# Patient Record
Sex: Female | Born: 1950 | Race: Black or African American | Hispanic: No | Marital: Single | Smoking: Never smoker
Health system: Southern US, Community
[De-identification: ages and names within clinical notes are randomized; demographics above are authoritative.]

## PROBLEM LIST (undated history)

## (undated) ENCOUNTER — Emergency Department: Discharge status: Absent without leave | Payer: Medicare Other

## (undated) DIAGNOSIS — Z96652 Presence of left artificial knee joint: Secondary | ICD-10-CM

## (undated) DIAGNOSIS — R011 Cardiac murmur, unspecified: Secondary | ICD-10-CM

## (undated) DIAGNOSIS — IMO0002 Reserved for concepts with insufficient information to code with codable children: Secondary | ICD-10-CM

## (undated) DIAGNOSIS — K219 Gastro-esophageal reflux disease without esophagitis: Secondary | ICD-10-CM

## (undated) DIAGNOSIS — R002 Palpitations: Secondary | ICD-10-CM

## (undated) DIAGNOSIS — D491 Neoplasm of unspecified behavior of respiratory system: Secondary | ICD-10-CM

## (undated) DIAGNOSIS — G4730 Sleep apnea, unspecified: Secondary | ICD-10-CM

## (undated) DIAGNOSIS — R0681 Apnea, not elsewhere classified: Secondary | ICD-10-CM

## (undated) DIAGNOSIS — L299 Pruritus, unspecified: Secondary | ICD-10-CM

## (undated) DIAGNOSIS — Z124 Encounter for screening for malignant neoplasm of cervix: Secondary | ICD-10-CM

## (undated) DIAGNOSIS — Z021 Encounter for pre-employment examination: Secondary | ICD-10-CM

## (undated) DIAGNOSIS — F259 Schizoaffective disorder, unspecified: Secondary | ICD-10-CM

## (undated) DIAGNOSIS — R32 Unspecified urinary incontinence: Secondary | ICD-10-CM

## (undated) DIAGNOSIS — M25661 Stiffness of right knee, not elsewhere classified: Secondary | ICD-10-CM

## (undated) DIAGNOSIS — Z7401 Bed confinement status: Secondary | ICD-10-CM

## (undated) DIAGNOSIS — F419 Anxiety disorder, unspecified: Secondary | ICD-10-CM

## (undated) DIAGNOSIS — Z709 Sex counseling, unspecified: Secondary | ICD-10-CM

## (undated) DIAGNOSIS — E785 Hyperlipidemia, unspecified: Secondary | ICD-10-CM

## (undated) DIAGNOSIS — M069 Rheumatoid arthritis, unspecified: Secondary | ICD-10-CM

## (undated) DIAGNOSIS — I639 Cerebral infarction, unspecified: Secondary | ICD-10-CM

## (undated) DIAGNOSIS — R61 Generalized hyperhidrosis: Secondary | ICD-10-CM

## (undated) DIAGNOSIS — R569 Unspecified convulsions: Secondary | ICD-10-CM

## (undated) DIAGNOSIS — M797 Fibromyalgia: Secondary | ICD-10-CM

## (undated) DIAGNOSIS — I10 Essential (primary) hypertension: Secondary | ICD-10-CM

## (undated) DIAGNOSIS — M25662 Stiffness of left knee, not elsewhere classified: Secondary | ICD-10-CM

## (undated) HISTORY — PX: CYST REMOVAL HAND: SHX6279

## (undated) HISTORY — DX: Hyperlipidemia, unspecified: E78.5

## (undated) HISTORY — DX: Schizoaffective disorder, unspecified: F25.9

## (undated) HISTORY — DX: Neoplasm of unspecified behavior of respiratory system: D49.1

## (undated) HISTORY — DX: Rheumatoid arthritis, unspecified: M06.9

## (undated) HISTORY — DX: Cardiac murmur, unspecified: R01.1

## (undated) HISTORY — DX: Fibromyalgia: M79.7

---

## 1975-09-10 HISTORY — PX: LUNG LOBECTOMY: SHX167

## 2006-08-21 ENCOUNTER — Ambulatory Visit: Payer: Self-pay | Admitting: Internal Medicine

## 2006-08-26 ENCOUNTER — Ambulatory Visit: Payer: Self-pay | Admitting: Internal Medicine

## 2006-09-09 HISTORY — PX: CARDIAC CATHETERIZATION: SHX172

## 2007-11-04 ENCOUNTER — Ambulatory Visit: Payer: Self-pay | Admitting: Family Medicine

## 2008-09-05 ENCOUNTER — Emergency Department: Payer: Self-pay | Admitting: Emergency Medicine

## 2008-11-04 ENCOUNTER — Emergency Department: Payer: Self-pay | Admitting: Emergency Medicine

## 2009-01-23 ENCOUNTER — Ambulatory Visit: Payer: Self-pay | Admitting: Family Medicine

## 2009-07-01 ENCOUNTER — Emergency Department: Payer: Self-pay | Admitting: Emergency Medicine

## 2010-02-18 ENCOUNTER — Emergency Department: Payer: Self-pay | Admitting: Internal Medicine

## 2010-02-20 ENCOUNTER — Ambulatory Visit: Payer: Self-pay | Admitting: Family Medicine

## 2010-06-01 ENCOUNTER — Emergency Department: Payer: Self-pay | Admitting: Emergency Medicine

## 2010-09-09 HISTORY — PX: BRAIN TUMOR EXCISION: SHX577

## 2010-12-17 ENCOUNTER — Emergency Department: Payer: Self-pay | Admitting: Emergency Medicine

## 2010-12-20 ENCOUNTER — Inpatient Hospital Stay: Payer: Self-pay | Admitting: Psychiatry

## 2011-03-18 ENCOUNTER — Emergency Department: Payer: Self-pay | Admitting: *Deleted

## 2011-03-21 ENCOUNTER — Emergency Department: Payer: Self-pay | Admitting: Unknown Physician Specialty

## 2011-04-22 ENCOUNTER — Emergency Department: Payer: Self-pay | Admitting: Emergency Medicine

## 2011-05-17 ENCOUNTER — Emergency Department: Payer: Self-pay | Admitting: Unknown Physician Specialty

## 2011-08-06 ENCOUNTER — Inpatient Hospital Stay: Payer: Self-pay | Admitting: Psychiatry

## 2011-11-08 ENCOUNTER — Emergency Department: Payer: Self-pay | Admitting: Emergency Medicine

## 2011-11-08 LAB — COMPREHENSIVE METABOLIC PANEL
Albumin: 4.2 g/dL (ref 3.4–5.0)
Alkaline Phosphatase: 79 U/L (ref 50–136)
Anion Gap: 8 (ref 7–16)
BUN: 17 mg/dL (ref 7–18)
Bilirubin,Total: 0.3 mg/dL (ref 0.2–1.0)
Calcium, Total: 9.3 mg/dL (ref 8.5–10.1)
Chloride: 105 mmol/L (ref 98–107)
Co2: 28 mmol/L (ref 21–32)
Creatinine: 0.78 mg/dL (ref 0.60–1.30)
EGFR (African American): >60
EGFR (Non-African Amer.): >60
Glucose: 106 mg/dL — ABNORMAL HIGH (ref 65–99)
Osmolality: 283 (ref 275–301)
Potassium: 3.4 mmol/L — ABNORMAL LOW (ref 3.5–5.1)
SGOT(AST): 26 U/L (ref 15–37)
SGPT (ALT): 21 U/L
Sodium: 141 mmol/L (ref 136–145)
Total Protein: 7.9 g/dL (ref 6.4–8.2)

## 2011-11-08 LAB — CBC
HCT: 40.5 % (ref 35.0–47.0)
HGB: 13.6 g/dL (ref 12.0–16.0)
MCH: 31.2 pg (ref 26.0–34.0)
MCHC: 33.6 g/dL (ref 32.0–36.0)
MCV: 93 fL (ref 80–100)
Platelet: 268 10*3/uL (ref 150–440)
RBC: 4.36 10*6/uL (ref 3.80–5.20)
RDW: 16.2 % — ABNORMAL HIGH (ref 11.5–14.5)
WBC: 6.9 10*3/uL (ref 3.6–11.0)

## 2011-11-08 LAB — DRUG SCREEN, URINE

## 2011-11-08 LAB — URINALYSIS, COMPLETE
Bilirubin,UR: NEGATIVE
Blood: NEGATIVE
Glucose,UR: NEGATIVE mg/dL (ref 0–75)
Ketone: NEGATIVE
Leukocyte Esterase: NEGATIVE
Nitrite: NEGATIVE
Ph: 5 (ref 4.5–8.0)
Protein: NEGATIVE
RBC,UR: 2 /HPF (ref 0–5)
Specific Gravity: 1.026 (ref 1.003–1.030)
Squamous Epithelial: 1
WBC UR: <1 /HPF (ref 0–5)

## 2011-11-08 LAB — ETHANOL
Ethanol %: <0.003 % (ref 0.000–0.080)
Ethanol: <3 mg/dL

## 2011-11-08 LAB — TSH: Thyroid Stimulating Horm: 1.15 u[IU]/mL

## 2012-02-26 ENCOUNTER — Emergency Department (HOSPITAL_COMMUNITY)
Admission: EM | Admit: 2012-02-26 | Discharge: 2012-02-26 | Disposition: A | Discharge status: Home / Self Care | Payer: Self-pay | Attending: Emergency Medicine | Admitting: Emergency Medicine

## 2012-02-26 ENCOUNTER — Encounter (HOSPITAL_COMMUNITY): Payer: Self-pay

## 2012-02-26 ENCOUNTER — Emergency Department (HOSPITAL_COMMUNITY): Payer: Self-pay

## 2012-02-26 DIAGNOSIS — I10 Essential (primary) hypertension: Secondary | ICD-10-CM | POA: Insufficient documentation

## 2012-02-26 DIAGNOSIS — E119 Type 2 diabetes mellitus without complications: Secondary | ICD-10-CM | POA: Insufficient documentation

## 2012-02-26 DIAGNOSIS — R300 Dysuria: Secondary | ICD-10-CM | POA: Insufficient documentation

## 2012-02-26 HISTORY — DX: Anxiety disorder, unspecified: F41.9

## 2012-02-26 HISTORY — DX: Essential (primary) hypertension: I10

## 2012-02-26 HISTORY — DX: Reserved for concepts with insufficient information to code with codable children: IMO0002

## 2012-02-26 LAB — BASIC METABOLIC PANEL
BUN: 20 mg/dL (ref 6–23)
CO2: 26 mEq/L (ref 19–32)
Calcium: 9.8 mg/dL (ref 8.4–10.5)
Chloride: 101 mEq/L (ref 96–112)
Creatinine, Ser: 0.76 mg/dL (ref 0.50–1.10)
GFR calc Af Amer: >90 mL/min (ref >90)
GFR calc non Af Amer: >90 mL/min (ref >90)
Glucose, Bld: 102 mg/dL — ABNORMAL HIGH (ref 70–99)
Potassium: 4.1 mEq/L (ref 3.5–5.1)
Sodium: 136 mEq/L (ref 135–145)

## 2012-02-26 LAB — URINALYSIS, ROUTINE W REFLEX MICROSCOPIC
Bilirubin Urine: NEGATIVE
Glucose, UA: NEGATIVE mg/dL
Hgb urine dipstick: NEGATIVE
Ketones, ur: NEGATIVE mg/dL
Leukocytes, UA: NEGATIVE
Nitrite: NEGATIVE
Protein, ur: NEGATIVE mg/dL
Specific Gravity, Urine: 1.01 (ref 1.005–1.030)
Urobilinogen, UA: 0.2 mg/dL (ref 0.0–1.0)
pH: 6 (ref 5.0–8.0)

## 2012-02-26 NOTE — ED Notes (Signed)
Pt has not been in room since before 11 am.  Pt's medications were left in room.  Eugene Gavia attempted to call number listed on chart but was wrong number.  Took pt's medications to pharmacy, counted pills with pharmacist and left medications with pharmacist to lock up.

## 2012-02-26 NOTE — ED Notes (Signed)
Patient has been missing for about 45 minutes. RN Verlon Au aware. Medications were given to Diplomatic Services operational officer.

## 2012-02-26 NOTE — ED Notes (Signed)
Pt reports dysuria and some urinary retention for the past few days.  Pt states "i don't feel like I'm drinking enough water".

## 2012-02-26 NOTE — ED Provider Notes (Signed)
History     CSN: [id number]  Arrival date & time 02/26/12  0910   First MD Initiated Contact with Patient 02/26/12 [phone]      Chief Complaint  Patient presents with  . Dysuria  . Urinary Retention    (Consider location/radiation/quality/duration/timing/severity/associated sxs/prior treatment) HPI Comments: Lauren [name] presents for evaluation of left back pressure sensation and burning pain with urination in addition to increased urinary frequency, yet she states she is only passing small amounts of urine.  She denies fevers,  Chills,  Nausea,  Vomiting.  She also denies abdominal pain, and states her low back pain is constant and not worse with movement or palpation.  She is concerned for a possible uti, and states she may not be drinking enough fluids.  She is diabetic and does not check her cbg's on a regular basis.  Patient is a 61 y.o. female presenting with dysuria. The history is provided by the patient.  Dysuria  Pertinent negatives include no nausea.    Past Medical History  Diagnosis Date  . Hypertension   . Diabetes mellitus   . Herniated disc   . Anxiety     Past Surgical History  Procedure Date  . Brain tumor excision   . Lung lobectomy   . Cesarean section     No family history on file.  History  Substance Use Topics  . Smoking status: Never Smoker   . Smokeless tobacco: Not on file  . Alcohol Use: No    OB History    Grav Para Term Preterm Abortions TAB SAB Ect Mult Living                  Review of Systems  Constitutional: Negative for fever.  HENT: Negative for congestion, sore throat and neck pain.   Eyes: Negative.   Respiratory: Negative for chest tightness and shortness of breath.   Cardiovascular: Negative for chest pain.  Gastrointestinal: Negative for nausea and abdominal pain.  Genitourinary: Positive for dysuria. Negative for vaginal discharge.  Musculoskeletal: Positive for back pain. Negative for joint swelling and  arthralgias.  Skin: Negative.  Negative for rash and wound.  Neurological: Negative for dizziness, weakness, light-headedness, numbness and headaches.  Hematological: Negative.   Psychiatric/Behavioral: Negative.     Allergies  Percocet and Vicodin  Home Medications   Current Outpatient Rx  Name Route Sig Dispense Refill  . BC HEADACHE POWDER PO Oral Take 1 packet by mouth daily as needed. For headache and pain    . ATENOLOL 100 MG PO TABS Oral Take 100 mg by mouth daily.    [name] DICLOFENAC SODIUM 75 MG PO TBEC Oral Take 75 mg by mouth 2 (two) times daily.    [name] DIPHENHYDRAMINE HCL 25 MG PO TABS Oral Take 25 mg by mouth at bedtime as needed. For allergies    . LISINOPRIL 20 MG PO TABS Oral Take 20 mg by mouth daily.    . MELOXICAM 15 MG PO TABS Oral Take 15 mg by mouth daily.    [name] METFORMIN HCL 500 MG PO TABS Oral Take 500 mg by mouth 2 (two) times daily with a meal.    . RISPERIDONE 1 MG PO TABS Oral Take 1 mg by mouth 2 (two) times daily.    [name] METHOCARBAMOL 500 MG PO TABS Oral Take 500 mg by mouth 2 (two) times daily as needed.      BP 179/115  Pulse 71  Temp 98.2 F (36.8 C) (Oral)  Resp 18  Ht 5\' 6"  (1.676 m)  Wt 232 lb (105.235 kg)  BMI 37.45 kg/m2  SpO2 100%  Physical Exam  Nursing note and vitals reviewed. Constitutional: She appears well-developed and well-nourished.       Hypertensive.  HENT:  Head: Normocephalic and atraumatic.  Eyes: Conjunctivae are normal.  Neck: Normal range of motion.  Cardiovascular: Normal rate, regular rhythm, normal heart sounds and intact distal pulses.   Pulmonary/Chest: Effort normal and breath sounds normal. She has no wheezes.  Abdominal: Soft. Bowel sounds are normal. There is no tenderness. There is no rebound and no guarding.  Musculoskeletal: Normal range of motion. She exhibits tenderness. She exhibits no edema.       Back:  Neurological: She is alert.  Skin: Skin is warm and dry.  Psychiatric: She has a normal mood and  affect.    ED Course  Procedures (including critical care time)  Labs Reviewed  BASIC METABOLIC PANEL - Abnormal; Notable for the following:    Glucose, Bld 102 (*)     All other components within normal limits  URINALYSIS, ROUTINE W REFLEX MICROSCOPIC  URINE CULTURE   No results found.   1. Dysuria       MDM  While awaiting pt to have lumbar spine xray,  She left dept without informing anyone.  She was given results of her urinalysis,  But had not received normal bmet results.  Pt had not yet had LS spine film.        Burgess Amor, [place] 02/26/12 1708

## 2012-02-27 LAB — URINE CULTURE
Colony Count: 8000
Culture  Setup Time: 201306200128

## 2012-02-27 NOTE — ED Provider Notes (Signed)
Medical screening examination/treatment/procedure(s) were performed by non-physician practitioner and as supervising physician I was immediately available for consultation/collaboration.   Joya Gaskins, MD 02/27/12 [phone]

## 2012-06-04 ENCOUNTER — Emergency Department: Payer: Self-pay | Admitting: Emergency Medicine

## 2012-06-05 LAB — COMPREHENSIVE METABOLIC PANEL
Albumin: 3.2 g/dL — ABNORMAL LOW (ref 3.4–5.0)
Alkaline Phosphatase: 107 U/L (ref 50–136)
Anion Gap: 11 (ref 7–16)
BUN: 9 mg/dL (ref 7–18)
Bilirubin,Total: 0.2 mg/dL (ref 0.2–1.0)
Calcium, Total: 8.6 mg/dL (ref 8.5–10.1)
Chloride: 104 mmol/L (ref 98–107)
Co2: 26 mmol/L (ref 21–32)
Creatinine: 0.91 mg/dL (ref 0.60–1.30)
EGFR (African American): >60
EGFR (Non-African Amer.): >60
Glucose: 156 mg/dL — ABNORMAL HIGH (ref 65–99)
Osmolality: 283 (ref 275–301)
Potassium: 3.6 mmol/L (ref 3.5–5.1)
SGOT(AST): 31 U/L (ref 15–37)
SGPT (ALT): 35 U/L (ref 12–78)
Sodium: 141 mmol/L (ref 136–145)
Total Protein: 7.2 g/dL (ref 6.4–8.2)

## 2012-06-05 LAB — DRUG SCREEN, URINE

## 2012-06-05 LAB — CBC WITH DIFFERENTIAL/PLATELET
Basophil #: 0.1 10*3/uL (ref 0.0–0.1)
Basophil %: 1 %
Eosinophil #: 0.1 10*3/uL (ref 0.0–0.7)
Eosinophil %: 1.9 %
HCT: 35.1 % (ref 35.0–47.0)
HGB: 12.2 g/dL (ref 12.0–16.0)
Lymphocyte #: 2.7 10*3/uL (ref 1.0–3.6)
Lymphocyte %: 36.4 %
MCH: 32.9 pg (ref 26.0–34.0)
MCHC: 34.9 g/dL (ref 32.0–36.0)
MCV: 94 fL (ref 80–100)
Monocyte #: 0.6 x10 3/mm (ref 0.2–0.9)
Monocyte %: 8.5 %
Neutrophil #: 3.9 10*3/uL (ref 1.4–6.5)
Neutrophil %: 52.2 %
Platelet: 309 10*3/uL (ref 150–440)
RBC: 3.72 10*6/uL — ABNORMAL LOW (ref 3.80–5.20)
RDW: 14.1 % (ref 11.5–14.5)
WBC: 7.4 10*3/uL (ref 3.6–11.0)

## 2013-08-25 ENCOUNTER — Ambulatory Visit: Payer: Self-pay | Admitting: Internal Medicine

## 2013-12-09 ENCOUNTER — Ambulatory Visit: Payer: Self-pay | Admitting: Unknown Physician Specialty

## 2013-12-10 LAB — PATHOLOGY REPORT

## 2014-01-24 ENCOUNTER — Ambulatory Visit: Payer: Self-pay | Admitting: Obstetrics & Gynecology

## 2014-01-24 LAB — CBC
HCT: 39.9 % (ref 35.0–47.0)
HGB: 13.2 g/dL (ref 12.0–16.0)
MCH: 31.7 pg (ref 26.0–34.0)
MCHC: 33 g/dL (ref 32.0–36.0)
MCV: 96 fL (ref 80–100)
Platelet: 254 10*3/uL (ref 150–440)
RBC: 4.16 10*6/uL (ref 3.80–5.20)
RDW: 14.7 % — ABNORMAL HIGH (ref 11.5–14.5)
WBC: 6.2 10*3/uL (ref 3.6–11.0)

## 2014-01-24 LAB — BASIC METABOLIC PANEL
Anion Gap: 5 — ABNORMAL LOW (ref 7–16)
BUN: 13 mg/dL (ref 7–18)
Calcium, Total: 9.2 mg/dL (ref 8.5–10.1)
Chloride: 100 mmol/L (ref 98–107)
Co2: 32 mmol/L (ref 21–32)
Creatinine: 0.68 mg/dL (ref 0.60–1.30)
EGFR (African American): >60
EGFR (Non-African Amer.): >60
Glucose: 88 mg/dL (ref 65–99)
Osmolality: 273 (ref 275–301)
Potassium: 3.6 mmol/L (ref 3.5–5.1)
Sodium: 137 mmol/L (ref 136–145)

## 2014-01-24 LAB — PROTIME-INR
INR: 1
Prothrombin Time: 12.7 secs (ref 11.5–14.7)

## 2014-01-24 LAB — APTT: Activated PTT: 31.8 secs (ref 23.6–35.9)

## 2014-02-02 DIAGNOSIS — R002 Palpitations: Secondary | ICD-10-CM | POA: Insufficient documentation

## 2014-02-02 DIAGNOSIS — G4730 Sleep apnea, unspecified: Secondary | ICD-10-CM

## 2014-02-02 DIAGNOSIS — R0681 Apnea, not elsewhere classified: Secondary | ICD-10-CM

## 2014-02-02 DIAGNOSIS — G4733 Obstructive sleep apnea (adult) (pediatric): Secondary | ICD-10-CM | POA: Insufficient documentation

## 2014-02-02 HISTORY — DX: Palpitations: R00.2

## 2014-02-02 HISTORY — DX: Apnea, not elsewhere classified: R06.81

## 2014-02-02 HISTORY — DX: Sleep apnea, unspecified: G47.30

## 2014-02-03 ENCOUNTER — Ambulatory Visit: Payer: Self-pay | Admitting: Obstetrics & Gynecology

## 2014-02-04 LAB — PATHOLOGY REPORT

## 2014-12-06 DIAGNOSIS — G4700 Insomnia, unspecified: Secondary | ICD-10-CM | POA: Diagnosis not present

## 2014-12-06 DIAGNOSIS — G4730 Sleep apnea, unspecified: Secondary | ICD-10-CM | POA: Diagnosis not present

## 2014-12-06 DIAGNOSIS — E785 Hyperlipidemia, unspecified: Secondary | ICD-10-CM | POA: Diagnosis not present

## 2014-12-06 DIAGNOSIS — R7309 Other abnormal glucose: Secondary | ICD-10-CM | POA: Diagnosis not present

## 2014-12-06 DIAGNOSIS — I10 Essential (primary) hypertension: Secondary | ICD-10-CM | POA: Diagnosis not present

## 2014-12-07 DIAGNOSIS — G4733 Obstructive sleep apnea (adult) (pediatric): Secondary | ICD-10-CM | POA: Diagnosis not present

## 2014-12-09 DIAGNOSIS — G4733 Obstructive sleep apnea (adult) (pediatric): Secondary | ICD-10-CM | POA: Diagnosis not present

## 2014-12-31 NOTE — Op Note (Signed)
PATIENT NAME:  Lauren [name], Lauren [name] MR#:  [id number] DATE OF BIRTH:  01/16/1951  DATE OF PROCEDURE:  02/03/2014  PREOPERATIVE DIAGNOSIS: Postmenopausal bleeding and abnormal endometrium.  POSTOPERATIVE DIAGNOSIS: Postmenopausal bleeding and abnormal endometrium with polyp.   PROCEDURE: Hysteroscopy, dilation and curettage with polypectomy.   SURGEON: Glean Salen, MD  ANESTHESIA: General.   ESTIMATED BLOOD LOSS: Minimal.   COMPLICATIONS: None.   FINDINGS: There is a polyp in the lower uterine segment to cervix. The endometrium was otherwise thin and atrophic. Bilateral ostia are visualized.   SPECIMEN: Endocervical curettage, endometrial curettage and polyp specimens.   DISPOSITION: To recovery room in stable condition.   TECHNIQUE: The patient is prepped and draped in the usual sterile fashion after adequate anesthesia is obtained in the dorsal lithotomy position. Bladder is drained with a Robinson catheter. Speculum is placed, and the anterior lip of the cervix is grasped with a tenaculum. Endocervical curettage is performed with a Kevorkian curette. The uterus is sounded to 5 cm with a uterine sound. The cervix is easily dilated with Kennon Rounds dilators. A 30-degree hysteroscope with saline distention in the intrauterine cavity is performed, with the above-mentioned findings noted. The hysteroscope is removed, with minimal discrepancy of fluid. A polypectomy forceps is inserted, with removal of the polyp. A curettage of the endometrium was performed with a banjo curette. Excellent hemostasis noted, including after the removal of the tenaculum. The patient goes to recovery room in stable condition. All sponge, instrument and needle counts are correct.   ____________________________ R. Barnett Applebaum, MD rph:lb D: 02/03/2014 08:07:27 ET T: 02/03/2014 08:33:35 ET JOB#: [id number]  cc: Glean Salen, MD, <Dictator> Gae Dry MD ELECTRONICALLY SIGNED 02/03/2014 15:23

## 2015-01-01 NOTE — Consult Note (Signed)
Brief Consult Note: Diagnosis: schizophrenia.   Patient was seen by consultant.   Consult note dictated.   Discussed with Attending MD.   Comments: Psychiatry: Patient seen. Chart reviewed. Patient has schizophrenia and has some odd delusions about bodily sensations but she is in no way threatening and denies suicidal ideation. Currently calm. Agrees to continue her medication. Has a place to live and good outpt support. Currently does not appear dangerous and not commitable. Will release IVC and confirm outpt followup.  Electronic Signatures: Gonzella Lex (MD)  (Signed 01-Mar-13 13:07)  Authored: Brief Consult Note   Last Updated: 01-Mar-13 13:07 by Gonzella Lex (MD)
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
## 2015-01-18 DIAGNOSIS — G4733 Obstructive sleep apnea (adult) (pediatric): Secondary | ICD-10-CM | POA: Diagnosis not present

## 2015-03-08 ENCOUNTER — Ambulatory Visit: Payer: Medicare Other | Admitting: Family Medicine

## 2015-03-21 ENCOUNTER — Ambulatory Visit (INDEPENDENT_AMBULATORY_CARE_PROVIDER_SITE_OTHER): Payer: Medicare Other | Admitting: Family Medicine

## 2015-03-21 ENCOUNTER — Encounter: Payer: Self-pay | Admitting: Family Medicine

## 2015-03-21 VITALS — BP 135/77 | HR 77 | Temp 98.7°F | Resp 17 | Ht 62.0 in | Wt 273.1 lb

## 2015-03-21 DIAGNOSIS — G4700 Insomnia, unspecified: Secondary | ICD-10-CM | POA: Diagnosis not present

## 2015-03-21 DIAGNOSIS — G4730 Sleep apnea, unspecified: Secondary | ICD-10-CM | POA: Diagnosis not present

## 2015-03-21 DIAGNOSIS — I10 Essential (primary) hypertension: Secondary | ICD-10-CM | POA: Insufficient documentation

## 2015-03-21 DIAGNOSIS — E785 Hyperlipidemia, unspecified: Secondary | ICD-10-CM | POA: Insufficient documentation

## 2015-03-21 DIAGNOSIS — N3941 Urge incontinence: Secondary | ICD-10-CM | POA: Insufficient documentation

## 2015-03-21 DIAGNOSIS — F419 Anxiety disorder, unspecified: Secondary | ICD-10-CM | POA: Insufficient documentation

## 2015-03-21 NOTE — Progress Notes (Signed)
Name: Lauren [name]   MRN: [id number]    DOB: 1951-08-11   Date:03/21/2015       Progress Note  Subjective  Chief Complaint  Chief Complaint  Patient presents with  . Follow-up    Paperwork C-PAP  . Hypertension  . Hyperlipidemia    HPI Pt. here to reauthorize the continuation of CPAP supplies. She is using CPAP for sleep apnea. She reports using the CPAP every night and reports improvement in her energy level. When she does not use the CPAP, she wakes up gasping for air and has noticed fluttering of her heart, which does not happen when she is using the CPAP. SHe was diagnosed with sleep apnea in February/March 2016.   Past Medical History  Diagnosis Date  . Hypertension   . Diabetes mellitus   . Herniated disc   . Anxiety     Past Surgical History  Procedure Laterality Date  . Brain tumor excision    . Lung lobectomy    . Cesarean section      History reviewed. No pertinent family history.  History   Social History  . Marital Status: Single    Spouse Name: N/A  . Number of Children: N/A  . Years of Education: N/A   Occupational History  . Not on file.   Social History Main Topics  . Smoking status: Never Smoker   . Smokeless tobacco: Not on file  . Alcohol Use: No  . Drug Use: No  . Sexual Activity: Not on file   Other Topics Concern  . Not on file   Social History Narrative     Current outpatient prescriptions:  .  Aspirin-Salicylamide-Caffeine (BC HEADACHE POWDER PO), Take 1 packet by mouth daily as needed. For headache and pain, Disp: , Rfl:  .  atenolol (TENORMIN) 100 MG tablet, Take 100 mg by mouth daily., Disp: , Rfl:  .  atorvastatin (LIPITOR) 20 MG tablet, Take 1 tablet by mouth at bedtime., Disp: , Rfl:  .  haloperidol (HALDOL) 2 MG tablet, Take 1 tablet by mouth at bedtime., Disp: , Rfl:  .  lisinopril-hydrochlorothiazide (PRINZIDE,ZESTORETIC) 20-25 MG per tablet, Take 1 tablet by mouth daily., Disp: , Rfl:  .  Melatonin 10 MG TABS,  Take 1 tablet by mouth as needed., Disp: , Rfl:  .  omeprazole (PRILOSEC) 20 MG capsule, Take 1 capsule by mouth daily., Disp: , Rfl:   Allergies  Allergen Reactions  . Percocet [Oxycodone-Acetaminophen] Nausea And Vomiting, Diarrhea and Nausea Only  . Tramadol Hcl Diarrhea, Nausea Only and Nausea And Vomiting  . Vicodin [Hydrocodone-Acetaminophen] Nausea And Vomiting, Diarrhea and Nausea Only     Review of Systems  Constitutional: Negative for malaise/fatigue.  Respiratory: Positive for shortness of breath. Negative for cough.   Cardiovascular: Negative for chest pain.  Gastrointestinal: Negative for abdominal pain.  Neurological: Negative for headaches.     Objective  Filed Vitals:   03/21/15 1356  BP: 135/77  Pulse: 77  Temp: 98.7 F (37.1 C)  TempSrc: Oral  Resp: 17  Height: 5\' 2"  (1.575 m)  Weight: 273 lb 1.6 oz (123.877 kg)  SpO2: 94%    Physical Exam  Constitutional: She is oriented to person, place, and time and well-developed, well-nourished, and in no distress. Vital signs are normal.  HENT:  Head: Normocephalic and atraumatic.  Cardiovascular: Normal rate and regular rhythm.   Pulmonary/Chest: Effort normal and breath sounds normal.  Neurological: She is alert and oriented to person, place, and time.  Skin: Skin is warm.  Psychiatric: Affect normal.  Nursing note and vitals reviewed.    Assessment & Plan 1. Insomnia w/ sleep apnea Pt. Is reportedly using the CPAP every night and has noticed significant improvement in her sleep quality (is sleeping 6hrs a night on average vs 4 hrs prior to CPAP) and in her energy level. Recertification paperwork completed and signed.   Nikita Humble Asad A. [facility] 03/21/2015 2:22 PM

## 2015-04-04 ENCOUNTER — Telehealth: Payer: Self-pay | Admitting: Family Medicine

## 2015-04-04 ENCOUNTER — Other Ambulatory Visit: Payer: Self-pay | Admitting: Family Medicine

## 2015-04-04 MED ORDER — ATENOLOL 100 MG PO TABS: 100.0000 mg | ORAL_TABLET | Freq: Every day | ORAL | Status: DC

## 2015-04-04 MED ORDER — LISINOPRIL-HYDROCHLOROTHIAZIDE 20-25 MG PO TABS: 1.0000 | ORAL_TABLET | Freq: Every day | ORAL | Status: DC

## 2015-04-04 MED ORDER — ATORVASTATIN CALCIUM 20 MG PO TABS: 20.0000 mg | ORAL_TABLET | Freq: Every day | ORAL | Status: DC

## 2015-04-04 MED ORDER — OMEPRAZOLE 20 MG PO CPDR: 20.0000 mg | DELAYED_RELEASE_CAPSULE | Freq: Every day | ORAL | Status: DC

## 2015-04-04 NOTE — Telephone Encounter (Signed)
PT SAID THAT SHE NEEDS REFILL ON BLOOD PRESSURE, STOMACH (ACID REFLUX), CHOLESTEROL MEDS . PHARM IS [facility]. DR TOLD HER TO JUST CALL us TO REFILL.

## 2015-04-04 NOTE — Telephone Encounter (Signed)
Medication has been refilled and sent to [facility]

## 2015-04-04 NOTE — Telephone Encounter (Signed)
Medication has been refilled and sent to [facility].

## 2015-06-14 DIAGNOSIS — G4733 Obstructive sleep apnea (adult) (pediatric): Secondary | ICD-10-CM | POA: Diagnosis not present

## 2015-06-16 ENCOUNTER — Ambulatory Visit: Payer: Medicare Other | Admitting: Family Medicine

## 2015-06-21 ENCOUNTER — Ambulatory Visit: Payer: Medicare Other | Admitting: Family Medicine

## 2015-06-22 ENCOUNTER — Ambulatory Visit
Admission: RE | Admit: 2015-06-22 | Discharge: 2015-06-22 | Disposition: A | Discharge status: Home / Self Care | Payer: Medicare Other | Source: Ambulatory Visit | Attending: Family Medicine | Admitting: Family Medicine

## 2015-06-22 ENCOUNTER — Encounter: Payer: Self-pay | Admitting: Family Medicine

## 2015-06-22 ENCOUNTER — Ambulatory Visit (INDEPENDENT_AMBULATORY_CARE_PROVIDER_SITE_OTHER): Payer: Medicare Other | Admitting: Family Medicine

## 2015-06-22 VITALS — BP 136/78 | HR 78 | Temp 98.3°F | Resp 19 | Ht 62.0 in | Wt 275.0 lb

## 2015-06-22 DIAGNOSIS — M25661 Stiffness of right knee, not elsewhere classified: Secondary | ICD-10-CM

## 2015-06-22 DIAGNOSIS — M25662 Stiffness of left knee, not elsewhere classified: Secondary | ICD-10-CM

## 2015-06-22 DIAGNOSIS — M25562 Pain in left knee: Secondary | ICD-10-CM | POA: Insufficient documentation

## 2015-06-22 DIAGNOSIS — Z23 Encounter for immunization: Secondary | ICD-10-CM | POA: Diagnosis not present

## 2015-06-22 DIAGNOSIS — M25462 Effusion, left knee: Secondary | ICD-10-CM | POA: Diagnosis not present

## 2015-06-22 DIAGNOSIS — M25561 Pain in right knee: Secondary | ICD-10-CM | POA: Insufficient documentation

## 2015-06-22 DIAGNOSIS — M179 Osteoarthritis of knee, unspecified: Secondary | ICD-10-CM | POA: Diagnosis not present

## 2015-06-22 DIAGNOSIS — M1711 Unilateral primary osteoarthritis, right knee: Secondary | ICD-10-CM

## 2015-06-22 HISTORY — DX: Stiffness of right knee, not elsewhere classified: M25.661

## 2015-06-22 HISTORY — DX: Stiffness of left knee, not elsewhere classified: M25.662

## 2015-06-22 NOTE — Progress Notes (Signed)
Name: Lauren [name]   MRN: [id number]    DOB: [birth year]   Date:06/22/2015       Progress Note  Subjective  Chief Complaint  Chief Complaint  Patient presents with  . Follow-up    Medication / Paperwork / Knees  . Hyperlipidemia  . Anxiety  . Hypertension   Knee Pain  There was no injury mechanism. The pain is present in the right knee and left knee. The pain is at a severity of 5/10. The pain is moderate. Pertinent negatives include no inability to bear weight or loss of motion. Associated symptoms comments: Pt. States that at certain times, 'my legs give out on me' and 'I find it hard to climb steps'. She reports no foreign bodies present. She has tried NSAIDs for the symptoms. The treatment provided moderate relief.   Past Medical History  Diagnosis Date  . Hypertension   . Diabetes mellitus   . Herniated disc   . Anxiety     Past Surgical History  Procedure Laterality Date  . Brain tumor excision    . Lung lobectomy    . Cesarean section      History reviewed. No pertinent family history.  Social History   Social History  . Marital Status: Single    Spouse Name: N/A  . Number of Children: N/A  . Years of Education: N/A   Occupational History  . Not on file.   Social History Main Topics  . Smoking status: Never Smoker   . Smokeless tobacco: Not on file  . Alcohol Use: No  . Drug Use: No  . Sexual Activity: Not on file   Other Topics Concern  . Not on file   Social History Narrative    Current outpatient prescriptions:  .  aspirin EC 81 MG tablet, Take by mouth., Disp: , Rfl:  .  Aspirin-Salicylamide-Caffeine (BC HEADACHE POWDER PO), Take 1 packet by mouth daily as needed. For headache and pain, Disp: , Rfl:  .  atenolol (TENORMIN) 100 MG tablet, Take 1 tablet (100 mg total) by mouth daily., Disp: 90 tablet, Rfl: 0 .  atorvastatin (LIPITOR) 20 MG tablet, Take 1 tablet (20 mg total) by mouth at bedtime., Disp: 90 tablet, Rfl: 0 .  haloperidol  (HALDOL) 2 MG tablet, Take 1 tablet by mouth at bedtime., Disp: , Rfl:  .  lisinopril-hydrochlorothiazide (PRINZIDE,ZESTORETIC) 20-25 MG per tablet, Take 1 tablet by mouth daily., Disp: 90 tablet, Rfl: 0 .  Melatonin 10 MG TABS, Take 1 tablet by mouth as needed., Disp: , Rfl:  .  omeprazole (PRILOSEC) 20 MG capsule, Take 1 capsule (20 mg total) by mouth daily., Disp: 90 capsule, Rfl: 0  Allergies  Allergen Reactions  . Percocet [Oxycodone-Acetaminophen] Nausea And Vomiting, Diarrhea and Nausea Only  . Tramadol Hcl Diarrhea, Nausea Only and Nausea And Vomiting  . Vicodin [Hydrocodone-Acetaminophen] Nausea And Vomiting, Diarrhea and Nausea Only   Review of Systems  Constitutional: Positive for chills.  Musculoskeletal: Positive for joint pain.    Objective  Filed Vitals:   06/22/15 1101  BP: 136/78  Pulse: 78  Temp: 98.3 F (36.8 C)  TempSrc: Oral  Resp: 19  Height: 5\' 2"  (1.575 m)  Weight: 275 lb (124.739 kg)  SpO2: 93%    Physical Exam  Constitutional: She is well-developed, well-nourished, and in no distress.  Musculoskeletal:       Right knee: She exhibits normal range of motion and no swelling. No tenderness found.  Left knee: She exhibits normal range of motion and no swelling. No tenderness found.  Nursing note and vitals reviewed.  Assessment & Plan  1. Need for immunization against influenza  - Flu Vaccine QUAD 36+ mos PF IM (Fluarix & Fluzone Quad PF)  2. Stiffness of both knees Suspect osteoarthritis. Will obtain x-rays and consider referring to orthopedics. - DG Knee Complete 4 Views Right; Future - DG Knee Complete 4 Views Left; Future   Cheyene Hamric Asad A. [facility] 06/22/2015 11:58 AM

## 2015-06-28 ENCOUNTER — Other Ambulatory Visit: Payer: Self-pay | Admitting: Family Medicine

## 2015-06-28 DIAGNOSIS — M170 Bilateral primary osteoarthritis of knee: Secondary | ICD-10-CM

## 2015-06-28 DIAGNOSIS — M179 Osteoarthritis of knee, unspecified: Secondary | ICD-10-CM | POA: Insufficient documentation

## 2015-06-28 DIAGNOSIS — M1710 Unilateral primary osteoarthritis, unspecified knee: Secondary | ICD-10-CM | POA: Insufficient documentation

## 2015-06-29 ENCOUNTER — Ambulatory Visit (INDEPENDENT_AMBULATORY_CARE_PROVIDER_SITE_OTHER): Payer: Medicare Other | Admitting: Family Medicine

## 2015-06-29 ENCOUNTER — Encounter: Payer: Self-pay | Admitting: Family Medicine

## 2015-06-29 VITALS — BP 134/78 | HR 78 | Temp 98.5°F | Resp 18 | Wt 268.5 lb

## 2015-06-29 DIAGNOSIS — F200 Paranoid schizophrenia: Secondary | ICD-10-CM | POA: Diagnosis not present

## 2015-06-29 DIAGNOSIS — Z021 Encounter for pre-employment examination: Secondary | ICD-10-CM

## 2015-06-29 DIAGNOSIS — K219 Gastro-esophageal reflux disease without esophagitis: Secondary | ICD-10-CM | POA: Diagnosis not present

## 2015-06-29 DIAGNOSIS — R011 Cardiac murmur, unspecified: Secondary | ICD-10-CM

## 2015-06-29 DIAGNOSIS — I10 Essential (primary) hypertension: Secondary | ICD-10-CM

## 2015-06-29 DIAGNOSIS — E785 Hyperlipidemia, unspecified: Secondary | ICD-10-CM | POA: Diagnosis not present

## 2015-06-29 DIAGNOSIS — E1169 Type 2 diabetes mellitus with other specified complication: Secondary | ICD-10-CM | POA: Insufficient documentation

## 2015-06-29 DIAGNOSIS — E119 Type 2 diabetes mellitus without complications: Secondary | ICD-10-CM | POA: Insufficient documentation

## 2015-06-29 DIAGNOSIS — R7303 Prediabetes: Secondary | ICD-10-CM | POA: Diagnosis not present

## 2015-06-29 HISTORY — DX: Encounter for pre-employment examination: Z02.1

## 2015-06-29 LAB — GLUCOSE, POCT (MANUAL RESULT ENTRY): POC Glucose: 120 mg/dl — AB (ref 70–99)

## 2015-06-29 LAB — POCT GLYCOSYLATED HEMOGLOBIN (HGB A1C): Hemoglobin A1C: 6.3

## 2015-06-29 MED ORDER — HALOPERIDOL 2 MG PO TABS: 4.0000 mg | ORAL_TABLET | Freq: Every day | ORAL | Status: DC

## 2015-06-29 MED ORDER — OMEPRAZOLE 20 MG PO CPDR: 20.0000 mg | DELAYED_RELEASE_CAPSULE | Freq: Every day | ORAL | Status: DC

## 2015-06-29 MED ORDER — ATORVASTATIN CALCIUM 20 MG PO TABS
20.0000 mg | ORAL_TABLET | Freq: Every day | ORAL | Status: DC
Start: 2015-06-29 — End: 2015-08-17

## 2015-06-29 MED ORDER — ATENOLOL 100 MG PO TABS: 100.0000 mg | ORAL_TABLET | Freq: Every day | ORAL | Status: DC

## 2015-06-29 MED ORDER — LISINOPRIL-HYDROCHLOROTHIAZIDE 20-25 MG PO TABS: 1.0000 | ORAL_TABLET | Freq: Every day | ORAL | Status: DC

## 2015-06-29 NOTE — Progress Notes (Signed)
Name: Lauren [name]   MRN: [id number]    DOB: [birth year]   Date:06/29/2015       Progress Note  Subjective  Chief Complaint  Chief Complaint  Patient presents with  . Knee Pain    patient is here for bilateral knee pain follow-up.  . Labs Only    patient has not eaten any breakfast but she has had a mint.  . Excessive Sweating    patient stated that she has been doing a lot of sweating recently.  . Medication Refill    patient is requesting a refill of her Haldol, 90 day refill since her psychiatrist is now out of network.    Hypertension This is a chronic problem. The problem is controlled. Associated symptoms include shortness of breath (with exertion). Pertinent negatives include no blurred vision, chest pain, headaches or palpitations. Past treatments include beta blockers, ACE inhibitors and diuretics. There is no history of kidney disease, CAD/MI or CVA. Identifiable causes of hypertension include sleep apnea (hx of sleep apnea, uses a CPAP).  Hyperlipidemia This is a chronic problem. The problem is controlled. Recent lipid tests were reviewed and are normal. Associated symptoms include shortness of breath (with exertion). Pertinent negatives include no chest pain, leg pain or myalgias (sometimes have a'twitch in the leg'). Current antihyperlipidemic treatment includes statins.  Heartburn She complains of heartburn. She reports no abdominal pain, no chest pain, no coughing, no dysphagia or no nausea. This is a chronic problem. The problem has been unchanged. The symptoms are aggravated by certain foods (mainly greasy foods.). Pertinent negatives include no weight loss. Risk factors include obesity. She has tried a PPI for the symptoms.   Pt. Is here for follow up and medication refill for Haldol. She has been diagnosed with Paranoid Schizophrenia and was under the care of Dr. Randel Books (Psychiatrist). She can no longer see him because he is apparently not on the [facility]  list of participating providers. She has been referred to [facility] but has not obtained an appointment yet. She is requesting refill for Haloperidol until she establishes with her Psychiatrist.  Pt. Is also requesting completion of paperwork related to her job. 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 The company apparently lost the contract with [employer] and she is now seeking employment with [employer] and needs her form completed. She works as a Quarry manager (responsibilities include assistance with ADLs including grooming, nail filing, bathing, clothing, dressing, laundry and cleaning services, and medication reminders). At her last appointment, she was asked to have her complete job description and employers' contact information provided to [place] by her employer but so far, we have not received anything. She is requesting the completion of form certifying her medical fitness to do her job.   Past Medical History  Diagnosis Date  . Hypertension   . Diabetes mellitus   . Herniated disc   . Anxiety     Past Surgical History  Procedure Laterality Date  . Brain tumor excision    . Lung lobectomy    . Cesarean section      History reviewed. No pertinent family history.  Social History   Social History  . Marital Status: Single    Spouse Name: N/A  . Number of Children: N/A  . Years of Education: N/A   Occupational History  . Not on file.   Social History Main Topics  . Smoking status: Never Smoker   .  Smokeless tobacco: Not on file  . Alcohol Use: No  . Drug Use: No  . Sexual Activity: Not on file   Other Topics Concern  . Not on file   Social History Narrative     Current outpatient prescriptions:  .  aspirin EC 81 MG tablet, Take by mouth., Disp: , Rfl:  .  atenolol (TENORMIN) 100 MG tablet, Take 1 tablet (100 mg total) by mouth daily., Disp: 90 tablet, Rfl: 0 .  atorvastatin (LIPITOR) 20 MG tablet, Take 1  tablet (20 mg total) by mouth at bedtime., Disp: 90 tablet, Rfl: 0 .  ferrous fumarate (HEMOCYTE - 106 MG FE) 325 (106 FE) MG TABS tablet, Take 1 tablet by mouth., Disp: , Rfl:  .  haloperidol (HALDOL) 2 MG tablet, Take 1 tablet by mouth at bedtime., Disp: , Rfl:  .  lisinopril-hydrochlorothiazide (PRINZIDE,ZESTORETIC) 20-25 MG per tablet, Take 1 tablet by mouth daily., Disp: 90 tablet, Rfl: 0 .  Melatonin 10 MG TABS, Take 1 tablet by mouth as needed., Disp: , Rfl:  .  Multiple Vitamins-Minerals (MULTIVITAMIN WOMEN 50+) TABS, Take by mouth., Disp: , Rfl:  .  omeprazole (PRILOSEC) 20 MG capsule, Take 1 capsule (20 mg total) by mouth daily., Disp: 90 capsule, Rfl: 0 .  Aspirin-Salicylamide-Caffeine (BC HEADACHE POWDER PO), Take 1 packet by mouth daily as needed. For headache and pain, Disp: , Rfl:   Allergies  Allergen Reactions  . Percocet [Oxycodone-Acetaminophen] Nausea And Vomiting, Diarrhea and Nausea Only  . Tramadol Hcl Diarrhea, Nausea Only and Nausea And Vomiting  . Vicodin [Hydrocodone-Acetaminophen] Nausea And Vomiting, Diarrhea and Nausea Only   Review of Systems  Constitutional: Negative for fever, chills and weight loss.  Eyes: Negative for blurred vision.  Respiratory: Positive for shortness of breath (with exertion). Negative for cough.   Cardiovascular: Negative for chest pain and palpitations.  Gastrointestinal: Positive for heartburn. Negative for dysphagia, nausea and abdominal pain.  Musculoskeletal: Positive for joint pain. Negative for myalgias (sometimes have a'twitch in the leg').  Neurological: Negative for headaches.  Psychiatric/Behavioral: Positive for depression. The patient is nervous/anxious and has insomnia.     Objective  Filed Vitals:   06/29/15 1050  BP: 134/78  Pulse: 78  Temp: 98.5 F (36.9 C)  TempSrc: Oral  Resp: 18  Weight: 268 lb 8 oz (121.791 kg)  SpO2: 95%    Physical Exam  Constitutional: She is well-developed, well-nourished, and in  no distress.  HENT:  Head: Normocephalic and atraumatic.  Cardiovascular: Normal rate and regular rhythm.   Murmur heard.  Systolic murmur is present with a grade of 2/6  Pulmonary/Chest: Effort normal and breath sounds normal. No respiratory distress. She has no rales.  Abdominal: Soft. Bowel sounds are normal. There is no tenderness.  Neurological: She is alert.  Psychiatric: Memory and affect normal. Her mood appears anxious.  Nursing note and vitals reviewed.   Assessment & Plan  1. Essential hypertension  - atenolol (TENORMIN) 100 MG tablet; Take 1 tablet (100 mg total) by mouth daily.  Dispense: 90 tablet; Refill: 0 - lisinopril-hydrochlorothiazide (PRINZIDE,ZESTORETIC) 20-25 MG tablet; Take 1 tablet by mouth daily.  Dispense: 90 tablet; Refill: 0  2. Paranoid schizophrenia ([place]) We will refill haloperidol and patient is going to establish with psychiatry - haloperidol (HALDOL) 2 MG tablet; Take 2 tablets (4 mg total) by mouth at bedtime.  Dispense: 180 tablet; Refill: 0  3. HLD (hyperlipidemia)  - atorvastatin (LIPITOR) 20 MG tablet; Take 1 tablet (20 mg total)  by mouth at bedtime.  Dispense: 90 tablet; Refill: 0 - Lipid Profile - Comprehensive Metabolic Panel (CMET)  4. Encounter for pre-employment examination Request paperwork regarding patient's job description from her employer and would also like to discuss her physical, mental, emotional health before completing the paperwork. Verbal consent obtained from patient to discuss her diagnoses with the employer.  5. Gastroesophageal reflux disease, esophagitis presence not specified  - omeprazole (PRILOSEC) 20 MG capsule; Take 1 capsule (20 mg total) by mouth daily.  Dispense: 90 capsule; Refill: 0  6. Borderline diabetes  - POCT HgB A1C - POCT Glucose (CBG)  7. Cardiac murmur  - Ambulatory referral to Cardiology   [facility] 06/29/2015 11:04 AM

## 2015-06-30 LAB — COMPREHENSIVE METABOLIC PANEL
ALT: 20 IU/L (ref 0–32)
AST: 23 IU/L (ref 0–40)
Albumin/Globulin Ratio: 1.6 (ref 1.1–2.5)
Albumin: 4.4 g/dL (ref 3.6–4.8)
Alkaline Phosphatase: 82 IU/L (ref 39–117)
BUN/Creatinine Ratio: 19 (ref 11–26)
BUN: 13 mg/dL (ref 8–27)
Bilirubin Total: 0.5 mg/dL (ref 0.0–1.2)
CO2: 21 mmol/L (ref 18–29)
Calcium: 9.9 mg/dL (ref 8.7–10.3)
Chloride: 100 mmol/L (ref 97–106)
Creatinine, Ser: 0.69 mg/dL (ref 0.57–1.00)
GFR calc Af Amer: 108 mL/min/{1.73_m2} (ref >59)
GFR calc non Af Amer: 94 mL/min/{1.73_m2} (ref >59)
Globulin, Total: 2.7 g/dL (ref 1.5–4.5)
Glucose: 113 mg/dL — ABNORMAL HIGH (ref 65–99)
Potassium: 4 mmol/L (ref 3.5–5.2)
Sodium: 139 mmol/L (ref 136–144)
Total Protein: 7.1 g/dL (ref 6.0–8.5)

## 2015-06-30 LAB — LIPID PANEL
Chol/HDL Ratio: 3.9 ratio units (ref 0.0–4.4)
Cholesterol, Total: 182 mg/dL (ref 100–199)
HDL: 47 mg/dL (ref >39)
LDL Calculated: 120 mg/dL — ABNORMAL HIGH (ref 0–99)
Triglycerides: 76 mg/dL (ref 0–149)
VLDL Cholesterol Cal: 15 mg/dL (ref 5–40)

## 2015-07-04 ENCOUNTER — Telehealth: Payer: Self-pay | Admitting: Family Medicine

## 2015-07-04 NOTE — Telephone Encounter (Signed)
Patient wants to see if you would prescribe anything for her excessive sweating, patient spoke to Dr. Manuella Ghazi about it at her last appointment. But patient states she was not prescribed anything for it and all her labs were normal except her LDL.

## 2015-07-04 NOTE — Telephone Encounter (Signed)
Patient will need evaluation for her symptoms of excessive sweating including lab work. Please schedule an appointment`

## 2015-07-04 NOTE — Telephone Encounter (Signed)
Pt would like a call back please. °

## 2015-07-04 NOTE — Telephone Encounter (Signed)
Tried calling pt to inform that she needs an appt and her VM is not setup yet.

## 2015-07-04 NOTE — Telephone Encounter (Signed)
Spoke with patient and appointment has been made for 07-05-15

## 2015-07-04 NOTE — Telephone Encounter (Signed)
Could you please schedule her appointment. Thanks

## 2015-07-05 ENCOUNTER — Ambulatory Visit
Admission: RE | Admit: 2015-07-05 | Discharge: 2015-07-05 | Disposition: A | Discharge status: Home / Self Care | Payer: Medicare Other | Source: Ambulatory Visit | Attending: Family Medicine | Admitting: Family Medicine

## 2015-07-05 ENCOUNTER — Ambulatory Visit (INDEPENDENT_AMBULATORY_CARE_PROVIDER_SITE_OTHER): Payer: Medicare Other | Admitting: Family Medicine

## 2015-07-05 ENCOUNTER — Encounter: Payer: Self-pay | Admitting: Family Medicine

## 2015-07-05 VITALS — BP 133/80 | HR 77 | Temp 97.5°F | Resp 19 | Ht 62.0 in | Wt 270.5 lb

## 2015-07-05 DIAGNOSIS — R61 Generalized hyperhidrosis: Secondary | ICD-10-CM

## 2015-07-05 DIAGNOSIS — R0602 Shortness of breath: Secondary | ICD-10-CM | POA: Insufficient documentation

## 2015-07-05 HISTORY — DX: Generalized hyperhidrosis: R61

## 2015-07-05 NOTE — Progress Notes (Signed)
Name: Lauren [name]   MRN: [id number]    DOB: 03-24-1951   Date:07/05/2015       Progress Note  Subjective  Chief Complaint  Chief Complaint  Patient presents with  . Excessive Sweating  . Labs Only    Discuss results    HPI  Excessive Sweating Pt. Complains of excessive sweating, first noticed 3 months ago. She notices that she often wakes up soaking wet, and thinks that her appetite has increased as a result. She notices more sweating on her shoulders, arms and head.this has gotten to the point where her employer has also asked her to 'have it checked out'. No history of thyroid disease.  Past Medical History  Diagnosis Date  . Hypertension   . Diabetes mellitus   . Herniated disc   . Anxiety     Past Surgical History  Procedure Laterality Date  . Brain tumor excision    . Lung lobectomy    . Cesarean section      History reviewed. No pertinent family history.  Social History   Social History  . Marital Status: Single    Spouse Name: N/A  . Number of Children: N/A  . Years of Education: N/A   Occupational History  . Not on file.   Social History Main Topics  . Smoking status: Never Smoker   . Smokeless tobacco: Not on file  . Alcohol Use: No  . Drug Use: No  . Sexual Activity: Not on file   Other Topics Concern  . Not on file   Social History Narrative    Current outpatient prescriptions:  .  aspirin EC 81 MG tablet, Take by mouth., Disp: , Rfl:  .  Aspirin-Salicylamide-Caffeine (BC HEADACHE POWDER PO), Take 1 packet by mouth daily as needed. For headache and pain, Disp: , Rfl:  .  atenolol (TENORMIN) 100 MG tablet, Take 1 tablet (100 mg total) by mouth daily., Disp: 90 tablet, Rfl: 0 .  atorvastatin (LIPITOR) 20 MG tablet, Take 1 tablet (20 mg total) by mouth at bedtime., Disp: 90 tablet, Rfl: 0 .  ferrous fumarate (HEMOCYTE - 106 MG FE) 325 (106 FE) MG TABS tablet, Take 1 tablet by mouth., Disp: , Rfl:  .  haloperidol (HALDOL) 2 MG tablet,  Take 2 tablets (4 mg total) by mouth at bedtime., Disp: 180 tablet, Rfl: 0 .  lisinopril-hydrochlorothiazide (PRINZIDE,ZESTORETIC) 20-25 MG tablet, Take 1 tablet by mouth daily., Disp: 90 tablet, Rfl: 0 .  Melatonin 10 MG TABS, Take 1 tablet by mouth as needed., Disp: , Rfl:  .  Multiple Vitamins-Minerals (MULTIVITAMIN WOMEN 50+) TABS, Take by mouth., Disp: , Rfl:  .  omeprazole (PRILOSEC) 20 MG capsule, Take 1 capsule (20 mg total) by mouth daily., Disp: 90 capsule, Rfl: 0 .  traZODone (DESYREL) 50 MG tablet, , Disp: , Rfl:   Allergies  Allergen Reactions  . Percocet [Oxycodone-Acetaminophen] Nausea And Vomiting, Diarrhea and Nausea Only  . Tramadol Hcl Diarrhea, Nausea Only and Nausea And Vomiting  . Vicodin [Hydrocodone-Acetaminophen] Nausea And Vomiting, Diarrhea and Nausea Only   Review of Systems  Constitutional: Positive for chills. Negative for fever and weight loss.  Eyes: Negative for blurred vision and double vision.  Respiratory: Positive for shortness of breath. Negative for cough, sputum production and wheezing.   Cardiovascular: Positive for chest pain and palpitations.  Gastrointestinal: Negative for abdominal pain and diarrhea.  Psychiatric/Behavioral: Positive for depression. The patient is nervous/anxious.      Objective  Filed Vitals:  07/05/15 1153  BP: 133/80  Pulse: 77  Temp: 97.5 F (36.4 C)  TempSrc: Oral  Resp: 19  Height: 5\' 2"  (1.575 m)  Weight: 270 lb 8 oz (122.698 kg)  SpO2: 94%    Physical Exam  Constitutional: She is well-developed, well-nourished, and in no distress.  HENT:  Head: Normocephalic and atraumatic.  Neck: Neck supple. No thyroid mass and no thyromegaly present.  Cardiovascular: Normal rate and regular rhythm.   Murmur heard. Pulmonary/Chest: Effort normal and breath sounds normal. She has no wheezes.  Abdominal: Soft. Bowel sounds are normal. There is no tenderness.  Neurological: She is alert.  Skin: Skin is warm and dry.  No rash noted. No erythema. No pallor.  Nursing note and vitals reviewed.   Assessment & Plan  1. Excessive sweating Obtain thyroid panel to rule out hyperthyroidism and a chest x-ray to rule out tuberculosis (unlikely). Follow-up after lab work. Patient may possibly benefit from a dermatology referral. - TSH - T4, free - DG Chest 2 View; Future   Dequavious Harshberger [facility] 07/05/2015 12:04 PM

## 2015-07-06 LAB — TSH: TSH: 0.674 u[IU]/mL (ref 0.450–4.500)

## 2015-07-06 LAB — T4, FREE: Free T4: 1.13 ng/dL (ref 0.82–1.77)

## 2015-07-12 NOTE — Addendum Note (Signed)
Addended byManuella Ghazi, Kattia Selley A A on: 07/12/2015 06:38 PM   Modules accepted: Orders

## 2015-07-19 ENCOUNTER — Ambulatory Visit: Payer: Medicare Other | Admitting: Family Medicine

## 2015-07-21 DIAGNOSIS — G4733 Obstructive sleep apnea (adult) (pediatric): Secondary | ICD-10-CM | POA: Diagnosis not present

## 2015-07-25 ENCOUNTER — Telehealth: Payer: Self-pay | Admitting: Family Medicine

## 2015-07-25 NOTE — Telephone Encounter (Signed)
Called patient and obtained the contact numbers for her employer. They are as follows [employer]  Phone # [phone] Fax # [phone] We will need to contact her employer to obtain a job description and discuss patient's paperwork

## 2015-07-25 NOTE — Telephone Encounter (Signed)
Patient sent in paperwork pertaining to her job for dr Manuella Ghazi to fill out and return about a month ago and she is checking status. Please return call [phone]

## 2015-07-25 NOTE — Telephone Encounter (Signed)
Routed to Dr. Manuella Ghazi, patient has been informed that her employer needs to fax paperwork

## 2015-08-01 DIAGNOSIS — G4733 Obstructive sleep apnea (adult) (pediatric): Secondary | ICD-10-CM | POA: Diagnosis not present

## 2015-08-17 ENCOUNTER — Other Ambulatory Visit: Payer: Self-pay | Admitting: Family Medicine

## 2015-08-20 DIAGNOSIS — G4733 Obstructive sleep apnea (adult) (pediatric): Secondary | ICD-10-CM | POA: Diagnosis not present

## 2015-08-30 ENCOUNTER — Ambulatory Visit (INDEPENDENT_AMBULATORY_CARE_PROVIDER_SITE_OTHER): Payer: Medicare Other | Admitting: Cardiovascular Disease

## 2015-08-30 ENCOUNTER — Encounter: Payer: Self-pay | Admitting: Cardiovascular Disease

## 2015-08-30 DIAGNOSIS — R002 Palpitations: Secondary | ICD-10-CM

## 2015-08-30 DIAGNOSIS — R079 Chest pain, unspecified: Secondary | ICD-10-CM

## 2015-08-31 NOTE — Progress Notes (Signed)
Had to reschedule due to an emergency.

## 2015-09-07 ENCOUNTER — Ambulatory Visit (INDEPENDENT_AMBULATORY_CARE_PROVIDER_SITE_OTHER): Payer: Medicare Other | Admitting: Licensed Clinical Social Worker

## 2015-09-07 DIAGNOSIS — F200 Paranoid schizophrenia: Secondary | ICD-10-CM | POA: Diagnosis not present
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
## 2015-09-20 DIAGNOSIS — G4733 Obstructive sleep apnea (adult) (pediatric): Secondary | ICD-10-CM | POA: Diagnosis not present

## 2015-09-21 ENCOUNTER — Telehealth: Payer: Self-pay | Admitting: Family Medicine

## 2015-09-21 NOTE — Telephone Encounter (Signed)
I need to know her exact job description and what is her expected role in this position before I can decide if she is able to do the task. Thanks.

## 2015-09-21 NOTE — Telephone Encounter (Signed)
Routed to Dr. Shah for advice  

## 2015-09-21 NOTE — Telephone Encounter (Signed)
[employer] NEEDING A NOTE SAYING THAT SHE IS ABLE TO DO THE TASK FOR HOME CARE. THIS IS FOR HER EMPLOYER. FAX # IS [phone]. SHE HAS ASKED FOR THIS SEVERAL TIMES AND NOW THE EMPLOYER IS CALLING NEEDING THIS.

## 2015-09-28 ENCOUNTER — Other Ambulatory Visit: Payer: Self-pay | Admitting: Family Medicine

## 2015-09-28 ENCOUNTER — Other Ambulatory Visit: Payer: Self-pay

## 2015-09-28 DIAGNOSIS — F200 Paranoid schizophrenia: Secondary | ICD-10-CM

## 2015-09-28 NOTE — Telephone Encounter (Signed)
Requesting a refill on haloperidol, lisinopril and atenolol. [facility]. States that she have less than 2 weeks worth left.

## 2015-10-02 ENCOUNTER — Ambulatory Visit: Payer: Medicare Other | Admitting: Family Medicine

## 2015-10-02 ENCOUNTER — Ambulatory Visit (INDEPENDENT_AMBULATORY_CARE_PROVIDER_SITE_OTHER): Payer: 59 | Admitting: Licensed Clinical Social Worker

## 2015-10-02 DIAGNOSIS — F200 Paranoid schizophrenia: Secondary | ICD-10-CM | POA: Diagnosis not present

## 2015-10-02 NOTE — Progress Notes (Signed)
   THERAPIST PROGRESS NOTE  Session Time: 40min  Participation Level: Active  Behavioral Response: DisheveledAlertHopeless  Type of Therapy: Individual Therapy  Treatment Goals addressed: Diagnosis: Schizophrenia  Interventions: CBT, Motivational Interviewing, Supportive, Family Systems and Reframing  Summary: Lauren [name] is a 65 y.o. female who presents with continued symptoms of her diagnosis.  She reports that she has had a few crying spells the past few days.  She reports reading "Chicken [employer] for the Soul" and feels bad for not being a good parent to her son.  She reports isolation from friends and family.  She reports that she is able to work 3 hours per day at [employer]  Reports that "the people" do not want her working anymore. She reports that religion helps her maintain symptoms.  She reports watching [employer], Etheleen Nicks and other Psychiatric nurse.  Reports that she takes her Haldol medication as directed.  She has an Psychiatrist appointment October 26 2015.  Encouraged the importance of taking medication daily and attending therapy regularly.  Discussion of history of medication and therapy.    Suicidal/Homicidal: Nowithout intent/plan  Therapist Response: LCSW provided Patient with ongoing emotional support and encouragement.  Normalized her feelings.  Commended Patient on her progress and reinforced the importance of client staying focused on her own strengths and resources and resiliency. Processed various strategies for dealing with stressors.    Plan: Return again in 2 weeks.  Diagnosis: Axis I: Paranoid Schizophrenia    Axis II: No diagnosis    Lubertha South 10/02/2015

## 2015-10-04 MED ORDER — LISINOPRIL-HYDROCHLOROTHIAZIDE 20-25 MG PO TABS: ORAL_TABLET | ORAL | Status: DC

## 2015-10-04 MED ORDER — ATENOLOL 100 MG PO TABS: ORAL_TABLET | ORAL | Status: DC

## 2015-10-04 MED ORDER — HALOPERIDOL 2 MG PO TABS: 4.0000 mg | ORAL_TABLET | Freq: Every day | ORAL | Status: DC

## 2015-10-04 NOTE — Telephone Encounter (Signed)
Medication has been refilled and [facility] to [facility] 

## 2015-10-10 ENCOUNTER — Ambulatory Visit: Payer: Medicare Other | Admitting: Psychiatry

## 2015-10-16 ENCOUNTER — Telehealth: Payer: Self-pay

## 2015-10-16 NOTE — Telephone Encounter (Signed)
[facility] still waiting on letter of what patient can do?  They will send over a form to fill out , if you dont want to write letter.  They are faxing

## 2015-10-17 ENCOUNTER — Ambulatory Visit: Payer: Medicare Other | Admitting: Licensed Clinical Social Worker

## 2015-10-20 ENCOUNTER — Telehealth: Payer: Self-pay | Admitting: Family Medicine

## 2015-10-20 NOTE — Telephone Encounter (Signed)
Hassan Rowan Apple from [employer] has been trying to get a letter stating [place] restrictions. sHe has faxed over the form on 10-16-15 for you to fill out if you did not wish to write the letter. Please return her call. States that she has been trying to get this resolved for the past month. She is willing to come and sit while you fill out the form and she is willing to come by to pick it up.  (F) [phone] (P) [phone]

## 2015-10-21 DIAGNOSIS — G4733 Obstructive sleep apnea (adult) (pediatric): Secondary | ICD-10-CM | POA: Diagnosis not present

## 2015-10-23 ENCOUNTER — Encounter: Payer: Self-pay | Admitting: Cardiovascular Disease

## 2015-10-23 ENCOUNTER — Ambulatory Visit (INDEPENDENT_AMBULATORY_CARE_PROVIDER_SITE_OTHER): Payer: Medicare Other | Admitting: Cardiovascular Disease

## 2015-10-23 VITALS — BP 110/72 | HR 72 | Ht 64.0 in | Wt 278.0 lb

## 2015-10-23 DIAGNOSIS — R079 Chest pain, unspecified: Secondary | ICD-10-CM | POA: Diagnosis not present

## 2015-10-23 DIAGNOSIS — R011 Cardiac murmur, unspecified: Secondary | ICD-10-CM

## 2015-10-23 DIAGNOSIS — R0602 Shortness of breath: Secondary | ICD-10-CM

## 2015-10-23 DIAGNOSIS — I10 Essential (primary) hypertension: Secondary | ICD-10-CM

## 2015-10-23 DIAGNOSIS — E785 Hyperlipidemia, unspecified: Secondary | ICD-10-CM

## 2015-10-23 DIAGNOSIS — R002 Palpitations: Secondary | ICD-10-CM | POA: Diagnosis not present

## 2015-10-23 NOTE — Assessment & Plan Note (Addendum)
The murmur seems to be functional. Nonetheless, she describes dyspnea with minimal activities and thus I requested an echocardiogram. I do suspect that her dyspnea is mostly related to physical deconditioning and obesity. I advised her to consider starting an exercise program. I do not think she requires further ischemic cardiac evaluation. Continue aggressive treatment of risk factors. She can follow-up with [place] as needed.

## 2015-10-23 NOTE — Patient Instructions (Signed)
Medication Instructions:  Your physician recommends that you continue on your current medications as directed. Please refer to the Current Medication list given to you today.   Labwork: none  Testing/Procedures: Your physician has requested that you have an echocardiogram. Echocardiography is a painless test that uses sound waves to create images of your heart. It provides your doctor with information about the size and shape of your heart and how well your heart's chambers and valves are working. This procedure takes approximately one hour. There are no restrictions for this procedure.    Follow-Up: Your physician recommends that you schedule a follow-up appointment as needed.    Any Other Special Instructions Will Be Listed Below (If Applicable).     If you need a refill on your cardiac medications before your next appointment, please call your pharmacy.  Echocardiogram An echocardiogram, or echocardiography, uses sound waves (ultrasound) to produce an image of your heart. The echocardiogram is simple, painless, obtained within a short period of time, and offers valuable information to your health care provider. The images from an echocardiogram can provide information such as:  Evidence of coronary artery disease (CAD).  Heart size.  Heart muscle function.  Heart valve function.  Aneurysm detection.  Evidence of a past heart attack.  Fluid buildup around the heart.  Heart muscle thickening.  Assess heart valve function. LET YOUR HEALTH CARE PROVIDER KNOW ABOUT:  Any allergies you have.  All medicines you are taking, including vitamins, herbs, eye drops, creams, and over-the-counter medicines.  Previous problems you or members of your family have had with the use of anesthetics.  Any blood disorders you have.  Previous surgeries you have had.  Medical conditions you have.  Possibility of pregnancy, if this applies. BEFORE THE PROCEDURE  No special  preparation is needed. Eat and drink normally.  PROCEDURE   In order to produce an image of your heart, gel will be applied to your chest and a wand-like tool (transducer) will be moved over your chest. The gel will help transmit the sound waves from the transducer. The sound waves will harmlessly bounce off your heart to allow the heart images to be captured in real-time motion. These images will then be recorded.  You may need an IV to receive a medicine that improves the quality of the pictures. AFTER THE PROCEDURE You may return to your normal schedule including diet, activities, and medicines, unless your health care provider tells you otherwise.   This information is not intended to replace advice given to you by your health care provider. Make sure you discuss any questions you have with your health care provider.   Document Released: 08/23/2000 Document Revised: 09/16/2014 Document Reviewed: 05/03/2013 Elsevier Interactive Patient Education 2016 Elsevier Inc.  

## 2015-10-23 NOTE — Progress Notes (Signed)
Primary care physician: Dr. Keith Rake  HPI  This is a 65 year old female who was referred by Dr. Manuella Ghazi for evaluation of a cardiac murmur. She has no previous cardiac history. She reports having cardiac catheterization in 2008 by [facility] and was told that everything was fine. She has known history of borderline diabetes, hypertension, hyperlipidemia and morbid obesity. She is a lifelong nonsmoker. She has no family history of premature coronary artery disease. Her brother did have CABG but he was in his 81s. She was told about a heart murmur recently. She denies any chest pain. She does complain of dyspnea with minimal activities. Her lifestyle is very sedentary. No orthopnea, PND or significant leg edema.   Allergies  Allergen Reactions  . Percocet [Oxycodone-Acetaminophen] Nausea And Vomiting, Diarrhea and Nausea Only  . Tramadol Hcl Diarrhea, Nausea Only and Nausea And Vomiting  . Vicodin [Hydrocodone-Acetaminophen] Nausea And Vomiting, Diarrhea and Nausea Only     Current Outpatient Prescriptions on File Prior to Visit  Medication Sig Dispense Refill  . aspirin EC 81 MG tablet Take by mouth.    [name] atenolol (TENORMIN) 100 MG tablet Take 1 tablet by mouth  daily 90 tablet 0  . atorvastatin (LIPITOR) 20 MG tablet Take 1 tablet by mouth at  bedtime 90 tablet 0  . ferrous fumarate (HEMOCYTE - 106 MG FE) 325 (106 FE) MG TABS tablet Take 1 tablet by mouth.    . haloperidol (HALDOL) 2 MG tablet Take 2 tablets (4 mg total) by mouth at bedtime. 180 tablet 0  . lisinopril-hydrochlorothiazide (PRINZIDE,ZESTORETIC) 20-25 MG tablet Take 1 tablet by mouth  daily 90 tablet 0  . Multiple Vitamins-Minerals (MULTIVITAMIN WOMEN 50+) TABS Take by mouth.    [name] omeprazole (PRILOSEC) 20 MG capsule Take 1 capsule by mouth  daily 90 capsule 0   No current facility-administered medications on file prior to visit.     Past Medical History  Diagnosis Date  . Hypertension   . Diabetes mellitus   .  Herniated disc   . Anxiety   . Heart murmur   . Hyperlipidemia   . Lung tumor   . Rheumatoid arthritis ([place])   . Fibromyalgia      Past Surgical History  Procedure Laterality Date  . Brain tumor excision    . Lung lobectomy    . Cesarean section    . Cardiac catheterization       Family History  Problem Relation Age of Onset  . Heart disease Brother      Social History   Social History  . Marital Status: Single    Spouse Name: N/A  . Number of Children: N/A  . Years of Education: N/A   Occupational History  . Not on file.   Social History Main Topics  . Smoking status: Never Smoker   . Smokeless tobacco: Not on file  . Alcohol Use: No  . Drug Use: No  . Sexual Activity: Not on file   Other Topics Concern  . Not on file   Social History Narrative     ROS A 10 point review of system was performed. It is negative other than that mentioned in the history of present illness.   PHYSICAL EXAM   BP 110/72 mmHg  Pulse 72  Ht 5\' 4"  (1.626 m)  Wt 278 lb (126.1 kg)  BMI 47.70 kg/m2 Constitutional: She is oriented to person, place, and time. She appears well-developed and well-nourished. No distress.  HENT: No nasal discharge.  Head:  Normocephalic and atraumatic.  Eyes: Pupils are equal and round. No discharge.  Neck: Normal range of motion. Neck supple. No JVD present. No thyromegaly present.  Cardiovascular: Normal rate, regular rhythm, normal heart sounds. Exam reveals no gallop and no friction rub.  There is one out of 6 systolic ejection murmur in the aortic area.  Pulmonary/Chest: Effort normal and breath sounds normal. No stridor. No respiratory distress. She has no wheezes. She has no rales. She exhibits no tenderness.  Abdominal: Soft. Bowel sounds are normal. She exhibits no distension. There is no tenderness. There is no rebound and no guarding.  Musculoskeletal: Normal range of motion. She exhibits no edema and no tenderness.  Neurological: She is  alert and oriented to person, place, and time. Coordination normal.  Skin: Skin is warm and dry. No rash noted. She is not diaphoretic. No erythema. No pallor.  Psychiatric: She has a normal mood and affect. Her behavior is normal. Judgment and thought content normal.     EKG: Normal sinus rhythm with borderline LVH.   ASSESSMENT AND PLAN

## 2015-10-23 NOTE — Assessment & Plan Note (Signed)
Blood pressure is well controlled on current medications. 

## 2015-10-23 NOTE — Assessment & Plan Note (Signed)
Lab Results  Component Value Date   CHOL 182 06/29/2015   HDL 47 06/29/2015   LDLCALC 120* 06/29/2015   TRIG 76 06/29/2015   CHOLHDL 3.9 06/29/2015   Continue treatment with atorvastatin with a target LDL of less than 100.

## 2015-10-25 ENCOUNTER — Encounter: Payer: Self-pay | Admitting: Family Medicine

## 2015-10-25 ENCOUNTER — Ambulatory Visit (INDEPENDENT_AMBULATORY_CARE_PROVIDER_SITE_OTHER): Payer: Medicare Other | Admitting: Family Medicine

## 2015-10-25 VITALS — BP 135/76 | HR 80 | Temp 98.5°F | Resp 18 | Ht 62.0 in | Wt 273.0 lb

## 2015-10-25 DIAGNOSIS — Z021 Encounter for pre-employment examination: Secondary | ICD-10-CM | POA: Diagnosis not present

## 2015-10-25 NOTE — Progress Notes (Signed)
Name: Lauren [name]   MRN: [id number]    DOB: 1951-05-09   Date:10/25/2015       Progress Note  Subjective  Chief Complaint  Chief Complaint  Patient presents with  . 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 I have spoken with patient's supervisor and have reviewed the job description sent from her supervisor's office. Patient returns to discuss if I am willing to sign her paperwork for her to be able to continue her job.  Past Medical History  Diagnosis Date  . Hypertension   . Diabetes mellitus   . Herniated disc   . Anxiety   . Heart murmur   . Hyperlipidemia   . Lung tumor   . Rheumatoid arthritis ([place])   . Fibromyalgia     Past Surgical History  Procedure Laterality Date  . Brain tumor excision    . Lung lobectomy    . Cesarean section    . Cardiac catheterization      Family History  Problem Relation Age of Onset  . Heart disease Brother     Social History   Social History  . Marital Status: Single    Spouse Name: N/A  . Number of Children: N/A  . Years of Education: N/A   Occupational History  . Not on file.   Social History Main Topics  . Smoking status: Never Smoker   . Smokeless tobacco: Not on file  . Alcohol Use: No  . Drug Use: No  . Sexual Activity: Not on file   Other Topics Concern  . Not on file   Social History Narrative     Current outpatient prescriptions:  .  aspirin EC 81 MG tablet, Take by mouth., Disp: , Rfl:  .  atenolol (TENORMIN) 100 MG tablet, Take 1 tablet by mouth  daily, Disp: 90 tablet, Rfl: 0 .  atorvastatin (LIPITOR) 20 MG tablet, Take 1 tablet by mouth at  bedtime, Disp: 90 tablet, Rfl: 0 .  ferrous fumarate (HEMOCYTE - 106 MG FE) 325 (106 FE) MG TABS tablet, Take 1 tablet by mouth., Disp: , Rfl:  .  haloperidol (HALDOL) 2 MG tablet, Take 2 tablets (4 mg total) by mouth at bedtime., Disp: 180 tablet, Rfl: 0 .  ibuprofen  (ADVIL,MOTRIN) 600 MG tablet, Take 600-1,200 mg by mouth every 6 (six) hours as needed., Disp: , Rfl:  .  lisinopril-hydrochlorothiazide (PRINZIDE,ZESTORETIC) 20-25 MG tablet, Take 1 tablet by mouth  daily, Disp: 90 tablet, Rfl: 0 .  Multiple Vitamins-Minerals (MULTIVITAMIN WOMEN 50+) TABS, Take by mouth., Disp: , Rfl:  .  omeprazole (PRILOSEC) 20 MG capsule, Take 1 capsule by mouth  daily, Disp: 90 capsule, Rfl: 0  Allergies  Allergen Reactions  . Percocet [Oxycodone-Acetaminophen] Nausea And Vomiting, Diarrhea and Nausea Only  . Tramadol Hcl Diarrhea, Nausea Only and Nausea And Vomiting  . Vicodin [Hydrocodone-Acetaminophen] Nausea And Vomiting, Diarrhea and Nausea Only     ROS    Objective  Filed Vitals:   10/25/15 1150  BP: 135/76  Pulse: 80  Temp: 98.5 F (36.9 C)  TempSrc: Oral  Resp: 18  Height: 5\' 2"  (1.575 m)  Weight: 273 lb (123.832 kg)  SpO2: 96%    Physical Exam  Constitutional: She is oriented to person, place, and time and well-developed, well-nourished, and in no distress.  Neurological: She is alert and oriented to person, place, and time.  Nursing note and vitals reviewed.    Assessment & Plan  1. Encounter for pre-employment health screening examination I have reiterated to patient that after reviewing her job description, will not be able to clear her to continue her employment. Patient is upset and wants to have the forms reviewed by her psychiatrist to see if they are willing to sign off on it. Copies of job description and employment form are kept for our record, original is provided to the patient.  Lauren [name] [facility] 10/25/2015 9:18 PM

## 2015-10-26 ENCOUNTER — Encounter: Payer: Self-pay | Admitting: Psychiatry

## 2015-10-26 ENCOUNTER — Ambulatory Visit: Payer: Medicare Other | Admitting: Psychiatry

## 2015-10-26 ENCOUNTER — Ambulatory Visit (INDEPENDENT_AMBULATORY_CARE_PROVIDER_SITE_OTHER): Payer: 59 | Admitting: Psychiatry

## 2015-10-26 DIAGNOSIS — Z709 Sex counseling, unspecified: Secondary | ICD-10-CM

## 2015-10-26 DIAGNOSIS — K0889 Other specified disorders of teeth and supporting structures: Secondary | ICD-10-CM | POA: Insufficient documentation

## 2015-10-26 DIAGNOSIS — F200 Paranoid schizophrenia: Secondary | ICD-10-CM

## 2015-10-26 DIAGNOSIS — L299 Pruritus, unspecified: Secondary | ICD-10-CM

## 2015-10-26 DIAGNOSIS — IMO0002 Reserved for concepts with insufficient information to code with codable children: Secondary | ICD-10-CM

## 2015-10-26 HISTORY — DX: Reserved for concepts with insufficient information to code with codable children: IMO0002

## 2015-10-26 HISTORY — DX: Pruritus, unspecified: L29.9

## 2015-10-26 HISTORY — DX: Sex counseling, unspecified: Z70.9

## 2015-10-26 MED ORDER — SERTRALINE HCL 25 MG PO TABS: 25.0000 mg | ORAL_TABLET | Freq: Every day | ORAL | Status: DC
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
## 2015-10-30 DIAGNOSIS — G4733 Obstructive sleep apnea (adult) (pediatric): Secondary | ICD-10-CM | POA: Diagnosis not present

## 2015-11-03 ENCOUNTER — Ambulatory Visit (INDEPENDENT_AMBULATORY_CARE_PROVIDER_SITE_OTHER): Payer: Medicare Other

## 2015-11-03 ENCOUNTER — Other Ambulatory Visit: Payer: Self-pay

## 2015-11-03 DIAGNOSIS — R0602 Shortness of breath: Secondary | ICD-10-CM

## 2015-11-18 DIAGNOSIS — G4733 Obstructive sleep apnea (adult) (pediatric): Secondary | ICD-10-CM | POA: Diagnosis not present

## 2015-11-22 ENCOUNTER — Other Ambulatory Visit: Payer: Self-pay | Admitting: Family Medicine

## 2015-11-23 ENCOUNTER — Other Ambulatory Visit: Payer: Self-pay | Admitting: Family Medicine

## 2015-11-23 ENCOUNTER — Telehealth: Payer: Self-pay

## 2015-11-23 ENCOUNTER — Ambulatory Visit (INDEPENDENT_AMBULATORY_CARE_PROVIDER_SITE_OTHER): Payer: 59 | Admitting: Psychiatry

## 2015-11-23 ENCOUNTER — Encounter: Payer: Self-pay | Admitting: Psychiatry

## 2015-11-23 VITALS — BP 148/82 | HR 89 | Temp 97.4°F | Ht 62.0 in | Wt 272.0 lb

## 2015-11-23 DIAGNOSIS — F200 Paranoid schizophrenia: Secondary | ICD-10-CM | POA: Diagnosis not present

## 2015-11-23 MED ORDER — HALOPERIDOL 2 MG PO TABS: 6.0000 mg | ORAL_TABLET | Freq: Every day | ORAL | Status: DC

## 2015-11-23 NOTE — Progress Notes (Signed)
Patient ID: Lauren [name], female   DOB: June 28, 1951, 65 y.o.   MRN: [id number]  Psychiatric Progress note  Patient Identification: Lauren [name] MRN:  [id number] Date of Evaluation:  11/23/2015 Chief Complaint:   Chief Complaint    Follow-up; Medication Refill; Stress     Visit Diagnosis: Anxiety Disorder, MDD, Schizophrenia Diagnosis:   Patient Active Problem List   Diagnosis Date Noted  . Gravida 1 [Z34.00] 10/26/2015  . Dentagra [K08.89] 10/26/2015  . Itch of skin [L29.9] 10/26/2015  . Sex counseling [Z70.9] 10/26/2015  . Excessive sweating [R61] 07/05/2015  . Acid reflux [K21.9] 06/29/2015  . Encounter for pre-employment examination [Z02.1] 06/29/2015  . Borderline diabetes [R73.03] 06/29/2015  . Cardiac murmur [R01.1] 06/29/2015  . 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 Stiffness of both knees [M25.661, M25.662] 06/22/2015  . Insomnia w/ sleep apnea [G47.00, G47.30] 03/21/2015  . Anxiety disorder [F41.9] 03/21/2015  . BP (high blood pressure) [I10] 03/21/2015  . HLD (hyperlipidemia) [E78.5] 03/21/2015  . Paranoid schizophrenia ([place]) [F20.0] 03/21/2015  . Urge incontinence [N39.41] 03/21/2015  . Breathlessness on exertion [R06.09] 02/02/2014  . Awareness of heartbeats [R00.2] 02/02/2014  . Apnea, sleep [G47.30] 02/02/2014   History of Present Illness:  Patient is a 65 yo AAF with Paranoid Schizophrenia here for follow up. She reports doing well on Zoloft. States she is not as anxious or depressed. She does not worry as much about her bills. She was having trouble sleeping and increased Haldol to 6mg , states it has been helpful. She is sleeping better and her mind is not racing as much. Fair sleep and appetite. Denies any psychotic symptoms.   Past Medical History:  Past Medical History  Diagnosis Date  . Hypertension   . Diabetes mellitus   . Herniated disc   . Anxiety   . Heart murmur   . Hyperlipidemia   . Lung tumor   . Rheumatoid  arthritis ([place])   . Fibromyalgia   . Schizoaffective disorder [facility])     Past Surgical History  Procedure Laterality Date  . Brain tumor excision    . Lung lobectomy    . Cesarean section    . Cardiac catheterization    . Cyst removal hand     Family History:  Family History  Problem Relation Age of Onset  . Heart disease Brother   . Depression Mother   . Heart attack Mother   . Stroke Mother   . Alcohol abuse Father   . Stroke Father   . Diabetes Sister   . Diabetes Sister   . Stomach cancer Sister   . Kidney disease Sister   . COPD Brother   . Lung cancer Brother   . Diabetes Brother    Social History:   Social History   Social History  . Marital Status: Single    Spouse Name: N/A  . Number of Children: N/A  . Years of Education: N/A   Social History Main Topics  . Smoking status: Never Smoker   . Smokeless tobacco: Never Used  . Alcohol Use: No  . Drug Use: No  . Sexual Activity: No   Other Topics Concern  . None   Social History Narrative   Additional Social History:   Musculoskeletal: Strength & Muscle Tone: within normal limits Gait & Station: broad based Patient leans: N/A  Psychiatric Specialty Exam: HPI  ROS  Blood pressure 148/82, pulse 89, temperature 97.4 F (36.3 C), temperature source Tympanic, height 5\' 2"  (1.575 m),  weight 272 lb (123.378 kg), SpO2 90 %.Body mass index is 49.74 kg/(m^2).  General Appearance: Casual  Eye Contact:  Fair  Speech:  Stilted but articulate  Volume:  Normal  Mood:  Euthymic  Affect:  Constricted  Thought Process:  Circumstantial  Orientation:  Full (Time, Place, and Person)  Thought Content:  Paranoid Ideation and Rumination  Suicidal Thoughts:  No  Homicidal Thoughts:  No  Memory:  Immediate;   Fair Recent;   Fair Remote;   Fair  Judgement:  Fair  Insight:  Fair  Psychomotor Activity:  NA  Concentration:  Fair  Recall:  [employer] of Knowledge:Fair  Language: Fair  Akathisia:  No  Handed:   Right  AIMS (if indicated):  Some speech impediment, no cog wheeling  Assets:  Communication Skills Desire for Improvement Financial Resources/Insurance Housing  ADL's:  Intact  Cognition: WNL  Sleep:  okay   Is the patient at risk to self?  No. Has the patient been a risk to self in the past 6 months?  No. Has the patient been a risk to self within the distant past?  Yes.   Is the patient a risk to others?  No. Has the patient been a risk to others in the past 6 months?  No. Has the patient been a risk to others within the distant past?  No.  Allergies:   Allergies  Allergen Reactions  . Percocet [Oxycodone-Acetaminophen] Nausea And Vomiting, Diarrhea and Nausea Only  . Tramadol Hcl Diarrhea, Nausea Only and Nausea And Vomiting  . Vicodin [Hydrocodone-Acetaminophen] Nausea And Vomiting, Diarrhea and Nausea Only   Current Medications: Current Outpatient Prescriptions  Medication Sig Dispense Refill  . aspirin EC 81 MG tablet Take by mouth.    [name] atenolol (TENORMIN) 100 MG tablet Take 1 tablet by mouth  daily 90 tablet 0  . atorvastatin (LIPITOR) 20 MG tablet Take 1 tablet by mouth at  bedtime 90 tablet 0  . ferrous fumarate (HEMOCYTE - 106 MG FE) 325 (106 FE) MG TABS tablet Take 1 tablet by mouth.    . haloperidol (HALDOL) 2 MG tablet Take 2 tablets (4 mg total) by mouth at bedtime. 180 tablet 0  . ibuprofen (ADVIL,MOTRIN) 600 MG tablet Take 600-1,200 mg by mouth every 6 (six) hours as needed.    [name] lisinopril-hydrochlorothiazide (PRINZIDE,ZESTORETIC) 20-25 MG tablet Take 1 tablet by mouth  daily 90 tablet 0  . Multiple Vitamins-Minerals (MULTIVITAMIN WOMEN 50+) TABS Take by mouth.    [name] omeprazole (PRILOSEC) 20 MG capsule Take 1 capsule by mouth  daily 90 capsule 0  . sertraline (ZOLOFT) 25 MG tablet Take 1 tablet (25 mg total) by mouth daily. 30 tablet 2   No current facility-administered medications for this visit.    Previous Psychotropic Medications: Yes   Substance Abuse  History in the last 12 months:  No.  Consequences of Substance Abuse: Negative  Medical Decision Making:  Established Problem, Stable/Improving (1), Review of Psycho-Social Stressors (1), Review or order clinical lab tests (1), Review and summation of old records (2) and Review of Medication Regimen & Side Effects (2)  Treatment Plan Summary: Medication management  Paranoid schizophrenia Increase Haldol to 6 mg at bedtime. Patient denies any problems with side effects, aims was negative.  Anxiety Continue Zoloft at 25 mg by mouth daily Patient aware of benefits and side effects  Return to clinic in 2 month's time or call before if necessary.  Duglas Heier 3/16/201711:00 AM

## 2015-11-23 NOTE — Telephone Encounter (Signed)
rx for haldol 2mg  was faxed and confirmed by pharmacy. id # [id number] order # [id number]

## 2015-12-19 DIAGNOSIS — G4733 Obstructive sleep apnea (adult) (pediatric): Secondary | ICD-10-CM | POA: Diagnosis not present

## 2016-01-18 DIAGNOSIS — G4733 Obstructive sleep apnea (adult) (pediatric): Secondary | ICD-10-CM | POA: Diagnosis not present

## 2016-01-24 ENCOUNTER — Ambulatory Visit: Payer: 59 | Admitting: Psychiatry

## 2016-01-25 ENCOUNTER — Encounter: Payer: Self-pay | Admitting: Psychiatry

## 2016-01-25 ENCOUNTER — Ambulatory Visit (INDEPENDENT_AMBULATORY_CARE_PROVIDER_SITE_OTHER): Payer: 59 | Admitting: Psychiatry

## 2016-01-25 VITALS — BP 128/68 | HR 68 | Temp 97.4°F

## 2016-01-25 DIAGNOSIS — F200 Paranoid schizophrenia: Secondary | ICD-10-CM | POA: Diagnosis not present

## 2016-01-25 MED ORDER — SERTRALINE HCL 50 MG PO TABS: 50.0000 mg | ORAL_TABLET | Freq: Every day | ORAL | Status: DC

## 2016-01-25 MED ORDER — HALOPERIDOL 2 MG PO TABS
6.0000 mg | ORAL_TABLET | Freq: Every day | ORAL | Status: DC
Start: 2016-01-25 — End: 2016-06-20

## 2016-01-25 NOTE — Progress Notes (Signed)
Patient ID: KRYSTALYN SWOR, female   DOB: 11/26/1950, 65 y.o.   MRN: [id number]   Psychiatric Progress note  Patient Identification: Lauren [name] MRN:  [id number] Date of Evaluation:  01/25/2016 Chief Complaint:    Visit Diagnosis: Anxiety Disorder, MDD, Schizophrenia Diagnosis:   Patient Active Problem List   Diagnosis Date Noted  . Gravida 1 [Z34.00] 10/26/2015  . Dentagra [K08.89] 10/26/2015  . Itch of skin [L29.9] 10/26/2015  . Sex counseling [Z70.9] 10/26/2015  . Excessive sweating [R61] 07/05/2015  . Acid reflux [K21.9] 06/29/2015  . Encounter for pre-employment examination [Z02.1] 06/29/2015  . Borderline diabetes [R73.03] 06/29/2015  . Cardiac murmur [R01.1] 06/29/2015  . 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 Stiffness of both knees [M25.661, M25.662] 06/22/2015  . Insomnia w/ sleep apnea [G47.00, G47.30] 03/21/2015  . Anxiety disorder [F41.9] 03/21/2015  . BP (high blood pressure) [I10] 03/21/2015  . HLD (hyperlipidemia) [E78.5] 03/21/2015  . Paranoid schizophrenia ([place]) [F20.0] 03/21/2015  . Urge incontinence [N39.41] 03/21/2015  . Breathlessness on exertion [R06.09] 02/02/2014  . Awareness of heartbeats [R00.2] 02/02/2014  . Apnea, sleep [G47.30] 02/02/2014   History of Present Illness:  Patient is a 65 yo AAF with Paranoid Schizophrenia here for follow up. She reports feeling more anxious. States she has thoughts that she is inferior to others. Denies hearing any voices or seeing any visions. Denies any suicidal thoughts. Fair sleep and appetite.   Past Medical History:  Past Medical History  Diagnosis Date  . Hypertension   . Diabetes mellitus   . Herniated disc   . Anxiety   . Heart murmur   . Hyperlipidemia   . Lung tumor   . Rheumatoid arthritis ([place])   . Fibromyalgia   . Schizoaffective disorder [facility])     Past Surgical History  Procedure Laterality Date  . Brain tumor excision    . Lung lobectomy    . Cesarean section    .  Cardiac catheterization    . Cyst removal hand     Family History:  Family History  Problem Relation Age of Onset  . Heart disease Brother   . Depression Mother   . Heart attack Mother   . Stroke Mother   . Alcohol abuse Father   . Stroke Father   . Diabetes Sister   . Diabetes Sister   . Stomach cancer Sister   . Kidney disease Sister   . COPD Brother   . Lung cancer Brother   . Diabetes Brother    Social History:   Social History   Social History  . Marital Status: Single    Spouse Name: N/A  . Number of Children: N/A  . Years of Education: N/A   Social History Main Topics  . Smoking status: Never Smoker   . Smokeless tobacco: Never Used  . Alcohol Use: No  . Drug Use: No  . Sexual Activity: No   Other Topics Concern  . Not on file   Social History Narrative   Additional Social History:   Musculoskeletal: Strength & Muscle Tone: within normal limits Gait & Station: broad based Patient leans: N/A  Psychiatric Specialty Exam: HPI  ROS  There were no vitals taken for this visit.There is no weight on file to calculate BMI.  General Appearance: Casual  Eye Contact:  Fair  Speech:  Stilted but articulate  Volume:  Normal  Mood:  Euthymic  Affect:  Constricted  Thought Process:  Circumstantial  Orientation:  Full (Time, Place,  and Person)  Thought Content:  Paranoid Ideation and Rumination  Suicidal Thoughts:  No  Homicidal Thoughts:  No  Memory:  Immediate;   Fair Recent;   Fair Remote;   Fair  Judgement:  Fair  Insight:  Fair  Psychomotor Activity:  NA  Concentration:  Fair  Recall:  [employer] of Knowledge:Fair  Language: Fair  Akathisia:  No  Handed:  Right  AIMS (if indicated):  Some speech impediment, no cog wheeling  Assets:  Communication Skills Desire for Improvement Financial Resources/Insurance Housing  ADL's:  Intact  Cognition: WNL  Sleep:  okay   Is the patient at risk to self?  No. Has the patient been a risk to self in  the past 6 months?  No. Has the patient been a risk to self within the distant past?  Yes.   Is the patient a risk to others?  No. Has the patient been a risk to others in the past 6 months?  No. Has the patient been a risk to others within the distant past?  No.  Allergies:   Allergies  Allergen Reactions  . Percocet [Oxycodone-Acetaminophen] Nausea And Vomiting, Diarrhea and Nausea Only  . Tramadol Hcl Diarrhea, Nausea Only and Nausea And Vomiting  . Vicodin [Hydrocodone-Acetaminophen] Nausea And Vomiting, Diarrhea and Nausea Only   Current Medications: Current Outpatient Prescriptions  Medication Sig Dispense Refill  . aspirin EC 81 MG tablet Take by mouth.    [name] atenolol (TENORMIN) 100 MG tablet Take 1 tablet by mouth  daily 90 tablet 0  . atorvastatin (LIPITOR) 20 MG tablet Take 1 tablet by mouth at  bedtime 90 tablet 0  . ferrous fumarate (HEMOCYTE - 106 MG FE) 325 (106 FE) MG TABS tablet Take 1 tablet by mouth.    . haloperidol (HALDOL) 2 MG tablet Take 3 tablets (6 mg total) by mouth at bedtime. 90 tablet 1  . ibuprofen (ADVIL,MOTRIN) 600 MG tablet Take 600-1,200 mg by mouth every 6 (six) hours as needed.    [name] lisinopril-hydrochlorothiazide (PRINZIDE,ZESTORETIC) 20-25 MG tablet Take 1 tablet by mouth  daily 90 tablet 0  . Multiple Vitamins-Minerals (MULTIVITAMIN WOMEN 50+) TABS Take by mouth.    [name] omeprazole (PRILOSEC) 20 MG capsule Take 1 capsule by mouth  daily 90 capsule 0  . sertraline (ZOLOFT) 25 MG tablet Take 1 tablet (25 mg total) by mouth daily. 30 tablet 2   No current facility-administered medications for this visit.    Previous Psychotropic Medications: Yes   Substance Abuse History in the last 12 months:  No.  Consequences of Substance Abuse: Negative  Medical Decision Making:  Established Problem, Stable/Improving (1), Review of Psycho-Social Stressors (1), Review or order clinical lab tests (1), Review and summation of old records (2) and Review of  Medication Regimen & Side Effects (2)  Treatment Plan Summary: Medication management  Paranoid schizophrenia Continue Haldol at 6 mg at bedtime. Patient denies any problems with side effects, aims was negative.  Anxiety Increase Zoloft to  50 mg by mouth daily Patient aware of benefits and side effects  Return to clinic in 3  month's time or call before if necessary.  Tarae Wooden 5/18/201712:08 PM

## 2016-01-29 DIAGNOSIS — G4733 Obstructive sleep apnea (adult) (pediatric): Secondary | ICD-10-CM | POA: Diagnosis not present

## 2016-02-14 ENCOUNTER — Telehealth: Payer: Self-pay | Admitting: Family Medicine

## 2016-02-14 DIAGNOSIS — K219 Gastro-esophageal reflux disease without esophagitis: Secondary | ICD-10-CM

## 2016-02-14 DIAGNOSIS — E785 Hyperlipidemia, unspecified: Secondary | ICD-10-CM

## 2016-02-14 DIAGNOSIS — I10 Essential (primary) hypertension: Secondary | ICD-10-CM

## 2016-02-14 MED ORDER — OMEPRAZOLE 20 MG PO CPDR: DELAYED_RELEASE_CAPSULE | ORAL | Status: DC

## 2016-02-14 MED ORDER — ATENOLOL 100 MG PO TABS: ORAL_TABLET | ORAL | Status: DC

## 2016-02-14 MED ORDER — LISINOPRIL-HYDROCHLOROTHIAZIDE 20-25 MG PO TABS: ORAL_TABLET | ORAL | Status: DC

## 2016-02-14 MED ORDER — ATORVASTATIN CALCIUM 20 MG PO TABS: ORAL_TABLET | ORAL | Status: DC

## 2016-02-14 NOTE — Telephone Encounter (Signed)
Patient scheduled appointment for 02-21-16 but will be completely out of her medications before then. Asking that you send lisinopril, atorvastatin, atenolol, and omeprazole to [facility].

## 2016-02-14 NOTE — Telephone Encounter (Signed)
Prescription for atenolol, atorvastatin, lisinopril, and omeprazole have been sent to [facility].

## 2016-02-14 NOTE — Telephone Encounter (Signed)
Left voice message informing prescription has been sent to pharmacy and to keep her appointment for next week.

## 2016-02-18 DIAGNOSIS — G4733 Obstructive sleep apnea (adult) (pediatric): Secondary | ICD-10-CM | POA: Diagnosis not present

## 2016-02-21 ENCOUNTER — Ambulatory Visit (INDEPENDENT_AMBULATORY_CARE_PROVIDER_SITE_OTHER): Payer: Medicare Other | Admitting: Family Medicine

## 2016-02-21 ENCOUNTER — Encounter: Payer: Self-pay | Admitting: Family Medicine

## 2016-02-21 VITALS — BP 128/74 | HR 77 | Temp 98.7°F | Resp 15 | Ht 62.0 in | Wt 267.6 lb

## 2016-02-21 DIAGNOSIS — I10 Essential (primary) hypertension: Secondary | ICD-10-CM

## 2016-02-21 DIAGNOSIS — E785 Hyperlipidemia, unspecified: Secondary | ICD-10-CM | POA: Diagnosis not present

## 2016-02-21 DIAGNOSIS — R7303 Prediabetes: Secondary | ICD-10-CM

## 2016-02-21 LAB — POCT GLYCOSYLATED HEMOGLOBIN (HGB A1C): Hemoglobin A1C: 6.9

## 2016-02-21 LAB — GLUCOSE, POCT (MANUAL RESULT ENTRY): POC Glucose: 127 mg/dl — AB (ref 70–99)

## 2016-02-21 NOTE — Progress Notes (Signed)
Name: Lauren [name]   MRN: [id number]    DOB: [birth year]   Date:02/21/2016       Progress Note  Subjective  Chief Complaint  Chief Complaint  Patient presents with  . Follow-up    Hyperlipidemia This is a chronic problem. The problem is uncontrolled. Recent lipid tests were reviewed and are high. Exacerbating diseases include obesity. Pertinent negatives include no chest pain, leg pain, myalgias or shortness of breath. Current antihyperlipidemic treatment includes statins.  Hypertension This is a chronic problem. The problem is unchanged. The problem is controlled. Pertinent negatives include no blurred vision, chest pain, headaches, palpitations or shortness of breath. Past treatments include ACE inhibitors, beta blockers and diuretics. There is no history of kidney disease, CAD/MI or CVA.  Gastroesophageal Reflux She reports no abdominal pain, no belching, no chest pain, no choking or no heartburn. This is a chronic problem. The current episode started more than 1 year ago (Has been on Omeprazole for over 1 year.). She has tried a PPI for the symptoms. Past procedures include an EGD.     Past Medical History  Diagnosis Date  . Hypertension   . Diabetes mellitus   . Herniated disc   . Anxiety   . Heart murmur   . Hyperlipidemia   . Lung tumor   . Rheumatoid arthritis ([place])   . Fibromyalgia   . Schizoaffective disorder [facility])     Past Surgical History  Procedure Laterality Date  . Brain tumor excision    . Lung lobectomy    . Cesarean section    . Cardiac catheterization    . Cyst removal hand      Family History  Problem Relation Age of Onset  . Heart disease Brother   . Depression Mother   . Heart attack Mother   . Stroke Mother   . Alcohol abuse Father   . Stroke Father   . Diabetes Sister   . Diabetes Sister   . Stomach cancer Sister   . Kidney disease Sister   . COPD Brother   . Lung cancer Brother   . Diabetes Brother     Social History   Social  History  . Marital Status: Single    Spouse Name: N/A  . Number of Children: N/A  . Years of Education: N/A   Occupational History  . Not on file.   Social History Main Topics  . Smoking status: Never Smoker   . Smokeless tobacco: Never Used  . Alcohol Use: No  . Drug Use: No  . Sexual Activity: No   Other Topics Concern  . Not on file   Social History Narrative     Current outpatient prescriptions:  .  aspirin EC 81 MG tablet, Take by mouth., Disp: , Rfl:  .  atenolol (TENORMIN) 100 MG tablet, Take 1 tablet by mouth  daily, Disp: 90 tablet, Rfl: 0 .  atorvastatin (LIPITOR) 20 MG tablet, Take 1 tablet by mouth at  bedtime, Disp: 90 tablet, Rfl: 0 .  haloperidol (HALDOL) 2 MG tablet, Take 3 tablets (6 mg total) by mouth at bedtime., Disp: 90 tablet, Rfl: 2 .  ibuprofen (ADVIL,MOTRIN) 600 MG tablet, Take 600-1,200 mg by mouth every 6 (six) hours as needed., Disp: , Rfl:  .  lisinopril-hydrochlorothiazide (PRINZIDE,ZESTORETIC) 20-25 MG tablet, Take 1 tablet by mouth  daily, Disp: 90 tablet, Rfl: 0 .  Multiple Vitamins-Minerals (MULTIVITAMIN WOMEN 50+) TABS, Take by mouth., Disp: , Rfl:  .  omeprazole (PRILOSEC) 20  MG capsule, Take 1 capsule by mouth  daily, Disp: 90 capsule, Rfl: 0 .  sertraline (ZOLOFT) 50 MG tablet, Take 1 tablet (50 mg total) by mouth daily., Disp: 90 tablet, Rfl: 1  Allergies  Allergen Reactions  . Oxycodone-Acetaminophen Nausea And Vomiting  . Percocet [Oxycodone-Acetaminophen] Nausea And Vomiting, Diarrhea and Nausea Only  . Tramadol Hcl Diarrhea, Nausea Only and Nausea And Vomiting  . Vicodin [Hydrocodone-Acetaminophen] Nausea And Vomiting, Diarrhea and Nausea Only    Review of Systems  Eyes: Negative for blurred vision.  Respiratory: Negative for choking and shortness of breath.   Cardiovascular: Negative for chest pain and palpitations.  Gastrointestinal: Negative for heartburn and abdominal pain.  Musculoskeletal: Negative for myalgias.   Neurological: Negative for headaches.    Objective  Filed Vitals:   02/21/16 1137  BP: 128/74  Pulse: 77  Temp: 98.7 F (37.1 C)  TempSrc: Oral  Resp: 15  Height: 5\' 2"  (1.575 m)  Weight: 267 lb 9.6 oz (121.383 kg)  SpO2: 95%    Physical Exam  Constitutional: She is oriented to person, place, and time and well-developed, well-nourished, and in no distress.  HENT:  Head: Normocephalic and atraumatic.  Cardiovascular: Normal rate, regular rhythm and normal heart sounds.   No murmur heard. Pulmonary/Chest: Effort normal and breath sounds normal. She has no wheezes.  Abdominal: Soft. Bowel sounds are normal. There is no tenderness.  Neurological: She is alert and oriented to person, place, and time.  Nursing note and vitals reviewed.      Assessment & Plan  1. Borderline diabetes Obtain A1c and will follow-up - POCT HgB A1C  2. Hyperlipidemia Elevated LDL cholesterol, patient continues on statin therapy. Repeat today - Lipid Profile - Comprehensive Metabolic Panel (CMET)  3. Essential hypertension Blood pressure stable and controlled on present antihypertensive therapy.   Chavy Avera Asad A. [facility] 02/21/2016 11:45 AM

## 2016-03-19 DIAGNOSIS — G4733 Obstructive sleep apnea (adult) (pediatric): Secondary | ICD-10-CM | POA: Diagnosis not present

## 2016-04-03 ENCOUNTER — Other Ambulatory Visit: Payer: Self-pay | Admitting: Family Medicine

## 2016-04-03 DIAGNOSIS — K219 Gastro-esophageal reflux disease without esophagitis: Secondary | ICD-10-CM

## 2016-04-03 DIAGNOSIS — E785 Hyperlipidemia, unspecified: Secondary | ICD-10-CM

## 2016-04-03 DIAGNOSIS — I10 Essential (primary) hypertension: Secondary | ICD-10-CM

## 2016-04-22 ENCOUNTER — Ambulatory Visit: Payer: 59 | Admitting: Psychiatry

## 2016-04-29 ENCOUNTER — Other Ambulatory Visit: Payer: Self-pay | Admitting: Psychiatry

## 2016-04-29 DIAGNOSIS — F200 Paranoid schizophrenia: Secondary | ICD-10-CM

## 2016-05-01 DIAGNOSIS — G4733 Obstructive sleep apnea (adult) (pediatric): Secondary | ICD-10-CM | POA: Diagnosis not present

## 2016-05-02 ENCOUNTER — Other Ambulatory Visit: Payer: Self-pay

## 2016-05-02 DIAGNOSIS — F200 Paranoid schizophrenia: Secondary | ICD-10-CM

## 2016-05-02 NOTE — Telephone Encounter (Signed)
pt has appt on 05-07-16. pt needs refill on haloperidol 2 mg

## 2016-05-07 ENCOUNTER — Encounter: Payer: Self-pay | Admitting: Psychiatry

## 2016-05-07 ENCOUNTER — Ambulatory Visit (INDEPENDENT_AMBULATORY_CARE_PROVIDER_SITE_OTHER): Payer: 59 | Admitting: Psychiatry

## 2016-05-07 ENCOUNTER — Other Ambulatory Visit: Payer: Self-pay | Admitting: Family Medicine

## 2016-05-07 VITALS — BP 152/87 | HR 73 | Temp 97.9°F | Ht 62.0 in | Wt 276.0 lb

## 2016-05-07 DIAGNOSIS — Z1231 Encounter for screening mammogram for malignant neoplasm of breast: Secondary | ICD-10-CM

## 2016-05-07 DIAGNOSIS — F200 Paranoid schizophrenia: Secondary | ICD-10-CM

## 2016-05-07 NOTE — Progress Notes (Signed)
Patient ID: Lauren [name], female   DOB: 31-May-1951, 65 y.o.   MRN: [id number]   Psychiatric Progress note  Patient Identification: Lauren [name] MRN:  [id number] Date of Evaluation:  05/07/2016 Chief Complaint:   Chief Complaint    Follow-up; Medication Refill     Visit Diagnosis: Anxiety Disorder, MDD, Schizophrenia Diagnosis:   Patient Active Problem List   Diagnosis Date Noted  . Hyperlipidemia [E78.5] 02/14/2016  . Gravida 1 [Z34.00] 10/26/2015  . Dentagra [K08.89] 10/26/2015  . Itch of skin [L29.9] 10/26/2015  . Sex counseling [Z70.9] 10/26/2015  . Excessive sweating [R61] 07/05/2015  . Acid reflux [K21.9] 06/29/2015  . Encounter for pre-employment examination [Z02.1] 06/29/2015  . Borderline diabetes [R73.03] 06/29/2015  . Cardiac murmur [R01.1] 06/29/2015  . 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 Stiffness of both knees [M25.661, M25.662] 06/22/2015  . Insomnia w/ sleep apnea [G47.00, G47.30] 03/21/2015  . Anxiety disorder [F41.9] 03/21/2015  . BP (high blood pressure) [I10] 03/21/2015  . HLD (hyperlipidemia) [E78.5] 03/21/2015  . Paranoid schizophrenia ([name]) [F20.0] 03/21/2015  . Urge incontinence [N39.41] 03/21/2015  . Breathlessness on exertion [R06.09] 02/02/2014  . Awareness of heartbeats [R00.2] 02/02/2014  . Apnea, sleep [G47.30] 02/02/2014   History of Present Illness:  Patient is a 65 yo AAF with Paranoid Schizophrenia here for follow up. She reports doing better. Tolerating the zoloft well at 50mg . States her anxiety is better but her son moved back with her and he causes some stress per patient.  She denies hearing voices or seeing things. Denies any suicidal thoughts.  Past Medical History:  Past Medical History:  Diagnosis Date  . Anxiety   . Diabetes mellitus   . Fibromyalgia   . Heart murmur   . Herniated disc   . Hyperlipidemia   . Hypertension   . Lung tumor   . Rheumatoid arthritis ([place])   . Schizoaffective disorder  [facility])     Past Surgical History:  Procedure Laterality Date  . BRAIN TUMOR EXCISION    . CARDIAC CATHETERIZATION    . CESAREAN SECTION    . CYST REMOVAL HAND    . LUNG LOBECTOMY     Family History:  Family History  Problem Relation Age of Onset  . Heart disease Brother   . Depression Mother   . Heart attack Mother   . Stroke Mother   . Alcohol abuse Father   . Stroke Father   . Diabetes Sister   . Diabetes Sister   . Stomach cancer Sister   . Kidney disease Sister   . COPD Brother   . Lung cancer Brother   . Diabetes Brother    Social History:   Social History   Social History  . Marital status: Single    Spouse name: N/A  . Number of children: N/A  . Years of education: N/A   Social History Main Topics  . Smoking status: Never Smoker  . Smokeless tobacco: Never Used  . Alcohol use No  . Drug use: No  . Sexual activity: No   Other Topics Concern  . None   Social History Narrative  . None   Additional Social History:   Musculoskeletal: Strength & Muscle Tone: within normal limits Gait & Station: broad based Patient leans: N/A  Psychiatric Specialty Exam: HPI  ROS  Blood pressure (!) 152/87, pulse 73, temperature 97.9 F (36.6 C), temperature source Oral, height 5\' 2"  (1.575 m), weight 276 lb (125.2 kg).Body mass index is 50.48 kg/m.  General Appearance: Casual  Eye Contact:  Fair  Speech:  Stilted but articulate  Volume:  Normal  Mood:  Euthymic  Affect:  Constricted  Thought Process:  Circumstantial  Orientation:  Full (Time, Place, and Person)  Thought Content:  Paranoid Ideation and Rumination  Suicidal Thoughts:  No  Homicidal Thoughts:  No  Memory:  Immediate;   Fair Recent;   Fair Remote;   Fair  Judgement:  Fair  Insight:  Fair  Psychomotor Activity:  NA  Concentration:  Fair  Recall:  [employer] of Knowledge:Fair  Language: Fair  Akathisia:  No  Handed:  Right  AIMS (if indicated):  Some speech impediment, no cog wheeling   Assets:  Communication Skills Desire for Improvement Financial Resources/Insurance Housing  ADL's:  Intact  Cognition: WNL  Sleep:  okay   Is the patient at risk to self?  No. Has the patient been a risk to self in the past 6 months?  No. Has the patient been a risk to self within the distant past?  Yes.   Is the patient a risk to others?  No. Has the patient been a risk to others in the past 6 months?  No. Has the patient been a risk to others within the distant past?  No.  Allergies:   Allergies  Allergen Reactions  . Oxycodone-Acetaminophen Nausea And Vomiting  . Percocet [Oxycodone-Acetaminophen] Nausea And Vomiting, Diarrhea and Nausea Only  . Tramadol Hcl Diarrhea, Nausea Only and Nausea And Vomiting  . Vicodin [Hydrocodone-Acetaminophen] Nausea And Vomiting, Diarrhea and Nausea Only   Current Medications: Current Outpatient Prescriptions  Medication Sig Dispense Refill  . aspirin EC 81 MG tablet Take by mouth.    [name] atenolol (TENORMIN) 100 MG tablet Take 1 tablet by mouth  daily 90 tablet 0  . atorvastatin (LIPITOR) 20 MG tablet Take 1 tablet by mouth at  bedtime 90 tablet 0  . haloperidol (HALDOL) 2 MG tablet Take 3 tablets (6 mg total) by mouth at bedtime. 90 tablet 2  . ibuprofen (ADVIL,MOTRIN) 600 MG tablet Take 600-1,200 mg by mouth every 6 (six) hours as needed.    [name] lisinopril-hydrochlorothiazide (PRINZIDE,ZESTORETIC) 20-25 MG tablet Take 1 tablet by mouth  daily 90 tablet 0  . Multiple Vitamins-Minerals (MULTIVITAMIN WOMEN 50+) TABS Take by mouth.    [name] omeprazole (PRILOSEC) 20 MG capsule Take 1 capsule by mouth  daily 90 capsule 0  . sertraline (ZOLOFT) 50 MG tablet Take 1 tablet (50 mg total) by mouth daily. 90 tablet 1   No current facility-administered medications for this visit.     Previous Psychotropic Medications: Yes   Substance Abuse History in the last 12 months:  No.  Consequences of Substance Abuse: Negative  Medical Decision Making:   Established Problem, Stable/Improving (1), Review of Psycho-Social Stressors (1), Review or order clinical lab tests (1), Review and summation of old records (2) and Review of Medication Regimen & Side Effects (2)  Treatment Plan Summary: Medication management  Paranoid schizophrenia Continue Haldol at 6 mg at bedtime. Patient denies any problems with side effects, aims was negative.  Anxiety Continue Zoloft at  50 mg by mouth daily. Patient aware of benefits and side effects Discussed deep breathing exercises for patient to deal with her stress.  Return to clinic in 2 month's time or call before if necessary.  Percival Glasheen 8/29/201711:41 AM

## 2016-05-23 ENCOUNTER — Ambulatory Visit: Payer: Self-pay | Admitting: Family Medicine

## 2016-05-28 ENCOUNTER — Ambulatory Visit: Payer: Medicare Other | Attending: Family Medicine

## 2016-05-29 ENCOUNTER — Ambulatory Visit: Payer: Self-pay | Admitting: Family Medicine

## 2016-05-30 ENCOUNTER — Ambulatory Visit (INDEPENDENT_AMBULATORY_CARE_PROVIDER_SITE_OTHER): Payer: Medicare Other | Admitting: Family Medicine

## 2016-05-30 ENCOUNTER — Encounter: Payer: Self-pay | Admitting: Family Medicine

## 2016-05-30 VITALS — BP 118/66 | HR 74 | Temp 98.3°F | Resp 16 | Ht 62.0 in | Wt 268.1 lb

## 2016-05-30 DIAGNOSIS — I10 Essential (primary) hypertension: Secondary | ICD-10-CM | POA: Diagnosis not present

## 2016-05-30 DIAGNOSIS — E119 Type 2 diabetes mellitus without complications: Secondary | ICD-10-CM

## 2016-05-30 DIAGNOSIS — Z23 Encounter for immunization: Secondary | ICD-10-CM | POA: Diagnosis not present

## 2016-05-30 DIAGNOSIS — E785 Hyperlipidemia, unspecified: Secondary | ICD-10-CM | POA: Diagnosis not present

## 2016-05-30 LAB — GLUCOSE, POCT (MANUAL RESULT ENTRY): POC Glucose: 120 mg/dl — AB (ref 70–99)

## 2016-05-30 LAB — POCT UA - MICROALBUMIN
Albumin/Creatinine Ratio, Urine, POC: NEGATIVE
Creatinine, POC: NEGATIVE mg/dL
Microalbumin Ur, POC: NEGATIVE mg/L

## 2016-05-30 LAB — POCT GLYCOSYLATED HEMOGLOBIN (HGB A1C): Hemoglobin A1C: 6.5

## 2016-05-30 MED ORDER — ATENOLOL 100 MG PO TABS: 100.0000 mg | ORAL_TABLET | Freq: Every day | ORAL | 0 refills | Status: DC

## 2016-05-30 MED ORDER — ATORVASTATIN CALCIUM 20 MG PO TABS: 20.0000 mg | ORAL_TABLET | Freq: Every day | ORAL | 0 refills | Status: DC

## 2016-05-30 MED ORDER — LISINOPRIL-HYDROCHLOROTHIAZIDE 20-25 MG PO TABS: 1.0000 | ORAL_TABLET | Freq: Every day | ORAL | 0 refills | Status: DC

## 2016-05-30 NOTE — Progress Notes (Signed)
Name: Lauren [name]   MRN: [id number]    DOB: 09-Jan-1951   Date:05/30/2016       Progress Note  Subjective  Chief Complaint  Chief Complaint  Patient presents with  . Flu Vaccine  . borderline diabetes    3 month follow up    Diabetes  She presents for her follow-up diabetic visit. She has type 2 diabetes mellitus. Her disease course has been stable. Pertinent negatives for hypoglycemia include no headaches. Pertinent negatives for diabetes include no blurred vision, no chest pain, no fatigue, no polydipsia and no polyuria. Pertinent negatives for diabetic complications include no CVA. Current diabetic treatment includes diet. She is following a diabetic diet. She rarely participates in exercise. Frequency home blood tests: pt. does not have blood glucose monitoring supplies. An ACE inhibitor/angiotensin II receptor blocker is being taken.  Hypertension  This is a chronic problem. The problem is controlled. Associated symptoms include malaise/fatigue and shortness of breath (with exertion such as after taking a long walk). Pertinent negatives include no blurred vision, chest pain, headaches or palpitations. Past treatments include ACE inhibitors, diuretics and beta blockers. There is no history of kidney disease, CAD/MI or CVA.  Hyperlipidemia  This is a chronic problem. The problem is uncontrolled. Recent lipid tests were reviewed and are high. Exacerbating diseases include diabetes and obesity. Associated symptoms include shortness of breath (with exertion such as after taking a long walk). Pertinent negatives include no chest pain or myalgias. Current antihyperlipidemic treatment includes statins.     Past Medical History:  Diagnosis Date  . Anxiety   . Diabetes mellitus   . Fibromyalgia   . Heart murmur   . Herniated disc   . Hyperlipidemia   . Hypertension   . Lung tumor   . Rheumatoid arthritis ([place])   . Schizoaffective disorder [facility])     Past Surgical History:   Procedure Laterality Date  . BRAIN TUMOR EXCISION    . CARDIAC CATHETERIZATION    . CESAREAN SECTION    . CYST REMOVAL HAND    . LUNG LOBECTOMY      Family History  Problem Relation Age of Onset  . Heart disease Brother   . Depression Mother   . Heart attack Mother   . Stroke Mother   . Alcohol abuse Father   . Stroke Father   . Diabetes Sister   . Diabetes Sister   . Stomach cancer Sister   . Kidney disease Sister   . COPD Brother   . Lung cancer Brother   . Diabetes Brother     Social History   Social History  . Marital status: Single    Spouse name: N/A  . Number of children: N/A  . Years of education: N/A   Occupational History  . Not on file.   Social History Main Topics  . Smoking status: Never Smoker  . Smokeless tobacco: Never Used  . Alcohol use No  . Drug use: No  . Sexual activity: No   Other Topics Concern  . Not on file   Social History Narrative  . No narrative on file     Current Outpatient Prescriptions:  .  aspirin EC 81 MG tablet, Take by mouth., Disp: , Rfl:  .  atenolol (TENORMIN) 100 MG tablet, Take 1 tablet by mouth  daily, Disp: 90 tablet, Rfl: 0 .  atorvastatin (LIPITOR) 20 MG tablet, Take 1 tablet by mouth at  bedtime, Disp: 90 tablet, Rfl: 0 .  haloperidol (  HALDOL) 2 MG tablet, Take 3 tablets (6 mg total) by mouth at bedtime., Disp: 90 tablet, Rfl: 2 .  ibuprofen (ADVIL,MOTRIN) 600 MG tablet, Take 600-1,200 mg by mouth every 6 (six) hours as needed., Disp: , Rfl:  .  lisinopril-hydrochlorothiazide (PRINZIDE,ZESTORETIC) 20-25 MG tablet, Take 1 tablet by mouth  daily, Disp: 90 tablet, Rfl: 0 .  Multiple Vitamins-Minerals (MULTIVITAMIN WOMEN 50+) TABS, Take by mouth., Disp: , Rfl:  .  omeprazole (PRILOSEC) 20 MG capsule, Take 1 capsule by mouth  daily, Disp: 90 capsule, Rfl: 0 .  sertraline (ZOLOFT) 50 MG tablet, Take 1 tablet (50 mg total) by mouth daily., Disp: 90 tablet, Rfl: 1  Allergies  Allergen Reactions  .  Oxycodone-Acetaminophen Nausea And Vomiting  . Percocet [Oxycodone-Acetaminophen] Nausea And Vomiting, Diarrhea and Nausea Only  . Tramadol Hcl Diarrhea, Nausea Only and Nausea And Vomiting  . Vicodin [Hydrocodone-Acetaminophen] Nausea And Vomiting, Diarrhea and Nausea Only     Review of Systems  Constitutional: Positive for malaise/fatigue. Negative for fatigue.  Eyes: Negative for blurred vision and double vision.  Respiratory: Positive for shortness of breath (with exertion such as after taking a long walk).   Cardiovascular: Negative for chest pain and palpitations.  Musculoskeletal: Negative for myalgias.  Neurological: Negative for headaches.  Endo/Heme/Allergies: Negative for polydipsia.    Objective  Vitals:   05/30/16 1202  BP: 118/66  Pulse: 74  Resp: 16  Temp: 98.3 F (36.8 C)  SpO2: 93%  Weight: 268 lb 2 oz (121.6 kg)  Height: 5\' 2"  (1.575 m)    Physical Exam  Constitutional: She is well-developed, well-nourished, and in no distress.  HENT:  Head: Normocephalic and atraumatic.  Cardiovascular: Normal rate, regular rhythm, S1 normal, S2 normal and normal heart sounds.   No murmur heard. Pulmonary/Chest: Breath sounds normal. She has no wheezes.  Musculoskeletal:       Right ankle: She exhibits no swelling.       Left ankle: She exhibits no swelling.  Psychiatric: Mood, memory and judgment normal. She has a flat affect.  Nursing note and vitals reviewed.      Recent Results (from the past 2160 hour(s))  POCT Glucose (CBG)     Status: Abnormal   Collection Time: 05/30/16 12:12 PM  Result Value Ref Range   POC Glucose 120 (A) 70 - 99 mg/dl  POCT HgB A1C     Status: None   Collection Time: 05/30/16 12:16 PM  Result Value Ref Range   Hemoglobin A1C 6.5      Assessment & Plan  1. Controlled type 2 diabetes mellitus without complication, without long-term current use of insulin (HCC) Point-of-care A1c 6.5%, consistent with well-controlled diabetes.  No pharmacotherapy, patient able to control with diet and lifestyle changes alone - POCT HgB A1C - POCT Glucose (CBG) - POCT UA - Microalbumin  2. Hyperlipidemia  - atorvastatin (LIPITOR) 20 MG tablet; Take 1 tablet (20 mg total) by mouth at bedtime.  Dispense: 90 tablet; Refill: 0 - Lipid Profile - COMPLETE METABOLIC PANEL WITH GFR  3. Essential hypertension BP stable and controlled on present anti-hypertensive therapy. - atenolol (TENORMIN) 100 MG tablet; Take 1 tablet (100 mg total) by mouth daily.  Dispense: 90 tablet; Refill: 0 - lisinopril-hydrochlorothiazide (PRINZIDE,ZESTORETIC) 20-25 MG tablet; Take 1 tablet by mouth daily.  Dispense: 90 tablet; Refill: 0   Gerell Fortson [facility] 05/30/2016 12:23 PM

## 2016-06-17 ENCOUNTER — Other Ambulatory Visit: Payer: Self-pay | Admitting: Psychiatry

## 2016-06-20 ENCOUNTER — Other Ambulatory Visit: Payer: Self-pay

## 2016-06-20 DIAGNOSIS — F200 Paranoid schizophrenia: Secondary | ICD-10-CM

## 2016-06-20 MED ORDER — HALOPERIDOL 2 MG PO TABS: 6.0000 mg | ORAL_TABLET | Freq: Every day | ORAL | 0 refills | Status: DC

## 2016-06-20 NOTE — Telephone Encounter (Signed)
request was sent over to refill haloperidol pt was not given a full months supply.  your instruction was to take 3 tablets a at bedtime but you didn't give enough refills to last.

## 2016-06-27 DIAGNOSIS — E785 Hyperlipidemia, unspecified: Secondary | ICD-10-CM | POA: Diagnosis not present

## 2016-06-28 LAB — COMPREHENSIVE METABOLIC PANEL
ALT: 17 IU/L (ref 0–32)
AST: 21 IU/L (ref 0–40)
Albumin/Globulin Ratio: 1.6 (ref 1.2–2.2)
Albumin: 4.1 g/dL (ref 3.6–4.8)
Alkaline Phosphatase: 70 IU/L (ref 39–117)
BUN/Creatinine Ratio: 24 (ref 12–28)
BUN: 17 mg/dL (ref 8–27)
Bilirubin Total: 0.5 mg/dL (ref 0.0–1.2)
CO2: 26 mmol/L (ref 18–29)
Calcium: 9.7 mg/dL (ref 8.7–10.3)
Chloride: 103 mmol/L (ref 96–106)
Creatinine, Ser: 0.72 mg/dL (ref 0.57–1.00)
GFR calc Af Amer: 103 mL/min/{1.73_m2} (ref >59)
GFR calc non Af Amer: 89 mL/min/{1.73_m2} (ref >59)
Globulin, Total: 2.6 g/dL (ref 1.5–4.5)
Glucose: 101 mg/dL — ABNORMAL HIGH (ref 65–99)
Potassium: 4.4 mmol/L (ref 3.5–5.2)
Sodium: 144 mmol/L (ref 134–144)
Total Protein: 6.7 g/dL (ref 6.0–8.5)

## 2016-06-28 LAB — LIPID PANEL
Chol/HDL Ratio: 4.1 ratio units (ref 0.0–4.4)
Cholesterol, Total: 203 mg/dL — ABNORMAL HIGH (ref 100–199)
HDL: 49 mg/dL (ref >39)
LDL Calculated: 138 mg/dL — ABNORMAL HIGH (ref 0–99)
Triglycerides: 78 mg/dL (ref 0–149)
VLDL Cholesterol Cal: 16 mg/dL (ref 5–40)

## 2016-07-02 ENCOUNTER — Telehealth: Payer: Self-pay

## 2016-07-02 MED ORDER — ATORVASTATIN CALCIUM 40 MG PO TABS: 40.0000 mg | ORAL_TABLET | Freq: Every day | ORAL | 0 refills | Status: DC

## 2016-07-02 NOTE — Telephone Encounter (Signed)
Patient has been notified of lab results and a prescription for atorvastatin 40 mg at bedtime has been sent to [place] per Dr. Manuella Ghazi, patient has been notified and verbalized understanding

## 2016-07-04 ENCOUNTER — Encounter: Payer: Self-pay | Admitting: Psychiatry

## 2016-07-04 ENCOUNTER — Ambulatory Visit (INDEPENDENT_AMBULATORY_CARE_PROVIDER_SITE_OTHER): Payer: 59 | Admitting: Psychiatry

## 2016-07-04 VITALS — BP 160/94 | HR 65 | Wt 268.8 lb

## 2016-07-04 DIAGNOSIS — F200 Paranoid schizophrenia: Secondary | ICD-10-CM

## 2016-07-04 MED ORDER — HALOPERIDOL 2 MG PO TABS: 6.0000 mg | ORAL_TABLET | Freq: Every day | ORAL | 0 refills | Status: DC

## 2016-07-04 MED ORDER — SERTRALINE HCL 50 MG PO TABS: 50.0000 mg | ORAL_TABLET | Freq: Every day | ORAL | 1 refills | Status: DC

## 2016-07-04 NOTE — Progress Notes (Signed)
Patient ID: [name], female   DOB: 09/01/1951, 65 y.o.   MRN: [id number]   Psychiatric Progress note  Patient Identification: Lauren [name] MRN:  [id number] Date of Evaluation:  [name] Chief Complaint:   Chief Complaint    Follow-up; Medication Refill     Visit Diagnosis: Anxiety Disorder, MDD, Schizophrenia  History of Present Illness:  Patient is a 65 yo AAF with Paranoid Schizophrenia here for follow up. She reports doing better. Tolerating the zoloft well at 50mg . Patient reports doing well overall. States that her son is living with her and he also has schizophrenia. States that she sleeps during the day given that she is to work third shift previously. States that he comes to wake her up and talk to her about things and that's the only concern she has. She is a little stressed about the holidays but doing well overall. Denies any suicidal thoughts.  Past Medical History:  Past Medical History:  Diagnosis Date  . Anxiety   . Diabetes mellitus   . Fibromyalgia   . Heart murmur   . Herniated disc   . Hyperlipidemia   . Hypertension   . Lung tumor   . Rheumatoid arthritis ([place])   . Schizoaffective disorder [facility])     Past Surgical History:  Procedure Laterality Date  . BRAIN TUMOR EXCISION    . CARDIAC CATHETERIZATION    . CESAREAN SECTION    . CYST REMOVAL HAND    . LUNG LOBECTOMY     Family History:  Family History  Problem Relation Age of Onset  . Heart disease Brother   . Depression Mother   . Heart attack Mother   . Stroke Mother   . Alcohol abuse Father   . Stroke Father   . Diabetes Sister   . Diabetes Sister   . Stomach cancer Sister   . Kidney disease Sister   . COPD Brother   . Lung cancer Brother   . Diabetes Brother    Social History:   Social History   Social History  . Marital status: Single    Spouse name: N/A  . Number of children: N/A  . Years of education: N/A   Social History Main Topics  . Smoking status: Never  Smoker  . Smokeless tobacco: Never Used  . Alcohol use No  . Drug use: No  . Sexual activity: No   Other Topics Concern  . None   Social History Narrative  . None   Additional Social History:   Musculoskeletal: Strength & Muscle Tone: within normal limits Gait & Station: broad based Patient leans: N/A  Psychiatric Specialty Exam: Medication Refill     ROS  Blood pressure (!) 160/94, pulse 65, weight 268 lb 12.8 oz (121.9 kg).Body mass index is 49.16 kg/m.  General Appearance: Casual  Eye Contact:  Fair  Speech:  Stilted but articulate  Volume:  Normal  Mood:  Euthymic  Affect:  Constricted  Thought Process:  Circumstantial  Orientation:  Full (Time, Place, and Person)  Thought Content:  Paranoid Ideation and Rumination  Suicidal Thoughts:  No  Homicidal Thoughts:  No  Memory:  Immediate;   Fair Recent;   Fair Remote;   Fair  Judgement:  Fair  Insight:  Fair  Psychomotor Activity:  NA  Concentration:  Fair  Recall:  [employer] of Knowledge:Fair  Language: Fair  Akathisia:  No  Handed:  Right  AIMS (if indicated):  Some speech impediment, no cog wheeling  Assets:  Communication Skills Desire for Improvement Financial Resources/Insurance Housing  ADL's:  Intact  Cognition: WNL  Sleep:  okay   Is the patient at risk to self?  No. Has the patient been a risk to self in the past 6 months?  No. Has the patient been a risk to self within the distant past?  Yes.   Is the patient a risk to others?  No. Has the patient been a risk to others in the past 6 months?  No. Has the patient been a risk to others within the distant past?  No.  Allergies:   Allergies  Allergen Reactions  . Oxycodone-Acetaminophen Nausea And Vomiting  . Percocet [Oxycodone-Acetaminophen] Nausea And Vomiting, Diarrhea and Nausea Only  . Tramadol Hcl Diarrhea, Nausea Only and Nausea And Vomiting  . Vicodin [Hydrocodone-Acetaminophen] Nausea And Vomiting, Diarrhea and Nausea Only    Current Medications: Current Outpatient Prescriptions  Medication Sig Dispense Refill  . aspirin EC 81 MG tablet Take by mouth.    [name] atenolol (TENORMIN) 100 MG tablet Take 1 tablet (100 mg total) by mouth daily. 90 tablet 0  . atorvastatin (LIPITOR) 40 MG tablet Take 1 tablet (40 mg total) by mouth at bedtime. 90 tablet 0  . haloperidol (HALDOL) 2 MG tablet Take 3 tablets (6 mg total) by mouth at bedtime. 270 tablet 0  . ibuprofen (ADVIL,MOTRIN) 600 MG tablet Take 600-1,200 mg by mouth every 6 (six) hours as needed.    [name] lisinopril-hydrochlorothiazide (PRINZIDE,ZESTORETIC) 20-25 MG tablet Take 1 tablet by mouth daily. 90 tablet 0  . Multiple Vitamins-Minerals (MULTIVITAMIN WOMEN 50+) TABS Take by mouth.    [name] omeprazole (PRILOSEC) 20 MG capsule Take 1 capsule by mouth  daily 90 capsule 0  . sertraline (ZOLOFT) 50 MG tablet Take 1 tablet (50 mg total) by mouth daily. 90 tablet 1   No current facility-administered medications for this visit.     Previous Psychotropic Medications: Yes   Substance Abuse History in the last 12 months:  No.  Consequences of Substance Abuse: Negative  Medical Decision Making:  Established Problem, Stable/Improving (1), Review of Psycho-Social Stressors (1), Review or order clinical lab tests (1), Review and summation of old records (2) and Review of Medication Regimen & Side Effects (2)  Treatment Plan Summary: Medication management  Paranoid schizophrenia Continue Haldol at 6 mg at bedtime. Patient denies any problems with side effects, aims was negative.  Anxiety Continue Zoloft at  50 mg by mouth daily. Patient aware of benefits and side effects Discussed deep breathing exercises for patient to deal with her stress.  Return to clinic in 3 month's time or call before if necessary.  Lauren [name] 10/26/201711:07 AM

## 2016-08-09 ENCOUNTER — Telehealth: Payer: Self-pay

## 2016-08-09 DIAGNOSIS — K219 Gastro-esophageal reflux disease without esophagitis: Secondary | ICD-10-CM

## 2016-08-09 DIAGNOSIS — I10 Essential (primary) hypertension: Secondary | ICD-10-CM

## 2016-08-09 MED ORDER — OMEPRAZOLE 20 MG PO CPDR: 20.0000 mg | DELAYED_RELEASE_CAPSULE | Freq: Every day | ORAL | 0 refills | Status: DC

## 2016-08-09 MED ORDER — LISINOPRIL-HYDROCHLOROTHIAZIDE 20-25 MG PO TABS: 1.0000 | ORAL_TABLET | Freq: Every day | ORAL | 0 refills | Status: DC

## 2016-08-09 NOTE — Telephone Encounter (Signed)
Medication has been refilled and sent to [facility] 

## 2016-08-13 DIAGNOSIS — G4733 Obstructive sleep apnea (adult) (pediatric): Secondary | ICD-10-CM | POA: Diagnosis not present

## 2016-08-20 ENCOUNTER — Other Ambulatory Visit: Payer: Self-pay | Admitting: Family Medicine

## 2016-08-30 ENCOUNTER — Ambulatory Visit: Payer: Self-pay | Admitting: Family Medicine

## 2016-09-05 ENCOUNTER — Ambulatory Visit: Payer: Self-pay | Admitting: Family Medicine

## 2016-09-16 ENCOUNTER — Telehealth: Payer: Self-pay | Admitting: Family Medicine

## 2016-09-16 NOTE — Telephone Encounter (Signed)
Requesting refill on atenolol 100 mg and states that [facility] is out of stock asking that you send [facility]. Pt is completely out

## 2016-09-17 ENCOUNTER — Other Ambulatory Visit: Payer: Self-pay | Admitting: Emergency Medicine

## 2016-09-17 DIAGNOSIS — I10 Essential (primary) hypertension: Secondary | ICD-10-CM

## 2016-09-17 MED ORDER — ATENOLOL 100 MG PO TABS: 100.0000 mg | ORAL_TABLET | Freq: Every day | ORAL | 0 refills | Status: DC

## 2016-09-17 MED ORDER — ATENOLOL 100 MG PO TABS
100.0000 mg | ORAL_TABLET | Freq: Every day | ORAL | 0 refills | Status: DC
Start: 2016-09-17 — End: 2016-10-18

## 2016-09-17 NOTE — Telephone Encounter (Signed)
30 day supply sent to [facility]

## 2016-09-18 DIAGNOSIS — G4733 Obstructive sleep apnea (adult) (pediatric): Secondary | ICD-10-CM | POA: Diagnosis not present

## 2016-09-24 ENCOUNTER — Ambulatory Visit: Payer: 59 | Admitting: Psychiatry

## 2016-10-18 ENCOUNTER — Encounter: Payer: Self-pay | Admitting: Family Medicine

## 2016-10-18 ENCOUNTER — Ambulatory Visit (INDEPENDENT_AMBULATORY_CARE_PROVIDER_SITE_OTHER): Payer: Medicare Other | Admitting: Family Medicine

## 2016-10-18 VITALS — BP 126/80 | HR 92 | Temp 97.9°F | Resp 16 | Ht 62.0 in | Wt 272.8 lb

## 2016-10-18 DIAGNOSIS — I10 Essential (primary) hypertension: Secondary | ICD-10-CM

## 2016-10-18 DIAGNOSIS — K219 Gastro-esophageal reflux disease without esophagitis: Secondary | ICD-10-CM | POA: Diagnosis not present

## 2016-10-18 DIAGNOSIS — F200 Paranoid schizophrenia: Secondary | ICD-10-CM

## 2016-10-18 DIAGNOSIS — E7800 Pure hypercholesterolemia, unspecified: Secondary | ICD-10-CM | POA: Diagnosis not present

## 2016-10-18 MED ORDER — HALOPERIDOL 2 MG PO TABS: 6.0000 mg | ORAL_TABLET | Freq: Every day | ORAL | 0 refills | Status: DC

## 2016-10-18 MED ORDER — LISINOPRIL-HYDROCHLOROTHIAZIDE 20-25 MG PO TABS: 1.0000 | ORAL_TABLET | Freq: Every day | ORAL | 0 refills | Status: DC

## 2016-10-18 MED ORDER — ATORVASTATIN CALCIUM 40 MG PO TABS: 40.0000 mg | ORAL_TABLET | Freq: Every day | ORAL | 1 refills | Status: DC

## 2016-10-18 MED ORDER — ATENOLOL 100 MG PO TABS: 100.0000 mg | ORAL_TABLET | Freq: Every day | ORAL | 0 refills | Status: DC

## 2016-10-18 MED ORDER — OMEPRAZOLE 20 MG PO CPDR: 20.0000 mg | DELAYED_RELEASE_CAPSULE | Freq: Every day | ORAL | 0 refills | Status: DC

## 2016-10-18 NOTE — Progress Notes (Signed)
Name: Lauren [name]   MRN: [id number]    DOB: 22-Dec-1950   Date:10/18/2016       Progress Note  Subjective  Chief Complaint  Chief Complaint  Patient presents with  . Medication Refill  . Follow-up    Hypertension  This is a chronic problem. The problem is unchanged. Pertinent negatives include no blurred vision, chest pain, headaches, palpitations or shortness of breath. Past treatments include beta blockers, ACE inhibitors and diuretics. There is no history of kidney disease, CAD/MI or CVA.  Hyperlipidemia  This is a chronic problem. The problem is uncontrolled. Recent lipid tests were reviewed and are high. Pertinent negatives include no chest pain, leg pain, myalgias or shortness of breath. Current antihyperlipidemic treatment includes statins.  Gastroesophageal Reflux  She reports no abdominal pain, no chest pain, no dysphagia, no heartburn or no nausea. This is a chronic problem. The problem has been unchanged. Risk factors include obesity. She has tried a PPI for the symptoms.     Past Medical History:  Diagnosis Date  . Anxiety   . Diabetes mellitus   . Fibromyalgia   . Heart murmur   . Herniated disc   . Hyperlipidemia   . Hypertension   . Lung tumor   . Rheumatoid arthritis ([place])   . Schizoaffective disorder [facility])     Past Surgical History:  Procedure Laterality Date  . BRAIN TUMOR EXCISION    . CARDIAC CATHETERIZATION    . CESAREAN SECTION    . CYST REMOVAL HAND    . LUNG LOBECTOMY      Family History  Problem Relation Age of Onset  . Heart disease Brother   . Depression Mother   . Heart attack Mother   . Stroke Mother   . Alcohol abuse Father   . Stroke Father   . Diabetes Sister   . Diabetes Sister   . Stomach cancer Sister   . Kidney disease Sister   . COPD Brother   . Lung cancer Brother   . Diabetes Brother     Social History   Social History  . Marital status: Single    Spouse name: N/A  . Number of children: N/A  . Years of  education: N/A   Occupational History  . Not on file.   Social History Main Topics  . Smoking status: Never Smoker  . Smokeless tobacco: Never Used  . Alcohol use No  . Drug use: No  . Sexual activity: No   Other Topics Concern  . Not on file   Social History Narrative  . No narrative on file     Current Outpatient Prescriptions:  .  aspirin EC 81 MG tablet, Take by mouth., Disp: , Rfl:  .  atenolol (TENORMIN) 100 MG tablet, Take 1 tablet (100 mg total) by mouth daily., Disp: 30 tablet, Rfl: 0 .  atenolol (TENORMIN) 100 MG tablet, Take 1 tablet (100 mg total) by mouth daily., Disp: 30 tablet, Rfl: 0 .  atorvastatin (LIPITOR) 40 MG tablet, TAKE 1 TABLET BY MOUTH AT  BEDTIME, Disp: 90 tablet, Rfl: 1 .  haloperidol (HALDOL) 2 MG tablet, Take 3 tablets (6 mg total) by mouth at bedtime., Disp: 270 tablet, Rfl: 0 .  ibuprofen (ADVIL,MOTRIN) 600 MG tablet, Take 600-1,200 mg by mouth every 6 (six) hours as needed., Disp: , Rfl:  .  lisinopril-hydrochlorothiazide (PRINZIDE,ZESTORETIC) 20-25 MG tablet, Take 1 tablet by mouth daily., Disp: 90 tablet, Rfl: 0 .  Multiple Vitamins-Minerals (MULTIVITAMIN WOMEN 50+)  TABS, Take by mouth., Disp: , Rfl:  .  omeprazole (PRILOSEC) 20 MG capsule, Take 1 capsule (20 mg total) by mouth daily., Disp: 90 capsule, Rfl: 0 .  sertraline (ZOLOFT) 50 MG tablet, Take 1 tablet (50 mg total) by mouth daily., Disp: 90 tablet, Rfl: 1  Allergies  Allergen Reactions  . Oxycodone-Acetaminophen Nausea And Vomiting  . Percocet [Oxycodone-Acetaminophen] Nausea And Vomiting, Diarrhea and Nausea Only  . Tramadol Hcl Diarrhea, Nausea Only and Nausea And Vomiting  . Vicodin [Hydrocodone-Acetaminophen] Nausea And Vomiting, Diarrhea and Nausea Only     Review of Systems  Eyes: Negative for blurred vision.  Respiratory: Negative for shortness of breath.   Cardiovascular: Negative for chest pain and palpitations.  Gastrointestinal: Negative for abdominal pain, dysphagia,  heartburn and nausea.  Musculoskeletal: Negative for myalgias.  Neurological: Negative for headaches.    Objective  Vitals:   10/18/16 1129  BP: 126/80  Pulse: 92  Resp: 16  Temp: 97.9 F (36.6 C)  TempSrc: Oral  SpO2: 94%  Weight: 272 lb 12.8 oz (123.7 kg)  Height: 5\' 2"  (1.575 m)    Physical Exam  Constitutional: She is well-developed, well-nourished, and in no distress.  HENT:  Head: Normocephalic and atraumatic.  Cardiovascular: Normal rate, regular rhythm, S1 normal, S2 normal and normal heart sounds.   No murmur heard. Pulmonary/Chest: Breath sounds normal. She has no wheezes.  Musculoskeletal:       Right ankle: She exhibits no swelling.       Left ankle: She exhibits no swelling.  Psychiatric: Mood, memory and judgment normal. She has a flat affect.  Nursing note and vitals reviewed.    Assessment & Plan  1. Paranoid schizophrenia ([place]) Patient is seeking care with a new psychiatrist, requesting temporary refill for haloperidol, will provide a 30 day supply. - haloperidol (HALDOL) 2 MG tablet; Take 3 tablets (6 mg total) by mouth at bedtime.  Dispense: 90 tablet; Refill: 0  2. Essential hypertension  - atenolol (TENORMIN) 100 MG tablet; Take 1 tablet (100 mg total) by mouth daily.  Dispense: 90 tablet; Refill: 0 - lisinopril-hydrochlorothiazide (PRINZIDE,ZESTORETIC) 20-25 MG tablet; Take 1 tablet by mouth daily.  Dispense: 90 tablet; Refill: 0  3. Gastroesophageal reflux disease, esophagitis presence not specified  - omeprazole (PRILOSEC) 20 MG capsule; Take 1 capsule (20 mg total) by mouth daily.  Dispense: 90 capsule; Refill: 0  4. Pure hypercholesterolemia  - atorvastatin (LIPITOR) 40 MG tablet; Take 1 tablet (40 mg total) by mouth at bedtime.  Dispense: 90 tablet; Refill: 1   Charo Philipp [facility] 10/18/2016 11:32 AM

## 2016-10-25 ENCOUNTER — Ambulatory Visit: Payer: Self-pay | Admitting: Family Medicine

## 2016-11-12 DIAGNOSIS — G4733 Obstructive sleep apnea (adult) (pediatric): Secondary | ICD-10-CM | POA: Diagnosis not present

## 2016-11-29 ENCOUNTER — Ambulatory Visit (INDEPENDENT_AMBULATORY_CARE_PROVIDER_SITE_OTHER): Payer: 59 | Admitting: Psychiatry

## 2016-11-29 ENCOUNTER — Encounter: Payer: Self-pay | Admitting: Psychiatry

## 2016-11-29 VITALS — BP 175/83 | HR 83 | Temp 98.7°F | Wt 276.0 lb

## 2016-11-29 DIAGNOSIS — F200 Paranoid schizophrenia: Secondary | ICD-10-CM

## 2016-11-29 MED ORDER — HALOPERIDOL 2 MG PO TABS: 6.0000 mg | ORAL_TABLET | Freq: Every day | ORAL | 3 refills | Status: DC

## 2016-11-29 MED ORDER — SERTRALINE HCL 50 MG PO TABS: 50.0000 mg | ORAL_TABLET | Freq: Every day | ORAL | 1 refills | Status: DC

## 2016-11-29 NOTE — Progress Notes (Signed)
Patient ID: [name], female   DOB: 06/09/1951, 66 y.o.   MRN: [id number]   Psychiatric Progress note  Patient Identification: Lauren [name] MRN:  [id number] Date of Evaluation:  11/29/2016 Chief Complaint:  Some anxiety Chief Complaint    Follow-up; Medication Refill     Visit Diagnosis: Anxiety Disorder, MDD, Schizophrenia  History of Present Illness:  Patient is a 66 yo AAF with Paranoid Schizophrenia here for follow up. She reports doing okay, says she gets anxious. No particular trigger. Her BP is elevated today, states she is not eating well. States she realizes she needs to eat better.  Tolerating the zoloft well at 50mg . Son still living with her, states she would like for him to move out and says she cannot deal with him. Fair sleep and appetite.  Past Medical History:  Past Medical History:  Diagnosis Date  . Anxiety   . Diabetes mellitus   . Fibromyalgia   . Heart murmur   . Herniated disc   . Hyperlipidemia   . Hypertension   . Lung tumor   . Rheumatoid arthritis ([place])   . Schizoaffective disorder [facility])     Past Surgical History:  Procedure Laterality Date  . BRAIN TUMOR EXCISION    . CARDIAC CATHETERIZATION    . CESAREAN SECTION    . CYST REMOVAL HAND    . LUNG LOBECTOMY     Family History:  Family History  Problem Relation Age of Onset  . Heart disease Brother   . Depression Mother   . Heart attack Mother   . Stroke Mother   . Alcohol abuse Father   . Stroke Father   . Diabetes Sister   . Diabetes Sister   . Stomach cancer Sister   . Kidney disease Sister   . COPD Brother   . Lung cancer Brother   . Diabetes Brother    Social History:   Social History   Social History  . Marital status: Single    Spouse name: N/A  . Number of children: N/A  . Years of education: N/A   Social History Main Topics  . Smoking status: Never Smoker  . Smokeless tobacco: Never Used  . Alcohol use No  . Drug use: No  . Sexual activity: No    Other Topics Concern  . None   Social History Narrative  . None   Additional Social History:   Musculoskeletal: Strength & Muscle Tone: within normal limits Gait & Station: broad based Patient leans: N/A  Psychiatric Specialty Exam: Medication Refill     ROS  Blood pressure (!) 175/83, pulse 83, temperature 98.7 F (37.1 C), temperature source Oral, weight 276 lb (125.2 kg).Body mass index is 50.48 kg/m.  General Appearance: Casual  Eye Contact:  Fair  Speech:  Stilted but articulate  Volume:  Normal  Mood:  Euthymic  Affect:  Constricted  Thought Process:  Circumstantial  Orientation:  Full (Time, Place, and Person)  Thought Content:  Paranoid Ideation and Rumination  Suicidal Thoughts:  No  Homicidal Thoughts:  No  Memory:  Immediate;   Fair Recent;   Fair Remote;   Fair  Judgement:  Fair  Insight:  Fair  Psychomotor Activity:  NA  Concentration:  Fair  Recall:  [employer] of Knowledge:Fair  Language: Fair  Akathisia:  No  Handed:  Right  AIMS (if indicated):  Some speech impediment, no cog wheeling, done on 11/29/2016  Assets:  Communication Skills Desire for Improvement Financial  Resources/Insurance Housing  ADL's:  Intact  Cognition: WNL  Sleep:  okay   Is the patient at risk to self?  No. Has the patient been a risk to self in the past 6 months?  No. Has the patient been a risk to self within the distant past?  Yes.   Is the patient a risk to others?  No. Has the patient been a risk to others in the past 6 months?  No. Has the patient been a risk to others within the distant past?  No.  Allergies:   Allergies  Allergen Reactions  . Oxycodone-Acetaminophen Nausea And Vomiting  . Percocet [Oxycodone-Acetaminophen] Nausea And Vomiting, Diarrhea and Nausea Only  . Tramadol Hcl Diarrhea, Nausea Only and Nausea And Vomiting  . Vicodin [Hydrocodone-Acetaminophen] Nausea And Vomiting, Diarrhea and Nausea Only   Current Medications: Current  Outpatient Prescriptions  Medication Sig Dispense Refill  . aspirin EC 81 MG tablet Take by mouth.    [name] atenolol (TENORMIN) 100 MG tablet Take 1 tablet (100 mg total) by mouth daily. 90 tablet 0  . atorvastatin (LIPITOR) 40 MG tablet Take 1 tablet (40 mg total) by mouth at bedtime. 90 tablet 1  . haloperidol (HALDOL) 2 MG tablet Take 3 tablets (6 mg total) by mouth at bedtime. 90 tablet 0  . ibuprofen (ADVIL,MOTRIN) 600 MG tablet Take 600-1,200 mg by mouth every 6 (six) hours as needed.    [name] lisinopril-hydrochlorothiazide (PRINZIDE,ZESTORETIC) 20-25 MG tablet Take 1 tablet by mouth daily. 90 tablet 0  . Multiple Vitamins-Minerals (MULTIVITAMIN WOMEN 50+) TABS Take by mouth.    [name] omeprazole (PRILOSEC) 20 MG capsule Take 1 capsule (20 mg total) by mouth daily. 90 capsule 0  . sertraline (ZOLOFT) 50 MG tablet Take 1 tablet (50 mg total) by mouth daily. 90 tablet 1   No current facility-administered medications for this visit.     Previous Psychotropic Medications: Yes   Substance Abuse History in the last 12 months:  No.  Consequences of Substance Abuse: Negative  Medical Decision Making:  Established Problem, Stable/Improving (1), Review of Psycho-Social Stressors (1), Review or order clinical lab tests (1), Review and summation of old records (2) and Review of Medication Regimen & Side Effects (2)  Treatment Plan Summary: Medication management  Paranoid schizophrenia Continue Haldol at 6 mg at bedtime. Patient denies any problems with side effects, aims was negative.  Anxiety Continue Zoloft at  50 mg by mouth daily. Patient aware of benefits and side effects Discussed deep breathing exercises for patient to deal with her stress.  Return to clinic in 3 month's time or call before if necessary. Patient may transfer to Dr.Faheem since this clinician may not be credentialed for [place] [facility] which patient has.  Nahzir Pohle 3/23/201811:49 AM

## 2016-12-06 ENCOUNTER — Other Ambulatory Visit: Payer: Self-pay | Admitting: Family Medicine

## 2016-12-06 DIAGNOSIS — K219 Gastro-esophageal reflux disease without esophagitis: Secondary | ICD-10-CM

## 2016-12-06 DIAGNOSIS — I10 Essential (primary) hypertension: Secondary | ICD-10-CM

## 2016-12-13 DIAGNOSIS — M170 Bilateral primary osteoarthritis of knee: Secondary | ICD-10-CM | POA: Diagnosis not present

## 2016-12-20 ENCOUNTER — Ambulatory Visit (INDEPENDENT_AMBULATORY_CARE_PROVIDER_SITE_OTHER): Payer: Medicare Other | Admitting: Family Medicine

## 2016-12-20 ENCOUNTER — Encounter: Payer: Self-pay | Admitting: Family Medicine

## 2016-12-20 VITALS — BP 147/71 | HR 86 | Temp 98.0°F | Resp 17 | Ht 62.0 in | Wt 276.5 lb

## 2016-12-20 DIAGNOSIS — E7800 Pure hypercholesterolemia, unspecified: Secondary | ICD-10-CM | POA: Diagnosis not present

## 2016-12-20 DIAGNOSIS — E119 Type 2 diabetes mellitus without complications: Secondary | ICD-10-CM | POA: Diagnosis not present

## 2016-12-20 DIAGNOSIS — I10 Essential (primary) hypertension: Secondary | ICD-10-CM

## 2016-12-20 LAB — LIPID PANEL
Cholesterol: 170 mg/dL (ref <200)
HDL: 47 mg/dL — ABNORMAL LOW (ref >50)
LDL Cholesterol: 102 mg/dL — ABNORMAL HIGH (ref <100)
Total CHOL/HDL Ratio: 3.6 Ratio (ref <5.0)
Triglycerides: 104 mg/dL (ref <150)
VLDL: 21 mg/dL (ref <30)

## 2016-12-20 LAB — GLUCOSE, POCT (MANUAL RESULT ENTRY): POC Glucose: 122 mg/dl — AB (ref 70–99)

## 2016-12-20 LAB — POCT GLYCOSYLATED HEMOGLOBIN (HGB A1C): Hemoglobin A1C: 7.3

## 2016-12-20 MED ORDER — ATORVASTATIN CALCIUM 40 MG PO TABS: 40.0000 mg | ORAL_TABLET | Freq: Every day | ORAL | 1 refills | Status: DC

## 2016-12-20 MED ORDER — METFORMIN HCL 500 MG PO TABS: 500.0000 mg | ORAL_TABLET | Freq: Two times a day (BID) | ORAL | 3 refills | Status: DC

## 2016-12-20 MED ORDER — ATENOLOL 100 MG PO TABS: 100.0000 mg | ORAL_TABLET | Freq: Every day | ORAL | 0 refills | Status: DC

## 2016-12-20 MED ORDER — LISINOPRIL-HYDROCHLOROTHIAZIDE 20-25 MG PO TABS: 1.0000 | ORAL_TABLET | Freq: Every day | ORAL | 0 refills | Status: DC

## 2016-12-20 MED ORDER — METFORMIN HCL 500 MG PO TABS: 500.0000 mg | ORAL_TABLET | Freq: Two times a day (BID) | ORAL | 0 refills | Status: DC

## 2016-12-20 NOTE — Progress Notes (Signed)
Name: Lauren [name]   MRN: [id number]    DOB: 1951-05-10   Date:12/20/2016       Progress Note  Subjective  Chief Complaint  Chief Complaint  Patient presents with  . Follow-up    DM / BP    Diabetes  She presents for her follow-up diabetic visit. She has type 2 diabetes mellitus. Her disease course has been worsening. Pertinent negatives for hypoglycemia include no headaches, nervousness/anxiousness or sweats. Pertinent negatives for diabetes include no blurred vision, no chest pain (occasional chest pain), no fatigue, no polydipsia and no polyuria. Symptoms are stable. Pertinent negatives for diabetic complications include no CVA. Current diabetic treatment includes diet. She is following a generally unhealthy diet. She rarely participates in exercise. Frequency home blood tests: pt. does not have blood glucose monitoring supplies. An ACE inhibitor/angiotensin II receptor blocker is being taken.  Hypertension  This is a chronic problem. The problem is controlled. Associated symptoms include malaise/fatigue. Pertinent negatives include no blurred vision, chest pain (occasional chest pain), headaches, palpitations (occasional palpitations.), shortness of breath or sweats. Past treatments include ACE inhibitors, diuretics and beta blockers. There is no history of kidney disease, CAD/MI or CVA.  Hyperlipidemia  This is a chronic problem. The problem is uncontrolled. Recent lipid tests were reviewed and are high. Exacerbating diseases include diabetes and obesity. Pertinent negatives include no chest pain (occasional chest pain), myalgias or shortness of breath. Current antihyperlipidemic treatment includes statins.      Past Medical History:  Diagnosis Date  . Anxiety   . Diabetes mellitus   . Fibromyalgia   . Heart murmur   . Herniated disc   . Hyperlipidemia   . Hypertension   . Lung tumor   . Rheumatoid arthritis ([place])   . Schizoaffective disorder [facility])     Past Surgical  History:  Procedure Laterality Date  . BRAIN TUMOR EXCISION    . CARDIAC CATHETERIZATION    . CESAREAN SECTION    . CYST REMOVAL HAND    . LUNG LOBECTOMY      Family History  Problem Relation Age of Onset  . Heart disease Brother   . Depression Mother   . Heart attack Mother   . Stroke Mother   . Alcohol abuse Father   . Stroke Father   . Diabetes Sister   . Diabetes Sister   . Stomach cancer Sister   . Kidney disease Sister   . COPD Brother   . Lung cancer Brother   . Diabetes Brother     Social History   Social History  . Marital status: Single    Spouse name: N/A  . Number of children: N/A  . Years of education: N/A   Occupational History  . Not on file.   Social History Main Topics  . Smoking status: Never Smoker  . Smokeless tobacco: Never Used  . Alcohol use No  . Drug use: No  . Sexual activity: No   Other Topics Concern  . Not on file   Social History Narrative  . No narrative on file     Current Outpatient Prescriptions:  .  aspirin EC 81 MG tablet, Take by mouth., Disp: , Rfl:  .  atenolol (TENORMIN) 100 MG tablet, Take 1 tablet (100 mg total) by mouth daily., Disp: 90 tablet, Rfl: 0 .  atenolol (TENORMIN) 100 MG tablet, TAKE 1 TABLET BY MOUTH  DAILY, Disp: 90 tablet, Rfl: 0 .  atorvastatin (LIPITOR) 40 MG tablet, Take 1 tablet (  40 mg total) by mouth at bedtime., Disp: 90 tablet, Rfl: 1 .  haloperidol (HALDOL) 2 MG tablet, Take 3 tablets (6 mg total) by mouth at bedtime., Disp: 90 tablet, Rfl: 3 .  ibuprofen (ADVIL,MOTRIN) 600 MG tablet, Take 600-1,200 mg by mouth every 6 (six) hours as needed., Disp: , Rfl:  .  lisinopril-hydrochlorothiazide (PRINZIDE,ZESTORETIC) 20-25 MG tablet, TAKE 1 TABLET BY MOUTH  DAILY, Disp: 90 tablet, Rfl: 0 .  Multiple Vitamins-Minerals (MULTIVITAMIN WOMEN 50+) TABS, Take by mouth., Disp: , Rfl:  .  omeprazole (PRILOSEC) 20 MG capsule, TAKE 1 CAPSULE BY MOUTH  DAILY, Disp: 90 capsule, Rfl: 0 .  sertraline (ZOLOFT) 50  MG tablet, Take 1 tablet (50 mg total) by mouth daily., Disp: 90 tablet, Rfl: 1  Allergies  Allergen Reactions  . Oxycodone-Acetaminophen Nausea And Vomiting, Diarrhea and Nausea Only  . Percocet [Oxycodone-Acetaminophen] Nausea And Vomiting, Diarrhea and Nausea Only  . Tramadol Hcl Diarrhea, Nausea Only and Nausea And Vomiting  . Tramadol Hcl Diarrhea, Nausea Only and Nausea And Vomiting  . Vicodin [Hydrocodone-Acetaminophen] Nausea And Vomiting, Diarrhea and Nausea Only     Review of Systems  Constitutional: Positive for malaise/fatigue. Negative for fatigue.  Eyes: Negative for blurred vision.  Respiratory: Negative for shortness of breath.   Cardiovascular: Negative for chest pain (occasional chest pain) and palpitations (occasional palpitations.).  Musculoskeletal: Negative for myalgias.  Neurological: Negative for headaches.  Endo/Heme/Allergies: Negative for polydipsia.  Psychiatric/Behavioral: The patient is not nervous/anxious.      Objective  Vitals:   12/20/16 1100  BP: (!) 147/71  Pulse: 86  Resp: 17  Temp: 98 F (36.7 C)  TempSrc: Oral  SpO2: 93%  Weight: 276 lb 8 oz (125.4 kg)  Height: 5\' 2"  (1.575 m)    Physical Exam  Constitutional: She is oriented to person, place, and time and well-developed, well-nourished, and in no distress.  HENT:  Head: Normocephalic and atraumatic.  Cardiovascular: Normal rate, regular rhythm and normal heart sounds.   No murmur heard. Pulmonary/Chest: Effort normal and breath sounds normal. No respiratory distress.  Abdominal: Soft. Bowel sounds are normal. There is no tenderness.  Neurological: She is alert and oriented to person, place, and time.  Psychiatric: Mood, memory and judgment normal. She has a flat affect.  Nursing note and vitals reviewed.      Recent Results (from the past 2160 hour(s))  POCT Glucose (CBG)     Status: Abnormal   Collection Time: 12/20/16 11:14 AM  Result Value Ref Range   POC Glucose  122 (A) 70 - 99 mg/dl  POCT HgB A1C     Status: Abnormal   Collection Time: 12/20/16 11:16 AM  Result Value Ref Range   Hemoglobin A1C 7.3      Assessment & Plan  1. Controlled type 2 diabetes mellitus without complication, without long-term current use of insulin (HCC) Point-of-care A1c 7.3%, start on metformin 500 mg twice a day, prescription sent to pharmacy - POCT HgB A1C - POCT Glucose (CBG) - metFORMIN (GLUCOPHAGE) 500 MG tablet; Take 1 tablet (500 mg total) by mouth 2 (two) times daily with a meal.  Dispense: 180 tablet; Refill: 3  2. Pure hypercholesterolemia  - Lipid panel - atorvastatin (LIPITOR) 40 MG tablet; Take 1 tablet (40 mg total) by mouth at bedtime.  Dispense: 90 tablet; Refill: 1  3. Essential hypertension Repeat is elevated, likely secondary to anxiety, reassess in 2 weeks - lisinopril-hydrochlorothiazide (PRINZIDE,ZESTORETIC) 20-25 MG tablet; Take 1 tablet by mouth daily.  Dispense: 90 tablet; Refill: 0 - atenolol (TENORMIN) 100 MG tablet; Take 1 tablet (100 mg total) by mouth daily.  Dispense: 90 tablet; Refill: 0   Xiao Graul [facility] 12/20/2016 11:16 AM

## 2017-01-06 ENCOUNTER — Telehealth: Payer: Self-pay | Admitting: Family Medicine

## 2017-01-06 DIAGNOSIS — M1712 Unilateral primary osteoarthritis, left knee: Secondary | ICD-10-CM | POA: Diagnosis not present

## 2017-01-07 ENCOUNTER — Encounter: Payer: Self-pay | Admitting: Family Medicine

## 2017-01-07 ENCOUNTER — Ambulatory Visit (INDEPENDENT_AMBULATORY_CARE_PROVIDER_SITE_OTHER): Payer: Medicare Other | Admitting: Family Medicine

## 2017-01-07 VITALS — BP 122/75 | HR 74 | Temp 97.6°F | Resp 16 | Ht 62.0 in | Wt 272.0 lb

## 2017-01-07 DIAGNOSIS — K219 Gastro-esophageal reflux disease without esophagitis: Secondary | ICD-10-CM | POA: Diagnosis not present

## 2017-01-07 DIAGNOSIS — E7800 Pure hypercholesterolemia, unspecified: Secondary | ICD-10-CM

## 2017-01-07 MED ORDER — ROSUVASTATIN CALCIUM 40 MG PO TABS: 40.0000 mg | ORAL_TABLET | Freq: Every day | ORAL | 0 refills | Status: DC

## 2017-01-07 MED ORDER — OMEPRAZOLE 20 MG PO CPDR: 20.0000 mg | DELAYED_RELEASE_CAPSULE | Freq: Every day | ORAL | 0 refills | Status: DC

## 2017-01-07 NOTE — Progress Notes (Signed)
Name: Lauren [name]   MRN: [id number]    DOB: 03/20/1951   Date:01/07/2017       Progress Note  Subjective  Chief Complaint  Chief Complaint  Patient presents with  . Follow-up    changing to a higher statin therapy (Per previous lab)    Hyperlipidemia  This is a chronic problem. The problem is uncontrolled. Recent lipid tests were reviewed and are high. Exacerbating diseases include diabetes and obesity. Pertinent negatives include no leg pain, myalgias or shortness of breath. Current antihyperlipidemic treatment includes statins. The current treatment provides moderate improvement of lipids. Risk factors for coronary artery disease include diabetes mellitus, dyslipidemia, obesity and stress.     Past Medical History:  Diagnosis Date  . Anxiety   . Diabetes mellitus   . Fibromyalgia   . Heart murmur   . Herniated disc   . Hyperlipidemia   . Hypertension   . Lung tumor   . Rheumatoid arthritis ([place])   . Schizoaffective disorder [facility])     Past Surgical History:  Procedure Laterality Date  . BRAIN TUMOR EXCISION    . CARDIAC CATHETERIZATION    . CESAREAN SECTION    . CYST REMOVAL HAND    . LUNG LOBECTOMY      Family History  Problem Relation Age of Onset  . Heart disease Brother   . Depression Mother   . Heart attack Mother   . Stroke Mother   . Alcohol abuse Father   . Stroke Father   . Diabetes Sister   . Diabetes Sister   . Stomach cancer Sister   . Kidney disease Sister   . COPD Brother   . Lung cancer Brother   . Diabetes Brother     Social History   Social History  . Marital status: Single    Spouse name: N/A  . Number of children: N/A  . Years of education: N/A   Occupational History  . Not on file.   Social History Main Topics  . Smoking status: Never Smoker  . Smokeless tobacco: Never Used  . Alcohol use No  . Drug use: No  . Sexual activity: No   Other Topics Concern  . Not on file   Social History Narrative  . No narrative on  file     Current Outpatient Prescriptions:  .  aspirin EC 81 MG tablet, Take by mouth., Disp: , Rfl:  .  atenolol (TENORMIN) 100 MG tablet, Take 1 tablet (100 mg total) by mouth daily., Disp: 90 tablet, Rfl: 0 .  atorvastatin (LIPITOR) 40 MG tablet, Take 1 tablet (40 mg total) by mouth at bedtime., Disp: 90 tablet, Rfl: 1 .  haloperidol (HALDOL) 2 MG tablet, Take 3 tablets (6 mg total) by mouth at bedtime., Disp: 90 tablet, Rfl: 3 .  ibuprofen (ADVIL,MOTRIN) 600 MG tablet, Take 600-1,200 mg by mouth every 6 (six) hours as needed., Disp: , Rfl:  .  lisinopril-hydrochlorothiazide (PRINZIDE,ZESTORETIC) 20-25 MG tablet, Take 1 tablet by mouth daily., Disp: 90 tablet, Rfl: 0 .  metFORMIN (GLUCOPHAGE) 500 MG tablet, Take 1 tablet (500 mg total) by mouth 2 (two) times daily with a meal., Disp: 60 tablet, Rfl: 0 .  Multiple Vitamins-Minerals (MULTIVITAMIN WOMEN 50+) TABS, Take by mouth., Disp: , Rfl:  .  omeprazole (PRILOSEC) 20 MG capsule, TAKE 1 CAPSULE BY MOUTH  DAILY, Disp: 90 capsule, Rfl: 0 .  sertraline (ZOLOFT) 50 MG tablet, Take 1 tablet (50 mg total) by mouth daily., Disp: 90  tablet, Rfl: 1  Allergies  Allergen Reactions  . Oxycodone-Acetaminophen Nausea And Vomiting, Diarrhea and Nausea Only  . Percocet [Oxycodone-Acetaminophen] Nausea And Vomiting, Diarrhea and Nausea Only  . Tramadol Hcl Diarrhea, Nausea Only and Nausea And Vomiting  . Tramadol Hcl Diarrhea, Nausea Only and Nausea And Vomiting  . Vicodin [Hydrocodone-Acetaminophen] Nausea And Vomiting, Diarrhea and Nausea Only     Review of Systems  Respiratory: Negative for shortness of breath.   Musculoskeletal: Negative for myalgias.     Objective  Vitals:   01/07/17 1003  BP: 122/75  Pulse: 74  Resp: 16  Temp: 97.6 F (36.4 C)  TempSrc: Oral  SpO2: 96%  Weight: 272 lb (123.4 kg)  Height: 5\' 2"  (1.575 m)    Physical Exam  Constitutional: She is oriented to person, place, and time and well-developed,  well-nourished, and in no distress.  HENT:  Head: Normocephalic and atraumatic.  Cardiovascular: Normal rate, regular rhythm and normal heart sounds.   No murmur heard. Pulmonary/Chest: Effort normal and breath sounds normal. She has no wheezes.  Abdominal: Soft. Bowel sounds are normal. There is no tenderness.  Neurological: She is alert and oriented to person, place, and time.  Psychiatric: Mood, memory, affect and judgment normal.  Nursing note and vitals reviewed.    Assessment & Plan  1. Pure hypercholesterolemia Changed to a higher intensity statin Crestor, continue with dietary and lifestyle changes - rosuvastatin (CRESTOR) 40 MG tablet; Take 1 tablet (40 mg total) by mouth daily.  Dispense: 30 tablet; Refill: 0  2. Gastroesophageal reflux disease, esophagitis presence not specified  - omeprazole (PRILOSEC) 20 MG capsule; Take 1 capsule (20 mg total) by mouth daily.  Dispense: 90 capsule; Refill: 0   [name] [facility] 01/07/2017 10:19 AM

## 2017-01-22 ENCOUNTER — Ambulatory Visit
Admission: RE | Admit: 2017-01-22 | Discharge: 2017-01-22 | Disposition: A | Discharge status: Home / Self Care | Payer: Medicare Other | Source: Ambulatory Visit | Attending: Surgery | Admitting: Surgery

## 2017-01-22 ENCOUNTER — Encounter
Admission: RE | Admit: 2017-01-22 | Discharge: 2017-01-22 | Disposition: A | Discharge status: Home / Self Care | Payer: Medicare Other | Source: Ambulatory Visit | Attending: Surgery | Admitting: Surgery

## 2017-01-22 DIAGNOSIS — Z01818 Encounter for other preprocedural examination: Secondary | ICD-10-CM | POA: Diagnosis not present

## 2017-01-22 DIAGNOSIS — Z86018 Personal history of other benign neoplasm: Secondary | ICD-10-CM | POA: Diagnosis not present

## 2017-01-22 DIAGNOSIS — Z01810 Encounter for preprocedural cardiovascular examination: Secondary | ICD-10-CM | POA: Insufficient documentation

## 2017-01-22 DIAGNOSIS — Z01812 Encounter for preprocedural laboratory examination: Secondary | ICD-10-CM | POA: Diagnosis not present

## 2017-01-22 DIAGNOSIS — M1712 Unilateral primary osteoarthritis, left knee: Secondary | ICD-10-CM | POA: Insufficient documentation

## 2017-01-22 HISTORY — DX: Unspecified urinary incontinence: R32

## 2017-01-22 HISTORY — DX: Gastro-esophageal reflux disease without esophagitis: K21.9

## 2017-01-22 HISTORY — DX: Sleep apnea, unspecified: G47.30

## 2017-01-22 HISTORY — DX: Unspecified convulsions: R56.9

## 2017-01-22 LAB — CBC
HCT: 38.4 % (ref 35.0–47.0)
Hemoglobin: 13 g/dL (ref 12.0–16.0)
MCH: 31.6 pg (ref 26.0–34.0)
MCHC: 33.8 g/dL (ref 32.0–36.0)
MCV: 93.4 fL (ref 80.0–100.0)
Platelets: 264 10*3/uL (ref 150–440)
RBC: 4.12 MIL/uL (ref 3.80–5.20)
RDW: 14.1 % (ref 11.5–14.5)
WBC: 6.8 10*3/uL (ref 3.6–11.0)

## 2017-01-22 LAB — URINALYSIS, COMPLETE (UACMP) WITH MICROSCOPIC
Bacteria, UA: NONE SEEN
Bilirubin Urine: NEGATIVE
Glucose, UA: NEGATIVE mg/dL
Hgb urine dipstick: NEGATIVE
Ketones, ur: NEGATIVE mg/dL
Leukocytes, UA: NEGATIVE
Nitrite: NEGATIVE
Protein, ur: NEGATIVE mg/dL
Specific Gravity, Urine: 1.02 (ref 1.005–1.030)
pH: 5 (ref 5.0–8.0)

## 2017-01-22 LAB — TYPE AND SCREEN
ABO/RH(D): A POS
Antibody Screen: NEGATIVE

## 2017-01-22 LAB — BASIC METABOLIC PANEL
Anion gap: 10 (ref 5–15)
BUN: 22 mg/dL — ABNORMAL HIGH (ref 6–20)
CO2: 27 mmol/L (ref 22–32)
Calcium: 9.7 mg/dL (ref 8.9–10.3)
Chloride: 102 mmol/L (ref 101–111)
Creatinine, Ser: 0.88 mg/dL (ref 0.44–1.00)
GFR calc Af Amer: >60 mL/min (ref >60)
GFR calc non Af Amer: >60 mL/min (ref >60)
Glucose, Bld: 113 mg/dL — ABNORMAL HIGH (ref 65–99)
Potassium: 3.5 mmol/L (ref 3.5–5.1)
Sodium: 139 mmol/L (ref 135–145)

## 2017-01-22 LAB — APTT: aPTT: 28 seconds (ref 24–36)

## 2017-01-22 LAB — SURGICAL PCR SCREEN
MRSA, PCR: NEGATIVE
Staphylococcus aureus: NEGATIVE

## 2017-01-22 LAB — PROTIME-INR
INR: 0.94
Prothrombin Time: 12.6 seconds (ref 11.4–15.2)

## 2017-01-22 NOTE — Patient Instructions (Addendum)
Your procedure is scheduled on: 01/28/17 Tues Report to Same Day Surgery 2nd floor medical mall [facility] Entrance-take elevator on left to 2nd floor.  Check in with surgery information desk.) To find out your arrival time please call [phone] between 1PM - 3PM on 01/27/17 Mon  Remember: Instructions that are not followed completely may result in serious medical risk, up to and including death, or upon the discretion of your surgeon and anesthesiologist your surgery may need to be rescheduled.    _x___ 1. Do not eat food or drink liquids after midnight. No gum chewing or                              hard candies.     __x__ 2. No Alcohol for 24 hours before or after surgery.   __x__3. No Smoking for 24 prior to surgery.   ____  4. Bring all medications with you on the day of surgery if instructed.    __x__ 5. Notify your doctor if there is any change in your medical condition     (cold, fever, infections).     Do not wear jewelry, make-up, hairpins, clips or nail polish.  Do not wear lotions, powders, or perfumes. You may wear deodorant.  Do not shave 48 hours prior to surgery. Men may shave face and neck.  Do not bring valuables to the hospital.    [facility] is not responsible for any belongings or valuables.               Contacts, dentures or bridgework may not be worn into surgery.  Leave your suitcase in the car. After surgery it may be brought to your room.  For patients admitted to the hospital, discharge time is determined by your                       treatment team.   Patients discharged the day of surgery will not be allowed to drive home.  You will need someone to drive you home and stay with you the night of your procedure.    Please read over the following fact sheets that you were given:   [facility] Preparing for Surgery and or MRSA Information   _x___ Take anti-hypertensive (unless it includes a diuretic), cardiac, seizure, asthma,     anti-reflux and  psychiatric medicines. These include:  1. atenolol (TENORMIN  2.omeprazole (PRILOSEC  3.sertraline (ZOLOFT  4.  5.  6.  ____Fleets enema or Magnesium Citrate as directed.   _x___ Use CHG Soap or sage wipes as directed on instruction sheet   ____ Use inhalers on the day of surgery and bring to hospital day of surgery  _x___ Stop Metformin and Janumet 2 days prior to surgery.    ____ Take 1/2 of usual insulin dose the night before surgery and none on the morning     surgery.   _x___ Follow recommendations from Cardiologist, Pulmonologist or PCP regarding          stopping Aspirin, Coumadin, Pllavix ,Eliquis, Effient, or Pradaxa, and Pletal. Stop Aspirin today if OK with physician.  X____Stop Anti-inflammatories such as Advil, Aleve, Ibuprofen, Motrin, Naproxen, Naprosyn, Goodies powders or aspirin products. OK to take Tylenol and                          Celebrex.   _x___ Stop supplements until  after surgery.  But may continue Vitamin D, Vitamin B,       and multivitamin.   __x__ Bring C-Pap to the hospital.

## 2017-01-26 ENCOUNTER — Other Ambulatory Visit: Payer: Self-pay | Admitting: Family Medicine

## 2017-01-26 DIAGNOSIS — E119 Type 2 diabetes mellitus without complications: Secondary | ICD-10-CM

## 2017-01-27 MED ORDER — DEXTROSE 5 % IV SOLN
3.0000 g | Freq: Once | INTRAVENOUS | Status: AC
  Administered 2017-01-28: 3 g via INTRAVENOUS
  Filled 2017-01-27: qty 3000

## 2017-01-27 NOTE — Telephone Encounter (Signed)
Medication has been refilled and sent to [facility]

## 2017-01-28 ENCOUNTER — Inpatient Hospital Stay: Payer: Medicare Other | Admitting: Anesthesiology

## 2017-01-28 ENCOUNTER — Encounter
Admission: RE | Disposition: A | Discharge status: Skilled Nursing Facility | Payer: Self-pay | Source: Ambulatory Visit | Attending: Surgery

## 2017-01-28 ENCOUNTER — Inpatient Hospital Stay
Admission: RE | Admit: 2017-01-28 | Discharge: 2017-01-30 | DRG: 470 | Disposition: A | Discharge status: Skilled Nursing Facility | Payer: Medicare Other | Source: Ambulatory Visit | Attending: Surgery | Admitting: Surgery

## 2017-01-28 ENCOUNTER — Encounter: Payer: Self-pay | Admitting: *Deleted

## 2017-01-28 ENCOUNTER — Inpatient Hospital Stay: Payer: Medicare Other

## 2017-01-28 DIAGNOSIS — G4730 Sleep apnea, unspecified: Secondary | ICD-10-CM | POA: Diagnosis present

## 2017-01-28 DIAGNOSIS — M02061 Arthropathy following intestinal bypass, right knee: Secondary | ICD-10-CM | POA: Diagnosis not present

## 2017-01-28 DIAGNOSIS — F419 Anxiety disorder, unspecified: Secondary | ICD-10-CM | POA: Diagnosis present

## 2017-01-28 DIAGNOSIS — Z885 Allergy status to narcotic agent status: Secondary | ICD-10-CM | POA: Diagnosis not present

## 2017-01-28 DIAGNOSIS — K219 Gastro-esophageal reflux disease without esophagitis: Secondary | ICD-10-CM | POA: Diagnosis present

## 2017-01-28 DIAGNOSIS — E785 Hyperlipidemia, unspecified: Secondary | ICD-10-CM | POA: Diagnosis present

## 2017-01-28 DIAGNOSIS — E119 Type 2 diabetes mellitus without complications: Secondary | ICD-10-CM | POA: Diagnosis present

## 2017-01-28 DIAGNOSIS — E876 Hypokalemia: Secondary | ICD-10-CM | POA: Diagnosis present

## 2017-01-28 DIAGNOSIS — Z6841 Body Mass Index (BMI) 40.0 and over, adult: Secondary | ICD-10-CM | POA: Diagnosis not present

## 2017-01-28 DIAGNOSIS — Z471 Aftercare following joint replacement surgery: Secondary | ICD-10-CM | POA: Diagnosis not present

## 2017-01-28 DIAGNOSIS — E569 Vitamin deficiency, unspecified: Secondary | ICD-10-CM | POA: Diagnosis not present

## 2017-01-28 DIAGNOSIS — Z7984 Long term (current) use of oral hypoglycemic drugs: Secondary | ICD-10-CM

## 2017-01-28 DIAGNOSIS — E669 Obesity, unspecified: Secondary | ICD-10-CM | POA: Diagnosis present

## 2017-01-28 DIAGNOSIS — M25562 Pain in left knee: Secondary | ICD-10-CM | POA: Diagnosis not present

## 2017-01-28 DIAGNOSIS — Z7401 Bed confinement status: Secondary | ICD-10-CM | POA: Diagnosis not present

## 2017-01-28 DIAGNOSIS — M6281 Muscle weakness (generalized): Secondary | ICD-10-CM | POA: Diagnosis not present

## 2017-01-28 DIAGNOSIS — M797 Fibromyalgia: Secondary | ICD-10-CM | POA: Diagnosis present

## 2017-01-28 DIAGNOSIS — I10 Essential (primary) hypertension: Secondary | ICD-10-CM | POA: Diagnosis present

## 2017-01-28 DIAGNOSIS — M1712 Unilateral primary osteoarthritis, left knee: Secondary | ICD-10-CM | POA: Diagnosis not present

## 2017-01-28 DIAGNOSIS — Z96652 Presence of left artificial knee joint: Secondary | ICD-10-CM | POA: Diagnosis not present

## 2017-01-28 DIAGNOSIS — M069 Rheumatoid arthritis, unspecified: Secondary | ICD-10-CM | POA: Diagnosis present

## 2017-01-28 DIAGNOSIS — Z79899 Other long term (current) drug therapy: Secondary | ICD-10-CM

## 2017-01-28 DIAGNOSIS — Z7982 Long term (current) use of aspirin: Secondary | ICD-10-CM

## 2017-01-28 DIAGNOSIS — F259 Schizoaffective disorder, unspecified: Secondary | ICD-10-CM | POA: Diagnosis present

## 2017-01-28 DIAGNOSIS — R2689 Other abnormalities of gait and mobility: Secondary | ICD-10-CM | POA: Diagnosis not present

## 2017-01-28 DIAGNOSIS — G8918 Other acute postprocedural pain: Secondary | ICD-10-CM | POA: Diagnosis not present

## 2017-01-28 DIAGNOSIS — E1165 Type 2 diabetes mellitus with hyperglycemia: Secondary | ICD-10-CM | POA: Diagnosis not present

## 2017-01-28 DIAGNOSIS — F5109 Other insomnia not due to a substance or known physiological condition: Secondary | ICD-10-CM | POA: Diagnosis not present

## 2017-01-28 DIAGNOSIS — E784 Other hyperlipidemia: Secondary | ICD-10-CM | POA: Diagnosis not present

## 2017-01-28 HISTORY — DX: Presence of left artificial knee joint: Z96.652

## 2017-01-28 HISTORY — PX: TOTAL KNEE ARTHROPLASTY: SHX125

## 2017-01-28 LAB — GLUCOSE, CAPILLARY
Glucose-Capillary: 124 mg/dL — ABNORMAL HIGH (ref 65–99)
Glucose-Capillary: 114 mg/dL — ABNORMAL HIGH (ref 65–99)

## 2017-01-28 LAB — ABO/RH: ABO/RH(D): A POS

## 2017-01-28 SURGERY — ARTHROPLASTY, KNEE, TOTAL
Anesthesia: Spinal | Site: Knee | Laterality: Left | Wound class: Clean

## 2017-01-28 MED ORDER — HALOPERIDOL 2 MG PO TABS
6.0000 mg | ORAL_TABLET | Freq: Every day | ORAL | Status: DC
  Administered 2017-01-28 – 2017-01-29 (×2): 6 mg via ORAL
  Filled 2017-01-28 (×3): qty 3

## 2017-01-28 MED ORDER — NEOMYCIN-POLYMYXIN B GU 40-200000 IR SOLN
Status: AC
  Filled 2017-01-28: qty 20

## 2017-01-28 MED ORDER — PROPOFOL 500 MG/50ML IV EMUL
INTRAVENOUS | Status: DC | PRN
  Administered 2017-01-28: 25 ug/kg/min via INTRAVENOUS

## 2017-01-28 MED ORDER — PROPOFOL 10 MG/ML IV BOLUS
INTRAVENOUS | Status: AC
  Filled 2017-01-28: qty 20

## 2017-01-28 MED ORDER — DIPHENHYDRAMINE HCL 12.5 MG/5ML PO ELIX: 12.5000 mg | ORAL_SOLUTION | ORAL | Status: DC | PRN

## 2017-01-28 MED ORDER — ENOXAPARIN SODIUM 40 MG/0.4ML ~~LOC~~ SOLN
40.0000 mg | SUBCUTANEOUS | Status: DC
  Administered 2017-01-29 – 2017-01-30 (×2): 40 mg via SUBCUTANEOUS
  Filled 2017-01-28 (×2): qty 0.4

## 2017-01-28 MED ORDER — SODIUM CHLORIDE 0.9 % IJ SOLN
INTRAMUSCULAR | Status: AC
  Filled 2017-01-28: qty 50

## 2017-01-28 MED ORDER — NEOMYCIN-POLYMYXIN B GU 40-200000 IR SOLN
Status: DC | PRN
  Administered 2017-01-28: 14 mL

## 2017-01-28 MED ORDER — BISACODYL 10 MG RE SUPP
10.0000 mg | Freq: Every day | RECTAL | Status: DC | PRN
  Administered 2017-01-30: 10 mg via RECTAL
  Filled 2017-01-28: qty 1

## 2017-01-28 MED ORDER — MIDAZOLAM HCL 2 MG/2ML IJ SOLN
INTRAMUSCULAR | Status: AC
  Filled 2017-01-28: qty 2

## 2017-01-28 MED ORDER — MEPERIDINE HCL 50 MG/ML IJ SOLN: 6.2500 mg | INTRAMUSCULAR | Status: DC | PRN

## 2017-01-28 MED ORDER — ONDANSETRON HCL 4 MG PO TABS: 4.0000 mg | ORAL_TABLET | Freq: Four times a day (QID) | ORAL | Status: DC | PRN

## 2017-01-28 MED ORDER — HAIR/SKIN/NAILS PO TABS: ORAL_TABLET | Freq: Every day | ORAL | Status: DC

## 2017-01-28 MED ORDER — BUPIVACAINE LIPOSOME 1.3 % IJ SUSP
INTRAMUSCULAR | Status: AC
  Filled 2017-01-28: qty 20

## 2017-01-28 MED ORDER — MAGNESIUM HYDROXIDE 400 MG/5ML PO SUSP
30.0000 mL | Freq: Every day | ORAL | Status: DC | PRN
  Administered 2017-01-29 – 2017-01-30 (×2): 30 mL via ORAL
  Filled 2017-01-28 (×2): qty 30

## 2017-01-28 MED ORDER — HYDROMORPHONE HCL 2 MG PO TABS
2.0000 mg | ORAL_TABLET | ORAL | Status: DC | PRN
  Administered 2017-01-29 – 2017-01-30 (×5): 2 mg via ORAL
  Administered 2017-01-30: 4 mg via ORAL
  Filled 2017-01-28 (×2): qty 1
  Filled 2017-01-28: qty 2
  Filled 2017-01-28: qty 1
  Filled 2017-01-28: qty 2
  Filled 2017-01-28 (×3): qty 1

## 2017-01-28 MED ORDER — KETOROLAC TROMETHAMINE 15 MG/ML IJ SOLN
30.0000 mg | Freq: Once | INTRAMUSCULAR | Status: AC
  Administered 2017-01-28: 30 mg via INTRAVENOUS
  Filled 2017-01-28: qty 2

## 2017-01-28 MED ORDER — ACETAMINOPHEN 325 MG PO TABS
650.0000 mg | ORAL_TABLET | Freq: Four times a day (QID) | ORAL | Status: DC | PRN
  Administered 2017-01-29: 650 mg via ORAL
  Filled 2017-01-28: qty 2

## 2017-01-28 MED ORDER — ACETAMINOPHEN 500 MG PO TABS
1000.0000 mg | ORAL_TABLET | Freq: Four times a day (QID) | ORAL | Status: AC
  Administered 2017-01-28 – 2017-01-29 (×4): 1000 mg via ORAL
  Filled 2017-01-28 (×4): qty 2

## 2017-01-28 MED ORDER — FENTANYL CITRATE (PF) 100 MCG/2ML IJ SOLN
INTRAMUSCULAR | Status: AC
  Filled 2017-01-28: qty 2

## 2017-01-28 MED ORDER — SODIUM CHLORIDE 0.9 % IV SOLN
INTRAVENOUS | Status: DC | PRN
  Administered 2017-01-28: 60 mL

## 2017-01-28 MED ORDER — BUPIVACAINE HCL (PF) 0.5 % IJ SOLN
INTRAMUSCULAR | Status: DC | PRN
  Administered 2017-01-28: 3 mL

## 2017-01-28 MED ORDER — SUCCINYLCHOLINE CHLORIDE 20 MG/ML IJ SOLN
INTRAMUSCULAR | Status: AC
  Filled 2017-01-28: qty 1

## 2017-01-28 MED ORDER — ADULT MULTIVITAMIN W/MINERALS CH
1.0000 | ORAL_TABLET | Freq: Every day | ORAL | Status: DC
  Administered 2017-01-29 – 2017-01-30 (×2): 1 via ORAL
  Filled 2017-01-28 (×2): qty 1

## 2017-01-28 MED ORDER — METOCLOPRAMIDE HCL 5 MG/ML IJ SOLN: 5.0000 mg | Freq: Three times a day (TID) | INTRAMUSCULAR | Status: DC | PRN

## 2017-01-28 MED ORDER — LISINOPRIL 20 MG PO TABS
20.0000 mg | ORAL_TABLET | Freq: Every day | ORAL | Status: DC
  Administered 2017-01-29 – 2017-01-30 (×2): 20 mg via ORAL
  Filled 2017-01-28: qty 1

## 2017-01-28 MED ORDER — HYDROCHLOROTHIAZIDE 25 MG PO TABS
25.0000 mg | ORAL_TABLET | Freq: Every day | ORAL | Status: DC
  Administered 2017-01-29 – 2017-01-30 (×2): 25 mg via ORAL
  Filled 2017-01-28 (×2): qty 1

## 2017-01-28 MED ORDER — TRANEXAMIC ACID 1000 MG/10ML IV SOLN
INTRAVENOUS | Status: AC
Start: 2017-01-28 — End: 2017-01-28
  Filled 2017-01-28: qty 10

## 2017-01-28 MED ORDER — DEXTROSE 5 % IV SOLN
3.0000 g | Freq: Four times a day (QID) | INTRAVENOUS | Status: AC
  Administered 2017-01-28 – 2017-01-29 (×3): 3 g via INTRAVENOUS
  Filled 2017-01-28 (×3): qty 3

## 2017-01-28 MED ORDER — METOCLOPRAMIDE HCL 10 MG PO TABS: 5.0000 mg | ORAL_TABLET | Freq: Three times a day (TID) | ORAL | Status: DC | PRN

## 2017-01-28 MED ORDER — DOCUSATE SODIUM 100 MG PO CAPS
100.0000 mg | ORAL_CAPSULE | Freq: Two times a day (BID) | ORAL | Status: DC
  Administered 2017-01-28 – 2017-01-30 (×4): 100 mg via ORAL
  Filled 2017-01-28 (×4): qty 1

## 2017-01-28 MED ORDER — MIDAZOLAM HCL 5 MG/5ML IJ SOLN
INTRAMUSCULAR | Status: DC | PRN
  Administered 2017-01-28: 2 mg via INTRAVENOUS

## 2017-01-28 MED ORDER — HYDROMORPHONE HCL 1 MG/ML IJ SOLN
1.0000 mg | INTRAMUSCULAR | Status: DC | PRN
  Administered 2017-01-28 – 2017-01-30 (×3): 1 mg via INTRAVENOUS
  Filled 2017-01-28: qty 2
  Filled 2017-01-28 (×2): qty 1

## 2017-01-28 MED ORDER — SODIUM CHLORIDE 0.9 % IV SOLN
INTRAVENOUS | Status: DC
  Administered 2017-01-28: 10:00:00 via INTRAVENOUS
  Administered 2017-01-28: 20 mL/h via INTRAVENOUS
  Administered 2017-01-28: 07:00:00 via INTRAVENOUS

## 2017-01-28 MED ORDER — ATENOLOL 50 MG PO TABS
100.0000 mg | ORAL_TABLET | Freq: Every day | ORAL | Status: DC
Start: 2017-01-29 — End: 2017-01-30
  Administered 2017-01-29 – 2017-01-30 (×2): 100 mg via ORAL
  Filled 2017-01-28 (×3): qty 2

## 2017-01-28 MED ORDER — ONDANSETRON HCL 4 MG/2ML IJ SOLN
4.0000 mg | Freq: Four times a day (QID) | INTRAMUSCULAR | Status: DC | PRN
  Administered 2017-01-28 – 2017-01-30 (×2): 4 mg via INTRAVENOUS
  Filled 2017-01-28 (×2): qty 2

## 2017-01-28 MED ORDER — PANTOPRAZOLE SODIUM 40 MG PO TBEC
40.0000 mg | DELAYED_RELEASE_TABLET | Freq: Two times a day (BID) | ORAL | Status: DC
  Administered 2017-01-28 – 2017-01-30 (×4): 40 mg via ORAL
  Filled 2017-01-28 (×4): qty 1

## 2017-01-28 MED ORDER — PROMETHAZINE HCL 25 MG/ML IJ SOLN: 6.2500 mg | INTRAMUSCULAR | Status: DC | PRN

## 2017-01-28 MED ORDER — SERTRALINE HCL 50 MG PO TABS
50.0000 mg | ORAL_TABLET | Freq: Every day | ORAL | Status: DC
  Administered 2017-01-29 – 2017-01-30 (×2): 50 mg via ORAL
  Filled 2017-01-28 (×2): qty 1

## 2017-01-28 MED ORDER — LISINOPRIL-HYDROCHLOROTHIAZIDE 20-25 MG PO TABS: 1.0000 | ORAL_TABLET | Freq: Every day | ORAL | Status: DC

## 2017-01-28 MED ORDER — BUPIVACAINE HCL (PF) 0.5 % IJ SOLN
INTRAMUSCULAR | Status: AC
  Filled 2017-01-28: qty 30

## 2017-01-28 MED ORDER — METFORMIN HCL 500 MG PO TABS
500.0000 mg | ORAL_TABLET | Freq: Two times a day (BID) | ORAL | Status: DC
  Administered 2017-01-28 – 2017-01-30 (×4): 500 mg via ORAL
  Filled 2017-01-28 (×4): qty 1

## 2017-01-28 MED ORDER — TRANEXAMIC ACID 1000 MG/10ML IV SOLN
INTRAVENOUS | Status: DC | PRN
  Administered 2017-01-28: 1000 mg via INTRAVENOUS

## 2017-01-28 MED ORDER — ROSUVASTATIN CALCIUM 20 MG PO TABS
40.0000 mg | ORAL_TABLET | Freq: Every day | ORAL | Status: DC
  Administered 2017-01-28 – 2017-01-29 (×2): 40 mg via ORAL
  Filled 2017-01-28 (×2): qty 2

## 2017-01-28 MED ORDER — FENTANYL CITRATE (PF) 100 MCG/2ML IJ SOLN: 25.0000 ug | INTRAMUSCULAR | Status: DC | PRN

## 2017-01-28 MED ORDER — FLEET ENEMA 7-19 GM/118ML RE ENEM: 1.0000 | ENEMA | Freq: Once | RECTAL | Status: DC | PRN

## 2017-01-28 MED ORDER — VITAMIN C 500 MG PO TABS
250.0000 mg | ORAL_TABLET | Freq: Every day | ORAL | Status: DC | PRN
  Filled 2017-01-28: qty 1

## 2017-01-28 MED ORDER — BUPIVACAINE-EPINEPHRINE (PF) 0.5% -1:200000 IJ SOLN
INTRAMUSCULAR | Status: DC | PRN
  Administered 2017-01-28: 30 mL via PERINEURAL

## 2017-01-28 MED ORDER — PHENYLEPHRINE HCL 10 MG/ML IJ SOLN
INTRAMUSCULAR | Status: DC | PRN
  Administered 2017-01-28: 100 ug via INTRAVENOUS

## 2017-01-28 MED ORDER — ACETAMINOPHEN 650 MG RE SUPP: 650.0000 mg | Freq: Four times a day (QID) | RECTAL | Status: DC | PRN

## 2017-01-28 MED ORDER — KETOROLAC TROMETHAMINE 15 MG/ML IJ SOLN
15.0000 mg | Freq: Four times a day (QID) | INTRAMUSCULAR | Status: AC
  Administered 2017-01-28 – 2017-01-29 (×4): 15 mg via INTRAVENOUS
  Filled 2017-01-28 (×4): qty 1

## 2017-01-28 MED ORDER — POTASSIUM CHLORIDE IN NACL 20-0.9 MEQ/L-% IV SOLN
INTRAVENOUS | Status: DC
  Administered 2017-01-28 – 2017-01-30 (×3): via INTRAVENOUS
  Filled 2017-01-28 (×7): qty 1000

## 2017-01-28 MED ORDER — BUPIVACAINE-EPINEPHRINE (PF) 0.5% -1:200000 IJ SOLN
INTRAMUSCULAR | Status: AC
  Filled 2017-01-28: qty 30

## 2017-01-28 MED ORDER — ASPIRIN EC 81 MG PO TBEC
81.0000 mg | DELAYED_RELEASE_TABLET | Freq: Every day | ORAL | Status: DC
  Administered 2017-01-29 – 2017-01-30 (×2): 81 mg via ORAL
  Filled 2017-01-28 (×2): qty 1

## 2017-01-28 SURGICAL SUPPLY — 58 items
BANDAGE ELASTIC 6 CLIP ST LF (GAUZE/BANDAGES/DRESSINGS) ×2 IMPLANT
BLADE SAW SAG 25X90X1.19 (BLADE) ×2 IMPLANT
BLADE SURG SZ20 CARB STEEL (BLADE) ×2 IMPLANT
BONE CEMENT PALACOSE (Cement) ×4 IMPLANT
CANISTER SUCT 1200ML W/VALVE (MISCELLANEOUS) ×2 IMPLANT
CANISTER SUCT 3000ML PPV (MISCELLANEOUS) ×2 IMPLANT
CAPT KNEE TOTAL 3 ×2 IMPLANT
CATH TRAY METER 16FR LF (MISCELLANEOUS) ×2 IMPLANT
CEMENT BONE PALACOSE (Cement) ×2 IMPLANT
CHLORAPREP W/TINT 26ML (MISCELLANEOUS) ×2 IMPLANT
COOLER POLAR GLACIER W/PUMP (MISCELLANEOUS) ×2 IMPLANT
COVER MAYO STAND STRL (DRAPES) ×2 IMPLANT
CUFF TOURN 24 STER (MISCELLANEOUS) IMPLANT
CUFF TOURN 30 STER DUAL PORT (MISCELLANEOUS) ×2 IMPLANT
DRAPE IMP U-DRAPE 54X76 (DRAPES) ×2 IMPLANT
DRAPE INCISE IOBAN 66X45 STRL (DRAPES) ×2 IMPLANT
DRAPE SHEET LG 3/4 BI-LAMINATE (DRAPES) ×2 IMPLANT
DRSG OPSITE POSTOP 4X10 (GAUZE/BANDAGES/DRESSINGS) ×2 IMPLANT
DRSG OPSITE POSTOP 4X12 (GAUZE/BANDAGES/DRESSINGS) IMPLANT
DRSG OPSITE POSTOP 4X14 (GAUZE/BANDAGES/DRESSINGS) IMPLANT
ELECT CAUTERY BLADE 6.4 (BLADE) ×2 IMPLANT
ELECT REM PT RETURN 9FT ADLT (ELECTROSURGICAL) ×2
ELECTRODE REM PT RTRN 9FT ADLT (ELECTROSURGICAL) ×1 IMPLANT
GLOVE BIO SURGEON STRL SZ7.5 (GLOVE) ×8 IMPLANT
GLOVE BIO SURGEON STRL SZ8 (GLOVE) ×8 IMPLANT
GLOVE BIOGEL PI IND STRL 8 (GLOVE) ×1 IMPLANT
GLOVE BIOGEL PI INDICATOR 8 (GLOVE) ×1
GLOVE INDICATOR 8.0 STRL GRN (GLOVE) ×2 IMPLANT
GOWN STRL REUS W/ TWL LRG LVL3 (GOWN DISPOSABLE) ×1 IMPLANT
GOWN STRL REUS W/ TWL XL LVL3 (GOWN DISPOSABLE) ×1 IMPLANT
GOWN STRL REUS W/TWL LRG LVL3 (GOWN DISPOSABLE) ×1
GOWN STRL REUS W/TWL XL LVL3 (GOWN DISPOSABLE) ×1
HOLDER FOLEY CATH W/STRAP (MISCELLANEOUS) ×2 IMPLANT
HOOD PEEL AWAY FLYTE STAYCOOL (MISCELLANEOUS) ×6 IMPLANT
IMMBOLIZER KNEE 19 BLUE UNIV (SOFTGOODS) ×2 IMPLANT
KIT RM TURNOVER STRD PROC AR (KITS) ×2 IMPLANT
NDL SAFETY 18GX1.5 (NEEDLE) ×2 IMPLANT
NEEDLE 18GX1X1/2 (RX/OR ONLY) (NEEDLE) ×2 IMPLANT
NEEDLE SPNL 20GX3.5 QUINCKE YW (NEEDLE) ×2 IMPLANT
NS IRRIG 1000ML POUR BTL (IV SOLUTION) ×2 IMPLANT
PACK TOTAL KNEE (MISCELLANEOUS) ×2 IMPLANT
PAD WRAPON POLAR KNEE (MISCELLANEOUS) ×1 IMPLANT
PIN STEINMANN THREADED TIP (PIN) ×2 IMPLANT
PULSAVAC PLUS IRRIG FAN TIP (DISPOSABLE) ×2
SOL .9 NS 3000ML IRR  AL (IV SOLUTION) ×1
SOL .9 NS 3000ML IRR UROMATIC (IV SOLUTION) ×1 IMPLANT
STAPLER SKIN PROX 35W (STAPLE) ×2 IMPLANT
SUCTION FRAZIER HANDLE 10FR (MISCELLANEOUS) ×1
SUCTION TUBE FRAZIER 10FR DISP (MISCELLANEOUS) ×1 IMPLANT
SUT VIC AB 0 CT1 36 (SUTURE) ×6 IMPLANT
SUT VIC AB 2-0 CT1 27 (SUTURE) ×3
SUT VIC AB 2-0 CT1 TAPERPNT 27 (SUTURE) ×3 IMPLANT
SYR 20CC LL (SYRINGE) ×2 IMPLANT
SYR 30ML LL (SYRINGE) ×6 IMPLANT
SYRINGE 10CC LL (SYRINGE) ×2 IMPLANT
SYSTEM VACUUM CEMENT MIXING (MISCELLANEOUS) ×2 IMPLANT
TIP FAN IRRIG PULSAVAC PLUS (DISPOSABLE) ×1 IMPLANT
WRAPON POLAR PAD KNEE (MISCELLANEOUS) ×2

## 2017-01-28 NOTE — Op Note (Signed)
01/28/2017  9:54 AM  Patient:   Lauren [name]  Pre-Op Diagnosis:   Degenerative joint disease, left knee.  Post-Op Diagnosis:   Same  Procedure:   left TKA using all-cemented [employer] Vanguard system with a 62.5 mm PCR femur and a 67 mm tibial tray with a 10 mm AS E-poly insert.  Surgeon:   Pascal Lux, MD  Assistant:   Cameron Proud, PA-C   Anesthesia:   Spinal  Findings:   As above  Complications:   None  EBL:   10 cc  Fluids:   1000 cc crystalloid  UOP:   100 cc  TT:   97 minutes at 300 mmHg  Drains:   None  Closure:   Staples  Implants:   As above  Brief Clinical Note:   The patient is a 66 year old female with a long history of progressively worsening left knee pain. The patient's symptoms have progressed despite medications, activity modification, injections, etc. The patient's history and examination were consistent with advanced degenerative joint disease of the right knee confirmed by plain radiographs. The patient presents at this time for a left total knee arthroplasty.  Procedure:   The patient was brought into the operating room. After adequate spinal anesthesia was obtained, the patient was lain in the supine position. A Foley catheter was placed by the nurse before the right lower extremity was prepped with ChloraPrep solution and draped sterilely. Preoperative antibiotics were administered. After verifying the proper laterality with a surgical timeout, the limb was exsanguinated with an Esmarch and the tourniquet inflated to 300 mmHg. A standard anterior approach to the knee was made through an approximately 7 inch incision. The incision was carried down through the subcutaneous tissues to expose superficial retinaculum. This was split the length of the incision and the medial flap elevated sufficiently to expose the medial retinaculum. The medial retinaculum was incised, leaving a 3-4 mm cuff of tissue on the patella. This was extended distally along the  medial border of the patellar tendon and proximally through the medial third of the quadriceps tendon. A subtotal fat pad excision was performed before the soft tissues were elevated off the anteromedial and anterolateral aspects of the proximal tibia to the level of the collateral ligaments. The anterior portions of the medial and lateral menisci were removed, as was the anterior cruciate ligament. With the knee flexed to 90, the external tibial guide was positioned and the appropriate proximal tibial cut made. This piece was taken to the back table where it was measured and found to be optimally replicated by a 67 mm component.  Attention was directed to the distal femur. The intramedullary canal was accessed through a 3/8" drill hole. The intramedullary guide was inserted and position in order to obtain a neutral flexion gap. The intercondylar block was positioned with care taken to avoid notching the anterior cortex of the femur. The appropriate cut was made. Next, the distal cutting block was placed at 6 of valgus alignment. Using the 9 mm slot, the distal cut was made. The distal femur was measured and found to be optimally replicated by the 20.3 mm component. The 62.5 mm 4-in-1 cutting block was positioned and first the posterior, then the posterior chamfer, the anterior chamfer, femoral and intercondylar cuts were made. At this point, the posterior portions medial and lateral menisci were removed. A trial reduction was performed using the appropriate femoral and tibial components with the 10 mm insert. This demonstrated excellent stability to varus and  valgus stressing both in flexion and extension while permitting full extension. Patella tracking was assessed and found to be excellent. Therefore, the tibial guide position was marked on the proximal tibia. The patella thickness was measured and found to be only 17 mm. Therefore, it was elected not to perform a patellar resurfacing, especially as there  was reasonable articular cartilage still present on the patella. Patella tracking was assessed and found to be excellent, passing the "no thumb test". The lug holes were drilled into the distal femur before the trial component was removed, leaving only the tibial tray. The keel was then created using the appropriate tower, reamer, and punch.  The bony surfaces were prepared for cementing by irrigating thoroughly with bacitracin saline solution. A bone plug was fashioned from some of the bone that had been removed previously and used to plug the distal femoral canal. In addition, 20 cc of Exparel diluted out to 60 cc with normal saline and 30 cc of 0.5% Sensorcaine were injected into the postero-medial and postero-lateral aspects of the knee, the medial and lateral gutter regions, and the peri-incisional tissues to help with postoperative analgesia. Meanwhile, the cement was being mixed on the back table. When it was ready, the tibial tray was cemented in first. The excess cement was removed using [employer] fast streamer. Next, the femoral component was impacted into place. Again, the excess cement was removed using [employer] fast streamer. The 10 mm trial insert was positioned and the knee brought into extension while the cement hardened. Finally, the patella was cemented into place and secured using the patellar clamp. Again, the excess cement was removed using [employer] fast streamer. Once the cement had hardened, the knee was placed through a range of motion with the findings as described above. There was noted be is a small amount of anterior-posterior displacement, especially at 45 position. Therefore, it was elected to proceed with an anterior stabilized 10 mm insert. The trial insert was removed and, after verifying that no cement had been retained posteriorly, the permanent 10 mm anterior stabilized E1 polyethylene insert was positioned and secured using the appropriate key locking mechanism. Again the knee was placed through a  range of motion with the findings as described above.  The wound was copiously irrigated with bacitracin saline solution using the jet lavage system before the quadriceps tendon and retinacular layer were reapproximated using #0 Vicryl interrupted sutures. The superficial retinacular layer was closed using 2-0 Vicryl interrupted sutures in several layers for the skin was closed using staples. A sterile honeycomb dressing was applied to the skin before the leg was wrapped with an Ace wrap to accommodate the polar pack. The patient was then awakened and returned to the recovery room in satisfactory condition after tolerating the procedure well.

## 2017-01-28 NOTE — Progress Notes (Signed)
PHARMACIST - PHYSICIAN ORDER COMMUNICATION  CONCERNING: P&T Medication Policy on Herbal Medications  DESCRIPTION:  This patient's order for:  Hair/Skin/Nails capsule  has been noted.  This product(s) is classified as an "herbal" or natural product. Due to a lack of definitive safety studies or FDA approval, nonstandard manufacturing practices, plus the potential risk of unknown drug-drug interactions while on inpatient medications, the Pharmacy and Therapeutics Committee does not permit the use of "herbal" or natural products of this type within [facility].   ACTION TAKEN: The pharmacy department is unable to verify this order at this time. Please reevaluate patient's clinical condition at discharge and address if the herbal or natural product(s) should be resumed at that time.  Paulina Fusi, PharmD, BCPS 01/28/2017 11:55 AM

## 2017-01-28 NOTE — Anesthesia Procedure Notes (Signed)
Spinal  Patient location during procedure: OR Start time: 01/28/2017 7:25 AM End time: 01/28/2017 7:32 AM Staffing Anesthesiologist: Emmie Niemann Performed: anesthesiologist  Preanesthetic Checklist Completed: patient identified, site marked, surgical consent, pre-op evaluation, timeout performed, IV checked, risks and benefits discussed and monitors and equipment checked Spinal Block Patient position: sitting Prep: ChloraPrep Patient monitoring: heart rate, continuous pulse ox, blood pressure and cardiac monitor Approach: midline Location: L4-5 Injection technique: single-shot Needle Needle type: Introducer and Pencil-Tip  Needle gauge: 24 G Needle length: 9 cm Additional Notes Negative paresthesia. Negative blood return. Positive free-flowing CSF. Expiration date of kit checked and confirmed. Patient tolerated procedure well, without complications.

## 2017-01-28 NOTE — Anesthesia Preprocedure Evaluation (Signed)
Anesthesia Evaluation  Patient identified by MRN, date of birth, ID band Patient awake    Reviewed: Allergy & Precautions, NPO status , Patient's Chart, lab work & pertinent test results  History of Anesthesia Complications Negative for: history of anesthetic complications  Airway Mallampati: II  TM Distance: >3 FB Neck ROM: Full    Dental  (+) Poor Dentition, Chipped   Pulmonary sleep apnea and Continuous Positive Airway Pressure Ventilation , neg COPD,    breath sounds clear to auscultation- rhonchi (-) wheezing      Cardiovascular hypertension, Pt. on medications (-) CAD, (-) Past MI and (-) Cardiac Stents  Rhythm:Regular Rate:Normal - Systolic murmurs and - Diastolic murmurs    Neuro/Psych PSYCHIATRIC DISORDERS Anxiety Schizophrenia negative neurological ROS     GI/Hepatic Neg liver ROS, GERD  ,  Endo/Other  diabetes, Oral Hypoglycemic Agents  Renal/GU negative Renal ROS     Musculoskeletal  (+) Arthritis , Rheumatoid disorders,  Fibromyalgia -  Abdominal (+) + obese,   Peds  Hematology negative hematology ROS (+)   Anesthesia Other Findings Past Medical History: No date: Anxiety No date: Diabetes mellitus No date: Fibromyalgia No date: GERD (gastroesophageal reflux disease) No date: Heart murmur No date: Herniated disc No date: Hyperlipidemia No date: Hypertension No date: Lack of bladder control No date: Lung tumor No date: Rheumatoid arthritis ([place]) No date: Schizoaffective disorder ([employer]) No date: Seizure [facility])     Comment: after brain surgery 2012. last seizure 2013! No date: Sleep apnea   Reproductive/Obstetrics                             Anesthesia Physical Anesthesia Plan  ASA: III  Anesthesia Plan: Spinal   Post-op Pain Management:    Induction:   Airway Management Planned: Natural Airway  Additional Equipment:   Intra-op Plan:   Post-operative Plan:    Informed Consent: I have reviewed the patients History and Physical, chart, labs and discussed the procedure including the risks, benefits and alternatives for the proposed anesthesia with the patient or authorized representative who has indicated his/her understanding and acceptance.   Dental advisory given  Plan Discussed with: Anesthesiologist and CRNA  Anesthesia Plan Comments:         Lab Results  Component Value Date   WBC 6.8 01/22/2017   HGB 13.0 01/22/2017   HCT 38.4 01/22/2017   MCV 93.4 01/22/2017   PLT 264 01/22/2017    Anesthesia Quick Evaluation

## 2017-01-28 NOTE — Anesthesia Post-op Follow-up Note (Cosign Needed)
Anesthesia QCDR form completed.        

## 2017-01-28 NOTE — Progress Notes (Signed)
Admission: Patient alert and oriented. Able to move both bilateral extremities with full sensation. NO skin issues besides the surgical site dressed with honeycomb dressing and ace bandage. Minimal drainage on dressing. Patient has CPAP from home in room, respiratory team has already checked it. Complaining of minimal pain at this time. Patient oriented to room and call bell system.   Deri Fuelling, RN

## 2017-01-28 NOTE — Transfer of Care (Signed)
Immediate Anesthesia Transfer of Care Note  Patient: Lauren [name]  Procedure(s) Performed: Procedure(s): TOTAL KNEE ARTHROPLASTY (Left)  Patient Location: PACU  Anesthesia Type:Spinal  Level of Consciousness: awake, alert  and oriented  Airway & Oxygen Therapy: Patient Spontanous Breathing and Patient connected to face mask oxygen  Post-op Assessment: Report given to RN and Post -op Vital signs reviewed and stable  Post vital signs: Reviewed and stable  Last Vitals:  Vitals:   01/28/17 0619  BP: (!) 188/86  Pulse: 75  Resp: 15  Temp: 36.4 C    Last Pain:  Vitals:   01/28/17 0619  TempSrc: Tympanic  PainSc: 8       Patients Stated Pain Goal: 1 (32/95/18 8416)  Complications: No apparent anesthesia complications

## 2017-01-28 NOTE — Progress Notes (Signed)
Patient BP 179/82. PA notified. RN will continue to monitor. No new orders at this time.   Deri Fuelling, RN

## 2017-01-28 NOTE — H&P (Signed)
Paper H&P to be scanned into permanent record. H&P reviewed and patient re-examined. No changes. 

## 2017-01-28 NOTE — Evaluation (Signed)
Physical Therapy Evaluation Patient Details Name: Lauren [name] MRN: [id number] DOB: 1951-06-14 Today's Date: 01/28/2017   History of Present Illness  Pt underwent L TKR without reported post-op complications. She is POD#0 at time of evaluation. PMH includes OSA, anxiety, schizoaffective, diabetes, fibromyalgia, and RA.  Clinical Impression  Pt admitted with above diagnosis. Pt currently with functional limitations due to the deficits listed below (see PT Problem List).  Pt requires minA+1 for bed mobility but CGA only for transfers and limited ambulation from bed to recliner. She initially struggles with weight acceptance to LLE but improves with each additional step. Pt should be appropriate to discharge home with [facility] PT if she can complete her PT goals for care. Pt will benefit from skilled PT services to address deficits in strength, balance, and mobility in order to return to full function at home.     Follow Up Recommendations Home health PT    Equipment Recommendations  Rolling walker with 5" wheels;Other (comment);3in1 (PT) (May do better with bariatric equipment)    Recommendations for Other Services OT consult     Precautions / Restrictions Precautions Precautions: Knee Precaution Booklet Issued: Yes (comment) Restrictions Weight Bearing Restrictions: Yes LLE Weight Bearing: Weight bearing as tolerated      Mobility  Bed Mobility Overal bed mobility: Needs Assistance Bed Mobility: Supine to Sit     Supine to sit: Min assist     General bed mobility comments: Pt provided verbal cues for rolling and to push from R sidelying to sitting. Once upright able to scoot to EOB without assist  Transfers Overall transfer level: Needs assistance Equipment used: Rolling walker (2 wheeled) Transfers: Sit to/from Stand Sit to Stand: Min guard         General transfer comment: Pt demonstrates fair weight shift to LLE. Stable in standing with bilateral UE support on  walker  Ambulation/Gait Ambulation/Gait assistance: Min guard Ambulation Distance (Feet): 5 Feet Assistive device: Rolling walker (2 wheeled) Gait Pattern/deviations: Step-to pattern;Decreased step length - right;Decreased stance time - left;Antalgic Gait velocity: Decreaed Gait velocity interpretation: <1.8 ft/sec, indicative of risk for recurrent falls General Gait Details: Pt able to take short, shuffling steps from bed to recliner. Cues for proper sequencing with rolling walker. Improving weight acceptance to LLE with each step. Heavy UE reliance on rolling walker  Stairs            Wheelchair Mobility    Modified Rankin (Stroke Patients Only)       Balance Overall balance assessment: Needs assistance Sitting-balance support: No upper extremity supported Sitting balance-Leahy Scale: Good     Standing balance support: Bilateral upper extremity supported Standing balance-Leahy Scale: Fair Standing balance comment: Pt requires UE support on walker due to difficulty with full weight shift to LLE                             Pertinent Vitals/Pain Pain Assessment: 0-10 Pain Score: 4  Pain Location: L knee Pain Descriptors / Indicators: Operative site guarding Pain Intervention(s): Limited activity within patient's tolerance;Monitored during session;Premedicated before session    Home Living Family/patient expects to be discharged to:: Private residence Living Arrangements: Children Available Help at Discharge: Family Type of Home: House Home Access: Ramped entrance     Home Layout: One level Home Equipment: None      Prior Function Level of Independence: Independent         Comments: Pt reports community ambulation  with assistive device. Independent with ADLs. Family assists with IADLs as needed     Hand Dominance   Dominant Hand: Right    Extremity/Trunk Assessment   Upper Extremity Assessment Upper Extremity Assessment: Overall WFL for  tasks assessed    Lower Extremity Assessment Lower Extremity Assessment: LLE deficits/detail LLE Deficits / Details: RLE grossly WFL. Pt able to perform full SLR with 2 finger assist initially and then independently. Full SAQ without assist. Reports intact sensation in LLE       Communication   Communication: No difficulties  Cognition Arousal/Alertness: Awake/alert Behavior During Therapy: WFL for tasks assessed/performed Overall Cognitive Status: Within Functional Limits for tasks assessed                                 General Comments: AOx4 at time of PT evaluation      General Comments      Exercises Total Joint Exercises Ankle Circles/Pumps: AROM;Both;10 reps;Supine Quad Sets: Strengthening;Both;10 reps;Supine Gluteal Sets: Strengthening;Both;Supine;10 reps Towel Squeeze: Strengthening;Both;10 reps;Supine Short Arc Quad: Strengthening;10 reps;Supine;Left Heel Slides: Strengthening;10 reps;Supine;Left Hip ABduction/ADduction: Strengthening;Left;10 reps;Supine Straight Leg Raises: Strengthening;Left;10 reps;Supine Goniometric ROM: -5 to 90 degrees, AAROM   Assessment/Plan    PT Assessment Patient needs continued PT services  PT Problem List Decreased strength;Decreased range of motion;Decreased balance;Decreased mobility;Decreased knowledge of use of DME;Obesity;Decreased skin integrity;Pain       PT Treatment Interventions DME instruction;Gait training;Stair training;Functional mobility training;Therapeutic activities;Therapeutic exercise;Balance training;Neuromuscular re-education;Patient/family education;Manual techniques    PT Goals (Current goals can be found in the Care Plan section)  Acute Rehab PT Goals Patient Stated Goal: Return to prior function at home PT Goal Formulation: With patient Time For Goal Achievement: 02/11/17 Potential to Achieve Goals: Good    Frequency BID   Barriers to discharge        Co-evaluation                AM-PAC PT "6 Clicks" Daily Activity  Outcome Measure Difficulty turning over in bed (including adjusting bedclothes, sheets and blankets)?: Total Difficulty moving from lying on back to sitting on the side of the bed? : Total Difficulty sitting down on and standing up from a chair with arms (e.g., wheelchair, bedside commode, etc,.)?: Total Help needed moving to and from a bed to chair (including a wheelchair)?: A Little Help needed walking in hospital room?: A Little Help needed climbing 3-5 steps with a railing? : A Lot 6 Click Score: 11    End of Session Equipment Utilized During Treatment: Gait belt Activity Tolerance: Patient tolerated treatment well Patient left: in chair;with call bell/phone within reach;with chair alarm set;with nursing/sitter in room;with SCD's reapplied;Other (comment) (towel roll under heel, polar care in place) Nurse Communication: Mobility status PT Visit Diagnosis: Unsteadiness on feet (R26.81);Muscle weakness (generalized) (M62.81)    Time: 5449-2010 PT Time Calculation (min) (ACUTE ONLY): 32 min   Charges:   PT Evaluation $PT Eval Low Complexity: 1 Procedure PT Treatments $Therapeutic Exercise: 8-22 mins   PT G Codes:        Lyndel Safe Damya Comley PT, DPT    Skippy Marhefka 01/28/2017, 3:59 PM

## 2017-01-28 NOTE — NC FL2 (Signed)
[place] LEVEL OF CARE SCREENING TOOL     IDENTIFICATION  Patient Name: Lauren [name] Birthdate: [birth year] Sex: female Admission Date (Current Location): 01/28/2017  [place] and [place] Number:  Engineering geologist and Address:  [facility], [address] [place], [place] [address]      Provider Number: [id number]  Attending Physician Name and Address:  Corky Mull, MD  Relative Name and Phone Number:       Current Level of Care: Hospital Recommended Level of Care: [place] Prior Approval Number:    Date Approved/Denied:   PASRR Number:  [id number])  Discharge Plan: SNF    Current Diagnoses: Patient Active Problem List   Diagnosis Date Noted  . 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 Diabetes mellitus 05/30/2016  . Hyperlipidemia 02/14/2016  . Gravida 1 10/26/2015  . Dentagra 10/26/2015  . Itch of skin 10/26/2015  . Sex counseling 10/26/2015  . Excessive sweating 07/05/2015  . Acid reflux 06/29/2015  . Encounter for pre-employment examination 06/29/2015  . Diabetes mellitus type 2, controlled, without complications ([place]) 83/15/1761  . Cardiac murmur 06/29/2015  . Arthritis of knee, degenerative 06/28/2015  . Stiffness of both knees 06/22/2015  . Insomnia w/ sleep apnea 03/21/2015  . Anxiety disorder 03/21/2015  . Hypertension 03/21/2015  . HLD (hyperlipidemia) 03/21/2015  . Paranoid schizophrenia ([place]) 03/21/2015  . Urge incontinence 03/21/2015  . Breathlessness on exertion 02/02/2014  . Awareness of heartbeats 02/02/2014  . Apnea, sleep 02/02/2014    Orientation RESPIRATION BLADDER Height & Weight     Self, Time, Situation, Place  Normal Continent Weight: 276 lb (125.2 kg) Height:  5\' 4"  (162.6 cm)  BEHAVIORAL SYMPTOMS/MOOD NEUROLOGICAL BOWEL NUTRITION STATUS   (none)  (none) Continent Diet (Diet: Clear Liquid to be advanced. )  AMBULATORY STATUS  COMMUNICATION OF NEEDS Skin   Extensive Assist Verbally Surgical wounds (Incision: Left Knee)                       Personal Care Assistance Level of Assistance  Bathing, Feeding, Dressing Bathing Assistance: Limited assistance Feeding assistance: Independent Dressing Assistance: Limited assistance     Functional Limitations Info  Sight, Hearing, Speech Sight Info: Adequate Hearing Info: Adequate Speech Info: Adequate    SPECIAL CARE FACTORS FREQUENCY  PT (By licensed PT), OT (By licensed OT)     PT Frequency:  (5) OT Frequency:  (5)            Contractures      Additional Factors Info  Code Status, Allergies Code Status Info:  (Full Code. ) Allergies Info:  (Percocet Oxycodone-acetaminophen, Tramadol Hcl, Vicodin Hydrocodone-acetaminophen)           Current Medications (01/28/2017):  This is the current hospital active medication list Current Facility-Administered Medications  Medication Dose Route Frequency Provider Last Rate Last Dose  . 0.9 % NaCl with KCl 20 mEq/ L  infusion   Intravenous Continuous Poggi, Marshall Cork, MD      . acetaminophen (TYLENOL) tablet 650 mg  650 mg Oral Q6H PRN Poggi, Marshall Cork, MD       Or  . acetaminophen (TYLENOL) suppository 650 mg  650 mg Rectal Q6H PRN Poggi, Marshall Cork, MD      . acetaminophen (TYLENOL) tablet 1,000 mg  1,000 mg Oral Q6H Poggi, Marshall Cork, MD      . Derrill Memo ON 01/29/2017] aspirin EC tablet 81 mg  81 mg Oral QAC breakfast Poggi, Marshall Cork, MD      . Derrill Memo ON 01/29/2017] atenolol (TENORMIN) tablet 100 mg  100 mg Oral QAC breakfast Poggi, Marshall Cork, MD      . bisacodyl (DULCOLAX) suppository 10 mg  10 mg Rectal Daily PRN Poggi, Marshall Cork, MD      . ceFAZolin (ANCEF) 3 g in dextrose 5 % 50 mL IVPB  3 g Intravenous Q6H Poggi, Marshall Cork, MD      . diphenhydrAMINE (BENADRYL) 12.5 MG/5ML elixir 12.5-25 mg  12.5-25 mg Oral Q4H PRN Poggi, Marshall Cork, MD      . docusate sodium (COLACE) capsule 100 mg  100 mg Oral BID Poggi, Marshall Cork, MD      . Derrill Memo  ON 01/29/2017] enoxaparin (LOVENOX) injection 40 mg  40 mg Subcutaneous Q24H Poggi, Marshall Cork, MD      . Derrill Memo ON 01/29/2017] HAIR/SKIN/NAILS TABS   Oral QAC breakfast Poggi, Marshall Cork, MD      . haloperidol (HALDOL) tablet 6 mg  6 mg Oral QHS Poggi, Marshall Cork, MD      . HYDROmorphone (DILAUDID) injection 1-2 mg  1-2 mg Intravenous Q2H PRN Poggi, Marshall Cork, MD      . HYDROmorphone (DILAUDID) tablet 2-4 mg  2-4 mg Oral Q4H PRN Poggi, Marshall Cork, MD      . ketorolac (TORADOL) 15 MG/ML injection 15 mg  15 mg Intravenous Q6H Poggi, Marshall Cork, MD      . ketorolac (TORADOL) 15 MG/ML injection 30 mg  30 mg Intravenous Once Poggi, Marshall Cork, MD      . Derrill Memo ON 01/29/2017] lisinopril-hydrochlorothiazide (PRINZIDE,ZESTORETIC) 20-25 MG per tablet 1 tablet  1 tablet Oral QAC breakfast Poggi, Marshall Cork, MD      . magnesium hydroxide (MILK OF MAGNESIA) suspension 30 mL  30 mL Oral Daily PRN Poggi, Marshall Cork, MD      . metFORMIN (GLUCOPHAGE) tablet 500 mg  500 mg Oral BID WC Poggi, Marshall Cork, MD      . metoCLOPramide (REGLAN) tablet 5-10 mg  5-10 mg Oral Q8H PRN Poggi, Marshall Cork, MD       Or  . metoCLOPramide (REGLAN) injection 5-10 mg  5-10 mg Intravenous Q8H PRN Poggi, Marshall Cork, MD      . Derrill Memo ON 01/29/2017] multivitamin with minerals tablet 1 tablet  1 tablet Oral QAC breakfast Poggi, Marshall Cork, MD      . ondansetron (ZOFRAN) tablet 4 mg  4 mg Oral Q6H PRN Poggi, Marshall Cork, MD       Or  . ondansetron (ZOFRAN) injection 4 mg  4 mg Intravenous Q6H PRN Poggi, Marshall Cork, MD      . pantoprazole (PROTONIX) EC tablet 40 mg  40 mg Oral BID Poggi, Marshall Cork, MD      . rosuvastatin (CRESTOR) tablet 40 mg  40 mg Oral QHS Poggi, Marshall Cork, MD      . Derrill Memo ON 01/29/2017] sertraline (ZOLOFT) tablet 50 mg  50 mg Oral QAC breakfast Poggi, Marshall Cork, MD      . sodium phosphate (FLEET) 7-19 GM/118ML enema 1 enema  1 enema Rectal Once PRN Poggi, Marshall Cork, MD      . Vitamin C CHEW 1 tablet  1 tablet Oral Daily PRN Poggi, Marshall Cork, MD         Discharge Medications: Please see  discharge summary for a list of discharge medications.  Relevant Imaging Results:  Relevant Lab Results:  Additional Information  (SSN: [id number])  Parag Dorton, [name], LCSW

## 2017-01-29 ENCOUNTER — Encounter: Payer: Self-pay | Admitting: Surgery

## 2017-01-29 LAB — CBC WITH DIFFERENTIAL/PLATELET
Basophils Absolute: 0.1 10*3/uL (ref 0–0.1)
Basophils Relative: 1 %
Eosinophils Absolute: 0.2 10*3/uL (ref 0–0.7)
Eosinophils Relative: 3 %
HCT: 34.3 % — ABNORMAL LOW (ref 35.0–47.0)
Hemoglobin: 11.9 g/dL — ABNORMAL LOW (ref 12.0–16.0)
Lymphocytes Relative: 19 %
Lymphs Abs: 1.7 10*3/uL (ref 1.0–3.6)
MCH: 32.7 pg (ref 26.0–34.0)
MCHC: 34.6 g/dL (ref 32.0–36.0)
MCV: 94.6 fL (ref 80.0–100.0)
Monocytes Absolute: 0.9 10*3/uL (ref 0.2–0.9)
Monocytes Relative: 10 %
Neutro Abs: 6.1 10*3/uL (ref 1.4–6.5)
Neutrophils Relative %: 67 %
Platelets: 216 10*3/uL (ref 150–440)
RBC: 3.63 MIL/uL — ABNORMAL LOW (ref 3.80–5.20)
RDW: 14.5 % (ref 11.5–14.5)
WBC: 9 10*3/uL (ref 3.6–11.0)

## 2017-01-29 LAB — BASIC METABOLIC PANEL
Anion gap: 8 (ref 5–15)
BUN: 16 mg/dL (ref 6–20)
CO2: 26 mmol/L (ref 22–32)
Calcium: 8.2 mg/dL — ABNORMAL LOW (ref 8.9–10.3)
Chloride: 105 mmol/L (ref 101–111)
Creatinine, Ser: 0.74 mg/dL (ref 0.44–1.00)
GFR calc Af Amer: >60 mL/min (ref >60)
GFR calc non Af Amer: >60 mL/min (ref >60)
Glucose, Bld: 120 mg/dL — ABNORMAL HIGH (ref 65–99)
Potassium: 3.2 mmol/L — ABNORMAL LOW (ref 3.5–5.1)
Sodium: 139 mmol/L (ref 135–145)

## 2017-01-29 MED ORDER — POTASSIUM CHLORIDE 20 MEQ PO PACK
40.0000 meq | PACK | Freq: Once | ORAL | Status: AC
  Administered 2017-01-29: 40 meq via ORAL
  Filled 2017-01-29: qty 2

## 2017-01-29 NOTE — Evaluation (Signed)
Occupational Therapy Evaluation Patient Details Name: Lauren [name] MRN: [id number] DOB: March 06, 1951 Today's Date: 01/29/2017    History of Present Illness Pt underwent L TKR without reported post-op complications. She is POD#1 this date. PMH includes OSA, anxiety, schizoaffective, diabetes, fibromyalgia, and RA.   Clinical Impression   Pt seen for OT evaluation this date. Pt reports independent at baseline, however some of the information provided conflicts with previous documentation in chart (use of AD for community mobility, assist needed for IADL). Pt reports her 40yo son lives with her and is able to care for her at home, as he does not work. Pt presents with significant and limiting pain in L knee, decreased activity tolerance due to pain, decreased knowledge of AE for ADL, endorses no falls in past 12 months, and need for increased assistance with functional mobility and functional self care tasks. Pt will benefit from skilled OT services to address noted impairments and functional deficits in functional mobility for ADL and self care skills as well as home/routines modifications to support falls prevention and safety in the home. While pt is generally at min assist level for LB ADL during session, due to impaired functional mobility for ADL and significant pain levels limiting pt's ability to participate in functional transfers during session, OT feels strongly that pt would be best served by completing some STR prior to return home to maximize return to PLOF and minimize risk of falls/injury/rehospitalization. Will continue to monitor progress for appropriate discharge disposition.    Follow Up Recommendations  SNF    Equipment Recommendations  3 in 1 bedside commode;Other (comment) (grab bars installed in shower)    Recommendations for Other Services       Precautions / Restrictions Precautions Precautions: Knee Precaution Booklet Issued: No Restrictions Weight Bearing  Restrictions: Yes LLE Weight Bearing: Weight bearing as tolerated      Mobility Bed Mobility Overal bed mobility: Needs Assistance      General bed mobility comments: deferred, d/t pt up in recliner for session  Transfers Overall transfer level: Needs assistance          General transfer comment: deferred due to pt's 9/10 L knee pain, initially attempted sit to stand from recliner but L knee very painful and pt ultimately declined    Balance Overall balance assessment: Needs assistance Sitting-balance support: No upper extremity supported;Feet supported Sitting balance-Leahy Scale: Good                                 ADL either performed or assessed with clinical judgement   ADL Overall ADL's : Needs assistance/impaired Eating/Feeding: Sitting;Set up   Grooming: Sitting;Set up   Upper Body Bathing: Sitting;Set up   Lower Body Bathing: Set up;Sitting/lateral leans;Minimal assistance   Upper Body Dressing : Set up;Sitting   Lower Body Dressing: Minimal assistance;Sitting/lateral leans;With adaptive equipment Lower Body Dressing Details (indicate cue type and reason): pt educated in use of AE for LB dressing, able to return demonstrate technique requiring min assist to adjust sock and adjust compression stockings to minimize wrinkles   Toilet Transfer Details (indicate cue type and reason): deferred due to pt's 9/10 L knee pain            General ADL Comments: pt generally min assist for LB ADL     Vision Baseline Vision/History: Wears glasses Wears Glasses: At all times Patient Visual Report: No change from baseline Vision Assessment?: No apparent  visual deficits     Perception     Praxis      Pertinent Vitals/Pain Pain Assessment: 0-10 Pain Score: 8  Pain Location: L knee, increasing to 9/10 when footrest lowered to attempt sit<>stand transfer Pain Descriptors / Indicators: Aching;Grimacing;Guarding Pain Intervention(s): Limited  activity within patient's tolerance;Monitored during session;Repositioned;Ice applied     Hand Dominance Right   Extremity/Trunk Assessment Upper Extremity Assessment Upper Extremity Assessment: Overall WFL for tasks assessed   Lower Extremity Assessment Lower Extremity Assessment: Defer to PT evaluation;LLE deficits/detail   Cervical / Trunk Assessment Cervical / Trunk Assessment: Normal   Communication Communication Communication: No difficulties   Cognition Arousal/Alertness: Awake/alert Behavior During Therapy: WFL for tasks assessed/performed Overall Cognitive Status: Within Functional Limits for tasks assessed                                     General Comments       Exercises Other Exercises Other Exercises: pt educated in falls prevention strategies to support safety in the home, home/routines modifications to maximize independence and safety Other Exercises: pt educated in cognitive behavioral strategies for pain management including PLB and pleasant imagery, pt able to return demonstrate good technique and verbalized plan to trial strategies during the day   Shoulder Instructions      Home Living Family/patient expects to be discharged to:: Private residence Living Arrangements: Children (73yo son lives with her) Available Help at Discharge: Family;Available 24 hours/day (son doesn't work) Type of Home: House Home Access: Ramped entrance     [place]: One level     Bathroom Shower/Tub: Advertising copywriter:  (unclear)   Home Equipment: Grab bars - toilet          Prior Functioning/Environment Level of Independence: Independent        Comments: Pt denies use of AD for household or community mobility. Indep with ADL, IADL including driving, getting groceries, med mtg. No falls in past 12 months. **Some information conflicts with previous PT evaluation note. May need to clarify with  pt/family        OT Problem List: Pain;Decreased activity tolerance;Decreased knowledge of use of DME or AE      OT Treatment/Interventions: Self-care/ADL training;Therapeutic exercise;Therapeutic activities;Energy conservation;DME and/or AE instruction;Patient/family education    OT Goals(Current goals can be found in the care plan section) Acute Rehab OT Goals Patient Stated Goal: to have less pain OT Goal Formulation: With patient Time For Goal Achievement: 02/12/17 Potential to Achieve Goals: Good ADL Goals Pt Will Perform Lower Body Dressing: with adaptive equipment;sit to/from stand;with min guard assist;with set-up Pt Will Transfer to Toilet: with min assist;ambulating;bedside commode  OT Frequency: Min 1X/week   Barriers to D/C:            Co-evaluation              AM-PAC PT "6 Clicks" Daily Activity     Outcome Measure Help from another person eating meals?: None Help from another person taking care of personal grooming?: None Help from another person toileting, which includes using toliet, bedpan, or urinal?: A Little Help from another person bathing (including washing, rinsing, drying)?: A Little Help from another person to put on and taking off regular upper body clothing?: None Help from another person to put on and taking off regular lower body clothing?: A Little 6 Click Score: 21  End of Session    Activity Tolerance: Patient limited by pain Patient left: in chair;with call bell/phone within reach;with chair alarm set;Other (comment) (polar care in place, rolled towel under LLE)  OT Visit Diagnosis: Other abnormalities of gait and mobility (R26.89);Pain Pain - Right/Left: Left Pain - part of body: Knee                Time: 1125-1204 OT Time Calculation (min): 39 min Charges:  OT General Charges $OT Visit: 1 Procedure OT Evaluation $OT Eval Low Complexity: 1 Procedure OT Treatments $Self Care/Home Management : 23-37 mins G-Codes:     Jeni Salles, MPH, MS, OTR/L ascom [phone] 01/29/17, 2:02 PM

## 2017-01-29 NOTE — Progress Notes (Signed)
Subjective: 1 Day Post-Op Procedure(s) (LRB): TOTAL KNEE ARTHROPLASTY (Left) Patient reports pain as 6 on 0-10 scale.   Patient is well, but has had some minor complaints of nausea/vomiting Plan is to go home with HHPT after hospital stay. Negative for chest pain and shortness of breath Fever: no Gastrointestinal:Positive for nausea and vomiting yesterday, has resolved so far this AM.  Objective: Vital signs in last 24 hours: Temp:  [97.5 F (36.4 C)-98.2 F (36.8 C)] 98.2 F (36.8 C) (05/22 2315) Pulse Rate:  [53-66] 60 (05/22 2315) Resp:  [18-22] 18 (05/22 2315) BP: (119-189)/(56-92) 134/58 (05/22 2315) SpO2:  [97 %-100 %] 98 % (05/22 2315)  Intake/Output from previous day:  Intake/Output Summary (Last 24 hours) at 01/29/17 0746 Last data filed at 01/29/17 0500  Gross per 24 hour  Intake          2716.66 ml  Output             1610 ml  Net          1106.66 ml    Intake/Output this shift: No intake/output data recorded.  Labs:  Recent Labs  01/29/17 0359  HGB 11.9*    Recent Labs  01/29/17 0359  WBC 9.0  RBC 3.63*  HCT 34.3*  PLT 216    Recent Labs  01/29/17 0359  NA 139  K 3.2*  CL 105  CO2 26  BUN 16  CREATININE 0.74  GLUCOSE 120*  CALCIUM 8.2*   No results for input(s): LABPT, INR in the last 72 hours.   EXAM General - Patient is Alert, Appropriate and Oriented Extremity - ABD soft Sensation intact distally Intact pulses distally Dorsiflexion/Plantar flexion intact Incision: scant drainage No cellulitis present Dressing/Incision - blood tinged drainage Motor Function - intact, moving foot and toes well on exam.  Abdomen soft this AM without tympany.  Normal BS.  Past Medical History:  Diagnosis Date  . Anxiety   . Diabetes mellitus   . Fibromyalgia   . GERD (gastroesophageal reflux disease)   . Heart murmur   . Herniated disc   . Hyperlipidemia   . Hypertension   . Lack of bladder control   . Lung tumor   . Rheumatoid  arthritis ([place])   . Schizoaffective disorder ([place])   . 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 last seizure 2013!  [name] Sleep apnea     Assessment/Plan: 1 Day Post-Op Procedure(s) (LRB): TOTAL KNEE ARTHROPLASTY (Left) Active Problems:   Status post total knee replacement using cement, left  Estimated body mass index is 47.38 kg/m as calculated from the following:   Height as of this encounter: 5\' 4"  (1.626 m).   Weight as of this encounter: 125.2 kg (276 lb). Advance diet Up with therapy D/C IV fluids when tolerating PO intake.  Labs reviewed, K+ 3.2, likely from N/V.  Will supplement with additional Klor-con.  CBC and BMP ordered for tomorrow morning. Up with therapy today, currently recommending HHPT. Will begin working on having a BM. Plan will be for discharge home tomorrow pending progress with PT today.  DVT Prophylaxis - Lovenox, Foot Pumps and TED hose Weight-Bearing as tolerated to left leg  J. Cameron Proud, PA-C [facility] Orthopaedic Surgery 01/29/2017, 7:46 AM

## 2017-01-29 NOTE — Clinical Social Work Note (Signed)
Clinical Social Work Assessment  Patient Details  Name: Lauren [name] MRN: [id number] Date of Birth: [birth year]  Date of referral:  01/29/17               Reason for consult:  Facility Placement                Permission sought to share information with:  Chartered certified accountant granted to share information::  Yes, Verbal Permission Granted  Name::      [place]::   [place]   Relationship::     Contact Information:     Housing/Transportation Living arrangements for the past 2 months:  [place] of Information:  Patient Patient Interpreter Needed:  None Criminal Activity/Legal Involvement Pertinent to Current Situation/Hospitalization:  No - Comment as needed Significant Relationships:  Adult Children Lives with:  Adult Children Do you feel safe going back to the place where you live?  Yes Need for family participation in patient care:  Yes (Comment)  Care giving concerns:  Patient lives in [place] with her son Lauren [name].    Social Worker assessment / plan:  [place] representative (CSW) received SNF consult. PT recommended home health this morning and changed recommendation to SNF this afternoon. CSW met with patient alone at bedside to discuss D/C plan. Patient was alert and oriented X4 and was laying in the bed. CSW introduced self and explained role of CSW department. CSW is familiar with patient because she attended joint class. Patient reported that she lives with her son Lauren [name] in [place]. CSW explained SNF process and that PT is now recommending SNF. Patient is agreeable to SNF search in [facility]. FL2 complete and faxed out.   CSW presented bed offers to patient and she chose [facility]. Plan is for patient to D/C to [facility] tomorrow of medically stable. [facility] liaison is aware of accepted bed offer. CSW will continue to follow and assist as needed.   Employment status:  Retired Designer, industrial/product PT Recommendations:  [place] / Referral to community resources:  [place]  Patient/Family's Response to care:  Patient is agreeable to D/C to [facility].   Patient/Family's Understanding of and Emotional Response to Diagnosis, Current Treatment, and Prognosis:  Patient was very pleasant and thanked CSW for assistance.   Emotional Assessment Appearance:  Appears stated age Attitude/Demeanor/Rapport:    Affect (typically observed):  Accepting, Adaptable, Pleasant Orientation:  Oriented to Self, Oriented to Place, Oriented to  Time, Oriented to Situation Alcohol / Substance use:  Not Applicable Psych involvement (Current and /or in the community):  No (Comment)  Discharge Needs  Concerns to be addressed:  Discharge Planning Concerns Readmission within the last 30 days:  No Current discharge risk:  Dependent with Mobility Barriers to Discharge:  Continued Medical Work up   [employer], [name], LCSW 01/29/2017, 3:41 PM

## 2017-01-29 NOTE — Care Management Note (Signed)
Case Management Note  Patient Details  Name: Lauren [name] MRN: [id number] Date of Birth: May 08, 1951  Subjective/Objective:  POD # 1 left TKA. Met with patient at bedside to discuss discharge planning. She lives with her children. PCP is Dr. Manuella Ghazi. Offered choice of home health agencies. Referral to [facility] for PT. She will need a bsc and rolling walker.  Ordered from [facility]. Pharmacy: [facility] [phone]. Called Lovenox 40 mg # 14 no refills.                  Action/Plan: [facility] for PT, [facility] for DME, Lovenox called in  Expected Discharge Date:                  Expected Discharge Plan:  [place]  In-House Referral:     Discharge planning Services  CM Consult  Post Acute Care Choice:  Durable Medical Equipment Choice offered to:  Patient  DME Arranged:  Bedside commode, Walker rolling DME Agency:  [place]:  PT [place]:  [facility] (now [facility])  Status of Service:  In process, will continue to follow  If discussed at Long Length of Stay Meetings, dates discussed:    Additional Comments:  Jolly Mango, RN 01/29/2017, 9:52 AM

## 2017-01-29 NOTE — Care Management (Signed)
Cancelled home health PT with [facility] and DME with [facility]. Cancelled Lovenox order. Patient to be discharged to [facility] tomorrow.

## 2017-01-29 NOTE — Clinical Social Work Placement (Signed)
   CLINICAL SOCIAL WORK PLACEMENT  NOTE  Date:  01/29/2017  Patient Details  Name: MAIREAD SCHWARZKOPF MRN: [id number] Date of Birth: [birth year]  Clinical Social Work is seeking post-discharge placement for this patient at the [place] level of care (*CSW will initial, date and re-position this form in  chart as items are completed):  Yes   Patient/family provided with [place] Work Department's list of facilities offering this level of care within the geographic area requested by the patient (or if unable, by the patient's family).  Yes   Patient/family informed of their freedom to choose among providers that offer the needed level of care, that participate in Medicare, Medicaid or managed care program needed by the patient, have an available bed and are willing to accept the patient.  Yes   Patient/family informed of [facility]'s ownership interest in [facility] and [facility], as well as of the fact that they are under no obligation to receive care at these facilities.  PASRR submitted to EDS on 01/28/17     PASRR number received on 01/28/17     Existing PASRR number confirmed on       FL2 transmitted to all facilities in geographic area requested by pt/family on 01/28/17     FL2 transmitted to all facilities within larger geographic area on       Patient informed that his/her managed care company has contracts with or will negotiate with certain facilities, including the following:        Yes   Patient/family informed of bed offers received.  Patient chooses bed at  ([facility] )     Physician recommends and patient chooses bed at      Patient to be transferred to   on  .  Patient to be transferred to facility by       Patient family notified on   of transfer.  Name of family member notified:        PHYSICIAN       Additional Comment:    _______________________________________________ Esmeralda Malay, [name], LCSW 01/29/2017, 3:40  PM

## 2017-01-29 NOTE — Progress Notes (Signed)
Physical Therapy Treatment Patient Details Name: Lauren [name] MRN: [id number] DOB: February 28, 1951 Today's Date: 01/29/2017    History of Present Illness Pt underwent L TKR without reported post-op complications. She is POD#1 this date. PMH includes OSA, anxiety, schizoaffective, diabetes, fibromyalgia, and RA.    PT Comments    Patient with slight increase in gait distance (40') and overall activity tolerance; however, limited by pain in L knee.  Very limited weight acceptance to L LE with all functional activities, but no overt buckling or LOB noted.  Patient very motivated, but able to demonstrate only minimal progression this date; not progressing at rate expected at this point in post-op course.  Will continue mobility progression/assessment this PM; if remains limited, may need to consider transition to STR upon discharge.    Follow Up Recommendations  Home health PT     Equipment Recommendations  Rolling walker with 5" wheels;Other (comment);3in1 (PT)    Recommendations for Other Services       Precautions / Restrictions Precautions Precautions: Knee Restrictions Weight Bearing Restrictions: Yes LLE Weight Bearing: Weight bearing as tolerated    Mobility  Bed Mobility Overal bed mobility: Needs Assistance Bed Mobility: Supine to Sit     Supine to sit: Min assist     General bed mobility comments: difficulty with movement sequence and truncal elevation  Transfers Overall transfer level: Needs assistance Equipment used: Rolling walker (2 wheeled) Transfers: Sit to/from Stand Sit to Stand: Min guard         General transfer comment: heavy use of UEs on RW to assist with lift off; min cuing for L LE position  Ambulation/Gait Ambulation/Gait assistance: Min assist Ambulation Distance (Feet): 40 Feet Assistive device: Rolling walker (2 wheeled)     Gait velocity interpretation: Below normal speed for age/gender General Gait Details: step to gait pattern  with decreased stance time L LE, brisk R LE advancement with heavy force of contact upon initial contact. Multiple standing rest breaks requried throughout short distance; limited by pain/fatigue.   Stairs            Wheelchair Mobility    Modified Rankin (Stroke Patients Only)       Balance Overall balance assessment: Needs assistance Sitting-balance support: No upper extremity supported;Feet supported Sitting balance-Leahy Scale: Good     Standing balance support: Bilateral upper extremity supported Standing balance-Leahy Scale: Fair                              Cognition Arousal/Alertness: Awake/alert Behavior During Therapy: WFL for tasks assessed/performed Overall Cognitive Status: Within Functional Limits for tasks assessed                                        Exercises Total Joint Exercises Goniometric ROM: 7-80 degrees, AAROM (limited by pain this date) Other Exercises Other Exercises: Supine LE therex, 1x12, act ROM: ankle pumps, SAQs, heel slides, hip abduct/adduct and SLR. Min cuing/assist for motor planning and isolated movement throughout targeted range    General Comments        Pertinent Vitals/Pain Pain Assessment: 0-10 Pain Score: 6  Pain Location: L knee Pain Descriptors / Indicators: Aching;Grimacing;Guarding Pain Intervention(s): Limited activity within patient's tolerance;Monitored during session;Repositioned;Patient requesting pain meds-RN notified;RN gave pain meds during session    Home Living  Prior Function            PT Goals (current goals can now be found in the care plan section) Acute Rehab PT Goals Patient Stated Goal: Return to prior function at home PT Goal Formulation: With patient Time For Goal Achievement: 02/11/17 Potential to Achieve Goals: Good Progress towards PT goals: Progressing toward goals    Frequency    BID      PT Plan Current plan  remains appropriate    Co-evaluation              AM-PAC PT "6 Clicks" Daily Activity  Outcome Measure  Difficulty turning over in bed (including adjusting bedclothes, sheets and blankets)?: Total Difficulty moving from lying on back to sitting on the side of the bed? : Total Difficulty sitting down on and standing up from a chair with arms (e.g., wheelchair, bedside commode, etc,.)?: Total Help needed moving to and from a bed to chair (including a wheelchair)?: A Little Help needed walking in hospital room?: A Little Help needed climbing 3-5 steps with a railing? : A Lot 6 Click Score: 11    End of Session Equipment Utilized During Treatment: Gait belt Activity Tolerance: Patient tolerated treatment well Patient left: in chair;with call bell/phone within reach;with chair alarm set;with nursing/sitter in room   PT Visit Diagnosis: Unsteadiness on feet (R26.81);Muscle weakness (generalized) (M62.81)     Time: 0352-4818 PT Time Calculation (min) (ACUTE ONLY): 31 min  Charges:  $Gait Training: 8-22 mins $Therapeutic Exercise: 8-22 mins                    G Codes:       Caci Orren H. Owens Shark, PT, DPT, NCS 01/29/17, 10:04 AM [phone]

## 2017-01-29 NOTE — Anesthesia Postprocedure Evaluation (Signed)
Anesthesia Post Note  Patient: Lauren [name]  Procedure(s) Performed: Procedure(s) (LRB): TOTAL KNEE ARTHROPLASTY (Left)  Patient location during evaluation: Nursing Unit Anesthesia Type: Spinal Level of consciousness: awake and alert Pain management: pain level controlled Vital Signs Assessment: post-procedure vital signs reviewed and stable Respiratory status: spontaneous breathing and respiratory function stable Cardiovascular status: blood pressure returned to baseline and stable Postop Assessment: spinal receding and no headache Anesthetic complications: no     Last Vitals:  Vitals:   01/28/17 1924 01/28/17 2315  BP: (!) 127/56 (!) 134/58  Pulse: 66 60  Resp: 19 18  Temp: 36.4 C 36.8 C    Last Pain:  Vitals:   01/29/17 0607  TempSrc:   PainSc: Asleep                 Brantley Fling

## 2017-01-29 NOTE — Progress Notes (Signed)
Physical Therapy Treatment Patient Details Name: Lauren [name] MRN: [id number] DOB: 1951-03-19 Today's Date: 01/29/2017    History of Present Illness Pt underwent L TKR without reported post-op complications. She is POD#1 this date. PMH includes OSA, anxiety, schizoaffective, diabetes, fibromyalgia, and RA.    PT Comments    Continues to require significant assist for bed mobility, especially with head of bed flat and bedrails removed (to simulate home environment).  Unable to tolerate gait distance beyond 67' with RW (min assist) due to pain despite great motivation and desire to progress. Concern over ability to safely negotiate home environment at this point.  Given concern, and slow rate of mobility progression, discharge recommendations updated to STR.  Patient aware and in agreement; CSW informed/aware.    Follow Up Recommendations  SNF     Equipment Recommendations  Rolling walker with 5" wheels;Other (comment);3in1 (PT)    Recommendations for Other Services       Precautions / Restrictions Precautions Precautions: Knee Precaution Booklet Issued: No Restrictions Weight Bearing Restrictions: Yes LLE Weight Bearing: Weight bearing as tolerated    Mobility  Bed Mobility Overal bed mobility: Needs Assistance Bed Mobility: Supine to Sit     Supine to sit: Mod assist     General bed mobility comments: assist for LE management, truncal elevation and repositioning  Transfers Overall transfer level: Needs assistance   Transfers: Sit to/from Stand Sit to Stand: Min guard;Min assist         General transfer comment: cuing for hand/foot placement; excessive weight shift to R LE throughout  Ambulation/Gait Ambulation/Gait assistance: Min assist Ambulation Distance (Feet): 75 Feet Assistive device: Rolling walker (2 wheeled)       General Gait Details: step to gait pattern with notable decrease in L LE stance time/weight acceptance; min cuing for walker  position to maximize ability to offset WBing bilat UEs.  Very slow and effortful; additional distance limited by pain/fatigue.   Stairs            Wheelchair Mobility    Modified Rankin (Stroke Patients Only)       Balance Overall balance assessment: Needs assistance Sitting-balance support: No upper extremity supported;Feet supported Sitting balance-Leahy Scale: Good     Standing balance support: Bilateral upper extremity supported Standing balance-Leahy Scale: Fair                              Cognition Arousal/Alertness: Awake/alert Behavior During Therapy: WFL for tasks assessed/performed Overall Cognitive Status: Within Functional Limits for tasks assessed                                        Exercises Other Exercises Other Exercises: Sit/stand from recliner, edge of bed with RW, min assist-increased difficulty from lower seating surfaces Other Exercises: Bed mobility from flat surface without bedrails (to simulate home environment), mod assist.  Significant difficulty coordinating movement transition and managing L LE due to pain.  Unable to complete without extensive assist from therapist.    General Comments        Pertinent Vitals/Pain Pain Assessment: 0-10 Pain Score: 6  Pain Location: L knee Pain Descriptors / Indicators: Aching;Grimacing;Guarding Pain Intervention(s): Limited activity within patient's tolerance;Monitored during session;Repositioned;Premedicated before session    Home Living Family/patient expects to be discharged to:: Private residence Living Arrangements: Children (64yo son lives with her)  Available Help at Discharge: Family;Available 24 hours/day (son doesn't work) Type of Home: House Home Access: Ramped entrance   [place]: One [place]: Grab bars - toilet      Prior Function Level of Independence: Independent      Comments: Pt denies use of AD for household or community  mobility. Indep with ADL, IADL including driving, getting groceries, med mtg. No falls in past 12 months. **Some information conflicts with previous PT evaluation note. May need to clarify with pt/family   PT Goals (current goals can now be found in the care plan section) Acute Rehab PT Goals Patient Stated Goal: to have less pain PT Goal Formulation: With patient Time For Goal Achievement: 02/11/17 Potential to Achieve Goals: Good Progress towards PT goals: Progressing toward goals    Frequency    BID      PT Plan Discharge plan needs to be updated    Co-evaluation              AM-PAC PT "6 Clicks" Daily Activity  Outcome Measure  Difficulty turning over in bed (including adjusting bedclothes, sheets and blankets)?: Total Difficulty moving from lying on back to sitting on the side of the bed? : Total Difficulty sitting down on and standing up from a chair with arms (e.g., wheelchair, bedside commode, etc,.)?: Total Help needed moving to and from a bed to chair (including a wheelchair)?: A Little Help needed walking in hospital room?: A Little Help needed climbing 3-5 steps with a railing? : A Lot 6 Click Score: 11    End of Session Equipment Utilized During Treatment: Gait belt Activity Tolerance: Patient limited by pain Patient left: in bed;with call bell/phone within reach;with bed alarm set   PT Visit Diagnosis: Unsteadiness on feet (R26.81);Muscle weakness (generalized) (M62.81)     Time: 6629-4765 PT Time Calculation (min) (ACUTE ONLY): 25 min  Charges:  $Gait Training: 8-22 mins $Therapeutic Activity: 8-22 mins                    G Codes:       Corynn Solberg H. Owens Shark, PT, DPT, NCS 01/29/17, 3:43 PM [phone]

## 2017-01-30 DIAGNOSIS — E784 Other hyperlipidemia: Secondary | ICD-10-CM | POA: Diagnosis not present

## 2017-01-30 DIAGNOSIS — E109 Type 1 diabetes mellitus without complications: Secondary | ICD-10-CM | POA: Diagnosis not present

## 2017-01-30 DIAGNOSIS — M02061 Arthropathy following intestinal bypass, right knee: Secondary | ICD-10-CM | POA: Diagnosis not present

## 2017-01-30 DIAGNOSIS — F5109 Other insomnia not due to a substance or known physiological condition: Secondary | ICD-10-CM | POA: Diagnosis not present

## 2017-01-30 DIAGNOSIS — E1165 Type 2 diabetes mellitus with hyperglycemia: Secondary | ICD-10-CM | POA: Diagnosis not present

## 2017-01-30 DIAGNOSIS — E569 Vitamin deficiency, unspecified: Secondary | ICD-10-CM | POA: Diagnosis not present

## 2017-01-30 DIAGNOSIS — G8918 Other acute postprocedural pain: Secondary | ICD-10-CM | POA: Diagnosis not present

## 2017-01-30 DIAGNOSIS — Z96652 Presence of left artificial knee joint: Secondary | ICD-10-CM | POA: Diagnosis not present

## 2017-01-30 DIAGNOSIS — I10 Essential (primary) hypertension: Secondary | ICD-10-CM | POA: Diagnosis not present

## 2017-01-30 DIAGNOSIS — E119 Type 2 diabetes mellitus without complications: Secondary | ICD-10-CM | POA: Diagnosis not present

## 2017-01-30 DIAGNOSIS — K219 Gastro-esophageal reflux disease without esophagitis: Secondary | ICD-10-CM | POA: Diagnosis not present

## 2017-01-30 DIAGNOSIS — M6281 Muscle weakness (generalized): Secondary | ICD-10-CM | POA: Diagnosis not present

## 2017-01-30 DIAGNOSIS — R2689 Other abnormalities of gait and mobility: Secondary | ICD-10-CM | POA: Diagnosis not present

## 2017-01-30 DIAGNOSIS — M25562 Pain in left knee: Secondary | ICD-10-CM | POA: Diagnosis not present

## 2017-01-30 DIAGNOSIS — Z7401 Bed confinement status: Secondary | ICD-10-CM | POA: Diagnosis not present

## 2017-01-30 LAB — CBC
HCT: 32.5 % — ABNORMAL LOW (ref 35.0–47.0)
Hemoglobin: 11.3 g/dL — ABNORMAL LOW (ref 12.0–16.0)
MCH: 32.5 pg (ref 26.0–34.0)
MCHC: 34.7 g/dL (ref 32.0–36.0)
MCV: 93.7 fL (ref 80.0–100.0)
Platelets: 208 10*3/uL (ref 150–440)
RBC: 3.47 MIL/uL — ABNORMAL LOW (ref 3.80–5.20)
RDW: 14.4 % (ref 11.5–14.5)
WBC: 10.9 10*3/uL (ref 3.6–11.0)

## 2017-01-30 LAB — BASIC METABOLIC PANEL
Anion gap: 6 (ref 5–15)
BUN: 11 mg/dL (ref 6–20)
CO2: 28 mmol/L (ref 22–32)
Calcium: 8.6 mg/dL — ABNORMAL LOW (ref 8.9–10.3)
Chloride: 101 mmol/L (ref 101–111)
Creatinine, Ser: 0.68 mg/dL (ref 0.44–1.00)
GFR calc Af Amer: >60 mL/min (ref >60)
GFR calc non Af Amer: >60 mL/min (ref >60)
Glucose, Bld: 140 mg/dL — ABNORMAL HIGH (ref 65–99)
Potassium: 3.7 mmol/L (ref 3.5–5.1)
Sodium: 135 mmol/L (ref 135–145)

## 2017-01-30 MED ORDER — TRAMADOL HCL 50 MG PO TABS: 50.0000 mg | ORAL_TABLET | Freq: Four times a day (QID) | ORAL | 0 refills | Status: DC | PRN

## 2017-01-30 MED ORDER — ENOXAPARIN SODIUM 40 MG/0.4ML ~~LOC~~ SOLN: 40.0000 mg | SUBCUTANEOUS | 0 refills | Status: DC

## 2017-01-30 MED ORDER — HYDROMORPHONE HCL 4 MG PO TABS: 2.0000 mg | ORAL_TABLET | ORAL | 0 refills | Status: DC | PRN

## 2017-01-30 NOTE — Discharge Summary (Signed)
Physician Discharge Summary  Patient ID: Lauren [name] MRN: [id number] DOB/AGE: 1951-01-10 66 y.o.  Admit date: 01/28/2017 Discharge date: 01/30/2017  Admission Diagnoses:  PRIMARY OSTEOARTHRITIS OF LEFT KNEE  Discharge Diagnoses: Patient Active Problem List   Diagnosis Date Noted  . 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 Diabetes mellitus 05/30/2016  . Hyperlipidemia 02/14/2016  . Gravida 1 10/26/2015  . Dentagra 10/26/2015  . Itch of skin 10/26/2015  . Sex counseling 10/26/2015  . Excessive sweating 07/05/2015  . Acid reflux 06/29/2015  . Encounter for pre-employment examination 06/29/2015  . Diabetes mellitus type 2, controlled, without complications ([place]) 25/42/7062  . Cardiac murmur 06/29/2015  . Arthritis of knee, degenerative 06/28/2015  . Stiffness of both knees 06/22/2015  . Insomnia w/ sleep apnea 03/21/2015  . Anxiety disorder 03/21/2015  . Hypertension 03/21/2015  . HLD (hyperlipidemia) 03/21/2015  . Paranoid schizophrenia ([place]) 03/21/2015  . Urge incontinence 03/21/2015  . Breathlessness on exertion 02/02/2014  . Awareness of heartbeats 02/02/2014  . Apnea, sleep 02/02/2014  Left knee degenerative joint disease.  Past Medical History:  Diagnosis Date  . Anxiety   . Diabetes mellitus   . Fibromyalgia   . GERD (gastroesophageal reflux disease)   . Heart murmur   . Herniated disc   . Hyperlipidemia   . Hypertension   . Lack of bladder control   . Lung tumor   . Rheumatoid arthritis ([place])   . Schizoaffective disorder ([place])   . 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 last seizure 2013!  [name] Sleep apnea      Transfusion: None.   Consultants (if any):   Discharged Condition: Improved  Hospital Course: Lauren [name] is an 66 y.o. female who was admitted 01/28/2017 with a diagnosis of left knee degenerative joint disease and went to the operating room on 01/28/2017 and underwent the above named procedures.     Surgeries: Procedure(s): TOTAL KNEE ARTHROPLASTY on 01/28/2017 Patient tolerated the surgery well. Taken to PACU where she was stabilized and then transferred to the orthopedic floor.  Started on Lovenox 40mg  q 24 hrs. Foot pumps applied bilaterally at 80 mm. Heels elevated on bed with rolled towels. No evidence of DVT. Negative [place]. Physical therapy started on day #1 for gait training and transfer. OT started day #1 for ADL and assisted devices.  Patient's IV and foley were removed on POD1.  Implants: left TKA using all-cemented [employer] Vanguard system with a 62.5 mm PCR femur and a 67 mm tibial tray with a 10 mm AS E-poly insert.  She was given perioperative antibiotics:  Anti-infectives    Start     Dose/Rate Route Frequency Ordered Stop   01/28/17 1400  ceFAZolin (ANCEF) 3 g in dextrose 5 % 50 mL IVPB     3 g 130 mL/hr over 30 Minutes Intravenous Every 6 hours 01/28/17 1106 01/29/17 0307   01/27/17 2230  ceFAZolin (ANCEF) 3 g in dextrose 5 % 50 mL IVPB     3 g 130 mL/hr over 30 Minutes Intravenous  Once 01/27/17 2219 01/28/17 0817    .  She was given sequential compression devices, early ambulation, and Lovenox for DVT prophylaxis.  She benefited maximally from the hospital stay and there were no complications.    Recent vital signs:  Vitals:   01/29/17 2323 01/30/17 0726  BP: (!) 159/71 (!) 158/80  Pulse: 88 84  Resp: 20 16  Temp: 98.4 F (36.9 C) 98.2 F (36.8  C)    Recent laboratory studies:  Lab Results  Component Value Date   HGB 11.3 (L) 01/30/2017   HGB 11.9 (L) 01/29/2017   HGB 13.0 01/22/2017   Lab Results  Component Value Date   WBC 10.9 01/30/2017   PLT 208 01/30/2017   Lab Results  Component Value Date   INR 0.94 01/22/2017   Lab Results  Component Value Date   NA 135 01/30/2017   K 3.7 01/30/2017   CL 101 01/30/2017   CO2 28 01/30/2017   BUN 11 01/30/2017   CREATININE 0.68 01/30/2017   GLUCOSE 140 (H) 01/30/2017    Discharge  Medications:   Allergies as of 01/30/2017      Reactions   Percocet [oxycodone-acetaminophen] Diarrhea, Nausea And Vomiting, Nausea Only   Tramadol Hcl Diarrhea, Nausea And Vomiting, Nausea Only   Vicodin [hydrocodone-acetaminophen] Diarrhea, Nausea And Vomiting, Nausea Only      Medication List    TAKE these medications   aspirin EC 81 MG tablet Take 81 mg by mouth daily before breakfast.   atenolol 100 MG tablet Commonly known as:  TENORMIN Take 1 tablet (100 mg total) by mouth daily. What changed:  when to take this   diphenhydramine-acetaminophen 25-500 MG Tabs tablet Commonly known as:  TYLENOL PM Take 1 tablet by mouth at bedtime as needed (for sleep.).   enoxaparin 40 MG/0.4ML injection Commonly known as:  LOVENOX Inject 0.4 mLs (40 mg total) into the skin daily.   HAIR/SKIN/NAILS PO Take 1 tablet by mouth daily before breakfast.   haloperidol 2 MG tablet Commonly known as:  HALDOL Take 3 tablets (6 mg total) by mouth at bedtime.   HYDROmorphone 4 MG tablet Commonly known as:  DILAUDID Take 0.5-1 tablets (2-4 mg total) by mouth every 4 (four) hours as needed for severe pain.   ibuprofen 200 MG tablet Commonly known as:  ADVIL,MOTRIN Take 600 mg by mouth every 8 (eight) hours as needed (for knee pain.).   lisinopril-hydrochlorothiazide 20-25 MG tablet Commonly known as:  PRINZIDE,ZESTORETIC Take 1 tablet by mouth daily. What changed:  when to take this   [employer] Apply 1 application topically 4 (four) times daily as needed (for knee pain.). Two [employer] Essential Lotion (Aloe Vera, Almond Oil, and 6 natural anti-inflammatory essential oils; [place], [place], [place], [place], Peppermint and [place])   metFORMIN 500 MG tablet Commonly known as:  GLUCOPHAGE TAKE 1 TABLET BY MOUTH TWICE DAILY WITH A MEAL   multivitamin with minerals Tabs tablet Take 1 tablet by mouth daily before breakfast. Women's 50+   omeprazole 20 MG  capsule Commonly known as:  PRILOSEC Take 1 capsule (20 mg total) by mouth daily. What changed:  when to take this   rosuvastatin 40 MG tablet Commonly known as:  CRESTOR Take 1 tablet (40 mg total) by mouth daily. What changed:  when to take this   sertraline 50 MG tablet Commonly known as:  ZOLOFT Take 1 tablet (50 mg total) by mouth daily. What changed:  when to take this   traMADol 50 MG tablet Commonly known as:  ULTRAM Take 1-2 tablets (50-100 mg total) by mouth every 6 (six) hours as needed.   Vitamin C Chew Chew 1 tablet by mouth daily as needed (for immune system support).   ZOSTRIX EX Apply 1 application topically 4 (four) times daily as needed (for knee pain.).            Durable Medical Equipment  Start     Ordered   01/28/17 1107  DME Walker rolling  Once    Question:  Patient needs a walker to treat with the following condition  Answer:  Status post total knee replacement using cement, left   01/28/17 1106   01/28/17 1107  DME 3 n 1  Once     01/28/17 1106   01/28/17 1107  DME Bedside commode  Once    Question:  Patient needs a bedside commode to treat with the following condition  Answer:  Status post total knee replacement using cement, left   01/28/17 1106      Diagnostic Studies: Dg Chest 2 View  Result Date: 01/22/2017 CLINICAL DATA:  Preoperative exam prior to left total knee arthroplasty. 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 EXAM: CHEST  2 VIEW COMPARISON:  PA and lateral chest x-ray of July 05, 2015 FINDINGS: The lungs are adequately inflated. There is no focal infiltrate. There is no pleural effusion. The heart is top-normal in size. The pulmonary vascularity is normal. The mediastinum is normal in width. There surgical clips in the AP window. There is multilevel degenerative disc disease of the thoracic spine. IMPRESSION: There is no acute cardiopulmonary abnormality. Electronically Signed   By:  David  Martinique M.D.   On: 01/22/2017 09:56   Dg Knee Left Port  Result Date: 01/28/2017 CLINICAL DATA:  Status post total knee replacement EXAM: PORTABLE LEFT KNEE - 1-2 VIEW COMPARISON:  06/22/2015 FINDINGS: Left knee arthroplasty, in satisfactory position. Associated subcutaneous gas. Overlying skin staples. Mild soft tissue swelling. No fracture or dislocation is seen. IMPRESSION: Left knee arthroplasty, without evidence of complication. Electronically Signed   By: Julian Hy M.D.   On: 01/28/2017 10:55    Disposition: Plan will be for discharge to SNF pending bowel movement today.     Contact information for follow-up providers    Lattie Corns, PA-C Follow up in 14 day(s).   Specialty:  Physician Assistant Why:  Electa Sniff information: [place] [address] [phone]            Contact information for after-discharge care    Destination    [facility] Follow up.   Specialty:  [place] information: [address] [place] [phone]                 Signed: Judson Roch PA-C 01/30/2017, 7:38 AM

## 2017-01-30 NOTE — Progress Notes (Signed)
Physical Therapy Treatment Patient Details Name: Lauren [name] MRN: [id number] DOB: 03-09-1951 Today's Date: 01/30/2017    History of Present Illness Pt underwent L TKR without reported post-op complications. She is POD#1 this date. PMH includes OSA, anxiety, schizoaffective, diabetes, fibromyalgia, and RA.    PT Comments    Pt agreeable to PT; complains of 6/10 left knee pain as well as nausea and vomiting this morn. Pt participates in quad set in supine and in stand pre gait with education on importance of straightening knee. Pt requires heavy cueing and significant increased time for bed mobility and transfers with assist. Pt educated on stretching for left knee. Ambulation slow and guarded/antalgic with heavy reliance on rolling walker. Pt received up in chair and encouraged to quad sets and intermediate stretching. Plan to see pt this afternoon for continued progress on all range, strength and mobility.     Follow Up Recommendations  SNF     Equipment Recommendations  Rolling walker with 5" wheels;Other (comment);3in1 (PT)    Recommendations for Other Services       Precautions / Restrictions Precautions Precautions: Knee Restrictions Weight Bearing Restrictions: Yes LLE Weight Bearing: Weight bearing as tolerated    Mobility  Bed Mobility Overal bed mobility: Needs Assistance Bed Mobility: Supine to Sit     Supine to sit: Min assist;HOB elevated     General bed mobility comments: Significant increased time even with assist  Transfers Overall transfer level: Needs assistance Equipment used: Rolling walker (2 wheeled) Transfers: Sit to/from Stand Sit to Stand: Min assist         General transfer comment: cues for safe hand placement and use of LLE as much as possible  Ambulation/Gait Ambulation/Gait assistance: Min guard Ambulation Distance (Feet): 25 Feet Assistive device: Rolling walker (2 wheeled) Gait Pattern/deviations: Step-to pattern;Decreased  step length - right;Decreased stance time - left;Antalgic;Decreased weight shift to left;Trunk flexed Gait velocity: Decreaed Gait velocity interpretation: <1.8 ft/sec, indicative of risk for recurrent falls General Gait Details: slow, guarded gait with difficulty weight bearing on LLE. Cues for QS with left stance phase   Stairs            Wheelchair Mobility    Modified Rankin (Stroke Patients Only)       Balance Overall balance assessment: Needs assistance Sitting-balance support: No upper extremity supported;Feet supported Sitting balance-Leahy Scale: Good     Standing balance support: Bilateral upper extremity supported Standing balance-Leahy Scale: Fair                              Cognition Arousal/Alertness: Awake/alert Behavior During Therapy: WFL for tasks assessed/performed Overall Cognitive Status: Within Functional Limits for tasks assessed                                        Exercises Total Joint Exercises Quad Sets: Strengthening;Both;20 reps;Supine (also 10 in stand with wt shift left) Long Arc Quad: AAROM;Left;10 reps;Seated Knee Flexion: AROM;Left;10 reps;Seated (3 positions each rep with 10 second hold each) Goniometric ROM: 5-90    General Comments        Pertinent Vitals/Pain Pain Assessment: 0-10 Pain Score: 6  Pain Location: L knee Pain Descriptors / Indicators: Aching;Constant;Grimacing;Guarding;Operative site guarding;Tiring Pain Intervention(s): Monitored during session;Limited activity within patient's tolerance;Repositioned;Premedicated before session;Ice applied    Home Living  Prior Function            PT Goals (current goals can now be found in the care plan section) Progress towards PT goals: Progressing toward goals (slowly)    Frequency    BID      PT Plan Current plan remains appropriate    Co-evaluation              AM-PAC PT "6 Clicks"  Daily Activity  Outcome Measure  Difficulty turning over in bed (including adjusting bedclothes, sheets and blankets)?: A Lot Difficulty moving from lying on back to sitting on the side of the bed? : Total Difficulty sitting down on and standing up from a chair with arms (e.g., wheelchair, bedside commode, etc,.)?: Total Help needed moving to and from a bed to chair (including a wheelchair)?: A Little Help needed walking in hospital room?: A Little Help needed climbing 3-5 steps with a railing? : A Lot 6 Click Score: 12    End of Session Equipment Utilized During Treatment: Gait belt Activity Tolerance: Patient limited by pain Patient left: in chair;with call bell/phone within reach;with chair alarm set;with SCD's reapplied;Other (comment) (polar care in place)   PT Visit Diagnosis: Unsteadiness on feet (R26.81);Muscle weakness (generalized) (M62.81)     Time: 1572-6203 PT Time Calculation (min) (ACUTE ONLY): 39 min  Charges:  $Gait Training: 8-22 mins $Therapeutic Exercise: 8-22 mins $Therapeutic Activity: 8-22 mins                    G Codes:        Larae Grooms, PTA 01/30/2017, 11:19 AM

## 2017-01-30 NOTE — Discharge Instructions (Signed)

## 2017-01-30 NOTE — Progress Notes (Signed)
Rept called to Yaakov Guthrie RN at [facility]. No change in pt from AM assessment. Pain controlled. Pt had a BM after suppos, enema and fleets. Non-emergency transport called. Will continue to monitor.

## 2017-01-30 NOTE — Clinical Social Work Placement (Signed)
   CLINICAL SOCIAL WORK PLACEMENT  NOTE  Date:  01/30/2017  Patient Details  Name: Lauren [name] MRN: [id number] Date of Birth: 1951/01/07  Clinical Social Work is seeking post-discharge placement for this patient at the [place] level of care (*CSW will initial, date and re-position this form in  chart as items are completed):  Yes   Patient/family provided with [place] Work Department's list of facilities offering this level of care within the geographic area requested by the patient (or if unable, by the patient's family).  Yes   Patient/family informed of their freedom to choose among providers that offer the needed level of care, that participate in Medicare, Medicaid or managed care program needed by the patient, have an available bed and are willing to accept the patient.  Yes   Patient/family informed of [facility]'s ownership interest in [facility] and [facility], as well as of the fact that they are under no obligation to receive care at these facilities.  PASRR submitted to EDS on 01/28/17     PASRR number received on 01/28/17     Existing PASRR number confirmed on       FL2 transmitted to all facilities in geographic area requested by pt/family on 01/28/17     FL2 transmitted to all facilities within larger geographic area on       Patient informed that his/her managed care company has contracts with or will negotiate with certain facilities, including the following:        Yes   Patient/family informed of bed offers received.  Patient chooses bed at  ([facility] )     Physician recommends and patient chooses bed at      Patient to be transferred to  ([facility] ) on 01/30/17.  Patient to be transferred to facility by  [facility] )     Patient family notified on 01/30/17 of transfer.  Name of family member notified:   (CSW attempted to contact patient's sister [name] however the phone number was disconnected.  Patient reported that she made her son [name] aware of D/C today. )     PHYSICIAN       Additional Comment:    _______________________________________________ [name], [name], LCSW 01/30/2017, 10:38 AM

## 2017-01-30 NOTE — Progress Notes (Signed)
Pt discharged to [facility] via stretcher via non-emergency transport without incident per MD order. No change in pt from AM assessment. Pt pain controlled on d/c. Vital signs stable.

## 2017-01-30 NOTE — Progress Notes (Signed)
Subjective: 2 Days Post-Op Procedure(s) (LRB): TOTAL KNEE ARTHROPLASTY (Left) Patient reports pain as mild.   Patient is well, and has had no acute complaints or problems Plan is to go Skilled nursing facility after hospital stay. Negative for chest pain and shortness of breath Fever: no Gastrointestinal:Positive for nausea and vomiting on POD1, has resolved while taking dilaudid for pain.  Objective: Vital signs in last 24 hours: Temp:  [98.2 F (36.8 C)-99.9 F (37.7 C)] 98.2 F (36.8 C) (05/24 0726) Pulse Rate:  [60-88] 84 (05/24 0726) Resp:  [16-20] 16 (05/24 0726) BP: (144-163)/(71-96) 158/80 (05/24 0726) SpO2:  [91 %-96 %] 95 % (05/24 0726)  Intake/Output from previous day:  Intake/Output Summary (Last 24 hours) at 01/30/17 0730 Last data filed at 01/30/17 0522  Gross per 24 hour  Intake             2460 ml  Output             1050 ml  Net             1410 ml    Intake/Output this shift: No intake/output data recorded.  Labs:  Recent Labs  01/29/17 0359 01/30/17 0435  HGB 11.9* 11.3*    Recent Labs  01/29/17 0359 01/30/17 0435  WBC 9.0 10.9  RBC 3.63* 3.47*  HCT 34.3* 32.5*  PLT 216 208    Recent Labs  01/29/17 0359 01/30/17 0435  NA 139 135  K 3.2* 3.7  CL 105 101  CO2 26 28  BUN 16 11  CREATININE 0.74 0.68  GLUCOSE 120* 140*  CALCIUM 8.2* 8.6*   No results for input(s): LABPT, INR in the last 72 hours.   EXAM General - Patient is Alert, Appropriate and Oriented Extremity - ABD soft Sensation intact distally Intact pulses distally Dorsiflexion/Plantar flexion intact Incision: scant drainage No cellulitis present Dressing/Incision - blood tinged drainage Motor Function - intact, moving foot and toes well on exam.  Abdomen soft this AM without tympany.  Normal BS.  Past Medical History:  Diagnosis Date  . Anxiety   . Diabetes mellitus   . Fibromyalgia   . GERD (gastroesophageal reflux disease)   . Heart murmur   . Herniated  disc   . Hyperlipidemia   . Hypertension   . Lack of bladder control   . Lung tumor   . Rheumatoid arthritis ([place])   . Schizoaffective disorder ([place])   . 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 last seizure 2013!  [name] Sleep apnea     Assessment/Plan: 2 Days Post-Op Procedure(s) (LRB): TOTAL KNEE ARTHROPLASTY (Left) Active Problems:   Status post total knee replacement using cement, left  Estimated body mass index is 47.38 kg/m as calculated from the following:   Height as of this encounter: 5\' 4"  (1.626 m).   Weight as of this encounter: 125.2 kg (276 lb). Advance diet Up with therapy  Labs reviewed, Hypokalemia resolved. Up with therapy today, currently recommending SNF. Will begin working on having a BM.  Move on to FLEET enema today. Plan will be for discharge home today following having a BM.  DVT Prophylaxis - Lovenox, Foot Pumps and TED hose Weight-Bearing as tolerated to left leg  J. Cameron Proud, PA-C [facility] 01/30/2017, 7:30 AM

## 2017-01-30 NOTE — Progress Notes (Signed)
Patient is medically stable for D/C to [facility] today. Per [facility] liaison patient can come today to room 809. RN will call report to RN Yaakov Guthrie at [phone] and arrange EMS for transport. Clinical Education officer, museum (CSW) sent D/C orders to [facility] today via HUB. Patient is aware of above. CSW attempted to contact patient's sister [name] however the phone number was disconnected. Patient reported that she made her son [name] aware of her D/C today. Please reconsult if future social work needs arise. CSW signing off.   [employer], LCSW [phone]

## 2017-01-31 NOTE — Telephone Encounter (Signed)
ERRENOUS °

## 2017-02-02 DIAGNOSIS — G8918 Other acute postprocedural pain: Secondary | ICD-10-CM | POA: Diagnosis not present

## 2017-02-02 DIAGNOSIS — E119 Type 2 diabetes mellitus without complications: Secondary | ICD-10-CM | POA: Diagnosis not present

## 2017-02-02 DIAGNOSIS — Z96652 Presence of left artificial knee joint: Secondary | ICD-10-CM | POA: Diagnosis not present

## 2017-02-06 ENCOUNTER — Other Ambulatory Visit: Payer: Self-pay | Admitting: *Deleted

## 2017-02-06 NOTE — Patient Outreach (Signed)
[facility]) Care Management  02/06/2017  TENEKA MALMBERG 1950-11-22 [id number]   Met with patient at bedside of facility.  Patient reports she lives with her son. She had recent TKR. She also has HTN, DM.  She states her AIC is around 7 and she was just started on oral medication.   Patient states she has good support, no transportation or medication needs.   RNCM reviewed [facility] care management services and left brochure and magnet for patient for future needs.   Plan to sign off as no [name] care management needs assessed at this time. Royetta Crochet. Laymond Purser, RN, BSN, [place] [phone]) Business Cell  [phone]) Toll Free Office

## 2017-02-07 DIAGNOSIS — Z96642 Presence of left artificial hip joint: Secondary | ICD-10-CM | POA: Diagnosis not present

## 2017-02-10 DIAGNOSIS — K219 Gastro-esophageal reflux disease without esophagitis: Secondary | ICD-10-CM | POA: Diagnosis not present

## 2017-02-10 DIAGNOSIS — M1990 Unspecified osteoarthritis, unspecified site: Secondary | ICD-10-CM | POA: Diagnosis not present

## 2017-02-10 DIAGNOSIS — G4733 Obstructive sleep apnea (adult) (pediatric): Secondary | ICD-10-CM | POA: Diagnosis not present

## 2017-02-10 DIAGNOSIS — E119 Type 2 diabetes mellitus without complications: Secondary | ICD-10-CM | POA: Diagnosis not present

## 2017-02-10 DIAGNOSIS — M797 Fibromyalgia: Secondary | ICD-10-CM | POA: Diagnosis not present

## 2017-02-10 DIAGNOSIS — Z7984 Long term (current) use of oral hypoglycemic drugs: Secondary | ICD-10-CM | POA: Diagnosis not present

## 2017-02-10 DIAGNOSIS — Z471 Aftercare following joint replacement surgery: Secondary | ICD-10-CM | POA: Diagnosis not present

## 2017-02-10 DIAGNOSIS — Z9181 History of falling: Secondary | ICD-10-CM | POA: Diagnosis not present

## 2017-02-10 DIAGNOSIS — Z7982 Long term (current) use of aspirin: Secondary | ICD-10-CM | POA: Diagnosis not present

## 2017-02-10 DIAGNOSIS — Z96652 Presence of left artificial knee joint: Secondary | ICD-10-CM | POA: Diagnosis not present

## 2017-02-10 DIAGNOSIS — E784 Other hyperlipidemia: Secondary | ICD-10-CM | POA: Diagnosis not present

## 2017-02-10 DIAGNOSIS — M069 Rheumatoid arthritis, unspecified: Secondary | ICD-10-CM | POA: Diagnosis not present

## 2017-02-10 DIAGNOSIS — I10 Essential (primary) hypertension: Secondary | ICD-10-CM | POA: Diagnosis not present

## 2017-02-11 DIAGNOSIS — M1990 Unspecified osteoarthritis, unspecified site: Secondary | ICD-10-CM | POA: Diagnosis not present

## 2017-02-11 DIAGNOSIS — Z7982 Long term (current) use of aspirin: Secondary | ICD-10-CM | POA: Diagnosis not present

## 2017-02-11 DIAGNOSIS — Z9181 History of falling: Secondary | ICD-10-CM | POA: Diagnosis not present

## 2017-02-11 DIAGNOSIS — I10 Essential (primary) hypertension: Secondary | ICD-10-CM | POA: Diagnosis not present

## 2017-02-11 DIAGNOSIS — G4733 Obstructive sleep apnea (adult) (pediatric): Secondary | ICD-10-CM | POA: Diagnosis not present

## 2017-02-11 DIAGNOSIS — K219 Gastro-esophageal reflux disease without esophagitis: Secondary | ICD-10-CM | POA: Diagnosis not present

## 2017-02-11 DIAGNOSIS — Z471 Aftercare following joint replacement surgery: Secondary | ICD-10-CM | POA: Diagnosis not present

## 2017-02-11 DIAGNOSIS — Z96652 Presence of left artificial knee joint: Secondary | ICD-10-CM | POA: Diagnosis not present

## 2017-02-11 DIAGNOSIS — E784 Other hyperlipidemia: Secondary | ICD-10-CM | POA: Diagnosis not present

## 2017-02-11 DIAGNOSIS — M797 Fibromyalgia: Secondary | ICD-10-CM | POA: Diagnosis not present

## 2017-02-11 DIAGNOSIS — Z7984 Long term (current) use of oral hypoglycemic drugs: Secondary | ICD-10-CM | POA: Diagnosis not present

## 2017-02-11 DIAGNOSIS — E119 Type 2 diabetes mellitus without complications: Secondary | ICD-10-CM | POA: Diagnosis not present

## 2017-02-11 DIAGNOSIS — M069 Rheumatoid arthritis, unspecified: Secondary | ICD-10-CM | POA: Diagnosis not present

## 2017-02-13 DIAGNOSIS — Z471 Aftercare following joint replacement surgery: Secondary | ICD-10-CM | POA: Diagnosis not present

## 2017-02-13 DIAGNOSIS — G4733 Obstructive sleep apnea (adult) (pediatric): Secondary | ICD-10-CM | POA: Diagnosis not present

## 2017-02-13 DIAGNOSIS — M797 Fibromyalgia: Secondary | ICD-10-CM | POA: Diagnosis not present

## 2017-02-13 DIAGNOSIS — K219 Gastro-esophageal reflux disease without esophagitis: Secondary | ICD-10-CM | POA: Diagnosis not present

## 2017-02-13 DIAGNOSIS — Z96652 Presence of left artificial knee joint: Secondary | ICD-10-CM | POA: Diagnosis not present

## 2017-02-13 DIAGNOSIS — M069 Rheumatoid arthritis, unspecified: Secondary | ICD-10-CM | POA: Diagnosis not present

## 2017-02-13 DIAGNOSIS — Z7984 Long term (current) use of oral hypoglycemic drugs: Secondary | ICD-10-CM | POA: Diagnosis not present

## 2017-02-13 DIAGNOSIS — I10 Essential (primary) hypertension: Secondary | ICD-10-CM | POA: Diagnosis not present

## 2017-02-13 DIAGNOSIS — Z9181 History of falling: Secondary | ICD-10-CM | POA: Diagnosis not present

## 2017-02-13 DIAGNOSIS — M1990 Unspecified osteoarthritis, unspecified site: Secondary | ICD-10-CM | POA: Diagnosis not present

## 2017-02-13 DIAGNOSIS — E784 Other hyperlipidemia: Secondary | ICD-10-CM | POA: Diagnosis not present

## 2017-02-13 DIAGNOSIS — E119 Type 2 diabetes mellitus without complications: Secondary | ICD-10-CM | POA: Diagnosis not present

## 2017-02-13 DIAGNOSIS — Z7982 Long term (current) use of aspirin: Secondary | ICD-10-CM | POA: Diagnosis not present

## 2017-02-14 ENCOUNTER — Encounter: Payer: Self-pay | Admitting: Family Medicine

## 2017-02-17 DIAGNOSIS — G4733 Obstructive sleep apnea (adult) (pediatric): Secondary | ICD-10-CM | POA: Diagnosis not present

## 2017-02-18 ENCOUNTER — Encounter: Payer: Self-pay | Admitting: Family Medicine

## 2017-02-18 ENCOUNTER — Ambulatory Visit (INDEPENDENT_AMBULATORY_CARE_PROVIDER_SITE_OTHER): Payer: Medicare Other | Admitting: Family Medicine

## 2017-02-18 VITALS — BP 114/64 | HR 132 | Temp 98.1°F | Resp 18 | Ht 66.0 in | Wt 259.3 lb

## 2017-02-18 DIAGNOSIS — Z7984 Long term (current) use of oral hypoglycemic drugs: Secondary | ICD-10-CM | POA: Diagnosis not present

## 2017-02-18 DIAGNOSIS — M797 Fibromyalgia: Secondary | ICD-10-CM | POA: Diagnosis not present

## 2017-02-18 DIAGNOSIS — E119 Type 2 diabetes mellitus without complications: Secondary | ICD-10-CM | POA: Diagnosis not present

## 2017-02-18 DIAGNOSIS — Z471 Aftercare following joint replacement surgery: Secondary | ICD-10-CM | POA: Diagnosis not present

## 2017-02-18 DIAGNOSIS — I10 Essential (primary) hypertension: Secondary | ICD-10-CM | POA: Diagnosis not present

## 2017-02-18 DIAGNOSIS — Z96652 Presence of left artificial knee joint: Secondary | ICD-10-CM | POA: Diagnosis not present

## 2017-02-18 DIAGNOSIS — Z7982 Long term (current) use of aspirin: Secondary | ICD-10-CM | POA: Diagnosis not present

## 2017-02-18 DIAGNOSIS — M1990 Unspecified osteoarthritis, unspecified site: Secondary | ICD-10-CM | POA: Diagnosis not present

## 2017-02-18 DIAGNOSIS — G4733 Obstructive sleep apnea (adult) (pediatric): Secondary | ICD-10-CM | POA: Diagnosis not present

## 2017-02-18 DIAGNOSIS — K219 Gastro-esophageal reflux disease without esophagitis: Secondary | ICD-10-CM | POA: Diagnosis not present

## 2017-02-18 DIAGNOSIS — Z9181 History of falling: Secondary | ICD-10-CM | POA: Diagnosis not present

## 2017-02-18 DIAGNOSIS — E784 Other hyperlipidemia: Secondary | ICD-10-CM | POA: Diagnosis not present

## 2017-02-18 DIAGNOSIS — M069 Rheumatoid arthritis, unspecified: Secondary | ICD-10-CM | POA: Diagnosis not present

## 2017-02-18 NOTE — Progress Notes (Signed)
Name: Lauren [name]   MRN: [id number]    DOB: 10/17/1950   Date:02/18/2017       Progress Note  Subjective  Chief Complaint  Chief Complaint  Patient presents with  . Follow-up    patient is here for a 1 month f/u after her left total knee replacement    HPI  Pt. Presents for follow up after having left total knee replacement on May 22, then went to Rehab at [facility]. She feels well, pain is improved but stiffness is still there. She is getting regular home exercises and taking Dilaudid as needed for pain relief. She also has a therapist helping her with activity. She has finished Lovenox for DVT prophylaxis.  SHe is eating well, normal urine and BM (has a bedside commode).  Past Medical History:  Diagnosis Date  . Anxiety   . Diabetes mellitus   . Fibromyalgia   . GERD (gastroesophageal reflux disease)   . Heart murmur   . Herniated disc   . Hyperlipidemia   . Hypertension   . Lack of bladder control   . Lung tumor   . Rheumatoid arthritis ([place])   . Schizoaffective disorder ([place])   . 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 last seizure 2013!  [name] Sleep apnea     Past Surgical History:  Procedure Laterality Date  . BRAIN TUMOR EXCISION  2012   benign  . CARDIAC CATHETERIZATION  2008  . CESAREAN SECTION    . CYST REMOVAL HAND    . LUNG LOBECTOMY  1977   benign tumor  . TOTAL KNEE ARTHROPLASTY Left 01/28/2017   Procedure: TOTAL KNEE ARTHROPLASTY;  Surgeon: Corky Mull, MD;  Location: [facility] ORS;  Service: Orthopedics;  Laterality: Left;    Family History  Problem Relation Age of Onset  . Heart disease Brother   . Depression Mother   . Heart attack Mother   . Stroke Mother   . Alcohol abuse Father   . Stroke Father   . Diabetes Sister   . Diabetes Sister   . Stomach cancer Sister   . Kidney disease Sister   . COPD Brother   . Lung cancer Brother   . Diabetes Brother     Social History   Social History  . Marital status: Single    Spouse  name: N/A  . Number of children: N/A  . Years of education: N/A   Occupational History  . Not on file.   Social History Main Topics  . Smoking status: Never Smoker  . Smokeless tobacco: Never Used  . Alcohol use No  . Drug use: No  . Sexual activity: No   Other Topics Concern  . Not on file   Social History Narrative  . No narrative on file     Current Outpatient Prescriptions:  .  aspirin EC 81 MG tablet, Take 81 mg by mouth daily before breakfast. , Disp: , Rfl:  .  atenolol (TENORMIN) 100 MG tablet, Take 1 tablet (100 mg total) by mouth daily. (Patient taking differently: Take 100 mg by mouth daily before breakfast. ), Disp: 90 tablet, Rfl: 0 .  Bioflavonoid Products (VITAMIN C) CHEW, Chew 1 tablet by mouth daily as needed (for immune system support)., Disp: , Rfl:  .  Biotin w/ Vitamins C & E (HAIR/SKIN/NAILS PO), Take 1 tablet by mouth daily before breakfast., Disp: , Rfl:  .  Capsaicin (ZOSTRIX EX), Apply 1 application topically 4 (four) times daily as  needed (for knee pain.)., Disp: , Rfl:  .  diphenhydramine-acetaminophen (TYLENOL PM) 25-500 MG TABS tablet, Take 1 tablet by mouth at bedtime as needed (for sleep.)., Disp: , Rfl:  .  enoxaparin (LOVENOX) 40 MG/0.4ML injection, Inject 0.4 mLs (40 mg total) into the skin daily., Disp: 14 Syringe, Rfl: 0 .  haloperidol (HALDOL) 2 MG tablet, Take 3 tablets (6 mg total) by mouth at bedtime., Disp: 90 tablet, Rfl: 3 .  HYDROmorphone (DILAUDID) 4 MG tablet, Take 0.5-1 tablets (2-4 mg total) by mouth every 4 (four) hours as needed for severe pain., Disp: 42 tablet, Rfl: 0 .  ibuprofen (ADVIL,MOTRIN) 200 MG tablet, Take 600 mg by mouth every 8 (eight) hours as needed (for knee pain.)., Disp: , Rfl:  .  lisinopril-hydrochlorothiazide (PRINZIDE,ZESTORETIC) 20-25 MG tablet, Take 1 tablet by mouth daily. (Patient taking differently: Take 1 tablet by mouth daily before breakfast. ), Disp: 90 tablet, Rfl: 0 .  Lotion Base LOTN, Apply 1  application topically 4 (four) times daily as needed (for knee pain.). Two [employer] Essential Lotion (Aloe Vera, Almond Oil, and 6 natural anti-inflammatory essential oils; [place], [place], [place], [place], Peppermint and [place]), Disp: , Rfl:  .  metFORMIN (GLUCOPHAGE) 500 MG tablet, TAKE 1 TABLET BY MOUTH TWICE DAILY WITH A MEAL, Disp: 60 tablet, Rfl: 0 .  Multiple Vitamin (MULTIVITAMIN WITH MINERALS) TABS tablet, Take 1 tablet by mouth daily before breakfast. Women's 50+, Disp: , Rfl:  .  omeprazole (PRILOSEC) 20 MG capsule, Take 1 capsule (20 mg total) by mouth daily. (Patient taking differently: Take 20 mg by mouth daily before breakfast. ), Disp: 90 capsule, Rfl: 0 .  rosuvastatin (CRESTOR) 40 MG tablet, Take 1 tablet (40 mg total) by mouth daily. (Patient taking differently: Take 40 mg by mouth at bedtime. ), Disp: 30 tablet, Rfl: 0 .  sertraline (ZOLOFT) 50 MG tablet, Take 1 tablet (50 mg total) by mouth daily. (Patient taking differently: Take 50 mg by mouth daily before breakfast. ), Disp: 90 tablet, Rfl: 1 .  traMADol (ULTRAM) 50 MG tablet, Take 1-2 tablets (50-100 mg total) by mouth every 6 (six) hours as needed., Disp: 40 tablet, Rfl: 0  Allergies  Allergen Reactions  . Percocet [Oxycodone-Acetaminophen] Diarrhea, Nausea And Vomiting and Nausea Only  . Tramadol Hcl Diarrhea, Nausea And Vomiting and Nausea Only  . Vicodin [Hydrocodone-Acetaminophen] Diarrhea, Nausea And Vomiting and Nausea Only     ROS  Please see history of present illness for complete discussion of ROS  Objective  Vitals:   02/18/17 1001  BP: 114/64  Pulse: (!) 132  Resp: 18  Temp: 98.1 F (36.7 C)  TempSrc: Oral  SpO2: 98%  Weight: 259 lb 4.8 oz (117.6 kg)  Height: 5\' 6"  (1.676 m)    Physical Exam  Constitutional: She is oriented to person, place, and time and well-developed, well-nourished, and in no distress.  HENT:  Head: Normocephalic and atraumatic.    Cardiovascular: Normal rate and regular rhythm.   Pulmonary/Chest: Effort normal and breath sounds normal. No respiratory distress. She has no wheezes.  Abdominal: Soft. There is no tenderness.  Musculoskeletal:       Left knee: She exhibits swelling and erythema. Tenderness found.       Legs: Left knee surgical scar, mild tenderness over the lateral side of patella, normal ROM, no visible drainage  Neurological: She is alert and oriented to person, place, and time.  Psychiatric: Memory and judgment normal. Her mood appears anxious.  She has a flat affect.  Nursing note and vitals reviewed.     Assessment & Plan  1. S/P TKR (total knee replacement) using cement, left Notes reviewed, patient being followed by orthopedics, finished anticoagulation. Advised to continue with home physical therapy.   Toryn Mcclinton [facility] 02/18/2017 10:21 AM

## 2017-02-19 DIAGNOSIS — Z7982 Long term (current) use of aspirin: Secondary | ICD-10-CM | POA: Diagnosis not present

## 2017-02-19 DIAGNOSIS — Z96652 Presence of left artificial knee joint: Secondary | ICD-10-CM | POA: Diagnosis not present

## 2017-02-19 DIAGNOSIS — Z7984 Long term (current) use of oral hypoglycemic drugs: Secondary | ICD-10-CM | POA: Diagnosis not present

## 2017-02-19 DIAGNOSIS — K219 Gastro-esophageal reflux disease without esophagitis: Secondary | ICD-10-CM | POA: Diagnosis not present

## 2017-02-19 DIAGNOSIS — E784 Other hyperlipidemia: Secondary | ICD-10-CM | POA: Diagnosis not present

## 2017-02-19 DIAGNOSIS — M797 Fibromyalgia: Secondary | ICD-10-CM | POA: Diagnosis not present

## 2017-02-19 DIAGNOSIS — E119 Type 2 diabetes mellitus without complications: Secondary | ICD-10-CM | POA: Diagnosis not present

## 2017-02-19 DIAGNOSIS — I10 Essential (primary) hypertension: Secondary | ICD-10-CM | POA: Diagnosis not present

## 2017-02-19 DIAGNOSIS — G4733 Obstructive sleep apnea (adult) (pediatric): Secondary | ICD-10-CM | POA: Diagnosis not present

## 2017-02-19 DIAGNOSIS — Z471 Aftercare following joint replacement surgery: Secondary | ICD-10-CM | POA: Diagnosis not present

## 2017-02-19 DIAGNOSIS — M069 Rheumatoid arthritis, unspecified: Secondary | ICD-10-CM | POA: Diagnosis not present

## 2017-02-19 DIAGNOSIS — Z9181 History of falling: Secondary | ICD-10-CM | POA: Diagnosis not present

## 2017-02-19 DIAGNOSIS — M1990 Unspecified osteoarthritis, unspecified site: Secondary | ICD-10-CM | POA: Diagnosis not present

## 2017-02-20 DIAGNOSIS — K219 Gastro-esophageal reflux disease without esophagitis: Secondary | ICD-10-CM | POA: Diagnosis not present

## 2017-02-20 DIAGNOSIS — G4733 Obstructive sleep apnea (adult) (pediatric): Secondary | ICD-10-CM | POA: Diagnosis not present

## 2017-02-20 DIAGNOSIS — M1990 Unspecified osteoarthritis, unspecified site: Secondary | ICD-10-CM | POA: Diagnosis not present

## 2017-02-20 DIAGNOSIS — E119 Type 2 diabetes mellitus without complications: Secondary | ICD-10-CM | POA: Diagnosis not present

## 2017-02-20 DIAGNOSIS — Z471 Aftercare following joint replacement surgery: Secondary | ICD-10-CM | POA: Diagnosis not present

## 2017-02-20 DIAGNOSIS — I10 Essential (primary) hypertension: Secondary | ICD-10-CM | POA: Diagnosis not present

## 2017-02-20 DIAGNOSIS — Z9181 History of falling: Secondary | ICD-10-CM | POA: Diagnosis not present

## 2017-02-20 DIAGNOSIS — E784 Other hyperlipidemia: Secondary | ICD-10-CM | POA: Diagnosis not present

## 2017-02-20 DIAGNOSIS — Z7984 Long term (current) use of oral hypoglycemic drugs: Secondary | ICD-10-CM | POA: Diagnosis not present

## 2017-02-20 DIAGNOSIS — Z7982 Long term (current) use of aspirin: Secondary | ICD-10-CM | POA: Diagnosis not present

## 2017-02-20 DIAGNOSIS — Z96652 Presence of left artificial knee joint: Secondary | ICD-10-CM | POA: Diagnosis not present

## 2017-02-20 DIAGNOSIS — M069 Rheumatoid arthritis, unspecified: Secondary | ICD-10-CM | POA: Diagnosis not present

## 2017-02-20 DIAGNOSIS — M797 Fibromyalgia: Secondary | ICD-10-CM | POA: Diagnosis not present

## 2017-02-25 ENCOUNTER — Other Ambulatory Visit: Payer: Self-pay | Admitting: Family Medicine

## 2017-02-25 DIAGNOSIS — M069 Rheumatoid arthritis, unspecified: Secondary | ICD-10-CM | POA: Diagnosis not present

## 2017-02-25 DIAGNOSIS — Z96652 Presence of left artificial knee joint: Secondary | ICD-10-CM | POA: Diagnosis not present

## 2017-02-25 DIAGNOSIS — Z471 Aftercare following joint replacement surgery: Secondary | ICD-10-CM | POA: Diagnosis not present

## 2017-02-25 DIAGNOSIS — K219 Gastro-esophageal reflux disease without esophagitis: Secondary | ICD-10-CM | POA: Diagnosis not present

## 2017-02-25 DIAGNOSIS — Z7982 Long term (current) use of aspirin: Secondary | ICD-10-CM | POA: Diagnosis not present

## 2017-02-25 DIAGNOSIS — E7800 Pure hypercholesterolemia, unspecified: Secondary | ICD-10-CM

## 2017-02-25 DIAGNOSIS — I10 Essential (primary) hypertension: Secondary | ICD-10-CM | POA: Diagnosis not present

## 2017-02-25 DIAGNOSIS — G4733 Obstructive sleep apnea (adult) (pediatric): Secondary | ICD-10-CM | POA: Diagnosis not present

## 2017-02-25 DIAGNOSIS — M797 Fibromyalgia: Secondary | ICD-10-CM | POA: Diagnosis not present

## 2017-02-25 DIAGNOSIS — M1990 Unspecified osteoarthritis, unspecified site: Secondary | ICD-10-CM | POA: Diagnosis not present

## 2017-02-25 DIAGNOSIS — Z7984 Long term (current) use of oral hypoglycemic drugs: Secondary | ICD-10-CM | POA: Diagnosis not present

## 2017-02-25 DIAGNOSIS — E784 Other hyperlipidemia: Secondary | ICD-10-CM | POA: Diagnosis not present

## 2017-02-25 DIAGNOSIS — Z9181 History of falling: Secondary | ICD-10-CM | POA: Diagnosis not present

## 2017-02-25 DIAGNOSIS — E119 Type 2 diabetes mellitus without complications: Secondary | ICD-10-CM | POA: Diagnosis not present

## 2017-02-26 ENCOUNTER — Ambulatory Visit: Payer: 59 | Admitting: Psychiatry

## 2017-02-26 DIAGNOSIS — Z471 Aftercare following joint replacement surgery: Secondary | ICD-10-CM | POA: Diagnosis not present

## 2017-02-26 DIAGNOSIS — Z7982 Long term (current) use of aspirin: Secondary | ICD-10-CM | POA: Diagnosis not present

## 2017-02-26 DIAGNOSIS — Z96652 Presence of left artificial knee joint: Secondary | ICD-10-CM | POA: Diagnosis not present

## 2017-02-26 DIAGNOSIS — Z9181 History of falling: Secondary | ICD-10-CM | POA: Diagnosis not present

## 2017-02-26 DIAGNOSIS — E119 Type 2 diabetes mellitus without complications: Secondary | ICD-10-CM | POA: Diagnosis not present

## 2017-02-26 DIAGNOSIS — I10 Essential (primary) hypertension: Secondary | ICD-10-CM | POA: Diagnosis not present

## 2017-02-26 DIAGNOSIS — E784 Other hyperlipidemia: Secondary | ICD-10-CM | POA: Diagnosis not present

## 2017-02-26 DIAGNOSIS — M1990 Unspecified osteoarthritis, unspecified site: Secondary | ICD-10-CM | POA: Diagnosis not present

## 2017-02-26 DIAGNOSIS — M069 Rheumatoid arthritis, unspecified: Secondary | ICD-10-CM | POA: Diagnosis not present

## 2017-02-26 DIAGNOSIS — K219 Gastro-esophageal reflux disease without esophagitis: Secondary | ICD-10-CM | POA: Diagnosis not present

## 2017-02-26 DIAGNOSIS — M797 Fibromyalgia: Secondary | ICD-10-CM | POA: Diagnosis not present

## 2017-02-26 DIAGNOSIS — Z7984 Long term (current) use of oral hypoglycemic drugs: Secondary | ICD-10-CM | POA: Diagnosis not present

## 2017-02-26 DIAGNOSIS — G4733 Obstructive sleep apnea (adult) (pediatric): Secondary | ICD-10-CM | POA: Diagnosis not present

## 2017-02-27 DIAGNOSIS — K219 Gastro-esophageal reflux disease without esophagitis: Secondary | ICD-10-CM | POA: Diagnosis not present

## 2017-02-27 DIAGNOSIS — Z9181 History of falling: Secondary | ICD-10-CM | POA: Diagnosis not present

## 2017-02-27 DIAGNOSIS — E784 Other hyperlipidemia: Secondary | ICD-10-CM | POA: Diagnosis not present

## 2017-02-27 DIAGNOSIS — M797 Fibromyalgia: Secondary | ICD-10-CM | POA: Diagnosis not present

## 2017-02-27 DIAGNOSIS — M1990 Unspecified osteoarthritis, unspecified site: Secondary | ICD-10-CM | POA: Diagnosis not present

## 2017-02-27 DIAGNOSIS — Z96652 Presence of left artificial knee joint: Secondary | ICD-10-CM | POA: Diagnosis not present

## 2017-02-27 DIAGNOSIS — Z7982 Long term (current) use of aspirin: Secondary | ICD-10-CM | POA: Diagnosis not present

## 2017-02-27 DIAGNOSIS — E119 Type 2 diabetes mellitus without complications: Secondary | ICD-10-CM | POA: Diagnosis not present

## 2017-02-27 DIAGNOSIS — I10 Essential (primary) hypertension: Secondary | ICD-10-CM | POA: Diagnosis not present

## 2017-02-27 DIAGNOSIS — G4733 Obstructive sleep apnea (adult) (pediatric): Secondary | ICD-10-CM | POA: Diagnosis not present

## 2017-02-27 DIAGNOSIS — M069 Rheumatoid arthritis, unspecified: Secondary | ICD-10-CM | POA: Diagnosis not present

## 2017-02-27 DIAGNOSIS — Z7984 Long term (current) use of oral hypoglycemic drugs: Secondary | ICD-10-CM | POA: Diagnosis not present

## 2017-02-27 DIAGNOSIS — Z471 Aftercare following joint replacement surgery: Secondary | ICD-10-CM | POA: Diagnosis not present

## 2017-03-03 DIAGNOSIS — Z96652 Presence of left artificial knee joint: Secondary | ICD-10-CM | POA: Diagnosis not present

## 2017-03-05 DIAGNOSIS — G4733 Obstructive sleep apnea (adult) (pediatric): Secondary | ICD-10-CM | POA: Diagnosis not present

## 2017-03-05 DIAGNOSIS — I10 Essential (primary) hypertension: Secondary | ICD-10-CM | POA: Diagnosis not present

## 2017-03-05 DIAGNOSIS — K219 Gastro-esophageal reflux disease without esophagitis: Secondary | ICD-10-CM | POA: Diagnosis not present

## 2017-03-05 DIAGNOSIS — E784 Other hyperlipidemia: Secondary | ICD-10-CM | POA: Diagnosis not present

## 2017-03-05 DIAGNOSIS — M797 Fibromyalgia: Secondary | ICD-10-CM | POA: Diagnosis not present

## 2017-03-05 DIAGNOSIS — Z96652 Presence of left artificial knee joint: Secondary | ICD-10-CM | POA: Diagnosis not present

## 2017-03-05 DIAGNOSIS — M069 Rheumatoid arthritis, unspecified: Secondary | ICD-10-CM | POA: Diagnosis not present

## 2017-03-05 DIAGNOSIS — M1990 Unspecified osteoarthritis, unspecified site: Secondary | ICD-10-CM | POA: Diagnosis not present

## 2017-03-05 DIAGNOSIS — Z7984 Long term (current) use of oral hypoglycemic drugs: Secondary | ICD-10-CM | POA: Diagnosis not present

## 2017-03-05 DIAGNOSIS — Z9181 History of falling: Secondary | ICD-10-CM | POA: Diagnosis not present

## 2017-03-05 DIAGNOSIS — Z7982 Long term (current) use of aspirin: Secondary | ICD-10-CM | POA: Diagnosis not present

## 2017-03-05 DIAGNOSIS — Z471 Aftercare following joint replacement surgery: Secondary | ICD-10-CM | POA: Diagnosis not present

## 2017-03-05 DIAGNOSIS — E119 Type 2 diabetes mellitus without complications: Secondary | ICD-10-CM | POA: Diagnosis not present

## 2017-03-06 ENCOUNTER — Encounter: Payer: Self-pay | Admitting: Family Medicine

## 2017-03-07 DIAGNOSIS — Z9181 History of falling: Secondary | ICD-10-CM | POA: Diagnosis not present

## 2017-03-07 DIAGNOSIS — M1990 Unspecified osteoarthritis, unspecified site: Secondary | ICD-10-CM | POA: Diagnosis not present

## 2017-03-07 DIAGNOSIS — Z96652 Presence of left artificial knee joint: Secondary | ICD-10-CM | POA: Diagnosis not present

## 2017-03-07 DIAGNOSIS — I10 Essential (primary) hypertension: Secondary | ICD-10-CM | POA: Diagnosis not present

## 2017-03-07 DIAGNOSIS — M069 Rheumatoid arthritis, unspecified: Secondary | ICD-10-CM | POA: Diagnosis not present

## 2017-03-07 DIAGNOSIS — E119 Type 2 diabetes mellitus without complications: Secondary | ICD-10-CM | POA: Diagnosis not present

## 2017-03-07 DIAGNOSIS — Z471 Aftercare following joint replacement surgery: Secondary | ICD-10-CM | POA: Diagnosis not present

## 2017-03-07 DIAGNOSIS — Z7984 Long term (current) use of oral hypoglycemic drugs: Secondary | ICD-10-CM | POA: Diagnosis not present

## 2017-03-07 DIAGNOSIS — M797 Fibromyalgia: Secondary | ICD-10-CM | POA: Diagnosis not present

## 2017-03-07 DIAGNOSIS — K219 Gastro-esophageal reflux disease without esophagitis: Secondary | ICD-10-CM | POA: Diagnosis not present

## 2017-03-07 DIAGNOSIS — Z7982 Long term (current) use of aspirin: Secondary | ICD-10-CM | POA: Diagnosis not present

## 2017-03-07 DIAGNOSIS — G4733 Obstructive sleep apnea (adult) (pediatric): Secondary | ICD-10-CM | POA: Diagnosis not present

## 2017-03-07 DIAGNOSIS — E784 Other hyperlipidemia: Secondary | ICD-10-CM | POA: Diagnosis not present

## 2017-03-11 DIAGNOSIS — K219 Gastro-esophageal reflux disease without esophagitis: Secondary | ICD-10-CM | POA: Diagnosis not present

## 2017-03-11 DIAGNOSIS — Z7982 Long term (current) use of aspirin: Secondary | ICD-10-CM | POA: Diagnosis not present

## 2017-03-11 DIAGNOSIS — Z96652 Presence of left artificial knee joint: Secondary | ICD-10-CM | POA: Diagnosis not present

## 2017-03-11 DIAGNOSIS — G4733 Obstructive sleep apnea (adult) (pediatric): Secondary | ICD-10-CM | POA: Diagnosis not present

## 2017-03-11 DIAGNOSIS — M069 Rheumatoid arthritis, unspecified: Secondary | ICD-10-CM | POA: Diagnosis not present

## 2017-03-11 DIAGNOSIS — I10 Essential (primary) hypertension: Secondary | ICD-10-CM | POA: Diagnosis not present

## 2017-03-11 DIAGNOSIS — M797 Fibromyalgia: Secondary | ICD-10-CM | POA: Diagnosis not present

## 2017-03-11 DIAGNOSIS — M1990 Unspecified osteoarthritis, unspecified site: Secondary | ICD-10-CM | POA: Diagnosis not present

## 2017-03-11 DIAGNOSIS — Z9181 History of falling: Secondary | ICD-10-CM | POA: Diagnosis not present

## 2017-03-11 DIAGNOSIS — E119 Type 2 diabetes mellitus without complications: Secondary | ICD-10-CM | POA: Diagnosis not present

## 2017-03-11 DIAGNOSIS — E784 Other hyperlipidemia: Secondary | ICD-10-CM | POA: Diagnosis not present

## 2017-03-11 DIAGNOSIS — Z471 Aftercare following joint replacement surgery: Secondary | ICD-10-CM | POA: Diagnosis not present

## 2017-03-11 DIAGNOSIS — Z7984 Long term (current) use of oral hypoglycemic drugs: Secondary | ICD-10-CM | POA: Diagnosis not present

## 2017-03-14 DIAGNOSIS — K219 Gastro-esophageal reflux disease without esophagitis: Secondary | ICD-10-CM | POA: Diagnosis not present

## 2017-03-14 DIAGNOSIS — Z7982 Long term (current) use of aspirin: Secondary | ICD-10-CM | POA: Diagnosis not present

## 2017-03-14 DIAGNOSIS — G4733 Obstructive sleep apnea (adult) (pediatric): Secondary | ICD-10-CM | POA: Diagnosis not present

## 2017-03-14 DIAGNOSIS — M1990 Unspecified osteoarthritis, unspecified site: Secondary | ICD-10-CM | POA: Diagnosis not present

## 2017-03-14 DIAGNOSIS — E784 Other hyperlipidemia: Secondary | ICD-10-CM | POA: Diagnosis not present

## 2017-03-14 DIAGNOSIS — M069 Rheumatoid arthritis, unspecified: Secondary | ICD-10-CM | POA: Diagnosis not present

## 2017-03-14 DIAGNOSIS — M797 Fibromyalgia: Secondary | ICD-10-CM | POA: Diagnosis not present

## 2017-03-14 DIAGNOSIS — I10 Essential (primary) hypertension: Secondary | ICD-10-CM | POA: Diagnosis not present

## 2017-03-14 DIAGNOSIS — Z7984 Long term (current) use of oral hypoglycemic drugs: Secondary | ICD-10-CM | POA: Diagnosis not present

## 2017-03-14 DIAGNOSIS — Z9181 History of falling: Secondary | ICD-10-CM | POA: Diagnosis not present

## 2017-03-14 DIAGNOSIS — Z471 Aftercare following joint replacement surgery: Secondary | ICD-10-CM | POA: Diagnosis not present

## 2017-03-14 DIAGNOSIS — E119 Type 2 diabetes mellitus without complications: Secondary | ICD-10-CM | POA: Diagnosis not present

## 2017-03-14 DIAGNOSIS — Z96652 Presence of left artificial knee joint: Secondary | ICD-10-CM | POA: Diagnosis not present

## 2017-03-17 ENCOUNTER — Other Ambulatory Visit: Payer: Self-pay | Admitting: Family Medicine

## 2017-03-17 DIAGNOSIS — I10 Essential (primary) hypertension: Secondary | ICD-10-CM

## 2017-03-17 NOTE — Telephone Encounter (Signed)
Last BMP reviewed; Rxs approved for mail order in colleague's absence

## 2017-03-20 ENCOUNTER — Encounter: Payer: Self-pay | Admitting: Family Medicine

## 2017-03-21 ENCOUNTER — Ambulatory Visit: Payer: Self-pay | Admitting: Family Medicine

## 2017-03-25 ENCOUNTER — Ambulatory Visit (INDEPENDENT_AMBULATORY_CARE_PROVIDER_SITE_OTHER): Payer: Medicare Other | Admitting: Family Medicine

## 2017-03-25 ENCOUNTER — Encounter: Payer: Self-pay | Admitting: Family Medicine

## 2017-03-25 VITALS — BP 126/82 | HR 83 | Temp 98.1°F | Resp 16 | Ht 66.0 in | Wt 260.1 lb

## 2017-03-25 DIAGNOSIS — E119 Type 2 diabetes mellitus without complications: Secondary | ICD-10-CM

## 2017-03-25 DIAGNOSIS — E7800 Pure hypercholesterolemia, unspecified: Secondary | ICD-10-CM

## 2017-03-25 DIAGNOSIS — K219 Gastro-esophageal reflux disease without esophagitis: Secondary | ICD-10-CM

## 2017-03-25 DIAGNOSIS — I10 Essential (primary) hypertension: Secondary | ICD-10-CM

## 2017-03-25 LAB — GLUCOSE, POCT (MANUAL RESULT ENTRY): POC Glucose: 112 mg/dl — AB (ref 70–99)

## 2017-03-25 LAB — POCT GLYCOSYLATED HEMOGLOBIN (HGB A1C): Hemoglobin A1C: 6.5

## 2017-03-25 MED ORDER — OMEPRAZOLE 20 MG PO CPDR: 20.0000 mg | DELAYED_RELEASE_CAPSULE | Freq: Every day | ORAL | 0 refills | Status: DC

## 2017-03-25 NOTE — Progress Notes (Signed)
Name: Lauren [name]   MRN: [id number]    DOB: 10/02/1950   Date:03/25/2017       Progress Note  Subjective  Chief Complaint  Chief Complaint  Patient presents with  . Follow-up    1 month recheck    Diabetes  She presents for her follow-up diabetic visit. She has type 2 diabetes mellitus. Her disease course has been stable. There are no hypoglycemic associated symptoms. Pertinent negatives for hypoglycemia include no headaches, nervousness/anxiousness or sweats. Pertinent negatives for diabetes include no blurred vision, no chest pain (occasional chest pain), no fatigue, no polydipsia and no polyuria. Symptoms are stable. Pertinent negatives for diabetic complications include no CVA or heart disease. Current diabetic treatment includes diet and oral agent (monotherapy). She is following a generally unhealthy and diabetic diet. She rarely participates in exercise. She monitors blood glucose at home 1-2 x per week. Her breakfast blood glucose range is generally 110-130 mg/dl. An ACE inhibitor/angiotensin II receptor blocker is being taken. Eye exam is current.  Hypertension  This is a chronic problem. The problem is unchanged. The problem is controlled. Associated symptoms include malaise/fatigue. Pertinent negatives include no blurred vision, chest pain (occasional chest pain), headaches, palpitations (occasional palpitations.), shortness of breath or sweats. Past treatments include ACE inhibitors, diuretics and beta blockers. There is no history of kidney disease, CAD/MI or CVA.  Hyperlipidemia  This is a chronic problem. The problem is controlled. Recent lipid tests were reviewed and are normal. Exacerbating diseases include diabetes and obesity. Pertinent negatives include no chest pain (occasional chest pain), myalgias or shortness of breath. Current antihyperlipidemic treatment includes statins.  Gastroesophageal Reflux  She reports no belching, no chest pain (occasional chest pain), no  coughing, no dysphagia or no heartburn. This is a chronic problem. The problem has been unchanged. Pertinent negatives include no fatigue. She has tried a PPI for the symptoms.     Past Medical History:  Diagnosis Date  . Anxiety   . Diabetes mellitus   . Fibromyalgia   . GERD (gastroesophageal reflux disease)   . Heart murmur   . Herniated disc   . Hyperlipidemia   . Hypertension   . Lack of bladder control   . Lung tumor   . Rheumatoid arthritis ([place])   . Schizoaffective disorder ([place])   . Seizure [facility])    after brain surgery 2012. last seizure 2013!  [name] Sleep apnea     Past Surgical History:  Procedure Laterality Date  . BRAIN TUMOR EXCISION  2012   benign  . CARDIAC CATHETERIZATION  2008  . CESAREAN SECTION    . CYST REMOVAL HAND    . LUNG LOBECTOMY  1977   benign tumor  . TOTAL KNEE ARTHROPLASTY Left 01/28/2017   Procedure: TOTAL KNEE ARTHROPLASTY;  Surgeon: Corky Mull, MD;  Location: [facility] ORS;  Service: Orthopedics;  Laterality: Left;    Family History  Problem Relation Age of Onset  . Heart disease Brother   . Depression Mother   . Heart attack Mother   . Stroke Mother   . Alcohol abuse Father   . Stroke Father   . Diabetes Sister   . Diabetes Sister   . Stomach cancer Sister   . Kidney disease Sister   . COPD Brother   . Lung cancer Brother   . Diabetes Brother     Social History   Social History  . Marital status: Single    Spouse name: N/A  . Number of children:  N/A  . Years of education: N/A   Occupational History  . Not on file.   Social History Main Topics  . Smoking status: Never Smoker  . Smokeless tobacco: Never Used  . Alcohol use No  . Drug use: No  . Sexual activity: No   Other Topics Concern  . Not on file   Social History Narrative  . No narrative on file     Current Outpatient Prescriptions:  .  aspirin EC 81 MG tablet, Take 81 mg by mouth daily before breakfast. , Disp: , Rfl:  .  atenolol (TENORMIN) 100 MG  tablet, TAKE 1 TABLET BY MOUTH  DAILY, Disp: 90 tablet, Rfl: 0 .  Bioflavonoid Products (VITAMIN C) CHEW, Chew 1 tablet by mouth daily as needed (for immune system support)., Disp: , Rfl:  .  Biotin w/ Vitamins C & E (HAIR/SKIN/NAILS PO), Take 1 tablet by mouth daily before breakfast., Disp: , Rfl:  .  Capsaicin (ZOSTRIX EX), Apply 1 application topically 4 (four) times daily as needed (for knee pain.)., Disp: , Rfl:  .  diphenhydramine-acetaminophen (TYLENOL PM) 25-500 MG TABS tablet, Take 1 tablet by mouth at bedtime as needed (for sleep.)., Disp: , Rfl:  .  enoxaparin (LOVENOX) 40 MG/0.4ML injection, Inject 0.4 mLs (40 mg total) into the skin daily., Disp: 14 Syringe, Rfl: 0 .  haloperidol (HALDOL) 2 MG tablet, Take 3 tablets (6 mg total) by mouth at bedtime., Disp: 90 tablet, Rfl: 3 .  HYDROmorphone (DILAUDID) 4 MG tablet, Take 0.5-1 tablets (2-4 mg total) by mouth every 4 (four) hours as needed for severe pain., Disp: 42 tablet, Rfl: 0 .  ibuprofen (ADVIL,MOTRIN) 200 MG tablet, Take 600 mg by mouth every 8 (eight) hours as needed (for knee pain.)., Disp: , Rfl:  .  lisinopril-hydrochlorothiazide (PRINZIDE,ZESTORETIC) 20-25 MG tablet, TAKE 1 TABLET BY MOUTH  DAILY, Disp: 90 tablet, Rfl: 0 .  Lotion Base LOTN, Apply 1 application topically 4 (four) times daily as needed (for knee pain.). Two Old Goats Essential Lotion (Aloe Vera, Almond Oil, and 6 natural anti-inflammatory essential oils; [place], [place], [place], [place], Peppermint and [place]), Disp: , Rfl:  .  metFORMIN (GLUCOPHAGE) 500 MG tablet, TAKE 1 TABLET BY MOUTH TWICE DAILY WITH A MEAL, Disp: 60 tablet, Rfl: 0 .  Multiple Vitamin (MULTIVITAMIN WITH MINERALS) TABS tablet, Take 1 tablet by mouth daily before breakfast. Women's 50+, Disp: , Rfl:  .  omeprazole (PRILOSEC) 20 MG capsule, TAKE 1 CAPSULE BY MOUTH  DAILY, Disp: 90 capsule, Rfl: 0 .  rosuvastatin (CRESTOR) 40 MG tablet, Take 1 tablet (40 mg total) by  mouth daily. (Patient taking differently: Take 40 mg by mouth at bedtime. ), Disp: 30 tablet, Rfl: 0 .  rosuvastatin (CRESTOR) 40 MG tablet, TAKE 1 TABLET BY MOUTH  DAILY, Disp: 90 tablet, Rfl: 0 .  sertraline (ZOLOFT) 50 MG tablet, Take 1 tablet (50 mg total) by mouth daily. (Patient taking differently: Take 50 mg by mouth daily before breakfast. ), Disp: 90 tablet, Rfl: 1 .  traMADol (ULTRAM) 50 MG tablet, Take 1-2 tablets (50-100 mg total) by mouth every 6 (six) hours as needed., Disp: 40 tablet, Rfl: 0  Allergies  Allergen Reactions  . Percocet [Oxycodone-Acetaminophen] Diarrhea, Nausea And Vomiting and Nausea Only  . Tramadol Hcl Diarrhea, Nausea And Vomiting and Nausea Only  . Vicodin [Hydrocodone-Acetaminophen] Diarrhea, Nausea And Vomiting and Nausea Only     Review of Systems  Constitutional: Positive for malaise/fatigue. Negative for  fatigue.  Eyes: Negative for blurred vision.  Respiratory: Negative for cough and shortness of breath.   Cardiovascular: Negative for chest pain (occasional chest pain) and palpitations (occasional palpitations.).  Gastrointestinal: Negative for dysphagia and heartburn.  Musculoskeletal: Negative for myalgias.  Neurological: Negative for headaches.  Endo/Heme/Allergies: Negative for polydipsia.  Psychiatric/Behavioral: The patient is not nervous/anxious.       Objective  Vitals:   03/25/17 1042  BP: 126/82  Pulse: 83  Resp: 16  Temp: 98.1 F (36.7 C)  TempSrc: Oral  SpO2: 94%  Weight: 260 lb 1.6 oz (118 kg)  Height: 5\' 6"  (1.676 m)    Physical Exam  Constitutional: She is oriented to person, place, and time and well-developed, well-nourished, and in no distress.  HENT:  Head: Normocephalic and atraumatic.  Cardiovascular: Normal rate, regular rhythm and normal heart sounds.   No murmur heard. Pulmonary/Chest: Effort normal and breath sounds normal. She has no wheezes.  Abdominal: Soft. Bowel sounds are normal. There is no  tenderness.  Neurological: She is alert and oriented to person, place, and time.  Psychiatric: Mood, memory, affect and judgment normal.  Nursing note and vitals reviewed.   Assessment & Plan  1. Controlled type 2 diabetes mellitus without complication, without long-term current use of insulin (HCC) Point-of-care A1c 6.5%, well-controlled diabetes - POCT HgB A1C - POCT Glucose (CBG)  2. Gastroesophageal reflux disease, esophagitis presence not specified  - omeprazole (PRILOSEC) 20 MG capsule; Take 1 capsule (20 mg total) by mouth daily.  Dispense: 90 capsule; Refill: 0  3. Essential hypertension BP stable on present and hypertensive therapy  4. Pure hypercholesterolemia LDL elevated above goal at 102 MG per DL, continue on high-dose statin and recheck in 3 months   Boluwatife Flight [facility] 03/25/2017 11:03 AM

## 2017-03-31 ENCOUNTER — Encounter: Payer: Self-pay | Admitting: Family Medicine

## 2017-04-07 ENCOUNTER — Other Ambulatory Visit: Payer: Self-pay | Admitting: Psychiatry

## 2017-04-11 ENCOUNTER — Other Ambulatory Visit: Payer: Self-pay | Admitting: Family Medicine

## 2017-04-11 DIAGNOSIS — Z1231 Encounter for screening mammogram for malignant neoplasm of breast: Secondary | ICD-10-CM

## 2017-04-30 ENCOUNTER — Ambulatory Visit
Admission: RE | Admit: 2017-04-30 | Discharge: 2017-04-30 | Disposition: A | Discharge status: Home / Self Care | Payer: Medicare Other | Source: Ambulatory Visit | Attending: Family Medicine | Admitting: Family Medicine

## 2017-04-30 DIAGNOSIS — Z1231 Encounter for screening mammogram for malignant neoplasm of breast: Secondary | ICD-10-CM | POA: Insufficient documentation

## 2017-05-05 ENCOUNTER — Ambulatory Visit: Payer: Self-pay

## 2017-05-08 ENCOUNTER — Telehealth: Payer: Self-pay | Admitting: Psychiatry

## 2017-05-13 ENCOUNTER — Other Ambulatory Visit: Payer: Self-pay | Admitting: Family Medicine

## 2017-05-13 DIAGNOSIS — K219 Gastro-esophageal reflux disease without esophagitis: Secondary | ICD-10-CM

## 2017-05-14 ENCOUNTER — Encounter: Payer: Self-pay | Admitting: Psychiatry

## 2017-05-14 ENCOUNTER — Ambulatory Visit (INDEPENDENT_AMBULATORY_CARE_PROVIDER_SITE_OTHER): Payer: 59 | Admitting: Psychiatry

## 2017-05-14 VITALS — BP 121/72 | HR 76 | Wt 257.2 lb

## 2017-05-14 DIAGNOSIS — F200 Paranoid schizophrenia: Secondary | ICD-10-CM | POA: Diagnosis not present

## 2017-05-14 MED ORDER — HALOPERIDOL 2 MG PO TABS: 4.0000 mg | ORAL_TABLET | Freq: Every day | ORAL | 2 refills | Status: DC

## 2017-05-14 MED ORDER — SERTRALINE HCL 50 MG PO TABS: 50.0000 mg | ORAL_TABLET | Freq: Every day | ORAL | 1 refills | Status: DC

## 2017-05-14 NOTE — Telephone Encounter (Signed)
Ok, maybe she can see Dr.Eappen

## 2017-05-14 NOTE — Progress Notes (Signed)
Patient ID: LEANNDRA [name], female   DOB: 07-Jul-1951, 66 y.o.   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 Patient has not been seen in 6 months. Reports that she had had knee surgery and was to see the other physician but she had to reschedule. Today she reports taking Haldol at 4 mg. States that the 6 mg is making her handshake. She is taking the Zoloft regularly. Reports she continues to have some on and off anxiety but overall doing well. Her son continues to live with her. States that he has schizophrenia and she worries about him what he would do if she were not here. Overall doing okay.  Past Medical History:  Past Medical History:  Diagnosis Date  . Anxiety   . Diabetes mellitus   . Fibromyalgia   . GERD (gastroesophageal reflux disease)   . Heart murmur   . Herniated disc   . Hyperlipidemia   . Hypertension   . Lack of bladder control   . Lung tumor   . Rheumatoid arthritis ([place])   . Schizoaffective disorder ([place])   . Seizure [facility])    after brain surgery 2012. last seizure 2013!  [name] Sleep apnea     Past Surgical History:  Procedure Laterality Date  . BRAIN TUMOR EXCISION  2012   benign  . CARDIAC CATHETERIZATION  2008  . CESAREAN SECTION    . CYST REMOVAL HAND    . LUNG LOBECTOMY  1977   benign tumor  . 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 Heart disease Brother   . Depression Mother   . Heart attack Mother   . Stroke Mother   . Alcohol  abuse Father   . Stroke Father   . Diabetes Sister   . Diabetes Sister   . Stomach cancer Sister   . Kidney disease Sister   . COPD Brother   . Lung cancer Brother   . Diabetes Brother    Social History:   Social History   Social History  . Marital status: Single    Spouse name: N/A  . Number of children: N/A  . Years of education: N/A   Social History Main Topics  . Smoking status: Never Smoker  . Smokeless tobacco: Never Used  . Alcohol use No  . Drug use: No  . Sexual activity: No   Other Topics Concern  . None   Social History Narrative  . None   Additional Social History:   Musculoskeletal: Strength & Muscle Tone: within normal limits Gait & Station: broad based Patient leans: N/A  Psychiatric Specialty Exam: Medication Refill     ROS  Blood pressure 121/72, pulse 76, weight 257 lb 3.2 oz (116.7 kg).Body mass index is 41.51 kg/m.  General Appearance: Casual  Eye Contact:  Fair  Speech:  Stilted but articulate  Volume:  Normal  Mood:  Euthymic  Affect:  Constricted  Thought Process:  Circumstantial  Orientation:  Full (Time, Place, and  Person)  Thought Content:  Paranoid Ideation and Rumination  Suicidal Thoughts:  No  Homicidal Thoughts:  No  Memory:  Immediate;   Fair Recent;   Fair Remote;   Fair  Judgement:  Fair  Insight:  Fair  Psychomotor Activity:  NA  Concentration:  Fair  Recall:  [employer] of Knowledge:Fair  Language: Fair  Akathisia:  No  Handed:  Right  AIMS (if indicated):  Some speech impediment, no cog wheeling, done on 11/29/2016  Assets:  Communication Skills Desire for Improvement Financial Resources/Insurance Housing  ADL's:  Intact  Cognition: WNL  Sleep:  okay   Is the patient at risk to self?  No. Has the patient been a risk to self in the past 6 months?  No. Has the patient been a risk to self within the distant past?  Yes.   Is the patient a risk to others?  No. Has the patient been a risk to others in the  past 6 months?  No. Has the patient been a risk to others within the distant past?  No.  Allergies:   Allergies  Allergen Reactions  . Percocet [Oxycodone-Acetaminophen] Diarrhea, Nausea And Vomiting and Nausea Only  . Tramadol Hcl Diarrhea, Nausea And Vomiting and Nausea Only  . Vicodin [Hydrocodone-Acetaminophen] Diarrhea, Nausea And Vomiting and Nausea Only   Current Medications: Current Outpatient Prescriptions  Medication Sig Dispense Refill  . aspirin EC 81 MG tablet Take 81 mg by mouth daily before breakfast.     . atenolol (TENORMIN) 100 MG tablet TAKE 1 TABLET BY MOUTH  DAILY 90 tablet 0  . Bioflavonoid Products (VITAMIN C) CHEW Chew 1 tablet by mouth daily as needed (for immune system support).    . Biotin w/ Vitamins C & E (HAIR/SKIN/NAILS PO) Take 1 tablet by mouth daily before breakfast.    . Capsaicin (ZOSTRIX EX) Apply 1 application topically 4 (four) times daily as needed (for knee pain.).    [name] diphenhydramine-acetaminophen (TYLENOL PM) 25-500 MG TABS tablet Take 1 tablet by mouth at bedtime as needed (for sleep.).    [name] enoxaparin (LOVENOX) 40 MG/0.4ML injection Inject 0.4 mLs (40 mg total) into the skin daily. 14 Syringe 0  . haloperidol (HALDOL) 2 MG tablet Take 3 tablets (6 mg total) by mouth at bedtime. 90 tablet 3  . HYDROmorphone (DILAUDID) 4 MG tablet Take 0.5-1 tablets (2-4 mg total) by mouth every 4 (four) hours as needed for severe pain. 42 tablet 0  . ibuprofen (ADVIL,MOTRIN) 200 MG tablet Take 600 mg by mouth every 8 (eight) hours as needed (for knee pain.).    [name] lisinopril-hydrochlorothiazide (PRINZIDE,ZESTORETIC) 20-25 MG tablet TAKE 1 TABLET BY MOUTH  DAILY 90 tablet 0  . Lotion Base LOTN Apply 1 application topically 4 (four) times daily as needed (for knee pain.). Two Old Goats Essential Lotion (Aloe Vera, [employer], and 6 natural anti-inflammatory essential oils; [place], [place], [place], [place], Peppermint and [place])    .  metFORMIN (GLUCOPHAGE) 500 MG tablet TAKE 1 TABLET BY MOUTH TWICE DAILY WITH A MEAL 60 tablet 0  . Multiple Vitamin (MULTIVITAMIN WITH MINERALS) TABS tablet Take 1 tablet by mouth daily before breakfast. Women's 50+    . omeprazole (PRILOSEC) 20 MG capsule Take 1 capsule (20 mg total) by mouth daily. 90 capsule 0  . rosuvastatin (CRESTOR) 40 MG tablet TAKE 1 TABLET BY MOUTH  DAILY 90 tablet 0  . sertraline (ZOLOFT) 50 MG tablet Take 1 tablet (50 mg  total) by mouth daily. (Patient taking differently: Take 50 mg by mouth daily before breakfast. ) 90 tablet 1  . traMADol (ULTRAM) 50 MG tablet Take 1-2 tablets (50-100 mg total) by mouth every 6 (six) hours as needed. 40 tablet 0   No current facility-administered medications for this visit.     Previous Psychotropic Medications: Yes   Substance Abuse History in the last 12 months:  No.  Consequences of Substance Abuse: Negative  Medical Decision Making:  Established Problem, Stable/Improving (1), Review of Psycho-Social Stressors (1), Review or order clinical lab tests (1), Review and summation of old records (2) and Review of Medication Regimen & Side Effects (2)  Treatment Plan Summary: Medication management  Paranoid schizophrenia Continue Haldol at 4 mg at bedtime. Patient denies any problems with side effects, aims was negative.  Anxiety Continue Zoloft at  50 mg by mouth daily. Patient aware of benefits and side effects Discussed deep breathing exercises for patient to deal with her stress.  Return to clinic in 3 month's time or call before if necessary.   Arjun Hard 9/5/20189:24 AM

## 2017-05-19 DIAGNOSIS — G4733 Obstructive sleep apnea (adult) (pediatric): Secondary | ICD-10-CM | POA: Diagnosis not present

## 2017-05-26 ENCOUNTER — Ambulatory Visit (INDEPENDENT_AMBULATORY_CARE_PROVIDER_SITE_OTHER): Payer: Medicare Other

## 2017-05-26 VITALS — Ht 66.0 in | Wt 260.0 lb

## 2017-05-26 DIAGNOSIS — Z1159 Encounter for screening for other viral diseases: Secondary | ICD-10-CM

## 2017-05-26 DIAGNOSIS — Z23 Encounter for immunization: Secondary | ICD-10-CM | POA: Diagnosis not present

## 2017-05-26 DIAGNOSIS — Z114 Encounter for screening for human immunodeficiency virus [HIV]: Secondary | ICD-10-CM

## 2017-05-26 DIAGNOSIS — Z0000 Encounter for general adult medical examination without abnormal findings: Secondary | ICD-10-CM

## 2017-05-26 NOTE — Progress Notes (Signed)
Subjective:   Lauren [name] is a 66 y.o. female who presents for an Initial Medicare Annual Wellness Visit.  Review of Systems    N/A  Cardiac Risk Factors include: diabetes mellitus;dyslipidemia;hypertension;obesity (BMI >30kg/m2)     Objective:    Today's Vitals   05/26/17 0852 05/26/17 0904  Weight: 260 lb (117.9 kg)   Height: 5\' 6"  (1.676 m)   PainSc:  4    Body mass index is 41.97 kg/m.   Current Medications (verified) Outpatient Encounter Prescriptions as of 05/26/2017  Medication Sig  . aspirin EC 81 MG tablet Take 81 mg by mouth daily before breakfast.   . atenolol (TENORMIN) 100 MG tablet TAKE 1 TABLET BY MOUTH  DAILY  . Bioflavonoid Products (VITAMIN C) CHEW Chew 1 tablet by mouth daily as needed (for immune system support).  . Capsaicin (ZOSTRIX EX) Apply 1 application topically 4 (four) times daily as needed (for knee pain.).  [name] haloperidol (HALDOL) 2 MG tablet Take 2 tablets (4 mg total) by mouth at bedtime.  [name] ibuprofen (ADVIL,MOTRIN) 200 MG tablet Take 600 mg by mouth every 8 (eight) hours as needed (for knee pain.).  [name] lisinopril-hydrochlorothiazide (PRINZIDE,ZESTORETIC) 20-25 MG tablet TAKE 1 TABLET BY MOUTH  DAILY  . Lotion Base LOTN Apply 1 application topically 4 (four) times daily as needed (for knee pain.). [employer] [place] [employer] Essential Lotion (Aloe Vera, [employer], and 6 natural anti-inflammatory essential oils; [place], [place], [place], [place], Peppermint and [place])  . metFORMIN (GLUCOPHAGE) 500 MG tablet TAKE 1 TABLET BY MOUTH TWICE DAILY WITH A MEAL  . Multiple Vitamin (MULTIVITAMIN WITH MINERALS) TABS tablet Take 1 tablet by mouth daily before breakfast. Women's 50+  . omeprazole (PRILOSEC) 20 MG capsule Take 1 capsule (20 mg total) by mouth daily.  . rosuvastatin (CRESTOR) 40 MG tablet TAKE 1 TABLET BY MOUTH  DAILY  . sertraline (ZOLOFT) 50 MG tablet Take 1 tablet (50 mg total) by mouth daily before breakfast.  .  [DISCONTINUED] Biotin w/ Vitamins C & E (HAIR/SKIN/NAILS PO) Take 1 tablet by mouth daily before breakfast.  . [DISCONTINUED] diphenhydramine-acetaminophen (TYLENOL PM) 25-500 MG TABS tablet Take 1 tablet by mouth at bedtime as needed (for sleep.).  . [DISCONTINUED] enoxaparin (LOVENOX) 40 MG/0.4ML injection Inject 0.4 mLs (40 mg total) into the skin daily.  . [DISCONTINUED] HYDROmorphone (DILAUDID) 4 MG tablet Take 0.5-1 tablets (2-4 mg total) by mouth every 4 (four) hours as needed for severe pain.  . [DISCONTINUED] traMADol (ULTRAM) 50 MG tablet Take 1-2 tablets (50-100 mg total) by mouth every 6 (six) hours as needed.   No facility-administered encounter medications on file as of 05/26/2017.     Allergies (verified) Percocet [oxycodone-acetaminophen]; Tramadol hcl; and Vicodin [hydrocodone-acetaminophen]   History: Past Medical History:  Diagnosis Date  . Anxiety   . Diabetes mellitus   . Fibromyalgia   . GERD (gastroesophageal reflux disease)   . Heart murmur   . Herniated disc   . Hyperlipidemia   . Hypertension   . Lack of bladder control   . Lung tumor   . Rheumatoid arthritis ([place])   . Schizoaffective disorder ([place])   . Seizure [facility])    after brain surgery 2012. last seizure 2013!  [name] Sleep apnea    Past Surgical History:  Procedure Laterality Date  . BRAIN TUMOR EXCISION  2012   benign  . CARDIAC CATHETERIZATION  2008  . CESAREAN SECTION    . CYST REMOVAL HAND    .  LUNG LOBECTOMY  1977   benign tumor  . 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 Heart disease Brother   . Depression Mother   . Heart attack Mother   . Stroke Mother   . Alcohol abuse Father   . Stroke Father   . Diabetes Sister   . Diabetes Sister   . Stomach cancer Sister   . Kidney disease Sister   . COPD Brother     . Lung cancer Brother   . Diabetes Brother    Social History   Occupational History  . Not on file.   Social History Main Topics  . Smoking status: Never Smoker  . Smokeless tobacco: Never Used  . Alcohol use No  . Drug use: No  . Sexual activity: No    Tobacco Counseling Counseling given: Not Answered   Activities of Daily Living In your present state of health, do you have any difficulty performing the following activities: 05/26/2017 02/18/2017  Hearing? Y N  Comment left ear hearing loss -  Vision? N N  Comment - -  Difficulty concentrating or making decisions? N N  Walking or climbing stairs? Y Y  Comment knee pain due to arthritis pain due to (left) knee surgery and right knee pain  Dressing or bathing? N N  Doing errands, shopping? N N  Preparing Food and eating ? N -  Using the Toilet? N -  In the past six months, have you accidently leaked urine? Y -  Comment wears protection -  Do you have problems with loss of bowel control? N -  Managing your Medications? N -  Managing your Finances? N -  Housekeeping or managing your Housekeeping? N -  Some recent data might be hidden    Immunizations and Health Maintenance Immunization History  Administered Date(s) Administered  . Influenza,inj,Quad PF,6+ Mos 06/22/2015, 05/30/2016, 05/26/2017   Health Maintenance Due  Topic Date Due  . OPHTHALMOLOGY EXAM  01/09/1963  . PAP SMEAR  01/08/1974    Patient Care Team: Roselee Nova, MD as PCP - General (Family Medicine) Elvin So, MD as Consulting Physician (Psychiatry)  Indicate any recent Medical Services you may have received from other than [facility] providers in the past year (date may be approximate).     Assessment:   This is a routine wellness examination for Lauren [name].   Hearing/Vision screen Vision Screening Comments: [facility] 2 years.   Dietary issues and exercise activities discussed: Current  Exercise Habits: Home exercise routine, Type of exercise: stretching (knee exercises post rehab), Time (Minutes): 30, Frequency (Times/Week): 2, Weekly Exercise (Minutes/Week): 60, Intensity: Mild, Exercise limited by: orthopedic condition(s)  Goals    . Weight (lb) < 200 lb (90.7 kg)          Recommend weight loss of 60+ pounds. Discussed what a healthy diet consists of.       Depression Screen PHQ 2/9 Scores 05/26/2017 02/18/2017 01/07/2017 12/20/2016 05/30/2016 02/21/2016 10/25/2015  PHQ - 2 Score 0 0 0 0 0 0 0    Fall Risk Fall Risk  05/26/2017 02/18/2017 01/07/2017 12/20/2016 05/30/2016  Falls in the past year? No No No No No    Cognitive Function:     6CIT Screen 05/26/2017  What Year? 0  points  What month? 0 points  What time? 0 points  Count back from 20 0 points  Months in reverse 0 points  Repeat phrase 2 points  Total Score 2    Screening Tests Health Maintenance  Topic Date Due  . OPHTHALMOLOGY EXAM  01/09/1963  . PAP SMEAR  01/08/1974  . FOOT EXAM  05/30/2017  . HEMOGLOBIN A1C  09/25/2017  . MAMMOGRAM  05/01/2019  . TETANUS/TDAP  08/20/2020  . COLONOSCOPY  12/10/2023  . INFLUENZA VACCINE  Completed  . Hepatitis C Screening  Completed  . HIV Screening  Completed      Plan:  I have personally reviewed and addressed the Medicare Annual Wellness questionnaire and have noted the following in the patient's chart:  A. Medical and social history B. Use of alcohol, tobacco or illicit drugs  C. Current medications and supplements D. Functional ability and status E.  Nutritional status F.  Physical activity G. Advance directives H. List of other physicians I.  Hospitalizations, surgeries, and ER visits in previous 12 months J.  [place] such as hearing and vision if needed, cognitive and depression L. Referrals and appointments - none  In addition, I have reviewed and discussed with patient certain preventive protocols, quality metrics, and best practice  recommendations. A written personalized care plan for preventive services as well as general preventive health recommendations were provided to patient.  See attached scanned questionnaire for additional information.   Signed,  Fabio Neighbors, LPN Nurse Health Advisor   MD Recommendations: Pt to schedule an eye exam this year. Pt would like to discuss having a pap smear at next OV.

## 2017-05-26 NOTE — Patient Instructions (Signed)
Lauren [name] , Thank you for taking time to come for your Medicare Wellness Visit. I appreciate your ongoing commitment to your health goals. Please review the following plan we discussed and let me know if I can assist you in the future.   Screening recommendations/referrals: Colonoscopy: up to date Mammogram: up to date Bone Density: declined Recommended yearly ophthalmology/optometry visit for glaucoma screening and checkup Recommended yearly dental visit for hygiene and checkup  Vaccinations: Influenza vaccine: completed today Pneumococcal vaccine: N/A Tdap vaccine: up to date Shingles vaccine: completed per pt (requested copy)  Advanced directives: Advance directive discussed with you today. Even though you declined this today please call our office should you change your mind and we can give you the proper paperwork for you to fill out.  Conditions/risks identified: Obesity; Recommend weight loss of 60+ pounds. Discussed what a healthy diet consists of.   Next appointment: Pt to schedule follow up apt with PCP upon leaving today.   Preventive Care 35 Years and Older, Female Preventive care refers to lifestyle choices and visits with your health care provider that can promote health and wellness. What does preventive care include?  A yearly physical exam. This is also called an annual well check.  Dental exams once or twice a year.  Routine eye exams. Ask your health care provider how often you should have your eyes checked.  Personal lifestyle choices, including:  Daily care of your teeth and gums.  Regular physical activity.  Eating a healthy diet.  Avoiding tobacco and drug use.  Limiting alcohol use.  Practicing safe sex.  Taking low-dose aspirin every day.  Taking vitamin and mineral supplements as recommended by your health care provider. What happens during an annual well check? The services and screenings done by your health care provider during your  annual well check will depend on your age, overall health, lifestyle risk factors, and family history of disease. Counseling  Your health care provider may ask you questions about your:  Alcohol use.  Tobacco use.  Drug use.  Emotional well-being.  Home and relationship well-being.  Sexual activity.  Eating habits.  History of falls.  Memory and ability to understand (cognition).  Work and work Statistician.  Reproductive health. Screening  You may have the following tests or measurements:  Height, weight, and BMI.  Blood pressure.  Lipid and cholesterol levels. These may be checked every 5 years, or more frequently if you are over 18 years old.  Skin check.  Lung cancer screening. You may have this screening every year starting at age 98 if you have a 30-pack-year history of smoking and currently smoke or have quit within the past 15 years.  Fecal occult blood test (FOBT) of the stool. You may have this test every year starting at age 95.  Flexible sigmoidoscopy or colonoscopy. You may have a sigmoidoscopy every 5 years or a colonoscopy every 10 years starting at age 101.  Hepatitis C blood test.  Hepatitis B blood test.  Sexually transmitted disease (STD) testing.  Diabetes screening. This is done by checking your blood sugar (glucose) after you have not eaten for a while (fasting). You may have this done every 1-3 years.  Bone density scan. This is done to screen for osteoporosis. You may have this done starting at age 40.  Mammogram. This may be done every 1-2 years. Talk to your health care provider about how often you should have regular mammograms. Talk with your health care provider about your test  results, treatment options, and if necessary, the need for more tests. Vaccines  Your health care provider may recommend certain vaccines, such as:  Influenza vaccine. This is recommended every year.  Tetanus, diphtheria, and acellular pertussis (Tdap, Td)  vaccine. You may need a Td booster every 10 years.  Zoster vaccine. You may need this after age 23.  Pneumococcal 13-valent conjugate (PCV13) vaccine. One dose is recommended after age 39.  Pneumococcal polysaccharide (PPSV23) vaccine. One dose is recommended after age 64. Talk to your health care provider about which screenings and vaccines you need and how often you need them. This information is not intended to replace advice given to you by your health care provider. Make sure you discuss any questions you have with your health care provider. Document Released: 09/22/2015 Document Revised: 05/15/2016 Document Reviewed: 06/27/2015 Elsevier Interactive Patient Education  2017 [place] Prevention in the Home Falls can cause injuries. They can happen to people of all ages. There are many things you can do to make your home safe and to help prevent falls. What can I do on the outside of my home?  Regularly fix the edges of walkways and driveways and fix any cracks.  Remove anything that might make you trip as you walk through a door, such as a raised step or threshold.  Trim any bushes or trees on the path to your home.  Use bright outdoor lighting.  Clear any walking paths of anything that might make someone trip, such as rocks or tools.  Regularly check to see if handrails are loose or broken. Make sure that both sides of any steps have handrails.  Any raised decks and porches should have guardrails on the edges.  Have any leaves, snow, or ice cleared regularly.  Use sand or salt on walking paths during winter.  Clean up any spills in your garage right away. This includes oil or grease spills. What can I do in the bathroom?  Use night lights.  Install grab bars by the toilet and in the tub and shower. Do not use towel bars as grab bars.  Use non-skid mats or decals in the tub or shower.  If you need to sit down in the shower, use a plastic, non-slip  stool.  Keep the floor dry. Clean up any water that spills on the floor as soon as it happens.  Remove soap buildup in the tub or shower regularly.  Attach bath mats securely with double-sided non-slip rug tape.  Do not have throw rugs and other things on the floor that can make you trip. What can I do in the bedroom?  Use night lights.  Make sure that you have a light by your bed that is easy to reach.  Do not use any sheets or blankets that are too big for your bed. They should not hang down onto the floor.  Have a firm chair that has side arms. You can use this for support while you get dressed.  Do not have throw rugs and other things on the floor that can make you trip. What can I do in the kitchen?  Clean up any spills right away.  Avoid walking on wet floors.  Keep items that you use a lot in easy-to-reach places.  If you need to reach something above you, use a strong step stool that has a grab bar.  Keep electrical cords out of the way.  Do not use floor polish or wax that makes  floors slippery. If you must use wax, use non-skid floor wax.  Do not have throw rugs and other things on the floor that can make you trip. What can I do with my stairs?  Do not leave any items on the stairs.  Make sure that there are handrails on both sides of the stairs and use them. Fix handrails that are broken or loose. Make sure that handrails are as long as the stairways.  Check any carpeting to make sure that it is firmly attached to the stairs. Fix any carpet that is loose or worn.  Avoid having throw rugs at the top or bottom of the stairs. If you do have throw rugs, attach them to the floor with carpet tape.  Make sure that you have a light switch at the top of the stairs and the bottom of the stairs. If you do not have them, ask someone to add them for you. What else can I do to help prevent falls?  Wear shoes that:  Do not have high heels.  Have rubber bottoms.  Are  comfortable and fit you well.  Are closed at the toe. Do not wear sandals.  If you use a stepladder:  Make sure that it is fully opened. Do not climb a closed stepladder.  Make sure that both sides of the stepladder are locked into place.  Ask someone to hold it for you, if possible.  Clearly mark and make sure that you can see:  Any grab bars or handrails.  First and last steps.  Where the edge of each step is.  Use tools that help you move around (mobility aids) if they are needed. These include:  Canes.  Walkers.  Scooters.  Crutches.  Turn on the lights when you go into a dark area. Replace any light bulbs as soon as they burn out.  Set up your furniture so you have a clear path. Avoid moving your furniture around.  If any of your floors are uneven, fix them.  If there are any pets around you, be aware of where they are.  Review your medicines with your doctor. Some medicines can make you feel dizzy. This can increase your chance of falling. Ask your doctor what other things that you can do to help prevent falls. This information is not intended to replace advice given to you by your health care provider. Make sure you discuss any questions you have with your health care provider. Document Released: 06/22/2009 Document Revised: 02/01/2016 Document Reviewed: 09/30/2014 Elsevier Interactive Patient Education  2017 [employer].

## 2017-05-27 LAB — HEPATITIS C ANTIBODY
Hepatitis C Ab: NONREACTIVE
SIGNAL TO CUT-OFF: 0.01 (ref <1.00)

## 2017-05-27 LAB — HIV ANTIBODY (ROUTINE TESTING W REFLEX): HIV 1&2 Ab, 4th Generation: NONREACTIVE

## 2017-06-09 ENCOUNTER — Other Ambulatory Visit: Payer: Self-pay

## 2017-06-09 DIAGNOSIS — I10 Essential (primary) hypertension: Secondary | ICD-10-CM

## 2017-06-09 NOTE — Telephone Encounter (Signed)
Received RX request via [place] Refill request [id number]

## 2017-06-23 ENCOUNTER — Telehealth: Payer: Self-pay

## 2017-06-23 NOTE — Telephone Encounter (Signed)
spoke with the pharmacy there is a shortage of haloperidol and they have no idea when they will get any in.

## 2017-06-23 NOTE — Telephone Encounter (Signed)
pharmacy called they wanted to check the status of what to put pt on since they can not get the haldol.

## 2017-06-23 NOTE — Telephone Encounter (Signed)
Is she out of haldol? We can try another pharmacy. She has been on Haldol for a long time. I cannot start her on a different medication without seeing her.

## 2017-06-23 NOTE — Telephone Encounter (Signed)
pt called left a message with answering services that the pharamcy sttes that they dont have haloperidol in stock there is a shortage.

## 2017-06-24 ENCOUNTER — Encounter: Payer: Self-pay | Admitting: Psychiatry

## 2017-06-24 ENCOUNTER — Ambulatory Visit (INDEPENDENT_AMBULATORY_CARE_PROVIDER_SITE_OTHER): Payer: Medicare Other | Admitting: Psychiatry

## 2017-06-24 VITALS — BP 155/84 | HR 91 | Temp 97.8°F | Wt 265.4 lb

## 2017-06-24 DIAGNOSIS — F200 Paranoid schizophrenia: Secondary | ICD-10-CM | POA: Diagnosis not present

## 2017-06-24 MED ORDER — ARIPIPRAZOLE 5 MG PO TABS: 5.0000 mg | ORAL_TABLET | Freq: Every day | ORAL | 2 refills | Status: DC

## 2017-06-24 NOTE — Telephone Encounter (Signed)
Yes and it affect all pharmacy. They have stopped productions for some reason.

## 2017-06-24 NOTE — Progress Notes (Signed)
Patient ID: Lauren [name], female   DOB: 1951/02/08, 66 y.o.   MRN: [id number]   Psychiatric Progress note  Patient Identification: Lauren [name] MRN:  [id number] Date of Evaluation:  06/24/2017 Chief Complaint:  Ran out of Haldol Chief Complaint    Follow-up; Medication Problem     Visit Diagnosis: Anxiety Disorder, MDD, Schizophrenia  History of Present Illness:  Patient is a 66 yo AAF with Paranoid Schizophrenia here for follow up. Patient today presents for a follow-up since her pharmacy has allowed of Haldol. She reports that she's been having trouble sleeping since she is not on the Haldol. We discussed different substitutes and she states that she would like to try the Abilify. She reports being stressed since her son who also has schizophrenia has been hospitalized. She reports she has been taking Tylenol PM to sleep at night. Denies hearing voices or having any visual hallucinations. She denies any suicidal thoughts. Reports feeling very stressed.   Past Medical History:  Past Medical History:  Diagnosis Date  . Anxiety   . Diabetes mellitus   . Fibromyalgia   . GERD (gastroesophageal reflux disease)   . Heart murmur   . Herniated disc   . Hyperlipidemia   . Hypertension   . Lack of bladder control   . Lung tumor   . Rheumatoid arthritis ([place])   . Schizoaffective disorder ([place])   . 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 last seizure 2013!  [name] Sleep apnea     Past Surgical History:  Procedure Laterality Date  . BRAIN TUMOR EXCISION  2012   benign  . CARDIAC CATHETERIZATION  2008  . CESAREAN SECTION    . CYST REMOVAL HAND    . LUNG LOBECTOMY  1977   benign tumor  . 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 Heart disease Brother   . Depression Mother   .  Heart attack Mother   . Stroke Mother   . Alcohol abuse Father   . Stroke Father   . Diabetes Sister   . Diabetes Sister   . Stomach cancer Sister   . Kidney disease Sister   . COPD Brother   . Lung cancer Brother   . Diabetes Brother    Social History:   Social History   Social History  . Marital status: Single    Spouse name: N/A  . Number of children: N/A  . Years of education: N/A   Social History Main Topics  . Smoking status: Never Smoker  . Smokeless tobacco: Never Used  . Alcohol use No  . Drug use: No  . Sexual activity: No   Other Topics Concern  . None   Social History Narrative  . None   Additional Social History:   Musculoskeletal: Strength & Muscle Tone: within normal limits Gait & Station: broad based Patient leans: N/A  Psychiatric Specialty Exam: Medication Refill     ROS  Blood pressure (!) 155/84, pulse 91, temperature 97.8 F (36.6 C), temperature source Oral, weight 265 lb 6.4 oz (120.4 kg).Body mass index is 42.84 kg/m.  General Appearance: Casual  Eye Contact:  Fair  Speech:  Stilted but articulate  Volume:  Normal  Mood:  anxious  Affect:  Constricted  Thought Process:  Circumstantial  Orientation:  Full (Time, Place, and Person)  Thought Content:  Paranoid Ideation and Rumination  Suicidal Thoughts:  No  Homicidal Thoughts:  No  Memory:  Immediate;   Fair Recent;   Fair Remote;   Fair  Judgement:  Fair  Insight:  Fair  Psychomotor Activity:  NA  Concentration:  Fair  Recall:  [employer] of Knowledge:Fair  Language: Fair  Akathisia:  No  Handed:  Right  AIMS (if indicated):  Some speech impediment, no cog wheeling, done on 11/29/2016  Assets:  Communication Skills Desire for Improvement Financial Resources/Insurance Housing  ADL's:  Intact  Cognition: WNL  Sleep:  okay   Is the patient at risk to self?  No. Has the patient been a risk to self in the past 6 months?  No. Has the patient been a risk to self within  the distant past?  Yes.   Is the patient a risk to others?  No. Has the patient been a risk to others in the past 6 months?  No. Has the patient been a risk to others within the distant past?  No.  Allergies:   Allergies  Allergen Reactions  . Percocet [Oxycodone-Acetaminophen] Diarrhea, Nausea And Vomiting and Nausea Only  . Tramadol Hcl Diarrhea, Nausea And Vomiting and Nausea Only  . Vicodin [Hydrocodone-Acetaminophen] Diarrhea, Nausea And Vomiting and Nausea Only   Current Medications: Current Outpatient Prescriptions  Medication Sig Dispense Refill  . aspirin EC 81 MG tablet Take 81 mg by mouth daily before breakfast.     . atenolol (TENORMIN) 100 MG tablet TAKE 1 TABLET BY MOUTH  DAILY 90 tablet 0  . Bioflavonoid Products (VITAMIN C) CHEW Chew 1 tablet by mouth daily as needed (for immune system support).    . Capsaicin (ZOSTRIX EX) Apply 1 application topically 4 (four) times daily as needed (for knee pain.).    [name] ibuprofen (ADVIL,MOTRIN) 200 MG tablet Take 600 mg by mouth every 8 (eight) hours as needed (for knee pain.).    [name] lisinopril-hydrochlorothiazide (PRINZIDE,ZESTORETIC) 20-25 MG tablet TAKE 1 TABLET BY MOUTH  DAILY 90 tablet 0  . Lotion Base LOTN Apply 1 application topically 4 (four) times daily as needed (for knee pain.). Two Old Goats Essential Lotion (Aloe Vera, [employer], and 6 natural anti-inflammatory essential oils; [place], [place], [place], [place], Peppermint and [place])    . metFORMIN (GLUCOPHAGE) 500 MG tablet TAKE 1 TABLET BY MOUTH TWICE DAILY WITH A MEAL 60 tablet 0  . Multiple Vitamin (MULTIVITAMIN WITH MINERALS) TABS tablet Take 1 tablet by mouth daily before breakfast. Women's 50+    . omeprazole (PRILOSEC) 20 MG capsule Take 1 capsule (20 mg total) by mouth daily. 90 capsule 0  . rosuvastatin (CRESTOR) 40 MG tablet TAKE 1 TABLET BY MOUTH  DAILY 90 tablet 0  . sertraline (ZOLOFT) 50 MG tablet Take 1 tablet (50 mg total) by mouth  daily before breakfast. 90 tablet 1  . ARIPiprazole (ABILIFY) 5 MG tablet Take 1 tablet (5 mg total) by mouth daily. 30 tablet 2   No current facility-administered medications for this visit.     Previous Psychotropic Medications: Yes   Substance Abuse History in the last 12 months:  No.  Consequences of Substance Abuse: Negative  Medical Decision Making:  Established Problem, Stable/Improving (1), Review of Psycho-Social Stressors (1), Review or order clinical lab tests (1), Review and summation of old records (2) and Review of Medication Regimen & Side Effects (2)  Treatment Plan Summary: Medication  management  Paranoid schizophrenia Patient`s pharmacy is out of haldol. We will substitute with Abilify at 5mg  at bedtime.  Anxiety Continue Zoloft at  50 mg by mouth daily. Patient aware of benefits and side effects Discussed deep breathing exercises for patient to deal with her stress.  Return to clinic in 1 month's time or call before if necessary.   Jareli Highland 10/16/20183:44 PM

## 2017-06-24 NOTE — Telephone Encounter (Signed)
Was seen today .

## 2017-06-24 NOTE — Telephone Encounter (Signed)
Pt was called and given an appt for today at 330. Pt states she needs something for today.

## 2017-06-25 ENCOUNTER — Telehealth: Payer: Self-pay

## 2017-06-25 NOTE — Telephone Encounter (Signed)
pt called states she can not afford the medication you changed her too yesterday. pt states it is $600 with her insurance.

## 2017-06-26 ENCOUNTER — Other Ambulatory Visit (HOSPITAL_COMMUNITY): Payer: Self-pay | Admitting: Psychiatry

## 2017-06-26 MED ORDER — QUETIAPINE FUMARATE 50 MG PO TABS: 50.0000 mg | ORAL_TABLET | Freq: Two times a day (BID) | ORAL | 2 refills | Status: DC

## 2017-06-26 NOTE — Telephone Encounter (Signed)
Check if she wants to try seroquel

## 2017-06-26 NOTE — Telephone Encounter (Signed)
Pt called stated that she would be willing to try seroquel .  To please send to [facility] on [address].

## 2017-06-26 NOTE — Telephone Encounter (Signed)
sent 

## 2017-07-02 ENCOUNTER — Encounter: Payer: Self-pay | Admitting: Family Medicine

## 2017-07-07 ENCOUNTER — Other Ambulatory Visit: Payer: Self-pay | Admitting: Family Medicine

## 2017-07-07 ENCOUNTER — Other Ambulatory Visit: Payer: Self-pay | Admitting: Psychiatry

## 2017-07-07 ENCOUNTER — Other Ambulatory Visit (HOSPITAL_COMMUNITY): Payer: Self-pay | Admitting: Psychiatry

## 2017-07-07 DIAGNOSIS — K219 Gastro-esophageal reflux disease without esophagitis: Secondary | ICD-10-CM

## 2017-07-07 DIAGNOSIS — E7800 Pure hypercholesterolemia, unspecified: Secondary | ICD-10-CM

## 2017-07-07 DIAGNOSIS — I10 Essential (primary) hypertension: Secondary | ICD-10-CM

## 2017-07-07 MED ORDER — SERTRALINE HCL 50 MG PO TABS: 50.0000 mg | ORAL_TABLET | Freq: Every day | ORAL | 1 refills | Status: DC

## 2017-07-10 ENCOUNTER — Ambulatory Visit
Admission: RE | Admit: 2017-07-10 | Discharge: 2017-07-10 | Disposition: A | Discharge status: Home / Self Care | Payer: Medicare Other | Source: Ambulatory Visit | Attending: Family Medicine | Admitting: Family Medicine

## 2017-07-10 ENCOUNTER — Ambulatory Visit (INDEPENDENT_AMBULATORY_CARE_PROVIDER_SITE_OTHER): Payer: Medicare Other | Admitting: Family Medicine

## 2017-07-10 ENCOUNTER — Encounter: Payer: Self-pay | Admitting: Family Medicine

## 2017-07-10 VITALS — BP 158/76 | HR 65 | Temp 97.8°F | Resp 14 | Ht 66.0 in | Wt 263.0 lb

## 2017-07-10 DIAGNOSIS — I272 Pulmonary hypertension, unspecified: Secondary | ICD-10-CM | POA: Insufficient documentation

## 2017-07-10 DIAGNOSIS — R0609 Other forms of dyspnea: Secondary | ICD-10-CM | POA: Insufficient documentation

## 2017-07-10 DIAGNOSIS — I517 Cardiomegaly: Secondary | ICD-10-CM | POA: Insufficient documentation

## 2017-07-10 DIAGNOSIS — R0600 Dyspnea, unspecified: Secondary | ICD-10-CM

## 2017-07-10 DIAGNOSIS — R0602 Shortness of breath: Secondary | ICD-10-CM | POA: Diagnosis not present

## 2017-07-10 DIAGNOSIS — R9431 Abnormal electrocardiogram [ECG] [EKG]: Secondary | ICD-10-CM | POA: Diagnosis not present

## 2017-07-10 NOTE — Progress Notes (Signed)
Name: Lauren [name]   MRN: [id number]    DOB: [birth year]   Date:07/10/2017       Progress Note  Subjective  Chief Complaint  Chief Complaint  Patient presents with  . Shortness of Breath    Started 2 weeks. SOB when moving a lot  . Edema    both ankles and feer. Right ankle is swollen more     Shortness of Breath  This is a new problem. The current episode started 1 to 4 weeks ago (2 weeks ago). The problem has been unchanged. Associated symptoms include leg swelling. Pertinent negatives include no chest pain, orthopnea, PND or wheezing. The symptoms are aggravated by exercise. Associated symptoms comments: Notices that her heart flutters when she wakes up from sleep. She has tried nothing for the symptoms. There is no history of CAD, COPD, DVT or pneumonia.      Past Medical History:  Diagnosis Date  . Anxiety   . Diabetes mellitus   . Fibromyalgia   . GERD (gastroesophageal reflux disease)   . Heart murmur   . Herniated disc   . Hyperlipidemia   . Hypertension   . Lack of bladder control   . Lung tumor   . Rheumatoid arthritis ([place])   . Schizoaffective disorder ([place])   . Seizure [facility])    after brain surgery 2012. last seizure 2013!  [name] Sleep apnea     Past Surgical History:  Procedure Laterality Date  . BRAIN TUMOR EXCISION  2012   benign  . CARDIAC CATHETERIZATION  2008  . CESAREAN SECTION    . CYST REMOVAL HAND    . LUNG LOBECTOMY  1977   benign tumor  . TOTAL KNEE ARTHROPLASTY Left 01/28/2017   Procedure: TOTAL KNEE ARTHROPLASTY;  Surgeon: Corky Mull, MD;  Location: [facility] ORS;  Service: Orthopedics;  Laterality: Left;    Family History  Problem Relation Age of Onset  . Heart disease Brother   . Depression Mother   . Heart attack Mother   . Stroke Mother   . Alcohol abuse Father   . Stroke Father   . Diabetes Sister   . Diabetes Sister   . Stomach cancer Sister   . Kidney disease Sister   . COPD Brother   . Lung cancer Brother   . Diabetes  Brother     Social History   Social History  . Marital status: Single    Spouse name: N/A  . Number of children: N/A  . Years of education: N/A   Occupational History  . Not on file.   Social History Main Topics  . Smoking status: Never Smoker  . Smokeless tobacco: Never Used  . Alcohol use No  . Drug use: No  . Sexual activity: No   Other Topics Concern  . Not on file   Social History Narrative  . No narrative on file     Current Outpatient Prescriptions:  .  aspirin EC 81 MG tablet, Take 81 mg by mouth daily before breakfast. , Disp: , Rfl:  .  atenolol (TENORMIN) 100 MG tablet, TAKE 1 TABLET BY MOUTH  DAILY, Disp: 90 tablet, Rfl: 0 .  Bioflavonoid Products (VITAMIN C) CHEW, Chew 1 tablet by mouth daily as needed (for immune system support)., Disp: , Rfl:  .  Capsaicin (ZOSTRIX EX), Apply 1 application topically 4 (four) times daily as needed (for knee pain.)., Disp: , Rfl:  .  ibuprofen (ADVIL,MOTRIN) 200 MG tablet, Take 600 mg by  mouth every 8 (eight) hours as needed (for knee pain.)., Disp: , Rfl:  .  lisinopril-hydrochlorothiazide (PRINZIDE,ZESTORETIC) 20-25 MG tablet, TAKE 1 TABLET BY MOUTH  DAILY, Disp: 90 tablet, Rfl: 0 .  Lotion Base LOTN, Apply 1 application topically 4 (four) times daily as needed (for knee pain.). Two Old Goats Essential Lotion (Aloe Vera, Almond Oil, and 6 natural anti-inflammatory essential oils; [place], [place], [place], [place], Peppermint and [place]), Disp: , Rfl:  .  metFORMIN (GLUCOPHAGE) 500 MG tablet, TAKE 1 TABLET BY MOUTH TWICE DAILY WITH A MEAL, Disp: 60 tablet, Rfl: 0 .  Multiple Vitamin (MULTIVITAMIN WITH MINERALS) TABS tablet, Take 1 tablet by mouth daily before breakfast. Women's 50+, Disp: , Rfl:  .  omeprazole (PRILOSEC) 20 MG capsule, TAKE 1 CAPSULE BY MOUTH  DAILY, Disp: 90 capsule, Rfl: 0 .  QUEtiapine (SEROQUEL) 50 MG tablet, Take 1 tablet (50 mg total) by mouth 2 (two) times daily., Disp: 60 tablet,  Rfl: 2 .  rosuvastatin (CRESTOR) 40 MG tablet, TAKE 1 TABLET BY MOUTH  DAILY, Disp: 90 tablet, Rfl: 0 .  sertraline (ZOLOFT) 50 MG tablet, TAKE 1 TABLET BY MOUTH  DAILY, Disp: 90 tablet, Rfl: 0  Allergies  Allergen Reactions  . Percocet [Oxycodone-Acetaminophen] Diarrhea, Nausea And Vomiting and Nausea Only  . Tramadol Hcl Diarrhea, Nausea And Vomiting and Nausea Only  . Vicodin [Hydrocodone-Acetaminophen] Diarrhea, Nausea And Vomiting and Nausea Only     Review of Systems  Respiratory: Positive for shortness of breath. Negative for wheezing.   Cardiovascular: Positive for leg swelling. Negative for chest pain, orthopnea and PND.      Objective  Vitals:   07/10/17 1542  BP: (!) 158/76  Pulse: 65  Resp: 14  Temp: 97.8 F (36.6 C)  TempSrc: Oral  SpO2: 97%  Weight: 263 lb (119.3 kg)  Height: 5\' 6"  (1.676 m)    Physical Exam  Constitutional: She is well-developed, well-nourished, and in no distress.  Cardiovascular: Normal rate, regular rhythm and normal heart sounds.   Pulmonary/Chest: Effort normal. No respiratory distress. She has decreased breath sounds in the right middle field, the right lower field, the left middle field and the left lower field.  Abdominal: Soft. Bowel sounds are normal. There is no tenderness.  Musculoskeletal:       Right ankle: She exhibits swelling.       Left ankle: She exhibits swelling.  Psychiatric: Mood, memory, affect and judgment normal.  Nursing note and vitals reviewed.     Assessment & Plan  1. DOE (dyspnea on exertion) Rule out CHF versus PE versus ACS (unlikely), EKG reviewed, shows prolonged QT interval. Obtain d-dimer, Pro BNP and chest x-ray - CBC with Differential/Platelet - COMPLETE METABOLIC PANEL WITH GFR - DG Chest 2 View; Future - EKG 12-Lead - Pro b natriuretic peptide (BNP) - D-Dimer, Quantitative  2. EKG abnormality Referral to cardiology for management of EKG abnormality - Ambulatory referral to  Cardiology  [facility] 07/10/2017 3:54 PM

## 2017-07-11 ENCOUNTER — Emergency Department
Admission: EM | Admit: 2017-07-11 | Discharge: 2017-07-12 | Disposition: A | Discharge status: Home / Self Care | Payer: Medicare Other | Attending: Emergency Medicine | Admitting: Emergency Medicine

## 2017-07-11 ENCOUNTER — Encounter: Payer: Self-pay | Admitting: Emergency Medicine

## 2017-07-11 ENCOUNTER — Emergency Department: Payer: Medicare Other

## 2017-07-11 ENCOUNTER — Other Ambulatory Visit: Payer: Self-pay

## 2017-07-11 DIAGNOSIS — I10 Essential (primary) hypertension: Secondary | ICD-10-CM | POA: Insufficient documentation

## 2017-07-11 DIAGNOSIS — R0600 Dyspnea, unspecified: Secondary | ICD-10-CM | POA: Diagnosis not present

## 2017-07-11 DIAGNOSIS — Z7984 Long term (current) use of oral hypoglycemic drugs: Secondary | ICD-10-CM | POA: Diagnosis not present

## 2017-07-11 DIAGNOSIS — Z7982 Long term (current) use of aspirin: Secondary | ICD-10-CM | POA: Insufficient documentation

## 2017-07-11 DIAGNOSIS — R799 Abnormal finding of blood chemistry, unspecified: Secondary | ICD-10-CM | POA: Diagnosis present

## 2017-07-11 DIAGNOSIS — R0602 Shortness of breath: Secondary | ICD-10-CM

## 2017-07-11 DIAGNOSIS — Z79899 Other long term (current) drug therapy: Secondary | ICD-10-CM | POA: Insufficient documentation

## 2017-07-11 DIAGNOSIS — E119 Type 2 diabetes mellitus without complications: Secondary | ICD-10-CM | POA: Diagnosis not present

## 2017-07-11 DIAGNOSIS — J9811 Atelectasis: Secondary | ICD-10-CM

## 2017-07-11 LAB — COMPREHENSIVE METABOLIC PANEL
ALT: 17 U/L (ref 14–54)
AST: 25 U/L (ref 15–41)
Albumin: 3.9 g/dL (ref 3.5–5.0)
Alkaline Phosphatase: 65 U/L (ref 38–126)
Anion gap: 8 (ref 5–15)
BUN: 15 mg/dL (ref 6–20)
CO2: 27 mmol/L (ref 22–32)
Calcium: 9.2 mg/dL (ref 8.9–10.3)
Chloride: 103 mmol/L (ref 101–111)
Creatinine, Ser: 0.65 mg/dL (ref 0.44–1.00)
GFR calc Af Amer: >60 mL/min (ref >60)
GFR calc non Af Amer: >60 mL/min (ref >60)
Glucose, Bld: 105 mg/dL — ABNORMAL HIGH (ref 65–99)
Potassium: 3.1 mmol/L — ABNORMAL LOW (ref 3.5–5.1)
Sodium: 138 mmol/L (ref 135–145)
Total Bilirubin: 0.6 mg/dL (ref 0.3–1.2)
Total Protein: 7.2 g/dL (ref 6.5–8.1)

## 2017-07-11 LAB — CBC WITH DIFFERENTIAL/PLATELET
Basophils Absolute: 61 cells/uL (ref 0–200)
Basophils Absolute: 0.1 10*3/uL (ref 0–0.1)
Basophils Relative: 1 %
Basophils Relative: 1 %
Eosinophils Absolute: 140 cells/uL (ref 15–500)
Eosinophils Absolute: 0.2 10*3/uL (ref 0–0.7)
Eosinophils Relative: 2.3 %
Eosinophils Relative: 3 %
HCT: 37.6 % (ref 35.0–45.0)
HCT: 35.8 % (ref 35.0–47.0)
Hemoglobin: 12.6 g/dL (ref 11.7–15.5)
Hemoglobin: 11.9 g/dL — ABNORMAL LOW (ref 12.0–16.0)
Lymphocytes Relative: 36 %
Lymphs Abs: 2391 cells/uL (ref 850–3900)
Lymphs Abs: 2.3 10*3/uL (ref 1.0–3.6)
MCH: 29.9 pg (ref 27.0–33.0)
MCH: 30.7 pg (ref 26.0–34.0)
MCHC: 33.5 g/dL (ref 32.0–36.0)
MCHC: 33.3 g/dL (ref 32.0–36.0)
MCV: 89.3 fL (ref 80.0–100.0)
MCV: 92.2 fL (ref 80.0–100.0)
MPV: 10.7 fL (ref 7.5–12.5)
Monocytes Absolute: 0.7 10*3/uL (ref 0.2–0.9)
Monocytes Relative: 8.4 %
Monocytes Relative: 11 %
Neutro Abs: 2995 cells/uL (ref 1500–7800)
Neutro Abs: 3.1 10*3/uL (ref 1.4–6.5)
Neutrophils Relative %: 49.1 %
Neutrophils Relative %: 49 %
Platelets: 262 10*3/uL (ref 140–400)
Platelets: 234 10*3/uL (ref 150–440)
RBC: 4.21 10*6/uL (ref 3.80–5.10)
RBC: 3.89 MIL/uL (ref 3.80–5.20)
RDW: 13.8 % (ref 11.0–15.0)
RDW: 15.2 % — ABNORMAL HIGH (ref 11.5–14.5)
Total Lymphocyte: 39.2 %
WBC mixed population: 512 cells/uL (ref 200–950)
WBC: 6.1 10*3/uL (ref 3.8–10.8)
WBC: 6.3 10*3/uL (ref 3.6–11.0)

## 2017-07-11 LAB — COMPLETE METABOLIC PANEL WITH GFR
AG Ratio: 1.4 (calc) (ref 1.0–2.5)
ALT: 17 U/L (ref 6–29)
AST: 18 U/L (ref 10–35)
Albumin: 4.2 g/dL (ref 3.6–5.1)
Alkaline phosphatase (APISO): 69 U/L (ref 33–130)
BUN: 11 mg/dL (ref 7–25)
CO2: 28 mmol/L (ref 20–32)
Calcium: 9.7 mg/dL (ref 8.6–10.4)
Chloride: 101 mmol/L (ref 98–110)
Creat: 0.8 mg/dL (ref 0.50–0.99)
GFR, Est African American: 90 mL/min/{1.73_m2} (ref >=60)
GFR, Est Non African American: 78 mL/min/{1.73_m2} (ref >=60)
Globulin: 3 g/dL (calc) (ref 1.9–3.7)
Glucose, Bld: 91 mg/dL (ref 65–139)
Potassium: 3.7 mmol/L (ref 3.5–5.3)
Sodium: 139 mmol/L (ref 135–146)
Total Bilirubin: 0.6 mg/dL (ref 0.2–1.2)
Total Protein: 7.2 g/dL (ref 6.1–8.1)

## 2017-07-11 LAB — BRAIN NATRIURETIC PEPTIDE: B Natriuretic Peptide: 11 pg/mL (ref 0.0–100.0)

## 2017-07-11 LAB — TROPONIN I: Troponin I: <0.03 ng/mL (ref <0.03)

## 2017-07-11 LAB — D-DIMER, QUANTITATIVE (NOT AT ARMC): D-Dimer, Quant: 2.48 mcg/mL FEU — ABNORMAL HIGH (ref <0.50)

## 2017-07-11 MED ORDER — IOPAMIDOL (ISOVUE-370) INJECTION 76%
75.0000 mL | Freq: Once | INTRAVENOUS | Status: AC | PRN
  Administered 2017-07-11: 75 mL via INTRAVENOUS

## 2017-07-11 NOTE — ED Notes (Signed)
Pt reports seeing her PCP today due to [facility] last 2 weeks, pt reports Dr told her that her EKG done at office today was abnormal compared to last EKG, pt unsure what was abnormal with it. Pt states the Dr told her that her Ddimer was elevated as well and to come to ED. Pt states SHOB with exertion, states hx of sleep apnea and wears cpap at night. Pt denies CP at this time or difficulty breathing. Heart rate shows sinus brady on monitor at this time at 50bpm.  Pt denies recent long travel rides or being stationary more than normal, pt states she "did ride to [facility] a little over a week ago but that was it."

## 2017-07-11 NOTE — ED Provider Notes (Signed)
[facility] Emergency Department Provider Note  ____________________________________________   First MD Initiated Contact with Patient 07/11/17 2303     (approximate)  I have reviewed the triage vital signs and the nursing notes.   HISTORY  Chief Complaint Abnormal Lab   HPI Lauren [name] is a 66 y.o. female who was sent to the emergency department by her primary care physician for 2 weeks of shortness of breath and an elevated d-dimer which was drawn yesterday as an outpatient.  She has no history of DVT or pulmonary embolism.  She says that she does have increasing shortness of breath with exertion.  She denies chest pain.  She denies cough.  She denies fevers or chills.  Her shortness of breath is worse with exertion and improved with rest.  It has been insidious onset and has been slowly progressive.  Past Medical History:  Diagnosis Date  . Anxiety   . Diabetes mellitus   . Fibromyalgia   . GERD (gastroesophageal reflux disease)   . Heart murmur   . Herniated disc   . Hyperlipidemia   . Hypertension   . Lack of bladder control   . Lung tumor   . Rheumatoid arthritis ([place])   . Schizoaffective disorder ([place])   . 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 last seizure 2013!  [name] Sleep apnea     Patient Active Problem List   Diagnosis Date Noted  . 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 Diabetes mellitus 05/30/2016  . Hyperlipidemia 02/14/2016  . Gravida 1 10/26/2015  . Dentagra 10/26/2015  . Itch of skin 10/26/2015  . Sex counseling 10/26/2015  . Excessive sweating 07/05/2015  . Acid reflux 06/29/2015  . Encounter for pre-employment examination 06/29/2015  . Diabetes mellitus type 2, controlled, without complications ([place]) 25/01/3975  . Cardiac murmur 06/29/2015  . Arthritis of knee, degenerative 06/28/2015  . Stiffness of both knees 06/22/2015  . Insomnia w/ sleep apnea 03/21/2015  . Anxiety disorder  03/21/2015  . Hypertension 03/21/2015  . HLD (hyperlipidemia) 03/21/2015  . Paranoid schizophrenia ([place]) 03/21/2015  . Urge incontinence 03/21/2015  . Breathlessness on exertion 02/02/2014  . Awareness of heartbeats 02/02/2014  . Apnea, sleep 02/02/2014    Past Surgical History:  Procedure Laterality Date  . BRAIN TUMOR EXCISION  2012   benign  . CARDIAC CATHETERIZATION  2008  . CESAREAN SECTION    . CYST REMOVAL HAND    . LUNG LOBECTOMY  1977   benign tumor  . TOTAL KNEE ARTHROPLASTY Left 01/28/2017   Procedure: TOTAL KNEE ARTHROPLASTY;  Surgeon: Corky Mull, MD;  Location: [facility] ORS;  Service: Orthopedics;  Laterality: Left;    Prior to Admission medications   Medication Sig Start Date End Date Taking? Authorizing Provider  aspirin EC 81 MG tablet Take 81 mg by mouth daily before breakfast.    Yes [provider]  atenolol (TENORMIN) 100 MG tablet TAKE 1 TABLET BY MOUTH  DAILY 03/17/17  Yes Lada, Satira Anis, MD  Bioflavonoid Products (VITAMIN C) CHEW Chew 1 tablet by mouth daily as needed (for immune system support).   Yes [provider]  Capsaicin (ZOSTRIX EX) Apply 1 application topically 4 (four) times daily as needed (for knee pain.).   Yes [provider]  ibuprofen (ADVIL,MOTRIN) 200 MG tablet Take 600 mg by mouth every 8 (eight) hours as needed (for knee pain.).   Yes [provider]  lisinopril-hydrochlorothiazide (PRINZIDE,ZESTORETIC) 20-25  MG tablet TAKE 1 TABLET BY MOUTH  DAILY 07/08/17  Yes Roselee Nova, MD  [facility] LOTN Apply 1 application topically 4 (four) times daily as needed (for knee pain.). [employer] Essential Lotion (Aloe Vera, [employer], and 6 natural anti-inflammatory essential oils; [place]am[place], [place], [place], [place], Peppermint and [employer])   Yes [provider]  metFORMIN (GLUCOPHAGE) 500 MG tablet TAKE 1 TABLET BY MOUTH TWICE DAILY WITH A MEAL 01/27/17  Yes Roselee Nova,  MD  Multiple Vitamin (MULTIVITAMIN WITH MINERALS) TABS tablet Take 1 tablet by mouth daily before breakfast. Women's 50+   Yes [provider]  omeprazole (PRILOSEC) 20 MG capsule TAKE 1 CAPSULE BY MOUTH  DAILY 07/08/17  Yes Keith Rake Asad A, MD  QUEtiapine (SEROQUEL) 50 MG tablet Take 1 tablet (50 mg total) by mouth 2 (two) times daily. 06/26/17 06/26/18 Yes Ravi, Himabindu, MD  rosuvastatin (CRESTOR) 40 MG tablet TAKE 1 TABLET BY MOUTH  DAILY 07/08/17  Yes Keith Rake Asad A, MD  sertraline (ZOLOFT) 50 MG tablet TAKE 1 TABLET BY MOUTH  DAILY 07/07/17  Yes Elvin So, MD    Allergies Percocet [oxycodone-acetaminophen]; Tramadol hcl; and Vicodin [hydrocodone-acetaminophen]  Family History  Problem Relation Age of Onset  . Heart disease Brother   . Depression Mother   . Heart attack Mother   . Stroke Mother   . Alcohol abuse Father   . Stroke Father   . Diabetes Sister   . Diabetes Sister   . Stomach cancer Sister   . Kidney disease Sister   . COPD Brother   . Lung cancer Brother   . Diabetes Brother     Social History Social History  Substance Use Topics  . Smoking status: Never Smoker  . Smokeless tobacco: Never Used  . Alcohol use No    Review of Systems Constitutional: No fever/chills Eyes: No visual changes. ENT: No sore throat. Cardiovascular: Denies chest pain. Respiratory: Positive for shortness of breath. Gastrointestinal: No abdominal pain.  No nausea, no vomiting.  No diarrhea.  No constipation. Genitourinary: Negative for dysuria. Musculoskeletal: Negative for back pain. Skin: Negative for rash. Neurological: Negative for headaches, focal weakness or numbness.   ____________________________________________   PHYSICAL EXAM:  VITAL SIGNS: ED Triage Vitals  Enc Vitals Group     BP 07/11/17 2005 (!) 166/95     Pulse Rate 07/11/17 2005 78     Resp 07/11/17 2005 16     Temp 07/11/17 2005 98.4 F (36.9 C)     Temp Source 07/11/17 2005  Oral     SpO2 07/11/17 2005 95 %     Weight 07/11/17 2006 263 lb (119.3 kg)     Height 07/11/17 2006 5\' 3"  (1.6 m)     Head Circumference --      Peak Flow --      Pain Score 07/11/17 2005 0     Pain Loc --      Pain Edu? --      Excl. in [place]? --     Constitutional: Alert and oriented x4 speaks in full clear sentences no diaphoresis Eyes: PERRL EOMI. Head: Atraumatic. Nose: No congestion/rhinnorhea. Mouth/Throat: No trismus Neck: No stridor.   Cardiovascular: Normal rate, regular rhythm. Grossly normal heart sounds.  Good peripheral circulation.  Able to lie completely flat with no jugular venous distention Respiratory: Normal respiratory effort.  No retractions. Lungs CTAB and moving good air Gastrointestinal: Morbidly obese soft nontender Musculoskeletal:  1+ edema bilateral lower extremities legs are equal in size Neurologic:  Normal speech and language. No gross focal neurologic deficits are appreciated. Skin:  Skin is warm, dry and intact. No rash noted. Psychiatric: Slow methodical speech    ____________________________________________   DIFFERENTIAL includes but not limited to  Pulmonary embolism, pneumothorax, congestive heart failure, acute coronary syndrome, stable angina ____________________________________________   LABS (all labs ordered are listed, but only abnormal results are displayed)  Labs Reviewed  COMPREHENSIVE METABOLIC PANEL - Abnormal; Notable for the following:       Result Value   Potassium 3.1 (*)    Glucose, Bld 105 (*)    All other components within normal limits  CBC WITH DIFFERENTIAL/PLATELET - Abnormal; Notable for the following:    Hemoglobin 11.9 (*)    RDW 15.2 (*)    All other components within normal limits  TROPONIN I  BRAIN NATRIURETIC PEPTIDE    Blood work reviewed and interpreted by me with no acute disease noted ______________________________________  EKG  ED ECG REPORT I, Darel Hong, the attending physician,  personally viewed and interpreted this ECG.  Date: 07/10/2017 EKG Time: 1618 Rate: 48 Rhythm: Sinus bradycardia QRS Axis: Left right Intervals: Slightly prolonged QTC ST/T Wave abnormalities: normal Narrative Interpretation: no evidence of acute ischemia  ED ECG REPORT I, Darel Hong, the attending physician, personally viewed and interpreted this ECG.  Date: 07/11/2017 EKG Time: 2319 Rate: 55 Rhythm: Sinus bradycardia QRS Axis: normal Intervals: Long QTC ST/T Wave abnormalities: normal Narrative Interpretation: no evidence of acute ischemia  ____________________________________________  RADIOLOGY  CT angio of the chest reviewed by me with no pulmonary embolism but does show right lower lobe atelectasis ____________________________________________   PROCEDURES  Procedure(s) performed: no  Procedures  Critical Care performed: no  Observation: no ____________________________________________   INITIAL IMPRESSION / ASSESSMENT AND PLAN / ED COURSE  Pertinent labs & imaging results that were available during my care of the patient were reviewed by me and considered in my medical decision making (see chart for details).       ----------------------------------------- 11:11 PM on 07/11/2017 -----------------------------------------  Before I saw the patient she had had a CT angiogram of her chest which is negative for blood clot but does show right lower lobe atelectasis.  Her symptoms are not consistent with pneumonia and she is cough.  Unclear etiology of her progressive shortness of breath, however she warrants a troponin and an EKG today. ____________________________________________   ----------------------------------------- 12:45 AM on 07/12/2017 -----------------------------------------  The patient's EKG is nonischemic and her blood work is unremarkable.  Unclear etiology of her shortness of breath.  Discussed with her her atelectasis, however she  has no infectious symptoms I do not believe she has pneumonia.  She understands to follow-up with her primary care physicians coming Monday for recheck.  She is discharged home in improved condition verbalizes understanding and agreement the plan.  FINAL CLINICAL IMPRESSION(S) / ED DIAGNOSES  Final diagnoses:  Shortness of breath  Atelectasis      NEW MEDICATIONS STARTED DURING THIS VISIT:  New Prescriptions   No medications on file     Note:  This document was prepared using Dragon voice recognition software and may include unintentional dictation errors.     Darel Hong, MD 07/12/17 [phone]

## 2017-07-11 NOTE — ED Notes (Signed)
Pt discussed with Dr. Archie Balboa, new orders placed.

## 2017-07-11 NOTE — ED Triage Notes (Signed)
Pt in via POV; received call from PCP today to come in to be evaluated due to elevated D-dimer quant.  Pt reports on going shortness of breath x approximately two weeks, denies any chest pain.  Pt ambulatory to triage without difficulty, vitals WDL, NAD noted at this time.

## 2017-07-12 NOTE — Discharge Instructions (Signed)
Fortunately today your CT scan was reassuring and he did not have a blood clot.  Please do follow-up with your primary care physician this coming Monday for recheck.  Return to the emergency department for any concerns.  It was a pleasure to take care of you today, and thank you for coming to our emergency department.  If you have any questions or concerns before leaving please ask the nurse to grab me and I'm more than happy to go through your aftercare instructions again.  If you were prescribed any opioid pain medication today such as Norco, Vicodin, Percocet, morphine, hydrocodone, or oxycodone please make sure you do not drive when you are taking this medication as it can alter your ability to drive safely.  If you have any concerns once you are home that you are not improving or are in fact getting worse before you can make it to your follow-up appointment, please do not hesitate to call 911 and come back for further evaluation.  Darel Hong, MD  Results for orders placed or performed during the hospital encounter of 07/11/17  Comprehensive metabolic panel  Result Value Ref Range   Sodium 138 135 - 145 mmol/L   Potassium 3.1 (L) 3.5 - 5.1 mmol/L   Chloride 103 101 - 111 mmol/L   CO2 27 22 - 32 mmol/L   Glucose, Bld 105 (H) 65 - 99 mg/dL   BUN 15 6 - 20 mg/dL   Creatinine, Ser 0.65 0.44 - 1.00 mg/dL   Calcium 9.2 8.9 - 10.3 mg/dL   Total Protein 7.2 6.5 - 8.1 g/dL   Albumin 3.9 3.5 - 5.0 g/dL   AST 25 15 - 41 U/L   ALT 17 14 - 54 U/L   Alkaline Phosphatase 65 38 - 126 U/L   Total Bilirubin 0.6 0.3 - 1.2 mg/dL   GFR calc non Af Amer >60 >60 mL/min   GFR calc Af Amer >60 >60 mL/min   Anion gap 8 5 - 15  Troponin I  Result Value Ref Range   Troponin I <0.03 <0.03 ng/mL  CBC with Differential  Result Value Ref Range   WBC 6.3 3.6 - 11.0 K/uL   RBC 3.89 3.80 - 5.20 MIL/uL   Hemoglobin 11.9 (L) 12.0 - 16.0 g/dL   HCT 35.8 35.0 - 47.0 %   MCV 92.2 80.0 - 100.0 fL   MCH 30.7  26.0 - 34.0 pg   MCHC 33.3 32.0 - 36.0 g/dL   RDW 15.2 (H) 11.5 - 14.5 %   Platelets 234 150 - 440 K/uL   Neutrophils Relative % 49 %   Neutro Abs 3.1 1.4 - 6.5 K/uL   Lymphocytes Relative 36 %   Lymphs Abs 2.3 1.0 - 3.6 K/uL   Monocytes Relative 11 %   Monocytes Absolute 0.7 0.2 - 0.9 K/uL   Eosinophils Relative 3 %   Eosinophils Absolute 0.2 0 - 0.7 K/uL   Basophils Relative 1 %   Basophils Absolute 0.1 0 - 0.1 K/uL  Brain natriuretic peptide  Result Value Ref Range   B Natriuretic Peptide 11.0 0.0 - 100.0 pg/mL   Dg Chest 2 View  Result Date: 07/10/2017 CLINICAL DATA:  Shortness of breath for 2 weeks. EXAM: CHEST  2 VIEW COMPARISON:  01/22/2017 and prior radiographs FINDINGS: Cardiomegaly and surgical changes overlying the left hilar/ mediastinum noted. Mild pulmonary vascular congestion identified. There is no evidence of focal airspace disease, pulmonary edema, suspicious pulmonary nodule/mass, pleural effusion, or pneumothorax.  No acute bony abnormalities are identified. IMPRESSION: Cardiomegaly with mild pulmonary vascular congestion. Electronically Signed   By: Margarette Canada M.D.   On: 07/10/2017 19:06   Ct Angio Chest Pe W And/or Wo Contrast  Result Date: 07/11/2017 CLINICAL DATA:  Dyspnea x2 weeks without chest pain. EXAM: CT ANGIOGRAPHY CHEST WITH CONTRAST TECHNIQUE: Multidetector CT imaging of the chest was performed using the standard protocol during bolus administration of intravenous contrast. Multiplanar CT image reconstructions and MIPs were obtained to evaluate the vascular anatomy. CONTRAST:  75 cc Isovue 370 COMPARISON:  None. FINDINGS: Cardiovascular: Cardiomegaly without pericardial effusion. Minimal aortic atherosclerosis without aneurysm or dissection. No large central pulmonary embolus. There is coronary arteriosclerosis along the LAD, circumflex and diagonals. Mediastinum/Nodes: No enlarged mediastinal, hilar, or axillary lymph nodes. Thyroid gland, trachea, and  esophagus demonstrate no significant findings. Lungs/Pleura: Post left upper lobectomy change accounting for surgical clips in the AP window. No pneumonic consolidation, dominant mass, effusion or pneumothorax. Right lower lobe atelectasis. No effusion or pneumothorax. Upper Abdomen: No acute abnormality. Musculoskeletal: Anterior osteophytes are seen noted along the mid to lower thoracic spine. No acute nor suspicious osseous abnormality. Review of the MIP images confirms the above findings. IMPRESSION: 1. Right lower lobe atelectasis. 2. No acute pulmonary embolus or aortic aneurysm.  No dissection. 3. Minimal aortic atherosclerosis and coronary arteriosclerosis. Cardiomegaly. 4. Status post left upper lobectomy. Aortic Atherosclerosis (ICD10-I70.0). Electronically Signed   By: Ashley Royalty M.D.   On: 07/11/2017 20:54

## 2017-07-12 NOTE — ED Notes (Signed)

## 2017-07-14 ENCOUNTER — Ambulatory Visit: Payer: Self-pay

## 2017-07-14 ENCOUNTER — Encounter: Payer: Self-pay | Admitting: Family Medicine

## 2017-07-14 ENCOUNTER — Ambulatory Visit (INDEPENDENT_AMBULATORY_CARE_PROVIDER_SITE_OTHER): Payer: Medicare Other | Admitting: Family Medicine

## 2017-07-14 VITALS — BP 128/76 | HR 81 | Temp 98.1°F | Resp 18 | Ht 66.0 in | Wt 262.2 lb

## 2017-07-14 DIAGNOSIS — R011 Cardiac murmur, unspecified: Secondary | ICD-10-CM | POA: Diagnosis not present

## 2017-07-14 DIAGNOSIS — G4730 Sleep apnea, unspecified: Secondary | ICD-10-CM | POA: Diagnosis not present

## 2017-07-14 DIAGNOSIS — I700 Atherosclerosis of aorta: Secondary | ICD-10-CM | POA: Diagnosis not present

## 2017-07-14 DIAGNOSIS — R0789 Other chest pain: Secondary | ICD-10-CM

## 2017-07-14 DIAGNOSIS — K219 Gastro-esophageal reflux disease without esophagitis: Secondary | ICD-10-CM | POA: Diagnosis not present

## 2017-07-14 DIAGNOSIS — R0681 Apnea, not elsewhere classified: Secondary | ICD-10-CM

## 2017-07-14 DIAGNOSIS — B351 Tinea unguium: Secondary | ICD-10-CM

## 2017-07-14 DIAGNOSIS — E119 Type 2 diabetes mellitus without complications: Secondary | ICD-10-CM | POA: Diagnosis not present

## 2017-07-14 DIAGNOSIS — E7800 Pure hypercholesterolemia, unspecified: Secondary | ICD-10-CM | POA: Diagnosis not present

## 2017-07-14 DIAGNOSIS — G4700 Insomnia, unspecified: Secondary | ICD-10-CM | POA: Diagnosis not present

## 2017-07-14 DIAGNOSIS — I10 Essential (primary) hypertension: Secondary | ICD-10-CM

## 2017-07-14 NOTE — Progress Notes (Signed)
Name: Lauren [name]   MRN: [id number]    DOB: [birth year]   Date:07/14/2017       Progress Note  Subjective  Chief Complaint  Chief Complaint  Patient presents with  . Follow-up    ER follow up SOB, leg swelling    HPI  Patient presents for ER follow up after being sent for elevated D-dimer, shortness of breath, BLE edema, and chest discomfort by Dr. Manuella Ghazi (PCP) on 07/11/2017: - She had elevated D-dimer, CT Angio Chest was negative for PE; Xray showed mild pulmonary vascular congestion and cardiomegaly; Troponin negative, CMP and CBC unremarkable except for mild hypokalemia (3.1), BNP was 11.0. - Endorses DOE - no different than baseline.  BLE swelling has decreased significantly since her ER visit.   - Having intermittent "mild discomfort" under the right breast - this usually occurs when she is active - this is a new symptom that started just prior to ER visit.  She is currently treated with omeprazole for acid reflux and has a hiatal hernia. - Has seen cardiology in the past for palpitations and was told it was related to anxiety.  Pt has new dx of aortic atherosclerosis, has T2DM, HTN, hx cardiac murmur, DOE, OSA, and is morbidly obese - we will refer her back to Cardiology for evaluation of chest pain. - Aortic Atherosclerosis - noted on CT Chest - pt already taking daily 56m ASA and Crestor 463m Explained diagnosis to patient and she verbalizes understanding.   Patient Active Problem List   Diagnosis Date Noted  . 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 Diabetes mellitus 05/30/2016  . Gravida 1 10/26/2015  . Dentagra 10/26/2015  . Itch of skin 10/26/2015  . Sex counseling 10/26/2015  . Excessive sweating 07/05/2015  . Acid reflux 06/29/2015  . Encounter for pre-employment examination 06/29/2015  . Diabetes mellitus type 2, controlled, without complications ([id number]. Cardiac murmur 06/29/2015  . Arthritis of knee, degenerative  06/28/2015  . Stiffness of both knees 06/22/2015  . Insomnia w/ sleep apnea 03/21/2015  . Anxiety disorder 03/21/2015  . Hypertension 03/21/2015  . HLD (hyperlipidemia) 03/21/2015  . Paranoid schizophrenia ([facility]07/08/2015  . Urge incontinence 03/21/2015  . Breathlessness on exertion 02/02/2014  . Awareness of heartbeats 02/02/2014  . Apnea, sleep 02/02/2014    Past Surgical History:  Procedure Laterality Date  . BRAIN TUMOR EXCISION  2012   benign  . CARDIAC CATHETERIZATION  2008  . CESAREAN SECTION    . CYST REMOVAL HAND    . LUNG LOBECTOMY  1977   benign tumor    Family History  Problem Relation Age of Onset  . Heart disease Brother   . Depression Mother   . Heart attack Mother   . Stroke Mother   . Alcohol abuse Father   . Stroke Father   . Diabetes Sister   . Diabetes Sister   . Stomach cancer Sister   . Kidney disease Sister   . COPD Brother   . Lung cancer Brother   . Diabetes Brother     Social History   Socioeconomic History  . Marital status: Single    Spouse name: Not on file  . Number of children: Not on file  . Years of education: Not on file  . Highest education level: Not on file  Social Needs  . Financial resource strain: Not on file  . Food insecurity - worry: Not on file  . Food insecurity -  inability: Not on file  . Transportation needs - medical: Not on file  . Transportation needs - non-medical: Not on file  Occupational History  . Not on file  Tobacco Use  . Smoking status: Never Smoker  . Smokeless tobacco: Never Used  Substance and Sexual Activity  . Alcohol use: No    Alcohol/week: 0.0 oz  . Drug use: No  . Sexual activity: No  Other Topics Concern  . Not on file  Social History Narrative  . Not on file     Current Outpatient Medications:  .  aspirin EC 81 MG tablet, Take 81 mg by mouth daily before breakfast. , Disp: , Rfl:  .  atenolol (TENORMIN) 100 MG tablet, TAKE 1 TABLET BY MOUTH  DAILY, Disp: 90 tablet, Rfl:  0 .  Bioflavonoid Products (VITAMIN C) CHEW, Chew 1 tablet by mouth daily as needed (for immune system support)., Disp: , Rfl:  .  Capsaicin (ZOSTRIX EX), Apply 1 application topically 4 (four) times daily as needed (for knee pain.)., Disp: , Rfl:  .  ibuprofen (ADVIL,MOTRIN) 200 MG tablet, Take 600 mg by mouth every 8 (eight) hours as needed (for knee pain.)., Disp: , Rfl:  .  lisinopril-hydrochlorothiazide (PRINZIDE,ZESTORETIC) 20-25 MG tablet, TAKE 1 TABLET BY MOUTH  DAILY, Disp: 90 tablet, Rfl: 0 .  Lotion Base LOTN, Apply 1 application topically 4 (four) times daily as needed (for knee pain.). Two [employer] Essential Lotion (Aloe Vera, Almond Oil, and 6 natural anti-inflammatory essential oils; [place], [place], [place], [place], Peppermint and Magnolia), Disp: , Rfl:  .  metFORMIN (GLUCOPHAGE) 500 MG tablet, TAKE 1 TABLET BY MOUTH TWICE DAILY WITH A MEAL, Disp: 60 tablet, Rfl: 0 .  Multiple Vitamin (MULTIVITAMIN WITH MINERALS) TABS tablet, Take 1 tablet by mouth daily before breakfast. Women's 50+, Disp: , Rfl:  .  omeprazole (PRILOSEC) 20 MG capsule, TAKE 1 CAPSULE BY MOUTH  DAILY, Disp: 90 capsule, Rfl: 0 .  QUEtiapine (SEROQUEL) 50 MG tablet, Take 1 tablet (50 mg total) by mouth 2 (two) times daily., Disp: 60 tablet, Rfl: 2 .  rosuvastatin (CRESTOR) 40 MG tablet, TAKE 1 TABLET BY MOUTH  DAILY, Disp: 90 tablet, Rfl: 0 .  sertraline (ZOLOFT) 50 MG tablet, TAKE 1 TABLET BY MOUTH  DAILY, Disp: 90 tablet, Rfl: 0  Allergies  Allergen Reactions  . Percocet [Oxycodone-Acetaminophen] Diarrhea, Nausea And Vomiting and Nausea Only  . Tramadol Hcl Diarrhea, Nausea And Vomiting and Nausea Only  . Vicodin [Hydrocodone-Acetaminophen] Diarrhea, Nausea And Vomiting and Nausea Only     ROS Constitutional: Negative for fever or weight change.  Respiratory: Negative for cough; DOE only, no SOB with rest Cardiovascular: See HPI  Gastrointestinal: Negative for abdominal pain, no  bowel changes.  Musculoskeletal: Negative for gait problem or joint swelling.  Skin: Negative for rash.  Neurological: Negative for dizziness or headache.  No other specific complaints in a complete review of systems (except as listed in HPI above).  Objective  Vitals:   07/14/17 1345  BP: 128/76  Pulse: 81  Resp: 18  Temp: 98.1 F (36.7 C)  TempSrc: Oral  SpO2: 94%  Weight: 262 lb 3.2 oz (118.9 kg)  Height: 5' 6"  (1.676 m)   Body mass index is 42.32 kg/m.  Physical Exam Constitutional: Patient appears well-developed and well-nourished. Obese, No distress.  HEENT: head atraumatic, normocephalic, pupils equal and reactive to light Cardiovascular: Normal rate, regular rhythm and normal heart sounds.  No murmur heard. Trace non-pitting  BLE edema. Pulmonary/Chest: Effort normal and breath sounds normal at rest. No respiratory distress. Psychiatric: Patient has a normal mood and affect. behavior is normal. Judgment and thought content normal.  Recent Results (from the past 2160 hour(s))  HIV antibody     Status: None   Collection Time: 05/26/17  2:05 PM  Result Value Ref Range   HIV 1&2 Ab, 4th Generation NON-REACTIVE NON-REACTI    Comment: HIV-1 antigen and HIV-1/HIV-2 antibodies were not detected. There is no laboratory evidence of HIV infection. [name] PLEASE NOTE: This information has been disclosed to you from records whose confidentiality may be protected by state law.  If your state requires such protection, then the state law prohibits you from making any further disclosure of the information without the specific written consent of the person to whom it pertains, or as otherwise permitted by law. A general authorization for the release of medical or other information is NOT sufficient for this purpose. . For additional information please refer to http://education.questdiagnostics.com/faq/FAQ106 (This link is being provided for informational/ educational purposes  only.) . [name] The performance of this assay has not been clinically validated in patients less than 28 years old. .   Hepatitis C antibody screen     Status: None   Collection Time: 05/26/17  2:05 PM  Result Value Ref Range   Hepatitis C Ab NON-REACTIVE NON-REACTI   SIGNAL TO CUT-OFF 0.01 <1.00  CBC with Differential/Platelet     Status: None   Collection Time: 07/10/17  4:34 PM  Result Value Ref Range   WBC 6.1 3.8 - 10.8 Thousand/uL   RBC 4.21 3.80 - 5.10 Million/uL   Hemoglobin 12.6 11.7 - 15.5 g/dL   HCT 37.6 35.0 - 45.0 %   MCV 89.3 80.0 - 100.0 fL   MCH 29.9 27.0 - 33.0 pg   MCHC 33.5 32.0 - 36.0 g/dL   RDW 13.8 11.0 - 15.0 %   Platelets 262 140 - 400 Thousand/uL   MPV 10.7 7.5 - 12.5 fL   Neutro Abs 2,995 1,500 - 7,800 cells/uL   Lymphs Abs 2,391 850 - 3,900 cells/uL   WBC mixed population 512 200 - 950 cells/uL   Eosinophils Absolute 140 15 - 500 cells/uL   Basophils Absolute 61 0 - 200 cells/uL   Neutrophils Relative % 49.1 %   Total Lymphocyte 39.2 %   Monocytes Relative 8.4 %   Eosinophils Relative 2.3 %   Basophils Relative 1.0 %  COMPLETE METABOLIC PANEL WITH GFR     Status: None   Collection Time: 07/10/17  4:34 PM  Result Value Ref Range   Glucose, Bld 91 65 - 139 mg/dL    Comment: .        Non-fasting reference interval .    BUN 11 7 - 25 mg/dL   Creat 0.80 0.50 - 0.99 mg/dL    Comment: For patients >40 years of age, the reference limit for Creatinine is approximately 13% higher for people identified as African-American. .    GFR, Est Non African American 78 > OR = 60 mL/min/1.27m   GFR, Est African American 90 > OR = 60 mL/min/1.798m  BUN/Creatinine Ratio NOT APPLICABLE 6 - 22 (calc)   Sodium 139 135 - 146 mmol/L   Potassium 3.7 3.5 - 5.3 mmol/L   Chloride 101 98 - 110 mmol/L   CO2 28 20 - 32 mmol/L   Calcium 9.7 8.6 - 10.4 mg/dL   Total Protein 7.2 6.1 - 8.1 g/dL  Albumin 4.2 3.6 - 5.1 g/dL   Globulin 3.0 1.9 - 3.7 g/dL (calc)   AG Ratio  1.4 1.0 - 2.5 (calc)   Total Bilirubin 0.6 0.2 - 1.2 mg/dL   Alkaline phosphatase (APISO) 69 33 - 130 U/L   AST 18 10 - 35 U/L   ALT 17 6 - 29 U/L  D-Dimer, Quantitative     Status: Abnormal   Collection Time: 07/10/17  4:34 PM  Result Value Ref Range   D-Dimer, Quant 2.48 (H) <0.50 mcg/mL FEU    Comment: . The D-Dimer test is used frequently to exclude an acute PE or DVT. In patients with a low to moderate clinical risk assessment and a D-Dimer result <0.50 mcg/mL FEU, the likelihood of a PE or DVT is very low. However, a thromboembolic event should not be excluded solely on the basis of the D-Dimer level. Increased levels of D-Dimer are associated with a PE, DVT, DIC, malignancies, inflammation, sepsis, surgery, trauma, pregnancy, and advancing patient age. [[place] 2006 11:295(2):199-207] . For additional information, please refer to: http://education.questdiagnostics.com/faq/FAQ149 (This link is being provided for informational/ educational purposes only) .   Comprehensive metabolic panel     Status: Abnormal   Collection Time: 07/11/17 11:07 PM  Result Value Ref Range   Sodium 138 135 - 145 mmol/L   Potassium 3.1 (L) 3.5 - 5.1 mmol/L   Chloride 103 101 - 111 mmol/L   CO2 27 22 - 32 mmol/L   Glucose, Bld 105 (H) 65 - 99 mg/dL   BUN 15 6 - 20 mg/dL   Creatinine, Ser 0.65 0.44 - 1.00 mg/dL   Calcium 9.2 8.9 - 10.3 mg/dL   Total Protein 7.2 6.5 - 8.1 g/dL   Albumin 3.9 3.5 - 5.0 g/dL   AST 25 15 - 41 U/L   ALT 17 14 - 54 U/L   Alkaline Phosphatase 65 38 - 126 U/L   Total Bilirubin 0.6 0.3 - 1.2 mg/dL   GFR calc non Af Amer >60 >60 mL/min   GFR calc Af Amer >60 >60 mL/min    Comment: (NOTE) The eGFR has been calculated using the CKD EPI equation. This calculation has not been validated in all clinical situations. eGFR's persistently <60 mL/min signify possible Chronic Kidney Disease.    Anion gap 8 5 - 15  Troponin I     Status: None   Collection Time: 07/11/17  11:07 PM  Result Value Ref Range   Troponin I <0.03 <0.03 ng/mL  CBC with Differential     Status: Abnormal   Collection Time: 07/11/17 11:07 PM  Result Value Ref Range   WBC 6.3 3.6 - 11.0 K/uL   RBC 3.89 3.80 - 5.20 MIL/uL   Hemoglobin 11.9 (L) 12.0 - 16.0 g/dL   HCT 35.8 35.0 - 47.0 %   MCV 92.2 80.0 - 100.0 fL   MCH 30.7 26.0 - 34.0 pg   MCHC 33.3 32.0 - 36.0 g/dL   RDW 15.2 (H) 11.5 - 14.5 %   Platelets 234 150 - 440 K/uL   Neutrophils Relative % 49 %   Neutro Abs 3.1 1.4 - 6.5 K/uL   Lymphocytes Relative 36 %   Lymphs Abs 2.3 1.0 - 3.6 K/uL   Monocytes Relative 11 %   Monocytes Absolute 0.7 0.2 - 0.9 K/uL   Eosinophils Relative 3 %   Eosinophils Absolute 0.2 0 - 0.7 K/uL   Basophils Relative 1 %   Basophils Absolute 0.1 0 - 0.1 K/uL  Brain natriuretic peptide     Status: None   Collection Time: 07/11/17 11:07 PM  Result Value Ref Range   B Natriuretic Peptide 11.0 0.0 - 100.0 pg/mL   Diabetic Foot Exam - Simple   Simple Foot Form Visual Inspection No deformities, no ulcerations, no other skin breakdown bilaterally:  Yes Sensation Testing Intact to touch and monofilament testing bilaterally:  Yes Pulse Check Posterior Tibialis and Dorsalis pulse intact bilaterally:  Yes Comments Onychomycosis on several toenails bilaterally.      PHQ2/9: Depression screen [facility] 2/9 05/26/2017 02/18/2017 01/07/2017 12/20/2016 05/30/2016  Decreased Interest 0 0 0 0 0  Down, Depressed, Hopeless 0 0 0 0 0  PHQ - 2 Score 0 0 0 0 0   Fall Risk: Fall Risk  05/26/2017 02/18/2017 01/07/2017 12/20/2016 05/30/2016  Falls in the past year? No No No No No    Assessment & Plan  1. Atypical chest pain - Ambulatory referral to Cardiology  2. Aortic atherosclerosis ([place]) - Ambulatory referral to Cardiology - Pt already taking Statin and ASA therapy. She is aware of diagnosis and verbalizes understanding  3. Essential hypertension - Ambulatory referral to Cardiology - BP at goal today, continue  medications as prescribed  4. Controlled type 2 diabetes mellitus without complication, without long-term current use of insulin ([place]) - Ambulatory referral to Cardiology - Foot exam performed today - we will refer to podiatry for onychomycosis and toenail management per patient request - she is reminded that her eye exam is overdue  5. Insomnia w/ sleep apnea - Ambulatory referral to Cardiology  6. Pure hypercholesterolemia - Ambulatory referral to Cardiology - Continue Rosuvastatin, lifestyle modifications reinforced  7. Cardiac murmur - Ambulatory referral to Cardiology  8. Breathlessness on exertion - Ambulatory referral to Cardiology  9. Gastroesophageal reflux disease, esophagitis presence not specified - Continue Omeprazole and avoid triggers/  10. Onychomycosis - Ambulatory referral to Podiatry  -Red flags and when to present for emergency care or RTC including fever >101.2F, chest pain, shortness of breath, new/worsening/un-resolving symptoms, reviewed with patient at time of visit. Follow up and care instructions discussed and provided in AVS.

## 2017-07-14 NOTE — Patient Instructions (Addendum)

## 2017-07-14 NOTE — Telephone Encounter (Signed)
Pt. called to schedule an appt. with Dr. Manuella Ghazi, after being seen in the ER on 11/2.  Reported she continues to have shortness of breath with activity.  Described walking about 100 feet yesterday, and became very short of breath and broke out in a sweat, and needed to stop and rest.  Stated "it took about 5-10 min. to recover."  Today, able to speak in sentences with minimal to no shortness of breath.  Stated she only notices the shortness of breath with activity.  C/o "mild discomfort in the chest, between breasts, but more on the right side." Reported she has intermittent cough with phlegm that is "mild red".   Denies fever/ chills.  Denied swelling of feet or ankles.  Offered to call Dr. Trena Platt nurse, and will work on getting her an appt.  Pt. Agrees with plan.           Reason for Disposition . [1] MILD difficulty breathing (e.g., minimal/no SOB at rest, SOB with walking, pulse <100) AND [2] NEW-onset or WORSE than normal  Answer Assessment - Initial Assessment Questions 1. RESPIRATORY STATUS: "Describe your breathing?" (e.g., wheezing, shortness of breath, unable to speak, severe coughing)      Gets short of breath with activity; denies wheezing 2. ONSET: "When did this breathing problem begin?"      About 2 weeks ago  3. PATTERN "Does the difficult breathing come and go, or has it been constant since it started?"      Comes and goes with activity 4. SEVERITY: "How bad is your breathing?" (e.g., mild, moderate, severe)    - MILD: No SOB at rest, mild SOB with walking, speaks normally in sentences, can lay down, no retractions, pulse < 100.    - MODERATE: SOB at rest, SOB with minimal exertion and prefers to sit, cannot lie down flat, speaks in phrases, mild retractions, audible wheezing, pulse 100-120.    - SEVERE: Very SOB at rest, speaks in single words, struggling to breathe, sitting hunched forward, retractions, pulse > 120      Reported with walking approx. 100 feet, became very short of  breath and broke out in a sweat; recovered in about 5-10 minutes  5. RECURRENT SYMPTOM: "Have you had difficulty breathing before?" If so, ask: "When was the last time?" and "What happened that time?"      Stated she has been short of breath before; relates being overweight and out of condition 6. CARDIAC HISTORY: "Do you have any history of heart disease?" (e.g., heart attack, angina, bypass surgery, angioplasty)      Admits to a shooting pain in chest at times 7. 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 CAUSE: "What do you think is causing the breathing problem?"      Seen in the ER on 11/2; informed of fluid in right lower lung 9. OTHER SYMPTOMS: "Do you have any other symptoms? (e.g., dizziness, runny nose, cough, chest pain, fever)     Intermittent cough ; states phlegm has a "mild red" color 10. PREGNANCY: "Is there any chance you are pregnant?" "When was your last menstrual period?"       no 11. TRAVEL: "Have you traveled out of the country in the last month?" (e.g., travel history, exposures)       no  Protocols used: BREATHING DIFFICULTY-A-AH

## 2017-07-15 ENCOUNTER — Ambulatory Visit: Payer: 59 | Admitting: Psychiatry

## 2017-07-15 ENCOUNTER — Telehealth: Payer: Self-pay

## 2017-07-15 NOTE — Telephone Encounter (Signed)
I called to inform this patient that she has been scheduled to see Dr. Serafina Royals with [facility] on Wednesday, July 16, 2017 at 9:15am. She was given their number [phone] if that date and time is not good for her.

## 2017-07-16 DIAGNOSIS — I10 Essential (primary) hypertension: Secondary | ICD-10-CM | POA: Diagnosis not present

## 2017-07-16 DIAGNOSIS — I6523 Occlusion and stenosis of bilateral carotid arteries: Secondary | ICD-10-CM | POA: Diagnosis not present

## 2017-07-16 DIAGNOSIS — E781 Pure hyperglyceridemia: Secondary | ICD-10-CM | POA: Diagnosis not present

## 2017-07-16 DIAGNOSIS — I25118 Atherosclerotic heart disease of native coronary artery with other forms of angina pectoris: Secondary | ICD-10-CM | POA: Diagnosis not present

## 2017-07-16 DIAGNOSIS — G4733 Obstructive sleep apnea (adult) (pediatric): Secondary | ICD-10-CM | POA: Diagnosis not present

## 2017-07-17 ENCOUNTER — Telehealth: Payer: Self-pay

## 2017-07-17 NOTE — Telephone Encounter (Signed)
-----   Message from Roselee Nova, MD sent at 07/11/2017  6:27 PM EDT ----- CXR shows cardiomegaly with mild pulmonary vascular congestion. She should be referred to Cardiology to rule out CHF please confirm and will place the referral. She has been recommended to go to the ER for a positive D-dimer test to rule out DVT and/or PE.

## 2017-07-17 NOTE — Telephone Encounter (Signed)
Left a message going over pt xray findings.

## 2017-07-21 ENCOUNTER — Telehealth: Payer: Self-pay

## 2017-07-21 ENCOUNTER — Ambulatory Visit (INDEPENDENT_AMBULATORY_CARE_PROVIDER_SITE_OTHER): Payer: Medicare Other | Admitting: Family Medicine

## 2017-07-21 ENCOUNTER — Encounter: Payer: Self-pay | Admitting: Family Medicine

## 2017-07-21 ENCOUNTER — Inpatient Hospital Stay: Admit: 2017-07-21 | Payer: Self-pay

## 2017-07-21 VITALS — BP 124/76 | HR 53 | Temp 98.0°F | Resp 14 | Ht 66.0 in | Wt 259.3 lb

## 2017-07-21 DIAGNOSIS — N3001 Acute cystitis with hematuria: Secondary | ICD-10-CM | POA: Diagnosis not present

## 2017-07-21 DIAGNOSIS — I25118 Atherosclerotic heart disease of native coronary artery with other forms of angina pectoris: Secondary | ICD-10-CM | POA: Diagnosis not present

## 2017-07-21 DIAGNOSIS — R8290 Unspecified abnormal findings in urine: Secondary | ICD-10-CM | POA: Diagnosis not present

## 2017-07-21 LAB — POCT URINALYSIS DIPSTICK
Bilirubin, UA: NEGATIVE
Glucose, UA: NEGATIVE
Ketones, UA: NEGATIVE
Nitrite, UA: NEGATIVE
Spec Grav, UA: 1.01 (ref 1.010–1.025)
Urobilinogen, UA: 0.2 E.U./dL
pH, UA: 5 (ref 5.0–8.0)

## 2017-07-21 MED ORDER — CIPROFLOXACIN HCL 500 MG PO TABS: 500.0000 mg | ORAL_TABLET | Freq: Two times a day (BID) | ORAL | 0 refills | Status: AC

## 2017-07-21 NOTE — Telephone Encounter (Signed)
Left a message to let the pt know that she has been schedule for her [place] on November 19,2018 at 1pm. She is asked to arrive @12 :45pm with a full bladder at [facility] outpatient imaging, This building is right across the street from our [facility].

## 2017-07-21 NOTE — Progress Notes (Signed)
Name: TZIPORAH KNOKE   MRN: [id number]    DOB: [birth year]   Date:07/21/2017                                               progress Note  Subjective  Chief Complaint  Chief Complaint  Patient presents with  . Urinary Tract Infection    discomfort in the vaginal area, foul smelling ordor for about a week; numbeness in her lower left side of her back    Urinary Tract Infection   This is a new problem. The current episode started in the past 7 days. The quality of the pain is described as burning. There has been no fever. She is not sexually active. There is no history of pyelonephritis. Associated symptoms include flank pain (feeling of 'numbness' on her left flank area.) and urgency. Pertinent negatives include no chills, hematuria, nausea or vomiting. She has tried home medications (cranberry juice.) for the symptoms. The treatment provided no relief.     Past Medical History:  Diagnosis Date  . Anxiety   . Diabetes mellitus   . Fibromyalgia   . GERD (gastroesophageal reflux disease)   . Heart murmur   . Herniated disc   . Hyperlipidemia   . Hypertension   . Lack of bladder control   . Lung tumor   . Rheumatoid arthritis ([place])   . Schizoaffective disorder ([place])   . 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 last seizure 2013!  [name] Sleep apnea     Past Surgical History:  Procedure Laterality Date  . BRAIN TUMOR EXCISION  2012   benign  . CARDIAC CATHETERIZATION  2008  . CESAREAN SECTION    . CYST REMOVAL HAND    . LUNG LOBECTOMY  1977   benign tumor    Family History  Problem Relation Age of Onset  . Heart disease Brother   . Depression Mother   . Heart attack Mother   . Stroke Mother   . Alcohol abuse Father   . Stroke Father   . Diabetes Sister   . Diabetes Sister   . Stomach cancer Sister   . Kidney disease Sister   . COPD Brother   . Lung cancer Brother   . Diabetes Brother     Social History   Socioeconomic History  . Marital status: Single     Spouse name: Not on file  . Number of children: Not on file  . Years of education: Not on file  . Highest education level: Not on file  Social Needs  . Financial resource strain: Not on file  . Food insecurity - worry: Not on file  . Food insecurity - inability: Not on file  . Transportation needs - medical: Not on file  . Transportation needs - non-medical: Not on file  Occupational History  . Not on file  Tobacco Use  . Smoking status: Never Smoker  . Smokeless tobacco: Never Used  Substance and Sexual Activity  . Alcohol use: No    Alcohol/week: 0.0 oz  . Drug use: No  . Sexual activity: No  Other Topics Concern  . Not on file  Social History Narrative  . Not on file     Current Outpatient Medications:  .  aspirin EC 81 MG tablet, Take 81 mg by mouth daily before breakfast. , Disp: ,  Rfl:  .  atenolol (TENORMIN) 100 MG tablet, TAKE 1 TABLET BY MOUTH  DAILY, Disp: 90 tablet, Rfl: 0 .  Bioflavonoid Products (VITAMIN C) CHEW, Chew 1 tablet by mouth daily as needed (for immune system support)., Disp: , Rfl:  .  Capsaicin (ZOSTRIX EX), Apply 1 application topically 4 (four) times daily as needed (for knee pain.)., Disp: , Rfl:  .  ibuprofen (ADVIL,MOTRIN) 200 MG tablet, Take 600 mg by mouth every 8 (eight) hours as needed (for knee pain.)., Disp: , Rfl:  .  lisinopril-hydrochlorothiazide (PRINZIDE,ZESTORETIC) 20-25 MG tablet, TAKE 1 TABLET BY MOUTH  DAILY, Disp: 90 tablet, Rfl: 0 .  Lotion Base LOTN, Apply 1 application topically 4 (four) times daily as needed (for knee pain.). Two Old Goats Essential Lotion (Aloe Vera, Almond Oil, and 6 natural anti-inflammatory essential oils; [place] Ch[place]ile, [place], [place], [place], Peppermint and [place]), Disp: , Rfl:  .  metFORMIN (GLUCOPHAGE) 500 MG tablet, TAKE 1 TABLET BY MOUTH TWICE DAILY WITH A MEAL, Disp: 60 tablet, Rfl: 0 .  Multiple Vitamin (MULTIVITAMIN WITH MINERALS) TABS tablet, Take 1 tablet by mouth daily before  breakfast. Women's 50+, Disp: , Rfl:  .  omeprazole (PRILOSEC) 20 MG capsule, TAKE 1 CAPSULE BY MOUTH  DAILY, Disp: 90 capsule, Rfl: 0 .  QUEtiapine (SEROQUEL) 50 MG tablet, Take 1 tablet (50 mg total) by mouth 2 (two) times daily., Disp: 60 tablet, Rfl: 2 .  rosuvastatin (CRESTOR) 40 MG tablet, TAKE 1 TABLET BY MOUTH  DAILY, Disp: 90 tablet, Rfl: 0 .  sertraline (ZOLOFT) 50 MG tablet, TAKE 1 TABLET BY MOUTH  DAILY, Disp: 90 tablet, Rfl: 0  Allergies  Allergen Reactions  . Percocet [Oxycodone-Acetaminophen] Diarrhea, Nausea And Vomiting and Nausea Only  . Tramadol Hcl Diarrhea, Nausea And Vomiting and Nausea Only  . Vicodin [Hydrocodone-Acetaminophen] Diarrhea, Nausea And Vomiting and Nausea Only     Review of Systems  Constitutional: Negative for chills.  Gastrointestinal: Negative for nausea and vomiting.  Genitourinary: Positive for flank pain (feeling of 'numbness' on her left flank area.) and urgency. Negative for hematuria.      Objective  Vitals:   07/21/17 1021  BP: 124/76  Pulse: (!) 53  Resp: 14  Temp: 98 F (36.7 C)  TempSrc: Oral  SpO2: 95%  Weight: 259 lb 4.8 oz (117.6 kg)  Height: _0  (1.676 m)    Physical Exam  Constitutional: She is well-developed, well-nourished, and in no distress.  Cardiovascular: Normal rate, regular rhythm and normal heart sounds.  No murmur heard. Pulmonary/Chest: Effort normal and breath sounds normal. She has no wheezes.  Abdominal: Soft. Bowel sounds are normal. There is no tenderness. There is no CVA tenderness.  Psychiatric: Mood, memory and judgment normal. She has a flat affect.  Nursing note and vitals reviewed.    Recent Results (from the past 2160 hour(s))  HIV antibody     Status: None   Collection Time: 05/26/17  2:05 PM  Result Value Ref Range   HIV 1&2 Ab, 4th Generation NON-REACTIVE NON-REACTI    Comment: HIV-1 antigen and HIV-1/HIV-2 antibodies were not detected. There is no laboratory evidence of  HIV infection. [name] PLEASE NOTE: This information has been disclosed to you from records whose confidentiality may be protected by state law.  If your state requires such protection, then the state law prohibits you from making any further disclosure of the information without the specific written consent of the person to whom it pertains, or as otherwise  permitted by law. A general authorization for the release of medical or other information is NOT sufficient for this purpose. . For additional information please refer to http://education.questdiagnostics.com/faq/FAQ106 (This link is being provided for informational/ educational purposes only.) . [name] The performance of this assay has not been clinically validated in patients less than 43 years old. .   Hepatitis C antibody screen     Status: None   Collection Time: 05/26/17  2:05 PM  Result Value Ref Range   Hepatitis C Ab NON-REACTIVE NON-REACTI   SIGNAL TO CUT-OFF 0.01 <1.00  CBC with Differential/Platelet     Status: None   Collection Time: 07/10/17  4:34 PM  Result Value Ref Range   WBC 6.1 3.8 - 10.8 Thousand/uL   RBC 4.21 3.80 - 5.10 Million/uL   Hemoglobin 12.6 11.7 - 15.5 g/dL   HCT 37.6 35.0 - 45.0 %   MCV 89.3 80.0 - 100.0 fL   MCH 29.9 27.0 - 33.0 pg   MCHC 33.5 32.0 - 36.0 g/dL   RDW 13.8 11.0 - 15.0 %   Platelets 262 140 - 400 Thousand/uL   MPV 10.7 7.5 - 12.5 fL   Neutro Abs 2,995 1,500 - 7,800 cells/uL   Lymphs Abs 2,391 850 - 3,900 cells/uL   WBC mixed population 512 200 - 950 cells/uL   Eosinophils Absolute 140 15 - 500 cells/uL   Basophils Absolute 61 0 - 200 cells/uL   Neutrophils Relative % 49.1 %   Total Lymphocyte 39.2 %   Monocytes Relative 8.4 %   Eosinophils Relative 2.3 %   Basophils Relative 1.0 %  COMPLETE METABOLIC PANEL WITH GFR     Status: None   Collection Time: 07/10/17  4:34 PM  Result Value Ref Range   Glucose, Bld 91 65 - 139 mg/dL    Comment: .        Non-fasting reference  interval .    BUN 11 7 - 25 mg/dL   Creat 0.80 0.50 - 0.99 mg/dL    Comment: For patients >38 years of age, the reference limit for Creatinine is approximately 13% higher for people identified as African-American. .    GFR, Est Non African American 78 > OR = 60 mL/min/1.30m   GFR, Est African American 90 > OR = 60 mL/min/1.736m  BUN/Creatinine Ratio NOT APPLICABLE 6 - 22 (calc)   Sodium 139 135 - 146 mmol/L   Potassium 3.7 3.5 - 5.3 mmol/L   Chloride 101 98 - 110 mmol/L   CO2 28 20 - 32 mmol/L   Calcium 9.7 8.6 - 10.4 mg/dL   Total Protein 7.2 6.1 - 8.1 g/dL   Albumin 4.2 3.6 - 5.1 g/dL   Globulin 3.0 1.9 - 3.7 g/dL (calc)   AG Ratio 1.4 1.0 - 2.5 (calc)   Total Bilirubin 0.6 0.2 - 1.2 mg/dL   Alkaline phosphatase (APISO) 69 33 - 130 U/L   AST 18 10 - 35 U/L   ALT 17 6 - 29 U/L  D-Dimer, Quantitative     Status: Abnormal   Collection Time: 07/10/17  4:34 PM  Result Value Ref Range   D-Dimer, Quant 2.48 (H) <0.50 mcg/mL FEU    Comment: . The D-Dimer test is used frequently to exclude an acute PE or DVT. In patients with a low to moderate clinical risk assessment and a D-Dimer result <0.50 mcg/mL FEU, the likelihood of a PE or DVT is very low. However, a thromboembolic event should not be excluded  solely on the basis of the D-Dimer level. Increased levels of D-Dimer are associated with a PE, DVT, DIC, malignancies, inflammation, sepsis, surgery, trauma, pregnancy, and advancing patient age. [Jama 2006 11:295(2):199-207] . For additional information, please refer to: http://education.questdiagnostics.com/faq/FAQ149 (This link is being provided for informational/ educational purposes only) .   Comprehensive metabolic panel     Status: Abnormal   Collection Time: 07/11/17 11:07 PM  Result Value Ref Range   Sodium 138 135 - 145 mmol/L   Potassium 3.1 (L) 3.5 - 5.1 mmol/L   Chloride 103 101 - 111 mmol/L   CO2 27 22 - 32 mmol/L   Glucose, Bld 105 (H) 65 - 99 mg/dL    BUN 15 6 - 20 mg/dL   Creatinine, Ser 0.65 0.44 - 1.00 mg/dL   Calcium 9.2 8.9 - 10.3 mg/dL   Total Protein 7.2 6.5 - 8.1 g/dL   Albumin 3.9 3.5 - 5.0 g/dL   AST 25 15 - 41 U/L   ALT 17 14 - 54 U/L   Alkaline Phosphatase 65 38 - 126 U/L   Total Bilirubin 0.6 0.3 - 1.2 mg/dL   GFR calc non Af Amer >60 >60 mL/min   GFR calc Af Amer >60 >60 mL/min    Comment: (NOTE) The eGFR has been calculated using the CKD EPI equation. This calculation has not been validated in all clinical situations. eGFR's persistently <60 mL/min signify possible Chronic Kidney Disease.    Anion gap 8 5 - 15  Troponin I     Status: None   Collection Time: 07/11/17 11:07 PM  Result Value Ref Range   Troponin I <0.03 <0.03 ng/mL  CBC with Differential     Status: Abnormal   Collection Time: 07/11/17 11:07 PM  Result Value Ref Range   WBC 6.3 3.6 - 11.0 K/uL   RBC 3.89 3.80 - 5.20 MIL/uL   Hemoglobin 11.9 (L) 12.0 - 16.0 g/dL   HCT 35.8 35.0 - 47.0 %   MCV 92.2 80.0 - 100.0 fL   MCH 30.7 26.0 - 34.0 pg   MCHC 33.3 32.0 - 36.0 g/dL   RDW 15.2 (H) 11.5 - 14.5 %   Platelets 234 150 - 440 K/uL   Neutrophils Relative % 49 %   Neutro Abs 3.1 1.4 - 6.5 K/uL   Lymphocytes Relative 36 %   Lymphs Abs 2.3 1.0 - 3.6 K/uL   Monocytes Relative 11 %   Monocytes Absolute 0.7 0.2 - 0.9 K/uL   Eosinophils Relative 3 %   Eosinophils Absolute 0.2 0 - 0.7 K/uL   Basophils Relative 1 %   Basophils Absolute 0.1 0 - 0.1 K/uL  Brain natriuretic peptide     Status: None   Collection Time: 07/11/17 11:07 PM  Result Value Ref Range   B Natriuretic Peptide 11.0 0.0 - 100.0 pg/mL  POCT urinalysis dipstick     Status: Abnormal   Collection Time: 07/21/17 10:28 AM  Result Value Ref Range   Color, UA yellow    Clarity, UA clear    Glucose, UA neg    Bilirubin, UA neg    Ketones, UA neg    Spec Grav, UA 1.010 1.010 - 1.025   Blood, UA small    pH, UA 5.0 5.0 - 8.0   Protein, UA trace    Urobilinogen, UA 0.2 0.2 or 1.0  E.U./dL   Nitrite, UA neg    Leukocytes, UA Small (1+) (A) Negative     Assessment & Plan  1. Foul smelling urine Differential diagnosis includes UTI versus pyelonephritis versus kidney stone, send for urinalysis and culture - POCT urinalysis dipstick  2. Acute cystitis with hematuria Based on history and exam, clinical picture is consistent with acute cystitis, started  Cipro 500 mg twice daily x 7 days, obtain renal ultrasound to rule out kidney stones - CBC with Differential - COMPLETE METABOLIC PANEL WITH GFR - Urinalysis, Routine w reflex microscopic - Urine Culture - ciprofloxacin (CIPRO) 500 MG tablet; Take 1 tablet (500 mg total) 2 (two) times daily for 7 days by mouth.  Dispense: 14 tablet; Refill: 0 - US Renal; Future   Adrianne Shackleton [facility] 07/21/2017 10:37 AM

## 2017-07-22 LAB — URINE CULTURE
MICRO NUMBER:: 81272485
Result:: NO GROWTH
SPECIMEN QUALITY:: ADEQUATE

## 2017-07-22 LAB — COMPLETE METABOLIC PANEL WITH GFR
AG Ratio: 1.4 (calc) (ref 1.0–2.5)
ALT: 13 U/L (ref 6–29)
AST: 15 U/L (ref 10–35)
Albumin: 4.3 g/dL (ref 3.6–5.1)
Alkaline phosphatase (APISO): 70 U/L (ref 33–130)
BUN: 8 mg/dL (ref 7–25)
CO2: 32 mmol/L (ref 20–32)
Calcium: 10.1 mg/dL (ref 8.6–10.4)
Chloride: 101 mmol/L (ref 98–110)
Creat: 0.72 mg/dL (ref 0.50–0.99)
GFR, Est African American: 103 mL/min/{1.73_m2} (ref >=60)
GFR, Est Non African American: 88 mL/min/{1.73_m2} (ref >=60)
Globulin: 3 g/dL (calc) (ref 1.9–3.7)
Glucose, Bld: 92 mg/dL (ref 65–99)
Potassium: 3.8 mmol/L (ref 3.5–5.3)
Sodium: 140 mmol/L (ref 135–146)
Total Bilirubin: 0.4 mg/dL (ref 0.2–1.2)
Total Protein: 7.3 g/dL (ref 6.1–8.1)

## 2017-07-22 LAB — CBC WITH DIFFERENTIAL/PLATELET
Basophils Absolute: 80 cells/uL (ref 0–200)
Basophils Relative: 1.4 %
Eosinophils Absolute: 103 cells/uL (ref 15–500)
Eosinophils Relative: 1.8 %
HCT: 39.6 % (ref 35.0–45.0)
Hemoglobin: 13.3 g/dL (ref 11.7–15.5)
Lymphs Abs: 2098 cells/uL (ref 850–3900)
MCH: 29.6 pg (ref 27.0–33.0)
MCHC: 33.6 g/dL (ref 32.0–36.0)
MCV: 88 fL (ref 80.0–100.0)
MPV: 10.9 fL (ref 7.5–12.5)
Monocytes Relative: 12.7 %
Neutro Abs: 2696 cells/uL (ref 1500–7800)
Neutrophils Relative %: 47.3 %
Platelets: 301 10*3/uL (ref 140–400)
RBC: 4.5 10*6/uL (ref 3.80–5.10)
RDW: 13.7 % (ref 11.0–15.0)
Total Lymphocyte: 36.8 %
WBC mixed population: 724 cells/uL (ref 200–950)
WBC: 5.7 10*3/uL (ref 3.8–10.8)

## 2017-07-22 LAB — URINALYSIS, ROUTINE W REFLEX MICROSCOPIC
Bilirubin Urine: NEGATIVE
Glucose, UA: NEGATIVE
Hgb urine dipstick: NEGATIVE
Ketones, ur: NEGATIVE
Leukocytes, UA: NEGATIVE
Nitrite: NEGATIVE
Protein, ur: NEGATIVE
Specific Gravity, Urine: 1.009 (ref 1.001–1.03)
pH: 7 (ref 5.0–8.0)

## 2017-07-23 ENCOUNTER — Ambulatory Visit (INDEPENDENT_AMBULATORY_CARE_PROVIDER_SITE_OTHER): Payer: Medicare Other | Admitting: Psychiatry

## 2017-07-23 ENCOUNTER — Other Ambulatory Visit: Payer: Self-pay

## 2017-07-23 ENCOUNTER — Encounter: Payer: Self-pay | Admitting: Psychiatry

## 2017-07-23 ENCOUNTER — Ambulatory Visit: Payer: Medicare Other | Admitting: Psychiatry

## 2017-07-23 VITALS — BP 91/60 | HR 72 | Temp 98.2°F | Wt 258.8 lb

## 2017-07-23 DIAGNOSIS — F200 Paranoid schizophrenia: Secondary | ICD-10-CM | POA: Diagnosis not present

## 2017-07-23 DIAGNOSIS — F419 Anxiety disorder, unspecified: Secondary | ICD-10-CM

## 2017-07-23 DIAGNOSIS — F330 Major depressive disorder, recurrent, mild: Secondary | ICD-10-CM

## 2017-07-23 MED ORDER — TRAZODONE HCL 50 MG PO TABS: 50.0000 mg | ORAL_TABLET | Freq: Every evening | ORAL | 1 refills | Status: DC | PRN

## 2017-07-23 MED ORDER — QUETIAPINE FUMARATE 100 MG PO TABS: 50.0000 mg | ORAL_TABLET | Freq: Every day | ORAL | 1 refills | Status: DC

## 2017-07-23 NOTE — Progress Notes (Signed)
BH MD/PA/NP OP Progress Note  07/23/2017 12:16 PM [name]  MRN:  [id number]  Chief Complaint: ' I am not doing well."  Chief Complaint    Other; Hallucinations; Insomnia     HPI: Lauren [name] is a 66 year old African-American female with history of paranoid schizophrenia presented to the clinic today for a follow-up visit.  Lauren [name] CMA requested that patient be seen since she seems to be in a crisis.Lauren [name] used to follow up with Dr. Einar Grad , last visit 06/24/2017.  Patient reports that she has been dealing with a lot of negative thoughts.  She reports that these thoughts are giving her a lot of anxiety.  She reports that her thoughts are about other people and it is all bad.  She reports that she is unable to describe it more than that.  She reports that it is not coming out of her head but from within her head.  She reports that she is also anxious on and off due to these thoughts.  She reports she is not sleeping at night in spite of being on the Seroquel.  She reports that she started taking the Seroquel twice a day, it may be helping to some extent.  She is hoping to increase her medication or make some changes today to address her current symptoms.  She denies any side effects to the current medications like dizziness or grogginess or tremors.  She continues to take the Zoloft and she feels it is helping her depressive symptoms.  She denies any suicidal thoughts at this time.  She also denies any homicidal thoughts.  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  She denies any substance abuse problems.  She reports she has not made any plans for Thanksgiving and is currently taking 1 day at a time.  Discussed her low blood pressure at this visit.  She reports that it does not stay low all the time.  Discussed with her to contact her PMD as soon as possible to discuss medications.  She agrees with plan.   Visit Diagnosis:    ICD-10-CM    1. Paranoid schizophrenia ([place]) F20.0 QUEtiapine (SEROQUEL) 100 MG tablet    traZODone (DESYREL) 50 MG tablet  2. MDD (major depressive disorder), recurrent episode, mild ([place]) F33.0   3. Anxiety disorder, unspecified type F41.9     Past Psychiatric History: History of schizophrenia, depression and anxiety since the past several years.  She reports a history of suicide attempt in the past by overdose.  This was a long time ago likely in 1984.  She reports 3-4 inpatient mental health admissions in the past.  She follows up with Dr. Einar Grad here in clinic.  Past Medical History:  Past Medical History:  Diagnosis Date  . Anxiety   . Diabetes mellitus   . Fibromyalgia   . GERD (gastroesophageal reflux disease)   . Heart murmur   . Herniated disc   . Hyperlipidemia   . Hypertension   . Lack of bladder control   . Lung tumor   . Rheumatoid arthritis ([place])   . Schizoaffective disorder ([place])   . Seizure [facility])    after brain surgery 2012. last seizure 2013!  [name] Sleep apnea     Past Surgical History:  Procedure Laterality Date  . BRAIN TUMOR EXCISION  2012   benign  . CARDIAC CATHETERIZATION  2008  . CESAREAN SECTION    . CYST REMOVAL HAND    .  LUNG LOBECTOMY  1977   benign tumor    Family Psychiatric History: Mother-depression, father-alcohol abuse, son-mental health problems.  Denies suicide in her family.  Family History:  Family History  Problem Relation Age of Onset  . Heart disease Brother   . Depression Mother   . Heart attack Mother   . Stroke Mother   . Alcohol abuse Father   . Stroke Father   . Diabetes Sister   . Diabetes Sister   . Stomach cancer Sister   . Kidney disease Sister   . COPD Brother   . Lung cancer Brother   . Diabetes Brother     Social History: She is Single.  She lives in [place].  She reports she has a lot of family in the area.  She is on SSD.  She also works part-time with [employer]. Social History   Socioeconomic  History  . Marital status: Single    Spouse name: None  . Number of children: None  . Years of education: None  . Highest education level: None  Social Needs  . Financial resource strain: Not hard at all  . Food insecurity - worry: Never true  . Food insecurity - inability: Never true  . Transportation needs - medical: No  . Transportation needs - non-medical: No  Occupational History  . None  Tobacco Use  . Smoking status: Never Smoker  . Smokeless tobacco: Never Used  Substance and Sexual Activity  . Alcohol use: No    Alcohol/week: 0.0 oz  . Drug use: No  . Sexual activity: No  Other Topics Concern  . None  Social History Narrative  . None    Allergies:  Allergies  Allergen Reactions  . Percocet [Oxycodone-Acetaminophen] Diarrhea, Nausea And Vomiting and Nausea Only  . Tramadol Hcl Diarrhea, Nausea And Vomiting and Nausea Only  . Vicodin [Hydrocodone-Acetaminophen] Diarrhea, Nausea And Vomiting and Nausea Only    Metabolic Disorder Labs: Lab Results  Component Value Date   HGBA1C 6.5 03/25/2017   No results found for: PROLACTIN Lab Results  Component Value Date   CHOL 170 12/20/2016   TRIG 104 12/20/2016   HDL 47 (L) 12/20/2016   CHOLHDL 3.6 12/20/2016   VLDL 21 12/20/2016   LDLCALC 102 (H) 12/20/2016   LDLCALC 138 (H) 06/27/2016   Lab Results  Component Value Date   TSH 0.674 07/05/2015   TSH 1.15 11/08/2011    Therapeutic Level Labs: No results found for: LITHIUM No results found for: VALPROATE No components found for:  CBMZ  Current Medications: Current Outpatient Medications  Medication Sig Dispense Refill  . aspirin EC 81 MG tablet Take 81 mg by mouth daily before breakfast.     . atenolol (TENORMIN) 100 MG tablet TAKE 1 TABLET BY MOUTH  DAILY 90 tablet 0  . Bioflavonoid Products (VITAMIN C) CHEW Chew 1 tablet by mouth daily as needed (for immune system support).    . Capsaicin (ZOSTRIX EX) Apply 1 application topically 4 (four) times daily  as needed (for knee pain.).    [name] ciprofloxacin (CIPRO) 500 MG tablet Take 1 tablet (500 mg total) 2 (two) times daily for 7 days by mouth. 14 tablet 0  . ibuprofen (ADVIL,MOTRIN) 200 MG tablet Take 600 mg by mouth every 8 (eight) hours as needed (for knee pain.).    [name] lisinopril-hydrochlorothiazide (PRINZIDE,ZESTORETIC) 20-25 MG tablet TAKE 1 TABLET BY MOUTH  DAILY 90 tablet 0  . Lotion Base LOTN Apply 1 application topically  4 (four) times daily as needed (for knee pain.). Two Old Goats Essential Lotion (Aloe Vera, [employer], and 6 natural anti-inflammatory essential oils; [place] Ch[place]ile, [place], [place], [place], Peppermint and [place])    . metFORMIN (GLUCOPHAGE) 500 MG tablet TAKE 1 TABLET BY MOUTH TWICE DAILY WITH A MEAL 60 tablet 0  . Multiple Vitamin (MULTIVITAMIN WITH MINERALS) TABS tablet Take 1 tablet by mouth daily before breakfast. Women's 50+    . omeprazole (PRILOSEC) 20 MG capsule TAKE 1 CAPSULE BY MOUTH  DAILY 90 capsule 0  . rosuvastatin (CRESTOR) 40 MG tablet TAKE 1 TABLET BY MOUTH  DAILY 90 tablet 0  . sertraline (ZOLOFT) 50 MG tablet TAKE 1 TABLET BY MOUTH  DAILY 90 tablet 0  . QUEtiapine (SEROQUEL) 100 MG tablet Take 0.5 tablets (50 mg total) daily by mouth. Take 50 mg in the AM and start taking 2 tablets or 200 mg at bedtime 75 tablet 1  . traZODone (DESYREL) 50 MG tablet Take 1 tablet (50 mg total) at bedtime as needed by mouth for sleep. 30 tablet 1   No current facility-administered medications for this visit.      Musculoskeletal: Strength & Muscle Tone: within normal limits Gait & Station: normal Patient leans: N/A  Psychiatric Specialty Exam: Review of Systems  Psychiatric/Behavioral: The patient is nervous/anxious and has insomnia.   All other systems reviewed and are negative.   Blood pressure 91/60, pulse 72, temperature 98.2 F (36.8 C), temperature source Oral, weight 258 lb 12.8 oz (117.4 kg).Body mass index is 41.77 kg/m.  General  Appearance: Casual  Eye Contact:  Fair  Speech:  Clear and Coherent  Volume:  Normal  Mood:  Anxious  Affect:  Appropriate  Thought Process:  Goal Directed and Descriptions of Associations: Circumstantial  Orientation:  Full (Time, Place, and Person)  Thought Content: Paranoid Ideation and Rumination   Suicidal Thoughts:  No  Homicidal Thoughts:  No  Memory:  Immediate;   Fair Recent;   Fair Remote;   Fair  Judgement:  Fair  Insight:  Fair  Psychomotor Activity:  Normal  Concentration:  Concentration: Fair and Attention Span: Fair  Recall:  [employer] of Knowledge: Fair  Language: Fair  Akathisia:  No  Handed:  Right  AIMS (if indicated): denies tremors , rigidity  Assets:  Communication Skills Desire for [place] Talents/Skills Transportation Vocational/Educational  ADL's:  Intact  Cognition: WNL  Sleep:  Poor   Screenings: PHQ2-9     Office Visit from 07/21/2017 in [place] from 05/26/2017 in [facility] Office Visit from 02/18/2017 in [facility] Office Visit from 01/07/2017 in [facility] Office Visit from 12/20/2016 in [facility]  PHQ-2 Total Score  2  0  0  0  0  PHQ-9 Total Score  4  No data  No data  No data  No data       Assessment and Plan: Lauren [name] presented today due to some worsening of her symptoms, she usually follows up with Dr. Einar Grad in clinic.  Per request from [place], Probation officer evaluated patient today.  Lauren [name] continues to struggle with a lot of negative thought pattern, paranoia.  She reports sleep issues.  She is on a very low dose of Seroquel which she takes in divided doses.  Discussed with her about increasing her dosage, she agrees with the same.  Reviewed her blood pressure which is  low this visit, however she reports that it varies throughout the day.  She is also on blood pressure medications. She denies  any suicidal thoughts.  She continues to be a good candidate for outpatient treatment.  Plan Paranoid schizophrenia Increase Seroquel to 50 mg p.o. daily and 100-200 mg p.o. Nightly. Provided written instruction to take 2 tablets at bedtime initially, if she is groggy or dizzy or has other side effects to reduce dose to 1.5 or 1 tablet.  For depression Zoloft as scheduled per Dr. Einar Grad.  For insomnia Increase Seroquel to 100-200 mg p.o. Nightly Will add trazodone 50 mg p.o. nightly as needed.  She will take it only on nights that she needs it. Discussed the increased side effect profile from combining two sleep aids together.  Also discussed the possibility of increased orthostatic hypotension from combining these 2 medications together.  Provided medication education   For low blood pressure She reports she is on blood pressure medications.  She reports her blood pressure is high/low throughout the day, it varies Provided education about the effect of Seroquel on her blood pressure including orthostatic hypotension. Given written instructions about taking medication. She will follow the 2-minute rule when changing positions. She will also call her PMD to discuss her blood pressure medications.  I have reviewed notes in chart per Dr. Einar Grad.  Discussed with her to call writer/doctor Einar Grad  or return to clinic sooner if she has any concerns.  Follow-up in clinic in 2 weeks.  More than 50 % of the time was spent for psychoeducation and supportive psychotherapy and care coordination.  This note was generated in part or whole with voice recognition software. Voice recognition is usually quite accurate but there are transcription errors that can and very often do occur. I apologize for any typographical errors that were not detected and corrected.         Ursula Alert, MD 07/23/2017, 12:16 PM

## 2017-07-23 NOTE — Patient Instructions (Addendum)
Quetiapine tablets What is this medicine? QUETIAPINE (kwe TYE a peen) is an antipsychotic. It is used to treat schizophrenia and bipolar disorder, also known as manic-depression. This medicine may be used for other purposes; ask your health care provider or pharmacist if you have questions. COMMON BRAND NAME(S): Seroquel What should I tell my health care provider before I take this medicine? They need to know if you have any of these conditions: -brain tumor or head injury -breast cancer -cataracts -diabetes -difficulty swallowing -heart disease -kidney disease -liver disease -low blood counts, like low white cell, platelet, or red cell counts -low blood pressure or dizziness when standing up -Parkinson's disease -previous heart attack -seizures -suicidal thoughts, plans, or attempt by you or a family member -thyroid disease -an unusual or allergic reaction to quetiapine, other medicines, foods, dyes, or preservatives -pregnant or trying to get pregnant -breast-feeding How should I use this medicine? Take this medicine by mouth. Swallow it with a drink of water. Follow the directions on the prescription label. If it upsets your stomach you can take it with food. Take your medicine at regular intervals. Do not take it more often than directed. Do not stop taking except on the advice of your doctor or health care professional. A special MedGuide will be given to you by the pharmacist with each prescription and refill. Be sure to read this information carefully each time. Talk to your pediatrician regarding the use of this medicine in children. While this drug may be prescribed for children as young as 10 years for selected conditions, precautions do apply. Patients over age 50 years may have a stronger reaction to this medicine and need smaller doses. Overdosage: If you think you have taken too much of this medicine contact a poison control center or emergency room at once. NOTE: This  medicine is only for you. Do not share this medicine with others. What if I miss a dose? If you miss a dose, take it as soon as you can. If it is almost time for your next dose, take only that dose. Do not take double or extra doses. What may interact with this medicine? Do not take this medicine with any of the following medications: -certain medicines for fungal infections like fluconazole, itraconazole, ketoconazole, posaconazole, voriconazole -cisapride -dofetilide -dronedarone -droperidol -grepafloxacin -halofantrine -phenothiazines like chlorpromazine, mesoridazine, thioridazine -pimozide -sparfloxacin -ziprasidone This medicine may also interact with the following medications: -alcohol -antiviral medicines for HIV or AIDS -certain medicines for blood pressure -certain medicines for depression, anxiety, or psychotic disturbances like haloperidol, lorazepam -certain medicines for diabetes -certain medicines for Parkinson's disease -certain medicines for seizures like carbamazepine, phenobarbital, phenytoin -cimetidine -erythromycin -other medicines that prolong the QT interval (cause an abnormal heart rhythm) -rifampin -steroid medicines like prednisone or cortisone This list may not describe all possible interactions. Give your health care provider a list of all the medicines, herbs, non-prescription drugs, or dietary supplements you use. Also tell them if you smoke, drink alcohol, or use illegal drugs. Some items may interact with your medicine. What should I watch for while using this medicine? Visit your doctor or health care professional for regular checks on your progress. It may be several weeks before you see the full effects of this medicine. Your health care provider may suggest that you have your eyes examined prior to starting this medicine, and every 6 months thereafter. If you have been taking this medicine regularly for some time, do not suddenly stop taking it.  You must gradually  reduce the dose or your symptoms may get worse. Ask your doctor or health care professional for advice. Patients and their families should watch out for worsening depression or thoughts of suicide. Also watch out for sudden or severe changes in feelings such as feeling anxious, agitated, panicky, irritable, hostile, aggressive, impulsive, severely restless, overly excited and hyperactive, or not being able to sleep. If this happens, especially at the beginning of antidepressant treatment or after a change in dose, call your health care professional. Dennis Bast may get dizzy or drowsy. Do not drive, use machinery, or do anything that needs mental alertness until you know how this medicine affects you. Do not stand or sit up quickly, especially if you are an older patient. This reduces the risk of dizzy or fainting spells. Alcohol can increase dizziness and drowsiness. Avoid alcoholic drinks. Do not treat yourself for colds, diarrhea or allergies. Ask your doctor or health care professional for advice, some ingredients may increase possible side effects. This medicine can reduce the response of your body to heat or cold. Dress warm in cold weather and stay hydrated in hot weather. If possible, avoid extreme temperatures like saunas, hot tubs, very hot or cold showers, or activities that can cause dehydration such as vigorous exercise. What side effects may I notice from receiving this medicine? Side effects that you should report to your doctor or health care professional as soon as possible: -allergic reactions like skin rash, itching or hives, swelling of the face, lips, or tongue -difficulty swallowing -fast or irregular heartbeat -fever or chills, sore throat -fever with rash, swollen lymph nodes, or swelling of the face -increased hunger or thirst -increased urination -problems with balance, talking, walking -seizures -stiff muscles -suicidal thoughts or other mood  changes -uncontrollable head, mouth, neck, arm, or leg movements -unusually weak or tired Side effects that usually do not require medical attention (report to your doctor or health care professional if they continue or are bothersome): -change in sex drive or performance -constipation -drowsy or dizzy -dry mouth -stomach upset -weight gain This list may not describe all possible side effects. Call your doctor for medical advice about side effects. You may report side effects to FDA at 1-800-FDA-1088. Where should I keep my medicine? Keep out of the reach of children. Store at room temperature between 15 and 30 degrees C (59 and 86 degrees F). Throw away any unused medicine after the expiration date. NOTE: This sheet is a summary. It may not cover all possible information. If you have questions about this medicine, talk to your doctor, pharmacist, or health care provider.  2018 Elsevier/Gold Standard (2015-02-28 13:07:35) Trazodone tablets What is this medicine? TRAZODONE (TRAZ oh done) is used to treat depression. This medicine may be used for other purposes; ask your health care provider or pharmacist if you have questions. COMMON BRAND NAME(S): Desyrel What should I tell my health care provider before I take this medicine? They need to know if you have any of these conditions: -attempted suicide or thinking about it -bipolar disorder -bleeding problems -glaucoma -heart disease, or previous heart attack -irregular heart beat -kidney or liver disease -low levels of sodium in the blood -an unusual or allergic reaction to trazodone, other medicines, foods, dyes or preservatives -pregnant or trying to get pregnant -breast-feeding How should I use this medicine? Take this medicine by mouth with a glass of water. Follow the directions on the prescription label. Take this medicine shortly after a meal or a light snack. Take your  medicine at regular intervals. Do not take your medicine  more often than directed. Do not stop taking this medicine suddenly except upon the advice of your doctor. Stopping this medicine too quickly may cause serious side effects or your condition may worsen. A special MedGuide will be given to you by the pharmacist with each prescription and refill. Be sure to read this information carefully each time. Talk to your pediatrician regarding the use of this medicine in children. Special care may be needed. Overdosage: If you think you have taken too much of this medicine contact a poison control center or emergency room at once. NOTE: This medicine is only for you. Do not share this medicine with others. What if I miss a dose? If you miss a dose, take it as soon as you can. If it is almost time for your next dose, take only that dose. Do not take double or extra doses. What may interact with this medicine? Do not take this medicine with any of the following medications: -certain medicines for fungal infections like fluconazole, itraconazole, ketoconazole, posaconazole, voriconazole -cisapride -dofetilide -dronedarone -linezolid -MAOIs like Carbex, Eldepryl, Marplan, Nardil, and Parnate -mesoridazine -methylene blue (injected into a vein) -pimozide -saquinavir -thioridazine -ziprasidone This medicine may also interact with the following medications: -alcohol -antiviral medicines for HIV or AIDS -aspirin and aspirin-like medicines -barbiturates like phenobarbital -certain medicines for blood pressure, heart disease, irregular heart beat -certain medicines for depression, anxiety, or psychotic disturbances -certain medicines for migraine headache like almotriptan, eletriptan, frovatriptan, naratriptan, rizatriptan, sumatriptan, zolmitriptan -certain medicines for seizures like carbamazepine and phenytoin -certain medicines for sleep -certain medicines that treat or prevent blood clots like dalteparin, enoxaparin,  warfarin -digoxin -fentanyl -lithium -NSAIDS, medicines for pain and inflammation, like ibuprofen or naproxen -other medicines that prolong the QT interval (cause an abnormal heart rhythm) -rasagiline -supplements like St. John's wort, kava kava, valerian -tramadol -tryptophan This list may not describe all possible interactions. Give your health care provider a list of all the medicines, herbs, non-prescription drugs, or dietary supplements you use. Also tell them if you smoke, drink alcohol, or use illegal drugs. Some items may interact with your medicine. What should I watch for while using this medicine? Tell your doctor if your symptoms do not get better or if they get worse. Visit your doctor or health care professional for regular checks on your progress. Because it may take several weeks to see the full effects of this medicine, it is important to continue your treatment as prescribed by your doctor. Patients and their families should watch out for new or worsening thoughts of suicide or depression. Also watch out for sudden changes in feelings such as feeling anxious, agitated, panicky, irritable, hostile, aggressive, impulsive, severely restless, overly excited and hyperactive, or not being able to sleep. If this happens, especially at the beginning of treatment or after a change in dose, call your health care professional. Dennis Bast may get drowsy or dizzy. Do not drive, use machinery, or do anything that needs mental alertness until you know how this medicine affects you. Do not stand or sit up quickly, especially if you are an older patient. This reduces the risk of dizzy or fainting spells. Alcohol may interfere with the effect of this medicine. Avoid alcoholic drinks. This medicine may cause dry eyes and blurred vision. If you wear contact lenses you may feel some discomfort. Lubricating drops may help. See your eye doctor if the problem does not go away or is severe. Your mouth  may get dry.  Chewing sugarless gum, sucking hard candy and drinking plenty of water may help. Contact your doctor if the problem does not go away or is severe. What side effects may I notice from receiving this medicine? Side effects that you should report to your doctor or health care professional as soon as possible: -allergic reactions like skin rash, itching or hives, swelling of the face, lips, or tongue -elevated mood, decreased need for sleep, racing thoughts, impulsive behavior -confusion -fast, irregular heartbeat -feeling faint or lightheaded, falls -feeling agitated, angry, or irritable -loss of balance or coordination -painful or prolonged erections -restlessness, pacing, inability to keep still -suicidal thoughts or other mood changes -tremors -trouble sleeping -seizures -unusual bleeding or bruising Side effects that usually do not require medical attention (report to your doctor or health care professional if they continue or are bothersome): -change in sex drive or performance -change in appetite or weight -constipation -headache -muscle aches or pains -nausea This list may not describe all possible side effects. Call your doctor for medical advice about side effects. You may report side effects to FDA at 1-800-FDA-1088. Where should I keep my medicine? Keep out of the reach of children. Store at room temperature between 15 and 30 degrees C (59 to 86 degrees F). Protect from light. Keep container tightly closed. Throw away any unused medicine after the expiration date. NOTE: This sheet is a summary. It may not cover all possible information. If you have questions about this medicine, talk to your doctor, pharmacist, or health care provider.  2018 Elsevier/Gold Standard (2016-01-25 16:57:05)   Reviewed BP - it is low. She will call her PMD to discuss medication.  Discussed to try seroquel 200 mg at bedtime and if it makes her dizzy or make her groggy to go down to 1.5 tablet or  150 mg or even 100 mg or 1 tablet . Also discussed the risk of orthostatic hypotension - risk of falls when on medications like seroquel. Discussed the 2 minute rule when changing positions to avoid falls due to orthostatic hypotension.

## 2017-07-24 ENCOUNTER — Telehealth: Payer: Self-pay | Admitting: Family Medicine

## 2017-07-24 ENCOUNTER — Ambulatory Visit: Payer: Self-pay | Admitting: *Deleted

## 2017-07-24 NOTE — Telephone Encounter (Addendum)
Low b/p at mental health visit, recently. I will schedule a nurse visit to check her b/p.  Reason for Disposition . Wants doctor to measure BP  Answer Assessment - Initial Assessment Questions 1. BLOOD PRESSURE: "What is the blood pressure?" "Did you take at least two measurements 5 minutes apart?"     No, no home b/p 2. ONSET: "When did you take your blood pressure?"     At Mental health office 3. HOW: "How did you obtain the blood pressure?" (e.g., visiting nurse, automatic home BP monitor)     Nurse at mental health took it. 4. HISTORY: "Do you have a history of low blood pressure?" "What is your blood pressure normally?"     no 5. MEDICATIONS: "Are you taking any medications for blood pressure?" If yes: "Have they been changed recently?"     Yes on meds and no changes 6. PULSE RATE: "Do you know what your pulse rate is?"      no 7. OTHER SYMPTOMS: "Have you been sick recently?" "Have you had a recent injury?"    Diarrhea for 2 days only 8. PREGNANCY: "Is there any chance you are pregnant?" "When was your last menstrual period?"    na  Protocols used: LOW BLOOD PRESSURE-A-AH

## 2017-07-24 NOTE — Telephone Encounter (Signed)
Opened in error

## 2017-07-25 ENCOUNTER — Ambulatory Visit: Payer: Self-pay

## 2017-07-28 ENCOUNTER — Ambulatory Visit: Payer: Medicare Other

## 2017-07-29 ENCOUNTER — Encounter: Payer: Self-pay | Admitting: Psychiatry

## 2017-07-29 ENCOUNTER — Encounter: Payer: Self-pay | Admitting: Family Medicine

## 2017-07-29 ENCOUNTER — Ambulatory Visit (INDEPENDENT_AMBULATORY_CARE_PROVIDER_SITE_OTHER): Payer: Medicare Other | Admitting: Psychiatry

## 2017-07-29 ENCOUNTER — Ambulatory Visit (INDEPENDENT_AMBULATORY_CARE_PROVIDER_SITE_OTHER): Payer: Medicare Other | Admitting: Family Medicine

## 2017-07-29 ENCOUNTER — Other Ambulatory Visit: Payer: Self-pay

## 2017-07-29 VITALS — BP 143/85 | HR 73 | Temp 98.1°F | Wt 265.0 lb

## 2017-07-29 DIAGNOSIS — F200 Paranoid schizophrenia: Secondary | ICD-10-CM

## 2017-07-29 DIAGNOSIS — F419 Anxiety disorder, unspecified: Secondary | ICD-10-CM | POA: Diagnosis not present

## 2017-07-29 DIAGNOSIS — E119 Type 2 diabetes mellitus without complications: Secondary | ICD-10-CM

## 2017-07-29 DIAGNOSIS — E782 Mixed hyperlipidemia: Secondary | ICD-10-CM | POA: Diagnosis not present

## 2017-07-29 DIAGNOSIS — Z124 Encounter for screening for malignant neoplasm of cervix: Secondary | ICD-10-CM | POA: Diagnosis not present

## 2017-07-29 HISTORY — DX: Encounter for screening for malignant neoplasm of cervix: Z12.4

## 2017-07-29 NOTE — Assessment & Plan Note (Signed)
Check lipids in January; limit fatty meats

## 2017-07-29 NOTE — Progress Notes (Signed)
BP 122/80 (BP Location: Left Arm, Patient Position: Sitting, Cuff Size: Large)   Pulse 75   Temp 99.1 F (37.3 C) (Oral)   Ht 5\' 4"  (1.626 m)   Wt 263 lb 11.2 oz (119.6 kg)   SpO2 95%   BMI 45.26 kg/m    Subjective:    Patient ID: Lauren [name], female    DOB: 07-31-1951, 66 y.o.   MRN: [id number]  HPI: Lauren [name] is a 66 y.o. female  Chief Complaint  Patient presents with  . Anxiety    Pt states that she has taken Haldol in the past, would like that again, ran out in October   . Depression  . Gynecologic Exam    Pt needs to return for pap     HPI Patient is here to establish care with me Her usual provider is leaving the practice  Type 2 diabetes; diagnosed about 2 years; able to control with diet and oral; not able to eat okay lately, declined nutrition referral; she drinks sodas, just once in a while Lab Results  Component Value Date   HGBA1C 6.5 03/25/2017  glucose 91 last week  Was here last week; was treated with cipro for possible UTI; those symptoms are all better; no fevers; normal CBC  She has depression, schizophrenia; she says she "ran out" of haldol in October; no SI/HI; she asked me for a refill Patient said that Dr. Manuella Ghazi used to give her haloperidol for her mood; she does not want to end up the psychiatrist However, reviewing the chart shows that she had an appt today with Dr. Einar Grad, her psychiatrist; patient says that she did talk to her about this and there was something about the insurance  Last pap smear was years ago; she does not want another pap smear  Blood pressure is well-controlled; used to be too low; she thinks it a good dose  High cholesterol; on statin; tries to limit fatty meats; likes cheese; likes eggs too Lab Results  Component Value Date   CHOL 170 12/20/2016   HDL 47 (L) 12/20/2016   LDLCALC 102 (H) 12/20/2016   TRIG 104 12/20/2016   CHOLHDL 3.6 12/20/2016     Depression screen PHQ 2/9 07/29/2017 07/21/2017  05/26/2017 02/18/2017 01/07/2017  Decreased Interest 0 1 0 0 0  Down, Depressed, Hopeless 1 1 0 0 0  PHQ - 2 Score 1 2 0 0 0  Altered sleeping 1 0 - - -  Tired, decreased energy 1 1 - - -  Change in appetite 1 0 - - -  Feeling bad or failure about yourself  1 0 - - -  Trouble concentrating 0 1 - - -  Moving slowly or fidgety/restless 0 0 - - -  PHQ-9 Score 5 4 - - -  Difficult doing work/chores Somewhat difficult Not difficult at all - - -    Relevant past medical, surgical, family and social history reviewed Past Medical History:  Diagnosis Date  . Anxiety   . Diabetes mellitus   . Fibromyalgia   . GERD (gastroesophageal reflux disease)   . Heart murmur   . Herniated disc   . Hyperlipidemia   . Hypertension   . Lack of bladder control   . Lung tumor   . Rheumatoid arthritis ([place])   . Schizoaffective disorder ([place])   . 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 last seizure 2013!  [name] Sleep apnea    Past Surgical History:  Procedure Laterality Date  . BRAIN TUMOR EXCISION  2012   benign  . CARDIAC CATHETERIZATION  2008  . CESAREAN SECTION    . CYST REMOVAL HAND    . LUNG LOBECTOMY  1977   benign tumor  . 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 Heart disease Brother   . Depression Mother   . Heart attack Mother   . Stroke Mother   . Alcohol abuse Father   . Stroke Father   . Diabetes Sister   . Diabetes Sister   . Stomach cancer Sister   . Kidney disease Sister   . COPD Brother   . Lung cancer Brother   . Diabetes Brother    Social History   Socioeconomic History  . Marital status: Single    Spouse name: Not on file  . Number of children: Not on file  . Years of education: Not on file  . Highest education level: Not on file  Social Needs  . Financial resource strain: Not hard  at all  . Food insecurity - worry: Never true  . Food insecurity - inability: Never true  . Transportation needs - medical: No  . Transportation needs - non-medical: No  Occupational History  . Not on file  Tobacco Use  . Smoking status: Never Smoker  . Smokeless tobacco: Never Used  Substance and Sexual Activity  . Alcohol use: No    Alcohol/week: 0.0 oz  . Drug use: No  . Sexual activity: No  Other Topics Concern  . Not on file  Social History Narrative  . Not on file    Interim medical history since last visit reviewed. Allergies and medications reviewed  Review of Systems Per HPI unless specifically indicated above     Objective:    BP 122/80 (BP Location: Left Arm, Patient Position: Sitting, Cuff Size: Large)   Pulse 75   Temp 99.1 F (37.3 C) (Oral)   Ht 5\' 4"  (1.626 m)   Wt 263 lb 11.2 oz (119.6 kg)   SpO2 95%   BMI 45.26 kg/m   Wt Readings from Last 3 Encounters:  07/29/17 263 lb 11.2 oz (119.6 kg)  07/21/17 259 lb 4.8 oz (117.6 kg)  07/14/17 262 lb 3.2 oz (118.9 kg)    Physical Exam  Constitutional: She appears well-developed and well-nourished. No distress.  HENT:  Head: Normocephalic and atraumatic.  Eyes: EOM are normal. No scleral icterus.  Neck: No thyromegaly present.  Cardiovascular: Normal rate, regular rhythm and normal heart sounds.  No murmur heard. Pulmonary/Chest: Effort normal and breath sounds normal. No respiratory distress. She has no wheezes.  Abdominal: Soft. Bowel sounds are normal. She exhibits no distension.  Musculoskeletal: Normal range of motion. She exhibits no edema.  Neurological: She is alert. She exhibits normal muscle tone.  Skin: Skin is warm and dry. She is not diaphoretic. No pallor.  Psychiatric: She has a normal mood and affect. Her behavior is normal. Judgment and thought content normal.   Diabetic Foot Form - Detailed   Diabetic Foot Exam - detailed Diabetic Foot exam was performed with the following findings:   Yes 07/29/2017  3:26 PM  Visual Foot Exam completed.:  Yes  Pulse Foot Exam completed.:  Yes  Right Dorsalis Pedis:  Present Left Dorsalis Pedis:  Present  Sensory Foot Exam Completed.:  Yes Semmes-Weinstein Monofilament Test R Site 1-Great Toe:  Pos L Site 1-Great Toe:  Pos           Assessment & Plan:   Problem List Items Addressed This Visit      Endocrine   Diabetes mellitus type 2, controlled, without complications ([place])    Foot exam by MD; check A1c in January; limit sugary drinks        Other   Screening for cervical cancer    Patient declined offer / recommendation for pap smear, either here or referred to GYN; explained this is a screening test for cervical cancer, and she still declined      Paranoid schizophrenia ([place])    Patient to continue to f/u with psychiatrist; medicines to be managed by one office (psych)      HLD (hyperlipidemia)    Check lipids in January; limit fatty meats      Anxiety disorder    Will have Dr. Einar Grad prescribe medicine and help with this         Follow up plan: Return in about 2 months (around 09/29/2017) for twenty minute follow-up with fasting labs.  An after-visit summary was printed and given to the patient at [facility].  Please see the patient instructions which may contain other information and recommendations beyond what is mentioned above in the assessment and plan.  No orders of the defined types were placed in this encounter.   No orders of the defined types were placed in this encounter.

## 2017-07-29 NOTE — Progress Notes (Signed)
[facility] MD/PA/NP OP Progress Note  07/29/2017 12:06 PM Lauren Lauren [name]  MRN:  [id number]  Chief Complaint: ' Patient reports being very anxious."  Chief Complaint    Follow-up; Medication Refill     HPI: Lauren Lauren [name] is a 66 year old African-American female with history of paranoid schizophrenia presented to the clinic today for a follow-up visit. She reports having bad thought patterns. States someone is telling her what to do , says these are her thoughts telling her what to do. She is very focused on the Haldol, says "why did they stop making it?" This clinician tried to discuss with the patient that production off Haldol was totally stopped. Try to engage her in other treatment plans. She kept stating that she did not want to be drugged up and wanted to go back on the Haldol. Patient was very irate and repeated the same statements to the nurse as well. She was not listening to any of the other options this clinician propose to her. She denied having any suicidal thoughts. She just reported that she was just having these bad thoughts and she needed to go back on the Haldol. We discussed that she will transition to Dr. Shea [name] starting next visit and she was okay with that. She denies any substance abuse problems.   Visit Diagnosis:    ICD-10-CM   1. Paranoid schizophrenia ([place]) F20.0   2. Anxiety disorder, unspecified type F41.9     Past Psychiatric History: History of schizophrenia, depression and anxiety since the past several years.  She reports a history of suicide attempt in the past by overdose.  This was a long time ago likely in 1984.  She reports 3-4 inpatient mental health admissions in the past.  She follows up with Dr. Einar [name] here in clinic.  Past Medical History:  Past Medical History:  Diagnosis Date  . Anxiety   . Diabetes mellitus   . Fibromyalgia   . GERD (gastroesophageal reflux disease)   . Heart murmur   . Herniated disc   . Hyperlipidemia   . Hypertension   . Lack of  bladder control   . Lung tumor   . Rheumatoid arthritis ([place])   . Schizoaffective disorder ([place])   . Seizure [facility])    after brain surgery 2012. last seizure 2013!  [name] Sleep apnea     Past Surgical History:  Procedure Laterality Date  . BRAIN TUMOR EXCISION  2012   benign  . CARDIAC CATHETERIZATION  2008  . CESAREAN SECTION    . CYST REMOVAL HAND    . LUNG LOBECTOMY  1977   benign tumor  . 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  Denies suicide in her family.  Family History:  Family History  Problem Relation Age of Onset  . Heart disease Brother   . Depression Mother   . Heart attack Mother   . Stroke Mother   . Alcohol abuse Father   . Stroke Father   . Diabetes Sister   . Diabetes Sister   . Stomach cancer Sister   . Kidney disease Sister   . COPD Brother   . Lung cancer Brother   . Diabetes Brother     Social History: She is Single.  She lives in [place].  She reports she has a lot of family in  the area.  She is on SSD.  She also works part-time with [employer]. Social History   Socioeconomic History  . Marital status: Single    Spouse Lauren [name]: None  . Number of children: None  . Years of education: None  . Highest education level: None  Social Needs  . Financial resource strain: Not hard at all  . Food insecurity - worry: Never true  . Food insecurity - inability: Never true  . Transportation needs - medical: No  . Transportation needs - non-medical: No  Occupational History  . None  Tobacco Use  . Smoking status: Never Smoker  . Smokeless tobacco: Never Used  Substance and Sexual Activity  . Alcohol use: No    Alcohol/week: 0.0 oz  . Drug use: No  . Sexual activity: No  Other Topics Concern  . None  Social  History Narrative  . None    Allergies:  Allergies  Allergen Reactions  . Percocet [Oxycodone-Acetaminophen] Diarrhea, Nausea And Vomiting and Nausea Only  . Tramadol Hcl Diarrhea, Nausea And Vomiting and Nausea Only  . Vicodin [Hydrocodone-Acetaminophen] Diarrhea, Nausea And Vomiting and Nausea Only    Metabolic Disorder Labs: Lab Results  Component Value Date   HGBA1C 6.5 03/25/2017   No results found for: PROLACTIN Lab Results  Component Value Date   CHOL 170 12/20/2016   TRIG 104 12/20/2016   HDL 47 (L) 12/20/2016   CHOLHDL 3.6 12/20/2016   VLDL 21 12/20/2016   LDLCALC 102 (H) 12/20/2016   LDLCALC 138 (H) 06/27/2016   Lab Results  Component Value Date   TSH 0.674 07/05/2015   TSH 1.15 11/08/2011    Therapeutic Level Labs: No results found for: LITHIUM No results found for: VALPROATE No components found for:  CBMZ  Current Medications: Current Outpatient Medications  Medication Sig Dispense Refill  . aspirin EC 81 MG tablet Take 81 mg by mouth daily before breakfast.     . atenolol (TENORMIN) 100 MG tablet TAKE 1 TABLET BY MOUTH  DAILY 90 tablet 0  . Bioflavonoid Products (VITAMIN C) CHEW Chew 1 tablet by mouth daily as needed (for immune system support).    . Capsaicin (ZOSTRIX EX) Apply 1 application topically 4 (four) times daily as needed (for knee pain.).    [name] ibuprofen (ADVIL,MOTRIN) 200 MG tablet Take 600 mg by mouth every 8 (eight) hours as needed (for knee pain.).    [name] lisinopril-hydrochlorothiazide (PRINZIDE,ZESTORETIC) 20-25 MG tablet TAKE 1 TABLET BY MOUTH  DAILY 90 tablet 0  . Lotion Base LOTN Apply 1 application topically 4 (four) times daily as needed (for knee pain.). [employer] Essential Lotion (Aloe Vera, [employer], and 6 natural anti-inflammatory essential oils; [place], [place], [place], [place], Peppermint and Jansen)    . metFORMIN (GLUCOPHAGE) 500 MG tablet TAKE 1 TABLET BY MOUTH TWICE DAILY WITH A MEAL 60 tablet  0  . Multiple Vitamin (MULTIVITAMIN WITH MINERALS) TABS tablet Take 1 tablet by mouth daily before breakfast. Women's 50+    . omeprazole (PRILOSEC) 20 MG capsule TAKE 1 CAPSULE BY MOUTH  DAILY 90 capsule 0  . QUEtiapine (SEROQUEL) 100 MG tablet Take 0.5 tablets (50 mg total) daily by mouth. Take 50 mg in the AM and start taking 2 tablets or 200 mg at bedtime 75 tablet 1  . rosuvastatin (CRESTOR) 40 MG tablet TAKE 1 TABLET BY MOUTH  DAILY 90 tablet 0  . sertraline (ZOLOFT) 50 MG tablet TAKE 1 TABLET BY  MOUTH  DAILY 90 tablet 0  . traZODone (DESYREL) 50 MG tablet Take 1 tablet (50 mg total) at bedtime as needed by mouth for sleep. 30 tablet 1   No current facility-administered medications for this visit.      Musculoskeletal: Strength & Muscle Tone: within normal limits Gait & Station: normal Patient leans: N/A  Psychiatric Specialty Exam: Review of Systems  Psychiatric/Behavioral: The patient is nervous/anxious and has insomnia.   All other systems reviewed and are negative.   Blood pressure (!) 143/85, pulse 73, temperature 98.1 F (36.7 C), temperature source Oral, weight 265 lb (120.2 kg).Body mass index is 42.77 kg/m.  General Appearance: Casual  Eye Contact:  Fair  Speech:  Clear and Coherent  Volume:  Normal  Mood:  Anxious  Affect:  Appropriate  Thought Process:  Goal Directed and Descriptions of Associations: Circumstantial  Orientation:  Full (Time, Place, and Person)  Thought Content: Paranoid Ideation and Rumination   Suicidal Thoughts:  No  Homicidal Thoughts:  No  Memory:  Immediate;   Fair Recent;   Fair Remote;   Fair  Judgement:  Fair  Insight:  Fair  Psychomotor Activity:  Normal  Concentration:  Concentration: Fair and Attention Span: Fair  Recall:  [employer] of Knowledge: Fair  Language: Fair  Akathisia:  No  Handed:  Right  AIMS (if indicated): denies tremors , rigidity  Assets:  Communication Skills Desire for [place] Talents/Skills Transportation Vocational/Educational  ADL's:  Intact  Cognition: WNL  Sleep:  Poor   Screenings: PHQ2-9     Office Visit from 07/21/2017 in [place] from 05/26/2017 in [facility] Office Visit from 02/18/2017 in [facility] Office Visit from 01/07/2017 in [facility] Office Visit from 12/20/2016 in [facility]  PHQ-2 Total Score  2  0  0  0  0  PHQ-9 Total Score  4  No data  No data  No data  No data       Assessment and Plan: Nekayla presented today stating she was having bad thoughts. However she denied any suicidal thoughts. Her focus was on obtaining Haldol and she was told that the company had stopped producing Haldol and it is not available. Patient somewhat irate and stated that the Seroquel at the increased dosage made her drug and she does not want to take that. She is not willing to listen to other treatment options.   Paranoid schizophrenia Continue Seroquel at 50 mg p.o. daily and 100-200 mg p.o. Nightly per Dr. Shea [name]. Patient will transition to Dr. Shea [name] starting her next visit. Provided written instruction to take 2 tablets at bedtime initially, if she is groggy or dizzy or has other side effects to reduce dose to 1.5 or 1 tablet.  For depression Continue zoloft at 50 mg daily and consider increasing the dose at next visit..  For insomnia Continue Seroquel to 100-200 mg p.o. Nightly Patient did not inform if she was taking the trazodone. Follow-up during next visit.   Follow-up in clinic in 4 weeks. Patient agreeable to transfer to Dr. Shea [name].   More than 50 % of the time was spent for psychoeducation and supportive psychotherapy and care coordination.  This note was generated in part or whole with voice recognition software. Voice recognition is usually quite accurate but there are transcription errors that can and very often do  occur. I apologize for any typographical errors  that were not detected and corrected.         Elvin So, MD 07/29/2017, 12:06 PM

## 2017-07-29 NOTE — Assessment & Plan Note (Signed)
Foot exam by MD; check A1c in January; limit sugary drinks

## 2017-07-29 NOTE — Assessment & Plan Note (Signed)
Will have Dr. Einar Grad prescribe medicine and help with this

## 2017-07-29 NOTE — Assessment & Plan Note (Signed)
Patient declined offer / recommendation for pap smear, either here or referred to GYN; explained this is a screening test for cervical cancer, and she still declined

## 2017-07-29 NOTE — Assessment & Plan Note (Signed)
Patient to continue to f/u with psychiatrist; medicines to be managed by one office (psych)

## 2017-07-29 NOTE — Patient Instructions (Signed)
Return in late January for your next visit and fasting labs Please do talk with Dr. Einar Grad about your mood and anxiety medicines

## 2017-08-04 ENCOUNTER — Ambulatory Visit: Payer: Self-pay | Admitting: Podiatry

## 2017-08-04 ENCOUNTER — Telehealth: Payer: Self-pay

## 2017-08-04 NOTE — Telephone Encounter (Signed)
pt called on friday and left a message on voice mail that she would like to increase her trazodone to 100mg 

## 2017-08-04 NOTE — Telephone Encounter (Signed)
This patient will now be followed by Dr. Shea Evans

## 2017-08-05 ENCOUNTER — Ambulatory Visit: Payer: 59 | Admitting: Psychiatry

## 2017-08-07 NOTE — Telephone Encounter (Signed)
DR. Einar Grad REF PT TO YOU. PLEASE ADVISE.

## 2017-08-08 NOTE — Telephone Encounter (Signed)
I WILL SEE HER

## 2017-08-11 ENCOUNTER — Ambulatory Visit: Payer: Self-pay | Admitting: Family Medicine

## 2017-08-12 ENCOUNTER — Telehealth: Payer: Self-pay

## 2017-08-12 NOTE — Telephone Encounter (Signed)
pt called states she needs to get an appt with dr. Burney Gauze.

## 2017-08-12 NOTE — Telephone Encounter (Signed)
called left message for pt to call back to set up appt with dr. Burney Gauze.

## 2017-08-13 ENCOUNTER — Ambulatory Visit: Payer: Medicare Other | Admitting: Psychiatry

## 2017-08-16 ENCOUNTER — Other Ambulatory Visit: Payer: Self-pay

## 2017-08-16 ENCOUNTER — Emergency Department: Payer: Medicare Other

## 2017-08-16 ENCOUNTER — Emergency Department
Admission: EM | Admit: 2017-08-16 | Discharge: 2017-08-16 | Discharge status: Against Medical Advice | Payer: Medicare Other | Attending: Emergency Medicine | Admitting: Emergency Medicine

## 2017-08-16 DIAGNOSIS — R0789 Other chest pain: Secondary | ICD-10-CM | POA: Diagnosis not present

## 2017-08-16 DIAGNOSIS — I10 Essential (primary) hypertension: Secondary | ICD-10-CM | POA: Insufficient documentation

## 2017-08-16 DIAGNOSIS — Z7984 Long term (current) use of oral hypoglycemic drugs: Secondary | ICD-10-CM | POA: Insufficient documentation

## 2017-08-16 DIAGNOSIS — E785 Hyperlipidemia, unspecified: Secondary | ICD-10-CM | POA: Diagnosis not present

## 2017-08-16 DIAGNOSIS — R079 Chest pain, unspecified: Secondary | ICD-10-CM | POA: Diagnosis not present

## 2017-08-16 DIAGNOSIS — E119 Type 2 diabetes mellitus without complications: Secondary | ICD-10-CM | POA: Insufficient documentation

## 2017-08-16 DIAGNOSIS — Z791 Long term (current) use of non-steroidal anti-inflammatories (NSAID): Secondary | ICD-10-CM | POA: Diagnosis not present

## 2017-08-16 DIAGNOSIS — J9811 Atelectasis: Secondary | ICD-10-CM | POA: Diagnosis not present

## 2017-08-16 DIAGNOSIS — Z79899 Other long term (current) drug therapy: Secondary | ICD-10-CM | POA: Diagnosis not present

## 2017-08-16 DIAGNOSIS — Z7982 Long term (current) use of aspirin: Secondary | ICD-10-CM | POA: Insufficient documentation

## 2017-08-16 LAB — COMPREHENSIVE METABOLIC PANEL
ALT: 18 U/L (ref 14–54)
AST: 25 U/L (ref 15–41)
Albumin: 4.1 g/dL (ref 3.5–5.0)
Alkaline Phosphatase: 67 U/L (ref 38–126)
Anion gap: 7 (ref 5–15)
BUN: 17 mg/dL (ref 6–20)
CO2: 23 mmol/L (ref 22–32)
Calcium: 9.2 mg/dL (ref 8.9–10.3)
Chloride: 108 mmol/L (ref 101–111)
Creatinine, Ser: 0.86 mg/dL (ref 0.44–1.00)
GFR calc Af Amer: >60 mL/min (ref >60)
GFR calc non Af Amer: >60 mL/min (ref >60)
Glucose, Bld: 134 mg/dL — ABNORMAL HIGH (ref 65–99)
Potassium: 3.3 mmol/L — ABNORMAL LOW (ref 3.5–5.1)
Sodium: 138 mmol/L (ref 135–145)
Total Bilirubin: 0.6 mg/dL (ref 0.3–1.2)
Total Protein: 7.5 g/dL (ref 6.5–8.1)

## 2017-08-16 LAB — CBC WITH DIFFERENTIAL/PLATELET
Basophils Absolute: 0.1 10*3/uL (ref 0–0.1)
Basophils Relative: 1 %
Eosinophils Absolute: 0.1 10*3/uL (ref 0–0.7)
Eosinophils Relative: 2 %
HCT: 38.4 % (ref 35.0–47.0)
Hemoglobin: 13.1 g/dL (ref 12.0–16.0)
Lymphocytes Relative: 31 %
Lymphs Abs: 2 10*3/uL (ref 1.0–3.6)
MCH: 31.8 pg (ref 26.0–34.0)
MCHC: 34.2 g/dL (ref 32.0–36.0)
MCV: 93.1 fL (ref 80.0–100.0)
Monocytes Absolute: 0.6 10*3/uL (ref 0.2–0.9)
Monocytes Relative: 9 %
Neutro Abs: 3.8 10*3/uL (ref 1.4–6.5)
Neutrophils Relative %: 57 %
Platelets: 238 10*3/uL (ref 150–440)
RBC: 4.13 MIL/uL (ref 3.80–5.20)
RDW: 14.5 % (ref 11.5–14.5)
WBC: 6.6 10*3/uL (ref 3.6–11.0)

## 2017-08-16 LAB — TROPONIN I: Troponin I: <0.03 ng/mL (ref <0.03)

## 2017-08-16 LAB — LIPASE, BLOOD: Lipase: 34 U/L (ref 11–51)

## 2017-08-16 MED ORDER — GI COCKTAIL ~~LOC~~
30.0000 mL | Freq: Once | ORAL | Status: DC
  Filled 2017-08-16: qty 30

## 2017-08-16 NOTE — Discharge Instructions (Signed)
Fortunately today your blood work was reassuring, however to make sure you did not have a heart attack which have required a second blood test.  My recommendation was for you to stay and make sure your heart was safe, and please know that you are more than welcome to come back to our hospital at any point and we are more than happy to continue your care.  Otherwise please follow-up with your cardiologist this coming Tuesday for a recheck.  It was a pleasure to take care of you today, and thank you for coming to our emergency department.  If you have any questions or concerns before leaving please ask the nurse to grab me and I'm more than happy to go through your aftercare instructions again.  If you were prescribed any opioid pain medication today such as Norco, Vicodin, Percocet, morphine, hydrocodone, or oxycodone please make sure you do not drive when you are taking this medication as it can alter your ability to drive safely.  If you have any concerns once you are home that you are not improving or are in fact getting worse before you can make it to your follow-up appointment, please do not hesitate to call 911 and come back for further evaluation.  Darel Hong, MD  Results for orders placed or performed during the hospital encounter of 08/16/17  Comprehensive metabolic panel  Result Value Ref Range   Sodium 138 135 - 145 mmol/L   Potassium 3.3 (L) 3.5 - 5.1 mmol/L   Chloride 108 101 - 111 mmol/L   CO2 23 22 - 32 mmol/L   Glucose, Bld 134 (H) 65 - 99 mg/dL   BUN 17 6 - 20 mg/dL   Creatinine, Ser 0.86 0.44 - 1.00 mg/dL   Calcium 9.2 8.9 - 10.3 mg/dL   Total Protein 7.5 6.5 - 8.1 g/dL   Albumin 4.1 3.5 - 5.0 g/dL   AST 25 15 - 41 U/L   ALT 18 14 - 54 U/L   Alkaline Phosphatase 67 38 - 126 U/L   Total Bilirubin 0.6 0.3 - 1.2 mg/dL   GFR calc non Af Amer >60 >60 mL/min   GFR calc Af Amer >60 >60 mL/min   Anion gap 7 5 - 15  Lipase, blood  Result Value Ref Range   Lipase 34 11 - 51  U/L  Troponin I  Result Value Ref Range   Troponin I <0.03 <0.03 ng/mL  CBC with Differential  Result Value Ref Range   WBC 6.6 3.6 - 11.0 K/uL   RBC 4.13 3.80 - 5.20 MIL/uL   Hemoglobin 13.1 12.0 - 16.0 g/dL   HCT 38.4 35.0 - 47.0 %   MCV 93.1 80.0 - 100.0 fL   MCH 31.8 26.0 - 34.0 pg   MCHC 34.2 32.0 - 36.0 g/dL   RDW 14.5 11.5 - 14.5 %   Platelets 238 150 - 440 K/uL   Neutrophils Relative % 57 %   Neutro Abs 3.8 1.4 - 6.5 K/uL   Lymphocytes Relative 31 %   Lymphs Abs 2.0 1.0 - 3.6 K/uL   Monocytes Relative 9 %   Monocytes Absolute 0.6 0.2 - 0.9 K/uL   Eosinophils Relative 2 %   Eosinophils Absolute 0.1 0 - 0.7 K/uL   Basophils Relative 1 %   Basophils Absolute 0.1 0 - 0.1 K/uL   Dg Chest 2 View  Result Date: 08/16/2017 CLINICAL DATA:  Burning sensation in the chest over the last several days. EXAM: CHEST  2 VIEW COMPARISON:  07/10/2017 FINDINGS: Chronic left ventricular prominence. Unfolded aorta. Surgical clips in the mediastinum. Slight worsening of patchy atelectasis in both lower lobes. Mild pneumonia not excluded. No visible effusion. Chronic degenerative changes affect the spine. IMPRESSION: Worsened patchy density in both lower lungs that could be atelectasis or mild pneumonia. Electronically Signed   By: [name] Chimes M.D.   On: 08/16/2017 07:51

## 2017-08-16 NOTE — ED Provider Notes (Signed)
[facility] Emergency Department Provider Note  ____________________________________________   First MD Initiated Contact with Patient 08/16/17 [phone]     (approximate)  I have reviewed the triage vital signs and the nursing notes.   HISTORY  Chief Complaint Chest Pain (Warm discomfort and burning in mid chest)    HPI MERCY LEPPLA is a 66 y.o. female who presents to the emergency department via EMS with 3-4 days of atypical chest pain.  Her pain feels like "burning" in the center of her chest.  It lasts 2-3 seconds at a time.  The pain is mild to moderate severity. it is not associated with nausea.  It is nonradiating.  She has no diaphoresis when it occurs.  It is nonexertional.  She has no history of deep vein thrombosis or pulmonary embolism.  She sleeps on one pillow and is able to lie completely flat.  She has had no recent surgery or immobilization.   11/03/15 Echo: Study Conclusions  - Left ventricle: The cavity size was normal. Systolic function was   normal. The estimated ejection fraction was in the range of 60%   to 65%. Wall motion was normal; there were no regional wall   motion abnormalities. Doppler parameters are consistent with   abnormal left ventricular relaxation (grade 1 diastolic   dysfunction). - Mitral valve: There was mild regurgitation. - Left atrium: The atrium was normal in size. - Right ventricle: Systolic function was normal. - Pulmonary arteries: Systolic pressure was within the normal   range.   Past Medical History:  Diagnosis Date  . Anxiety   . Diabetes mellitus   . Fibromyalgia   . GERD (gastroesophageal reflux disease)   . Heart murmur   . Herniated disc   . Hyperlipidemia   . Hypertension   . Lack of bladder control   . Lung tumor   . Rheumatoid arthritis ([place])   . Schizoaffective disorder ([place])   . 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 last seizure 2013!  [name] Sleep apnea     Patient Active  Problem List   Diagnosis Date Noted  . Screening for cervical cancer 07/29/2017  . 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 Gravida 1 10/26/2015  . Dentagra 10/26/2015  . Itch of skin 10/26/2015  . Sex counseling 10/26/2015  . Excessive sweating 07/05/2015  . Acid reflux 06/29/2015  . Encounter for pre-employment examination 06/29/2015  . Diabetes mellitus type 2, controlled, without complications ([place]) 41/66/0630  . Cardiac murmur 06/29/2015  . Arthritis of knee, degenerative 06/28/2015  . Stiffness of both knees 06/22/2015  . Insomnia w/ sleep apnea 03/21/2015  . Anxiety disorder 03/21/2015  . Hypertension 03/21/2015  . HLD (hyperlipidemia) 03/21/2015  . Paranoid schizophrenia ([place]) 03/21/2015  . Urge incontinence 03/21/2015  . Breathlessness on exertion 02/02/2014  . Awareness of heartbeats 02/02/2014  . Apnea, sleep 02/02/2014    Past Surgical History:  Procedure Laterality Date  . BRAIN TUMOR EXCISION  2012   benign  . CARDIAC CATHETERIZATION  2008  . CESAREAN SECTION    . CYST REMOVAL HAND    . LUNG LOBECTOMY  1977   benign tumor  . TOTAL KNEE ARTHROPLASTY Left 01/28/2017   Procedure: TOTAL KNEE ARTHROPLASTY;  Surgeon: Corky Mull, MD;  Location: [facility] ORS;  Service: Orthopedics;  Laterality: Left;    Prior to Admission medications   Medication Sig Start Date End Date Taking? Authorizing Provider  aspirin EC 81  MG tablet Take 81 mg by mouth daily before breakfast.     [provider]  atenolol (TENORMIN) 100 MG tablet TAKE 1 TABLET BY MOUTH  DAILY 03/17/17   Lada, Satira Anis, MD  Bioflavonoid Products (VITAMIN C) CHEW Chew 1 tablet by mouth daily as needed (for immune system support).    [provider]  Capsaicin (ZOSTRIX EX) Apply 1 application topically 4 (four) times daily as needed (for knee pain.).    [provider]  ibuprofen (ADVIL,MOTRIN) 200 MG tablet Take 600 mg by mouth every 8 (eight) hours as needed  (for knee pain.).    [provider]  lisinopril-hydrochlorothiazide (PRINZIDE,ZESTORETIC) 20-25 MG tablet TAKE 1 TABLET BY MOUTH  DAILY 07/08/17   Roselee Nova, MD  [facility] LOTN Apply 1 application topically 4 (four) times daily as needed (for knee pain.). Two Old Goats Essential Lotion (Aloe Vera, [employer], and 6 natural anti-inflammatory essential oils; [place] Chamomile, [place], [place], [place], Peppermint and [employer])    [provider]  metFORMIN (GLUCOPHAGE) 500 MG tablet TAKE 1 TABLET BY MOUTH TWICE DAILY WITH A MEAL 01/27/17   Roselee Nova, MD  Multiple Vitamin (MULTIVITAMIN WITH MINERALS) TABS tablet Take 1 tablet by mouth daily before breakfast. Women's 50+    [provider]  omeprazole (PRILOSEC) 20 MG capsule TAKE 1 CAPSULE BY MOUTH  DAILY 07/08/17   Roselee Nova, MD  QUEtiapine (SEROQUEL) 100 MG tablet Take 0.5 tablets (50 mg total) daily by mouth. Take 50 mg in the AM and start taking 2 tablets or 200 mg at bedtime 07/23/17   Ursula Alert, MD  rosuvastatin (CRESTOR) 40 MG tablet TAKE 1 TABLET BY MOUTH  DAILY 07/08/17   Roselee Nova, MD  sertraline (ZOLOFT) 50 MG tablet TAKE 1 TABLET BY MOUTH  DAILY 07/07/17   Elvin So, MD  traZODone (DESYREL) 50 MG tablet Take 1 tablet (50 mg total) at bedtime as needed by mouth for sleep. 07/23/17   Ursula Alert, MD    Allergies Percocet [oxycodone-acetaminophen]; Tramadol hcl; and Vicodin [hydrocodone-acetaminophen]  Family History  Problem Relation Age of Onset  . Heart disease Brother   . Depression Mother   . Heart attack Mother   . Stroke Mother   . Alcohol abuse Father   . Stroke Father   . Diabetes Sister   . Diabetes Sister   . Stomach cancer Sister   . Kidney disease Sister   . COPD Brother   . Lung cancer Brother   . Diabetes Brother     Social History Social History   Tobacco Use  . Smoking status: Never Smoker  . Smokeless tobacco: Never  Used  Substance Use Topics  . Alcohol use: No    Alcohol/week: 0.0 oz  . Drug use: No    Review of Systems Constitutional: No fever/chills Eyes: No visual changes. ENT: No sore throat. Cardiovascular: Positive for chest pain. Respiratory: Denies shortness of breath. Gastrointestinal: No abdominal pain.  No nausea, no vomiting.  No diarrhea.  No constipation. Genitourinary: Negative for dysuria. Musculoskeletal: Negative for back pain. Skin: Negative for rash. Neurological: Negative for headaches, focal weakness or numbness.   ____________________________________________   PHYSICAL EXAM:  VITAL SIGNS: ED Triage Vitals  Enc Vitals Group     BP      Pulse      Resp      Temp      Temp src  SpO2      Weight      Height      Head Circumference      Peak Flow      Pain Score      Pain Loc      Pain Edu?      Excl. in [place]?     Constitutional: Alert and oriented x4 intermittently cooperative and somewhat standoffish Eyes: PERRL EOMI. Head: Atraumatic. Nose: No congestion/rhinnorhea. Mouth/Throat: No trismus Neck: No stridor.   Cardiovascular: Normal rate, regular rhythm. Grossly normal heart sounds.  Good peripheral circulation. Respiratory: Normal respiratory effort.  No retractions. Lungs CTAB and moving good air Gastrointestinal: Obese soft nontender Musculoskeletal: No lower extremity edema   Neurologic:  Normal speech and language. No gross focal neurologic deficits are appreciated. Skin:  Skin is warm, dry and intact. No rash noted. Psychiatric: Somewhat strange affect    ____________________________________________   DIFFERENTIAL includes but not limited to  Acute coronary syndrome, pulmonary embolism, aortic dissection, pneumothorax, pneumonia, gastric reflux, costochondritis ____________________________________________   LABS (all labs ordered are listed, but only abnormal results are displayed)  Labs Reviewed  COMPREHENSIVE METABOLIC PANEL -  Abnormal; Notable for the following components:      Result Value   Potassium 3.3 (*)    Glucose, Bld 134 (*)    All other components within normal limits  LIPASE, BLOOD  TROPONIN I  CBC WITH DIFFERENTIAL/PLATELET    Blood work reviewed by me with no acute disease __________________________________________  EKG  ED ECG REPORT I, Darel Hong, the attending physician, personally viewed and interpreted this ECG.  Date: 08/16/2017 EKG Time:  Rate: 82 Rhythm: normal sinus rhythm QRS Axis: normal Intervals: normal ST/T Wave abnormalities: normal Narrative Interpretation: no evidence of acute ischemia  ____________________________________________  RADIOLOGY  Chest x-ray reviewed by me shows worsening bibasilar atelectasis. ____________________________________________   PROCEDURES  Procedure(s) performed: no  Procedures  Critical Care performed: no  Observation: no ____________________________________________   INITIAL IMPRESSION / ASSESSMENT AND PLAN / ED COURSE  Pertinent labs & imaging results that were available during my care of the patient were reviewed by me and considered in my medical decision making (see chart for details).  On arrival the patient is hemodynamically stable and well-appearing with somewhat atypical chest pain.  I explained to her that given her history however she would require at least 2 troponins and she verbalized understanding and agreement.      __----------------------------------------- 8:54 AM on 08/16/2017 -----------------------------------------  Fortunately the patient's first set of blood work was reassuring, however given her previous history I recommended a delta troponin.  Patient declined stating she would prefer to go home and that she would follow-up with her cardiologist this coming Tuesday for recheck.  I explained to the patient that she would be leaving [facility] because I could not adequately assess  whether or not she had a heart attack at this point.  While she does have a history of schizophrenia, she is clearly alert and oriented x4 right now and has capacity to make medical decisions.  The patient feels welcome to return to the emergency department for any issues prior to her appointment.  __________________________________________   FINAL CLINICAL IMPRESSION(S) / ED DIAGNOSES  Final diagnoses:  Chest pain, unspecified type      NEW MEDICATIONS STARTED DURING THIS VISIT:  This SmartLink is deprecated. Use AVSMEDLIST instead to display the medication list for a patient.   Note:  This document was prepared using Dragon  voice recognition software and may include unintentional dictation errors.     Darel Hong, MD 08/17/17 [phone]

## 2017-08-16 NOTE — ED Triage Notes (Signed)
EMS reports symptoms started 08/15/17 around noon, Hx Acid reflux.   Allergies Percocet, Vicodan, and Tramadol  Pt reports warm discomfort in mid chest

## 2017-08-16 NOTE — ED Notes (Addendum)
Patient fixated on her right hand where vein blew, insisting that it is a blood clot
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
## 2017-08-16 NOTE — ED Notes (Addendum)
Patient argumentative and uncooperative at times, and demanding

## 2017-08-16 NOTE — ED Notes (Signed)
Patient transported to X-ray 

## 2017-08-16 NOTE — ED Notes (Addendum)
Patient called her sister Orma Render, who stated she was on Haldol for ad Dx of paranoid schizophrenia. Patient's doctor took her of haldol and placed her on Seroquel, which the patient refused to take stating it makes her thinking cloudy. Patient asked her sister to come get her and take her to another hospital.

## 2017-08-18 DIAGNOSIS — G4733 Obstructive sleep apnea (adult) (pediatric): Secondary | ICD-10-CM | POA: Diagnosis not present

## 2017-08-25 ENCOUNTER — Other Ambulatory Visit: Payer: Self-pay

## 2017-08-25 ENCOUNTER — Encounter (HOSPITAL_COMMUNITY): Payer: Self-pay | Admitting: Emergency Medicine

## 2017-08-25 ENCOUNTER — Other Ambulatory Visit: Payer: Self-pay | Admitting: Family Medicine

## 2017-08-25 ENCOUNTER — Emergency Department (HOSPITAL_COMMUNITY)
Admission: EM | Admit: 2017-08-25 | Discharge: 2017-08-25 | Discharge status: Against Medical Advice | Payer: Medicare Other | Attending: Emergency Medicine | Admitting: Emergency Medicine

## 2017-08-25 DIAGNOSIS — Z5321 Procedure and treatment not carried out due to patient leaving prior to being seen by health care provider: Secondary | ICD-10-CM | POA: Diagnosis not present

## 2017-08-25 DIAGNOSIS — R102 Pelvic and perineal pain: Secondary | ICD-10-CM | POA: Insufficient documentation

## 2017-08-25 DIAGNOSIS — I10 Essential (primary) hypertension: Secondary | ICD-10-CM

## 2017-08-25 LAB — COMPREHENSIVE METABOLIC PANEL
ALT: 130 U/L — ABNORMAL HIGH (ref 14–54)
AST: 84 U/L — ABNORMAL HIGH (ref 15–41)
Albumin: 4.2 g/dL (ref 3.5–5.0)
Alkaline Phosphatase: 74 U/L (ref 38–126)
Anion gap: 11 (ref 5–15)
BUN: 8 mg/dL (ref 6–20)
CO2: 24 mmol/L (ref 22–32)
Calcium: 9.5 mg/dL (ref 8.9–10.3)
Chloride: 105 mmol/L (ref 101–111)
Creatinine, Ser: 0.87 mg/dL (ref 0.44–1.00)
GFR calc Af Amer: >60 mL/min (ref >60)
GFR calc non Af Amer: >60 mL/min (ref >60)
Glucose, Bld: 153 mg/dL — ABNORMAL HIGH (ref 65–99)
Potassium: 3.3 mmol/L — ABNORMAL LOW (ref 3.5–5.1)
Sodium: 140 mmol/L (ref 135–145)
Total Bilirubin: 1.1 mg/dL (ref 0.3–1.2)
Total Protein: 7.7 g/dL (ref 6.5–8.1)

## 2017-08-25 LAB — SALICYLATE LEVEL: Salicylate Lvl: <7 mg/dL (ref 2.8–30.0)

## 2017-08-25 LAB — CBC
HCT: 41.2 % (ref 36.0–46.0)
Hemoglobin: 14 g/dL (ref 12.0–15.0)
MCH: 31.2 pg (ref 26.0–34.0)
MCHC: 34 g/dL (ref 30.0–36.0)
MCV: 91.8 fL (ref 78.0–100.0)
Platelets: 275 10*3/uL (ref 150–400)
RBC: 4.49 MIL/uL (ref 3.87–5.11)
RDW: 14.2 % (ref 11.5–15.5)
WBC: 6.4 10*3/uL (ref 4.0–10.5)

## 2017-08-25 LAB — ACETAMINOPHEN LEVEL: Acetaminophen (Tylenol), Serum: <10 ug/mL — ABNORMAL LOW (ref 10–30)

## 2017-08-25 LAB — ETHANOL: Alcohol, Ethyl (B): <10 mg/dL (ref <10)

## 2017-08-25 NOTE — ED Notes (Signed)
Pt called for room, no answer.
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
## 2017-08-26 ENCOUNTER — Emergency Department (HOSPITAL_COMMUNITY)
Admission: EM | Admit: 2017-08-26 | Discharge: 2017-08-26 | Disposition: A | Discharge status: Home / Self Care | Payer: Medicare Other | Attending: Emergency Medicine | Admitting: Emergency Medicine

## 2017-08-26 ENCOUNTER — Encounter (HOSPITAL_COMMUNITY): Payer: Self-pay | Admitting: *Deleted

## 2017-08-26 DIAGNOSIS — F200 Paranoid schizophrenia: Secondary | ICD-10-CM | POA: Diagnosis not present

## 2017-08-26 DIAGNOSIS — Z79899 Other long term (current) drug therapy: Secondary | ICD-10-CM | POA: Insufficient documentation

## 2017-08-26 DIAGNOSIS — Z96652 Presence of left artificial knee joint: Secondary | ICD-10-CM | POA: Diagnosis not present

## 2017-08-26 DIAGNOSIS — R102 Pelvic and perineal pain unspecified side: Secondary | ICD-10-CM

## 2017-08-26 DIAGNOSIS — E119 Type 2 diabetes mellitus without complications: Secondary | ICD-10-CM | POA: Diagnosis not present

## 2017-08-26 DIAGNOSIS — I10 Essential (primary) hypertension: Secondary | ICD-10-CM | POA: Insufficient documentation

## 2017-08-26 DIAGNOSIS — Z7984 Long term (current) use of oral hypoglycemic drugs: Secondary | ICD-10-CM | POA: Diagnosis not present

## 2017-08-26 DIAGNOSIS — Z7982 Long term (current) use of aspirin: Secondary | ICD-10-CM | POA: Diagnosis not present
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
## 2017-08-26 NOTE — ED Notes (Signed)
Pt. Ambulated to bathroom with steady gait.  

## 2017-08-26 NOTE — ED Notes (Signed)
Pt. Refuses to be put on the monitor or to wear a gown. Will continue to monitor.
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
## 2017-08-26 NOTE — ED Notes (Signed)
No label on urine specimen, needs to be recollected

## 2017-09-03 ENCOUNTER — Other Ambulatory Visit: Payer: Self-pay | Admitting: Psychiatry

## 2017-09-05 ENCOUNTER — Other Ambulatory Visit: Payer: Self-pay | Admitting: Psychiatry

## 2017-09-05 ENCOUNTER — Telehealth: Payer: Self-pay | Admitting: Psychiatry

## 2017-09-05 DIAGNOSIS — F209 Schizophrenia, unspecified: Secondary | ICD-10-CM

## 2017-09-05 MED ORDER — HALOPERIDOL 5 MG PO TABS: 5.0000 mg | ORAL_TABLET | Freq: Every day | ORAL | 0 refills | Status: DC

## 2017-09-05 NOTE — Telephone Encounter (Signed)
Janett Billow CMA to call patient - Pt advised to come in for an appointment.

## 2017-09-05 NOTE — Telephone Encounter (Signed)
pls let pt know to come in for an appointment and could discuss possible change to haldol at that time. If she is decompensating or in a crisis , son can IVC her to the hospital/ED.

## 2017-09-05 NOTE — Telephone Encounter (Signed)
Pt has appt for the 09-11-17

## 2017-09-05 NOTE — Telephone Encounter (Signed)
pt son called states his mother is not doine well but she will not go to the doctor or er. pt keeps on that medication is not working and that she wants haldol.

## 2017-09-05 NOTE — Telephone Encounter (Signed)
Pt son was called and notified that haldol was called in and that pt is to stop the seroquel and to make sure to keep the appt on the 3rd.

## 2017-09-05 NOTE — Telephone Encounter (Signed)
Called [facility] , [place] , they say they do have haldol 5 mg available. Will restart Haldol 5 mg. Will advise her to stop seroquel. She will see Probation officer on January 3 rd and her dosage can be readjusted . Jessica CMA to call pt and let her know 5 mg script for haldol has been sent to [place] - [address].

## 2017-09-06 ENCOUNTER — Other Ambulatory Visit: Payer: Self-pay

## 2017-09-06 ENCOUNTER — Encounter: Payer: Self-pay | Admitting: Emergency Medicine

## 2017-09-06 ENCOUNTER — Emergency Department
Admission: EM | Admit: 2017-09-06 | Discharge: 2017-09-08 | Disposition: A | Discharge status: Other Acute Inpatient Hospital | Payer: Medicare Other | Attending: Student in an Organized Health Care Education/Training Program | Admitting: Student in an Organized Health Care Education/Training Program

## 2017-09-06 DIAGNOSIS — Z79899 Other long term (current) drug therapy: Secondary | ICD-10-CM | POA: Insufficient documentation

## 2017-09-06 DIAGNOSIS — F22 Delusional disorders: Secondary | ICD-10-CM

## 2017-09-06 DIAGNOSIS — E119 Type 2 diabetes mellitus without complications: Secondary | ICD-10-CM

## 2017-09-06 DIAGNOSIS — Z7982 Long term (current) use of aspirin: Secondary | ICD-10-CM | POA: Diagnosis not present

## 2017-09-06 DIAGNOSIS — E1169 Type 2 diabetes mellitus with other specified complication: Secondary | ICD-10-CM

## 2017-09-06 DIAGNOSIS — F259 Schizoaffective disorder, unspecified: Secondary | ICD-10-CM | POA: Insufficient documentation

## 2017-09-06 DIAGNOSIS — F419 Anxiety disorder, unspecified: Secondary | ICD-10-CM | POA: Insufficient documentation

## 2017-09-06 DIAGNOSIS — I10 Essential (primary) hypertension: Secondary | ICD-10-CM | POA: Diagnosis not present

## 2017-09-06 DIAGNOSIS — Z760 Encounter for issue of repeat prescription: Secondary | ICD-10-CM | POA: Diagnosis not present

## 2017-09-06 DIAGNOSIS — M179 Osteoarthritis of knee, unspecified: Secondary | ICD-10-CM | POA: Diagnosis present

## 2017-09-06 DIAGNOSIS — Z7984 Long term (current) use of oral hypoglycemic drugs: Secondary | ICD-10-CM | POA: Diagnosis not present

## 2017-09-06 DIAGNOSIS — E785 Hyperlipidemia, unspecified: Secondary | ICD-10-CM | POA: Diagnosis present

## 2017-09-06 DIAGNOSIS — Z885 Allergy status to narcotic agent status: Secondary | ICD-10-CM | POA: Diagnosis not present

## 2017-09-06 DIAGNOSIS — M1710 Unilateral primary osteoarthritis, unspecified knee: Secondary | ICD-10-CM | POA: Diagnosis present

## 2017-09-06 LAB — ETHANOL: Alcohol, Ethyl (B): <10 mg/dL (ref <10)

## 2017-09-06 LAB — COMPREHENSIVE METABOLIC PANEL
ALT: 24 U/L (ref 14–54)
AST: 30 U/L (ref 15–41)
Albumin: 4.6 g/dL (ref 3.5–5.0)
Alkaline Phosphatase: 70 U/L (ref 38–126)
Anion gap: 10 (ref 5–15)
BUN: 13 mg/dL (ref 6–20)
CO2: 24 mmol/L (ref 22–32)
Calcium: 10 mg/dL (ref 8.9–10.3)
Chloride: 104 mmol/L (ref 101–111)
Creatinine, Ser: 0.79 mg/dL (ref 0.44–1.00)
GFR calc Af Amer: >60 mL/min (ref >60)
GFR calc non Af Amer: >60 mL/min (ref >60)
Glucose, Bld: 110 mg/dL — ABNORMAL HIGH (ref 65–99)
Potassium: 3.6 mmol/L (ref 3.5–5.1)
Sodium: 138 mmol/L (ref 135–145)
Total Bilirubin: 1.2 mg/dL (ref 0.3–1.2)
Total Protein: 8.3 g/dL — ABNORMAL HIGH (ref 6.5–8.1)

## 2017-09-06 LAB — ACETAMINOPHEN LEVEL: Acetaminophen (Tylenol), Serum: <10 ug/mL — ABNORMAL LOW (ref 10–30)

## 2017-09-06 LAB — CBC
HCT: 43.6 % (ref 35.0–47.0)
Hemoglobin: 14.3 g/dL (ref 12.0–16.0)
MCH: 30.8 pg (ref 26.0–34.0)
MCHC: 32.7 g/dL (ref 32.0–36.0)
MCV: 94.3 fL (ref 80.0–100.0)
Platelets: 286 10*3/uL (ref 150–440)
RBC: 4.63 MIL/uL (ref 3.80–5.20)
RDW: 15.2 % — ABNORMAL HIGH (ref 11.5–14.5)
WBC: 5.1 10*3/uL (ref 3.6–11.0)

## 2017-09-06 LAB — SALICYLATE LEVEL: Salicylate Lvl: <7 mg/dL (ref 2.8–30.0)

## 2017-09-06 LAB — GLUCOSE, CAPILLARY: Glucose-Capillary: 92 mg/dL (ref 65–99)

## 2017-09-06 MED ORDER — METFORMIN HCL 500 MG PO TABS: 500.0000 mg | ORAL_TABLET | Freq: Every day | ORAL | Status: DC

## 2017-09-06 MED ORDER — LORAZEPAM 0.5 MG PO TABS: 0.5000 mg | ORAL_TABLET | Freq: Once | ORAL | Status: DC

## 2017-09-06 MED ORDER — ROSUVASTATIN CALCIUM 20 MG PO TABS
40.0000 mg | ORAL_TABLET | Freq: Every day | ORAL | Status: DC
  Administered 2017-09-06 – 2017-09-08 (×3): 40 mg via ORAL
  Filled 2017-09-06 (×3): qty 2

## 2017-09-06 MED ORDER — SERTRALINE HCL 50 MG PO TABS
50.0000 mg | ORAL_TABLET | Freq: Every day | ORAL | Status: DC
  Administered 2017-09-06 – 2017-09-08 (×3): 50 mg via ORAL
  Filled 2017-09-06 (×3): qty 1

## 2017-09-06 MED ORDER — LORAZEPAM 0.5 MG PO TABS
0.5000 mg | ORAL_TABLET | ORAL | Status: DC | PRN
  Administered 2017-09-06 – 2017-09-07 (×4): 0.5 mg via ORAL
  Filled 2017-09-06 (×4): qty 1

## 2017-09-06 MED ORDER — ASPIRIN EC 81 MG PO TBEC
81.0000 mg | DELAYED_RELEASE_TABLET | Freq: Every day | ORAL | Status: DC
  Administered 2017-09-07 – 2017-09-08 (×2): 81 mg via ORAL
  Filled 2017-09-06 (×2): qty 1

## 2017-09-06 MED ORDER — METFORMIN HCL 500 MG PO TABS
500.0000 mg | ORAL_TABLET | Freq: Two times a day (BID) | ORAL | Status: DC
  Administered 2017-09-06 – 2017-09-08 (×4): 500 mg via ORAL
  Filled 2017-09-06 (×4): qty 1

## 2017-09-06 MED ORDER — TRAZODONE HCL 50 MG PO TABS
50.0000 mg | ORAL_TABLET | Freq: Every evening | ORAL | Status: DC | PRN
  Administered 2017-09-06 – 2017-09-07 (×2): 50 mg via ORAL
  Filled 2017-09-06 (×2): qty 1

## 2017-09-06 MED ORDER — ATENOLOL 25 MG PO TABS
100.0000 mg | ORAL_TABLET | Freq: Every day | ORAL | Status: DC
  Administered 2017-09-06 – 2017-09-08 (×3): 100 mg via ORAL
  Filled 2017-09-06 (×3): qty 4

## 2017-09-06 MED ORDER — LISINOPRIL 20 MG PO TABS
20.0000 mg | ORAL_TABLET | Freq: Every day | ORAL | Status: DC
  Administered 2017-09-06 – 2017-09-08 (×3): 20 mg via ORAL
  Filled 2017-09-06 (×3): qty 1

## 2017-09-06 MED ORDER — HALOPERIDOL 5 MG PO TABS
5.0000 mg | ORAL_TABLET | Freq: Every day | ORAL | Status: DC
  Administered 2017-09-06 – 2017-09-07 (×2): 5 mg via ORAL
  Filled 2017-09-06 (×2): qty 1

## 2017-09-06 MED ORDER — HYDROCHLOROTHIAZIDE 25 MG PO TABS
25.0000 mg | ORAL_TABLET | Freq: Every day | ORAL | Status: DC
  Administered 2017-09-06 – 2017-09-08 (×3): 25 mg via ORAL
  Filled 2017-09-06 (×3): qty 1

## 2017-09-06 MED ORDER — LISINOPRIL-HYDROCHLOROTHIAZIDE 20-25 MG PO TABS: 1.0000 | ORAL_TABLET | Freq: Every day | ORAL | Status: DC

## 2017-09-06 MED ORDER — IBUPROFEN 600 MG PO TABS: 600.0000 mg | ORAL_TABLET | Freq: Three times a day (TID) | ORAL | Status: DC | PRN

## 2017-09-06 NOTE — ED Notes (Signed)
Pt. Will wonder around area and sometimes walk into other opened rooms.  Pt. Directed to her room.  To aid patient, patients first name put on patient's room door.

## 2017-09-06 NOTE — ED Notes (Signed)
Pt. Up to nursing station.  When this writer asks patient "How can I help you".  Pt. Takes a little while to answer.  Pt. Sometimes can't remember what she wants to ask.  Pt. Will sometimes ask "What is the weather outside?", pt. Satisfied with answer.  Pt. Reminded that it is late and she should get some sleep.  Pt. Is easily redirected, but continues to be restless.  Pt. Currently sitting in bed.

## 2017-09-06 NOTE — ED Notes (Signed)
Pt. Appears to try to communicate with camera in room.
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
## 2017-09-06 NOTE — ED Notes (Addendum)
Pt at [facility] door trying to pull open door. RN had patient sit down in dayroom. "My nerves are rattled."  Patient told to slow her breathing.  EDP notified. PRN medication ordered. Maintained on 15 minute checks and observation by security camera for safety.

## 2017-09-06 NOTE — ED Notes (Signed)
Pt complaint with medications. Pt denies SI/HI and AVH. Pt answered questions appropriately, but slow to respond.  Pt cooperative with staff. Maintained on 15 minute checks and observation by security camera for safety.

## 2017-09-06 NOTE — ED Triage Notes (Signed)
FIRST NURSE NOTE-c/o delusions. NAD. Pt alert and talking to RN
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
## 2017-09-06 NOTE — ED Notes (Signed)
Patient resting quietly in room. No noted distress or abnormal behaviors noted. Will continue 15 minute checks and observation by security camera for safety. 

## 2017-09-06 NOTE — ED Notes (Signed)
IVC/SOC ordered and called

## 2017-09-07 NOTE — ED Notes (Signed)
Pt. Cleaning toilet and sink.  Pt. Cleaned up in bathroom and returned toothbrush and toothpaste to this Probation officer.

## 2017-09-07 NOTE — ED Notes (Signed)
Patient received PM snack. 

## 2017-09-07 NOTE — ED Notes (Signed)
Pt. Up and at nursing requesting some coffee.  Pt. Told it was after 2 am and we could offer other drinks.  Pt. Stated she would like apple juice and was given apple juice.  Pt. Appeared satisfied and walked with a steady gait back to room.

## 2017-09-07 NOTE — ED Notes (Signed)
Pt. Came to nursing station and asked for toothbrush and wash cloth.  Pt. Was asked if she wanted toothpaste to brush teeth and patient stated (after long pause) "yes".  Pt. Given toothpaste.   Pt. Brushing teeth and cleaning face with wash cloth.

## 2017-09-07 NOTE — ED Notes (Signed)
Pt. Standing at sink in bathroom.  Pt. Asked if she needed anything, Pt. Replied that she was "ok".

## 2017-09-07 NOTE — ED Notes (Signed)
Report was received from Lavella Hammock., RN; Pt. Verbalizes  complaints of confusion; with a H/O Schizophrenia; denies S.I./Hi. Continue to monitor with 15 min. Monitoring.

## 2017-09-07 NOTE — ED Notes (Signed)
ED BHU [place] Is the patient under IVC or is there intent for IVC: Yes.   Is the patient medically cleared: Yes.   Is there vacancy in the ED BHU: Yes.   Is the population mix appropriate for patient: Yes.   Is the patient awaiting placement in inpatient or outpatient setting: Yes.   Has the patient had a psychiatric consult: Yes.   Survey of unit performed for contraband, proper placement and condition of furniture, tampering with fixtures in bathroom, shower, and each patient room: Yes.   APPEARANCE/BEHAVIOR calm and adequate rapport can be established NEURO ASSESSMENT Orientation: person Hallucinations: Yes.  Auditory Hallucinations and Visual Hallucinations Speech: Normal Gait: normal RESPIRATORY ASSESSMENT Normal expansion.  Clear to auscultation.  No rales, rhonchi, or wheezing. CARDIOVASCULAR ASSESSMENT regular rate and rhythm, S1, S2 normal, no murmur, click, rub or gallop GASTROINTESTINAL ASSESSMENT soft, nontender, BS WNL, no r/g EXTREMITIES normal strength, tone, and muscle mass PLAN OF CARE Provide calm/safe environment. Vital signs assessed twice daily. ED BHU Assessment once each 12-hour shift. Collaborate with intake RN daily or as condition indicates. Assure the ED provider has rounded once each shift. Provide and encourage hygiene. Provide redirection as needed. Assess for escalating behavior; address immediately and inform ED provider.  Assess family dynamic and appropriateness for visitation as needed: Yes.   Educate the patient/family about BHU procedures/visitation: Yes.

## 2017-09-07 NOTE — ED Notes (Signed)
Pt. Up and sitting on edge of bed.

## 2017-09-07 NOTE — ED Notes (Signed)
Pt. Requesting Arlys John version of the bible.  Pt. Told we will try to locate.

## 2017-09-07 NOTE — ED Notes (Signed)
Pt. Is up and pacing in room.

## 2017-09-07 NOTE — ED Notes (Signed)
Patient denies pain and is resting comfortably.  

## 2017-09-07 NOTE — ED Provider Notes (Signed)
-----------------------------------------   8:35 AM on 09/07/2017 -----------------------------------------   Blood pressure (!) 175/84, pulse 84, temperature 97.9 F (36.6 C), temperature source Axillary, resp. rate 16, height 5\' 4"  (1.626 m), weight 115.7 kg (255 lb), SpO2 98 %.  The patient had no acute events since last update.  Medical workup has been largely nonrevealing.  Disposition is pending Psychiatry/Behavioral Medicine team recommendations.     Harvest Dark, MD 09/07/17 [phone]

## 2017-09-08 ENCOUNTER — Inpatient Hospital Stay
Admission: AD | Admit: 2017-09-08 | Discharge: 2017-09-19 | DRG: 885 | Disposition: A | Discharge status: Home / Self Care | Payer: Medicare Other | Attending: Psychiatry | Admitting: Psychiatry

## 2017-09-08 ENCOUNTER — Other Ambulatory Visit: Payer: Self-pay

## 2017-09-08 DIAGNOSIS — E785 Hyperlipidemia, unspecified: Secondary | ICD-10-CM | POA: Diagnosis present

## 2017-09-08 DIAGNOSIS — M797 Fibromyalgia: Secondary | ICD-10-CM | POA: Diagnosis present

## 2017-09-08 DIAGNOSIS — E1169 Type 2 diabetes mellitus with other specified complication: Secondary | ICD-10-CM

## 2017-09-08 DIAGNOSIS — Z9119 Patient's noncompliance with other medical treatment and regimen: Secondary | ICD-10-CM

## 2017-09-08 DIAGNOSIS — Z96652 Presence of left artificial knee joint: Secondary | ICD-10-CM | POA: Diagnosis present

## 2017-09-08 DIAGNOSIS — E669 Obesity, unspecified: Secondary | ICD-10-CM | POA: Diagnosis present

## 2017-09-08 DIAGNOSIS — Z7984 Long term (current) use of oral hypoglycemic drugs: Secondary | ICD-10-CM | POA: Diagnosis not present

## 2017-09-08 DIAGNOSIS — E876 Hypokalemia: Secondary | ICD-10-CM | POA: Diagnosis not present

## 2017-09-08 DIAGNOSIS — G4730 Sleep apnea, unspecified: Secondary | ICD-10-CM | POA: Diagnosis present

## 2017-09-08 DIAGNOSIS — Z79899 Other long term (current) drug therapy: Secondary | ICD-10-CM | POA: Diagnosis not present

## 2017-09-08 DIAGNOSIS — Z6841 Body Mass Index (BMI) 40.0 and over, adult: Secondary | ICD-10-CM

## 2017-09-08 DIAGNOSIS — Z885 Allergy status to narcotic agent status: Secondary | ICD-10-CM | POA: Diagnosis not present

## 2017-09-08 DIAGNOSIS — Z7982 Long term (current) use of aspirin: Secondary | ICD-10-CM | POA: Diagnosis not present

## 2017-09-08 DIAGNOSIS — I10 Essential (primary) hypertension: Secondary | ICD-10-CM | POA: Diagnosis not present

## 2017-09-08 DIAGNOSIS — E119 Type 2 diabetes mellitus without complications: Secondary | ICD-10-CM | POA: Diagnosis not present

## 2017-09-08 DIAGNOSIS — F200 Paranoid schizophrenia: Secondary | ICD-10-CM

## 2017-09-08 DIAGNOSIS — M069 Rheumatoid arthritis, unspecified: Secondary | ICD-10-CM | POA: Diagnosis present

## 2017-09-08 DIAGNOSIS — K219 Gastro-esophageal reflux disease without esophagitis: Secondary | ICD-10-CM | POA: Diagnosis present

## 2017-09-08 DIAGNOSIS — E860 Dehydration: Secondary | ICD-10-CM | POA: Diagnosis present

## 2017-09-08 DIAGNOSIS — R112 Nausea with vomiting, unspecified: Secondary | ICD-10-CM | POA: Diagnosis present

## 2017-09-08 DIAGNOSIS — F22 Delusional disorders: Secondary | ICD-10-CM | POA: Diagnosis present

## 2017-09-08 DIAGNOSIS — G4700 Insomnia, unspecified: Secondary | ICD-10-CM | POA: Diagnosis present

## 2017-09-08 DIAGNOSIS — R1110 Vomiting, unspecified: Secondary | ICD-10-CM

## 2017-09-08 DIAGNOSIS — F203 Undifferentiated schizophrenia: Secondary | ICD-10-CM | POA: Diagnosis not present

## 2017-09-08 DIAGNOSIS — F419 Anxiety disorder, unspecified: Secondary | ICD-10-CM | POA: Diagnosis present

## 2017-09-08 DIAGNOSIS — Z818 Family history of other mental and behavioral disorders: Secondary | ICD-10-CM | POA: Diagnosis not present

## 2017-09-08 DIAGNOSIS — Z760 Encounter for issue of repeat prescription: Secondary | ICD-10-CM | POA: Diagnosis not present

## 2017-09-08 DIAGNOSIS — G4733 Obstructive sleep apnea (adult) (pediatric): Secondary | ICD-10-CM | POA: Diagnosis present

## 2017-09-08 LAB — URINE DRUG SCREEN, QUALITATIVE (ARMC ONLY)
Amphetamines, Ur Screen: NOT DETECTED
Barbiturates, Ur Screen: NOT DETECTED
Benzodiazepine, Ur Scrn: POSITIVE — AB
Cannabinoid 50 Ng, Ur ~~LOC~~: NOT DETECTED
Cocaine Metabolite,Ur ~~LOC~~: NOT DETECTED
MDMA (Ecstasy)Ur Screen: NOT DETECTED
Methadone Scn, Ur: NOT DETECTED
Opiate, Ur Screen: NOT DETECTED
Phencyclidine (PCP) Ur S: NOT DETECTED
Tricyclic, Ur Screen: NOT DETECTED

## 2017-09-08 MED ORDER — MAGNESIUM HYDROXIDE 400 MG/5ML PO SUSP: 30.0000 mL | Freq: Every day | ORAL | Status: DC | PRN

## 2017-09-08 MED ORDER — ASPIRIN EC 81 MG PO TBEC
81.0000 mg | DELAYED_RELEASE_TABLET | Freq: Every day | ORAL | Status: DC
  Administered 2017-09-09 – 2017-09-19 (×8): 81 mg via ORAL
  Filled 2017-09-08 (×11): qty 1

## 2017-09-08 MED ORDER — TRAZODONE HCL 50 MG PO TABS: 50.0000 mg | ORAL_TABLET | Freq: Every evening | ORAL | Status: DC | PRN

## 2017-09-08 MED ORDER — SERTRALINE HCL 25 MG PO TABS
50.0000 mg | ORAL_TABLET | Freq: Every day | ORAL | Status: DC
  Administered 2017-09-09 – 2017-09-13 (×5): 50 mg via ORAL
  Filled 2017-09-08 (×9): qty 2

## 2017-09-08 MED ORDER — ATENOLOL 100 MG PO TABS
100.0000 mg | ORAL_TABLET | Freq: Every day | ORAL | Status: DC
  Administered 2017-09-09 – 2017-09-12 (×4): 100 mg via ORAL
  Filled 2017-09-08 (×8): qty 1

## 2017-09-08 MED ORDER — TRAZODONE HCL 100 MG PO TABS: 100.0000 mg | ORAL_TABLET | Freq: Every evening | ORAL | Status: DC | PRN

## 2017-09-08 MED ORDER — HYDROXYZINE HCL 25 MG PO TABS: 25.0000 mg | ORAL_TABLET | Freq: Three times a day (TID) | ORAL | Status: DC | PRN

## 2017-09-08 MED ORDER — HALOPERIDOL 5 MG PO TABS
5.0000 mg | ORAL_TABLET | Freq: Every day | ORAL | Status: DC
  Administered 2017-09-08 – 2017-09-11 (×4): 5 mg via ORAL
  Filled 2017-09-08 (×4): qty 1

## 2017-09-08 MED ORDER — ALUM & MAG HYDROXIDE-SIMETH 200-200-20 MG/5ML PO SUSP
30.0000 mL | ORAL | Status: DC | PRN
  Administered 2017-09-11 – 2017-09-18 (×3): 30 mL via ORAL
  Filled 2017-09-08 (×2): qty 30

## 2017-09-08 MED ORDER — ACETAMINOPHEN 325 MG PO TABS: 650.0000 mg | ORAL_TABLET | Freq: Four times a day (QID) | ORAL | Status: DC | PRN

## 2017-09-08 MED ORDER — ROSUVASTATIN CALCIUM 20 MG PO TABS
40.0000 mg | ORAL_TABLET | Freq: Every day | ORAL | Status: DC
  Administered 2017-09-09 – 2017-09-19 (×10): 40 mg via ORAL
  Filled 2017-09-08 (×14): qty 2

## 2017-09-08 MED ORDER — METFORMIN HCL 500 MG PO TABS
500.0000 mg | ORAL_TABLET | Freq: Two times a day (BID) | ORAL | Status: DC
  Administered 2017-09-08 – 2017-09-13 (×8): 500 mg via ORAL
  Filled 2017-09-08 (×12): qty 1

## 2017-09-08 MED ORDER — IBUPROFEN 600 MG PO TABS: 600.0000 mg | ORAL_TABLET | Freq: Three times a day (TID) | ORAL | Status: DC | PRN

## 2017-09-08 MED ORDER — HYDROCHLOROTHIAZIDE 25 MG PO TABS
25.0000 mg | ORAL_TABLET | Freq: Every day | ORAL | Status: DC
  Administered 2017-09-09 – 2017-09-12 (×4): 25 mg via ORAL
  Filled 2017-09-08 (×7): qty 1

## 2017-09-08 MED ORDER — LORAZEPAM 0.5 MG PO TABS: 0.5000 mg | ORAL_TABLET | ORAL | Status: DC | PRN

## 2017-09-08 MED ORDER — LISINOPRIL 20 MG PO TABS
20.0000 mg | ORAL_TABLET | Freq: Every day | ORAL | Status: DC
  Administered 2017-09-09 – 2017-09-12 (×4): 20 mg via ORAL
  Filled 2017-09-08 (×7): qty 1

## 2017-09-08 NOTE — ED Notes (Signed)
Pt c/o generalized body aches due to mattress. Another mattress applied to bed. PRN for pain offer pt refused. Will cont to monitor pt.

## 2017-09-08 NOTE — Consult Note (Signed)
[place] [facility] Consult   Reason for Consult: Consult for 66 year old woman with a history of schizophrenia came into the hospital because of worsening paranoia Referring Physician: Quentin Cornwall Patient Identification: DARLEN GLEDHILL MRN:  [id number] Principal Diagnosis: Paranoid schizophrenia [facility]) Diagnosis:   Patient Active Problem List   Diagnosis Date Noted  . Screening for cervical cancer [Z12.4] 07/29/2017  . 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 Gravida 1 [IMO0002] 10/26/2015  . Dentagra [K08.89] 10/26/2015  . Itch of skin [L29.9] 10/26/2015  . Sex counseling [Z70.9] 10/26/2015  . Excessive sweating [R61] 07/05/2015  . Acid reflux [K21.9] 06/29/2015  . Encounter for pre-employment examination [Z02.1] 06/29/2015  . Diabetes mellitus type 2, controlled, without complications ([place]) [W09.8] 06/29/2015  . Cardiac murmur [R01.1] 06/29/2015  . Arthritis of knee, degenerative [M17.10] 06/28/2015  . Stiffness of both knees [M25.661, M25.662] 06/22/2015  . Insomnia w/ sleep apnea [G47.00, G47.30] 03/21/2015  . Anxiety disorder [F41.9] 03/21/2015  . Hypertension [I10] 03/21/2015  . HLD (hyperlipidemia) [E78.5] 03/21/2015  . Paranoid schizophrenia ([place]) [F20.0] 03/21/2015  . Urge incontinence [N39.41] 03/21/2015  . Breathlessness on exertion [R06.81] 02/02/2014  . Awareness of heartbeats [R00.2] 02/02/2014  . Apnea, sleep [G47.30] 02/02/2014    Total Time spent with patient: 1 hour  Subjective:   DISHA COTTAM is a 66 y.o. female patient admitted with "I am not doing all right" patient interviewed chart reviewed.  66 year old woman with a long history of schizophrenia.  Patient is not a very clear historian right now but we have good old records  HPI: On her.  65 year old woman with a long history of schizophrenia.  Patient is not a very clear historian but records are pretty clear.  Patient came into the emergency room  because of worsening paranoia with poor functioning at home.  She was able to tell me that she had jammed a chair underneath the door knob of her bedroom in order to keep evil spirits out.  Sounds like she has been getting very anxious and confused and agitated.  She has pretty severe thought blocking during the interview.  She does not report any suicidal or homicidal thoughts.  Has not been drinking or using any drugs.  Looking back over her recent outpatient notes it sounds like the patient was transiently off of her haloperidol because the pharmacy was supposedly out of it.  During that time it sounds like she was switched over to may be only taking Seroquel.  She found the Seroquel far too sedating and was probably not compliant with that and so she decompensated.  Social history: Patient has a family home that she stays in and she has a son who stays with her.  It has been her contention in the past that the son is not particularly helpful for her although it does not sound like he is abusive.  She has talked in the past about wanting to him to move out.  She does have contact with other family members.  Medical history: Multiple medical problems including high blood pressure cholesterol diabetes arthritis.  Had a knee replacement within the last year.  Diabetes is usually pretty well controlled on oral medicine.  Substance abuse history: None  Past Psychiatric History: Patient has a long history of schizophrenia going back many years.  Her last hospitalization was probably almost 10 years ago.  Only suicide attempt was probably around that time as well.  Patient has been on several antipsychotics.  A  few years ago when I saw her she was on Geodon.  More recently it sounds like she was being maintained on Haldol but she does note that it causes her to have a tremor.  Not clear whether she really failed the Seroquel or just did not take it long enough or at a proper dose.  No history of  violence.  Risk to Self: Suicidal Ideation: No Suicidal Intent: No Is patient at risk for suicide?: No Suicidal Plan?: No Access to Means: No What has been your use of drugs/alcohol within the last 12 months?: None reported  How many times?: 0 Other Self Harm Risks: None reported  Triggers for Past Attempts: Other (Comment)(None reported ) Intentional Self Injurious Behavior: None Risk to Others: Homicidal Ideation: No Thoughts of Harm to Others: No Current Homicidal Intent: No Current Homicidal Plan: No Access to Homicidal Means: No Identified Victim: None reported  History of harm to others?: No Assessment of Violence: None Noted Violent Behavior Description: None reported  Does patient have access to weapons?: No Criminal Charges Pending?: No Does patient have a court date: No Prior Inpatient Therapy: Prior Inpatient Therapy: No Prior Therapy Dates: N/A Prior Therapy Facilty/Provider(s): N/A Reason for Treatment: N/A Prior Outpatient Therapy: Prior Outpatient Therapy: No Prior Therapy Dates: N/A Prior Therapy Facilty/Provider(s): N/A Reason for Treatment: N/A Does patient have an ACCT team?: No Does patient have Intensive In-House Services?  : No Does patient have [facility] services? : No Does patient have P4CC services?: No  Past Medical History:  Past Medical History:  Diagnosis Date  . Anxiety   . Diabetes mellitus   . Fibromyalgia   . GERD (gastroesophageal reflux disease)   . Heart murmur   . Herniated disc   . Hyperlipidemia   . Hypertension   . Lack of bladder control   . Lung tumor   . Rheumatoid arthritis ([place])   . Schizoaffective disorder ([place])   . Seizure [facility])    after brain surgery 2012. last seizure 2013!  [name] Sleep apnea     Past Surgical History:  Procedure Laterality Date  . BRAIN TUMOR EXCISION  2012   benign  . CARDIAC CATHETERIZATION  2008  . CESAREAN SECTION    . CYST REMOVAL HAND    . LUNG LOBECTOMY  1977   benign tumor  . TOTAL  KNEE ARTHROPLASTY Left 01/28/2017   Procedure: TOTAL KNEE ARTHROPLASTY;  Surgeon: Corky Mull, MD;  Location: [facility] ORS;  Service: Orthopedics;  Laterality: Left;   Family History:  Family History  Problem Relation Age of Onset  . Heart disease Brother   . Depression Mother   . Heart attack Mother   . Stroke Mother   . Alcohol abuse Father   . Stroke Father   . Diabetes Sister   . Diabetes Sister   . Stomach cancer Sister   . Kidney disease Sister   . COPD Brother   . Lung cancer Brother   . Diabetes Brother    Family Psychiatric  History: None known Social History:  Social History   Substance and Sexual Activity  Alcohol Use No  . Alcohol/week: 0.0 oz     Social History   Substance and Sexual Activity  Drug Use No    Social History   Socioeconomic History  . Marital status: Single    Spouse name: None  . Number of children: None  . Years of education: None  . Highest education level: None  Social Needs  .  Financial resource strain: Not hard at all  . Food insecurity - worry: Never true  . Food insecurity - inability: Never true  . Transportation needs - medical: No  . Transportation needs - non-medical: No  Occupational History  . None  Tobacco Use  . Smoking status: Never Smoker  . Smokeless tobacco: Never Used  Substance and Sexual Activity  . Alcohol use: No    Alcohol/week: 0.0 oz  . Drug use: No  . Sexual activity: No  Other Topics Concern  . None  Social History Narrative  . None   Additional Social History:    Allergies:   Allergies  Allergen Reactions  . Percocet [Oxycodone-Acetaminophen] Diarrhea, Nausea And Vomiting and Nausea Only  . Tramadol Hcl Diarrhea, Nausea And Vomiting and Nausea Only  . Vicodin [Hydrocodone-Acetaminophen] Diarrhea, Nausea And Vomiting and Nausea Only    Labs:  Results for orders placed or performed during the hospital encounter of 09/06/17 (from the past 48 hour(s))  Glucose, capillary     Status: None    Collection Time: 09/06/17  1:36 PM  Result Value Ref Range   Glucose-Capillary 92 65 - 99 mg/dL    Current Facility-Administered Medications  Medication Dose Route Frequency Provider Last Rate Last Dose  . aspirin EC tablet 81 mg  81 mg Oral QAC breakfast Merlyn Lot, MD   81 mg at 09/08/17 0900  . atenolol (TENORMIN) tablet 100 mg  100 mg Oral Daily Merlyn Lot, MD   100 mg at 09/08/17 6378  . haloperidol (HALDOL) tablet 5 mg  5 mg Oral QHS Merlyn Lot, MD   5 mg at 09/07/17 2139  . lisinopril (PRINIVIL,ZESTRIL) tablet 20 mg  20 mg Oral Daily Merlyn Lot, MD   20 mg at 09/08/17 5885   And  . hydrochlorothiazide (HYDRODIURIL) tablet 25 mg  25 mg Oral Daily Merlyn Lot, MD   25 mg at 09/08/17 0277  . ibuprofen (ADVIL,MOTRIN) tablet 600 mg  600 mg Oral Q8H PRN Merlyn Lot, MD      . LORazepam (ATIVAN) tablet 0.5 mg  0.5 mg Oral Q4H PRN Merlyn Lot, MD   0.5 mg at 09/07/17 2139  . metFORMIN (GLUCOPHAGE) tablet 500 mg  500 mg Oral BID WC Merlyn Lot, MD   500 mg at 09/08/17 0900  . rosuvastatin (CRESTOR) tablet 40 mg  40 mg Oral Daily Merlyn Lot, MD   40 mg at 09/08/17 4128  . sertraline (ZOLOFT) tablet 50 mg  50 mg Oral Daily Merlyn Lot, MD   50 mg at 09/08/17 7867  . traZODone (DESYREL) tablet 50 mg  50 mg Oral QHS PRN Merlyn Lot, MD   50 mg at 09/07/17 2140   Current Outpatient Medications  Medication Sig Dispense Refill  . aspirin EC 81 MG tablet Take 81 mg by mouth daily before breakfast.     . atenolol (TENORMIN) 100 MG tablet TAKE 1 TABLET BY MOUTH  DAILY 90 tablet 0  . Bioflavonoid Products (VITAMIN C) CHEW Chew 1 tablet by mouth daily as needed (for immune system support).    . Capsaicin (ZOSTRIX EX) Apply 1 application topically 4 (four) times daily as needed (for knee pain.).    [name] haloperidol (HALDOL) 5 MG tablet Take 1 tablet (5 mg total) by mouth at bedtime. 30 tablet 0  . ibuprofen (ADVIL,MOTRIN) 200 MG tablet  Take 600 mg by mouth every 8 (eight) hours as needed (for knee pain.).    [name] lisinopril-hydrochlorothiazide (PRINZIDE,ZESTORETIC) 20-25 MG tablet TAKE  1 TABLET BY MOUTH  DAILY 90 tablet 0  . Lotion Base LOTN Apply 1 application topically 4 (four) times daily as needed (for knee pain.). [employer] Essential Lotion (Aloe Vera, [employer], and 6 natural anti-inflammatory essential oils; [place], [place], [place], [place], Peppermint and Bier)    . metFORMIN (GLUCOPHAGE) 500 MG tablet TAKE 1 TABLET BY MOUTH TWICE DAILY WITH A MEAL 60 tablet 0  . omeprazole (PRILOSEC) 20 MG capsule TAKE 1 CAPSULE BY MOUTH  DAILY 90 capsule 0  . rosuvastatin (CRESTOR) 40 MG tablet TAKE 1 TABLET BY MOUTH  DAILY 90 tablet 0  . sertraline (ZOLOFT) 50 MG tablet TAKE 1 TABLET BY MOUTH  DAILY 90 tablet 0  . traZODone (DESYREL) 50 MG tablet Take 1 tablet (50 mg total) at bedtime as needed by mouth for sleep. 30 tablet 1    Musculoskeletal: Strength & Muscle Tone: within normal limits Gait & Station: normal Patient leans: N/A  Psychiatric Specialty Exam: Physical Exam  Nursing note and vitals reviewed. Constitutional: She appears well-developed and well-nourished.  HENT:  Head: Normocephalic and atraumatic.  Eyes: Conjunctivae are normal. Pupils are equal, round, and reactive to light.  Neck: Normal range of motion.  Cardiovascular: Regular rhythm and normal heart sounds.  Respiratory: Effort normal and breath sounds normal. No respiratory distress.  GI: Soft. She exhibits no distension.  Musculoskeletal: Normal range of motion.  Neurological: She is alert.  Skin: Skin is warm and dry.  Psychiatric: Judgment normal. Her mood appears anxious. Her affect is blunt. Her speech is delayed and tangential. She is slowed and withdrawn. Thought content is paranoid. Cognition and memory are impaired. She expresses no homicidal and no suicidal ideation.    Review of Systems  Constitutional:  Negative.   HENT: Negative.   Eyes: Negative.   Respiratory: Negative.   Cardiovascular: Negative.   Gastrointestinal: Negative.   Musculoskeletal: Negative.   Skin: Negative.   Neurological: Negative.   Psychiatric/Behavioral: Positive for hallucinations. Negative for depression, memory loss, substance abuse and suicidal ideas. The patient is nervous/anxious and has insomnia.     Blood pressure (!) 152/90, pulse 76, temperature 98.4 F (36.9 C), temperature source Oral, resp. rate 18, height 5\' 4"  (1.626 m), weight 255 lb (115.7 kg), SpO2 100 %.Body mass index is 43.77 kg/m.  General Appearance: Casual  Eye Contact:  Minimal  Speech:  Blocked and Slow  Volume:  Decreased  Mood:  Dysphoric  Affect:  Constricted  Thought Process:  Disorganized  Orientation:  Full (Time, Place, and Person)  Thought Content:  Illogical, Delusions and Paranoid Ideation  Suicidal Thoughts:  No  Homicidal Thoughts:  No  Memory:  Immediate;   Fair Recent;   Fair Remote;   Fair  Judgement:  Fair  Insight:  Fair  Psychomotor Activity:  Decreased  Concentration:  Concentration: Poor  Recall:  [employer] of Knowledge:  Fair  Language:  Good  Akathisia:  No  Handed:  Right  AIMS (if indicated):     Assets:  Desire for Improvement Financial Resources/Insurance Resilience Social Support  ADL's:  Impaired  Cognition:  Impaired,  Mild  Sleep:        Treatment Plan Summary: Daily contact with patient to assess and evaluate symptoms and progress in treatment, Medication management and Plan 66 year old woman with schizophrenia.  Although she does have a place to live and is not violent she is very thought blocked and appears to be unable to make any  decisions or to take care of herself right now.  Wisest and safest thing seems to be to admit her to the hospital while she is still psychotic to get her back on her medicine and stabilized before sending her home to where she is essentially taking care of  herself.  Patient is agreeable.  She was started back on 5 mg of haloperidol in the emergency room as well as her usual outpatient medicines.  Blood sugars are fine.  Blood pressure is still a little bit elevated.  No change to medication for now.  Full set of labs and EKG if not already done.  Admit to psychiatric ward.  Case reviewed with TTS  Disposition: Recommend psychiatric Inpatient admission when medically cleared. Supportive therapy provided about ongoing stressors.  Alethia Berthold, MD 09/08/2017 1:03 PM

## 2017-09-08 NOTE — ED Notes (Signed)
Pt has been calm and cooperative. Pt denies AH/VH/SI/HI at this time. Pt verbalized pain has been relieved by the second mattress. Pt was informed of transfer to unit downstairs. Pt verbalized understanding of d/c. All belongings will be transferred with pt. Report called given to Junita Push, Therapist, sports. Will cont to monitor pt.

## 2017-09-08 NOTE — BH Assessment (Signed)
Patient is to be admitted to [facility] by Dr. Weber Cooks.  Attending Physician will be Dr. Bary Leriche.   Patient has been assigned to room 304, by [place].   Intake Paper Work has been signed and placed on patient chart.  ER staff is aware of the admission [name], ER Sect.; Dr. Quentin Cornwall, ER MD; Amy, [place], Patient Access).

## 2017-09-08 NOTE — Progress Notes (Signed)
Patient is calm and cooperative during admission assessment. Patient denies SI/HI at this time. Patient denies AVH.Patient was shaking her arms states "when I feel frightened I shake my arms".Support and encouragement given. Patient informed of fall risk status, fall risk assessed "high" at this time. Patient oriented to unit/staff/room. Patient denies any questions/concerns at this time. Patient safe on unit with Q15 minute checks for safety. Skin assessment & body search done.No contraband found.

## 2017-09-08 NOTE — Tx Team (Signed)
Initial Treatment Plan 09/08/2017 4:47 PM EMERIE VANDERKOLK BPZ:[id number]    PATIENT STRESSORS: Health problems Medication change or noncompliance   PATIENT STRENGTHS: Average or above average intelligence Communication skills Motivation for treatment/growth   PATIENT IDENTIFIED PROBLEMS: Psychosis 09/08/2017  Anxiety 09/08/2017                   DISCHARGE CRITERIA:  Ability to meet basic life and health needs Adequate post-discharge living arrangements Verbal commitment to aftercare and medication compliance  PRELIMINARY DISCHARGE PLAN: Attend aftercare/continuing care group Return to previous living arrangement  PATIENT/FAMILY INVOLVEMENT: This treatment plan has been presented to and reviewed with the patient, LENDA BARATTA, and/or family member, .  The patient and family have been given the opportunity to ask questions and make suggestions.  Merlene Morse, RN 09/08/2017, 4:47 PM

## 2017-09-08 NOTE — ED Provider Notes (Signed)
-----------------------------------------   7:18 AM on 09/08/2017 -----------------------------------------   Blood pressure (!) 172/84, pulse 75, temperature (!) 97.5 F (36.4 C), temperature source Oral, resp. rate 18, height 5\' 4"  (1.626 m), weight 115.7 kg (255 lb), SpO2 98 %.  The patient had no acute events since last update.  Calm and cooperative at this time.  Disposition is pending Psychiatry/Behavioral Medicine team recommendations.     Merlyn Lot, MD 09/08/17 [phone]

## 2017-09-09 ENCOUNTER — Encounter: Payer: Self-pay | Admitting: Psychiatry

## 2017-09-09 DIAGNOSIS — F203 Undifferentiated schizophrenia: Secondary | ICD-10-CM

## 2017-09-09 LAB — LIPID PANEL
Cholesterol: 211 mg/dL — ABNORMAL HIGH (ref 0–200)
HDL: 38 mg/dL — ABNORMAL LOW (ref >40)
LDL Cholesterol: 153 mg/dL — ABNORMAL HIGH (ref 0–99)
Total CHOL/HDL Ratio: 5.6 RATIO
Triglycerides: 102 mg/dL (ref <150)
VLDL: 20 mg/dL (ref 0–40)

## 2017-09-09 LAB — HEMOGLOBIN A1C
Hgb A1c MFr Bld: 6.2 % — ABNORMAL HIGH (ref 4.8–5.6)
Mean Plasma Glucose: 131.24 mg/dL

## 2017-09-09 LAB — TSH: TSH: 1.426 u[IU]/mL (ref 0.350–4.500)

## 2017-09-09 NOTE — [facility] Group Notes (Signed)
09/09/2017 1PM  Type of Therapy/Topic:  Group Therapy:  Feelings about Diagnosis  Participation Level:  Did Not Attend   Description of Group:   This group will allow patients to explore their thoughts and feelings about diagnoses they have received. Patients will be guided to explore their level of understanding and acceptance of these diagnoses. Facilitator will encourage patients to process their thoughts and feelings about the reactions of others to their diagnosis and will guide patients in identifying ways to discuss their diagnosis with significant others in their lives. This group will be process-oriented, with patients participating in exploration of their own experiences, giving and receiving support, and processing challenge from other group members.   Therapeutic Goals: 1. Patient will demonstrate understanding of diagnosis as evidenced by identifying two or more symptoms of the disorder 2. Patient will be able to express two feelings regarding the diagnosis 3. Patient will demonstrate their ability to communicate their needs through discussion and/or role play  Summary of Patient Progress: Pt was invited to attend group but chose not to attend. CSW will continue to encourage pt to attend group throughout their admission.     Therapeutic Modalities:   Cognitive Behavioral Therapy Brief Therapy  Alden Hipp, MSW, LCSW 09/09/2017 2:01 PM

## 2017-09-09 NOTE — Progress Notes (Signed)
Patient ID: Lauren [name], female   DOB: [birth year], 67 y.o.   MRN: [id number] [place] on approach, A&Ox3, denied pain, isolative to room, mood and affect flat, labile, denied SI/HI/AVH, medication compliant.

## 2017-09-09 NOTE — Progress Notes (Signed)
Patient ID: Lauren [name], female   DOB: Jun 16, 1951, 67 y.o.   MRN: [id number] Disheveled, grumpy, reportedly having "gas" problem, in the bathroom for a long time, refused MOM as offered; denied SI/SIB/HI.

## 2017-09-09 NOTE — Progress Notes (Signed)
Patient refused dinner and evening medicine.When staff encouraged patient states "I can survive".Fluids offered.Patient refused.

## 2017-09-09 NOTE — Progress Notes (Signed)
Recreation Therapy Notes  Date: 01.01.2019   Time: 9:30 am   Location: Craft Room   Behavioral response: N/A   Intervention Topic: Goals   Discussion/Intervention: Patient did not attend group. Clinical Observations/Feedback:  Patient did not attend group. Rhyder Bratz LRT/CTRS         Yuritzy Zehring 09/09/2017 10:59 AM

## 2017-09-09 NOTE — [facility] Group Notes (Signed)
[place] Group Notes:  (Nursing/MHT/Case Management/Adjunct)  Date:  09/09/2017  Time:  9:05 PM  Type of Therapy:  Psychoeducational Skills  Participation Level:  Did Not Attend  Levonne Spiller 09/09/2017, 9:05 PM

## 2017-09-09 NOTE — Plan of Care (Signed)
Patient slept for Estimated Hours of 7.15; Precautionary checks every 15 minutes for safety maintained, room free of safety hazards, patient sustains no injury or falls during this shift.

## 2017-09-09 NOTE — Progress Notes (Signed)
Patient isolated in room.Affect is sad and tearful.Patient states "I am not going to hurt myself or anybody.The voices telling my mistakes I have done in the past."Patient refused to eat lunch.States "God is dealing with me.I can maintain."Patient talks slow and delay in response noted.Compliant with medications.Support and encouragement given.Safety maintained.

## 2017-09-09 NOTE — H&P (Signed)
Psychiatric Admission Assessment Adult  Patient Identification: Lauren [name] MRN:  [id number] Date of Evaluation:  09/09/2017 Chief Complaint:  Paranoid Principal Diagnosis: Schizophrenia, undifferentiated ([place]) Diagnosis:   Patient Active Problem List   Diagnosis Date Noted  . Schizophrenia, undifferentiated ([place]) [F20.3] 09/08/2017    Priority: High  . Screening for cervical cancer [Z12.4] 07/29/2017  . 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 Gravida 1 [IMO0002] 10/26/2015  . Dentagra [K08.89] 10/26/2015  . Itch of skin [L29.9] 10/26/2015  . Sex counseling [Z70.9] 10/26/2015  . Excessive sweating [R61] 07/05/2015  . Acid reflux [K21.9] 06/29/2015  . Encounter for pre-employment examination [Z02.1] 06/29/2015  . Diabetes mellitus type 2, controlled, without complications ([place]) [X32.4] 06/29/2015  . Cardiac murmur [R01.1] 06/29/2015  . Arthritis of knee, degenerative [M17.10] 06/28/2015  . Stiffness of both knees [M25.661, M25.662] 06/22/2015  . Insomnia w/ sleep apnea [G47.00, G47.30] 03/21/2015  . Anxiety disorder [F41.9] 03/21/2015  . Hypertension [I10] 03/21/2015  . HLD (hyperlipidemia) [E78.5] 03/21/2015  . 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 Urge incontinence [N39.41] 03/21/2015  . Breathlessness on exertion [R06.81] 02/02/2014  . Awareness of heartbeats [R00.2] 02/02/2014  . Apnea, sleep [G47.30] 02/02/2014   History of Present Illness:   Identifying data. Lauren, [name] is a 67 year old female with a history of schizophrenia.  Chief complaint. "Paranoia."  HIstory of present illness. Information was obtained from the patient and the chart. The patient was brought to the ER by her son for increased paranoia, disorganized thinking, deterioration of home functioning and agitation. She started experiencing "spirits" and had to barr her bedroom door for protection. It is not easy to piece up her story but aparently  the patient has been doing well for years on Haldol 6 mg. It was lowered to 4 mg several months ago and recently substituted with Seroquel due to tremor. The patient admits that she "did not trust her new doctor" and did not take Seroquel. During the interview, she is really struggling to answer my questions, her speech is slow and with great latency. She denies depressive symptoms or excessive anxiety. She denies drug or alcohol use.  Past psychiatric history. Long history of mental illness with multiple medication trials but the patient unable to name any except Seroquel. She was on Geodon few years back. She has never been on injectable. Her last hospitalization was probably 10 years ago. She reports one suicide attempt "a long time ago".   Family psychiatric history. Aunt with schizophrenia "a long time ago".   Social history. She is disabled from mental illness. She lives with her son and reports that this is a good arrangement.  Total Time spent with patient: 1 hour  Is the patient at risk to self? No.  Has the patient been a risk to self in the past 6 months? No.  Has the patient been a risk to self within the distant past? Yes.    Is the patient a risk to others? No.  Has the patient been a risk to others in the past 6 months? No.  Has the patient been a risk to others within the distant past? No.   Prior Inpatient Therapy:   Prior Outpatient Therapy:    Alcohol Screening: 1. How often do you have a drink containing alcohol?: Never 2. How many drinks containing alcohol do you have on a typical day when you are drinking?: 1 or 2 3. How often do you have six or more drinks  on one occasion?: Never AUDIT-C Score: 0 4. How often during the last year have you found that you were not able to stop drinking once you had started?: Never 5. How often during the last year have you failed to do what was normally expected from you becasue of drinking?: Never 6. How often during the last year have  you needed a first drink in the morning to get yourself going after a heavy drinking session?: Never 7. How often during the last year have you had a feeling of guilt of remorse after drinking?: Never 8. How often during the last year have you been unable to remember what happened the night before because you had been drinking?: Never 9. Have you or someone else been injured as a result of your drinking?: No 10. Has a relative or friend or a doctor or another health worker been concerned about your drinking or suggested you cut down?: No Alcohol Use Disorder Identification Test Final Score (AUDIT): 0 Intervention/Follow-up: AUDIT Score <7 follow-up not indicated Substance Abuse History in the last 12 months:  No. Consequences of Substance Abuse: NA Previous Psychotropic Medications: Yes  Psychological Evaluations: No  Past Medical History:  Past Medical History:  Diagnosis Date  . Anxiety   . Diabetes mellitus   . Fibromyalgia   . GERD (gastroesophageal reflux disease)   . Heart murmur   . Herniated disc   . Hyperlipidemia   . Hypertension   . Lack of bladder control   . Lung tumor   . Rheumatoid arthritis ([place])   . Schizoaffective disorder ([place])   . Seizure [facility])    after brain surgery 2012. last seizure 2013!  [name] Sleep apnea     Past Surgical History:  Procedure Laterality Date  . BRAIN TUMOR EXCISION  2012   benign  . CARDIAC CATHETERIZATION  2008  . CESAREAN SECTION    . CYST REMOVAL HAND    . LUNG LOBECTOMY  1977   benign tumor  . 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 Heart disease Brother   . Depression Mother   . Heart attack Mother   . Stroke Mother   . Alcohol abuse Father   . Stroke Father   . Diabetes Sister   . Diabetes Sister   . Stomach cancer Sister   .  Kidney disease Sister   . COPD Brother   . Lung cancer Brother   . Diabetes Brother    Tobacco Screening: Have you used any form of tobacco in the last 30 days? (Cigarettes, Smokeless Tobacco, Cigars, and/or Pipes): No Social History:  Social History   Substance and Sexual Activity  Alcohol Use No  . Alcohol/week: 0.0 oz     Social History   Substance and Sexual Activity  Drug Use No    Additional Social History:                           Allergies:   Allergies  Allergen Reactions  . Percocet [Oxycodone-Acetaminophen] Diarrhea, Nausea And Vomiting and Nausea Only  . Tramadol Hcl Diarrhea, Nausea And Vomiting and Nausea Only  . Vicodin [Hydrocodone-Acetaminophen] Diarrhea, Nausea And Vomiting and Nausea Only   Lab Results:  Results for orders placed or performed during the hospital  encounter of 09/08/17 (from the past 48 hour(s))  Urine Drug Screen, Qualitative     Status: Abnormal   Collection Time: 09/08/17  5:33 PM  Result Value Ref Range   Tricyclic, Ur Screen NONE DETECTED NONE DETECTED   Amphetamines, Ur Screen NONE DETECTED NONE DETECTED   MDMA (Ecstasy)Ur Screen NONE DETECTED NONE DETECTED   Cocaine Metabolite,Ur [place] NONE DETECTED NONE DETECTED   Opiate, Ur Screen NONE DETECTED NONE DETECTED   Phencyclidine (PCP) Ur S NONE DETECTED NONE DETECTED   Cannabinoid 50 Ng, Ur [place] NONE DETECTED NONE DETECTED   Barbiturates, Ur Screen NONE DETECTED NONE DETECTED   Benzodiazepine, Ur Scrn POSITIVE (A) NONE DETECTED   Methadone Scn, Ur NONE DETECTED NONE DETECTED    Comment: (NOTE) Tricyclics + metabolites, urine    Cutoff 1000 ng/mL Amphetamines + metabolites, urine  Cutoff 1000 ng/mL MDMA (Ecstasy), urine              Cutoff 500 ng/mL Cocaine Metabolite, urine          Cutoff 300 ng/mL Opiate + metabolites, urine        Cutoff 300 ng/mL Phencyclidine (PCP), urine         Cutoff 25 ng/mL Cannabinoid, urine                 Cutoff 50 ng/mL Barbiturates +  metabolites, urine  Cutoff 200 ng/mL Benzodiazepine, urine              Cutoff 200 ng/mL Methadone, urine                   Cutoff 300 ng/mL The urine drug screen provides only a preliminary, unconfirmed analytical test result and should not be used for non-medical purposes. Clinical consideration and professional judgment should be applied to any positive drug screen result due to possible interfering substances. A more specific alternate chemical method must be used in order to obtain a confirmed analytical result. Gas chromatography / mass spectrometry (GC/Lauren) is the preferred confirmat ory method. Performed at [facility], [address] [place], [place] [address]   Lipid panel     Status: Abnormal   Collection Time: 09/09/17  6:40 AM  Result Value Ref Range   Cholesterol 211 (H) 0 - 200 mg/dL   Triglycerides 102 <150 mg/dL   HDL 38 (L) >40 mg/dL   Total CHOL/HDL Ratio 5.6 RATIO   VLDL 20 0 - 40 mg/dL   LDL Cholesterol 153 (H) 0 - 99 mg/dL    Comment:        Total Cholesterol/HDL:CHD Risk Coronary Heart Disease Risk Table                     Men   Women  1/2 Average Risk   3.4   3.3  Average Risk       5.0   4.4  2 X Average Risk   9.6   7.1  3 X Average Risk  23.4   11.0        Use the calculated Patient Ratio above and the CHD Risk Table to determine the patient's CHD Risk.        ATP III CLASSIFICATION (LDL):  <100     mg/dL   Optimal  100-129  mg/dL   Near or Above                    Optimal  130-159  mg/dL   Borderline  160-189  mg/dL   High  >190     mg/dL   Very High Performed at [facility], [address] [place], [place] [address]   TSH     Status: None   Collection Time: 09/09/17  6:40 AM  Result Value Ref Range   TSH 1.426 0.350 - 4.500 uIU/mL    Comment: Performed by a 3rd Generation assay with a functional sensitivity of <=0.01 uIU/mL. Performed at [facility], [address] [place], [place] [address]      Blood Alcohol level:  Lab Results  Component Value Date   [facility] <10 09/06/2017   ETH <10 37/16/9678    Metabolic Disorder Labs:  Lab Results  Component Value Date   HGBA1C 6.5 03/25/2017   No results found for: PROLACTIN Lab Results  Component Value Date   CHOL 211 (H) 09/09/2017   TRIG 102 09/09/2017   HDL 38 (L) 09/09/2017   CHOLHDL 5.6 09/09/2017   VLDL 20 09/09/2017   LDLCALC 153 (H) 09/09/2017   LDLCALC 102 (H) 12/20/2016    Current Medications: Current Facility-Administered Medications  Medication Dose Route Frequency Provider Last Rate Last Dose  . acetaminophen (TYLENOL) tablet 650 mg  650 mg Oral Q6H PRN [name], [name], MD      . alum & mag hydroxide-simeth (MAALOX/MYLANTA) 200-200-20 MG/5ML suspension 30 mL  30 mL Oral Q4H PRN [name], [name], MD      . aspirin EC tablet 81 mg  81 mg Oral QAC breakfast [name], [name], MD   81 mg at 09/09/17 0910  . atenolol (TENORMIN) tablet 100 mg  100 mg Oral Daily [name], [name], MD   100 mg at 09/09/17 0911  . haloperidol (HALDOL) tablet 5 mg  5 mg Oral QHS [name], [name], MD   5 mg at 09/08/17 2216  . lisinopril (PRINIVIL,ZESTRIL) tablet 20 mg  20 mg Oral Daily [name], [name], MD   20 mg at 09/09/17 9381   And  . hydrochlorothiazide (HYDRODIURIL) tablet 25 mg  25 mg Oral Daily [name], [name], MD   25 mg at 09/09/17 0175  . hydrOXYzine (ATARAX/VISTARIL) tablet 25 mg  25 mg Oral TID PRN [name], [name], MD      . ibuprofen (ADVIL,MOTRIN) tablet 600 mg  600 mg Oral Q8H PRN [name], [name], MD      . LORazepam (ATIVAN) tablet 0.5 mg  0.5 mg Oral Q4H PRN [name], [name], MD      . magnesium hydroxide (MILK OF MAGNESIA) suspension 30 mL  30 mL Oral Daily PRN [name], [name], MD      . metFORMIN (GLUCOPHAGE) tablet 500 mg  500 mg Oral BID WC [name], [name], MD   500 mg at 09/09/17 0910  . rosuvastatin (CRESTOR) tablet 40 mg  40 mg Oral Daily [name], [name], MD   40 mg at 09/09/17 0910  . sertraline (ZOLOFT) tablet 50 mg   50 mg Oral Daily [name], [name], MD   50 mg at 09/09/17 0910  . traZODone (DESYREL) tablet 100 mg  100 mg Oral QHS PRN [name], [name], MD       PTA Medications: Medications Prior to Admission  Medication Sig Dispense Refill Last Dose  . aspirin EC 81 MG tablet Take 81 mg by mouth daily before breakfast.    unk at unk  . atenolol (TENORMIN) 100 MG tablet TAKE 1 TABLET BY MOUTH  DAILY 90 tablet 0   .  Bioflavonoid Products (VITAMIN C) CHEW Chew 1 tablet by mouth daily as needed (for immune system support).   prn  . Capsaicin (ZOSTRIX EX) Apply 1 application topically 4 (four) times daily as needed (for knee pain.).   prn  . haloperidol (HALDOL) 5 MG tablet Take 1 tablet (5 mg total) by mouth at bedtime. 30 tablet 0   . ibuprofen (ADVIL,MOTRIN) 200 MG tablet Take 600 mg by mouth every 8 (eight) hours as needed (for knee pain.).   prn  . lisinopril-hydrochlorothiazide (PRINZIDE,ZESTORETIC) 20-25 MG tablet TAKE 1 TABLET BY MOUTH  DAILY 90 tablet 0 unk at unk  . Lotion Base LOTN Apply 1 application topically 4 (four) times daily as needed (for knee pain.). [employer] Essential Lotion (Aloe Vera, Almond Oil, and 6 natural anti-inflammatory essential oils; [place], [place], [place], [place], [place] and [place])   prn  . metFORMIN (GLUCOPHAGE) 500 MG tablet TAKE 1 TABLET BY MOUTH TWICE DAILY WITH A MEAL 60 tablet 0 unk at unk  . omeprazole (PRILOSEC) 20 MG capsule TAKE 1 CAPSULE BY MOUTH  DAILY 90 capsule 0 unk at unk  . rosuvastatin (CRESTOR) 40 MG tablet TAKE 1 TABLET BY MOUTH  DAILY 90 tablet 0 unk at unk  . sertraline (ZOLOFT) 50 MG tablet TAKE 1 TABLET BY MOUTH  DAILY 90 tablet 0   . traZODone (DESYREL) 50 MG tablet Take 1 tablet (50 mg total) at bedtime as needed by mouth for sleep. 30 tablet 1 prn    Musculoskeletal: Strength & Muscle Tone: within normal limits Gait & Station: normal Patient leans: N/A  Psychiatric Specialty Exam: Physical Exam  Nursing note  and vitals reviewed. Constitutional: She is oriented to person, place, and time. She appears well-developed and well-nourished.  HENT:  Head: Normocephalic and atraumatic.  Eyes: Conjunctivae and EOM are normal. Pupils are equal, round, and reactive to light.  Neck: Normal range of motion. Neck supple.  Cardiovascular: Normal rate, regular rhythm and normal heart sounds.  Respiratory: Effort normal and breath sounds normal.  GI: Soft. Bowel sounds are normal.  Musculoskeletal: Normal range of motion.  Neurological: She is alert and oriented to person, place, and time.  Skin: Skin is warm and dry.  Psychiatric: Judgment normal. Her affect is blunt. Her speech is delayed. She is slowed and withdrawn. Thought content is paranoid. Cognition and memory are normal. She exhibits a depressed mood.    Review of Systems  Neurological: Negative.   Psychiatric/Behavioral: Positive for depression and hallucinations.  All other systems reviewed and are negative.   Blood pressure (!) 144/76, pulse 64, temperature 98.7 F (37.1 C), temperature source Oral, resp. rate 18, height 5\' 4"  (1.626 m), weight 108 kg (238 lb), SpO2 99 %.Body mass index is 40.85 kg/m.  See SRA                                                  Sleep:  Number of Hours: 7.15    Treatment Plan Summary: Daily contact with patient to assess and evaluate symptoms and progress in treatment and Medication management   Lauren. Bless is a 67 year old female with a history of schizophrenia admitted for paranoia in the context of medication changes and treatment noncompliance.  #Psychosis -restart Haldol 5 mg nightly -continue Zoloft 50 mg daily -consider Haldol decanoate injections  #Insomnia -continue  Trazodone 100 mg nightly  #Anxiety Vistaril is available  #HTN -continue Atenolol 100 mg, Lisinopril 20 mg and HCTZ 25 mg daily -continue ASA 81 mg daily  #DM -continue Metformin 500 mg BID -continue  Crestor 40 mg daily  #Metabolic syndrome monitoring -Lipid panel, TSH and HgbA1C are pending -EKG pending  #Disposition -discharge to home with her son -follow up with [facility]  Observation Level/Precautions:  15 minute checks  Laboratory:  CBC Chemistry Profile UDS UA  Psychotherapy:    Medications:    Consultations:    Discharge Concerns:    Estimated LOS:  Other:     Physician Treatment Plan for Primary Diagnosis: Schizophrenia, undifferentiated ([place]) Long Term Goal(s): Improvement in symptoms so as ready for discharge  Short Term Goals: Ability to identify changes in lifestyle to reduce recurrence of condition will improve, Ability to verbalize feelings will improve, Ability to disclose and discuss suicidal ideas, Ability to demonstrate self-control will improve, Ability to identify and develop effective coping behaviors will improve, Ability to maintain clinical measurements within normal limits will improve, Compliance with prescribed medications will improve and Ability to identify triggers associated with substance abuse/mental health issues will improve  Physician Treatment Plan for Secondary Diagnosis: Principal Problem:   Schizophrenia, undifferentiated ([place]) Active Problems:   Hypertension   HLD (hyperlipidemia)   Acid reflux   Diabetes mellitus type 2, controlled, without complications ([facility])   Apnea, sleep  Long Term Goal(s): NA  Short Term Goals: NA  I certify that inpatient services furnished can reasonably be expected to improve the patient's condition.    Orson Slick, MD 1/1/201910:15 AM

## 2017-09-09 NOTE — BHH Group Notes (Signed)
LCSW Group Therapy Note 09/09/2017 9:00am  Type of Therapy and Topic:  Group Therapy:  Setting Goals  Participation Level:  Did Not Attend  Description of Group: In this process group, patients discussed using strengths to work toward goals and address challenges.  Patients identified two positive things about themselves and one goal they were working on.  Patients were given the opportunity to share openly and support each other's plan for self-empowerment.  The group discussed the value of gratitude and were encouraged to have a daily reflection of positive characteristics or circumstances.  Patients were encouraged to identify a plan to utilize their strengths to work on current challenges and goals.  Therapeutic Goals 1. Patient will verbalize personal strengths/positive qualities and relate how these can assist with achieving desired personal goals 2. Patients will verbalize affirmation of peers plans for personal change and goal setting 3. Patients will explore the value of gratitude and positive focus as related to successful achievement of goals 4. Patients will verbalize a plan for regular reinforcement of personal positive qualities and circumstances.  Summary of Patient Progress:       Therapeutic Modalities Cognitive Behavioral Therapy Motivational Interviewing    [name], LCSW 09/09/2017 10:15 AM

## 2017-09-09 NOTE — Plan of Care (Signed)
Patient isolated in room and did not attend any group activities.

## 2017-09-09 NOTE — BHH Suicide Risk Assessment (Signed)
[facility] Admission Suicide Risk Assessment   Nursing information obtained from:    Demographic factors:    Current Mental Status:    Loss Factors:    Historical Factors:    Risk Reduction Factors:     Total Time spent with patient: 1 hour Principal Problem: Schizophrenia, undifferentiated ([place]) Diagnosis:   Patient Active Problem List   Diagnosis Date Noted  . Schizophrenia, undifferentiated ([place]) [F20.3] 09/08/2017    Priority: High  . Screening for cervical cancer [Z12.4] 07/29/2017  . 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 Gravida 1 [IMO0002] 10/26/2015  . Dentagra [K08.89] 10/26/2015  . Itch of skin [L29.9] 10/26/2015  . Sex counseling [Z70.9] 10/26/2015  . Excessive sweating [R61] 07/05/2015  . Acid reflux [K21.9] 06/29/2015  . Encounter for pre-employment examination [Z02.1] 06/29/2015  . Diabetes mellitus type 2, controlled, without complications ([place]) [E31.5] 06/29/2015  . Cardiac murmur [R01.1] 06/29/2015  . Arthritis of knee, degenerative [M17.10] 06/28/2015  . Stiffness of both knees [M25.661, M25.662] 06/22/2015  . Insomnia w/ sleep apnea [G47.00, G47.30] 03/21/2015  . Anxiety disorder [F41.9] 03/21/2015  . Hypertension [I10] 03/21/2015  . HLD (hyperlipidemia) [E78.5] 03/21/2015  . Paranoid schizophrenia ([place]) [F20.0] 03/21/2015  . Urge incontinence [N39.41] 03/21/2015  . Breathlessness on exertion [R06.81] 02/02/2014  . Awareness of heartbeats [R00.2] 02/02/2014  . Apnea, sleep [G47.30] 02/02/2014   Subjective Data: psychotic break  Continued Clinical Symptoms:  Alcohol Use Disorder Identification Test Final Score (AUDIT): 0 The "Alcohol Use Disorders Identification Test", Guidelines for Use in Primary Care, Second Edition.  World Pharmacologist [facility]). Score between 0-7:  no or low risk or alcohol related problems. Score between 8-15:  moderate risk of alcohol related problems. Score between 16-19:  high risk of  alcohol related problems. Score 20 or above:  warrants further diagnostic evaluation for alcohol dependence and treatment.   CLINICAL FACTORS:   Schizophrenia:   Depressive state Paranoid or undifferentiated type   Musculoskeletal: Strength & Muscle Tone: within normal limits Gait & Station: normal Patient leans: N/A  Psychiatric Specialty Exam: Physical Exam  Nursing note and vitals reviewed. Psychiatric: Her affect is blunt. Her speech is delayed. She is slowed and withdrawn. Thought content is paranoid. Cognition and memory are normal. She expresses inappropriate judgment. She exhibits a depressed mood.    Review of Systems  Neurological: Negative.   Psychiatric/Behavioral: Positive for depression and hallucinations.  All other systems reviewed and are negative.   Blood pressure (!) 144/76, pulse 64, temperature 98.7 F (37.1 C), temperature source Oral, resp. rate 18, height 5\' 4"  (1.626 m), weight 108 kg (238 lb), SpO2 99 %.Body mass index is 40.85 kg/m.  General Appearance: Disheveled  Eye Contact:  Minimal  Speech:  Blocked  Volume:  Decreased  Mood:  Depressed  Affect:  Blunt  Thought Process:  Goal Directed and Descriptions of Associations: Intact  Orientation:  Full (Time, Place, and Person)  Thought Content:  Paranoid Ideation  Suicidal Thoughts:  No  Homicidal Thoughts:  No  Memory:  Immediate;   Fair Recent;   Fair Remote;   Fair  Judgement:  Poor  Insight:  Lacking  Psychomotor Activity:  Psychomotor Retardation  Concentration:  Concentration: Fair and Attention Span: Fair  Recall:  [employer] of Knowledge:  Fair  Language:  Fair  Akathisia:  No  Handed:  Right  AIMS (if indicated):     Assets:  Communication Skills Desire for Improvement Financial Resources/Insurance [place]  Resilience Social Support  ADL's:  Intact  Cognition:  WNL  Sleep:  Number of Hours: 7.15      COGNITIVE FEATURES THAT CONTRIBUTE TO RISK:  None     SUICIDE RISK:   Mild:  Suicidal ideation of limited frequency, intensity, duration, and specificity.  There are no identifiable plans, no associated intent, mild dysphoria and related symptoms, good self-control (both objective and subjective assessment), few other risk factors, and identifiable protective factors, including available and accessible social support.  PLAN OF CARE: hospital admission, medication mamagement, discharge planning.  Ms. Duskin is a 67 year old female with a history of schizophrenia admitted for paranoia in the context of medication changes and treatment noncompliance.  #Psychosis -restart Haldol 5 mg nightly -continue Zoloft 50 mg daily -consider Haldol decanoate injections  #Insomnia -continue Trazodone 100 mg nightly  #Anxiety Vistaril is available  #HTN -continue Atenolol 100 mg, Lisinopril 20 mg and HCTZ 25 mg daily -continue ASA 81 mg daily  #DM -continue Metformin 500 mg BID -continue Crestor 40 mg daily  #Metabolic syndrome monitoring -Lipid panel, TSH and HgbA1C are pending -EKG pending  #Disposition -discharge to home with her son -follow up with [facility]  I certify that inpatient services furnished can reasonably be expected to improve the patient's condition.   Orson Slick, MD 09/09/2017, 10:03 AM

## 2017-09-09 NOTE — Progress Notes (Signed)
Recreation Therapy Notes  INPATIENT RECREATION THERAPY ASSESSMENT  Patient stated she was to tired to complete assessment.      Patient Details Name: MAYLI COVINGTON MRN: [id number] DOB: 11-Nov-1950 Today's Date: 09/09/2017  Patient Stressors:    Coping Skills:      Personal Challenges:    Leisure Interests (2+):     Awareness of Community Resources:     [employer]:     Current Use:    If no, Barriers?:    Patient Strengths:     Patient Identified Areas of Improvement:     Current Recreation Participation:     Patient Goal for Hospitalization:     [place] of Residence:     [place] of Residence:      Current SI (including self-harm):     Current HI:     Consent to Intern Participation:     Gery Sabedra 09/09/2017, 2:18 PM

## 2017-09-10 MED ORDER — AMANTADINE HCL 100 MG PO CAPS
100.0000 mg | ORAL_CAPSULE | Freq: Two times a day (BID) | ORAL | Status: DC
  Administered 2017-09-10 – 2017-09-11 (×3): 100 mg via ORAL
  Filled 2017-09-10 (×9): qty 1

## 2017-09-10 NOTE — Progress Notes (Addendum)
Patient found sleeping in bed upon my arrival. Patient is minimally verbal throughout assessment, no eye contact. Affect is flat. Patient denies all complaints including anxiety, depression, SI, HI, and AVH. Denies pain. Reports appetite is adequate but refused snack. Patient is minimally cooperative and must be medicated in her bed. Reports BM today. Refused to provide urine sample at this time. Q 15 minute checks maintained. Will monitor throughout the shift.   Patient slept through the night and continues to deny all complaints. Will endorse care to oncoming shift.

## 2017-09-10 NOTE — BHH Counselor (Signed)
Adult Comprehensive Assessment  Patient ID: Lauren [name], female   DOB: 30-Sep-1950, 67 y.o.   MRN: [id number]  Information Source: Information source: Patient  Current Stressors:  Family Relationships: became paranoid  Living/Environment/Situation:  Living Arrangements: Children What is atmosphere in current home: Comfortable, Quarry manager, Supportive  Family History:  Marital status: Single Are you sexually active?: No Does patient have children?: Yes How many children?: 1 How is patient's relationship with their children?: son  Childhood History:  Did patient suffer any verbal/emotional/physical/sexual abuse as a child?: No Did patient suffer from severe childhood neglect?: No Has patient ever been sexually abused/assaulted/raped as an adolescent or adult?: No Was the patient ever a victim of a crime or a disaster?: No Witnessed domestic violence?: No Has patient been effected by domestic violence as an adult?: No  Education:  Highest grade of school patient has completed: None reported  Currently a student?: No Name of school: N/A Learning disability?: No  Employment/Work Situation:   Employment situation: On disability Why is patient on disability: Currently receiving disability for her nerves and being paranoid Schizophrenic, 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 [employer] How long has patient been on disability: over 5 years Patient's job has been impacted by current illness: Yes Describe how patient's job has been impacted: uncontrollable symptoms What is the longest time patient has a held a job?: unknown 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 Has patient ever been in the [employer]?: No Has patient ever served in combat?: No Did You Receive Any Psychiatric Treatment/Services While in Passenger transport manager?: No Are There Guns or Other Weapons in [place]?: No Are These Weapons Safely Secured?: No  Financial Resources:   Financial resources: Civil engineer, contracting Does patient have a Programmer, applications or guardian?: No  Alcohol/Substance Abuse:   If attempted suicide, did drugs/alcohol play a role in this?: No Alcohol/Substance Abuse Treatment Hx: Denies past history Has alcohol/substance abuse ever caused legal problems?: No  Social Support System:   Patient's Community Support System: Fair Astronomer System: friends and family Type of faith/religion: christian How does patient's faith help to cope with current illness?: encourages me, describes she sometimes feels guilty, like she must apologizes for something she has done because she feels people must be after her for a reason.  Leisure/Recreation:   Leisure and Hobbies: Reading, watching tv [name], [name], [name] singing)  Strengths/Needs:      Discharge Plan:   Does patient have access to transportation?: Yes Will patient be returning to same living situation after discharge?: Yes Currently receiving community mental health services: Yes (From Whom)([facility]-Dr. Einar Grad) If no, would patient like referral for services when discharged?: No Does patient have financial barriers related to discharge medications?: Yes Patient description of barriers related to discharge medications: .  Summary/Recommendations:   Summary and Recommendations (to be completed by the evaluator): Pt is 67yo female who was brought to the ER by her son after worsening paranoia.  She verbalizes that she noticed feeling different a few days before Thanksgiving and this could be contributed to a medication change made around the same time.  While on the unit she will have access to groups and therapeutic milieu.  She will have medications managed and assistance with appropriate discharge planning.  Reccomendations include crisis stabilization and follow up with chosen outpatient medication management providers  [name].09/10/2017

## 2017-09-10 NOTE — Progress Notes (Signed)
[facility] MD Progress Note  09/10/2017 11:04 AM MONEE DEMBECK  MRN:  [id number]  Subjective:   Ms. Smelcer met with treatment team today. She is still paranoid and refuses to sign paper at the end of the meeting. She insists that she is here voluntarily and believes that she shouls be discharged now as 72 hours have already passed. She does take haldol and, so far has no tremors. This was the reason to switch to Seroquel that did not control her paranoia. Sleep is interrupted. She woke up early again.  Treatment plan. She was started on Haldol 5 mg in the ER and has been making progress. We will add Amantadine for EPS.   Social/disposition. She will be discharged to home with her son. We need to get in touch with him even without her permission "I don't sign anything". Follow up with Dr. Einar [facility] at [facility].  Principal Problem: Schizophrenia, undifferentiated ([place]) Diagnosis:   Patient Active Problem List   Diagnosis Date Noted  . Schizophrenia, undifferentiated ([place]) [F20.3] 09/08/2017    Priority: High  . Screening for cervical cancer [Z12.4] 07/29/2017  . 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 Gravida 1 [IMO0002] 10/26/2015  . Dentagra [K08.89] 10/26/2015  . Itch of skin [L29.9] 10/26/2015  . Sex counseling [Z70.9] 10/26/2015  . Excessive sweating [R61] 07/05/2015  . Acid reflux [K21.9] 06/29/2015  . Encounter for pre-employment examination [Z02.1] 06/29/2015  . Diabetes mellitus type 2, controlled, without complications ([place]) [O29.4] 06/29/2015  . Cardiac murmur [R01.1] 06/29/2015  . Arthritis of knee, degenerative [M17.10] 06/28/2015  . Stiffness of both knees [M25.661, M25.662] 06/22/2015  . Insomnia w/ sleep apnea [G47.00, G47.30] 03/21/2015  . Anxiety disorder [F41.9] 03/21/2015  . Hypertension [I10] 03/21/2015  . HLD (hyperlipidemia) [E78.5] 03/21/2015  . Paranoid schizophrenia ([place]) [F20.0] 03/21/2015  . Urge incontinence [N39.41]  03/21/2015  . Breathlessness on exertion [R06.81] 02/02/2014  . Awareness of heartbeats [R00.2] 02/02/2014  . Apnea, sleep [G47.30] 02/02/2014   Total Time spent with patient: 20 minutes  Past Psychiatric History: schizophrenia  Past Medical History:  Past Medical History:  Diagnosis Date  . Anxiety   . Diabetes mellitus   . Fibromyalgia   . GERD (gastroesophageal reflux disease)   . Heart murmur   . Herniated disc   . Hyperlipidemia   . Hypertension   . Lack of bladder control   . Lung tumor   . Rheumatoid arthritis ([place])   . Schizoaffective disorder ([place])   . Seizure [facility])    after brain surgery 2012. last seizure 2013!  [name] Sleep apnea     Past Surgical History:  Procedure Laterality Date  . BRAIN TUMOR EXCISION  2012   benign  . CARDIAC CATHETERIZATION  2008  . CESAREAN SECTION    . CYST REMOVAL HAND    . LUNG LOBECTOMY  1977   benign tumor  . 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 Heart disease Brother   . Depression Mother   . Heart attack Mother   . Stroke Mother   . Alcohol abuse Father   . Stroke Father   . Diabetes Sister   . Diabetes Sister   . Stomach cancer Sister   . Kidney disease Sister   . COPD Brother   .  Lung cancer Brother   . Diabetes Brother    Family Psychiatric  History: none reported Social History:  Social History   Substance and Sexual Activity  Alcohol Use No  . Alcohol/week: 0.0 oz     Social History   Substance and Sexual Activity  Drug Use No    Social History   Socioeconomic History  . Marital status: Single    Spouse name: None  . Number of children: None  . Years of education: None  . Highest education level: None  Social Needs  . Financial resource strain: Not hard at all  . Food insecurity - worry: Never true   . Food insecurity - inability: Never true  . Transportation needs - medical: No  . Transportation needs - non-medical: No  Occupational History  . None  Tobacco Use  . Smoking status: Never Smoker  . Smokeless tobacco: Never Used  Substance and Sexual Activity  . Alcohol use: No    Alcohol/week: 0.0 oz  . Drug use: No  . Sexual activity: No  Other Topics Concern  . None  Social History Narrative  . None   Additional Social History:                         Sleep: Poor  Appetite:  Fair  Current Medications: Current Facility-Administered Medications  Medication Dose Route Frequency Provider Last Rate Last Dose  . acetaminophen (TYLENOL) tablet 650 mg  650 mg Oral Q6H PRN [name], [name], MD      . alum & mag hydroxide-simeth (MAALOX/MYLANTA) 200-200-20 MG/5ML suspension 30 mL  30 mL Oral Q4H PRN [name], [name], MD      . amantadine (SYMMETREL) capsule 100 mg  100 mg Oral BID Akemi Overholser B, MD      . aspirin EC tablet 81 mg  81 mg Oral QAC breakfast [name], [name], MD   81 mg at 09/10/17 0844  . atenolol (TENORMIN) tablet 100 mg  100 mg Oral Daily [name], [name], MD   100 mg at 09/10/17 0844  . haloperidol (HALDOL) tablet 5 mg  5 mg Oral QHS [name], [name], MD   5 mg at 09/09/17 2207  . lisinopril (PRINIVIL,ZESTRIL) tablet 20 mg  20 mg Oral Daily [name], [name], MD   20 mg at 09/10/17 0844   And  . hydrochlorothiazide (HYDRODIURIL) tablet 25 mg  25 mg Oral Daily [name], [name], MD   25 mg at 09/10/17 0844  . hydrOXYzine (ATARAX/VISTARIL) tablet 25 mg  25 mg Oral TID PRN [name], [name], MD      . ibuprofen (ADVIL,MOTRIN) tablet 600 mg  600 mg Oral Q8H PRN [name], [name], MD      . LORazepam (ATIVAN) tablet 0.5 mg  0.5 mg Oral Q4H PRN [name], [name], MD      . magnesium hydroxide (MILK OF MAGNESIA) suspension 30 mL  30 mL Oral Daily PRN [name], [name], MD      . metFORMIN (GLUCOPHAGE) tablet 500 mg  500 mg Oral BID WC [name], [name], MD   500 mg at  09/10/17 0844  . rosuvastatin (CRESTOR) tablet 40 mg  40 mg Oral Daily [name], [name], MD   40 mg at 09/10/17 0843  . sertraline (ZOLOFT) tablet 50 mg  50 mg Oral Daily [name], [name], MD   50 mg at 09/10/17 0844  . traZODone (DESYREL) tablet 100 mg  100 mg  Oral QHS PRN [name], [name], MD        Lab Results:  Results for orders placed or performed during the hospital encounter of 09/08/17 (from the past 48 hour(s))  Urine Drug Screen, Qualitative     Status: Abnormal   Collection Time: 09/08/17  5:33 PM  Result Value Ref Range   Tricyclic, Ur Screen NONE DETECTED NONE DETECTED   Amphetamines, Ur Screen NONE DETECTED NONE DETECTED   MDMA (Ecstasy)Ur Screen NONE DETECTED NONE DETECTED   Cocaine Metabolite,Ur [place] NONE DETECTED NONE DETECTED   Opiate, Ur Screen NONE DETECTED NONE DETECTED   Phencyclidine (PCP) Ur S NONE DETECTED NONE DETECTED   Cannabinoid 50 Ng, Ur [place] NONE DETECTED NONE DETECTED   Barbiturates, Ur Screen NONE DETECTED NONE DETECTED   Benzodiazepine, Ur Scrn POSITIVE (A) NONE DETECTED   Methadone Scn, Ur NONE DETECTED NONE DETECTED    Comment: (NOTE) Tricyclics + metabolites, urine    Cutoff 1000 ng/mL Amphetamines + metabolites, urine  Cutoff 1000 ng/mL MDMA (Ecstasy), urine              Cutoff 500 ng/mL Cocaine Metabolite, urine          Cutoff 300 ng/mL Opiate + metabolites, urine        Cutoff 300 ng/mL Phencyclidine (PCP), urine         Cutoff 25 ng/mL Cannabinoid, urine                 Cutoff 50 ng/mL Barbiturates + metabolites, urine  Cutoff 200 ng/mL Benzodiazepine, urine              Cutoff 200 ng/mL Methadone, urine                   Cutoff 300 ng/mL The urine drug screen provides only a preliminary, unconfirmed analytical test result and should not be used for non-medical purposes. Clinical consideration and professional judgment should be applied to any positive drug screen result due to possible interfering substances. A more specific alternate chemical  method must be used in order to obtain a confirmed analytical result. Gas chromatography / mass spectrometry (GC/MS) is the preferred confirmat ory method. Performed at [facility], [address] [place], [place] [address]   Hemoglobin A1c     Status: Abnormal   Collection Time: 09/09/17  6:40 AM  Result Value Ref Range   Hgb A1c MFr Bld 6.2 (H) 4.8 - 5.6 %    Comment: (NOTE) Pre diabetes:          5.7%-6.4% Diabetes:              >6.4% Glycemic control for   <7.0% adults with diabetes    Mean Plasma Glucose 131.24 mg/dL    Comment: Performed at [facility] [address], [place], [place] [address]  Lipid panel     Status: Abnormal   Collection Time: 09/09/17  6:40 AM  Result Value Ref Range   Cholesterol 211 (H) 0 - 200 mg/dL   Triglycerides 102 <150 mg/dL   HDL 38 (L) >40 mg/dL   Total CHOL/HDL Ratio 5.6 RATIO   VLDL 20 0 - 40 mg/dL   LDL Cholesterol 153 (H) 0 - 99 mg/dL    Comment:        Total Cholesterol/HDL:CHD Risk Coronary Heart Disease Risk Table                     Men   Women  1/2 Average Risk   3.4   3.3  Average Risk       5.0   4.4  2 X Average Risk   9.6   7.1  3 X Average Risk  23.4   11.0        Use the calculated Patient Ratio above and the CHD Risk Table to determine the patient's CHD Risk.        ATP III CLASSIFICATION (LDL):  <100     mg/dL   Optimal  100-129  mg/dL   Near or Above                    Optimal  130-159  mg/dL   Borderline  160-189  mg/dL   High  >190     mg/dL   Very High Performed at [facility], [address] [place], [place] [address]   TSH     Status: None   Collection Time: 09/09/17  6:40 AM  Result Value Ref Range   TSH 1.426 0.350 - 4.500 uIU/mL    Comment: Performed by a 3rd Generation assay with a functional sensitivity of <=0.01 uIU/mL. Performed at [facility], [address] [place], [place] [address]     Blood Alcohol level:  Lab Results  Component Value Date    [facility] <10 09/06/2017   ETH <10 34/28/7681    Metabolic Disorder Labs: Lab Results  Component Value Date   HGBA1C 6.2 (H) 09/09/2017   MPG 131.24 09/09/2017   No results found for: PROLACTIN Lab Results  Component Value Date   CHOL 211 (H) 09/09/2017   TRIG 102 09/09/2017   HDL 38 (L) 09/09/2017   CHOLHDL 5.6 09/09/2017   VLDL 20 09/09/2017   LDLCALC 153 (H) 09/09/2017   LDLCALC 102 (H) 12/20/2016    Physical Findings: AIMS:  , ,  ,  ,    CIWA:    COWS:     Musculoskeletal: Strength & Muscle Tone: within normal limits Gait & Station: normal Patient leans: N/A  Psychiatric Specialty Exam: Physical Exam  Nursing note and vitals reviewed. Psychiatric: Judgment normal. Her affect is blunt. Her speech is delayed. She is slowed. Thought content is paranoid and delusional. Cognition and memory are normal.    Review of Systems  Neurological: Negative.   Psychiatric/Behavioral: Positive for hallucinations. The patient has insomnia.   All other systems reviewed and are negative.   Blood pressure 134/77, pulse 79, temperature 98.5 F (36.9 C), temperature source Oral, resp. rate 18, height _0  (1.626 m), weight 108 kg (238 lb), SpO2 99 %.Body mass index is 40.85 kg/m.  General Appearance: Disheveled  Eye Contact:  Fair  Speech:  Blocked and Slurred  Volume:  Normal  Mood:  Irritable  Affect:  Blunt  Thought Process:  Goal Directed and Descriptions of Associations: Intact  Orientation:  Full (Time, Place, and Person)  Thought Content:  Delusions and Paranoid Ideation  Suicidal Thoughts:  No  Homicidal Thoughts:  No  Memory:  Immediate;   Fair Recent;   Fair Remote;   Fair  Judgement:  Poor  Insight:  Lacking  Psychomotor Activity:  Decreased  Concentration:  Concentration: Fair and Attention Span: Fair  Recall:  [employer] of Knowledge:  Fair  Language:  Fair  Akathisia:  No  Handed:  Right  AIMS (if indicated):     Assets:  Communication Skills Desire for  Improvement Financial Resources/Insurance [place]  Support  ADL's:  Intact  Cognition:  WNL  Sleep:  Number of Hours: 6     Treatment Plan Summary: Daily contact with patient to assess and evaluate symptoms and progress in treatment and Medication management   Ms. Gonsoulin is a 67 year old female with a history of schizophrenia admitted for paranoia in the context of medication changes and treatment noncompliance.  #Psychosis -restart Haldol 5 mg nightly -continue Zoloft 50 mg daily -start Amantadine 100 mg BID  #Insomnia -increase Trazodone to 150 mg nightly  #Anxiety Vistaril is available  #HTN -continue Atenolol 100 mg, Lisinopril 20 mg and HCTZ 25 mg daily -continue ASA 81 mg daily  #DM -continue Metformin 500 mg BID -continue Crestor 40 mg daily  #Metabolic syndrome monitoring -Lipid panel, TSH and HgbA1C are pending -EKG pending  #Disposition -discharge to home with her son -follow up with [facility]    Orson Slick, MD 09/10/2017, 11:04 AM

## 2017-09-10 NOTE — Progress Notes (Signed)
Recreation Therapy Notes  Date: 01.02.2019   Time: 9:30 am   Location: Craft Room   Behavioral response: N/A   Intervention Topic: Anger   Discussion/Intervention: Patient did not attend group. Clinical Observations/Feedback:  Patient did not attend group.  Amarri Satterly LRT/CTRS             Niamh Rada 09/10/2017 12:28 PM

## 2017-09-10 NOTE — Plan of Care (Signed)
Patient slept for Estimated Hours of 6.45; Precautionary checks every 15 minutes for safety maintained, room free of safety hazards, patient sustains no injury or falls during this shift.

## 2017-09-10 NOTE — Plan of Care (Signed)
  Progressing Activity: Sleeping patterns will improve 09/10/2017 2133 - Progressing by Derek Mound, RN

## 2017-09-10 NOTE — BHH Group Notes (Signed)
  09/10/2017  Time: 1PM  Type of Therapy/Topic:  Group Therapy:  Emotion Regulation  Participation Level:  Did Not Attend   Description of Group:    The purpose of this group is to assist patients in learning to regulate negative emotions and experience positive emotions. Patients will be guided to discuss ways in which they have been vulnerable to their negative emotions. These vulnerabilities will be juxtaposed with experiences of positive emotions or situations, and patients will be challenged to use positive emotions to combat negative ones. Special emphasis will be placed on coping with negative emotions in conflict situations, and patients will process healthy conflict resolution skills.  Therapeutic Goals: 1. Patient will identify two positive emotions or experiences to reflect on in order to balance out negative emotions 2. Patient will label two or more emotions that they find the most difficult to experience 3. Patient will demonstrate positive conflict resolution skills through discussion and/or role plays  Summary of Patient Progress: Pt was invited to attend group but chose not to attend. CSW will continue to encourage pt to attend group throughout their admission.    Therapeutic Modalities:   Cognitive Behavioral Therapy Feelings Identification Dialectical Behavioral Therapy  Alden Hipp, MSW, LCSW 09/10/2017 1:16 PM

## 2017-09-10 NOTE — Progress Notes (Signed)
Received [place] this am after her breakfast,she was compliant with her medications. She was evasive with this Probation officer and denied all of the psychiatric symptoms.  No change in her status this PM.

## 2017-09-10 NOTE — Progress Notes (Signed)
Recreation Therapy Notes  Date: 01.02.2018  Time: 3:00pm  Location: Craft room  Behavioral response: N/A  Group Type: Craft  Participation level: N/A  Communication: Patient did not attend group.  Comments: N/A  Jasan Doughtie LRT/CTRS         Katyra Tomassetti 09/10/2017 4:08 PM

## 2017-09-10 NOTE — Tx Team (Signed)
Interdisciplinary Treatment and Diagnostic Plan Update  09/10/2017 Time of Session: 10:30am [name] MRN: [id number]  Principal Diagnosis: Schizophrenia, undifferentiated ([place])  Secondary Diagnoses: Principal Problem:   Schizophrenia, undifferentiated ([place]) Active Problems:   Hypertension   HLD (hyperlipidemia)   Acid reflux   Diabetes mellitus type 2, controlled, without complications (HCC)   Apnea, sleep   Current Medications:  Current Facility-Administered Medications  Medication Dose Route Frequency Provider Last Rate Last Dose  . acetaminophen (TYLENOL) tablet 650 mg  650 mg Oral Q6H PRN [name], [name], MD      . alum & mag hydroxide-simeth (MAALOX/MYLANTA) 200-200-20 MG/5ML suspension 30 mL  30 mL Oral Q4H PRN [name], [name], MD      . amantadine (SYMMETREL) capsule 100 mg  100 mg Oral BID [name], [name], MD      . aspirin EC tablet 81 mg  81 mg Oral QAC breakfast [name], Lauren Reno, MD   81 mg at 09/10/17 0844  . atenolol (TENORMIN) tablet 100 mg  100 mg Oral Daily [name], [name], MD   100 mg at 09/10/17 0844  . haloperidol (HALDOL) tablet 5 mg  5 mg Oral QHS [name], Lauren Reno, MD   5 mg at 09/09/17 2207  . lisinopril (PRINIVIL,ZESTRIL) tablet 20 mg  20 mg Oral Daily [name], [name], MD   20 mg at 09/10/17 0844   And  . hydrochlorothiazide (HYDRODIURIL) tablet 25 mg  25 mg Oral Daily [name], Lauren Reno, MD   25 mg at 09/10/17 0844  . hydrOXYzine (ATARAX/VISTARIL) tablet 25 mg  25 mg Oral TID PRN [name], Lauren Reno, MD      . ibuprofen (ADVIL,MOTRIN) tablet 600 mg  600 mg Oral Q8H PRN [name], [name], MD      . LORazepam (ATIVAN) tablet 0.5 mg  0.5 mg Oral Q4H PRN [name], [name], MD      . magnesium hydroxide (MILK OF MAGNESIA) suspension 30 mL  30 mL Oral Daily PRN [name], [name], MD      . metFORMIN (GLUCOPHAGE) tablet 500 mg  500 mg Oral BID WC [name], Lauren Reno, MD   500 mg at 09/10/17 0844  . rosuvastatin (CRESTOR) tablet 40 mg  40 mg Oral Daily [name], Lauren Reno,  MD   40 mg at 09/10/17 0843  . sertraline (ZOLOFT) tablet 50 mg  50 mg Oral Daily [name], Lauren Reno, MD   50 mg at 09/10/17 0844  . traZODone (DESYREL) tablet 100 mg  100 mg Oral QHS PRN [name], Lauren Reno, MD       PTA Medications: Medications Prior to Admission  Medication Sig Dispense Refill Last Dose  . aspirin EC 81 MG tablet Take 81 mg by mouth daily before breakfast.    unk at unk  . atenolol (TENORMIN) 100 MG tablet TAKE 1 TABLET BY MOUTH  DAILY 90 tablet 0   . Bioflavonoid Products (VITAMIN C) CHEW Chew 1 tablet by mouth daily as needed (for immune system support).   prn  . Capsaicin (ZOSTRIX EX) Apply 1 application topically 4 (four) times daily as needed (for knee pain.).   prn  . haloperidol (HALDOL) 5 MG tablet Take 1 tablet (5 mg total) by mouth at bedtime. 30 tablet 0   . ibuprofen (ADVIL,MOTRIN) 200 MG tablet Take 600 mg by mouth every 8 (eight) hours as needed (for knee pain.).   prn  . lisinopril-hydrochlorothiazide (PRINZIDE,ZESTORETIC) 20-25 MG tablet TAKE 1 TABLET BY MOUTH  DAILY 90  tablet 0 unk at unk  . Lotion Base LOTN Apply 1 application topically 4 (four) times daily as needed (for knee pain.). [employer] Essential Lotion (Aloe Vera, Almond Oil, and 6 natural anti-inflammatory essential oils; [place], [place], [place], [place], Peppermint and [place])   prn  . metFORMIN (GLUCOPHAGE) 500 MG tablet TAKE 1 TABLET BY MOUTH TWICE DAILY WITH A MEAL 60 tablet 0 unk at unk  . omeprazole (PRILOSEC) 20 MG capsule TAKE 1 CAPSULE BY MOUTH  DAILY 90 capsule 0 unk at unk  . rosuvastatin (CRESTOR) 40 MG tablet TAKE 1 TABLET BY MOUTH  DAILY 90 tablet 0 unk at unk  . sertraline (ZOLOFT) 50 MG tablet TAKE 1 TABLET BY MOUTH  DAILY 90 tablet 0   . traZODone (DESYREL) 50 MG tablet Take 1 tablet (50 mg total) at bedtime as needed by mouth for sleep. 30 tablet 1 prn    Patient Stressors: Health problems Medication change or noncompliance  Patient Strengths: Average  or above average intelligence Communication skills Motivation for treatment/growth  Treatment Modalities: Medication Management, Group therapy, Case management,  1 to 1 session with clinician, Psychoeducation, Recreational therapy.   Physician Treatment Plan for Primary Diagnosis: Schizophrenia, undifferentiated (Lauren [name]) Long Term Goal(s): Improvement in symptoms so as ready for discharge NA   Short Term Goals: Ability to identify changes in lifestyle to reduce recurrence of condition will improve Ability to verbalize feelings will improve Ability to disclose and discuss suicidal ideas Ability to demonstrate self-control will improve Ability to identify and develop effective coping behaviors will improve Ability to maintain clinical measurements within normal limits will improve Compliance with prescribed medications will improve Ability to identify triggers associated with substance abuse/mental health issues will improve NA  Medication Management: Evaluate patient's response, side effects, and tolerance of medication regimen.  Therapeutic Interventions: 1 to 1 sessions, Unit Group sessions and Medication administration.  Evaluation of Outcomes: Progressing  Physician Treatment Plan for Secondary Diagnosis: Principal Problem:   Schizophrenia, undifferentiated ([place]) Active Problems:   Hypertension   HLD (hyperlipidemia)   Acid reflux   Diabetes mellitus type 2, controlled, without complications (HCC)   Apnea, sleep  Long Term Goal(s): Improvement in symptoms so as ready for discharge NA   Short Term Goals: Ability to identify changes in lifestyle to reduce recurrence of condition will improve Ability to verbalize feelings will improve Ability to disclose and discuss suicidal ideas Ability to demonstrate self-control will improve Ability to identify and develop effective coping behaviors will improve Ability to maintain clinical measurements within normal limits will  improve Compliance with prescribed medications will improve Ability to identify triggers associated with substance abuse/mental health issues will improve NA     Medication Management: Evaluate patient's response, side effects, and tolerance of medication regimen.  Therapeutic Interventions: 1 to 1 sessions, Unit Group sessions and Medication administration.  Evaluation of Outcomes: Progressing   RN Treatment Plan for Primary Diagnosis: Schizophrenia, undifferentiated ([place]) Long Term Goal(s): Knowledge of disease and therapeutic regimen to maintain health will improve  Short Term Goals: Ability to identify and develop effective coping behaviors will improve and Compliance with prescribed medications will improve  Medication Management: RN will administer medications as ordered by provider, will assess and evaluate patient's response and provide education to patient for prescribed medication. RN will report any adverse and/or side effects to prescribing provider.  Therapeutic Interventions: 1 on 1 counseling sessions, Psychoeducation, Medication administration, Evaluate responses to treatment, Monitor vital signs and CBGs as ordered,  Perform/monitor CIWA, COWS, AIMS and Fall Risk screenings as ordered, Perform wound care treatments as ordered.  Evaluation of Outcomes: Progressing   LCSW Treatment Plan for Primary Diagnosis: Schizophrenia, undifferentiated ([place]) Long Term Goal(s): Safe transition to appropriate next level of care at discharge, Engage patient in therapeutic group addressing interpersonal concerns.  Short Term Goals: Engage patient in aftercare planning with referrals and resources, Identify triggers associated with mental health/substance abuse issues and Increase skills for wellness and recovery  Therapeutic Interventions: Assess for all discharge needs, 1 to 1 time with Social worker, Explore available resources and support systems, Assess for adequacy in community  support network, Educate family and significant other(s) on suicide prevention, Complete Psychosocial Assessment, Interpersonal group therapy.  Evaluation of Outcomes: Progressing   Progress in Treatment: Attending groups: No Participating in groups: No. Taking medication as prescribed: Yes. Toleration medication: Yes. Family/Significant other contact made: No, will contact:  when given permission Patient understands diagnosis: Yes. Discussing patient identified problems/goals with staff: Yes. Medical problems stabilized or resolved: Yes. Denies suicidal/homicidal ideation: Yes. Issues/concerns per patient self-inventory: No. Other:    New problem(s) identified: No, Describe:     New Short Term/Long Term Goal(s): Get medications stabilized, wants to be back on what she was on before meds changed.  Discharge Plan or Barriers: Discharge home with her son and follow up with Dr. Einar Grad at [place].  Reason for Continuation of Hospitalization: Delusions  Hallucinations Medication stabilization  Estimated Length of Stay: 3-5 days  Recreational Therapy: Patient Stressors: N/A Patient Goal: Patient will attend and participate in Recreation Therapy Group Sessions x 5 days.   Attendees: Patient:Lauren [name] 09/10/2017 2:36 PM  Physician: Orson Slick, MD 09/10/2017 2:36 PM  Nursing:  09/10/2017 2:36 PM  RN Care Manager: 09/10/2017 2:36 PM  Social Worker: Dossie Arbour, LCSW 09/10/2017 2:36 PM  Recreational Therapist: Roanna Epley, LRT 09/10/2017 2:36 PM  Other:  09/10/2017 2:36 PM  Other:  09/10/2017 2:36 PM  Other: 09/10/2017 2:36 PM    Scribe for Treatment Team: [name], LCSW 09/10/2017 2:36 PM

## 2017-09-11 ENCOUNTER — Ambulatory Visit: Payer: 59 | Admitting: Psychiatry

## 2017-09-11 NOTE — Progress Notes (Signed)
Recreation Therapy Notes  Date: 01.03.2019  Time: 9:30 am  Location: Craft Room  Behavioral response: Appropriate  Intervention Topic: Values  Discussion/Intervention: Group content today was focused on values. The group identified what values are and where they come from. Individuals expressed some values and how many they have. Patients described how they go about add or removing values. The group described the importance of having values and how they go about using them in daily life. Patient participated in the intervention "My Values" where they were able to pick out values that were important to them and make a visual aide.  Clinical Observations/Feedback:  Patient came to group late due to unknown reasons. She participated in the intervention and is continuing to work on her goals. Cylan Borum LRT/CTRS         Michalla Ringer 09/11/2017 10:26 AM

## 2017-09-11 NOTE — Progress Notes (Signed)
Received Lauren [name] this am after breakfast, she was compliant with her medications. She verbalized the voices patterns are saying "arrows of my ways." She stated her depression and anxiety is better. She denied feeling suicidal. She is OOB for her meals only this AM. She is sitting in the day room with her peers quietly.

## 2017-09-11 NOTE — BHH Group Notes (Signed)
LCSW Group Therapy Note 09/11/2017 9:00 AM  Type of Therapy and Topic:  Group Therapy:  Setting Goals  Participation Level:  Minimal  Description of Group: In this process group, patients discussed using strengths to work toward goals and address challenges.  Patients identified two positive things about themselves and one goal they were working on.  Patients were given the opportunity to share openly and support each other's plan for self-empowerment.  The group discussed the value of gratitude and were encouraged to have a daily reflection of positive characteristics or circumstances.  Patients were encouraged to identify a plan to utilize their strengths to work on current challenges and goals.  Therapeutic Goals 1. Patient will verbalize personal strengths/positive qualities and relate how these can assist with achieving desired personal goals 2. Patients will verbalize affirmation of peers plans for personal change and goal setting 3. Patients will explore the value of gratitude and positive focus as related to successful achievement of goals 4. Patients will verbalize a plan for regular reinforcement of personal positive qualities and circumstances.  Summary of Patient Progress: Lauren [name] did not participate much in process group today only talking when asked a question by CSWs.  She shared that she understand how to establish SMART goals.  She identified her goal for today as "learn to better distinguish right and wrong".     Therapeutic Modalities Cognitive Behavioral Therapy Motivational Interviewing    Lauren [name], Lauren [name] 09/11/2017 4:47 PM

## 2017-09-11 NOTE — Progress Notes (Signed)
[facility] MD Progress Note  09/11/2017 1:32 PM Lauren [name]  MRN:  [id number]  Subjective:  Lauren [name] is very paranoid and angry with me. She insists that she is here voluntarily and demands discharge. She has been taking medications, Mo tremor from haldol. Still unwilling to talk to her son.  Treatment plan. We continue Haldol 5 mg woth Amanatadine 100 mg BID along with Zoloft. She would benefit from mood stabilizer.  Social/disposition. She lives with her son and follows up with [facility].  Principal Problem: Schizophrenia, undifferentiated ([place]) Diagnosis:   Patient Active Problem List   Diagnosis Date Noted  . Schizophrenia, undifferentiated ([place]) [F20.3] 09/08/2017    Priority: High  . Screening for cervical cancer [Z12.4] 07/29/2017  . 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 Gravida 1 [IMO0002] 10/26/2015  . Dentagra [K08.89] 10/26/2015  . Itch of skin [L29.9] 10/26/2015  . Sex counseling [Z70.9] 10/26/2015  . Excessive sweating [R61] 07/05/2015  . Acid reflux [K21.9] 06/29/2015  . Encounter for pre-employment examination [Z02.1] 06/29/2015  . Diabetes mellitus type 2, controlled, without complications ([place]) [H60.7] 06/29/2015  . Cardiac murmur [R01.1] 06/29/2015  . Arthritis of knee, degenerative [M17.10] 06/28/2015  . Stiffness of both knees [M25.661, M25.662] 06/22/2015  . Insomnia w/ sleep apnea [G47.00, G47.30] 03/21/2015  . Anxiety disorder [F41.9] 03/21/2015  . Hypertension [I10] 03/21/2015  . HLD (hyperlipidemia) [E78.5] 03/21/2015  . Paranoid schizophrenia ([place]) [F20.0] 03/21/2015  . Urge incontinence [N39.41] 03/21/2015  . Breathlessness on exertion [R06.81] 02/02/2014  . Awareness of heartbeats [R00.2] 02/02/2014  . Apnea, sleep [G47.30] 02/02/2014   Total Time spent with patient: 20 minutes  Past Psychiatric History: schizophrenia  Past Medical History:  Past Medical History:  Diagnosis Date  . Anxiety   .  Diabetes mellitus   . Fibromyalgia   . GERD (gastroesophageal reflux disease)   . Heart murmur   . Herniated disc   . Hyperlipidemia   . Hypertension   . Lack of bladder control   . Lung tumor   . Rheumatoid arthritis ([place])   . Schizoaffective disorder ([place])   . 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 last seizure 2013!  [name] Sleep apnea     Past Surgical History:  Procedure Laterality Date  . BRAIN TUMOR EXCISION  2012   benign  . CARDIAC CATHETERIZATION  2008  . CESAREAN SECTION    . CYST REMOVAL HAND    . LUNG LOBECTOMY  1977   benign tumor  . 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 Heart disease Brother   . Depression Mother   . Heart attack Mother   . Stroke Mother   . Alcohol abuse Father   . Stroke Father   . Diabetes Sister   . Diabetes Sister   . Stomach cancer Sister   . Kidney disease Sister   . COPD Brother   . Lung cancer Brother   . Diabetes Brother    Family Psychiatric  History: none reported Social History:  Social History   Substance and Sexual Activity  Alcohol Use No  . Alcohol/week: 0.0 oz     Social History   Substance and Sexual Activity  Drug Use No    Social History   Socioeconomic History  . Marital  status: Single    Spouse name: None  . Number of children: None  . Years of education: None  . Highest education level: None  Social Needs  . Financial resource strain: Not hard at all  . Food insecurity - worry: Never true  . Food insecurity - inability: Never true  . Transportation needs - medical: No  . Transportation needs - non-medical: No  Occupational History  . None  Tobacco Use  . Smoking status: Never Smoker  . Smokeless tobacco: Never Used  Substance and Sexual Activity  . Alcohol use: No    Alcohol/week: 0.0 oz   . Drug use: No  . Sexual activity: No  Other Topics Concern  . None  Social History Narrative  . None   Additional Social History:                         Sleep: Fair  Appetite:  Fair  Current Medications: Current Facility-Administered Medications  Medication Dose Route Frequency Provider Last Rate Last Dose  . acetaminophen (TYLENOL) tablet 650 mg  650 mg Oral Q6H PRN [name], [name], MD      . alum & mag hydroxide-simeth (MAALOX/MYLANTA) 200-200-20 MG/5ML suspension 30 mL  30 mL Oral Q4H PRN [name], [name], MD      . amantadine (SYMMETREL) capsule 100 mg  100 mg Oral BID Prima Rayner B, MD   100 mg at 09/11/17 0852  . aspirin EC tablet 81 mg  81 mg Oral QAC breakfast [name], [name], MD   81 mg at 09/11/17 0852  . atenolol (TENORMIN) tablet 100 mg  100 mg Oral Daily [name], [name], MD   100 mg at 09/11/17 1751  . haloperidol (HALDOL) tablet 5 mg  5 mg Oral QHS [name], [name], MD   5 mg at 09/10/17 2119  . lisinopril (PRINIVIL,ZESTRIL) tablet 20 mg  20 mg Oral Daily [name], [name], MD   20 mg at 09/11/17 0258   And  . hydrochlorothiazide (HYDRODIURIL) tablet 25 mg  25 mg Oral Daily [name], [name], MD   25 mg at 09/11/17 5277  . hydrOXYzine (ATARAX/VISTARIL) tablet 25 mg  25 mg Oral TID PRN [name], [name], MD      . ibuprofen (ADVIL,MOTRIN) tablet 600 mg  600 mg Oral Q8H PRN [name], [name], MD      . LORazepam (ATIVAN) tablet 0.5 mg  0.5 mg Oral Q4H PRN [name], [name], MD      . magnesium hydroxide (MILK OF MAGNESIA) suspension 30 mL  30 mL Oral Daily PRN [name], [name], MD      . metFORMIN (GLUCOPHAGE) tablet 500 mg  500 mg Oral BID WC [name], [name], MD   500 mg at 09/11/17 8242  . rosuvastatin (CRESTOR) tablet 40 mg  40 mg Oral Daily [name], [name], MD   40 mg at 09/11/17 0852  . sertraline (ZOLOFT) tablet 50 mg  50 mg Oral Daily [name], [name], MD   50 mg at 09/11/17 0852  . traZODone (DESYREL) tablet 100 mg  100 mg Oral QHS PRN [name], [name], MD         Lab Results: No results found for this or any previous visit (from the past 48 hour(s)).  Blood Alcohol level:  Lab Results  Component Value Date   [facility] <10 09/06/2017   ETH <10 35/36/1443    Metabolic Disorder Labs: Lab Results  Component Value  Date   HGBA1C 6.2 (H) 09/09/2017   MPG 131.24 09/09/2017   No results found for: PROLACTIN Lab Results  Component Value Date   CHOL 211 (H) 09/09/2017   TRIG 102 09/09/2017   HDL 38 (L) 09/09/2017   CHOLHDL 5.6 09/09/2017   VLDL 20 09/09/2017   LDLCALC 153 (H) 09/09/2017   LDLCALC 102 (H) 12/20/2016    Physical Findings: AIMS:  , ,  ,  ,    CIWA:    COWS:     Musculoskeletal: Strength & Muscle Tone: within normal limits Gait & Station: normal Patient leans: N/A  Psychiatric Specialty Exam: Physical Exam  Nursing note and vitals reviewed. Psychiatric: Her affect is angry. Her speech is rapid and/or pressured. She is agitated. Thought content is paranoid and delusional. Cognition and memory are impaired. She expresses impulsivity.    Review of Systems  Neurological: Negative.   Psychiatric/Behavioral: Positive for hallucinations.  All other systems reviewed and are negative.   Blood pressure 119/67, pulse 70, temperature 98.2 F (36.8 C), temperature source Oral, resp. rate 18, height 5\' 4"  (1.626 m), weight 108 kg (238 lb), SpO2 99 %.Body mass index is 40.85 kg/m.  General Appearance: Disheveled  Eye Contact:  Minimal  Speech:  Blocked  Volume:  Normal  Mood:  Angry, Dysphoric and Irritable  Affect:  Congruent  Thought Process:  Goal Directed and Descriptions of Associations: Tangential  Orientation:  Full (Time, Place, and Person)  Thought Content:  Delusions and Paranoid Ideation  Suicidal Thoughts:  No  Homicidal Thoughts:  No  Memory:  Immediate;   Fair Recent;   Fair Remote;   Fair  Judgement:  Poor  Insight:  Lacking  Psychomotor Activity:  Psychomotor Retardation  Concentration:  Concentration:  Fair and Attention Span: Fair  Recall:  [employer] of Knowledge:  Fair  Language:  Fair  Akathisia:  No  Handed:  Right  AIMS (if indicated):     Assets:  Communication Skills Desire for Improvement Financial Resources/Insurance Housing Physical Health Resilience Social Support  ADL's:  Intact  Cognition:  WNL  Sleep:  Number of Hours: 8.75     Treatment Plan Summary: Daily contact with patient to assess and evaluate symptoms and progress in treatment and Medication management   Ms. Hardie is a 67 year old female with a history of schizophrenia admitted for paranoia in the context of medication changes and treatment noncompliance.  #Psychosis -restart Haldol 5 mg nightly -continue Zoloft 50 mg daily -start Amantadine 100 mg BID  #Insomnia -increase Trazodone to 150 mg nightly  #Anxiety Vistaril is available  #HTN -continue Atenolol 100 mg, Lisinopril 20 mg and HCTZ 25 mg daily -continue ASA 81 mg daily  #DM -continue Metformin 500 mg BID -continue Crestor 40 mg daily  #Metabolic syndrome monitoring -Lipid panel and TSH are normal HgbA1C 6.2 -EKG, QTc 466  #Disposition -discharge to home with her son -follow up with [facility]     Orson Slick, MD 09/11/2017, 1:32 PM

## 2017-09-11 NOTE — BHH Group Notes (Signed)
[place] Group Notes:  (Nursing/MHT/Case Management/Adjunct)  Date:  09/11/2017  Time:  9:34 PM  Type of Therapy:  Group Therapy  Participation Level:  Did Not Attend  Participation Quality:  Pt sick  Affect: Summary of Progress/Problems:  Nehemiah Settle 09/11/2017, 9:34 PM

## 2017-09-11 NOTE — B[facility] Group Notes (Signed)
09/11/2017 1PM  Type of Therapy/Topic:  Group Therapy:  Balance in Life  Participation Level:  Did Not Attend  Description of Group:   This group will address the concept of balance and how it feels and looks when one is unbalanced. Patients will be encouraged to process areas in their lives that are out of balance and identify reasons for remaining unbalanced. Facilitators will guide patients in utilizing problem-solving interventions to address and correct the stressor making their life unbalanced. Understanding and applying boundaries will be explored and addressed for obtaining and maintaining a balanced life. Patients will be encouraged to explore ways to assertively make their unbalanced needs known to significant others in their lives, using other group members and facilitator for support and feedback.  Therapeutic Goals: 1. Patient will identify two or more emotions or situations they have that consume much of in their lives. 2. Patient will identify signs/triggers that life has become out of balance:  3. Patient will identify two ways to set boundaries in order to achieve balance in their lives:  4. Patient will demonstrate ability to communicate their needs through discussion and/or role plays  Summary of Patient Progress:  Patient was encouraged and invited to attend group. Patient did not attend group. Social worker will continue to encourage group participation in the future.      Therapeutic Modalities:   Cognitive Behavioral Therapy Solution-Focused Therapy Assertiveness Training  Darin Engels, 

## 2017-09-11 NOTE — BHH Group Notes (Signed)
[place] Group Notes:  (Nursing/MHT/Case Management/Adjunct)  Date:  09/11/2017  Time:  2:13 AM  Type of Therapy:  Psychoeducational Skills  Participation Level:  Did Not Attend   Joretta Bachelor 09/11/2017, 2:13 AM

## 2017-09-12 MED ORDER — HALOPERIDOL 5 MG PO TABS
10.0000 mg | ORAL_TABLET | Freq: Every day | ORAL | Status: DC
  Administered 2017-09-12 – 2017-09-14 (×4): 10 mg via ORAL
  Filled 2017-09-12 (×5): qty 2

## 2017-09-12 MED ORDER — DIVALPROEX SODIUM 500 MG PO DR TAB
500.0000 mg | DELAYED_RELEASE_TABLET | Freq: Three times a day (TID) | ORAL | Status: DC
  Administered 2017-09-12 (×2): 500 mg via ORAL
  Filled 2017-09-12 (×4): qty 1

## 2017-09-12 NOTE — B[facility] Group Notes (Signed)

## 2017-09-12 NOTE — Progress Notes (Signed)
A Lauren Place MD Progress Note  09/12/2017 2:06 PM [name]  MRN:  [id number]  Subjective:    Lauren [name] is irritable, contrary and paranoid today. She also is likely to hallucinate under her covers all day long. She again demanded discharge today and questioned her commitment. I gave her a copy of commitment form. She did not get in touch with her son. We were unable to talk to him either. She debnies any symptoms or side effects. Sleep and appetite are fair.  Treatment plan. She is on Haldol 5 mg. I will increase to 10 mg nightly and start Depakote 500 mg tid.  Social/disposition. She will return home. Follow up with [facility].  Principal Problem: Schizophrenia, undifferentiated ([place]) Diagnosis:   Patient Active Problem List   Diagnosis Date Noted  . Schizophrenia, undifferentiated ([place]) [F20.3] 09/08/2017    Priority: High  . Screening for cervical cancer [Z12.4] 07/29/2017  . 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 Gravida 1 [IMO0002] 10/26/2015  . Dentagra [K08.89] 10/26/2015  . Itch of skin [L29.9] 10/26/2015  . Sex counseling [Z70.9] 10/26/2015  . Excessive sweating [R61] 07/05/2015  . Acid reflux [K21.9] 06/29/2015  . Encounter for pre-employment examination [Z02.1] 06/29/2015  . Diabetes mellitus type 2, controlled, without complications ([place]) [J62.8] 06/29/2015  . Cardiac murmur [R01.1] 06/29/2015  . Arthritis of knee, degenerative [M17.10] 06/28/2015  . Stiffness of both knees [M25.661, M25.662] 06/22/2015  . Insomnia w/ sleep apnea [G47.00, G47.30] 03/21/2015  . Anxiety disorder [F41.9] 03/21/2015  . Hypertension [I10] 03/21/2015  . HLD (hyperlipidemia) [E78.5] 03/21/2015  . Paranoid schizophrenia ([place]) [F20.0] 03/21/2015  . Urge incontinence [N39.41] 03/21/2015  . Breathlessness on exertion [R06.81] 02/02/2014  . Awareness of heartbeats [R00.2] 02/02/2014  . Apnea, sleep [G47.30] 02/02/2014   Total Time spent with patient: 20  minutes  Past Psychiatric History: schizophrenia  Past Medical History:  Past Medical History:  Diagnosis Date  . Anxiety   . Diabetes mellitus   . Fibromyalgia   . GERD (gastroesophageal reflux disease)   . Heart murmur   . Herniated disc   . Hyperlipidemia   . Hypertension   . Lack of bladder control   . Lung tumor   . Rheumatoid arthritis ([place])   . Schizoaffective disorder ([place])   . Seizure [facility])    after brain surgery 2012. last seizure 2013!  [name] Sleep apnea     Past Surgical History:  Procedure Laterality Date  . BRAIN TUMOR EXCISION  2012   benign  . CARDIAC CATHETERIZATION  2008  . CESAREAN SECTION    . CYST REMOVAL HAND    . LUNG LOBECTOMY  1977   benign tumor  . 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 Heart disease Brother   . Depression Mother   . Heart attack Mother   . Stroke Mother   . Alcohol abuse Father   . Stroke Father   . Diabetes Sister   . Diabetes Sister   . Stomach cancer Sister   . Kidney disease Sister   . COPD Brother   . Lung cancer Brother   . Diabetes Brother    Family Psychiatric  History: none reported Social History:  Social History   Substance and Sexual Activity  Alcohol Use No  .  Alcohol/week: 0.0 oz     Social History   Substance and Sexual Activity  Drug Use No    Social History   Socioeconomic History  . Marital status: Single    Spouse name: None  . Number of children: None  . Years of education: None  . Highest education level: None  Social Needs  . Financial resource strain: Not hard at all  . Food insecurity - worry: Never true  . Food insecurity - inability: Never true  . Transportation needs - medical: No  . Transportation needs - non-medical: No  Occupational History  . None  Tobacco Use  . Smoking  status: Never Smoker  . Smokeless tobacco: Never Used  Substance and Sexual Activity  . Alcohol use: No    Alcohol/week: 0.0 oz  . Drug use: No  . Sexual activity: No  Other Topics Concern  . None  Social History Narrative  . None   Additional Social History:                         Sleep: Fair  Appetite:  Fair  Current Medications: Current Facility-Administered Medications  Medication Dose Route Frequency Provider Last Rate Last Dose  . acetaminophen (TYLENOL) tablet 650 mg  650 mg Oral Q6H PRN [name], [name], MD      . alum & mag hydroxide-simeth (MAALOX/MYLANTA) 200-200-20 MG/5ML suspension 30 mL  30 mL Oral Q4H PRN [name], [name], MD   30 mL at 09/11/17 1952  . amantadine (SYMMETREL) capsule 100 mg  100 mg Oral BID [name], [name], MD   100 mg at 09/11/17 1722  . aspirin EC tablet 81 mg  81 mg Oral QAC breakfast [name], [name], MD   81 mg at 09/12/17 0836  . atenolol (TENORMIN) tablet 100 mg  100 mg Oral Daily [name], [name], MD   100 mg at 09/12/17 0835  . haloperidol (HALDOL) tablet 5 mg  5 mg Oral QHS [name], [name], MD   5 mg at 09/11/17 2112  . lisinopril (PRINIVIL,ZESTRIL) tablet 20 mg  20 mg Oral Daily [name], [name], MD   20 mg at 09/12/17 7169   And  . hydrochlorothiazide (HYDRODIURIL) tablet 25 mg  25 mg Oral Daily [name], [name], MD   25 mg at 09/12/17 6789  . hydrOXYzine (ATARAX/VISTARIL) tablet 25 mg  25 mg Oral TID PRN [name], [name], MD      . ibuprofen (ADVIL,MOTRIN) tablet 600 mg  600 mg Oral Q8H PRN [name], [name], MD      . LORazepam (ATIVAN) tablet 0.5 mg  0.5 mg Oral Q4H PRN [name], [name], MD      . magnesium hydroxide (MILK OF MAGNESIA) suspension 30 mL  30 mL Oral Daily PRN [name], [name], MD      . metFORMIN (GLUCOPHAGE) tablet 500 mg  500 mg Oral BID WC [name], [name], MD   500 mg at 09/12/17 0835  . rosuvastatin (CRESTOR) tablet 40 mg  40 mg Oral Daily [name], [name], MD   40 mg at 09/12/17 0835  . sertraline (ZOLOFT)  tablet 50 mg  50 mg Oral Daily [name], [name], MD   50 mg at 09/12/17 0835  . traZODone (DESYREL) tablet 100 mg  100 mg Oral QHS PRN [name], [name], MD        Lab Results: No results found for this or any previous visit (from the past  48 hour(s)).  Blood Alcohol level:  Lab Results  Component Value Date   ETH <10 09/06/2017   ETH <10 33/54/5625    Metabolic Disorder Labs: Lab Results  Component Value Date   HGBA1C 6.2 (H) 09/09/2017   MPG 131.24 09/09/2017   No results found for: PROLACTIN Lab Results  Component Value Date   CHOL 211 (H) 09/09/2017   TRIG 102 09/09/2017   HDL 38 (L) 09/09/2017   CHOLHDL 5.6 09/09/2017   VLDL 20 09/09/2017   LDLCALC 153 (H) 09/09/2017   LDLCALC 102 (H) 12/20/2016    Physical Findings: AIMS:  , ,  ,  ,    CIWA:    COWS:     Musculoskeletal: Strength & Muscle Tone: within normal limits Gait & Station: normal Patient leans: N/A  Psychiatric Specialty Exam: Physical Exam  Nursing note and vitals reviewed. Psychiatric: Her affect is labile. Her speech is delayed. She is slowed and withdrawn. Thought content is paranoid and delusional. Cognition and memory are normal. She expresses impulsivity.    Review of Systems  Neurological: Negative.   Psychiatric/Behavioral: Positive for hallucinations.  All other systems reviewed and are negative.   Blood pressure 117/60, pulse 63, temperature 98.2 F (36.8 C), temperature source Oral, resp. rate 18, height 5\' 4"  (1.626 m), weight 108 kg (238 lb), SpO2 99 %.Body mass index is 40.85 kg/m.  General Appearance: Disheveled  Eye Contact:  Poor  Speech:  Blocked  Volume:  Normal  Mood:  Dysphoric and Irritable  Affect:  Congruent  Thought Process:  Goal Directed and Descriptions of Associations: Intact  Orientation:  Full (Time, [name], and Person)  Thought Content:  Delusions, Hallucinations: Auditory and Paranoid Ideation  Suicidal Thoughts:  No  Homicidal Thoughts:  No  Memory:   Immediate;   Fair Recent;   Fair Remote;   Fair  Judgement:  Poor  Insight:  Lacking  Psychomotor Activity:  Psychomotor Retardation  Concentration:  Concentration: Fair and Attention Span: Fair  Recall:  [employer] of Knowledge:  Fair  Language:  Fair  Akathisia:  No  Handed:  Right  AIMS (if indicated):     Assets:  Communication Skills Desire for Improvement Financial Resources/Insurance Housing Physical Health Resilience Social Support  ADL's:  Intact  Cognition:  WNL  Sleep:  Number of Hours: 7.5     Treatment Plan Summary: Daily contact with patient to assess and evaluate symptoms and progress in treatment and Medication management    Lauren [name] is a 67 year old female with a history of schizophrenia admitted for paranoia in the context of medication changes and treatment noncompliance.  #Psychosis, not improving -increase Haldol to 10 mg nightly -continue Zoloft 50 mg daily -continue Amantadine 100 mg BID -start Depakote 500 mg TID  #Insomnia -increase Trazodone to 150 mg nightly  #Anxiety Vistaril is available  #HTN -continue Atenolol 100 mg, Lisinopril 20 mg and HCTZ 25 mg daily -continue ASA 81 mg daily  #DM -continue Metformin 500 mg BID -continue Crestor 40 mg daily  #Metabolic syndrome monitoring -Lipid panel and TSH are normal HgbA1C 6.2 -EKG, QTc 466  #Disposition -discharge to home with her son -follow up with [facility]      Orson Slick, MD 09/12/2017, 2:06 PM

## 2017-09-12 NOTE — Progress Notes (Signed)
Received Lauren [name] this am in her room, she refused breakfast stating her stomach is upset. She was given a ginger ale, crackers and a banana. She consumed the ginger ale with her medication this am. She also refused lunch and has been in the bed all day. She refused to take her Symmetrel because she could not remember taking the capsule and the color. She has denied all of the psychiatric symptoms and very evasive today.

## 2017-09-12 NOTE — Progress Notes (Addendum)
Recreation Therapy Notes  Date: 01.04.2019  Time:  9:30 am  Location: Craft Room  Behavioral response: N/A  Intervention Topic: Communication  Discussion/Intervention: Patient did not attend group.  Clinical Observations/Feedback:  Patient did not attend group.  Joren Rehm LRT/CTRS           Christiaan Strebeck 09/12/2017 12:37 PM

## 2017-09-12 NOTE — Progress Notes (Addendum)
Patient found in bed upon my arrival with emesis all over the floor. Patient apparently ate large meal and became nauseous and threw up x1 all over her room floor. Given Maalox with reported relief. Patient remains isolative to her room throughout the evening. Patient hygiene is extremely poor/malodorous, encouraged a shower but patient refused. Patient reports her appetite has been good all day until vomiting. Reports large BM. Denies pain. Denies SI, HI, VH, and anxiety. Reports continued AH. Mood is depressed, affect flat, and eye contact poor. Patient is not progressing in her goals and is avoidant when this is discussed. Patient is minimally verbal throughout assessment. Compliant with HS Haldol but would not get out of bed to receive it. Refused snack due to nausea. Educated patient regarding proper portion size. Requires reinforcement. Q 15 minute checks maintained. Will continue to monitor throughout the shift.  Patient slept through the night, no further gastric upset reported. Compliant with am vitals. Will endorse care to oncoming shift.

## 2017-09-12 NOTE — [facility] Group Notes (Signed)
[place] Group Notes:  (Nursing/MHT/Case Management/Adjunct)  Date:  09/12/2017  Time:  9:17 PM  Type of Therapy:  Group Therapy  Participation Level:  Did Not Attend  Participation Qual Modes of Intervention:  Nehemiah Settle 09/12/2017, 9:17 PM

## 2017-09-12 NOTE — Plan of Care (Signed)
  Not Progressing Education: Knowledge of [place] Education information/materials will improve 09/12/2017 0045 - Not Progressing by Derek Mound, RN Emotional status will improve 09/12/2017 0045 - Not Progressing by Derek Mound, RN Mental status will improve 09/12/2017 0045 - Not Progressing by Derek Mound, RN Verbalization of understanding the information provided will improve 09/12/2017 0045 - Not Progressing by Derek Mound, RN Coping: Ability to cope will improve 09/12/2017 0045 - Not Progressing by Derek Mound, RN Ability to verbalize feelings will improve 09/12/2017 0045 - Not Progressing by Derek Mound, RN Health Behavior/Discharge Planning: Compliance with prescribed medication regimen will improve 09/12/2017 0045 - Not Progressing by Derek Mound, RN Role Relationship: Ability to communicate needs accurately will improve 09/12/2017 0045 - Not Progressing by Derek Mound, RN Ability to interact with others will improve 09/12/2017 0045 - Not Progressing by Derek Mound, RN Safety: Ability to redirect hostility and anger into socially appropriate behaviors will improve 09/12/2017 0045 - Not Progressing by Derek Mound, RN Ability to remain free from injury will improve 09/12/2017 0045 - Not Progressing by Derek Mound, RN Self-Care: Ability to participate in self-care as condition permits will improve 09/12/2017 0045 - Not Progressing by Derek Mound, RN Self-Concept: Ability to verbalize positive feelings about self will improve 09/12/2017 0045 - Not Progressing by Derek Mound, RN Activity: Sleeping patterns will improve 09/12/2017 0045 - Not Progressing by Derek Mound, RN Nutrition: Adequate nutrition will be maintained 09/12/2017 0045 - Not Progressing by Derek Mound, RN Coping: Level of anxiety will decrease 09/12/2017 0045 - Not Progressing by Derek Mound, RN Safety: Ability to remain free from injury will improve 09/12/2017  0045 - Not Progressing by Derek Mound, RN

## 2017-09-13 NOTE — Plan of Care (Signed)
Pt needs reinforcement on provided education. Pt able to remain safe while on the unit. Pt verbally contracts for safety. Pt denies SI/HI. Pt reports sleeping, "good". Pt not attending groups and is isolative. Pt not eating and has poor nutritional intake.

## 2017-09-13 NOTE — Progress Notes (Signed)
On upon awakening during early morning end of shift medication pass @0600  pt. was attempted to be given routine scheduled depakote, but pt. Refused. Pt stated, "I don't want that medication it hurts my stomach when I take it". The pt. After being offered food and drink to take with the medication to reduce discomfort stated, "I don't want any food, the food is all poison... I can feel it in my finger tips" and, "that medicine is poison.[name] Kitchenit's just no good". Will update care team in morning report.

## 2017-09-13 NOTE — Plan of Care (Signed)
Pt. Verbally contracts for safety. Pt denies SI/HI. Pt able to remain safe while on the unit. Pt. Sleeping good per sleeping reports and safety roundings. Pt. Not progressing with patient education. Pt not eating. Nutritional intake very poor. Pt. Is very paranoid and not willing to eat. Pt not compliant with medications due to paranoia.

## 2017-09-13 NOTE — Progress Notes (Signed)
D:Pt denies SI/HI/AVH. Pt is flat, minimally interactive, and isolates to her room. Pt has no complaints this evening. Pt not eating and has poor nutritional intake. Pt sleeping well. Pt not attending groups. Pt verbally contracts for safety. Pt needs reinforcement on provided education. Pt able to remain safe while on the unit.    A: Q x 15 minute observation checks were completed for safety. Patient was provided with education. Patient was given scheduled medications. Patient  was encourage to attend groups, participate in unit activities and continue with plan of care.   R:Patient is complaint with medication and unit procedures. Pt given education on fall prevention and educated to call for help if she needs aid.             Patient slept for Estimated Hours of 7.45; Precautionary checks every 15 minutes for safety maintained, room free of safety hazards, patient sustains no injury or falls during this shift.

## 2017-09-13 NOTE — B[facility] Group Notes (Signed)

## 2017-09-13 NOTE — Progress Notes (Addendum)
[facility] MD Progress Note  09/13/2017 6:29 PM Lauren [name]  MRN:  [id number]  Subjective:    Lauren [name] remains paranoid and delusional today. He did take Haldol last night but then refused medications this morning. Lauren [name] says Lauren [name] is afraid Lauren [name] will vomit if Lauren [name] takes the medications. Lauren [name] is endorsing some paranoid thoughts about Lauren [name] doctor as an outpatient. The patient does not feel like Lauren [name] doctors are treating Lauren [name] the way that Lauren [name] deserves to be treated. Lauren [name] denies any current active or passive suicidal thoughts. Lauren [name] denies any auditory or visual hallucinations. The patient has been fairly isolative and does not interact with staff or peers. Lauren [name] wants to take medication and Haldol because Lauren [name] fears it will make Lauren [name] vomit. Lauren [name] denies any somatic complaints.VSS.   Past psychiatric history. Long history of mental illness with multiple medication trials but the patient unable to name any except Seroquel. Lauren [name] was on Geodon few years back. Lauren [name] has never been on injectable. Lauren [name] last hospitalization was probably 10 years ago. Lauren [name] reports one suicide attempt "a long time ago".   Family psychiatric history. Aunt with schizophrenia "a long time ago".   Social history. Lauren [name] is disabled from mental illness. Lauren [name] lives with Lauren [name] son and reports that this is a good arrangement.   Principal Problem: Schizophrenia, undifferentiated ([place]) Diagnosis:   Patient Active Problem List   Diagnosis Date Noted  . Schizophrenia, undifferentiated ([place]) [F20.3] 09/08/2017  . Screening for cervical cancer [Z12.4] 07/29/2017  . 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 Gravida 1 [IMO0002] 10/26/2015  . Dentagra [K08.89] 10/26/2015  . Itch of skin [L29.9] 10/26/2015  . Sex counseling [Z70.9] 10/26/2015  . Excessive sweating [R61] 07/05/2015  . Acid reflux [K21.9] 06/29/2015  . Encounter for pre-employment examination [Z02.1] 06/29/2015  . Diabetes mellitus type 2,  controlled, without complications ([place]) [R44.3] 06/29/2015  . Cardiac murmur [R01.1] 06/29/2015  . Arthritis of knee, degenerative [M17.10] 06/28/2015  . Stiffness of both knees [M25.661, M25.662] 06/22/2015  . Insomnia w/ sleep apnea [G47.00, G47.30] 03/21/2015  . Anxiety disorder [F41.9] 03/21/2015  . Hypertension [I10] 03/21/2015  . HLD (hyperlipidemia) [E78.5] 03/21/2015  . Paranoid schizophrenia ([place]) [F20.0] 03/21/2015  . Urge incontinence [N39.41] 03/21/2015  . Breathlessness on exertion [R06.81] 02/02/2014  . Awareness of heartbeats [R00.2] 02/02/2014  . Apnea, sleep [G47.30] 02/02/2014   Total Time spent with patient: 20 minutes  Past Psychiatric History: schizophrenia  Past Medical History:  Past Medical History:  Diagnosis Date  . Anxiety   . Diabetes mellitus   . Fibromyalgia   . GERD (gastroesophageal reflux disease)   . Heart murmur   . Herniated disc   . Hyperlipidemia   . Hypertension   . Lack of bladder control   . Lung tumor   . Rheumatoid arthritis ([place])   . Schizoaffective disorder ([place])   . Seizure [facility])    after brain surgery 2012. last seizure 2013!  [name] Sleep apnea     Past Surgical History:  Procedure Laterality Date  . BRAIN TUMOR EXCISION  2012   benign  . CARDIAC CATHETERIZATION  2008  . CESAREAN SECTION    . CYST REMOVAL HAND    . LUNG LOBECTOMY  1977   benign tumor  . TOTAL KNEE ARTHROPLASTY Left 01/28/2017   Procedure: TOTAL KNEE ARTHROPLASTY;  Surgeon: Corky Mull, MD;  Location: [facility] ORS;  Service: Orthopedics;  Laterality: Left;   Family History:  Family History  Problem Relation  Age of Onset  . Heart disease Brother   . Depression Mother   . Heart attack Mother   . Stroke Mother   . Alcohol abuse Father   . Stroke Father   . Diabetes Sister   . Diabetes Sister   . Stomach cancer Sister   . Kidney disease Sister   . COPD Brother   . Lung cancer Brother   . Diabetes Brother    Family Psychiatric  History: none  reported Social History:  Social History   Substance and Sexual Activity  Alcohol Use No  . Alcohol/week: 0.0 oz     Social History   Substance and Sexual Activity  Drug Use No    Social History   Socioeconomic History  . Marital status: Single    Spouse name: None  . Number of children: None  . Years of education: None  . Highest education level: None  Social Needs  . Financial resource strain: Not hard at all  . Food insecurity - worry: Never true  . Food insecurity - inability: Never true  . Transportation needs - medical: No  . Transportation needs - non-medical: No  Occupational History  . None  Tobacco Use  . Smoking status: Never Smoker  . Smokeless tobacco: Never Used  Substance and Sexual Activity  . Alcohol use: No    Alcohol/week: 0.0 oz  . Drug use: No  . Sexual activity: No  Other Topics Concern  . None  Social History Narrative  . None   - Sleep: Fair  Appetite:  Fair  Current Medications: Current Facility-Administered Medications  Medication Dose Route Frequency Provider Last Rate Last Dose  . acetaminophen (TYLENOL) tablet 650 mg  650 mg Oral Q6H PRN [name], [name], MD      . alum & mag hydroxide-simeth (MAALOX/MYLANTA) 200-200-20 MG/5ML suspension 30 mL  30 mL Oral Q4H PRN [name], [name], MD   30 mL at 09/11/17 1952  . amantadine (SYMMETREL) capsule 100 mg  100 mg Oral BID [name], [name], MD   100 mg at 09/11/17 1722  . aspirin EC tablet 81 mg  81 mg Oral QAC breakfast [name], [name], MD   81 mg at 09/12/17 0836  . atenolol (TENORMIN) tablet 100 mg  100 mg Oral Daily [name], [name], MD   100 mg at 09/12/17 0835  . divalproex (DEPAKOTE) DR tablet 500 mg  500 mg Oral Q8H [name], [name], MD   500 mg at 09/12/17 2122  . haloperidol (HALDOL) tablet 10 mg  10 mg Oral QHS [name], [name], MD   10 mg at 09/12/17 2122  . lisinopril (PRINIVIL,ZESTRIL) tablet 20 mg  20 mg Oral Daily [name], [name], MD   20 mg at 09/12/17 1914    And  . hydrochlorothiazide (HYDRODIURIL) tablet 25 mg  25 mg Oral Daily [name], [name], MD   25 mg at 09/12/17 7829  . hydrOXYzine (ATARAX/VISTARIL) tablet 25 mg  25 mg Oral TID PRN [name], [name], MD      . ibuprofen (ADVIL,MOTRIN) tablet 600 mg  600 mg Oral Q8H PRN [name], [name], MD      . LORazepam (ATIVAN) tablet 0.5 mg  0.5 mg Oral Q4H PRN [name], [name], MD      . magnesium hydroxide (MILK OF MAGNESIA) suspension 30 mL  30 mL Oral Daily PRN [name], [name], MD      . metFORMIN (GLUCOPHAGE) tablet 500 mg  500 mg Oral  BID WC [name], [name], MD   500 mg at 09/12/17 0835  . rosuvastatin (CRESTOR) tablet 40 mg  40 mg Oral Daily [name], [name], MD   40 mg at 09/13/17 1817  . sertraline (ZOLOFT) tablet 50 mg  50 mg Oral Daily [name], [name], MD   50 mg at 09/12/17 0835  . traZODone (DESYREL) tablet 100 mg  100 mg Oral QHS PRN [name], [name], MD        Lab Results: No results found for this or any previous visit (from the past 48 hour(s)).  Blood Alcohol level:  Lab Results  Component Value Date   ETH <10 09/06/2017   ETH <10 26/71/2458    Metabolic Disorder Labs: Lab Results  Component Value Date   HGBA1C 6.2 (H) 09/09/2017   MPG 131.24 09/09/2017   No results found for: PROLACTIN Lab Results  Component Value Date   CHOL 211 (H) 09/09/2017   TRIG 102 09/09/2017   HDL 38 (L) 09/09/2017   CHOLHDL 5.6 09/09/2017   VLDL 20 09/09/2017   LDLCALC 153 (H) 09/09/2017   LDLCALC 102 (H) 12/20/2016    Physical Findings: AIMS: Facial and Oral Movements Muscles of Facial Expression: None, normal Lips and Perioral Area: None, normal Jaw: None, normal Tongue: None, normal,Extremity Movements Upper (arms, wrists, hands, fingers): None, normal Lower (legs, knees, ankles, toes): None, normal, Trunk Movements Neck, shoulders, hips: None, normal, Overall Severity Severity of abnormal movements (highest score from questions above): None, normal Incapacitation due to abnormal  movements: None, normal Patient's awareness of abnormal movements (rate only patient's report): No Awareness, Dental Status Current problems with teeth and/or dentures?: No Does patient usually wear dentures?: No   Musculoskeletal: Strength & Muscle Tone: within normal limits Gait & Station: normal Patient leans: N/A  Psychiatric Specialty Exam: Physical Exam  Nursing note and vitals reviewed. Psychiatric: Lauren [name] affect is labile. Lauren [name] speech is delayed. Lauren [name] is slowed and withdrawn. Thought content is paranoid and delusional. Cognition and memory are normal. Lauren [name] expresses impulsivity.    Review of Systems  Constitutional: Negative.   HENT: Negative.   Eyes: Negative.   Respiratory: Negative.   Cardiovascular: Negative.   Gastrointestinal: Negative.   Genitourinary: Negative.   Musculoskeletal: Negative.   Skin: Negative.   Neurological: Negative.   Endo/Heme/Allergies: Negative.   Psychiatric/Behavioral:       The patient is having paranoid thoughts and is delusional.  All other systems reviewed and are negative.   Blood pressure 114/65, pulse 74, temperature 98.2 F (36.8 C), temperature source Oral, resp. rate 17, height 5\' 4"  (1.626 m), weight 108 kg (238 lb), SpO2 99 %.Body mass index is 40.85 kg/m.  General Appearance: Disheveled  Eye Contact:  Poor  Speech:  Blocked  Volume:  Normal  Mood:  Dysphoric  Affect:  Blunted  Thought Process:  Goal Directed and Descriptions of Associations: Intact  Orientation:  Full (Time, Place, and Person)  Thought Content:  Paranoid and delusional thoughts  Suicidal Thoughts:  No  Homicidal Thoughts:  No  Memory:  Immediate;   Fair Recent;   Fair Remote;   Fair  Judgement:  Poor  Insight:  Lacking  Psychomotor Activity:  Psychomotor Retardation  Concentration:  Concentration: Fair and Attention Span: Fair  Recall:  [employer] of Knowledge:  Fair  Language:  Fair  Akathisia:  No  Handed:  Right  AIMS (if indicated):     Assets:   Communication Skills Desire for Improvement Financial Resources/Insurance  Housing Physical Health Resilience Social Support  ADL's:  Intact  Cognition:  WNL  Sleep:  Number of Hours: 7.5     Treatment Plan Summary:    Daily contact with patient to assess and evaluate symptoms and progress in treatment and Medication management    Lauren [name] is a 67 year old female with a history of schizophrenia admitted for paranoia in the context of medication changes and treatment noncompliance.  #Psychosis, not improving -Cotninue Haldol to 10 mg nightly -continue Zoloft 50 mg daily -continue Amantadine 100 mg BID -Lauren [name] started on Depakote 500 mg TID. Will need to check VPA level  #Insomnia -continue Trazodone to 150 mg nightly  #Anxiety Vistaril is available  #HTN -continue Atenolol 100 mg, Lisinopril 20 mg and HCTZ 25 mg daily -continue ASA 81 mg daily  #DM -continue Metformin 500 mg BID - patient is on a carb modified diet - will monitor BS and place on SSI  #Hyperlipidemia -continue Crestor 40 mg daily  #Metabolic syndrome monitoring -Lipid panel and TSH are normal HgbA1C 6.2 -EKG, QTc 466  #Disposition -discharge to home with Lauren [name] son -follow up with [facility]        Chauncey Mann, MD 09/13/2017, 6:29 PM

## 2017-09-13 NOTE — Plan of Care (Signed)
Patient is alert to self and place. Patient is very paranoid, suspicious, as well as guarded. Patient is noncompliant with medications. Patient believes medications are poison.  Patient appetite is poor. Nurse will continue to encourage eating meals, attendance with groups, and medication compliance. Patient denies pain. Safety checks will continue Q 15 minutes. Education: Knowledge of [place] Education information/materials will improve 09/13/2017 1154 - Progressing by Geraldo Docker, RN Emotional status will improve 09/13/2017 1154 - Not Progressing by Geraldo Docker, RN Mental status will improve 09/13/2017 1154 - Not Progressing by Geraldo Docker, RN Verbalization of understanding the information provided will improve 09/13/2017 1154 - Not Progressing by Geraldo Docker, RN   Activity: Sleeping patterns will improve 09/13/2017 1154 - Progressing by Geraldo Docker, RN   Coping: Ability to cope will improve 09/13/2017 1154 - Not Progressing by Geraldo Docker, RN Ability to verbalize feelings will improve 09/13/2017 1154 - Not Progressing by Geraldo Docker, RN

## 2017-09-14 NOTE — [facility] Group Notes (Signed)
LCSW Group Therapy Note 09/14/2017 1:15pm  Type of Therapy and Topic: Group Therapy: Feelings Around Returning Home & Establishing a Supportive Framework and Supporting Oneself When Supports Not Available  Participation Level: Did Not Attend  Description of Group:  Patients first processed thoughts and feelings about upcoming discharge. These included fears of upcoming changes, lack of change, new living environments, judgements and expectations from others and overall stigma of mental health issues. The group then discussed the definition of a supportive framework, what that looks and feels like, and how do to discern it from an unhealthy non-supportive network. The group identified different types of supports as well as what to do when your family/friends are less than helpful or unavailable  Therapeutic Goals  1. Patient will identify one healthy supportive network that they can use at discharge. 2. Patient will identify one factor of a supportive framework and how to tell it from an unhealthy network. 3. Patient able to identify one coping skill to use when they do not have positive supports from others. 4. Patient will demonstrate ability to communicate their needs through discussion and/or role plays.  Summary of Patient Progress:    Therapeutic Modalities Cognitive Behavioral Therapy Motivational Interviewing   [place], LCSW 09/14/2017 9:52 AM

## 2017-09-14 NOTE — Progress Notes (Signed)
[facility] MD Progress Note  09/14/2017 6:31 PM Anaia Frith  MRN:  [id number]  Subjective:    The patient was not willing to come out of her room to talk to this writer today. She remains paranoid and suspicious of this writer and medications offered in the hospital. She is not fully compliant with Depakote yesterday but did take Haldol. She took Zoloft later in the day. She reports the medications don't make her feel good but cannot give any specifics. She was very vague. She is experiencing some auditory hallucinations but says that they were telling her "the errors of her ways". She denies that they're telling her to hurt herself or anyone else. She denies any visual. She denies any current active or passive suicidal thoughts and does not answer questions directly with regards to depression. VSS.  Past psychiatric history. Long history of mental illness with multiple medication trials but the patient unable to name any except Seroquel. She was on Geodon few years back. She has never been on injectable. Her last hospitalization was probably 10 years ago. She reports one suicide attempt "a long time ago".   Family psychiatric history. Aunt with schizophrenia "a long time ago".   Social history. She is disabled from mental illness. She lives with her son and reports that this is a good arrangement.   Principal Problem: Schizophrenia, undifferentiated ([place]) Diagnosis:   Patient Active Problem List   Diagnosis Date Noted  . Schizophrenia, undifferentiated ([place]) [F20.3] 09/08/2017  . Screening for cervical cancer [Z12.4] 07/29/2017  . 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 Gravida 1 [IMO0002] 10/26/2015  . Dentagra [K08.89] 10/26/2015  . Itch of skin [L29.9] 10/26/2015  . Sex counseling [Z70.9] 10/26/2015  . Excessive sweating [R61] 07/05/2015  . Acid reflux [K21.9] 06/29/2015  . Encounter for pre-employment examination [Z02.1] 06/29/2015  .  Diabetes mellitus type 2, controlled, without complications ([place]) [D22.0] 06/29/2015  . Cardiac murmur [R01.1] 06/29/2015  . Arthritis of knee, degenerative [M17.10] 06/28/2015  . Stiffness of both knees [M25.661, M25.662] 06/22/2015  . Insomnia w/ sleep apnea [G47.00, G47.30] 03/21/2015  . Anxiety disorder [F41.9] 03/21/2015  . Hypertension [I10] 03/21/2015  . HLD (hyperlipidemia) [E78.5] 03/21/2015  . Paranoid schizophrenia ([place]) [F20.0] 03/21/2015  . Urge incontinence [N39.41] 03/21/2015  . Breathlessness on exertion [R06.81] 02/02/2014  . Awareness of heartbeats [R00.2] 02/02/2014  . Apnea, sleep [G47.30] 02/02/2014   Total Time spent with patient: 20 minutes  Past Psychiatric History: schizophrenia  Past Medical History:  Past Medical History:  Diagnosis Date  . Anxiety   . Diabetes mellitus   . Fibromyalgia   . GERD (gastroesophageal reflux disease)   . Heart murmur   . Herniated disc   . Hyperlipidemia   . Hypertension   . Lack of bladder control   . Lung tumor   . Rheumatoid arthritis ([place])   . Schizoaffective disorder ([place])   . Seizure [facility])    after brain surgery 2012. last seizure 2013!  [name] Sleep apnea     Past Surgical History:  Procedure Laterality Date  . BRAIN TUMOR EXCISION  2012   benign  . CARDIAC CATHETERIZATION  2008  . CESAREAN SECTION    . CYST REMOVAL HAND    . LUNG LOBECTOMY  1977   benign tumor  . TOTAL KNEE ARTHROPLASTY Left 01/28/2017   Procedure: TOTAL KNEE ARTHROPLASTY;  Surgeon: Corky Mull, MD;  Location: [facility] ORS;  Service: Orthopedics;  Laterality: Left;  Family History:  Family History  Problem Relation Age of Onset  . Heart disease Brother   . Depression Mother   . Heart attack Mother   . Stroke Mother   . Alcohol abuse Father   . Stroke Father   . Diabetes Sister   . Diabetes Sister   . Stomach cancer Sister   . Kidney disease Sister   . COPD Brother   . Lung cancer Brother   . Diabetes Brother     Social History:   Social History   Substance and Sexual Activity  Alcohol Use No  . Alcohol/week: 0.0 oz     Social History   Substance and Sexual Activity  Drug Use No    Social History   Socioeconomic History  . Marital status: Single    Spouse name: None  . Number of children: None  . Years of education: None  . Highest education level: None  Social Needs  . Financial resource strain: Not hard at all  . Food insecurity - worry: Never true  . Food insecurity - inability: Never true  . Transportation needs - medical: No  . Transportation needs - non-medical: No  Occupational History  . None  Tobacco Use  . Smoking status: Never Smoker  . Smokeless tobacco: Never Used  Substance and Sexual Activity  . Alcohol use: No    Alcohol/week: 0.0 oz  . Drug use: No  . Sexual activity: No  Other Topics Concern  . None  Social History Narrative  . None   - Sleep: Fair  Appetite:  Fair  Current Medications: Current Facility-Administered Medications  Medication Dose Route Frequency Provider Last Rate Last Dose  . acetaminophen (TYLENOL) tablet 650 mg  650 mg Oral Q6H PRN [name], [name], MD      . alum & mag hydroxide-simeth (MAALOX/MYLANTA) 200-200-20 MG/5ML suspension 30 mL  30 mL Oral Q4H PRN [name], [name], MD   30 mL at 09/11/17 1952  . amantadine (SYMMETREL) capsule 100 mg  100 mg Oral BID [name], [name], MD   100 mg at 09/11/17 1722  . aspirin EC tablet 81 mg  81 mg Oral QAC breakfast [name], [name], MD   81 mg at 09/12/17 0836  . atenolol (TENORMIN) tablet 100 mg  100 mg Oral Daily [name], [name], MD   100 mg at 09/12/17 0835  . haloperidol (HALDOL) tablet 10 mg  10 mg Oral QHS [name], [name], MD   10 mg at 09/13/17 2145  . lisinopril (PRINIVIL,ZESTRIL) tablet 20 mg  20 mg Oral Daily [name], [name], MD   20 mg at 09/12/17 2992   And  . hydrochlorothiazide (HYDRODIURIL) tablet 25 mg  25 mg Oral Daily [name], [name], MD   25 mg at 09/12/17 4268  . hydrOXYzine  (ATARAX/VISTARIL) tablet 25 mg  25 mg Oral TID PRN [name], [name], MD      . ibuprofen (ADVIL,MOTRIN) tablet 600 mg  600 mg Oral Q8H PRN [name], [name], MD      . LORazepam (ATIVAN) tablet 0.5 mg  0.5 mg Oral Q4H PRN [name], [name], MD      . magnesium hydroxide (MILK OF MAGNESIA) suspension 30 mL  30 mL Oral Daily PRN [name], [name], MD      . metFORMIN (GLUCOPHAGE) tablet 500 mg  500 mg Oral BID WC [name], [name], MD   500 mg at 09/13/17 1822  . rosuvastatin (CRESTOR) tablet 40 mg  40 mg Oral Daily [name], [name], MD   40 mg at 09/13/17 1823  . sertraline (ZOLOFT) tablet 50 mg  50 mg Oral Daily [name], [name], MD   50 mg at 09/13/17 1822  . traZODone (DESYREL) tablet 100 mg  100 mg Oral QHS PRN [name], [name], MD        Lab Results: No results found for this or any previous visit (from the past 48 hour(s)).  Blood Alcohol level:  Lab Results  Component Value Date   ETH <10 09/06/2017   ETH <10 19/14/7829    Metabolic Disorder Labs: Lab Results  Component Value Date   HGBA1C 6.2 (H) 09/09/2017   MPG 131.24 09/09/2017   No results found for: PROLACTIN Lab Results  Component Value Date   CHOL 211 (H) 09/09/2017   TRIG 102 09/09/2017   HDL 38 (L) 09/09/2017   CHOLHDL 5.6 09/09/2017   VLDL 20 09/09/2017   LDLCALC 153 (H) 09/09/2017   LDLCALC 102 (H) 12/20/2016    Physical Findings: AIMS: Facial and Oral Movements Muscles of Facial Expression: None, normal Lips and Perioral Area: None, normal Jaw: None, normal Tongue: None, normal,Extremity Movements Upper (arms, wrists, hands, fingers): None, normal Lower (legs, knees, ankles, toes): None, normal, Trunk Movements Neck, shoulders, hips: None, normal, Overall Severity Severity of abnormal movements (highest score from questions above): None, normal Incapacitation due to abnormal movements: None, normal Patient's awareness of abnormal movements (rate only patient's report): No Awareness, Dental Status Current  problems with teeth and/or dentures?: No Does patient usually wear dentures?: No   Musculoskeletal: Strength & Muscle Tone: within normal limits Gait & Station: normal Patient leans: N/A  Psychiatric Specialty Exam: Physical Exam  Nursing note and vitals reviewed. Neurological: She is alert.  Unstable gait and uses a walker at times  Psychiatric: Her affect is labile. Her speech is delayed. She is slowed and withdrawn. Thought content is paranoid and delusional. Cognition and memory are normal. She expresses impulsivity.    Review of Systems  Constitutional: Negative.   HENT: Negative.   Eyes: Negative.   Respiratory: Negative.   Cardiovascular: Negative.   Gastrointestinal: Negative.   Genitourinary: Negative.   Musculoskeletal: Negative.   Skin: Negative.   Neurological:       Unstable gait and uses a walker at times  Endo/Heme/Allergies: Negative.   Psychiatric/Behavioral:       The patient is having paranoid thoughts and is delusional.  All other systems reviewed and are negative.   Blood pressure 125/72, pulse 99, temperature 98.2 F (36.8 C), temperature source Oral, resp. rate 17, height 5\' 4"  (1.626 m), weight 108 kg (238 lb), SpO2 99 %.Body mass index is 40.85 kg/m.  General Appearance: Disheveled  Eye Contact:  Poor  Speech:  Blocked  Volume:  Normal  Mood:  Dysphoric  Affect:  Blunted  Thought Process:  Goal Directed and Descriptions of Associations: Intact  Orientation:  Full (Time, Place, and Person)  Thought Content:  Paranoid and delusional thoughts  Suicidal Thoughts:  No  Homicidal Thoughts:  No  Memory:  Immediate;   Fair Recent;   Fair Remote;   Fair  Judgement:  Poor  Insight:  Lacking  Psychomotor Activity:  Psychomotor Retardation  Concentration:  Concentration: Fair and Attention Span: Fair  Recall:  [employer] of Knowledge:  Fair  Language:  Fair  Akathisia:  No  Handed:  Right  AIMS (if indicated):     Assets:  Communication  Skills  Desire for Improvement Financial Resources/Insurance Housing Physical Health Resilience Social Support  ADL's:  Intact  Cognition:  WNL  Sleep:  Number of Hours: 7.5     Treatment Plan Summary:    Daily contact with patient to assess and evaluate symptoms and progress in treatment and Medication management    Ms. Bibb is a 67 year old female with a history of schizophrenia admitted for paranoia in the context of medication changes and treatment noncompliance.  #Psychosis, not improving -Cotninue Haldol now at10 mg nightly -continue Zoloft 50 mg daily -continue Amantadine 100 mg BID -she started on Depakote 500 mg TID. Will need to check VPA level- she has not been fully compliant with the Depakote  #Insomnia -continue Trazodone to 150 mg nightly  #Anxiety Vistaril is available  #HTN -continue Atenolol 100 mg, Lisinopril 20 mg and HCTZ 25 mg daily -continue ASA 81 mg daily  #DM -continue Metformin 500 mg BID - patient is on a carb modified diet - will monitor BS and place on SSI  #Hyperlipidemia -continue Crestor 40 mg daily  #Metabolic syndrome monitoring -Lipid panel and TSH are normal HgbA1C 6.2 -EKG, QTc 466  #Disposition -discharge to home with her son -follow up with [facility]        Chauncey Mann, MD 09/14/2017, 6:31 PM

## 2017-09-14 NOTE — Plan of Care (Signed)
Patient is alert to self and location. Patient denies SI, HI and AVH. Patient remains paranoid, refuses medications, very isolative in her room. Patient complains this morning about her stomach feeling upset. RN asked patient about her nutrition. Patient states she has not been eating. RN encourage patient to eat during meal times and snack times. Patient does not participate in groups. Patient can become irritable during education on medications and medication compliance. Vitals this morning were W.N.L. Nurse will continue to encourage nutrition, medication compliance and group attendance. Education: Knowledge of [place] Education information/materials will improve 09/14/2017 5956 - Not Progressing by Geraldo Docker, RN Emotional status will improve 09/14/2017 0955 - Not Progressing by Geraldo Docker, RN Mental status will improve 09/14/2017 0955 - Not Progressing by Geraldo Docker, RN Verbalization of understanding the information provided will improve 09/14/2017 0955 - Not Progressing by Geraldo Docker, RN   Activity: Sleeping patterns will improve 09/14/2017 0955 - Progressing by Geraldo Docker, RN

## 2017-09-14 NOTE — Progress Notes (Signed)
  D:Pt denies SI/HI/AVH. Pt is flat, minimally interactive, and isolates to her room. Pt has no complaints this evening regarding the psychiatric assessment, but presents with: agitations, paranioa, and manipulative behavior, about taking medications, specifically Depakote. Pt not eating and has very poor nutritional intake. Pt reports sleeping well. Pt verbally contracts for safety. Pt able to remain safe while on the unit.Pt. Sleeping good per sleeping reports and safety roundings.    A: Q x 15 minute observation checks were completed for safety. Patient was provided with education. Pt needs reinforcement on provided education. Patient was given scheduled medications, but refused her depakote. Pt. Continues to say that the depakote is, "no good for her" and "is poison". Pt when engaging with this Probation officer during med pass threw her Depakote on the floor and refused to take it. When attempting to ease patients concerns about not wanting to take her Depakote, the pt engages in manipulative behavior stating conditions that need to be met in order for her medications to be taken. When conditions are met the patient continues to present with no interest in taking prescribed Depakote. Will continue to encourage compliance. Will update treatment team of refusal and evident increase in paranioa. Patient was encourage to attend groups, participate in unit activities and continue with plan of care.   R:Patient is  not complaint with depakote and unit procedures. Pt given education on fall prevention and educated to call for help if she needs aid. Pt does not attend groups.  Pt refused morning depakote. Will update morning treatment team in report.             Patient slept for Estimated Hours of 6.45; Precautionary checks every 15 minutes for safety maintained, room free of safety hazards, patient sustains no injury or falls during this shift.

## 2017-09-15 ENCOUNTER — Inpatient Hospital Stay: Payer: Medicare Other

## 2017-09-15 LAB — COMPREHENSIVE METABOLIC PANEL
ALT: 24 U/L (ref 14–54)
AST: 33 U/L (ref 15–41)
Albumin: 4.3 g/dL (ref 3.5–5.0)
Alkaline Phosphatase: 67 U/L (ref 38–126)
Anion gap: 14 (ref 5–15)
BUN: 36 mg/dL — ABNORMAL HIGH (ref 6–20)
CO2: 24 mmol/L (ref 22–32)
Calcium: 9.7 mg/dL (ref 8.9–10.3)
Chloride: 101 mmol/L (ref 101–111)
Creatinine, Ser: 1.25 mg/dL — ABNORMAL HIGH (ref 0.44–1.00)
GFR calc Af Amer: 52 mL/min — ABNORMAL LOW (ref >60)
GFR calc non Af Amer: 44 mL/min — ABNORMAL LOW (ref >60)
Glucose, Bld: 141 mg/dL — ABNORMAL HIGH (ref 65–99)
Potassium: 2.9 mmol/L — ABNORMAL LOW (ref 3.5–5.1)
Sodium: 139 mmol/L (ref 135–145)
Total Bilirubin: 1.2 mg/dL (ref 0.3–1.2)
Total Protein: 7.7 g/dL (ref 6.5–8.1)

## 2017-09-15 LAB — CBC WITH DIFFERENTIAL/PLATELET
Basophils Absolute: 0.1 10*3/uL (ref 0–0.1)
Basophils Relative: 1 %
Eosinophils Absolute: 0.1 10*3/uL (ref 0–0.7)
Eosinophils Relative: 1 %
HCT: 42 % (ref 35.0–47.0)
Hemoglobin: 14 g/dL (ref 12.0–16.0)
Lymphocytes Relative: 36 %
Lymphs Abs: 1.8 10*3/uL (ref 1.0–3.6)
MCH: 31.2 pg (ref 26.0–34.0)
MCHC: 33.2 g/dL (ref 32.0–36.0)
MCV: 94 fL (ref 80.0–100.0)
Monocytes Absolute: 0.7 10*3/uL (ref 0.2–0.9)
Monocytes Relative: 14 %
Neutro Abs: 2.4 10*3/uL (ref 1.4–6.5)
Neutrophils Relative %: 48 %
Platelets: 233 10*3/uL (ref 150–440)
RBC: 4.47 MIL/uL (ref 3.80–5.20)
RDW: 14.8 % — ABNORMAL HIGH (ref 11.5–14.5)
WBC: 5 10*3/uL (ref 3.6–11.0)

## 2017-09-15 MED ORDER — HALOPERIDOL LACTATE 5 MG/ML IJ SOLN: 10.0000 mg | Freq: Every day | INTRAMUSCULAR | Status: DC

## 2017-09-15 MED ORDER — OLANZAPINE 10 MG IM SOLR
10.0000 mg | Freq: Every day | INTRAMUSCULAR | Status: DC
  Administered 2017-09-15 – 2017-09-16 (×2): 10 mg via INTRAMUSCULAR
  Filled 2017-09-15 (×2): qty 10

## 2017-09-15 MED ORDER — HALOPERIDOL 5 MG PO TABS: 10.0000 mg | ORAL_TABLET | Freq: Every day | ORAL | Status: DC

## 2017-09-15 MED ORDER — PROMETHAZINE HCL 25 MG PO TABS
25.0000 mg | ORAL_TABLET | Freq: Four times a day (QID) | ORAL | Status: DC | PRN
  Administered 2017-09-15: 25 mg via ORAL
  Filled 2017-09-15: qty 1

## 2017-09-15 MED ORDER — POTASSIUM CHLORIDE 20 MEQ PO PACK
40.0000 meq | PACK | Freq: Two times a day (BID) | ORAL | Status: AC
  Administered 2017-09-15 – 2017-09-16 (×2): 40 meq via ORAL
  Filled 2017-09-15 (×3): qty 2

## 2017-09-15 MED ORDER — OLANZAPINE 5 MG PO TBDP
20.0000 mg | ORAL_TABLET | Freq: Every day | ORAL | Status: DC
  Administered 2017-09-16 – 2017-09-17 (×2): 20 mg via ORAL
  Filled 2017-09-15 (×2): qty 4

## 2017-09-15 MED ORDER — INSULIN ASPART 100 UNIT/ML ~~LOC~~ SOLN
0.0000 [IU] | Freq: Three times a day (TID) | SUBCUTANEOUS | Status: DC
  Administered 2017-09-17 – 2017-09-19 (×7): 1 [IU] via SUBCUTANEOUS
  Filled 2017-09-15 (×2): qty 1

## 2017-09-15 MED ORDER — ONDANSETRON 4 MG PO TBDP: 4.0000 mg | ORAL_TABLET | Freq: Three times a day (TID) | ORAL | Status: DC | PRN

## 2017-09-15 MED ORDER — PROMETHAZINE HCL 25 MG/ML IJ SOLN
25.0000 mg | Freq: Four times a day (QID) | INTRAMUSCULAR | Status: DC | PRN
  Filled 2017-09-15: qty 1

## 2017-09-15 NOTE — Progress Notes (Signed)
D:Pt denies SI/HI/AVH. Pt is flat, minimally interactive, and isolates to her room. Pt has no complaints this evening regarding the psychiatric assessment, but continues to present with: agitations, paranioa, and manipulative behavior, about taking medications. Pt states, "is this water poisonous?". Pt not eating and has very poor nutritional intake do to paranioa. Pt reports sleeping well.Pt verbally contracts for safety. Pt able to remain safe while on the unit.Pt. Sleeping good per sleeping reports and safety roundings.    A: Q x 15 minute observation checks were completed for safety. Patient was provided with education. Pt needs reinforcement on provided education. Patient was given scheduled medications, but continues to be hesitant and paranoid about being poisoned as evidence by comments made about her drinking water and medications. Patient was encourage to attend groups, participate in unit activities and continue with plan of care.     R:Patient is complaint this evening with medication. Pt given education on fall prevention and educated to call for help if she needs aid.Pt given front wheel walker to aid in mobility as needed. Pt does not attend groups.   Patient slept for Estimated Hours of 7.30; Precautionary checks every 15 minutes for safety maintained, room free of safety hazards, patient sustains no injury or falls during this shift.

## 2017-09-15 NOTE — Progress Notes (Signed)
Patient ID: Lauren [name], female   DOB: December 22, 1950, 67 y.o.   MRN: [id number] Labs reviewed. Appears mildly dehydrated with labs and poor po intake. Her paranoia is playing a major role. KUB looks ok Try encourage oral fluids/ice chips. Replace K with kor con packet. If no improvement then will consider IVF

## 2017-09-15 NOTE — Progress Notes (Signed)
[facility] MD Progress Note  09/15/2017 1:05 PM Lauren [name]  MRN:  [id number]  Subjective:   [name] is very paranoid today. She did not accept her medication this morning even though they were open in her presence. Over the weekend, she has been refusing medications, food or drink. Today, she vomited twice when given liquids. She is still very paranoid.  Treatment plan. We will continue Haldol but give it I.M.. We ordered labs and KUB. We give Phenergan IM. Medicine consult was called. Medicine input is greatly appreciated.   Social/disposition. She will return home with her son. Follow up with Dr. Einar Grad at [facility].  Principal Problem: Schizophrenia, undifferentiated ([place]) Diagnosis:   Patient Active Problem List   Diagnosis Date Noted  . Schizophrenia, undifferentiated ([place]) [F20.3] 09/08/2017    Priority: High  . Screening for cervical cancer [Z12.4] 07/29/2017  . 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 Gravida 1 [IMO0002] 10/26/2015  . Dentagra [K08.89] 10/26/2015  . Itch of skin [L29.9] 10/26/2015  . Sex counseling [Z70.9] 10/26/2015  . Excessive sweating [R61] 07/05/2015  . Acid reflux [K21.9] 06/29/2015  . Encounter for pre-employment examination [Z02.1] 06/29/2015  . Diabetes mellitus type 2, controlled, without complications ([place]) [X91.4] 06/29/2015  . Cardiac murmur [R01.1] 06/29/2015  . Arthritis of knee, degenerative [M17.10] 06/28/2015  . Stiffness of both knees [M25.661, M25.662] 06/22/2015  . Insomnia w/ sleep apnea [G47.00, G47.30] 03/21/2015  . Anxiety disorder [F41.9] 03/21/2015  . Hypertension [I10] 03/21/2015  . HLD (hyperlipidemia) [E78.5] 03/21/2015  . Paranoid schizophrenia ([place]) [F20.0] 03/21/2015  . Urge incontinence [N39.41] 03/21/2015  . Breathlessness on exertion [R06.81] 02/02/2014  . Awareness of heartbeats [R00.2] 02/02/2014  . Apnea, sleep [G47.30] 02/02/2014   Total Time spent with patient: 20  minutes  Past Psychiatric History: schizophrenia  Past Medical History:  Past Medical History:  Diagnosis Date  . Anxiety   . Diabetes mellitus   . Fibromyalgia   . GERD (gastroesophageal reflux disease)   . Heart murmur   . Herniated disc   . Hyperlipidemia   . Hypertension   . Lack of bladder control   . Lung tumor   . Rheumatoid arthritis ([place])   . Schizoaffective disorder ([place])   . Seizure [facility])    after brain surgery 2012. last seizure 2013!  [name] Sleep apnea     Past Surgical History:  Procedure Laterality Date  . BRAIN TUMOR EXCISION  2012   benign  . CARDIAC CATHETERIZATION  2008  . CESAREAN SECTION    . CYST REMOVAL HAND    . LUNG LOBECTOMY  1977   benign tumor  . 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 Heart disease Brother   . Depression Mother   . Heart attack Mother   . Stroke Mother   . Alcohol abuse Father   . Stroke Father   . Diabetes Sister   . Diabetes Sister   . Stomach cancer Sister   . Kidney disease Sister   . COPD Brother   . Lung cancer Brother   . Diabetes Brother    Family Psychiatric  History: none reported Social History:  Social History   Substance and Sexual Activity  Alcohol Use No  . Alcohol/week: 0.0 oz     Social  History   Substance and Sexual Activity  Drug Use No    Social History   Socioeconomic History  . Marital status: Single    Spouse name: None  . Number of children: None  . Years of education: None  . Highest education level: None  Social Needs  . Financial resource strain: Not hard at all  . Food insecurity - worry: Never true  . Food insecurity - inability: Never true  . Transportation needs - medical: No  . Transportation needs - non-medical: No  Occupational History  . None  Tobacco Use  . Smoking  status: Never Smoker  . Smokeless tobacco: Never Used  Substance and Sexual Activity  . Alcohol use: No    Alcohol/week: 0.0 oz  . Drug use: No  . Sexual activity: No  Other Topics Concern  . None  Social History Narrative  . None   Additional Social History:                         Sleep: Fair  Appetite:  Poor  Current Medications: Current Facility-Administered Medications  Medication Dose Route Frequency Provider Last Rate Last Dose  . acetaminophen (TYLENOL) tablet 650 mg  650 mg Oral Q6H PRN [name], [name], MD      . alum & mag hydroxide-simeth (MAALOX/MYLANTA) 200-200-20 MG/5ML suspension 30 mL  30 mL Oral Q4H PRN [name], [name], MD   30 mL at 09/11/17 1952  . aspirin EC tablet 81 mg  81 mg Oral QAC breakfast [name], [name], MD   81 mg at 09/15/17 0937  . atenolol (TENORMIN) tablet 100 mg  100 mg Oral Daily [name], [name], MD   100 mg at 09/15/17 9024  . haloperidol (HALDOL) tablet 10 mg  10 mg Oral Q1200 Jadelyn Elks B, MD       Or  . haloperidol lactate (HALDOL) injection 10 mg  10 mg Intramuscular Q1200 Marlee Armenteros B, MD      . lisinopril (PRINIVIL,ZESTRIL) tablet 20 mg  20 mg Oral Daily [name], [name], MD   20 mg at 09/15/17 0973   And  . hydrochlorothiazide (HYDRODIURIL) tablet 25 mg  25 mg Oral Daily [name], [name], MD   25 mg at 09/15/17 5329  . hydrOXYzine (ATARAX/VISTARIL) tablet 25 mg  25 mg Oral TID PRN [name], [name], MD      . ibuprofen (ADVIL,MOTRIN) tablet 600 mg  600 mg Oral Q8H PRN [name], [name], MD      . LORazepam (ATIVAN) tablet 0.5 mg  0.5 mg Oral Q4H PRN [name], [name], MD      . magnesium hydroxide (MILK OF MAGNESIA) suspension 30 mL  30 mL Oral Daily PRN [name], [name], MD      . metFORMIN (GLUCOPHAGE) tablet 500 mg  500 mg Oral BID WC [name], [name], MD   500 mg at 09/15/17 0936  . promethazine (PHENERGAN) injection 25 mg  25 mg Intravenous Q6H PRN Bernarda Erck B, MD      . promethazine (PHENERGAN) tablet  25 mg  25 mg Oral Q6H PRN Tamari Redwine B, MD   25 mg at 09/15/17 1225  . rosuvastatin (CRESTOR) tablet 40 mg  40 mg Oral Daily [name], [name], MD   40 mg at 09/15/17 0936  . traZODone (DESYREL) tablet 100 mg  100 mg Oral QHS PRN [name], [name], MD        Lab Results:  No results found for this or any previous visit (from the past 48 hour(s)).  Blood Alcohol level:  Lab Results  Component Value Date   ETH <10 09/06/2017   ETH <10 76/16/0737    Metabolic Disorder Labs: Lab Results  Component Value Date   HGBA1C 6.2 (H) 09/09/2017   MPG 131.24 09/09/2017   No results found for: PROLACTIN Lab Results  Component Value Date   CHOL 211 (H) 09/09/2017   TRIG 102 09/09/2017   HDL 38 (L) 09/09/2017   CHOLHDL 5.6 09/09/2017   VLDL 20 09/09/2017   LDLCALC 153 (H) 09/09/2017   LDLCALC 102 (H) 12/20/2016    Physical Findings: AIMS: Facial and Oral Movements Muscles of Facial Expression: None, normal Lips and Perioral Area: None, normal Jaw: None, normal Tongue: None, normal,Extremity Movements Upper (arms, wrists, hands, fingers): None, normal Lower (legs, knees, ankles, toes): None, normal, Trunk Movements Neck, shoulders, hips: None, normal, Overall Severity Severity of abnormal movements (highest score from questions above): None, normal Incapacitation due to abnormal movements: None, normal Patient's awareness of abnormal movements (rate only patient's report): No Awareness, Dental Status Current problems with teeth and/or dentures?: No Does patient usually wear dentures?: No  CIWA:    COWS:     Musculoskeletal: Strength & Muscle Tone: within normal limits Gait & Station: normal Patient leans: N/A  Psychiatric Specialty Exam: Physical Exam  Nursing note and vitals reviewed. Psychiatric: Judgment normal. Her affect is blunt. Her speech is delayed. She is slowed and withdrawn. Thought content is paranoid. Cognition and memory are normal.    Review of Systems   Gastrointestinal: Positive for nausea and vomiting.  Neurological: Negative.   Psychiatric/Behavioral: Positive for hallucinations.  All other systems reviewed and are negative.   Blood pressure 123/70, pulse (!) 101, temperature 98.5 F (36.9 C), temperature source Oral, resp. rate 20, height 5\' 4"  (1.626 m), weight 108 kg (238 lb), SpO2 99 %.Body mass index is 40.85 kg/m.  General Appearance: Casual  Eye Contact:  Fair  Speech:  Clear and Coherent  Volume:  Normal  Mood:  Irritable  Affect:  Congruent  Thought Process:  Goal Directed and Descriptions of Associations: Intact  Orientation:  Full (Time, Place, and Person)  Thought Content:  Delusions and Paranoid Ideation  Suicidal Thoughts:  No  Homicidal Thoughts:  No  Memory:  Immediate;   Fair Recent;   Fair Remote;   Fair  Judgement:  Poor  Insight:  Lacking  Psychomotor Activity:  Psychomotor Retardation  Concentration:  Concentration: Fair and Attention Span: Fair  Recall:  [employer] of Knowledge:  Fair  Language:  Fair  Akathisia:  No  Handed:  Right  AIMS (if indicated):     Assets:  Communication Skills Desire for Improvement Financial Resources/Insurance Housing Resilience Social Support  ADL's:  Intact  Cognition:  WNL  Sleep:  Number of Hours: 7.5     Treatment Plan Summary: Daily contact with patient to assess and evaluate symptoms and progress in treatment and Medication management   [name] is a 67 year old female with a history of schizophrenia admitted for paranoia in the context of medication changes and treatment noncompliance.  #Psychosis, not improving -Cotninue Haldol 10 mg im daily -discontinue Zoloft and Amantadine -discontinue depakote as the patient refuses  #Poor oral intake and vomiting -medicine consult is appreciated -phenergan im as needed  #Insomnia -continue Trazodone to 150 mg nightly  #HTN -continue Atenolol 100 mg, Lisinopril 20 mg and HCTZ 25 mg  daily -continue ASA 81 mg daily  #DM -continue Metformin 500 mg BID - ADA diet, SSI, BS monitoring  #Hyperlipidemia -continue Crestor 40 mg daily  #Metabolic syndrome monitoring -Lipid panelandTSH are normalHgbA1C6.2 -EKG, QTc 466  #Disposition -discharge to home with her son -follow up with [facility]    Orson Slick, MD 09/15/2017, 1:05 PM

## 2017-09-15 NOTE — Progress Notes (Signed)
Patient ID: Lauren [name], female   DOB: [birth year], 67 y.o.   MRN: [id number]  Spoke with Dr. Posey Pronto. The patient is mildly dehydrated but will likelyneed medicine transfer tomorrow if she is not drinking. Patient refuses food, drink or medications most likely due to severe paranoia. I asked Dr. Weber Cooks for second opinion and would like to start forced medications.   She did not respond to Haldol like in the past. I will try Zyprexa. I am aware of her diabetes and obesity.

## 2017-09-15 NOTE — Plan of Care (Signed)
Pt. Verbally contracts for safety. Pt denies SI/HI. Pt able to remain safe while on the unit. Pt. Sleeping good per sleeping reports and safety roundings. Pt. Not progressing with patient education and needs reinforcement. Pt not eating. Nutritional intake very poor. Pt. Is very paranoid and not willing to eat. Pt compliant with medications this evening, but hesitant and presents with paranoia.

## 2017-09-15 NOTE — Progress Notes (Signed)
Patient ID: Lauren [name], female   DOB: 09/10/1950, 67 y.o.   MRN: [id number] Review of chart indicated the last BM was 09/13/17 but patient said it has been more than 6 days; concerns of SBO due to N&V, declined laxatives as offered, "I can't hold down any. Pleasant on approach; worried, sad, flat affect and mood; remains on falls precautions. Isolated to room except for when she needed hygiene products.

## 2017-09-15 NOTE — B[facility] Group Notes (Signed)
09/15/2017  Time: 1:00PM   Type of Therapy and Topic:  Group Therapy:  Overcoming Obstacles   Participation Level:  Did Not Attend   Description of Group:   In this group patients will be encouraged to explore what they see as obstacles to their own wellness and recovery. They will be guided to discuss their thoughts, feelings, and behaviors related to these obstacles. The group will process together ways to cope with barriers, with attention given to specific choices patients can make. Each patient will be challenged to identify changes they are motivated to make in order to overcome their obstacles. This group will be process-oriented, with patients participating in exploration of their own experiences, giving and receiving support, and processing challenge from other group members.   Therapeutic Goals: 1. Patient will identify personal and current obstacles as they relate to admission. 2. Patient will identify barriers that currently interfere with their wellness or overcoming obstacles.  3. Patient will identify feelings, thought process and behaviors related to these barriers. 4. Patient will identify two changes they are willing to make to overcome these obstacles:      Summary of Patient Progress  Pt was invited to attend group but chose not to attend. CSW will continue to encourage pt to attend group throughout their admission.     Therapeutic Modalities:   Cognitive Behavioral Therapy Solution Focused Therapy Motivational Interviewing Relapse Prevention Therapy  Alden Hipp, MSW, LCSW 09/15/2017 1:39 PM

## 2017-09-15 NOTE — Progress Notes (Signed)
Received Lauren [name] this am in her room, awake lying in bed. She attempted to talk to this writer with the bed sheet over her head. She refused her breakfast and lunch. She walked to the dining room with her walker, cleaned the top of the soda can and watched me pour the soda into the cup. She drink the soda and afterwards vomited the soda in the sink. She refused to take the medicine via IM. She returned to her room. Medicine arrived to assess her, labs were drawn and she went to xray. She is paranoid and stated she does not trust the staff. She denied all of the psychiatric symptoms, but unsure about hearing voices. She said her thoughts are not telling her to hurt herself and or not eat.  Dr. Mamie Nick was called, informed she is non compliant with the IM  Haldol and will follow -up with this Probation officer.

## 2017-09-15 NOTE — Tx Team (Signed)
Interdisciplinary Treatment and Diagnostic Plan Update  09/15/2017 Time of Session: 10:30am Lauren [name] MRN: [id number]  Principal Diagnosis: Schizophrenia, undifferentiated ([place])  Secondary Diagnoses: Principal Problem:   Schizophrenia, undifferentiated ([place]) Active Problems:   Hypertension   HLD (hyperlipidemia)   Acid reflux   Diabetes mellitus type 2, controlled, without complications (HCC)   Apnea, sleep   Current Medications:  Current Facility-Administered Medications  Medication Dose Route Frequency Provider Last Rate Last Dose  . acetaminophen (TYLENOL) tablet 650 mg  650 mg Oral Q6H PRN [name], [name], MD      . alum & mag hydroxide-simeth (MAALOX/MYLANTA) 200-200-20 MG/5ML suspension 30 mL  30 mL Oral Q4H PRN [name], [name], MD   30 mL at 09/11/17 1952  . aspirin EC tablet 81 mg  81 mg Oral QAC breakfast [name], [name], MD   81 mg at 09/12/17 0836  . haloperidol (HALDOL) tablet 10 mg  10 mg Oral Q1200 [name], [name], MD       Or  . haloperidol lactate (HALDOL) injection 10 mg  10 mg Intramuscular Q1200 [name], [name], MD      . hydrOXYzine (ATARAX/VISTARIL) tablet 25 mg  25 mg Oral TID PRN [name], [name], MD      . insulin aspart (novoLOG) injection 0-9 Units  0-9 Units Subcutaneous TID WC [name], [name], MD      . LORazepam (ATIVAN) tablet 0.5 mg  0.5 mg Oral Q4H PRN [name], [name], MD      . magnesium hydroxide (MILK OF MAGNESIA) suspension 30 mL  30 mL Oral Daily PRN [name], [name], MD      . promethazine (PHENERGAN) injection 25 mg  25 mg Intravenous Q6H PRN [name], [name], MD      . promethazine (PHENERGAN) tablet 25 mg  25 mg Oral Q6H PRN [name], [name], MD   25 mg at 09/15/17 1225  . rosuvastatin (CRESTOR) tablet 40 mg  40 mg Oral Daily [name], [name], MD   40 mg at 09/13/17 1823  . traZODone (DESYREL) tablet 100 mg  100 mg Oral QHS PRN [name], [name], MD       PTA Medications: Medications Prior to Admission   Medication Sig Dispense Refill Last Dose  . aspirin EC 81 MG tablet Take 81 mg by mouth daily before breakfast.    unk at unk  . atenolol (TENORMIN) 100 MG tablet TAKE 1 TABLET BY MOUTH  DAILY 90 tablet 0   . Bioflavonoid Products (VITAMIN C) CHEW Chew 1 tablet by mouth daily as needed (for immune system support).   prn  . Capsaicin (ZOSTRIX EX) Apply 1 application topically 4 (four) times daily as needed (for knee pain.).   prn  . haloperidol (HALDOL) 5 MG tablet Take 1 tablet (5 mg total) by mouth at bedtime. 30 tablet 0   . ibuprofen (ADVIL,MOTRIN) 200 MG tablet Take 600 mg by mouth every 8 (eight) hours as needed (for knee pain.).   prn  . lisinopril-hydrochlorothiazide (PRINZIDE,ZESTORETIC) 20-25 MG tablet TAKE 1 TABLET BY MOUTH  DAILY 90 tablet 0 unk at unk  . Lotion Base LOTN Apply 1 application topically 4 (four) times daily as needed (for knee pain.). [employer] Essential Lotion (Aloe Vera, Almond Oil, and 6 natural anti-inflammatory essential oils; [place], [place], [place], [place], Peppermint and [employer])   prn  . metFORMIN (GLUCOPHAGE) 500 MG tablet TAKE 1 TABLET BY MOUTH TWICE DAILY WITH A MEAL 60  tablet 0 unk at unk  . omeprazole (PRILOSEC) 20 MG capsule TAKE 1 CAPSULE BY MOUTH  DAILY 90 capsule 0 unk at unk  . rosuvastatin (CRESTOR) 40 MG tablet TAKE 1 TABLET BY MOUTH  DAILY 90 tablet 0 unk at unk  . sertraline (ZOLOFT) 50 MG tablet TAKE 1 TABLET BY MOUTH  DAILY 90 tablet 0   . traZODone (DESYREL) 50 MG tablet Take 1 tablet (50 mg total) at bedtime as needed by mouth for sleep. 30 tablet 1 prn    Patient Stressors: Health problems Medication change or noncompliance  Patient Strengths: Average or above average intelligence Communication skills Motivation for treatment/growth  Treatment Modalities: Medication Management, Group therapy, Case management,  1 to 1 session with clinician, Psychoeducation, Recreational therapy.   Physician Treatment  Plan for Primary Diagnosis: Schizophrenia, undifferentiated ([place]) Long Term Goal(s): Improvement in symptoms so as ready for discharge NA   Short Term Goals: Ability to identify changes in lifestyle to reduce recurrence of condition will improve Ability to verbalize feelings will improve Ability to disclose and discuss suicidal ideas Ability to demonstrate self-control will improve Ability to identify and develop effective coping behaviors will improve Ability to maintain clinical measurements within normal limits will improve Compliance with prescribed medications will improve Ability to identify triggers associated with substance abuse/mental health issues will improve NA  Medication Management: Evaluate patient's response, side effects, and tolerance of medication regimen.  Therapeutic Interventions: 1 to 1 sessions, Unit Group sessions and Medication administration.  Evaluation of Outcomes: Progressing  Physician Treatment Plan for Secondary Diagnosis: Principal Problem:   Schizophrenia, undifferentiated ([place]) Active Problems:   Hypertension   HLD (hyperlipidemia)   Acid reflux   Diabetes mellitus type 2, controlled, without complications ([facility])   Apnea, sleep  Long Term Goal(s): Improvement in symptoms so as ready for discharge NA   Short Term Goals: Ability to identify changes in lifestyle to reduce recurrence of condition will improve Ability to verbalize feelings will improve Ability to disclose and discuss suicidal ideas Ability to demonstrate self-control will improve Ability to identify and develop effective coping behaviors will improve Ability to maintain clinical measurements within normal limits will improve Compliance with prescribed medications will improve Ability to identify triggers associated with substance abuse/mental health issues will improve NA     Medication Management: Evaluate patient's response, side effects, and tolerance of medication  regimen.  Therapeutic Interventions: 1 to 1 sessions, Unit Group sessions and Medication administration.  Evaluation of Outcomes: Progressing   RN Treatment Plan for Primary Diagnosis: Schizophrenia, undifferentiated ([place]) Long Term Goal(s): Knowledge of disease and therapeutic regimen to maintain health will improve  Short Term Goals: Ability to identify and develop effective coping behaviors will improve and Compliance with prescribed medications will improve  Medication Management: RN will administer medications as ordered by provider, will assess and evaluate patient's response and provide education to patient for prescribed medication. RN will report any adverse and/or side effects to prescribing provider.  Therapeutic Interventions: 1 on 1 counseling sessions, Psychoeducation, Medication administration, Evaluate responses to treatment, Monitor vital signs and CBGs as ordered, Perform/monitor CIWA, COWS, AIMS and Fall Risk screenings as ordered, Perform wound care treatments as ordered.  Evaluation of Outcomes: Progressing   LCSW Treatment Plan for Primary Diagnosis: Schizophrenia, undifferentiated ([place]) Long Term Goal(s): Safe transition to appropriate next level of care at discharge, Engage patient in therapeutic group addressing interpersonal concerns.  Short Term Goals: Engage patient in aftercare planning with referrals and resources, Identify triggers associated  with mental health/substance abuse issues and Increase skills for wellness and recovery  Therapeutic Interventions: Assess for all discharge needs, 1 to 1 time with Social worker, Explore available resources and support systems, Assess for adequacy in community support network, Educate family and significant other(s) on suicide prevention, Complete Psychosocial Assessment, Interpersonal group therapy.  Evaluation of Outcomes: Progressing   Progress in Treatment: Attending groups: No Participating in groups:  No. Taking medication as prescribed: Yes. Toleration medication: Yes. Family/Significant other contact made: No, will contact:  when given permission Patient understands diagnosis: Yes. Discussing patient identified problems/goals with staff: Yes. Medical problems stabilized or resolved: No-Pt now vomiting and will be assessed by Medical consult Denies suicidal/homicidal ideation: Yes. Issues/concerns per patient self-inventory: No. Other:    New problem(s) identified: No, Describe:     New Short Term/Long Term Goal(s): Get medications stabilized, wants to be back on what she was on before meds changed.  Discharge Plan or Barriers: Discharge home with her son and follow up with Dr. Einar Grad at [place].  Reason for Continuation of Hospitalization: Delusions  Hallucinations Medication stabilization  Paranoia  Estimated Length of Stay: 3-5 days  Recreational Therapy: Patient Stressors: N/A Patient Goal: Patient will attend and participate in Recreation Therapy Group Sessions x 5 days.   Attendees: Patient:Lauren [name] 09/15/2017 3:18 PM  Physician: Orson Slick, MD 09/15/2017 3:18 PM  Nursing:  09/15/2017 3:18 PM  RN Care Manager: 09/15/2017 3:18 PM  Social Worker: Dossie Arbour, LCSW 09/15/2017 3:18 PM  Recreational Therapist: Roanna Epley, LRT 09/15/2017 3:18 PM  Other:  09/15/2017 3:18 PM  Other:  09/15/2017 3:18 PM  Other: 09/15/2017 3:18 PM    Scribe for Treatment Team: [name], LCSW 09/15/2017 3:18 PM

## 2017-09-15 NOTE — Progress Notes (Signed)
Lauren [name]  Received Zyprexa 10 mg IM in her left hip, she initially refused, but it was not necessary to restrain her. She is planning to talk with Dr. Mamie Nick in the am.  She was compliant with the potassium 40 meg per order in gingerale. She con sumed the entire can of ginger ale with the medication. She refused ice-chips.

## 2017-09-15 NOTE — Progress Notes (Signed)
Recreation Therapy Notes   Date: 01.07.2019  Time: 9:30 am  Location: Craft Room  Behavioral response: N/A  Intervention Topic: Problem Solving  Discussion/Intervention: Patient did not attend group.  Clinical Observations/Feedback:  Patient did not attend group.  Ralene Gasparyan LRT/CTRS          Adylee Leonardo 09/15/2017 11:28 AM

## 2017-09-15 NOTE — Consult Note (Signed)
[facility] [place] NAME: Lauren [name]    MR#:  [id number]  DATE OF BIRTH:  20-Jun-1951  DATE OF ADMISSION:  09/08/2017  PRIMARY CARE PHYSICIAN: Roselee Nova, MD   REQUESTING/REFERRING PHYSICIAN: dr Levell July  CHIEF COMPLAINT:  Nausea vomiting Patient has paranoid schizophrenia has not been eating well for past 1 week per nurse.  Patient gives very limited history and review of system.  She is not too keen on talking with me.  HISTORY OF PRESENT ILLNESS:  Lauren [name]  is a 67 y.o. female with a known history of paranoia, schizoaffective disorder who was admitted for increasing paranoia, disorganized thinking and deterioration of home functioning and agitation per family. Internal medicine was consulted since patient had vomiting yesterday and today.  She is paranoid thinking water is poisoned and food is not suitable for her and hence is not eating well.  She does not really know when she had a bowel movement. She had carbonated drink this morning and vomited thereafter.  She has stopped taking her oral meds since yesterday.  PAST MEDICAL HISTORY:   Past Medical History:  Diagnosis Date  . Anxiety   . Diabetes mellitus   . Fibromyalgia   . GERD (gastroesophageal reflux disease)   . Heart murmur   . Herniated disc   . Hyperlipidemia   . Hypertension   . Lack of bladder control   . Lung tumor   . Rheumatoid arthritis ([place])   . Schizoaffective disorder ([place])   . Seizure [facility])    after brain surgery 2012. last seizure 2013!  [name] Sleep apnea     PAST SURGICAL HISTOIRY:   Past Surgical History:  Procedure Laterality Date  . BRAIN TUMOR EXCISION  2012   benign  . CARDIAC CATHETERIZATION  2008  . CESAREAN SECTION    . CYST REMOVAL HAND    . LUNG LOBECTOMY  1977   benign tumor  . TOTAL KNEE ARTHROPLASTY Left 01/28/2017   Procedure: TOTAL KNEE ARTHROPLASTY;  Surgeon: Corky Mull, MD;  Location: [facility] ORS;   Service: Orthopedics;  Laterality: Left;    SOCIAL HISTORY:   Social History   Tobacco Use  . Smoking status: Never Smoker  . Smokeless tobacco: Never Used  Substance Use Topics  . Alcohol use: No    Alcohol/week: 0.0 oz    FAMILY HISTORY:   Family History  Problem Relation Age of Onset  . Heart disease Brother   . Depression Mother   . Heart attack Mother   . Stroke Mother   . Alcohol abuse Father   . Stroke Father   . Diabetes Sister   . Diabetes Sister   . Stomach cancer Sister   . Kidney disease Sister   . COPD Brother   . Lung cancer Brother   . Diabetes Brother     DRUG ALLERGIES:   Allergies  Allergen Reactions  . Percocet [Oxycodone-Acetaminophen] Diarrhea, Nausea And Vomiting and Nausea Only  . Tramadol Hcl Diarrhea, Nausea And Vomiting and Nausea Only  . Vicodin [Hydrocodone-Acetaminophen] Diarrhea, Nausea And Vomiting and Nausea Only    REVIEW OF SYSTEMS:   Unable to obtain review of systems secondary patient being paranoid.  She answers very limited questions MEDICATIONS AT HOME:   Prior to Admission medications   Medication Sig Start Date End Date Taking? Authorizing Provider  aspirin EC 81 MG tablet Take 81 mg by mouth daily before breakfast.  [provider]  atenolol (TENORMIN) 100 MG tablet TAKE 1 TABLET BY MOUTH  DAILY 08/28/17   Roselee Nova, MD  Bioflavonoid Products (VITAMIN C) CHEW Chew 1 tablet by mouth daily as needed (for immune system support).    [provider]  Capsaicin (ZOSTRIX EX) Apply 1 application topically 4 (four) times daily as needed (for knee pain.).    [provider]  haloperidol (HALDOL) 5 MG tablet Take 1 tablet (5 mg total) by mouth at bedtime. 09/05/17   Ursula Alert, MD  ibuprofen (ADVIL,MOTRIN) 200 MG tablet Take 600 mg by mouth every 8 (eight) hours as needed (for knee pain.).    [provider]  lisinopril-hydrochlorothiazide (PRINZIDE,ZESTORETIC) 20-25 MG tablet  TAKE 1 TABLET BY MOUTH  DAILY 07/08/17   Roselee Nova, MD  [facility] LOTN Apply 1 application topically 4 (four) times daily as needed (for knee pain.). [employer] Essential Lotion (Aloe Vera, [employer], and 6 natural anti-inflammatory essential oils; [place], [place], [place], [place], Peppermint [employer])    [provider]  metFORMIN (GLUCOPHAGE) 500 MG tablet TAKE 1 TABLET BY MOUTH TWICE DAILY WITH A MEAL 01/27/17   Roselee Nova, MD  omeprazole (PRILOSEC) 20 MG capsule TAKE 1 CAPSULE BY MOUTH  DAILY 07/08/17   Rochel Brome A, MD  rosuvastatin (CRESTOR) 40 MG tablet TAKE 1 TABLET BY MOUTH  DAILY 07/08/17   Roselee Nova, MD  sertraline (ZOLOFT) 50 MG tablet TAKE 1 TABLET BY MOUTH  DAILY 09/05/17   Ursula Alert, MD  traZODone (DESYREL) 50 MG tablet Take 1 tablet (50 mg total) at bedtime as needed by mouth for sleep. 07/23/17   Ursula Alert, MD      VITAL SIGNS:  Blood pressure 123/70, pulse (!) 101, temperature 98.5 F (36.9 C), temperature source Oral, resp. rate 20, height 5\' 4"  (1.626 m), weight 108 kg (238 lb), SpO2 99 %.  PHYSICAL EXAMINATION:  GENERAL:  67 y.o.-year-old patient lying in the bed with no acute distress.  Orbit obesity eYES: Pupils equal, round, reactive to light and accommodation. No scleral icterus. Extraocular muscles intact.  HEENT: Head atraumatic, normocephalic. Oropharynx and nasopharynx clear.  NECK:  Supple, no jugular venous distention. No thyroid enlargement, no tenderness.  LUNGS: Normal breath sounds bilaterally, no wheezing, rales,rhonchi or crepitation. No use of accessory muscles of respiration.  CARDIOVASCULAR: S1, S2 normal. No murmurs, rubs, or gallops.  ABDOMEN: Soft, nontender, nondistended. Few Bowel sounds present. No organomegaly or mass.  Midline surgical abdominal scar from C-section present EXTREMITIES: No pedal edema, cyanosis, or clubbing.  NEUROLOGIC: Cranial nerves II through XII  are intact. Muscle strength 5/5 in all extremities. Sensation intact. Gait not checked.  PSYCHIATRIC: The patient is alert and oriented x 3.  SKIN: No obvious rash, lesion, or ulcer.   LABORATORY PANEL:   CBC No results for input(s): WBC, HGB, HCT, PLT in the last 168 hours. ------------------------------------------------------------------------------------------------------------------  Chemistries  No results for input(s): NA, K, CL, CO2, GLUCOSE, BUN, CREATININE, CALCIUM, MG, AST, ALT, ALKPHOS, BILITOT in the last 168 hours.  Invalid input(s): GFRCGP ------------------------------------------------------------------------------------------------------------------  Cardiac Enzymes No results for input(s): TROPONINI in the last 168 hours. ------------------------------------------------------------------------------------------------------------------  RADIOLOGY:  No results found.  EKG:   Orders placed or performed during the hospital encounter of 08/16/17  . ED EKG  . ED EKG  . EKG 12-Lead  . EKG 12-Lead  . EKG    IMPRESSION AND PLAN:  Vonzella Althaus  is a 67 y.o. female with a known history of paranoia, schizoaffective disorder who was admitted for increasing paranoia, disorganized thinking and deterioration of home functioning and agitation per family. Internal medicine was consulted since patient had vomiting yesterday and today.  1.  Nausea vomiting and poor p.o. Intake -?  Question gastroparesis history of diabetes versus ileus -Await for x-ray KUB, CBC, conference of metabolic panel -recommend ice chips and clear liquid diet.  However patient is very poor annoyed about medications and food she thinks there is poison in it. -PRN Phenergan IV--- (patient declined to take any form of medication)  2.  Paranoid schizophrenia -per Psychiatry -Patient has an moderate commitment  3.  Type 2 diabetes -Sugars are stable.  I will hold off metformin since patient is  not eating  4.  Hypertension -Blood pressure is stable and bit on the softer side -Hold off on metoprolol, lisinopril and hydrochlorothiazide    All the records are reviewed and case discussed with Consulting provider. Management plans discussed with the patient, family and they are in agreement.  CODE STATUS: full  TOTAL TIME TAKING CARE OF THIS PATIENT: *40* minutes.    Fritzi Mandes M.D on 09/15/2017 at 1:50 PM  Between 7am to 6pm - Pager - [phone]  After 6pm go to [email or web address] - password [email or web address] [facility] [facility]  Office  [phone]  CC: Primary care Physician: Roselee Nova, MD

## 2017-09-16 LAB — BASIC METABOLIC PANEL
Anion gap: 11 (ref 5–15)
BUN: 33 mg/dL — ABNORMAL HIGH (ref 6–20)
CO2: 28 mmol/L (ref 22–32)
Calcium: 9.8 mg/dL (ref 8.9–10.3)
Chloride: 103 mmol/L (ref 101–111)
Creatinine, Ser: 1.24 mg/dL — ABNORMAL HIGH (ref 0.44–1.00)
GFR calc Af Amer: 52 mL/min — ABNORMAL LOW (ref >60)
GFR calc non Af Amer: 45 mL/min — ABNORMAL LOW (ref >60)
Glucose, Bld: 113 mg/dL — ABNORMAL HIGH (ref 65–99)
Potassium: 3.4 mmol/L — ABNORMAL LOW (ref 3.5–5.1)
Sodium: 142 mmol/L (ref 135–145)

## 2017-09-16 LAB — MAGNESIUM: Magnesium: 2.3 mg/dL (ref 1.7–2.4)

## 2017-09-16 LAB — GLUCOSE, CAPILLARY
Glucose-Capillary: 117 mg/dL — ABNORMAL HIGH (ref 65–99)
Glucose-Capillary: 136 mg/dL — ABNORMAL HIGH (ref 65–99)

## 2017-09-16 MED ORDER — GLUCERNA SHAKE PO LIQD
237.0000 mL | Freq: Three times a day (TID) | ORAL | Status: DC
  Administered 2017-09-16 – 2017-09-19 (×7): 237 mL via ORAL

## 2017-09-16 MED ORDER — HALOPERIDOL DECANOATE 100 MG/ML IM SOLN
50.0000 mg | INTRAMUSCULAR | Status: DC
  Administered 2017-09-16: 50 mg via INTRAMUSCULAR
  Filled 2017-09-16: qty 0.5

## 2017-09-16 NOTE — [facility] Group Notes (Signed)
09/16/2017 1PM  Type of Therapy/Topic:  Group Therapy:  Feelings about Diagnosis  Participation Level:  Active   Description of Group:   This group will allow patients to explore their thoughts and feelings about diagnoses they have received. Patients will be guided to explore their level of understanding and acceptance of these diagnoses. Facilitator will encourage patients to process their thoughts and feelings about the reactions of others to their diagnosis and will guide patients in identifying ways to discuss their diagnosis with significant others in their lives. This group will be process-oriented, with patients participating in exploration of their own experiences, giving and receiving support, and processing challenge from other group members.   Therapeutic Goals: 1. Patient will demonstrate understanding of diagnosis as evidenced by identifying two or more symptoms of the disorder 2. Patient will be able to express two feelings regarding the diagnosis 3. Patient will demonstrate their ability to communicate their needs through discussion and/or role play  Summary of Patient Progress:  Actively and appropriately engaged in the group. Patient was able to provide support and validation to other group members.Patient practiced active listening when interacting with the facilitator and other group members Patient in still in the process of obtaining treatment goals. Lauren [name] spoke about her issues with taking in food and drinking. She states "today is the first day that I didn't get sick after eating and came to group."     Therapeutic Modalities:   Cognitive Behavioral Therapy Brief Therapy Feelings Identification    Darin Engels, LCSW 09/16/2017 1:53 PM

## 2017-09-16 NOTE — Plan of Care (Signed)
Patient is alert and oriented today. Patient denies SI, HI and AVH. Blood Pressure this morning 171/92. Patient able to drink Gatorade, and ate a bananas and some fruit without vomiting for breakfast. Patient compliant with morning medications and able to state uses for each medication. Patient attended group today able to express feelings and concerns. Nurse will continue to monitor patient and encourage eating and group participation. Safety checks will continue Q 15 minutes. Education: Knowledge of [place] Education information/materials will improve 09/16/2017 1116 - Progressing by Geraldo Docker, RN Emotional status will improve 09/16/2017 1116 - Progressing by Geraldo Docker, RN Mental status will improve 09/16/2017 1116 - Progressing by Geraldo Docker, RN Verbalization of understanding the information provided will improve 09/16/2017 1116 - Progressing by Geraldo Docker, RN   Activity: Sleeping patterns will improve 09/16/2017 1116 - Progressing by Geraldo Docker, RN

## 2017-09-16 NOTE — Progress Notes (Signed)
[facility] at [place] NAME: Lauren [name]    MR#:  [id number]  DATE OF BIRTH:  1950/09/11  SUBJECTIVE:  CHIEF COMPLAINT:  No chief complaint on file.  The patient thinks there is poison in the food and does not want to eat. Per RN, the patient has better oral intake. REVIEW OF SYSTEMS:  Review of Systems  Constitutional: Positive for malaise/fatigue. Negative for chills and fever.  HENT: Negative for sore throat.   Eyes: Negative for blurred vision and double vision.  Respiratory: Negative for cough, hemoptysis, shortness of breath, wheezing and stridor.   Cardiovascular: Negative for chest pain, palpitations, orthopnea and leg swelling.  Gastrointestinal: Positive for nausea. Negative for abdominal pain, blood in stool, diarrhea, melena and vomiting.  Genitourinary: Negative for dysuria, flank pain and hematuria.  Musculoskeletal: Negative for back pain and joint pain.  Neurological: Negative for dizziness, sensory change, focal weakness, seizures, loss of consciousness, weakness and headaches.  Endo/Heme/Allergies: Negative for polydipsia.  Psychiatric/Behavioral: Negative for depression. The patient is not nervous/anxious.     DRUG ALLERGIES:   Allergies  Allergen Reactions  . Percocet [Oxycodone-Acetaminophen] Diarrhea, Nausea And Vomiting and Nausea Only  . Tramadol Hcl Diarrhea, Nausea And Vomiting and Nausea Only  . Vicodin [Hydrocodone-Acetaminophen] Diarrhea, Nausea And Vomiting and Nausea Only   VITALS:  Blood pressure (!) 172/91, pulse 92, temperature 98.1 F (36.7 C), temperature source Oral, resp. rate 20, height 5\' 4"  (1.626 m), weight 238 lb (108 kg), SpO2 99 %. PHYSICAL EXAMINATION:  Physical Exam  Constitutional: She is oriented to person, place, and time and well-developed, well-nourished, and in no distress.  Obesity.  HENT:  Head: Normocephalic.  Mouth/Throat: Oropharynx is clear and moist.  Eyes:  Conjunctivae and EOM are normal. Pupils are equal, round, and reactive to light. No scleral icterus.  Neck: Normal range of motion. Neck supple. No JVD present. No tracheal deviation present.  Cardiovascular: Normal rate, regular rhythm and normal heart sounds. Exam reveals no gallop.  No murmur heard. Pulmonary/Chest: Effort normal and breath sounds normal. No respiratory distress. She has no wheezes. She has no rales.  Abdominal: Soft. Bowel sounds are normal. She exhibits no distension. There is no tenderness. There is no rebound.  Musculoskeletal: Normal range of motion. She exhibits no edema or tenderness.  Neurological: She is alert and oriented to person, place, and time. No cranial nerve deficit.  Skin: No rash noted. No erythema.   LABORATORY PANEL:  Female CBC Recent Labs  Lab 09/15/17 1341  WBC 5.0  HGB 14.0  HCT 42.0  PLT 233   ------------------------------------------------------------------------------------------------------------------ Chemistries  Recent Labs  Lab 09/15/17 1341 09/16/17 0643  NA 139 142  K 2.9* 3.4*  CL 101 103  CO2 24 28  GLUCOSE 141* 113*  BUN 36* 33*  CREATININE 1.25* 1.24*  CALCIUM 9.7 9.8  MG  --  2.3  AST 33  --   ALT 24  --   ALKPHOS 67  --   BILITOT 1.2  --    RADIOLOGY:  No results found. ASSESSMENT AND PLAN:   Lauren [name]  is a 67 y.o. female with a known history of paranoia, schizoaffective disorder who was admitted for increasing paranoia, disorganized thinking and deterioration of home functioning and agitation per family. Internal medicine was consulted since patient had vomiting yesterday and today.  1.  Nausea vomiting and poor p.o. Intake Unremarkable x-ray KUB. -recommend ice chips and clear liquid diet.  However  patient is very poor annoyed about medications and food she thinks there is poison in it. -PRN Phenergan IV--- (patient declined to take any form of medication) Better oral intake.  Hypokalemia.   Improving with potassium supplement. Dehydration.  Better.  Encourage oral intake.  2.  Paranoid schizophrenia -per Psychiatry -Patient has an moderate commitment  3.  Type 2 diabetes Hold metformin since patient is not eating  4.  Hypertension  Resume metoprolol due to elevated blood pressure, but hold lisinopril and hydrochlorothiazide due to dehydration.  Encourage oral intake. Medically stable.  Sign off. All the records are reviewed and case discussed with Care Management/Social Worker. Management plans discussed with the patient, family and they are in agreement.  CODE STATUS: Full Code  TOTAL TIME TAKING CARE OF THIS PATIENT: 27 minutes.   More than 50% of the time was spent in counseling/coordination of care: YES  POSSIBLE D/C IN ? DAYS, DEPENDING ON CLINICAL CONDITION.   Demetrios Loll M.D on 09/16/2017 at 3:17 PM  Between 7am to 6pm - Pager - [phone]  After 6pm go to [email or web address] - [place] [facility]

## 2017-09-16 NOTE — Progress Notes (Signed)
D: Patient denies SI/HI/AVH. Patient isolates to room and is paranoid. Patient states "I ain't doing that, it is poison" when offering to complete CBG. Patient is calm and pleasant.   A: Patient was assessed by this nurse. Q x 15 minute observation checks were completed for safety. Patient was provided with verbal education on provided medications. Patient care plan was reviewed. Patient was offered support and encouragement. Patient was encourage to attend groups, participate in unit activities and continue with plan of care.   R: Patient refused CBG. Patient has no complaints of pain at this time. Patient is receptive to treatment and safety maintained on unit.

## 2017-09-16 NOTE — Progress Notes (Signed)
[facility] MD Progress Note  09/16/2017 2:50 PM Lauren [name]  MRN:  [id number]  Subjective:   Lauren [name] refused food, drink or medications yesterday and was started on forced medications. She received injection of 10 mg of Zyprexa yesterday. She accepted ginger ale, ate some fruit for breakfast and some lunch. She is out of bed and talking to our staff. She is still suspicious and worries that her sister is in danger but agreed to take Haldol decanoate injection today. She complains of bad knee, leg weakness and difficulties walking. Denies falling at home. Agrees to work with PT.  We have not been able to get in touch with her son as of yet. We will try to call her sister even though the patient is reluctant to give [place] permission. She is too psychotic to participate in discharge planning.  Treatment plan. We give Haldol decanoate 50 mg injection today. We continue Zyprexa 20 mg for psychosis.  Social/disposition. She will return home with family. Follow up with [facility].  Principal Problem: Schizophrenia, undifferentiated (Lauren [name]) Diagnosis:   Patient Active Problem List   Diagnosis Date Noted  . Schizophrenia, undifferentiated (Lauren [name]) [F20.3] 09/08/2017    Priority: High  . Screening for cervical cancer [Z12.4] 07/29/2017  . 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 Gravida 1 [IMO0002] 10/26/2015  . Dentagra [K08.89] 10/26/2015  . Itch of skin [L29.9] 10/26/2015  . Sex counseling [Z70.9] 10/26/2015  . Excessive sweating [R61] 07/05/2015  . Acid reflux [K21.9] 06/29/2015  . Encounter for pre-employment examination [Z02.1] 06/29/2015  . Diabetes mellitus type 2, controlled, without complications (Lauren [name]) [L79.8] 06/29/2015  . Cardiac murmur [R01.1] 06/29/2015  . Arthritis of knee, degenerative [M17.10] 06/28/2015  . Stiffness of both knees [M25.661, M25.662] 06/22/2015  . Insomnia w/ sleep apnea [G47.00, G47.30] 03/21/2015  . Anxiety disorder [F41.9]  03/21/2015  . Hypertension [I10] 03/21/2015  . HLD (hyperlipidemia) [E78.5] 03/21/2015  . Paranoid schizophrenia (Lauren [name]) [F20.0] 03/21/2015  . Urge incontinence [N39.41] 03/21/2015  . Breathlessness on exertion [R06.81] 02/02/2014  . Awareness of heartbeats [R00.2] 02/02/2014  . Apnea, sleep [G47.30] 02/02/2014   Total Time spent with patient: 20 minutes  Past Psychiatric History: schizophrenia  Past Medical History:  Past Medical History:  Diagnosis Date  . Anxiety   . Diabetes mellitus   . Fibromyalgia   . GERD (gastroesophageal reflux disease)   . Heart murmur   . Herniated disc   . Hyperlipidemia   . Hypertension   . Lack of bladder control   . Lung tumor   . Rheumatoid arthritis (Lauren [name])   . Schizoaffective disorder (Lauren [name])   . Seizure [facility])    after brain surgery 2012. last seizure 2013!  [name] Sleep apnea     Past Surgical History:  Procedure Laterality Date  . BRAIN TUMOR EXCISION  2012   benign  . CARDIAC CATHETERIZATION  2008  . CESAREAN SECTION    . CYST REMOVAL HAND    . LUNG LOBECTOMY  1977   benign tumor  . TOTAL KNEE ARTHROPLASTY Left 01/28/2017   Procedure: TOTAL KNEE ARTHROPLASTY;  Surgeon: Corky Mull, MD;  Location: [name];  Service: Orthopedics;  Laterality: Left;   Family History:  Family History  Problem Relation Age of Onset  . Heart disease Brother   . Depression Mother   . Heart attack Mother   . Stroke Mother   . Alcohol abuse Father   . Stroke Father   . Diabetes Sister   .  Diabetes Sister   . Stomach cancer Sister   . Kidney disease Sister   . COPD Brother   . Lung cancer Brother   . Diabetes Brother    Family Psychiatric  History: son with mental illness Social History:  Social History   Substance and Sexual Activity  Alcohol Use No  . Alcohol/week: 0.0 oz     Social History   Substance and Sexual Activity  Drug Use No    Social History   Socioeconomic History  . Marital status: Single    Spouse name: None  . Number  of children: None  . Years of education: None  . Highest education level: None  Social Needs  . Financial resource strain: Not hard at all  . Food insecurity - worry: Never true  . Food insecurity - inability: Never true  . Transportation needs - medical: No  . Transportation needs - non-medical: No  Occupational History  . None  Tobacco Use  . Smoking status: Never Smoker  . Smokeless tobacco: Never Used  Substance and Sexual Activity  . Alcohol use: No    Alcohol/week: 0.0 oz  . Drug use: No  . Sexual activity: No  Other Topics Concern  . None  Social History Narrative  . None   Additional Social History:                         Sleep: Fair  Appetite:  Fair  Current Medications: Current Facility-Administered Medications  Medication Dose Route Frequency Provider Last Rate Last Dose  . acetaminophen (TYLENOL) tablet 650 mg  650 mg Oral Q6H PRN [name], [name], MD      . alum & mag hydroxide-simeth (MAALOX/MYLANTA) 200-200-20 MG/5ML suspension 30 mL  30 mL Oral Q4H PRN [name], [name], MD   30 mL at 09/11/17 1952  . aspirin EC tablet 81 mg  81 mg Oral QAC breakfast [name], [name], MD   81 mg at 09/16/17 0854  . feeding supplement (GLUCERNA SHAKE) (GLUCERNA SHAKE) liquid 237 mL  237 mL Oral TID BM ,  B, MD   237 mL at 09/16/17 1227  . haloperidol decanoate (HALDOL DECANOATE) 100 MG/ML injection 50 mg  50 mg Intramuscular Q28 days ,  B, MD      . hydrOXYzine (ATARAX/VISTARIL) tablet 25 mg  25 mg Oral TID PRN [name], [name], MD      . insulin aspart (novoLOG) injection 0-9 Units  0-9 Units Subcutaneous TID WC ,  B, MD      . LORazepam (ATIVAN) tablet 0.5 mg  0.5 mg Oral Q4H PRN [name], [name], MD      . magnesium hydroxide (MILK OF MAGNESIA) suspension 30 mL  30 mL Oral Daily PRN [name], [name], MD      . OLANZapine (ZYPREXA) injection 10 mg  10 mg Intramuscular Q1200 ,  B, MD   10 mg at 09/16/17  1231  . OLANZapine zydis (ZYPREXA) disintegrating tablet 20 mg  20 mg Oral Daily ,  B, MD   20 mg at 09/16/17 0854  . ondansetron (ZOFRAN-ODT) disintegrating tablet 4 mg  4 mg Oral Q8H PRN Fritzi Mandes, MD      . potassium chloride (KLOR-CON) packet 40 mEq  40 mEq Oral BID Fritzi Mandes, MD   40 mEq at 09/15/17 1730  . rosuvastatin (CRESTOR) tablet 40 mg  40 mg Oral Daily [name], [name], MD   40 mg at 09/16/17  [place].Companion  . traZODone (DESYREL) tablet 100 mg  100 mg Oral QHS PRN [name], [name], MD        Lab Results:  Results for orders placed or performed during the [name] encounter of 09/08/17 (from the past 48 hour(s))  CBC with Differential/Platelet     Status: Abnormal   Collection Time: 09/15/17  1:41 PM  Result Value Ref Range   WBC 5.0 3.6 - 11.0 K/uL   RBC 4.47 3.80 - 5.20 MIL/uL   Hemoglobin 14.0 12.0 - 16.0 g/dL   HCT 42.0 35.0 - 47.0 %   MCV 94.0 80.0 - 100.0 fL   MCH 31.2 26.0 - 34.0 pg   MCHC 33.2 32.0 - 36.0 g/dL   RDW 14.8 (H) 11.5 - 14.5 %   Platelets 233 150 - 440 K/uL   Neutrophils Relative % 48 %   Neutro Abs 2.4 1.4 - 6.5 K/uL   Lymphocytes Relative 36 %   Lymphs Abs 1.8 1.0 - 3.6 K/uL   Monocytes Relative 14 %   Monocytes Absolute 0.7 0.2 - 0.9 K/uL   Eosinophils Relative 1 %   Eosinophils Absolute 0.1 0 - 0.7 K/uL   Basophils Relative 1 %   Basophils Absolute 0.1 0 - 0.1 K/uL    Comment: Performed at [name], [address] [place], [place] [address]  Comprehensive metabolic panel     Status: Abnormal   Collection Time: 09/15/17  1:41 PM  Result Value Ref Range   Sodium 139 135 - 145 mmol/L   Potassium 2.9 (L) 3.5 - 5.1 mmol/L   Chloride 101 101 - 111 mmol/L   CO2 24 22 - 32 mmol/L   Glucose, Bld 141 (H) 65 - 99 mg/dL   BUN 36 (H) 6 - 20 mg/dL   Creatinine, Ser 1.25 (H) 0.44 - 1.00 mg/dL   Calcium 9.7 8.9 - 10.3 mg/dL   Total Protein 7.7 6.5 - 8.1 g/dL   Albumin 4.3 3.5 - 5.0 g/dL   AST 33 15 - 41 U/L   ALT 24 14 - 54  U/L   Alkaline Phosphatase 67 38 - 126 U/L   Total Bilirubin 1.2 0.3 - 1.2 mg/dL   GFR calc non Af Amer 44 (L) >60 mL/min   GFR calc Af Amer 52 (L) >60 mL/min    Comment: (NOTE) The eGFR has been calculated using the CKD EPI equation. This calculation has not been validated in all clinical situations. eGFR's persistently <60 mL/min signify possible Chronic Kidney Disease.    Anion gap 14 5 - 15    Comment: Performed at [facility], [name] [place], [place] [address]  Basic metabolic panel     Status: Abnormal   Collection Time: 09/16/17  6:43 AM  Result Value Ref Range   Sodium 142 135 - 145 mmol/L   Potassium 3.4 (L) 3.5 - 5.1 mmol/L   Chloride 103 101 - 111 mmol/L   CO2 28 22 - 32 mmol/L   Glucose, Bld 113 (H) 65 - 99 mg/dL   BUN 33 (H) 6 - 20 mg/dL   Creatinine, Ser 1.24 (H) 0.44 - 1.00 mg/dL   Calcium 9.8 8.9 - 10.3 mg/dL   GFR calc non Af Amer 45 (L) >60 mL/min   GFR calc Af Amer 52 (L) >60 mL/min    Comment: (NOTE) The eGFR has been calculated using the CKD EPI equation. This calculation has not been validated in all clinical situations. eGFR's persistently <60  mL/min signify possible Chronic Kidney Disease.    Anion gap 11 5 - 15    Comment: Performed at [name], [address] [place], [place] [address]  Magnesium     Status: None   Collection Time: 09/16/17  6:43 AM  Result Value Ref Range   Magnesium 2.3 1.7 - 2.4 mg/dL    Comment: Performed at [name], [address] [place], [place] [address]  Glucose, capillary     Status: Abnormal   Collection Time: 09/16/17 11:41 AM  Result Value Ref Range   Glucose-Capillary 117 (H) 65 - 99 mg/dL   Comment 1 Notify RN     Blood Alcohol level:  Lab Results  Component Value Date   ETH <10 09/06/2017   ETH <10 94/76/5465    Metabolic Disorder Labs: Lab Results  Component Value Date   HGBA1C 6.2 (H) 09/09/2017   MPG 131.24 09/09/2017   No results found for:  PROLACTIN Lab Results  Component Value Date   CHOL 211 (H) 09/09/2017   TRIG 102 09/09/2017   HDL 38 (L) 09/09/2017   CHOLHDL 5.6 09/09/2017   VLDL 20 09/09/2017   LDLCALC 153 (H) 09/09/2017   LDLCALC 102 (H) 12/20/2016    Physical Findings: AIMS: Facial and Oral Movements Muscles of Facial Expression: None, normal Lips and Perioral Area: None, normal Jaw: None, normal Tongue: None, normal,Extremity Movements Upper (arms, wrists, hands, fingers): None, normal Lower (legs, knees, ankles, toes): None, normal, Trunk Movements Neck, shoulders, hips: None, normal, Overall Severity Severity of abnormal movements (highest score from questions above): None, normal Incapacitation due to abnormal movements: None, normal Patient's awareness of abnormal movements (rate only patient's report): No Awareness, Dental Status Current problems with teeth and/or dentures?: No Does patient usually wear dentures?: No  CIWA:    COWS:     Musculoskeletal: Strength & Muscle Tone: within normal limits Gait & Station: normal Patient leans: N/A  Psychiatric Specialty Exam: Physical Exam  Nursing note and vitals reviewed. Psychiatric: Her speech is normal. Her mood appears anxious. She is slowed. Thought content is paranoid and delusional. Cognition and memory are normal. She expresses inappropriate judgment.    Review of Systems  Neurological: Positive for weakness.  Psychiatric/Behavioral: Positive for hallucinations.  All other systems reviewed and are negative.   Blood pressure (!) 172/91, pulse 92, temperature 98.1 F (36.7 C), temperature source Oral, resp. rate 20, height 5' 4" (1.626 m), weight 108 kg (238 lb), SpO2 99 %.Body mass index is 40.85 kg/m.  General Appearance: Disheveled  Eye Contact:  Fair  Speech:  Clear and Coherent  Volume:  Normal  Mood:  Dysphoric and Irritable  Affect:  Congruent  Thought Process:  Disorganized and Descriptions of Associations: Tangential   Orientation:  Full (Time, Place, and Person)  Thought Content:  Delusions and Paranoid Ideation  Suicidal Thoughts:  No  Homicidal Thoughts:  No  Memory:  Immediate;   Fair Recent;   Fair Remote;   Fair  Judgement:  Poor  Insight:  Shallow  Psychomotor Activity:  Psychomotor Retardation  Concentration:  Concentration: Fair and Attention Span: Fair  Recall:  [employer] of Knowledge:  Fair  Language:  Fair  Akathisia:  No  Handed:  Right  AIMS (if indicated):     Assets:  Communication Skills Desire for Improvement Financial Resources/Insurance Housing Resilience Social Support  ADL's:  Intact  Cognition:  WNL  Sleep:  Number of Hours: 6.5     Treatment  Plan Summary: Daily contact with patient to assess and evaluate symptoms and progress in treatment and Medication management   Ms. Melder is a 67 year old female with a history of schizophrenia admitted for paranoia in the context of medication changes and treatment noncompliance.  #Psychosis, not improving -discontinue oral Haldol -start Zyprexa 20 mg daily PO or 10 mg daily IM per NEFM order -give Haldol decanoate 50 mg IM  #Poor oral intake and vomiting -medicine consult is appreciated -zofran as needed -KUB unremarkable  #Insomnia -continue Trazodone to 150 mg nightly  #HTN -continue Atenolol 100 mg, Lisinopril 20 mg and HCTZ 25 mg daily -continue ASA 81 mg daily  #DM -continue Metformin 500 mg BID - ADA diet, SSI, BS monitoring  #Hyperlipidemia -continue Crestor 40 mg daily  #Metabolic syndrome monitoring -Lipid panelandTSH are normalHgbA1C6.2 -EKG, QTc 466  #Disposition -discharge to home with her son -follow up with [facility]     Orson Slick, MD 09/16/2017, 2:50 PM

## 2017-09-16 NOTE — Progress Notes (Signed)
Recreation Therapy Notes  Date: 01.08.2019  Time: 9:30 am  Location: Craft Room  Behavioral response: N/A  Intervention Topic: Team work  Discussion/Intervention: Patient did not attend group. Clinical Observations/Feedback:  Patient did not attend group.  Advit Trethewey LRT/CTRS          Mitra Duling 09/16/2017 10:40 AM

## 2017-09-16 NOTE — Plan of Care (Signed)
Patient slept for Estimated Hours of 6.5; Precautionary checks every 15 minutes for safety maintained, room free of safety hazards, patient sustains no injury or falls during this shift.

## 2017-09-16 NOTE — Plan of Care (Signed)
Patient is oriented to unit. Patient's safety is maintained while on unit. Patient is refusing CBGs to be completed.   Progressing Education: Knowledge of [place] Education information/materials will improve 09/16/2017 2108 - Progressing by Alyson Locket I, RN Safety: Ability to remain free from injury will improve 09/16/2017 2108 - Progressing by Anson [place], RN   Not Progressing Health Behavior/Discharge Planning: Compliance with prescribed medication regimen will improve 09/16/2017 2108 - Not Progressing by Anson [place], RN

## 2017-09-17 ENCOUNTER — Ambulatory Visit: Payer: 59 | Admitting: Psychiatry

## 2017-09-17 LAB — URINALYSIS, COMPLETE (UACMP) WITH MICROSCOPIC
Bilirubin Urine: NEGATIVE
Glucose, UA: NEGATIVE mg/dL
Ketones, ur: NEGATIVE mg/dL
Leukocytes, UA: NEGATIVE
Nitrite: NEGATIVE
Protein, ur: 30 mg/dL — AB
Specific Gravity, Urine: 1.021 (ref 1.005–1.030)
pH: 6 (ref 5.0–8.0)

## 2017-09-17 LAB — BASIC METABOLIC PANEL
Anion gap: 13 (ref 5–15)
BUN: 33 mg/dL — ABNORMAL HIGH (ref 6–20)
CO2: 26 mmol/L (ref 22–32)
Calcium: 9.6 mg/dL (ref 8.9–10.3)
Chloride: 98 mmol/L — ABNORMAL LOW (ref 101–111)
Creatinine, Ser: 1.31 mg/dL — ABNORMAL HIGH (ref 0.44–1.00)
GFR calc Af Amer: 49 mL/min — ABNORMAL LOW (ref >60)
GFR calc non Af Amer: 42 mL/min — ABNORMAL LOW (ref >60)
Glucose, Bld: 145 mg/dL — ABNORMAL HIGH (ref 65–99)
Potassium: 2.8 mmol/L — ABNORMAL LOW (ref 3.5–5.1)
Sodium: 137 mmol/L (ref 135–145)

## 2017-09-17 LAB — GLUCOSE, CAPILLARY
Glucose-Capillary: 140 mg/dL — ABNORMAL HIGH (ref 65–99)
Glucose-Capillary: 143 mg/dL — ABNORMAL HIGH (ref 65–99)
Glucose-Capillary: 124 mg/dL — ABNORMAL HIGH (ref 65–99)
Glucose-Capillary: 124 mg/dL — ABNORMAL HIGH (ref 65–99)

## 2017-09-17 MED ORDER — OLANZAPINE 5 MG PO TBDP
20.0000 mg | ORAL_TABLET | Freq: Every day | ORAL | Status: DC
  Administered 2017-09-18: 20 mg via ORAL
  Filled 2017-09-17: qty 4

## 2017-09-17 NOTE — BHH Group Notes (Signed)
09/17/2017 1PM  Type of Therapy/Topic:  Group Therapy:  Emotion Regulation  Participation Level:  Did Not Attend   Description of Group:   The purpose of this group is to assist patients in learning to regulate negative emotions and experience positive emotions. Patients will be guided to discuss ways in which they have been vulnerable to their negative emotions. These vulnerabilities will be juxtaposed with experiences of positive emotions or situations, and patients will be challenged to use positive emotions to combat negative ones. Special emphasis will be placed on coping with negative emotions in conflict situations, and patients will process healthy conflict resolution skills.  Therapeutic Goals: 1. Patient will identify two positive emotions or experiences to reflect on in order to balance out negative emotions 2. Patient will label two or more emotions that they find the most difficult to experience 3. Patient will demonstrate positive conflict resolution skills through discussion and/or role plays  Summary of Patient Progress: Patient was encouraged and invited to attend group. Patient did not attend group. Social worker will continue to encourage group participation in the future.       Therapeutic Modalities:   Cognitive Behavioral Therapy Feelings Identification Dialectical Behavioral Therapy   Darin Engels, [place] 09/17/2017 1:46 PM

## 2017-09-17 NOTE — BHH Group Notes (Signed)
[facility] Group Notes:  (Nursing/MHT/Case Management/Adjunct)  Date:  09/17/2017  Time:  11:33 PM  Type of Therapy:  Group Therapy  Participation Level:  Active  Participation Quality:  She talk about her missing clothes.  Affect:  Appropriate  Cognitive:  Alert  Insight:  Good  Engagement in Group:  Engaged  Modes of Intervention:  Support  Summary of Progress/Problems:  Nehemiah Settle 09/17/2017, 11:33 PM

## 2017-09-17 NOTE — Progress Notes (Signed)
D: Patient stated slept good last night .Stated appetite is good and energy level  Is normal. Stated concentration is good .  No auditory hallucinations  No pain concerns . Appropriate ADL'S. Interacting with peers and staff.  Patient eating fast noted to get chocked  during  Lunch  Noted  More responsive  After morning meds Continue to move about unit  With walker . A: Encourage patient participation with unit programming . Instruction  Given on  Medication , verbalize understanding. R: Voice no other concerns. Staff continue to monitor

## 2017-09-17 NOTE — Plan of Care (Signed)
Patient needing  some redirections  with [facility] Information.  Compliant  with medication  needing redirection . Working  on Building surveyor some assistance  with ADL's Gait  normal for patient psychical therapy signed off on patient , didn't need constant therapy  , no injury this admission. No concerns around sleep. Progressing Education: Knowledge of [place] Education information/materials will improve 09/17/2017 1453 - Progressing by Leodis Liverpool, RN Emotional status will improve 09/17/2017 1453 - Progressing by Leodis Liverpool, RN Mental status will improve 09/17/2017 1453 - Progressing by Leodis Liverpool, RN Verbalization of understanding the information provided will improve 09/17/2017 1453 - Progressing by Leodis Liverpool, RN Coping: Ability to cope will improve 09/17/2017 1453 - Progressing by Leodis Liverpool, RN Ability to verbalize feelings will improve 09/17/2017 1453 - Progressing by Leodis Liverpool, RN Health Behavior/Discharge Planning: Compliance with prescribed medication regimen will improve 09/17/2017 1453 - Progressing by Leodis Liverpool, RN Role Relationship: Ability to communicate needs accurately will improve 09/17/2017 1453 - Progressing by Leodis Liverpool, RN Ability to interact with others will improve 09/17/2017 1453 - Progressing by Leodis Liverpool, RN Safety: Ability to redirect hostility and anger into socially appropriate behaviors will improve 09/17/2017 1453 - Progressing by Leodis Liverpool, RN Ability to remain free from injury will improve 09/17/2017 1453 - Progressing by Leodis Liverpool, RN Self-Care: Ability to participate in self-care as condition permits will improve 09/17/2017 1453 - Progressing by Leodis Liverpool, RN Self-Concept: Ability to verbalize positive feelings about self will improve 09/17/2017 1453 - Progressing by Leodis Liverpool, RN Activity: Sleeping patterns will improve 09/17/2017 1453 - Progressing by Leodis Liverpool, RN

## 2017-09-17 NOTE — Evaluation (Signed)
Physical Therapy Evaluation Patient Details Name: Lauren [name] MRN: [id number] DOB: 11-Dec-1950 Today's Date: 09/17/2017   History of Present Illness  Pt is a 67 year old female with a history of schizophrenia.  The patient was brought to the ER by her son for increased paranoia, disorganized thinking, deterioration of home functioning and agitation. She started experiencing "spirits" and had to barr her bedroom door for protection. It is not easy to piece up her story but aparently the patient has been doing well for years on Haldol 6 mg. It was lowered to 4 mg several months ago and recently substituted with Seroquel due to tremor. The patientadmited that she "did not trust her new doctor"and did not take Seroquel. Pt struggled to answer questions, her speech was slow and with great latency. She denied depressive symptoms or excessive anxiety. She denied drug or alcohol use.Past psychiatric history. Long history of mental illness with multiple medication trials but the patient unable to name any except Seroquel. She was on Geodon few years back. She has never been on injectable. Her last hospitalization was probably 10 years ago. She reports one suicide attempt "a long time ago". Assessment includes: Psychosis, poor oral intake with vomiting, insomnia, HTN, DM, and HLD.    Clinical Impression  Pt reports soreness in L knee especially after periods of inactivity but improves with movement.  Pt Ind with all functional mobility tasks and was able to amb >300' with Mod Ind with use of RW.  Pt steady with all WB activities and did well during balance assessment including with feet together with eyes closed and head turns.  Pt reports that she does not wish to have further PT services and that will exercise on her own at home.  Pt education provided regarding physiological benefits of activity and on principles of activity progression.  Will complete PT orders at this time and will reassess pt  pending a change in status upon receipt of new PT orders.      Follow Up Recommendations No PT follow up    Equipment Recommendations  None recommended by PT    Recommendations for Other Services       Precautions / Restrictions Precautions Precautions: None Restrictions Weight Bearing Restrictions: No      Mobility  Bed Mobility Overal bed mobility: Independent                Transfers Overall transfer level: Independent Equipment used: Rolling walker (2 wheeled)             General transfer comment: Pt steady up and down from bed in low position  Ambulation/Gait Ambulation/Gait assistance: Modified independent (Device/Increase time) Ambulation Distance (Feet): 300 Feet Assistive device: Rolling walker (2 wheeled) Gait Pattern/deviations: WFL(Within Functional Limits)   Gait velocity interpretation: at or above normal speed for age/gender General Gait Details: Min verbal cues for amb closer to RW for general safety with good carry over  Stairs            Wheelchair Mobility    Modified Rankin (Stroke Patients Only)       Balance Overall balance assessment: Independent                                           Pertinent Vitals/Pain Pain Assessment: 0-10 Pain Score: 4  Pain Location: L knee Pain Descriptors / Indicators: Sore Pain Intervention(s): Monitored during session  Home Living Family/patient expects to be discharged to:: Private residence Living Arrangements: Children(Son) Available Help at Discharge: Family;Available PRN/intermittently(Son and sister) Type of Home: House Home Access: Ramped entrance     Home Layout: One level Home Equipment: Walker - 2 wheels      Prior Function Level of Independence: Independent         Comments: Ind amb without AD community distances.  Pt Ind with ADLs and reports 2 falls in the last year slipping on wet floor.       Hand Dominance   Dominant Hand:  Right    Extremity/Trunk Assessment   Upper Extremity Assessment Upper Extremity Assessment: Overall WFL for tasks assessed    Lower Extremity Assessment Lower Extremity Assessment: Overall WFL for tasks assessed       Communication   Communication: No difficulties  Cognition Arousal/Alertness: Awake/alert Behavior During Therapy: WFL for tasks assessed/performed Overall Cognitive Status: Within Functional Limits for tasks assessed                                        General Comments General comments (skin integrity, edema, etc.): Pt steady with feet apart and together with eyes closed and head turns without UE support    Exercises Other Exercises Other Exercises: Pt educated on physiolgical benefits of activity and principles of activity progression    Assessment/Plan    PT Assessment Patent does not need any further PT services  PT Problem List         PT Treatment Interventions      PT Goals (Current goals can be found in the Care Plan section)  Acute Rehab PT Goals PT Goal Formulation: All assessment and education complete, DC therapy    Frequency     Barriers to discharge        Co-evaluation               AM-PAC PT "6 Clicks" Daily Activity  Outcome Measure Difficulty turning over in bed (including adjusting bedclothes, sheets and blankets)?: None Difficulty moving from lying on back to sitting on the side of the bed? : None Difficulty sitting down on and standing up from a chair with arms (e.g., wheelchair, bedside commode, etc,.)?: None Help needed moving to and from a bed to chair (including a wheelchair)?: None Help needed walking in hospital room?: None Help needed climbing 3-5 steps with a railing? : None 6 Click Score: 24    End of Session   Activity Tolerance: Patient tolerated treatment well Patient left: in bed Nurse Communication: Mobility status PT Visit Diagnosis: Difficulty in walking, not elsewhere  classified (R26.2)    Time: 5573-2202 PT Time Calculation (min) (ACUTE ONLY): 25 min   Charges:   PT Evaluation $PT Eval Low Complexity: 1 Low PT Treatments $Therapeutic Activity: 8-22 mins   PT G Codes:        DRoyetta Asal PT, DPT 09/17/17, 12:41 PM

## 2017-09-17 NOTE — [facility] Group Notes (Signed)
[facility] Group Notes:  (Nursing/MHT/Case Management/Adjunct)  Date:  09/17/2017  Time:  3:15 PM  Type of Therapy:  Psychoeducational Skills  Participation Level:  Minimal  Participation Quality:  Appropriate and Attentive  Affect:  Appropriate  Cognitive:  Appropriate  Insight:  Appropriate and Good  Engagement in Group:  Engaged  Modes of Intervention:  Discussion and Education  Summary of Progress/Problems:  Lauren [name] 09/17/2017, 3:15 PM

## 2017-09-17 NOTE — [facility] Group Notes (Signed)
[facility] Group Notes:  (Nursing/MHT/Case Management/Adjunct)  Date:  09/17/2017  Time:  3:15 PM  Type of Therapy:  Psychoeducational Skills  Participation Level:  Did Not Attend   Alois Cliche 09/17/2017, 3:15 PM

## 2017-09-17 NOTE — Progress Notes (Signed)
Recreation Therapy Notes  Date: 01.09.2019  Time: 9:30 am  Location: Craft Room  Behavioral response: N/A  Intervention Topic: Creative Expressions  Discussion/Intervention: Patient did not attend group. Clinical Observations/Feedback:  Patient did not attend  group.  Aylani Spurlock LRT/CTRS          Marnie Fazzino 09/17/2017 11:37 AM 

## 2017-09-17 NOTE — Progress Notes (Signed)
[facility] MD Progress Note  09/17/2017 7:23 PM Lauren [name]  MRN:  [id number]  Subjective:   Lauren [name] is still very paranoid. She is trying to calle her sister frantically believing that her sister's life is in danger. She does not wish [place] to contact her family. I was unable to reach her son who is 65 years old. She felt better this morning and started working with PT. She refused any follow up and toled me that she knows what exercises to use at home. Walker was not recommended. She ate breakfast this morning bu vomitted again at lunch. Potassium is low again. Medicine consult is greatly appreciated.  Treatment plan. She received 50 mg of Haldol decanoate on 09/16/2017 and continues Zyprexa for psychosis. She refused Depakote as she believed it made her nauseated. There are no tremors that she reported in the past.  Social/disposition. She will return home with her son and follow up with [facility]. We must contact family before discharge. Family meeting would be very helpful.  Principal Problem: Schizophrenia, undifferentiated ([place]) Diagnosis:   Patient Active Problem List   Diagnosis Date Noted  . Schizophrenia, undifferentiated ([place]) [F20.3] 09/08/2017    Priority: High  . Screening for cervical cancer [Z12.4] 07/29/2017  . 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 Gravida 1 [IMO0002] 10/26/2015  . Dentagra [K08.89] 10/26/2015  . Itch of skin [L29.9] 10/26/2015  . Sex counseling [Z70.9] 10/26/2015  . Excessive sweating [R61] 07/05/2015  . Acid reflux [K21.9] 06/29/2015  . Encounter for pre-employment examination [Z02.1] 06/29/2015  . Diabetes mellitus type 2, controlled, without complications ([place]) [O27.7] 06/29/2015  . Cardiac murmur [R01.1] 06/29/2015  . Arthritis of knee, degenerative [M17.10] 06/28/2015  . Stiffness of both knees [M25.661, M25.662] 06/22/2015  . Insomnia w/ sleep apnea [G47.00, G47.30] 03/21/2015  . Anxiety disorder [F41.9]  03/21/2015  . Hypertension [I10] 03/21/2015  . HLD (hyperlipidemia) [E78.5] 03/21/2015  . Paranoid schizophrenia ([place]) [F20.0] 03/21/2015  . Urge incontinence [N39.41] 03/21/2015  . Breathlessness on exertion [R06.81] 02/02/2014  . Awareness of heartbeats [R00.2] 02/02/2014  . Apnea, sleep [G47.30] 02/02/2014   Total Time spent with patient: 20 minutes  Past Psychiatric History: schizophrenia  Past Medical History:  Past Medical History:  Diagnosis Date  . Anxiety   . Diabetes mellitus   . Fibromyalgia   . GERD (gastroesophageal reflux disease)   . Heart murmur   . Herniated disc   . Hyperlipidemia   . Hypertension   . Lack of bladder control   . Lung tumor   . Rheumatoid arthritis ([place])   . Schizoaffective disorder ([place])   . Seizure [facility])    after brain surgery 2012. last seizure 2013!  [name] Sleep apnea     Past Surgical History:  Procedure Laterality Date  . BRAIN TUMOR EXCISION  2012   benign  . CARDIAC CATHETERIZATION  2008  . CESAREAN SECTION    . CYST REMOVAL HAND    . LUNG LOBECTOMY  1977   benign tumor  . 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 Heart disease Brother   . Depression Mother   . Heart attack Mother   . Stroke Mother   . Alcohol abuse Father   . Stroke Father   . Diabetes Sister   . Diabetes  Sister   . Stomach cancer Sister   . Kidney disease Sister   . COPD Brother   . Lung cancer Brother   . Diabetes Brother    Family Psychiatric  History: none reported, possibly son with mental illness Social History:  Social History   Substance and Sexual Activity  Alcohol Use No  . Alcohol/week: 0.0 oz     Social History   Substance and Sexual Activity  Drug Use No    Social History   Socioeconomic History  . Marital status: Single    Spouse  name: None  . Number of children: None  . Years of education: None  . Highest education level: None  Social Needs  . Financial resource strain: Not hard at all  . Food insecurity - worry: Never true  . Food insecurity - inability: Never true  . Transportation needs - medical: No  . Transportation needs - non-medical: No  Occupational History  . None  Tobacco Use  . Smoking status: Never Smoker  . Smokeless tobacco: Never Used  Substance and Sexual Activity  . Alcohol use: No    Alcohol/week: 0.0 oz  . Drug use: No  . Sexual activity: No  Other Topics Concern  . None  Social History Narrative  . None   Additional Social History:                         Sleep: Fair  Appetite:  Poor  Current Medications: Current Facility-Administered Medications  Medication Dose Route Frequency Provider Last Rate Last Dose  . acetaminophen (TYLENOL) tablet 650 mg  650 mg Oral Q6H PRN [name], [name], MD      . alum & mag hydroxide-simeth (MAALOX/MYLANTA) 200-200-20 MG/5ML suspension 30 mL  30 mL Oral Q4H PRN [name], [name], MD   30 mL at 09/11/17 1952  . aspirin EC tablet 81 mg  81 mg Oral QAC breakfast [name], [name], MD   81 mg at 09/17/17 0825  . feeding supplement (GLUCERNA SHAKE) (GLUCERNA SHAKE) liquid 237 mL  237 mL Oral TID BM [name], [name], MD   237 mL at 09/17/17 1447  . haloperidol decanoate (HALDOL DECANOATE) 100 MG/ML injection 50 mg  50 mg Intramuscular Q28 days [name], [name], MD   50 mg at 09/16/17 1726  . hydrOXYzine (ATARAX/VISTARIL) tablet 25 mg  25 mg Oral TID PRN [name], [name], MD      . insulin aspart (novoLOG) injection 0-9 Units  0-9 Units Subcutaneous TID WC [name], [name], MD   1 Units at 09/17/17 1635  . LORazepam (ATIVAN) tablet 0.5 mg  0.5 mg Oral Q4H PRN [name], [name], MD      . magnesium hydroxide (MILK OF MAGNESIA) suspension 30 mL  30 mL Oral Daily PRN [name], [name], MD      . OLANZapine (ZYPREXA) injection 10 mg  10 mg  Intramuscular Q1200 [name], [name], MD   10 mg at 09/16/17 1231  . OLANZapine zydis (ZYPREXA) disintegrating tablet 20 mg  20 mg Oral Daily [name], [name], MD   20 mg at 09/17/17 0824  . ondansetron (ZOFRAN-ODT) disintegrating tablet 4 mg  4 mg Oral Q8H PRN Fritzi Mandes, MD      . rosuvastatin (CRESTOR) tablet 40 mg  40 mg Oral Daily [name], [name], MD   40 mg at 09/17/17 0824  . traZODone (DESYREL) tablet 100 mg  100 mg Oral QHS PRN [name],  [name], MD        Lab Results:  Results for orders placed or performed during the hospital encounter of 09/08/17 (from the past 48 hour(s))  Basic metabolic panel     Status: Abnormal   Collection Time: 09/16/17  6:43 AM  Result Value Ref Range   Sodium 142 135 - 145 mmol/L   Potassium 3.4 (L) 3.5 - 5.1 mmol/L   Chloride 103 101 - 111 mmol/L   CO2 28 22 - 32 mmol/L   Glucose, Bld 113 (H) 65 - 99 mg/dL   BUN 33 (H) 6 - 20 mg/dL   Creatinine, Ser 1.24 (H) 0.44 - 1.00 mg/dL   Calcium 9.8 8.9 - 10.3 mg/dL   GFR calc non Af Amer 45 (L) >60 mL/min   GFR calc Af Amer 52 (L) >60 mL/min    Comment: (NOTE) The eGFR has been calculated using the CKD EPI equation. This calculation has not been validated in all clinical situations. eGFR's persistently <60 mL/min signify possible Chronic Kidney Disease.    Anion gap 11 5 - 15    Comment: Performed at [facility], [address] [place], [place] [address]  Magnesium     Status: None   Collection Time: 09/16/17  6:43 AM  Result Value Ref Range   Magnesium 2.3 1.7 - 2.4 mg/dL    Comment: Performed at [facility], [address] [place], [place] [address]  Glucose, capillary     Status: Abnormal   Collection Time: 09/16/17 11:41 AM  Result Value Ref Range   Glucose-Capillary 117 (H) 65 - 99 mg/dL   Comment 1 Notify RN   Glucose, capillary     Status: Abnormal   Collection Time: 09/16/17  4:47 PM  Result Value Ref Range   Glucose-Capillary 136 (H) 65 - 99 mg/dL    Comment 1 Notify RN   Glucose, capillary     Status: Abnormal   Collection Time: 09/17/17  7:09 AM  Result Value Ref Range   Glucose-Capillary 140 (H) 65 - 99 mg/dL  Basic metabolic panel     Status: Abnormal   Collection Time: 09/17/17  8:16 AM  Result Value Ref Range   Sodium 137 135 - 145 mmol/L   Potassium 2.8 (L) 3.5 - 5.1 mmol/L   Chloride 98 (L) 101 - 111 mmol/L   CO2 26 22 - 32 mmol/L   Glucose, Bld 145 (H) 65 - 99 mg/dL   BUN 33 (H) 6 - 20 mg/dL   Creatinine, Ser 1.31 (H) 0.44 - 1.00 mg/dL   Calcium 9.6 8.9 - 10.3 mg/dL   GFR calc non Af Amer 42 (L) >60 mL/min   GFR calc Af Amer 49 (L) >60 mL/min    Comment: (NOTE) The eGFR has been calculated using the CKD EPI equation. This calculation has not been validated in all clinical situations. eGFR's persistently <60 mL/min signify possible Chronic Kidney Disease.    Anion gap 13 5 - 15    Comment: Performed at [facility], [address] [place], [place] [address]  Glucose, capillary     Status: Abnormal   Collection Time: 09/17/17 11:43 AM  Result Value Ref Range   Glucose-Capillary 143 (H) 65 - 99 mg/dL  Glucose, capillary     Status: Abnormal   Collection Time: 09/17/17  4:31 PM  Result Value Ref Range   Glucose-Capillary 124 (H) 65 - 99 mg/dL    Blood Alcohol level:  Lab Results  Component Value Date   ETH <10 09/06/2017   ETH <10 28/41/3244    Metabolic Disorder Labs: Lab Results  Component Value Date   HGBA1C 6.2 (H) 09/09/2017   MPG 131.24 09/09/2017   No results found for: PROLACTIN Lab Results  Component Value Date   CHOL 211 (H) 09/09/2017   TRIG 102 09/09/2017   HDL 38 (L) 09/09/2017   CHOLHDL 5.6 09/09/2017   VLDL 20 09/09/2017   LDLCALC 153 (H) 09/09/2017   LDLCALC 102 (H) 12/20/2016    Physical Findings: AIMS: Facial and Oral Movements Muscles of Facial Expression: None, normal Lips and Perioral Area: None, normal Jaw: None, normal Tongue: None, normal,Extremity  Movements Upper (arms, wrists, hands, fingers): None, normal Lower (legs, knees, ankles, toes): None, normal, Trunk Movements Neck, shoulders, hips: None, normal, Overall Severity Severity of abnormal movements (highest score from questions above): None, normal Incapacitation due to abnormal movements: None, normal Patient's awareness of abnormal movements (rate only patient's report): No Awareness, Dental Status Current problems with teeth and/or dentures?: No Does patient usually wear dentures?: No  CIWA:    COWS:     Musculoskeletal: Strength & Muscle Tone: within normal limits Gait & Station: normal Patient leans: N/A  Psychiatric Specialty Exam: Physical Exam  Nursing note and vitals reviewed. Psychiatric: Her affect is blunt. Her speech is delayed. She is slowed and withdrawn. Thought content is paranoid and delusional. Cognition and memory are normal. She expresses impulsivity.    Review of Systems  Gastrointestinal: Positive for nausea and vomiting.  Neurological: Negative.   Psychiatric/Behavioral: Positive for hallucinations.  All other systems reviewed and are negative.   Blood pressure 117/81, pulse (!) 102, temperature 98.2 F (36.8 C), temperature source Oral, resp. rate 20, height _0  (1.626 m), weight 108 kg (238 lb), SpO2 98 %.Body mass index is 40.85 kg/m.  General Appearance: Disheveled  Eye Contact:  Good  Speech:  Blocked and great latency of speech  Volume:  Decreased  Mood:  Dysphoric and Irritable  Affect:  Blunt and Congruent  Thought Process:  Goal Directed and Descriptions of Associations: Intact  Orientation:  Full (Time, Place, and Person)  Thought Content:  Delusions and Paranoid Ideation  Suicidal Thoughts:  No  Homicidal Thoughts:  No  Memory:  Immediate;   Fair Recent;   Fair Remote;   Fair  Judgement:  Poor  Insight:  Lacking  Psychomotor Activity:  Decreased  Concentration:  Concentration: Good and Attention Span: Fair  Recall:   [employer] of Knowledge:  Fair  Language:  Fair  Akathisia:  No  Handed:  Right  AIMS (if indicated):     Assets:  Communication Skills Desire for Improvement Financial Resources/Insurance Housing Physical Health Resilience Social Support  ADL's:  Intact  Cognition:  WNL  Sleep:  Number of Hours: 6.5     Treatment Plan Summary: Daily contact with patient to assess and evaluate symptoms and progress in treatment and Medication management   Lauren [name] is a 67 year old female with a history of schizophrenia admitted for paranoia in the context of medication changes and treatment noncompliance.  #Psychosis, not improving -discontinue oral Haldol -start Zyprexa 20 mg daily PO or 10 mg daily IM per [facility] order -give Haldol decanoate 50 mg IM on 09/16/2017  #Poor oral intake and vomiting -medicine consult is appreciated -zofran as needed -KUB unremarkable -hypokaliemia  #Insomnia -continue Trazodone to 150 mg nightly  #HTN -continue Atenolol 100 mg, Lisinopril 20 mg and HCTZ 25 mg  daily -continue ASA 81 mg daily  #DM -continue Metformin 500 mg BID -ADA diet, SSI, BS monitoring  #Hyperlipidemia -continue Crestor 40 mg daily  #Metabolic syndrome monitoring -Lipid panelandTSH are normalHgbA1C6.2 -EKG, QTc 466  #Disposition -discharge to home with her son -follow up with [facility]     Orson Slick, MD 09/17/2017, 7:23 PM

## 2017-09-18 LAB — GLUCOSE, CAPILLARY
Glucose-Capillary: 136 mg/dL — ABNORMAL HIGH (ref 65–99)
Glucose-Capillary: 123 mg/dL — ABNORMAL HIGH (ref 65–99)
Glucose-Capillary: 116 mg/dL — ABNORMAL HIGH (ref 65–99)
Glucose-Capillary: 128 mg/dL — ABNORMAL HIGH (ref 65–99)
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
## 2017-09-18 NOTE — [facility] Group Notes (Signed)
[facility] Group Notes:  (Nursing/MHT/Case Management/Adjunct)  Date:  09/18/2017  Time:  5:36 PM  Type of Therapy:  Psychoeducational Skills  Participation Level:  Active  Participation Quality:  Appropriate and Attentive  Affect:  Appropriate  Cognitive:  Appropriate  Insight:  Appropriate and Good  Engagement in Group:  Engaged  Modes of Intervention:  Discussion and Education  Summary of Progress/Problems:  Lauren [name] 09/18/2017, 5:36 PM

## 2017-09-18 NOTE — Progress Notes (Signed)
Recreation Therapy Notes   Date: 01.10.2019  Time: 9:30 am  Location: Craft Room  Behavioral response: N/A  Intervention Topic: Stress  Discussion/Intervention: Patient did not attend group. Clinical Observations/Feedback:  Patient did not attend group. Malvina Schadler LRT/CTRS            Hanan Mcwilliams 09/18/2017 10:40 AM

## 2017-09-18 NOTE — Progress Notes (Addendum)
Lauren Lauren [name] Progress Note  09/18/2017 2:43 PM Lauren Lauren [name]  MRN:  [id number]  Subjective:   Lauren Lauren [name] is still paranoid, delusional and hallucinating. She is however slightly more interactive. She no longer hides under the covers and is abkle to hold a small conversation. She reports that her appetite has return and she is no longer vomiting. Last emesis at lunch yesterday. She still does not allow any family contact and has been cryptic about her son. Refused to work further with PT. Last night, she displayed a lot of paranoia. See nursing note.   Per nursing: Patient found in bed upon my arrival. Patient is visible but not social throughout the evening. Patient attended group and followed all staff direction. Patient reports she does not like the taste of nutritional supplement. Denies pain. Upon initial assessment, patient reports only AH and describes them as "small." At bedtime, patient comes to Probation officer and states, "the firey balls of hell are in my room." Accompanied patient to her room and performed a search. Patient reports that "the firey balls of hell are in my bed launching pellets into my back." Patient indicates her lower back. Reports that she thinks they are there because she observed a prostitution ring operating out of [facility] ER. Believes the firey balls of hell found out and are punishing her. Patient reports she observed the prostitution ring operating on November 19th, 2018. Writer offered to change her linen, patient refused. Patient requested a room change but was told there were currently no empty rooms. Offered for the light to remain on and door open but patient declined. "No, I want my door closed." Offered to check on patient through the night to ensure safety. Patient agreed to this and is observed sleeping @2300 . Reports eating and voiding adequately. Denies pain. Gait is steady but patient continues to utilize walker. Patient refused snack. CBG 124, no  coverage ordered. Q 15 minute checks maintained. Will continue to monitor throughout the shift. Patient slept 7 hours. Reports sleeping adequately. Compliant with am vitals. MHT to perform accucheck @ 0700. Will endorse care to oncoming shift.  Spoke with two of her sisters who are very concerned but unable to provide any support. It is unclear if her son, Lauren Lauren [name] who has mental illness or developmental disability, is still living with the patient. We were unable to get him on the phone. The patient has been in the hospital since the last day of December and it is unclear if her bills are paid. The sisters [phone] report that the patient does not have close ties to the family and has always been "strange". She however was able to manage until last fall when she started experiencing increased paranoia. She did very well in the past on Haldol.  Tried to call her apartment repeatedly with no answer.   Treatment plan. We continue Zyprexa 20 mg nightly and haldol decanoate injections we started on 1/8. She refuses many medications.  Social/disposition. Discharge to her apartment. Follow up is a problem. She no longer can see Lauren Lauren [name] at [facility] but has been very suspicious about the other physician, Lauren Lauren [name], there. With [facility], we try to refer her to [facility]. She would benefit from ACT team or peer support if paranoia improves.  Principal Problem: Schizophrenia, undifferentiated ([place]) Diagnosis:   Patient Active Problem List   Diagnosis Date Noted  . Schizophrenia, undifferentiated ([place]) [F20.3] 09/08/2017    Priority: High  . Screening for cervical cancer [Z12.4] 07/29/2017  .  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 Gravida 1 [IMO0002] 10/26/2015  . Dentagra [K08.89] 10/26/2015  . Itch of skin [L29.9] 10/26/2015  . Sex counseling [Z70.9] 10/26/2015  . Excessive sweating [R61] 07/05/2015  . Acid reflux [K21.9] 06/29/2015  . Encounter for  pre-employment examination [Z02.1] 06/29/2015  . Diabetes mellitus type 2, controlled, without complications ([place]) [K93.8] 06/29/2015  . Cardiac murmur [R01.1] 06/29/2015  . Arthritis of knee, degenerative [M17.10] 06/28/2015  . Stiffness of both knees [M25.661, M25.662] 06/22/2015  . Insomnia w/ sleep apnea [G47.00, G47.30] 03/21/2015  . Anxiety disorder [F41.9] 03/21/2015  . Hypertension [I10] 03/21/2015  . HLD (hyperlipidemia) [E78.5] 03/21/2015  . Paranoid schizophrenia ([place]) [F20.0] 03/21/2015  . Urge incontinence [N39.41] 03/21/2015  . Breathlessness on exertion [R06.81] 02/02/2014  . Awareness of heartbeats [R00.2] 02/02/2014  . Apnea, sleep [G47.30] 02/02/2014   Total Time spent with patient: 45 minutes  Past Psychiatric History: schizophrenia  Past Medical History:  Past Medical History:  Diagnosis Date  . Anxiety   . Diabetes mellitus   . Fibromyalgia   . GERD (gastroesophageal reflux disease)   . Heart murmur   . Herniated disc   . Hyperlipidemia   . Hypertension   . Lack of bladder control   . Lung tumor   . Rheumatoid arthritis ([place])   . Schizoaffective disorder ([place])   . Seizure [facility])    after brain surgery 2012. last seizure 2013!  [name] Sleep apnea     Past Surgical History:  Procedure Laterality Date  . BRAIN TUMOR EXCISION  2012   benign  . CARDIAC CATHETERIZATION  2008  . CESAREAN SECTION    . CYST REMOVAL HAND    . LUNG LOBECTOMY  1977   benign tumor  . TOTAL KNEE ARTHROPLASTY Left 01/28/2017   Procedure: TOTAL KNEE ARTHROPLASTY;  Surgeon: Lauren Lauren [name], Lauren [name];  Location: [facility] ORS;  Service: Orthopedics;  Laterality: Left;   Family History:  Family History  Problem Relation Age of Onset  . Heart disease Brother   . Depression Mother   . Heart attack Mother   . Stroke Mother   . Alcohol abuse Father   . Stroke Father   . Diabetes Sister   . Diabetes Sister   . Stomach cancer Sister   . Kidney disease Sister   . COPD Brother   . Lung cancer  Brother   . Diabetes Brother    Family Psychiatric  History: son with mental illness Social History:  Social History   Substance and Sexual Activity  Alcohol Use No  . Alcohol/week: 0.0 oz     Social History   Substance and Sexual Activity  Drug Use No    Social History   Socioeconomic History  . Marital status: Single    Spouse name: None  . Number of children: None  . Years of education: None  . Highest education level: None  Social Needs  . Financial resource strain: Not hard at all  . Food insecurity - worry: Never true  . Food insecurity - inability: Never true  . Transportation needs - medical: No  . Transportation needs - non-medical: No  Occupational History  . None  Tobacco Use  . Smoking status: Never Smoker  . Smokeless tobacco: Never Used  Substance and Sexual Activity  . Alcohol use: No    Alcohol/week: 0.0 oz  . Drug use: No  . Sexual activity: No  Other Topics Concern  . None  Social  History Narrative  . None   Additional Social History:                         Sleep: Fair  Appetite:  Fair  Current Medications: Current Facility-Administered Medications  Medication Dose Route Frequency Provider Last Rate Last Dose  . acetaminophen (TYLENOL) tablet 650 mg  650 mg Oral Q6H PRN [name], [name], Lauren [name]      . alum & mag hydroxide-simeth (MAALOX/MYLANTA) 200-200-20 MG/5ML suspension 30 mL  30 mL Oral Q4H PRN [name], [name], Lauren [name]   30 mL at 09/11/17 1952  . aspirin EC tablet 81 mg  81 mg Oral QAC breakfast [name], [name], Lauren [name]   81 mg at 09/18/17 0759  . feeding supplement (GLUCERNA SHAKE) (GLUCERNA SHAKE) liquid 237 mL  237 mL Oral TID BM Daymion Nazaire B, Lauren [name]   237 mL at 09/18/17 1417  . haloperidol decanoate (HALDOL DECANOATE) 100 MG/ML injection 50 mg  50 mg Intramuscular Q28 days Shaymus Eveleth B, Lauren [name]   50 mg at 09/16/17 1726  . hydrOXYzine (ATARAX/VISTARIL) tablet 25 mg  25 mg Oral TID PRN [name], [name], Lauren [name]      . insulin aspart  (novoLOG) injection 0-9 Units  0-9 Units Subcutaneous TID WC Gunnison Chahal B, Lauren [name]   1 Units at 09/18/17 1156  . LORazepam (ATIVAN) tablet 0.5 mg  0.5 mg Oral Q4H PRN [name], [name], Lauren [name]      . magnesium hydroxide (MILK OF MAGNESIA) suspension 30 mL  30 mL Oral Daily PRN [name], [name], Lauren [name]      . OLANZapine zydis (ZYPREXA) disintegrating tablet 20 mg  20 mg Oral QHS Oakleigh Hesketh B, Lauren [name]      . ondansetron (ZOFRAN-ODT) disintegrating tablet 4 mg  4 mg Oral Q8H PRN Fritzi Mandes, Lauren [name]      . rosuvastatin (CRESTOR) tablet 40 mg  40 mg Oral Daily [name], [name], Lauren [name]   40 mg at 09/18/17 0759  . traZODone (DESYREL) tablet 100 mg  100 mg Oral QHS PRN [name], [name], Lauren [name]        Lab Results:  Results for orders placed or performed during the hospital encounter of 09/08/17 (from the past 48 hour(s))  Glucose, capillary     Status: Abnormal   Collection Time: 09/16/17  4:47 PM  Result Value Ref Range   Glucose-Capillary 136 (H) 65 - 99 mg/dL   Comment 1 Notify RN   Glucose, capillary     Status: Abnormal   Collection Time: 09/17/17  7:09 AM  Result Value Ref Range   Glucose-Capillary 140 (H) 65 - 99 mg/dL  Basic metabolic panel     Status: Abnormal   Collection Time: 09/17/17  8:16 AM  Result Value Ref Range   Sodium 137 135 - 145 mmol/L   Potassium 2.8 (L) 3.5 - 5.1 mmol/L   Chloride 98 (L) 101 - 111 mmol/L   CO2 26 22 - 32 mmol/L   Glucose, Bld 145 (H) 65 - 99 mg/dL   BUN 33 (H) 6 - 20 mg/dL   Creatinine, Ser 1.31 (H) 0.44 - 1.00 mg/dL   Calcium 9.6 8.9 - 10.3 mg/dL   GFR calc non Af Amer 42 (L) >60 mL/min   GFR calc Af Amer 49 (L) >60 mL/min    Comment: (NOTE) The eGFR has been calculated using the CKD EPI equation. This calculation has not been validated in all clinical situations. eGFR's persistently <60  mL/min signify possible Chronic Kidney Disease.    Anion gap 13 5 - 15    Comment: Performed at [facility], [address] [place], [place] [address]  Glucose,  capillary     Status: Abnormal   Collection Time: 09/17/17 11:43 AM  Result Value Ref Range   Glucose-Capillary 143 (H) 65 - 99 mg/dL  Glucose, capillary     Status: Abnormal   Collection Time: 09/17/17  4:31 PM  Result Value Ref Range   Glucose-Capillary 124 (H) 65 - 99 mg/dL  Glucose, capillary     Status: Abnormal   Collection Time: 09/17/17  8:27 PM  Result Value Ref Range   Glucose-Capillary 124 (H) 65 - 99 mg/dL  Urinalysis, Complete w Microscopic     Status: Abnormal   Collection Time: 09/17/17  9:20 PM  Result Value Ref Range   Color, Urine YELLOW (A) YELLOW   APPearance HAZY (A) CLEAR   Specific Gravity, Urine 1.021 1.005 - 1.030   pH 6.0 5.0 - 8.0   Glucose, UA NEGATIVE NEGATIVE mg/dL   Hgb urine dipstick MODERATE (A) NEGATIVE   Bilirubin Urine NEGATIVE NEGATIVE   Ketones, ur NEGATIVE NEGATIVE mg/dL   Protein, ur 30 (A) NEGATIVE mg/dL   Nitrite NEGATIVE NEGATIVE   Leukocytes, UA NEGATIVE NEGATIVE   RBC / HPF 0-5 0 - 5 RBC/hpf   WBC, UA 0-5 0 - 5 WBC/hpf   Bacteria, UA RARE (A) NONE SEEN   Squamous Epithelial / LPF 0-5 (A) NONE SEEN    Comment: Performed at [facility], [address] [place], [place] [address]  Glucose, capillary     Status: Abnormal   Collection Time: 09/18/17  7:02 AM  Result Value Ref Range   Glucose-Capillary 136 (H) 65 - 99 mg/dL   Comment 1 Notify RN   Glucose, capillary     Status: Abnormal   Collection Time: 09/18/17 11:22 AM  Result Value Ref Range   Glucose-Capillary 123 (H) 65 - 99 mg/dL   Comment 1 Notify RN     Blood Alcohol level:  Lab Results  Component Value Date   ETH <10 09/06/2017   ETH <10 76/16/0737    Metabolic Disorder Labs: Lab Results  Component Value Date   HGBA1C 6.2 (H) 09/09/2017   MPG 131.24 09/09/2017   No results found for: PROLACTIN Lab Results  Component Value Date   CHOL 211 (H) 09/09/2017   TRIG 102 09/09/2017   HDL 38 (L) 09/09/2017   CHOLHDL 5.6 09/09/2017   VLDL 20 09/09/2017    LDLCALC 153 (H) 09/09/2017   LDLCALC 102 (H) 12/20/2016    Physical Findings: AIMS: Facial and Oral Movements Muscles of Facial Expression: None, normal Lips and Perioral Area: None, normal Jaw: None, normal Tongue: None, normal,Extremity Movements Upper (arms, wrists, hands, fingers): None, normal Lower (legs, knees, ankles, toes): None, normal, Trunk Movements Neck, shoulders, hips: None, normal, Overall Severity Severity of abnormal movements (highest score from questions above): None, normal Incapacitation due to abnormal movements: None, normal Patient's awareness of abnormal movements (rate only patient's report): No Awareness, Dental Status Current problems with teeth and/or dentures?: No Does patient usually wear dentures?: No  CIWA:    COWS:     Musculoskeletal: Strength & Muscle Tone: within normal limits Gait & Station: broad based Patient leans: N/A  Psychiatric Specialty Exam: Physical Exam  Nursing note and vitals reviewed. Psychiatric: Her mood appears anxious. Her affect is labile. Her speech is delayed. She is  slowed, withdrawn and actively hallucinating. Thought content is paranoid and delusional. Cognition and memory are normal. She expresses impulsivity.    Review of Systems  Musculoskeletal: Positive for joint pain.  Neurological: Negative.   Psychiatric/Behavioral: Positive for hallucinations.  All other systems reviewed and are negative.   Blood pressure 117/81, pulse (!) 102, temperature 98.2 F (36.8 C), temperature source Oral, resp. rate 20, height 5' 4"  (1.626 m), weight 108 kg (238 lb), SpO2 98 %.Body mass index is 40.85 kg/m.  General Appearance: Fairly Groomed  Eye Contact:  Good  Speech:  Blocked and Slow  Volume:  Decreased  Mood:  Dysphoric  Affect:  Congruent  Thought Process:  Goal Directed and Descriptions of Associations: Intact  Orientation:  Full (Time, Place, and Person)  Thought Content:  Delusions, Hallucinations:  Auditory Visual and Paranoid Ideation  Suicidal Thoughts:  No  Homicidal Thoughts:  No  Memory:  Immediate;   Fair Recent;   Fair Remote;   Fair  Judgement:  Poor  Insight:  Lacking  Psychomotor Activity:  Psychomotor Retardation  Concentration:  Concentration: Fair and Attention Span: Fair  Recall:  [employer] of Knowledge:  Fair  Language:  Fair  Akathisia:  No  Handed:  Right  AIMS (if indicated):     Assets:  Communication Skills Desire for Improvement Financial Resources/Insurance Housing Physical Health Resilience Social Support  ADL's:  Intact  Cognition:  WNL  Sleep:  Number of Hours: 6.25     Treatment Plan Summary: Daily contact with patient to assess and evaluate symptoms and progress in treatment and Medication management   Ms. Mchan is a 67 year old female with a history of schizophrenia admitted for paranoia in the context of medication changes and treatment noncompliance.  #Psychosis, not improving -discontinue oral Haldol -continue Zyprexa 20 mg nightly -continue Haldol decanoate monthly injections, last on on 09/16/2017 -there are no tremors  #Poor oral intake and vomiting -medicine consult is appreciated -zofran as needed -KUB unremarkable -hypokaliemia  #Insomnia -continue Trazodone to 150 mg nightly  #HTN -continue Atenolol 100 mg, Lisinopril 20 mg and HCTZ 25 mg daily -continue ASA 81 mg daily  #DM -continue Metformin 500 mg BID -ADA diet, SSI, BS monitoring  #Hyperlipidemia -continue Crestor 40 mg daily  #Metabolic syndrome monitoring -Lipid panelandTSH are normalHgbA1C6.2 -EKG, QTc 466  #Disposition -discharge to home with her son -follow up with [facility]     Orson Slick, Lauren [name] 09/18/2017, 2:43 PM

## 2017-09-18 NOTE — Plan of Care (Signed)
  Coping: Ability to cope will improve 09/18/2017 2112 - Progressing by Providence Crosby, RN Note Pt denies AVH at this time   Health Behavior/Discharge Planning: Compliance with prescribed medication regimen will improve 09/18/2017 2112 - Progressing by Providence Crosby, RN Note Pt taking medications as prescribed   Safety: Ability to redirect hostility and anger into socially appropriate behaviors will improve 09/18/2017 2112 - Progressing by Providence Crosby, RN Note Pt safe on the unit at this time

## 2017-09-18 NOTE — Progress Notes (Signed)
D: Pt denies SI/HI/AVH. Pt is pleasant and cooperative. Pt said she was feeling better this evening, even though pt was very reluctant to share information with Probation officer. Pt stated she plans to D/C back to live with her son.   A: Pt was offered support and encouragement. Pt was given scheduled medications. Pt was encourage to attend groups. Q 15 minute checks were done for safety.   R:Pt attends groups and interacts well with peers and staff. Pt is taking medication. Pt has no complaints.Pt receptive to treatment and safety maintained on unit.

## 2017-09-18 NOTE — Plan of Care (Signed)
  Progressing Education: Emotional status will improve 09/18/2017 0104 - Progressing by Derek Mound, RN Health Behavior/Discharge Planning: Compliance with prescribed medication regimen will improve 09/18/2017 0104 - Progressing by Derek Mound, RN

## 2017-09-18 NOTE — BHH Group Notes (Signed)
09/18/2017  Time: 1:00PM  Type of Therapy/Topic:  Group Therapy:  Balance in Life  Participation Level:  Active  Description of Group:   This group will address the concept of balance and how it feels and looks when one is unbalanced. Patients will be encouraged to process areas in their lives that are out of balance and identify reasons for remaining unbalanced. Facilitators will guide patients in utilizing problem-solving interventions to address and correct the stressor making their life unbalanced. Understanding and applying boundaries will be explored and addressed for obtaining and maintaining a balanced life. Patients will be encouraged to explore ways to assertively make their unbalanced needs known to significant others in their lives, using other group members and facilitator for support and feedback.  Therapeutic Goals: 1. Patient will identify two or more emotions or situations they have that consume much of in their lives. 2. Patient will identify signs/triggers that life has become out of balance:  3. Patient will identify two ways to set boundaries in order to achieve balance in their lives:  4. Patient will demonstrate ability to communicate their needs through discussion and/or role plays  Summary of Patient Progress: Pt continues to work towards their tx goals but has not yet reached them. Pt was able to appropriately participate in group discussion, and was able to offer support/validation to other group members. Pt reported two areas of her life she spends too much time on are, "working and not taking good care of my own needs." Pt reported one way she can achieve a better balance in life is to, "say no to people more often." Pt also reported that she can tell her life is out of balance when, "I'm rushing around and I'm tired all the time."   Therapeutic Modalities:   Cognitive Behavioral Therapy Solution-Focused Therapy Assertiveness Training  Alden Hipp, MSW,  LCSW 09/18/2017 1:36 PM

## 2017-09-18 NOTE — BHH Group Notes (Addendum)
  09/18/2017  Time: 0900  Type of Therapy and Topic:  Group Therapy:  Setting Goals Participation Level:  Active  Description of Group: In this process group, patients discussed using strengths to work toward goals and address challenges.  Patients identified two positive things about themselves and one goal they were working on.  Patients were given the opportunity to share openly and support each other's plan for self-empowerment.  The group discussed the value of gratitude and were encouraged to have a daily reflection of positive characteristics or circumstances.  Patients were encouraged to identify a plan to utilize their strengths to work on current challenges and goals.  Therapeutic Goals 1. Patient will verbalize personal strengths/positive qualities and relate how these can assist with achieving desired personal goals 2. Patients will verbalize affirmation of peers plans for personal change and goal setting 3. Patients will explore the value of gratitude and positive focus as related to successful achievement of goals 4. Patients will verbalize a plan for regular reinforcement of personal positive qualities and circumstances.  Summary of Patient Progress: Pt continues to work towards their tx goals but has not yet reached them. Pt was able to appropriately participate in group discussion, and was able to offer support/validation to other group members. Pt reported her goal for today is to, "feel more joy by smiling at least twice by the end of the day."     Therapeutic Modalities Cognitive Behavioral Therapy Motivational Interviewing  Alden Hipp, MSW, LCSW 09/18/2017 9:28 AM

## 2017-09-19 LAB — GLUCOSE, CAPILLARY
Glucose-Capillary: 135 mg/dL — ABNORMAL HIGH (ref 65–99)
Glucose-Capillary: 130 mg/dL — ABNORMAL HIGH (ref 65–99)

## 2017-09-19 MED ORDER — PANTOPRAZOLE SODIUM 40 MG PO TBEC
40.0000 mg | DELAYED_RELEASE_TABLET | Freq: Every day | ORAL | Status: DC
  Administered 2017-09-19: 40 mg via ORAL

## 2017-09-19 MED ORDER — ATENOLOL 100 MG PO TABS
100.0000 mg | ORAL_TABLET | Freq: Every day | ORAL | Status: DC
  Administered 2017-09-19: 100 mg via ORAL
  Filled 2017-09-19: qty 1

## 2017-09-19 MED ORDER — TRAZODONE HCL 100 MG PO TABS: 100.0000 mg | ORAL_TABLET | Freq: Every evening | ORAL | 1 refills | Status: DC | PRN

## 2017-09-19 MED ORDER — OLANZAPINE 20 MG PO TBDP: 20.0000 mg | ORAL_TABLET | Freq: Every day | ORAL | 1 refills | Status: DC

## 2017-09-19 MED ORDER — HALOPERIDOL DECANOATE 100 MG/ML IM SOLN: 50.0000 mg | INTRAMUSCULAR | 1 refills | Status: DC

## 2017-09-19 NOTE — Progress Notes (Signed)
  [facility] Adult Case Management Discharge Plan :  Will you be returning to the same living situation after discharge:  Yes,  Pt will be returning to her apartment At discharge, do you have transportation home?: Yes,  Pt's sister will be picking her up at discharge Do you have the ability to pay for your medications: Yes,  Pt has financial resources to pay for medications  Release of information consent forms completed and in the chart;  Patient's signature needed at discharge.  Patient to Follow up at: Follow-up Information     [facility] Follow up on 09/25/2017.   Specialty:  Behavioral Health Why:  Your follow-up appointment will be with Dr. Posey Boyer on 09/25/17 at 2:30PM. Contact information: [place] [phone]          Next level of care provider has access to [place] and Suicide Prevention discussed: Yes,  No safety issues identified  Have you used any form of tobacco in the last 30 days? (Cigarettes, Smokeless Tobacco, Cigars, and/or Pipes): No  Has patient been referred to the Quitline?: N/A patient is not a smoker  Patient has been referred for addiction treatment: N/A  Devona Konig, LCSW 09/19/2017, 1:40 PM

## 2017-09-19 NOTE — Progress Notes (Signed)
Patient ID: Lauren [name], female   DOB: 04-Jul-1951, 67 y.o.   MRN: [id number]  Discharge Note:  Patient denies SI/HI/AVH at this time. Discharge instructions, AVS, transition record, and prescriptions gone over with patient. Patient belongings returned to patient. Patient agrees to comply with medication management, follow-up visit, and outpatient therapy. Patient questions and concerns addressed and answered. Patient ambulatory off unit. Patient discharged to home with sister.

## 2017-09-19 NOTE — Progress Notes (Signed)
Recreation Therapy Notes  Date: 01.11.2019  Time: 9:30 am  Location: Craft Room  Behavioral response: Appropriate  Intervention Topic: Leisure  Discussion/Intervention: Group content today was focused on leisure. The group defined what leisure is and some positive leisure activities they participate in. Individuals identified the difference between good and bad leisure. Participants expressed how they feel after participating in the leisure of their choice. The group discussed how they go about picking a leisure activity and if others are involved in their leisure activities. The patient stated how many leisure activities they too choose from and reasons why it is important to have leisure time. Individuals participated in the intervention "Leisure Jeopardy" where they had a chance to identify new leisure activities as well as benefits of leisure. Clinical Observations/Feedback:  Patient came to group and stated that she watches television on her free time. She expressed that she likes to watch the browns and the news. Individual was social with peers and staff and participated in group.  Camera Krienke LRT/CTRS         Shadrach Bartunek 09/19/2017 10:25 AM

## 2017-09-19 NOTE — B[facility] Group Notes (Signed)
  09/19/2017  Time: 1:00PM  Type of Therapy and Topic:  Group Therapy:  Feelings around Relapse and Recovery  Participation Level:  Did Not Attend   Description of Group:    Patients in this group will discuss emotions they experience before and after a relapse. They will process how experiencing these feelings, or avoidance of experiencing them, relates to having a relapse. Facilitator will guide patients to explore emotions they have related to recovery. Patients will be encouraged to process which emotions are more powerful. They will be guided to discuss the emotional reaction significant others in their lives may have to their relapse or recovery. Patients will be assisted in exploring ways to respond to the emotions of others without this contributing to a relapse.  Therapeutic Goals: 1. Patient will identify two or more emotions that lead to a relapse for them 2. Patient will identify two emotions that result when they relapse 3. Patient will identify two emotions related to recovery 4. Patient will demonstrate ability to communicate their needs through discussion and/or role plays   Summary of Patient Progress: Pt was invited to attend group but chose not to attend. CSW will continue to encourage pt to attend group throughout their admission.   Therapeutic Modalities:   Cognitive Behavioral Therapy Solution-Focused Therapy Assertiveness Training Relapse Prevention Therapy  Alden Hipp, MSW, LCSW 09/19/2017 1:38 PM

## 2017-09-19 NOTE — Discharge Summary (Signed)
Physician Discharge Summary Note  Patient:  Lauren [name] is an 67 y.o., female MRN:  [id number] DOB:  [birth year] Patient phone:  [phone] (home)  Patient address:   [place] [address],  Total Time spent with patient: 30 minutes  Date of Admission:  09/08/2017 Date of Discharge: 09/19/2017  Reason for Admission:  Psychotic break  Identifying data. Lauren [name], Lauren [name] is a 67 year old female with a history of schizophrenia.  Chief complaint. "Paranoia."  HIstory of present illness. Information was obtained from the patient and the chart. The patient was brought to the ER by her son for increased paranoia, disorganized thinking, deterioration of home functioning and agitation. She started experiencing "spirits" and had to barr her bedroom door for protection. It is not easy to piece up her story but aparently the patient has been doing well for years on Haldol 6 mg. It was lowered to 4 mg several months ago and recently substituted with Seroquel due to tremor. The patient admits that she "did not trust her new doctor" and did not take Seroquel. During the interview, she is really struggling to answer my questions, her speech is slow and with great latency. She denies depressive symptoms or excessive anxiety. She denies drug or alcohol use.  Past psychiatric history. Long history of mental illness. She has responded very well to low dose Haldol for years. Reportedly, she developed tramors recently and was switched to Seroquel which she did not take. Her last hospitalization was probably 10 years ago. She reports one suicide attempt "a long time ago".   Family psychiatric history. Aunt with schizophrenia "a long time ago".   Social history. She is disabled from mental illness. She lives with her son and reports that this is a good arrangement.  Principal Problem: Schizophrenia, undifferentiated ([place]) Discharge Diagnoses: Patient Active Problem List   Diagnosis Date Noted  . Schizophrenia, undifferentiated ([place]) [F20.3] 09/08/2017    Priority: High  . Screening for cervical cancer [Z12.4] 07/29/2017  . 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 Gravida 1 [IMO0002] 10/26/2015  . Dentagra [K08.89] 10/26/2015  . Itch of skin [L29.9] 10/26/2015  . Sex counseling [Z70.9] 10/26/2015  . Excessive sweating [R61] 07/05/2015  . Acid reflux [K21.9] 06/29/2015  . Encounter for pre-employment examination [Z02.1] 06/29/2015  . Diabetes mellitus type 2, controlled, without complications ([place]) [E31.5] 06/29/2015  . Cardiac murmur [R01.1] 06/29/2015  . Arthritis of knee, degenerative [M17.10] 06/28/2015  . Stiffness of both knees [M25.661, M25.662] 06/22/2015  . Insomnia w/ sleep apnea [G47.00, G47.30] 03/21/2015  . Anxiety disorder [F41.9] 03/21/2015  . Hypertension [I10] 03/21/2015  . HLD (hyperlipidemia) [E78.5] 03/21/2015  . Paranoid schizophrenia ([place]) [F20.0] 03/21/2015  . Urge incontinence [N39.41] 03/21/2015  . Breathlessness on exertion [R06.81] 02/02/2014  . Awareness of heartbeats [R00.2] 02/02/2014  . Apnea, sleep [G47.30] 02/02/2014   Past Medical History:  Past Medical History:  Diagnosis Date  . Anxiety   . Diabetes mellitus   . Fibromyalgia   . GERD (gastroesophageal reflux disease)   . Heart murmur   . Herniated disc   . Hyperlipidemia   . Hypertension   . Lack of bladder control   . Lung tumor   . Rheumatoid arthritis ([place])   . Schizoaffective disorder ([place])   . Seizure [facility])    after brain surgery 2012. last seizure 2013!  [name] Sleep apnea     Past Surgical History:  Procedure Laterality Date  . BRAIN TUMOR EXCISION  2012  benign  . CARDIAC CATHETERIZATION  2008  . CESAREAN SECTION    . CYST REMOVAL HAND    . LUNG LOBECTOMY  1977   benign tumor  . TOTAL KNEE ARTHROPLASTY Left 01/28/2017   Procedure: TOTAL KNEE ARTHROPLASTY;  Surgeon: Corky Mull, MD;  Location: [facility] ORS;   Service: Orthopedics;  Laterality: Left;   Family History:  Family History  Problem Relation Age of Onset  . Heart disease Brother   . Depression Mother   . Heart attack Mother   . Stroke Mother   . Alcohol abuse Father   . Stroke Father   . Diabetes Sister   . Diabetes Sister   . Stomach cancer Sister   . Kidney disease Sister   . COPD Brother   . Lung cancer Brother   . Diabetes Brother    Social History:  Social History   Substance and Sexual Activity  Alcohol Use No  . Alcohol/week: 0.0 oz     Social History   Substance and Sexual Activity  Drug Use No    Social History   Socioeconomic History  . Marital status: Single    Spouse name: None  . Number of children: None  . Years of education: None  . Highest education level: None  Social Needs  . Financial resource strain: Not hard at all  . Food insecurity - worry: Never true  . Food insecurity - inability: Never true  . Transportation needs - medical: No  . Transportation needs - non-medical: No  Occupational History  . None  Tobacco Use  . Smoking status: Never Smoker  . Smokeless tobacco: Never Used  Substance and Sexual Activity  . Alcohol use: No    Alcohol/week: 0.0 oz  . Drug use: No  . Sexual activity: No  Other Topics Concern  . None  Social History Narrative  . None    Hospital Course:    Lauren [name] is a 67 year old female with a history of schizophrenia admitted for paranoia in the context of medication changes and treatment noncompliance. Psychotic symptoms have resolved. The patient accepted Haldol decanoate injection to improve compliance. She responded to Haldol very well in the past. There are no EPS. Zyprexa was added for mood component and to speed up her recovery.  #Psychosis, resolved -continue Zyprexa 20 mg nightly -continue 50 mg Haldol decanoate monthly injections, next dose on 10/14/2017  #Poor oral intake and vomiting, resolved   #Insomnia -continue Trazodone to  100 mg nightly  #HTN -continue Atenolol 100 mg, Lisinopril 20 mg and HCTZ 25 mg daily -continue ASA 81 mg daily  #DM -continue Metformin 500 mg BID -ADA diet, SSI, BS monitoring  #Hyperlipidemia -continue Crestor 40 mg daily  #Metabolic syndrome monitoring -Lipid panelandTSH are normalHgbA1C6.2 -EKG, QTc 466  #Disposition -discharge to home with her son -follow up with [facility]  Physical Findings: AIMS: Facial and Oral Movements Muscles of Facial Expression: None, normal Lips and Perioral Area: None, normal Jaw: None, normal Tongue: None, normal,Extremity Movements Upper (arms, wrists, hands, fingers): None, normal Lower (legs, knees, ankles, toes): None, normal, Trunk Movements Neck, shoulders, hips: None, normal, Overall Severity Severity of abnormal movements (highest score from questions above): None, normal Incapacitation due to abnormal movements: None, normal Patient's awareness of abnormal movements (rate only patient's report): No Awareness, Dental Status Current problems with teeth and/or dentures?: No Does patient usually wear dentures?: No  CIWA:    COWS:     Musculoskeletal: Strength & Muscle Tone: within normal  limits Gait & Station: broad based Patient leans: N/A  Psychiatric Specialty Exam: Physical Exam  Nursing note and vitals reviewed. Psychiatric: She has a normal mood and affect. Her speech is normal and behavior is normal. Thought content normal. Cognition and memory are normal. She expresses impulsivity.    Review of Systems  Gastrointestinal: Positive for heartburn.  Musculoskeletal: Positive for joint pain.  Neurological: Negative.   Psychiatric/Behavioral: Negative.   All other systems reviewed and are negative.   Blood pressure (!) 144/81, pulse 98, temperature 98.2 F (36.8 C), temperature source Oral, resp. rate 20, height 5\' 4"  (1.626 m), weight 108 kg (238 lb), SpO2 98 %.Body mass index is 40.85 kg/m.  General  Appearance: Casual  Eye Contact:  Good  Speech:  Clear and Coherent  Volume:  Normal  Mood:  Euthymic  Affect:  Appropriate  Thought Process:  Goal Directed and Descriptions of Associations: Intact  Orientation:  Full (Time, Place, and Person)  Thought Content:  WDL  Suicidal Thoughts:  No  Homicidal Thoughts:  No  Memory:  Immediate;   Fair Recent;   Fair Remote;   Fair  Judgement:  Impaired  Insight:  Present  Psychomotor Activity:  Normal  Concentration:  Concentration: Fair and Attention Span: Fair  Recall:  [employer] of Knowledge:  Fair  Language:  Fair  Akathisia:  No  Handed:  Right  AIMS (if indicated):     Assets:  Communication Skills Desire for Improvement Financial Resources/Insurance Housing Resilience Social Support Transportation  ADL's:  Intact  Cognition:  WNL  Sleep:  Number of Hours: 7.45     Have you used any form of tobacco in the last 30 days? (Cigarettes, Smokeless Tobacco, Cigars, and/or Pipes): No  Has this patient used any form of tobacco in the last 30 days? (Cigarettes, Smokeless Tobacco, Cigars, and/or Pipes) Yes, No  Blood Alcohol level:  Lab Results  Component Value Date   ETH <10 09/06/2017   ETH <10 24/23/5361    Metabolic Disorder Labs:  Lab Results  Component Value Date   HGBA1C 6.2 (H) 09/09/2017   MPG 131.24 09/09/2017   No results found for: PROLACTIN Lab Results  Component Value Date   CHOL 211 (H) 09/09/2017   TRIG 102 09/09/2017   HDL 38 (L) 09/09/2017   CHOLHDL 5.6 09/09/2017   VLDL 20 09/09/2017   LDLCALC 153 (H) 09/09/2017   LDLCALC 102 (H) 12/20/2016    See Psychiatric Specialty Exam and Suicide Risk Assessment completed by Attending Physician prior to discharge.  Discharge destination:  Home  Is patient on multiple antipsychotic therapies at discharge:  Yes,   Do you recommend tapering to monotherapy for antipsychotics?  Yes   Has Patient had three or more failed trials of antipsychotic monotherapy  by history:  No  Recommended Plan for Multiple Antipsychotic Therapies: Taper to monotherapy as described:  discontinue Zyprexa if patient stable  Discharge Instructions    Diet - low sodium heart healthy   Complete by:  As directed    Increase activity slowly   Complete by:  As directed      Allergies as of 09/19/2017      Reactions   Percocet [oxycodone-acetaminophen] Diarrhea, Nausea And Vomiting, Nausea Only   Tramadol Hcl Diarrhea, Nausea And Vomiting, Nausea Only   Vicodin [hydrocodone-acetaminophen] Diarrhea, Nausea And Vomiting, Nausea Only      Medication List    STOP taking these medications   haloperidol 5 MG tablet Commonly known as:  HALDOL   sertraline 50 MG tablet Commonly known as:  ZOLOFT     TAKE these medications     Indication  aspirin EC 81 MG tablet Take 81 mg by mouth daily before breakfast.  Indication:  Inflammation   atenolol 100 MG tablet Commonly known as:  TENORMIN TAKE 1 TABLET BY MOUTH  DAILY  Indication:  High Blood Pressure Disorder   haloperidol decanoate 100 MG/ML injection Commonly known as:  HALDOL DECANOATE Inject 0.5 mLs (50 mg total) into the muscle every 28 (twenty-eight) days. Start taking on:  10/14/2017  Indication:  Psychosis   ibuprofen 200 MG tablet Commonly known as:  ADVIL,MOTRIN Take 600 mg by mouth every 8 (eight) hours as needed (for knee pain.).  Indication:  Inflammation   lisinopril-hydrochlorothiazide 20-25 MG tablet Commonly known as:  PRINZIDE,ZESTORETIC TAKE 1 TABLET BY MOUTH  DAILY  Indication:  High Blood Pressure Disorder   Lotion Base Lotn Apply 1 application topically 4 (four) times daily as needed (for knee pain.). Two [employer] Essential Lotion (Aloe Vera, Almond Oil, and 6 natural anti-inflammatory essential oils; [place], [place], [place], [place], Peppermint and [place])  Indication:  pain   metFORMIN 500 MG tablet Commonly known as:  GLUCOPHAGE TAKE 1 TABLET BY  MOUTH TWICE DAILY WITH A MEAL  Indication:  Type 2 Diabetes   OLANZapine zydis 20 MG disintegrating tablet Commonly known as:  ZYPREXA Take 1 tablet (20 mg total) by mouth at bedtime.  Indication:  Schizophrenia   omeprazole 20 MG capsule Commonly known as:  PRILOSEC TAKE 1 CAPSULE BY MOUTH  DAILY  Indication:  Gastroesophageal Reflux Disease   rosuvastatin 40 MG tablet Commonly known as:  CRESTOR TAKE 1 TABLET BY MOUTH  DAILY  Indication:  High Amount of Fats in the Blood   traZODone 100 MG tablet Commonly known as:  DESYREL Take 1 tablet (100 mg total) by mouth at bedtime as needed for sleep. What changed:    medication strength  how much to take  Indication:  Trouble Sleeping   Vitamin C Chew Chew 1 tablet by mouth daily as needed (for immune system support).  Indication:  general health   ZOSTRIX EX Apply 1 application topically 4 (four) times daily as needed (for knee pain.).  Indication:  pain      Follow-up Information    [facility] Follow up on 09/25/2017.   Specialty:  Behavioral Health Why:  Your follow-up appointment will be with Dr. Posey Boyer on 09/25/17 at 2:30PM. Contact information: [place] [phone]          Follow-up recommendations:  Activity:  as tolerated Diet:  low sodium heart healthy ADA diet Other:  keep follow up appointments  Comments:    Signed: Orson Slick, MD 09/19/2017, 10:01 AM

## 2017-09-19 NOTE — Progress Notes (Signed)
Recreation Therapy Notes  INPATIENT RECREATION TR PLAN  Patient Details Name: Charlot Gouin MRN: [id number] DOB: [birth year] Today's Date: 09/19/2017  Rec Therapy Plan Is patient appropriate for Therapeutic Recreation?: Yes Treatment times per week: at least 3 Estimated Length of Stay: 5-7 days TR Treatment/Interventions: Group participation (Comment)  Discharge Criteria Pt will be discharged from therapy if:: Discharged Treatment plan/goals/alternatives discussed and agreed upon by:: Patient/family  Discharge Summary Short term goals set: Patient will attend and participate in Recreation Therapy Group Sessions x 5 days.  Short term goals met: Not met Progress toward goals comments: Groups attended Which groups?: Leisure education(Values) Reason goals not met: Patient spent most of her time in her room. Therapeutic equipment acquired: N/A Reason patient discharged from therapy: Discharge from hospital Pt/family agrees with progress & goals achieved: Yes Date patient discharged from therapy: 09/19/17   Madhavi Hamblen 09/19/2017, 3:05 PM

## 2017-09-19 NOTE — BHH Suicide Risk Assessment (Signed)
[facility] Discharge Suicide Risk Assessment   Principal Problem: Schizophrenia, undifferentiated ([place]) Discharge Diagnoses:  Patient Active Problem List   Diagnosis Date Noted  . Schizophrenia, undifferentiated ([place]) [F20.3] 09/08/2017    Priority: High  . Screening for cervical cancer [Z12.4] 07/29/2017  . 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 Gravida 1 [IMO0002] 10/26/2015  . Dentagra [K08.89] 10/26/2015  . Itch of skin [L29.9] 10/26/2015  . Sex counseling [Z70.9] 10/26/2015  . Excessive sweating [R61] 07/05/2015  . Acid reflux [K21.9] 06/29/2015  . Encounter for pre-employment examination [Z02.1] 06/29/2015  . Diabetes mellitus type 2, controlled, without complications ([place]) [K74.2] 06/29/2015  . Cardiac murmur [R01.1] 06/29/2015  . Arthritis of knee, degenerative [M17.10] 06/28/2015  . Stiffness of both knees [M25.661, M25.662] 06/22/2015  . Insomnia w/ sleep apnea [G47.00, G47.30] 03/21/2015  . Anxiety disorder [F41.9] 03/21/2015  . Hypertension [I10] 03/21/2015  . HLD (hyperlipidemia) [E78.5] 03/21/2015  . Paranoid schizophrenia ([place]) [F20.0] 03/21/2015  . Urge incontinence [N39.41] 03/21/2015  . Breathlessness on exertion [R06.81] 02/02/2014  . Awareness of heartbeats [R00.2] 02/02/2014  . Apnea, sleep [G47.30] 02/02/2014    Total Time spent with patient: 30 minutes  Musculoskeletal: Strength & Muscle Tone: within normal limits Gait & Station: normal Patient leans: N/A  Psychiatric Specialty Exam: Review of Systems  Gastrointestinal: Positive for heartburn.  Musculoskeletal: Positive for joint pain.  Neurological: Negative.   Psychiatric/Behavioral: Negative.   All other systems reviewed and are negative.   Blood pressure (!) 144/81, pulse 98, temperature 98.2 F (36.8 C), temperature source Oral, resp. rate 20, height 5\' 4"  (1.626 m), weight 108 kg (238 lb), SpO2 98 %.Body mass index is 40.85 kg/m.  General Appearance:  Casual  Eye Contact::  Good  Speech:  Clear and Coherent409  Volume:  Normal  Mood:  Euthymic  Affect:  Appropriate  Thought Process:  Goal Directed and Descriptions of Associations: Intact  Orientation:  Full (Time, Place, and Person)  Thought Content:  WDL  Suicidal Thoughts:  No  Homicidal Thoughts:  No  Memory:  Immediate;   Fair Recent;   Fair Remote;   Fair  Judgement:  Impaired  Insight:  Present  Psychomotor Activity:  Normal  Concentration:  Fair  Recall:  [employer] of [place]  Language: Fair  Akathisia:  No  Handed:  Right  AIMS (if indicated):     Assets:  Communication Skills Desire for Improvement Financial Resources/Insurance Housing Physical Health Resilience Social Support  Sleep:  Number of Hours: 7.45  Cognition: WNL  ADL's:  Intact   Mental Status Per Nursing Assessment::   On Admission:     Demographic Factors:  NA  Loss Factors: Decline in physical health  Historical Factors: Impulsivity  Risk Reduction Factors:   Sense of responsibility to family, Living with another person, especially a relative, Positive social support and Positive therapeutic relationship  Continued Clinical Symptoms:  Schizophrenia:   Paranoid or undifferentiated type  Cognitive Features That Contribute To Risk:  None    Suicide Risk:  Minimal: No identifiable suicidal ideation.  Patients presenting with no risk factors but with morbid ruminations; may be classified as minimal risk based on the severity of the depressive symptoms  Follow-up [place] Follow up on 09/25/2017.   Specialty:  Behavioral Health Why:  Your follow-up appointment will be with Dr. Posey Boyer on 09/25/17 at 2:30PM. Contact information: [place]  [place] [phone]          Plan Of Care/Follow-up recommendations:  Activity:  as tolerated Diet:  low sodium heart  healthy ADA diet Other:  keep follow up appointments  Orson Slick, MD 09/19/2017, 9:55 AM

## 2017-09-25 ENCOUNTER — Encounter: Payer: Self-pay | Admitting: Psychiatry

## 2017-09-25 ENCOUNTER — Other Ambulatory Visit: Payer: Self-pay

## 2017-09-25 ENCOUNTER — Ambulatory Visit: Payer: Medicare Other | Admitting: Psychiatry

## 2017-09-25 VITALS — BP 151/99 | HR 111 | Wt 255.4 lb

## 2017-09-25 DIAGNOSIS — F200 Paranoid schizophrenia: Secondary | ICD-10-CM | POA: Diagnosis not present

## 2017-09-25 MED ORDER — QUETIAPINE FUMARATE 25 MG PO TABS
25.0000 mg | ORAL_TABLET | Freq: Every day | ORAL | 1 refills | Status: DC
Start: 2017-09-25 — End: 2017-10-13

## 2017-09-25 MED ORDER — TRAZODONE HCL 100 MG PO TABS: 100.0000 mg | ORAL_TABLET | Freq: Every evening | ORAL | 1 refills | Status: DC | PRN

## 2017-09-25 MED ORDER — HALOPERIDOL DECANOATE 100 MG/ML IM SOLN: 50.0000 mg | INTRAMUSCULAR | 1 refills | Status: DC

## 2017-09-25 NOTE — Progress Notes (Signed)
[facility] OP Progress Note  09/25/2017 12:34 PM Lauren [name]  MRN:  [id number]  Chief Complaint: ' I am still hearing voices.'  Chief Complaint    Follow-up; Medication Refill; Medication Problem     HPI: Lauren [name] is a 67 year old African-American female with history of paranoid schizophrenia, presented to the clinic today for a follow-up visit.    Lauren [name] used to follow up with Dr. Einar Grad in the past.  Lauren [name] was seen by Probation officer for a crisis on 07/23/2017.  Thereafter Lauren [name] was admitted to the inpatient mental health unit at [facility]. She was discharged few  days ago from the inpatient unit( 09/08/2017 - 09/19/2017).  Patient today reports that she stopped taking the olanzapine that she was discharged on from the mental inpatient unit   .  She reports that when she went to the pharmacy she realized that it is going to cost her almost $90 and she decided not to fill it.  She reports that she cannot afford a medication that cost her that much every month.  She continues to take the trazodone at night.  She however reports her sleep as disrupted.  Patient continues to hear voices.  She reports they are not outside her head but these are "subconscious voices that she hears inside."She reports the voices are constant and they can be negative or positive.  She also reports she feels overwhelmed at times.  She does not think it is anxiety but more like she is stressed out.  She reports she is okay going back to the Seroquel which she used to take in the past.  She reports she is okay with a low dose of Seroquel.  Also discussed with her that the Haldol decanoate can be increased if she tolerates it well.  She agrees with plan.  Discussed with patient to call writer if she develops side effects to any of her medication.  Discussed with her the importance of staying compliant on her medications.    Visit Diagnosis:    ICD-10-CM   1. Schizophrenia, paranoid ([place]) F20.0  traZODone (DESYREL) 100 MG tablet    haloperidol decanoate (HALDOL DECANOATE) 100 MG/ML injection    QUEtiapine (SEROQUEL) 25 MG tablet    Past Psychiatric History: History of schizophrenia, depression and anxiety since the past several years.  She reports a history of suicide attempt in the past by overdose.  This was a long time ago likely 1984.  She reports 3-4 inpatient mental health admissions in the past. Most recent admission at [facility] IP unit ( 21/31/2018 - 09/19/2017)  She used to  follow-up with Dr. Einar Grad in clinic in the past.  Past Medical History:  Past Medical History:  Diagnosis Date  . Anxiety   . Diabetes mellitus   . Fibromyalgia   . GERD (gastroesophageal reflux disease)   . Heart murmur   . Herniated disc   . Hyperlipidemia   . Hypertension   . Lack of bladder control   . Lung tumor   . Rheumatoid arthritis ([place])   . Schizoaffective disorder ([place])   . Seizure [facility])    after brain surgery 2012. last seizure 2013!  [name] Sleep apnea     Past Surgical History:  Procedure Laterality Date  . BRAIN TUMOR EXCISION  2012   benign  . CARDIAC CATHETERIZATION  2008  . CESAREAN SECTION    . CYST REMOVAL HAND    . LUNG LOBECTOMY  1977   benign tumor  .  TOTAL KNEE ARTHROPLASTY Left 01/28/2017   Procedure: TOTAL KNEE ARTHROPLASTY;  Surgeon: Corky Mull, MD;  Location: [facility] ORS;  Service: Orthopedics;  Laterality: Left;    Family Psychiatric History: Mother- depression, Father-alcohol abuse, son-mental health problems.  Denies suicide in her family.  Family History:  Family History  Problem Relation Age of Onset  . Heart disease Brother   . Depression Mother   . Heart attack Mother   . Stroke Mother   . Alcohol abuse Father   . Stroke Father   . Diabetes Sister   . Diabetes Sister   . Stomach cancer Sister   . Kidney disease Sister   . COPD Brother   . Lung cancer Brother   . Diabetes Brother     Social History: She is single.  She lives in [place].  She  reports she has a lot of family in the area.  She is on SSD.  She also works part-time with [employer]. Social History   Socioeconomic History  . Marital status: Single    Spouse name: None  . Number of children: None  . Years of education: None  . Highest education level: None  Social Needs  . Financial resource strain: Not hard at all  . Food insecurity - worry: Never true  . Food insecurity - inability: Never true  . Transportation needs - medical: No  . Transportation needs - non-medical: No  Occupational History  . None  Tobacco Use  . Smoking status: Never Smoker  . Smokeless tobacco: Never Used  Substance and Sexual Activity  . Alcohol use: No    Alcohol/week: 0.0 oz  . Drug use: No  . Sexual activity: No  Other Topics Concern  . None  Social History Narrative  . None    Allergies:  Allergies  Allergen Reactions  . Percocet [Oxycodone-Acetaminophen] Diarrhea, Nausea And Vomiting and Nausea Only  . Tramadol Hcl Diarrhea, Nausea And Vomiting and Nausea Only  . Vicodin [Hydrocodone-Acetaminophen] Diarrhea, Nausea And Vomiting and Nausea Only    Metabolic Disorder Labs: Lab Results  Component Value Date   HGBA1C 6.2 (H) 09/09/2017   MPG 131.24 09/09/2017   No results found for: PROLACTIN Lab Results  Component Value Date   CHOL 211 (H) 09/09/2017   TRIG 102 09/09/2017   HDL 38 (L) 09/09/2017   CHOLHDL 5.6 09/09/2017   VLDL 20 09/09/2017   LDLCALC 153 (H) 09/09/2017   LDLCALC 102 (H) 12/20/2016   Lab Results  Component Value Date   TSH 1.426 09/09/2017   TSH 0.674 07/05/2015    Therapeutic Level Labs: No results found for: LITHIUM No results found for: VALPROATE No components found for:  CBMZ  Current Medications: Current Outpatient Medications  Medication Sig Dispense Refill  . aspirin EC 81 MG tablet Take 81 mg by mouth daily before breakfast.     . atenolol (TENORMIN) 100 MG tablet TAKE 1 TABLET BY MOUTH  DAILY 90 tablet 0  .  Bioflavonoid Products (VITAMIN C) CHEW Chew 1 tablet by mouth daily as needed (for immune system support).    . Capsaicin (ZOSTRIX EX) Apply 1 application topically 4 (four) times daily as needed (for knee pain.).    Derrill Memo ON 10/14/2017] haloperidol decanoate (HALDOL DECANOATE) 100 MG/ML injection Inject 0.5 mLs (50 mg total) into the muscle every 28 (twenty-eight) days. 1 mL 1  . ibuprofen (ADVIL,MOTRIN) 200 MG tablet Take 600 mg by mouth every 8 (eight) hours as needed (for  knee pain.).    [name] lisinopril-hydrochlorothiazide (PRINZIDE,ZESTORETIC) 20-25 MG tablet TAKE 1 TABLET BY MOUTH  DAILY 90 tablet 0  . Lotion Base LOTN Apply 1 application topically 4 (four) times daily as needed (for knee pain.). [employer] Essential Lotion (Aloe Vera, [employer], and 6 natural anti-inflammatory essential oils; [place], [place], [place], [place], Peppermint and [place])    . metFORMIN (GLUCOPHAGE) 500 MG tablet TAKE 1 TABLET BY MOUTH TWICE DAILY WITH A MEAL 60 tablet 0  . omeprazole (PRILOSEC) 20 MG capsule TAKE 1 CAPSULE BY MOUTH  DAILY 90 capsule 0  . rosuvastatin (CRESTOR) 40 MG tablet TAKE 1 TABLET BY MOUTH  DAILY 90 tablet 0  . traZODone (DESYREL) 100 MG tablet Take 1 tablet (100 mg total) by mouth at bedtime as needed for sleep. 30 tablet 1  . QUEtiapine (SEROQUEL) 25 MG tablet Take 1 tablet (25 mg total) by mouth at bedtime. 30 tablet 1   No current facility-administered medications for this visit.      Musculoskeletal: Strength & Muscle Tone: within normal limits Gait & Station: normal Patient leans: N/A  Psychiatric Specialty Exam: Review of Systems  Psychiatric/Behavioral: Positive for hallucinations. The patient is nervous/anxious and has insomnia.   All other systems reviewed and are negative.   Blood pressure (!) 151/99, pulse (!) 111, weight 255 lb 6.4 oz (115.8 kg).Body mass index is 43.84 kg/m.  General Appearance: Casual  Eye Contact:  Fair  Speech:   Normal Rate  Volume:  Normal  Mood:  Anxious  Affect:  Congruent  Thought Process:  Goal Directed and Descriptions of Associations: Intact  Orientation:  Full (Time, Place, and Person)  Thought Content: reports some "subconscious voices"as per her , they talk to her all the time , positive and negative   Suicidal Thoughts:  No  Homicidal Thoughts:  No  Memory:  Immediate;   Fair Recent;   Fair Remote;   Fair  Judgement:  Fair  Insight:  Fair  Psychomotor Activity:  Normal  Concentration:  Concentration: Fair and Attention Span: Fair  Recall:  [employer] of Knowledge: Fair  Language: Fair  Akathisia:  No  Handed:  Right  AIMS (if indicated): 0  Assets:  Communication Skills Desire for Improvement Housing Social Support  ADL's:  Intact  Cognition: WNL  Sleep:  restless   Screenings: AIMS     Office Visit from 09/25/2017 in [place] Admission (Discharged) from 09/08/2017 in [place] Total Score  0  0    AUDIT     Admission (Discharged) from 09/08/2017 in [place]  Alcohol Use Disorder Identification Test Final Score (AUDIT)  0    PHQ2-9     Office Visit from 07/29/2017 in [facility] Office Visit from 07/21/2017 in [place] from 05/26/2017 in [facility] Office Visit from 02/18/2017 in [facility] Office Visit from 01/07/2017 in [facility]  PHQ-2 Total Score  1  2  0  0  0  PHQ-9 Total Score  5  4  No data  No data  No data       Assessment and Plan: Seylah presented today after recently being discharged from the inpatient mental health unit [facility]) .  Soon after she got discharged she  she stopped taking her Zyprexa due to the cost of the medication.  She  continues to have psychosis as well as some sleep issues.  She agrees to getting the  Haldol decanoate IM , will be due for it next month.  Patient does have a history of noncompliance with medications.  Will continue to educate patient. Discussed plan as noted below.  Plan Paranoid schizophrenia Continue Haldol Decanoate 50 mg IM every 30 days-  next dose due 10/14/2017.   Provided with prescription to bring her Haldol decanoate medication on 10/14/2017 AIMS - 0 She stopped taking the Zyprexa due to the cost. Discussed restarting Seroquel at a low dose, she agrees with plan.  Start Seroquel 25 mg p.o. nightly  For insomnia Seroquel 25 mg p.o. nightly Continue trazodone 100 mg p.o. nightly as needed.  Follow-up on 10/14/2017 for her monthly injection as well as medication management.  More than 50 % of the time was spent for psychoeducation and supportive psychotherapy and care coordination. This note was generated in part or whole with voice recognition software. Voice recognition is usually quite accurate but there are transcription errors that can and very often do occur. I apologize for any typographical errors that were not detected and corrected.       Lauren Alert, MD 09/26/2017, 12:34 PM

## 2017-09-26 ENCOUNTER — Encounter: Payer: Self-pay | Admitting: Psychiatry

## 2017-10-01 ENCOUNTER — Ambulatory Visit: Payer: Self-pay | Admitting: Family Medicine

## 2017-10-13 ENCOUNTER — Other Ambulatory Visit: Payer: Self-pay

## 2017-10-13 ENCOUNTER — Encounter: Payer: Self-pay | Admitting: Psychiatry

## 2017-10-13 ENCOUNTER — Ambulatory Visit (INDEPENDENT_AMBULATORY_CARE_PROVIDER_SITE_OTHER): Payer: Medicare Other | Admitting: Psychiatry

## 2017-10-13 ENCOUNTER — Telehealth: Payer: Self-pay

## 2017-10-13 VITALS — BP 150/85 | HR 70 | Temp 97.5°F

## 2017-10-13 DIAGNOSIS — F200 Paranoid schizophrenia: Secondary | ICD-10-CM | POA: Diagnosis not present

## 2017-10-13 MED ORDER — BENZTROPINE MESYLATE 0.5 MG PO TABS: 0.5000 mg | ORAL_TABLET | Freq: Every day | ORAL | 1 refills | Status: DC

## 2017-10-13 MED ORDER — QUETIAPINE FUMARATE 25 MG PO TABS: 25.0000 mg | ORAL_TABLET | Freq: Every evening | ORAL | 0 refills | Status: DC | PRN

## 2017-10-13 MED ORDER — HALOPERIDOL 10 MG PO TABS: 10.0000 mg | ORAL_TABLET | Freq: Every day | ORAL | 1 refills | Status: DC

## 2017-10-13 MED ORDER — QUETIAPINE FUMARATE 25 MG PO TABS: 25.0000 mg | ORAL_TABLET | Freq: Every evening | ORAL | 1 refills | Status: DC | PRN

## 2017-10-13 MED ORDER — BENZTROPINE MESYLATE 0.5 MG PO TABS: 0.5000 mg | ORAL_TABLET | Freq: Every day | ORAL | 0 refills | Status: DC

## 2017-10-13 MED ORDER — HALOPERIDOL 10 MG PO TABS: 10.0000 mg | ORAL_TABLET | Freq: Every day | ORAL | 0 refills | Status: DC

## 2017-10-13 NOTE — Telephone Encounter (Signed)
Went with pt up front we got her phone number updated and we also got her to sign release to speak with her sister.  I will call her tomorrow and see how she is doing.

## 2017-10-13 NOTE — Telephone Encounter (Signed)
pt came in states she needs a increase on her quetiapine 25mg  pt staqtes she needs to take two.  pt states she not sleeping. pt did have an appt tomorrow but she wants to cancel appt.

## 2017-10-13 NOTE — Telephone Encounter (Signed)
I met with patient today.

## 2017-10-13 NOTE — Progress Notes (Signed)
Charles Mix MD  OP Progress Note  10/13/2017 12:28 PM Shamina Etheridge  MRN:  [id number]  Chief Complaint: ' I am not taking the injection."  Chief Complaint    Medication Refill; Follow-up     HPI: Lauren [name] is a 67 year old African-American female with a history of paranoid schizophrenia, presented to the clinic today as a walk-in.  Patient initially presented asking for an increase of her Seroquel dosage to 50 mg as well as for a refill.  Patient was seen initially by CMA Jessica.  She reportedly told Janett Billow CMA that she did not want to come to the clinic again tomorrow for her scheduled appointment with writer/IM LAI monthly and that she did not want to take the injections.  Patient later on agreed to be seen by writer for a follow-up today.  Patient appeared to be very paranoid and mildly agitated when Probation officer met with patient.  Patient reported that she did not want the Haldol Decanoate injection to be continued.  Writer attempted to provide medication education as well as tried to discuss the difference between the Haldol pill and the Haldol injection.  Patient became obsessed with the fact that she was only taking Haldol 5 mg PO in the past whereas her Haldol Decanoate injection was 50 mg.  Patient also became very suspicious and agitated claiming that she was told by the inpatient providers that she was given Haldol Decanoate 10 mg and not 50 mg.  During the session patient became kind of restless and wanted writer to show her medical records as well as the medication order in EHR and hence got up from her seat and came over to Probation officer.  Patient had to be redirected.  Patient did follow redirection and was able to sit down for the rest of the session.  Janett Billow CMA was called in to assist and also helped with the evaluation.  Patient denied any perceptual disturbances but clearly appeared to be paranoid and delusional.  Patient however agreed to be restarted on Haldol pill.  Discussed with patient  that she can be restarted on Haldol p.o.  Discussed with her she will be started on 10 mg Haldol at bedtime.  Discussed with her about the side effects of Haldol including EPS.  Patient reports she has a history of having tremors in the past.  But she agrees to take Cogentin along with it.  Patient reports a history of sleeping very good when she takes Haldol pills.  She also reports Seroquel at a high dose of 50 mg also helps her with her sleep.  Discussed with patient that since Haldol pills may help her with her sleep, her Seroquel can be changed to 25 mg as needed.  Discussed with her about the side effects of medications like Haldol and Seroquel when combined as well as the risk of orthostatic hypotension.  Patient does have a history of feeling dizzy in the past due to medications.  So discussed with her not to take the trazodone anymore since she is currently on Haldol pill as well as Seroquel at bedtime.  Patient agreed with plan.  Also discussed with patient to sign a release to communicate with her family member.  Patient reported that she does not want anyone to contact her son and reported that it is okay for [place] to communicate her sister Marcial Pacas at [phone].'  Visit Diagnosis:    ICD-10-CM   1. Schizophrenia, paranoid ([place]) F20.0 benztropine (COGENTIN) 0.5 MG tablet  haloperidol (HALDOL) 10 MG tablet    QUEtiapine (SEROQUEL) 25 MG tablet    DISCONTINUED: QUEtiapine (SEROQUEL) 25 MG tablet    DISCONTINUED: haloperidol (HALDOL) 10 MG tablet    DISCONTINUED: benztropine (COGENTIN) 0.5 MG tablet    Past Psychiatric History: History of schizophrenia, depression and anxiety since the past several years.  She reports a history of suicide attempt in the past by overdose.  This was a long time ago likely 1984.  She reports 3-4 inpatient mental health admissions in the past.  Most recent admission at [facility] inpatient unit-09/08/2017 to 09/19/2017.  She used to follow up with Dr. Einar Grad in  clinic in the past.  Past Medical History:  Past Medical History:  Diagnosis Date  . Anxiety   . Diabetes mellitus   . Fibromyalgia   . GERD (gastroesophageal reflux disease)   . Heart murmur   . Herniated disc   . Hyperlipidemia   . Hypertension   . Lack of bladder control   . Lung tumor   . Rheumatoid arthritis ([place])   . Schizoaffective disorder ([place])   . Seizure [facility])    after brain surgery 2012. last seizure 2013!  [name] Sleep apnea     Past Surgical History:  Procedure Laterality Date  . BRAIN TUMOR EXCISION  2012   benign  . CARDIAC CATHETERIZATION  2008  . CESAREAN SECTION    . CYST REMOVAL HAND    . LUNG LOBECTOMY  1977   benign tumor  . TOTAL KNEE ARTHROPLASTY Left 01/28/2017   Procedure: TOTAL KNEE ARTHROPLASTY;  Surgeon: Corky Mull, MD;  Location: [facility] ORS;  Service: Orthopedics;  Laterality: Left;    Family Psychiatric History: Mother-depression, Father-alcohol abuse, son-mental health problems.  Denies suicide in her family.  Family History:  Family History  Problem Relation Age of Onset  . Heart disease Brother   . Depression Mother   . Heart attack Mother   . Stroke Mother   . Alcohol abuse Father   . Stroke Father   . Diabetes Sister   . Diabetes Sister   . Stomach cancer Sister   . Kidney disease Sister   . COPD Brother   . Lung cancer Brother   . Diabetes Brother    Substance abuse history: Denies   Social History: She is single.  She lives in [place].  She reports she has a lot of family in the area.  She is on SSD.  She used to work part-time with [employer].  Reports her sister is supportive.  She also has a son who lives in the area. Social History   Socioeconomic History  . Marital status: Single    Spouse name: None  . Number of children: None  . Years of education: None  . Highest education level: None  Social Needs  . Financial resource strain: Not hard at all  . Food insecurity - worry: Never true  . Food  insecurity - inability: Never true  . Transportation needs - medical: No  . Transportation needs - non-medical: No  Occupational History  . None  Tobacco Use  . Smoking status: Never Smoker  . Smokeless tobacco: Never Used  Substance and Sexual Activity  . Alcohol use: No    Alcohol/week: 0.0 oz  . Drug use: No  . Sexual activity: No  Other Topics Concern  . None  Social History Narrative  . None    Allergies:  Allergies  Allergen Reactions  . Percocet [  Oxycodone-Acetaminophen] Diarrhea, Nausea And Vomiting and Nausea Only  . Tramadol Hcl Diarrhea, Nausea And Vomiting and Nausea Only  . Vicodin [Hydrocodone-Acetaminophen] Diarrhea, Nausea And Vomiting and Nausea Only    Metabolic Disorder Labs: Lab Results  Component Value Date   HGBA1C 6.2 (H) 09/09/2017   MPG 131.24 09/09/2017   No results found for: PROLACTIN Lab Results  Component Value Date   CHOL 211 (H) 09/09/2017   TRIG 102 09/09/2017   HDL 38 (L) 09/09/2017   CHOLHDL 5.6 09/09/2017   VLDL 20 09/09/2017   LDLCALC 153 (H) 09/09/2017   LDLCALC 102 (H) 12/20/2016   Lab Results  Component Value Date   TSH 1.426 09/09/2017   TSH 0.674 07/05/2015    Therapeutic Level Labs: No results found for: LITHIUM No results found for: VALPROATE No components found for:  CBMZ  Current Medications: Current Outpatient Medications  Medication Sig Dispense Refill  . aspirin EC 81 MG tablet Take 81 mg by mouth daily before breakfast.     . atenolol (TENORMIN) 100 MG tablet TAKE 1 TABLET BY MOUTH  DAILY 90 tablet 0  . Bioflavonoid Products (VITAMIN C) CHEW Chew 1 tablet by mouth daily as needed (for immune system support).    . Capsaicin (ZOSTRIX EX) Apply 1 application topically 4 (four) times daily as needed (for knee pain.).    [name] ibuprofen (ADVIL,MOTRIN) 200 MG tablet Take 600 mg by mouth every 8 (eight) hours as needed (for knee pain.).    [name] lisinopril-hydrochlorothiazide (PRINZIDE,ZESTORETIC) 20-25 MG tablet TAKE 1  TABLET BY MOUTH  DAILY 90 tablet 0  . Lotion Base LOTN Apply 1 application topically 4 (four) times daily as needed (for knee pain.). Two Old Goats Essential Lotion (Aloe Vera, [employer], and 6 natural anti-inflammatory essential oils; [place] Chamomile, [place], [place], [place], Peppermint and [place])    . metFORMIN (GLUCOPHAGE) 500 MG tablet TAKE 1 TABLET BY MOUTH TWICE DAILY WITH A MEAL 60 tablet 0  . omeprazole (PRILOSEC) 20 MG capsule TAKE 1 CAPSULE BY MOUTH  DAILY 90 capsule 0  . QUEtiapine (SEROQUEL) 25 MG tablet Take 1 tablet (25 mg total) by mouth at bedtime as needed. 90 tablet 1  . rosuvastatin (CRESTOR) 40 MG tablet TAKE 1 TABLET BY MOUTH  DAILY 90 tablet 0  . benztropine (COGENTIN) 0.5 MG tablet Take 1 tablet (0.5 mg total) by mouth at bedtime. 90 tablet 1  . haloperidol (HALDOL) 10 MG tablet Take 1 tablet (10 mg total) by mouth at bedtime. 90 tablet 1   No current facility-administered medications for this visit.      Musculoskeletal: Strength & Muscle Tone: within normal limits Gait & Station: normal Patient leans: N/A  Psychiatric Specialty Exam: Review of Systems  Psychiatric/Behavioral: The patient is nervous/anxious.   All other systems reviewed and are negative.   Blood pressure (!) 150/85, pulse 70, temperature (!) 97.5 F (36.4 C), temperature source Oral.There is no height or weight on file to calculate BMI.  General Appearance: Casual  Eye Contact:  Fair  Speech:  Clear and Coherent  Volume:  Increased  Mood:  Anxious and Irritable  Affect:  Labile  Thought Process:  Goal Directed and Descriptions of Associations: Circumstantial  Orientation:  Other:  oriented to person, place and situation  Thought Content: Delusions and Paranoid Ideation , but her delusions and paranoia are chronic.  Suicidal Thoughts:  No  Homicidal Thoughts:  No  Memory:  Immediate;   Fair Recent;   Fair Remote;  Fair  Judgement:  Fair  Insight:  Shallow   Psychomotor Activity:  Restlessness  Concentration:  Concentration: Fair and Attention Span: Fair  Recall:  [employer] of Knowledge: Fair  Language: Fair  Akathisia:  No  Handed:  Right  AIMS (if indicated): 0  Assets:  Communication Skills Desire for Improvement Intimacy Social Support  ADL's:  Intact  Cognition: WNL  Sleep:  Fair   Screenings: AIMS     Office Visit from 09/25/2017 in [facility] [place] Admission (Discharged) from 09/08/2017 in [place] Total Score  0  0    AUDIT     Admission (Discharged) from 09/08/2017 in [place]  Alcohol Use Disorder Identification Test Final Score (AUDIT)  0    PHQ2-9     Office Visit from 07/29/2017 in [facility] Office Visit from 07/21/2017 in [place] from 05/26/2017 in [facility] Office Visit from 02/18/2017 in [facility] Office Visit from 01/07/2017 in [facility]  PHQ-2 Total Score  1  2  0  0  0  PHQ-9 Total Score  5  4  No data  No data  No data       Assessment and Plan: Tierra presented today as a walk-in requesting a medication refill.  Patient was initially seen by [facility] CMA.  Patient also wanted to cancel her appointment for tomorrow for a follow-up with writer as well as to receive her monthly Haldol Decanoate injection IM.  Patient appeared to be paranoid and mildly agitated.  Patient reported that the Haldol decanoate  was not helping her and she did not want to continue the same.  The patient however wanted to be back on Haldol pill if possible.  Attempted to provide medication education however patient remained delusional.  Patient however does have a chronic history of paranoia.  She however at this time does not appear to be in acute need for inpatient mental health admission.  She agrees to stay compliant on her Haldol pill.   She also provided information about her sister who is a social support for her.  Discussed medication changes with patient.  Plan as noted below.  Plan Paranoid schizophrenia Discontinue Haldol Decanoate 50 mg, next dose due  10/14/2017.  Patient reports she does not want to be on it anymore. Restart Haldol p.o.  We will restart her on Haldol 10 mg p.o. nightly. Start Cogentin 0.5 mg p.o. nightly for EPS. Change Seroquel to 25 mg p.o. nightly as needed for insomnia/agitation, anxiety.  Insomnia Seroquel 25 mg p.o. nightly as needed. Patient reports that the Haldol pill has always helped her to sleep in the past. Discontinue trazodone.  Patient was provided Haldol, Seroquel as well as Cogentin for 10 days-sent to her [employer].  Also provided Haldol ,Seroquel and Cogentin 90-day supply sent to [facility].  Follow-up in 1 month or sooner if needed.  More than 50 % of the time was spent for psychoeducation and supportive psychotherapy and care coordination.  This note was generated in part or whole with voice recognition software. Voice recognition is usually quite accurate but there are transcription errors that can and very often do occur. I apologize for any typographical errors that were not detected and corrected.       Ursula Alert, MD 10/13/2017, 12:28 PM

## 2017-10-14 ENCOUNTER — Ambulatory Visit: Payer: Medicare Other

## 2017-10-14 ENCOUNTER — Ambulatory Visit: Payer: Medicare Other | Admitting: Psychiatry

## 2017-10-20 NOTE — Telephone Encounter (Signed)
I have tried several times to call pt. But it just states that voicemail has not been set up and says goodbye and hangs up.

## 2017-11-03 ENCOUNTER — Other Ambulatory Visit: Payer: Self-pay

## 2017-11-03 ENCOUNTER — Emergency Department
Admission: EM | Admit: 2017-11-03 | Discharge: 2017-11-04 | Disposition: A | Discharge status: Other Acute Inpatient Hospital | Payer: Medicare Other | Attending: Emergency Medicine | Admitting: Emergency Medicine

## 2017-11-03 DIAGNOSIS — K219 Gastro-esophageal reflux disease without esophagitis: Secondary | ICD-10-CM | POA: Diagnosis present

## 2017-11-03 DIAGNOSIS — F259 Schizoaffective disorder, unspecified: Secondary | ICD-10-CM

## 2017-11-03 DIAGNOSIS — I10 Essential (primary) hypertension: Secondary | ICD-10-CM | POA: Diagnosis present

## 2017-11-03 DIAGNOSIS — E1169 Type 2 diabetes mellitus with other specified complication: Secondary | ICD-10-CM

## 2017-11-03 DIAGNOSIS — Z79899 Other long term (current) drug therapy: Secondary | ICD-10-CM | POA: Insufficient documentation

## 2017-11-03 DIAGNOSIS — M179 Osteoarthritis of knee, unspecified: Secondary | ICD-10-CM | POA: Diagnosis present

## 2017-11-03 DIAGNOSIS — Z7984 Long term (current) use of oral hypoglycemic drugs: Secondary | ICD-10-CM | POA: Insufficient documentation

## 2017-11-03 DIAGNOSIS — M1710 Unilateral primary osteoarthritis, unspecified knee: Secondary | ICD-10-CM | POA: Diagnosis present

## 2017-11-03 DIAGNOSIS — E119 Type 2 diabetes mellitus without complications: Secondary | ICD-10-CM | POA: Insufficient documentation

## 2017-11-03 DIAGNOSIS — Z96652 Presence of left artificial knee joint: Secondary | ICD-10-CM | POA: Insufficient documentation

## 2017-11-03 DIAGNOSIS — E871 Hypo-osmolality and hyponatremia: Secondary | ICD-10-CM | POA: Insufficient documentation

## 2017-11-03 LAB — COMPREHENSIVE METABOLIC PANEL
ALT: 56 U/L — ABNORMAL HIGH (ref 14–54)
AST: 53 U/L — ABNORMAL HIGH (ref 15–41)
Albumin: 4.5 g/dL (ref 3.5–5.0)
Alkaline Phosphatase: 60 U/L (ref 38–126)
Anion gap: 12 (ref 5–15)
BUN: 13 mg/dL (ref 6–20)
CO2: 23 mmol/L (ref 22–32)
Calcium: 9.5 mg/dL (ref 8.9–10.3)
Chloride: 101 mmol/L (ref 101–111)
Creatinine, Ser: 0.73 mg/dL (ref 0.44–1.00)
GFR calc Af Amer: >60 mL/min (ref >60)
GFR calc non Af Amer: >60 mL/min (ref >60)
Glucose, Bld: 181 mg/dL — ABNORMAL HIGH (ref 65–99)
Potassium: 2.7 mmol/L — CL (ref 3.5–5.1)
Sodium: 136 mmol/L (ref 135–145)
Total Bilirubin: 1.7 mg/dL — ABNORMAL HIGH (ref 0.3–1.2)
Total Protein: 8 g/dL (ref 6.5–8.1)

## 2017-11-03 LAB — CBC
HCT: 41.6 % (ref 35.0–47.0)
Hemoglobin: 14 g/dL (ref 12.0–16.0)
MCH: 31.7 pg (ref 26.0–34.0)
MCHC: 33.8 g/dL (ref 32.0–36.0)
MCV: 93.8 fL (ref 80.0–100.0)
Platelets: 184 10*3/uL (ref 150–440)
RBC: 4.43 MIL/uL (ref 3.80–5.20)
RDW: 15.4 % — ABNORMAL HIGH (ref 11.5–14.5)
WBC: 3.8 10*3/uL (ref 3.6–11.0)

## 2017-11-03 LAB — MAGNESIUM: Magnesium: 1.5 mg/dL — ABNORMAL LOW (ref 1.7–2.4)

## 2017-11-03 LAB — ACETAMINOPHEN LEVEL: Acetaminophen (Tylenol), Serum: <10 ug/mL — ABNORMAL LOW (ref 10–30)

## 2017-11-03 LAB — SALICYLATE LEVEL: Salicylate Lvl: <7 mg/dL (ref 2.8–30.0)

## 2017-11-03 LAB — ETHANOL: Alcohol, Ethyl (B): <10 mg/dL (ref <10)

## 2017-11-03 MED ORDER — POTASSIUM CHLORIDE 20 MEQ/15ML (10%) PO SOLN
40.0000 meq | Freq: Once | ORAL | Status: AC
  Administered 2017-11-03: 40 meq via ORAL
  Filled 2017-11-03: qty 30

## 2017-11-03 MED ORDER — ATENOLOL 25 MG PO TABS
100.0000 mg | ORAL_TABLET | Freq: Every day | ORAL | Status: DC
  Administered 2017-11-04: 100 mg via ORAL
  Filled 2017-11-03: qty 4

## 2017-11-03 MED ORDER — ASPIRIN EC 81 MG PO TBEC
81.0000 mg | DELAYED_RELEASE_TABLET | Freq: Every day | ORAL | Status: DC
  Administered 2017-11-04: 81 mg via ORAL
  Filled 2017-11-03: qty 1

## 2017-11-03 MED ORDER — BENZTROPINE MESYLATE 1 MG PO TABS
0.5000 mg | ORAL_TABLET | Freq: Every day | ORAL | Status: DC
  Administered 2017-11-03: 0.5 mg via ORAL
  Filled 2017-11-03: qty 1

## 2017-11-03 MED ORDER — MAGNESIUM SULFATE IN D5W 1-5 GM/100ML-% IV SOLN
1.0000 g | Freq: Once | INTRAVENOUS | Status: AC
  Administered 2017-11-03: 1 g via INTRAVENOUS
  Filled 2017-11-03: qty 100

## 2017-11-03 MED ORDER — METFORMIN HCL 500 MG PO TABS
500.0000 mg | ORAL_TABLET | Freq: Two times a day (BID) | ORAL | Status: DC
  Administered 2017-11-04: 500 mg via ORAL
  Filled 2017-11-03: qty 1
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
## 2017-11-03 NOTE — ED Notes (Signed)
[place] computer in the room.
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
## 2017-11-03 NOTE — ED Notes (Signed)
Pt moved to room 26.  Pt dressed out in burgundy scrubs.  Pts belongings bagged, labelled and placed at nurses station.  Belongings include a gray purse, green sweater, bra, pants, panties, shoes and a black hair net.  Pt had 669.29 cash and a wallet with DL, 2 cards, insurance cards in it, given to security to be locked up.  Sonja and myself counted money along with pt.  Pts keys are in her purse

## 2017-11-03 NOTE — ED Notes (Signed)
Officer took [phone] and a wallet to the safe. Patient counted the money with [name] ED tech and this Probation officer. Wallet had driver's license and two credit cards and health cards. Patient's purse with her keys and her clothing were locked up per unit's policy. Patient has glasses on at this time.

## 2017-11-04 ENCOUNTER — Inpatient Hospital Stay
Admission: AD | Admit: 2017-11-04 | Discharge: 2017-11-18 | DRG: 885 | Disposition: A | Discharge status: Home / Self Care | Payer: Medicare Other | Attending: Psychiatry | Admitting: Psychiatry

## 2017-11-04 ENCOUNTER — Other Ambulatory Visit: Payer: Self-pay

## 2017-11-04 DIAGNOSIS — E876 Hypokalemia: Secondary | ICD-10-CM | POA: Diagnosis present

## 2017-11-04 DIAGNOSIS — T434X6A Underdosing of butyrophenone and thiothixene neuroleptics, initial encounter: Secondary | ICD-10-CM | POA: Diagnosis present

## 2017-11-04 DIAGNOSIS — R001 Bradycardia, unspecified: Secondary | ICD-10-CM | POA: Diagnosis not present

## 2017-11-04 DIAGNOSIS — E7800 Pure hypercholesterolemia, unspecified: Secondary | ICD-10-CM

## 2017-11-04 DIAGNOSIS — Z7982 Long term (current) use of aspirin: Secondary | ICD-10-CM

## 2017-11-04 DIAGNOSIS — F200 Paranoid schizophrenia: Secondary | ICD-10-CM | POA: Diagnosis not present

## 2017-11-04 DIAGNOSIS — Z811 Family history of alcohol abuse and dependence: Secondary | ICD-10-CM

## 2017-11-04 DIAGNOSIS — I10 Essential (primary) hypertension: Secondary | ICD-10-CM | POA: Diagnosis present

## 2017-11-04 DIAGNOSIS — T447X5A Adverse effect of beta-adrenoreceptor antagonists, initial encounter: Secondary | ICD-10-CM | POA: Diagnosis not present

## 2017-11-04 DIAGNOSIS — G4730 Sleep apnea, unspecified: Secondary | ICD-10-CM | POA: Diagnosis present

## 2017-11-04 DIAGNOSIS — Z818 Family history of other mental and behavioral disorders: Secondary | ICD-10-CM

## 2017-11-04 DIAGNOSIS — Z91128 Patient's intentional underdosing of medication regimen for other reason: Secondary | ICD-10-CM | POA: Diagnosis not present

## 2017-11-04 DIAGNOSIS — K219 Gastro-esophageal reflux disease without esophagitis: Secondary | ICD-10-CM | POA: Diagnosis present

## 2017-11-04 DIAGNOSIS — R197 Diarrhea, unspecified: Secondary | ICD-10-CM | POA: Diagnosis not present

## 2017-11-04 DIAGNOSIS — E785 Hyperlipidemia, unspecified: Secondary | ICD-10-CM | POA: Diagnosis present

## 2017-11-04 DIAGNOSIS — F203 Undifferentiated schizophrenia: Secondary | ICD-10-CM

## 2017-11-04 DIAGNOSIS — F419 Anxiety disorder, unspecified: Secondary | ICD-10-CM | POA: Diagnosis present

## 2017-11-04 DIAGNOSIS — M069 Rheumatoid arthritis, unspecified: Secondary | ICD-10-CM | POA: Diagnosis present

## 2017-11-04 DIAGNOSIS — E119 Type 2 diabetes mellitus without complications: Secondary | ICD-10-CM | POA: Diagnosis present

## 2017-11-04 DIAGNOSIS — Z79899 Other long term (current) drug therapy: Secondary | ICD-10-CM

## 2017-11-04 DIAGNOSIS — Z885 Allergy status to narcotic agent status: Secondary | ICD-10-CM

## 2017-11-04 DIAGNOSIS — Z7984 Long term (current) use of oral hypoglycemic drugs: Secondary | ICD-10-CM | POA: Diagnosis not present

## 2017-11-04 DIAGNOSIS — M797 Fibromyalgia: Secondary | ICD-10-CM | POA: Diagnosis present

## 2017-11-04 DIAGNOSIS — D320 Benign neoplasm of cerebral meninges: Secondary | ICD-10-CM | POA: Diagnosis present

## 2017-11-04 DIAGNOSIS — F209 Schizophrenia, unspecified: Principal | ICD-10-CM | POA: Diagnosis present

## 2017-11-04 DIAGNOSIS — T43596A Underdosing of other antipsychotics and neuroleptics, initial encounter: Secondary | ICD-10-CM | POA: Diagnosis present

## 2017-11-04 DIAGNOSIS — Z902 Acquired absence of lung [part of]: Secondary | ICD-10-CM | POA: Diagnosis not present

## 2017-11-04 DIAGNOSIS — Z96652 Presence of left artificial knee joint: Secondary | ICD-10-CM | POA: Diagnosis not present

## 2017-11-04 HISTORY — DX: Stiffness of left knee, not elsewhere classified: M25.662

## 2017-11-04 HISTORY — DX: Sleep apnea, unspecified: G47.30

## 2017-11-04 HISTORY — DX: Palpitations: R00.2

## 2017-11-04 HISTORY — DX: Stiffness of right knee, not elsewhere classified: M25.661

## 2017-11-04 HISTORY — DX: Sex counseling, unspecified: Z70.9

## 2017-11-04 HISTORY — DX: Encounter for pre-employment examination: Z02.1

## 2017-11-04 HISTORY — DX: Presence of left artificial knee joint: Z96.652

## 2017-11-04 HISTORY — DX: Pruritus, unspecified: L29.9

## 2017-11-04 HISTORY — DX: Encounter for screening for malignant neoplasm of cervix: Z12.4

## 2017-11-04 HISTORY — DX: Generalized hyperhidrosis: R61

## 2017-11-04 HISTORY — DX: Apnea, not elsewhere classified: R06.81

## 2017-11-04 HISTORY — DX: Reserved for concepts with insufficient information to code with codable children: IMO0002

## 2017-11-04 LAB — POTASSIUM: Potassium: 3.4 mmol/L — ABNORMAL LOW (ref 3.5–5.1)

## 2017-11-04 MED ORDER — METFORMIN HCL 500 MG PO TABS
500.0000 mg | ORAL_TABLET | Freq: Two times a day (BID) | ORAL | Status: DC
  Administered 2017-11-04 – 2017-11-18 (×19): 500 mg via ORAL
  Filled 2017-11-04 (×21): qty 1

## 2017-11-04 MED ORDER — BENZTROPINE MESYLATE 1 MG PO TABS: 0.5000 mg | ORAL_TABLET | Freq: Every day | ORAL | Status: DC

## 2017-11-04 MED ORDER — ATENOLOL 100 MG PO TABS
100.0000 mg | ORAL_TABLET | Freq: Every day | ORAL | Status: DC
  Administered 2017-11-05: 100 mg via ORAL
  Filled 2017-11-04 (×3): qty 1

## 2017-11-04 MED ORDER — PANTOPRAZOLE SODIUM 40 MG PO TBEC
40.0000 mg | DELAYED_RELEASE_TABLET | Freq: Every day | ORAL | Status: DC
  Administered 2017-11-05 – 2017-11-18 (×8): 40 mg via ORAL
  Filled 2017-11-04 (×11): qty 1

## 2017-11-04 MED ORDER — DIPHENHYDRAMINE HCL 50 MG/ML IJ SOLN
50.0000 mg | Freq: Once | INTRAMUSCULAR | Status: AC
  Administered 2017-11-04: 50 mg via INTRAVENOUS
  Filled 2017-11-04: qty 1

## 2017-11-04 MED ORDER — LISINOPRIL 10 MG PO TABS: 20.0000 mg | ORAL_TABLET | Freq: Every day | ORAL | Status: DC

## 2017-11-04 MED ORDER — HYDROCHLOROTHIAZIDE 25 MG PO TABS
25.0000 mg | ORAL_TABLET | Freq: Every day | ORAL | Status: DC
  Administered 2017-11-05: 25 mg via ORAL
  Filled 2017-11-04 (×2): qty 1

## 2017-11-04 MED ORDER — PANTOPRAZOLE SODIUM 40 MG PO TBEC: 40.0000 mg | DELAYED_RELEASE_TABLET | Freq: Every day | ORAL | Status: DC

## 2017-11-04 MED ORDER — HALOPERIDOL LACTATE 5 MG/ML IJ SOLN
5.0000 mg | Freq: Once | INTRAMUSCULAR | Status: AC
  Administered 2017-11-04: 5 mg via INTRAMUSCULAR
  Filled 2017-11-04: qty 1

## 2017-11-04 MED ORDER — LISINOPRIL 20 MG PO TABS
20.0000 mg | ORAL_TABLET | Freq: Every day | ORAL | Status: DC
  Administered 2017-11-05: 20 mg via ORAL
  Filled 2017-11-04 (×2): qty 1

## 2017-11-04 MED ORDER — OLANZAPINE 10 MG PO TABS: 20.0000 mg | ORAL_TABLET | Freq: Every day | ORAL | Status: DC

## 2017-11-04 MED ORDER — OLANZAPINE 10 MG PO TABS
20.0000 mg | ORAL_TABLET | Freq: Every day | ORAL | Status: DC
  Administered 2017-11-04: 20 mg via ORAL
  Filled 2017-11-04: qty 2

## 2017-11-04 MED ORDER — BENZTROPINE MESYLATE 1 MG PO TABS
0.5000 mg | ORAL_TABLET | Freq: Every day | ORAL | Status: DC
  Administered 2017-11-04 – 2017-11-17 (×13): 0.5 mg via ORAL
  Filled 2017-11-04 (×14): qty 1

## 2017-11-04 MED ORDER — ROSUVASTATIN CALCIUM 40 MG PO TABS: 40.0000 mg | ORAL_TABLET | Freq: Every day | ORAL | Status: DC

## 2017-11-04 MED ORDER — ASPIRIN EC 81 MG PO TBEC
81.0000 mg | DELAYED_RELEASE_TABLET | Freq: Every day | ORAL | Status: DC
  Administered 2017-11-05 – 2017-11-18 (×10): 81 mg via ORAL
  Filled 2017-11-04 (×12): qty 1

## 2017-11-04 MED ORDER — ASPIRIN EC 81 MG PO TBEC
81.0000 mg | DELAYED_RELEASE_TABLET | Freq: Every day | ORAL | Status: DC
Start: 2017-11-05 — End: 2017-11-04

## 2017-11-04 MED ORDER — HALOPERIDOL DECANOATE 100 MG/ML IM SOLN
50.0000 mg | Freq: Once | INTRAMUSCULAR | Status: AC
  Administered 2017-11-04: 50 mg via INTRAMUSCULAR
  Filled 2017-11-04: qty 0.5

## 2017-11-04 MED ORDER — TRAZODONE HCL 50 MG PO TABS: 50.0000 mg | ORAL_TABLET | Freq: Every evening | ORAL | Status: DC | PRN

## 2017-11-04 MED ORDER — HYDROCHLOROTHIAZIDE 25 MG PO TABS: 25.0000 mg | ORAL_TABLET | Freq: Every day | ORAL | Status: DC

## 2017-11-04 MED ORDER — HYDROXYZINE HCL 25 MG PO TABS
25.0000 mg | ORAL_TABLET | Freq: Three times a day (TID) | ORAL | Status: DC | PRN
Start: 2017-11-04 — End: 2017-11-18

## 2017-11-04 MED ORDER — MAGNESIUM HYDROXIDE 400 MG/5ML PO SUSP: 30.0000 mL | Freq: Every day | ORAL | Status: DC | PRN

## 2017-11-04 MED ORDER — ALUM & MAG HYDROXIDE-SIMETH 200-200-20 MG/5ML PO SUSP: 30.0000 mL | ORAL | Status: DC | PRN

## 2017-11-04 MED ORDER — HALOPERIDOL DECANOATE 100 MG/ML IM SOLN: 50.0000 mg | Freq: Once | INTRAMUSCULAR | Status: DC

## 2017-11-04 MED ORDER — ROSUVASTATIN CALCIUM 20 MG PO TABS
40.0000 mg | ORAL_TABLET | Freq: Every day | ORAL | Status: DC
  Administered 2017-11-04 – 2017-11-17 (×8): 40 mg via ORAL
  Filled 2017-11-04 (×9): qty 2

## 2017-11-04 NOTE — ED Notes (Signed)
Report given to psychiatrist via telephone.

## 2017-11-04 NOTE — Progress Notes (Signed)
Received Lauren [name] from the ED, alert and long term memory is intact. She is having short term memory recalls and blocking thoughts She verbalized her initial compliant is needing rest, lack of sleep and decrease appetite. She stated feelilng paranoid and  Sometimes sitting chairs against the door. She listed her stressors being confused and unable to pay her bills. She was oriented to her new environment and given toiletries to take a shower.  She listed her sisters as her next of contact. Francene Boyers and Humberto Leep.

## 2017-11-04 NOTE — ED Provider Notes (Addendum)
-----------------------------------------   7:30 AM on 11/04/2017 -----------------------------------------   Blood pressure (!) 147/98, pulse 79, temperature 98.2 F (36.8 C), temperature source Oral, resp. rate 18, height 5\' 4"  (1.626 m), weight 117 kg (258 lb), SpO2 98 %.  Pt is mildly hypertensive but has not yet received her regular dose of Tenormin which is scheduled for 10am.  Will continue to monitor.  Pt asymptomatic.  The patient had no acute events since last update.  Calm and cooperative at this time.  Disposition is pending Psychiatry/Behavioral Medicine team recommendations.  ----------------------------------------- 8:47 AM on 11/04/2017 -----------------------------------------  The patient's repeat troponin is 3.4.    ----------------------------------------- 11:53 AM on 11/04/2017 -----------------------------------------  ED ECG REPORT I, Eula Listen, the attending physician, personally viewed and interpreted this ECG.   Date: 11/04/2017  EKG Time: 1147  Rate: 62  Rhythm: normal sinus rhythm  Axis: leftward  Intervals:none  ST&T Change: No STEMI   This EKG is ordered by Dr. Weber Cooks and interpreted by me:     Eula Listen, MD 11/04/17 0730    Eula Listen, MD 11/04/17 3643    Eula Listen, MD 11/04/17 8377    Eula Listen, MD 11/04/17 1154

## 2017-11-04 NOTE — Tx Team (Signed)
Initial Treatment Plan 11/04/2017 4:34 PM Lauren [name] YNX:[id number]    PATIENT STRESSORS: Financial difficulties Health problems Medication change or noncompliance   PATIENT STRENGTHS: Ability for insight Motivation for treatment/growth Supportive family/friends   PATIENT IDENTIFIED PROBLEMS: Confused   Unable to pay her Bills                   DISCHARGE CRITERIA:  Ability to meet basic life and health needs Adequate post-discharge living arrangements Improved stabilization in mood, thinking, and/or behavior  PRELIMINARY DISCHARGE PLAN: Attend aftercare/continuing care group Return to previous living arrangement  PATIENT/FAMILY INVOLVEMENT: This treatment plan has been presented to and reviewed with the patient, .  The patient has been given the opportunity to ask questions and make suggestions.  Roxy Manns, RN 11/04/2017, 4:34 PM

## 2017-11-04 NOTE — Consult Note (Signed)
[facility] Psychiatry Consult   Reason for Consult: Consult for 67 year old woman with a history of schizophrenia brought in under IVC at the concern of her family Referring Physician: Mariea Clonts Patient Identification: Lauren [name] MRN:  [id number] Principal Diagnosis: Schizophrenia, undifferentiated ([place]) Diagnosis:   Patient Active Problem List   Diagnosis Date Noted  . Schizophrenia, undifferentiated ([place]) [F20.3] 09/08/2017  . Screening for cervical cancer [Z12.4] 07/29/2017  . 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 Gravida 1 [IMO0002] 10/26/2015  . Dentagra [K08.89] 10/26/2015  . Itch of skin [L29.9] 10/26/2015  . Sex counseling [Z70.9] 10/26/2015  . Excessive sweating [R61] 07/05/2015  . Acid reflux [K21.9] 06/29/2015  . Encounter for pre-employment examination [Z02.1] 06/29/2015  . Diabetes mellitus type 2, controlled, without complications ([place]) [M54.6] 06/29/2015  . Cardiac murmur [R01.1] 06/29/2015  . Arthritis of knee, degenerative [M17.10] 06/28/2015  . Stiffness of both knees [M25.661, M25.662] 06/22/2015  . Insomnia w/ sleep apnea [G47.00, G47.30] 03/21/2015  . Anxiety disorder [F41.9] 03/21/2015  . Hypertension [I10] 03/21/2015  . HLD (hyperlipidemia) [E78.5] 03/21/2015  . Paranoid schizophrenia ([place]) [F20.0] 03/21/2015  . Urge incontinence [N39.41] 03/21/2015  . Breathlessness on exertion [R06.81] 02/02/2014  . Awareness of heartbeats [R00.2] 02/02/2014  . Apnea, sleep [G47.30] 02/02/2014    Total Time spent with patient: 1 hour  Subjective:   Lauren [name] is a 67 y.o. female patient admitted with "I have just been a little paranoid".  HPI: Patient interviewed chart reviewed.  This is a 67 year old woman with a history of schizophrenia who was just discharged from the hospital about a month ago.  Patient tells me that she has been fasting and praying for the last week or so.  She tells me she has  been fasting for 2 weeks although I doubt it has been quite that much as she does not look emaciated.  She admits however that she is not eating well.  Her sleep is irregular.  She tells me she takes her medicine "when I can find it" which suggests to me that she is probably not very compliant.  I do note from her chart that she has refused to get her Haldol Decanoate shot from her outpatient provider this month.  Patient tells me she is feeling "a little paranoid" and that she is pleased that she still has insight into it.  At the same time it is affecting her behavior since she tells me she thinks the water coming through her Is poisoned and that bottled water is probably poisoned also.  Also tells me she is seeing "apparitions".  Evidently family came by and visited her yesterday and got concerned about her condition.  Social history: Patient lives independently in a rented house.  Has siblings and other family who check up on her regularly.  Medical history: Diabetes usually controlled with oral medicine and diet.  Hypertension on medication.  Gastric reflux.  History of arthritis but had knee surgery last year and is feeling a little better from that.  Substance abuse history: No alcohol or drug history  Past Psychiatric History: Patient has a diagnosis of schizophrenia either paranoid or undifferentiated type and has had several hospitalizations over the years.  She has partial insight and understands that on one level she has a mental illness but then gets paranoid and confused and will not take her medicine regularly.  Has historically done quite well on Haldol but tends to refused to take the shots in its  dubious how well she is doing living independently.  No history of suicide attempts or violence.  Risk to Self: Is patient at risk for suicide?: No Risk to Others:   Prior Inpatient Therapy:   Prior Outpatient Therapy:    Past Medical History:  Past Medical History:  Diagnosis Date  .  Anxiety   . Diabetes mellitus   . Fibromyalgia   . GERD (gastroesophageal reflux disease)   . Heart murmur   . Herniated disc   . Hyperlipidemia   . Hypertension   . Lack of bladder control   . Lung tumor   . Rheumatoid arthritis ([place])   . Schizoaffective disorder ([place])   . Seizure [facility])    after brain surgery 2012. last seizure 2013!  [name] Sleep apnea     Past Surgical History:  Procedure Laterality Date  . BRAIN TUMOR EXCISION  2012   benign  . CARDIAC CATHETERIZATION  2008  . CESAREAN SECTION    . CYST REMOVAL HAND    . LUNG LOBECTOMY  1977   benign tumor  . 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 Heart disease Brother   . Depression Mother   . Heart attack Mother   . Stroke Mother   . Alcohol abuse Father   . Stroke Father   . Diabetes Sister   . Diabetes Sister   . Stomach cancer Sister   . Kidney disease Sister   . COPD Brother   . Lung cancer Brother   . Diabetes Brother    Family Psychiatric  History: She says her son also has mental illness and that 1 of her aunts was mentally ill as well Social History:  Social History   Substance and Sexual Activity  Alcohol Use No  . Alcohol/week: 0.0 oz     Social History   Substance and Sexual Activity  Drug Use No    Social History   Socioeconomic History  . Marital status: Single    Spouse name: None  . Number of children: None  . Years of education: None  . Highest education level: None  Social Needs  . Financial resource strain: Not hard at all  . Food insecurity - worry: Never true  . Food insecurity - inability: Never true  . Transportation needs - medical: No  . Transportation needs - non-medical: No  Occupational History  . None  Tobacco Use  . Smoking status: Never Smoker  . Smokeless tobacco:  Never Used  Substance and Sexual Activity  . Alcohol use: No    Alcohol/week: 0.0 oz  . Drug use: No  . Sexual activity: No  Other Topics Concern  . None  Social History Narrative  . None   Additional Social History:    Allergies:   Allergies  Allergen Reactions  . Percocet [Oxycodone-Acetaminophen] Diarrhea, Nausea And Vomiting and Nausea Only  . Tramadol Hcl Diarrhea, Nausea And Vomiting and Nausea Only  . Vicodin [Hydrocodone-Acetaminophen] Diarrhea, Nausea And Vomiting and Nausea Only    Labs:  Results for orders placed or performed during the hospital encounter of 11/03/17 (from the past 48 hour(s))  Comprehensive metabolic panel     Status: Abnormal   Collection Time: 11/03/17  8:09 PM  Result Value Ref Range   Sodium 136 135 - 145  mmol/L   Potassium 2.7 (LL) 3.5 - 5.1 mmol/L    Comment: CRITICAL RESULT CALLED TO, READ BACK BY AND VERIFIED WITH SONYA WEAVER ON 11/03/17 AT 2053 [facility]    Chloride 101 101 - 111 mmol/L   CO2 23 22 - 32 mmol/L   Glucose, Bld 181 (H) 65 - 99 mg/dL   BUN 13 6 - 20 mg/dL   Creatinine, Ser 0.73 0.44 - 1.00 mg/dL   Calcium 9.5 8.9 - 10.3 mg/dL   Total Protein 8.0 6.5 - 8.1 g/dL   Albumin 4.5 3.5 - 5.0 g/dL   AST 53 (H) 15 - 41 U/L   ALT 56 (H) 14 - 54 U/L   Alkaline Phosphatase 60 38 - 126 U/L   Total Bilirubin 1.7 (H) 0.3 - 1.2 mg/dL   GFR calc non Af Amer >60 >60 mL/min   GFR calc Af Amer >60 >60 mL/min    Comment: (NOTE) The eGFR has been calculated using the CKD EPI equation. This calculation has not been validated in all clinical situations. eGFR's persistently <60 mL/min signify possible Chronic Kidney Disease.    Anion gap 12 5 - 15    Comment: Performed at [facility], [address] [place], [place] [address]  Ethanol     Status: None   Collection Time: 11/03/17  8:09 PM  Result Value Ref Range   Alcohol, Ethyl (B) <10 <10 mg/dL    Comment:        LOWEST DETECTABLE LIMIT FOR SERUM ALCOHOL IS 10 mg/dL FOR  MEDICAL PURPOSES ONLY Performed at [facility], [address] [place], [place] [address]   Salicylate level     Status: None   Collection Time: 11/03/17  8:09 PM  Result Value Ref Range   Salicylate Lvl <3.7 2.8 - 30.0 mg/dL    Comment: Performed at [facility], [address] [place], [place] [address]  Acetaminophen level     Status: Abnormal   Collection Time: 11/03/17  8:09 PM  Result Value Ref Range   Acetaminophen (Tylenol), Serum <10 (L) 10 - 30 ug/mL    Comment:        THERAPEUTIC CONCENTRATIONS VARY SIGNIFICANTLY. A RANGE OF 10-30 ug/mL MAY BE AN EFFECTIVE CONCENTRATION FOR MANY PATIENTS. HOWEVER, SOME ARE BEST TREATED AT CONCENTRATIONS OUTSIDE THIS RANGE. ACETAMINOPHEN CONCENTRATIONS >150 ug/mL AT 4 HOURS AFTER INGESTION AND >50 ug/mL AT 12 HOURS AFTER INGESTION ARE OFTEN ASSOCIATED WITH TOXIC REACTIONS. Performed at [facility], [address] [place], [place] [address]   cbc     Status: Abnormal   Collection Time: 11/03/17  8:09 PM  Result Value Ref Range   WBC 3.8 3.6 - 11.0 K/uL   RBC 4.43 3.80 - 5.20 MIL/uL   Hemoglobin 14.0 12.0 - 16.0 g/dL   HCT 41.6 35.0 - 47.0 %   MCV 93.8 80.0 - 100.0 fL   MCH 31.7 26.0 - 34.0 pg   MCHC 33.8 32.0 - 36.0 g/dL   RDW 15.4 (H) 11.5 - 14.5 %   Platelets 184 150 - 440 K/uL    Comment: Performed at [facility], [address], [place], [place] [address]  Magnesium     Status: Abnormal   Collection Time: 11/03/17  8:09 PM  Result Value Ref Range   Magnesium 1.5 (L) 1.7 - 2.4 mg/dL    Comment: Performed at [facility], [address], [place], [place] [address]  Potassium     Status: Abnormal  Collection Time: 11/04/17  7:15 AM  Result Value Ref Range   Potassium 3.4 (L) 3.5 - 5.1 mmol/L    Comment: HEMOLYSIS AT THIS LEVEL MAY AFFECT RESULT Performed at [facility], [address] [place], [place] [address]     Current Facility-Administered  Medications  Medication Dose Route Frequency Provider Last Rate Last Dose  . aspirin EC tablet 81 mg  81 mg Oral QAC breakfast Delman Kitten, MD   81 mg at 11/04/17 1660  . atenolol (TENORMIN) tablet 100 mg  100 mg Oral Daily Delman Kitten, MD   100 mg at 11/04/17 0908  . benztropine (COGENTIN) tablet 0.5 mg  0.5 mg Oral QHS Delman Kitten, MD   0.5 mg at 11/03/17 2324  . haloperidol decanoate (HALDOL DECANOATE) 100 MG/ML injection 50 mg  50 mg Intramuscular Once Jeniel Slauson T, MD      . hydrochlorothiazide (HYDRODIURIL) tablet 25 mg  25 mg Oral Daily Lorane Cousar T, MD      . lisinopril (PRINIVIL,ZESTRIL) tablet 20 mg  20 mg Oral Daily Alena Blankenbeckler T, MD      . metFORMIN (GLUCOPHAGE) tablet 500 mg  500 mg Oral BID WC Delman Kitten, MD   500 mg at 11/04/17 6301  . OLANZapine (ZYPREXA) tablet 20 mg  20 mg Oral QHS Kurtiss Wence T, MD      . pantoprazole (PROTONIX) EC tablet 40 mg  40 mg Oral Daily Jaryn Rosko T, MD      . rosuvastatin (CRESTOR) tablet 40 mg  40 mg Oral q1800 Lizett Chowning, [name], MD       Current Outpatient Medications  Medication Sig Dispense Refill  . aspirin EC 81 MG tablet Take 81 mg by mouth daily before breakfast.     . atenolol (TENORMIN) 100 MG tablet TAKE 1 TABLET BY MOUTH  DAILY 90 tablet 0  . benztropine (COGENTIN) 0.5 MG tablet Take 1 tablet (0.5 mg total) by mouth at bedtime. 90 tablet 1  . Bioflavonoid Products (VITAMIN C) CHEW Chew 1 tablet by mouth daily as needed (for immune system support).    . Capsaicin (ZOSTRIX EX) Apply 1 application topically 4 (four) times daily as needed (for knee pain.).    [name] haloperidol (HALDOL) 10 MG tablet Take 1 tablet (10 mg total) by mouth at bedtime. 90 tablet 1  . ibuprofen (ADVIL,MOTRIN) 200 MG tablet Take 600 mg by mouth every 8 (eight) hours as needed (for knee pain.).    [name] lisinopril-hydrochlorothiazide (PRINZIDE,ZESTORETIC) 20-25 MG tablet TAKE 1 TABLET BY MOUTH  DAILY 90 tablet 0  . Lotion Base LOTN Apply 1 application topically  4 (four) times daily as needed (for knee pain.). Two [employer] Essential Lotion (Aloe Vera, [employer], and 6 natural anti-inflammatory essential oils; [place], [place], [place], [place], Peppermint and [place])    . metFORMIN (GLUCOPHAGE) 500 MG tablet TAKE 1 TABLET BY MOUTH TWICE DAILY WITH A MEAL 60 tablet 0  . omeprazole (PRILOSEC) 20 MG capsule TAKE 1 CAPSULE BY MOUTH  DAILY 90 capsule 0  . QUEtiapine (SEROQUEL) 25 MG tablet Take 1 tablet (25 mg total) by mouth at bedtime as needed. 90 tablet 1  . rosuvastatin (CRESTOR) 40 MG tablet TAKE 1 TABLET BY MOUTH  DAILY 90 tablet 0    Musculoskeletal: Strength & Muscle Tone: within normal limits Gait & Station: normal Patient leans: N/A  Psychiatric Specialty Exam: Physical Exam  Nursing note and vitals reviewed. Constitutional: She appears well-developed and well-nourished.  HENT:  Head: Normocephalic and atraumatic.  Eyes: Conjunctivae are normal. Pupils are equal, round, and reactive to light.  Neck: Normal range of motion.  Cardiovascular: Regular rhythm and normal heart sounds.  Respiratory: Effort normal. No respiratory distress.  GI: Soft.  Musculoskeletal: Normal range of motion.  Neurological: She is alert.  Skin: Skin is warm and dry.  Psychiatric: Her mood appears anxious. Her speech is delayed. She is slowed. Thought content is paranoid. Cognition and memory are impaired. She expresses impulsivity.    Review of Systems  Constitutional: Negative.   HENT: Negative.   Eyes: Negative.   Respiratory: Negative.   Cardiovascular: Negative.   Gastrointestinal: Negative.   Musculoskeletal: Negative.   Skin: Negative.   Neurological: Negative.   Psychiatric/Behavioral: Positive for hallucinations. Negative for depression, memory loss, substance abuse and suicidal ideas. The patient is nervous/anxious and has insomnia.     Blood pressure (!) 147/98, pulse 79, temperature 98.2 F (36.8 C), temperature  source Oral, resp. rate 18, height 5' 4"  (1.626 m), weight 117 kg (258 lb), SpO2 98 %.Body mass index is 44.29 kg/m.  General Appearance: Casual  Eye Contact:  Good  Speech:  Blocked  Volume:  Decreased  Mood:  Euthymic  Affect:  Constricted  Thought Process:  Goal Directed  Orientation:  Full (Time, Place, and Person)  Thought Content:  Delusions, Hallucinations: Visual and Paranoid Ideation  Suicidal Thoughts:  No  Homicidal Thoughts:  No  Memory:  Immediate;   Fair Recent;   Fair Remote;   Fair  Judgement:  Impaired  Insight:  Shallow  Psychomotor Activity:  Normal  Concentration:  Concentration: Fair  Recall:  [employer] of Knowledge:  Fair  Language:  Fair  Akathisia:  No  Handed:  Right  AIMS (if indicated):     Assets:  Desire for Improvement Housing Social Support  ADL's:  Impaired  Cognition:  WNL  Sleep:        Treatment Plan Summary: Daily contact with patient to assess and evaluate symptoms and progress in treatment, Medication management and Plan 67 year old woman with schizophrenia.  While she is not suicidal she does engage in dangerous behaviors such as fasting and is noncompliant with medication and is displaying clear psychotic symptoms.  She is appropriate for admission to the unit because of psychosis.  Patient informed of the plan.  She is not that please but understands that this is what we will be doing.  I have ordered her medicines based on what she was taking last time she was here reordered EKG and lab set and also gone ahead and ordered her Haldol decanoate shot.  Case reviewed with TTS and ER physician  Disposition: Recommend psychiatric Inpatient admission when medically cleared. Supportive therapy provided about ongoing stressors.  Alethia Berthold, MD 11/04/2017 11:12 AM

## 2017-11-04 NOTE — Progress Notes (Signed)
Patient ID: Lauren [name], female   DOB: 1951/04/23, 67 y.o.   MRN: [id number] Patient in room awake in bed, initially pleasant on approach, HOH, "DSS called an Ambulance that brought me here, why? I am still trying to find that out; [place]. If you don't mind, I am tired and I need to rest..." Medications brought to the bedside at bedtime.

## 2017-11-04 NOTE — ED Notes (Signed)
Report called to Behavioral Unit downstairs. All questions answered. VSS. NAD.

## 2017-11-04 NOTE — ED Notes (Signed)
Pt is in room resting in bed with eyes closed

## 2017-11-04 NOTE — ED Notes (Signed)
Sister is here to visit with the patient.

## 2017-11-04 NOTE — ED Notes (Signed)
This tech checked on pt and was given breakfast and a copy of the bible per pt request; pt in good spirits and smiled back at this tech

## 2017-11-04 NOTE — BH Assessment (Signed)
Patient is to be admitted to [facility] by Dr. Weber Cooks.  Attending Physician will be Dr. Bary Leriche.   Patient has been assigned to room 310, by [facility] Nurse Mechele Claude.   Intake Paper Work has been signed and placed on patient chart.  ER staff is aware of the admission:  [name], ER Dian Situ   Dr. Mariea Clonts, ER MD   Claiborne Billings, Patient's Nurse   Merrily Pew, Patient Access.

## 2017-11-04 NOTE — ED Notes (Signed)
[facility] psychiatrist interviewing patient.

## 2017-11-05 ENCOUNTER — Encounter: Payer: Self-pay | Admitting: Psychiatry

## 2017-11-05 DIAGNOSIS — F209 Schizophrenia, unspecified: Principal | ICD-10-CM

## 2017-11-05 LAB — LIPID PANEL
Cholesterol: 217 mg/dL — ABNORMAL HIGH (ref 0–200)
HDL: 34 mg/dL — ABNORMAL LOW (ref >40)
LDL Cholesterol: 163 mg/dL — ABNORMAL HIGH (ref 0–99)
Total CHOL/HDL Ratio: 6.4 RATIO
Triglycerides: 102 mg/dL (ref <150)
VLDL: 20 mg/dL (ref 0–40)

## 2017-11-05 LAB — TSH: TSH: 0.942 u[IU]/mL (ref 0.350–4.500)

## 2017-11-05 LAB — GLUCOSE, CAPILLARY: Glucose-Capillary: 120 mg/dL — ABNORMAL HIGH (ref 65–99)

## 2017-11-05 LAB — HEMOGLOBIN A1C
Hgb A1c MFr Bld: 6.2 % — ABNORMAL HIGH (ref 4.8–5.6)
Mean Plasma Glucose: 131.24 mg/dL

## 2017-11-05 MED ORDER — QUETIAPINE FUMARATE 25 MG PO TABS
25.0000 mg | ORAL_TABLET | Freq: Every evening | ORAL | Status: DC | PRN
  Filled 2017-11-05: qty 1

## 2017-11-05 MED ORDER — POTASSIUM CHLORIDE CRYS ER 20 MEQ PO TBCR
20.0000 meq | EXTENDED_RELEASE_TABLET | Freq: Once | ORAL | Status: DC
  Filled 2017-11-05: qty 1

## 2017-11-05 MED ORDER — OLANZAPINE 10 MG PO TABS: 10.0000 mg | ORAL_TABLET | Freq: Every day | ORAL | Status: DC

## 2017-11-05 MED ORDER — HALOPERIDOL DECANOATE 100 MG/ML IM SOLN: 50.0000 mg | Freq: Once | INTRAMUSCULAR | Status: DC

## 2017-11-05 MED ORDER — HALOPERIDOL 5 MG PO TABS
5.0000 mg | ORAL_TABLET | Freq: Every day | ORAL | Status: DC
  Administered 2017-11-05 – 2017-11-09 (×5): 5 mg via ORAL
  Filled 2017-11-05 (×5): qty 1

## 2017-11-05 NOTE — BHH Suicide Risk Assessment (Signed)
[facility] INPATIENT:  Family/Significant Other Suicide Prevention Education  Suicide Prevention Education:  Education Completed; Marcial Pacas, Sister, [phone] has been identified by the patient as the family member/significant other with whom the patient will be residing, and identified as the person(s) who will aid the patient in the event of a mental health crisis (suicidal ideations/suicide attempt).  With written consent from the patient, the family member/significant other has been provided the following suicide prevention education, prior to the and/or following the discharge of the patient.  The suicide prevention education provided includes the following:  Suicide risk factors  Suicide prevention and interventions  National Suicide Hotline telephone number  [facility] assessment telephone number  [facility] Emergency Assistance [place] and/or Residential Mobile Crisis Unit telephone number  Request made of family/significant other to:  Remove weapons (e.g., guns, rifles, knives), all items previously/currently identified as safety concern.    Remove drugs/medications (over-the-counter, prescriptions, illicit drugs), all items previously/currently identified as a safety concern.  The family member/significant other verbalizes understanding of the suicide prevention education information provided.  The family member/significant other agrees to remove the items of safety concern listed above.  Sister says that the patient is real secretive and she doesn't fully know what's going on. She says that when the doctor took her off the Haldol. She says that the Haldol works for her. She reports that the patient doesn't have any guns/weapons in the home.   Darin Engels 11/05/2017, 12:39 PM

## 2017-11-05 NOTE — Progress Notes (Signed)
Patient ID: Lauren [name], female   DOB: 02-17-1951, 67 y.o.   MRN: [id number] Pleasant on approach, defensive, assertive but uncoordinated, fragmented and disorganized in thoughts, speech sttutered, disrupted, slurred and broken, "I am loosing my con--cen--ttt-rations, I can not think, this is worse than before I came in; I told the doctor that the only medications that I am taking is Haldol and Cogentin... I know the colors .." Patient was allowed to inspect the package and read medications (Haldol and Cogentin) before administration at bedside.

## 2017-11-05 NOTE — Progress Notes (Signed)
Patient refused to take all evening medications stating, "Y'all giving me all of these medicines making me so confused that I can't even think straight enough to form a sentence." MD informed of the refusal.

## 2017-11-05 NOTE — Plan of Care (Signed)
Patient slept for Estimated Hours of 5.15; Precautionary checks every 15 minutes for safety maintained, room free of safety hazards, patient sustains no injury or falls during this shift.

## 2017-11-05 NOTE — BHH Suicide Risk Assessment (Signed)
[facility] Admission Suicide Risk Assessment   Nursing information obtained from:    Demographic factors:   67 yo AA female, living alone, single Current Mental Status:   paranoid, confused Loss Factors:   n/a Historical Factors:   long history of mental illness, medication noncompliance Risk Reduction Factors:   supportive family  Total Time spent with patient: 45 minutes Principal Problem: Schizophrenia ([place]) Diagnosis:   Patient Active Problem List   Diagnosis Date Noted  . Schizophrenia ([place]) [F20.9] 11/04/2017    Priority: High  . Acid reflux [K21.9] 06/29/2015  . Diabetes mellitus type 2, controlled, without complications ([place]) [W73.7] 06/29/2015  . Cardiac murmur [R01.1] 06/29/2015  . Arthritis of knee, degenerative [M17.10] 06/28/2015  . Insomnia w/ sleep apnea [G47.00, G47.30] 03/21/2015  . Anxiety disorder [F41.9] 03/21/2015  . Hypertension [I10] 03/21/2015  . HLD (hyperlipidemia) [E78.5] 03/21/2015  . Urge incontinence [N39.41] 03/21/2015   Subjective Data: See H&P  Continued Clinical Symptoms:  Alcohol Use Disorder Identification Test Final Score (AUDIT): 0 The "Alcohol Use Disorders Identification Test", Guidelines for Use in Primary Care, Second Edition.  World Pharmacologist [facility]). Score between 0-7:  no or low risk or alcohol related problems. Score between 8-15:  moderate risk of alcohol related problems. Score between 16-19:  high risk of alcohol related problems. Score 20 or above:  warrants further diagnostic evaluation for alcohol dependence and treatment.   CLINICAL FACTORS:   Schizophrenia:   Paranoid or undifferentiated type     COGNITIVE FEATURES THAT CONTRIBUTE TO RISK:  Closed-mindedness and Thought constriction (tunnel vision)    SUICIDE RISK:   Minimal: No identifiable suicidal ideation. ; may be classified as minimal risk based on the severity of the depressive symptoms  PLAN OF CARE: See H&P  I certify that inpatient services furnished  can reasonably be expected to improve the patient's condition.   Marylin Crosby, MD 11/05/2017, 2:20 PM

## 2017-11-05 NOTE — BHH Counselor (Signed)
Adult Comprehensive Assessment  Patient ID: Lauren [name], female   DOB: 1950-12-05, 67 y.o.   MRN: [id number]  Information Source: Information source: Patient   Current Stressors:  Family Relationships: became paranoid   Living/Environment/Situation:  Living Arrangements: Self What is atmosphere in current home: Comfortable   Family History:  Marital status: Single Are you sexually active?: No Does patient have children?: Yes How many children?: 1 How is patient's relationship with their children?: son   Childhood History:  Did patient suffer any verbal/emotional/physical/sexual abuse as a child?: No Did patient suffer from severe childhood neglect?: No Has patient ever been sexually abused/assaulted/raped as an adolescent or adult?: No Was the patient ever a victim of a crime or a disaster?: No Witnessed domestic violence?: No Has patient been effected by domestic violence as an adult?: No   Education:  Highest grade of school patient has completed: None reported  Currently a student?: No Name of school: N/A Learning disability?: No   Employment/Work Situation:   Employment situation: On disability Why is patient on disability: Currently receiving disability for her nerves and being paranoid Schizophrenic, 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 [employer] How long has patient been on disability: over 5 years Patient's job has been impacted by current illness: Yes Describe how patient's job has been impacted: uncontrollable symptoms What is the longest time patient has a held a job?: unknown 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: No   Social Support System:   Patient's Community Support System: Fair Astronomer System: friends and family Type of faith/religion: christian How does patient's faith help to cope with current illness?: encourages me, describes she sometimes feels guilty, like she must apologizes for something she has done because she feels people must be after her for a reason.   Leisure/Recreation:   Leisure and Hobbies: Reading, watching tv [name], [name], [place] singing)   Strengths/Needs:     Discharge Plan:   Does patient have access to transportation?: Yes Will patient be returning to same living situation after discharge?: Yes Currently receiving community mental health services: Yes (From Whom)([facility]-Dr. Einar Grad) If no, would patient like referral for services when discharged?: N/A Does patient have financial barriers related to discharge medications?: No   Summary/Recommendations:   Summary and Recommendations (to be completed by the evaluator): Summary/Recommendations:   Summary and Recommendations (to be completed by the evaluator):  Patient is 67 year old African American female who was brought to the ER due to worsening paranoia.  She reports living alone in [place]. Patient denies any substance use in the past 12 months. During the assessment she presented paranoid and frustrated. She reports that she currently receives outpatient treatment with [facility]. At discharge, patient will return back to apartment and follow up with her providers in outpatient treatment. While here, patient will benefit from crisis  stabilization, medication evaluation,  group therapy and psychoeducation, in addition to case management for discharge planning. At discharge, it is recommended that patient remain compliant with the established discharge plan and continue treatment.  Darin Engels. 11/05/2017

## 2017-11-05 NOTE — Progress Notes (Signed)
Patient up ad lib with some confusion noted. Patient has exhibited some mood swings today. Earlier this morning patient was pleasant and cooperative, this afternoon patient is not very pleasant and a little argumentative. Compliant with medications overall. PO intake is adequate and milieu remains safe with q 15 minute safety checks.
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
## 2017-11-05 NOTE — [facility] Group Notes (Signed)
11/05/2017 1PM  Type of Therapy/Topic:  Group Therapy:  Emotion Regulation  Participation Level:  Did Not Attend   Description of Group:   The purpose of this group is to assist patients in learning to regulate negative emotions and experience positive emotions. Patients will be guided to discuss ways in which they have been vulnerable to their negative emotions. These vulnerabilities will be juxtaposed with experiences of positive emotions or situations, and patients will be challenged to use positive emotions to combat negative ones. Special emphasis will be placed on coping with negative emotions in conflict situations, and patients will process healthy conflict resolution skills.  Therapeutic Goals: 1. Patient will identify two positive emotions or experiences to reflect on in order to balance out negative emotions 2. Patient will label two or more emotions that they find the most difficult to experience 3. Patient will demonstrate positive conflict resolution skills through discussion and/or role plays  Summary of Patient Progress: Patient was encouraged and invited to attend group. Patient did not attend group. Social worker will continue to encourage group participation in the future.        Therapeutic Modalities:   Cognitive Behavioral Therapy Feelings Identification Dialectical Behavioral Therapy   Darin Engels, LCSW 11/05/2017 1:50 PM

## 2017-11-05 NOTE — Tx Team (Signed)
Interdisciplinary Treatment and Diagnostic Plan Update  11/05/2017 Time of Session: 11am Shaida Route MRN: [id number]  Principal Diagnosis: <principal problem not specified>  Secondary Diagnoses: Active Problems:   Schizophrenia ([place])   Current Medications:  Current Facility-Administered Medications  Medication Dose Route Frequency Provider Last Rate Last Dose  . alum & mag hydroxide-simeth (MAALOX/MYLANTA) 200-200-20 MG/5ML suspension 30 mL  30 mL Oral Q4H PRN [name], [name], MD      . aspirin EC tablet 81 mg  81 mg Oral QAC breakfast [name], [name], MD   81 mg at 11/05/17 0831  . atenolol (TENORMIN) tablet 100 mg  100 mg Oral Daily [name], [name], MD   100 mg at 11/05/17 0831  . benztropine (COGENTIN) tablet 0.5 mg  0.5 mg Oral QHS [name], [name], MD   0.5 mg at 11/04/17 2119  . [START ON 12/02/2017] haloperidol decanoate (HALDOL DECANOATE) 100 MG/ML injection 50 mg  50 mg Intramuscular Once McNew, Holly R, MD      . hydrochlorothiazide (HYDRODIURIL) tablet 25 mg  25 mg Oral Daily [name], [name], MD   25 mg at 11/05/17 0830  . hydrOXYzine (ATARAX/VISTARIL) tablet 25 mg  25 mg Oral TID PRN [name], [name], MD      . lisinopril (PRINIVIL,ZESTRIL) tablet 20 mg  20 mg Oral Daily [name], [name], MD   20 mg at 11/05/17 0831  . magnesium hydroxide (MILK OF MAGNESIA) suspension 30 mL  30 mL Oral Daily PRN [name], [name], MD      . metFORMIN (GLUCOPHAGE) tablet 500 mg  500 mg Oral BID WC [name], [name], MD   500 mg at 11/05/17 0831  . OLANZapine (ZYPREXA) tablet 20 mg  20 mg Oral QHS [name], [name], MD   20 mg at 11/04/17 2119  . pantoprazole (PROTONIX) EC tablet 40 mg  40 mg Oral Daily [name], [name], MD   40 mg at 11/05/17 0831  . rosuvastatin (CRESTOR) tablet 40 mg  40 mg Oral q1800 [name], [name], MD   40 mg at 11/04/17 1756  . traZODone (DESYREL) tablet 50 mg  50 mg Oral QHS PRN [name], [name], MD       PTA Medications: Medications Prior to Admission  Medication Sig  Dispense Refill Last Dose  . aspirin EC 81 MG tablet Take 81 mg by mouth daily before breakfast.    unknown  . atenolol (TENORMIN) 100 MG tablet TAKE 1 TABLET BY MOUTH  DAILY 90 tablet 0 unknown  . benztropine (COGENTIN) 0.5 MG tablet Take 1 tablet (0.5 mg total) by mouth at bedtime. 90 tablet 1 unknown  . Bioflavonoid Products (VITAMIN C) CHEW Chew 1 tablet by mouth daily as needed (for immune system support).   unknown  . Capsaicin (ZOSTRIX EX) Apply 1 application topically 4 (four) times daily as needed (for knee pain.).   unknown  . haloperidol (HALDOL) 10 MG tablet Take 1 tablet (10 mg total) by mouth at bedtime. 90 tablet 1 unknown  . ibuprofen (ADVIL,MOTRIN) 200 MG tablet Take 600 mg by mouth every 8 (eight) hours as needed (for knee pain.).   unknown  . lisinopril-hydrochlorothiazide (PRINZIDE,ZESTORETIC) 20-25 MG tablet TAKE 1 TABLET BY MOUTH  DAILY 90 tablet 0 unknown  . Lotion Base LOTN Apply 1 application topically 4 (four) times daily as needed (for knee pain.). [employer] Essential Lotion (Aloe Vera, Almond Oil, and 6 natural anti-inflammatory essential oils; [place], [place], [place], [place], Peppermint  and Fidela [place])   unknown  . metFORMIN (GLUCOPHAGE) 500 MG tablet TAKE 1 TABLET BY MOUTH TWICE DAILY WITH A MEAL 60 tablet 0 unknown  . omeprazole (PRILOSEC) 20 MG capsule TAKE 1 CAPSULE BY MOUTH  DAILY 90 capsule 0 unknown  . QUEtiapine (SEROQUEL) 25 MG tablet Take 1 tablet (25 mg total) by mouth at bedtime as needed. 90 tablet 1 unknown  . rosuvastatin (CRESTOR) 40 MG tablet TAKE 1 TABLET BY MOUTH  DAILY 90 tablet 0 unknown    Patient Stressors: Financial difficulties Health problems Medication change or noncompliance  Patient Strengths: Ability for insight Motivation for treatment/growth Supportive family/friends  Treatment Modalities: Medication Management, Group therapy, Case management,  1 to 1 session with clinician, Psychoeducation, Recreational  therapy.   Physician Treatment Plan for Primary Diagnosis: <principal problem not specified> Long Term Goal(s):     Short Term Goals:    Medication Management: Evaluate patient's response, side effects, and tolerance of medication regimen.  Therapeutic Interventions: 1 to 1 sessions, Unit Group sessions and Medication administration.  Evaluation of Outcomes: Progressing  Physician Treatment Plan for Secondary Diagnosis: Active Problems:   Schizophrenia ([place])  Long Term Goal(s):     Short Term Goals:       Medication Management: Evaluate patient's response, side effects, and tolerance of medication regimen.  Therapeutic Interventions: 1 to 1 sessions, Unit Group sessions and Medication administration.  Evaluation of Outcomes: Progressing   RN Treatment Plan for Primary Diagnosis: <principal problem not specified> Long Term Goal(s): Knowledge of disease and therapeutic regimen to maintain health will improve  Short Term Goals: Ability to verbalize frustration and anger appropriately will improve, Ability to demonstrate self-control, Ability to identify and develop effective coping behaviors will improve and Compliance with prescribed medications will improve  Medication Management: RN will administer medications as ordered by provider, will assess and evaluate patient's response and provide education to patient for prescribed medication. RN will report any adverse and/or side effects to prescribing provider.  Therapeutic Interventions: 1 on 1 counseling sessions, Psychoeducation, Medication administration, Evaluate responses to treatment, Monitor vital signs and CBGs as ordered, Perform/monitor CIWA, COWS, AIMS and Fall Risk screenings as ordered, Perform wound care treatments as ordered.  Evaluation of Outcomes: Progressing   LCSW Treatment Plan for Primary Diagnosis: <principal problem not specified> Long Term Goal(s): Safe transition to appropriate next level of care at  discharge, Engage patient in therapeutic group addressing interpersonal concerns.  Short Term Goals: Engage patient in aftercare planning with referrals and resources, Increase social support, Increase emotional regulation, Facilitate acceptance of mental health diagnosis and concerns, Identify triggers associated with mental health/substance abuse issues and Increase skills for wellness and recovery  Therapeutic Interventions: Assess for all discharge needs, 1 to 1 time with Social worker, Explore available resources and support systems, Assess for adequacy in community support network, Educate family and significant other(s) on suicide prevention, Complete Psychosocial Assessment, Interpersonal group therapy.  Evaluation of Outcomes: Progressing   Progress in Treatment: Attending groups: Yes. Participating in groups: Yes. Taking medication as prescribed: Yes. Toleration medication: Yes. Family/Significant other contact made: No, will contact:  Marcial Pacas, Patients sister, [phone] Patient understands diagnosis: No. and As evidenced by:  Confusion as to why she has been admitted Discussing patient identified problems/goals with staff: Yes. Medical problems stabilized or resolved: Yes. Denies suicidal/homicidal ideation: Yes. Issues/concerns per patient self-inventory: Yes. Other:  New problem(s) identified: No, Describe:  None  New Short Term/Long Term Goal(s): "To get better."  Discharge Plan  or Barriers: At discharge, patient will return back to apartment and follow up with her providers in outpatient treatment.   Reason for Continuation of Hospitalization: Delusions  Medication stabilization  Estimated Length of Stay: 7+ days  Attendees: Patient: Lou Irigoyen 11/05/2017 11:49 AM  Physician: Dr. Wonda Olds, MD 11/05/2017 11:49 AM  Nursing: Eugenio Hoes, RN 11/05/2017 11:49 AM  RN Care Manager: 11/05/2017 11:49 AM  Social Worker: Darin Engels, [place] 11/05/2017 11:49 AM   Recreational Therapist:  11/05/2017 11:49 AM  Other: Alden Hipp, LCSW 11/05/2017 11:49 AM  Other:  11/05/2017 11:49 AM  Other: 11/05/2017 11:49 AM    Scribe for Treatment Team: Darin Engels, LCSW 11/05/2017 11:49 AM

## 2017-11-06 ENCOUNTER — Inpatient Hospital Stay: Payer: Medicare Other

## 2017-11-06 MED ORDER — IOPAMIDOL (ISOVUE-300) INJECTION 61%
75.0000 mL | Freq: Once | INTRAVENOUS | Status: AC | PRN
  Administered 2017-11-06: 75 mL via INTRAVENOUS

## 2017-11-06 NOTE — Progress Notes (Signed)
[facility] MD Progress Note  11/06/2017 11:23 AM Lauren [name]  MRN:  [id number] Subjective:  Pt less irritable with this provider today. She states that she refused her blood pressure medications because she was feeling way too confused when she took them and she feels they are causing it. She is only willing to take Haldol and Cogentin. Pt is more organized and able to have a rational conversation today. However, still paranoid. She is unwilling to allow me to do physical exam to feel for any extremity stiffness. She states, "No I don['t want you to touch me. The devil can be transferred from other people to me." She was paranoid about her medications last night and this morning. Closely inspecting and smelling them.  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 She talks about this very appropriately. She is fully oriented to person, place, date and president today. She reports sleeping well last night. She has been calm on the unit.   Principal Problem: Schizophrenia ([place]) Diagnosis:   Patient Active Problem List   Diagnosis Date Noted  . Schizophrenia ([place]) [F20.9] 11/04/2017    Priority: High  . Acid reflux [K21.9] 06/29/2015  . Diabetes mellitus type 2, controlled, without complications ([place]) [W09.8] 06/29/2015  . Cardiac murmur [R01.1] 06/29/2015  . Arthritis of knee, degenerative [M17.10] 06/28/2015  . Insomnia w/ sleep apnea [G47.00, G47.30] 03/21/2015  . Anxiety disorder [F41.9] 03/21/2015  . Hypertension [I10] 03/21/2015  . HLD (hyperlipidemia) [E78.5] 03/21/2015  . Urge incontinence [N39.41] 03/21/2015   Total Time spent with patient: 20 minutes  Past Psychiatric History: See H&P  Past Medical History:  Past Medical History:  Diagnosis Date  . Anxiety   . Apnea, sleep 02/02/2014  . Awareness of heartbeats 02/02/2014  . Breathlessness on exertion 02/02/2014  . Diabetes mellitus   . Encounter for pre-employment  examination 06/29/2015  . Excessive sweating 07/05/2015  . Fibromyalgia   . GERD (gastroesophageal reflux disease)   . Gravida 1 10/26/2015   1.    . Heart murmur   . Herniated disc   . Hyperlipidemia   . Hypertension   . Itch of skin 10/26/2015  . Lack of bladder control   . Lung tumor   . Rheumatoid arthritis ([place])   . Schizoaffective disorder ([place])   . Screening for cervical cancer 07/29/2017  . Seizure [facility])    after brain surgery 2012. last seizure 2013!  [name] Sex counseling 10/26/2015  . Sleep apnea   . 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 Stiffness of both knees 06/22/2015    Past Surgical History:  Procedure Laterality Date  . BRAIN TUMOR EXCISION  2012   benign  . CARDIAC CATHETERIZATION  2008  . CESAREAN SECTION    . CYST REMOVAL HAND    . LUNG LOBECTOMY  1977   benign tumor  . 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 Heart disease Brother   . Depression Mother   . Heart attack Mother   . Stroke Mother   . Alcohol abuse Father   . Stroke Father   . Diabetes Sister   . Diabetes Sister   . Stomach cancer Sister   . Kidney  disease Sister   . COPD Brother   . Lung cancer Brother   . Diabetes Brother    Family Psychiatric  History: See H&P Social History:  Social History   Substance and Sexual Activity  Alcohol Use No  . Alcohol/week: 0.0 oz     Social History   Substance and Sexual Activity  Drug Use No    Social History   Socioeconomic History  . Marital status: Single    Spouse name: None  . Number of children: None  . Years of education: None  . Highest education level: None  Social Needs  . Financial resource strain: Not hard at all  . Food insecurity - worry: Never true  . Food insecurity - inability: Never true   . Transportation needs - medical: No  . Transportation needs - non-medical: No  Occupational History  . None  Tobacco Use  . Smoking status: Never Smoker  . Smokeless tobacco: Never Used  Substance and Sexual Activity  . Alcohol use: No    Alcohol/week: 0.0 oz  . Drug use: No  . Sexual activity: No  Other Topics Concern  . None  Social History Narrative  . None   Additional Social History:                         Sleep: Good  Appetite:  Fair  Current Medications: Current Facility-Administered Medications  Medication Dose Route Frequency Provider Last Rate Last Dose  . alum & mag hydroxide-simeth (MAALOX/MYLANTA) 200-200-20 MG/5ML suspension 30 mL  30 mL Oral Q4H PRN [name], [name], MD      . aspirin EC tablet 81 mg  81 mg Oral QAC breakfast [name], [name], MD   81 mg at 11/05/17 0831  . atenolol (TENORMIN) tablet 100 mg  100 mg Oral Daily [name], [name], MD   100 mg at 11/05/17 0831  . benztropine (COGENTIN) tablet 0.5 mg  0.5 mg Oral QHS [name], [name], MD   0.5 mg at 11/05/17 2124  . haloperidol (HALDOL) tablet 5 mg  5 mg Oral QHS Shamaya Kauer, Tyson Babinski, MD   5 mg at 11/05/17 2125  . [START ON 12/02/2017] haloperidol decanoate (HALDOL DECANOATE) 100 MG/ML injection 50 mg  50 mg Intramuscular Once Osa Fogarty, Tyson Babinski, MD      . hydrochlorothiazide (HYDRODIURIL) tablet 25 mg  25 mg Oral Daily [name], [name], MD   25 mg at 11/05/17 0830  . hydrOXYzine (ATARAX/VISTARIL) tablet 25 mg  25 mg Oral TID PRN [name], [name], MD      . iopamidol (ISOVUE-300) 61 % injection 75 mL  75 mL Intravenous Once PRN Keelyn Fjelstad R, MD      . lisinopril (PRINIVIL,ZESTRIL) tablet 20 mg  20 mg Oral Daily [name], [name], MD   20 mg at 11/05/17 0831  . magnesium hydroxide (MILK OF MAGNESIA) suspension 30 mL  30 mL Oral Daily PRN [name], [name], MD      . metFORMIN (GLUCOPHAGE) tablet 500 mg  500 mg Oral BID WC [name], [name], MD   500 mg at 11/05/17 0831  . pantoprazole (PROTONIX) EC tablet 40  mg  40 mg Oral Daily [name], [name], MD   40 mg at 11/05/17 0831  . potassium chloride SA (K-DUR,KLOR-CON) CR tablet 20 mEq  20 mEq Oral Once Muntaha Vermette, Tyson Babinski, MD      . QUEtiapine (SEROQUEL) tablet 25 mg  25 mg Oral QHS  PRN Danil Wedge, Tyson Babinski, MD      . rosuvastatin (CRESTOR) tablet 40 mg  40 mg Oral q1800 [name], [name], MD   40 mg at 11/04/17 1756    Lab Results:  Results for orders placed or performed during the hospital encounter of 11/04/17 (from the past 48 hour(s))  Glucose, capillary     Status: Abnormal   Collection Time: 11/04/17  4:45 PM  Result Value Ref Range   Glucose-Capillary 120 (H) 65 - 99 mg/dL   Comment 1 Notify RN   Hemoglobin A1c     Status: Abnormal   Collection Time: 11/05/17  6:52 AM  Result Value Ref Range   Hgb A1c MFr Bld 6.2 (H) 4.8 - 5.6 %    Comment: (NOTE) Pre diabetes:          5.7%-6.4% Diabetes:              >6.4% Glycemic control for   <7.0% adults with diabetes    Mean Plasma Glucose 131.24 mg/dL    Comment: Performed at [facility], [place] [address], [place], [place] [address]  Lipid panel     Status: Abnormal   Collection Time: 11/05/17  6:52 AM  Result Value Ref Range   Cholesterol 217 (H) 0 - 200 mg/dL   Triglycerides 102 <150 mg/dL   HDL 34 (L) >40 mg/dL   Total CHOL/HDL Ratio 6.4 RATIO   VLDL 20 0 - 40 mg/dL   LDL Cholesterol 163 (H) 0 - 99 mg/dL    Comment:        Total Cholesterol/HDL:CHD Risk Coronary Heart Disease Risk Table                     Men   Women  1/2 Average Risk   3.4   3.3  Average Risk       5.0   4.4  2 X Average Risk   9.6   7.1  3 X Average Risk  23.4   11.0        Use the calculated Patient Ratio above and the CHD Risk Table to determine the patient's CHD Risk.        ATP III CLASSIFICATION (LDL):  <100     mg/dL   Optimal  100-129  mg/dL   Near or Above                    Optimal  130-159  mg/dL   Borderline  160-189  mg/dL   High  >190     mg/dL   Very High Performed at [facility],  [address] [place], [place] [address]   TSH     Status: None   Collection Time: 11/05/17  6:52 AM  Result Value Ref Range   TSH 0.942 0.350 - 4.500 uIU/mL    Comment: Performed by a 3rd Generation assay with a functional sensitivity of <=0.01 uIU/mL. Performed at [facility], [address] [place], [place] [address]     Blood Alcohol level:  Lab Results  Component Value Date   [facility] <10 11/03/2017   ETH <10 22/48/2500    Metabolic Disorder Labs: Lab Results  Component Value Date   HGBA1C 6.2 (H) 11/05/2017   MPG 131.24 11/05/2017   MPG 131.24 09/09/2017   No results found for: PROLACTIN Lab Results  Component Value Date   CHOL 217 (H) 11/05/2017   TRIG 102 11/05/2017   HDL 34 (  L) 11/05/2017   CHOLHDL 6.4 11/05/2017   VLDL 20 11/05/2017   LDLCALC 163 (H) 11/05/2017   LDLCALC 153 (H) 09/09/2017    Physical Findings: AIMS:  , ,  ,  ,    CIWA:    COWS:     Musculoskeletal: Strength & Muscle Tone: within normal limits Gait & Station: normal Patient leans: N/A  Psychiatric Specialty Exam: Physical Exam  Nursing note and vitals reviewed.   ROS  Blood pressure 116/68, pulse 82, temperature 98.4 F (36.9 C), temperature source Oral, resp. rate 18, height 5' 1.81" (1.57 m), weight 102.5 kg (226 lb), SpO2 100 %.Body mass index is 41.59 kg/m.  General Appearance: Casual  Eye Contact:  Fair  Speech:  stuttering  Volume:  Normal  Mood:  Irritable  Affect:  Appropriate  Thought Process:  Coherent and Goal Directed  Orientation:  Full (Time, Place, and Person)  Thought Content:  Illogical at times, but overall logical and appropriate  Suicidal Thoughts:  No  Homicidal Thoughts:  No  Memory:  Immediate;   Fair  Judgement:  Impaired  Insight:  Lacking  Psychomotor Activity:  Normal  Concentration:  Concentration: Poor  Recall:  Poor  Fund of Knowledge:  Fair  Language:  Fair  Akathisia:  No      Assets:  Resilience  ADL's:  Intact   Cognition:  WNL  Sleep:  Number of Hours: 5.45     Treatment Plan Summary: 67 yo female admitted for psychosis and decompensation in the setting of medication noncompliance. She is less irritable today. She is still paranoid and refusing certain medications. She is also paranoid about this provider examining her thinking that the devil will be transferred to her. She is compliant with Haldol.  Plan:  Schizophrenia -Continue oral Haldol 5 mg. Will give additional 50 mg haldol Decanoate injection tomorrow -She received Haldol dec 50 mg on 2/26  HTN -Pt refusing her medications. Vitals look good today  DM -Pt refusing Metformin  History of meningioma -CT head done today. Possible recurrence of meningioma. No mass effect. Appears stable. Discussed case with on call neurosurgeon, Dr. Novella Olive. He took a look at the scan and feels there is possible recurrence and can be followed up with as an outpatient. She can be seen in the neurosurgery clinic at [facility]. Will have CSW make appointment prior to discharge  Dispo -Pt lives alone in house. She has supportive family.    Marylin Crosby, MD 11/06/2017, 11:23 AM

## 2017-11-06 NOTE — Plan of Care (Signed)
Patient slept for Estimated Hours of 5.45; Precautionary checks every 15 minutes for safety maintained, room free of safety hazards, patient sustains no injury or falls during this shift.

## 2017-11-06 NOTE — B[facility] Group Notes (Signed)
  11/06/2017 9am  Type of Therapy and Topic: Group Therapy: Goals Group: SMART Goals   Participation Level: Active  Description of Group:    The purpose of a daily goals group is to assist and guide patients in setting recovery/wellness-related goals. The objective is to set goals as they relate to the crisis in which they were admitted. Patients will be using SMART goal modalities to set measurable goals. Characteristics of realistic goals will be discussed and patients will be assisted in setting and processing how one will reach their goal. Facilitator will also assist patients in applying interventions and coping skills learned in psycho-education groups to the SMART goal and process how one will achieve defined goal.   Therapeutic Goals:   -Patients will develop and document one goal related to or their crisis in which brought them into treatment.  -Patients will be guided by LCSW using SMART goal setting modality in how to set a measurable, attainable, realistic and time sensitive goal.  -Patients will process barriers in reaching goal.  -Patients will process interventions in how to overcome and successful in reaching goal.   Patient's Goal: "To decrease my anger in the next few weeks."  Therapeutic Modalities:  Motivational Interviewing  Cognitive Behavioral Therapy  Crisis Intervention Model  SMART goals setting  Darin Engels, Marlinda Mike 11/06/2017 9:38 AM

## 2017-11-06 NOTE — Plan of Care (Signed)
Patient could not verbalize understanding of  information received   Some  evidence  of use of coping skills  observed . No safety concerns voiced.

## 2017-11-06 NOTE — Progress Notes (Signed)
Patient up ad lib ambulating the unit. Refused all morning medications after nurse explained what each one was for. Patient began to smell the Metformin and sated, "This doesn't smell like my Metformin, I am not taking that. " Nurse explained to patient that the medication from  is from a different company but it was the exact same medication. Patient still refused to take her medication. Patient spoke with MD this morning and requested to have a scan of her head done. An order for a CT scan W/WO contrast was ordered. When patient got to the have the procedure done the patient refused to allow them to insert an IV for the contrast. Scan without contrast done only. Patient is compliant with meals and minimally interacts with peers in the milieu. Will continue to monitor.

## 2017-11-06 NOTE — [facility] Group Notes (Signed)
11/06/2017  Time: 1:00PM  Type of Therapy/Topic:  Group Therapy:  Balance in Life  Participation Level:  Did Not Attend  Description of Group:   This group will address the concept of balance and how it feels and looks when one is unbalanced. Patients will be encouraged to process areas in their lives that are out of balance and identify reasons for remaining unbalanced. Facilitators will guide patients in utilizing problem-solving interventions to address and correct the stressor making their life unbalanced. Understanding and applying boundaries will be explored and addressed for obtaining and maintaining a balanced life. Patients will be encouraged to explore ways to assertively make their unbalanced needs known to significant others in their lives, using other group members and facilitator for support and feedback.  Therapeutic Goals: 1. Patient will identify two or more emotions or situations they have that consume much of in their lives. 2. Patient will identify signs/triggers that life has become out of balance:  3. Patient will identify two ways to set boundaries in order to achieve balance in their lives:  4. Patient will demonstrate ability to communicate their needs through discussion and/or role plays  Summary of Patient Progress: Pt was invited to attend group but chose not to attend. CSW will continue to encourage pt to attend group throughout their admission.   Therapeutic Modalities:   Cognitive Behavioral Therapy Solution-Focused Therapy Assertiveness Training  Alden Hipp, MSW, LCSW 11/06/2017 1:53 PM

## 2017-11-07 ENCOUNTER — Telehealth: Payer: Self-pay | Admitting: Family Medicine

## 2017-11-07 NOTE — Plan of Care (Signed)
Patient refused all morning medications, ate breakfast in the dayroom. Affect irritated, not very pleasant when interacting with staff.

## 2017-11-07 NOTE — Progress Notes (Signed)
[facility] MD Progress Note  11/07/2017 3:27 PM Lauren [name]  MRN:  [id number] Subjective:  Pt is doing better today. She is less irritable and stuttering less. She states that she has been sleeping much better and getting good rest. She states that she is glad that she is only on  Haldol and Cogentin and is adamant that these are the only ones she will take. We discussed that I recommend her to get an additional Haldol dec injection so she doesn't have to take oral pills. She is adamant that she does not want a higher dose of Haldol injection and assures me taht she will continue to take the oral pill. She states, "I'm going to get a pill box when I leave to put all my medications in." She has a very rational and appropriate conversation with me about her history and that haldol has worked for her for many years and does not want to change it. We discussed taht she will need a neurosurgery appointment and she will follow up with it if we make an appointment. She is oriented to person, place and time. She is overall much more organized in conversation. Still with some residual paranoia but overall much improved.   Principal Problem: Schizophrenia ([place]) Diagnosis:   Patient Active Problem List   Diagnosis Date Noted  . Schizophrenia ([place]) [F20.9] 11/04/2017    Priority: High  . Acid reflux [K21.9] 06/29/2015  . Diabetes mellitus type 2, controlled, without complications ([place]) [P37.9] 06/29/2015  . Cardiac murmur [R01.1] 06/29/2015  . Arthritis of knee, degenerative [M17.10] 06/28/2015  . Insomnia w/ sleep apnea [G47.00, G47.30] 03/21/2015  . Anxiety disorder [F41.9] 03/21/2015  . Hypertension [I10] 03/21/2015  . HLD (hyperlipidemia) [E78.5] 03/21/2015  . Urge incontinence [N39.41] 03/21/2015   Total Time spent with patient: 20 minutes  Past Psychiatric History: See H&P  Past Medical History:  Past Medical History:  Diagnosis Date  . Anxiety   . Apnea, sleep 02/02/2014  . Awareness of  heartbeats 02/02/2014  . Breathlessness on exertion 02/02/2014  . Diabetes mellitus   . Encounter for pre-employment examination 06/29/2015  . Excessive sweating 07/05/2015  . Fibromyalgia   . GERD (gastroesophageal reflux disease)   . Gravida 1 10/26/2015   1.    . Heart murmur   . Herniated disc   . Hyperlipidemia   . Hypertension   . Itch of skin 10/26/2015  . Lack of bladder control   . Lung tumor   . Rheumatoid arthritis ([place])   . Schizoaffective disorder ([place])   . Screening for cervical cancer 07/29/2017  . Seizure [facility])    after brain surgery 2012. last seizure 2013!  [name] Sex counseling 10/26/2015  . Sleep apnea   . 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 Stiffness of both knees 06/22/2015    Past Surgical History:  Procedure Laterality Date  . BRAIN TUMOR EXCISION  2012   benign  . CARDIAC CATHETERIZATION  2008  . CESAREAN SECTION    . CYST REMOVAL HAND    . LUNG LOBECTOMY  1977   benign tumor  . 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 Heart disease Brother   . Depression Mother   . Heart attack Mother   . Stroke Mother   .  Alcohol abuse Father   . Stroke Father   . Diabetes Sister   . Diabetes Sister   . Stomach cancer Sister   . Kidney disease Sister   . COPD Brother   . Lung cancer Brother   . Diabetes Brother    Family Psychiatric  History: See H&P Social History:  Social History   Substance and Sexual Activity  Alcohol Use No  . Alcohol/week: 0.0 oz     Social History   Substance and Sexual Activity  Drug Use No    Social History   Socioeconomic History  . Marital status: Single    Spouse name: None  . Number of children: None  . Years of education: None  . Highest education level: None  Social Needs  . Financial  resource strain: Not hard at all  . Food insecurity - worry: Never true  . Food insecurity - inability: Never true  . Transportation needs - medical: No  . Transportation needs - non-medical: No  Occupational History  . None  Tobacco Use  . Smoking status: Never Smoker  . Smokeless tobacco: Never Used  Substance and Sexual Activity  . Alcohol use: No    Alcohol/week: 0.0 oz  . Drug use: No  . Sexual activity: No  Other Topics Concern  . None  Social History Narrative  . None   Additional Social History:                         Sleep: Good  Appetite:  Good  Current Medications: Current Facility-Administered Medications  Medication Dose Route Frequency Provider Last Rate Last Dose  . alum & mag hydroxide-simeth (MAALOX/MYLANTA) 200-200-20 MG/5ML suspension 30 mL  30 mL Oral Q4H PRN [name], [name], MD      . aspirin EC tablet 81 mg  81 mg Oral QAC breakfast [name], [name], MD   81 mg at 11/05/17 0831  . atenolol (TENORMIN) tablet 100 mg  100 mg Oral Daily [name], [name], MD   100 mg at 11/05/17 0831  . benztropine (COGENTIN) tablet 0.5 mg  0.5 mg Oral QHS [name], [name], MD   0.5 mg at 11/06/17 2307  . haloperidol (HALDOL) tablet 5 mg  5 mg Oral QHS Luisenrique Conran, Tyson Babinski, MD   5 mg at 11/06/17 2307  . [START ON 12/02/2017] haloperidol decanoate (HALDOL DECANOATE) 100 MG/ML injection 50 mg  50 mg Intramuscular Once Tamyrah Burbage R, MD      . hydrochlorothiazide (HYDRODIURIL) tablet 25 mg  25 mg Oral Daily [name], [name], MD   25 mg at 11/05/17 0830  . hydrOXYzine (ATARAX/VISTARIL) tablet 25 mg  25 mg Oral TID PRN [name], [name], MD      . lisinopril (PRINIVIL,ZESTRIL) tablet 20 mg  20 mg Oral Daily [name], [name], MD   20 mg at 11/05/17 0831  . magnesium hydroxide (MILK OF MAGNESIA) suspension 30 mL  30 mL Oral Daily PRN [name], [name], MD      . metFORMIN (GLUCOPHAGE) tablet 500 mg  500 mg Oral BID WC [name], [name], MD   500 mg at 11/05/17 0831  . pantoprazole  (PROTONIX) EC tablet 40 mg  40 mg Oral Daily [name], [name], MD   40 mg at 11/05/17 0831  . potassium chloride SA (K-DUR,KLOR-CON) CR tablet 20 mEq  20 mEq Oral Once Obi Scrima, Tyson Babinski, MD      . QUEtiapine (SEROQUEL) tablet 25 mg  25 mg Oral QHS PRN Veyda Kaufman, Tyson Babinski, MD      . rosuvastatin (CRESTOR) tablet 40 mg  40 mg Oral q1800 [name], [name], MD   40 mg at 11/04/17 1756    Lab Results: No results found for this or any previous visit (from the past 48 hour(s)).  Blood Alcohol level:  Lab Results  Component Value Date   ETH <10 11/03/2017   ETH <10 78/24/2353    Metabolic Disorder Labs: Lab Results  Component Value Date   HGBA1C 6.2 (H) 11/05/2017   MPG 131.24 11/05/2017   MPG 131.24 09/09/2017   No results found for: PROLACTIN Lab Results  Component Value Date   CHOL 217 (H) 11/05/2017   TRIG 102 11/05/2017   HDL 34 (L) 11/05/2017   CHOLHDL 6.4 11/05/2017   VLDL 20 11/05/2017   LDLCALC 163 (H) 11/05/2017   LDLCALC 153 (H) 09/09/2017    Physical Findings: AIMS:  , ,  ,  ,    CIWA:    COWS:     Musculoskeletal: Strength & Muscle Tone: within normal limits Gait & Station: normal Patient leans: N/A  Psychiatric Specialty Exam: Physical Exam  Nursing note and vitals reviewed. Denies any muscle stiffness. She refuses physical exam to exam stiffness or cogwheeling in extremities. NO tremor noted  Review of Systems  All other systems reviewed and are negative.   Blood pressure 93/65, pulse 88, temperature 98.6 F (37 C), temperature source Oral, resp. rate 18, height 5' 1.81" (1.57 m), weight 102.5 kg (226 lb), SpO2 97 %.Body mass index is 41.59 kg/m.  General Appearance: Casual  Eye Contact:  Fair  Speech:  Clear and Coherent, some stuttering but much improved  Volume:  Normal  Mood:  Euthymic  Affect:  Appropriate  Thought Process:  Coherent  Orientation:  Full (Time, Place, and Person)  Thought Content:  Logical  Suicidal Thoughts:  No  Homicidal Thoughts:   No  Memory:  Immediate;   Fair  Judgement:  Impaired  Insight:  Lacking  Psychomotor Activity:  Normal  Concentration:  Concentration: Fair  Recall:  [employer] of Knowledge:  Fair  Language:  Fair  Akathisia:  No      Assets:  Resilience  ADL's:  Intact  Cognition:  WNL  Sleep:  Number of Hours: 7.15     Treatment Plan Summary:  67 yo female admitted for psychosis and decompensation in the setting of medication noncompliance. She is less irritable today. Still with some paranoid but overall improving. She was given Haldol dec 50 mg in ED. Plan was to give additional 50 mg as she likely needs higher dose.  She adamantly refuses another Haldol injection and wants to continue oral pill. She is doing much better and no indication to force medications at this time.   Plan:  Schizophrenia  -Continue oral Haldol 5 mg daily -She received Haldol Dec 50 mg on 2/26  HTN -Pt refusing BP medications. BP is on the lower side today. Will d/c anti-hypertensives  DM -Pt refusing Metformin  History of meningioma -CT head done yesterday. Possible recurrence of meningioma. No mass effect. Appears stable. Discussed case with on call neurosurgeon, Dr. Novella Olive. He took a look at the scan and feels there is possible recurrence and can be followed up with as an outpatient. She can be seen in the neurosurgery clinic at [facility]. Will have CSW make appointment prior to discharge  Dispo -Pt lives alone in house. She has  supportive family.   Marylin Crosby, MD 11/07/2017, 3:28 PM

## 2017-11-07 NOTE — [facility] Group Notes (Signed)
LCSW Group Therapy Note  11/07/2017 1:00 pm  Type of Therapy and Topic:  Group Therapy:  Feelings around Relapse and Recovery  Participation Level:  Active   Description of Group:    Patients in this group will discuss emotions they experience before and after a relapse. They will process how experiencing these feelings, or avoidance of experiencing them, relates to having a relapse. Facilitator will guide patients to explore emotions they have related to recovery. Patients will be encouraged to process which emotions are more powerful. They will be guided to discuss the emotional reaction significant others in their lives may have to their relapse or recovery. Patients will be assisted in exploring ways to respond to the emotions of others without this contributing to a relapse.  Therapeutic Goals: 1. Patient will identify two or more emotions that lead to a relapse for them 2. Patient will identify two emotions that result when they relapse 3. Patient will identify two emotions related to recovery 4. Patient will demonstrate ability to communicate their needs through discussion and/or role plays   Summary of Patient Progress: Lauren [name] was able to actively participate in today's group.  She shared the emotions that she has experienced before and after a relapse to include happiness and anger.  Lauren [name] shared that she has recently felt rage since this last relapse and she has not been able to understand why she is feeling this way.  Lauren [name] shared that her sister has been very supportive of her and her emotion has been one of love.  Lauren [name] was able to explore ways in which she could respond to her emotion of her sister to help her to not have as many relapses or as severe relapses.    Therapeutic Modalities:   Cognitive Behavioral Therapy Solution-Focused Therapy Assertiveness Training Relapse Prevention Therapy   Devona Konig, [place] 11/07/2017 3:40 PM

## 2017-11-07 NOTE — Plan of Care (Signed)
Stayed in room but presented to the medication room. Was able to talk to staff during medication time

## 2017-11-07 NOTE — Progress Notes (Signed)
Patient stayed in her room, calm and cooperative. Did not participate in evening activities. Presented to the medication room and received her bedtime medication earlier than scheduled, reporting that she wanted to go to sleep. Knowledgeable of her medication regime. Patient denied thoughts of self harm. Denied hallucinations but paranoid about medications. Patient returned to bed and currently resting. Staff continue to monitor.

## 2017-11-07 NOTE — [facility] Group Notes (Signed)
[facility] Group Notes:  (Nursing/MHT/Case Management/Adjunct)  Date:  11/07/2017  Time:  3:30 PM  Type of Therapy:  Psychoeducational Skills  Participation Level:  Did Not Attend  Adela Lank [facility] 11/07/2017, 3:30 PM

## 2017-11-07 NOTE — Telephone Encounter (Signed)
Called  [phone] @ 4:52pm. Was not able to leave a message voice mail did not pick up. Pt is needing to schedule an appt with Dr Ancil Boozer

## 2017-11-07 NOTE — Telephone Encounter (Signed)
Please call patient to schedule follow up with new PCP Dr. Ancil Boozer

## 2017-11-07 NOTE — Progress Notes (Signed)
Patient in her room lying in bed awake this morning upon my arrival to the unit. Informed patient that her breakfast was in the dayroom. Patient also informed of the medication that she would be taking this morning and patient replied. "I won't be taking any of those medicines ma'am." Patient encouraged to participate in groups today, patient agreed to do so. Denies SI/HI/AVH and pain. Milieu remains safe with q 15 minute checks.

## 2017-11-07 NOTE — Plan of Care (Signed)
Patient took her medicines , appear calm and cooperative with care of ADLs, patient is encouraged to be out of her room more often to socialize with peers, educate patient about 15 minute rounds for safety. Patient denies any SI/HI and no signs of AVH, no respiratory distress. Progressing Activity: Will identify at least one activity in which they can participate 11/07/2017 0052 - Progressing by Clemens Catholic, RN Coping: Participation in decision-making will improve 11/07/2017 0052 - Progressing by Clemens Catholic, RN Ability to use eye contact when communicating with others will improve 11/07/2017 0052 - Progressing by Clemens Catholic, RN Self-Concept: Ability to verbalize positive feelings about self will improve 11/07/2017 0052 - Progressing by Clemens Catholic, RN

## 2017-11-08 NOTE — Progress Notes (Signed)
[facility] MD Progress Note  11/08/2017 6:48 PM Lauren [name]  MRN:  [id number]    Subjective:    The patient reports that she slept better last night. She is still refusing all medications other than Haldol and Cogentin. She is not taking blood pressure medications. Vital signs however have been stable. Per nursing, she slept 8 hours last night. The patient continues to have some paranoid and delusional thoughts about staff and her medications. She does not trust "anyone". However, from [facility] admission, she appears to improve according to the chart. She has gone to some groups and is interacting with staff appropriately. She is fully alert and oriented and thought processes are fairly well organized. She denies any auditory or visual hallucinations. She denies any current active or passive suicidal thoughts. Appetite is good.   Past Psychiatric History: History of schizophrenia. Last admission was in January and discharged 09/19/17 on Haldol dec and Zyprexa. She has done well on Haldol for many years.    Principal Problem: Schizophrenia ([place]) Diagnosis:   Patient Active Problem List   Diagnosis Date Noted  . Schizophrenia ([place]) [F20.9] 11/04/2017  . Acid reflux [K21.9] 06/29/2015  . Diabetes mellitus type 2, controlled, without complications ([place]) [H88.5] 06/29/2015  . Cardiac murmur [R01.1] 06/29/2015  . Arthritis of knee, degenerative [M17.10] 06/28/2015  . Insomnia w/ sleep apnea [G47.00, G47.30] 03/21/2015  . Anxiety disorder [F41.9] 03/21/2015  . Hypertension [I10] 03/21/2015  . HLD (hyperlipidemia) [E78.5] 03/21/2015  . Urge incontinence [N39.41] 03/21/2015   Total Time spent with patient: 20 minutes    Past Medical History:  Past Medical History:  Diagnosis Date  . Anxiety   . Apnea, sleep 02/02/2014  . Awareness of heartbeats 02/02/2014  . Breathlessness on exertion 02/02/2014  . Diabetes mellitus   . Encounter for pre-employment examination 06/29/2015  . Excessive  sweating 07/05/2015  . Fibromyalgia   . GERD (gastroesophageal reflux disease)   . Gravida 1 10/26/2015   1.    . Heart murmur   . Herniated disc   . Hyperlipidemia   . Hypertension   . Itch of skin 10/26/2015  . Lack of bladder control   . Lung tumor   . Rheumatoid arthritis ([place])   . Schizoaffective disorder ([place])   . Screening for cervical cancer 07/29/2017  . Seizure [facility])    after brain surgery 2012. last seizure 2013!  [name] Sex counseling 10/26/2015  . Sleep apnea   . 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 Stiffness of both knees 06/22/2015    Past Surgical History:  Procedure Laterality Date  . BRAIN TUMOR EXCISION  2012   benign  . CARDIAC CATHETERIZATION  2008  . CESAREAN SECTION    . CYST REMOVAL HAND    . LUNG LOBECTOMY  1977   benign tumor  . 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 Heart disease Brother   . Depression Mother   . Heart attack Mother   . Stroke Mother   . Alcohol abuse Father   . Stroke Father   . Diabetes Sister   . Diabetes Sister   . Stomach cancer Sister   . Kidney disease Sister   . COPD Brother   . Lung cancer Brother   . Diabetes Brother  Family Psychiatric  History: See H&P Social History:  Social History   Substance and Sexual Activity  Alcohol Use No  . Alcohol/week: 0.0 oz     Social History   Substance and Sexual Activity  Drug Use No    Social History   Socioeconomic History  . Marital status: Single    Spouse name: None  . Number of children: None  . Years of education: None  . Highest education level: None  Social Needs  . Financial resource strain: Not hard at all  . Food insecurity - worry: Never true  . Food insecurity - inability: Never true  . Transportation needs - medical: No   . Transportation needs - non-medical: No  Occupational History  . None  Tobacco Use  . Smoking status: Never Smoker  . Smokeless tobacco: Never Used  Substance and Sexual Activity  . Alcohol use: No    Alcohol/week: 0.0 oz  . Drug use: No  . Sexual activity: No  Other Topics Concern  . None  Social History Narrative  . None        Sleep: Good  Appetite:  Good  Current Medications: Current Facility-Administered Medications  Medication Dose Route Frequency Provider Last Rate Last Dose  . alum & mag hydroxide-simeth (MAALOX/MYLANTA) 200-200-20 MG/5ML suspension 30 mL  30 mL Oral Q4H PRN [name], [name], MD      . aspirin EC tablet 81 mg  81 mg Oral QAC breakfast [name], [name], MD   81 mg at 11/05/17 0831  . benztropine (COGENTIN) tablet 0.5 mg  0.5 mg Oral QHS [name], [name], MD   0.5 mg at 11/07/17 2018  . haloperidol (HALDOL) tablet 5 mg  5 mg Oral QHS McNew, Tyson Babinski, MD   5 mg at 11/07/17 2019  . [START ON 12/02/2017] haloperidol decanoate (HALDOL DECANOATE) 100 MG/ML injection 50 mg  50 mg Intramuscular Once McNew, Tyson Babinski, MD      . hydrOXYzine (ATARAX/VISTARIL) tablet 25 mg  25 mg Oral TID PRN [name], [name], MD      . magnesium hydroxide (MILK OF MAGNESIA) suspension 30 mL  30 mL Oral Daily PRN [name], [name], MD      . metFORMIN (GLUCOPHAGE) tablet 500 mg  500 mg Oral BID WC [name], [name], MD   500 mg at 11/05/17 0831  . pantoprazole (PROTONIX) EC tablet 40 mg  40 mg Oral Daily [name], [name], MD   40 mg at 11/05/17 0831  . potassium chloride SA (K-DUR,KLOR-CON) CR tablet 20 mEq  20 mEq Oral Once McNew, Tyson Babinski, MD      . QUEtiapine (SEROQUEL) tablet 25 mg  25 mg Oral QHS PRN McNew, Tyson Babinski, MD      . rosuvastatin (CRESTOR) tablet 40 mg  40 mg Oral q1800 [name], [name], MD   40 mg at 11/04/17 1756    Lab Results: No results found for this or any previous visit (from the past 48 hour(s)).  Blood Alcohol level:  Lab Results  Component Value Date   ETH <10  11/03/2017   ETH <10 08/65/7846    Metabolic Disorder Labs: Lab Results  Component Value Date   HGBA1C 6.2 (H) 11/05/2017   MPG 131.24 11/05/2017   MPG 131.24 09/09/2017   No results found for: PROLACTIN Lab Results  Component Value Date   CHOL 217 (H) 11/05/2017   TRIG 102 11/05/2017   HDL 34 (L) 11/05/2017   CHOLHDL 6.4 11/05/2017  VLDL 20 11/05/2017   LDLCALC 163 (H) 11/05/2017   LDLCALC 153 (H) 09/09/2017    Physical Findings: AIMS:  , ,  ,  ,    CIWA:    COWS:     Musculoskeletal: Strength & Muscle Tone: within normal limits Gait & Station: normal Patient leans: N/A  Psychiatric Specialty Exam: Physical Exam  Nursing note and vitals reviewed. Denies any muscle stiffness. She refuses physical exam to exam stiffness or cogwheeling in extremities. NO tremor noted  Review of Systems  Constitutional: Negative.   HENT: Negative.   Eyes: Negative.   Respiratory: Negative.   Cardiovascular: Negative.   Gastrointestinal: Negative.   Musculoskeletal: Negative.   Skin: Negative.   Neurological: Negative.     Blood pressure 129/64, pulse 88, temperature 98.2 F (36.8 C), resp. rate 18, height 5' 1.81" (1.57 m), weight 102.5 kg (226 lb), SpO2 97 %.Body mass index is 41.59 kg/m.  General Appearance: Casual  Eye Contact:  Fair  Speech:  Clear and Coherent, some stuttering but much improved  Volume:  Normal  Mood:  Euthymic  Affect:  Appropriate  Thought Process:  Coherent  Orientation:  Full (Time, Place, and Person)  Thought Content:  Logical  Suicidal Thoughts:  No  Homicidal Thoughts:  No  Memory:  Immediate;   Fair  Judgement:  Impaired  Insight:  Lacking  Psychomotor Activity:  Normal  Concentration:  Concentration: Fair  Recall:  [employer] of Knowledge:  Fair  Language:  Fair  Akathisia:  No      Assets:  Resilience  ADL's:  Intact  Cognition:  WNL  Sleep:  Number of Hours: 8     Treatment Plan Summary:  67 yo female admitted for psychosis  and decompensation in the setting of medication noncompliance. She is less irritable today. Still with some paranoid but overall improving. She was given Haldol dec 50 mg in ED. Plan was to give additional 50 mg as she likely needs higher dose.  She adamantly refuses another Haldol injection and wants to continue oral pill. She is doing much better and no indication to force medications at this time.   Plan:  Schizophrenia -Continue oral Haldol 5 mg daily -She received Haldol Dec 50 mg on 2/26  HTN -Pt refusing BP medications. BP is stable.   DM -Pt refusing Metformin  History of meningioma -CT head done yesterday. Possible recurrence of meningioma. No mass effect. Appears stable. Discussed case with on call neurosurgeon, Dr. Novella Olive. He took a look at the scan and feels there is possible recurrence and can be followed up with as an outpatient. She can be seen in the neurosurgery clinic at [facility]. Will have CSW make appointment prior to discharge  Labs: -Total cholesterol was 217 and hemoglobin A1c was 6.2 -EKG showed QTC of 442  Dispo -She will need psychotropic medication management follow-up appointment. Pt lives alone in house. She has supportive family. Chauncey Mann, MD 11/08/2017, 6:48 PM

## 2017-11-08 NOTE — Plan of Care (Signed)
Patient is alert and oriented to self and place. Patient has poor judgement and refuses to take medications. Patient is guarded and paranoid. When asked about medications this morning, patient replied, "I don't trust nobody to here to give me my medications because I had a reaction to some medicines that was given to me here, so no I will not take any." Patient states she is in no pain only stiff knees. Patient is eating meals, but does not participate in groups. Patient isolates to her room when not eating. Patient has to be reminded several times about eating meals before she will leave out of her room to eat. Vitals this morning; BP: 129/64, 88 pulse and 18 respirations. Nurse will continue to monitor. Activity: Will identify at least one activity in which they can participate 11/08/2017 1015 - Not Progressing by Geraldo Docker, RN   Coping: Ability to identify and develop effective coping behavior will improve 11/08/2017 1015 - Not Progressing by Geraldo Docker, RN Ability to interact with others will improve 11/08/2017 1015 - Not Progressing by Geraldo Docker, RN Participation in decision-making will improve 11/08/2017 1015 - Not Progressing by Geraldo Docker, RN Ability to use eye contact when communicating with others will improve 11/08/2017 1015 - Not Progressing by Geraldo Docker, RN   Self-Concept: Ability to verbalize positive feelings about self will improve 11/08/2017 1015 - Not Progressing by Geraldo Docker, RN

## 2017-11-08 NOTE — Plan of Care (Addendum)
Patient found awake in bed upon my arrival. Patient is only visible for snack, does not interact with peers. Patient remains paranoid although pleasant. Patient refused cogentin because the package does not say cogentin. Took only Haldol. Denies pain. Reports eating and voiding adequately. Denies SI, HI, AVH. Reports mood is "okay." Affect is guarded. Compliant with HS medications and staff direction. Q 15 minutes and Fall precautions maintained. Will continue to monitor throughout the shift.  Slept 8 hours. No apparent distress. Will endorse care to oncoming shift.  Progressing Coping: Ability to use eye contact when communicating with others will improve 11/08/2017 2154 - Progressing by Derek Mound, RN Self-Concept: Ability to verbalize positive feelings about self will improve 11/08/2017 2154 - Progressing by Derek Mound, RN   Not Progressing Coping: Ability to interact with others will improve 11/08/2017 2154 - Not Progressing by Derek Mound, RN

## 2017-11-08 NOTE — [facility] Group Notes (Signed)
LCSW Group Therapy Note   11/08/2017 1:15pm   Type of Therapy and Topic:  Group Therapy:  Trust and Honesty  Participation Level:  Active  Description of Group:    In this group patients will be asked to explore the value of being honest.  Patients will be guided to discuss their thoughts, feelings, and behaviors related to honesty and trusting in others. Patients will process together how trust and honesty relate to forming relationships with peers, family members, and self. Each patient will be challenged to identify and express feelings of being vulnerable. Patients will discuss reasons why people are dishonest and identify alternative outcomes if one was truthful (to self or others). This group will be process-oriented, with patients participating in exploration of their own experiences, giving and receiving support, and processing challenge from other group members.   Therapeutic Goals: 1. Patient will identify why honesty is important to relationships and how honesty overall affects relationships.  2. Patient will identify a situation where they lied or were lied too and the  feelings, thought process, and behaviors surrounding the situation 3. Patient will identify the meaning of being vulnerable, how that feels, and how that correlates to being honest with self and others. 4. Patient will identify situations where they could have told the truth, but instead lied and explain reasons of dishonesty.   Summary of Patient Progress: Pt reports that she feel rested today. She reports that she does not trust anyone. Pt reports that she is honest all the time and she only has a few friends. Pt was able to identify why honesty is important to relationships to her. Pt explored her own experiences by providing and receiving feedback and processing challenges from other group members.    Therapeutic Modalities:   Cognitive Behavioral Therapy Solution Focused Therapy Motivational Interviewing Brief  Therapy  Lauren Wassenaar  [name], LCSW 11/08/2017 12:48 PM

## 2017-11-09 NOTE — Progress Notes (Signed)
[facility] MD Progress Note  11/09/2017 5:53 PM Lauren [name]  MRN:  [id number]    Subjective:    The patient did sleep well last night. She refused the Cogentin last night but did take the Haldol. She still refusing all other medications. She does have some mild paranoid thoughts about staff on the unit and medication being but denies any auditory or visual hallucinations. Insight and judgment remain poor. She denies any current active or passive suicidal thoughts and says she is not depressed. The patient says she has "no trust in anyone below her shoe". She feels like she is being uses a [place] pig for medications. The patient did come out for breakfast and lunch but says she does not want dinner. Vital signs are stable. She denies any adverse side effects associated with the Haldol.  Past Psychiatric History: History of schizophrenia. Last admission was in January and discharged 09/19/17 on Haldol dec and Zyprexa. She has done well on Haldol for many years.    Principal Problem: Schizophrenia ([place]) Diagnosis:   Patient Active Problem List   Diagnosis Date Noted  . Schizophrenia ([place]) [F20.9] 11/04/2017  . Acid reflux [K21.9] 06/29/2015  . Diabetes mellitus type 2, controlled, without complications ([place]) [D66.4] 06/29/2015  . Cardiac murmur [R01.1] 06/29/2015  . Arthritis of knee, degenerative [M17.10] 06/28/2015  . Insomnia w/ sleep apnea [G47.00, G47.30] 03/21/2015  . Anxiety disorder [F41.9] 03/21/2015  . Hypertension [I10] 03/21/2015  . HLD (hyperlipidemia) [E78.5] 03/21/2015  . Urge incontinence [N39.41] 03/21/2015   Total Time spent with patient: 20 minutes    Past Medical History:  Past Medical History:  Diagnosis Date  . Anxiety   . Apnea, sleep 02/02/2014  . Awareness of heartbeats 02/02/2014  . Breathlessness on exertion 02/02/2014  . Diabetes mellitus   . Encounter for pre-employment examination 06/29/2015  . Excessive sweating 07/05/2015  . Fibromyalgia   .  GERD (gastroesophageal reflux disease)   . Gravida 1 10/26/2015   1.    . Heart murmur   . Herniated disc   . Hyperlipidemia   . Hypertension   . Itch of skin 10/26/2015  . Lack of bladder control   . Lung tumor   . Rheumatoid arthritis ([place])   . Schizoaffective disorder ([place])   . Screening for cervical cancer 07/29/2017  . Seizure [facility])    after brain surgery 2012. last seizure 2013!  [name] Sex counseling 10/26/2015  . Sleep apnea   . 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 Stiffness of both knees 06/22/2015    Past Surgical History:  Procedure Laterality Date  . BRAIN TUMOR EXCISION  2012   benign  . CARDIAC CATHETERIZATION  2008  . CESAREAN SECTION    . CYST REMOVAL HAND    . LUNG LOBECTOMY  1977   benign tumor  . 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 Heart disease Brother   . Depression Mother   . Heart attack Mother   . Stroke Mother   . Alcohol abuse Father   . Stroke Father   . Diabetes Sister   . Diabetes Sister   . Stomach cancer Sister   . Kidney disease Sister   . COPD Brother   . Lung cancer Brother   . Diabetes Brother  Family Psychiatric  History: See H&P Social History:  Social History   Substance and Sexual Activity  Alcohol Use No  . Alcohol/week: 0.0 oz     Social History   Substance and Sexual Activity  Drug Use No    Social History   Socioeconomic History  . Marital status: Single    Spouse name: None  . Number of children: None  . Years of education: None  . Highest education level: None  Social Needs  . Financial resource strain: Not hard at all  . Food insecurity - worry: Never true  . Food insecurity - inability: Never true  . Transportation needs - medical: No  . Transportation needs - non-medical: No   Occupational History  . None  Tobacco Use  . Smoking status: Never Smoker  . Smokeless tobacco: Never Used  Substance and Sexual Activity  . Alcohol use: No    Alcohol/week: 0.0 oz  . Drug use: No  . Sexual activity: No  Other Topics Concern  . None  Social History Narrative  . None        Sleep: Good  Appetite:  Good  Current Medications: Current Facility-Administered Medications  Medication Dose Route Frequency Provider Last Rate Last Dose  . alum & mag hydroxide-simeth (MAALOX/MYLANTA) 200-200-20 MG/5ML suspension 30 mL  30 mL Oral Q4H PRN [name], [name], MD      . aspirin EC tablet 81 mg  81 mg Oral QAC breakfast [name], [name], MD   81 mg at 11/05/17 0831  . benztropine (COGENTIN) tablet 0.5 mg  0.5 mg Oral QHS [name], [name], MD   0.5 mg at 11/07/17 2018  . haloperidol (HALDOL) tablet 5 mg  5 mg Oral QHS McNew, Tyson Babinski, MD   5 mg at 11/08/17 2047  . [START ON 12/02/2017] haloperidol decanoate (HALDOL DECANOATE) 100 MG/ML injection 50 mg  50 mg Intramuscular Once McNew, Tyson Babinski, MD      . hydrOXYzine (ATARAX/VISTARIL) tablet 25 mg  25 mg Oral TID PRN [name], [name], MD      . magnesium hydroxide (MILK OF MAGNESIA) suspension 30 mL  30 mL Oral Daily PRN [name], [name], MD      . metFORMIN (GLUCOPHAGE) tablet 500 mg  500 mg Oral BID WC [name], [name], MD   500 mg at 11/05/17 0831  . pantoprazole (PROTONIX) EC tablet 40 mg  40 mg Oral Daily [name], [name], MD   40 mg at 11/05/17 0831  . potassium chloride SA (K-DUR,KLOR-CON) CR tablet 20 mEq  20 mEq Oral Once McNew, Tyson Babinski, MD      . QUEtiapine (SEROQUEL) tablet 25 mg  25 mg Oral QHS PRN McNew, Tyson Babinski, MD      . rosuvastatin (CRESTOR) tablet 40 mg  40 mg Oral q1800 [name], [name], MD   40 mg at 11/04/17 1756    Lab Results: No results found for this or any previous visit (from the past 48 hour(s)).  Blood Alcohol level:  Lab Results  Component Value Date   ETH <10 11/03/2017   ETH <10 15/17/6160     Metabolic Disorder Labs: Lab Results  Component Value Date   HGBA1C 6.2 (H) 11/05/2017   MPG 131.24 11/05/2017   MPG 131.24 09/09/2017   No results found for: PROLACTIN Lab Results  Component Value Date   CHOL 217 (H) 11/05/2017   TRIG 102 11/05/2017   HDL 34 (L) 11/05/2017   CHOLHDL 6.4 11/05/2017  VLDL 20 11/05/2017   LDLCALC 163 (H) 11/05/2017   LDLCALC 153 (H) 09/09/2017     Musculoskeletal: Strength & Muscle Tone: within normal limits Gait & Station: normal Patient leans: N/A  Psychiatric Specialty Exam: Physical Exam  Nursing note and vitals reviewed. Psychiatric: She has a normal mood and affect. Her speech is normal and behavior is normal. Thought content is paranoid and delusional. Cognition and memory are normal.  Judgement and insight are poor  Denies any muscle stiffness. She refuses physical exam to exam stiffness or cogwheeling in extremities. NO tremor noted  Review of Systems  Constitutional: Negative.   HENT: Negative.   Eyes: Negative.   Respiratory: Negative.   Cardiovascular: Negative.   Gastrointestinal: Negative.   Musculoskeletal: Negative.   Skin: Negative.   Neurological: Negative.     Blood pressure 120/84, pulse 88, temperature 98.7 F (37.1 C), temperature source Oral, resp. rate 18, height 5' 1.81" (1.57 m), weight 102.5 kg (226 lb), SpO2 97 %.Body mass index is 41.59 kg/m.  General Appearance: Casual  Eye Contact:  Fair  Speech:  Clear and Coherent, some stuttering but much improved  Volume:  Normal  Mood:  Euthymic  Affect:  Appropriate  Thought Process:  Coherent  Orientation:  Full (Time, Place, and Person)  Thought Content:  Logical  Suicidal Thoughts:  No  Homicidal Thoughts:  No  Memory:  Immediate;   Fair  Judgement:  Impaired  Insight:  Lacking  Psychomotor Activity:  Normal  Concentration:  Concentration: Fair  Recall:  [employer] of Knowledge:  Fair  Language:  Fair  Akathisia:  No      Assets:   Resilience  ADL's:  Intact  Cognition:  WNL  Sleep:  Number of Hours: 8     Treatment Plan Summary:  67 yo female admitted for psychosis and decompensation in the setting of medication noncompliance. She is less irritable today. Still with some paranoid but overall improving. She was given Haldol dec 50 mg in ED. Plan was to give additional 50 mg as she likely needs higher dose.  She adamantly refuses another Haldol injection and wants to continue oral pill. She is doing much better and no indication to force medications at this time.   Plan:  Schizophrenia -Continue oral Haldol 5 mg daily -She received Haldol Dec 50 mg on 2/26 -She refused Cogentin last night  HTN -Pt refusing BP medications. BP is stable.   DM -Pt refusing Metformin  History of meningioma -CT head done yesterday. Possible recurrence of meningioma. No mass effect. Appears stable. Discussed case with on call neurosurgeon, Dr. Novella Olive. He took a look at the scan and feels there is possible recurrence and can be followed up with as an outpatient. She can be seen in the neurosurgery clinic at [facility]. Will have CSW make appointment prior to discharge  Labs: -Total cholesterol was 217 and hemoglobin A1c was 6.2 -EKG showed QTC of 442  Dispo -She will need psychotropic medication management follow-up appointment. Pt lives alone in house. She has supportive family. Chauncey Mann, MD 11/09/2017, 5:53 PM

## 2017-11-09 NOTE — [facility] Group Notes (Signed)
LCSW Group Therapy Note 11/09/2017 1:15pm  Type of Therapy and Topic: Group Therapy: Feelings Around Returning Home & Establishing a Supportive Framework and Supporting Oneself When Supports Not Available  Participation Level: Did Not Attend  Description of Group:  Patients first processed thoughts and feelings about upcoming discharge. These included fears of upcoming changes, lack of change, new living environments, judgements and expectations from others and overall stigma of mental health issues. The group then discussed the definition of a supportive framework, what that looks and feels like, and how do to discern it from an unhealthy non-supportive network. The group identified different types of supports as well as what to do when your family/friends are less than helpful or unavailable  Therapeutic Goals  1. Patient will identify one healthy supportive network that they can use at discharge. 2. Patient will identify one factor of a supportive framework and how to tell it from an unhealthy network. 3. Patient able to identify one coping skill to use when they do not have positive supports from others. 4. Patient will demonstrate ability to communicate their needs through discussion and/or role plays.  Summary of Patient Progress:     Therapeutic Modalities Cognitive Behavioral Therapy Motivational Interviewing   Lauren [name]  [name], LCSW 11/09/2017 10:31 AM

## 2017-11-09 NOTE — Plan of Care (Signed)
Patient is alert and oriented X 4. Denies SI, HI and AVH but remains very paranoid. Patient refuses to take any medication today stating once again she becomes very sick when she takes medication here. Patient did attend group today; eating meals but continues to remain in her room when not eating. Patient is pleasant but remains apprehensive with staff. Nurse will continue to monitor. 15 minute checks Activity: Will identify at least one activity in which they can participate 11/09/2017 1503 - Progressing by Geraldo Docker, RN   Coping: Ability to identify and develop effective coping behavior will improve 11/09/2017 1503 - Progressing by Geraldo Docker, RN Ability to interact with others will improve 11/09/2017 1503 - Progressing by Geraldo Docker, RN Participation in decision-making will improve 11/09/2017 1503 - Progressing by Geraldo Docker, RN Ability to use eye contact when communicating with others will improve 11/09/2017 1503 - Progressing by Geraldo Docker, RN

## 2017-11-10 MED ORDER — HALOPERIDOL 5 MG PO TABS
10.0000 mg | ORAL_TABLET | Freq: Every day | ORAL | Status: DC
  Administered 2017-11-10 – 2017-11-17 (×8): 10 mg via ORAL
  Filled 2017-11-10 (×8): qty 2

## 2017-11-10 NOTE — Plan of Care (Signed)
Took medications but did not participate in group activities. Guarded, paranoid.

## 2017-11-10 NOTE — Progress Notes (Signed)
Patient stayed in room but came to the medication room. Alert and oriented and denying thoughts of self harm. During conversation with this Probation officer, patient was guarded and irritable. No aggressive behavior displayed.  Was suspicious about medications given to her. Patient reported that she now will be taking her metformin "I have been thinking about it and I...decided to take it. Patient took her bedtime medications and went back to her room. Was encouraged to talk to staff as needed. Currently in bed sleeping. No sign of distress. Staff continue to monitor.
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
## 2017-11-10 NOTE — Tx Team (Signed)
Interdisciplinary Treatment and Diagnostic Plan Update  11/10/2017 Time of Session: 11am Lauren [name] MRN: [id number]  Principal Diagnosis: Schizophrenia [facility])  Secondary Diagnoses: Principal Problem:   Schizophrenia ([place])   Current Medications:  Current Facility-Administered Medications  Medication Dose Route Frequency Provider Last Rate Last Dose  . alum & mag hydroxide-simeth (MAALOX/MYLANTA) 200-200-20 MG/5ML suspension 30 mL  30 mL Oral Q4H PRN [name], [name], MD      . aspirin EC tablet 81 mg  81 mg Oral QAC breakfast [name], [name], MD   81 mg at 11/10/17 7793  . benztropine (COGENTIN) tablet 0.5 mg  0.5 mg Oral QHS [name], [name], MD   0.5 mg at 11/09/17 2147  . haloperidol (HALDOL) tablet 5 mg  5 mg Oral QHS McNew, Tyson Babinski, MD   5 mg at 11/09/17 2147  . [START ON 12/02/2017] haloperidol decanoate (HALDOL DECANOATE) 100 MG/ML injection 50 mg  50 mg Intramuscular Once McNew, Tyson Babinski, MD      . hydrOXYzine (ATARAX/VISTARIL) tablet 25 mg  25 mg Oral TID PRN [name], [name], MD      . magnesium hydroxide (MILK OF MAGNESIA) suspension 30 mL  30 mL Oral Daily PRN [name], [name], MD      . metFORMIN (GLUCOPHAGE) tablet 500 mg  500 mg Oral BID WC [name], [name], MD   500 mg at 11/10/17 9030  . pantoprazole (PROTONIX) EC tablet 40 mg  40 mg Oral Daily [name], [name], MD   40 mg at 11/10/17 0923  . potassium chloride SA (K-DUR,KLOR-CON) CR tablet 20 mEq  20 mEq Oral Once McNew, Tyson Babinski, MD      . QUEtiapine (SEROQUEL) tablet 25 mg  25 mg Oral QHS PRN McNew, Tyson Babinski, MD      . rosuvastatin (CRESTOR) tablet 40 mg  40 mg Oral q1800 [name], [name], MD   40 mg at 11/04/17 1756   PTA Medications: Medications Prior to Admission  Medication Sig Dispense Refill Last Dose  . aspirin EC 81 MG tablet Take 81 mg by mouth daily before breakfast.    unknown  . atenolol (TENORMIN) 100 MG tablet TAKE 1 TABLET BY MOUTH  DAILY 90 tablet 0 unknown  . benztropine (COGENTIN) 0.5 MG tablet  Take 1 tablet (0.5 mg total) by mouth at bedtime. 90 tablet 1 unknown  . Bioflavonoid Products (VITAMIN C) CHEW Chew 1 tablet by mouth daily as needed (for immune system support).   unknown  . Capsaicin (ZOSTRIX EX) Apply 1 application topically 4 (four) times daily as needed (for knee pain.).   unknown  . haloperidol (HALDOL) 10 MG tablet Take 1 tablet (10 mg total) by mouth at bedtime. 90 tablet 1 unknown  . ibuprofen (ADVIL,MOTRIN) 200 MG tablet Take 600 mg by mouth every 8 (eight) hours as needed (for knee pain.).   unknown  . lisinopril-hydrochlorothiazide (PRINZIDE,ZESTORETIC) 20-25 MG tablet TAKE 1 TABLET BY MOUTH  DAILY 90 tablet 0 unknown  . Lotion Base LOTN Apply 1 application topically 4 (four) times daily as needed (for knee pain.). [employer] Essential Lotion (Aloe Vera, [employer], and 6 natural anti-inflammatory essential oils; [place], [place], [place], [place], Peppermint and [place])   unknown  . metFORMIN (GLUCOPHAGE) 500 MG tablet TAKE 1 TABLET BY MOUTH TWICE DAILY WITH A MEAL 60 tablet 0 unknown  . omeprazole (PRILOSEC) 20 MG capsule TAKE 1 CAPSULE BY MOUTH  DAILY 90 capsule 0 unknown  . QUEtiapine (  SEROQUEL) 25 MG tablet Take 1 tablet (25 mg total) by mouth at bedtime as needed. 90 tablet 1 unknown  . rosuvastatin (CRESTOR) 40 MG tablet TAKE 1 TABLET BY MOUTH  DAILY 90 tablet 0 unknown    Patient Stressors: Financial difficulties Health problems Medication change or noncompliance  Patient Strengths: Ability for insight Motivation for treatment/growth Supportive family/friends  Treatment Modalities: Medication Management, Group therapy, Case management,  1 to 1 session with clinician, Psychoeducation, Recreational therapy.   Physician Treatment Plan for Primary Diagnosis: Schizophrenia ([place]) Long Term Goal(s): Improvement in symptoms so as ready for discharge   Short Term Goals: Compliance with prescribed medications will  improve  Medication Management: Evaluate patient's response, side effects, and tolerance of medication regimen.  Therapeutic Interventions: 1 to 1 sessions, Unit Group sessions and Medication administration.  Evaluation of Outcomes: Progressing  Physician Treatment Plan for Secondary Diagnosis: Principal Problem:   Schizophrenia ([place])  Long Term Goal(s): Improvement in symptoms so as ready for discharge   Short Term Goals: Compliance with prescribed medications will improve     Medication Management: Evaluate patient's response, side effects, and tolerance of medication regimen.  Therapeutic Interventions: 1 to 1 sessions, Unit Group sessions and Medication administration.  Evaluation of Outcomes: Progressing   RN Treatment Plan for Primary Diagnosis: Schizophrenia ([place]) Long Term Goal(s): Knowledge of disease and therapeutic regimen to maintain health will improve  Short Term Goals: Ability to verbalize frustration and anger appropriately will improve, Ability to demonstrate self-control, Ability to identify and develop effective coping behaviors will improve and Compliance with prescribed medications will improve  Medication Management: RN will administer medications as ordered by provider, will assess and evaluate patient's response and provide education to patient for prescribed medication. RN will report any adverse and/or side effects to prescribing provider.  Therapeutic Interventions: 1 on 1 counseling sessions, Psychoeducation, Medication administration, Evaluate responses to treatment, Monitor vital signs and CBGs as ordered, Perform/monitor CIWA, COWS, AIMS and Fall Risk screenings as ordered, Perform wound care treatments as ordered.  Evaluation of Outcomes: Progressing   LCSW Treatment Plan for Primary Diagnosis: Schizophrenia ([place]) Long Term Goal(s): Safe transition to appropriate next level of care at discharge, Engage patient in therapeutic group addressing  interpersonal concerns.  Short Term Goals: Engage patient in aftercare planning with referrals and resources, Increase social support, Increase emotional regulation, Facilitate acceptance of mental health diagnosis and concerns, Identify triggers associated with mental health/substance abuse issues and Increase skills for wellness and recovery  Therapeutic Interventions: Assess for all discharge needs, 1 to 1 time with Social worker, Explore available resources and support systems, Assess for adequacy in community support network, Educate family and significant other(s) on suicide prevention, Complete Psychosocial Assessment, Interpersonal group therapy.  Evaluation of Outcomes: Progressing   Progress in Treatment: Attending groups: Yes. Participating in groups: Yes. Taking medication as prescribed: Yes. Toleration medication: Yes. Family/Significant other contact made: yes, Marcial Pacas, Patients sister, [phone] Patient understands diagnosis: No. and As evidenced by:  Confusion as to why she has been admitted Discussing patient identified problems/goals with staff: Yes. Medical problems stabilized or resolved: Yes. Denies suicidal/homicidal ideation: Yes. Issues/concerns per patient self-inventory: No. Other:  New problem(s) identified: No, Describe:  None  New Short Term/Long Term Goal(s): "To get better."  Discharge Plan or Barriers: At discharge, patient will return back to apartment and follow up with her providers in outpatient treatment.   Reason for Continuation of Hospitalization: Delusions  Medication stabilization  Estimated Length of Stay: 4 days  Attendees: Patient:  11/10/2017 11:14 AM  Physician: Dr. Wonda Olds, MD 11/10/2017 11:14 AM  Nursing: Roxy Manns, RN 11/10/2017 11:14 AM  RN Care Manager: 11/10/2017 11:14 AM  Social Worker: Darin Engels, [place] 11/10/2017 11:14 AM  Recreational Therapist:  11/10/2017 11:14 AM  Other: Alden Hipp, LCSW 11/10/2017 11:14 AM   Other:  11/10/2017 11:14 AM  Other: 11/10/2017 11:14 AM    Scribe for Treatment Team: Darin Engels, LCSW 11/10/2017 11:14 AM

## 2017-11-10 NOTE — [facility] Group Notes (Signed)
LCSW Group Therapy Note   11/10/2017 1:15pm   Type of Therapy and Topic:  Group Therapy:  Overcoming Obstacles   Participation Level:  Active   Description of Group:    In this group patients will be encouraged to explore what they see as obstacles to their own wellness and recovery. They will be guided to discuss their thoughts, feelings, and behaviors related to these obstacles. The group will process together ways to cope with barriers, with attention given to specific choices patients can make. Each patient will be challenged to identify changes they are motivated to make in order to overcome their obstacles. This group will be process-oriented, with patients participating in exploration of their own experiences as well as giving and receiving support and challenge from other group members.   Therapeutic Goals: 1. Patient will identify personal and current obstacles as they relate to admission. 2. Patient will identify barriers that currently interfere with their wellness or overcoming obstacles.  3. Patient will identify feelings, thought process and behaviors related to these barriers. 4. Patient will identify two changes they are willing to make to overcome these obstacles:      Summary of Patient Progress Pt participated well in group, contributed to group discussion regarding ways that we can have thoughts or feelings that "get in the way" of our goals.  Shared that she felt the hardest part about obstacles is that they require change.  Supportive of other group members     Therapeutic Modalities:   Cognitive Behavioral Therapy Solution Focused Therapy Motivational Interviewing Relapse Prevention Therapy  [name], LCSW 11/10/2017 4:35 PM

## 2017-11-10 NOTE — Plan of Care (Signed)
Patient progressing in all areas. 

## 2017-11-11 MED ORDER — ATENOLOL 100 MG PO TABS
100.0000 mg | ORAL_TABLET | Freq: Every day | ORAL | Status: DC
  Administered 2017-11-11 – 2017-11-17 (×7): 100 mg via ORAL
  Filled 2017-11-11 (×7): qty 1

## 2017-11-11 NOTE — Progress Notes (Signed)
D- Patient alert and oriented. Patient presents in a pleasant mood on assessment stating that she slept "well, but I woke up with a headache". Patient denies any headache pain on assessment and rates her knee pain a "7/10" stating that she had surgery years ago and feels like the weather is making her knee hurt. Patient did not request any pain medication from this writer. Patient denies SI, HI, AVH, at this time. Patient also denies any signs/symptoms of depression/anxiety at this time. Patient's stated goal for today is to "go to group and learn what I need to in order for me to get out of here".  A- Scheduled medications administered to patient, per MD orders. Support and encouragement provided.  Routine safety checks conducted every 15 minutes.  Patient informed to notify staff with problems or concerns.  R- No adverse drug reactions noted. Patient contracts for safety at this time. Patient compliant with medications and treatment plan. Patient receptive, calm, and cooperative. Patient interacts well with others on the unit.  Patient remains safe at this time.

## 2017-11-11 NOTE — [facility] Group Notes (Signed)
  11/11/2017  Time: 0900  Type of Therapy and Topic:  Group Therapy:  Setting Goals Participation Level:  Did Not Attend  Description of Group: In this process group, patients discussed using strengths to work toward goals and address challenges.  Patients identified two positive things about themselves and one goal they were working on.  Patients were given the opportunity to share openly and support each other's plan for self-empowerment.  The group discussed the value of gratitude and were encouraged to have a daily reflection of positive characteristics or circumstances.  Patients were encouraged to identify a plan to utilize their strengths to work on current challenges and goals.  Therapeutic Goals 1. Patient will verbalize personal strengths/positive qualities and relate how these can assist with achieving desired personal goals 2. Patients will verbalize affirmation of peers plans for personal change and goal setting 3. Patients will explore the value of gratitude and positive focus as related to successful achievement of goals 4. Patients will verbalize a plan for regular reinforcement of personal positive qualities and circumstances.  Summary of Patient Progress: Pt was invited to attend group but chose not to attend. CSW will continue to encourage pt to attend group throughout their admission.    Therapeutic Modalities Cognitive Behavioral Therapy Motivational Interviewing  Alden Hipp, MSW, LCSW 11/11/2017 9:36 AM

## 2017-11-11 NOTE — [facility] Group Notes (Signed)
11/11/2017 1PM  Type of Therapy/Topic:  Group Therapy:  Feelings about Diagnosis  Participation Level:  Active   Description of Group:   This group will allow patients to explore their thoughts and feelings about diagnoses they have received. Patients will be guided to explore their level of understanding and acceptance of these diagnoses. Facilitator will encourage patients to process their thoughts and feelings about the reactions of others to their diagnosis and will guide patients in identifying ways to discuss their diagnosis with significant others in their lives. This group will be process-oriented, with patients participating in exploration of their own experiences, giving and receiving support, and processing challenge from other group members.   Therapeutic Goals: 1. Patient will demonstrate understanding of diagnosis as evidenced by identifying two or more symptoms of the disorder 2. Patient will be able to express two feelings regarding the diagnosis 3. Patient will demonstrate their ability to communicate their needs through discussion and/or role play  Summary of Patient Progress: Actively and appropriately engaged in the group. Patient was able to provide support and validation to other group members.Patient practiced active listening when interacting with the facilitator and other group members Patient in still in the process of obtaining treatment goals. Patient admits to being paranoid all of her life and reports that she has a lack in trust in others. Patient will communicate their needs to others after discharge by "talking to my sister and other family members."       Therapeutic Modalities:   Cognitive Behavioral Therapy Brief Therapy Feelings Identification    Darin Engels, [place] 11/11/2017 1:49 PM

## 2017-11-11 NOTE — Progress Notes (Signed)
[facility] MD Progress Note  11/11/2017 12:18 PM Lauren [name]  MRN:  [id number] Subjective:  Pt is much less paranoid today. She is seen out of her room and interacting well with other patients. She is gaining better insight. She states taht she would like to restart her atenolol because she noticed her blood pressure was elevated. She was previously refusing all blood pressure medications. She is much less irritable today than on admission. She has bright affect and is joking and laughing appropriately with this provider. She states that she is aware that she has a 30 day eviction notice and admits she was behind on rent. She has an outpatient case manager through social services. She states that she was paranoid about her when they initial met but feels safer with her helping her find housing. Pt states that prior to admission she was on a 12 day fast because she thought someone was switching her medications and thought maybe her food was poisoned. She states, "I lived off crackers and water." She is happy that she is going to be moving to another apartment. She does not want a group home. She states that today is her sister birthday and has a big smile about this. Pt is much more organized in conversation today. She is fully oriented  Principal Problem: Schizophrenia ([place]) Diagnosis:   Patient Active Problem List   Diagnosis Date Noted  . Schizophrenia ([place]) [F20.9] 11/04/2017    Priority: High  . Acid reflux [K21.9] 06/29/2015  . Diabetes mellitus type 2, controlled, without complications ([place]) [W09.8] 06/29/2015  . Cardiac murmur [R01.1] 06/29/2015  . Arthritis of knee, degenerative [M17.10] 06/28/2015  . Insomnia w/ sleep apnea [G47.00, G47.30] 03/21/2015  . Anxiety disorder [F41.9] 03/21/2015  . Hypertension [I10] 03/21/2015  . HLD (hyperlipidemia) [E78.5] 03/21/2015  . Urge incontinence [N39.41] 03/21/2015   Total Time spent with patient: 20 minutes  Past Psychiatric History: See  H&P  Past Medical History:  Past Medical History:  Diagnosis Date  . Anxiety   . Apnea, sleep 02/02/2014  . Awareness of heartbeats 02/02/2014  . Breathlessness on exertion 02/02/2014  . Diabetes mellitus   . Encounter for pre-employment examination 06/29/2015  . Excessive sweating 07/05/2015  . Fibromyalgia   . GERD (gastroesophageal reflux disease)   . Gravida 1 10/26/2015   1.    . Heart murmur   . Herniated disc   . Hyperlipidemia   . Hypertension   . Itch of skin 10/26/2015  . Lack of bladder control   . Lung tumor   . Rheumatoid arthritis ([place])   . Schizoaffective disorder ([place])   . Screening for cervical cancer 07/29/2017  . Seizure [facility])    after brain surgery 2012. last seizure 2013!  [name] Sex counseling 10/26/2015  . Sleep apnea   . 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 Stiffness of both knees 06/22/2015    Past Surgical History:  Procedure Laterality Date  . BRAIN TUMOR EXCISION  2012   benign  . CARDIAC CATHETERIZATION  2008  . CESAREAN SECTION    . CYST REMOVAL HAND    . LUNG LOBECTOMY  1977   benign tumor  . 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 Heart disease Brother   . Depression Mother   .  Heart attack Mother   . Stroke Mother   . Alcohol abuse Father   . Stroke Father   . Diabetes Sister   . Diabetes Sister   . Stomach cancer Sister   . Kidney disease Sister   . COPD Brother   . Lung cancer Brother   . Diabetes Brother    Family Psychiatric  History: See H&P Social History:  Social History   Substance and Sexual Activity  Alcohol Use No  . Alcohol/week: 0.0 oz     Social History   Substance and Sexual Activity  Drug Use No    Social History   Socioeconomic History  . Marital status: Single    Spouse name:  None  . Number of children: None  . Years of education: None  . Highest education level: None  Social Needs  . Financial resource strain: Not hard at all  . Food insecurity - worry: Never true  . Food insecurity - inability: Never true  . Transportation needs - medical: No  . Transportation needs - non-medical: No  Occupational History  . None  Tobacco Use  . Smoking status: Never Smoker  . Smokeless tobacco: Never Used  Substance and Sexual Activity  . Alcohol use: No    Alcohol/week: 0.0 oz  . Drug use: No  . Sexual activity: No  Other Topics Concern  . None  Social History Narrative  . None   Additional Social History:                         Sleep: Good  Appetite:  Good  Current Medications: Current Facility-Administered Medications  Medication Dose Route Frequency Provider Last Rate Last Dose  . alum & mag hydroxide-simeth (MAALOX/MYLANTA) 200-200-20 MG/5ML suspension 30 mL  30 mL Oral Q4H PRN [name], [name], MD      . aspirin EC tablet 81 mg  81 mg Oral QAC breakfast [name], [name], MD   81 mg at 11/11/17 0843  . atenolol (TENORMIN) tablet 100 mg  100 mg Oral Daily Makoto Sellitto R, MD      . benztropine (COGENTIN) tablet 0.5 mg  0.5 mg Oral QHS [name], [name], MD   0.5 mg at 11/10/17 2148  . haloperidol (HALDOL) tablet 10 mg  10 mg Oral QHS Vaishnav Demartin, Tyson Babinski, MD   10 mg at 11/10/17 2148  . [START ON 12/02/2017] haloperidol decanoate (HALDOL DECANOATE) 100 MG/ML injection 50 mg  50 mg Intramuscular Once Brylee Berk, Tyson Babinski, MD      . hydrOXYzine (ATARAX/VISTARIL) tablet 25 mg  25 mg Oral TID PRN [name], [name], MD      . magnesium hydroxide (MILK OF MAGNESIA) suspension 30 mL  30 mL Oral Daily PRN [name], [name], MD      . metFORMIN (GLUCOPHAGE) tablet 500 mg  500 mg Oral BID WC [name], [name], MD   500 mg at 11/11/17 0843  . pantoprazole (PROTONIX) EC tablet 40 mg  40 mg Oral Daily [name], [name], MD   40 mg at 11/10/17 8250  . potassium chloride SA  (K-DUR,KLOR-CON) CR tablet 20 mEq  20 mEq Oral Once Harsimran Westman, Tyson Babinski, MD      . QUEtiapine (SEROQUEL) tablet 25 mg  25 mg Oral QHS PRN Eldoris Beiser R, MD      . rosuvastatin (CRESTOR) tablet 40 mg  40 mg Oral q1800 [name], [name], MD   40 mg at 11/10/17 1753  Lab Results: No results found for this or any previous visit (from the past 48 hour(s)).  Blood Alcohol level:  Lab Results  Component Value Date   ETH <10 11/03/2017   ETH <10 67/73/7366    Metabolic Disorder Labs: Lab Results  Component Value Date   HGBA1C 6.2 (H) 11/05/2017   MPG 131.24 11/05/2017   MPG 131.24 09/09/2017   No results found for: PROLACTIN Lab Results  Component Value Date   CHOL 217 (H) 11/05/2017   TRIG 102 11/05/2017   HDL 34 (L) 11/05/2017   CHOLHDL 6.4 11/05/2017   VLDL 20 11/05/2017   LDLCALC 163 (H) 11/05/2017   LDLCALC 153 (H) 09/09/2017    Physical Findings: AIMS:  , ,  ,  ,    CIWA:    COWS:     Musculoskeletal: Strength & Muscle Tone: within normal limits Gait & Station: normal Patient leans: N/A  Psychiatric Specialty Exam: Physical Exam  Nursing note and vitals reviewed.   Review of Systems  All other systems reviewed and are negative.   Blood pressure (!) 147/100, pulse 90, temperature 98.8 F (37.1 C), temperature source Oral, resp. rate 18, height 5' 1.81" (1.57 m), weight 102.5 kg (226 lb), SpO2 98 %.Body mass index is 41.59 kg/m.  General Appearance: Casual  Eye Contact:  Fair  Speech:  Clear and Coherent  Volume:  Normal  Mood:  Euthymic  Affect:  Appropriate  Thought Process:  Coherent and Goal Directed  Orientation:  Full (Time, Place, and Person)  Thought Content:  Logical  Suicidal Thoughts:  No  Homicidal Thoughts:  No  Memory:  Immediate;   Fair  Judgement:  Impaired  Insight:  Lacking  Psychomotor Activity:  Normal  Concentration:  Concentration: Fair  Recall:  [employer] of Knowledge:  Fair  Language:  Fair  Akathisia:  No      Assets:   Communication Skills Resilience Social Support  ADL's:  Intact  Cognition:  WNL  Sleep:  Number of Hours: 6.15     Treatment Plan Summary: 67 yo female admitted due to acute decompensation of psychosis. She is showing improvements daily. She is much more organized today and less paranoid. She is showing better insight into taking her medications. Haldol was increased yesterday with no side effects.   Plan:  Schizophrenia -Increased oral haldol to 10 mg qhs for paranoia last night. She refuses another Haldol Dec injection -She received Haldol Dec 50 mg on 2/26  HTN -Pt is wanting to restart Atenolol 100 mg daily today  DM -Pt now willing to take Metformin  History of meningioma -CT head done Possible recurrence of meningioma. No mass effect. Appears stable. Discussed case with on call neurosurgeon, Dr. Novella Olive. He took a look at the scan and feels there is possible recurrence and can be followed up with as an outpatient. She can be seen in the neurosurgery clinic at [facility]. Will have CSW make appointment prior to discharge  Labs: -Total cholesterol was 217 and hemoglobin A1c was 6.2 -EKG showed QTC of 442  Dispo -She will need psychotropic medication management follow-up appointment.Pt lives alone in house. She has supportive family and I spoke to her sister yesterday. She also has case manager helping her with new housing     Marylin Crosby, MD 11/11/2017, 12:18 PM

## 2017-11-11 NOTE — Plan of Care (Signed)
Patient is encouraged to participate more with peer activities and be more assertive with ADLs, patient isolate to her room more often than normal, encouraged to get out of room and socialize with peers, patient has tendencies to pick and choose what medication to take and reject water stating that is poisoned to make her sick. Patient denies SI/HI and contract for safety of self and others, no signs of AVH at this time, 15 minute safety rounds in progress Progressing Activity: Will identify at least one activity in which they can participate 11/11/2017 0141 - Progressing by Clemens Catholic, RN Coping: Ability to identify and develop effective coping behavior will improve 11/11/2017 0141 - Progressing by Clemens Catholic, RN Ability to interact with others will improve 11/11/2017 0141 - Progressing by Clemens Catholic, RN Participation in decision-making will improve 11/11/2017 0141 - Progressing by Clemens Catholic, RN Ability to use eye contact when communicating with others will improve 11/11/2017 0141 - Progressing by Clemens Catholic, RN Activity: Sleeping patterns will improve 11/11/2017 0141 - Progressing by Clemens Catholic, RN Pain Managment: General experience of comfort will improve 11/11/2017 0141 - Progressing by Clemens Catholic, RN

## 2017-11-11 NOTE — Plan of Care (Signed)
Patient has the ability to cope and has been participating in unit groups today without any issues thus far. Patient has participated in decision-making regarding her health care. Patient utilizes fair eye contact when communicating with this Probation officer. Patient has the ability to verbalize positive feelings stating that her goals for today is to "go to group and learn what I need to in order to get out of here". Patient reports that she slept "pretty well, but I woke up with a headache". Patient has no complaints about her overall comfort level. Patient is safe on the unit at this time.

## 2017-11-12 ENCOUNTER — Other Ambulatory Visit: Payer: Self-pay

## 2017-11-12 DIAGNOSIS — K219 Gastro-esophageal reflux disease without esophagitis: Secondary | ICD-10-CM

## 2017-11-12 DIAGNOSIS — E7800 Pure hypercholesterolemia, unspecified: Secondary | ICD-10-CM

## 2017-11-12 DIAGNOSIS — I10 Essential (primary) hypertension: Secondary | ICD-10-CM

## 2017-11-12 NOTE — Progress Notes (Signed)
[facility] MD Progress Note  11/12/2017 3:16 PM Lauren [name]  MRN:  [id number] Subjective:  Pt states she is doing well. Pt is still paranoid about the water being contaminated. However, she has been eating and drinking water adequately. Pt has much brighter affect. She is observed out in the milieu socializing and singing with peers. She does not appear manic. She is much less paranoid compared to admission. She states that she admits she felt that her water in her home was sewer water because it was brown when she flushed the toilet. She states that she plans to stick to drinking bottled water to be safe. She is able to have a very reasonable conversation today. She denies AH, VH. She states that she does not want to restart Lisinopril today and wants to see her PCP about this.  She asks if she will be getting prescriptions for haldol and cogentin so she can fill them.     Principal Problem: Schizophrenia ([place]) Diagnosis:   Patient Active Problem List   Diagnosis Date Noted  . Schizophrenia ([place]) [F20.9] 11/04/2017    Priority: High  . Acid reflux [K21.9] 06/29/2015  . Diabetes mellitus type 2, controlled, without complications ([place]) [S85.4] 06/29/2015  . Cardiac murmur [R01.1] 06/29/2015  . Arthritis of knee, degenerative [M17.10] 06/28/2015  . Insomnia w/ sleep apnea [G47.00, G47.30] 03/21/2015  . Anxiety disorder [F41.9] 03/21/2015  . Hypertension [I10] 03/21/2015  . HLD (hyperlipidemia) [E78.5] 03/21/2015  . Urge incontinence [N39.41] 03/21/2015   Total Time spent with patient: 25 minutes  Past Psychiatric History: See H&P  Past Medical History:  Past Medical History:  Diagnosis Date  . Anxiety   . Apnea, sleep 02/02/2014  . Awareness of heartbeats 02/02/2014  . Breathlessness on exertion 02/02/2014  . Diabetes mellitus   . Encounter for pre-employment examination 06/29/2015  . Excessive sweating 07/05/2015  . Fibromyalgia   . GERD (gastroesophageal reflux disease)   .  Gravida 1 10/26/2015   1.    . Heart murmur   . Herniated disc   . Hyperlipidemia   . Hypertension   . Itch of skin 10/26/2015  . Lack of bladder control   . Lung tumor   . Rheumatoid arthritis ([place])   . Schizoaffective disorder ([place])   . Screening for cervical cancer 07/29/2017  . Seizure [facility])    after brain surgery 2012. last seizure 2013!  [name] Sex counseling 10/26/2015  . Sleep apnea   . 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 Stiffness of both knees 06/22/2015    Past Surgical History:  Procedure Laterality Date  . BRAIN TUMOR EXCISION  2012   benign  . CARDIAC CATHETERIZATION  2008  . CESAREAN SECTION    . CYST REMOVAL HAND    . LUNG LOBECTOMY  1977   benign tumor  . 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 Heart disease Brother   . Depression Mother   . Heart attack Mother   . Stroke Mother   . Alcohol abuse Father   . Stroke Father   . Diabetes Sister   . Diabetes Sister   . Stomach cancer Sister   . Kidney disease Sister   . COPD Brother   . Lung cancer Brother   . Diabetes Brother  Family Psychiatric  History: See H&P Social History:  Social History   Substance and Sexual Activity  Alcohol Use No  . Alcohol/week: 0.0 oz     Social History   Substance and Sexual Activity  Drug Use No    Social History   Socioeconomic History  . Marital status: Single    Spouse name: None  . Number of children: None  . Years of education: None  . Highest education level: None  Social Needs  . Financial resource strain: Not hard at all  . Food insecurity - worry: Never true  . Food insecurity - inability: Never true  . Transportation needs - medical: No  . Transportation needs - non-medical: No  Occupational History  . None  Tobacco  Use  . Smoking status: Never Smoker  . Smokeless tobacco: Never Used  Substance and Sexual Activity  . Alcohol use: No    Alcohol/week: 0.0 oz  . Drug use: No  . Sexual activity: No  Other Topics Concern  . None  Social History Narrative  . None   Additional Social History:                         Sleep: Good  Appetite:  Good  Current Medications: Current Facility-Administered Medications  Medication Dose Route Frequency Provider Last Rate Last Dose  . alum & mag hydroxide-simeth (MAALOX/MYLANTA) 200-200-20 MG/5ML suspension 30 mL  30 mL Oral Q4H PRN [name], [name], MD      . aspirin EC tablet 81 mg  81 mg Oral QAC breakfast [name], [name], MD   81 mg at 11/12/17 0824  . atenolol (TENORMIN) tablet 100 mg  100 mg Oral Daily Donald Memoli, Tyson Babinski, MD   100 mg at 11/12/17 0824  . benztropine (COGENTIN) tablet 0.5 mg  0.5 mg Oral QHS [name], [name], MD   0.5 mg at 11/11/17 2143  . haloperidol (HALDOL) tablet 10 mg  10 mg Oral QHS Vian Fluegel, Tyson Babinski, MD   10 mg at 11/11/17 2142  . [START ON 12/02/2017] haloperidol decanoate (HALDOL DECANOATE) 100 MG/ML injection 50 mg  50 mg Intramuscular Once Nickalos Petersen, Tyson Babinski, MD      . hydrOXYzine (ATARAX/VISTARIL) tablet 25 mg  25 mg Oral TID PRN [name], [name], MD      . magnesium hydroxide (MILK OF MAGNESIA) suspension 30 mL  30 mL Oral Daily PRN [name], [name], MD      . metFORMIN (GLUCOPHAGE) tablet 500 mg  500 mg Oral BID WC [name], [name], MD   500 mg at 11/12/17 0824  . pantoprazole (PROTONIX) EC tablet 40 mg  40 mg Oral Daily [name], [name], MD   40 mg at 11/10/17 0272  . potassium chloride SA (K-DUR,KLOR-CON) CR tablet 20 mEq  20 mEq Oral Once Dejia Ebron, Tyson Babinski, MD      . QUEtiapine (SEROQUEL) tablet 25 mg  25 mg Oral QHS PRN Concettina Leth, Tyson Babinski, MD      . rosuvastatin (CRESTOR) tablet 40 mg  40 mg Oral q1800 [name], [name], MD   40 mg at 11/10/17 1753    Lab Results: No results found for this or any previous visit (from the past 48  hour(s)).  Blood Alcohol level:  Lab Results  Component Value Date   [facility] <10 11/03/2017   ETH <10 53/66/4403    Metabolic Disorder Labs: Lab Results  Component Value Date   HGBA1C 6.2 (H) 11/05/2017  MPG 131.24 11/05/2017   MPG 131.24 09/09/2017   No results found for: PROLACTIN Lab Results  Component Value Date   CHOL 217 (H) 11/05/2017   TRIG 102 11/05/2017   HDL 34 (L) 11/05/2017   CHOLHDL 6.4 11/05/2017   VLDL 20 11/05/2017   LDLCALC 163 (H) 11/05/2017   LDLCALC 153 (H) 09/09/2017    Physical Findings: AIMS:  , ,  ,  ,    CIWA:    COWS:     Musculoskeletal: Strength & Muscle Tone: within normal limits Gait & Station: normal Patient leans: N/A  Psychiatric Specialty Exam: Physical Exam  ROS  Blood pressure (!) 143/80, pulse 96, temperature 97.7 F (36.5 C), temperature source Oral, resp. rate 20, height 5' 1.81" (1.57 m), weight 102.5 kg (226 lb), SpO2 98 %.Body mass index is 41.59 kg/m.  General Appearance: Casual  Eye Contact:  Good  Speech:  Clear and Coherent  Volume:  Normal  Mood:  Euthymic  Affect:  Appropriate  Thought Process:  Coherent and Goal Directed  Orientation:  Full (Time, Place, and Person)  Thought Content:  Logical  Suicidal Thoughts:  No  Homicidal Thoughts:  No  Memory:  Immediate;   Fair  Judgement:  Intact  Insight:  improved  Psychomotor Activity:  Normal  Concentration:  Concentration: Fair  Recall:  [employer] of Knowledge:  Fair  Language:  Fair  Akathisia:  No      Assets:  Resilience  ADL's:  Intact  Cognition:  WNL  Sleep:  Number of Hours: 7     Treatment Plan Summary: 67 yo female admitted due to psychosis in the context of medication noncompliance. She is showing significantly improvements. Still some paranoia related to water but overall improving. Insight is improving each day.  Plan:  Schizophrenia -Continue Haldol 10 mg qhs She refuses another Haldol Dec injection -She received Haldol Dec 50 mg on  2/26  HTN -Continue Atenolol 100 mg daily.  -She does not want to start Lisinopril today  DM -Ptnow willing to take Metformin  History of meningioma -CT head done Possible recurrence of meningioma. No mass effect. Appears stable. Discussed case with on call neurosurgeon, Dr. Novella Olive. He took a look at the scan and feels there is possible recurrence and can be followed up with as an outpatient. She can be seen in the neurosurgery clinic at [facility]. Will have CSW make appointment prior to discharge  Labs: -Total cholesterol was 217 and hemoglobin A1c was 6.2 -EKG showed QTC of 442  Dispo -She will need psychotropic medication management follow-up appointment-likely with [facility] due to insurancePt lives alone in house. She has supportive familyand I spoke to her sister today to provide update. She also has case manager helping her with new housing  Greater than 50% of face to face time with patient was spent on counseling and coordination of care. We discussed medications, follow up with mental health and neurosurgery. Spoke with sister, [name] and discussed discharge planning, follow up at [place] and medications. Her sister will pick her up when ready     Marylin Crosby, MD 11/12/2017, 3:16 PM

## 2017-11-12 NOTE — Plan of Care (Signed)
  Patient attending unit programing . Continue to work on Radiographer, therapeutic . Interacting  with peers on th unit .  Appropriate  decision making skills noted . Patient able to list  positive  attributes for herself .   Voice no concerns around  sleep.  Progressing Activity: Will identify at least one activity in which they can participate 11/12/2017 1546 - Progressing by Leodis Liverpool, RN Coping: Ability to identify and develop effective coping behavior will improve 11/12/2017 1546 - Progressing by Leodis Liverpool, RN Ability to interact with others will improve 11/12/2017 1546 - Progressing by Leodis Liverpool, RN Participation in decision-making will improve 11/12/2017 1546 - Progressing by Leodis Liverpool, RN Ability to use eye contact when communicating with others will improve 11/12/2017 1546 - Progressing by Leodis Liverpool, RN Self-Concept: Ability to verbalize positive feelings about self will improve 11/12/2017 1546 - Progressing by Leodis Liverpool, RN Activity: Sleeping patterns will improve 11/12/2017 1546 - Progressing by Leodis Liverpool, RN Pain Managment: General experience of comfort will improve 11/12/2017 1546 - Progressing by Leodis Liverpool, RN

## 2017-11-12 NOTE — Plan of Care (Signed)
Patient is showing some improvement in getting better and taking her medicines, but feels critical about  drinking water .states that is the source of her illness because it contains poison, patient is encouraged to participate more with peer activities and communicate more of her feelings, patient denies any suicidal thoughts and contract for safety of self and others, sleep is adequate, no distress noted. Progressing Activity: Will identify at least one activity in which they can participate 11/12/2017 0125 - Progressing by Clemens Catholic, RN Coping: Ability to identify and develop effective coping behavior will improve 11/12/2017 0125 - Progressing by Clemens Catholic, RN Ability to interact with others will improve 11/12/2017 0125 - Progressing by Clemens Catholic, RN Participation in decision-making will improve 11/12/2017 0125 - Progressing by Clemens Catholic, RN Ability to use eye contact when communicating with others will improve 11/12/2017 0125 - Progressing by Clemens Catholic, RN Self-Concept: Ability to verbalize positive feelings about self will improve 11/12/2017 0125 - Progressing by Clemens Catholic, RN Activity: Sleeping patterns will improve 11/12/2017 0125 - Progressing by Clemens Catholic, RN Pain Managment: General experience of comfort will improve 11/12/2017 0125 - Progressing by Clemens Catholic, RN
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
## 2017-11-12 NOTE — Progress Notes (Signed)
D: Patient stated slept good last night .Stated appetite is good and energy level  Is normal. Stated concentration is good . Stated on Depression scale ,2 hopeless 2  and anxiety 2 .( low 0-10 high) Denies suicidal  homicidal ideations  .  No auditory hallucinations  No pain concerns . Appropriate ADL'S. Interacting with peers and staff. Patient attending unit programing . Continue to work on Radiographer, therapeutic . Interacting  with peers on th unit .  Appropriate  decision making skills noted . Patient able to list  positive  attributes for herself .   Voice no concerns around  sleep. Goal to go to group therapy   A: Encourage patient participation with unit programming . Instruction  Given on  Medication , verbalize understanding.  R: Voice no other concerns. Staff continue to monitor

## 2017-11-13 MED ORDER — IBUPROFEN 200 MG PO TABS
400.0000 mg | ORAL_TABLET | ORAL | Status: DC | PRN
  Administered 2017-11-14 – 2017-11-17 (×3): 400 mg via ORAL
  Filled 2017-11-13 (×3): qty 2

## 2017-11-13 NOTE — [facility] Group Notes (Signed)
  11/13/2017 9am  Type of Therapy and Topic: Group Therapy: Goals Group: SMART Goals   Participation Level: Active  Description of Group:    The purpose of a daily goals group is to assist and guide patients in setting recovery/wellness-related goals. The objective is to set goals as they relate to the crisis in which they were admitted. Patients will be using SMART goal modalities to set measurable goals. Characteristics of realistic goals will be discussed and patients will be assisted in setting and processing how one will reach their goal. Facilitator will also assist patients in applying interventions and coping skills learned in psycho-education groups to the SMART goal and process how one will achieve defined goal.   Therapeutic Goals:   -Patients will develop and document one goal related to or their crisis in which brought them into treatment.  -Patients will be guided by LCSW using SMART goal setting modality in how to set a measurable, attainable, realistic and time sensitive goal.  -Patients will process barriers in reaching goal.  -Patients will process interventions in how to overcome and successful in reaching goal.   Patient's Goal: "To talk to my social worker about securing an apartment in the next few days."  Therapeutic Modalities:  [place] goals setting  Darin Engels, [place] 11/13/2017 9:43 AM

## 2017-11-13 NOTE — Progress Notes (Signed)
D: Patient stated slept good last night .Stated appetite is good and energy level  Is normal. Stated concentration is good . Stated on Depression scale 2 , hopeless 2 and anxiety 2 .( low 0-10 high) Denies suicidal  homicidal ideations  .  No auditory hallucinations  No pain concerns . Appropriate ADL'S. Interacting with peers and staff. Patient can identify  ways of coping . Interacting with peers on the unit . Improved  on decision making   Patient able to hold  conversation and maintained  eye contact . Attending unit programing  able to verbalize feeling   A: Encourage patient participation with unit programming . Instruction  Given on  Medication , verbalize understanding.  R: Voice no other concerns. Staff continue to monitor

## 2017-11-13 NOTE — Plan of Care (Signed)
Patient is safe in her room, encouraged to get out of room to socialize more with peers, patient states that she is feeling cold if she gets out of her room, no further complains, denies any SI/HI and no signs of AVH. Progressing Activity: Will identify at least one activity in which they can participate 11/13/2017 1945 - Progressing by Clemens Catholic, RN Coping: Ability to identify and develop effective coping behavior will improve 11/13/2017 1945 - Progressing by Clemens Catholic, RN Ability to interact with others will improve 11/13/2017 1945 - Progressing by Clemens Catholic, RN Participation in decision-making will improve 11/13/2017 1945 - Progressing by Clemens Catholic, RN Ability to use eye contact when communicating with others will improve 11/13/2017 1945 - Progressing by Clemens Catholic, RN Self-Concept: Ability to verbalize positive feelings about self will improve 11/13/2017 1945 - Progressing by Clemens Catholic, RN Activity: Sleeping patterns will improve 11/13/2017 1945 - Progressing by Clemens Catholic, RN Pain Managment: General experience of comfort will improve 11/13/2017 1945 - Progressing by Clemens Catholic, RN

## 2017-11-13 NOTE — Progress Notes (Signed)
[facility] MD Progress Note  11/13/2017 2:04 PM Lauren [name]  MRN:  [id number] Subjective:  Pt doing better today. She states that she has a lot of pain in her knee joint from where she had surgery int he past. She has much brighter affect and smiling. She has been out of her room and around peers. She is much less paranoid. She states that she has a case worker, Otila Kluver who will be helping her with a new place to live after she moves out. She remembers that her sister was worried about her and thought she was paranoid and states, "I didn't want to let the ambulance in because I don't let people in my house at night. She thought I was paranoid." She also states taht she thought her water was sewer water but questions this that it may have not been real. She has much better insight. She also plans to get a new phone and service. Her sister was concerned because she turned her phone off. Pt states that when she gets out, she is getting a new one so she can listen to her gospel music. She feels her medications are very helpful and states that she can think very clearly and is happy about that. She states that she wants to back sure she gets a prescription for Haldol before she goes home. She allowed me to exam her today (which she previous did not allow due to paranoia) for any muscle stiffness or cogwheeling which none was noted on BUE or BLE. Denies any issues other than knee pain. Denies chest pain or stiffness. Denies SI, HI. We discussed that she has neurosurgery appointment in April and will follow up with [place].   Principal Problem: Schizophrenia ([place]) Diagnosis:   Patient Active Problem List   Diagnosis Date Noted  . Schizophrenia ([place]) [F20.9] 11/04/2017    Priority: High  . Acid reflux [K21.9] 06/29/2015  . Diabetes mellitus type 2, controlled, without complications ([place]) [F02.6] 06/29/2015  . Cardiac murmur [R01.1] 06/29/2015  . Arthritis of knee, degenerative [M17.10] 06/28/2015  . Insomnia  w/ sleep apnea [G47.00, G47.30] 03/21/2015  . Anxiety disorder [F41.9] 03/21/2015  . Hypertension [I10] 03/21/2015  . HLD (hyperlipidemia) [E78.5] 03/21/2015  . Urge incontinence [N39.41] 03/21/2015   Total Time spent with patient: 20 minutes  Past Psychiatric History: See H&P  Past Medical History:  Past Medical History:  Diagnosis Date  . Anxiety   . Apnea, sleep 02/02/2014  . Awareness of heartbeats 02/02/2014  . Breathlessness on exertion 02/02/2014  . Diabetes mellitus   . Encounter for pre-employment examination 06/29/2015  . Excessive sweating 07/05/2015  . Fibromyalgia   . GERD (gastroesophageal reflux disease)   . Gravida 1 10/26/2015   1.    . Heart murmur   . Herniated disc   . Hyperlipidemia   . Hypertension   . Itch of skin 10/26/2015  . Lack of bladder control   . Lung tumor   . Rheumatoid arthritis ([place])   . Schizoaffective disorder ([place])   . Screening for cervical cancer 07/29/2017  . Seizure [facility])    after brain surgery 2012. last seizure 2013!  [name] Sex counseling 10/26/2015  . Sleep apnea   . 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 Stiffness of both knees 06/22/2015    Past Surgical History:  Procedure Laterality Date  . BRAIN TUMOR EXCISION  2012   benign  . CARDIAC CATHETERIZATION  2008  . CESAREAN SECTION    .  CYST REMOVAL HAND    . LUNG LOBECTOMY  1977   benign tumor  . 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 Heart disease Brother   . Depression Mother   . Heart attack Mother   . Stroke Mother   . Alcohol abuse Father   . Stroke Father   . Diabetes Sister   . Diabetes Sister   . Stomach cancer Sister   . Kidney disease Sister   . COPD Brother   . Lung cancer Brother   . Diabetes Brother    Family Psychiatric   History: See H&P Social History:  Social History   Substance and Sexual Activity  Alcohol Use No  . Alcohol/week: 0.0 oz     Social History   Substance and Sexual Activity  Drug Use No    Social History   Socioeconomic History  . Marital status: Single    Spouse name: None  . Number of children: None  . Years of education: None  . Highest education level: None  Social Needs  . Financial resource strain: Not hard at all  . Food insecurity - worry: Never true  . Food insecurity - inability: Never true  . Transportation needs - medical: No  . Transportation needs - non-medical: No  Occupational History  . None  Tobacco Use  . Smoking status: Never Smoker  . Smokeless tobacco: Never Used  Substance and Sexual Activity  . Alcohol use: No    Alcohol/week: 0.0 oz  . Drug use: No  . Sexual activity: No  Other Topics Concern  . None  Social History Narrative  . None   Additional Social History:                         Sleep: Good  Appetite:  Good  Current Medications: Current Facility-Administered Medications  Medication Dose Route Frequency Provider Last Rate Last Dose  . alum & mag hydroxide-simeth (MAALOX/MYLANTA) 200-200-20 MG/5ML suspension 30 mL  30 mL Oral Q4H PRN [name], [name], MD      . aspirin EC tablet 81 mg  81 mg Oral QAC breakfast [name], [name], MD   81 mg at 11/13/17 0902  . atenolol (TENORMIN) tablet 100 mg  100 mg Oral Daily Diago Haik, Tyson Babinski, MD   100 mg at 11/13/17 0902  . benztropine (COGENTIN) tablet 0.5 mg  0.5 mg Oral QHS [name], [name], MD   0.5 mg at 11/12/17 0005  . haloperidol (HALDOL) tablet 10 mg  10 mg Oral QHS Yarianna Varble, Tyson Babinski, MD   10 mg at 11/12/17 2112  . [START ON 12/02/2017] haloperidol decanoate (HALDOL DECANOATE) 100 MG/ML injection 50 mg  50 mg Intramuscular Once Zeinab Rodwell, Tyson Babinski, MD      . hydrOXYzine (ATARAX/VISTARIL) tablet 25 mg  25 mg Oral TID PRN [name], [name], MD      . ibuprofen (ADVIL,MOTRIN) tablet 400 mg  400  mg Oral Q4H PRN Kenyanna Grzesiak R, MD      . magnesium hydroxide (MILK OF MAGNESIA) suspension 30 mL  30 mL Oral Daily PRN [name], [name], MD      . metFORMIN (GLUCOPHAGE) tablet 500 mg  500 mg Oral BID WC [name], [name], MD   500 mg at  11/13/17 0902  . pantoprazole (PROTONIX) EC tablet 40 mg  40 mg Oral Daily [name], [name], MD   40 mg at 11/13/17 0901  . potassium chloride SA (K-DUR,KLOR-CON) CR tablet 20 mEq  20 mEq Oral Once Johnryan Sao, Tyson Babinski, MD      . QUEtiapine (SEROQUEL) tablet 25 mg  25 mg Oral QHS PRN Jashad Depaula, Tyson Babinski, MD      . rosuvastatin (CRESTOR) tablet 40 mg  40 mg Oral q1800 [name], [name], MD   40 mg at 11/12/17 1729    Lab Results: No results found for this or any previous visit (from the past 76 hour(s)).  Blood Alcohol level:  Lab Results  Component Value Date   ETH <10 11/03/2017   ETH <10 07/37/1062    Metabolic Disorder Labs: Lab Results  Component Value Date   HGBA1C 6.2 (H) 11/05/2017   MPG 131.24 11/05/2017   MPG 131.24 09/09/2017   No results found for: PROLACTIN Lab Results  Component Value Date   CHOL 217 (H) 11/05/2017   TRIG 102 11/05/2017   HDL 34 (L) 11/05/2017   CHOLHDL 6.4 11/05/2017   VLDL 20 11/05/2017   LDLCALC 163 (H) 11/05/2017   LDLCALC 153 (H) 09/09/2017    Physical Findings: AIMS:  , ,  ,  ,    CIWA:    COWS:     Musculoskeletal: Strength & Muscle Tone: within normal limits Gait & Station: normal Patient leans: N/A  Psychiatric Specialty Exam: Physical Exam  Nursing note and vitals reviewed.   Review of Systems  All other systems reviewed and are negative.   Blood pressure 135/65, pulse 68, temperature 98.2 F (36.8 C), temperature source Oral, resp. rate 18, height 5' 1.81" (1.57 m), weight 102.5 kg (226 lb), SpO2 96 %.Body mass index is 41.59 kg/m.  General Appearance: Casual  Eye Contact:  Good  Speech:  Clear and Coherent  Volume:  Normal  Mood:  Euthymic  Affect:  Appropriate  Thought Process:  Coherent and  Goal Directed  Orientation:  Full (Time, Place, and Person)  Thought Content:  Logical  Suicidal Thoughts:  No  Homicidal Thoughts:  No  Memory:  Immediate;   Fair  Judgement:  Fair  Insight:  Fair improving  Psychomotor Activity:  Normal  Concentration:  Concentration: Fair  Recall:  [employer] of Knowledge:  Fair  Language:  Fair  Akathisia:  No      Assets:  Resilience  ADL's:  Intact  Cognition:  WNL  Sleep:  Number of Hours: 6.45     Treatment Plan Summary: 67 yo female admitted due to decompensation in setting of medication noncompliance. Symptoms are improving significantly, Paranoia is much improved, affect is brighter, she is no longer isolating. She has much better insight into taking medications.   Plan:  Schizophrenia -Continue Haldol 10 mg qhsShe refuses another Haldol Dec injection while in hospital -She received Haldol Dec 50 mg on 2/26  HTN -Continue Atenolol 100 mg daily.  -BP good today  DM -Metformin  History of meningioma -CT head done Possible recurrence of meningioma. No mass effect. Appears stable. Discussed case with on call neurosurgeon, Dr. Novella Olive. He took a look at the scan and feels there is possible recurrence and can be followed up with as an outpatient. She can be seen in the neurosurgery clinic at [facility]. She has follow up appointment 4/9  Labs: -Total cholesterol was 217 and hemoglobin A1c was 6.2 -EKG showed QTC  of [place] -She will need psychotropic medication management follow-up appointment-likely with [facility] due to insurancePt lives alone in house. She has supportive familyand I spoke to her sisteryesterday to provide update. She also has case manager helping her with new housing    Marylin Crosby, MD 11/13/2017, 2:04 PM
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
## 2017-11-13 NOTE — Plan of Care (Signed)
  Patient can identify  ways of coping . Interacting with peers on the unit . Improved  on decision making   Patient able to hold  conversation and maintained  eye contact . Attending unit programing  able to verbalize feeling   Progressing Activity: Will identify at least one activity in which they can participate 11/13/2017 1753 - Progressing by Leodis Liverpool, RN Coping: Ability to identify and develop effective coping behavior will improve 11/13/2017 1753 - Progressing by Leodis Liverpool, RN Ability to interact with others will improve 11/13/2017 1753 - Progressing by Leodis Liverpool, RN Participation in decision-making will improve 11/13/2017 1753 - Progressing by Leodis Liverpool, RN Ability to use eye contact when communicating with others will improve 11/13/2017 1753 - Progressing by Leodis Liverpool, RN Self-Concept: Ability to verbalize positive feelings about self will improve 11/13/2017 1753 - Progressing by Leodis Liverpool, RN Activity: Sleeping patterns will improve 11/13/2017 1753 - Progressing by Leodis Liverpool, RN Pain Managment: General experience of comfort will improve 11/13/2017 1753 - Progressing by Leodis Liverpool, RN

## 2017-11-14 ENCOUNTER — Other Ambulatory Visit: Payer: Self-pay

## 2017-11-14 DIAGNOSIS — K219 Gastro-esophageal reflux disease without esophagitis: Secondary | ICD-10-CM

## 2017-11-14 DIAGNOSIS — E7800 Pure hypercholesterolemia, unspecified: Secondary | ICD-10-CM

## 2017-11-14 DIAGNOSIS — I10 Essential (primary) hypertension: Secondary | ICD-10-CM

## 2017-11-14 NOTE — [facility] Group Notes (Signed)
[place] Group Notes:  (Nursing/MHT/Case Management/Adjunct)  Date:  11/14/2017  Time:  12:36 AM  Type of Therapy:  Group Therapy  Participation Level:  Active  Participation Quality:  Appropriate  Affect:  Appropriate  Cognitive:  Alert  Insight:  Good  Engagement in Group:  Engaged  Modes of Intervention:  Support  Summary of Progress/Problems:  Lauren [name] 11/14/2017, 12:36 AM

## 2017-11-14 NOTE — Plan of Care (Signed)
Patient is progressing with a brighter affect.Appropriate with staff & peers.No issues verbalized to take medications.Appetite and energy level good.Support and encouragement given.
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
## 2017-11-14 NOTE — Tx Team (Signed)
Interdisciplinary Treatment and Diagnostic Plan Update  11/14/2017 Time of Session: 0830 Lauren [name] MRN: [id number]  Principal Diagnosis: Schizophrenia [facility])  Secondary Diagnoses: Principal Problem:   Schizophrenia ([place])   Current Medications:  Current Facility-Administered Medications  Medication Dose Route Frequency Provider Last Rate Last Dose  . alum & mag hydroxide-simeth (MAALOX/MYLANTA) 200-200-20 MG/5ML suspension 30 mL  30 mL Oral Q4H PRN [name], [name], MD      . aspirin EC tablet 81 mg  81 mg Oral QAC breakfast [name], [name], MD   81 mg at 11/14/17 2952  . atenolol (TENORMIN) tablet 100 mg  100 mg Oral Daily McNew, Tyson Babinski, MD   100 mg at 11/14/17 8413  . benztropine (COGENTIN) tablet 0.5 mg  0.5 mg Oral QHS [name], [name], MD   0.5 mg at 11/13/17 2054  . haloperidol (HALDOL) tablet 10 mg  10 mg Oral QHS McNew, Tyson Babinski, MD   10 mg at 11/13/17 2054  . [START ON 12/02/2017] haloperidol decanoate (HALDOL DECANOATE) 100 MG/ML injection 50 mg  50 mg Intramuscular Once McNew, Holly R, MD      . hydrOXYzine (ATARAX/VISTARIL) tablet 25 mg  25 mg Oral TID PRN [name], [name], MD      . ibuprofen (ADVIL,MOTRIN) tablet 400 mg  400 mg Oral Q4H PRN Marylin Crosby, MD   400 mg at 11/14/17 0824  . magnesium hydroxide (MILK OF MAGNESIA) suspension 30 mL  30 mL Oral Daily PRN [name], [name], MD      . metFORMIN (GLUCOPHAGE) tablet 500 mg  500 mg Oral BID WC [name], [name], MD   500 mg at 11/14/17 2440  . pantoprazole (PROTONIX) EC tablet 40 mg  40 mg Oral Daily [name], [name], MD   40 mg at 11/14/17 1027  . potassium chloride SA (K-DUR,KLOR-CON) CR tablet 20 mEq  20 mEq Oral Once McNew, Tyson Babinski, MD      . QUEtiapine (SEROQUEL) tablet 25 mg  25 mg Oral QHS PRN McNew, Tyson Babinski, MD      . rosuvastatin (CRESTOR) tablet 40 mg  40 mg Oral q1800 [name], [name], MD   40 mg at 11/13/17 1654   PTA Medications: Medications Prior to Admission  Medication Sig Dispense Refill Last Dose   . aspirin EC 81 MG tablet Take 81 mg by mouth daily before breakfast.    unknown  . atenolol (TENORMIN) 100 MG tablet TAKE 1 TABLET BY MOUTH  DAILY 90 tablet 0 unknown  . benztropine (COGENTIN) 0.5 MG tablet Take 1 tablet (0.5 mg total) by mouth at bedtime. 90 tablet 1 unknown  . Bioflavonoid Products (VITAMIN C) CHEW Chew 1 tablet by mouth daily as needed (for immune system support).   unknown  . Capsaicin (ZOSTRIX EX) Apply 1 application topically 4 (four) times daily as needed (for knee pain.).   unknown  . haloperidol (HALDOL) 10 MG tablet Take 1 tablet (10 mg total) by mouth at bedtime. 90 tablet 1 unknown  . ibuprofen (ADVIL,MOTRIN) 200 MG tablet Take 600 mg by mouth every 8 (eight) hours as needed (for knee pain.).   unknown  . lisinopril-hydrochlorothiazide (PRINZIDE,ZESTORETIC) 20-25 MG tablet TAKE 1 TABLET BY MOUTH  DAILY 90 tablet 0 unknown  . Lotion Base LOTN Apply 1 application topically 4 (four) times daily as needed (for knee pain.). Two [employer] Go[employer] Essential Lotion (Aloe Vera, Almond Oil, and 6 natural anti-inflammatory essential oils; [place], [place], [place], [place], Peppermint and  Fidela [place])   unknown  . metFORMIN (GLUCOPHAGE) 500 MG tablet TAKE 1 TABLET BY MOUTH TWICE DAILY WITH A MEAL 60 tablet 0 unknown  . omeprazole (PRILOSEC) 20 MG capsule TAKE 1 CAPSULE BY MOUTH  DAILY 90 capsule 0 unknown  . QUEtiapine (SEROQUEL) 25 MG tablet Take 1 tablet (25 mg total) by mouth at bedtime as needed. 90 tablet 1 unknown  . rosuvastatin (CRESTOR) 40 MG tablet TAKE 1 TABLET BY MOUTH  DAILY 90 tablet 0 unknown    Patient Stressors: Financial difficulties Health problems Medication change or noncompliance  Patient Strengths: Ability for insight Motivation for treatment/growth Supportive family/friends  Treatment Modalities: Medication Management, Group therapy, Case management,  1 to 1 session with clinician, Psychoeducation, Recreational therapy.   Physician  Treatment Plan for Primary Diagnosis: Schizophrenia ([place]) Long Term Goal(s): Improvement in symptoms so as ready for discharge   Short Term Goals: Compliance with prescribed medications will improve  Medication Management: Evaluate patient's response, side effects, and tolerance of medication regimen.  Therapeutic Interventions: 1 to 1 sessions, Unit Group sessions and Medication administration.  Evaluation of Outcomes: Progressing  Physician Treatment Plan for Secondary Diagnosis: Principal Problem:   Schizophrenia ([place])  Long Term Goal(s): Improvement in symptoms so as ready for discharge   Short Term Goals: Compliance with prescribed medications will improve     Medication Management: Evaluate patient's response, side effects, and tolerance of medication regimen.  Therapeutic Interventions: 1 to 1 sessions, Unit Group sessions and Medication administration.  Evaluation of Outcomes: Progressing   RN Treatment Plan for Primary Diagnosis: Schizophrenia ([place]) Long Term Goal(s): Knowledge of disease and therapeutic regimen to maintain health will improve  Short Term Goals: Ability to verbalize frustration and anger appropriately will improve, Ability to demonstrate self-control, Ability to identify and develop effective coping behaviors will improve and Compliance with prescribed medications will improve  Medication Management: RN will administer medications as ordered by provider, will assess and evaluate patient's response and provide education to patient for prescribed medication. RN will report any adverse and/or side effects to prescribing provider.  Therapeutic Interventions: 1 on 1 counseling sessions, Psychoeducation, Medication administration, Evaluate responses to treatment, Monitor vital signs and CBGs as ordered, Perform/monitor CIWA, COWS, AIMS and Fall Risk screenings as ordered, Perform wound care treatments as ordered.  Evaluation of Outcomes: Progressing   LCSW  Treatment Plan for Primary Diagnosis: Schizophrenia ([place]) Long Term Goal(s): Safe transition to appropriate next level of care at discharge, Engage patient in therapeutic group addressing interpersonal concerns.  Short Term Goals: Engage patient in aftercare planning with referrals and resources, Increase social support, Increase emotional regulation, Facilitate acceptance of mental health diagnosis and concerns, Identify triggers associated with mental health/substance abuse issues and Increase skills for wellness and recovery  Therapeutic Interventions: Assess for all discharge needs, 1 to 1 time with Social worker, Explore available resources and support systems, Assess for adequacy in community support network, Educate family and significant other(s) on suicide prevention, Complete Psychosocial Assessment, Interpersonal group therapy.  Evaluation of Outcomes: Progressing   Progress in Treatment: Attending groups: Yes. Participating in groups: Yes. Taking medication as prescribed: Yes. Toleration medication: Yes. Family/Significant other contact made: yes, Marcial Pacas, Patients sister, [phone] Patient understands diagnosis: No. and As evidenced by:  Confusion as to why she has been admitted Discussing patient identified problems/goals with staff: Yes. Medical problems stabilized or resolved: Yes. Denies suicidal/homicidal ideation: Yes. Issues/concerns per patient self-inventory: No. Other:  New problem(s) identified: No, Describe:  None  New  Short Term/Long Term Goal(s): "To get better."  Discharge Plan or Barriers: At discharge, patient will return back to apartment and follow up with her providers in outpatient treatment.   Reason for Continuation of Hospitalization: Delusions  Medication stabilization  Estimated Length of Stay: 3-5 days Attendees: Patient:  11/14/2017 9:19 AM  Physician: Dr. Wonda Olds, MD 11/14/2017 9:19 AM  Nursing: Roetta Sessions, RN 11/14/2017 9:19 AM  RN  Care Manager: 11/14/2017 9:19 AM  Social Worker: Alden Hipp, LCSW 11/14/2017 9:19 AM  Recreational Therapist:  11/14/2017 9:19 AM  Other: 11/14/2017 9:19 AM  Other:  11/14/2017 9:19 AM  Other: 11/14/2017 9:19 AM    Scribe for Treatment Team: Alden Hipp, LCSW 11/14/2017 9:19 AM

## 2017-11-14 NOTE — Telephone Encounter (Signed)
tried to call but no voicemail setup

## 2017-11-14 NOTE — Progress Notes (Signed)
[facility] MD Progress Note  11/14/2017 10:40 AM Lauren [name]  MRN:  [id number] Subjective:  Pt states taht she is doing well. She states taht she still feels slightly paranoid because "i worry about what I put in my body." She states that she had some diarrhea a few days since being in the hospital. However, she is not perseverative on this and also has better insight into the paranoia. She was given the number to her case worker to touch base about new hosing when she is evicted in 30 days. She plans to get a new cell phone and turn her service on. She has some knee pain from a previous surgery but overall feeling good. She has brighter affect. She is attending group and interacting well with peers. She is looking forward to discharging soon. Denies AH, SI, HI.   Principal Problem: Schizophrenia ([place]) Diagnosis:   Patient Active Problem List   Diagnosis Date Noted  . Schizophrenia ([place]) [F20.9] 11/04/2017    Priority: High  . Acid reflux [K21.9] 06/29/2015  . Diabetes mellitus type 2, controlled, without complications ([place]) [E52.7] 06/29/2015  . Cardiac murmur [R01.1] 06/29/2015  . Arthritis of knee, degenerative [M17.10] 06/28/2015  . Insomnia w/ sleep apnea [G47.00, G47.30] 03/21/2015  . Anxiety disorder [F41.9] 03/21/2015  . Hypertension [I10] 03/21/2015  . HLD (hyperlipidemia) [E78.5] 03/21/2015  . Urge incontinence [N39.41] 03/21/2015   Total Time spent with patient: 20 minutes  Past Psychiatric History: See H&P  Past Medical History:  Past Medical History:  Diagnosis Date  . Anxiety   . Apnea, sleep 02/02/2014  . Awareness of heartbeats 02/02/2014  . Breathlessness on exertion 02/02/2014  . Diabetes mellitus   . Encounter for pre-employment examination 06/29/2015  . Excessive sweating 07/05/2015  . Fibromyalgia   . GERD (gastroesophageal reflux disease)   . Gravida 1 10/26/2015   1.    . Heart murmur   . Herniated disc   . Hyperlipidemia   . Hypertension   . Itch of  skin 10/26/2015  . Lack of bladder control   . Lung tumor   . Rheumatoid arthritis ([place])   . Schizoaffective disorder ([place])   . Screening for cervical cancer 07/29/2017  . Seizure [facility])    after brain surgery 2012. last seizure 2013!  [name] Sex counseling 10/26/2015  . Sleep apnea   . 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 Stiffness of both knees 06/22/2015    Past Surgical History:  Procedure Laterality Date  . BRAIN TUMOR EXCISION  2012   benign  . CARDIAC CATHETERIZATION  2008  . CESAREAN SECTION    . CYST REMOVAL HAND    . LUNG LOBECTOMY  1977   benign tumor  . 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 Heart disease Brother   . Depression Mother   . Heart attack Mother   . Stroke Mother   . Alcohol abuse Father   . Stroke Father   . Diabetes Sister   . Diabetes Sister   . Stomach cancer Sister   . Kidney disease Sister   . COPD Brother   . Lung cancer Brother   . Diabetes Brother    Family Psychiatric  History: See H&P Social History:  Social History   Substance and Sexual  Activity  Alcohol Use No  . Alcohol/week: 0.0 oz     Social History   Substance and Sexual Activity  Drug Use No    Social History   Socioeconomic History  . Marital status: Single    Spouse name: None  . Number of children: None  . Years of education: None  . Highest education level: None  Social Needs  . Financial resource strain: Not hard at all  . Food insecurity - worry: Never true  . Food insecurity - inability: Never true  . Transportation needs - medical: No  . Transportation needs - non-medical: No  Occupational History  . None  Tobacco Use  . Smoking status: Never Smoker  . Smokeless tobacco: Never Used  Substance and Sexual Activity  . Alcohol  use: No    Alcohol/week: 0.0 oz  . Drug use: No  . Sexual activity: No  Other Topics Concern  . None  Social History Narrative  . None   Additional Social History:                         Sleep: Good  Appetite:  Good  Current Medications: Current Facility-Administered Medications  Medication Dose Route Frequency Provider Last Rate Last Dose  . alum & mag hydroxide-simeth (MAALOX/MYLANTA) 200-200-20 MG/5ML suspension 30 mL  30 mL Oral Q4H PRN [name], [name], MD      . aspirin EC tablet 81 mg  81 mg Oral QAC breakfast [name], [name], MD   81 mg at 11/14/17 2841  . atenolol (TENORMIN) tablet 100 mg  100 mg Oral Daily Sheelah Ritacco, Tyson Babinski, MD   100 mg at 11/14/17 3244  . benztropine (COGENTIN) tablet 0.5 mg  0.5 mg Oral QHS [name], [name], MD   0.5 mg at 11/13/17 2054  . haloperidol (HALDOL) tablet 10 mg  10 mg Oral QHS Nikesha Kwasny, Tyson Babinski, MD   10 mg at 11/13/17 2054  . [START ON 12/02/2017] haloperidol decanoate (HALDOL DECANOATE) 100 MG/ML injection 50 mg  50 mg Intramuscular Once Jaaziah Schulke R, MD      . hydrOXYzine (ATARAX/VISTARIL) tablet 25 mg  25 mg Oral TID PRN [name], [name], MD      . ibuprofen (ADVIL,MOTRIN) tablet 400 mg  400 mg Oral Q4H PRN Marylin Crosby, MD   400 mg at 11/14/17 0824  . magnesium hydroxide (MILK OF MAGNESIA) suspension 30 mL  30 mL Oral Daily PRN [name], [name], MD      . metFORMIN (GLUCOPHAGE) tablet 500 mg  500 mg Oral BID WC [name], [name], MD   500 mg at 11/14/17 0102  . pantoprazole (PROTONIX) EC tablet 40 mg  40 mg Oral Daily [name], [name], MD   40 mg at 11/14/17 7253  . potassium chloride SA (K-DUR,KLOR-CON) CR tablet 20 mEq  20 mEq Oral Once Ayaana Biondo, Tyson Babinski, MD      . QUEtiapine (SEROQUEL) tablet 25 mg  25 mg Oral QHS PRN Carlin Attridge, Tyson Babinski, MD      . rosuvastatin (CRESTOR) tablet 40 mg  40 mg Oral q1800 [name], [name], MD   40 mg at 11/13/17 1654    Lab Results: No results found for this or any previous visit (from the past 48  hour(s)).  Blood Alcohol level:  Lab Results  Component Value Date   [facility] <10 11/03/2017   ETH <10 66/44/0347    Metabolic Disorder Labs: Lab Results  Component  Value Date   HGBA1C 6.2 (H) 11/05/2017   MPG 131.24 11/05/2017   MPG 131.24 09/09/2017   No results found for: PROLACTIN Lab Results  Component Value Date   CHOL 217 (H) 11/05/2017   TRIG 102 11/05/2017   HDL 34 (L) 11/05/2017   CHOLHDL 6.4 11/05/2017   VLDL 20 11/05/2017   LDLCALC 163 (H) 11/05/2017   LDLCALC 153 (H) 09/09/2017    Physical Findings: AIMS:  , ,  ,  ,    CIWA:    COWS:     Musculoskeletal: Strength & Muscle Tone: within normal limits Gait & Station: normal Patient leans: N/A  Psychiatric Specialty Exam: Physical Exam  Nursing note and vitals reviewed.   Review of Systems  All other systems reviewed and are negative.   Blood pressure 136/79, pulse 66, temperature 97.6 F (36.4 C), temperature source Oral, resp. rate 18, height 5' 1.81" (1.57 m), weight 102.5 kg (226 lb), SpO2 96 %.Body mass index is 41.59 kg/m.  General Appearance: Casual  Eye Contact:  Good  Speech:  Clear and Coherent  Volume:  Normal  Mood:  Euthymic  Affect:  Appropriate  Thought Process:  Coherent and Goal Directed  Orientation:  Full (Time, Place, and Person)  Thought Content:  Paranoid Ideation  Suicidal Thoughts:  No  Homicidal Thoughts:  No  Memory:  Immediate;   Fair  Judgement:  Fair  Insight:  Fair  Psychomotor Activity:  Normal  Concentration:  Concentration: Fair  Recall:  [employer] of Knowledge:  Fair  Language:  Fair  Akathisia:  No      Assets:  Resilience  ADL's:  Intact  Cognition:  WNL  Sleep:  Number of Hours: 7.3     Treatment Plan Summary: 67 yo female admitted due to paranoia. She is showing significant improvements daily. She is tolerating medications. She does have some diarrhea which could be due to Metformin.   Plan: Schizophrenia -Continue Haldol 10 mg qhsShe refuses  another Haldol Dec injection while in hospital -She received Haldol Dec 50 mg on 2/26  HTN -Continue Atenolol 100 mg daily.  -BP good today  DM -Metformin. Will consider switching to XL form due to diarrhea  History of meningioma -CT head done Possible recurrence of meningioma. No mass effect. Appears stable. Discussed case with on call neurosurgeon, Dr. Novella Olive. He took a look at the scan and feels there is possible recurrence and can be followed up with as an outpatient. She can be seen in the neurosurgery clinic at [facility]. She has follow up appointment 4/9  Labs: -Total cholesterol was 217 and hemoglobin A1c was 6.2 -EKG showed QTC of 442  Dispo -She will need psychotropic medication management follow-up appointment-likely with [facility] due to insurancePt lives alone in house. She has supportive familyand I spoke to her sisteryesterday to provide update.She also has case manager helping her with new housing and she was given phone number by CSW.       Marylin Crosby, MD 11/14/2017, 10:40 AM

## 2017-11-15 DIAGNOSIS — F200 Paranoid schizophrenia: Secondary | ICD-10-CM

## 2017-11-15 LAB — GLUCOSE, CAPILLARY: Glucose-Capillary: 105 mg/dL — ABNORMAL HIGH (ref 65–99)

## 2017-11-15 NOTE — Progress Notes (Signed)
[facility] MD Progress Note  11/15/2017 11:57 AM Lauren [name]  MRN:  [id number] Subjective: Follow-up 67 year old woman with schizophrenia.  Patient has no complaints today.  To my pleasant surprise she was quite polite and pleasant this morning.  Said she is feeling well.  Smiled.  Was not hostile or insulting.  She mentions that her left knee hurts but that that is chronic and no different than usual.  She is not paranoid or agitated.  Does not appear to be having any signs of akathisia or stiffness.  Her blood pressure was high this morning much more so than it had been previously.  Not sure why that is.  She is very much looking forward to going home on Tuesday Principal Problem: Schizophrenia [facility]) Diagnosis:   Patient Active Problem List   Diagnosis Date Noted  . Schizophrenia ([place]) [F20.9] 11/04/2017  . Acid reflux [K21.9] 06/29/2015  . Diabetes mellitus type 2, controlled, without complications ([place]) [L38.1] 06/29/2015  . Cardiac murmur [R01.1] 06/29/2015  . Arthritis of knee, degenerative [M17.10] 06/28/2015  . Insomnia w/ sleep apnea [G47.00, G47.30] 03/21/2015  . Anxiety disorder [F41.9] 03/21/2015  . Hypertension [I10] 03/21/2015  . HLD (hyperlipidemia) [E78.5] 03/21/2015  . Urge incontinence [N39.41] 03/21/2015   Total Time spent with patient: 20 minutes  Past Psychiatric History: Long history of schizophrenia.  Multiple hospitalizations.  Tends to get pretty irritable when she is sick.  Past Medical History:  Past Medical History:  Diagnosis Date  . Anxiety   . Apnea, sleep 02/02/2014  . Awareness of heartbeats 02/02/2014  . Breathlessness on exertion 02/02/2014  . Diabetes mellitus   . Encounter for pre-employment examination 06/29/2015  . Excessive sweating 07/05/2015  . Fibromyalgia   . GERD (gastroesophageal reflux disease)   . Gravida 1 10/26/2015   1.    . Heart murmur   . Herniated disc   . Hyperlipidemia   . Hypertension   . Itch of skin 10/26/2015  .  Lack of bladder control   . Lung tumor   . Rheumatoid arthritis ([place])   . Schizoaffective disorder ([place])   . Screening for cervical cancer 07/29/2017  . Seizure [facility])    after brain surgery 2012. last seizure 2013!  [name] Sex counseling 10/26/2015  . Sleep apnea   . 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 Stiffness of both knees 06/22/2015    Past Surgical History:  Procedure Laterality Date  . BRAIN TUMOR EXCISION  2012   benign  . CARDIAC CATHETERIZATION  2008  . CESAREAN SECTION    . CYST REMOVAL HAND    . LUNG LOBECTOMY  1977   benign tumor  . 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 Heart disease Brother   . Depression Mother   . Heart attack Mother   . Stroke Mother   . Alcohol abuse Father   . Stroke Father   . Diabetes Sister   . Diabetes Sister   . Stomach cancer Sister   . Kidney disease Sister   . COPD Brother   . Lung cancer Brother   . Diabetes Brother    Family Psychiatric  History: None known Social History:  Social History   Substance and Sexual Activity  Alcohol Use No  . Alcohol/week: 0.0 oz  Social History   Substance and Sexual Activity  Drug Use No    Social History   Socioeconomic History  . Marital status: Single    Spouse name: None  . Number of children: None  . Years of education: None  . Highest education level: None  Social Needs  . Financial resource strain: Not hard at all  . Food insecurity - worry: Never true  . Food insecurity - inability: Never true  . Transportation needs - medical: No  . Transportation needs - non-medical: No  Occupational History  . None  Tobacco Use  . Smoking status: Never Smoker  . Smokeless tobacco: Never Used  Substance and Sexual Activity  . Alcohol use: No     Alcohol/week: 0.0 oz  . Drug use: No  . Sexual activity: No  Other Topics Concern  . None  Social History Narrative  . None   Additional Social History:                         Sleep: Fair  Appetite:  Fair  Current Medications: Current Facility-Administered Medications  Medication Dose Route Frequency Provider Last Rate Last Dose  . alum & mag hydroxide-simeth (MAALOX/MYLANTA) 200-200-20 MG/5ML suspension 30 mL  30 mL Oral Q4H PRN Natashia Roseman T, MD      . aspirin EC tablet 81 mg  81 mg Oral QAC breakfast Merl Bommarito T, MD   81 mg at 11/15/17 0820  . atenolol (TENORMIN) tablet 100 mg  100 mg Oral Daily McNew, Tyson Babinski, MD   100 mg at 11/15/17 0820  . benztropine (COGENTIN) tablet 0.5 mg  0.5 mg Oral QHS Kyrus Hyde T, MD   0.5 mg at 11/14/17 2122  . haloperidol (HALDOL) tablet 10 mg  10 mg Oral QHS Marylin Crosby, MD   10 mg at 11/14/17 2122  . [START ON 12/02/2017] haloperidol decanoate (HALDOL DECANOATE) 100 MG/ML injection 50 mg  50 mg Intramuscular Once McNew, Holly R, MD      . hydrOXYzine (ATARAX/VISTARIL) tablet 25 mg  25 mg Oral TID PRN Regis Wiland T, MD      . ibuprofen (ADVIL,MOTRIN) tablet 400 mg  400 mg Oral Q4H PRN Marylin Crosby, MD   400 mg at 11/15/17 2778  . magnesium hydroxide (MILK OF MAGNESIA) suspension 30 mL  30 mL Oral Daily PRN Shigeko Manard T, MD      . metFORMIN (GLUCOPHAGE) tablet 500 mg  500 mg Oral BID WC Kevin Mario, [name], MD   500 mg at 11/15/17 0820  . pantoprazole (PROTONIX) EC tablet 40 mg  40 mg Oral Daily Gennaro Lizotte, [name], MD   40 mg at 11/15/17 0820  . potassium chloride SA (K-DUR,KLOR-CON) CR tablet 20 mEq  20 mEq Oral Once McNew, Tyson Babinski, MD      . QUEtiapine (SEROQUEL) tablet 25 mg  25 mg Oral QHS PRN McNew, Tyson Babinski, MD      . rosuvastatin (CRESTOR) tablet 40 mg  40 mg Oral q1800 Fabian Walder, [name], MD   40 mg at 11/14/17 1713    Lab Results:  Results for orders placed or performed during the hospital encounter of 11/04/17 (from the  past 48 hour(s))  Glucose, capillary     Status: Abnormal   Collection Time: 11/15/17  6:58 AM  Result Value Ref Range   Glucose-Capillary 105 (H) 65 - 99 mg/dL    Blood Alcohol level:  Lab Results  Component Value Date   ETH <10 11/03/2017   ETH <10 30/16/0109    Metabolic Disorder Labs: Lab Results  Component Value Date   HGBA1C 6.2 (H) 11/05/2017   MPG 131.24 11/05/2017   MPG 131.24 09/09/2017   No results found for: PROLACTIN Lab Results  Component Value Date   CHOL 217 (H) 11/05/2017   TRIG 102 11/05/2017   HDL 34 (L) 11/05/2017   CHOLHDL 6.4 11/05/2017   VLDL 20 11/05/2017   LDLCALC 163 (H) 11/05/2017   LDLCALC 153 (H) 09/09/2017    Physical Findings: AIMS:  , ,  ,  ,    CIWA:    COWS:     Musculoskeletal: Strength & Muscle Tone: within normal limits Gait & Station: normal Patient leans: N/A  Psychiatric Specialty Exam: Physical Exam  Nursing note and vitals reviewed. Constitutional: She appears well-developed and well-nourished.  HENT:  Head: Normocephalic and atraumatic.  Eyes: Conjunctivae are normal. Pupils are equal, round, and reactive to light.  Neck: Normal range of motion.  Cardiovascular: Regular rhythm and normal heart sounds.  Respiratory: Effort normal. No respiratory distress.  GI: Soft. She exhibits no distension.  Musculoskeletal: Normal range of motion.       Legs: Neurological: She is alert.  Skin: Skin is warm and dry.  Psychiatric: She has a normal mood and affect. Her speech is normal and behavior is normal. Judgment and thought content normal. Cognition and memory are normal.    Review of Systems  Constitutional: Negative.   HENT: Negative.   Eyes: Negative.   Respiratory: Negative.   Cardiovascular: Negative.   Gastrointestinal: Negative.   Musculoskeletal: Positive for joint pain.  Skin: Negative.   Neurological: Negative.   Psychiatric/Behavioral: Negative for depression, hallucinations, memory loss, substance abuse  and suicidal ideas. The patient is not nervous/anxious and does not have insomnia.     Blood pressure (!) 170/98, pulse (!) 116, temperature 98.2 F (36.8 C), temperature source Oral, resp. rate 18, height 5' 1.81" (1.57 m), weight 102.5 kg (226 lb), SpO2 96 %.Body mass index is 41.59 kg/m.  General Appearance: Casual  Eye Contact:  Good  Speech:  Clear and Coherent  Volume:  Normal  Mood:  Euthymic  Affect:  Congruent  Thought Process:  Goal Directed  Orientation:  Full (Time, Place, and Person)  Thought Content:  Logical  Suicidal Thoughts:  No  Homicidal Thoughts:  No  Memory:  Immediate;   Fair Recent;   Fair Remote;   Fair  Judgement:  Fair  Insight:  Fair  Psychomotor Activity:  Decreased  Concentration:  Concentration: Fair  Recall:  [employer] of Knowledge:  Fair  Language:  Fair  Akathisia:  No  Handed:  Right  AIMS (if indicated):     Assets:  Desire for Improvement Financial Resources/Insurance Housing Social Support  ADL's:  Intact  Cognition:  WNL  Sleep:  Number of Hours: 8     Treatment Plan Summary: Daily contact with patient to assess and evaluate symptoms and progress in treatment, Medication management and Plan Mentally she seems to be doing quite well.  Haldol was adjusted and she is tolerating it well.  Moodiness and psychosis seem to be well controlled and as I mentioned she is patiently waiting to go home on Tuesday.  No change to psychiatric treatment.  I noticed that her blood pressure was high this morning although it had not been high most other days so I am not going to jump in  and change her medicine right now.  We can reassess that the next day or 2.  Alethia Berthold, MD 11/15/2017, 11:57 AM

## 2017-11-15 NOTE — Plan of Care (Signed)
  Progressing Activity: Will identify at least one activity in which they can participate 11/15/2017 1553 - Progressing by Jacinto Halim, RN Coping: Ability to identify and develop effective coping behavior will improve 11/15/2017 1553 - Progressing by Jacinto Halim, RN Ability to interact with others will improve 11/15/2017 1553 - Progressing by Jacinto Halim, RN Participation in decision-making will improve 11/15/2017 1553 - Progressing by Jacinto Halim, RN Ability to use eye contact when communicating with others will improve 11/15/2017 1553 - Progressing by Jacinto Halim, RN Self-Concept: Ability to verbalize positive feelings about self will improve 11/15/2017 1553 - Progressing by Jacinto Halim, RN Activity: Sleeping patterns will improve 11/15/2017 1553 - Progressing by Jacinto Halim, RN Pain Managment: General experience of comfort will improve 11/15/2017 1553 - Progressing by Jacinto Halim, RN

## 2017-11-15 NOTE — Progress Notes (Signed)
Patient presents pleasant and cooperative. Medication and group compliant. Appropriate with staff and peers. Denies SI, HI, AVH. C/O pain to knees. PRN given with partial relief. Encouragement and support offered. Safety checks maintained. Pt remains safe on unit with q 15 min checks.

## 2017-11-15 NOTE — [facility] Group Notes (Signed)
LCSW Group Therapy Note  11/15/2017 1:15pm  Type of Therapy and Topic: Group Therapy: Holding on to Grudges   Participation Level: Active   Description of Group:  In this group patients will be asked to explore and define a grudge. Patients will be guided to discuss their thoughts, feelings, and reasons as to why people have grudges. Patients will process the impact grudges have on daily life and identify thoughts and feelings related to holding grudges. Facilitator will challenge patients to identify ways to let go of grudges and the benefits this provides. Patients will be confronted to address why one struggles letting go of grudges. Lastly, patients will identify feelings and thoughts related to what life would look like without grudges. This group will be process-oriented, with patients participating in exploration of their own experiences, giving and receiving support, and processing challenge from other group members.  Therapeutic Goals:  1. Patient will identify specific grudges related to their personal life.  2. Patient will identify feelings, thoughts, and beliefs around grudges.  3. Patient will identify how one releases grudges appropriately.  4. Patient will identify situations where they could have let go of the grudge, but instead chose to hold on.   Summary of Patient Progress: Pt reported she feels "okay". She stated that she held a grudge with her ex-boyfriend and another ex. Pt was able to identify feelings, thoughts and beliefs around grudges. She shared with the group that forgiving and letting go helped her release some of her grudges appropriately. Patient participated and explored her own experiences, by giving and receiving support, and processing challenge from other group members.      Therapeutic Modalities:  Cognitive Behavioral Therapy  Solution Focused Therapy  Motivational Interviewing  Brief Therapy   Dallas Scorsone  [name], LCSW 11/15/2017 3:13 PM

## 2017-11-16 NOTE — Progress Notes (Signed)
n pleasant and cooperative on approach and compliant with medications. Conversation was minimal with Probation officer but was logical , she was visible in the milieu, and no behavioral issues to report on shift at this time.Medications given as prescribed. Safety checks maintained. Pt remains resting in bed eyes closed at this time.

## 2017-11-16 NOTE — [facility] Group Notes (Signed)
LCSW Group Therapy Note 11/16/2017 1:15pm  Type of Therapy and Topic: Group Therapy: Feelings Around Returning Home & Establishing a Supportive Framework and Supporting Oneself When Supports Not Available  Participation Level: Active  Description of Group:  Patients first processed thoughts and feelings about upcoming discharge. These included fears of upcoming changes, lack of change, new living environments, judgements and expectations from others and overall stigma of mental health issues. The group then discussed the definition of a supportive framework, what that looks and feels like, and how do to discern it from an unhealthy non-supportive network. The group identified different types of supports as well as what to do when your family/friends are less than helpful or unavailable  Therapeutic Goals  1. Patient will identify one healthy supportive network that they can use at discharge. 2. Patient will identify one factor of a supportive framework and how to tell it from an unhealthy network. 3. Patient able to identify one coping skill to use when they do not have positive supports from others. 4. Patient will demonstrate ability to communicate their needs through discussion and/or role plays.  Summary of Patient Progress:  Pt reported she feel good. Pt engaged during group session. As patients processed their anxiety about discharge and described healthy supports patient shared she had family anf friend.  Patients identified at least one self-care tool they were willing to use after discharge.   Therapeutic Modalities Cognitive Behavioral Therapy Motivational Interviewing   Briar Sword  [name], LCSW 11/16/2017 12:28 PM

## 2017-11-16 NOTE — Progress Notes (Signed)
[facility] MD Progress Note  11/16/2017 4:00 PM Lauren [name]  MRN:  [id number] Subjective: Patient has no new complaint.  Mood is fine.  Continues to have joint pain but nothing different than usual.  Denies hallucinations denies suicidal thoughts.  She talks however about how she realizes that she does not trust people and that that is something that she has learned in group that she needs to work on.  Looking forward to discharge. Principal Problem: Schizophrenia ([place]) Diagnosis:   Patient Active Problem List   Diagnosis Date Noted  . Schizophrenia ([place]) [F20.9] 11/04/2017  . Acid reflux [K21.9] 06/29/2015  . Diabetes mellitus type 2, controlled, without complications ([place]) [W09.8] 06/29/2015  . Cardiac murmur [R01.1] 06/29/2015  . Arthritis of knee, degenerative [M17.10] 06/28/2015  . Insomnia w/ sleep apnea [G47.00, G47.30] 03/21/2015  . Anxiety disorder [F41.9] 03/21/2015  . Hypertension [I10] 03/21/2015  . HLD (hyperlipidemia) [E78.5] 03/21/2015  . Urge incontinence [N39.41] 03/21/2015   Total Time spent with patient: 20 minutes  Past Psychiatric History: Past history of schizophrenia and noncompliance  Past Medical History:  Past Medical History:  Diagnosis Date  . Anxiety   . Apnea, sleep 02/02/2014  . Awareness of heartbeats 02/02/2014  . Breathlessness on exertion 02/02/2014  . Diabetes mellitus   . Encounter for pre-employment examination 06/29/2015  . Excessive sweating 07/05/2015  . Fibromyalgia   . GERD (gastroesophageal reflux disease)   . Gravida 1 10/26/2015   1.    . Heart murmur   . Herniated disc   . Hyperlipidemia   . Hypertension   . Itch of skin 10/26/2015  . Lack of bladder control   . Lung tumor   . Rheumatoid arthritis ([place])   . Schizoaffective disorder ([place])   . Screening for cervical cancer 07/29/2017  . Seizure [facility])    after brain surgery 2012. last seizure 2013!  [name] Sex counseling 10/26/2015  . Sleep apnea   . 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 Stiffness of both knees 06/22/2015    Past Surgical History:  Procedure Laterality Date  . BRAIN TUMOR EXCISION  2012   benign  . CARDIAC CATHETERIZATION  2008  . CESAREAN SECTION    . CYST REMOVAL HAND    . LUNG LOBECTOMY  1977   benign tumor  . 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 Heart disease Brother   . Depression Mother   . Heart attack Mother   . Stroke Mother   . Alcohol abuse Father   . Stroke Father   . Diabetes Sister   . Diabetes Sister   . Stomach cancer Sister   . Kidney disease Sister   . COPD Brother   . Lung cancer Brother   . Diabetes Brother    Family Psychiatric  History: None Social History:  Social History   Substance and Sexual Activity  Alcohol Use No  . Alcohol/week: 0.0 oz     Social History   Substance and Sexual Activity  Drug Use No    Social History   Socioeconomic History  . Marital status: Single    Spouse name: None  . Number of children: None  . Years of education: None  . Highest education level: None  Social Needs  . Financial  resource strain: Not hard at all  . Food insecurity - worry: Never true  . Food insecurity - inability: Never true  . Transportation needs - medical: No  . Transportation needs - non-medical: No  Occupational History  . None  Tobacco Use  . Smoking status: Never Smoker  . Smokeless tobacco: Never Used  Substance and Sexual Activity  . Alcohol use: No    Alcohol/week: 0.0 oz  . Drug use: No  . Sexual activity: No  Other Topics Concern  . None  Social History Narrative  . None   Additional Social History:                         Sleep: Good  Appetite:  Good  Current Medications: Current Facility-Administered Medications   Medication Dose Route Frequency Provider Last Rate Last Dose  . alum & mag hydroxide-simeth (MAALOX/MYLANTA) 200-200-20 MG/5ML suspension 30 mL  30 mL Oral Q4H PRN Franci Oshana T, MD      . aspirin EC tablet 81 mg  81 mg Oral QAC breakfast Canary Fister, [name], MD   81 mg at 11/16/17 0811  . atenolol (TENORMIN) tablet 100 mg  100 mg Oral Daily McNew, Tyson Babinski, MD   100 mg at 11/16/17 0653  . benztropine (COGENTIN) tablet 0.5 mg  0.5 mg Oral QHS Shavana Calder T, MD   0.5 mg at 11/15/17 2126  . haloperidol (HALDOL) tablet 10 mg  10 mg Oral QHS McNew, Tyson Babinski, MD   10 mg at 11/15/17 2127  . [START ON 12/02/2017] haloperidol decanoate (HALDOL DECANOATE) 100 MG/ML injection 50 mg  50 mg Intramuscular Once McNew, Holly R, MD      . hydrOXYzine (ATARAX/VISTARIL) tablet 25 mg  25 mg Oral TID PRN Adhya Cocco T, MD      . ibuprofen (ADVIL,MOTRIN) tablet 400 mg  400 mg Oral Q4H PRN Marylin Crosby, MD   400 mg at 11/15/17 1610  . magnesium hydroxide (MILK OF MAGNESIA) suspension 30 mL  30 mL Oral Daily PRN Hulet Ehrmann T, MD      . metFORMIN (GLUCOPHAGE) tablet 500 mg  500 mg Oral BID WC Akacia Boltz, [name], MD   500 mg at 11/16/17 0811  . pantoprazole (PROTONIX) EC tablet 40 mg  40 mg Oral Daily Nyiah Pianka, [name], MD   40 mg at 11/16/17 9604  . potassium chloride SA (K-DUR,KLOR-CON) CR tablet 20 mEq  20 mEq Oral Once McNew, Tyson Babinski, MD      . QUEtiapine (SEROQUEL) tablet 25 mg  25 mg Oral QHS PRN McNew, Tyson Babinski, MD      . rosuvastatin (CRESTOR) tablet 40 mg  40 mg Oral q1800 Valentine Barney, [name], MD   40 mg at 11/15/17 1710    Lab Results:  Results for orders placed or performed during the hospital encounter of 11/04/17 (from the past 48 hour(s))  Glucose, capillary     Status: Abnormal   Collection Time: 11/15/17  6:58 AM  Result Value Ref Range   Glucose-Capillary 105 (H) 65 - 99 mg/dL    Blood Alcohol level:  Lab Results  Component Value Date   ETH <10 11/03/2017   ETH <10 54/05/8118    Metabolic  Disorder Labs: Lab Results  Component Value Date   HGBA1C 6.2 (H) 11/05/2017   MPG 131.24 11/05/2017   MPG 131.24 09/09/2017   No results found for: PROLACTIN Lab Results  Component Value Date  CHOL 217 (H) 11/05/2017   TRIG 102 11/05/2017   HDL 34 (L) 11/05/2017   CHOLHDL 6.4 11/05/2017   VLDL 20 11/05/2017   LDLCALC 163 (H) 11/05/2017   LDLCALC 153 (H) 09/09/2017    Physical Findings: AIMS:  , ,  ,  ,    CIWA:    COWS:     Musculoskeletal: Strength & Muscle Tone: within normal limits Gait & Station: normal Patient leans: N/A  Psychiatric Specialty Exam: Physical Exam  Nursing note and vitals reviewed. Constitutional: She appears well-developed and well-nourished.  HENT:  Head: Normocephalic and atraumatic.  Eyes: Conjunctivae are normal. Pupils are equal, round, and reactive to light.  Neck: Normal range of motion.  Cardiovascular: Regular rhythm and normal heart sounds.  Respiratory: Effort normal. No respiratory distress.  GI: Soft.  Musculoskeletal: Normal range of motion.  Neurological: She is alert.  Skin: Skin is warm and dry.  Psychiatric: She has a normal mood and affect. Her speech is normal and behavior is normal. Judgment and thought content normal. Cognition and memory are normal.    Review of Systems  Constitutional: Negative.   HENT: Negative.   Eyes: Negative.   Respiratory: Negative.   Cardiovascular: Negative.   Gastrointestinal: Negative.   Musculoskeletal: Positive for joint pain.  Skin: Negative.   Neurological: Negative.   Psychiatric/Behavioral: Negative.     Blood pressure (!) 154/78, pulse (!) 53, temperature 98 F (36.7 C), temperature source Oral, resp. rate 18, height 5' 1.81" (1.57 m), weight 102.5 kg (226 lb), SpO2 99 %.Body mass index is 41.59 kg/m.  General Appearance: Fairly Groomed  Eye Contact:  Good  Speech:  Clear and Coherent  Volume:  Normal  Mood:  Euthymic  Affect:  Constricted  Thought Process:  Goal  Directed  Orientation:  Full (Time, Place, and Person)  Thought Content:  Logical  Suicidal Thoughts:  No  Homicidal Thoughts:  No  Memory:  Immediate;   Good Recent;   Fair Remote;   Fair  Judgement:  Fair  Insight:  Fair  Psychomotor Activity:  Normal  Concentration:  Concentration: Fair  Recall:  [employer] of Knowledge:  Fair  Language:  Fair  Akathisia:  No  Handed:  Right  AIMS (if indicated):     Assets:  Desire for Improvement Resilience Social Support  ADL's:  Intact  Cognition:  WNL  Sleep:  Number of Hours: 6.75     Treatment Plan Summary: Daily contact with patient to assess and evaluate symptoms and progress in treatment, Medication management and Plan No change to medication management.  Supportive therapy.  Listened with her and acknowledged her concerns about developing better trust.  Patient is looking forward to discharge.  No medicine changes.  Alethia Berthold, MD 11/16/2017, 4:00 PM

## 2017-11-16 NOTE — Plan of Care (Signed)
D: Pt denies SI/HI/AV hallucinations. Pt is pleasant and cooperative. Pt. Has been in milieu with peers and watching television. A: Pt was offered support and encouragement. Pt was given scheduled medications. Pt was encourage to attend groups. Q 15 minute checks were done for safety.  R:Pt attends groups and interacts well with peers and staff. Pt is taking medication. Pt has no complaints.Pt receptive to treatment and safety maintained on unit.    Progressing Activity: Will identify at least one activity in which they can participate 11/16/2017 1536 - Progressing by Jolene Provost, RN Coping: Ability to identify and develop effective coping behavior will improve 11/16/2017 1536 - Progressing by Jolene Provost, RN Ability to interact with others will improve 11/16/2017 1536 - Progressing by Jolene Provost, RN Participation in decision-making will improve 11/16/2017 1536 - Progressing by Jolene Provost, RN Ability to use eye contact when communicating with others will improve 11/16/2017 1536 - Progressing by Jolene Provost, RN Self-Concept: Ability to verbalize positive feelings about self will improve 11/16/2017 1536 - Progressing by Jolene Provost, RN Activity: Sleeping patterns will improve 11/16/2017 1536 - Progressing by Jolene Provost, RN Pain Managment: General experience of comfort will improve 11/16/2017 1536 - Progressing by Jolene Provost, RN

## 2017-11-17 MED ORDER — LISINOPRIL 10 MG PO TABS: 10.0000 mg | ORAL_TABLET | Freq: Every day | ORAL | 0 refills | Status: DC

## 2017-11-17 MED ORDER — ATENOLOL 50 MG PO TABS: 50.0000 mg | ORAL_TABLET | Freq: Every day | ORAL | Status: DC

## 2017-11-17 MED ORDER — LISINOPRIL 20 MG PO TABS
10.0000 mg | ORAL_TABLET | Freq: Every day | ORAL | Status: DC
  Administered 2017-11-17 – 2017-11-18 (×2): 10 mg via ORAL
  Filled 2017-11-17 (×2): qty 1

## 2017-11-17 MED ORDER — METFORMIN HCL 500 MG PO TABS: 500.0000 mg | ORAL_TABLET | Freq: Two times a day (BID) | ORAL | 0 refills | Status: DC

## 2017-11-17 MED ORDER — ROSUVASTATIN CALCIUM 40 MG PO TABS: 40.0000 mg | ORAL_TABLET | Freq: Every day | ORAL | 0 refills | Status: DC

## 2017-11-17 MED ORDER — BENZTROPINE MESYLATE 0.5 MG PO TABS: 0.5000 mg | ORAL_TABLET | Freq: Every day | ORAL | 0 refills | Status: DC

## 2017-11-17 MED ORDER — HALOPERIDOL DECANOATE 100 MG/ML IM SOLN: 50.0000 mg | INTRAMUSCULAR | 0 refills | Status: DC

## 2017-11-17 MED ORDER — HALOPERIDOL 10 MG PO TABS: 10.0000 mg | ORAL_TABLET | Freq: Every day | ORAL | 0 refills | Status: DC

## 2017-11-17 NOTE — Plan of Care (Signed)
Patient is improving in health and participating minimally in group activities appear well rested, support and encourage accorded to patient , 15 minute safety rounds is in progress, patient denies any suicidal ideation and any AVH. Progressing Activity: Will identify at least one activity in which they can participate 11/17/2017 2030 - Progressing by Clemens Catholic, RN Coping: Ability to identify and develop effective coping behavior will improve 11/17/2017 2030 - Progressing by Clemens Catholic, RN Ability to interact with others will improve 11/17/2017 2030 - Progressing by Clemens Catholic, RN Participation in decision-making will improve 11/17/2017 2030 - Progressing by Clemens Catholic, RN Ability to use eye contact when communicating with others will improve 11/17/2017 2030 - Progressing by Clemens Catholic, RN Self-Concept: Ability to verbalize positive feelings about self will improve 11/17/2017 2030 - Progressing by Clemens Catholic, RN Activity: Sleeping patterns will improve 11/17/2017 2030 - Progressing by Clemens Catholic, RN

## 2017-11-17 NOTE — Progress Notes (Addendum)
Lauren [name] was in room at the beginning of this shift. Pleasant and cooperative. Alert and oriented and denying SI/HI/hallucinations. Had no concerns. Patient went to the dayroom and attend group then had a snack. Patient received her bedtime medications. Did not have any concern. Currently in bed sleeping and appears to be comfortable. Support and encouragements provided. Staff continue to monitor for safety.

## 2017-11-17 NOTE — Plan of Care (Signed)
Patient pleasant and cooperative in the unit.Looking forward for discharge tomorrow.Participated in groups.No issues verbalized.

## 2017-11-17 NOTE — BHH Group Notes (Signed)
[facility] Group Notes:  (Nursing/MHT/Case Management/Adjunct)  Date:  11/17/2017  Time:  8:56 PM  Type of Therapy:  Group Therapy  Participation Level:  Active  Participation Quality:  Appropriate  Affect:  Appropriate  Cognitive:  Appropriate  Insight:  Appropriate  Engagement in Group:  Engaged  Modes of Intervention:  Discussion  Summary of Progress/Problems:  Kandis Fantasia 11/17/2017, 8:56 PM

## 2017-11-17 NOTE — Plan of Care (Signed)
Cooperative and compliant with treatment 

## 2017-11-17 NOTE — Progress Notes (Signed)
[facility] MD Progress Note  11/17/2017 2:29 PM Lauren [name]  MRN:  [id number]   Subjective:  Pt states that she is doing well today. She has bright affect and has been very calm and pleasant on the unit. Paranoia has improved significantly. She denies AH,VH, SI. She feels her medications have been very effective and plans to continue taking them. Discussed low HR and elevated BP. She agrees to stop atenolol and start Lisinopril which she was supposed to be on in the past. She is organized and goal directed. She has been attending groups and participated well.   Principal Problem: Schizophrenia ([place]) Diagnosis:   Patient Active Problem List   Diagnosis Date Noted  . Schizophrenia ([place]) [F20.9] 11/04/2017    Priority: High  . Acid reflux [K21.9] 06/29/2015  . Diabetes mellitus type 2, controlled, without complications ([place]) [O84.1] 06/29/2015  . Cardiac murmur [R01.1] 06/29/2015  . Arthritis of knee, degenerative [M17.10] 06/28/2015  . Insomnia w/ sleep apnea [G47.00, G47.30] 03/21/2015  . Anxiety disorder [F41.9] 03/21/2015  . Hypertension [I10] 03/21/2015  . HLD (hyperlipidemia) [E78.5] 03/21/2015  . Urge incontinence [N39.41] 03/21/2015   Total Time spent with patient: 20 minutes  Past Psychiatric History: See H&P  Past Medical History:  Past Medical History:  Diagnosis Date  . Anxiety   . Apnea, sleep 02/02/2014  . Awareness of heartbeats 02/02/2014  . Breathlessness on exertion 02/02/2014  . Diabetes mellitus   . Encounter for pre-employment examination 06/29/2015  . Excessive sweating 07/05/2015  . Fibromyalgia   . GERD (gastroesophageal reflux disease)   . Gravida 1 10/26/2015   1.    . Heart murmur   . Herniated disc   . Hyperlipidemia   . Hypertension   . Itch of skin 10/26/2015  . Lack of bladder control   . Lung tumor   . Rheumatoid arthritis ([place])   . Schizoaffective disorder ([place])   . Screening for cervical cancer 07/29/2017  . Seizure [facility])    after  brain surgery 2012. last seizure 2013!  [name] Sex counseling 10/26/2015  . Sleep apnea   . 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 Stiffness of both knees 06/22/2015    Past Surgical History:  Procedure Laterality Date  . BRAIN TUMOR EXCISION  2012   benign  . CARDIAC CATHETERIZATION  2008  . CESAREAN SECTION    . CYST REMOVAL HAND    . LUNG LOBECTOMY  1977   benign tumor  . 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 Heart disease Brother   . Depression Mother   . Heart attack Mother   . Stroke Mother   . Alcohol abuse Father   . Stroke Father   . Diabetes Sister   . Diabetes Sister   . Stomach cancer Sister   . Kidney disease Sister   . COPD Brother   . Lung cancer Brother   . Diabetes Brother    Family Psychiatric  History: See H&P Social History:  Social History   Substance and Sexual Activity  Alcohol Use No  . Alcohol/week: 0.0 oz     Social History   Substance and Sexual Activity  Drug Use No    Social History   Socioeconomic History  . Marital status: Single  Spouse name: None  . Number of children: None  . Years of education: None  . Highest education level: None  Social Needs  . Financial resource strain: Not hard at all  . Food insecurity - worry: Never true  . Food insecurity - inability: Never true  . Transportation needs - medical: No  . Transportation needs - non-medical: No  Occupational History  . None  Tobacco Use  . Smoking status: Never Smoker  . Smokeless tobacco: Never Used  Substance and Sexual Activity  . Alcohol use: No    Alcohol/week: 0.0 oz  . Drug use: No  . Sexual activity: No  Other Topics Concern  . None  Social History Narrative  . None   Additional Social History:                          Sleep: Good  Appetite:  Good  Current Medications: Current Facility-Administered Medications  Medication Dose Route Frequency Provider Last Rate Last Dose  . alum & mag hydroxide-simeth (MAALOX/MYLANTA) 200-200-20 MG/5ML suspension 30 mL  30 mL Oral Q4H PRN [name], [name], MD      . aspirin EC tablet 81 mg  81 mg Oral QAC breakfast [name], [name], MD   81 mg at 11/17/17 1017  . benztropine (COGENTIN) tablet 0.5 mg  0.5 mg Oral QHS [name], [name], MD   0.5 mg at 11/16/17 2204  . haloperidol (HALDOL) tablet 10 mg  10 mg Oral QHS Jerren Flinchbaugh, Tyson Babinski, MD   10 mg at 11/16/17 2204  . [START ON 12/02/2017] haloperidol decanoate (HALDOL DECANOATE) 100 MG/ML injection 50 mg  50 mg Intramuscular Once Haven Foss R, MD      . hydrOXYzine (ATARAX/VISTARIL) tablet 25 mg  25 mg Oral TID PRN [name], [name], MD      . ibuprofen (ADVIL,MOTRIN) tablet 400 mg  400 mg Oral Q4H PRN Marylin Crosby, MD   400 mg at 11/17/17 1217  . lisinopril (PRINIVIL,ZESTRIL) tablet 10 mg  10 mg Oral Daily Roshawn Ayala, Tyson Babinski, MD   10 mg at 11/17/17 1150  . magnesium hydroxide (MILK OF MAGNESIA) suspension 30 mL  30 mL Oral Daily PRN [name], [name], MD      . metFORMIN (GLUCOPHAGE) tablet 500 mg  500 mg Oral BID WC [name], [name], MD   500 mg at 11/17/17 0806  . pantoprazole (PROTONIX) EC tablet 40 mg  40 mg Oral Daily [name], [name], MD   40 mg at 11/17/17 0806  . potassium chloride SA (K-DUR,KLOR-CON) CR tablet 20 mEq  20 mEq Oral Once Miriana Gaertner, Tyson Babinski, MD      . QUEtiapine (SEROQUEL) tablet 25 mg  25 mg Oral QHS PRN Yashua Bracco, Tyson Babinski, MD      . rosuvastatin (CRESTOR) tablet 40 mg  40 mg Oral q1800 [name], [name], MD   40 mg at 11/16/17 1721    Lab Results: No results found for this or any previous visit (from the past 48 hour(s)).  Blood Alcohol level:  Lab Results  Component Value Date   ETH <10 11/03/2017   ETH <10 51/10/5850    Metabolic Disorder Labs: Lab Results  Component Value Date   HGBA1C 6.2 (H) 11/05/2017    MPG 131.24 11/05/2017   MPG 131.24 09/09/2017   No results found for: PROLACTIN Lab Results  Component Value Date   CHOL 217 (H) 11/05/2017   TRIG 102 11/05/2017  HDL 34 (L) 11/05/2017   CHOLHDL 6.4 11/05/2017   VLDL 20 11/05/2017   LDLCALC 163 (H) 11/05/2017   LDLCALC 153 (H) 09/09/2017    Physical Findings: AIMS:  , ,  ,  ,    CIWA:    COWS:     Musculoskeletal: Strength & Muscle Tone: within normal limits Gait & Station: normal Patient leans: N/A  Psychiatric Specialty Exam: Physical Exam  Nursing note and vitals reviewed.   Review of Systems  All other systems reviewed and are negative.   Blood pressure (!) 158/90, pulse (!) 58, temperature 98.2 F (36.8 C), temperature source Oral, resp. rate 18, height 5' 1.81" (1.57 m), weight 102.5 kg (226 lb), SpO2 98 %.Body mass index is 41.59 kg/m.  General Appearance: Casual  Eye Contact:  Good  Speech:  Clear and Coherent  Volume:  Normal  Mood:  Euthymic  Affect:  Congruent  Thought Process:  Coherent and Goal Directed  Orientation:  Full  Thought Content:  Logical  Suicidal Thoughts:  No  Homicidal Thoughts:  No  Memory:  Immediate;   Fair  Judgement:  Fair  Insight:  Fair  Psychomotor Activity:  Normal  Concentration:  Concentration: Fair  Recall:  [employer] of Knowledge:  Fair  Language:  Fair  Akathisia:  No      Assets:  Communication Skills Resilience  ADL's:  Intact  Cognition:  WNL  Sleep:  Number of Hours: 6.15     Treatment Plan Summary: 67 yo female admitted due to paranoia and decompensation due to medication noncompliance. She has improved significantly and tolerating medications. Paranoia has resolved.  Plan:   Schizophrenia -Continue Haldol 10 mg qhsShe refuses another Haldol Dec injectionwhile in hospital -She received Haldol Dec 50 mg on 2/26  HTN -Pt bradycardic. Will stop Atenolol -Start Lisinopril 10 mg daily  DM   History of meningioma -CT head done Possible  recurrence of meningioma. No mass effect. Appears stable. Discussed case with on call neurosurgeon, Dr. Novella Olive. He took a look at the scan and feels there is possible recurrence and can be followed up with as an outpatient. She can be seen in the neurosurgery clinic at [facility].She has follow up appointment 4/9  Labs: -Total cholesterol was 217 and hemoglobin A1c was 6.2 -EKG showed QTC of 442  Dispo -She will need psychotropic medication management follow-up appointment-likely with [facility] due to insurancePt lives alone in house. ate.She also has case manager helping her with new housing and she was given phone number by CSW.       Marylin Crosby, MD 11/17/2017, 2:29 PM

## 2017-11-17 NOTE — BHH Group Notes (Signed)
[facility] Group Notes:  (Nursing/MHT/Case Management/Adjunct)  Date:  11/17/2017  Time:  3:00 PM  Type of Therapy:  Psychoeducational Skills  Participation Level:  Minimal  Participation Quality:  Appropriate  Affect:  Appropriate  Cognitive:  Appropriate  Insight:  Appropriate  Engagement in Group:  Lacking  Modes of Intervention:  Socialization  Summary of Progress/Problems:  Lauren [name] 11/17/2017, 3:00 PM

## 2017-11-18 MED ORDER — LISINOPRIL 20 MG PO TABS: 20.0000 mg | ORAL_TABLET | Freq: Every day | ORAL | 0 refills | Status: DC

## 2017-11-18 NOTE — Progress Notes (Signed)
Patient denies SI/HI, denies A/V hallucinations. Patient verbalizes understanding of discharge instructions, follow up care and prescriptions. Patient given all belongings from  locker. Patient escorted out by staff, transported by family. 

## 2017-11-18 NOTE — Progress Notes (Signed)
  [facility] Adult Case Management Discharge Plan :  Will you be returning to the same living situation after discharge:  Yes,  Back home At discharge, do you have transportation home?: Yes,  Sister will come and transport patient home Do you have the ability to pay for your medications: Yes,  Insurance  Release of information consent forms completed and in the chart;  Patient's signature needed at discharge.  Patient to Follow up at: Follow-up Information    [facility], [facility]. Go on 11/19/2017.   Why:  Please attend your follow up appointment on Wednesday 3/12/ 2019 at 8am. Please bring your insurance information and identification. Thank you. Contact information: [place] [address] [phone]        [facility]. Go on 12/16/2017.   Why:  Please attend your appoitment on  Tuesday April 9th at 10:30am to meet with Dr. Lacinda Axon. Thank you! Contact information: [place] [address] [phone]        Roselee Nova, MD. Schedule an appointment as soon as possible for a visit in 1 week(s).   Specialty:  Family Medicine Contact information: [address] [place] [address] [phone]           Next level of care provider has access to [place] and Suicide Prevention discussed: Velta Addison  Marcial Pacas, Sister, [phone]  Have you used any form of tobacco in the last 30 days? (Cigarettes, Smokeless Tobacco, Cigars, and/or Pipes): No  Has patient been referred to the Quitline?: N/A patient is not a smoker  Patient has been referred for addiction treatment: N/A  Darin Engels, LCSW 11/18/2017, 9:47 AM

## 2017-11-18 NOTE — BHH Suicide Risk Assessment (Signed)
[facility] Discharge Suicide Risk Assessment   Principal Problem: Schizophrenia [facility]) Discharge Diagnoses:  Patient Active Problem List   Diagnosis Date Noted  . Schizophrenia ([place]) [F20.9] 11/04/2017    Priority: High  . Acid reflux [K21.9] 06/29/2015  . Diabetes mellitus type 2, controlled, without complications ([place]) [U20.2] 06/29/2015  . Cardiac murmur [R01.1] 06/29/2015  . Arthritis of knee, degenerative [M17.10] 06/28/2015  . Insomnia w/ sleep apnea [G47.00, G47.30] 03/21/2015  . Anxiety disorder [F41.9] 03/21/2015  . Hypertension [I10] 03/21/2015  . HLD (hyperlipidemia) [E78.5] 03/21/2015  . Urge incontinence [N39.41] 03/21/2015   Mental Status Per Nursing Assessment::   On Admission:     Demographic Factors:  Living alone and Unemployed  Loss Factors: Decline in physical health  Historical Factors: NA  Risk Reduction Factors:   Positive social support, Positive therapeutic relationship and Positive coping skills or problem solving skills  Continued Clinical Symptoms:  None Cognitive Features That Contribute To Risk:  None    Suicide Risk:  Minimal: No identifiable suicidal ideation.   Follow-up Information    [facility]. Go on 11/19/2017.   Why:  Please attend your follow up appointment on Wednesday 3/12/ 2019 at 8am. Please bring your insurance information and identification. Thank you. Contact information: [place] [address] [phone]        [facility]. Go on 12/16/2017.   Why:  Please attend your appoitment on  Tuesday April 9th at 10:30am to meet with Dr. Lacinda Axon. Thank you! Contact information: [place] [address] [phone]        Roselee Nova, MD. Schedule an appointment as soon as possible for a visit in 1 week(s).   Specialty:  Family Medicine Contact information: [address] [place] [address] [phone]           Plan Of  Care/Follow-up recommendations: Follow up with Lindon Romp, MD 11/18/2017, 11:07 AM

## 2017-11-18 NOTE — BHH Group Notes (Addendum)
  11/18/2017  Time: 0900  Type of Therapy and Topic: Group Therapy: Goals Group: SMART Goals   Participation Level:  Active   Description of Group:   The purpose of a daily goals group is to assist and guide patients in setting recovery/wellness-related goals. The objective is to set goals as they relate to the crisis in which they were admitted. Patients will be using SMART goal modalities to set measurable goals. Characteristics of realistic goals will be discussed and patients will be assisted in setting and processing how one will reach their goal. Facilitator will also assist patients in applying interventions and coping skills learned in psycho-education groups to the SMART goal and process how one will achieve defined goal.   Therapeutic Goals:  -Patients will develop and document one goal related to or their crisis in which brought them into treatment.  -Patients will be guided by LCSW using SMART goal setting modality in how to set a measurable, attainable, realistic and time sensitive goal.  -Patients will process barriers in reaching goal.  -Patients will process interventions in how to overcome and successful in reaching goal.   Patient's Goal:  Pt continues to work towards their tx goals but has not yet reached them. Pt was able to appropriately participate in group discussion, and was able to offer support/validation to other group members. Pt reported her goal is to, "run at least three errands after discharge to organize my schedule, by the end of the day."   Therapeutic Modalities:  Motivational Interviewing  Cognitive Behavioral Therapy  Crisis Intervention Model  SMART goals setting   Alden Hipp, MSW, LCSW 11/18/2017 9:34 AM
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
## 2017-11-25 ENCOUNTER — Encounter: Payer: Self-pay | Admitting: Family Medicine

## 2017-11-25 ENCOUNTER — Ambulatory Visit (INDEPENDENT_AMBULATORY_CARE_PROVIDER_SITE_OTHER): Payer: Medicare Other | Admitting: Family Medicine

## 2017-11-25 ENCOUNTER — Telehealth: Payer: Self-pay | Admitting: Family Medicine

## 2017-11-25 VITALS — BP 132/68 | HR 73 | Temp 97.6°F | Resp 18 | Ht 66.0 in | Wt 240.5 lb

## 2017-11-25 DIAGNOSIS — I700 Atherosclerosis of aorta: Secondary | ICD-10-CM | POA: Insufficient documentation

## 2017-11-25 DIAGNOSIS — I2510 Atherosclerotic heart disease of native coronary artery without angina pectoris: Secondary | ICD-10-CM | POA: Diagnosis not present

## 2017-11-25 DIAGNOSIS — F200 Paranoid schizophrenia: Secondary | ICD-10-CM | POA: Diagnosis not present

## 2017-11-25 DIAGNOSIS — I2584 Coronary atherosclerosis due to calcified coronary lesion: Secondary | ICD-10-CM

## 2017-11-25 DIAGNOSIS — E876 Hypokalemia: Secondary | ICD-10-CM

## 2017-11-25 DIAGNOSIS — E782 Mixed hyperlipidemia: Secondary | ICD-10-CM

## 2017-11-25 DIAGNOSIS — D329 Benign neoplasm of meninges, unspecified: Secondary | ICD-10-CM

## 2017-11-25 DIAGNOSIS — I10 Essential (primary) hypertension: Secondary | ICD-10-CM | POA: Diagnosis not present

## 2017-11-25 DIAGNOSIS — E119 Type 2 diabetes mellitus without complications: Secondary | ICD-10-CM

## 2017-11-25 LAB — MAGNESIUM: Magnesium: 1.5 mg/dL (ref 1.5–2.5)

## 2017-11-25 LAB — POTASSIUM: Potassium: 3.5 mmol/L (ref 3.5–5.3)

## 2017-11-25 NOTE — Progress Notes (Signed)
I called Lauren [name] at [facility] and was unable to reach her. I left a voice message for her to return call in regards to patient needing transportation to Psychiatry for her medication.

## 2017-11-25 NOTE — Telephone Encounter (Signed)
Please call Otilio Connors again at the [facility] so she can work on getting the patient an appointment with her psychiatrist. She missed her first appointment with them and we need their name and setting her up an appointment with psychiatrist along with a ride to their office.

## 2017-11-25 NOTE — Progress Notes (Signed)
Name: Lauren [name]   MRN: [id number]    DOB: 10/01/1950   Date:11/25/2017       Progress Note  Subjective  Chief Complaint  Chief Complaint  Patient presents with  . Hospitalization Follow-up    Patient went in on the 11/03/17 for stress and was discharged on 11/04/17.  . Paranoid    Patient used to see psychiatrist Dr. Einar Grad but is transferring care.     HPI  Hospital follow up: she has schizophrenia and stopped taking medication for one month prior to admission - she states shortage from pharmacy. She was very paranoid and confused, had stopped eating for one week. She also lost follow up with psychiatrist. She was taken to [facility] on 11/04/2017 by her sister and was admitted until  March. She was supposed to continue on Haldol injections but she missed her appointment last week. She states she has a Education officer, museum Rory Percy but she does not have her contact information). She states the haldol but is not helping with her sleep.   Meningioma: history of surgery and CT done at last admission showed recurrence, she will follow up with Dr. Lacinda Axon.  Aorta atherosclerosis: found on  CT chest done Nov 2018. She is on statin and aspirin daily.   DMII: had labs done while at [facility], taking metformin last hgbA1C was at goal,   Low potassium and magnesium: hctz dc during hospital stay and is only on lisinopril for bp control, we will recheck labs.   Reconciled medications with patient today   HTN: she states she has been taking Atenolol even though on dc summary she was advised to stop, off HCTZ and seroquel   Patient Active Problem List   Diagnosis Date Noted  . Meningioma ([place]) 11/25/2017  . Atherosclerosis of aorta ([place]) 11/25/2017  . Calcification of coronary artery 11/25/2017  . Schizophrenia ([place]) 11/04/2017  . Acid reflux 06/29/2015  . Diabetes mellitus type 2, controlled, without complications ([place]) 21/22/4825  . Cardiac murmur 06/29/2015  . Arthritis of knee, degenerative  06/28/2015  . Insomnia w/ sleep apnea 03/21/2015  . Anxiety disorder 03/21/2015  . Hypertension 03/21/2015  . HLD (hyperlipidemia) 03/21/2015  . Urge incontinence 03/21/2015    Past Surgical History:  Procedure Laterality Date  . BRAIN TUMOR EXCISION  2012   benign  . CARDIAC CATHETERIZATION  2008  . CESAREAN SECTION    . CYST REMOVAL HAND    . LUNG LOBECTOMY  1977   benign tumor  . TOTAL KNEE ARTHROPLASTY Left 01/28/2017   Procedure: TOTAL KNEE ARTHROPLASTY;  Surgeon: Corky Mull, MD;  Location: [facility] ORS;  Service: Orthopedics;  Laterality: Left;    Family History  Problem Relation Age of Onset  . Heart disease Brother   . Depression Mother   . Heart attack Mother   . Stroke Mother   . Alcohol abuse Father   . Stroke Father   . Diabetes Sister   . Diabetes Sister   . Stomach cancer Sister   . Kidney disease Sister   . COPD Brother   . Lung cancer Brother   . Diabetes Brother     Social History   Socioeconomic History  . Marital status: Single    Spouse name: Not on file  . Number of children: Not on file  . Years of education: Not on file  . Highest education level: Not on file  Social Needs  . Financial resource strain: Not hard at all  . Food  insecurity - worry: Never true  . Food insecurity - inability: Never true  . Transportation needs - medical: No  . Transportation needs - non-medical: No  Occupational History  . Not on file  Tobacco Use  . Smoking status: Never Smoker  . Smokeless tobacco: Never Used  Substance and Sexual Activity  . Alcohol use: No    Alcohol/week: 0.0 oz  . Drug use: No  . Sexual activity: No  Other Topics Concern  . Not on file  Social History Narrative  . Not on file     Current Outpatient Medications:  .  aspirin EC 81 MG tablet, Take 81 mg by mouth daily before breakfast. , Disp: , Rfl:  .  benztropine (COGENTIN) 0.5 MG tablet, Take 1 tablet (0.5 mg total) by mouth at bedtime., Disp: 60 tablet, Rfl: 0 .   Capsaicin (ZOSTRIX EX), Apply 1 application topically 4 (four) times daily as needed (for knee pain.)., Disp: , Rfl:  .  haloperidol (HALDOL) 10 MG tablet, Take 1 tablet (10 mg total) by mouth at bedtime., Disp: 30 tablet, Rfl: 0 .  ibuprofen (ADVIL,MOTRIN) 200 MG tablet, Take 600 mg by mouth every 8 (eight) hours as needed (for knee pain.)., Disp: , Rfl:  .  lisinopril (PRINIVIL,ZESTRIL) 20 MG tablet, Take 1 tablet (20 mg total) by mouth daily., Disp: 30 tablet, Rfl: 0 .  Lotion Base LOTN, Apply 1 application topically 4 (four) times daily as needed (for knee pain.). Two [employer] Goats Essential Lotion (Aloe Vera, Almond Oil, and 6 natural anti-inflammatory essential oils; [place] Chamomile, [place], Oak Leaf, Sorrel, Peppermint and [place]), Disp: , Rfl:  .  metFORMIN (GLUCOPHAGE) 500 MG tablet, Take 1 tablet (500 mg total) by mouth 2 (two) times daily with a meal., Disp: 60 tablet, Rfl: 0 .  omeprazole (PRILOSEC) 20 MG capsule, TAKE 1 CAPSULE BY MOUTH  DAILY, Disp: 90 capsule, Rfl: 0 .  rosuvastatin (CRESTOR) 40 MG tablet, Take 1 tablet (40 mg total) by mouth daily., Disp: 30 tablet, Rfl: 0 .  Bioflavonoid Products (VITAMIN C) CHEW, Chew 1 tablet by mouth daily as needed (for immune system support)., Disp: , Rfl:  .  [START ON 12/02/2017] haloperidol decanoate (HALDOL DECANOATE) 100 MG/ML injection, Inject 0.5 mLs (50 mg total) into the muscle every 28 (twenty-eight) days. Next injection due on 12/02/17 (Patient not taking: Reported on 11/25/2017), Disp: 1 mL, Rfl: 0  Allergies  Allergen Reactions  . Percocet [Oxycodone-Acetaminophen] Diarrhea, Nausea And Vomiting and Nausea Only  . Tramadol Hcl Diarrhea, Nausea And Vomiting and Nausea Only  . Vicodin [Hydrocodone-Acetaminophen] Diarrhea, Nausea And Vomiting and Nausea Only     ROS  Constitutional: Negative for fever or weight change.  Respiratory: Negative for cough and shortness of breath.   Cardiovascular: Negative for chest pain  or palpitations.  Gastrointestinal: Negative for abdominal pain, no bowel changes.  Musculoskeletal: Negative for gait problem or joint swelling.  Skin: Negative for rash.  Neurological: Negative for dizziness or headache.  No other specific complaints in a complete review of systems (except as listed in HPI above).  Objective  Vitals:   11/25/17 1510  BP: 132/68  Pulse: 73  Resp: 18  Temp: 97.6 F (36.4 C)  TempSrc: Oral  SpO2: 96%  Weight: 240 lb 8 oz (109.1 kg)  Height: 5' 6"  (1.676 m)    Body mass index is 38.82 kg/m.  Physical Exam  Constitutional: Patient appears well-developed and well-nourished. Obese  No distress.  HEENT: head atraumatic,  normocephalic, pupils equal and reactive to light,neck supple, throat within normal limits Cardiovascular: Normal rate, regular rhythm and normal heart sounds.  No murmur heard. No BLE edema. Pulmonary/Chest: Effort normal and breath sounds normal. No respiratory distress. Abdominal: Soft.  There is no tenderness. Psychiatric: Patient has a normal mood and affect. behavior is normal.   Recent Results (from the past 2160 hour(s))  Comprehensive metabolic panel     Status: Abnormal   Collection Time: 09/06/17 11:17 AM  Result Value Ref Range   Sodium 138 135 - 145 mmol/L   Potassium 3.6 3.5 - 5.1 mmol/L   Chloride 104 101 - 111 mmol/L   CO2 24 22 - 32 mmol/L   Glucose, Bld 110 (H) 65 - 99 mg/dL   BUN 13 6 - 20 mg/dL   Creatinine, Ser 0.79 0.44 - 1.00 mg/dL   Calcium 10.0 8.9 - 10.3 mg/dL   Total Protein 8.3 (H) 6.5 - 8.1 g/dL   Albumin 4.6 3.5 - 5.0 g/dL   AST 30 15 - 41 U/L   ALT 24 14 - 54 U/L   Alkaline Phosphatase 70 38 - 126 U/L   Total Bilirubin 1.2 0.3 - 1.2 mg/dL   GFR calc non Af Amer >60 >60 mL/min   GFR calc Af Amer >60 >60 mL/min    Comment: (NOTE) The eGFR has been calculated using the CKD EPI equation. This calculation has not been validated in all clinical situations. eGFR's persistently <60 mL/min  signify possible Chronic Kidney Disease.    Anion gap 10 5 - 15    Comment: Performed at [facility], [address] [place], [place] [address]  Ethanol     Status: None   Collection Time: 09/06/17 11:17 AM  Result Value Ref Range   Alcohol, Ethyl (B) <10 <10 mg/dL    Comment:        LOWEST DETECTABLE LIMIT FOR SERUM ALCOHOL IS 10 mg/dL FOR MEDICAL PURPOSES ONLY Performed at [facility], [address] [place], [place] [address]   Salicylate level     Status: None   Collection Time: 09/06/17 11:17 AM  Result Value Ref Range   Salicylate Lvl <3.5 2.8 - 30.0 mg/dL    Comment: Performed at [facility], [address] [place], [place] [address]  Acetaminophen level     Status: Abnormal   Collection Time: 09/06/17 11:17 AM  Result Value Ref Range   Acetaminophen (Tylenol), Serum <10 (L) 10 - 30 ug/mL    Comment:        THERAPEUTIC CONCENTRATIONS VARY SIGNIFICANTLY. A RANGE OF 10-30 ug/mL MAY BE AN EFFECTIVE CONCENTRATION FOR MANY PATIENTS. HOWEVER, SOME ARE BEST TREATED AT CONCENTRATIONS OUTSIDE THIS RANGE. ACETAMINOPHEN CONCENTRATIONS >150 ug/mL AT 4 HOURS AFTER INGESTION AND >50 ug/mL AT 12 HOURS AFTER INGESTION ARE OFTEN ASSOCIATED WITH TOXIC REACTIONS. Performed at [facility], [address] [place], [place] [address]   cbc     Status: Abnormal   Collection Time: 09/06/17 11:17 AM  Result Value Ref Range   WBC 5.1 3.6 - 11.0 K/uL   RBC 4.63 3.80 - 5.20 MIL/uL   Hemoglobin 14.3 12.0 - 16.0 g/dL   HCT 43.6 35.0 - 47.0 %   MCV 94.3 80.0 - 100.0 fL   MCH 30.8 26.0 - 34.0 pg   MCHC 32.7 32.0 - 36.0 g/dL   RDW 15.2 (H) 11.5 - 14.5 %   Platelets 286 150 - 440 K/uL    Comment: Performed  at [facility], [address] [place], [place] [address]  Glucose, capillary     Status: None   Collection Time: 09/06/17  1:36 PM  Result Value Ref Range   Glucose-Capillary 92 65 - 99 mg/dL  Urine Drug Screen, Qualitative      Status: Abnormal   Collection Time: 09/08/17  5:33 PM  Result Value Ref Range   Tricyclic, Ur Screen NONE DETECTED NONE DETECTED   Amphetamines, Ur Screen NONE DETECTED NONE DETECTED   MDMA (Ecstasy)Ur Screen NONE DETECTED NONE DETECTED   Cocaine Metabolite,Ur [place] NONE DETECTED NONE DETECTED   Opiate, Ur Screen NONE DETECTED NONE DETECTED   Phencyclidine (PCP) Ur S NONE DETECTED NONE DETECTED   Cannabinoid 50 Ng, Ur [place] NONE DETECTED NONE DETECTED   Barbiturates, Ur Screen NONE DETECTED NONE DETECTED   Benzodiazepine, Ur Scrn POSITIVE (A) NONE DETECTED   Methadone Scn, Ur NONE DETECTED NONE DETECTED    Comment: (NOTE) Tricyclics + metabolites, urine    Cutoff 1000 ng/mL Amphetamines + metabolites, urine  Cutoff 1000 ng/mL MDMA (Ecstasy), urine              Cutoff 500 ng/mL Cocaine Metabolite, urine          Cutoff 300 ng/mL Opiate + metabolites, urine        Cutoff 300 ng/mL Phencyclidine (PCP), urine         Cutoff 25 ng/mL Cannabinoid, urine                 Cutoff 50 ng/mL Barbiturates + metabolites, urine  Cutoff 200 ng/mL Benzodiazepine, urine              Cutoff 200 ng/mL Methadone, urine                   Cutoff 300 ng/mL The urine drug screen provides only a preliminary, unconfirmed analytical test result and should not be used for non-medical purposes. Clinical consideration and professional judgment should be applied to any positive drug screen result due to possible interfering substances. A more specific alternate chemical method must be used in order to obtain a confirmed analytical result. Gas chromatography / mass spectrometry (GC/MS) is the preferred confirmat ory method. Performed at [facility], [address] [place], [place] [address]   Hemoglobin A1c     Status: Abnormal   Collection Time: 09/09/17  6:40 AM  Result Value Ref Range   Hgb A1c MFr Bld 6.2 (H) 4.8 - 5.6 %    Comment: (NOTE) Pre diabetes:          5.7%-6.4% Diabetes:               >6.4% Glycemic control for   <7.0% adults with diabetes    Mean Plasma Glucose 131.24 mg/dL    Comment: Performed at [facility] [address] [place],  [address]  Lipid panel     Status: Abnormal   Collection Time: 09/09/17  6:40 AM  Result Value Ref Range   Cholesterol 211 (H) 0 - 200 mg/dL   Triglycerides 102 <150 mg/dL   HDL 38 (L) >40 mg/dL   Total CHOL/HDL Ratio 5.6 RATIO   VLDL 20 0 - 40 mg/dL   LDL Cholesterol 153 (H) 0 - 99 mg/dL    Comment:        Total Cholesterol/HDL:CHD Risk Coronary Heart Disease Risk Table  Men   Women  1/2 Average Risk   3.4   3.3  Average Risk       5.0   4.4  2 X Average Risk   9.6   7.1  3 X Average Risk  23.4   11.0        Use the calculated Patient Ratio above and the CHD Risk Table to determine the patient's CHD Risk.        ATP III CLASSIFICATION (LDL):  <100     mg/dL   Optimal  100-129  mg/dL   Near or Above                    Optimal  130-159  mg/dL   Borderline  160-189  mg/dL   High  >190     mg/dL   Very High Performed at [facility], [address] [place], [place] [address]   TSH     Status: None   Collection Time: 09/09/17  6:40 AM  Result Value Ref Range   TSH 1.426 0.350 - 4.500 uIU/mL    Comment: Performed by a 3rd Generation assay with a functional sensitivity of <=0.01 uIU/mL. Performed at [facility], [address] [place], [place] [address]   CBC with Differential/Platelet     Status: Abnormal   Collection Time: 09/15/17  1:41 PM  Result Value Ref Range   WBC 5.0 3.6 - 11.0 K/uL   RBC 4.47 3.80 - 5.20 MIL/uL   Hemoglobin 14.0 12.0 - 16.0 g/dL   HCT 42.0 35.0 - 47.0 %   MCV 94.0 80.0 - 100.0 fL   MCH 31.2 26.0 - 34.0 pg   MCHC 33.2 32.0 - 36.0 g/dL   RDW 14.8 (H) 11.5 - 14.5 %   Platelets 233 150 - 440 K/uL   Neutrophils Relative % 48 %   Neutro Abs 2.4 1.4 - 6.5 K/uL   Lymphocytes Relative 36 %   Lymphs Abs 1.8 1.0 - 3.6 K/uL   Monocytes  Relative 14 %   Monocytes Absolute 0.7 0.2 - 0.9 K/uL   Eosinophils Relative 1 %   Eosinophils Absolute 0.1 0 - 0.7 K/uL   Basophils Relative 1 %   Basophils Absolute 0.1 0 - 0.1 K/uL    Comment: Performed at [facility], [address] [place],  [address]  Comprehensive metabolic panel     Status: Abnormal   Collection Time: 09/15/17  1:41 PM  Result Value Ref Range   Sodium 139 135 - 145 mmol/L   Potassium 2.9 (L) 3.5 - 5.1 mmol/L   Chloride 101 101 - 111 mmol/L   CO2 24 22 - 32 mmol/L   Glucose, Bld 141 (H) 65 - 99 mg/dL   BUN 36 (H) 6 - 20 mg/dL   Creatinine, Ser 1.25 (H) 0.44 - 1.00 mg/dL   Calcium 9.7 8.9 - 10.3 mg/dL   Total Protein 7.7 6.5 - 8.1 g/dL   Albumin 4.3 3.5 - 5.0 g/dL   AST 33 15 - 41 U/L   ALT 24 14 - 54 U/L   Alkaline Phosphatase 67 38 - 126 U/L   Total Bilirubin 1.2 0.3 - 1.2 mg/dL   GFR calc non Af Amer 44 (L) >60 mL/min   GFR calc Af Amer 52 (L) >60 mL/min    Comment: (NOTE) The eGFR has been calculated using the CKD EPI equation. This calculation has not been validated in all clinical situations. eGFR's persistently <  60 mL/min signify possible Chronic Kidney Disease.    Anion gap 14 5 - 15    Comment: Performed at [facility], [address] [place], [place] [address]  Basic metabolic panel     Status: Abnormal   Collection Time: 09/16/17  6:43 AM  Result Value Ref Range   Sodium 142 135 - 145 mmol/L   Potassium 3.4 (L) 3.5 - 5.1 mmol/L   Chloride 103 101 - 111 mmol/L   CO2 28 22 - 32 mmol/L   Glucose, Bld 113 (H) 65 - 99 mg/dL   BUN 33 (H) 6 - 20 mg/dL   Creatinine, Ser 1.24 (H) 0.44 - 1.00 mg/dL   Calcium 9.8 8.9 - 10.3 mg/dL   GFR calc non Af Amer 45 (L) >60 mL/min   GFR calc Af Amer 52 (L) >60 mL/min    Comment: (NOTE) The eGFR has been calculated using the CKD EPI equation. This calculation has not been validated in all clinical situations. eGFR's persistently <60 mL/min signify possible Chronic  Kidney Disease.    Anion gap 11 5 - 15    Comment: Performed at [facility], [address] [place], [place] [address]  Magnesium     Status: None   Collection Time: 09/16/17  6:43 AM  Result Value Ref Range   Magnesium 2.3 1.7 - 2.4 mg/dL    Comment: Performed at [facility], [address] [place], [place] [address]  Glucose, capillary     Status: Abnormal   Collection Time: 09/16/17 11:41 AM  Result Value Ref Range   Glucose-Capillary 117 (H) 65 - 99 mg/dL   Comment 1 Notify RN   Glucose, capillary     Status: Abnormal   Collection Time: 09/16/17  4:47 PM  Result Value Ref Range   Glucose-Capillary 136 (H) 65 - 99 mg/dL   Comment 1 Notify RN   Glucose, capillary     Status: Abnormal   Collection Time: 09/17/17  7:09 AM  Result Value Ref Range   Glucose-Capillary 140 (H) 65 - 99 mg/dL  Basic metabolic panel     Status: Abnormal   Collection Time: 09/17/17  8:16 AM  Result Value Ref Range   Sodium 137 135 - 145 mmol/L   Potassium 2.8 (L) 3.5 - 5.1 mmol/L   Chloride 98 (L) 101 - 111 mmol/L   CO2 26 22 - 32 mmol/L   Glucose, Bld 145 (H) 65 - 99 mg/dL   BUN 33 (H) 6 - 20 mg/dL   Creatinine, Ser 1.31 (H) 0.44 - 1.00 mg/dL   Calcium 9.6 8.9 - 10.3 mg/dL   GFR calc non Af Amer 42 (L) >60 mL/min   GFR calc Af Amer 49 (L) >60 mL/min    Comment: (NOTE) The eGFR has been calculated using the CKD EPI equation. This calculation has not been validated in all clinical situations. eGFR's persistently <60 mL/min signify possible Chronic Kidney Disease.    Anion gap 13 5 - 15    Comment: Performed at [facility], [address] [place], [place] [address]  Glucose, capillary     Status: Abnormal   Collection Time: 09/17/17 11:43 AM  Result Value Ref Range   Glucose-Capillary 143 (H) 65 - 99 mg/dL  Glucose, capillary     Status: Abnormal   Collection Time: 09/17/17  4:31 PM  Result Value Ref Range   Glucose-Capillary 124 (H) 65 - 99 mg/dL   Glucose, capillary  Status: Abnormal   Collection Time: 09/17/17  8:27 PM  Result Value Ref Range   Glucose-Capillary 124 (H) 65 - 99 mg/dL  Urinalysis, Complete w Microscopic     Status: Abnormal   Collection Time: 09/17/17  9:20 PM  Result Value Ref Range   Color, Urine YELLOW (A) YELLOW   APPearance HAZY (A) CLEAR   Specific Gravity, Urine 1.021 1.005 - 1.030   pH 6.0 5.0 - 8.0   Glucose, UA NEGATIVE NEGATIVE mg/dL   Hgb urine dipstick MODERATE (A) NEGATIVE   Bilirubin Urine NEGATIVE NEGATIVE   Ketones, ur NEGATIVE NEGATIVE mg/dL   Protein, ur 30 (A) NEGATIVE mg/dL   Nitrite NEGATIVE NEGATIVE   Leukocytes, UA NEGATIVE NEGATIVE   RBC / HPF 0-5 0 - 5 RBC/hpf   WBC, UA 0-5 0 - 5 WBC/hpf   Bacteria, UA RARE (A) NONE SEEN   Squamous Epithelial / LPF 0-5 (A) NONE SEEN    Comment: Performed at [facility], [address] [place],  [address]  Glucose, capillary     Status: Abnormal   Collection Time: 09/18/17  7:02 AM  Result Value Ref Range   Glucose-Capillary 136 (H) 65 - 99 mg/dL   Comment 1 Notify RN   Glucose, capillary     Status: Abnormal   Collection Time: 09/18/17 11:22 AM  Result Value Ref Range   Glucose-Capillary 123 (H) 65 - 99 mg/dL   Comment 1 Notify RN   Glucose, capillary     Status: Abnormal   Collection Time: 09/18/17  4:28 PM  Result Value Ref Range   Glucose-Capillary 116 (H) 65 - 99 mg/dL   Comment 1 Notify RN   Glucose, capillary     Status: Abnormal   Collection Time: 09/18/17  8:18 PM  Result Value Ref Range   Glucose-Capillary 128 (H) 65 - 99 mg/dL   Comment 1 Notify RN   Glucose, capillary     Status: Abnormal   Collection Time: 09/19/17  7:12 AM  Result Value Ref Range   Glucose-Capillary 135 (H) 65 - 99 mg/dL   Comment 1 Notify RN   Glucose, capillary     Status: Abnormal   Collection Time: 09/19/17 11:30 AM  Result Value Ref Range   Glucose-Capillary 130 (H) 65 - 99 mg/dL  Comprehensive metabolic panel     Status:  Abnormal   Collection Time: 11/03/17  8:09 PM  Result Value Ref Range   Sodium 136 135 - 145 mmol/L   Potassium 2.7 (LL) 3.5 - 5.1 mmol/L    Comment: CRITICAL RESULT CALLED TO, READ BACK BY AND VERIFIED WITH SONYA WEAVER ON 11/03/17 AT 2053 [facility]    Chloride 101 101 - 111 mmol/L   CO2 23 22 - 32 mmol/L   Glucose, Bld 181 (H) 65 - 99 mg/dL   BUN 13 6 - 20 mg/dL   Creatinine, Ser 0.73 0.44 - 1.00 mg/dL   Calcium 9.5 8.9 - 10.3 mg/dL   Total Protein 8.0 6.5 - 8.1 g/dL   Albumin 4.5 3.5 - 5.0 g/dL   AST 53 (H) 15 - 41 U/L   ALT 56 (H) 14 - 54 U/L   Alkaline Phosphatase 60 38 - 126 U/L   Total Bilirubin 1.7 (H) 0.3 - 1.2 mg/dL   GFR calc non Af Amer >60 >60 mL/min   GFR calc Af Amer >60 >60 mL/min    Comment: (NOTE) The eGFR has been calculated using the CKD EPI equation. This calculation  has not been validated in all clinical situations. eGFR's persistently <60 mL/min signify possible Chronic Kidney Disease.    Anion gap 12 5 - 15    Comment: Performed at [facility], [address] [place], [place] [address]  Ethanol     Status: None   Collection Time: 11/03/17  8:09 PM  Result Value Ref Range   Alcohol, Ethyl (B) <10 <10 mg/dL    Comment:        LOWEST DETECTABLE LIMIT FOR SERUM ALCOHOL IS 10 mg/dL FOR MEDICAL PURPOSES ONLY Performed at [facility], [address] [place], [place] [address]   Salicylate level     Status: None   Collection Time: 11/03/17  8:09 PM  Result Value Ref Range   Salicylate Lvl <8.7 2.8 - 30.0 mg/dL    Comment: Performed at [facility], [address] [place], [place] [address]  Acetaminophen level     Status: Abnormal   Collection Time: 11/03/17  8:09 PM  Result Value Ref Range   Acetaminophen (Tylenol), Serum <10 (L) 10 - 30 ug/mL    Comment:        THERAPEUTIC CONCENTRATIONS VARY SIGNIFICANTLY. A RANGE OF 10-30 ug/mL MAY BE AN EFFECTIVE CONCENTRATION FOR MANY PATIENTS. HOWEVER, SOME ARE BEST TREATED AT  CONCENTRATIONS OUTSIDE THIS RANGE. ACETAMINOPHEN CONCENTRATIONS >150 ug/mL AT 4 HOURS AFTER INGESTION AND >50 ug/mL AT 12 HOURS AFTER INGESTION ARE OFTEN ASSOCIATED WITH TOXIC REACTIONS. Performed at [facility], [address] [place], [place] [address]   cbc     Status: Abnormal   Collection Time: 11/03/17  8:09 PM  Result Value Ref Range   WBC 3.8 3.6 - 11.0 K/uL   RBC 4.43 3.80 - 5.20 MIL/uL   Hemoglobin 14.0 12.0 - 16.0 g/dL   HCT 41.6 35.0 - 47.0 %   MCV 93.8 80.0 - 100.0 fL   MCH 31.7 26.0 - 34.0 pg   MCHC 33.8 32.0 - 36.0 g/dL   RDW 15.4 (H) 11.5 - 14.5 %   Platelets 184 150 - 440 K/uL    Comment: Performed at [facility], [address], [place], [place] [address]  Magnesium     Status: Abnormal   Collection Time: 11/03/17  8:09 PM  Result Value Ref Range   Magnesium 1.5 (L) 1.7 - 2.4 mg/dL    Comment: Performed at [facility], [address] [place], [place] [address]  Potassium     Status: Abnormal   Collection Time: 11/04/17  7:15 AM  Result Value Ref Range   Potassium 3.4 (L) 3.5 - 5.1 mmol/L    Comment: HEMOLYSIS AT THIS LEVEL MAY AFFECT RESULT Performed at [facility], [address] [place], [place] [address]   Glucose, capillary     Status: Abnormal   Collection Time: 11/04/17  4:45 PM  Result Value Ref Range   Glucose-Capillary 120 (H) 65 - 99 mg/dL   Comment 1 Notify RN   Hemoglobin A1c     Status: Abnormal   Collection Time: 11/05/17  6:52 AM  Result Value Ref Range   Hgb A1c MFr Bld 6.2 (H) 4.8 - 5.6 %    Comment: (NOTE) Pre diabetes:          5.7%-6.4% Diabetes:              >6.4% Glycemic control for   <7.0% adults with diabetes    Mean Plasma Glucose 131.24 mg/dL    Comment: Performed at  [facility], [place], [place], [place] [address]  Lipid panel     Status: Abnormal   Collection Time: 11/05/17  6:52 AM  Result Value Ref Range   Cholesterol 217 (H) 0 - 200 mg/dL    Triglycerides 102 <150 mg/dL   HDL 34 (L) >40 mg/dL   Total CHOL/HDL Ratio 6.4 RATIO   VLDL 20 0 - 40 mg/dL   LDL Cholesterol 163 (H) 0 - 99 mg/dL    Comment:        Total Cholesterol/HDL:CHD Risk Coronary Heart Disease Risk Table                     Men   Women  1/2 Average Risk   3.4   3.3  Average Risk       5.0   4.4  2 X Average Risk   9.6   7.1  3 X Average Risk  23.4   11.0        Use the calculated Patient Ratio above and the CHD Risk Table to determine the patient's CHD Risk.        ATP III CLASSIFICATION (LDL):  <100     mg/dL   Optimal  100-129  mg/dL   Near or Above                    Optimal  130-159  mg/dL   Borderline  160-189  mg/dL   High  >190     mg/dL   Very High Performed at [facility], [address] [place], [place] [address]   TSH     Status: None   Collection Time: 11/05/17  6:52 AM  Result Value Ref Range   TSH 0.942 0.350 - 4.500 uIU/mL    Comment: Performed by a 3rd Generation assay with a functional sensitivity of <=0.01 uIU/mL. Performed at [facility] [place], [address] [place], [place] [address]   Glucose, capillary     Status: Abnormal   Collection Time: 11/15/17  6:58 AM  Result Value Ref Range   Glucose-Capillary 105 (H) 65 - 99 mg/dL      PHQ2/9: Depression screen [facility] 2/9 11/25/2017 07/29/2017 07/21/2017 05/26/2017 02/18/2017  Decreased Interest 1 0 1 0 0  Down, Depressed, Hopeless 1 1 1  0 0  PHQ - 2 Score 2 1 2  0 0  Altered sleeping 2 1 0 - -  Tired, decreased energy 2 1 1  - -  Change in appetite 2 1 0 - -  Feeling bad or failure about yourself  1 1 0 - -  Trouble concentrating 1 0 1 - -  Moving slowly or fidgety/restless 1 0 0 - -  Suicidal thoughts 0 - - - -  PHQ-9 Score 11 5 4  - -  Difficult doing work/chores Somewhat difficult Somewhat difficult Not difficult at all - -     Fall Risk: Fall Risk  11/25/2017 07/29/2017 07/21/2017 07/21/2017 05/26/2017  Falls in the past year? No No No No No      Functional Status Survey: Is the patient deaf or have difficulty hearing?: Yes Does the patient have difficulty seeing, even when wearing glasses/contacts?: No Does the patient have difficulty concentrating, remembering, or making decisions?: Yes Does the patient have difficulty walking or climbing stairs?: Yes(due to left knee pain) Does the patient have difficulty dressing or bathing?: No Does the patient have difficulty doing errands alone such as visiting a doctor's office  or shopping?: No    Assessment & Plan  1. Meningioma [facility])  Keep follow up with neurosurgeon  2. Atherosclerosis of aorta (HCC)  On statin and aspirin   3. Calcification of coronary artery  No symptoms   4. Paranoid schizophrenia ([place])  Needs to follow up with psychiatrist, asked Cristy Folks to contact health department and arrange for social worker to get her a follow up, unsure of who is the psychiatrist she is supposed to see. She states no longer seeing Dr. Einar Grad  5. Hypokalemia  - Magnesium - Potassium  6. Hypomagnesemia  - Magnesium - Potassium  7. Controlled type 2 diabetes mellitus without complication, without long-term current use of insulin ([place])  Reviewed hgbA1C done at [facility]   8. Mixed hyperlipidemia  On statin therapy   9. Essential hypertension  bp is at goal today, left [facility] with instructions to stop Atenolol, however she has been taking medication and bp is at goal, continue lisinopril and Atenolol for now

## 2017-11-26 NOTE — Telephone Encounter (Signed)
Chrystal,   We need to get a hold of the social worker, that is why I sent this message to you.  We will need to call health department daily until we can talk to her. It is our responsibility to avoid patient to be re-admitted.  She needs to see psychiatrist. Thank you  Dr. Ancil Boozer

## 2017-11-26 NOTE — Telephone Encounter (Signed)
Please call Lauren [name] again at the [facility] so she can work on getting the patient an appointment with her psychiatrist. She missed her first appointment with them and we need their name and setting her up an appointment with psychiatrist along with a ride to their office.

## 2017-11-27 ENCOUNTER — Other Ambulatory Visit: Payer: Self-pay | Admitting: Family Medicine

## 2017-11-27 ENCOUNTER — Telehealth: Payer: Self-pay

## 2017-11-27 DIAGNOSIS — F200 Paranoid schizophrenia: Secondary | ICD-10-CM

## 2017-11-27 NOTE — Telephone Encounter (Signed)
Tina from [facility] called stated that pt does not have a current psych doctor. Please place referral.

## 2017-11-27 NOTE — Telephone Encounter (Signed)
I recommend her to be taken to [facility] or [facility] for a dose of Haldol, she has missed one week of medication already.  Please notify social worker to make sure she gets taken to where she needs to go.  Thank you

## 2017-11-27 NOTE — Telephone Encounter (Signed)
Can [facility] help?

## 2017-11-28 NOTE — Telephone Encounter (Signed)
I called Lauren [name] when patient was here on Tuesday and left a detailed vm. I also called her on Wednesday and left another voicemail. I was not here to call her on Thursday. I have not received a CRM or message in which she has returned any of my phone calls. I did call back again today and left another message. I will continue to call and leave a message with Mrs. Lauren [name] on a daily basis until I get a response.

## 2017-12-04 ENCOUNTER — Telehealth: Payer: Self-pay | Admitting: Family Medicine

## 2017-12-04 NOTE — Telephone Encounter (Signed)
Copied from [place]. Topic: Quick Communication - See Telephone Encounter >> Dec 04, 2017  3:21 PM Burnis Medin, NT wrote: CRM for notification. See Telephone encounter for: 12/04/17. Patient called and wanted to see if the nurse can give her a call back regarding her bladder and also patient said she had labs done and wanted to know if her sugar was up.

## 2017-12-05 ENCOUNTER — Telehealth: Payer: Self-pay | Admitting: Family Medicine

## 2017-12-05 NOTE — Telephone Encounter (Signed)
I also tried to call and speak with the patient in regards to her bladder concerns and her labs and was unable to reach her or leave a vm. I will send a CRM if she calls back.

## 2017-12-05 NOTE — Telephone Encounter (Signed)
Discuss labs done on 11/25/2017 and while patient was in seeing Dr. Ancil Boozer her bladder issues were never discussed. Patient is coming in to see. Dr. Sanda Klein for urinary frequency on 12/08/17.

## 2017-12-05 NOTE — Telephone Encounter (Signed)
Copied from [place] [phone]. Topic: Quick Communication - Office Called Patient >> Dec 05, 2017 10:04 AM Chilton Greathouse, CMA wrote: Reason for CRM: Returning patient's call: I tried to call and speak with the patient in regards to her bladder concerns and her labs and was unable to reach her or leave a vm. If she calls back please ask her what her concerns are about her bladder and give her the results regarding her glucose.    Pt is having frequent trips to the rest room, she is having accidents.  She says her urine feels sticky.  She says her bladder feels out of control.   cb [phone]

## 2017-12-05 NOTE — Telephone Encounter (Signed)
I tired to call to Lauren [name] again today. There was still no answer after trying to get through 4 times and I was not able to leave a message. Please advise.

## 2017-12-05 NOTE — Telephone Encounter (Signed)
Can you take a look at this? 

## 2017-12-05 NOTE — Telephone Encounter (Signed)
Please call pt back to discuss labs  [phone]

## 2017-12-08 ENCOUNTER — Ambulatory Visit: Payer: Self-pay | Admitting: Family Medicine

## 2017-12-09 ENCOUNTER — Ambulatory Visit (INDEPENDENT_AMBULATORY_CARE_PROVIDER_SITE_OTHER): Payer: Medicare Other | Admitting: Family Medicine

## 2017-12-09 ENCOUNTER — Encounter: Payer: Self-pay | Admitting: Family Medicine

## 2017-12-09 VITALS — BP 132/72 | HR 72 | Temp 97.6°F | Ht 66.0 in | Wt 244.2 lb

## 2017-12-09 DIAGNOSIS — M25561 Pain in right knee: Secondary | ICD-10-CM | POA: Diagnosis not present

## 2017-12-09 DIAGNOSIS — R350 Frequency of micturition: Secondary | ICD-10-CM

## 2017-12-09 DIAGNOSIS — E119 Type 2 diabetes mellitus without complications: Secondary | ICD-10-CM

## 2017-12-09 DIAGNOSIS — G8929 Other chronic pain: Secondary | ICD-10-CM

## 2017-12-09 DIAGNOSIS — M25562 Pain in left knee: Secondary | ICD-10-CM | POA: Diagnosis not present

## 2017-12-09 LAB — POCT URINALYSIS DIPSTICK
Appearance: NORMAL
Bilirubin, UA: NEGATIVE
Glucose, UA: NEGATIVE
Ketones, UA: NEGATIVE
Leukocytes, UA: NEGATIVE
Nitrite, UA: NEGATIVE
Odor: ABNORMAL
Spec Grav, UA: 1.02 (ref 1.010–1.025)
Urobilinogen, UA: 0.2 E.U./dL
pH, UA: 5 (ref 5.0–8.0)

## 2017-12-09 LAB — GLUCOSE, POCT (MANUAL RESULT ENTRY): POC Glucose: 114 mg/dl — AB (ref 70–99)

## 2017-12-09 NOTE — Progress Notes (Signed)
BP 132/72 (BP Location: Left Arm, Patient Position: Sitting, Cuff Size: Large)   Pulse 72   Temp 97.6 F (36.4 C) (Oral)   Ht 5\' 6"  (1.676 m)   Wt 244 lb 3.2 oz (110.8 kg)   SpO2 98%   BMI 39.41 kg/m    Subjective:    Patient ID: Lauren [name], female    DOB: [birth year], 67 y.o.   MRN: [id number]  HPI: Lauren [name] is a 67 y.o. female  Chief Complaint  Patient presents with  . Urinary Frequency  . Knee Pain    HPI Patient is seen today for an acute visit; she is usually seen Dr. Ancil Boozer She has urinary frequency; feeling chills; no fever; urinary frequency; strong odor; not getting frequent bladder infection  She also c/o knee pain; last year in May (2018), she had a total knee replacement on the LEFT; she has not been right since then; her ortho was Dr. Roland Rack; she has not been back to see him about this, other than going for two post-op checks; the left knee feels stiff and she has pain in it; hurts more with bending; cannot squat down; cannot go up and down stairs (not more than two steps, and not if they are deep steps), has to put one foot up at a time, using rail assistance with that; used ice on it at first; no locking; has osteoarthritis in both knees  Type 2 diabetes; on metformin BID; not checking sugars, but FSBS today was 114; no hx of protein in the urine to her knowledge  Depression screen [facility] 2/9 12/09/2017 11/25/2017 07/29/2017 07/21/2017 05/26/2017  Decreased Interest 0 1 0 1 0  Down, Depressed, Hopeless 0 1 1 1  0  PHQ - 2 Score 0 2 1 2  0  Altered sleeping - 2 1 0 -  Tired, decreased energy - 2 1 1  -  Change in appetite - 2 1 0 -  Feeling bad or failure about yourself  - 1 1 0 -  Trouble concentrating - 1 0 1 -  Moving slowly or fidgety/restless - 1 0 0 -  Suicidal thoughts - 0 - - -  PHQ-9 Score - 11 5 4  -  Difficult doing work/chores - Somewhat difficult Somewhat difficult Not difficult at all -    Relevant past medical, surgical,  family and social history reviewed Past Medical History:  Diagnosis Date  . Anxiety   . Apnea, sleep 02/02/2014  . Awareness of heartbeats 02/02/2014  . Breathlessness on exertion 02/02/2014  . Diabetes mellitus   . Encounter for pre-employment examination 06/29/2015  . Excessive sweating 07/05/2015  . Fibromyalgia   . GERD (gastroesophageal reflux disease)   . Gravida 1 10/26/2015   1.    . Heart murmur   . Herniated disc   . Hyperlipidemia   . Hypertension   . Itch of skin 10/26/2015  . Lack of bladder control   . Lung tumor   . Rheumatoid arthritis ([place])   . Schizoaffective disorder ([place])   . Screening for cervical cancer 07/29/2017  . Seizure [facility])    after brain surgery 2012. last seizure 2013!  [name] Sex counseling 10/26/2015  . Sleep apnea   . 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 Stiffness of both knees 06/22/2015   Past Surgical History:  Procedure Laterality Date  . BRAIN TUMOR EXCISION  2012   benign  . CARDIAC CATHETERIZATION  2008  . CESAREAN SECTION    .  CYST REMOVAL HAND    . LUNG LOBECTOMY  1977   benign tumor  . 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 Heart disease Brother   . Depression Mother   . Heart attack Mother   . Stroke Mother   . Alcohol abuse Father   . Stroke Father   . Diabetes Sister   . Diabetes Sister   . Stomach cancer Sister   . Kidney disease Sister   . COPD Brother   . Lung cancer Brother   . Diabetes Brother    Social History   Tobacco Use  . Smoking status: Never Smoker  . Smokeless tobacco: Never Used  Substance Use Topics  . Alcohol use: No    Alcohol/week: 0.0 oz  . Drug use: No    Interim medical history since last visit reviewed. Allergies and medications reviewed  Review of Systems Per HPI unless  specifically indicated above     Objective:    BP 132/72 (BP Location: Left Arm, Patient Position: Sitting, Cuff Size: Large)   Pulse 72   Temp 97.6 F (36.4 C) (Oral)   Ht 5\' 6"  (1.676 m)   Wt 244 lb 3.2 oz (110.8 kg)   SpO2 98%   BMI 39.41 kg/m   Wt Readings from Last 3 Encounters:  12/09/17 244 lb 3.2 oz (110.8 kg)  11/25/17 240 lb 8 oz (109.1 kg)  11/03/17 258 lb (117 kg)    Physical Exam  Constitutional: She appears well-developed and well-nourished.  Obese  HENT:  Mouth/Throat: Mucous membranes are normal.  Eyes: EOM are normal. No scleral icterus.  Cardiovascular: Normal rate and regular rhythm.  Pulmonary/Chest: Effort normal and breath sounds normal.  Abdominal: She exhibits no distension. There is no tenderness.  Musculoskeletal:       Right knee: She exhibits decreased range of motion.       Left knee: She exhibits decreased range of motion. She exhibits no effusion. Tenderness found. Medial joint line tenderness noted.  Psychiatric: She has a normal mood and affect. Her behavior is normal.   Diabetic Foot Form - Detailed   Diabetic Foot Exam - detailed Diabetic Foot exam was performed with the following findings:  Yes 12/09/2017  9:50 AM  Visual Foot Exam completed.:  Yes  Pulse Foot Exam completed.:  Yes  Right Dorsalis Pedis:  Present Left Dorsalis Pedis:  Present  Sensory Foot Exam Completed.:  Yes Semmes-Weinstein Monofilament Test R Site 1-Great Toe:  Pos L Site 1-Great Toe:  Pos        Results for orders placed or performed in visit on 12/09/17  POCT urinalysis dipstick  Result Value Ref Range   Color, UA dark yellow    Clarity, UA clear    Glucose, UA neg    Bilirubin, UA neg    Ketones, UA neg    Spec Grav, UA 1.020 1.010 - 1.025   Blood, UA trace    pH, UA 5.0 5.0 - 8.0   Protein, UA +    Urobilinogen, UA 0.2 0.2 or 1.0 E.U./dL   Nitrite, UA neg    Leukocytes, UA Negative Negative   Appearance normal    Odor abnormal   POCT glucose  (manual entry)  Result Value Ref Range   POC Glucose 114 (A) 70 - 99 mg/dl  Assessment & Plan:   Problem List Items Addressed This Visit      Endocrine   Diabetes mellitus type 2, controlled, without complications ([place])    Foot exam by MD      Relevant Orders   POCT glucose (manual entry) (Completed)   Microalbumin / creatinine urine ratio     Other   Morbid obesity (HCC)    BMI > 35 with type 2 diabetes, high cholesterol, acid reflux; see AVS; talk to primary about other strategies to lose weight       Other Visit Diagnoses    Frequent urination    -  Primary   protein in urine; checking microalb:Cr, culture pending; encoruaged more water intake; not on SGLT-2 inh   Relevant Orders   POCT urinalysis dipstick (Completed)   POCT glucose (manual entry) (Completed)   Urine Culture   Chronic pain of both knees       refer back to Dr. Roland Rack; patient continuing to have pain after LEFT TKA and having pain in RIGHT knee as well; weight loss recommended   Relevant Orders   Ambulatory referral to Orthopedic Surgery       Follow up plan: Return if symptoms worsen or fail to improve.  An after-visit summary was printed and given to the patient at [facility].  Please see the patient instructions which may contain other information and recommendations beyond what is mentioned above in the assessment and plan.  No orders of the defined types were placed in this encounter.   Orders Placed This Encounter  Procedures  . Urine Culture  . Microalbumin / creatinine urine ratio  . Ambulatory referral to Orthopedic Surgery  . POCT urinalysis dipstick  . POCT glucose (manual entry)

## 2017-12-09 NOTE — Patient Instructions (Addendum)
Check out the information at familydoctor.org entitled "Nutrition for Weight Loss: What You Need to Know about Fad Diets" Try to lose between 1-2 pounds per week by taking in fewer calories and burning off more calories You can succeed by limiting portions, limiting foods dense in calories and fat, becoming more active, and drinking 8 glasses of water a day (64 ounces) Don't skip meals, especially breakfast, as skipping meals may alter your metabolism Do not use over-the-counter weight loss pills or gimmicks that claim rapid weight loss A healthy BMI (or body mass index) is between 18.5 and 24.9 You can calculate your ideal BMI at the [place] website ClubMonetize.fr  We'll get a urine culture In the meantime, drink plenty of water to keep your urine pale yellow We'll get you to Dr. Roland Rack Talk to Dr. Ancil Boozer to see if other medicine or referral to nutritionist might help you lose more weight, because that will help your knees and diabetes and other health issues   Obesity, Adult Obesity is the condition of having too much total body fat. Being overweight or obese means that your weight is greater than what is considered healthy for your body size. Obesity is determined by a measurement called BMI. BMI is an estimate of body fat and is calculated from height and weight. For adults, a BMI of 30 or higher is considered obese. Obesity can eventually lead to other health concerns and major illnesses, including:  Stroke.  Coronary artery disease (CAD).  Type 2 diabetes.  Some types of cancer, including cancers of the colon, breast, uterus, and gallbladder.  Osteoarthritis.  High blood pressure (hypertension).  High cholesterol.  Sleep apnea.  Gallbladder stones.  Infertility problems.  What are the causes? The main cause of obesity is taking in (consuming) more calories than your body uses for energy. Other factors that contribute to this  condition may include:  Being born with genes that make you more likely to become obese.  Having a medical condition that causes obesity. These conditions include: ? Hypothyroidism. ? Polycystic ovarian syndrome (PCOS). ? Binge-eating disorder. ? Cushing syndrome.  Taking certain medicines, such as steroids, antidepressants, and seizure medicines.  Not being physically active (sedentary lifestyle).  Living where there are limited places to exercise safely or buy healthy foods.  Not getting enough sleep.  What increases the risk? The following factors may increase your risk of this condition:  Having a family history of obesity.  Being a woman of African-American descent.  Being a man of Hispanic descent.  What are the signs or symptoms? Having excessive body fat is the main symptom of this condition. How is this diagnosed? This condition may be diagnosed based on:  Your symptoms.  Your medical history.  A physical exam. Your health care provider may measure: ? Your BMI. If you are an adult with a BMI between 25 and less than 30, you are considered overweight. If you are an adult with a BMI of 30 or higher, you are considered obese. ? The distances around your hips and your waist (circumferences). These may be compared to each other to help diagnose your condition. ? Your skinfold thickness. Your health care provider may gently pinch a fold of your skin and measure it.  How is this treated? Treatment for this condition often includes changing your lifestyle. Treatment may include some or all of the following:  Dietary changes. Work with your health care provider and a dietitian to set a weight-loss goal that is healthy  and reasonable for you. Dietary changes may include eating: ? Smaller portions. A portion size is the amount of a particular food that is healthy for you to eat at one time. This varies from person to person. ? Low-calorie or low-fat options. ? More whole  grains, fruits, and vegetables.  Regular physical activity. This may include aerobic activity (cardio) and strength training.  Medicine to help you lose weight. Your health care provider may prescribe medicine if you are unable to lose 1 pound a week after 6 weeks of eating more healthily and doing more physical activity.  Surgery. Surgical options may include gastric banding and gastric bypass. Surgery may be done if: ? Other treatments have not helped to improve your condition. ? You have a BMI of 40 or higher. ? You have life-threatening health problems related to obesity.  Follow these instructions at home:  Eating and drinking   Follow recommendations from your health care provider about what you eat and drink. Your health care provider may advise you to: ? Limit fast foods, sweets, and processed snack foods. ? Choose low-fat options, such as low-fat milk instead of whole milk. ? Eat 5 or more servings of fruits or vegetables every day. ? Eat at home more often. This gives you more control over what you eat. ? Choose healthy foods when you eat out. ? Learn what a healthy portion size is. ? Keep low-fat snacks on hand. ? Avoid sugary drinks, such as soda, fruit juice, iced tea sweetened with sugar, and flavored milk. ? Eat a healthy breakfast.  Drink enough water to keep your urine clear or pale yellow.  Do not go without eating for long periods of time (do not fast) or follow a fad diet. Fasting and fad diets can be unhealthy and even dangerous. Physical Activity  Exercise regularly, as told by your health care provider. Ask your health care provider what types of exercise are safe for you and how often you should exercise.  Warm up and stretch before being active.  Cool down and stretch after being active.  Rest between periods of activity. Lifestyle  Limit the time that you spend in front of your TV, computer, or video game system.  Find ways to reward yourself that  do not involve food.  Limit alcohol intake to no more than 1 drink a day for nonpregnant women and 2 drinks a day for men. One drink equals 12 oz of beer, 5 oz of wine, or 1 oz of hard liquor. General instructions  Keep a weight loss journal to keep track of the food you eat and how much you exercise you get.  Take over-the-counter and prescription medicines only as told by your health care provider.  Take vitamins and supplements only as told by your health care provider.  Consider joining a support group. Your health care provider may be able to recommend a support group.  Keep all follow-up visits as told by your health care provider. This is important. Contact a health care provider if:  You are unable to meet your weight loss goal after 6 weeks of dietary and lifestyle changes. This information is not intended to replace advice given to you by your health care provider. Make sure you discuss any questions you have with your health care provider. Document Released: 10/03/2004 Document Revised: 01/29/2016 Document Reviewed: 06/14/2015 Elsevier Interactive Patient Education  2018 [employer].

## 2017-12-09 NOTE — Assessment & Plan Note (Signed)
BMI > 35 with type 2 diabetes, high cholesterol, acid reflux; see AVS; talk to primary about other strategies to lose weight

## 2017-12-09 NOTE — Assessment & Plan Note (Signed)
Foot exam by MD 

## 2017-12-10 LAB — MICROALBUMIN / CREATININE URINE RATIO
Creatinine, Urine: 169 mg/dL (ref 20–275)
Microalb Creat Ratio: 82 mcg/mg creat — ABNORMAL HIGH (ref <30)
Microalb, Ur: 13.9 mg/dL

## 2017-12-11 LAB — URINE CULTURE
MICRO NUMBER:: 90409795
Result:: NO GROWTH
SPECIMEN QUALITY:: ADEQUATE

## 2017-12-28 ENCOUNTER — Emergency Department
Admission: EM | Admit: 2017-12-28 | Discharge: 2017-12-29 | Disposition: A | Discharge status: Other Acute Inpatient Hospital | Payer: Medicare Other | Attending: Emergency Medicine | Admitting: Emergency Medicine

## 2017-12-28 ENCOUNTER — Other Ambulatory Visit: Payer: Self-pay

## 2017-12-28 ENCOUNTER — Encounter: Payer: Self-pay | Admitting: Emergency Medicine

## 2017-12-28 DIAGNOSIS — I10 Essential (primary) hypertension: Secondary | ICD-10-CM | POA: Diagnosis not present

## 2017-12-28 DIAGNOSIS — Z96652 Presence of left artificial knee joint: Secondary | ICD-10-CM | POA: Insufficient documentation

## 2017-12-28 DIAGNOSIS — E119 Type 2 diabetes mellitus without complications: Secondary | ICD-10-CM | POA: Insufficient documentation

## 2017-12-28 DIAGNOSIS — Z7984 Long term (current) use of oral hypoglycemic drugs: Secondary | ICD-10-CM | POA: Insufficient documentation

## 2017-12-28 DIAGNOSIS — F250 Schizoaffective disorder, bipolar type: Secondary | ICD-10-CM | POA: Diagnosis not present

## 2017-12-28 DIAGNOSIS — F329 Major depressive disorder, single episode, unspecified: Secondary | ICD-10-CM | POA: Diagnosis present

## 2017-12-28 DIAGNOSIS — E876 Hypokalemia: Secondary | ICD-10-CM | POA: Diagnosis not present

## 2017-12-28 DIAGNOSIS — F331 Major depressive disorder, recurrent, moderate: Secondary | ICD-10-CM | POA: Diagnosis not present

## 2017-12-28 DIAGNOSIS — Z7982 Long term (current) use of aspirin: Secondary | ICD-10-CM | POA: Insufficient documentation

## 2017-12-28 LAB — URINALYSIS, COMPLETE (UACMP) WITH MICROSCOPIC
Bilirubin Urine: NEGATIVE
Glucose, UA: NEGATIVE mg/dL
Hgb urine dipstick: NEGATIVE
Ketones, ur: NEGATIVE mg/dL
Leukocytes, UA: NEGATIVE
Nitrite: NEGATIVE
Protein, ur: 100 mg/dL — AB
Specific Gravity, Urine: 1.021 (ref 1.005–1.030)
pH: 7 (ref 5.0–8.0)

## 2017-12-28 LAB — COMPREHENSIVE METABOLIC PANEL
ALT: 16 U/L (ref 14–54)
AST: 28 U/L (ref 15–41)
Albumin: 4.4 g/dL (ref 3.5–5.0)
Alkaline Phosphatase: 57 U/L (ref 38–126)
Anion gap: 10 (ref 5–15)
BUN: 14 mg/dL (ref 6–20)
CO2: 29 mmol/L (ref 22–32)
Calcium: 9.4 mg/dL (ref 8.9–10.3)
Chloride: 100 mmol/L — ABNORMAL LOW (ref 101–111)
Creatinine, Ser: 0.99 mg/dL (ref 0.44–1.00)
GFR calc Af Amer: >60 mL/min (ref >60)
GFR calc non Af Amer: 59 mL/min — ABNORMAL LOW (ref >60)
Glucose, Bld: 122 mg/dL — ABNORMAL HIGH (ref 65–99)
Potassium: 2.8 mmol/L — ABNORMAL LOW (ref 3.5–5.1)
Sodium: 139 mmol/L (ref 135–145)
Total Bilirubin: 0.8 mg/dL (ref 0.3–1.2)
Total Protein: 7.7 g/dL (ref 6.5–8.1)

## 2017-12-28 LAB — URINE DRUG SCREEN, QUALITATIVE (ARMC ONLY)
Amphetamines, Ur Screen: NOT DETECTED
Barbiturates, Ur Screen: NOT DETECTED
Benzodiazepine, Ur Scrn: NOT DETECTED
Cannabinoid 50 Ng, Ur ~~LOC~~: NOT DETECTED
Cocaine Metabolite,Ur ~~LOC~~: NOT DETECTED
MDMA (Ecstasy)Ur Screen: NOT DETECTED
Methadone Scn, Ur: NOT DETECTED
Opiate, Ur Screen: NOT DETECTED
Phencyclidine (PCP) Ur S: NOT DETECTED
Tricyclic, Ur Screen: NOT DETECTED

## 2017-12-28 LAB — CBC
HCT: 40.9 % (ref 35.0–47.0)
Hemoglobin: 14 g/dL (ref 12.0–16.0)
MCH: 31.7 pg (ref 26.0–34.0)
MCHC: 34.2 g/dL (ref 32.0–36.0)
MCV: 92.8 fL (ref 80.0–100.0)
Platelets: 281 10*3/uL (ref 150–440)
RBC: 4.4 MIL/uL (ref 3.80–5.20)
RDW: 14.9 % — ABNORMAL HIGH (ref 11.5–14.5)
WBC: 7.1 10*3/uL (ref 3.6–11.0)

## 2017-12-28 LAB — ETHANOL: Alcohol, Ethyl (B): <10 mg/dL (ref <10)

## 2017-12-28 MED ORDER — POTASSIUM CHLORIDE CRYS ER 20 MEQ PO TBCR
40.0000 meq | EXTENDED_RELEASE_TABLET | Freq: Once | ORAL | Status: AC
  Administered 2017-12-29: 40 meq via ORAL
  Filled 2017-12-28: qty 2

## 2017-12-28 NOTE — ED Provider Notes (Signed)
[facility] Emergency Department Provider Note   ____________________________________________   None    (approximate)  I have reviewed the triage vital signs and the nursing notes.   HISTORY  Chief Complaint Depression    HPI Eleanna Theilen is a 67 y.o. female who presents to the ED from home with a chief complaint of depression.  Patient has a history of schizophrenia who states she ran out of her Haldol 2 to 3 weeks ago.  Since then he she has felt "stressed and shaky".  Feels depressed without active suicidal ideation.  Patient also has a history of diabetes, hypertension, hyperlipidemia.  Voices no medical complaints.  Specifically denies recent fever, chills, chest pain, shortness of breath, abdominal pain, nausea, vomiting, diarrhea.  Denies recent travel or trauma.   Past Medical History:  Diagnosis Date  . Anxiety   . Apnea, sleep 02/02/2014  . Awareness of heartbeats 02/02/2014  . Breathlessness on exertion 02/02/2014  . Diabetes mellitus   . Encounter for pre-employment examination 06/29/2015  . Excessive sweating 07/05/2015  . Fibromyalgia   . GERD (gastroesophageal reflux disease)   . Gravida 1 10/26/2015   1.    . Heart murmur   . Herniated disc   . Hyperlipidemia   . Hypertension   . Itch of skin 10/26/2015  . Lack of bladder control   . Lung tumor   . Rheumatoid arthritis ([place])   . Schizoaffective disorder ([place])   . Screening for cervical cancer 07/29/2017  . Seizure [facility])    after brain surgery 2012. last seizure 2013!  [name] Sex counseling 10/26/2015  . Sleep apnea   . 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 Stiffness of both knees 06/22/2015    Patient Active Problem List   Diagnosis Date Noted  . Morbid obesity ([place]) 12/09/2017  . Meningioma ([place]) 11/25/2017  . Atherosclerosis of aorta ([place]) 11/25/2017  . Calcification of coronary artery 11/25/2017  . Schizophrenia ([place]) 11/04/2017  .  Acid reflux 06/29/2015  . Diabetes mellitus type 2, controlled, without complications ([place]) 82/50/5397  . Cardiac murmur 06/29/2015  . Arthritis of knee, degenerative 06/28/2015  . Insomnia w/ sleep apnea 03/21/2015  . Anxiety disorder 03/21/2015  . Hypertension 03/21/2015  . HLD (hyperlipidemia) 03/21/2015  . Urge incontinence 03/21/2015    Past Surgical History:  Procedure Laterality Date  . BRAIN TUMOR EXCISION  2012   benign  . CARDIAC CATHETERIZATION  2008  . CESAREAN SECTION    . CYST REMOVAL HAND    . LUNG LOBECTOMY  1977   benign tumor  . TOTAL KNEE ARTHROPLASTY Left 01/28/2017   Procedure: TOTAL KNEE ARTHROPLASTY;  Surgeon: Corky Mull, MD;  Location: [facility] ORS;  Service: Orthopedics;  Laterality: Left;    Prior to Admission medications   Medication Sig Start Date End Date Taking? Authorizing Provider  aspirin EC 81 MG tablet Take 81 mg by mouth daily before breakfast.    Yes [provider]  benztropine (COGENTIN) 0.5 MG tablet Take 1 tablet (0.5 mg total) by mouth at bedtime. 11/17/17  Yes McNew, Tyson Babinski, MD  Bioflavonoid Products (VITAMIN C) CHEW Chew 1 tablet by mouth daily as needed (for immune system support).   Yes [provider]  Capsaicin (ZOSTRIX EX) Apply 1 application topically 4 (four) times daily as needed (for knee pain.).   Yes [provider]  haloperidol (HALDOL) 10 MG tablet Take 1 tablet (10 mg total) by mouth at  bedtime. 11/17/17  Yes McNew, Tyson Babinski, MD  haloperidol decanoate (HALDOL DECANOATE) 100 MG/ML injection Inject 0.5 mLs (50 mg total) into the muscle every 28 (twenty-eight) days. Next injection due on 12/02/17 12/02/17  Yes McNew, Tyson Babinski, MD  ibuprofen (ADVIL,MOTRIN) 200 MG tablet Take 600 mg by mouth every 8 (eight) hours as needed (for knee pain.).   Yes [provider]  lisinopril (PRINIVIL,ZESTRIL) 20 MG tablet Take 1 tablet (20 mg total) by mouth daily. 11/18/17  Yes McNew, Tyson Babinski, MD  [facility] LOTN  Apply 1 application topically 4 (four) times daily as needed (for knee pain.). Two [employer] Essential Lotion (Aloe Vera, Almond Fairplains, and 6 natural anti-inflammatory essential oils; [place] Chamomile, [place], [place], Cylinder, Peppermint and [place])   Yes [provider]  metFORMIN (GLUCOPHAGE) 500 MG tablet Take 1 tablet (500 mg total) by mouth 2 (two) times daily with a meal. 11/17/17  Yes McNew, Tyson Babinski, MD  omeprazole (PRILOSEC) 20 MG capsule TAKE 1 CAPSULE BY MOUTH  DAILY 07/08/17  Yes Keith Rake Asad A, MD  rosuvastatin (CRESTOR) 40 MG tablet Take 1 tablet (40 mg total) by mouth daily. 11/17/17  Yes McNew, Tyson Babinski, MD  traZODone (DESYREL) 100 MG tablet Take 100 mg by mouth at bedtime as needed for sleep.   Yes [provider]    Allergies Percocet [oxycodone-acetaminophen]; Tramadol hcl; and Vicodin [hydrocodone-acetaminophen]  Family History  Problem Relation Age of Onset  . Heart disease Brother   . Depression Mother   . Heart attack Mother   . Stroke Mother   . Alcohol abuse Father   . Stroke Father   . Diabetes Sister   . Diabetes Sister   . Stomach cancer Sister   . Kidney disease Sister   . COPD Brother   . Lung cancer Brother   . Diabetes Brother     Social History Social History   Tobacco Use  . Smoking status: Never Smoker  . Smokeless tobacco: Never Used  Substance Use Topics  . Alcohol use: No    Alcohol/week: 0.0 oz  . Drug use: No    Review of Systems  Constitutional: No fever/chills. Eyes: No visual changes. ENT: No sore throat. Cardiovascular: Denies chest pain. Respiratory: Denies shortness of breath. Gastrointestinal: No abdominal pain.  No nausea, no vomiting.  No diarrhea.  No constipation. Genitourinary: Negative for dysuria. Musculoskeletal: Negative for back pain. Skin: Negative for rash. Neurological: Negative for headaches, focal weakness or numbness. Psychiatric:Positive for depression without suicidal  thoughts.  ____________________________________________   PHYSICAL EXAM:  VITAL SIGNS: ED Triage Vitals  Enc Vitals Group     BP 12/28/17 2135 (!) 166/85     Pulse Rate 12/28/17 2135 69     Resp 12/28/17 2135 17     Temp 12/28/17 2135 98.6 F (37 C)     Temp Source 12/28/17 2135 Oral     SpO2 12/28/17 2135 99 %     Weight 12/28/17 2137 240 lb (108.9 kg)     Height 12/28/17 2137 5\' 4"  (1.626 m)     Head Circumference --      Peak Flow --      Pain Score 12/28/17 2137 0     Pain Loc --      Pain Edu? --      Excl. in [place]? --     Constitutional: Alert and oriented. Well appearing and in no acute distress. Eyes: Conjunctivae are normal. PERRL. EOMI. Head:  Atraumatic. Nose: No congestion/rhinnorhea. Mouth/Throat: Mucous membranes are moist.  Oropharynx non-erythematous. Neck: No stridor.  No carotid bruits. Cardiovascular: Normal rate, regular rhythm. Grossly normal heart sounds.  Good peripheral circulation. Respiratory: Normal respiratory effort.  No retractions. Lungs CTAB. Gastrointestinal: Soft and nontender. No distention. No abdominal bruits. No CVA tenderness. Musculoskeletal: No lower extremity tenderness nor edema.  No joint effusions. Neurologic:  Normal speech and language. No gross focal neurologic deficits are appreciated. No gait instability. Skin:  Skin is warm, dry and intact. No rash noted. Psychiatric: Mood and affect are flat. Speech and behavior are normal.  ____________________________________________   LABS (all labs ordered are listed, but only abnormal results are displayed)  Labs Reviewed  COMPREHENSIVE METABOLIC PANEL - Abnormal; Notable for the following components:      Result Value   Potassium 2.8 (*)    Chloride 100 (*)    Glucose, Bld 122 (*)    GFR calc non Af Amer 59 (*)    All other components within normal limits  CBC - Abnormal; Notable for the following components:   RDW 14.9 (*)    All other components within normal limits    URINALYSIS, COMPLETE (UACMP) WITH MICROSCOPIC - Abnormal; Notable for the following components:   Color, Urine YELLOW (*)    APPearance HAZY (*)    Protein, ur 100 (*)    Bacteria, UA RARE (*)    Squamous Epithelial / LPF 0-5 (*)    All other components within normal limits  ETHANOL  URINE DRUG SCREEN, QUALITATIVE ([facility] ONLY)   ____________________________________________  EKG  None ____________________________________________  RADIOLOGY  ED MD interpretation: None  Official radiology report(s): No results found.  ____________________________________________   PROCEDURES  Procedure(s) performed: None  Procedures  Critical Care performed: No  ____________________________________________   INITIAL IMPRESSION / ASSESSMENT AND PLAN / ED COURSE  As part of my medical decision making, I reviewed the following data within the [facility] notes reviewed and incorporated, Labs reviewed, Old chart reviewed, A consult was requested and obtained from this/these consultant(s) Psychiatry and Notes from prior ED visits   67 year old female with schizophrenia who presents with depression without active SI/HI/AH/VH.  Laboratory and urinalysis results remarkable for mild hypokalemia which we will replete in the emergency department.  Patient contracts for safety and will remain voluntary in the ED pending TTS and psychiatry evaluations.   Clinical Course as of Dec 29 644  Mon Dec 29, 2017  0645 No further events.  Patient awaiting psychiatric evaluation with Dr. Cora Collum.  Her medicines were not able to be reconciled overnight.  Hopefully daytime Pharm tech will be able to verify her medications so they may be ordered for her wall she is here in the emergency department.   [JS]    Clinical Course User Index [JS] Paulette Blanch, MD     ____________________________________________   FINAL CLINICAL IMPRESSION(S) / ED DIAGNOSES  Final diagnoses:   Moderate episode of recurrent major depressive disorder ([place])  Hypokalemia     ED Discharge Orders    None       Note:  This document was prepared using Dragon voice recognition software and may include unintentional dictation errors.    Paulette Blanch, MD 12/29/17 [phone]

## 2017-12-28 NOTE — ED Triage Notes (Addendum)
Pt brought to the ED tonight feeling shaky-ran out of her Haldol 2-3 weeks ago and has felt "stressed and shaky" since; says she's been depressed and she would like a 72 observation hold to "adjust my medicine"; pt calm and cooperative; denies suicidal/homicidal thoughts; talking in complete coherent sentences

## 2017-12-29 ENCOUNTER — Other Ambulatory Visit: Payer: Self-pay

## 2017-12-29 ENCOUNTER — Inpatient Hospital Stay
Admission: AD | Admit: 2017-12-29 | Discharge: 2018-01-13 | DRG: 885 | Disposition: A | Discharge status: Home / Self Care | Payer: Medicare Other | Source: Intra-hospital | Attending: Psychiatry | Admitting: Psychiatry

## 2017-12-29 ENCOUNTER — Encounter: Payer: Self-pay | Admitting: Emergency Medicine

## 2017-12-29 DIAGNOSIS — Z885 Allergy status to narcotic agent status: Secondary | ICD-10-CM

## 2017-12-29 DIAGNOSIS — Z9114 Patient's other noncompliance with medication regimen: Secondary | ICD-10-CM

## 2017-12-29 DIAGNOSIS — K219 Gastro-esophageal reflux disease without esophagitis: Secondary | ICD-10-CM | POA: Diagnosis present

## 2017-12-29 DIAGNOSIS — E785 Hyperlipidemia, unspecified: Secondary | ICD-10-CM | POA: Diagnosis present

## 2017-12-29 DIAGNOSIS — I10 Essential (primary) hypertension: Secondary | ICD-10-CM | POA: Diagnosis present

## 2017-12-29 DIAGNOSIS — Z96652 Presence of left artificial knee joint: Secondary | ICD-10-CM | POA: Diagnosis present

## 2017-12-29 DIAGNOSIS — F250 Schizoaffective disorder, bipolar type: Secondary | ICD-10-CM

## 2017-12-29 DIAGNOSIS — Z590 Homelessness: Secondary | ICD-10-CM

## 2017-12-29 DIAGNOSIS — F329 Major depressive disorder, single episode, unspecified: Secondary | ICD-10-CM | POA: Diagnosis present

## 2017-12-29 DIAGNOSIS — E876 Hypokalemia: Secondary | ICD-10-CM | POA: Diagnosis not present

## 2017-12-29 DIAGNOSIS — Z7984 Long term (current) use of oral hypoglycemic drugs: Secondary | ICD-10-CM | POA: Diagnosis not present

## 2017-12-29 DIAGNOSIS — G4730 Sleep apnea, unspecified: Secondary | ICD-10-CM | POA: Diagnosis present

## 2017-12-29 DIAGNOSIS — R4782 Fluency disorder in conditions classified elsewhere: Secondary | ICD-10-CM | POA: Diagnosis present

## 2017-12-29 DIAGNOSIS — R251 Tremor, unspecified: Secondary | ICD-10-CM | POA: Diagnosis present

## 2017-12-29 DIAGNOSIS — E1169 Type 2 diabetes mellitus with other specified complication: Secondary | ICD-10-CM

## 2017-12-29 DIAGNOSIS — Z86011 Personal history of benign neoplasm of the brain: Secondary | ICD-10-CM

## 2017-12-29 DIAGNOSIS — F419 Anxiety disorder, unspecified: Secondary | ICD-10-CM | POA: Diagnosis present

## 2017-12-29 DIAGNOSIS — F200 Paranoid schizophrenia: Secondary | ICD-10-CM

## 2017-12-29 DIAGNOSIS — E7800 Pure hypercholesterolemia, unspecified: Secondary | ICD-10-CM

## 2017-12-29 DIAGNOSIS — E119 Type 2 diabetes mellitus without complications: Secondary | ICD-10-CM | POA: Diagnosis present

## 2017-12-29 DIAGNOSIS — M797 Fibromyalgia: Secondary | ICD-10-CM | POA: Diagnosis present

## 2017-12-29 DIAGNOSIS — F259 Schizoaffective disorder, unspecified: Secondary | ICD-10-CM | POA: Diagnosis present

## 2017-12-29 DIAGNOSIS — Z818 Family history of other mental and behavioral disorders: Secondary | ICD-10-CM

## 2017-12-29 DIAGNOSIS — F601 Schizoid personality disorder: Secondary | ICD-10-CM | POA: Diagnosis present

## 2017-12-29 DIAGNOSIS — Z7982 Long term (current) use of aspirin: Secondary | ICD-10-CM | POA: Diagnosis not present

## 2017-12-29 DIAGNOSIS — M069 Rheumatoid arthritis, unspecified: Secondary | ICD-10-CM | POA: Diagnosis present

## 2017-12-29 DIAGNOSIS — F331 Major depressive disorder, recurrent, moderate: Secondary | ICD-10-CM | POA: Diagnosis not present

## 2017-12-29 LAB — GLUCOSE, CAPILLARY: Glucose-Capillary: 141 mg/dL — ABNORMAL HIGH (ref 65–99)

## 2017-12-29 MED ORDER — ROSUVASTATIN CALCIUM 20 MG PO TABS
40.0000 mg | ORAL_TABLET | Freq: Every day | ORAL | Status: DC
  Administered 2017-12-30 – 2018-01-12 (×14): 40 mg via ORAL
  Filled 2017-12-29 (×14): qty 2

## 2017-12-29 MED ORDER — LISINOPRIL 20 MG PO TABS
20.0000 mg | ORAL_TABLET | Freq: Every day | ORAL | Status: DC
  Administered 2017-12-29 – 2018-01-13 (×16): 20 mg via ORAL
  Filled 2017-12-29 (×16): qty 1

## 2017-12-29 MED ORDER — ALUM & MAG HYDROXIDE-SIMETH 200-200-20 MG/5ML PO SUSP: 30.0000 mL | ORAL | Status: DC | PRN

## 2017-12-29 MED ORDER — METFORMIN HCL 500 MG PO TABS
500.0000 mg | ORAL_TABLET | Freq: Two times a day (BID) | ORAL | Status: DC
  Administered 2017-12-30 – 2018-01-13 (×29): 500 mg via ORAL
  Filled 2017-12-29 (×29): qty 1

## 2017-12-29 MED ORDER — ASPIRIN EC 81 MG PO TBEC
81.0000 mg | DELAYED_RELEASE_TABLET | Freq: Every day | ORAL | Status: DC
  Administered 2017-12-29 – 2018-01-13 (×16): 81 mg via ORAL
  Filled 2017-12-29 (×16): qty 1

## 2017-12-29 MED ORDER — BENZTROPINE MESYLATE 1 MG PO TABS
0.5000 mg | ORAL_TABLET | Freq: Every day | ORAL | Status: DC
  Administered 2017-12-29 – 2018-01-01 (×4): 0.5 mg via ORAL
  Filled 2017-12-29 (×4): qty 1

## 2017-12-29 MED ORDER — TRAZODONE HCL 100 MG PO TABS
100.0000 mg | ORAL_TABLET | Freq: Every day | ORAL | Status: DC
  Administered 2017-12-29: 100 mg via ORAL
  Filled 2017-12-29: qty 1

## 2017-12-29 MED ORDER — HALOPERIDOL 5 MG PO TABS
10.0000 mg | ORAL_TABLET | Freq: Every day | ORAL | Status: DC
  Administered 2017-12-29 – 2018-01-04 (×7): 10 mg via ORAL
  Filled 2017-12-29 (×7): qty 2

## 2017-12-29 MED ORDER — ACETAMINOPHEN 325 MG PO TABS
650.0000 mg | ORAL_TABLET | Freq: Four times a day (QID) | ORAL | Status: DC | PRN
  Administered 2017-12-31 – 2018-01-11 (×8): 650 mg via ORAL
  Filled 2017-12-29 (×8): qty 2

## 2017-12-29 MED ORDER — MAGNESIUM HYDROXIDE 400 MG/5ML PO SUSP: 30.0000 mL | Freq: Every day | ORAL | Status: DC | PRN

## 2017-12-29 MED ORDER — PANTOPRAZOLE SODIUM 40 MG PO TBEC
40.0000 mg | DELAYED_RELEASE_TABLET | Freq: Every day | ORAL | Status: DC
  Administered 2017-12-29 – 2018-01-13 (×16): 40 mg via ORAL
  Filled 2017-12-29 (×16): qty 1

## 2017-12-29 MED ORDER — HALOPERIDOL DECANOATE 100 MG/ML IM SOLN
50.0000 mg | Freq: Once | INTRAMUSCULAR | Status: AC
  Administered 2017-12-29: 50 mg via INTRAMUSCULAR
  Filled 2017-12-29 (×2): qty 0.5

## 2017-12-29 NOTE — Progress Notes (Signed)
Admission Note:  67 year old woman who is voluntary. Patient states she ran out of her Haldol and was unable to get medication and that she also lost her housing. She is not able to explain how and appears unsure of how she lost her housing. Patient states she has had some increase depression but denies suicidal thoughts. Pt appears flat and depressed. Pt was calm and cooperative with admission process.  Pt denies AVH .  Skin was assessed and found to be clear of any abnormal marks. PT searched and no contraband found, POC and unit policies explained and understanding verbalized. Consents obtained. Food and fluids offered, and fluids accepted. Pt had no additional questions or concerns.

## 2017-12-29 NOTE — ED Notes (Signed)
Patient with discharge, readmit orders to go to BMU, Patient transferred via w/c and escorted with nurse Tech. Patient is calm and cooperative, wants to get back on medication and feel better.

## 2017-12-29 NOTE — ED Notes (Signed)
Patient is polite, calm and cooperative, ask to use the phone, nurse gave her the phone, she has some scattered thoughts, thinks a few seconds before able to answer questions, she denies Si/hi or avh, but states that she is depressed, and her son knows she is here, nurse talked to her about her plan of care here and admission to BMU. q 15 minute checks and camera surveillance in progress for safety.

## 2017-12-29 NOTE — BH Assessment (Signed)
Patient is to be admitted to [facility] by Dr. Weber Cooks.  Attending Physician will be Dr. Wonda Olds.   Patient has been assigned to room 319-A, by [place].   Intake Paper Work has been signed and placed on patient chart.  ER staff is aware of the admission:  [name], ER Dian Situ   Dr. Caffie Pinto, ER MD   Amy L., Patient's Nurse   Genella Rife, Patient Access.

## 2017-12-29 NOTE — Progress Notes (Signed)
Chaplain responded to the on call page for [place]. Chaplain took a [employer] bible and prayed with the Pt. Pt asked for follow up   12/29/17 1400  Clinical Encounter Type  Visited With Patient  Visit Type Initial;Spiritual support  Referral From Nurse  Spiritual Encounters  Spiritual Needs Literature;Prayer

## 2017-12-29 NOTE — ED Notes (Signed)
Pt given paper and crayon, and comb to use.
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
## 2017-12-29 NOTE — Consult Note (Signed)
[place] Psychiatry Consult   Reason for Consult: Consult for 67 year old woman with a history of schizoaffective disorder Referring Physician: Rip Harbour Patient Identification: Lauren [name] MRN:  [id number] Principal Diagnosis: Schizoaffective disorder [facility]) Diagnosis:   Patient Active Problem List   Diagnosis Date Noted  . Schizoaffective disorder ([place]) [F25.9] 12/29/2017  . Morbid obesity ([place]) [E66.01] 12/09/2017  . Meningioma ([place]) [D32.9] 11/25/2017  . Atherosclerosis of aorta ([place]) [I70.0] 11/25/2017  . Calcification of coronary artery [I25.10, I25.84] 11/25/2017  . Schizophrenia ([place]) [F20.9] 11/04/2017  . Acid reflux [K21.9] 06/29/2015  . Diabetes mellitus type 2, controlled, without complications ([place]) [N82.9] 06/29/2015  . Cardiac murmur [R01.1] 06/29/2015  . Arthritis of knee, degenerative [M17.10] 06/28/2015  . Insomnia w/ sleep apnea [G47.00, G47.30] 03/21/2015  . Anxiety disorder [F41.9] 03/21/2015  . Hypertension [I10] 03/21/2015  . HLD (hyperlipidemia) [E78.5] 03/21/2015  . Urge incontinence [N39.41] 03/21/2015    Total Time spent with patient: 1 hour  Subjective:   Lauren [name] is a 67 y.o. female patient admitted with "I was depressed".  HPI: Patient interviewed chart reviewed.  67 year old woman with a history of schizoaffective disorder was discharged from the hospital last month.  She ran out of her Haldol several weeks ago.  Apparently there has been a [facility] surgeon of Haldol and so she was not able to get it filled.  She has had trouble keeping up with [facility] and has not kept her appointments.  In the meantime she claims she has gotten evicted from her apartment on the 13th.  She cannot even explain why this happened but she then tried to stay with a woman she had met at the shelter and says that person cheated her out of some money.  Patient has been feeling irritable anxious and depressed and also says that she is having  confusing "thought patterns" and possible hallucinations.  Social history: Apparently now homeless.  Patient does have family around but has alienated herself a fair bit from her family of origin.  Medical history: Mild diabetes and high blood pressure and gastric reflux  Substance abuse history: None  Past Psychiatric History: Patient has a long history of mental health problems with a diagnosis of schizoaffective disorder.  Last time she was in the hospital she came in paranoid angry and irritable and required couple weeks to finally get better.  Does not have a history of suicide attempts does have a history of some hostility and aggravation when she is psychotic.  Risk to Self:   Risk to Others:   Prior Inpatient Therapy:   Prior Outpatient Therapy:    Past Medical History:  Past Medical History:  Diagnosis Date  . Anxiety   . Apnea, sleep 02/02/2014  . Awareness of heartbeats 02/02/2014  . Breathlessness on exertion 02/02/2014  . Diabetes mellitus   . Encounter for pre-employment examination 06/29/2015  . Excessive sweating 07/05/2015  . Fibromyalgia   . GERD (gastroesophageal reflux disease)   . Gravida 1 10/26/2015   1.    . Heart murmur   . Herniated disc   . Hyperlipidemia   . Hypertension   . Itch of skin 10/26/2015  . Lack of bladder control   . Lung tumor   . Rheumatoid arthritis ([place])   . Schizoaffective disorder ([place])   . Screening for cervical cancer 07/29/2017  . Seizure [facility])    after brain surgery 2012. last seizure 2013!  [name] Sex counseling 10/26/2015  . Sleep apnea   . Status post  total knee replacement using cement, left 01/28/2017  . Stiffness of both knees 06/22/2015    Past Surgical History:  Procedure Laterality Date  . BRAIN TUMOR EXCISION  2012   benign  . CARDIAC CATHETERIZATION  2008  . CESAREAN SECTION    . CYST REMOVAL HAND    . LUNG LOBECTOMY  1977   benign tumor  . TOTAL KNEE ARTHROPLASTY Left 01/28/2017   Procedure: TOTAL KNEE  ARTHROPLASTY;  Surgeon: Corky Mull, MD;  Location: [facility] ORS;  Service: Orthopedics;  Laterality: Left;   Family History:  Family History  Problem Relation Age of Onset  . Heart disease Brother   . Depression Mother   . Heart attack Mother   . Stroke Mother   . Alcohol abuse Father   . Stroke Father   . Diabetes Sister   . Diabetes Sister   . Stomach cancer Sister   . Kidney disease Sister   . COPD Brother   . Lung cancer Brother   . Diabetes Brother    Family Psychiatric  History: None Social History:  Social History   Substance and Sexual Activity  Alcohol Use No  . Alcohol/week: 0.0 oz     Social History   Substance and Sexual Activity  Drug Use No    Social History   Socioeconomic History  . Marital status: Single    Spouse name: Not on file  . Number of children: Not on file  . Years of education: Not on file  . Highest education level: Not on file  Occupational History  . Not on file  Social Needs  . Financial resource strain: Not hard at all  . Food insecurity:    Worry: Never true    Inability: Never true  . Transportation needs:    Medical: No    Non-medical: No  Tobacco Use  . Smoking status: Never Smoker  . Smokeless tobacco: Never Used  Substance and Sexual Activity  . Alcohol use: No    Alcohol/week: 0.0 oz  . Drug use: No  . Sexual activity: Not Currently  Lifestyle  . Physical activity:    Days per week: 0 days    Minutes per session: 0 min  . Stress: Only a little  Relationships  . Social connections:    Talks on phone: Not on file    Gets together: Not on file    Attends religious service: More than 4 times per year    Active member of club or organization: No    Attends meetings of clubs or organizations: Never    Relationship status: Married  Other Topics Concern  . Not on file  Social History Narrative  . Not on file   Additional Social History:    Allergies:   Allergies  Allergen Reactions  . Percocet  [Oxycodone-Acetaminophen] Diarrhea, Nausea And Vomiting and Nausea Only  . Tramadol Hcl Diarrhea, Nausea And Vomiting and Nausea Only  . Vicodin [Hydrocodone-Acetaminophen] Diarrhea, Nausea And Vomiting and Nausea Only    Labs:  Results for orders placed or performed during the hospital encounter of 12/28/17 (from the past 48 hour(s))  Comprehensive metabolic panel     Status: Abnormal   Collection Time: 12/28/17  9:44 PM  Result Value Ref Range   Sodium 139 135 - 145 mmol/L   Potassium 2.8 (L) 3.5 - 5.1 mmol/L   Chloride 100 (L) 101 - 111 mmol/L   CO2 29 22 - 32 mmol/L   Glucose, Bld 122 (H) 65 -  99 mg/dL   BUN 14 6 - 20 mg/dL   Creatinine, Ser 0.99 0.44 - 1.00 mg/dL   Calcium 9.4 8.9 - 10.3 mg/dL   Total Protein 7.7 6.5 - 8.1 g/dL   Albumin 4.4 3.5 - 5.0 g/dL   AST 28 15 - 41 U/L   ALT 16 14 - 54 U/L   Alkaline Phosphatase 57 38 - 126 U/L   Total Bilirubin 0.8 0.3 - 1.2 mg/dL   GFR calc non Af Amer 59 (L) >60 mL/min   GFR calc Af Amer >60 >60 mL/min    Comment: (NOTE) The eGFR has been calculated using the CKD EPI equation. This calculation has not been validated in all clinical situations. eGFR's persistently <60 mL/min signify possible Chronic Kidney Disease.    Anion gap 10 5 - 15    Comment: Performed at [facility], [address] [place], [place] [address]  Ethanol     Status: None   Collection Time: 12/28/17  9:44 PM  Result Value Ref Range   Alcohol, Ethyl (B) <10 <10 mg/dL    Comment:        LOWEST DETECTABLE LIMIT FOR SERUM ALCOHOL IS 10 mg/dL FOR MEDICAL PURPOSES ONLY Performed at [facility], [address] [place], [place] [address]   cbc     Status: Abnormal   Collection Time: 12/28/17  9:44 PM  Result Value Ref Range   WBC 7.1 3.6 - 11.0 K/uL   RBC 4.40 3.80 - 5.20 MIL/uL   Hemoglobin 14.0 12.0 - 16.0 g/dL   HCT 40.9 35.0 - 47.0 %   MCV 92.8 80.0 - 100.0 fL   MCH 31.7 26.0 - 34.0 pg   MCHC 34.2 32.0 - 36.0 g/dL   RDW 14.9  (H) 11.5 - 14.5 %   Platelets 281 150 - 440 K/uL    Comment: Performed at [facility], [address], [place], [place] [address]  Urine Drug Screen, Qualitative     Status: None   Collection Time: 12/28/17  9:44 PM  Result Value Ref Range   Tricyclic, Ur Screen NONE DETECTED NONE DETECTED   Amphetamines, Ur Screen NONE DETECTED NONE DETECTED   MDMA (Ecstasy)Ur Screen NONE DETECTED NONE DETECTED   Cocaine Metabolite,Ur [place] NONE DETECTED NONE DETECTED   Opiate, Ur Screen NONE DETECTED NONE DETECTED   Phencyclidine (PCP) Ur S NONE DETECTED NONE DETECTED   Cannabinoid 50 Ng, Ur [place] NONE DETECTED NONE DETECTED   Barbiturates, Ur Screen NONE DETECTED NONE DETECTED   Benzodiazepine, Ur Scrn NONE DETECTED NONE DETECTED   Methadone Scn, Ur NONE DETECTED NONE DETECTED    Comment: (NOTE) Tricyclics + metabolites, urine    Cutoff 1000 ng/mL Amphetamines + metabolites, urine  Cutoff 1000 ng/mL MDMA (Ecstasy), urine              Cutoff 500 ng/mL Cocaine Metabolite, urine          Cutoff 300 ng/mL Opiate + metabolites, urine        Cutoff 300 ng/mL Phencyclidine (PCP), urine         Cutoff 25 ng/mL Cannabinoid, urine                 Cutoff 50 ng/mL Barbiturates + metabolites, urine  Cutoff 200 ng/mL Benzodiazepine, urine              Cutoff 200 ng/mL Methadone, urine  Cutoff 300 ng/mL The urine drug screen provides only a preliminary, unconfirmed analytical test result and should not be used for non-medical purposes. Clinical consideration and professional judgment should be applied to any positive drug screen result due to possible interfering substances. A more specific alternate chemical method must be used in order to obtain a confirmed analytical result. Gas chromatography / mass spectrometry (GC/MS) is the preferred confirmat ory method. Performed at [facility], [address] [place], [place] [address]   Urinalysis, Complete w Microscopic      Status: Abnormal   Collection Time: 12/28/17  9:44 PM  Result Value Ref Range   Color, Urine YELLOW (A) YELLOW   APPearance HAZY (A) CLEAR   Specific Gravity, Urine 1.021 1.005 - 1.030   pH 7.0 5.0 - 8.0   Glucose, UA NEGATIVE NEGATIVE mg/dL   Hgb urine dipstick NEGATIVE NEGATIVE   Bilirubin Urine NEGATIVE NEGATIVE   Ketones, ur NEGATIVE NEGATIVE mg/dL   Protein, ur 100 (A) NEGATIVE mg/dL   Nitrite NEGATIVE NEGATIVE   Leukocytes, UA NEGATIVE NEGATIVE   RBC / HPF 0-5 0 - 5 RBC/hpf   WBC, UA 0-5 0 - 5 WBC/hpf   Bacteria, UA RARE (A) NONE SEEN   Squamous Epithelial / LPF 0-5 (A) NONE SEEN   Mucus PRESENT     Comment: Performed at [facility], [address], [place], [place] [address]    Current Facility-Administered Medications  Medication Dose Route Frequency Provider Last Rate Last Dose  . acetaminophen (TYLENOL) tablet 650 mg  650 mg Oral Q6H PRN Aleeah Greeno T, MD      . alum & mag hydroxide-simeth (MAALOX/MYLANTA) 200-200-20 MG/5ML suspension 30 mL  30 mL Oral Q4H PRN Copelyn Widmer, [name], MD      . aspirin EC tablet 81 mg  81 mg Oral Daily Kelin Nixon T, MD      . benztropine (COGENTIN) tablet 0.5 mg  0.5 mg Oral QHS Ayvin Lipinski T, MD      . haloperidol (HALDOL) tablet 10 mg  10 mg Oral QHS Valoria Tamburri T, MD      . haloperidol decanoate (HALDOL DECANOATE) 100 MG/ML injection 50 mg  50 mg Intramuscular Once Zarek Relph T, MD      . lisinopril (PRINIVIL,ZESTRIL) tablet 20 mg  20 mg Oral Daily Delaila Nand T, MD      . magnesium hydroxide (MILK OF MAGNESIA) suspension 30 mL  30 mL Oral Daily PRN Blessin Kanno, [name], MD      . Derrill Memo ON 12/30/2017] metFORMIN (GLUCOPHAGE) tablet 500 mg  500 mg Oral BID WC Oseias Horsey T, MD      . pantoprazole (PROTONIX) EC tablet 40 mg  40 mg Oral Daily Laporche Martelle T, MD      . Derrill Memo ON 12/30/2017] rosuvastatin (CRESTOR) tablet 40 mg  40 mg Oral q1800 Tatijana Bierly T, MD      . traZODone (DESYREL) tablet 100 mg  100 mg Oral QHS  Sinead Hockman, [name], MD        Musculoskeletal: Strength & Muscle Tone: within normal limits Gait & Station: normal Patient leans: N/A  Psychiatric Specialty Exam: Physical Exam  Nursing note and vitals reviewed. Constitutional: She appears well-developed and well-nourished.  HENT:  Head: Normocephalic and atraumatic.  Eyes: Pupils are equal, round, and reactive to light. Conjunctivae are normal.  Neck: Normal range of motion.  Cardiovascular: Regular rhythm and normal heart sounds.  Respiratory: Effort normal. No respiratory distress.  GI: Soft.  Musculoskeletal: Normal range of motion.  Neurological: She is alert.  Skin: Skin is warm and dry.  Psychiatric: Judgment normal. Her affect is blunt. Her speech is delayed. She is slowed. Thought content is paranoid. Cognition and memory are impaired. She expresses no homicidal and no suicidal ideation. She exhibits abnormal recent memory.    Review of Systems  Constitutional: Negative.   HENT: Negative.   Eyes: Negative.   Respiratory: Negative.   Cardiovascular: Negative.   Gastrointestinal: Negative.   Musculoskeletal: Negative.   Skin: Negative.   Neurological: Negative.   Psychiatric/Behavioral: Positive for depression and hallucinations. Negative for memory loss, substance abuse and suicidal ideas. The patient is nervous/anxious and has insomnia.     There were no vitals taken for this visit.There is no height or weight on file to calculate BMI.  General Appearance: Casual  Eye Contact:  Good  Speech:  Slow  Volume:  Decreased  Mood:  Dysphoric  Affect:  Constricted  Thought Process:  Disorganized  Orientation:  Full (Time, Place, and Person)  Thought Content:  Paranoid Ideation and Rumination  Suicidal Thoughts:  No  Homicidal Thoughts:  No  Memory:  Immediate;   Fair Recent;   Fair Remote;   Fair  Judgement:  Fair  Insight:  Fair  Psychomotor Activity:  Decreased  Concentration:  Concentration: Fair  Recall:  [place] of Knowledge:  Good  Language:  Good  Akathisia:  No  Handed:  Right  AIMS (if indicated):     Assets:  Desire for Improvement Resilience  ADL's:  Intact  Cognition:  Impaired,  Mild  Sleep:        Treatment Plan Summary: Daily contact with patient to assess and evaluate symptoms and progress in treatment, Medication management and Plan Patient with a history of chronic psychotic disorder has been off her medicine for a few weeks.  Has decompensated with paranoia disorganized thinking probably hallucinations and confusion.  No place to live.  Poor self-care.  Patient will be admitted back to the psychiatric ward.  She is agreeable to the plan.  We will try to get her her Haldol Decanoate shot and restart oral Haldol as well as her usual medicines for her other medical problems.  Full set of labs.  15-minute checks in place.  Disposition: Recommend psychiatric Inpatient admission when medically cleared. Supportive therapy provided about ongoing stressors.  Alethia Berthold, MD 12/29/2017 6:24 PM

## 2017-12-29 NOTE — ED Notes (Signed)
Pt requested wipes, wash cloth and towels, tooth brush, tooth paste, new scrubs this tech provided all items and pt gave herself bed bath and changed into new scrubs; pt also given 3 new warm blankets

## 2017-12-29 NOTE — Plan of Care (Addendum)
Patient found in room reading the Bible upon my arrival. Patient is neither visible nor social this evening. Complains that Haldol "is not workingWater engineer as to why MD would continue to prescribe it. Encouraged patient to speak with MD in am. Patient agreed to take Haldol Decanoate. Given in left ventrolateral area. Tolerated well. Denies SI/HI. Complains of voices but did not elaborate. Compliant with HS medications and staff direction. Q 15 minute checks maintained. Will continue to monitor throughout the shift. Patient slept 6.75 hours. No apparent distress. Patient BP elevated upon waking. Recheck WNL. Remains on Lisinopril for HTN. Will endorse care to oncoming shift. CBG 114. Compliant with blood draw.  Problem: Clinical Measurements: Goal: Ability to maintain clinical measurements within normal limits will improve Outcome: Progressing Goal: Will remain free from infection Outcome: Progressing   Problem: Nutrition: Goal: Adequate nutrition will be maintained Outcome: Progressing   Problem: Elimination: Goal: Will not experience complications related to bowel motility Outcome: Progressing Goal: Will not experience complications related to urinary retention Outcome: Progressing   Problem: Coping: Goal: Will verbalize feelings Outcome: Progressing

## 2017-12-29 NOTE — ED Notes (Signed)
Nurse gave report to [facility] In BMU.

## 2017-12-29 NOTE — ED Notes (Signed)
VOL  PENDING  PLACEMENT 

## 2017-12-29 NOTE — ED Notes (Signed)
Dr.[name] at bedside  

## 2017-12-29 NOTE — Tx Team (Signed)
Initial Treatment Plan 12/29/2017 6:51 PM Lauren [name] HKU:[id number]    PATIENT STRESSORS: Financial difficulties Loss of suppoottt system   PATIENT STRENGTHS: Ability for insight Capable of independent living General fund of knowledge   PATIENT IDENTIFIED PROBLEMS: Depression  Insomnia  Loss of support system  Lack of coping skills               DISCHARGE CRITERIA:  Adequate post-discharge living arrangements Improved stabilization in mood, thinking, and/or behavior Motivation to continue treatment in a less acute level of care  PRELIMINARY DISCHARGE PLAN: Placement in alternative living arrangements  PATIENT/FAMILY INVOLVEMENT: This treatment plan has been presented to and reviewed with the patient, Lauren [name].  The patient and family have been given the opportunity to ask questions and make suggestions.  Jolene Provost, RN 12/29/2017, 6:51 PM

## 2017-12-29 NOTE — ED Notes (Signed)
Report to heather, rn

## 2017-12-29 NOTE — ED Notes (Signed)
Report from [place], rn. Pt moved from hallway bed to room 24. Lights dimmed for comfort. Po fluids provided. Pt ambulatory without difficulty.

## 2017-12-29 NOTE — ED Notes (Signed)
Pt sleeping. 

## 2017-12-29 NOTE — ED Notes (Signed)
Report given to Abigail Butts, Therapist, sports.  Pt to move to [place].

## 2017-12-30 LAB — HEMOGLOBIN A1C
Hgb A1c MFr Bld: 6.4 % — ABNORMAL HIGH (ref 4.8–5.6)
Mean Plasma Glucose: 136.98 mg/dL

## 2017-12-30 LAB — LIPID PANEL
Cholesterol: 137 mg/dL (ref 0–200)
HDL: 49 mg/dL (ref >40)
LDL Cholesterol: 73 mg/dL (ref 0–99)
Total CHOL/HDL Ratio: 2.8 RATIO
Triglycerides: 75 mg/dL (ref <150)
VLDL: 15 mg/dL (ref 0–40)

## 2017-12-30 LAB — BASIC METABOLIC PANEL
Anion gap: 7 (ref 5–15)
BUN: 14 mg/dL (ref 6–20)
CO2: 29 mmol/L (ref 22–32)
Calcium: 9.5 mg/dL (ref 8.9–10.3)
Chloride: 103 mmol/L (ref 101–111)
Creatinine, Ser: 0.73 mg/dL (ref 0.44–1.00)
GFR calc Af Amer: >60 mL/min (ref >60)
GFR calc non Af Amer: >60 mL/min (ref >60)
Glucose, Bld: 128 mg/dL — ABNORMAL HIGH (ref 65–99)
Potassium: 3.1 mmol/L — ABNORMAL LOW (ref 3.5–5.1)
Sodium: 139 mmol/L (ref 135–145)

## 2017-12-30 LAB — GLUCOSE, CAPILLARY: Glucose-Capillary: 115 mg/dL — ABNORMAL HIGH (ref 65–99)

## 2017-12-30 LAB — TSH: TSH: 2.063 u[IU]/mL (ref 0.350–4.500)

## 2017-12-30 MED ORDER — TRAZODONE HCL 100 MG PO TABS
100.0000 mg | ORAL_TABLET | Freq: Every evening | ORAL | Status: DC | PRN
  Administered 2017-12-30 – 2018-01-01 (×3): 100 mg via ORAL
  Filled 2017-12-30 (×3): qty 1

## 2017-12-30 MED ORDER — HALOPERIDOL DECANOATE 100 MG/ML IM SOLN
50.0000 mg | Freq: Once | INTRAMUSCULAR | Status: AC
  Administered 2017-12-31: 50 mg via INTRAMUSCULAR
  Filled 2017-12-30: qty 0.5

## 2017-12-30 NOTE — Progress Notes (Signed)
Patient ID: Ricky Doan, female   DOB: 07-Jun-1951, 67 y.o.   MRN: [id number] CSW spoke with Pt's [name] social worker Juluis Pitch.  They have been working with her since her eviction. She has put in several applications for different housing developments.  She is not aware of the status of them but agreed to check. She said there is one house for rent she is aware of and provided CSW with that info. Ms. Zollner wont have the money for that until next month though.  She will investigate if Pt has medicaid or not and let CSW know.  This would make [facility] home an option. She also informed me that Ms. Starry has a relative in [place] that is willing to have her there but she had been refusing to go there stating it's "too far away".  CSW agreed to keep in touch with her regarding Pt's estimated length of stay.  Ms. Wynetta Emery can be reached at [phone] Or her cell [place], LCSW
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
## 2017-12-30 NOTE — BHH Group Notes (Signed)
[place] Group Notes:  (Nursing/MHT/Case Management/Adjunct)  Date:  12/30/2017  Time:  9:04 PM  Type of Therapy:  Group Therapy  Participation Level:  Active  Participation Quality:  Appropriate  Affect:  Appropriate  Cognitive:  Appropriate  Insight:  Good  Engagement in Group:  Engaged  Modes of Intervention:  Support  Summary of Progress/Problems:  Lauren [name] 12/30/2017, 9:04 PM

## 2017-12-30 NOTE — Plan of Care (Signed)
Pleasant and cooperative.  Denies SI/HI/AVH.  Disorganized thought processes.  Minimal interaction noted with peers as well as staff.   Problem: Health Behavior/Discharge Planning: Goal: Ability to manage health-related needs will improve Outcome: Progressing   Problem: Activity: Goal: Risk for activity intolerance will decrease Outcome: Progressing   Problem: Nutrition: Goal: Adequate nutrition will be maintained Outcome: Progressing   Problem: Coping: Goal: Level of anxiety will decrease Outcome: Progressing   Problem: Safety: Goal: Ability to remain free from injury will improve Outcome: Progressing   Problem: Coping: Goal: Coping ability will improve Outcome: Progressing Goal: Will verbalize feelings Outcome: Progressing   Problem: Health Behavior/Discharge Planning: Goal: Ability to make decisions will improve Outcome: Progressing   Problem: Safety: Goal: Periods of time without injury will increase Outcome: Progressing

## 2017-12-30 NOTE — BHH Group Notes (Signed)
CSW Group Therapy Note  12/30/2017  Time:  0900  Type of Therapy and Topic: Group Therapy: Goals Group: SMART Goals    Participation Level:  Active    Description of Group:   The purpose of a daily goals group is to assist and guide patients in setting recovery/wellness-related goals. The objective is to set goals as they relate to the crisis in which they were admitted. Patients will be using SMART goal modalities to set measurable goals. Characteristics of realistic goals will be discussed and patients will be assisted in setting and processing how one will reach their goal. Facilitator will also assist patients in applying interventions and coping skills learned in psycho-education groups to the SMART goal and process how one will achieve defined goal.    Therapeutic Goals:  -Patients will develop and document one goal related to or their crisis in which brought them into treatment.  -Patients will be guided by LCSW using SMART goal setting modality in how to set a measurable, attainable, realistic and time sensitive goal.  -Patients will process barriers in reaching goal.  -Patients will process interventions in how to overcome and successful in reaching goal.    Patient's Goal:  Pt continues to work towards their tx goals but has not yet reached them. Pt was able to appropriately participate in group discussion, and was able to offer support/validation to other group members.  Pt reported her goal for the day is to, "work on my depression by attending at least two groups by the end of the day."   Therapeutic Modalities:  Motivational Interviewing  Public relations account executive Therapy  Crisis Intervention Model  SMART goals setting  Alden Hipp, MSW, LCSW Clinical Social Worker 12/30/2017 9:54 AM

## 2017-12-30 NOTE — Progress Notes (Signed)
Received Lauren [name] this PM, she is pleasant and compliant with her medications. She is OOB in the milieu and attending the group therapy sessions with her peers.
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
## 2017-12-30 NOTE — BHH Suicide Risk Assessment (Signed)
[facility] Admission Suicide Risk Assessment   Nursing information obtained from:    Demographic factors:   67 yo female, homeless Current Mental Status:   depressed Loss Factors:   housing Historical Factors:   psychosis Risk Reduction Factors:     Total Time spent with patient: 1 hour Principal Problem: Schizoaffective disorder ([place]) Diagnosis:   Patient Active Problem List   Diagnosis Date Noted  . Schizophrenia ([place]) [F20.9] 11/04/2017    Priority: High  . Schizoaffective disorder ([place]) [F25.9] 12/29/2017  . Morbid obesity ([place]) [E66.01] 12/09/2017  . Meningioma ([place]) [D32.9] 11/25/2017  . Atherosclerosis of aorta ([place]) [I70.0] 11/25/2017  . Calcification of coronary artery [I25.10, I25.84] 11/25/2017  . Acid reflux [K21.9] 06/29/2015  . Diabetes mellitus type 2, controlled, without complications ([place]) [Z60.1] 06/29/2015  . Cardiac murmur [R01.1] 06/29/2015  . Arthritis of knee, degenerative [M17.10] 06/28/2015  . Insomnia w/ sleep apnea [G47.00, G47.30] 03/21/2015  . Anxiety disorder [F41.9] 03/21/2015  . Hypertension [I10] 03/21/2015  . HLD (hyperlipidemia) [E78.5] 03/21/2015  . Urge incontinence [N39.41] 03/21/2015   Subjective Data: See H&P  Continued Clinical Symptoms:  Alcohol Use Disorder Identification Test Final Score (AUDIT): 0 The "Alcohol Use Disorders Identification Test", Guidelines for Use in Primary Care, Second Edition.  World Pharmacologist [facility]). Score between 0-7:  no or low risk or alcohol related problems. Score between 8-15:  moderate risk of alcohol related problems. Score between 16-19:  high risk of alcohol related problems. Score 20 or above:  warrants further diagnostic evaluation for alcohol dependence and treatment.   CLINICAL FACTORS:   Schizophrenia:   Paranoid or undifferentiated type     COGNITIVE FEATURES THAT CONTRIBUTE TO RISK:  None    SUICIDE RISK:   Minimal: No identifiable suicidal ideation.  Patients presenting with no  risk factors but with morbid ruminations; may be classified as minimal risk based on the severity of the depressive symptoms  PLAN OF CARE: See H&P  I certify that inpatient services furnished can reasonably be expected to improve the patient's condition.   Marylin Crosby, MD 12/30/2017, 3:55 PM

## 2017-12-30 NOTE — BHH Counselor (Signed)
12/30/2017 1PM  Type of Therapy/Topic:  Group Therapy:  Feelings about Diagnosis  Participation Level:  Did Not Attend   Description of Group:   This group will allow patients to explore their thoughts and feelings about diagnoses they have received. Patients will be guided to explore their level of understanding and acceptance of these diagnoses. Facilitator will encourage patients to process their thoughts and feelings about the reactions of others to their diagnosis and will guide patients in identifying ways to discuss their diagnosis with significant others in their lives. This group will be process-oriented, with patients participating in exploration of their own experiences, giving and receiving support, and processing challenge from other group members.   Therapeutic Goals: 1. Patient will demonstrate understanding of diagnosis as evidenced by identifying two or more symptoms of the disorder 2. Patient will be able to express two feelings regarding the diagnosis 3. Patient will demonstrate their ability to communicate their needs through discussion and/or role play  Summary of Patient Progress: The MD came and took the patient from the group before it began.     Therapeutic Modalities:   Cognitive Behavioral Therapy Brief Therapy Feelings Identification    Darin Engels, LCSW 12/30/2017 2:45 PM

## 2017-12-30 NOTE — Progress Notes (Signed)
Recreation Therapy Notes  Date: 12/30/2017  Time: 9:30 am   Location: Craft Room   Behavioral response: N/A   Intervention Topic: Self-esteem  Discussion/Intervention: Patient did not attend group.   Clinical Observations/Feedback:  Patient did not attend group.   Rakayla Ricklefs LRT/CTRS        Tauno Falotico 12/30/2017 10:30 AM

## 2017-12-31 ENCOUNTER — Ambulatory Visit: Payer: Self-pay | Admitting: Family Medicine

## 2017-12-31 LAB — GLUCOSE, CAPILLARY
Glucose-Capillary: 124 mg/dL — ABNORMAL HIGH (ref 65–99)
Glucose-Capillary: 106 mg/dL — ABNORMAL HIGH (ref 65–99)
Glucose-Capillary: 93 mg/dL (ref 65–99)
Glucose-Capillary: 118 mg/dL — ABNORMAL HIGH (ref 65–99)

## 2017-12-31 MED ORDER — ATENOLOL 100 MG PO TABS
100.0000 mg | ORAL_TABLET | Freq: Every day | ORAL | Status: DC
Start: 2017-12-31 — End: 2018-01-05
  Administered 2017-12-31 – 2018-01-04 (×5): 100 mg via ORAL
  Filled 2017-12-31 (×7): qty 1

## 2017-12-31 NOTE — Plan of Care (Signed)
Patient is alert and oriented. Patient denies SI, HI and AVH. Patient states today she feels much better." I am not as anxious and disorganized as yesterday, I am feeling much better today." Patient still appears to be paranoid with medications stating, " I had something that happened to me a few days ago and I just don't trust nobody, I am sorry."  Patient refused her haldol injection this morning stating," I would like to talk to the doctor first." Patient is eating meals regularly; attending groups. Patient complained of a headache which tylenol was given. Nurse will continue to monitor. Safety checks will continue Q 15 minutes. Problem: Education: Goal: Knowledge of General Education information will improve Outcome: Progressing   Problem: Clinical Measurements: Goal: Ability to maintain clinical measurements within normal limits will improve Outcome: Progressing Goal: Will remain free from infection Outcome: Progressing Goal: Diagnostic test results will improve Outcome: Progressing Goal: Respiratory complications will improve Outcome: Progressing Goal: Cardiovascular complication will be avoided Outcome: Progressing

## 2017-12-31 NOTE — Tx Team (Addendum)
Interdisciplinary Treatment and Diagnostic Plan Update  12/31/2017 Time of Session: 11:00am Lauren [name] MRN: [id number]  Principal Diagnosis: Schizoaffective disorder [facility])  Secondary Diagnoses: Principal Problem:   Schizoaffective disorder ([place]) Active Problems:   Hypertension   Acid reflux   Diabetes mellitus type 2, controlled, without complications ([place])   Current Medications:  Current Facility-Administered Medications  Medication Dose Route Frequency Provider Last Rate Last Dose  . acetaminophen (TYLENOL) tablet 650 mg  650 mg Oral Q6H PRN [name], [name], MD   650 mg at 12/31/17 1006  . alum & mag hydroxide-simeth (MAALOX/MYLANTA) 200-200-20 MG/5ML suspension 30 mL  30 mL Oral Q4H PRN [name], [name], MD      . aspirin EC tablet 81 mg  81 mg Oral Daily [name], [name], MD   81 mg at 12/31/17 0813  . benztropine (COGENTIN) tablet 0.5 mg  0.5 mg Oral QHS [name], [name], MD   0.5 mg at 12/30/17 2050  . haloperidol (HALDOL) tablet 10 mg  10 mg Oral QHS [name], [name], MD   10 mg at 12/30/17 2050  . haloperidol decanoate (HALDOL DECANOATE) 100 MG/ML injection 50 mg  50 mg Intramuscular Once Marylin Crosby, MD   Stopped at 12/31/17 0815  . lisinopril (PRINIVIL,ZESTRIL) tablet 20 mg  20 mg Oral Daily [name], [name], MD   20 mg at 12/31/17 0813  . magnesium hydroxide (MILK OF MAGNESIA) suspension 30 mL  30 mL Oral Daily PRN [name], [name], MD      . metFORMIN (GLUCOPHAGE) tablet 500 mg  500 mg Oral BID WC [name], [name], MD   500 mg at 12/31/17 4481  . pantoprazole (PROTONIX) EC tablet 40 mg  40 mg Oral Daily [name], [name], MD   40 mg at 12/31/17 8563  . rosuvastatin (CRESTOR) tablet 40 mg  40 mg Oral q1800 [name], [name], MD   40 mg at 12/30/17 1621  . traZODone (DESYREL) tablet 100 mg  100 mg Oral QHS PRN McNew, Tyson Babinski, MD   100 mg at 12/30/17 2050   PTA Medications: Medications Prior to Admission  Medication Sig Dispense Refill Last Dose  . aspirin EC 81 MG  tablet Take 81 mg by mouth daily before breakfast.    12/28/2017 at Unknown time  . benztropine (COGENTIN) 0.5 MG tablet Take 1 tablet (0.5 mg total) by mouth at bedtime. 60 tablet 0 12/28/2017 at Unknown time  . Bioflavonoid Products (VITAMIN C) CHEW Chew 1 tablet by mouth daily as needed (for immune system support).   12/28/2017 at Unknown time  . Capsaicin (ZOSTRIX EX) Apply 1 application topically 4 (four) times daily as needed (for knee pain.).   12/28/2017 at Unknown time  . haloperidol (HALDOL) 10 MG tablet Take 1 tablet (10 mg total) by mouth at bedtime. 30 tablet 0 12/28/2017 at Unknown time  . haloperidol decanoate (HALDOL DECANOATE) 100 MG/ML injection Inject 0.5 mLs (50 mg total) into the muscle every 28 (twenty-eight) days. Next injection due on 12/02/17 1 mL 0 12/28/2017 at Unknown time  . ibuprofen (ADVIL,MOTRIN) 200 MG tablet Take 600 mg by mouth every 8 (eight) hours as needed (for knee pain.).   12/28/2017 at Unknown time  . lisinopril (PRINIVIL,ZESTRIL) 20 MG tablet Take 1 tablet (20 mg total) by mouth daily. 30 tablet 0 12/28/2017 at Unknown time  . Lotion Base LOTN Apply 1 application topically 4 (four) times daily as needed (for knee pain.). Two [employer] Goats Essential Lotion (Aloe  Sharia Reeve, and 6 natural anti-inflammatory essential oils; [place], [place], [place], [place], Peppermint and [place])   12/28/2017 at Unknown time  . metFORMIN (GLUCOPHAGE) 500 MG tablet Take 1 tablet (500 mg total) by mouth 2 (two) times daily with a meal. 60 tablet 0 12/28/2017 at Unknown time  . omeprazole (PRILOSEC) 20 MG capsule TAKE 1 CAPSULE BY MOUTH  DAILY 90 capsule 0 12/28/2017 at Unknown time  . rosuvastatin (CRESTOR) 40 MG tablet Take 1 tablet (40 mg total) by mouth daily. 30 tablet 0 12/28/2017 at Unknown time  . traZODone (DESYREL) 100 MG tablet Take 100 mg by mouth at bedtime as needed for sleep.   12/28/2017 at Unknown time    Patient Stressors: Financial  difficulties Loss of suppoottt system  Patient Strengths: Ability for insight Capable of independent living General fund of knowledge  Treatment Modalities: Medication Management, Group therapy, Case management,  1 to 1 session with clinician, Psychoeducation, Recreational therapy.   Physician Treatment Plan for Primary Diagnosis: Schizoaffective disorder ([place]) Long Term Goal(s): Improvement in symptoms so as ready for discharge   Short Term Goals: Ability to demonstrate self-control will improve  Medication Management: Evaluate patient's response, side effects, and tolerance of medication regimen.  Therapeutic Interventions: 1 to 1 sessions, Unit Group sessions and Medication administration.  Evaluation of Outcomes: Progressing  Physician Treatment Plan for Secondary Diagnosis: Principal Problem:   Schizoaffective disorder ([place]) Active Problems:   Hypertension   Acid reflux   Diabetes mellitus type 2, controlled, without complications ([place])  Long Term Goal(s): Improvement in symptoms so as ready for discharge   Short Term Goals: Ability to demonstrate self-control will improve     Medication Management: Evaluate patient's response, side effects, and tolerance of medication regimen.  Therapeutic Interventions: 1 to 1 sessions, Unit Group sessions and Medication administration.  Evaluation of Outcomes: Progressing   RN Treatment Plan for Primary Diagnosis: Schizoaffective disorder ([place]) Long Term Goal(s): Knowledge of disease and therapeutic regimen to maintain health will improve  Short Term Goals: Ability to identify and develop effective coping behaviors will improve and Compliance with prescribed medications will improve  Medication Management: RN will administer medications as ordered by provider, will assess and evaluate patient's response and provide education to patient for prescribed medication. RN will report any adverse and/or side effects to prescribing  provider.  Therapeutic Interventions: 1 on 1 counseling sessions, Psychoeducation, Medication administration, Evaluate responses to treatment, Monitor vital signs and CBGs as ordered, Perform/monitor CIWA, COWS, AIMS and Fall Risk screenings as ordered, Perform wound care treatments as ordered.  Evaluation of Outcomes: Progressing   LCSW Treatment Plan for Primary Diagnosis: Schizoaffective disorder ([place]) Long Term Goal(s): Safe transition to appropriate next level of care at discharge, Engage patient in therapeutic group addressing interpersonal concerns.  Short Term Goals: Engage patient in aftercare planning with referrals and resources, Identify triggers associated with mental health/substance abuse issues and Increase skills for wellness and recovery  Therapeutic Interventions: Assess for all discharge needs, 1 to 1 time with Social worker, Explore available resources and support systems, Assess for adequacy in community support network, Educate family and significant other(s) on suicide prevention, Complete Psychosocial Assessment, Interpersonal group therapy.  Evaluation of Outcomes: Progressing   Progress in Treatment: Attending groups: Yes. Participating in groups: Yes. Taking medication as prescribed: Yes. Toleration medication: Yes. Family/Significant other contact made: No, will contact:    Patient understands diagnosis: Yes. Discussing patient identified problems/goals with staff: Yes. Medical problems stabilized or resolved:  Yes. Denies suicidal/homicidal ideation: Yes. Issues/concerns per patient self-inventory: Yes. Other:    New problem(s) identified: Yes, Describe:     New Short Term/Long Term Goal(s):to be able to focus, have better attention. To improve depression so she can continue working toward a place to live.  Discharge Plan or Barriers: TBD-pursuing leads on affordable housing.  Reason for Continuation of Hospitalization:  Anxiety Depression Hallucinations Medication stabilization Other; describe Coordination of Aftercare  Estimated Length of Stay: 3-5 days  Recreational Therapy: Patient Stressors: Friends, living situation  Patient Goal: Patient will identify 3 positive coping skills to decrease depressive symptoms within 5 recreation therapy group sessions  Attendees: Patient:Lauren [name] 12/31/2017 11:54 AM  Physician: Amador Cunas, MD 12/31/2017 11:54 AM  Nursing: Polly Cobia, RN 12/31/2017 11:54 AM  RN Care Manager: 12/31/2017 11:54 AM  Social Worker: Dossie Arbour, LCSW 12/31/2017 11:54 AM  Recreational Therapist: Roanna Epley, LRT 12/31/2017 11:54 AM  Other: Alden Hipp, LCSW 12/31/2017 11:54 AM  Other: Darin Engels, LCSWA 12/31/2017 11:54 AM  Margurite Auerbach PA Student 12/31/2017 11:54 AM    Scribe for Treatment Team: [name], LCSW 12/31/2017 11:54 AM

## 2017-12-31 NOTE — Progress Notes (Signed)
Recreation Therapy Notes  Date: 12/31/2017  Time: 9:30 am  Location: Craft Room  Behavioral response: Appropriate  Intervention Topic: Relaxation  Discussion/Intervention:  Group content today was focused on relaxation. The group defined relaxation and identified healthy ways to relax. Individuals expressed how much time they spend relaxing. Patients expressed how much their life would be if they did not make time for themselves to relax. The group stated ways they could improve their relaxation techniques in the future.  Individuals participated in the intervention "Time to Relax" where they had a chance to experience different relaxation techniques.  Clinical Observations/Feedback:  Patient came to group and identified listening to music as a way she relaxes. Individual stated life would be stressful if she never had time to relax. She left group early due to unknown reasons and never returned.  [name] LRT/CTRS         Lauren [name] 12/31/2017 12:27 PM

## 2017-12-31 NOTE — Progress Notes (Signed)
D- Patient alert and oriented. Patient presents in a pleasant mood on assessment stating that she is feeling better today, she's not as confused as she was yesterday. Patient reports that she always has "pain in my knees". Patient endorses [place] stating to this writer "I hear voices, people talking. Sometimes it's in my head and sometimes it's out. They are revealing to me things about my past, why I made mistakes, just like flashbacks". Patient rates her depression level a "7/10" stating to this writer "I don't know why I'm depressed, but I haven't had any crying spells here recently". Patient denies SI/HI/VH as well as any signs/symptoms of anxiety at this time. Patient's goals for today is "to be able to concentrate and focus on the things to get me out of this fog I'm in, this depression".  A- Scheduled medications administered to patient, per MD orders. Support and encouragement provided.  Routine safety checks conducted every 15 minutes. Patient informed to notify staff with problems or concerns.  R- No adverse drug reactions noted. Patient contracts for safety at this time. Patient compliant with medications and treatment plan. Patient receptive, calm, and cooperative. Patient interacts well with others on the unit.  Patient remains safe at this time.

## 2017-12-31 NOTE — Plan of Care (Addendum)
Patient found awake in bed upon my arrival. Patient is neither visible nor social throughout the evening. Continues to complain of depression and AH. Voices are saying negative things about the patient, reminding her "of all the mistakes I've made." Denies HI/SI. Patient remains religiously preoccupied but improved. Hygiene is improved. Denies pain. CBG 118. Given trazodone for sleep. Will monitor for efficacy. Compliant with HS medications and staff direction. Q 15 minute checks maintained. Will continue to monitor throughout the shift.  Patient slept 7 hours. No apparent distress. CBG 100. Will endorse care to oncoming shift.  Problem: Education: Goal: Knowledge of General Education information will improve Outcome: Progressing   Problem: Health Behavior/Discharge Planning: Goal: Ability to manage health-related needs will improve Outcome: Progressing   Problem: Clinical Measurements: Goal: Ability to maintain clinical measurements within normal limits will improve Outcome: Progressing   Problem: Activity: Goal: Risk for activity intolerance will decrease Outcome: Progressing   Problem: Coping: Goal: Level of anxiety will decrease Outcome: Progressing   Problem: Elimination: Goal: Will not experience complications related to bowel motility Outcome: Progressing Goal: Will not experience complications related to urinary retention Outcome: Progressing   Problem: Coping: Goal: Will verbalize feelings Outcome: Progressing   Problem: Health Behavior/Discharge Planning: Goal: Compliance with therapeutic regimen will improve Outcome: Progressing

## 2017-12-31 NOTE — Progress Notes (Signed)
[facility] MD Progress Note  12/31/2017 3:17 PM Lauren [name]  MRN:  [id number]   Subjective:  Pt states that she is feeling "suspicious." She has a hard time describing it. She states that she felt suspicious about the questions the social worker was asking her for the application for her housing. She laughs at this and does have insight into the paranoia. She asked very appropriate questions about the Haldol and why it was a different color than she used to get which was a lavender pill. We discussed that sometimes different manufacturers have different labeling and coloring. She was able to adequately process this. She states taht she does not have good concentration dn "just wants to feel better." She has been on Haldol for a long time and has been helpful for her. Reminded her that she was on haldol last hospitalization and she got much better. She did agree to this. She states that she knows she has very little trust in people and how this has affected her. She is calm and pleasant. She is in agreement to get another haldol injection to equal 100 mg monthly.   Principal Problem: Schizoaffective disorder ([place]) Diagnosis:   Patient Active Problem List   Diagnosis Date Noted  . Schizophrenia ([place]) [F20.9] 11/04/2017    Priority: High  . Schizoaffective disorder ([place]) [F25.9] 12/29/2017  . Morbid obesity ([place]) [E66.01] 12/09/2017  . Meningioma ([place]) [D32.9] 11/25/2017  . Atherosclerosis of aorta ([place]) [I70.0] 11/25/2017  . Calcification of coronary artery [I25.10, I25.84] 11/25/2017  . Acid reflux [K21.9] 06/29/2015  . Diabetes mellitus type 2, controlled, without complications ([place]) [N39.7] 06/29/2015  . Cardiac murmur [R01.1] 06/29/2015  . Arthritis of knee, degenerative [M17.10] 06/28/2015  . Insomnia w/ sleep apnea [G47.00, G47.30] 03/21/2015  . Anxiety disorder [F41.9] 03/21/2015  . Hypertension [I10] 03/21/2015  . HLD (hyperlipidemia) [E78.5] 03/21/2015  . Urge incontinence  [N39.41] 03/21/2015   Total Time spent with patient: 20 minutes  Past Psychiatric History: See H&P  Past Medical History:  Past Medical History:  Diagnosis Date  . Anxiety   . Apnea, sleep 02/02/2014  . Awareness of heartbeats 02/02/2014  . Breathlessness on exertion 02/02/2014  . Diabetes mellitus   . Encounter for pre-employment examination 06/29/2015  . Excessive sweating 07/05/2015  . Fibromyalgia   . GERD (gastroesophageal reflux disease)   . Gravida 1 10/26/2015   1.    . Heart murmur   . Herniated disc   . Hyperlipidemia   . Hypertension   . Itch of skin 10/26/2015  . Lack of bladder control   . Lung tumor   . Rheumatoid arthritis ([place])   . Schizoaffective disorder ([place])   . Screening for cervical cancer 07/29/2017  . Seizure [facility])    after brain surgery 2012. last seizure 2013!  [name] Sex counseling 10/26/2015  . Sleep apnea   . 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 Stiffness of both knees 06/22/2015    Past Surgical History:  Procedure Laterality Date  . BRAIN TUMOR EXCISION  2012   benign  . CARDIAC CATHETERIZATION  2008  . CESAREAN SECTION    . CYST REMOVAL HAND    . LUNG LOBECTOMY  1977   benign tumor  . 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  Heart disease Brother   . Depression Mother   . Heart attack Mother   . Stroke Mother   . Alcohol abuse Father   . Stroke Father   . Diabetes Sister   . Diabetes Sister   . Stomach cancer Sister   . Kidney disease Sister   . COPD Brother   . Lung cancer Brother   . Diabetes Brother    Family Psychiatric  History: See H&P Social History:  Social History   Substance and Sexual Activity  Alcohol Use No  . Alcohol/week: 0.0 oz     Social History   Substance and Sexual Activity   Drug Use No    Social History   Socioeconomic History  . Marital status: Single    Spouse name: Not on file  . Number of children: Not on file  . Years of education: Not on file  . Highest education level: Not on file  Occupational History  . Not on file  Social Needs  . Financial resource strain: Not hard at all  . Food insecurity:    Worry: Never true    Inability: Never true  . Transportation needs:    Medical: No    Non-medical: No  Tobacco Use  . Smoking status: Never Smoker  . Smokeless tobacco: Never Used  Substance and Sexual Activity  . Alcohol use: No    Alcohol/week: 0.0 oz  . Drug use: No  . Sexual activity: Not Currently  Lifestyle  . Physical activity:    Days per week: 0 days    Minutes per session: 0 min  . Stress: Only a little  Relationships  . Social connections:    Talks on phone: Not on file    Gets together: Not on file    Attends religious service: More than 4 times per year    Active member of club or organization: No    Attends meetings of clubs or organizations: Never    Relationship status: Married  Other Topics Concern  . Not on file  Social History Narrative  . Not on file   Additional Social History:                         Sleep: Fair  Appetite:  Fair  Current Medications: Current Facility-Administered Medications  Medication Dose Route Frequency Provider Last Rate Last Dose  . acetaminophen (TYLENOL) tablet 650 mg  650 mg Oral Q6H PRN [name], [name], MD   650 mg at 12/31/17 1006  . alum & mag hydroxide-simeth (MAALOX/MYLANTA) 200-200-20 MG/5ML suspension 30 mL  30 mL Oral Q4H PRN [name], [name], MD      . aspirin EC tablet 81 mg  81 mg Oral Daily [name], [name], MD   81 mg at 12/31/17 0813  . atenolol (TENORMIN) tablet 100 mg  100 mg Oral Daily McNew, Tyson Babinski, MD   100 mg at 12/31/17 1514  . benztropine (COGENTIN) tablet 0.5 mg  0.5 mg Oral QHS [name], [name], MD   0.5 mg at 12/30/17 2050  . haloperidol  (HALDOL) tablet 10 mg  10 mg Oral QHS [name], [name], MD   10 mg at 12/30/17 2050  . lisinopril (PRINIVIL,ZESTRIL) tablet 20 mg  20 mg Oral Daily [name], [name], MD   20 mg at 12/31/17 0813  . magnesium hydroxide (MILK OF MAGNESIA) suspension 30 mL  30 mL Oral Daily PRN [name], [name], MD      .  metFORMIN (GLUCOPHAGE) tablet 500 mg  500 mg Oral BID WC [name], [name], MD   500 mg at 12/31/17 3662  . pantoprazole (PROTONIX) EC tablet 40 mg  40 mg Oral Daily [name], [name], MD   40 mg at 12/31/17 9476  . rosuvastatin (CRESTOR) tablet 40 mg  40 mg Oral q1800 [name], [name], MD   40 mg at 12/30/17 1621  . traZODone (DESYREL) tablet 100 mg  100 mg Oral QHS PRN McNew, Tyson Babinski, MD   100 mg at 12/30/17 2050    Lab Results:  Results for orders placed or performed during the hospital encounter of 12/29/17 (from the past 48 hour(s))  Glucose, capillary     Status: Abnormal   Collection Time: 12/29/17  8:48 PM  Result Value Ref Range   Glucose-Capillary 141 (H) 65 - 99 mg/dL  Hemoglobin A1c     Status: Abnormal   Collection Time: 12/30/17  6:48 AM  Result Value Ref Range   Hgb A1c MFr Bld 6.4 (H) 4.8 - 5.6 %    Comment: (NOTE) Pre diabetes:          5.7%-6.4% Diabetes:              >6.4% Glycemic control for   <7.0% adults with diabetes    Mean Plasma Glucose 136.98 mg/dL    Comment: Performed at [facility] [address] [place], [place] [address]  Lipid panel     Status: None   Collection Time: 12/30/17  6:48 AM  Result Value Ref Range   Cholesterol 137 0 - 200 mg/dL   Triglycerides 75 <150 mg/dL   HDL 49 >40 mg/dL   Total CHOL/HDL Ratio 2.8 RATIO   VLDL 15 0 - 40 mg/dL   LDL Cholesterol 73 0 - 99 mg/dL    Comment:        Total Cholesterol/HDL:CHD Risk Coronary Heart Disease Risk Table                     Men   Women  1/2 Average Risk   3.4   3.3  Average Risk       5.0   4.4  2 X Average Risk   9.6   7.1  3 X Average Risk  23.4   11.0        Use the calculated  Patient Ratio above and the CHD Risk Table to determine the patient's CHD Risk.        ATP III CLASSIFICATION (LDL):  <100     mg/dL   Optimal  100-129  mg/dL   Near or Above                    Optimal  130-159  mg/dL   Borderline  160-189  mg/dL   High  >190     mg/dL   Very High Performed at [facility], [address] [place], [place] [address]   TSH     Status: None   Collection Time: 12/30/17  6:48 AM  Result Value Ref Range   TSH 2.063 0.350 - 4.500 uIU/mL    Comment: Performed by a 3rd Generation assay with a functional sensitivity of <=0.01 uIU/mL. Performed at [facility], [address] [place], [place] [address]   Basic metabolic panel     Status: Abnormal   Collection Time: 12/30/17  6:48 AM  Result Value Ref Range   Sodium 139 135 -  145 mmol/L   Potassium 3.1 (L) 3.5 - 5.1 mmol/L   Chloride 103 101 - 111 mmol/L   CO2 29 22 - 32 mmol/L   Glucose, Bld 128 (H) 65 - 99 mg/dL   BUN 14 6 - 20 mg/dL   Creatinine, Ser 0.73 0.44 - 1.00 mg/dL   Calcium 9.5 8.9 - 10.3 mg/dL   GFR calc non Af Amer >60 >60 mL/min   GFR calc Af Amer >60 >60 mL/min    Comment: (NOTE) The eGFR has been calculated using the CKD EPI equation. This calculation has not been validated in all clinical situations. eGFR's persistently <60 mL/min signify possible Chronic Kidney Disease.    Anion gap 7 5 - 15    Comment: Performed at [facility], [address] [place], [place] [address]  Glucose, capillary     Status: Abnormal   Collection Time: 12/30/17  6:49 AM  Result Value Ref Range   Glucose-Capillary 115 (H) 65 - 99 mg/dL  Glucose, capillary     Status: Abnormal   Collection Time: 12/31/17  6:10 AM  Result Value Ref Range   Glucose-Capillary 124 (H) 65 - 99 mg/dL  Glucose, capillary     Status: Abnormal   Collection Time: 12/31/17 11:23 AM  Result Value Ref Range   Glucose-Capillary 106 (H) 65 - 99 mg/dL    Blood Alcohol level:  Lab Results   Component Value Date   ETH <10 12/28/2017   ETH <10 61/60/7371    Metabolic Disorder Labs: Lab Results  Component Value Date   HGBA1C 6.4 (H) 12/30/2017   MPG 136.98 12/30/2017   MPG 131.24 11/05/2017   No results found for: PROLACTIN Lab Results  Component Value Date   CHOL 137 12/30/2017   TRIG 75 12/30/2017   HDL 49 12/30/2017   CHOLHDL 2.8 12/30/2017   VLDL 15 12/30/2017   LDLCALC 73 12/30/2017   LDLCALC 163 (H) 11/05/2017    Physical Findings: AIMS:  , ,  ,  ,    CIWA:    COWS:     Musculoskeletal: Strength & Muscle Tone: within normal limits Gait & Station: normal Patient leans: N/A  Psychiatric Specialty Exam: Physical Exam  ROS  Blood pressure (!) 155/96, pulse 77, temperature 97.8 F (36.6 C), temperature source Oral, resp. rate 20, height 5' 2.8" (1.595 m), weight 106.1 kg (234 lb), SpO2 98 %.Body mass index is 41.72 kg/m.  General Appearance: Casual  Eye Contact:  Fair  Speech:  Stuttering  Volume:  Normal  Mood:  Depressed  Affect:  Constricted  Thought Process:  Coherent and Goal Directed  Orientation:  Full (Time, Place, and Person)  Thought Content:  Paranoid Ideation  Suicidal Thoughts:  No  Homicidal Thoughts:  No  Memory:  Immediate;   Fair  Judgement:  Fair  Insight:  Lacking  Psychomotor Activity:  Normal  Concentration:  Concentration: Poor  Recall:  [employer] of Knowledge:  Fair  Language:  Fair  Akathisia:  No      Assets:  Resilience  ADL's:  Intact  Cognition:  WNL  Sleep:  Number of Hours: 6     Treatment Plan Summary: 67 yo female admitted due to depression and paranoia. Pt does have baseline paranoia but does worsen as she decompensates and stops medications. She is paranoid today but is able to rationally process through the inconsistencies and is open to education. She laughs and talks about how she does have low trust in  people and has insight into how the things she is paranoid may not really be happening. She is  not quite as decompensated as she was at last visit. She is open to getting second Haldol Dec injection today to equal a total of 100 mg monthly. She has done well on a higher dose last visit.   Plan:  Schizophrenia -Will give additional 50 mg dose of Haldol decanoate to total 100 mg monthly. Will continue oral for now -Continue Cogentin 0.5 mg qhs  HTN -BP elevated today -She requests to restart Atenolol 100 mg -Continue Lisinopril 20 mg daily  DM -Continue Metformin 500 mg BID  Dispo -CSW is working hard on [employer] for housing. She will also be referred to [place] team for closer follow up in the community.   Marylin [place], MD 12/31/2017, 3:17 PM

## 2017-12-31 NOTE — Progress Notes (Signed)
Recreation Therapy Notes  INPATIENT RECREATION THERAPY ASSESSMENT  Patient Details Name: Pixie Burgener MRN: [id number] DOB: [birth year] Today's Date: 12/31/2017       Information Obtained From: Patient  Able to Participate in Assessment/Interview: Yes  Patient Presentation: Responsive  Reason for Admission (Per Patient): Other (Comments)(Depression, medication, eviction)  Patient Stressors: Other (Comment), Friends(living situation)  Coping Skills:   Read, Prayer  Leisure Interests (2+):  Music - Listen, Individual - Reading  Frequency of Recreation/Participation: Weekly  Awareness of Community Resources:  Yes  Community Resources:     Current Use: No  If no, Barriers?: Physical  Expressed Interest in [place] of Residence:  Insurance underwriter  Patient Main Form of Transportation: Musician  Patient Strengths:  Honest, Advertising account planner, Kind, Giving  Patient Identified Areas of Improvement:  Help the children in the community  Patient Goal for Hospitalization:  Get concentration back and become able to rest  Current SI (including self-harm):  No  Current HI:  No  Current AVH: No  Staff Intervention Plan: Group Attendance, Collaborate with Interdisciplinary Treatment Team  Consent to Intern Participation: N/A  Monea Pesantez 12/31/2017, 2:52 PM

## 2017-12-31 NOTE — Plan of Care (Addendum)
Patient found in room doing crossword puzzle upon my arrival. Patient is neither visible nor social this evening. Patient is very talkative this evening. Reports that she has not had a tearful outburst today which she says is usually a daily occurrence. Reports decreased concentration. Patient is progressing in utilizing coping skills as she feels the crossword puzzles will help with that. Reports racing thoughts about "mistakes I've made." Reports voices inside and outside her head. The ones outside her "condemn" her. Denies SI/HI/AVH. Continues to be depressed but attempting to get better. Reports eating and voiding adequately. Remains suspicious regarding medications and reads packages several times. Compliant with HS medications and staff direction. Q 15 minute checks maintained. Will continue to monitor throughout the shift. Patient slept 6 hours. No apparent distress. Will endorse care to oncoming shift.  Problem: Education: Goal: Knowledge of General Education information will improve Outcome: Progressing   Problem: Health Behavior/Discharge Planning: Goal: Ability to manage health-related needs will improve Outcome: Progressing   Problem: Coping: Goal: Level of anxiety will decrease Outcome: Progressing   Problem: Elimination: Goal: Will not experience complications related to bowel motility Outcome: Progressing Goal: Will not experience complications related to urinary retention Outcome: Progressing   Problem: Education: Goal: Knowledge of the prescribed therapeutic regimen will improve Outcome: Progressing   Problem: Coping: Goal: Will verbalize feelings Outcome: Progressing   Problem: Health Behavior/Discharge Planning: Goal: Compliance with therapeutic regimen will improve Outcome: Progressing

## 2018-01-01 LAB — GLUCOSE, CAPILLARY
Glucose-Capillary: 100 mg/dL — ABNORMAL HIGH (ref 65–99)
Glucose-Capillary: 121 mg/dL — ABNORMAL HIGH (ref 65–99)
Glucose-Capillary: 112 mg/dL — ABNORMAL HIGH (ref 65–99)
Glucose-Capillary: 101 mg/dL — ABNORMAL HIGH (ref 65–99)

## 2018-01-01 MED ORDER — POTASSIUM CHLORIDE 20 MEQ PO PACK
40.0000 meq | PACK | Freq: Once | ORAL | Status: AC
  Administered 2018-01-01: 40 meq via ORAL
  Filled 2018-01-01: qty 2

## 2018-01-01 NOTE — BHH Group Notes (Signed)
LCSW Group Therapy Note 01/01/2018 9:00 AM  Type of Therapy and Topic:  Group Therapy:  Setting Goals  Participation Level:  Active  Description of Group: In this process group, patients discussed using strengths to work toward goals and address challenges.  Patients identified two positive things about themselves and one goal they were working on.  Patients were given the opportunity to share openly and support each other's plan for self-empowerment.  The group discussed the value of gratitude and were encouraged to have a daily reflection of positive characteristics or circumstances.  Patients were encouraged to identify a plan to utilize their strengths to work on current challenges and goals.  Therapeutic Goals 1. Patient will verbalize personal strengths/positive qualities and relate how these can assist with achieving desired personal goals 2. Patients will verbalize affirmation of peers plans for personal change and goal setting 3. Patients will explore the value of gratitude and positive focus as related to successful achievement of goals 4. Patients will verbalize a plan for regular reinforcement of personal positive qualities and circumstances.  Summary of Patient Progress:  Lauren [name] was able to actively participate in today's group on goal setting.  Lauren [name] was able to identify a goal that she developed for herself several years ago and used SMART (Specific, Measurable, Attainable, Relevant, and Timebound) to share with other group participants.  Lauren [name] shared that her goal for today is "work on ways to keep me out of the hospital since I have been here 3 times in the past 4 months.  I will work with my doctor to get my medication right this time."     Therapeutic Modalities [place], LCSW 01/01/2018 3:24 PM

## 2018-01-01 NOTE — Progress Notes (Signed)
Recreation Therapy Notes  Date: 01/01/2018  Time: 9:30 am   Location: Craft Room   Behavioral response: N/A   Intervention Topic: Goals  Discussion/Intervention: Patient did not attend group.   Clinical Observations/Feedback:  Patient did not attend group.   Azriel Jakob LRT/CTRS        Yamari Ventola 01/01/2018 10:52 AM

## 2018-01-01 NOTE — BHH Group Notes (Signed)
[facility] Group Notes:  (Nursing/MHT/Case Management/Adjunct)  Date:  01/01/2018  Time:  3:18 PM  Type of Therapy:  Psychoeducational Skills  Participation Level:  Did Not Attend    Drake Leach 01/01/2018, 3:18 PM

## 2018-01-01 NOTE — Plan of Care (Addendum)
Patient found in bed awake upon my arrival. Patient is neither visible nor social this evening. Continues to improve incrementally. Affect is brighter and more animated. Eye contact is improved. States, "I just want to get better enough so I don't have to come back here. I've been here 3 times in just a few months." Patient reports working on goals and attempting to control or ignore voices. Voices continue to remind her of past mistakes and condemn her for it. Denies SI/HI/AVH. Reports depression and anxiety is slightly improved. Remains religiously preoccupied, reading her Bible for hours at a time. Reports eating and voiding adequately. Denies pain. Compliant with HS medications and staff direction. Q 15 minute checks maintained. Will continue to monitor throughout the shift. Patient slept 7 hours. No apparent distress. CBG 99. Compliant with am blood draw. Will endorse care to oncoming shift.   Problem: Education: Goal: Knowledge of General Education information will improve Outcome: Progressing   Problem: Clinical Measurements: Goal: Ability to maintain clinical measurements within normal limits will improve Outcome: Progressing   Problem: Coping: Goal: Level of anxiety will decrease Outcome: Progressing   Problem: Elimination: Goal: Will not experience complications related to bowel motility Outcome: Progressing Goal: Will not experience complications related to urinary retention Outcome: Progressing   Problem: Pain Managment: Goal: General experience of comfort will improve Outcome: Progressing   Problem: Education: Goal: Utilization of techniques to improve thought processes will improve Outcome: Progressing Goal: Knowledge of the prescribed therapeutic regimen will improve Outcome: Progressing   Problem: Education: Goal: Emotional status will improve Outcome: Progressing Goal: Mental status will improve Outcome: Progressing

## 2018-01-01 NOTE — BHH Group Notes (Signed)
  01/01/2018  Time: 1PM  Type of Therapy/Topic:  Group Therapy:  Balance in Life  Participation Level:  Active  Description of Group:   This group will address the concept of balance and how it feels and looks when one is unbalanced. Patients will be encouraged to process areas in their lives that are out of balance and identify reasons for remaining unbalanced. Facilitators will guide patients in utilizing problem-solving interventions to address and correct the stressor making their life unbalanced. Understanding and applying boundaries will be explored and addressed for obtaining and maintaining a balanced life. Patients will be encouraged to explore ways to assertively make their unbalanced needs known to significant others in their lives, using other group members and facilitator for support and feedback.  Therapeutic Goals: 1. Patient will identify two or more emotions or situations they have that consume much of in their lives. 2. Patient will identify signs/triggers that life has become out of balance:  3. Patient will identify two ways to set boundaries in order to achieve balance in their lives:  4. Patient will demonstrate ability to communicate their needs through discussion and/or role plays  Summary of Patient Progress: Pt continues to work towards their tx goals but has not yet reached them. Pt was able to appropriately participate in group discussion, and was able to offer support/validation to other group members. Pt reported feeling her life is out of control when, "I get angry and feel like people are doing things to hurt me." Pt reported one way she can keep her life in a better balance is to, "speak with God."   Therapeutic Modalities:   Cognitive Behavioral Therapy Solution-Focused Therapy Assertiveness Training  Alden Hipp, MSW, LCSW Clinical Social Worker 01/01/2018 2:14 PM

## 2018-01-01 NOTE — Plan of Care (Signed)
Patient verbalizes understanding of the general information that's been provided to her and has not voiced any further questions or concerns at this time. Patient has the ability to manage health-related needs and has been working towards maintaining her clinical measurements within normal limits. Patient has remained free from infection and her diagnostic tests has improved. Patient has been free from any health-related complications thus far. Patient's overall level of comfort has improved and she has remained free from injury on the unit. Patient's risk for impaired skin integrity has decreased and she stated to this writer that she "didn't get a lot of rest" last night. Patient has the ability to cope and has been attending/participating in unit groups without any issues. Patient has the ability to make decisions for herself and has been in compliance with her prescribed therapeutic/medication regimen and all questions/concerns have been addressed and answered at this time. Patient denies SI/HI/VH, however, she is continuing to endorse AH stating to this writer that the "voice patterns are taking me down life's highway, showing me my mistakes in the past". Patient states that her goal for today is "to get better so I can get out of here, I don't want to be coming back any time soon".  Patient rates her anxiety level a "3/10", but she can not explain to this writer why she's feeling this way. Patient has been demonstrating self-control on the unit and remains safe at this time.  Problem: Education: Goal: Knowledge of General Education information will improve Outcome: Progressing   Problem: Health Behavior/Discharge Planning: Goal: Ability to manage health-related needs will improve Outcome: Progressing   Problem: Clinical Measurements: Goal: Ability to maintain clinical measurements within normal limits will improve Outcome: Progressing Goal: Will remain free from infection Outcome:  Progressing Goal: Diagnostic test results will improve Outcome: Progressing Goal: Respiratory complications will improve Outcome: Progressing Goal: Cardiovascular complication will be avoided Outcome: Progressing   Problem: Activity: Goal: Risk for activity intolerance will decrease Outcome: Progressing   Problem: Nutrition: Goal: Adequate nutrition will be maintained Outcome: Progressing   Problem: Coping: Goal: Level of anxiety will decrease Outcome: Progressing   Problem: Elimination: Goal: Will not experience complications related to bowel motility Outcome: Progressing Goal: Will not experience complications related to urinary retention Outcome: Progressing   Problem: Pain Managment: Goal: General experience of comfort will improve Outcome: Progressing   Problem: Safety: Goal: Ability to remain free from injury will improve Outcome: Progressing   Problem: Skin Integrity: Goal: Risk for impaired skin integrity will decrease Outcome: Progressing   Problem: Education: Goal: Utilization of techniques to improve thought processes will improve Outcome: Progressing Goal: Knowledge of the prescribed therapeutic regimen will improve Outcome: Progressing   Problem: Activity: Goal: Interest or engagement in leisure activities will improve Outcome: Progressing Goal: Imbalance in normal sleep/wake cycle will improve Outcome: Progressing   Problem: Coping: Goal: Coping ability will improve Outcome: Progressing Goal: Will verbalize feelings Outcome: Progressing   Problem: Health Behavior/Discharge Planning: Goal: Ability to make decisions will improve Outcome: Progressing Goal: Compliance with therapeutic regimen will improve Outcome: Progressing   Problem: Role Relationship: Goal: Will demonstrate positive changes in social behaviors and relationships Outcome: Progressing   Problem: Safety: Goal: Ability to disclose and discuss suicidal ideas will  improve Outcome: Progressing Goal: Ability to identify and utilize support systems that promote safety will improve Outcome: Progressing   Problem: Self-Concept: Goal: Will verbalize positive feelings about self Outcome: Progressing Goal: Level of anxiety will decrease Outcome: Progressing   Problem: Education:  Goal: Knowledge of [place] General Education information/materials will improve Outcome: Progressing Goal: Emotional status will improve Outcome: Progressing Goal: Mental status will improve Outcome: Progressing Goal: Verbalization of understanding the information provided will improve Outcome: Progressing   Problem: Activity: Goal: Interest or engagement in activities will improve Outcome: Progressing Goal: Sleeping patterns will improve Outcome: Progressing   Problem: Coping: Goal: Ability to verbalize frustrations and anger appropriately will improve Outcome: Progressing Goal: Ability to demonstrate self-control will improve Outcome: Progressing   Problem: Health Behavior/Discharge Planning: Goal: Identification of resources available to assist in meeting health care needs will improve Outcome: Progressing Goal: Compliance with treatment plan for underlying cause of condition will improve Outcome: Progressing   Problem: Physical Regulation: Goal: Ability to maintain clinical measurements within normal limits will improve Outcome: Progressing   Problem: Safety: Goal: Periods of time without injury will increase Outcome: Progressing

## 2018-01-01 NOTE — [facility] Group Notes (Signed)
[facility] Group Notes:  (Nursing/MHT/Case Management/Adjunct)  Date:  01/01/2018  Time:  12:19 AM  Type of Therapy:  Group Therapy  Participation Level:  Active  Participation Quality:  Appropriate  Affect:  Appropriate  Cognitive:  Appropriate  Insight:  Appropriate  Engagement in Group:  Engaged  Modes of Intervention:  Discussion  Summary of Progress/Problems:  Kandis Fantasia 01/01/2018, 12:19 AM

## 2018-01-01 NOTE — Progress Notes (Signed)
[facility] MD Progress Note  01/01/2018 3:08 PM Lauren [name]  MRN:  [id number]   Subjective:  Pt states that she is feeling okay today. She still feels "suspicious" but is able to ask very appropriate questions bout her medications and has been compliant with them. She still feels her concentration is poor but possibly improving slightly. She is sleeping okay and eating okay. She is attending groups and she does enjoy them. She is excited about her new apartment but does admit to some paranoia around this but unable to elaborate on this. She is open to meeting with the ACT team to learn more about it. She states taht she was having tremors prior to hospitalization but these are improving. No tremors noted on exam. No muscle stiffness noted on exam.   Principal Problem: Schizoaffective disorder ([place]) Diagnosis:   Patient Active Problem List   Diagnosis Date Noted  . Schizophrenia ([place]) [F20.9] 11/04/2017    Priority: High  . Schizoaffective disorder ([place]) [F25.9] 12/29/2017  . Morbid obesity ([place]) [E66.01] 12/09/2017  . Meningioma ([place]) [D32.9] 11/25/2017  . Atherosclerosis of aorta ([place]) [I70.0] 11/25/2017  . Calcification of coronary artery [I25.10, I25.84] 11/25/2017  . Acid reflux [K21.9] 06/29/2015  . Diabetes mellitus type 2, controlled, without complications ([place]) [U44.0] 06/29/2015  . Cardiac murmur [R01.1] 06/29/2015  . Arthritis of knee, degenerative [M17.10] 06/28/2015  . Insomnia w/ sleep apnea [G47.00, G47.30] 03/21/2015  . Anxiety disorder [F41.9] 03/21/2015  . Hypertension [I10] 03/21/2015  . HLD (hyperlipidemia) [E78.5] 03/21/2015  . Urge incontinence [N39.41] 03/21/2015   Total Time spent with patient: 20 minutes  Past Psychiatric History: See H&P  Past Medical History:  Past Medical History:  Diagnosis Date  . Anxiety   . Apnea, sleep 02/02/2014  . Awareness of heartbeats 02/02/2014  . Breathlessness on exertion 02/02/2014  . Diabetes mellitus   . Encounter  for pre-employment examination 06/29/2015  . Excessive sweating 07/05/2015  . Fibromyalgia   . GERD (gastroesophageal reflux disease)   . Gravida 1 10/26/2015   1.    . Heart murmur   . Herniated disc   . Hyperlipidemia   . Hypertension   . Itch of skin 10/26/2015  . Lack of bladder control   . Lung tumor   . Rheumatoid arthritis ([place])   . Schizoaffective disorder ([place])   . Screening for cervical cancer 07/29/2017  . Seizure [facility])    after brain surgery 2012. last seizure 2013!  [name] Sex counseling 10/26/2015  . Sleep apnea   . 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 Stiffness of both knees 06/22/2015    Past Surgical History:  Procedure Laterality Date  . BRAIN TUMOR EXCISION  2012   benign  . CARDIAC CATHETERIZATION  2008  . CESAREAN SECTION    . CYST REMOVAL HAND    . LUNG LOBECTOMY  1977   benign tumor  . 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 Heart disease Brother   . Depression Mother   . Heart attack Mother   . Stroke Mother   . Alcohol abuse Father   . Stroke Father   . Diabetes Sister   . Diabetes Sister   . Stomach cancer Sister   . Kidney disease Sister   . COPD Brother   .  Lung cancer Brother   . Diabetes Brother    Family Psychiatric  History: See H&P Social History:  Social History   Substance and Sexual Activity  Alcohol Use No  . Alcohol/week: 0.0 oz     Social History   Substance and Sexual Activity  Drug Use No    Social History   Socioeconomic History  . Marital status: Single    Spouse name: Not on file  . Number of children: Not on file  . Years of education: Not on file  . Highest education level: Not on file  Occupational History  . Not on file  Social Needs  . Financial resource strain: Not hard at all   . Food insecurity:    Worry: Never true    Inability: Never true  . Transportation needs:    Medical: No    Non-medical: No  Tobacco Use  . Smoking status: Never Smoker  . Smokeless tobacco: Never Used  Substance and Sexual Activity  . Alcohol use: No    Alcohol/week: 0.0 oz  . Drug use: No  . Sexual activity: Not Currently  Lifestyle  . Physical activity:    Days per week: 0 days    Minutes per session: 0 min  . Stress: Only a little  Relationships  . Social connections:    Talks on phone: Not on file    Gets together: Not on file    Attends religious service: More than 4 times per year    Active member of club or organization: No    Attends meetings of clubs or organizations: Never    Relationship status: Married  Other Topics Concern  . Not on file  Social History Narrative  . Not on file   Additional Social History:                         Sleep: Fair  Appetite:  Fair  Current Medications: Current Facility-Administered Medications  Medication Dose Route Frequency Provider Last Rate Last Dose  . acetaminophen (TYLENOL) tablet 650 mg  650 mg Oral Q6H PRN [name], [name], MD   650 mg at 12/31/17 1006  . alum & mag hydroxide-simeth (MAALOX/MYLANTA) 200-200-20 MG/5ML suspension 30 mL  30 mL Oral Q4H PRN [name], [name], MD      . aspirin EC tablet 81 mg  81 mg Oral Daily [name], [name], MD   81 mg at 01/01/18 0841  . atenolol (TENORMIN) tablet 100 mg  100 mg Oral Daily McNew, Tyson Babinski, MD   100 mg at 01/01/18 0840  . benztropine (COGENTIN) tablet 0.5 mg  0.5 mg Oral QHS [name], [name], MD   0.5 mg at 12/31/17 2041  . haloperidol (HALDOL) tablet 10 mg  10 mg Oral QHS [name], [name], MD   10 mg at 12/31/17 2041  . lisinopril (PRINIVIL,ZESTRIL) tablet 20 mg  20 mg Oral Daily [name], [name], MD   20 mg at 01/01/18 0841  . magnesium hydroxide (MILK OF MAGNESIA) suspension 30 mL  30 mL Oral Daily PRN [name], [name], MD      . metFORMIN (GLUCOPHAGE) tablet  500 mg  500 mg Oral BID WC [name], [name], MD   500 mg at 01/01/18 0841  . pantoprazole (PROTONIX) EC tablet 40 mg  40 mg Oral Daily [name], [name], MD   40 mg at 01/01/18 0841  . rosuvastatin (CRESTOR) tablet 40 mg  40 mg Oral q1800  [name], [name], MD   40 mg at 12/31/17 1700  . traZODone (DESYREL) tablet 100 mg  100 mg Oral QHS PRN Marylin Crosby, MD   100 mg at 12/31/17 2041    Lab Results:  Results for orders placed or performed during the hospital encounter of 12/29/17 (from the past 48 hour(s))  Glucose, capillary     Status: Abnormal   Collection Time: 12/31/17  6:10 AM  Result Value Ref Range   Glucose-Capillary 124 (H) 65 - 99 mg/dL  Glucose, capillary     Status: Abnormal   Collection Time: 12/31/17 11:23 AM  Result Value Ref Range   Glucose-Capillary 106 (H) 65 - 99 mg/dL  Glucose, capillary     Status: None   Collection Time: 12/31/17  4:38 PM  Result Value Ref Range   Glucose-Capillary 93 65 - 99 mg/dL  Glucose, capillary     Status: Abnormal   Collection Time: 12/31/17  8:39 PM  Result Value Ref Range   Glucose-Capillary 118 (H) 65 - 99 mg/dL  Glucose, capillary     Status: Abnormal   Collection Time: 01/01/18  6:32 AM  Result Value Ref Range   Glucose-Capillary 100 (H) 65 - 99 mg/dL  Glucose, capillary     Status: Abnormal   Collection Time: 01/01/18 11:23 AM  Result Value Ref Range   Glucose-Capillary 121 (H) 65 - 99 mg/dL   Comment 1 Notify RN     Blood Alcohol level:  Lab Results  Component Value Date   ETH <10 12/28/2017   ETH <10 78/46/9629    Metabolic Disorder Labs: Lab Results  Component Value Date   HGBA1C 6.4 (H) 12/30/2017   MPG 136.98 12/30/2017   MPG 131.24 11/05/2017   No results found for: PROLACTIN Lab Results  Component Value Date   CHOL 137 12/30/2017   TRIG 75 12/30/2017   HDL 49 12/30/2017   CHOLHDL 2.8 12/30/2017   VLDL 15 12/30/2017   LDLCALC 73 12/30/2017   LDLCALC 163 (H) 11/05/2017    Physical Findings: AIMS:  ,  ,  ,  ,    CIWA:    COWS:     Musculoskeletal: Strength & Muscle Tone: within normal limits Gait & Station: normal Patient leans: N/A  Psychiatric Specialty Exam: Physical Exam  Nursing note and vitals reviewed. No muscle stiffness or cogwheeling noted on exam  Review of Systems  All other systems reviewed and are negative.   Blood pressure (!) 172/94, pulse 75, temperature 98.4 F (36.9 C), temperature source Oral, resp. rate 18, height 5' 2.8" (1.595 m), weight 106.1 kg (234 lb), SpO2 94 %.Body mass index is 41.72 kg/m.  General Appearance: Casual  Eye Contact:  Good  Speech:  Clear and Coherent  Volume:  Normal  Mood:  Depressed  Affect:  Congruent  Thought Process:  Coherent and Goal Directed  Orientation:  Full (Time, Place, and Person)  Thought Content:  Logical and Paranoid Ideation  Suicidal Thoughts:  No  Homicidal Thoughts:  No  Memory:  Immediate;   Fair  Judgement:  Fair  Insight:  Fair  Psychomotor Activity:  Normal  Concentration:  Concentration: Poor  Recall:  [employer] of Knowledge:  Fair  Language:  Fair  Akathisia:  No      Assets:  Resilience  ADL's:  Intact, doing laundry today  Cognition:  Impaired,  Mild  Sleep:  Number of Hours: 7     Treatment Plan Summary: 67 yo female  admitted due to depression and paranoia. Pt still slightly paranoia but does have insight into this. She has been medication compliant and able to have very appropriate conversations about them. She is caring well for her ADLs.  Plan:  Schizophrenia -She was given additional 50 mg dose of haldol Dec yesterday for total monthly dose of 100 mg -Continue oral for now -Continue Cogentin 0.5 mg qhs  HTN -BP slightly better. 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 She will also be referred to [place] team for closer follow up in the community.      Marylin Crosby, MD 01/01/2018, 3:08 PM

## 2018-01-01 NOTE — Progress Notes (Signed)
D- Patient alert and oriented. Patient presents in a pleasant mood on assessment stating to this writer that "I didn't get a lot of rest" last night. Patient continues to endorse auditory hallucinations stating that "voice patterns taking me down life's highway, showing me my mistakes in past". Patient rates her depression a "5/10" stating that she isn't having any crying spells. Patient rates her anxiety a "3/10" stating she doesn't know why she's anxious. Patient also states that "my concentration is not back to normal, I don't have a clear head if I don't get a lot of sleep". Patient denies SI/HI/VH at this time. Patient's goal for today is "to get better so I can get out of here, I don't want to be coming back any time soon".  A- Scheduled medications administered to patient, per MD orders. Support and encouragement provided.  Routine safety checks conducted every 15 minutes.  Patient informed to notify staff with problems or concerns.  R- No adverse drug reactions noted. Patient contracts for safety at this time. Patient compliant with medications and treatment plan. Patient receptive, calm, and cooperative. Patient interacts well with others on the unit.  Patient remains safe at this time.

## 2018-01-01 NOTE — [facility] Group Notes (Signed)
[facility] Group Notes:  (Nursing/MHT/Case Management/Adjunct)  Date:  01/01/2018  Time:  9:41 PM  Type of Therapy:  Group Therapy  Participation Level:  Active  Participation Quality:  She said someone can in her room and took her clothes.  Affect:  Appropriate  Cognitive:  Alert  Insight:  Good  Engagement in Group:  Engaged  Modes of Intervention:  Support  Summary of Progress/Problems:  Lauren [name] 01/01/2018, 9:41 PM

## 2018-01-02 LAB — GLUCOSE, CAPILLARY: Glucose-Capillary: 99 mg/dL (ref 65–99)

## 2018-01-02 LAB — BASIC METABOLIC PANEL
Anion gap: 4 — ABNORMAL LOW (ref 5–15)
BUN: 13 mg/dL (ref 6–20)
CO2: 27 mmol/L (ref 22–32)
Calcium: 9.2 mg/dL (ref 8.9–10.3)
Chloride: 107 mmol/L (ref 101–111)
Creatinine, Ser: 0.81 mg/dL (ref 0.44–1.00)
GFR calc Af Amer: >60 mL/min (ref >60)
GFR calc non Af Amer: >60 mL/min (ref >60)
Glucose, Bld: 118 mg/dL — ABNORMAL HIGH (ref 65–99)
Potassium: 3.9 mmol/L (ref 3.5–5.1)
Sodium: 138 mmol/L (ref 135–145)

## 2018-01-02 MED ORDER — TRAZODONE HCL 50 MG PO TABS
50.0000 mg | ORAL_TABLET | Freq: Every evening | ORAL | Status: DC | PRN
  Administered 2018-01-03 – 2018-01-04 (×2): 50 mg via ORAL
  Filled 2018-01-02 (×2): qty 1

## 2018-01-02 MED ORDER — HYDROCHLOROTHIAZIDE 25 MG PO TABS
25.0000 mg | ORAL_TABLET | Freq: Every day | ORAL | Status: DC
  Administered 2018-01-03 – 2018-01-13 (×11): 25 mg via ORAL
  Filled 2018-01-02 (×11): qty 1

## 2018-01-02 MED ORDER — BENZTROPINE MESYLATE 1 MG PO TABS
1.0000 mg | ORAL_TABLET | Freq: Every day | ORAL | Status: DC
  Administered 2018-01-02 – 2018-01-12 (×11): 1 mg via ORAL
  Filled 2018-01-02 (×11): qty 1

## 2018-01-02 NOTE — Progress Notes (Signed)
Recreation Therapy Notes  Date: 01/02/2018  Time: 9:30 am  Location: Craft Room  Behavioral response: Appropriate  Intervention Topic: Emotions  Discussion/Intervention:  Group content on today was focused on emotions. The group identified what emotions are and why it is important to have emotions. Patients expressed some positive and negative emotions. Individuals gave some past experiences on how they normally dealt with emotions in the past. The group described some positive ways to deal with emotions in the future. Patients participated in the intervention "Name the Lauren [name]" where individuals were given a chance to experience different emotions.  Clinical Observations/Feedback:  Patient came to group and defined emotions as feelings. She stated the environment and the people you are around can affect your emotions. Individual explain that emotions can have you stressed. Patient participated in the intervention and was social with peers and staff during group.  Cordelro Gautreau LRT/CTRS         Lauren [name] 01/02/2018 10:58 AM

## 2018-01-02 NOTE — Progress Notes (Signed)
[facility] MD Progress Note  01/02/2018 2:22 PM Lauren [name]  MRN:  [id number]   Subjective:  Pt states that she is doing better each day. She talks about "my negative thought process." She states taht she has been thinking a lot about how she raised her son and wonders if there was more she could have done to make his life better. We processed through this together and she still has a good relationship with her son. We discussed that talking about this and getting therapy will be really helpful to change the negative thought processes. She denies hearing auditory hallucinations. She does still have some paranoia at times but overall improving. She is attending groups. She is very hesitant with any medications changes but is able to process through rationales well. She is sleeping fair.   Principal Problem: Schizoaffective disorder ([place]) Diagnosis:   Patient Active Problem List   Diagnosis Date Noted  . Schizophrenia ([place]) [F20.9] 11/04/2017    Priority: High  . Schizoaffective disorder ([place]) [F25.9] 12/29/2017  . Morbid obesity ([place]) [E66.01] 12/09/2017  . Meningioma ([place]) [D32.9] 11/25/2017  . Atherosclerosis of aorta ([place]) [I70.0] 11/25/2017  . Calcification of coronary artery [I25.10, I25.84] 11/25/2017  . Acid reflux [K21.9] 06/29/2015  . Diabetes mellitus type 2, controlled, without complications ([place]) [O37.8] 06/29/2015  . Cardiac murmur [R01.1] 06/29/2015  . Arthritis of knee, degenerative [M17.10] 06/28/2015  . Insomnia w/ sleep apnea [G47.00, G47.30] 03/21/2015  . Anxiety disorder [F41.9] 03/21/2015  . Hypertension [I10] 03/21/2015  . HLD (hyperlipidemia) [E78.5] 03/21/2015  . Urge incontinence [N39.41] 03/21/2015   Total Time spent with patient: 30 minutes  Past Psychiatric History: See H&P  Past Medical History:  Past Medical History:  Diagnosis Date  . Anxiety   . Apnea, sleep 02/02/2014  . Awareness of heartbeats 02/02/2014  . Breathlessness on exertion 02/02/2014   . Diabetes mellitus   . Encounter for pre-employment examination 06/29/2015  . Excessive sweating 07/05/2015  . Fibromyalgia   . GERD (gastroesophageal reflux disease)   . Gravida 1 10/26/2015   1.    . Heart murmur   . Herniated disc   . Hyperlipidemia   . Hypertension   . Itch of skin 10/26/2015  . Lack of bladder control   . Lung tumor   . Rheumatoid arthritis ([place])   . Schizoaffective disorder ([place])   . Screening for cervical cancer 07/29/2017  . Seizure [facility])    after brain surgery 2012. last seizure 2013!  [name] Sex counseling 10/26/2015  . Sleep apnea   . 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 Stiffness of both knees 06/22/2015    Past Surgical History:  Procedure Laterality Date  . BRAIN TUMOR EXCISION  2012   benign  . CARDIAC CATHETERIZATION  2008  . CESAREAN SECTION    . CYST REMOVAL HAND    . LUNG LOBECTOMY  1977   benign tumor  . 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 Heart disease Brother   . Depression Mother   . Heart attack Mother   . Stroke Mother   . Alcohol abuse Father   . Stroke Father   . Diabetes Sister   . Diabetes Sister   . Stomach cancer Sister   . Kidney disease Sister   .  COPD Brother   . Lung cancer Brother   . Diabetes Brother    Family Psychiatric  History: See H&P Social History:  Social History   Substance and Sexual Activity  Alcohol Use No  . Alcohol/week: 0.0 oz     Social History   Substance and Sexual Activity  Drug Use No    Social History   Socioeconomic History  . Marital status: Single    Spouse name: Not on file  . Number of children: Not on file  . Years of education: Not on file  . Highest education level: Not on file  Occupational History  . Not on file  Social Needs  .  Financial resource strain: Not hard at all  . Food insecurity:    Worry: Never true    Inability: Never true  . Transportation needs:    Medical: No    Non-medical: No  Tobacco Use  . Smoking status: Never Smoker  . Smokeless tobacco: Never Used  Substance and Sexual Activity  . Alcohol use: No    Alcohol/week: 0.0 oz  . Drug use: No  . Sexual activity: Not Currently  Lifestyle  . Physical activity:    Days per week: 0 days    Minutes per session: 0 min  . Stress: Only a little  Relationships  . Social connections:    Talks on phone: Not on file    Gets together: Not on file    Attends religious service: More than 4 times per year    Active member of club or organization: No    Attends meetings of clubs or organizations: Never    Relationship status: Married  Other Topics Concern  . Not on file  Social History Narrative  . Not on file   Additional Social History:                         Sleep: Fair  Appetite:  Fair  Current Medications: Current Facility-Administered Medications  Medication Dose Route Frequency Provider Last Rate Last Dose  . acetaminophen (TYLENOL) tablet 650 mg  650 mg Oral Q6H PRN [name], [name], MD   650 mg at 12/31/17 1006  . alum & mag hydroxide-simeth (MAALOX/MYLANTA) 200-200-20 MG/5ML suspension 30 mL  30 mL Oral Q4H PRN [name], [name], MD      . aspirin EC tablet 81 mg  81 mg Oral Daily [name], [name], MD   81 mg at 01/02/18 0904  . atenolol (TENORMIN) tablet 100 mg  100 mg Oral Daily Jamone Garrido, Tyson Babinski, MD   100 mg at 01/02/18 0904  . benztropine (COGENTIN) tablet 1 mg  1 mg Oral QHS Lynx Goodrich R, MD      . haloperidol (HALDOL) tablet 10 mg  10 mg Oral QHS [name], [name], MD   10 mg at 01/01/18 2115  . [START ON 01/03/2018] hydrochlorothiazide (HYDRODIURIL) tablet 25 mg  25 mg Oral Daily Manas Hickling R, MD      . lisinopril (PRINIVIL,ZESTRIL) tablet 20 mg  20 mg Oral Daily [name], [name], MD   20 mg at 01/02/18 0904  . magnesium  hydroxide (MILK OF MAGNESIA) suspension 30 mL  30 mL Oral Daily PRN [name], [name], MD      . metFORMIN (GLUCOPHAGE) tablet 500 mg  500 mg Oral BID WC [name], [name], MD   500 mg at 01/02/18 0904  . pantoprazole (PROTONIX) EC tablet 40 mg  40 mg  Oral Daily [name], [name], MD   40 mg at 01/02/18 0904  . rosuvastatin (CRESTOR) tablet 40 mg  40 mg Oral q1800 [name], [name], MD   40 mg at 01/01/18 1723  . traZODone (DESYREL) tablet 50 mg  50 mg Oral QHS PRN Tanairy Payeur, Tyson Babinski, MD        Lab Results:  Results for orders placed or performed during the hospital encounter of 12/29/17 (from the past 48 hour(s))  Glucose, capillary     Status: None   Collection Time: 12/31/17  4:38 PM  Result Value Ref Range   Glucose-Capillary 93 65 - 99 mg/dL  Glucose, capillary     Status: Abnormal   Collection Time: 12/31/17  8:39 PM  Result Value Ref Range   Glucose-Capillary 118 (H) 65 - 99 mg/dL  Glucose, capillary     Status: Abnormal   Collection Time: 01/01/18  6:32 AM  Result Value Ref Range   Glucose-Capillary 100 (H) 65 - 99 mg/dL  Glucose, capillary     Status: Abnormal   Collection Time: 01/01/18 11:23 AM  Result Value Ref Range   Glucose-Capillary 121 (H) 65 - 99 mg/dL   Comment 1 Notify RN   Glucose, capillary     Status: Abnormal   Collection Time: 01/01/18  4:29 PM  Result Value Ref Range   Glucose-Capillary 112 (H) 65 - 99 mg/dL   Comment 1 Notify RN   Glucose, capillary     Status: Abnormal   Collection Time: 01/01/18  9:06 PM  Result Value Ref Range   Glucose-Capillary 101 (H) 65 - 99 mg/dL  Glucose, capillary     Status: None   Collection Time: 01/02/18  6:27 AM  Result Value Ref Range   Glucose-Capillary 99 65 - 99 mg/dL  Basic metabolic panel     Status: Abnormal   Collection Time: 01/02/18  6:42 AM  Result Value Ref Range   Sodium 138 135 - 145 mmol/L   Potassium 3.9 3.5 - 5.1 mmol/L   Chloride 107 101 - 111 mmol/L   CO2 27 22 - 32 mmol/L   Glucose, Bld 118 (H) 65 - 99  mg/dL   BUN 13 6 - 20 mg/dL   Creatinine, Ser 0.81 0.44 - 1.00 mg/dL   Calcium 9.2 8.9 - 10.3 mg/dL   GFR calc non Af Amer >60 >60 mL/min   GFR calc Af Amer >60 >60 mL/min    Comment: (NOTE) The eGFR has been calculated using the CKD EPI equation. This calculation has not been validated in all clinical situations. eGFR's persistently <60 mL/min signify possible Chronic Kidney Disease.    Anion gap 4 (L) 5 - 15    Comment: Performed at [facility], [address] [place], [place] [address]    Blood Alcohol level:  Lab Results  Component Value Date   [facility] <10 12/28/2017   ETH <10 36/64/4034    Metabolic Disorder Labs: Lab Results  Component Value Date   HGBA1C 6.4 (H) 12/30/2017   MPG 136.98 12/30/2017   MPG 131.24 11/05/2017   No results found for: PROLACTIN Lab Results  Component Value Date   CHOL 137 12/30/2017   TRIG 75 12/30/2017   HDL 49 12/30/2017   CHOLHDL 2.8 12/30/2017   VLDL 15 12/30/2017   LDLCALC 73 12/30/2017   LDLCALC 163 (H) 11/05/2017    Physical Findings: AIMS:  , ,  ,  ,    CIWA:    COWS:  Musculoskeletal: Strength & Muscle Tone: within normal limits Gait & Station: normal Patient leans: N/A  Psychiatric Specialty Exam: Physical Exam  Nursing note and vitals reviewed. Slight LUE and LLE tremor noted on exam.  Review of Systems  Neurological: Positive for tremors.    Blood pressure 134/77, pulse 61, temperature 98.4 F (36.9 C), temperature source Oral, resp. rate 18, height 5' 2.8" (1.595 m), weight 106.1 kg (234 lb), SpO2 98 %.Body mass index is 41.72 kg/m.  General Appearance: Casual  Eye Contact:  Good  Speech:  Slow, stuttering  Volume:  Normal  Mood:  Depressed  Affect:  Constricted but brighter today and smiling  Thought Process:  Coherent and Goal Directed  Orientation:  Full (Time, Place, and Person)  Thought Content:  Logical and Paranoid Ideation  Suicidal Thoughts:  No  Homicidal Thoughts:  No  Memory:   Immediate;   Fair  Judgement:  Fair  Insight:  Lacking  Psychomotor Activity:  Normal  Concentration:  Attention Span: Poor  Recall:  [employer] of Knowledge:  Fair  Language:  Fair  Akathisia:  No      Assets:  Resilience  ADL's:  Intact  Cognition:  WNL  Sleep:  Number of Hours: 7     Treatment Plan Summary: 67 yo female admitted due to paranoia and depression. She is still paranoid but this is improving each day. She is able to have much more rational conversation today. She does have slight left sided tremor likely due to Haldol. She does not want to stop oral Haldol yet. Possibly stop oral on Monday.  Plan:  Schizophrenia -She is on Haldol decanoate 100 mg monthly -Continue oral Haldol as she does not want to discontinue yet   Tremor -Increase Cogentin to 1 mg qhs  HTN  BP better today -will restart HCTZ now that potassium is improved. This was a home med -Continue Lisinopril and Atenolol  Hypokalemia Vonzell Schlatter  DM -Continue Metformin 500 mg BID  Dispo -CSW is working hard on [employer] for housing. She will also be referred to [place] team for closer follow up in the community.     Marylin Crosby, MD 01/02/2018, 2:22 PM

## 2018-01-02 NOTE — Progress Notes (Signed)
Patient ID: Lauren [name], female   DOB: [birth year], 67 y.o.   MRN: [id number] CSW attempted to make contact with Thressa Sheller, TL with [name] in order to get status update on referral that was submitted to her.  CSW left Ms. Truman Hayward a VM with request to call CSW Dossie Arbour, LCSW on Monday with update.

## 2018-01-02 NOTE — Progress Notes (Signed)
Patient stayed in room, pleasant and cooperative but paranoid about medications and other interventions. Denied thoughts of self harm. Avoiding the dayroom. Presented to the medication room and received all her medications. Support and encouragements provided. Safety precautions maintained.

## 2018-01-02 NOTE — [facility] Group Notes (Signed)
01/02/2018 1PM  Type of Therapy and Topic:  Group Therapy:  Feelings around Relapse and Recovery  Participation Level:  Active   Description of Group:    Patients in this group will discuss emotions they experience before and after a relapse. They will process how experiencing these feelings, or avoidance of experiencing them, relates to having a relapse. Facilitator will guide patients to explore emotions they have related to recovery. Patients will be encouraged to process which emotions are more powerful. They will be guided to discuss the emotional reaction significant others in their lives may have to patients' relapse or recovery. Patients will be assisted in exploring ways to respond to the emotions of others without this contributing to a relapse.  Therapeutic Goals: 1. Patient will identify two or more emotions that lead to a relapse for them 2. Patient will identify two emotions that result when they relapse 3. Patient will identify two emotions related to recovery 4. Patient will demonstrate ability to communicate their needs through discussion and/or role plays   Summary of Patient Progress: Actively and appropriately engaged in the group. Patient was able to provide support and validation to other group members.Patient practiced active listening when interacting with the facilitator and other group members. Lauren [name] states "People want to change you instead of just accepting you for who you are." She mentioned her support system being God and her Aunt. She says that she can always go and talk to them. Patient in still in the process of obtaining treatment goals.      Therapeutic Modalities:   Cognitive Behavioral Therapy Solution-Focused Therapy Assertiveness Training Relapse Prevention Therapy   Darin Engels, Marlinda Mike 01/02/2018 2:48 PM

## 2018-01-02 NOTE — Plan of Care (Signed)
Affect sad.  Some thought blocking noted.  Difficulty verbalizing thoughts.  States "I feel like people are condemning me"  Further states that she was paranoid when she came.  Continues to exhibits some paranoia related to medications.  Medications have to be opened in front of patient.  Attending groups.  Up to dayroom for meals.  Good appetite.  Medication compliant.  Remains safe on the unit.  Support and encouragement offered.   Problem: Education: Goal: Knowledge of General Education information will improve Outcome: Progressing   Problem: Health Behavior/Discharge Planning: Goal: Ability to manage health-related needs will improve Outcome: Progressing   Problem: Activity: Goal: Risk for activity intolerance will decrease Outcome: Progressing   Problem: Nutrition: Goal: Adequate nutrition will be maintained Outcome: Progressing   Problem: Coping: Goal: Level of anxiety will decrease Outcome: Progressing   Problem: Elimination: Goal: Will not experience complications related to bowel motility Outcome: Progressing Goal: Will not experience complications related to urinary retention Outcome: Progressing   Problem: Education: Goal: Knowledge of the prescribed therapeutic regimen will improve Outcome: Progressing   Problem: Activity: Goal: Interest or engagement in leisure activities will improve Outcome: Progressing   Problem: Coping: Goal: Coping ability will improve Outcome: Progressing Goal: Will verbalize feelings Outcome: Progressing   Problem: Health Behavior/Discharge Planning: Goal: Compliance with therapeutic regimen will improve Outcome: Progressing   Problem: Role Relationship: Goal: Will demonstrate positive changes in social behaviors and relationships Outcome: Progressing   Problem: Safety: Goal: Ability to disclose and discuss suicidal ideas will improve Outcome: Progressing Goal: Ability to identify and utilize support systems that promote safety  will improve Outcome: Progressing   Problem: Self-Concept: Goal: Will verbalize positive feelings about self Outcome: Progressing Goal: Level of anxiety will decrease Outcome: Progressing   Problem: Education: Goal: Knowledge of  General Education information/materials will improve Outcome: Progressing Goal: Emotional status will improve Outcome: Progressing Goal: Mental status will improve Outcome: Progressing Goal: Verbalization of understanding the information provided will improve Outcome: Progressing   Problem: Activity: Goal: Interest or engagement in activities will improve Outcome: Progressing   Problem: Coping: Goal: Ability to verbalize frustrations and anger appropriately will improve Outcome: Progressing Goal: Ability to demonstrate self-control will improve Outcome: Progressing   Problem: Health Behavior/Discharge Planning: Goal: Identification of resources available to assist in meeting health care needs will improve Outcome: Progressing Goal: Compliance with treatment plan for underlying cause of condition will improve Outcome: Progressing   Problem: Physical Regulation: Goal: Ability to maintain clinical measurements within normal limits will improve Outcome: Progressing   Problem: Safety: Goal: Periods of time without injury will increase Outcome: Progressing

## 2018-01-02 NOTE — Plan of Care (Signed)
Remained in room, guarded but pleasant and cooperative. Compliant with medications

## 2018-01-03 LAB — GLUCOSE, CAPILLARY: Glucose-Capillary: 104 mg/dL — ABNORMAL HIGH (ref 65–99)

## 2018-01-03 NOTE — Progress Notes (Signed)
[facility] MD Progress Note  01/03/2018 4:29 PM Lauren [name]  MRN:  [id number]   Subjective:  Pt is sitting in the dayroom eating snack.  She states that her mood has improved.  Her depression is mild. She denies AVH, states that paranoia is "not much"  Slept okay.    Principal Problem: Schizoaffective disorder ([place]) Diagnosis:   Patient Active Problem List   Diagnosis Date Noted  . Schizoaffective disorder ([place]) [F25.9] 12/29/2017  . Morbid obesity ([place]) [E66.01] 12/09/2017  . Meningioma ([place]) [D32.9] 11/25/2017  . Atherosclerosis of aorta ([place]) [I70.0] 11/25/2017  . Calcification of coronary artery [I25.10, I25.84] 11/25/2017  . Schizophrenia ([place]) [F20.9] 11/04/2017  . Acid reflux [K21.9] 06/29/2015  . Diabetes mellitus type 2, controlled, without complications ([place]) [R44.3] 06/29/2015  . Cardiac murmur [R01.1] 06/29/2015  . Arthritis of knee, degenerative [M17.10] 06/28/2015  . Insomnia w/ sleep apnea [G47.00, G47.30] 03/21/2015  . Anxiety disorder [F41.9] 03/21/2015  . Hypertension [I10] 03/21/2015  . HLD (hyperlipidemia) [E78.5] 03/21/2015  . Urge incontinence [N39.41] 03/21/2015   Total Time spent with patient: 20 minutes  Past Psychiatric History: See H&P  Past Medical History:  Past Medical History:  Diagnosis Date  . Anxiety   . Apnea, sleep 02/02/2014  . Awareness of heartbeats 02/02/2014  . Breathlessness on exertion 02/02/2014  . Diabetes mellitus   . Encounter for pre-employment examination 06/29/2015  . Excessive sweating 07/05/2015  . Fibromyalgia   . GERD (gastroesophageal reflux disease)   . Gravida 1 10/26/2015   1.    . Heart murmur   . Herniated disc   . Hyperlipidemia   . Hypertension   . Itch of skin 10/26/2015  . Lack of bladder control   . Lung tumor   . Rheumatoid arthritis ([place])   . Schizoaffective disorder ([place])   . Screening for cervical cancer 07/29/2017  . Seizure [facility])    after brain surgery 2012. last seizure 2013!  [name] Sex  counseling 10/26/2015  . Sleep apnea   . 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 Stiffness of both knees 06/22/2015    Past Surgical History:  Procedure Laterality Date  . BRAIN TUMOR EXCISION  2012   benign  . CARDIAC CATHETERIZATION  2008  . CESAREAN SECTION    . CYST REMOVAL HAND    . LUNG LOBECTOMY  1977   benign tumor  . 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 Heart disease Brother   . Depression Mother   . Heart attack Mother   . Stroke Mother   . Alcohol abuse Father   . Stroke Father   . Diabetes Sister   . Diabetes Sister   . Stomach cancer Sister   . Kidney disease Sister   . COPD Brother   . Lung cancer Brother   . Diabetes Brother    Family Psychiatric  History: See H&P Social History:  Social History   Substance and Sexual Activity  Alcohol Use No  . Alcohol/week: 0.0 oz     Social History   Substance and Sexual Activity  Drug Use No    Social History   Socioeconomic History  . Marital status: Single    Spouse name: Not on file  . Number of children: Not on file  .  Years of education: Not on file  . Highest education level: Not on file  Occupational History  . Not on file  Social Needs  . Financial resource strain: Not hard at all  . Food insecurity:    Worry: Never true    Inability: Never true  . Transportation needs:    Medical: No    Non-medical: No  Tobacco Use  . Smoking status: Never Smoker  . Smokeless tobacco: Never Used  Substance and Sexual Activity  . Alcohol use: No    Alcohol/week: 0.0 oz  . Drug use: No  . Sexual activity: Not Currently  Lifestyle  . Physical activity:    Days per week: 0 days    Minutes per session: 0 min  . Stress: Only a little  Relationships  . Social connections:     Talks on phone: Not on file    Gets together: Not on file    Attends religious service: More than 4 times per year    Active member of club or organization: No    Attends meetings of clubs or organizations: Never    Relationship status: Married  Other Topics Concern  . Not on file  Social History Narrative  . Not on file   Additional Social History:                         Sleep: Fair  Appetite:  Fair  Current Medications: Current Facility-Administered Medications  Medication Dose Route Frequency Provider Last Rate Last Dose  . acetaminophen (TYLENOL) tablet 650 mg  650 mg Oral Q6H PRN [name], [name], MD   650 mg at 12/31/17 1006  . alum & mag hydroxide-simeth (MAALOX/MYLANTA) 200-200-20 MG/5ML suspension 30 mL  30 mL Oral Q4H PRN [name], [name], MD      . aspirin EC tablet 81 mg  81 mg Oral Daily [name], [name], MD   81 mg at 01/03/18 0842  . atenolol (TENORMIN) tablet 100 mg  100 mg Oral Daily McNew, Tyson Babinski, MD   100 mg at 01/03/18 0841  . benztropine (COGENTIN) tablet 1 mg  1 mg Oral QHS McNew, Holly R, MD   1 mg at 01/02/18 2148  . haloperidol (HALDOL) tablet 10 mg  10 mg Oral QHS [name], [name], MD   10 mg at 01/02/18 2147  . hydrochlorothiazide (HYDRODIURIL) tablet 25 mg  25 mg Oral Daily McNew, Tyson Babinski, MD   25 mg at 01/03/18 0841  . lisinopril (PRINIVIL,ZESTRIL) tablet 20 mg  20 mg Oral Daily [name], [name], MD   20 mg at 01/03/18 0843  . magnesium hydroxide (MILK OF MAGNESIA) suspension 30 mL  30 mL Oral Daily PRN [name], [name], MD      . metFORMIN (GLUCOPHAGE) tablet 500 mg  500 mg Oral BID WC [name], [name], MD   500 mg at 01/03/18 0843  . pantoprazole (PROTONIX) EC tablet 40 mg  40 mg Oral Daily [name], [name], MD   40 mg at 01/03/18 0842  . rosuvastatin (CRESTOR) tablet 40 mg  40 mg Oral q1800 [name], [name], MD   40 mg at 01/02/18 1824  . traZODone (DESYREL) tablet 50 mg  50 mg Oral QHS PRN McNew, Tyson Babinski, MD        Lab Results:  Results for  orders placed or performed during the hospital encounter of 12/29/17 (from the past 48 hour(s))  Glucose, capillary  Status: Abnormal   Collection Time: 01/01/18  9:06 PM  Result Value Ref Range   Glucose-Capillary 101 (H) 65 - 99 mg/dL  Glucose, capillary     Status: None   Collection Time: 01/02/18  6:27 AM  Result Value Ref Range   Glucose-Capillary 99 65 - 99 mg/dL  Basic metabolic panel     Status: Abnormal   Collection Time: 01/02/18  6:42 AM  Result Value Ref Range   Sodium 138 135 - 145 mmol/L   Potassium 3.9 3.5 - 5.1 mmol/L   Chloride 107 101 - 111 mmol/L   CO2 27 22 - 32 mmol/L   Glucose, Bld 118 (H) 65 - 99 mg/dL   BUN 13 6 - 20 mg/dL   Creatinine, Ser 0.81 0.44 - 1.00 mg/dL   Calcium 9.2 8.9 - 10.3 mg/dL   GFR calc non Af Amer >60 >60 mL/min   GFR calc Af Amer >60 >60 mL/min    Comment: (NOTE) The eGFR has been calculated using the CKD EPI equation. This calculation has not been validated in all clinical situations. eGFR's persistently <60 mL/min signify possible Chronic Kidney Disease.    Anion gap 4 (L) 5 - 15    Comment: Performed at [facility], [address] [place], [place] [address]  Glucose, capillary     Status: Abnormal   Collection Time: 01/03/18  8:05 AM  Result Value Ref Range   Glucose-Capillary 104 (H) 65 - 99 mg/dL    Blood Alcohol level:  Lab Results  Component Value Date   ETH <10 12/28/2017   ETH <10 71/02/2693    Metabolic Disorder Labs: Lab Results  Component Value Date   HGBA1C 6.4 (H) 12/30/2017   MPG 136.98 12/30/2017   MPG 131.24 11/05/2017   No results found for: PROLACTIN Lab Results  Component Value Date   CHOL 137 12/30/2017   TRIG 75 12/30/2017   HDL 49 12/30/2017   CHOLHDL 2.8 12/30/2017   VLDL 15 12/30/2017   LDLCALC 73 12/30/2017   LDLCALC 163 (H) 11/05/2017    Physical Findings: AIMS:  , ,  ,  ,    CIWA:    COWS:     Musculoskeletal: Strength & Muscle Tone: within normal limits Gait &  Station: normal Patient leans: N/A  Psychiatric Specialty Exam: Physical Exam  Nursing note and vitals reviewed. Slight LUE and LLE tremor   Review of Systems  Neurological: Positive for tremors.    Blood pressure (!) 176/96, pulse 63, temperature 98.8 F (37.1 C), temperature source Oral, resp. rate 20, height 5' 2.8" (1.595 m), weight 106.1 kg (234 lb), SpO2 96 %.Body mass index is 41.72 kg/m.  General Appearance: Casual  Eye Contact:  Good  Speech:  Slow,   Volume:  Normal  Mood:  calm  Affect:  Congruent   Thought Process:  Coherent and Goal Directed  Orientation:  Full (Time, Place, and Person)  Thought Content:  Logical and Paranoid Ideation  Suicidal Thoughts:  No  Homicidal Thoughts:  No  Memory:  Immediate;   Fair  Judgement:  Fair  Insight:  Lacking  Psychomotor Activity:  Normal  Concentration:  Attention Span: Poor  Recall:  [employer] of Knowledge:  Fair  Language:  Fair  Akathisia:  No      Assets:  Resilience  ADL's:  Intact  Cognition:  WNL  Sleep:  Number of Hours: 7     Treatment Plan Summary: 67 yo female admitted due  to paranoia and depression. She is still paranoid but this is improving each day. She is able to have much more rational conversation today. She does have slight left sided tremor likely due to Haldol. She does not want to stop oral Haldol yet. Possibly stop oral on Monday.  Plan:  Schizophrenia -She is on Haldol decanoate 100 mg monthly -Continue oral Haldol as she does not want to discontinue yet   Tremor -Cogentin to 1 mg qhs  HTN -HCTZ restarted Saturday morning.  Will give a few more days of hctz before adjusting dose  -Continue Lisinopril and Atenolol  Hypokalemia -resolved  DM -Continue Metformin 500 mg BID  Dispo -CSW is working hard on [employer] for housing. She will also be referred to [place] team for closer follow up in the community.    Jolene Schimke, MD 01/03/2018, 4:29  PM

## 2018-01-03 NOTE — Plan of Care (Signed)
  Problem: Education: Goal: Knowledge of General Education information will improve Outcome: Progressing   Problem: Health Behavior/Discharge Planning: Goal: Ability to manage health-related needs will improve Outcome: Progressing   Problem: Coping: Goal: Level of anxiety will decrease Outcome: Progressing   Problem: Pain Managment: Goal: General experience of comfort will improve Outcome: Progressing   Problem: Safety: Goal: Ability to remain free from injury will improve Outcome: Progressing   Problem: Skin Integrity: Goal: Risk for impaired skin integrity will decrease Outcome: Progressing   Problem: Education: Goal: Utilization of techniques to improve thought processes will improve Outcome: Progressing Goal: Knowledge of the prescribed therapeutic regimen will improve Outcome: Progressing   Problem: Activity: Goal: Interest or engagement in leisure activities will improve Outcome: Progressing Goal: Imbalance in normal sleep/wake cycle will improve Outcome: Progressing   Problem: Coping: Goal: Coping ability will improve Outcome: Progressing Goal: Will verbalize feelings Outcome: Progressing   Problem: Health Behavior/Discharge Planning: Goal: Ability to make decisions will improve Outcome: Progressing Goal: Compliance with therapeutic regimen will improve Outcome: Progressing   Problem: Role Relationship: Goal: Will demonstrate positive changes in social behaviors and relationships Outcome: Progressing   Problem: Safety: Goal: Ability to disclose and discuss suicidal ideas will improve Outcome: Progressing Goal: Ability to identify and utilize support systems that promote safety will improve Outcome: Progressing   Problem: Self-Concept: Goal: Will verbalize positive feelings about self Outcome: Progressing Goal: Level of anxiety will decrease Outcome: Progressing   Problem: Education: Goal: Knowledge of [place] General Education  information/materials will improve Outcome: Progressing Goal: Emotional status will improve Outcome: Progressing Goal: Mental status will improve Outcome: Progressing Goal: Verbalization of understanding the information provided will improve Outcome: Progressing   Problem: Activity: Goal: Interest or engagement in activities will improve Outcome: Progressing Goal: Sleeping patterns will improve Outcome: Progressing   Problem: Coping: Goal: Ability to verbalize frustrations and anger appropriately will improve Outcome: Progressing Goal: Ability to demonstrate self-control will improve Outcome: Progressing   Problem: Health Behavior/Discharge Planning: Goal: Identification of resources available to assist in meeting health care needs will improve Outcome: Progressing Goal: Compliance with treatment plan for underlying cause of condition will improve Outcome: Progressing   Problem: Physical Regulation: Goal: Ability to maintain clinical measurements within normal limits will improve Outcome: Progressing   Problem: Safety: Goal: Periods of time without injury will increase Outcome: Progressing

## 2018-01-03 NOTE — B[facility] Group Notes (Signed)
LCSW Group Therapy Note  01/03/2018 1:15pm  Type of Therapy and Topic:  Group Therapy:  Cognitive Distortions  Participation Level:  Active   Description of Group:    Patients in this group will be introduced to the topic of cognitive distortions.  Patients will identify and describe cognitive distortions, describe the feelings these distortions create for them.  Patients will identify one or more situations in their personal life where they have cognitively distorted thinking and will verbalize challenging this cognitive distortion through positive thinking skills.  Patients will practice the skill of using positive affirmations to challenge cognitive distortions using affirmation cards.    Therapeutic Goals:  1. Patient will identify two or more cognitive distortions they have used 2. Patient will identify one or more emotions that stem from use of a cognitive distortion 3. Patient will demonstrate use of a positive affirmation to counter a cognitive distortion through discussion and/or role play. 4. Patient will describe one way cognitive distortions can be detrimental to wellness   Summary of Patient Progress: The patient reported she feels "well." Patients were introduced to the topic of cognitive distortions.  Patient was able to identify and describe cognitive distortions. She described the feelings these distortions create for her.  Patient identified situations in her personal life where she has cognitively distorted thinking and verbalized challenging these cognitive distortions through positive thinking skills.  Patients practiced the skill of using positive affirmations to challenge cognitive distortions using affirmation cards. Patient actively engaged in group discussion.        Therapeutic Modalities:   Cognitive Behavioral Therapy Motivational Interviewing   Daemien Fronczak  [name], LCSW 01/03/2018 11:37 AM

## 2018-01-03 NOTE — Plan of Care (Signed)
Affect sad.  Avoids eye contact.  Isolates to room reading bible.  No interaction with peers.  Up to dayroom for meals.  Attends groups.  Continues to exhibit paranoid symptoms concerning medications although takes her medications after carefull inspecting the package before allowing this writer to open them.   Verbalizes that she always feels as though people are out to condemn her .  Support and encouragement offered.  Safety maintained.

## 2018-01-03 NOTE — Plan of Care (Signed)
Patient is calm and cooperative with her therapeutic regimen, working on coping skills, encourage patient to attend unit scheduled groups, to improve her emotional and mental status, currently unable to identify positive attribute of self, no safety concerns, no social involvement. Denies any SI/HI and no signs of AVH, reminds patient of 15 minute safety rounding, sleep long hours no distress noted. Problem: Education: Goal: Knowledge of General Education information will improve Outcome: Progressing   Problem: Health Behavior/Discharge Planning: Goal: Ability to manage health-related needs will improve Outcome: Progressing   Problem: Coping: Goal: Level of anxiety will decrease Outcome: Progressing   Problem: Pain Managment: Goal: General experience of comfort will improve Outcome: Progressing   Problem: Safety: Goal: Ability to remain free from injury will improve Outcome: Progressing   Problem: Skin Integrity: Goal: Risk for impaired skin integrity will decrease Outcome: Progressing   Problem: Activity: Goal: Interest or engagement in leisure activities will improve Outcome: Progressing   Problem: Coping: Goal: Coping ability will improve Outcome: Progressing   Problem: Health Behavior/Discharge Planning: Goal: Ability to make decisions will improve Outcome: Progressing Goal: Compliance with therapeutic regimen will improve Outcome: Progressing   Problem: Safety: Goal: Ability to disclose and discuss suicidal ideas will improve Outcome: Progressing   Problem: Self-Concept: Goal: Will verbalize positive feelings about self Outcome: Progressing   Problem: Safety: Goal: Periods of time without injury will increase Outcome: Progressing

## 2018-01-04 MED ORDER — DICLOFENAC SODIUM 1 % TD GEL
4.0000 g | Freq: Four times a day (QID) | TRANSDERMAL | Status: DC | PRN
  Administered 2018-01-04: 4 g via TOPICAL
  Filled 2018-01-04: qty 100

## 2018-01-04 NOTE — Plan of Care (Signed)
Patient verbalizes understanding of the general information that's been provided to her and has not voiced any further questions or concerns at this time. Patient has the ability to manage health-related needs and has been working towards maintaining her clinical measurements within normal limits. Patient has remained free from infection and her diagnostic tests have improved. Patient has been free from any health-related complications thus far. Patient's overall level of comfort has improved and she has remained free from injury on the unit. Patient's risk for impaired skin integrity has decreased and she stated to this writer that she slept "ok" last night. Patient has the ability to cope and has been attending/participating in unit groups when appropriate without any issues. Patient has the ability to make decisions for herself and has been in compliance with her prescribed therapeutic/medication regimen without any further questions/concerns. Patient denies SI/HI/AVH at this time, however, she stated to this writer when she does hear the voices she was "hearing condemning things, but not now". Patient states that her goal for today is "trying not to be so negative and to trust more in God than in people because some people are not worthy of your trust".  Patient rates her depression level a "5/10", and when this writer asked her what was making her feel this way she stated "I don't know, I had a crying spell yesterday". Patient has been demonstrating self-control on the unit and remains safe at this time.   Problem: Education: Goal: Knowledge of General Education information will improve Outcome: Progressing   Problem: Health Behavior/Discharge Planning: Goal: Ability to manage health-related needs will improve Outcome: Progressing   Problem: Coping: Goal: Level of anxiety will decrease Outcome: Progressing   Problem: Pain Managment: Goal: General experience of comfort will improve Outcome:  Progressing   Problem: Safety: Goal: Ability to remain free from injury will improve Outcome: Progressing   Problem: Skin Integrity: Goal: Risk for impaired skin integrity will decrease Outcome: Progressing   Problem: Education: Goal: Utilization of techniques to improve thought processes will improve Outcome: Progressing Goal: Knowledge of the prescribed therapeutic regimen will improve Outcome: Progressing   Problem: Activity: Goal: Interest or engagement in leisure activities will improve Outcome: Progressing Goal: Imbalance in normal sleep/wake cycle will improve Outcome: Progressing   Problem: Coping: Goal: Coping ability will improve Outcome: Progressing Goal: Will verbalize feelings Outcome: Progressing   Problem: Health Behavior/Discharge Planning: Goal: Ability to make decisions will improve Outcome: Progressing Goal: Compliance with therapeutic regimen will improve Outcome: Progressing   Problem: Role Relationship: Goal: Will demonstrate positive changes in social behaviors and relationships Outcome: Progressing   Problem: Safety: Goal: Ability to disclose and discuss suicidal ideas will improve Outcome: Progressing Goal: Ability to identify and utilize support systems that promote safety will improve Outcome: Progressing   Problem: Self-Concept: Goal: Will verbalize positive feelings about self Outcome: Progressing Goal: Level of anxiety will decrease Outcome: Progressing   Problem: Education: Goal: Knowledge of [place] General Education information/materials will improve Outcome: Progressing Goal: Emotional status will improve Outcome: Progressing Goal: Mental status will improve Outcome: Progressing Goal: Verbalization of understanding the information provided will improve Outcome: Progressing   Problem: Activity: Goal: Interest or engagement in activities will improve Outcome: Progressing Goal: Sleeping patterns will improve Outcome:  Progressing   Problem: Coping: Goal: Ability to verbalize frustrations and anger appropriately will improve Outcome: Progressing Goal: Ability to demonstrate self-control will improve Outcome: Progressing   Problem: Health Behavior/Discharge Planning: Goal: Identification of resources available to assist in meeting  health care needs will improve Outcome: Progressing Goal: Compliance with treatment plan for underlying cause of condition will improve Outcome: Progressing   Problem: Physical Regulation: Goal: Ability to maintain clinical measurements within normal limits will improve Outcome: Progressing   Problem: Safety: Goal: Periods of time without injury will increase Outcome: Progressing

## 2018-01-04 NOTE — Plan of Care (Addendum)
Patient found in bed sleeping upon my arrival. Patient is neither visible nor social this evening. Patient affect is brighter and more animated. Patient is smiling and appropriate. Denies SI/HI/AVH. Depression and anxiety is improved. Continues to report decreased ability to concentrate. Remains isolative and religiously preoccupied. Reports eating and voiding adequately. Compliant with HS medications and staff direction. Q 15 minute checks maintained. Will continue to monitor throughout the shift.  Patient slept 7.75 hours. No apparent distress. Reports sleeping well. Compliant with VS and CBG. VSS. CBG 97. Will endorse care to oncoming shift.  Problem: Education: Goal: Knowledge of General Education information will improve Outcome: Progressing   Problem: Coping: Goal: Level of anxiety will decrease Outcome: Progressing   Problem: Education: Goal: Knowledge of the prescribed therapeutic regimen will improve Outcome: Progressing   Problem: Activity: Goal: Interest or engagement in leisure activities will improve Outcome: Progressing Goal: Imbalance in normal sleep/wake cycle will improve Outcome: Progressing   Problem: Coping: Goal: Will verbalize feelings Outcome: Progressing   Problem: Self-Concept: Goal: Level of anxiety will decrease Outcome: Progressing   Problem: Education: Goal: Emotional status will improve Outcome: Progressing Goal: Mental status will improve Outcome: Progressing   Problem: Coping: Goal: Ability to demonstrate self-control will improve Outcome: Progressing

## 2018-01-04 NOTE — Progress Notes (Addendum)
[facility] MD Progress Note  01/04/2018 5:10 PM Lauren [name]  MRN:  [id number]   Subjective:  Pt is sitting in the dayroom watching tv.  States that her mood is good.  Requests for pain cream for her knees.  Otherwise denies depression, no avh but some paranoia.  Sleep is okay.    Principal Problem: Schizoaffective disorder ([place]) Diagnosis:   Patient Active Problem List   Diagnosis Date Noted  . Schizoaffective disorder ([place]) [F25.9] 12/29/2017  . Morbid obesity ([place]) [E66.01] 12/09/2017  . Meningioma ([place]) [D32.9] 11/25/2017  . Atherosclerosis of aorta ([place]) [I70.0] 11/25/2017  . Calcification of coronary artery [I25.10, I25.84] 11/25/2017  . Schizophrenia ([place]) [F20.9] 11/04/2017  . Acid reflux [K21.9] 06/29/2015  . Diabetes mellitus type 2, controlled, without complications ([place]) [J09.3] 06/29/2015  . Cardiac murmur [R01.1] 06/29/2015  . Arthritis of knee, degenerative [M17.10] 06/28/2015  . Insomnia w/ sleep apnea [G47.00, G47.30] 03/21/2015  . Anxiety disorder [F41.9] 03/21/2015  . Hypertension [I10] 03/21/2015  . HLD (hyperlipidemia) [E78.5] 03/21/2015  . Urge incontinence [N39.41] 03/21/2015   Total Time spent with patient: 20 minutes  Past Psychiatric History: See H&P  Past Medical History:  Past Medical History:  Diagnosis Date  . Anxiety   . Apnea, sleep 02/02/2014  . Awareness of heartbeats 02/02/2014  . Breathlessness on exertion 02/02/2014  . Diabetes mellitus   . Encounter for pre-employment examination 06/29/2015  . Excessive sweating 07/05/2015  . Fibromyalgia   . GERD (gastroesophageal reflux disease)   . Gravida 1 10/26/2015   1.    . Heart murmur   . Herniated disc   . Hyperlipidemia   . Hypertension   . Itch of skin 10/26/2015  . Lack of bladder control   . Lung tumor   . Rheumatoid arthritis ([place])   . Schizoaffective disorder ([place])   . Screening for cervical cancer 07/29/2017  . Seizure [facility])    after brain surgery 2012. last seizure 2013!   [name] Sex counseling 10/26/2015  . Sleep apnea   . 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 Stiffness of both knees 06/22/2015    Past Surgical History:  Procedure Laterality Date  . BRAIN TUMOR EXCISION  2012   benign  . CARDIAC CATHETERIZATION  2008  . CESAREAN SECTION    . CYST REMOVAL HAND    . LUNG LOBECTOMY  1977   benign tumor  . 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 Heart disease Brother   . Depression Mother   . Heart attack Mother   . Stroke Mother   . Alcohol abuse Father   . Stroke Father   . Diabetes Sister   . Diabetes Sister   . Stomach cancer Sister   . Kidney disease Sister   . COPD Brother   . Lung cancer Brother   . Diabetes Brother    Family Psychiatric  History: See H&P Social History:  Social History   Substance and Sexual Activity  Alcohol Use No  . Alcohol/week: 0.0 oz     Social History   Substance and Sexual Activity  Drug Use No    Social History   Socioeconomic History  . Marital status: Single    Spouse name: Not on file  . Number of children: Not on  file  . Years of education: Not on file  . Highest education level: Not on file  Occupational History  . Not on file  Social Needs  . Financial resource strain: Not hard at all  . Food insecurity:    Worry: Never true    Inability: Never true  . Transportation needs:    Medical: No    Non-medical: No  Tobacco Use  . Smoking status: Never Smoker  . Smokeless tobacco: Never Used  Substance and Sexual Activity  . Alcohol use: No    Alcohol/week: 0.0 oz  . Drug use: No  . Sexual activity: Not Currently  Lifestyle  . Physical activity:    Days per week: 0 days    Minutes per session: 0 min  . Stress: Only a little  Relationships  . Social connections:     Talks on phone: Not on file    Gets together: Not on file    Attends religious service: More than 4 times per year    Active member of club or organization: No    Attends meetings of clubs or organizations: Never    Relationship status: Married  Other Topics Concern  . Not on file  Social History Narrative  . Not on file   Additional Social History:                         Sleep: Fair  Appetite:  Fair  Current Medications: Current Facility-Administered Medications  Medication Dose Route Frequency Provider Last Rate Last Dose  . acetaminophen (TYLENOL) tablet 650 mg  650 mg Oral Q6H PRN [name], [name], MD   650 mg at 12/31/17 1006  . alum & mag hydroxide-simeth (MAALOX/MYLANTA) 200-200-20 MG/5ML suspension 30 mL  30 mL Oral Q4H PRN [name], [name], MD      . aspirin EC tablet 81 mg  81 mg Oral Daily [name], [name], MD   81 mg at 01/04/18 0802  . atenolol (TENORMIN) tablet 100 mg  100 mg Oral Daily McNew, Tyson Babinski, MD   100 mg at 01/04/18 0802  . benztropine (COGENTIN) tablet 1 mg  1 mg Oral QHS McNew, Tyson Babinski, MD   1 mg at 01/03/18 2157  . diclofenac sodium (VOLTAREN) 1 % transdermal gel 4 g  4 g Topical QID PRN Jolene Schimke, MD   4 g at 01/04/18 1506  . haloperidol (HALDOL) tablet 10 mg  10 mg Oral QHS [name], [name], MD   10 mg at 01/03/18 2157  . hydrochlorothiazide (HYDRODIURIL) tablet 25 mg  25 mg Oral Daily McNew, Tyson Babinski, MD   25 mg at 01/04/18 0802  . lisinopril (PRINIVIL,ZESTRIL) tablet 20 mg  20 mg Oral Daily [name], [name], MD   20 mg at 01/04/18 0802  . magnesium hydroxide (MILK OF MAGNESIA) suspension 30 mL  30 mL Oral Daily PRN [name], [name], MD      . metFORMIN (GLUCOPHAGE) tablet 500 mg  500 mg Oral BID WC [name], [name], MD   500 mg at 01/04/18 1700  . pantoprazole (PROTONIX) EC tablet 40 mg  40 mg Oral Daily [name], [name], MD   40 mg at 01/04/18 0802  . rosuvastatin (CRESTOR) tablet 40 mg  40 mg Oral q1800 [name], [name], MD   40 mg at  01/04/18 1700  . traZODone (DESYREL) tablet 50 mg  50 mg Oral QHS PRN McNew, Tyson Babinski, MD   50  mg at 01/03/18 2157    Lab Results:  Results for orders placed or performed during the hospital encounter of 12/29/17 (from the past 48 hour(s))  Glucose, capillary     Status: Abnormal   Collection Time: 01/03/18  8:05 AM  Result Value Ref Range   Glucose-Capillary 104 (H) 65 - 99 mg/dL    Blood Alcohol level:  Lab Results  Component Value Date   ETH <10 12/28/2017   ETH <10 42/35/3614    Metabolic Disorder Labs: Lab Results  Component Value Date   HGBA1C 6.4 (H) 12/30/2017   MPG 136.98 12/30/2017   MPG 131.24 11/05/2017   No results found for: PROLACTIN Lab Results  Component Value Date   CHOL 137 12/30/2017   TRIG 75 12/30/2017   HDL 49 12/30/2017   CHOLHDL 2.8 12/30/2017   VLDL 15 12/30/2017   LDLCALC 73 12/30/2017   LDLCALC 163 (H) 11/05/2017    Physical Findings: AIMS:  , ,  ,  ,    CIWA:    COWS:     Musculoskeletal: Strength & Muscle Tone: within normal limits Gait & Station: normal Patient leans: N/A  Psychiatric Specialty Exam: Physical Exam  Nursing note and vitals reviewed. Psychiatric: Judgment normal.  Slight LUE and LLE tremor   Review of Systems  Neurological: Positive for tremors.    Blood pressure (!) 123/57, pulse (!) 52, temperature 98 F (36.7 C), temperature source Oral, resp. rate (!) 21, height 5' 2.8" (1.595 m), weight 106.1 kg (234 lb), SpO2 100 %.Body mass index is 41.72 kg/m.  General Appearance: Casual  Eye Contact:  Good  Speech:  Slow,   Volume:  Normal  Mood:  Euphoric  Affect:  Congruent   Thought Process:  Coherent and Goal Directed  Orientation:  Full (Time, Place, and Person)  Thought Content:  Logical and Paranoid Ideation  Suicidal Thoughts:  No  Homicidal Thoughts:  No  Memory:  Immediate;   Fair  Judgement:  Fair  Insight:  Lacking  Psychomotor Activity:  Normal  Concentration:  Attention Span: Poor  Recall:   [employer] of Knowledge:  Fair  Language:  Fair  Akathisia:  No      Assets:  Resilience  ADL's:  Intact  Cognition:  WNL  Sleep:  Number of Hours: 5.45     Treatment Plan Summary: 67 yo female admitted due to paranoia and depression. She is still paranoid but this is improving each day. She is able to have much more rational conversation today. She does have slight left sided tremor likely due to Haldol. She does not want to stop oral Haldol yet. Possibly stop oral on Monday.  Plan:  Schizophrenia -She is on Haldol decanoate 100 mg monthly -Continue oral Haldol as she does not want to discontinue yet  Tremor -Cogentin to 1 mg qhs  HTN -HCTZ restarted Saturday morning.  Will give a few more days of hctz before adjusting dose  -Continue Lisinopril and Atenolol  Hypokalemia -resolved  -knee pain -add voltaren gel prn knee pain   DM -Continue Metformin 500 mg BID  Dispo -CSW is working hard on [employer] for housing. She will also be referred to [place] team for closer follow up in the community.    Jolene Schimke, MD 01/04/2018, 5:10 PM

## 2018-01-04 NOTE — Progress Notes (Signed)
D- Patient alert and oriented. Patient presents in a depressed/pleasant mood on assessment stating that she slept ok last night. Patient continues to endorse chronic, bilateral knee pain that she rates a "7/10". Patient rates her depression a "5/10" stating to this writer "I don't know why I'm, depressed, I had a crying spell yesterday". Patient denies SI, HI, AVH, as well as any signs/symptoms of anxiety at this time. Patient's goal for today is to "try not to be so negative and to trust more in God than in people".   A- Scheduled medications administered to patient, per MD orders. Support and encouragement provided.  Routine safety checks conducted every 15 minutes.  Patient informed to notify staff with problems or concerns.  R- No adverse drug reactions noted. Patient contracts for safety at this time. Patient compliant with medications and treatment plan. Patient receptive, calm, and cooperative. Patient interacts well with others on the unit.  Patient remains safe at this time.

## 2018-01-04 NOTE — [facility] Group Notes (Signed)
LCSW Group Therapy Note 01/04/2018 1:15pm  Type of Therapy and Topic: Group Therapy: Feelings Around Returning Home & Establishing a Supportive Framework and Supporting Oneself When Supports Not Available  Participation Level: Active  Description of Group:  Patients first processed thoughts and feelings about upcoming discharge. These included fears of upcoming changes, lack of change, new living environments, judgements and expectations from others and overall stigma of mental health issues. The group then discussed the definition of a supportive framework, what that looks and feels like, and how do to discern it from an unhealthy non-supportive network. The group identified different types of supports as well as what to do when your family/friends are less than helpful or unavailable  Therapeutic Goals  1. Patient will identify one healthy supportive network that they can use at discharge. 2. Patient will identify one factor of a supportive framework and how to tell it from an unhealthy network. 3. Patient able to identify one coping skill to use when they do not have positive supports from others. 4. Patient will demonstrate ability to communicate their needs through discussion and/or role plays.  Summary of Patient Progress:  The patient stated she feels "okay today." Pt engaged during group session. As patients processed their anxiety about discharge and described healthy supports patient shared she is not ready to be discharge. Pt reported " I don't have paranoia, I know people are against me." Pt identified her sister as her main support system.  Patients identified at least one self-care tool they were willing to use after discharge; read the bible.   Therapeutic Modalities Cognitive Behavioral Therapy Motivational Interviewing   Lauren [name]  [name], LCSW 01/04/2018 12:12 PM

## 2018-01-05 LAB — GLUCOSE, CAPILLARY: Glucose-Capillary: 97 mg/dL (ref 65–99)

## 2018-01-05 MED ORDER — TRAZODONE HCL 50 MG PO TABS
50.0000 mg | ORAL_TABLET | Freq: Every day | ORAL | Status: DC
  Administered 2018-01-05 – 2018-01-12 (×8): 50 mg via ORAL
  Filled 2018-01-05 (×8): qty 1

## 2018-01-05 MED ORDER — HALOPERIDOL 5 MG PO TABS
5.0000 mg | ORAL_TABLET | Freq: Every day | ORAL | Status: DC
  Administered 2018-01-05 – 2018-01-12 (×8): 5 mg via ORAL
  Filled 2018-01-05 (×8): qty 1

## 2018-01-05 NOTE — Progress Notes (Signed)
D- Patient alert and oriented. Patient presents in a pleasant mood on assessment stating that she slept "pretty good" last night. Patient has no major complaints, however, she continues to endorse a pain level of "7/10" in her knees. Patient denies any signs/symptoms of depression/anxiety. Patient also denies SI, HI, AVH, at this time. Patient states that the voices aren't bothering her right now, but when they are "they're inside". Patient's goal for today is "the same as yesterday, be positive and have a positive outlook on life".  A- Scheduled medications administered to patient, per MD orders. Support and encouragement provided.  Routine safety checks conducted every 15 minutes.  Patient informed to notify staff with problems or concerns.  R- No adverse drug reactions noted. Patient contracts for safety at this time. Patient compliant with medications and treatment plan. Patient receptive, calm, and cooperative. Patient interacts well with others on the unit.  Patient remains safe at this time.

## 2018-01-05 NOTE — Progress Notes (Signed)
[facility] MD Progress Note  01/05/2018 2:07 PM Lauren [name]  MRN:  [id number] Subjective:  Pt states that she continues to improve each day. She still reports feeling overall suspicious and paranoid but this is improving. Her speech is clearer and less stuttering. Her affect is brighter and smiling. She is attending groups. We discussed her medications in detail. She has a slight left sided resting tremor. We discussed tapering her off oral Haldol. She is very hesitant but willing to decrease the dose. She would also like trazodone scheduled instead of prn. She was able to have a very appropriate conversation about her medications.   Principal Problem: Schizoaffective disorder ([place]) Diagnosis:   Patient Active Problem List   Diagnosis Date Noted  . Schizophrenia ([place]) [F20.9] 11/04/2017    Priority: High  . Schizoaffective disorder ([place]) [F25.9] 12/29/2017  . Morbid obesity ([place]) [E66.01] 12/09/2017  . Meningioma ([place]) [D32.9] 11/25/2017  . Atherosclerosis of aorta ([place]) [I70.0] 11/25/2017  . Calcification of coronary artery [I25.10, I25.84] 11/25/2017  . Acid reflux [K21.9] 06/29/2015  . Diabetes mellitus type 2, controlled, without complications ([place]) [N82.9] 06/29/2015  . Cardiac murmur [R01.1] 06/29/2015  . Arthritis of knee, degenerative [M17.10] 06/28/2015  . Insomnia w/ sleep apnea [G47.00, G47.30] 03/21/2015  . Anxiety disorder [F41.9] 03/21/2015  . Hypertension [I10] 03/21/2015  . HLD (hyperlipidemia) [E78.5] 03/21/2015  . Urge incontinence [N39.41] 03/21/2015   Total Time spent with patient: 20 minutes  Past Psychiatric History: See H&P  Past Medical History:  Past Medical History:  Diagnosis Date  . Anxiety   . Apnea, sleep 02/02/2014  . Awareness of heartbeats 02/02/2014  . Breathlessness on exertion 02/02/2014  . Diabetes mellitus   . Encounter for pre-employment examination 06/29/2015  . Excessive sweating 07/05/2015  . Fibromyalgia   . GERD (gastroesophageal  reflux disease)   . Gravida 1 10/26/2015   1.    . Heart murmur   . Herniated disc   . Hyperlipidemia   . Hypertension   . Itch of skin 10/26/2015  . Lack of bladder control   . Lung tumor   . Rheumatoid arthritis ([place])   . Schizoaffective disorder ([place])   . Screening for cervical cancer 07/29/2017  . Seizure [facility])    after brain surgery 2012. last seizure 2013!  [name] Sex counseling 10/26/2015  . Sleep apnea   . 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 Stiffness of both knees 06/22/2015    Past Surgical History:  Procedure Laterality Date  . BRAIN TUMOR EXCISION  2012   benign  . CARDIAC CATHETERIZATION  2008  . CESAREAN SECTION    . CYST REMOVAL HAND    . LUNG LOBECTOMY  1977   benign tumor  . 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 Heart disease Brother   . Depression Mother   . Heart attack Mother   . Stroke Mother   . Alcohol abuse Father   . Stroke Father   . Diabetes Sister   . Diabetes Sister   . Stomach cancer Sister   . Kidney disease Sister   . COPD Brother   . Lung cancer Brother   . Diabetes Brother    Family Psychiatric  History: See H&P Social History:  Social History   Substance and  Sexual Activity  Alcohol Use No  . Alcohol/week: 0.0 oz     Social History   Substance and Sexual Activity  Drug Use No    Social History   Socioeconomic History  . Marital status: Single    Spouse name: Not on file  . Number of children: Not on file  . Years of education: Not on file  . Highest education level: Not on file  Occupational History  . Not on file  Social Needs  . Financial resource strain: Not hard at all  . Food insecurity:    Worry: Never true    Inability: Never true  . Transportation needs:    Medical: No     Non-medical: No  Tobacco Use  . Smoking status: Never Smoker  . Smokeless tobacco: Never Used  Substance and Sexual Activity  . Alcohol use: No    Alcohol/week: 0.0 oz  . Drug use: No  . Sexual activity: Not Currently  Lifestyle  . Physical activity:    Days per week: 0 days    Minutes per session: 0 min  . Stress: Only a little  Relationships  . Social connections:    Talks on phone: Not on file    Gets together: Not on file    Attends religious service: More than 4 times per year    Active member of club or organization: No    Attends meetings of clubs or organizations: Never    Relationship status: Married  Other Topics Concern  . Not on file  Social History Narrative  . Not on file   Additional Social History:                         Sleep: Good  Appetite:  Good  Current Medications: Current Facility-Administered Medications  Medication Dose Route Frequency Provider Last Rate Last Dose  . acetaminophen (TYLENOL) tablet 650 mg  650 mg Oral Q6H PRN [name], [name], MD   650 mg at 12/31/17 1006  . alum & mag hydroxide-simeth (MAALOX/MYLANTA) 200-200-20 MG/5ML suspension 30 mL  30 mL Oral Q4H PRN [name], [name], MD      . aspirin EC tablet 81 mg  81 mg Oral Daily [name], [name], MD   81 mg at 01/05/18 0829  . benztropine (COGENTIN) tablet 1 mg  1 mg Oral QHS Tyrisha Benninger, Tyson Babinski, MD   1 mg at 01/04/18 2114  . diclofenac sodium (VOLTAREN) 1 % transdermal gel 4 g  4 g Topical QID PRN Jolene Schimke, MD   4 g at 01/04/18 1506  . haloperidol (HALDOL) tablet 5 mg  5 mg Oral QHS Takeyah Wieman R, MD      . hydrochlorothiazide (HYDRODIURIL) tablet 25 mg  25 mg Oral Daily Braxxton Stoudt, Tyson Babinski, MD   25 mg at 01/05/18 0829  . lisinopril (PRINIVIL,ZESTRIL) tablet 20 mg  20 mg Oral Daily [name], [name], MD   20 mg at 01/05/18 0829  . magnesium hydroxide (MILK OF MAGNESIA) suspension 30 mL  30 mL Oral Daily PRN [name], [name], MD      . metFORMIN (GLUCOPHAGE) tablet 500 mg  500 mg  Oral BID WC [name], [name], MD   500 mg at 01/05/18 0829  . pantoprazole (PROTONIX) EC tablet 40 mg  40 mg Oral Daily [name], [name], MD   40 mg at 01/05/18 0829  . rosuvastatin (CRESTOR) tablet 40 mg  40 mg Oral q1800 [name],  [name], MD   40 mg at 01/04/18 1700  . traZODone (DESYREL) tablet 50 mg  50 mg Oral QHS Charod Slawinski, Tyson Babinski, MD        Lab Results:  Results for orders placed or performed during the hospital encounter of 12/29/17 (from the past 48 hour(s))  Glucose, capillary     Status: None   Collection Time: 01/05/18  6:13 AM  Result Value Ref Range   Glucose-Capillary 97 65 - 99 mg/dL    Blood Alcohol level:  Lab Results  Component Value Date   ETH <10 12/28/2017   ETH <10 25/63/8937    Metabolic Disorder Labs: Lab Results  Component Value Date   HGBA1C 6.4 (H) 12/30/2017   MPG 136.98 12/30/2017   MPG 131.24 11/05/2017   No results found for: PROLACTIN Lab Results  Component Value Date   CHOL 137 12/30/2017   TRIG 75 12/30/2017   HDL 49 12/30/2017   CHOLHDL 2.8 12/30/2017   VLDL 15 12/30/2017   LDLCALC 73 12/30/2017   LDLCALC 163 (H) 11/05/2017    Physical Findings: AIMS:  , ,  ,  ,    CIWA:    COWS:     Musculoskeletal: Strength & Muscle Tone: within normal limits Gait & Station: normal Patient leans: N/A  Psychiatric Specialty Exam: Physical Exam  Nursing note and vitals reviewed.   Review of Systems  All other systems reviewed and are negative.   Blood pressure 114/65, pulse (!) 58, temperature 99.1 F (37.3 C), temperature source Oral, resp. rate 18, height 5' 2.8" (1.595 m), weight 106.1 kg (234 lb), SpO2 96 %.Body mass index is 41.72 kg/m.  General Appearance: Casual  Eye Contact:  Good  Speech:  Clear and Coherent  Volume:  Normal  Mood:  Depressed but improving  Affect:  Appropriate  Thought Process:  Coherent and Goal Directed  Orientation:  Full (Time, Place, and Person)  Thought Content:  Paranoid Ideation  Suicidal Thoughts:   No  Homicidal Thoughts:  No  Memory:  Immediate;   Fair  Judgement:  Fair  Insight:  Fair  Psychomotor Activity:  Normal  Concentration:  Concentration: Fair improving  Recall:  [employer] of Knowledge:  Fair  Language:  Fair  Akathisia:  No      Assets:  Resilience  ADL's:  Intact  Cognition:  WNL  Sleep:  Number of Hours: 7.75     Treatment Plan Summary: 67 yo female admitted due to paranoia and depression. She is now on 100 mg of Haldol decanoate. She agrees to start tapering off oral Haldol. She has slight left sided tremor. She is still paranoid but this is overall improving. Speech is much clearer.   Plan:  Schizophrenia -She received haldol decanoate 100 mg. Next dose due 01/27/18 -Decrease oral haldol to 5 mg qhs -Continue Cogentin 1 mg qhs -Will change trazodone to scheduled dosing per her request  HTN -BP improving, she is bradycardic today will stop atenolol -Continue HCTZ and Lisinopril  DM _continue Metformin 500 mg BID  Dispo -CSW working on [employer]. She will be referred to ACT team  Marylin Crosby, MD 01/05/2018, 2:07 PM

## 2018-01-05 NOTE — Plan of Care (Signed)
Patient verbalizes understanding of the general information that's been provided to her and has not voiced any further questions or concerns at this time. Patient has the ability to manage health-related needs and has been working towards maintaining her clinical measurements within normal limits. Patient has remained free from infection and her diagnostic tests have improved. Patient has been free from any health-related complications thus far. Patient's overall level of comfort has improved and she has remained free from injury on the unit. Patient's risk for impaired skin integrity has decreased and she stated to this writer that she slept "ok" last night. Patient has the ability to cope and has been attending/participating in unit groups when appropriate without any issues. Patient has the ability to make decisions for herself and has been in compliance with her prescribed therapeutic/medication regimen without any further questions/concerns. Patient denies SI/HI/AVH at this time, however, she stated to this writer when she does hear the voices "it's inside". Patient states that her goal for today is the "same as yesterday" , which was, "trying not to be so negative and to trust more in God than in people because some people are not worthy of your trust".  Patient also denies any signs/symptoms of depression and anxiety. Patient has been demonstrating self-control on the unit and remains safe at this time.   Problem: Education: Goal: Knowledge of General Education information will improve Outcome: Progressing   Problem: Health Behavior/Discharge Planning: Goal: Ability to manage health-related needs will improve Outcome: Progressing   Problem: Coping: Goal: Level of anxiety will decrease Outcome: Progressing   Problem: Pain Managment: Goal: General experience of comfort will improve Outcome: Progressing   Problem: Safety: Goal: Ability to remain free from injury will improve Outcome:  Progressing   Problem: Skin Integrity: Goal: Risk for impaired skin integrity will decrease Outcome: Progressing   Problem: Education: Goal: Utilization of techniques to improve thought processes will improve Outcome: Progressing Goal: Knowledge of the prescribed therapeutic regimen will improve Outcome: Progressing   Problem: Activity: Goal: Interest or engagement in leisure activities will improve Outcome: Progressing Goal: Imbalance in normal sleep/wake cycle will improve Outcome: Progressing   Problem: Coping: Goal: Coping ability will improve Outcome: Progressing Goal: Will verbalize feelings Outcome: Progressing   Problem: Health Behavior/Discharge Planning: Goal: Ability to make decisions will improve Outcome: Progressing Goal: Compliance with therapeutic regimen will improve Outcome: Progressing   Problem: Role Relationship: Goal: Will demonstrate positive changes in social behaviors and relationships Outcome: Progressing   Problem: Safety: Goal: Ability to disclose and discuss suicidal ideas will improve Outcome: Progressing Goal: Ability to identify and utilize support systems that promote safety will improve Outcome: Progressing   Problem: Self-Concept: Goal: Will verbalize positive feelings about self Outcome: Progressing Goal: Level of anxiety will decrease Outcome: Progressing   Problem: Education: Goal: Knowledge of [place] General Education information/materials will improve Outcome: Progressing Goal: Emotional status will improve Outcome: Progressing Goal: Mental status will improve Outcome: Progressing Goal: Verbalization of understanding the information provided will improve Outcome: Progressing   Problem: Activity: Goal: Interest or engagement in activities will improve Outcome: Progressing Goal: Sleeping patterns will improve Outcome: Progressing   Problem: Coping: Goal: Ability to verbalize frustrations and anger appropriately  will improve Outcome: Progressing Goal: Ability to demonstrate self-control will improve Outcome: Progressing   Problem: Health Behavior/Discharge Planning: Goal: Identification of resources available to assist in meeting health care needs will improve Outcome: Progressing Goal: Compliance with treatment plan for underlying cause of condition will improve Outcome: Progressing  Problem: Physical Regulation: Goal: Ability to maintain clinical measurements within normal limits will improve Outcome: Progressing   Problem: Safety: Goal: Periods of time without injury will increase Outcome: Progressing   

## 2018-01-05 NOTE — Tx Team (Signed)
Interdisciplinary Treatment and Diagnostic Plan Update  01/05/2018 Time of Session: 11:15am Lauren [name] MRN: [id number]  Principal Diagnosis: Schizoaffective disorder [facility])  Secondary Diagnoses: Principal Problem:   Schizoaffective disorder ([place]) Active Problems:   Hypertension   Acid reflux   Diabetes mellitus type 2, controlled, without complications ([place])   Current Medications:  Current Facility-Administered Medications  Medication Dose Route Frequency Provider Last Rate Last Dose  . acetaminophen (TYLENOL) tablet 650 mg  650 mg Oral Q6H PRN [name], [name], MD   650 mg at 12/31/17 1006  . alum & mag hydroxide-simeth (MAALOX/MYLANTA) 200-200-20 MG/5ML suspension 30 mL  30 mL Oral Q4H PRN [name], [name], MD      . aspirin EC tablet 81 mg  81 mg Oral Daily [name], [name], MD   81 mg at 01/05/18 0829  . benztropine (COGENTIN) tablet 1 mg  1 mg Oral QHS McNew, Tyson Babinski, MD   1 mg at 01/04/18 2114  . diclofenac sodium (VOLTAREN) 1 % transdermal gel 4 g  4 g Topical QID PRN Jolene Schimke, MD   4 g at 01/04/18 1506  . haloperidol (HALDOL) tablet 5 mg  5 mg Oral QHS McNew, Holly R, MD      . hydrochlorothiazide (HYDRODIURIL) tablet 25 mg  25 mg Oral Daily McNew, Tyson Babinski, MD   25 mg at 01/05/18 0829  . lisinopril (PRINIVIL,ZESTRIL) tablet 20 mg  20 mg Oral Daily [name], [name], MD   20 mg at 01/05/18 0829  . magnesium hydroxide (MILK OF MAGNESIA) suspension 30 mL  30 mL Oral Daily PRN [name], [name], MD      . metFORMIN (GLUCOPHAGE) tablet 500 mg  500 mg Oral BID WC [name], [name], MD   500 mg at 01/05/18 0829  . pantoprazole (PROTONIX) EC tablet 40 mg  40 mg Oral Daily [name], [name], MD   40 mg at 01/05/18 0829  . rosuvastatin (CRESTOR) tablet 40 mg  40 mg Oral q1800 [name], [name], MD   40 mg at 01/04/18 1700  . traZODone (DESYREL) tablet 50 mg  50 mg Oral QHS McNew, Tyson Babinski, MD       PTA Medications: Medications Prior to Admission  Medication Sig Dispense Refill  Last Dose  . aspirin EC 81 MG tablet Take 81 mg by mouth daily before breakfast.    12/28/2017 at Unknown time  . benztropine (COGENTIN) 0.5 MG tablet Take 1 tablet (0.5 mg total) by mouth at bedtime. 60 tablet 0 12/28/2017 at Unknown time  . Bioflavonoid Products (VITAMIN C) CHEW Chew 1 tablet by mouth daily as needed (for immune system support).   12/28/2017 at Unknown time  . Capsaicin (ZOSTRIX EX) Apply 1 application topically 4 (four) times daily as needed (for knee pain.).   12/28/2017 at Unknown time  . haloperidol (HALDOL) 10 MG tablet Take 1 tablet (10 mg total) by mouth at bedtime. 30 tablet 0 12/28/2017 at Unknown time  . haloperidol decanoate (HALDOL DECANOATE) 100 MG/ML injection Inject 0.5 mLs (50 mg total) into the muscle every 28 (twenty-eight) days. Next injection due on 12/02/17 1 mL 0 12/28/2017 at Unknown time  . ibuprofen (ADVIL,MOTRIN) 200 MG tablet Take 600 mg by mouth every 8 (eight) hours as needed (for knee pain.).   12/28/2017 at Unknown time  . lisinopril (PRINIVIL,ZESTRIL) 20 MG tablet Take 1 tablet (20 mg total) by mouth daily. 30 tablet 0 12/28/2017 at Unknown time  . Plum  1 application topically 4 (four) times daily as needed (for knee pain.). [employer] Essential Lotion (Aloe Vera, [employer], and 6 natural anti-inflammatory essential oils; [place], [place], [place], [place], Peppermint and [place])   12/28/2017 at Unknown time  . metFORMIN (GLUCOPHAGE) 500 MG tablet Take 1 tablet (500 mg total) by mouth 2 (two) times daily with a meal. 60 tablet 0 12/28/2017 at Unknown time  . omeprazole (PRILOSEC) 20 MG capsule TAKE 1 CAPSULE BY MOUTH  DAILY 90 capsule 0 12/28/2017 at Unknown time  . rosuvastatin (CRESTOR) 40 MG tablet Take 1 tablet (40 mg total) by mouth daily. 30 tablet 0 12/28/2017 at Unknown time  . traZODone (DESYREL) 100 MG tablet Take 100 mg by mouth at bedtime as needed for sleep.   12/28/2017 at Unknown time    Patient  Stressors: Financial difficulties Loss of suppoottt system  Patient Strengths: Ability for insight Capable of independent living General fund of knowledge  Treatment Modalities: Medication Management, Group therapy, Case management,  1 to 1 session with clinician, Psychoeducation, Recreational therapy.   Physician Treatment Plan for Primary Diagnosis: Schizoaffective disorder ([place]) Long Term Goal(s): Improvement in symptoms so as ready for discharge   Short Term Goals: Ability to demonstrate self-control will improve  Medication Management: Evaluate patient's response, side effects, and tolerance of medication regimen.  Therapeutic Interventions: 1 to 1 sessions, Unit Group sessions and Medication administration.  Evaluation of Outcomes: Progressing  Physician Treatment Plan for Secondary Diagnosis: Principal Problem:   Schizoaffective disorder ([place]) Active Problems:   Hypertension   Acid reflux   Diabetes mellitus type 2, controlled, without complications ([place])  Long Term Goal(s): Improvement in symptoms so as ready for discharge   Short Term Goals: Ability to demonstrate self-control will improve     Medication Management: Evaluate patient's response, side effects, and tolerance of medication regimen.  Therapeutic Interventions: 1 to 1 sessions, Unit Group sessions and Medication administration.  Evaluation of Outcomes: Progressing   RN Treatment Plan for Primary Diagnosis: Schizoaffective disorder ([place]) Long Term Goal(s): Knowledge of disease and therapeutic regimen to maintain health will improve  Short Term Goals: Ability to identify and develop effective coping behaviors will improve and Compliance with prescribed medications will improve  Medication Management: RN will administer medications as ordered by provider, will assess and evaluate patient's response and provide education to patient for prescribed medication. RN will report any adverse and/or side effects  to prescribing provider.  Therapeutic Interventions: 1 on 1 counseling sessions, Psychoeducation, Medication administration, Evaluate responses to treatment, Monitor vital signs and CBGs as ordered, Perform/monitor CIWA, COWS, AIMS and Fall Risk screenings as ordered, Perform wound care treatments as ordered.  Evaluation of Outcomes: Progressing   LCSW Treatment Plan for Primary Diagnosis: Schizoaffective disorder ([place]) Long Term Goal(s): Safe transition to appropriate next level of care at discharge, Engage patient in therapeutic group addressing interpersonal concerns.  Short Term Goals: Engage patient in aftercare planning with referrals and resources, Identify triggers associated with mental health/substance abuse issues and Increase skills for wellness and recovery  Therapeutic Interventions: Assess for all discharge needs, 1 to 1 time with Social worker, Explore available resources and support systems, Assess for adequacy in community support network, Educate family and significant other(s) on suicide prevention, Complete Psychosocial Assessment, Interpersonal group therapy.  Evaluation of Outcomes: Progressing   Progress in Treatment: Attending groups: Yes. Participating in groups: Yes. Taking medication as prescribed: Yes. Toleration medication: Yes. Family/Significant other contact made: No, will contact:  Patient understands diagnosis: Yes. Discussing patient identified problems/goals with staff: Yes. Medical problems stabilized or resolved: Yes. Denies suicidal/homicidal ideation: Yes. Issues/concerns per patient self-inventory: Yes. Other:    New problem(s) identified: Yes, Describe:     New Short Term/Long Term Goal(s):to be able to focus, have better attention. To improve depression so she can continue working toward a place to live.  Discharge Plan or Barriers: CSW just learned housing application was approved for an apartment in [place]. Will need to coordinate  details of move in. She has been referred to ACTT team for follow up, They will see her tomorrow to assess for appropriateness.  Reason for Continuation of Hospitalization: Anxiety Depression Hallucinations Medication stabilization Other; describe Coordination of Aftercare  Estimated Length of Stay: 3-5 days  Recreational Therapy: Patient Stressors: Friends, living situation  Patient Goal: Patient will identify 3 positive coping skills to decrease depressive symptoms within 5 recreation therapy group sessions  Attendees: Patient:Lauren [name] 01/05/2018 4:34 PM  Physician: Amador Cunas, MD 01/05/2018 4:34 PM  Nursing:Shatara Florene Glen, RN 01/05/2018 4:34 PM  RN Care Manager: 01/05/2018 4:34 PM  Social Worker: Dossie Arbour, LCSW 01/05/2018 4:34 PM  Recreational Therapist: Roanna Epley, LRT 01/05/2018 4:34 PM  Other: Alden Hipp, [place] 01/05/2018 4:34 PM  Other:  01/05/2018 4:34 PM  Other: 01/05/2018 4:34 PM    Scribe for Treatment Team: [name], LCSW 01/05/2018 4:34 PM

## 2018-01-06 LAB — GLUCOSE, CAPILLARY
Glucose-Capillary: 159 mg/dL — ABNORMAL HIGH (ref 65–99)
Glucose-Capillary: 97 mg/dL (ref 65–99)

## 2018-01-06 NOTE — B[facility] Group Notes (Signed)
01/06/2018 1PM  Type of Therapy/Topic:  Group Therapy:  Feelings about Diagnosis  Participation Level:  Active   Description of Group:   This group will allow patients to explore their thoughts and feelings about diagnoses they have received. Patients will be guided to explore their level of understanding and acceptance of these diagnoses. Facilitator will encourage patients to process their thoughts and feelings about the reactions of others to their diagnosis and will guide patients in identifying ways to discuss their diagnosis with significant others in their lives. This group will be process-oriented, with patients participating in exploration of their own experiences, giving and receiving support, and processing challenge from other group members.   Therapeutic Goals: 1. Patient will demonstrate understanding of diagnosis as evidenced by identifying two or more symptoms of the disorder 2. Patient will be able to express two feelings regarding the diagnosis 3. Patient will demonstrate their ability to communicate their needs through discussion and/or role play  Summary of Patient Progress: Actively and appropriately engaged in the group. Patient was able to provide support and validation to other group members.Patient practiced active listening when interacting with the facilitator and other group members. Lauren [name] spoke about her paranoia and how it led her to become socially isolated. She reports accepting her mental health concerns. She also reports that she is going to continue to use prayer and meditation to help her manage her mental health.  Patient in still in the process of obtaining treatment goals.        Therapeutic Modalities:   Cognitive Behavioral Therapy Brief Therapy Feelings Identification    Darin Engels, LCSW 01/06/2018 1:58 PM

## 2018-01-06 NOTE — Progress Notes (Signed)
D- Patient alert and oriented. Patient presents in a pleasant mood on assessment stating that she slept "pretty well" last night. Patient denies SI, HI, AVH, at this time. Patient also denies any signs and symptoms of depression/anxiety  stating "it's not so much depression as it is those condemning thoughts". Patient's goals for today is to "try to be positive".  A- Scheduled medications administered to patient, per MD orders. Support and encouragement provided.  Routine safety checks conducted every 15 minutes.  Patient informed to notify staff with problems or concerns.  R- No adverse drug reactions noted. Patient contracts for safety at this time. Patient compliant with medications and treatment plan. Patient receptive, calm, and cooperative. Patient interacts well with others on the unit.  Patient remains safe at this time.

## 2018-01-06 NOTE — Progress Notes (Signed)
Recreation Therapy Notes   Date: 01/06/2018  Time: 9:30 am   Location: Craft Room   Behavioral response: N/A   Intervention Topic: Communication  Discussion/Intervention: Patient did not attend group.   Clinical Observations/Feedback:  Patient did not attend group.   Edelmiro Innocent LRT/CTRS         Willoughby Doell 01/06/2018 11:40 AM

## 2018-01-06 NOTE — B[facility] Group Notes (Signed)
CSW Group Therapy Note  01/06/2018  Time:  0900  Type of Therapy and Topic: Group Therapy: Goals Group: SMART Goals    Participation Level:  Active    Description of Group:   The purpose of a daily goals group is to assist and guide patients in setting recovery/wellness-related goals. The objective is to set goals as they relate to the crisis in which they were admitted. Patients will be using SMART goal modalities to set measurable goals. Characteristics of realistic goals will be discussed and patients will be assisted in setting and processing how one will reach their goal. Facilitator will also assist patients in applying interventions and coping skills learned in psycho-education groups to the SMART goal and process how one will achieve defined goal.    Therapeutic Goals:  -Patients will develop and document one goal related to or their crisis in which brought them into treatment.  -Patients will be guided by LCSW using SMART goal setting modality in how to set a measurable, attainable, realistic and time sensitive goal.  -Patients will process barriers in reaching goal.  -Patients will process interventions in how to overcome and successful in reaching goal.    Patient's Goal:  Pt continues to work towards their tx goals but has not yet reached them. Pt was able to appropriately participate in group discussion, and was able to offer support/validation to other group members. Pt reported feeling, "better because I'm on the road to discovering the right medications for me." Pt reported her goal for the day is to, "not get upset when talking with my family members, by using at least one coping skill, by the end of today."    Therapeutic Modalities:  Motivational Interviewing  Cognitive Behavioral Therapy  Crisis Intervention Model  SMART goals setting  Alden Hipp, MSW, LCSW Clinical Social Worker 01/06/2018 9:47 AM

## 2018-01-06 NOTE — Progress Notes (Signed)
Patient ID: Lauren [name], female   DOB: 06-01-1951, 67 y.o.   MRN: [id number] [name] with [facility] came to assess Pt and agreed to call and let us know if she would accept her or not.  ACTT team prefers that she be discharged on Monday so they will be able to assist her better during the week. They agreed to seek out [name] at [facility] to see if they would be willing to have her stay until move in date was arranged.  CSW also spoke with [name], Secondary school teacher to see when she would be able to meet Pt so she could see the property. She cannot schedule anything on Mondays but will schedule for Tuesday evenings.  She set an appointment for Tuesday at [place], LCSW

## 2018-01-06 NOTE — Plan of Care (Signed)
Patient is calm and cooperative to her medication regimen, safety rounding is in progress distress  Problem: Education: Goal: Knowledge of General Education information will improve Outcome: Progressing   Problem: Health Behavior/Discharge Planning: Goal: Ability to manage health-related needs will improve Outcome: Progressing   Problem: Coping: Goal: Level of anxiety will decrease Outcome: Progressing   Problem: Pain Managment: Goal: General experience of comfort will improve Outcome: Progressing   Problem: Safety: Goal: Ability to remain free from injury will improve Outcome: Progressing   Problem: Skin Integrity: Goal: Risk for impaired skin integrity will decrease Outcome: Progressing   Problem: Education: Goal: Utilization of techniques to improve thought processes will improve Outcome: Progressing Goal: Knowledge of the prescribed therapeutic regimen will improve Outcome: Progressing   Problem: Activity: Goal: Interest or engagement in leisure activities will improve Outcome: Progressing Goal: Imbalance in normal sleep/wake cycle will improve Outcome: Progressing   Problem: Coping: Goal: Coping ability will improve Outcome: Progressing Goal: Will verbalize feelings Outcome: Progressing   Problem: Health Behavior/Discharge Planning: Goal: Ability to make decisions will improve Outcome: Progressing Goal: Compliance with therapeutic regimen will improve Outcome: Progressing   Problem: Role Relationship: Goal: Will demonstrate positive changes in social behaviors and relationships Outcome: Progressing   Problem: Safety: Goal: Ability to disclose and discuss suicidal ideas will improve Outcome: Progressing Goal: Ability to identify and utilize support systems that promote safety will improve Outcome: Progressing   Problem: Self-Concept: Goal: Will verbalize positive feelings about self Outcome: Progressing Goal: Level of anxiety will decrease Outcome:  Progressing   Problem: Education: Goal: Knowledge of [place] General Education information/materials will improve Outcome: Progressing Goal: Emotional status will improve Outcome: Progressing Goal: Mental status will improve Outcome: Progressing Goal: Verbalization of understanding the information provided will improve Outcome: Progressing   Problem: Activity: Goal: Interest or engagement in activities will improve Outcome: Progressing Goal: Sleeping patterns will improve Outcome: Progressing   Problem: Coping: Goal: Ability to verbalize frustrations and anger appropriately will improve Outcome: Progressing Goal: Ability to demonstrate self-control will improve Outcome: Progressing   Problem: Health Behavior/Discharge Planning: Goal: Identification of resources available to assist in meeting health care needs will improve Outcome: Progressing Goal: Compliance with treatment plan for underlying cause of condition will improve Outcome: Progressing   Problem: Physical Regulation: Goal: Ability to maintain clinical measurements within normal limits will improve Outcome: Progressing   Problem: Safety: Goal: Periods of time without injury will increase Outcome: Progressing  noted.

## 2018-01-06 NOTE — Progress Notes (Signed)
[facility] MD Progress Note  01/06/2018 2:08 PM Lauren [name]  MRN:  [id number]   Subjective:  Pt states that overall she id getting better. She has brighter affect today. She asks questions about some of her guilty feelings about her past choices and would like to read some self help books to learn about it. She asked for some books that I may recommend that she can get at [employer]. She is sleeping better. Her concentration seems to be improving a lot and speech is much clearer. She is caring well for her ADLs. She is looking forward to meeting with the ACT team today and thinks this will be a good fit for her.   Principal Problem: Schizoaffective disorder ([place]) Diagnosis:   Patient Active Problem List   Diagnosis Date Noted  . Schizophrenia ([place]) [F20.9] 11/04/2017    Priority: High  . Schizoaffective disorder ([place]) [F25.9] 12/29/2017  . Morbid obesity ([place]) [E66.01] 12/09/2017  . Meningioma ([place]) [D32.9] 11/25/2017  . Atherosclerosis of aorta ([place]) [I70.0] 11/25/2017  . Calcification of coronary artery [I25.10, I25.84] 11/25/2017  . Acid reflux [K21.9] 06/29/2015  . Diabetes mellitus type 2, controlled, without complications ([place]) [A83.4] 06/29/2015  . Cardiac murmur [R01.1] 06/29/2015  . Arthritis of knee, degenerative [M17.10] 06/28/2015  . Insomnia w/ sleep apnea [G47.00, G47.30] 03/21/2015  . Anxiety disorder [F41.9] 03/21/2015  . Hypertension [I10] 03/21/2015  . HLD (hyperlipidemia) [E78.5] 03/21/2015  . Urge incontinence [N39.41] 03/21/2015   Total Time spent with patient: 20 minutes  Past Psychiatric History: See H&P  Past Medical History:  Past Medical History:  Diagnosis Date  . Anxiety   . Apnea, sleep 02/02/2014  . Awareness of heartbeats 02/02/2014  . Breathlessness on exertion 02/02/2014  . Diabetes mellitus   . Encounter for pre-employment examination 06/29/2015  . Excessive sweating 07/05/2015  . Fibromyalgia   . GERD (gastroesophageal reflux disease)    . Gravida 1 10/26/2015   1.    . Heart murmur   . Herniated disc   . Hyperlipidemia   . Hypertension   . Itch of skin 10/26/2015  . Lack of bladder control   . Lung tumor   . Rheumatoid arthritis ([place])   . Schizoaffective disorder ([place])   . Screening for cervical cancer 07/29/2017  . Seizure [facility])    after brain surgery 2012. last seizure 2013!  [name] Sex counseling 10/26/2015  . Sleep apnea   . 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 Stiffness of both knees 06/22/2015    Past Surgical History:  Procedure Laterality Date  . BRAIN TUMOR EXCISION  2012   benign  . CARDIAC CATHETERIZATION  2008  . CESAREAN SECTION    . CYST REMOVAL HAND    . LUNG LOBECTOMY  1977   benign tumor  . 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 Heart disease Brother   . Depression Mother   . Heart attack Mother   . Stroke Mother   . Alcohol abuse Father   . Stroke Father   . Diabetes Sister   . Diabetes Sister   . Stomach cancer Sister   . Kidney disease Sister   . COPD Brother   . Lung cancer Brother   . Diabetes Brother    Family Psychiatric  History: See H&P Social History:  Social History   Substance and Sexual Activity  Alcohol Use No  . Alcohol/week: 0.0 oz     Social History   Substance and Sexual Activity  Drug Use No    Social History   Socioeconomic History  . Marital status: Single    Spouse name: Not on file  . Number of children: Not on file  . Years of education: Not on file  . Highest education level: Not on file  Occupational History  . Not on file  Social Needs  . Financial resource strain: Not hard at all  . Food insecurity:    Worry: Never true    Inability: Never true  . Transportation needs:    Medical: No    Non-medical: No   Tobacco Use  . Smoking status: Never Smoker  . Smokeless tobacco: Never Used  Substance and Sexual Activity  . Alcohol use: No    Alcohol/week: 0.0 oz  . Drug use: No  . Sexual activity: Not Currently  Lifestyle  . Physical activity:    Days per week: 0 days    Minutes per session: 0 min  . Stress: Only a little  Relationships  . Social connections:    Talks on phone: Not on file    Gets together: Not on file    Attends religious service: More than 4 times per year    Active member of club or organization: No    Attends meetings of clubs or organizations: Never    Relationship status: Married  Other Topics Concern  . Not on file  Social History Narrative  . Not on file   Additional Social History:                         Sleep: Fair  Appetite:  Fair  Current Medications: Current Facility-Administered Medications  Medication Dose Route Frequency Provider Last Rate Last Dose  . acetaminophen (TYLENOL) tablet 650 mg  650 mg Oral Q6H PRN [name], [name], MD   650 mg at 01/06/18 0854  . alum & mag hydroxide-simeth (MAALOX/MYLANTA) 200-200-20 MG/5ML suspension 30 mL  30 mL Oral Q4H PRN [name], [name], MD      . aspirin EC tablet 81 mg  81 mg Oral Daily [name], [name], MD   81 mg at 01/06/18 0853  . benztropine (COGENTIN) tablet 1 mg  1 mg Oral QHS Ivoree Felmlee, Tyson Babinski, MD   1 mg at 01/05/18 2153  . diclofenac sodium (VOLTAREN) 1 % transdermal gel 4 g  4 g Topical QID PRN Jolene Schimke, MD   4 g at 01/04/18 1506  . haloperidol (HALDOL) tablet 5 mg  5 mg Oral QHS Ford Peddie, Tyson Babinski, MD   5 mg at 01/05/18 2153  . hydrochlorothiazide (HYDRODIURIL) tablet 25 mg  25 mg Oral Daily Cobi Delph, Tyson Babinski, MD   25 mg at 01/06/18 0854  . lisinopril (PRINIVIL,ZESTRIL) tablet 20 mg  20 mg Oral Daily [name], [name], MD   20 mg at 01/06/18 0853  . magnesium hydroxide (MILK OF MAGNESIA) suspension 30 mL  30 mL Oral Daily PRN [name], [name], MD      . metFORMIN (GLUCOPHAGE) tablet 500 mg  500  mg Oral BID WC [name], [name], MD   500 mg at 01/06/18 0853  . pantoprazole (PROTONIX) EC tablet 40 mg  40 mg Oral Daily [name], [name], MD   40 mg at  01/06/18 0853  . rosuvastatin (CRESTOR) tablet 40 mg  40 mg Oral q1800 [name], [name], MD   40 mg at 01/05/18 1800  . traZODone (DESYREL) tablet 50 mg  50 mg Oral QHS Keonta Monceaux, Tyson Babinski, MD   50 mg at 01/05/18 2153    Lab Results:  Results for orders placed or performed during the hospital encounter of 12/29/17 (from the past 48 hour(s))  Glucose, capillary     Status: None   Collection Time: 01/05/18  6:13 AM  Result Value Ref Range   Glucose-Capillary 97 65 - 99 mg/dL  Glucose, capillary     Status: Abnormal   Collection Time: 01/06/18  8:53 AM  Result Value Ref Range   Glucose-Capillary 159 (H) 65 - 99 mg/dL    Blood Alcohol level:  Lab Results  Component Value Date   ETH <10 12/28/2017   ETH <10 78/29/5621    Metabolic Disorder Labs: Lab Results  Component Value Date   HGBA1C 6.4 (H) 12/30/2017   MPG 136.98 12/30/2017   MPG 131.24 11/05/2017   No results found for: PROLACTIN Lab Results  Component Value Date   CHOL 137 12/30/2017   TRIG 75 12/30/2017   HDL 49 12/30/2017   CHOLHDL 2.8 12/30/2017   VLDL 15 12/30/2017   LDLCALC 73 12/30/2017   LDLCALC 163 (H) 11/05/2017    Physical Findings: AIMS:  , ,  ,  ,    CIWA:    COWS:     Musculoskeletal: Strength & Muscle Tone: within normal limits Gait & Station: normal Patient leans: N/A  Psychiatric Specialty Exam: Physical Exam  Nursing note and vitals reviewed.   Review of Systems  All other systems reviewed and are negative.   Blood pressure 138/86, pulse 75, temperature 98 F (36.7 C), temperature source Oral, resp. rate 16, height 5' 2.8" (1.595 m), weight 106.1 kg (234 lb), SpO2 98 %.Body mass index is 41.72 kg/m.  General Appearance: Casual  Eye Contact:  Good  Speech:  Clear and Coherent  Volume:  Normal  Mood:  Euthymic  Affect:  Appropriate   Thought Process:  Coherent and Goal Directed  Orientation:  Full (Time, Place, and Person)  Thought Content:  Logical  Suicidal Thoughts:  No  Homicidal Thoughts:  No  Memory:  Immediate;   Fair  Judgement:  Fair  Insight:  Fair  Psychomotor Activity:  Normal  Concentration:  Concentration: Fair  Recall:  [employer] of Knowledge:  Fair  Language:  Fair  Akathisia:  No      Assets:  Resilience  ADL's:  Intact  Cognition:  WNL  Sleep:  Number of Hours: 7.75     Treatment Plan Summary: 67 yo female admitted due to paranoia. Paranoid is improving and affect is much brighter. Her speech is much clearing and insight is better.   Plan:  Schizophrenia -Next Haldol decanoate 100 mg due on 5/21 -continue Oral haldol 5 mg qhs -Continue Cogentin 1 mg qhs Continue trazodone 50 mg qhs  HTN -BP better and HR normal since stopping atenolol  Dispo -She will meet with ACT team today Marylin Crosby, MD 01/06/2018, 2:08 PM

## 2018-01-06 NOTE — Progress Notes (Signed)
Patient ID: Lauren [name], female   DOB: [birth year], 67 y.o.   MRN: [id number] Left voicemail for [name] worker Juluis Pitch to update her on progress with Pt.  Dossie Arbour, LCSW

## 2018-01-06 NOTE — Plan of Care (Signed)
Patient verbalizes understanding of the general information that's been provided to her and has not voiced any further questions or concerns at this time. Patient has the ability to manage health-related needs and has been working towards maintaining her clinical measurements within normal limits. Patient has remained free from infection and her diagnostic tests have improved. Patient has been free from any health-related complications thus far. Patient's overall level of comfort has improved and she has remained free from injury on the unit. Patient's risk for impaired skin integrity has decreased and she stated to this writer that she slept "pretty well" last night. Patient has the ability to cope and has been attending/participating in unit groups when appropriate without any issues. Patient has the ability to make decisions for herself and has been in compliance with her prescribed therapeutic/medication regimen without any further questions/concerns. Patient denies SI/HI/AVH at this time. Patient states that her goal for today is to "try to be positive".  Patient also denies any signs/symptoms of depression and anxiety. Patient has been demonstrating self-control on the unit and remains safe at this time.  Problem: Education: Goal: Knowledge of General Education information will improve Outcome: Progressing   Problem: Health Behavior/Discharge Planning: Goal: Ability to manage health-related needs will improve Outcome: Progressing   Problem: Coping: Goal: Level of anxiety will decrease Outcome: Progressing   Problem: Pain Managment: Goal: General experience of comfort will improve Outcome: Progressing   Problem: Safety: Goal: Ability to remain free from injury will improve Outcome: Progressing   Problem: Skin Integrity: Goal: Risk for impaired skin integrity will decrease Outcome: Progressing   Problem: Education: Goal: Utilization of techniques to improve thought processes will  improve Outcome: Progressing Goal: Knowledge of the prescribed therapeutic regimen will improve Outcome: Progressing   Problem: Activity: Goal: Interest or engagement in leisure activities will improve Outcome: Progressing Goal: Imbalance in normal sleep/wake cycle will improve Outcome: Progressing   Problem: Coping: Goal: Coping ability will improve Outcome: Progressing Goal: Will verbalize feelings Outcome: Progressing   Problem: Health Behavior/Discharge Planning: Goal: Ability to make decisions will improve Outcome: Progressing Goal: Compliance with therapeutic regimen will improve Outcome: Progressing   Problem: Role Relationship: Goal: Will demonstrate positive changes in social behaviors and relationships Outcome: Progressing   Problem: Safety: Goal: Ability to disclose and discuss suicidal ideas will improve Outcome: Progressing Goal: Ability to identify and utilize support systems that promote safety will improve Outcome: Progressing   Problem: Self-Concept: Goal: Will verbalize positive feelings about self Outcome: Progressing Goal: Level of anxiety will decrease Outcome: Progressing   Problem: Education: Goal: Knowledge of  General Education information/materials will improve Outcome: Progressing Goal: Emotional status will improve Outcome: Progressing Goal: Mental status will improve Outcome: Progressing Goal: Verbalization of understanding the information provided will improve Outcome: Progressing   Problem: Activity: Goal: Interest or engagement in activities will improve Outcome: Progressing Goal: Sleeping patterns will improve Outcome: Progressing   Problem: Coping: Goal: Ability to verbalize frustrations and anger appropriately will improve Outcome: Progressing Goal: Ability to demonstrate self-control will improve Outcome: Progressing   Problem: Health Behavior/Discharge Planning: Goal: Identification of resources available to  assist in meeting health care needs will improve Outcome: Progressing Goal: Compliance with treatment plan for underlying cause of condition will improve Outcome: Progressing   Problem: Physical Regulation: Goal: Ability to maintain clinical measurements within normal limits will improve Outcome: Progressing   Problem: Safety: Goal: Periods of time without injury will increase Outcome: Progressing

## 2018-01-07 LAB — GLUCOSE, CAPILLARY: Glucose-Capillary: 147 mg/dL — ABNORMAL HIGH (ref 65–99)

## 2018-01-07 NOTE — B[facility] Group Notes (Signed)
LCSW Group Therapy Note  01/07/2018 1:00 PM  Type of Therapy/Topic:  Group Therapy:  Emotion Regulation  Participation Level:  Minimal   Description of Group:    The purpose of this group is to assist patients in learning to regulate negative emotions and experience positive emotions. Patients will be guided to discuss ways in which they have been vulnerable to their negative emotions. These vulnerabilities will be juxtaposed with experiences of positive emotions or situations, and patients will be challenged to use positive emotions to combat negative ones. Special emphasis will be placed on coping with negative emotions in conflict situations, and patients will process healthy conflict resolution skills.  Therapeutic Goals: 1. Patient will identify two positive emotions or experiences to reflect on in order to balance out negative emotions 2. Patient will label two or more emotions that they find the most difficult to experience 3. Patient will demonstrate positive conflict resolution skills through discussion and/or role plays  Summary of Patient Progress: Elyssa presented to today's group with noted anger, but was able to participate some in today's discussion on emotional regulation.  Sawyer shared that she often feels disappointment and distrust of her family which have been the two most difficult emotions for her over the past few years.  Sentoria shared that she tries to just stay away from her family in order to demonstrate positive conflict resolution skills.        Therapeutic Modalities:   Cognitive Behavioral Therapy Feelings Identification Dialectical Behavioral Therapy

## 2018-01-07 NOTE — Plan of Care (Signed)
Patient affect brighter today.  Denies SI/HI/AVH.  Medication compliant.  Attending groups although has minimal participation.  Support offered. Safety rounds maintained.

## 2018-01-07 NOTE — Progress Notes (Signed)
Recreation Therapy Notes  Date: 01/07/2018  Time: 9:30 am  Location: Craft Room  Behavioral response: Appropriate  Intervention Topic: Values  Discussion/Intervention:  Group content today was focused on values. The group identified what values are and where they come from. Individuals expressed some values and how many they have. Patients described how they go about adding or removing values. The group described the importance of having values and how they go about using them in daily life. Patient participated in the intervention "My Values" where they were able to pick out values that were important to them and make a visual aide.  Clinical Observations/Feedback:  Patient came to group late due to unknown reasons. She participated in the intervention during group. Madailein Londo LRT/CTRS          Dontasia Miranda 01/07/2018 11:59 AM

## 2018-01-07 NOTE — Plan of Care (Signed)
Patient is resting comfortably without any complains, minimal socialization with peers, attend groups, appetite is good, appear suspicious of her medications but compliant, denies any SI/HI 15 minute safety rounding is maintained no distress noted. Problem: Education: Goal: Knowledge of General Education information will improve Outcome: Progressing   Problem: Health Behavior/Discharge Planning: Goal: Ability to manage health-related needs will improve Outcome: Progressing   Problem: Coping: Goal: Level of anxiety will decrease Outcome: Progressing   Problem: Pain Managment: Goal: General experience of comfort will improve Outcome: Progressing   Problem: Safety: Goal: Ability to remain free from injury will improve Outcome: Progressing   Problem: Skin Integrity: Goal: Risk for impaired skin integrity will decrease Outcome: Progressing   Problem: Education: Goal: Utilization of techniques to improve thought processes will improve Outcome: Progressing Goal: Knowledge of the prescribed therapeutic regimen will improve Outcome: Progressing   Problem: Activity: Goal: Interest or engagement in leisure activities will improve Outcome: Progressing Goal: Imbalance in normal sleep/wake cycle will improve Outcome: Progressing   Problem: Coping: Goal: Coping ability will improve Outcome: Progressing Goal: Will verbalize feelings Outcome: Progressing   Problem: Health Behavior/Discharge Planning: Goal: Ability to make decisions will improve Outcome: Progressing Goal: Compliance with therapeutic regimen will improve Outcome: Progressing   Problem: Role Relationship: Goal: Will demonstrate positive changes in social behaviors and relationships Outcome: Progressing   Problem: Safety: Goal: Ability to disclose and discuss suicidal ideas will improve Outcome: Progressing Goal: Ability to identify and utilize support systems that promote safety will improve Outcome:  Progressing   Problem: Self-Concept: Goal: Will verbalize positive feelings about self Outcome: Progressing Goal: Level of anxiety will decrease Outcome: Progressing   Problem: Education: Goal: Knowledge of [facility] Education information/materials will improve Outcome: Progressing Goal: Emotional status will improve Outcome: Progressing Goal: Mental status will improve Outcome: Progressing Goal: Verbalization of understanding the information provided will improve Outcome: Progressing   Problem: Activity: Goal: Interest or engagement in activities will improve Outcome: Progressing Goal: Sleeping patterns will improve Outcome: Progressing   Problem: Coping: Goal: Ability to verbalize frustrations and anger appropriately will improve Outcome: Progressing Goal: Ability to demonstrate self-control will improve Outcome: Progressing   Problem: Health Behavior/Discharge Planning: Goal: Identification of resources available to assist in meeting health care needs will improve Outcome: Progressing Goal: Compliance with treatment plan for underlying cause of condition will improve Outcome: Progressing   Problem: Physical Regulation: Goal: Ability to maintain clinical measurements within normal limits will improve Outcome: Progressing   Problem: Safety: Goal: Periods of time without injury will increase Outcome: Progressing

## 2018-01-07 NOTE — Progress Notes (Signed)
[facility] MD Progress Note  01/07/2018 4:17 PM Lauren [name]  MRN:  [id number] Subjective:  Pt states that she is doing fairly well. She still feels suspicious about people and her apartment but this is improving. She met with ACT team yesterday and really felt comfortable with them and hopes they will accept her. She is sleeping and eating well. Affect is brighter. Denies SI or HI.   Principal Problem: Schizoaffective disorder ([place]) Diagnosis:   Patient Active Problem List   Diagnosis Date Noted  . Schizophrenia ([place]) [F20.9] 11/04/2017    Priority: High  . Schizoaffective disorder ([place]) [F25.9] 12/29/2017  . Morbid obesity ([place]) [E66.01] 12/09/2017  . Meningioma ([place]) [D32.9] 11/25/2017  . Atherosclerosis of aorta ([place]) [I70.0] 11/25/2017  . Calcification of coronary artery [I25.10, I25.84] 11/25/2017  . Acid reflux [K21.9] 06/29/2015  . Diabetes mellitus type 2, controlled, without complications ([place]) [Q03.4] 06/29/2015  . Cardiac murmur [R01.1] 06/29/2015  . Arthritis of knee, degenerative [M17.10] 06/28/2015  . Insomnia w/ sleep apnea [G47.00, G47.30] 03/21/2015  . Anxiety disorder [F41.9] 03/21/2015  . Hypertension [I10] 03/21/2015  . HLD (hyperlipidemia) [E78.5] 03/21/2015  . Urge incontinence [N39.41] 03/21/2015   Total Time spent with patient: 20 minutes  Past Psychiatric History: See H&P  Past Medical History:  Past Medical History:  Diagnosis Date  . Anxiety   . Apnea, sleep 02/02/2014  . Awareness of heartbeats 02/02/2014  . Breathlessness on exertion 02/02/2014  . Diabetes mellitus   . Encounter for pre-employment examination 06/29/2015  . Excessive sweating 07/05/2015  . Fibromyalgia   . GERD (gastroesophageal reflux disease)   . Gravida 1 10/26/2015   1.    . Heart murmur   . Herniated disc   . Hyperlipidemia   . Hypertension   . Itch of skin 10/26/2015  . Lack of bladder control   . Lung tumor   . Rheumatoid arthritis ([place])   . Schizoaffective  disorder ([place])   . Screening for cervical cancer 07/29/2017  . Seizure [facility])    after brain surgery 2012. last seizure 2013!  [name] Sex counseling 10/26/2015  . Sleep apnea   . 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 Stiffness of both knees 06/22/2015    Past Surgical History:  Procedure Laterality Date  . BRAIN TUMOR EXCISION  2012   benign  . CARDIAC CATHETERIZATION  2008  . CESAREAN SECTION    . CYST REMOVAL HAND    . LUNG LOBECTOMY  1977   benign tumor  . 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 Heart disease Brother   . Depression Mother   . Heart attack Mother   . Stroke Mother   . Alcohol abuse Father   . Stroke Father   . Diabetes Sister   . Diabetes Sister   . Stomach cancer Sister   . Kidney disease Sister   . COPD Brother   . Lung cancer Brother   . Diabetes Brother    Family Psychiatric  History: See h&P Social History:  Social History   Substance and Sexual Activity  Alcohol Use No  . Alcohol/week: 0.0 oz     Social History   Substance and Sexual Activity  Drug Use No    Social History   Socioeconomic History  .  Marital status: Single    Spouse name: Not on file  . Number of children: Not on file  . Years of education: Not on file  . Highest education level: Not on file  Occupational History  . Not on file  Social Needs  . Financial resource strain: Not hard at all  . Food insecurity:    Worry: Never true    Inability: Never true  . Transportation needs:    Medical: No    Non-medical: No  Tobacco Use  . Smoking status: Never Smoker  . Smokeless tobacco: Never Used  Substance and Sexual Activity  . Alcohol use: No    Alcohol/week: 0.0 oz  . Drug use: No  . Sexual activity: Not Currently  Lifestyle  . Physical  activity:    Days per week: 0 days    Minutes per session: 0 min  . Stress: Only a little  Relationships  . Social connections:    Talks on phone: Not on file    Gets together: Not on file    Attends religious service: More than 4 times per year    Active member of club or organization: No    Attends meetings of clubs or organizations: Never    Relationship status: Married  Other Topics Concern  . Not on file  Social History Narrative  . Not on file   Additional Social History:                         Sleep: Good  Appetite:  Good  Current Medications: Current Facility-Administered Medications  Medication Dose Route Frequency Provider Last Rate Last Dose  . acetaminophen (TYLENOL) tablet 650 mg  650 mg Oral Q6H PRN [name], [name], MD   650 mg at 01/07/18 0905  . alum & mag hydroxide-simeth (MAALOX/MYLANTA) 200-200-20 MG/5ML suspension 30 mL  30 mL Oral Q4H PRN [name], [name], MD      . aspirin EC tablet 81 mg  81 mg Oral Daily [name], [name], MD   81 mg at 01/07/18 0857  . benztropine (COGENTIN) tablet 1 mg  1 mg Oral QHS Tremaine Fuhriman, Tyson Babinski, MD   1 mg at 01/06/18 2114  . diclofenac sodium (VOLTAREN) 1 % transdermal gel 4 g  4 g Topical QID PRN Jolene Schimke, MD   4 g at 01/04/18 1506  . haloperidol (HALDOL) tablet 5 mg  5 mg Oral QHS Makaylia Hewett R, MD   5 mg at 01/06/18 2114  . hydrochlorothiazide (HYDRODIURIL) tablet 25 mg  25 mg Oral Daily Jamarkis Branam, Tyson Babinski, MD   25 mg at 01/07/18 0857  . lisinopril (PRINIVIL,ZESTRIL) tablet 20 mg  20 mg Oral Daily [name], [name], MD   20 mg at 01/07/18 0857  . magnesium hydroxide (MILK OF MAGNESIA) suspension 30 mL  30 mL Oral Daily PRN [name], [name], MD      . metFORMIN (GLUCOPHAGE) tablet 500 mg  500 mg Oral BID WC [name], [name], MD   500 mg at 01/07/18 0857  . pantoprazole (PROTONIX) EC tablet 40 mg  40 mg Oral Daily [name], [name], MD   40 mg at 01/07/18 0857  . rosuvastatin (CRESTOR) tablet 40 mg  40 mg Oral q1800 [name],  [name], MD   40 mg at 01/06/18 1716  . traZODone (DESYREL) tablet 50 mg  50 mg Oral QHS Marylin Crosby, MD   50 mg at 01/06/18 2114  Lab Results:  Results for orders placed or performed during the hospital encounter of 12/29/17 (from the past 48 hour(s))  Glucose, capillary     Status: Abnormal   Collection Time: 01/06/18  8:53 AM  Result Value Ref Range   Glucose-Capillary 159 (H) 65 - 99 mg/dL  Glucose, capillary     Status: None   Collection Time: 01/06/18  4:51 PM  Result Value Ref Range   Glucose-Capillary 97 65 - 99 mg/dL  Glucose, capillary     Status: Abnormal   Collection Time: 01/07/18  9:04 AM  Result Value Ref Range   Glucose-Capillary 147 (H) 65 - 99 mg/dL    Blood Alcohol level:  Lab Results  Component Value Date   ETH <10 12/28/2017   ETH <10 64/68/0321    Metabolic Disorder Labs: Lab Results  Component Value Date   HGBA1C 6.4 (H) 12/30/2017   MPG 136.98 12/30/2017   MPG 131.24 11/05/2017   No results found for: PROLACTIN Lab Results  Component Value Date   CHOL 137 12/30/2017   TRIG 75 12/30/2017   HDL 49 12/30/2017   CHOLHDL 2.8 12/30/2017   VLDL 15 12/30/2017   LDLCALC 73 12/30/2017   LDLCALC 163 (H) 11/05/2017    Physical Findings: AIMS:  , ,  ,  ,    CIWA:    COWS:     Musculoskeletal: Strength & Muscle Tone: within normal limits Gait & Station: normal Patient leans: N/A  Psychiatric Specialty Exam: Physical Exam  Nursing note and vitals reviewed.   Review of Systems  All other systems reviewed and are negative.   Blood pressure 123/79, pulse 81, temperature 98.1 F (36.7 C), temperature source Oral, resp. rate 16, height 5' 2.8" (1.595 m), weight 106.1 kg (234 lb), SpO2 98 %.Body mass index is 41.72 kg/m.  General Appearance: Casual  Eye Contact:  Good  Speech:  Clear and Coherent  Volume:  Normal  Mood:  Euthymic  Affect:  Appropriate  Thought Process:  Coherent and Goal Directed  Orientation:  Full (Time, Place, and  Person)  Thought Content:  Logical  Suicidal Thoughts:  No  Homicidal Thoughts:  No  Memory:  Immediate;   Fair  Judgement:  Good  Insight:  Fair  Psychomotor Activity:  Normal  Concentration:  Concentration: Fair  Recall:  [employer] of Knowledge:  Fair  Language:  Fair        Assets:  Resilience  ADL's:  Intact  Cognition:  WNL  Sleep:  Number of Hours: 7.45     Treatment Plan Summary: 67 yo female admitted due to Depression and paranoia. Symptoms are improving and paranoia is getting close to baseline. Speech is much clearer and insight is improving.   Plan:  Schizophrenia -Next Haldol decanoate injection 100 mg due on 5/21 -Continue oral Haldol 5 mg qhs -Continue Cogentin 1 mg qhs -Continue trazodone 50 mg qhs  HTN -BP under good control -Continue Lisinopril and HCTZ  Dispo -She will discharge possibly to a shelter until she can get into her new apartment. Waiting to see if ACT team accepts her into their program.     Marylin Crosby, MD 01/07/2018, 4:17 PM

## 2018-01-08 LAB — GLUCOSE, CAPILLARY: Glucose-Capillary: 103 mg/dL — ABNORMAL HIGH (ref 65–99)

## 2018-01-08 NOTE — Plan of Care (Signed)
  Problem: Coping: Goal: Level of anxiety will decrease Outcome: Progressing  Patient is interacting appropriately with peers and staff, appears less anxious and free from injury.

## 2018-01-08 NOTE — BHH Group Notes (Signed)
LCSW Group Therapy Note 01/08/2018 9:00 AM  Type of Therapy and Topic:  Group Therapy:  Setting Goals  Participation Level:  Active  Description of Group: In this process group, patients discussed using strengths to work toward goals and address challenges.  Patients identified two positive things about themselves and one goal they were working on.  Patients were given the opportunity to share openly and support each other's plan for self-empowerment.  The group discussed the value of gratitude and were encouraged to have a daily reflection of positive characteristics or circumstances.  Patients were encouraged to identify a plan to utilize their strengths to work on current challenges and goals.  Therapeutic Goals 1. Patient will verbalize personal strengths/positive qualities and relate how these can assist with achieving desired personal goals 2. Patients will verbalize affirmation of peers plans for personal change and goal setting 3. Patients will explore the value of gratitude and positive focus as related to successful achievement of goals 4. Patients will verbalize a plan for regular reinforcement of personal positive qualities and circumstances.  Summary of Patient Progress:  Ayanna was able to actively participate in today's group on setting goals.  Ahmira engage in the discussion on using the SMART Model to achieve the goal to  [place] reading a book that she brought into the hospital before she is discharged next week.  Vena also shared that another goal that she has established for herself is to be more open with her psychiatrist and ask more questions as she does not wish to have to come back into the hospital for awhile.     Therapeutic Modalities Cognitive Behavioral Therapy Motivational Interviewing    Devona Konig, [place] 01/08/2018 2:14 PM

## 2018-01-08 NOTE — BHH Group Notes (Signed)
  01/08/2018  Time: 1PM  Type of Therapy/Topic:  Group Therapy:  Balance in Life  Participation Level:  Active  Description of Group:   This group will address the concept of balance and how it feels and looks when one is unbalanced. Patients will be encouraged to process areas in their lives that are out of balance and identify reasons for remaining unbalanced. Facilitators will guide patients in utilizing problem-solving interventions to address and correct the stressor making their life unbalanced. Understanding and applying boundaries will be explored and addressed for obtaining and maintaining a balanced life. Patients will be encouraged to explore ways to assertively make their unbalanced needs known to significant others in their lives, using other group members and facilitator for support and feedback.  Therapeutic Goals: 1. Patient will identify two or more emotions or situations they have that consume much of in their lives. 2. Patient will identify signs/triggers that life has become out of balance:  3. Patient will identify two ways to set boundaries in order to achieve balance in their lives:  4. Patient will demonstrate ability to communicate their needs through discussion and/or role plays  Summary of Patient Progress: Pt continues to work towards their tx goals but has not yet reached them. Pt was able to appropriately participate in group discussion, and was able to offer support/validation to other group members. Pt reported one area of her life she would like to devote more attention to is, "staying out of the hospital." Pt reported one area of her life she'd like to devote less attention to is, "being a people pleaser." Pt stated one way she can achieve a better balance in life is to, "say no more."   Therapeutic Modalities:   Cognitive Behavioral Therapy Solution-Focused Therapy Assertiveness Training  Alden Hipp, MSW, LCSW Clinical Social Worker 01/08/2018 2:00 PM

## 2018-01-08 NOTE — Progress Notes (Signed)
[facility] MD Progress Note  01/08/2018 2:41 PM Lauren [name]  MRN:  [id number] Subjective:  Pt states that she is doing well today. She has bright affect. She has much better insight into herself and talks about her diagnosis of schizophrenia and how this has affected her life. She is sleeping well and eating well. She is hoping the ACT team will pick her up on Monday.   Principal Problem: Schizoaffective disorder ([place]) Diagnosis:   Patient Active Problem List   Diagnosis Date Noted  . Schizophrenia ([place]) [F20.9] 11/04/2017    Priority: High  . Schizoaffective disorder ([place]) [F25.9] 12/29/2017  . Morbid obesity ([place]) [E66.01] 12/09/2017  . Meningioma ([place]) [D32.9] 11/25/2017  . Atherosclerosis of aorta ([place]) [I70.0] 11/25/2017  . Calcification of coronary artery [I25.10, I25.84] 11/25/2017  . Acid reflux [K21.9] 06/29/2015  . Diabetes mellitus type 2, controlled, without complications ([place]) [T61.4] 06/29/2015  . Cardiac murmur [R01.1] 06/29/2015  . Arthritis of knee, degenerative [M17.10] 06/28/2015  . Insomnia w/ sleep apnea [G47.00, G47.30] 03/21/2015  . Anxiety disorder [F41.9] 03/21/2015  . Hypertension [I10] 03/21/2015  . HLD (hyperlipidemia) [E78.5] 03/21/2015  . Urge incontinence [N39.41] 03/21/2015   Total Time spent with patient: 20 minutes  Past Psychiatric History: See H&P  Past Medical History:  Past Medical History:  Diagnosis Date  . Anxiety   . Apnea, sleep 02/02/2014  . Awareness of heartbeats 02/02/2014  . Breathlessness on exertion 02/02/2014  . Diabetes mellitus   . Encounter for pre-employment examination 06/29/2015  . Excessive sweating 07/05/2015  . Fibromyalgia   . GERD (gastroesophageal reflux disease)   . Gravida 1 10/26/2015   1.    . Heart murmur   . Herniated disc   . Hyperlipidemia   . Hypertension   . Itch of skin 10/26/2015  . Lack of bladder control   . Lung tumor   . Rheumatoid arthritis ([place])   . Schizoaffective disorder ([place])   .  Screening for cervical cancer 07/29/2017  . Seizure [facility])    after brain surgery 2012. last seizure 2013!  [name] Sex counseling 10/26/2015  . Sleep apnea   . 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 Stiffness of both knees 06/22/2015    Past Surgical History:  Procedure Laterality Date  . BRAIN TUMOR EXCISION  2012   benign  . CARDIAC CATHETERIZATION  2008  . CESAREAN SECTION    . CYST REMOVAL HAND    . LUNG LOBECTOMY  1977   benign tumor  . 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 Heart disease Brother   . Depression Mother   . Heart attack Mother   . Stroke Mother   . Alcohol abuse Father   . Stroke Father   . Diabetes Sister   . Diabetes Sister   . Stomach cancer Sister   . Kidney disease Sister   . COPD Brother   . Lung cancer Brother   . Diabetes Brother    Family Psychiatric  History: See H&P Social History:  Social History   Substance and Sexual Activity  Alcohol Use No  . Alcohol/week: 0.0 oz     Social History   Substance and Sexual Activity  Drug Use No    Social History   Socioeconomic History  .  Marital status: Single    Spouse name: Not on file  . Number of children: Not on file  . Years of education: Not on file  . Highest education level: Not on file  Occupational History  . Not on file  Social Needs  . Financial resource strain: Not hard at all  . Food insecurity:    Worry: Never true    Inability: Never true  . Transportation needs:    Medical: No    Non-medical: No  Tobacco Use  . Smoking status: Never Smoker  . Smokeless tobacco: Never Used  Substance and Sexual Activity  . Alcohol use: No    Alcohol/week: 0.0 oz  . Drug use: No  . Sexual activity: Not Currently  Lifestyle  . Physical activity:    Days per  week: 0 days    Minutes per session: 0 min  . Stress: Only a little  Relationships  . Social connections:    Talks on phone: Not on file    Gets together: Not on file    Attends religious service: More than 4 times per year    Active member of club or organization: No    Attends meetings of clubs or organizations: Never    Relationship status: Married  Other Topics Concern  . Not on file  Social History Narrative  . Not on file   Additional Social History:                         Sleep: Good  Appetite:  Good  Current Medications: Current Facility-Administered Medications  Medication Dose Route Frequency Provider Last Rate Last Dose  . acetaminophen (TYLENOL) tablet 650 mg  650 mg Oral Q6H PRN [name], [name], MD   650 mg at 01/07/18 0905  . alum & mag hydroxide-simeth (MAALOX/MYLANTA) 200-200-20 MG/5ML suspension 30 mL  30 mL Oral Q4H PRN [name], [name], MD      . aspirin EC tablet 81 mg  81 mg Oral Daily [name], [name], MD   81 mg at 01/08/18 0818  . benztropine (COGENTIN) tablet 1 mg  1 mg Oral QHS Verginia Toohey, Tyson Babinski, MD   1 mg at 01/07/18 2200  . diclofenac sodium (VOLTAREN) 1 % transdermal gel 4 g  4 g Topical QID PRN Jolene Schimke, MD   4 g at 01/04/18 1506  . haloperidol (HALDOL) tablet 5 mg  5 mg Oral QHS Darienne Belleau, Tyson Babinski, MD   5 mg at 01/07/18 2200  . hydrochlorothiazide (HYDRODIURIL) tablet 25 mg  25 mg Oral Daily Pattrick Bady, Tyson Babinski, MD   25 mg at 01/08/18 0818  . lisinopril (PRINIVIL,ZESTRIL) tablet 20 mg  20 mg Oral Daily [name], [name], MD   20 mg at 01/08/18 0818  . magnesium hydroxide (MILK OF MAGNESIA) suspension 30 mL  30 mL Oral Daily PRN [name], [name], MD      . metFORMIN (GLUCOPHAGE) tablet 500 mg  500 mg Oral BID WC [name], [name], MD   500 mg at 01/08/18 0818  . pantoprazole (PROTONIX) EC tablet 40 mg  40 mg Oral Daily [name], [name], MD   40 mg at 01/08/18 0818  . rosuvastatin (CRESTOR) tablet 40 mg  40 mg Oral q1800 [name], [name], MD   40 mg at  01/07/18 1719  . traZODone (DESYREL) tablet 50 mg  50 mg Oral QHS Chiquetta Langner, Tyson Babinski, MD   50 mg at 01/07/18 2200  Lab Results:  Results for orders placed or performed during the hospital encounter of 12/29/17 (from the past 48 hour(s))  Glucose, capillary     Status: None   Collection Time: 01/06/18  4:51 PM  Result Value Ref Range   Glucose-Capillary 97 65 - 99 mg/dL  Glucose, capillary     Status: Abnormal   Collection Time: 01/07/18  9:04 AM  Result Value Ref Range   Glucose-Capillary 147 (H) 65 - 99 mg/dL  Glucose, capillary     Status: Abnormal   Collection Time: 01/08/18  7:20 AM  Result Value Ref Range   Glucose-Capillary 103 (H) 65 - 99 mg/dL    Blood Alcohol level:  Lab Results  Component Value Date   ETH <10 12/28/2017   ETH <10 41/96/2229    Metabolic Disorder Labs: Lab Results  Component Value Date   HGBA1C 6.4 (H) 12/30/2017   MPG 136.98 12/30/2017   MPG 131.24 11/05/2017   No results found for: PROLACTIN Lab Results  Component Value Date   CHOL 137 12/30/2017   TRIG 75 12/30/2017   HDL 49 12/30/2017   CHOLHDL 2.8 12/30/2017   VLDL 15 12/30/2017   LDLCALC 73 12/30/2017   LDLCALC 163 (H) 11/05/2017    Physical Findings: AIMS:  , ,  ,  ,    CIWA:    COWS:     Musculoskeletal: Strength & Muscle Tone: within normal limits Gait & Station: normal Patient leans: N/A  Psychiatric Specialty Exam: Physical Exam  Nursing note and vitals reviewed.   Review of Systems  All other systems reviewed and are negative.   Blood pressure 116/81, pulse 95, temperature 98.6 F (37 C), temperature source Oral, resp. rate 18, height 5' 2.8" (1.595 m), weight 106.1 kg (234 lb), SpO2 96 %.Body mass index is 41.72 kg/m.  General Appearance: Casual  Eye Contact:  Good  Speech:  Clear and Coherent  Volume:  Normal  Mood:  Euthymic  Affect:  Appropriate  Thought Process:  Coherent and Goal Directed  Orientation:  Full (Time, Place, and Person)  Thought Content:   Logical  Suicidal Thoughts:  No  Homicidal Thoughts:  No  Memory:  Immediate;   Fair  Judgement:  Fair  Insight:  Fair  Psychomotor Activity:  Normal  Concentration:  Concentration: Fair  Recall:  [employer] of Knowledge:  Fair  Language:  Fair  Akathisia:  No      Assets:  Resilience  ADL's:  Intact  Cognition:  WNL  Sleep:  Number of Hours: 6.75     Treatment Plan Summary: 67 yo female admitted due to depression and paranoia. Symptoms are improving signficant since getting back on Haldol.   Plan:  Schizophrenia -Next haldol decanoate injection 100 mg due on 5/21 -Continue oral Haldol 5 mg qhs -Continue Cogentin 1 mg qhs -Continue trazodone 50 mg qhs  HTN -Continue Lisinopril and HCTZ  Dispo -Likely discharge on Monday with ACT team  Marylin Crosby, MD 01/08/2018, 2:41 PM

## 2018-01-08 NOTE — Progress Notes (Signed)
Patient is alert and oriented x 4, denies SI/HI/AVH no distress noted,  Her thoughts are organized, speech is soft and coherent, she is interacting appropriately with peers and staff.. Patient's affect is bright on approach no distress noted.  Patient was offered support and encouragement,she was complaint with night medication, visible in the milieu interacting appropriately with peers and staff. 15 minutes safety rounds maintained will continue to closely monitor.

## 2018-01-08 NOTE — Plan of Care (Signed)
Denies SI/HI/AVH.  Rates depression as 5/10.[name] Smiles with engagement.  Medication and group compliant.   Problem: Education: Goal: Knowledge of General Education information will improve Outcome: Progressing   Problem: Health Behavior/Discharge Planning: Goal: Ability to manage health-related needs will improve Outcome: Progressing   Problem: Coping: Goal: Level of anxiety will decrease Outcome: Progressing   Problem: Pain Managment: Goal: General experience of comfort will improve Outcome: Progressing   Problem: Safety: Goal: Ability to remain free from injury will improve Outcome: Progressing   Problem: Skin Integrity: Goal: Risk for impaired skin integrity will decrease Outcome: Progressing   Problem: Education: Goal: Utilization of techniques to improve thought processes will improve Outcome: Progressing Goal: Knowledge of the prescribed therapeutic regimen will improve Outcome: Progressing   Problem: Activity: Goal: Interest or engagement in leisure activities will improve Outcome: Progressing Goal: Imbalance in normal sleep/wake cycle will improve Outcome: Progressing   Problem: Coping: Goal: Coping ability will improve Outcome: Progressing Goal: Will verbalize feelings Outcome: Progressing   Problem: Health Behavior/Discharge Planning: Goal: Ability to make decisions will improve Outcome: Progressing Goal: Compliance with therapeutic regimen will improve Outcome: Progressing   Problem: Role Relationship: Goal: Will demonstrate positive changes in social behaviors and relationships Outcome: Progressing   Problem: Safety: Goal: Ability to disclose and discuss suicidal ideas will improve Outcome: Progressing Goal: Ability to identify and utilize support systems that promote safety will improve Outcome: Progressing   Problem: Self-Concept: Goal: Will verbalize positive feelings about self Outcome: Progressing Goal: Level of anxiety will  decrease Outcome: Progressing   Problem: Education: Goal: Knowledge of [place] [facility] Education information/materials will improve Outcome: Progressing Goal: Emotional status will improve Outcome: Progressing Goal: Mental status will improve Outcome: Progressing Goal: Verbalization of understanding the information provided will improve Outcome: Progressing   Problem: Activity: Goal: Interest or engagement in activities will improve Outcome: Progressing Goal: Sleeping patterns will improve Outcome: Progressing   Problem: Coping: Goal: Ability to verbalize frustrations and anger appropriately will improve Outcome: Progressing Goal: Ability to demonstrate self-control will improve Outcome: Progressing   Problem: Health Behavior/Discharge Planning: Goal: Identification of resources available to assist in meeting health care needs will improve Outcome: Progressing Goal: Compliance with treatment plan for underlying cause of condition will improve Outcome: Progressing   Problem: Physical Regulation: Goal: Ability to maintain clinical measurements within normal limits will improve Outcome: Progressing   Problem: Safety: Goal: Periods of time without injury will increase Outcome: Progressing

## 2018-01-08 NOTE — Progress Notes (Signed)
Recreation Therapy Notes  Date: 01/08/2018  Time: 9:30 am  Location: Craft Room  Behavioral response: Appropriate  Intervention Topic: Creative expressions  Discussion/Intervention:  Group content on today was focused on creative expressions. The group defined creative expressions and ways they use creative expressions. Individual identified other positive ways creative expressions can be used and why it is important to express yourself. Patients participated in the intervention "expressive painting", where they had a chance to creatively express themselves. Clinical Observations/Feedback:  Patient came to group and identified reading as a way she creatively expresses herself. Individual stated she participates in creative expressions because it clears her mind and it feels like she accomplished something. She participated in the intervention and was social with peers and staff during group.   Deago Burruss LRT/CTRS         Jayden Rudge 01/08/2018 10:54 AM

## 2018-01-09 LAB — GLUCOSE, CAPILLARY: Glucose-Capillary: 106 mg/dL — ABNORMAL HIGH (ref 65–99)

## 2018-01-09 NOTE — B[facility] Group Notes (Signed)

## 2018-01-09 NOTE — Progress Notes (Signed)
Patient ID: Lauren [name], female   DOB: Oct 26, 1950, 67 y.o.   MRN: [id number] CSW met with pt for d/c planning.  CSW inquired if pt was planning to be d/c to the local homeless shelter.  Pt informed CSW that she would be going to a local hotel ( later learned that it is the [employer] off of [address], [place], [place]), hopefully for only one night, but she would be going to [employer], [place] first to get her car.  CSW inquired about the address in [place] that she would be going.  Pt informed CSW that she thinks it is [place]  CSW informed pt that she would like to let the [place] with the ACTT know what her plans were so she would know exactly where pt was going to be.  CSW reminded pt that she is scheduled to meet with [name] at the [facility] office on 01/13/18 at [place] contacted [name] to confirm that she would not be able to meet with pt on 01/12/17 which the day she is to be d/c from the hospital.  [place] expressed her concerns with pt being vague about where she is going to be staying prior to moving into the apt.  [name] asked CSW if she could talk with pt's psychiatrist about being d/c on 01/13/18 and that she come directly to the office to meet with her.  [name] shared that she would complete the intake paperwork with pt and then make sure that she can assist with getting her to her car and to the motel as well as to the meeting with the apt manager at 4p.  CSW discuss d/c with pt's psychiatrist who agreed to d/c her on 01/13/18 so she can go directly to the office to meet [place].  CSW informed pt of plan which she agreed with.  CSW will ask CSW Dossie Arbour to request funds for a taxi for pt to get to the [employer] office.

## 2018-01-09 NOTE — Tx Team (Signed)
Interdisciplinary Treatment and Diagnostic Plan Update  01/09/2018 Time of Session: 11:15am Kayci [place] MRN: [id number]  Principal Diagnosis: Schizoaffective disorder [facility])  Secondary Diagnoses: Principal Problem:   Schizoaffective disorder ([place]) Active Problems:   Hypertension   Acid reflux   Diabetes mellitus type 2, controlled, without complications ([place])   Current Medications:  Current Facility-Administered Medications  Medication Dose Route Frequency Provider Last Rate Last Dose  . acetaminophen (TYLENOL) tablet 650 mg  650 mg Oral Q6H PRN [name], [name], MD   650 mg at 01/09/18 0741  . alum & mag hydroxide-simeth (MAALOX/MYLANTA) 200-200-20 MG/5ML suspension 30 mL  30 mL Oral Q4H PRN [name], [name], MD      . aspirin EC tablet 81 mg  81 mg Oral Daily [name], [name], MD   81 mg at 01/09/18 0741  . benztropine (COGENTIN) tablet 1 mg  1 mg Oral QHS McNew, Tyson Babinski, MD   1 mg at 01/08/18 2252  . diclofenac sodium (VOLTAREN) 1 % transdermal gel 4 g  4 g Topical QID PRN Jolene Schimke, MD   4 g at 01/04/18 1506  . haloperidol (HALDOL) tablet 5 mg  5 mg Oral QHS McNew, Tyson Babinski, MD   5 mg at 01/08/18 2252  . hydrochlorothiazide (HYDRODIURIL) tablet 25 mg  25 mg Oral Daily McNew, Tyson Babinski, MD   25 mg at 01/09/18 0741  . lisinopril (PRINIVIL,ZESTRIL) tablet 20 mg  20 mg Oral Daily [name], [name], MD   20 mg at 01/09/18 0741  . magnesium hydroxide (MILK OF MAGNESIA) suspension 30 mL  30 mL Oral Daily PRN [name], [name], MD      . metFORMIN (GLUCOPHAGE) tablet 500 mg  500 mg Oral BID WC [name], [name], MD   500 mg at 01/09/18 0741  . pantoprazole (PROTONIX) EC tablet 40 mg  40 mg Oral Daily [name], [name], MD   40 mg at 01/09/18 0741  . rosuvastatin (CRESTOR) tablet 40 mg  40 mg Oral q1800 [name], [name], MD   40 mg at 01/08/18 1733  . traZODone (DESYREL) tablet 50 mg  50 mg Oral QHS McNew, Tyson Babinski, MD   50 mg at 01/08/18 2252   PTA Medications: Medications Prior to  Admission  Medication Sig Dispense Refill Last Dose  . aspirin EC 81 MG tablet Take 81 mg by mouth daily before breakfast.    12/28/2017 at Unknown time  . benztropine (COGENTIN) 0.5 MG tablet Take 1 tablet (0.5 mg total) by mouth at bedtime. 60 tablet 0 12/28/2017 at Unknown time  . Bioflavonoid Products (VITAMIN C) CHEW Chew 1 tablet by mouth daily as needed (for immune system support).   12/28/2017 at Unknown time  . Capsaicin (ZOSTRIX EX) Apply 1 application topically 4 (four) times daily as needed (for knee pain.).   12/28/2017 at Unknown time  . haloperidol (HALDOL) 10 MG tablet Take 1 tablet (10 mg total) by mouth at bedtime. 30 tablet 0 12/28/2017 at Unknown time  . haloperidol decanoate (HALDOL DECANOATE) 100 MG/ML injection Inject 0.5 mLs (50 mg total) into the muscle every 28 (twenty-eight) days. Next injection due on 12/02/17 1 mL 0 12/28/2017 at Unknown time  . ibuprofen (ADVIL,MOTRIN) 200 MG tablet Take 600 mg by mouth every 8 (eight) hours as needed (for knee pain.).   12/28/2017 at Unknown time  . lisinopril (PRINIVIL,ZESTRIL) 20 MG tablet Take 1 tablet (20 mg total) by mouth daily. 30 tablet 0 12/28/2017 at Unknown time  .  Lotion Base LOTN Apply 1 application topically 4 (four) times daily as needed (for knee pain.). [employer] Essential Lotion (Aloe Vera, [employer], and 6 natural anti-inflammatory essential oils; [place], [place], [place], [place], Peppermint and [place])   12/28/2017 at Unknown time  . metFORMIN (GLUCOPHAGE) 500 MG tablet Take 1 tablet (500 mg total) by mouth 2 (two) times daily with a meal. 60 tablet 0 12/28/2017 at Unknown time  . omeprazole (PRILOSEC) 20 MG capsule TAKE 1 CAPSULE BY MOUTH  DAILY 90 capsule 0 12/28/2017 at Unknown time  . rosuvastatin (CRESTOR) 40 MG tablet Take 1 tablet (40 mg total) by mouth daily. 30 tablet 0 12/28/2017 at Unknown time  . traZODone (DESYREL) 100 MG tablet Take 100 mg by mouth at bedtime as needed for sleep.    12/28/2017 at Unknown time    Patient Stressors: Financial difficulties Loss of suppoottt system  Patient Strengths: Ability for insight Capable of independent living General fund of knowledge  Treatment Modalities: Medication Management, Group therapy, Case management,  1 to 1 session with clinician, Psychoeducation, Recreational therapy.   Physician Treatment Plan for Primary Diagnosis: Schizoaffective disorder ([place]) Long Term Goal(s): Improvement in symptoms so as ready for discharge   Short Term Goals: Ability to demonstrate self-control will improve  Medication Management: Evaluate patient's response, side effects, and tolerance of medication regimen.  Therapeutic Interventions: 1 to 1 sessions, Unit Group sessions and Medication administration.  Evaluation of Outcomes: Progressing  Physician Treatment Plan for Secondary Diagnosis: Principal Problem:   Schizoaffective disorder ([place]) Active Problems:   Hypertension   Acid reflux   Diabetes mellitus type 2, controlled, without complications ([place])  Long Term Goal(s): Improvement in symptoms so as ready for discharge   Short Term Goals: Ability to demonstrate self-control will improve     Medication Management: Evaluate patient's response, side effects, and tolerance of medication regimen.  Therapeutic Interventions: 1 to 1 sessions, Unit Group sessions and Medication administration.  Evaluation of Outcomes: Progressing   RN Treatment Plan for Primary Diagnosis: Schizoaffective disorder ([place]) Long Term Goal(s): Knowledge of disease and therapeutic regimen to maintain health will improve  Short Term Goals: Ability to identify and develop effective coping behaviors will improve and Compliance with prescribed medications will improve  Medication Management: RN will administer medications as ordered by provider, will assess and evaluate patient's response and provide education to patient for prescribed medication. RN will  report any adverse and/or side effects to prescribing provider.  Therapeutic Interventions: 1 on 1 counseling sessions, Psychoeducation, Medication administration, Evaluate responses to treatment, Monitor vital signs and CBGs as ordered, Perform/monitor CIWA, COWS, AIMS and Fall Risk screenings as ordered, Perform wound care treatments as ordered.  Evaluation of Outcomes: Progressing   LCSW Treatment Plan for Primary Diagnosis: Schizoaffective disorder ([place]) Long Term Goal(s): Safe transition to appropriate next level of care at discharge, Engage patient in therapeutic group addressing interpersonal concerns.  Short Term Goals: Engage patient in aftercare planning with referrals and resources, Identify triggers associated with mental health/substance abuse issues and Increase skills for wellness and recovery  Therapeutic Interventions: Assess for all discharge needs, 1 to 1 time with Social worker, Explore available resources and support systems, Assess for adequacy in community support network, Educate family and significant other(s) on suicide prevention, Complete Psychosocial Assessment, Interpersonal group therapy.  Evaluation of Outcomes: Progressing   Progress in Treatment: Attending groups: Yes. Participating in groups: Yes. Taking medication as prescribed: Yes. Toleration medication: Yes. Family/Significant other contact made:  Yes, individual(s) contacted:  Pt's sister, [name], has been contacted. Patient understands diagnosis: Yes. Discussing patient identified problems/goals with staff: Yes. Medical problems stabilized or resolved: Yes. Denies suicidal/homicidal ideation: Yes. Issues/concerns per patient self-inventory: Yes. Other:    New problem(s) identified: Yes, Describe:     New Short Term/Long Term Goal(s):to be able to focus, have better attention. To improve depression so she can continue working toward a place to live.  Discharge Plan or Barriers: Pt will work  with ACTT with [name] and will hopefull move into her new apartment within the week.  Reason for Continuation of Hospitalization: Anxiety Medication stabilization  Estimated Length of Stay: 3-5 days  Recreational Therapy: Patient Stressors: Friends, living situation  Patient Goal: Patient will identify 3 positive coping skills to decrease depressive symptoms within 5 recreation therapy group sessions  Attendees: Patient: 01/09/2018 4:01 PM  Physician: Amador Cunas, MD 01/09/2018 4:01 PM  Nursing: Elige Radon, RN 01/09/2018 4:01 PM  RN Care Manager: 01/09/2018 4:01 PM  Social Worker: Derrek Gu, LCSW 01/09/2018 4:01 PM  Recreational Therapist: Roanna Epley, LRT 01/09/2018 4:01 PM  Other:  01/09/2018 4:01 PM  Other:  01/09/2018 4:01 PM  Other: 01/09/2018 4:01 PM    Scribe for Treatment Team: Devona Konig, LCSW 01/09/2018 4:01 PM

## 2018-01-09 NOTE — Plan of Care (Signed)
Patient is pleasant and cooperative with brighter affect.Denies SI ,HI and AVH.Appropriate with staff & peers.Compliant with medications.Attended groups.Appetite and energy level good.support and encouragement given.

## 2018-01-09 NOTE — Plan of Care (Signed)
  Problem: Coping: Goal: Level of anxiety will decrease Outcome: Progressing  Patient appears less anxious no distress noted.

## 2018-01-09 NOTE — Plan of Care (Signed)
Pt. Verbalizes understanding of provided education. Pt. Reports she can remain safe while on the unit, verbally contracts for safety. Pt. Denies SI/HI. Pt. Reports she went to activities today and had fun, pt. Also attended snack time this evening with peers. Pt. Compliant with medications, but does continue to request to see the medication packaging labels before they are opened and given to the patient. Pt. Reports sleeping good.     Problem: Education: Goal: Knowledge of General Education information will improve Outcome: Progressing   Problem: Safety: Goal: Ability to remain free from injury will improve Outcome: Progressing   Problem: Activity: Goal: Interest or engagement in leisure activities will improve Outcome: Progressing   Problem: Health Behavior/Discharge Planning: Goal: Compliance with therapeutic regimen will improve Outcome: Progressing   Problem: Safety: Goal: Ability to disclose and discuss suicidal ideas will improve Outcome: Progressing   Problem: Education: Goal: Knowledge of [place] General Education information/materials will improve Outcome: Progressing   Problem: Activity: Goal: Interest or engagement in activities will improve Outcome: Progressing Goal: Sleeping patterns will improve Outcome: Progressing   Problem: Health Behavior/Discharge Planning: Goal: Compliance with treatment plan for underlying cause of condition will improve Outcome: Progressing   Problem: Safety: Goal: Periods of time without injury will increase Outcome: Progressing

## 2018-01-09 NOTE — Progress Notes (Signed)
[facility] MD Progress Note  01/09/2018 2:57 PM Lauren [name]  MRN:  [id number] Subjective:  Pt states that she is doing well. She is socializing more with her peers. She is attending all groups. We had nice conversation about her readings on introverted personality. She has brighter affect. She is getting more excited about her possible apartment but is hesitant because she has been scammed in the past.   Principal Problem: Schizoaffective disorder ([place]) Diagnosis:   Patient Active Problem List   Diagnosis Date Noted  . Schizophrenia ([place]) [F20.9] 11/04/2017    Priority: High  . Schizoaffective disorder ([place]) [F25.9] 12/29/2017  . Morbid obesity ([place]) [E66.01] 12/09/2017  . Meningioma ([place]) [D32.9] 11/25/2017  . Atherosclerosis of aorta ([place]) [I70.0] 11/25/2017  . Calcification of coronary artery [I25.10, I25.84] 11/25/2017  . Acid reflux [K21.9] 06/29/2015  . Diabetes mellitus type 2, controlled, without complications ([place]) [T55.7] 06/29/2015  . Cardiac murmur [R01.1] 06/29/2015  . Arthritis of knee, degenerative [M17.10] 06/28/2015  . Insomnia w/ sleep apnea [G47.00, G47.30] 03/21/2015  . Anxiety disorder [F41.9] 03/21/2015  . Hypertension [I10] 03/21/2015  . HLD (hyperlipidemia) [E78.5] 03/21/2015  . Urge incontinence [N39.41] 03/21/2015   Total Time spent with patient: 20 minutes  Past Psychiatric History: See H&P  Past Medical History:  Past Medical History:  Diagnosis Date  . Anxiety   . Apnea, sleep 02/02/2014  . Awareness of heartbeats 02/02/2014  . Breathlessness on exertion 02/02/2014  . Diabetes mellitus   . Encounter for pre-employment examination 06/29/2015  . Excessive sweating 07/05/2015  . Fibromyalgia   . GERD (gastroesophageal reflux disease)   . Gravida 1 10/26/2015   1.    . Heart murmur   . Herniated disc   . Hyperlipidemia   . Hypertension   . Itch of skin 10/26/2015  . Lack of bladder control   . Lung tumor   . Rheumatoid arthritis ([place])   .  Schizoaffective disorder ([place])   . Screening for cervical cancer 07/29/2017  . Seizure [facility])    after brain surgery 2012. last seizure 2013!  [name] Sex counseling 10/26/2015  . Sleep apnea   . 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 Stiffness of both knees 06/22/2015    Past Surgical History:  Procedure Laterality Date  . BRAIN TUMOR EXCISION  2012   benign  . CARDIAC CATHETERIZATION  2008  . CESAREAN SECTION    . CYST REMOVAL HAND    . LUNG LOBECTOMY  1977   benign tumor  . 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 Heart disease Brother   . Depression Mother   . Heart attack Mother   . Stroke Mother   . Alcohol abuse Father   . Stroke Father   . Diabetes Sister   . Diabetes Sister   . Stomach cancer Sister   . Kidney disease Sister   . COPD Brother   . Lung cancer Brother   . Diabetes Brother    Family Psychiatric  History: See H&P Social History:  Social History   Substance and Sexual Activity  Alcohol Use No  . Alcohol/week: 0.0 oz     Social History   Substance and Sexual Activity  Drug Use No    Social History   Socioeconomic History  .  Marital status: Single    Spouse name: Not on file  . Number of children: Not on file  . Years of education: Not on file  . Highest education level: Not on file  Occupational History  . Not on file  Social Needs  . Financial resource strain: Not hard at all  . Food insecurity:    Worry: Never true    Inability: Never true  . Transportation needs:    Medical: No    Non-medical: No  Tobacco Use  . Smoking status: Never Smoker  . Smokeless tobacco: Never Used  Substance and Sexual Activity  . Alcohol use: No    Alcohol/week: 0.0 oz  . Drug use: No  . Sexual activity: Not Currently  Lifestyle   . Physical activity:    Days per week: 0 days    Minutes per session: 0 min  . Stress: Only a little  Relationships  . Social connections:    Talks on phone: Not on file    Gets together: Not on file    Attends religious service: More than 4 times per year    Active member of club or organization: No    Attends meetings of clubs or organizations: Never    Relationship status: Married  Other Topics Concern  . Not on file  Social History Narrative  . Not on file   Additional Social History:                         Sleep: Good  Appetite:  Good  Current Medications: Current Facility-Administered Medications  Medication Dose Route Frequency Provider Last Rate Last Dose  . acetaminophen (TYLENOL) tablet 650 mg  650 mg Oral Q6H PRN [name], [name], MD   650 mg at 01/09/18 0741  . alum & mag hydroxide-simeth (MAALOX/MYLANTA) 200-200-20 MG/5ML suspension 30 mL  30 mL Oral Q4H PRN [name], [name], MD      . aspirin EC tablet 81 mg  81 mg Oral Daily [name], [name], MD   81 mg at 01/09/18 0741  . benztropine (COGENTIN) tablet 1 mg  1 mg Oral QHS Kaydynce Pat, Tyson Babinski, MD   1 mg at 01/08/18 2252  . diclofenac sodium (VOLTAREN) 1 % transdermal gel 4 g  4 g Topical QID PRN Jolene Schimke, MD   4 g at 01/04/18 1506  . haloperidol (HALDOL) tablet 5 mg  5 mg Oral QHS Zyara Riling, Tyson Babinski, MD   5 mg at 01/08/18 2252  . hydrochlorothiazide (HYDRODIURIL) tablet 25 mg  25 mg Oral Daily Moana Munford, Tyson Babinski, MD   25 mg at 01/09/18 0741  . lisinopril (PRINIVIL,ZESTRIL) tablet 20 mg  20 mg Oral Daily [name], [name], MD   20 mg at 01/09/18 0741  . magnesium hydroxide (MILK OF MAGNESIA) suspension 30 mL  30 mL Oral Daily PRN [name], [name], MD      . metFORMIN (GLUCOPHAGE) tablet 500 mg  500 mg Oral BID WC [name], [name], MD   500 mg at 01/09/18 0741  . pantoprazole (PROTONIX) EC tablet 40 mg  40 mg Oral Daily [name], [name], MD   40 mg at 01/09/18 0741  . rosuvastatin (CRESTOR) tablet 40 mg  40 mg Oral  q1800 [name], [name], MD   40 mg at 01/08/18 1733  . traZODone (DESYREL) tablet 50 mg  50 mg Oral QHS Lucion Dilger, Tyson Babinski, MD   50 mg at 01/08/18 2252  Lab Results:  Results for orders placed or performed during the hospital encounter of 12/29/17 (from the past 48 hour(s))  Glucose, capillary     Status: Abnormal   Collection Time: 01/08/18  7:20 AM  Result Value Ref Range   Glucose-Capillary 103 (H) 65 - 99 mg/dL  Glucose, capillary     Status: Abnormal   Collection Time: 01/09/18  7:38 AM  Result Value Ref Range   Glucose-Capillary 106 (H) 65 - 99 mg/dL    Blood Alcohol level:  Lab Results  Component Value Date   ETH <10 12/28/2017   ETH <10 81/85/6314    Metabolic Disorder Labs: Lab Results  Component Value Date   HGBA1C 6.4 (H) 12/30/2017   MPG 136.98 12/30/2017   MPG 131.24 11/05/2017   No results found for: PROLACTIN Lab Results  Component Value Date   CHOL 137 12/30/2017   TRIG 75 12/30/2017   HDL 49 12/30/2017   CHOLHDL 2.8 12/30/2017   VLDL 15 12/30/2017   LDLCALC 73 12/30/2017   LDLCALC 163 (H) 11/05/2017    Physical Findings: AIMS:  , ,  ,  ,    CIWA:    COWS:     Musculoskeletal: Strength & Muscle Tone: within normal limits Gait & Station: normal Patient leans: N/A  Psychiatric Specialty Exam: Physical Exam  Nursing note and vitals reviewed.   Review of Systems  All other systems reviewed and are negative.   Blood pressure (!) 129/93, pulse 90, temperature 98.2 F (36.8 C), resp. rate 16, height 5' 2.8" (1.595 m), weight 106.1 kg (234 lb), SpO2 97 %.Body mass index is 41.72 kg/m.  General Appearance: Casual  Eye Contact:  Good  Speech:  Clear and Coherent  Volume:  Normal  Mood:  Euthymic  Affect:  Appropriate  Thought Process:  Coherent and Goal Directed  Orientation:  Full (Time, Place, and Person)  Thought Content:  Logical  Suicidal Thoughts:  No  Homicidal Thoughts:  No  Memory:  Immediate;   Fair  Judgement:  Fair  Insight:   Fair  Psychomotor Activity:  Normal  Concentration:  Concentration: Fair  Recall:  [employer] of Knowledge:  Fair  Language:  Fair  Akathisia:  No      Assets:  Resilience  ADL's:  Intact  Cognition:  WNL  Sleep:  Number of Hours: 6.75     Treatment Plan Summary: 67 yo female admitted due to paranoia. Symptoms are improving significantly. We are collaborating with ACT team on a safe discharge plan for her. She does have baseline paranoia preventing her from fully trusting certain people.   Plan:  Schizophrenia -Next Haldol decanoate injection due 5/21 -Continue oral Haldol 5 mg qhs -Continue Cogentin 1 mg qhs -Continue trazodone 50 mg qhs  HTN -Stable -Continue current regimen  Dispo -Working on safe discharge plan with ACT team. Likely discharge Tuesday as she has potential apartment for Tuesday. Discharging her to shelter would be unsafe plan due to paranoia Marylin Crosby, MD 01/09/2018, 2:57 PM

## 2018-01-09 NOTE — Progress Notes (Signed)
Patient is alert and oriented x 4, denies SI/HI/AVH no distress noted, thoughts are organized, speech is soft and coherent. Patient's affect is bright on approach no distress noted.  Patient was offered support and encouragement,she was complaint with night medication, visible in the milieu interacting appropriately with peers and staff. 15 minutes safety rounds maintained will continue to closely monitor

## 2018-01-09 NOTE — Progress Notes (Signed)
Recreation Therapy Notes  Date: 01/09/2018  Time: 9:30 am  Location: Craft Room  Behavioral response: Appropriate  Intervention Topic: Leisure  Discussion/Intervention:  Group content today was focused on leisure. The group defined what leisure is and some positive leisure activities they participate in. Individuals identified the difference between good and bad leisure. Participants expressed how they feel after participating in the leisure of their choice. The group discussed how they go about picking a leisure activity and if others are involved in their leisure activities. The patient stated how many leisure activities they too choose from and reasons why it is important to have leisure time. Individuals participated in the intervention "Leisure Jeopardy" where they had a chance to identify new leisure activities as well as benefits of leisure. Clinical Observations/Feedback:  Patient came to group and defined leisure as making time for yourself. She identified reading and listening to music as part of her leisure. Individual described the importance of making time for leisure as balancing life. Patient stated she has very little time for leisure. Individual participated in the intervention and was social with peers and staff doing group.  Lauren [name] LRT/CTRS         Lauren [name] 01/09/2018 10:47 AM

## 2018-01-10 DIAGNOSIS — F259 Schizoaffective disorder, unspecified: Principal | ICD-10-CM

## 2018-01-10 LAB — GLUCOSE, CAPILLARY
Glucose-Capillary: 121 mg/dL — ABNORMAL HIGH (ref 65–99)
Glucose-Capillary: 105 mg/dL — ABNORMAL HIGH (ref 65–99)
Glucose-Capillary: 114 mg/dL — ABNORMAL HIGH (ref 65–99)

## 2018-01-10 NOTE — [facility] Group Notes (Signed)
LCSW Group Therapy Note  01/10/2018 1:15pm  Type of Therapy and Topic: Group Therapy: Holding on to Grudges   Participation Level: Active   Description of Group:  In this group patients will be asked to explore and define a grudge. Patients will be guided to discuss their thoughts, feelings, and reasons as to why people have grudges. Patients will process the impact grudges have on daily life and identify thoughts and feelings related to holding grudges. Facilitator will challenge patients to identify ways to let go of grudges and the benefits this provides. Patients will be confronted to address why one struggles letting go of grudges. Lastly, patients will identify feelings and thoughts related to what life would look like without grudges. This group will be process-oriented, with patients participating in exploration of their own experiences, giving and receiving support, and processing challenge from other group members.  Therapeutic Goals:  1. Patient will identify specific grudges related to their personal life.  2. Patient will identify feelings, thoughts, and beliefs around grudges.  3. Patient will identify how one releases grudges appropriately.  4. Patient will identify situations where they could have let go of the grudge, but instead chose to hold on.   Summary of Patient Progress: The patient scored her mood 4 (10 best). She indicated she still hearing voices that are condemning her. Patient discussed her thoughts, feelings, and reasons as to why she has grudges. Patient shared that she tries not to hold any grudges. Patient was able to process the impact grudges have on daily life and identify thoughts and feelings related to holding grudges. The patient identified feelings and thoughts related to what life would look like without grudges. Patient fully participated in group discussion and was able to explored her own experiences, giving and receiving support, and processing  challenge from other group members.   Therapeutic Modalities:  Cognitive Behavioral Therapy  Solution Focused Therapy  Motivational Interviewing  Brief Therapy   Deveion Denz  [name], LCSW 01/10/2018 12:52 PM

## 2018-01-10 NOTE — Progress Notes (Signed)
[facility] MD Progress Note  01/10/2018 11:46 AM Lauren [name]  MRN:  [id number] Subjective:   Im doing fine" Pt in her room, reports depression and anxiety is better but still hearing voices telling her" people used me all my life". Pt reportedly suspicious to meds but took it.  Principal Problem: Schizoaffective disorder ([place]) Diagnosis:   Patient Active Problem List   Diagnosis Date Noted  . Schizoaffective disorder ([place]) [F25.9] 12/29/2017  . Morbid obesity ([place]) [E66.01] 12/09/2017  . Meningioma ([place]) [D32.9] 11/25/2017  . Atherosclerosis of aorta ([place]) [I70.0] 11/25/2017  . Calcification of coronary artery [I25.10, I25.84] 11/25/2017  . Schizophrenia ([place]) [F20.9] 11/04/2017  . Acid reflux [K21.9] 06/29/2015  . Diabetes mellitus type 2, controlled, without complications ([place]) [J50.0] 06/29/2015  . Cardiac murmur [R01.1] 06/29/2015  . Arthritis of knee, degenerative [M17.10] 06/28/2015  . Insomnia w/ sleep apnea [G47.00, G47.30] 03/21/2015  . Anxiety disorder [F41.9] 03/21/2015  . Hypertension [I10] 03/21/2015  . HLD (hyperlipidemia) [E78.5] 03/21/2015  . Urge incontinence [N39.41] 03/21/2015   Total Time spent with patient: 30 minutes  Past Psychiatric History: no ne winfo   Past Medical History:  Past Medical History:  Diagnosis Date  . Anxiety   . Apnea, sleep 02/02/2014  . Awareness of heartbeats 02/02/2014  . Breathlessness on exertion 02/02/2014  . Diabetes mellitus   . Encounter for pre-employment examination 06/29/2015  . Excessive sweating 07/05/2015  . Fibromyalgia   . GERD (gastroesophageal reflux disease)   . Gravida 1 10/26/2015   1.    . Heart murmur   . Herniated disc   . Hyperlipidemia   . Hypertension   . Itch of skin 10/26/2015  . Lack of bladder control   . Lung tumor   . Rheumatoid arthritis ([place])   . Schizoaffective disorder ([place])   . Screening for cervical cancer 07/29/2017  . Seizure [facility])    after brain surgery 2012. last seizure 2013!   [name] Sex counseling 10/26/2015  . Sleep apnea   . 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 Stiffness of both knees 06/22/2015    Past Surgical History:  Procedure Laterality Date  . BRAIN TUMOR EXCISION  2012   benign  . CARDIAC CATHETERIZATION  2008  . CESAREAN SECTION    . CYST REMOVAL HAND    . LUNG LOBECTOMY  1977   benign tumor  . 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 Heart disease Brother   . Depression Mother   . Heart attack Mother   . Stroke Mother   . Alcohol abuse Father   . Stroke Father   . Diabetes Sister   . Diabetes Sister   . Stomach cancer Sister   . Kidney disease Sister   . COPD Brother   . Lung cancer Brother   . Diabetes Brother    Family Psychiatric  History: no new info Social History:  Social History   Substance and Sexual Activity  Alcohol Use No  . Alcohol/week: 0.0 oz     Social History   Substance and Sexual Activity  Drug Use No    Social History   Socioeconomic History  . Marital status: Single    Spouse name: Not on file  . Number of children: Not on file  .  Years of education: Not on file  . Highest education level: Not on file  Occupational History  . Not on file  Social Needs  . Financial resource strain: Not hard at all  . Food insecurity:    Worry: Never true    Inability: Never true  . Transportation needs:    Medical: No    Non-medical: No  Tobacco Use  . Smoking status: Never Smoker  . Smokeless tobacco: Never Used  Substance and Sexual Activity  . Alcohol use: No    Alcohol/week: 0.0 oz  . Drug use: No  . Sexual activity: Not Currently  Lifestyle  . Physical activity:    Days per week: 0 days    Minutes per session: 0 min  . Stress: Only a little  Relationships  . Social  connections:    Talks on phone: Not on file    Gets together: Not on file    Attends religious service: More than 4 times per year    Active member of club or organization: No    Attends meetings of clubs or organizations: Never    Relationship status: Married  Other Topics Concern  . Not on file  Social History Narrative  . Not on file   Additional Social History:                         Sleep: Good  Appetite:  Fair  Current Medications: Current Facility-Administered Medications  Medication Dose Route Frequency Provider Last Rate Last Dose  . acetaminophen (TYLENOL) tablet 650 mg  650 mg Oral Q6H PRN [name], [name], MD   650 mg at 01/10/18 0816  . alum & mag hydroxide-simeth (MAALOX/MYLANTA) 200-200-20 MG/5ML suspension 30 mL  30 mL Oral Q4H PRN [name], [name], MD      . aspirin EC tablet 81 mg  81 mg Oral Daily [name], [name], MD   81 mg at 01/10/18 0809  . benztropine (COGENTIN) tablet 1 mg  1 mg Oral QHS McNew, Tyson Babinski, MD   1 mg at 01/09/18 2117  . diclofenac sodium (VOLTAREN) 1 % transdermal gel 4 g  4 g Topical QID PRN Jolene Schimke, MD   4 g at 01/04/18 1506  . haloperidol (HALDOL) tablet 5 mg  5 mg Oral QHS McNew, Tyson Babinski, MD   5 mg at 01/09/18 2117  . hydrochlorothiazide (HYDRODIURIL) tablet 25 mg  25 mg Oral Daily McNew, Tyson Babinski, MD   25 mg at 01/10/18 0809  . lisinopril (PRINIVIL,ZESTRIL) tablet 20 mg  20 mg Oral Daily [name], [name], MD   20 mg at 01/10/18 0810  . magnesium hydroxide (MILK OF MAGNESIA) suspension 30 mL  30 mL Oral Daily PRN [name], [name], MD      . metFORMIN (GLUCOPHAGE) tablet 500 mg  500 mg Oral BID WC [name], [name], MD   500 mg at 01/10/18 0809  . pantoprazole (PROTONIX) EC tablet 40 mg  40 mg Oral Daily [name], [name], MD   40 mg at 01/10/18 0810  . rosuvastatin (CRESTOR) tablet 40 mg  40 mg Oral q1800 [name], [name], MD   40 mg at 01/09/18 1707  . traZODone (DESYREL) tablet 50 mg  50 mg Oral QHS McNew, Tyson Babinski, MD   50 mg at  01/09/18 2117    Lab Results:  Results for orders placed or performed during the hospital encounter of 12/29/17 (from the past 48 hour(s))  Glucose, capillary     Status: Abnormal   Collection Time: 01/09/18  7:38 AM  Result Value Ref Range   Glucose-Capillary 106 (H) 65 - 99 mg/dL  Glucose, capillary     Status: Abnormal   Collection Time: 01/10/18  7:04 AM  Result Value Ref Range   Glucose-Capillary 121 (H) 65 - 99 mg/dL   Comment 1 Document in Chart   Glucose, capillary     Status: Abnormal   Collection Time: 01/10/18 11:29 AM  Result Value Ref Range   Glucose-Capillary 105 (H) 65 - 99 mg/dL    Blood Alcohol level:  Lab Results  Component Value Date   ETH <10 12/28/2017   ETH <10 86/76/7209    Metabolic Disorder Labs: Lab Results  Component Value Date   HGBA1C 6.4 (H) 12/30/2017   MPG 136.98 12/30/2017   MPG 131.24 11/05/2017   No results found for: PROLACTIN Lab Results  Component Value Date   CHOL 137 12/30/2017   TRIG 75 12/30/2017   HDL 49 12/30/2017   CHOLHDL 2.8 12/30/2017   VLDL 15 12/30/2017   LDLCALC 73 12/30/2017   LDLCALC 163 (H) 11/05/2017    Physical Findings: AIMS: Facial and Oral Movements Muscles of Facial Expression: None, normal Lips and Perioral Area: None, normal Jaw: None, normal Tongue: None, normal,Extremity Movements Upper (arms, wrists, hands, fingers): None, normal Lower (legs, knees, ankles, toes): None, normal, Trunk Movements Neck, shoulders, hips: None, normal, Overall Severity Severity of abnormal movements (highest score from questions above): None, normal Incapacitation due to abnormal movements: None, normal Patient's awareness of abnormal movements (rate only patient's report): No Awareness, Dental Status Current problems with teeth and/or dentures?: No Does patient usually wear dentures?: No  CIWA:    COWS:     Musculoskeletal: Strength & Muscle Tone: within normal limits Gait & Station: normal Patient leans:    Psychiatric Specialty Exam: Physical Exam  Nursing note and vitals reviewed.   ROS  Blood pressure 117/81, pulse 85, temperature 98.7 F (37.1 C), temperature source Oral, resp. rate 18, height 5' 2.8" (1.595 m), weight 106.1 kg (234 lb), SpO2 98 %.Body mass index is 41.72 kg/m.  General Appearance: Casual, obese  Eye Contact:  Good  Speech:  Clear and Coherent  Volume:  Normal  Mood:  Euthymic  Affect:  restricted  Thought Process:  Coherent and Goal Directed  Orientation:  Full (Time, Place, and Person)  Thought Content:  AH, suspicious to meds  Suicidal Thoughts:  No  Homicidal Thoughts:  No  Memory:  Immediate;   Fair  Judgement:  Fair  Insight:  Fair  Psychomotor Activity:  Normal  Concentration:  Concentration: Fair  Recall:  [employer] of Knowledge:  Fair  Language:  Fair  Akathisia:  No      Assets:  Resilience  ADL's:  Intact  Cognition:  WNL  Sleep:  Number of Hours: 7.15        Treatment Plan Summary: Daily contact with patient to assess and evaluate symptoms and progress in treatment and Medication management. Pt continue to be psychotic.  Cont haldol. Next Haldol decanoate injection due 5/21    Lenward Chancellor, MD 01/10/2018, 11:46 AM

## 2018-01-10 NOTE — Progress Notes (Signed)
Precautionary checks every 15 minutes for safety maintained, room free of safety hazards, patient sustains no injury or falls during this shift.

## 2018-01-10 NOTE — Plan of Care (Signed)
Patient is A&O x 4, ambulates the unit with a steady gait. Compliant with medication and meals. Blood sugars monitored this shift. Present in the milieu with appropriate peer interaction noted. Reports that she slept fair last night with the use of sleep aids. Appetite is good with normal energy level and poor concentration. Depression is rated a 2, hopelessness a 2 and anxiety is rated at a 3. Complained of knee pain and rated it a 5 on a 10 scale. PRN  medication administered with effective results.  Milieu remains safe with q 15 minute safety checks.

## 2018-01-11 LAB — GLUCOSE, CAPILLARY: Glucose-Capillary: 115 mg/dL — ABNORMAL HIGH (ref 65–99)

## 2018-01-11 MED ORDER — IBUPROFEN 600 MG PO TABS
600.0000 mg | ORAL_TABLET | Freq: Four times a day (QID) | ORAL | Status: DC | PRN
  Administered 2018-01-11 – 2018-01-12 (×2): 600 mg via ORAL
  Filled 2018-01-11 (×3): qty 1

## 2018-01-11 MED ORDER — IBUPROFEN 100 MG/5ML PO SUSP
600.0000 mg | Freq: Four times a day (QID) | ORAL | Status: DC | PRN
  Filled 2018-01-11: qty 30

## 2018-01-11 NOTE — Plan of Care (Signed)
Patient verbalizes understanding of the general information that's been provided to her and has not voiced any further questions or concerns at this time. Patient has the ability to manage health-related needs and has been working towards maintaining her clinical measurements within normal limits. Patient has remained free from infection and her diagnostic tests have improved. Patient has been free from any health-related complications thus far. Patient's overall level of comfort has improved and she has remained free from injury on the unit. Patient's risk for impaired skin integrity has decreased and she stated to this writer that she slept "pretty well" last night. Patient has the ability to cope and has been attending/participating in unit groups when appropriate without any issues. Patient has the ability to make decisions for herself and has been in compliance with her prescribed therapeutic/medication regimen. Patient rates her depression a "4/10" stating that she's feeling this way because "they said my application got accepted for an apartment when I get out of here and it's just hard to believe after everything I've been through". Patient denies SI/HI/AVH at this time, however, she states that she has "thought patterns" that tell her to "trust in God and everything will be alright". Patient states that her goal for today is to "talk to the doctor about my meds and see what he thinks about what I'm taking".  Patient also denies any signs/symptoms of anxiety. Patient has been demonstrating self-control on the unit and remains safe at this time.  Problem: Education: Goal: Knowledge of General Education information will improve Outcome: Progressing   Problem: Health Behavior/Discharge Planning: Goal: Ability to manage health-related needs will improve Outcome: Progressing   Problem: Coping: Goal: Level of anxiety will decrease Outcome: Progressing   Problem: Pain Managment: Goal: General  experience of comfort will improve Outcome: Progressing   Problem: Safety: Goal: Ability to remain free from injury will improve Outcome: Progressing   Problem: Skin Integrity: Goal: Risk for impaired skin integrity will decrease Outcome: Progressing   Problem: Education: Goal: Utilization of techniques to improve thought processes will improve Outcome: Progressing Goal: Knowledge of the prescribed therapeutic regimen will improve Outcome: Progressing   Problem: Activity: Goal: Interest or engagement in leisure activities will improve Outcome: Progressing Goal: Imbalance in normal sleep/wake cycle will improve Outcome: Progressing   Problem: Coping: Goal: Coping ability will improve Outcome: Progressing Goal: Will verbalize feelings Outcome: Progressing   Problem: Health Behavior/Discharge Planning: Goal: Ability to make decisions will improve Outcome: Progressing Goal: Compliance with therapeutic regimen will improve Outcome: Progressing   Problem: Role Relationship: Goal: Will demonstrate positive changes in social behaviors and relationships Outcome: Progressing   Problem: Safety: Goal: Ability to disclose and discuss suicidal ideas will improve Outcome: Progressing Goal: Ability to identify and utilize support systems that promote safety will improve Outcome: Progressing   Problem: Self-Concept: Goal: Will verbalize positive feelings about self Outcome: Progressing Goal: Level of anxiety will decrease Outcome: Progressing   Problem: Education: Goal: Knowledge of [place] General Education information/materials will improve Outcome: Progressing Goal: Emotional status will improve Outcome: Progressing Goal: Mental status will improve Outcome: Progressing Goal: Verbalization of understanding the information provided will improve Outcome: Progressing   Problem: Activity: Goal: Interest or engagement in activities will improve Outcome:  Progressing Goal: Sleeping patterns will improve Outcome: Progressing   Problem: Coping: Goal: Ability to verbalize frustrations and anger appropriately will improve Outcome: Progressing Goal: Ability to demonstrate self-control will improve Outcome: Progressing   Problem: Health Behavior/Discharge Planning: Goal: Identification of resources available  to assist in meeting health care needs will improve Outcome: Progressing Goal: Compliance with treatment plan for underlying cause of condition will improve Outcome: Progressing   Problem: Physical Regulation: Goal: Ability to maintain clinical measurements within normal limits will improve Outcome: Progressing   Problem: Safety: Goal: Periods of time without injury will increase Outcome: Progressing

## 2018-01-11 NOTE — Progress Notes (Signed)
D:Pt denies SI/HI/AVH. Pt is pleasant and cooperative, smiling often cheerful. Pt. Complaints of some knee pain this evening that was relieved with PRN medications.  Patient Interaction is engaging and appropriate. Pt. Reports being very happy about being able to visit with family today. Pt. Compliant with medications, but does continue to request to see the medication packaging labels before they are opened and given to the patient. Pt. Reports eating and sleeping good.   A: Q x 15 minute observation checks were completed for safety. Patient was provided with education. Patient was given scheduled medications. Patient  was encourage to attend groups, participate in unit activities and continue with plan of care. Pt. Chart and care plans reviewed. Pt. Given support and encouragement.    R:Patient is complaint with medication and unit procedures. Pt. Attends groups. Blood sugar levels monitored for safety for MD orders.             Precautionary checks every 15 minutes for safety maintained, room free of safety hazards, patient sustains no injury or falls during this shift.

## 2018-01-11 NOTE — Plan of Care (Signed)
Pt calm and cooperative. Pt denies SI/HI. Pt states she is feeling a little anxious thinking about transitioning from here to the outside. She wants everything to work out with her getting the apartment when she gets out. She also stated that she hopes the medications continue to work for her, so that she will not have to come back to the hospital. Pt compliant with medications. Pt's knee pain came down to a 3 from the ibuprofen she had earlier. Will continue to monitor.

## 2018-01-11 NOTE — [facility] Group Notes (Signed)
LCSW Group Therapy Note 01/11/2018 1:15pm  Type of Therapy and Topic: Group Therapy: Feelings Around Returning Home & Establishing a Supportive Framework and Supporting Oneself When Supports Not Available  Participation Level: Active  Description of Group:  Patients first processed thoughts and feelings about upcoming discharge. These included fears of upcoming changes, lack of change, new living environments, judgements and expectations from others and overall stigma of mental health issues. The group then discussed the definition of a supportive framework, what that looks and feels like, and how do to discern it from an unhealthy non-supportive network. The group identified different types of supports as well as what to do when your family/friends are less than helpful or unavailable  Therapeutic Goals  1. Patient will identify one healthy supportive network that they can use at discharge. 2. Patient will identify one factor of a supportive framework and how to tell it from an unhealthy network. 3. Patient able to identify one coping skill to use when they do not have positive supports from others. 4. Patient will demonstrate ability to communicate their needs through discussion and/or role plays.  Summary of Patient Progress:  The patient scored her mood at a 7 (10 best.) Pt engaged during group session. As patients processed their anxiety about discharge and described healthy supports patient shared she is "afraid" of leaving the hospital and having another crisis that will bring her back to the hospital.  Patients identified at least one self-care tool they were willing to use after discharge, read.   Therapeutic Modalities Cognitive Behavioral Therapy Motivational Interviewing   [place], LCSW 01/11/2018 12:58 PM

## 2018-01-11 NOTE — Progress Notes (Signed)
[facility] MD Progress Note  01/11/2018 12:49 PM Lauren [name]  MRN:  [id number] Subjective:   My knees hurts" Pt in dayroom, reports depression and anxiety is better but still hearing voices at times but better, reports chr knee pain not relieved by tylenol, will add ibuprofen. Pt taking meds Principal Problem: Schizoaffective disorder ([place]) Diagnosis:   Patient Active Problem List   Diagnosis Date Noted  . Schizoaffective disorder ([place]) [F25.9] 12/29/2017  . Morbid obesity ([place]) [E66.01] 12/09/2017  . Meningioma ([place]) [D32.9] 11/25/2017  . Atherosclerosis of aorta ([place]) [I70.0] 11/25/2017  . Calcification of coronary artery [I25.10, I25.84] 11/25/2017  . Schizophrenia ([place]) [F20.9] 11/04/2017  . Acid reflux [K21.9] 06/29/2015  . Diabetes mellitus type 2, controlled, without complications ([place]) [Z00.9] 06/29/2015  . Cardiac murmur [R01.1] 06/29/2015  . Arthritis of knee, degenerative [M17.10] 06/28/2015  . Insomnia w/ sleep apnea [G47.00, G47.30] 03/21/2015  . Anxiety disorder [F41.9] 03/21/2015  . Hypertension [I10] 03/21/2015  . HLD (hyperlipidemia) [E78.5] 03/21/2015  . Urge incontinence [N39.41] 03/21/2015   Total Time spent with patient: 30 minutes  Past Psychiatric History: no ne winfo   Past Medical History:  Past Medical History:  Diagnosis Date  . Anxiety   . Apnea, sleep 02/02/2014  . Awareness of heartbeats 02/02/2014  . Breathlessness on exertion 02/02/2014  . Diabetes mellitus   . Encounter for pre-employment examination 06/29/2015  . Excessive sweating 07/05/2015  . Fibromyalgia   . GERD (gastroesophageal reflux disease)   . Gravida 1 10/26/2015   1.    . Heart murmur   . Herniated disc   . Hyperlipidemia   . Hypertension   . Itch of skin 10/26/2015  . Lack of bladder control   . Lung tumor   . Rheumatoid arthritis ([place])   . Schizoaffective disorder ([place])   . Screening for cervical cancer 07/29/2017  . Seizure [facility])    after brain surgery 2012. last  seizure 2013!  [name] Sex counseling 10/26/2015  . Sleep apnea   . 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 Stiffness of both knees 06/22/2015    Past Surgical History:  Procedure Laterality Date  . BRAIN TUMOR EXCISION  2012   benign  . CARDIAC CATHETERIZATION  2008  . CESAREAN SECTION    . CYST REMOVAL HAND    . LUNG LOBECTOMY  1977   benign tumor  . 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 Heart disease Brother   . Depression Mother   . Heart attack Mother   . Stroke Mother   . Alcohol abuse Father   . Stroke Father   . Diabetes Sister   . Diabetes Sister   . Stomach cancer Sister   . Kidney disease Sister   . COPD Brother   . Lung cancer Brother   . Diabetes Brother    Family Psychiatric  History: no new info Social History:  Social History   Substance and Sexual Activity  Alcohol Use No  . Alcohol/week: 0.0 oz     Social History   Substance and Sexual Activity  Drug Use No    Social History   Socioeconomic History  . Marital status: Single    Spouse name: Not on file  . Number of children: Not on file  .  Years of education: Not on file  . Highest education level: Not on file  Occupational History  . Not on file  Social Needs  . Financial resource strain: Not hard at all  . Food insecurity:    Worry: Never true    Inability: Never true  . Transportation needs:    Medical: No    Non-medical: No  Tobacco Use  . Smoking status: Never Smoker  . Smokeless tobacco: Never Used  Substance and Sexual Activity  . Alcohol use: No    Alcohol/week: 0.0 oz  . Drug use: No  . Sexual activity: Not Currently  Lifestyle  . Physical activity:    Days per week: 0 days    Minutes per session: 0 min  . Stress: Only a little  Relationships  .  Social connections:    Talks on phone: Not on file    Gets together: Not on file    Attends religious service: More than 4 times per year    Active member of club or organization: No    Attends meetings of clubs or organizations: Never    Relationship status: Married  Other Topics Concern  . Not on file  Social History Narrative  . Not on file   Additional Social History:                         Sleep: Good  Appetite:  Fair  Current Medications: Current Facility-Administered Medications  Medication Dose Route Frequency Provider Last Rate Last Dose  . acetaminophen (TYLENOL) tablet 650 mg  650 mg Oral Q6H PRN [name], [name], MD   650 mg at 01/11/18 5400  . alum & mag hydroxide-simeth (MAALOX/MYLANTA) 200-200-20 MG/5ML suspension 30 mL  30 mL Oral Q4H PRN [name], [name], MD      . aspirin EC tablet 81 mg  81 mg Oral Daily [name], [name], MD   81 mg at 01/11/18 8676  . benztropine (COGENTIN) tablet 1 mg  1 mg Oral QHS McNew, Tyson Babinski, MD   1 mg at 01/10/18 2123  . diclofenac sodium (VOLTAREN) 1 % transdermal gel 4 g  4 g Topical QID PRN Jolene Schimke, MD   4 g at 01/04/18 1506  . haloperidol (HALDOL) tablet 5 mg  5 mg Oral QHS McNew, Holly R, MD   5 mg at 01/10/18 2123  . hydrochlorothiazide (HYDRODIURIL) tablet 25 mg  25 mg Oral Daily McNew, Tyson Babinski, MD   25 mg at 01/11/18 0820  . ibuprofen (ADVIL,MOTRIN) tablet 600 mg  600 mg Oral Q6H PRN Lenward Chancellor, MD      . lisinopril (PRINIVIL,ZESTRIL) tablet 20 mg  20 mg Oral Daily [name], [name], MD   20 mg at 01/11/18 0820  . magnesium hydroxide (MILK OF MAGNESIA) suspension 30 mL  30 mL Oral Daily PRN [name], [name], MD      . metFORMIN (GLUCOPHAGE) tablet 500 mg  500 mg Oral BID WC [name], [name], MD   500 mg at 01/11/18 0820  . pantoprazole (PROTONIX) EC tablet 40 mg  40 mg Oral Daily [name], [name], MD   40 mg at 01/11/18 0820  . rosuvastatin (CRESTOR) tablet 40 mg  40 mg Oral q1800 [name], [name], MD   40 mg at  01/10/18 1733  . traZODone (DESYREL) tablet 50 mg  50 mg Oral QHS Marylin Crosby, MD   50 mg at 01/10/18 2123  Lab Results:  Results for orders placed or performed during the hospital encounter of 12/29/17 (from the past 48 hour(s))  Glucose, capillary     Status: Abnormal   Collection Time: 01/10/18  7:04 AM  Result Value Ref Range   Glucose-Capillary 121 (H) 65 - 99 mg/dL   Comment 1 Document in Chart   Glucose, capillary     Status: Abnormal   Collection Time: 01/10/18 11:29 AM  Result Value Ref Range   Glucose-Capillary 105 (H) 65 - 99 mg/dL  Glucose, capillary     Status: Abnormal   Collection Time: 01/10/18  4:40 PM  Result Value Ref Range   Glucose-Capillary 114 (H) 65 - 99 mg/dL  Glucose, capillary     Status: Abnormal   Collection Time: 01/11/18  7:10 AM  Result Value Ref Range   Glucose-Capillary 115 (H) 65 - 99 mg/dL   Comment 1 Notify RN     Blood Alcohol level:  Lab Results  Component Value Date   ETH <10 12/28/2017   ETH <10 37/16/9678    Metabolic Disorder Labs: Lab Results  Component Value Date   HGBA1C 6.4 (H) 12/30/2017   MPG 136.98 12/30/2017   MPG 131.24 11/05/2017   No results found for: PROLACTIN Lab Results  Component Value Date   CHOL 137 12/30/2017   TRIG 75 12/30/2017   HDL 49 12/30/2017   CHOLHDL 2.8 12/30/2017   VLDL 15 12/30/2017   LDLCALC 73 12/30/2017   LDLCALC 163 (H) 11/05/2017    Physical Findings: AIMS: Facial and Oral Movements Muscles of Facial Expression: None, normal Lips and Perioral Area: None, normal Jaw: None, normal Tongue: None, normal,Extremity Movements Upper (arms, wrists, hands, fingers): None, normal Lower (legs, knees, ankles, toes): None, normal, Trunk Movements Neck, shoulders, hips: None, normal, Overall Severity Severity of abnormal movements (highest score from questions above): None, normal Incapacitation due to abnormal movements: None, normal Patient's awareness of abnormal movements (rate only  patient's report): No Awareness, Dental Status Current problems with teeth and/or dentures?: No Does patient usually wear dentures?: No  CIWA:    COWS:     Musculoskeletal: Strength & Muscle Tone: within normal limits Gait & Station: normal Patient leans:   Psychiatric Specialty Exam: Physical Exam  Nursing note and vitals reviewed.   ROS  Blood pressure 128/78, pulse 92, temperature 97.9 F (36.6 C), temperature source Oral, resp. rate 18, height 5' 2.8" (1.595 m), weight 106.1 kg (234 lb), SpO2 98 %.Body mass index is 41.72 kg/m.  General Appearance: Casual, obese  Eye Contact:  Good  Speech:  Clear and Coherent  Volume:  Normal  Mood:  Euthymic  Affect:  brighter  Thought Process:  Coherent and Goal Directed  Orientation:  Full (Time, Place, and Person)  Thought Content:  AH better, suspicious   Suicidal Thoughts:  No  Homicidal Thoughts:  No  Memory:  Immediate;   Fair  Judgement:  Fair  Insight:  Fair  Psychomotor Activity:  Normal  Concentration:  Concentration: Fair  Recall:  [employer] of Knowledge:  Fair  Language:  Fair  Akathisia:  No      Assets:  Resilience  ADL's:  Intact  Cognition:  WNL  Sleep:  Number of Hours: 7.15        Treatment Plan Summary: Daily contact with patient to assess and evaluate symptoms and progress in treatment and Medication management. Psychosis improving slowly.   Cont haldol. Next Haldol decanoate injection due 5/21 Add ibuprofen prn  for chr knee pain.     Lenward Chancellor, MD 01/11/2018, 12:49 PMPatient ID: [name], female   DOB: 10-20-1950, 67 y.o.   MRN: [id number]

## 2018-01-11 NOTE — Progress Notes (Signed)
D- Patient alert and oriented. Patient presents in a pleasant mood on assessment stating that she slept "pretty well" last night. Patient continues to endorse chronic pain in both knees rating her pain level a "5/10", and she did request medication from this Probation officer. When this writer asked patient about auditory hallucinations, patient states "they're thought patterns that's telling me to trust in God and everything will be alright". Patient denies SI, HI, VH as well as any signs/symptoms of anxiety at this time. Patient's goal for today is to "talk to the doctor about my meds and see what he thinks about what I'm taking".   A- Scheduled medications administered to patient, per MD orders. Support and encouragement provided.  Routine safety checks conducted every 15 minutes.  Patient informed to notify staff with problems or concerns.  R- No adverse drug reactions noted. Patient contracts for safety at this time. Patient compliant with medications and treatment plan. Patient receptive, calm, and cooperative. Patient interacts well with others on the unit.  Patient remains safe at this time.

## 2018-01-11 NOTE — Plan of Care (Signed)
Pt. Verbalizes understanding of provided education. Pt. Denies SI/HI. Pt. Verbally contracts for safety. Pt. Reports he can remain safe while on the unit. Pt. Participates in groups and unit activities. Pt. Compliant with medications.    Problem: Education: Goal: Knowledge of General Education information will improve Outcome: Progressing   Problem: Safety: Goal: Ability to remain free from injury will improve Outcome: Progressing   Problem: Activity: Goal: Interest or engagement in leisure activities will improve Outcome: Progressing   Problem: Health Behavior/Discharge Planning: Goal: Compliance with therapeutic regimen will improve Outcome: Progressing   Problem: Safety: Goal: Periods of time without injury will increase Outcome: Progressing

## 2018-01-12 LAB — GLUCOSE, CAPILLARY
Glucose-Capillary: 111 mg/dL — ABNORMAL HIGH (ref 65–99)
Glucose-Capillary: 113 mg/dL — ABNORMAL HIGH (ref 65–99)

## 2018-01-12 MED ORDER — HYDROCHLOROTHIAZIDE 25 MG PO TABS: 25.0000 mg | ORAL_TABLET | Freq: Every day | ORAL | 0 refills | Status: DC

## 2018-01-12 MED ORDER — TRAZODONE HCL 100 MG PO TABS: 100.0000 mg | ORAL_TABLET | Freq: Every day | ORAL | 0 refills | Status: DC

## 2018-01-12 MED ORDER — LISINOPRIL 20 MG PO TABS: 20.0000 mg | ORAL_TABLET | Freq: Every day | ORAL | 0 refills | Status: DC

## 2018-01-12 MED ORDER — ROSUVASTATIN CALCIUM 40 MG PO TABS: 40.0000 mg | ORAL_TABLET | Freq: Every day | ORAL | 0 refills | Status: DC

## 2018-01-12 MED ORDER — BENZTROPINE MESYLATE 1 MG PO TABS: 1.0000 mg | ORAL_TABLET | Freq: Every day | ORAL | 0 refills | Status: DC

## 2018-01-12 MED ORDER — METFORMIN HCL 500 MG PO TABS: 500.0000 mg | ORAL_TABLET | Freq: Two times a day (BID) | ORAL | 0 refills | Status: DC

## 2018-01-12 MED ORDER — METFORMIN HCL 500 MG PO TABS
500.0000 mg | ORAL_TABLET | Freq: Two times a day (BID) | ORAL | 0 refills | Status: DC
Start: 2018-01-12 — End: 2018-01-28

## 2018-01-12 MED ORDER — HALOPERIDOL 5 MG PO TABS: 5.0000 mg | ORAL_TABLET | Freq: Every day | ORAL | 0 refills | Status: DC

## 2018-01-12 MED ORDER — TRAZODONE HCL 50 MG PO TABS: 50.0000 mg | ORAL_TABLET | Freq: Every day | ORAL | 0 refills | Status: DC

## 2018-01-12 MED ORDER — HALOPERIDOL DECANOATE 100 MG/ML IM SOLN: 100.0000 mg | INTRAMUSCULAR | 0 refills | Status: DC

## 2018-01-12 MED ORDER — BENZTROPINE MESYLATE 1 MG PO TABS: 0.5000 mg | ORAL_TABLET | Freq: Every day | ORAL | 0 refills | Status: DC

## 2018-01-12 NOTE — [facility] Group Notes (Signed)
[name] Group Notes:  (Nursing/MHT/Case Management/Adjunct)  Date:  01/12/2018  Time:  9:06 PM  Type of Therapy:  Group Therapy  Participation Level:  Active  Participation Quality:  Appropriate  Affect:  Appropriate  Cognitive:  Appropriate  Insight:  Appropriate  Engagement in Group:  Engaged  Modes of Intervention:  Support  Summary of Progress/Problems:  Lauren [name] 01/12/2018, 9:06 PM

## 2018-01-12 NOTE — Plan of Care (Signed)
Rates depression and anxiety as 2/2.  Denies any SI/HI/AVH.  Continues to have some paranoid thoughts.  States "I feel as though someone has been messing with my car.  How can a brand new car need a new starter and a battery."  Support offered safety maintained.

## 2018-01-12 NOTE — Progress Notes (Signed)
[facility] MD Progress Note  01/12/2018 2:10 PM Lauren [name]  MRN:  [id number] Subjective:  Pt has bright affect today, She states that she is feeling ready to discharge tomorrow. She is an xious and nervous but excited to look at her new apartment and get set up with ACT team. She has been attended all groups. She is sleeping well and eating well. Organized in thoughts. Insight is much better and has been medication compliant. She complains of some mild muscle twitches at times but tolerable. She does not want to d/c oral Haldol yet because of fear "that I will take a step back. "  Principal Problem: Schizoaffective disorder ([place]) Diagnosis:   Patient Active Problem List   Diagnosis Date Noted  . Schizophrenia ([place]) [F20.9] 11/04/2017    Priority: High  . Schizoaffective disorder ([place]) [F25.9] 12/29/2017  . Morbid obesity ([place]) [E66.01] 12/09/2017  . Meningioma ([place]) [D32.9] 11/25/2017  . Atherosclerosis of aorta ([place]) [I70.0] 11/25/2017  . Calcification of coronary artery [I25.10, I25.84] 11/25/2017  . Acid reflux [K21.9] 06/29/2015  . Diabetes mellitus type 2, controlled, without complications ([place]) [J69.6] 06/29/2015  . Cardiac murmur [R01.1] 06/29/2015  . Arthritis of knee, degenerative [M17.10] 06/28/2015  . Insomnia w/ sleep apnea [G47.00, G47.30] 03/21/2015  . Anxiety disorder [F41.9] 03/21/2015  . Hypertension [I10] 03/21/2015  . HLD (hyperlipidemia) [E78.5] 03/21/2015  . Urge incontinence [N39.41] 03/21/2015   Total Time spent with patient: 20 minutes  Past Psychiatric History: See H&P  Past Medical History:  Past Medical History:  Diagnosis Date  . Anxiety   . Apnea, sleep 02/02/2014  . Awareness of heartbeats 02/02/2014  . Breathlessness on exertion 02/02/2014  . Diabetes mellitus   . Encounter for pre-employment examination 06/29/2015  . Excessive sweating 07/05/2015  . Fibromyalgia   . GERD (gastroesophageal reflux disease)   . Gravida 1 10/26/2015   1.    .  Heart murmur   . Herniated disc   . Hyperlipidemia   . Hypertension   . Itch of skin 10/26/2015  . Lack of bladder control   . Lung tumor   . Rheumatoid arthritis ([place])   . Schizoaffective disorder ([place])   . Screening for cervical cancer 07/29/2017  . Seizure [facility])    after brain surgery 2012. last seizure 2013!  [name] Sex counseling 10/26/2015  . Sleep apnea   . 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 Stiffness of both knees 06/22/2015    Past Surgical History:  Procedure Laterality Date  . BRAIN TUMOR EXCISION  2012   benign  . CARDIAC CATHETERIZATION  2008  . CESAREAN SECTION    . CYST REMOVAL HAND    . LUNG LOBECTOMY  1977   benign tumor  . 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 Heart disease Brother   . Depression Mother   . Heart attack Mother   . Stroke Mother   . Alcohol abuse Father   . Stroke Father   . Diabetes Sister   . Diabetes Sister   . Stomach cancer Sister   . Kidney disease Sister   . COPD Brother   . Lung cancer Brother   . Diabetes Brother    Family Psychiatric  History: See H&P Social History:  Social History   Substance  and Sexual Activity  Alcohol Use No  . Alcohol/week: 0.0 oz     Social History   Substance and Sexual Activity  Drug Use No    Social History   Socioeconomic History  . Marital status: Single    Spouse name: Not on file  . Number of children: Not on file  . Years of education: Not on file  . Highest education level: Not on file  Occupational History  . Not on file  Social Needs  . Financial resource strain: Not hard at all  . Food insecurity:    Worry: Never true    Inability: Never true  . Transportation needs:    Medical: No    Non-medical: No  Tobacco Use  . Smoking status: Never  Smoker  . Smokeless tobacco: Never Used  Substance and Sexual Activity  . Alcohol use: No    Alcohol/week: 0.0 oz  . Drug use: No  . Sexual activity: Not Currently  Lifestyle  . Physical activity:    Days per week: 0 days    Minutes per session: 0 min  . Stress: Only a little  Relationships  . Social connections:    Talks on phone: Not on file    Gets together: Not on file    Attends religious service: More than 4 times per year    Active member of club or organization: No    Attends meetings of clubs or organizations: Never    Relationship status: Married  Other Topics Concern  . Not on file  Social History Narrative  . Not on file   Additional Social History:                         Sleep: Good  Appetite:  Good  Current Medications: Current Facility-Administered Medications  Medication Dose Route Frequency Provider Last Rate Last Dose  . acetaminophen (TYLENOL) tablet 650 mg  650 mg Oral Q6H PRN [name], [name], MD   650 mg at 01/11/18 1245  . alum & mag hydroxide-simeth (MAALOX/MYLANTA) 200-200-20 MG/5ML suspension 30 mL  30 mL Oral Q4H PRN [name], [name], MD      . aspirin EC tablet 81 mg  81 mg Oral Daily [name], [name], MD   81 mg at 01/12/18 0808  . benztropine (COGENTIN) tablet 1 mg  1 mg Oral QHS Shama Monfils, Tyson Babinski, MD   1 mg at 01/11/18 2100  . diclofenac sodium (VOLTAREN) 1 % transdermal gel 4 g  4 g Topical QID PRN Jolene Schimke, MD   4 g at 01/04/18 1506  . haloperidol (HALDOL) tablet 5 mg  5 mg Oral QHS Annitta Fifield, Tyson Babinski, MD   5 mg at 01/11/18 2100  . hydrochlorothiazide (HYDRODIURIL) tablet 25 mg  25 mg Oral Daily Donzella Carrol, Tyson Babinski, MD   25 mg at 01/12/18 8099  . ibuprofen (ADVIL,MOTRIN) tablet 600 mg  600 mg Oral Q6H PRN Lenward Chancellor, MD   600 mg at 01/12/18 0808  . lisinopril (PRINIVIL,ZESTRIL) tablet 20 mg  20 mg Oral Daily [name], [name], MD   20 mg at 01/12/18 0808  . magnesium hydroxide (MILK OF MAGNESIA) suspension 30 mL  30 mL Oral Daily  PRN [name], [name], MD      . metFORMIN (GLUCOPHAGE) tablet 500 mg  500 mg Oral BID WC [name], [name], MD   500 mg at 01/12/18 8338  . pantoprazole (PROTONIX) EC tablet 40 mg  40  mg Oral Daily [name], [name], MD   40 mg at 01/12/18 0808  . rosuvastatin (CRESTOR) tablet 40 mg  40 mg Oral q1800 [name], [name], MD   40 mg at 01/11/18 1700  . traZODone (DESYREL) tablet 50 mg  50 mg Oral QHS Abdalrahman Clementson, Tyson Babinski, MD   50 mg at 01/11/18 2100    Lab Results:  Results for orders placed or performed during the hospital encounter of 12/29/17 (from the past 48 hour(s))  Glucose, capillary     Status: Abnormal   Collection Time: 01/10/18  4:40 PM  Result Value Ref Range   Glucose-Capillary 114 (H) 65 - 99 mg/dL  Glucose, capillary     Status: Abnormal   Collection Time: 01/11/18  7:10 AM  Result Value Ref Range   Glucose-Capillary 115 (H) 65 - 99 mg/dL   Comment 1 Notify RN   Glucose, capillary     Status: Abnormal   Collection Time: 01/12/18  7:36 AM  Result Value Ref Range   Glucose-Capillary 111 (H) 65 - 99 mg/dL   Comment 1 Notify RN   Glucose, capillary     Status: Abnormal   Collection Time: 01/12/18 11:17 AM  Result Value Ref Range   Glucose-Capillary 113 (H) 65 - 99 mg/dL   Comment 1 Notify RN     Blood Alcohol level:  Lab Results  Component Value Date   ETH <10 12/28/2017   ETH <10 44/31/5400    Metabolic Disorder Labs: Lab Results  Component Value Date   HGBA1C 6.4 (H) 12/30/2017   MPG 136.98 12/30/2017   MPG 131.24 11/05/2017   No results found for: PROLACTIN Lab Results  Component Value Date   CHOL 137 12/30/2017   TRIG 75 12/30/2017   HDL 49 12/30/2017   CHOLHDL 2.8 12/30/2017   VLDL 15 12/30/2017   LDLCALC 73 12/30/2017   LDLCALC 163 (H) 11/05/2017    Physical Findings: AIMS: Facial and Oral Movements Muscles of Facial Expression: None, normal Lips and Perioral Area: None, normal Jaw: None, normal Tongue: None, normal,Extremity Movements Upper (arms,  wrists, hands, fingers): None, normal Lower (legs, knees, ankles, toes): None, normal, Trunk Movements Neck, shoulders, hips: None, normal, Overall Severity Severity of abnormal movements (highest score from questions above): None, normal Incapacitation due to abnormal movements: None, normal Patient's awareness of abnormal movements (rate only patient's report): No Awareness, Dental Status Current problems with teeth and/or dentures?: No Does patient usually wear dentures?: No  CIWA:    COWS:     Musculoskeletal: Strength & Muscle Tone: within normal limits Gait & Station: normal Patient leans: N/A  Psychiatric Specialty Exam: Physical Exam  ROS  Blood pressure 132/84, pulse 83, temperature 98.1 F (36.7 C), temperature source Oral, resp. rate 17, height 5' 2.8" (1.595 m), weight 106.1 kg (234 lb), SpO2 98 %.Body mass index is 41.72 kg/m.  General Appearance: Casual  Eye Contact:  Good  Speech:  Clear and Coherent  Volume:  Normal  Mood:  Euthymic  Affect:  Appropriate  Thought Process:  Coherent and Goal Directed  Orientation:  Full (Time, Place, and Person)  Thought Content:  Logical  Suicidal Thoughts:  No  Homicidal Thoughts:  No  Memory:  Immediate;   Fair  Judgement:  Fair  Insight:  Fair  Psychomotor Activity:  Normal  Concentration:  Concentration: Fair  Recall:  [employer] of Knowledge:  Fair  Language:  Fair  Akathisia:  No      Assets:  Resilience  ADL's:  Intact  Cognition:  WNL  Sleep:  Number of Hours: 7.5     Treatment Plan Summary: 67 yo female admitted due to psychosis. Paranoia is much improved and she is at baseline. She is organized and goal directed. She has been interacting well with other peers. Affect is much brighter and insight is much better.   Plan:  Schizophrenia -She is on Haldol Decanoate 100 mg q 28 days. Next dose due 5/21 -She does not want to d/c oral Haldol yet -Continue Cogentin 1 mg qhs -Continue Trazodone 50 mg  qhs  HTN -Continue Lisinopril and HCTZ  Dispo -Likely discharge tomorrow with ACT team   Marylin Crosby, MD 01/12/2018, 2:10 PM

## 2018-01-12 NOTE — Progress Notes (Signed)
Recreation Therapy Notes  Date: 01/12/2018  Time: 9:30 am  Location: Craft Room  Behavioral response: Appropriate  Intervention Topic: Problem Solving  Discussion/Intervention:  Group content on today was focused on problem solving. The group described what problem solving is. Patients expressed how problems affect them and how they deal with problems. Individuals identified healthy ways to deal with problems. Patients explained what normally happens to them when they do not deal with problems. The group expressed reoccurring problems for them. The group participated in the intervention "Ways to Solve problems" where patients were given a chance to explore different ways to solve problems.  Clinical Observations/Feedback:  Patient came to group and defined problem solving as settling a matter. She stated that she normally confronts her problems face to face.Individual stated she normally does not not ask for help with her problems because she does not know who to go to for help. Patient described a time when she asked others for help and the took advantage of her and lets others know about her problem. She was social with peers and staff while participating in the intervention.  Nattie Lazenby LRT/CTRS          Mccall Lomax 01/12/2018 11:11 AM

## 2018-01-13 LAB — GLUCOSE, CAPILLARY: Glucose-Capillary: 100 mg/dL — ABNORMAL HIGH (ref 65–99)

## 2018-01-13 NOTE — Discharge Summary (Signed)
Physician Discharge Summary Note  Patient:  Lauren [name] is an 67 y.o., female MRN:  [id number] DOB:  March 09, 1951 Patient phone:  [phone] (home)  Patient address:   [place] [address],  Total Time spent with patient: 20 minutes  Plus 20 minutes of medication reconciliation, discharge planning, and discharge documentation   Date of Admission:  12/29/2017 Date of Discharge: 01/13/18  Reason for Admission:  Depression and paranoia  Principal Problem: Schizoaffective disorder [facility]) Discharge Diagnoses: Patient Active Problem List   Diagnosis Date Noted  . Schizophrenia ([place]) [F20.9] 11/04/2017    Priority: High  . Schizoaffective disorder ([place]) [F25.9] 12/29/2017  . Morbid obesity ([place]) [E66.01] 12/09/2017  . Meningioma ([place]) [D32.9] 11/25/2017  . Atherosclerosis of aorta ([place]) [I70.0] 11/25/2017  . Calcification of coronary artery [I25.10, I25.84] 11/25/2017  . Acid reflux [K21.9] 06/29/2015  . Diabetes mellitus type 2, controlled, without complications ([place]) [O75.6] 06/29/2015  . Cardiac murmur [R01.1] 06/29/2015  . Arthritis of knee, degenerative [M17.10] 06/28/2015  . Insomnia w/ sleep apnea [G47.00, G47.30] 03/21/2015  . Anxiety disorder [F41.9] 03/21/2015  . Hypertension [I10] 03/21/2015  . HLD (hyperlipidemia) [E78.5] 03/21/2015  . Urge incontinence [N39.41] 03/21/2015    Past Psychiatric History: See H&P  Past Medical History:  Past Medical History:  Diagnosis Date  . Anxiety   . Apnea, sleep 02/02/2014  . Awareness of heartbeats 02/02/2014  . Breathlessness on exertion 02/02/2014  . Diabetes mellitus   . Encounter for pre-employment examination 06/29/2015  . Excessive sweating 07/05/2015  . Fibromyalgia   . GERD (gastroesophageal reflux disease)   . Gravida 1 10/26/2015   1.    . Heart murmur   . Herniated disc   . Hyperlipidemia   . Hypertension   . Itch of skin 10/26/2015  . Lack of bladder control   . Lung tumor   .  Rheumatoid arthritis ([place])   . Schizoaffective disorder ([place])   . Screening for cervical cancer 07/29/2017  . Seizure [facility])    after brain surgery 2012. last seizure 2013!  [name] Sex counseling 10/26/2015  . Sleep apnea   . 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 Stiffness of both knees 06/22/2015    Past Surgical History:  Procedure Laterality Date  . BRAIN TUMOR EXCISION  2012   benign  . CARDIAC CATHETERIZATION  2008  . CESAREAN SECTION    . CYST REMOVAL HAND    . LUNG LOBECTOMY  1977   benign tumor  . 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 Heart disease Brother   . Depression Mother   . Heart attack Mother   . Stroke Mother   . Alcohol abuse Father   . Stroke Father   . Diabetes Sister   . Diabetes Sister   . Stomach cancer Sister   . Kidney disease Sister   . COPD Brother   . Lung cancer Brother   . Diabetes Brother    Family Psychiatric  History: See H&P Social History:  Social History   Substance and Sexual Activity  Alcohol Use No  . Alcohol/week: 0.0 oz     Social History   Substance and Sexual Activity  Drug Use No    Social History   Socioeconomic History  . Marital status:  Single    Spouse name: Not on file  . Number of children: Not on file  . Years of education: Not on file  . Highest education level: Not on file  Occupational History  . Not on file  Social Needs  . Financial resource strain: Not hard at all  . Food insecurity:    Worry: Never true    Inability: Never true  . Transportation needs:    Medical: No    Non-medical: No  Tobacco Use  . Smoking status: Never Smoker  . Smokeless tobacco: Never Used  Substance and Sexual Activity  . Alcohol use: No    Alcohol/week: 0.0 oz  . Drug use: No  . Sexual  activity: Not Currently  Lifestyle  . Physical activity:    Days per week: 0 days    Minutes per session: 0 min  . Stress: Only a little  Relationships  . Social connections:    Talks on phone: Not on file    Gets together: Not on file    Attends religious service: More than 4 times per year    Active member of club or organization: No    Attends meetings of clubs or organizations: Never    Relationship status: Married  Other Topics Concern  . Not on file  Social History Narrative  . Not on file    Hospital Course:  Pt was given Haldol decanoate injection 100 mg q28 day.  She did not want to immediately stop oral Haldol and given her paranoia this was agreed upon. We did eventually agree to lower oral dose to 5 mg daily and discussed plans to eventually stop oral all together. Psychotic symptoms improved significantly and paranoid was much improved to baseline. Her speech and thinking was much clearer. On day of discharge, she had bright affect, smiling, laughing and was much more trusting of staff. She states that she is excited about the new apartment but also nervous that it won't work out. She is also looking forward to working with ACT team. She had good knowledge of all her medications and plans to continue them. She has consistently denied SI or thoughts of self harm. Denies HI, AH, VH. She was organized and goal directed in conversation.  She was given 7 day supply of medication from the hospital.    Physical Findings: AIMS: Facial and Oral Movements Muscles of Facial Expression: None, normal Lips and Perioral Area: None, normal Jaw: None, normal Tongue: None, normal,Extremity Movements Upper (arms, wrists, hands, fingers): None, normal Lower (legs, knees, ankles, toes): None, normal, Trunk Movements Neck, shoulders, hips: None, normal, Overall Severity Severity of abnormal movements (highest score from questions above): None, normal Incapacitation due to abnormal movements:  None, normal Patient's awareness of abnormal movements (rate only patient's report): No Awareness, Dental Status Current problems with teeth and/or dentures?: No Does patient usually wear dentures?: No  CIWA:    COWS:     Musculoskeletal: Strength & Muscle Tone: within normal limits Gait & Station: normal Patient leans: N/A  Psychiatric Specialty Exam: Physical Exam  ROS  Blood pressure (!) 146/88, pulse 99, temperature 98.1 F (36.7 C), temperature source Oral, resp. rate 18, height 5' 2.8" (1.595 m), weight 106.1 kg (234 lb), SpO2 99 %.Body mass index is 41.72 kg/m.  General Appearance: Casual  Eye Contact:  Good  Speech:  Clear and Coherent  Volume:  Normal  Mood:  Euthymic  Affect:  Appropriate  Thought Process:  Coherent and Goal Directed  Orientation:  Full (Time, Place, and Person)  Thought Content:  Logical  Suicidal Thoughts:  No  Homicidal Thoughts:  No  Memory:  Immediate;   Fair  Judgement:  Fair  Insight:  Fair  Psychomotor Activity:  Normal  Concentration:  Concentration: Fair  Recall:  [place] of Knowledge:  Fair  Language:  Fair  Akathisia:  No      Assets:  Resilience  ADL's:  Intact  Cognition:  WNL  Sleep:  Number of Hours: 7.15     Have you used any form of tobacco in the last 30 days? (Cigarettes, Smokeless Tobacco, Cigars, and/or Pipes): No  Has this patient used any form of tobacco in the last 30 days? (Cigarettes, Smokeless Tobacco, Cigars, and/or Pipes) No  Blood Alcohol level:  Lab Results  Component Value Date   ETH <10 12/28/2017   ETH <10 51/10/5850    Metabolic Disorder Labs:  Lab Results  Component Value Date   HGBA1C 6.4 (H) 12/30/2017   MPG 136.98 12/30/2017   MPG 131.24 11/05/2017   No results found for: PROLACTIN Lab Results  Component Value Date   CHOL 137 12/30/2017   TRIG 75 12/30/2017   HDL 49 12/30/2017   CHOLHDL 2.8 12/30/2017   VLDL 15 12/30/2017   LDLCALC 73 12/30/2017   LDLCALC 163 (H) 11/05/2017     See Psychiatric Specialty Exam and Suicide Risk Assessment completed by Attending Physician prior to discharge.  Discharge destination:  Other:  ACT team  Is patient on multiple antipsychotic therapies at discharge:  No   Has Patient had three or more failed trials of antipsychotic monotherapy by history:  No  Recommended Plan for Multiple Antipsychotic Therapies: NA  Discharge Instructions    Increase activity slowly   Complete by:  As directed      Allergies as of 01/13/2018      Reactions   Percocet [oxycodone-acetaminophen] Diarrhea, Nausea And Vomiting, Nausea Only   Tramadol Hcl Diarrhea, Nausea And Vomiting, Nausea Only   Vicodin [hydrocodone-acetaminophen] Diarrhea, Nausea And Vomiting, Nausea Only      Medication List    STOP taking these medications   Lotion Base Lotn   Vitamin C Chew   ZOSTRIX EX     TAKE these medications     Indication  aspirin EC 81 MG tablet Take 81 mg by mouth daily before breakfast.  Indication:  Inflammation   benztropine 1 MG tablet Commonly known as:  COGENTIN Take 1 tablet (1 mg total) by mouth at bedtime. What changed:    medication strength  how much to take  Indication:  Extrapyramidal Reaction caused by Medications   haloperidol 5 MG tablet Commonly known as:  HALDOL Take 1 tablet (5 mg total) by mouth at bedtime. What changed:    medication strength  how much to take  Indication:  Schizophrenia   haloperidol decanoate 100 MG/ML injection Commonly known as:  HALDOL DECANOATE Inject 1 mL (100 mg total) into the muscle every 28 (twenty-eight) days. Next injection due on 01/27/18 Start taking on:  01/27/2018 What changed:    how much to take  additional instructions  Indication:  Schizophrenia   hydrochlorothiazide 25 MG tablet Commonly known as:  HYDRODIURIL Take 1 tablet (25 mg total) by mouth daily.  Indication:  High Blood Pressure Disorder   ibuprofen 200 MG tablet Commonly known as:   ADVIL,MOTRIN Take 600 mg by mouth every 8 (eight) hours as needed (for knee pain.).  Indication:  Inflammation  lisinopril 20 MG tablet Commonly known as:  PRINIVIL,ZESTRIL Take 1 tablet (20 mg total) by mouth daily.  Indication:  High Blood Pressure Disorder   metFORMIN 500 MG tablet Commonly known as:  GLUCOPHAGE Take 1 tablet (500 mg total) by mouth 2 (two) times daily with a meal.  Indication:  Type 2 Diabetes   omeprazole 20 MG capsule Commonly known as:  PRILOSEC TAKE 1 CAPSULE BY MOUTH  DAILY  Indication:  Gastroesophageal Reflux Disease   rosuvastatin 40 MG tablet Commonly known as:  CRESTOR Take 1 tablet (40 mg total) by mouth daily.  Indication:  High Amount of Fats in the Blood   traZODone 50 MG tablet Commonly known as:  DESYREL Take 1 tablet (50 mg total) by mouth at bedtime. What changed:    medication strength  how much to take  when to take this  reasons to take this  Indication:  [place]. Follow up on 01/13/2018.   Why:  You will meet with Thressa Sheller with the ACT Team at the office on 01/13/18 at 11:00AM. Contact information: [address] [place] [address] [phone]           Follow-up recommendations:  Follow up with ACT team. She will also need to reschedule appointment with neurosurgery at [facility] to monitor meningioma (she had canceled the previous appointment that was made for her)  Signed: Marylin Crosby, MD 01/13/2018, 9:11 AM

## 2018-01-13 NOTE — Progress Notes (Signed)
Recreation Therapy Notes  INPATIENT RECREATION TR PLAN  Patient Details Name: Lauren [name] MRN: [id number] DOB: Mar 24, 1951 Today's Date: 01/13/2018  Rec Therapy Plan Is patient appropriate for Therapeutic Recreation?: Yes Treatment times per week: at least 3 Estimated Length of Stay: 5-7 days TR Treatment/Interventions: Group participation (Comment)  Discharge Criteria Pt will be discharged from therapy if:: Discharged Treatment plan/goals/alternatives discussed and agreed upon by:: Patient/family  Discharge Summary Short term goals set: Patient will identify 3 positive coping skills to decrease depressive symptoms within 5 recreation therapy group sessions Short term goals met: Complete Progress toward goals comments: Groups attended Which groups?: Leisure education, Other (Comment)(Relaxation, Emotions, Values, Creative Expression, Happiness, Problem Solving) Reason goals not met: N/A Therapeutic equipment acquired: N/A Reason patient discharged from therapy: Discharge from hospital Pt/family agrees with progress & goals achieved: Yes Date patient discharged from therapy: 01/13/18   Kwadwo Taras 01/13/2018, 1:45 PM

## 2018-01-13 NOTE — B[facility] Group Notes (Signed)
CSW Group Therapy Note  01/13/2018  Time:  0900  Type of Therapy and Topic: Group Therapy: Goals Group: SMART Goals    Participation Level:  Active    Description of Group:   The purpose of a daily goals group is to assist and guide patients in setting recovery/wellness-related goals. The objective is to set goals as they relate to the crisis in which they were admitted. Patients will be using SMART goal modalities to set measurable goals. Characteristics of realistic goals will be discussed and patients will be assisted in setting and processing how one will reach their goal. Facilitator will also assist patients in applying interventions and coping skills learned in psycho-education groups to the SMART goal and process how one will achieve defined goal.    Therapeutic Goals:  -Patients will develop and document one goal related to or their crisis in which brought them into treatment.  -Patients will be guided by LCSW using SMART goal setting modality in how to set a measurable, attainable, realistic and time sensitive goal.  -Patients will process barriers in reaching goal.  -Patients will process interventions in how to overcome and successful in reaching goal.    Patient's Goal:  Pt continues to work towards their tx goals but has not yet reached them. Pt was able to appropriately participate in group discussion, and was able to offer support/validation to other group members. Pt reported her goal for the day is, "to discharge and then to do at least three errands by the end of the day."   Therapeutic Modalities:  Motivational Interviewing  Cognitive Behavioral Therapy  Crisis Intervention Model  SMART goals setting  Alden Hipp, MSW, LCSW Clinical Social Worker 01/13/2018 10:03 AM

## 2018-01-13 NOTE — Progress Notes (Signed)
Recreation Therapy Notes   Date: 01/13/2018  Time: 9:30 am  Location: Craft Room  Behavioral response: Appropriate  Intervention Topic: Happiness  Discussion/Intervention:  Group content today was focused on Happiness. The group defined happiness and stated reasons they are and are not happy at times. Participants identified reasons they are normally happy and why. Individuals expressed how not being happy affects themselves and others. Patients stated reasons why happiness is important to them. The group described how they feel when they are happy. Individuals participated in the intervention "What is happiness" where they defined what happiness means to them.  Clinical Observations/Feedback:  Patient came to group and described happiness as having a home and food to eat. Individual was pulled from group to be discharged. She was social with peers and staff while in group and participated in the intervention.  Jenness Stemler LRT/CTRS         Kynsley Whitehouse 01/13/2018 1:30 PM

## 2018-01-13 NOTE — Progress Notes (Signed)
Patient discharged on above date and time. Alert and oriented x 4. Affect/mood pleasant and cooperative. Verbalizes excitement of leaving and going to her new apartment. Will follow up with [facility]. Transported home via [employer]. Left home with 7 day supply of her medications, Rx's, discharge paperwork and personal items. Denies SWI/HI/AVH and pain at this time.

## 2018-01-13 NOTE — BHH Suicide Risk Assessment (Signed)
[facility] Discharge Suicide Risk Assessment   Principal Problem: Schizoaffective disorder [facility]) Discharge Diagnoses:  Patient Active Problem List   Diagnosis Date Noted  . Schizophrenia ([place]) [F20.9] 11/04/2017    Priority: High  . Schizoaffective disorder ([place]) [F25.9] 12/29/2017  . Morbid obesity ([place]) [E66.01] 12/09/2017  . Meningioma ([place]) [D32.9] 11/25/2017  . Atherosclerosis of aorta ([place]) [I70.0] 11/25/2017  . Calcification of coronary artery [I25.10, I25.84] 11/25/2017  . Acid reflux [K21.9] 06/29/2015  . Diabetes mellitus type 2, controlled, without complications ([place]) [B09.6] 06/29/2015  . Cardiac murmur [R01.1] 06/29/2015  . Arthritis of knee, degenerative [M17.10] 06/28/2015  . Insomnia w/ sleep apnea [G47.00, G47.30] 03/21/2015  . Anxiety disorder [F41.9] 03/21/2015  . Hypertension [I10] 03/21/2015  . HLD (hyperlipidemia) [E78.5] 03/21/2015  . Urge incontinence [N39.41] 03/21/2015    Mental Status Per Nursing Assessment::   On Admission:     Demographic Factors:  Age 55 or older, Living alone and Unemployed  Loss Factors: Financial problems/change in socioeconomic status  Historical Factors: NA  Risk Reduction Factors:   Positive social support, Positive therapeutic relationship and Positive coping skills or problem solving skills  Continued Clinical Symptoms:  None  Cognitive Features That Contribute To Risk:  None    Suicide Risk:  Minimal: No identifiable suicidal ideation.   Follow-up [place]. Follow up on 01/13/2018.   Why:  You will meet with Thressa Sheller with the ACT Team at the office on 01/13/18 at 11:00AM. Contact information: [place] [address] [phone]           Plan Of Care/Follow-up recommendations: Follow up with [facility], MD 01/13/2018, 9:09 AM

## 2018-01-28 ENCOUNTER — Encounter: Payer: Self-pay | Admitting: Nurse Practitioner

## 2018-01-28 ENCOUNTER — Ambulatory Visit (INDEPENDENT_AMBULATORY_CARE_PROVIDER_SITE_OTHER): Payer: Medicare Other | Admitting: Nurse Practitioner

## 2018-01-28 VITALS — BP 138/86 | HR 96 | Temp 98.2°F | Resp 16 | Ht 66.0 in | Wt 235.9 lb

## 2018-01-28 DIAGNOSIS — E7800 Pure hypercholesterolemia, unspecified: Secondary | ICD-10-CM | POA: Diagnosis not present

## 2018-01-28 DIAGNOSIS — Z09 Encounter for follow-up examination after completed treatment for conditions other than malignant neoplasm: Secondary | ICD-10-CM

## 2018-01-28 DIAGNOSIS — F419 Anxiety disorder, unspecified: Secondary | ICD-10-CM | POA: Diagnosis not present

## 2018-01-28 DIAGNOSIS — K219 Gastro-esophageal reflux disease without esophagitis: Secondary | ICD-10-CM | POA: Diagnosis not present

## 2018-01-28 DIAGNOSIS — Z6838 Body mass index (BMI) 38.0-38.9, adult: Secondary | ICD-10-CM

## 2018-01-28 DIAGNOSIS — D329 Benign neoplasm of meninges, unspecified: Secondary | ICD-10-CM | POA: Diagnosis not present

## 2018-01-28 DIAGNOSIS — F200 Paranoid schizophrenia: Secondary | ICD-10-CM

## 2018-01-28 DIAGNOSIS — E119 Type 2 diabetes mellitus without complications: Secondary | ICD-10-CM | POA: Diagnosis not present

## 2018-01-28 DIAGNOSIS — I10 Essential (primary) hypertension: Secondary | ICD-10-CM | POA: Diagnosis not present

## 2018-01-28 MED ORDER — ROSUVASTATIN CALCIUM 40 MG PO TABS: 40.0000 mg | ORAL_TABLET | Freq: Every day | ORAL | 0 refills | Status: DC

## 2018-01-28 MED ORDER — METFORMIN HCL 500 MG PO TABS: 500.0000 mg | ORAL_TABLET | Freq: Two times a day (BID) | ORAL | 0 refills | Status: DC

## 2018-01-28 MED ORDER — LISINOPRIL 20 MG PO TABS: 20.0000 mg | ORAL_TABLET | Freq: Every day | ORAL | 0 refills | Status: DC

## 2018-01-28 MED ORDER — OMEPRAZOLE 20 MG PO CPDR: 20.0000 mg | DELAYED_RELEASE_CAPSULE | Freq: Every day | ORAL | 0 refills | Status: DC

## 2018-01-28 MED ORDER — LISINOPRIL 20 MG PO TABS
20.0000 mg | ORAL_TABLET | Freq: Every day | ORAL | 0 refills | Status: DC
Start: 2018-01-28 — End: 2018-04-01

## 2018-01-28 MED ORDER — HYDROCHLOROTHIAZIDE 25 MG PO TABS: 25.0000 mg | ORAL_TABLET | Freq: Every day | ORAL | 0 refills | Status: DC

## 2018-01-28 MED ORDER — METFORMIN HCL 500 MG PO TABS
500.0000 mg | ORAL_TABLET | Freq: Two times a day (BID) | ORAL | 0 refills | Status: DC
Start: 2018-01-28 — End: 2018-04-01

## 2018-01-28 NOTE — Addendum Note (Signed)
Addended by: Fredderick Severance on: 01/28/2018 02:32 PM   Modules accepted: Orders

## 2018-01-28 NOTE — Patient Instructions (Signed)
- Great job on your [employer] loss! Keep it up!    DASH Eating Plan DASH stands for "Dietary Approaches to Stop Hypertension." The DASH eating plan is a healthy eating plan that has been shown to reduce high blood pressure (hypertension). It may also reduce your risk for type 2 diabetes, heart disease, and stroke. The DASH eating plan may also help with weight loss. What are tips for following this plan? General guidelines  Avoid eating more than 2,300 mg (milligrams) of salt (sodium) a day. If you have hypertension, you may need to reduce your sodium intake to 1,500 mg a day.  Limit alcohol intake to no more than 1 drink a day for nonpregnant women and 2 drinks a day for men. One drink equals 12 oz of beer, 5 oz of wine, or 1 oz of hard liquor.  Work with your health care provider to maintain a healthy body weight or to lose weight. Ask what an ideal weight is for you.  Get at least 30 minutes of exercise that causes your heart to beat faster (aerobic exercise) most days of the week. Activities may include walking, swimming, or biking.  Work with your health care provider or diet and nutrition specialist (dietitian) to adjust your eating plan to your individual calorie needs. Reading food labels  Check food labels for the amount of sodium per serving. Choose foods with less than 5 percent of the Daily Value of sodium. Generally, foods with less than 300 mg of sodium per serving fit into this eating plan.  To find whole grains, look for the word "whole" as the first word in the ingredient list. Shopping  Buy products labeled as "low-sodium" or "no salt added."  Buy fresh foods. Avoid canned foods and premade or frozen meals. Cooking  Avoid adding salt when cooking. Use salt-free seasonings or herbs instead of table salt or sea salt. Check with your health care provider or pharmacist before using salt substitutes.  Do not fry foods. Cook foods using healthy methods such as baking,  boiling, grilling, and broiling instead.  Cook with heart-healthy oils, such as olive, canola, soybean, or sunflower oil. Meal planning   Eat a balanced diet that includes: ? 5 or more servings of fruits and vegetables each day. At each meal, try to fill half of your plate with fruits and vegetables. ? Up to 6-8 servings of whole grains each day. ? Less than 6 oz of lean meat, poultry, or fish each day. A 3-oz serving of meat is about the same size as a deck of cards. One egg equals 1 oz. ? 2 servings of low-fat dairy each day. ? A serving of nuts, seeds, or beans 5 times each week. ? Heart-healthy fats. Healthy fats called Omega-3 fatty acids are found in foods such as flaxseeds and coldwater fish, like sardines, salmon, and mackerel.  Limit how much you eat of the following: ? Canned or prepackaged foods. ? Food that is high in trans fat, such as fried foods. ? Food that is high in saturated fat, such as fatty meat. ? Sweets, desserts, sugary drinks, and other foods with added sugar. ? Full-fat dairy products.  Do not salt foods before eating.  Try to eat at least 2 vegetarian meals each week.  Eat more home-cooked food and less restaurant, buffet, and fast food.  When eating at a restaurant, ask that your food be prepared with less salt or no salt, if possible. What foods are recommended? The items  listed may not be a complete list. Talk with your dietitian about what dietary choices are best for you. Grains Whole-grain or whole-wheat bread. Whole-grain or whole-wheat pasta. Brown rice. [place]. Bulgur. Whole-grain and low-sodium cereals. Pita bread. Low-fat, low-sodium crackers. Whole-wheat flour tortillas. Vegetables Fresh or frozen vegetables (raw, steamed, roasted, or grilled). Low-sodium or reduced-sodium tomato and vegetable juice. Low-sodium or reduced-sodium tomato sauce and tomato paste. Low-sodium or reduced-sodium canned vegetables. Fruits All fresh, dried, or  frozen fruit. Canned fruit in natural juice (without added sugar). Meat and other protein foods Skinless chicken or [place]. Ground chicken or [place]. Pork with fat trimmed off. Fish and seafood. Egg whites. Dried beans, peas, or lentils. Unsalted nuts, nut butters, and seeds. Unsalted canned beans. Lean cuts of beef with fat trimmed off. Low-sodium, lean deli meat. Dairy Low-fat (1%) or fat-free (skim) milk. Fat-free, low-fat, or reduced-fat cheeses. Nonfat, low-sodium ricotta or cottage cheese. Low-fat or nonfat yogurt. Low-fat, low-sodium cheese. Fats and oils Soft margarine without trans fats. Vegetable oil. Low-fat, reduced-fat, or light mayonnaise and salad dressings (reduced-sodium). Canola, safflower, olive, soybean, and sunflower oils. Avocado. Seasoning and other foods Herbs. Spices. Seasoning mixes without salt. Unsalted popcorn and pretzels. Fat-free sweets. What foods are not recommended? The items listed may not be a complete list. Talk with your dietitian about what dietary choices are best for you. Grains Baked goods made with fat, such as croissants, muffins, or some breads. Dry pasta or rice meal packs. Vegetables Creamed or fried vegetables. Vegetables in a cheese sauce. Regular canned vegetables (not low-sodium or reduced-sodium). Regular canned tomato sauce and paste (not low-sodium or reduced-sodium). Regular tomato and vegetable juice (not low-sodium or reduced-sodium). Angie Fava. Olives. Fruits Canned fruit in a light or heavy syrup. Fried fruit. Fruit in cream or butter sauce. Meat and other protein foods Fatty cuts of meat. Ribs. Fried meat. Berniece Salines. Sausage. Bologna and other processed lunch meats. Salami. Fatback. Hotdogs. Bratwurst. Salted nuts and seeds. Canned beans with added salt. Canned or smoked fish. Whole eggs or egg yolks. Chicken or [place] with skin. Dairy Whole or 2% milk, cream, and half-and-half. Whole or full-fat cream cheese. Whole-fat or sweetened yogurt.  Full-fat cheese. Nondairy creamers. Whipped toppings. Processed cheese and cheese spreads. Fats and oils Butter. Stick margarine. Lard. Shortening. Ghee. Bacon fat. Tropical oils, such as coconut, palm kernel, or palm oil. Seasoning and other foods Salted popcorn and pretzels. Onion salt, garlic salt, seasoned salt, table salt, and sea salt. Worcestershire sauce. Tartar sauce. Barbecue sauce. Teriyaki sauce. Soy sauce, including reduced-sodium. Steak sauce. Canned and packaged gravies. Fish sauce. Oyster sauce. Cocktail sauce. Horseradish that you find on the shelf. Ketchup. Mustard. Meat flavorings and tenderizers. Bouillon cubes. Hot sauce and Tabasco sauce. Premade or packaged marinades. Premade or packaged taco seasonings. Relishes. Regular salad dressings. Where to find more information:  National Heart, Lung, and [place]: https://wilson-eaton.com/  American Heart Association: www.heart.org Summary  The DASH eating plan is a healthy eating plan that has been shown to reduce high blood pressure (hypertension). It may also reduce your risk for type 2 diabetes, heart disease, and stroke.  With the DASH eating plan, you should limit salt (sodium) intake to 2,300 mg a day. If you have hypertension, you may need to reduce your sodium intake to 1,500 mg a day.  When on the DASH eating plan, aim to eat more fresh fruits and vegetables, whole grains, lean proteins, low-fat dairy, and heart-healthy fats.  Work with your health care provider or  diet and nutrition specialist (dietitian) to adjust your eating plan to your individual calorie needs. This information is not intended to replace advice given to you by your health care provider. Make sure you discuss any questions you have with your health care provider. Document Released: 08/15/2011 Document Revised: 08/19/2016 Document Reviewed: 08/19/2016 Elsevier Interactive Patient Education  [employer].

## 2018-01-28 NOTE — Progress Notes (Addendum)
Name: Lauren [name]   MRN: [id number]    DOB: 05/13/1951   Date:01/28/2018       Progress Note  Subjective  Chief Complaint  Chief Complaint  Patient presents with  . Hospitalization Follow-up  . Depression    and nerves    HPI  Patient admitted to behavioral health on 4/22 after voluntary presented to ED with sister for anxiety and depression with some acknowledged visual hallucinations and hx of schizophrenia without SI after being out of haldol 2-3 weeks. Pt discharged on 01/13/18 with plan for 28day haldol injection with additional daily haldol, follow up with ACT team and recommendation for patient to reschedule appointment with neurosurgery at [facility] to monitor meningioma.  Patient states been doing well since she left the hospital. Aware she cant concentrate on the past but only live for the future. Exercising her knees and reading books. Patient is living in an apartment- going to the food bank for meals and is no longer homeless and has had help getting food and is grateful. Follows up with the ACT team- come over twice a week. Saw the counselor yesterday. Patient is taking the haldol every day, due for her haldol shot- the team will come to her ger on the 21st to give it. Denies SI/HI, hallucinations.   Patient states does not want to call neurosurgery- they had called her about appointment but was busy and does not want to deal with that right now. States even if the tumor had come back she doesn't want anything to be done. Denies headaches, blurry vision, paresthesia.   Lab Results  Component Value Date   CHOL 137 12/30/2017   HDL 49 12/30/2017   LDLCALC 73 12/30/2017   TRIG 75 12/30/2017   CHOLHDL 2.8 12/30/2017   Lab Results  Component Value Date   HGBA1C 6.4 (H) 12/30/2017   Wt Readings from Last 3 Encounters:  01/28/18 235 lb 14.4 oz (107 kg)  12/29/17 234 lb (106.1 kg)  12/28/17 240 lb (108.9 kg)   Patient has been eating less and staying more active.    Patient Active Problem List   Diagnosis Date Noted  . Schizoaffective disorder ([place]) 12/29/2017  . Morbid obesity ([place]) 12/09/2017  . Meningioma ([place]) 11/25/2017  . Atherosclerosis of aorta ([place]) 11/25/2017  . Calcification of coronary artery 11/25/2017  . Schizophrenia ([place]) 11/04/2017  . Acid reflux 06/29/2015  . Diabetes mellitus type 2, controlled, without complications ([place]) 35/00/9381  . Cardiac murmur 06/29/2015  . Arthritis of knee, degenerative 06/28/2015  . Insomnia w/ sleep apnea 03/21/2015  . Anxiety disorder 03/21/2015  . Hypertension 03/21/2015  . HLD (hyperlipidemia) 03/21/2015  . Urge incontinence 03/21/2015    Past Medical History:  Diagnosis Date  . Anxiety   . Apnea, sleep 02/02/2014  . Awareness of heartbeats 02/02/2014  . Breathlessness on exertion 02/02/2014  . Diabetes mellitus   . Encounter for pre-employment examination 06/29/2015  . Excessive sweating 07/05/2015  . Fibromyalgia   . GERD (gastroesophageal reflux disease)   . Gravida 1 10/26/2015   1.    . Heart murmur   . Herniated disc   . Hyperlipidemia   . Hypertension   . Itch of skin 10/26/2015  . Lack of bladder control   . Lung tumor   . Rheumatoid arthritis ([place])   . Schizoaffective disorder ([place])   . Screening for cervical cancer 07/29/2017  . Seizure [facility])    after brain surgery 2012. last seizure 2013!  [name] Sex counseling  10/26/2015  . Sleep apnea   . 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 Stiffness of both knees 06/22/2015    Past Surgical History:  Procedure Laterality Date  . BRAIN TUMOR EXCISION  2012   benign  . CARDIAC CATHETERIZATION  2008  . CESAREAN SECTION    . CYST REMOVAL HAND    . LUNG LOBECTOMY  1977   benign tumor  . TOTAL KNEE ARTHROPLASTY Left 01/28/2017   Procedure: TOTAL KNEE ARTHROPLASTY;  Surgeon: Corky Mull, MD;  Location: [facility] ORS;  Service: Orthopedics;  Laterality: Left;    Social History   Tobacco Use  . Smoking  status: Never Smoker  . Smokeless tobacco: Never Used  Substance Use Topics  . Alcohol use: No    Alcohol/week: 0.0 oz     Current Outpatient Medications:  .  aspirin EC 81 MG tablet, Take 81 mg by mouth daily before breakfast. , Disp: , Rfl:  .  benztropine (COGENTIN) 1 MG tablet, Take 1 tablet (1 mg total) by mouth at bedtime., Disp: 30 tablet, Rfl: 0 .  haloperidol (HALDOL) 5 MG tablet, Take 1 tablet (5 mg total) by mouth at bedtime., Disp: 30 tablet, Rfl: 0 .  haloperidol decanoate (HALDOL DECANOATE) 100 MG/ML injection, Inject 1 mL (100 mg total) into the muscle every 28 (twenty-eight) days. Next injection due on 01/27/18, Disp: 1 mL, Rfl: 0 .  hydrochlorothiazide (HYDRODIURIL) 25 MG tablet, Take 1 tablet (25 mg total) by mouth daily., Disp: 30 tablet, Rfl: 0 .  ibuprofen (ADVIL,MOTRIN) 200 MG tablet, Take 600 mg by mouth every 8 (eight) hours as needed (for knee pain.)., Disp: , Rfl:  .  lisinopril (PRINIVIL,ZESTRIL) 20 MG tablet, Take 1 tablet (20 mg total) by mouth daily., Disp: 30 tablet, Rfl: 0 .  metFORMIN (GLUCOPHAGE) 500 MG tablet, Take 1 tablet (500 mg total) by mouth 2 (two) times daily with a meal., Disp: 60 tablet, Rfl: 0 .  omeprazole (PRILOSEC) 20 MG capsule, TAKE 1 CAPSULE BY MOUTH  DAILY, Disp: 90 capsule, Rfl: 0 .  rosuvastatin (CRESTOR) 40 MG tablet, Take 1 tablet (40 mg total) by mouth daily., Disp: 30 tablet, Rfl: 0 .  traZODone (DESYREL) 50 MG tablet, Take 1 tablet (50 mg total) by mouth at bedtime., Disp: 30 tablet, Rfl: 0  Allergies  Allergen Reactions  . Percocet [Oxycodone-Acetaminophen] Diarrhea, Nausea And Vomiting and Nausea Only  . Tramadol Hcl Diarrhea, Nausea And Vomiting and Nausea Only  . Vicodin [Hydrocodone-Acetaminophen] Diarrhea, Nausea And Vomiting and Nausea Only    ROS  No other specific complaints in a complete review of systems (except as listed in HPI above).  Objective  Vitals:   01/28/18 1020  BP: 138/86  Pulse: 96  Resp: 16   Temp: 98.2 F (36.8 C)  TempSrc: Oral  SpO2: 96%  Weight: 235 lb 14.4 oz (107 kg)  Height: 5\' 6"  (1.676 m)    Body mass index is 38.08 kg/m.  Nursing Note and Vital Signs reviewed.  Physical Exam  Constitutional: Patient appears well-developed and well-nourished. Obese No distress.  Cardiovascular: Normal rate, regular rhythm, S1/S2 present.  No murmur or rub heard.  Pulmonary/Chest: Effort normal and breath sounds clear. No respiratory distress or retractions. Psychiatric: Patient has a normal mood and affect. behavior is normal. Judgment and thought content normal.  No results found for this or any previous visit (from the past 72 hour(s)).  Assessment & Plan  1. Hospital discharge follow-up Medications reconciled.  2. Controlled type 2 diabetes mellitus without complication, without long-term current use of insulin (HCC) - metFORMIN (GLUCOPHAGE) 500 MG tablet; Take 1 tablet (500 mg total) by mouth 2 (two) times daily with a meal.  Dispense: 180 tablet; Refill: 0  3. Gastroesophageal reflux disease, esophagitis presence not specified - omeprazole (PRILOSEC) 20 MG capsule; Take 1 capsule (20 mg total) by mouth daily.  Dispense: 90 capsule; Refill: 0  4. Pure hypercholesterolemia Much improved lipids continue medicine and healthy diet change s - rosuvastatin (CRESTOR) 40 MG tablet; Take 1 tablet (40 mg total) by mouth daily.  Dispense: 90 tablet; Refill: 0  5. Essential hypertension -taken of atenolol at hospital, BP mildly elevated today will come in for nurse BP check in 2 weeks and full appointment following  - lisinopril (PRINIVIL,ZESTRIL) 20 MG tablet; Take 1 tablet (20 mg total) by mouth daily.  Dispense: 90 tablet; Refill: 0 - hydrochlorothiazide (HYDRODIURIL) 25 MG tablet; Take 1 tablet (25 mg total) by mouth daily.  Dispense: 90 tablet; Refill: 0  6. Morbid obesity ([place]) Continue diet and exercise   7. Paranoid schizophrenia ([place]) Stable continue current  therapies and act follow up   8. Anxiety disorder, unspecified type Stable continue current therapies and act follow up   9. Meningioma [facility]) Refusing follow-up at this time states will go when she is ready. Discussed risks and benefits pt aware and of sound mind presently  10. BMI 38.0-38.9,adult Continue weight loss with diet and exercise    -Red flags and when to present for emergency care or RTC including fever >101.50F, chest pain, shortness of breath, new/worsening/un-resolving symptoms, reviewed with patient at time of visit. Follow up and care instructions discussed and provided in AVS.  ------------------------------------------ I have reviewed this encounter including the documentation in this note and/or discussed this patient with the provider, Suezanne Cheshire DNP AGNP-C. I am certifying that I agree with the content of this note as supervising physician. Enid Derry, [facility] [place] 01/29/2018, 6:01 PM

## 2018-02-17 DIAGNOSIS — G4733 Obstructive sleep apnea (adult) (pediatric): Secondary | ICD-10-CM | POA: Diagnosis not present

## 2018-03-02 LAB — HM DIABETES EYE EXAM

## 2018-04-01 ENCOUNTER — Ambulatory Visit: Payer: Self-pay | Admitting: Family Medicine

## 2018-04-01 ENCOUNTER — Encounter: Payer: Self-pay | Admitting: Family Medicine

## 2018-04-01 ENCOUNTER — Ambulatory Visit (INDEPENDENT_AMBULATORY_CARE_PROVIDER_SITE_OTHER): Payer: Medicare Other | Admitting: Family Medicine

## 2018-04-01 ENCOUNTER — Telehealth: Payer: Self-pay

## 2018-04-01 VITALS — BP 138/84 | HR 86 | Temp 97.7°F | Resp 16 | Ht 66.0 in | Wt 240.1 lb

## 2018-04-01 DIAGNOSIS — F200 Paranoid schizophrenia: Secondary | ICD-10-CM

## 2018-04-01 DIAGNOSIS — D329 Benign neoplasm of meninges, unspecified: Secondary | ICD-10-CM

## 2018-04-01 DIAGNOSIS — K219 Gastro-esophageal reflux disease without esophagitis: Secondary | ICD-10-CM

## 2018-04-01 DIAGNOSIS — Z23 Encounter for immunization: Secondary | ICD-10-CM | POA: Diagnosis not present

## 2018-04-01 DIAGNOSIS — E2839 Other primary ovarian failure: Secondary | ICD-10-CM

## 2018-04-01 DIAGNOSIS — E119 Type 2 diabetes mellitus without complications: Secondary | ICD-10-CM | POA: Diagnosis not present

## 2018-04-01 DIAGNOSIS — I700 Atherosclerosis of aorta: Secondary | ICD-10-CM | POA: Diagnosis not present

## 2018-04-01 DIAGNOSIS — E7800 Pure hypercholesterolemia, unspecified: Secondary | ICD-10-CM

## 2018-04-01 DIAGNOSIS — I10 Essential (primary) hypertension: Secondary | ICD-10-CM

## 2018-04-01 LAB — POCT GLYCOSYLATED HEMOGLOBIN (HGB A1C): HbA1c, POC (prediabetic range): 6.2 % (ref 5.7–6.4)

## 2018-04-01 MED ORDER — OMEPRAZOLE 20 MG PO CPDR: 20.0000 mg | DELAYED_RELEASE_CAPSULE | Freq: Every day | ORAL | 5 refills | Status: DC

## 2018-04-01 MED ORDER — ATORVASTATIN CALCIUM 40 MG PO TABS: 40.0000 mg | ORAL_TABLET | Freq: Every day | ORAL | 5 refills | Status: DC

## 2018-04-01 MED ORDER — METFORMIN HCL 500 MG PO TABS
500.0000 mg | ORAL_TABLET | Freq: Two times a day (BID) | ORAL | 5 refills | Status: DC
Start: 2018-04-01 — End: 2018-11-04

## 2018-04-01 MED ORDER — ROSUVASTATIN CALCIUM 40 MG PO TABS: 40.0000 mg | ORAL_TABLET | Freq: Every day | ORAL | 5 refills | Status: DC

## 2018-04-01 MED ORDER — HYDROCHLOROTHIAZIDE 25 MG PO TABS
25.0000 mg | ORAL_TABLET | Freq: Every day | ORAL | 5 refills | Status: DC
Start: 2018-04-01 — End: 2018-11-04

## 2018-04-01 MED ORDER — ASPIRIN EC 81 MG PO TBEC: 81.0000 mg | DELAYED_RELEASE_TABLET | Freq: Every day | ORAL | 5 refills | Status: DC

## 2018-04-01 MED ORDER — LISINOPRIL 20 MG PO TABS: 20.0000 mg | ORAL_TABLET | Freq: Every day | ORAL | 5 refills | Status: DC

## 2018-04-01 NOTE — Progress Notes (Signed)
Name: Lauren [name]   MRN: [id number]    DOB: [birth year]   Date:04/01/2018       Progress Note  Subjective  Chief Complaint  Chief Complaint  Patient presents with  . Medication Refill    Now gets bubble package of all her medications at [facility]  . Diabetes    Does not check sugar at home-was homeless for awhile and just got an apartment  . Hypertension    States she has a tumor in the back of her head that causes headaches  . Hyperlipidemia  . Schizophrenia    Sees Dr. Loni Muse at [facility] in [place]    HPI  Schizophrenia: she was very paranoid and closed her checking account and was late on her rental , she was evicted and was homeless for a couple of months. She is doing well now. States she has been able to get monthly injections of Haldol at [facility]. No longer hearing voices and feeling condemned. She is now at an apartment in [place]. Taking medications that are being delivered by pill packs weekly. She has nurse coordinator Herma Mering [phone] .  Meningioma: history of surgery and CT done 10/2017 showed recurrence, she missed follow up with Dr. Lacinda Axon because she was homeless. We will place another referral since she is noticed worsening of tremors on both hands.   Aorta atherosclerosis: found on  CT chest done Nov 2018. She is on statin and aspirin daily. Unchanged   DMII: had labs done while at [facility], taking metformin and hgbA1C is at goal. She states recently had an eye exam and we will obtain records. She has polyphagia, polydipsia and polyuria. [name]   HTN: she was off hctz because of low magnesium and potassium but seems like she is back on medication. We will contact her pharmacy. She denies chest pain or palpitation   Patient Active Problem List   Diagnosis Date Noted  . Schizoaffective disorder ([place]) 12/29/2017  . Morbid obesity ([place]) 12/09/2017  . Meningioma ([place]) 11/25/2017  . Atherosclerosis of aorta ([place]) 11/25/2017  .  Calcification of coronary artery 11/25/2017  . Schizophrenia ([place]) 11/04/2017  . Acid reflux 06/29/2015  . Diabetes mellitus type 2, controlled, without complications ([place]) 94/58/5929  . Cardiac murmur 06/29/2015  . Arthritis of knee, degenerative 06/28/2015  . Insomnia w/ sleep apnea 03/21/2015  . Anxiety disorder 03/21/2015  . Hypertension 03/21/2015  . HLD (hyperlipidemia) 03/21/2015  . Urge incontinence 03/21/2015    Past Surgical History:  Procedure Laterality Date  . BRAIN TUMOR EXCISION  2012   benign  . CARDIAC CATHETERIZATION  2008  . CESAREAN SECTION    . CYST REMOVAL HAND    . LUNG LOBECTOMY  1977   benign tumor  . TOTAL KNEE ARTHROPLASTY Left 01/28/2017   Procedure: TOTAL KNEE ARTHROPLASTY;  Surgeon: Corky Mull, MD;  Location: [facility] ORS;  Service: Orthopedics;  Laterality: Left;    Family History  Problem Relation Age of Onset  . Heart disease Brother   . Depression Mother   . Heart attack Mother   . Stroke Mother   . Alcohol abuse Father   . Stroke Father   . Diabetes Sister   . Diabetes Sister   . Stomach cancer Sister   . Kidney disease Sister   . COPD Brother   . Lung cancer Brother   . Diabetes Brother     Social History   Socioeconomic History  . Marital status: Single  Spouse name: Not on file  . Number of children: Not on file  . Years of education: Not on file  . Highest education level: Not on file  Occupational History  . Not on file  Social Needs  . Financial resource strain: Not hard at all  . Food insecurity:    Worry: Never true    Inability: Never true  . Transportation needs:    Medical: No    Non-medical: No  Tobacco Use  . Smoking status: Never Smoker  . Smokeless tobacco: Never Used  Substance and Sexual Activity  . Alcohol use: No    Alcohol/week: 0.0 oz  . Drug use: No  . Sexual activity: Not Currently  Lifestyle  . Physical activity:    Days per week: 0 days    Minutes per session: 0 min  . Stress: Only a  little  Relationships  . Social connections:    Talks on phone: Not on file    Gets together: Not on file    Attends religious service: More than 4 times per year    Active member of club or organization: No    Attends meetings of clubs or organizations: Never    Relationship status: Married  . Intimate partner violence:    Fear of current or ex partner: No    Emotionally abused: No    Physically abused: No    Forced sexual activity: No  Other Topics Concern  . Not on file  Social History Narrative  . Not on file     Current Outpatient Medications:  .  aspirin EC 81 MG tablet, Take 81 mg by mouth daily before breakfast. , Disp: , Rfl:  .  benztropine (COGENTIN) 1 MG tablet, Take 1 tablet (1 mg total) by mouth at bedtime., Disp: 30 tablet, Rfl: 0 .  haloperidol decanoate (HALDOL DECANOATE) 100 MG/ML injection, Inject 1 mL (100 mg total) into the muscle every 28 (twenty-eight) days. Next injection due on 01/27/18, Disp: 1 mL, Rfl: 0 .  hydrochlorothiazide (HYDRODIURIL) 25 MG tablet, Take 1 tablet (25 mg total) by mouth daily., Disp: 30 tablet, Rfl: 5 .  ibuprofen (ADVIL,MOTRIN) 200 MG tablet, Take 600 mg by mouth every 8 (eight) hours as needed (for knee pain.)., Disp: , Rfl:  .  lisinopril (PRINIVIL,ZESTRIL) 20 MG tablet, Take 1 tablet (20 mg total) by mouth daily., Disp: 30 tablet, Rfl: 5 .  metFORMIN (GLUCOPHAGE) 500 MG tablet, Take 1 tablet (500 mg total) by mouth 2 (two) times daily with a meal., Disp: 30 tablet, Rfl: 5 .  omeprazole (PRILOSEC) 20 MG capsule, Take 1 capsule (20 mg total) by mouth daily., Disp: 30 capsule, Rfl: 5 .  traZODone (DESYREL) 50 MG tablet, Take 1 tablet (50 mg total) by mouth at bedtime., Disp: 30 tablet, Rfl: 0 .  rosuvastatin (CRESTOR) 40 MG tablet, Take 1 tablet (40 mg total) by mouth daily., Disp: 30 tablet, Rfl: 5  Allergies  Allergen Reactions  . Percocet [Oxycodone-Acetaminophen] Diarrhea, Nausea And Vomiting and Nausea Only  . Tramadol Hcl  Diarrhea, Nausea And Vomiting and Nausea Only  . Vicodin [Hydrocodone-Acetaminophen] Diarrhea, Nausea And Vomiting and Nausea Only     ROS  Constitutional: Negative for fever, positive for weight change.  Respiratory: Negative for cough and shortness of breath.   Cardiovascular: Negative for chest pain or palpitations.  Gastrointestinal: Negative for abdominal pain, no bowel changes.  Musculoskeletal: Negative for gait problem or joint swelling.  Skin: Negative for rash.  Neurological: Negative  for dizziness or headache.   No other specific complaints in a complete review of systems (except as listed in HPI above).  Objective  Vitals:   04/01/18 0933  BP: 138/84  Pulse: 86  Resp: 16  Temp: 97.7 F (36.5 C)  TempSrc: Oral  SpO2: 96%  Weight: 240 lb 1.6 oz (108.9 kg)  Height: 5' 6"  (1.676 m)    Body mass index is 38.75 kg/m.  Physical Exam  Constitutional: Patient appears well-developed and well-nourished. Obese No distress.  HEENT: head atraumatic, normocephalic, pupils equal and reactive to light, neck supple, throat within normal limits Cardiovascular: Normal rate, regular rhythm and normal heart sounds.  No murmur heard. No BLE edema. Pulmonary/Chest: Effort normal and breath sounds normal. No respiratory distress. Abdominal: Soft.  There is no tenderness. Psychiatric: Patient has a normal mood and affect. behavior is normal. Judgment and thought content normal.  Recent Results (from the past 2160 hour(s))  Glucose, capillary     Status: Abnormal   Collection Time: 01/01/18 11:23 AM  Result Value Ref Range   Glucose-Capillary 121 (H) 65 - 99 mg/dL   Comment 1 Notify RN   Glucose, capillary     Status: Abnormal   Collection Time: 01/01/18  4:29 PM  Result Value Ref Range   Glucose-Capillary 112 (H) 65 - 99 mg/dL   Comment 1 Notify RN   Glucose, capillary     Status: Abnormal   Collection Time: 01/01/18  9:06 PM  Result Value Ref Range   Glucose-Capillary 101  (H) 65 - 99 mg/dL  Glucose, capillary     Status: None   Collection Time: 01/02/18  6:27 AM  Result Value Ref Range   Glucose-Capillary 99 65 - 99 mg/dL  Basic metabolic panel     Status: Abnormal   Collection Time: 01/02/18  6:42 AM  Result Value Ref Range   Sodium 138 135 - 145 mmol/L   Potassium 3.9 3.5 - 5.1 mmol/L   Chloride 107 101 - 111 mmol/L   CO2 27 22 - 32 mmol/L   Glucose, Bld 118 (H) 65 - 99 mg/dL   BUN 13 6 - 20 mg/dL   Creatinine, Ser 0.81 0.44 - 1.00 mg/dL   Calcium 9.2 8.9 - 10.3 mg/dL   GFR calc non Af Amer >60 >60 mL/min   GFR calc Af Amer >60 >60 mL/min    Comment: (NOTE) The eGFR has been calculated using the CKD EPI equation. This calculation has not been validated in all clinical situations. eGFR's persistently <60 mL/min signify possible Chronic Kidney Disease.    Anion gap 4 (L) 5 - 15    Comment: Performed at [facility], [address] [place], [place] [address]  Glucose, capillary     Status: Abnormal   Collection Time: 01/03/18  8:05 AM  Result Value Ref Range   Glucose-Capillary 104 (H) 65 - 99 mg/dL  Glucose, capillary     Status: None   Collection Time: 01/05/18  6:13 AM  Result Value Ref Range   Glucose-Capillary 97 65 - 99 mg/dL  Glucose, capillary     Status: Abnormal   Collection Time: 01/06/18  8:53 AM  Result Value Ref Range   Glucose-Capillary 159 (H) 65 - 99 mg/dL  Glucose, capillary     Status: None   Collection Time: 01/06/18  4:51 PM  Result Value Ref Range   Glucose-Capillary 97 65 - 99 mg/dL  Glucose, capillary     Status: Abnormal  Collection Time: 01/07/18  9:04 AM  Result Value Ref Range   Glucose-Capillary 147 (H) 65 - 99 mg/dL  Glucose, capillary     Status: Abnormal   Collection Time: 01/08/18  7:20 AM  Result Value Ref Range   Glucose-Capillary 103 (H) 65 - 99 mg/dL  Glucose, capillary     Status: Abnormal   Collection Time: 01/09/18  7:38 AM  Result Value Ref Range   Glucose-Capillary 106 (H) 65 -  99 mg/dL  Glucose, capillary     Status: Abnormal   Collection Time: 01/10/18  7:04 AM  Result Value Ref Range   Glucose-Capillary 121 (H) 65 - 99 mg/dL   Comment 1 Document in Chart   Glucose, capillary     Status: Abnormal   Collection Time: 01/10/18 11:29 AM  Result Value Ref Range   Glucose-Capillary 105 (H) 65 - 99 mg/dL  Glucose, capillary     Status: Abnormal   Collection Time: 01/10/18  4:40 PM  Result Value Ref Range   Glucose-Capillary 114 (H) 65 - 99 mg/dL  Glucose, capillary     Status: Abnormal   Collection Time: 01/11/18  7:10 AM  Result Value Ref Range   Glucose-Capillary 115 (H) 65 - 99 mg/dL   Comment 1 Notify RN   Glucose, capillary     Status: Abnormal   Collection Time: 01/12/18  7:36 AM  Result Value Ref Range   Glucose-Capillary 111 (H) 65 - 99 mg/dL   Comment 1 Notify RN   Glucose, capillary     Status: Abnormal   Collection Time: 01/12/18 11:17 AM  Result Value Ref Range   Glucose-Capillary 113 (H) 65 - 99 mg/dL   Comment 1 Notify RN   Glucose, capillary     Status: Abnormal   Collection Time: 01/13/18  7:25 AM  Result Value Ref Range   Glucose-Capillary 100 (H) 65 - 99 mg/dL   Comment 1 Notify RN   POCT HgB A1C     Status: Normal   Collection Time: 04/01/18  9:41 AM  Result Value Ref Range   Hemoglobin A1C  4.0 - 5.6 %   HbA1c POC (<> result, manual entry)  4.0 - 5.6 %   HbA1c, POC (prediabetic range) 6.2 5.7 - 6.4 %   HbA1c, POC (controlled diabetic range)  0.0 - 7.0 %      PHQ2/9: Depression screen [facility] 2/9 04/01/2018 01/28/2018 12/09/2017 11/25/2017 07/29/2017  Decreased Interest 2 0 0 1 0  Down, Depressed, Hopeless 0 0 0 1 1  PHQ - 2 Score 2 0 0 2 1  Altered sleeping 0 2 - 2 1  Tired, decreased energy 0 1 - 2 1  Change in appetite 3 0 - 2 1  Feeling bad or failure about yourself  2 0 - 1 1  Trouble concentrating 2 0 - 1 0  Moving slowly or fidgety/restless 0 0 - 1 0  Suicidal thoughts 0 0 - 0 -  PHQ-9 Score 9 3 - 11 5  Difficult doing  work/chores Somewhat difficult Not difficult at all - Somewhat difficult Somewhat difficult   Seeing psychiatrist   Fall Risk: Fall Risk  04/01/2018 12/09/2017 11/25/2017 07/29/2017 07/21/2017  Falls in the past year? Yes No No No No  Number falls in past yr: 1 - - - -  Injury with Fall? No - - - -     Functional Status Survey: Is the patient deaf or have difficulty hearing?: No Does the patient have  difficulty seeing, even when wearing glasses/contacts?: Yes Does the patient have difficulty concentrating, remembering, or making decisions?: Yes Does the patient have difficulty walking or climbing stairs?: Yes(Knee Pain) Does the patient have difficulty dressing or bathing?: No Does the patient have difficulty doing errands alone such as visiting a doctor's office or shopping?: No    Assessment & Plan  1. Controlled type 2 diabetes mellitus without complication, without long-term current use of insulin (HCC)  - POCT HgB A1C - metFORMIN (GLUCOPHAGE) 500 MG tablet; Take 1 tablet (500 mg total) by mouth 2 (two) times daily with a meal.  Dispense: 30 tablet; Refill: 5  2. Need for vaccination for pneumococcus  - Pneumococcal conjugate vaccine 13-valent IM  3. Meningioma [facility])  History of removal, but recent CT brain 10/2017 showed recurrence she will follow up with Dr. Lacinda Axon  4. Paranoid schizophrenia ([place])  Doing better, seeing psychiatrist. She has pill packs delivered to her again and getting haldol injections at home or at [facility] once a month   5. Atherosclerosis of aorta ([place])  On statin therapy   6. Ovarian failure  - DG Bone Density; Future  7. Essential hypertension  - hydrochlorothiazide (HYDRODIURIL) 25 MG tablet; Take 1 tablet (25 mg total) by mouth daily.  Dispense: 30 tablet; Refill: 5 - lisinopril (PRINIVIL,ZESTRIL) 20 MG tablet; Take 1 tablet (20 mg total) by mouth daily.  Dispense: 30 tablet; Refill: 5  8. Gastroesophageal reflux disease, esophagitis  presence not specified  - omeprazole (PRILOSEC) 20 MG capsule; Take 1 capsule (20 mg total) by mouth daily.  Dispense: 30 capsule; Refill: 5  9. Pure hypercholesterolemia  - rosuvastatin (CRESTOR) 40 MG tablet; Take 1 tablet (40 mg total) by mouth daily.  Dispense: 30 tablet; Refill: 5  10. Morbid obesity ([place])  Discussed with the patient the risk posed by an increased BMI. Discussed importance of portion control, calorie counting and at least 150 minutes of physical activity weekly. Avoid sweet beverages and drink more water. Eat at least 6 servings of fruit and vegetables daily

## 2018-04-01 NOTE — Telephone Encounter (Signed)
Spoke with Herma Mering, RN they are a community out reach programs and will follow up with patient's on compliance regarding lab work, dr. Thomasene Lot, and DM consulting (Food, Medication). Mrs. Lauren [name] was informed we received her letter and she has global consent to help patient with her needs and reaching out to her physicians. Mrs. Lauren [name] was informed Dr. Ancil Boozer made another Neurology referral and she will follow up on the patient by Monday to see if she an appointment.  Lauren Cruise, RN also wanted to inform Dr. Ancil Boozer her Cogentin was increased to 2 mg daily and she only get the Haldol injections monthly instead of the oral medication. Mrs. Lauren [name] does drive out to the patient house if she is unable to come in for her injection monthly to keep up compliance.

## 2018-04-03 ENCOUNTER — Encounter: Payer: Self-pay | Admitting: Family Medicine

## 2018-04-03 MED ORDER — BENZTROPINE MESYLATE 2 MG PO TABS: 2.0000 mg | ORAL_TABLET | Freq: Every day | ORAL | Status: DC

## 2018-04-21 DIAGNOSIS — D329 Benign neoplasm of meninges, unspecified: Secondary | ICD-10-CM | POA: Diagnosis not present

## 2018-04-30 ENCOUNTER — Encounter: Payer: Self-pay | Admitting: Radiation Oncology

## 2018-04-30 ENCOUNTER — Other Ambulatory Visit: Payer: Self-pay

## 2018-04-30 ENCOUNTER — Ambulatory Visit
Admission: RE | Admit: 2018-04-30 | Discharge: 2018-04-30 | Disposition: A | Discharge status: Home / Self Care | Payer: Medicare Other | Source: Ambulatory Visit | Attending: Radiation Oncology | Admitting: Radiation Oncology

## 2018-04-30 VITALS — BP 154/99 | HR 85 | Temp 97.5°F | Resp 20 | Wt 241.7 lb

## 2018-04-30 DIAGNOSIS — D329 Benign neoplasm of meninges, unspecified: Secondary | ICD-10-CM

## 2018-04-30 NOTE — Consult Note (Signed)
NEW PATIENT EVALUATION  Name: Lauren [name]  MRN: [id number]  Date:   04/30/2018     DOB: [birth year]   This 67 y.o. female patient presents to the clinic for initial evaluation of benign meningioma status post resection in 2012 with progression  REFERRING PHYSICIAN: Steele Sizer, MD  CHIEF COMPLAINT:  Chief Complaint  Patient presents with  . Cancer    Pt is here for initial consultation of a meningioma     DIAGNOSIS: There were no encounter diagnoses.   PREVIOUS INVESTIGATIONS:  CT scans reviewed Clinical notes reviewed Pathology requested for my review  HPI: patient is an 67 year old female with history of paranoid schizophrenia who is status post resection for a benign meningioma in 2012. Tumor was a grade 1 benign meningioma.She's been having some increasing headaches some blurring of her vision as well as tremor which may be related to her medication. She recently had a CT scan with contrast of her head back in February 2019 showing recurrent 2.1 cm posterior left frontal meningioma.she specifically denies any worsening of speech or any seizure activity. She was consult at [facility] by neurosurgery and although surgery was offered patient has declined and recommendation for radiation therapy was made. She is seen today and is doing well. She does have a tremor although this may be related to her Haldol which she takes for her psychiatric illness. Patient is also been referred to neurology for help in management of possible movement disorders in the future.  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    PAST SURGICAL HISTORY:  Past Surgical History:  Procedure Laterality Date  . BRAIN TUMOR EXCISION  2012   benign  . CARDIAC CATHETERIZATION  2008  . CESAREAN SECTION    . CYST REMOVAL HAND    . LUNG LOBECTOMY  1977   benign tumor  . TOTAL KNEE ARTHROPLASTY Left 01/28/2017   Procedure: TOTAL KNEE ARTHROPLASTY;  Surgeon: Corky Mull, MD;  Location: [facility] ORS;  Service: Orthopedics;  Laterality: Left;    FAMILY HISTORY: family history includes Alcohol abuse in her father; COPD in her brother; Depression in her mother; Diabetes in her brother, sister, and sister; Heart attack in her mother; Heart disease in her brother; Kidney disease in her sister; Lung cancer in her brother; Stomach cancer in her sister; Stroke in her father and mother.  SOCIAL HISTORY:  reports that she has never smoked. She has never used smokeless tobacco. She reports that she does not drink alcohol or use drugs.  ALLERGIES: Percocet [oxycodone-acetaminophen]; Tramadol hcl; and Vicodin [hydrocodone-acetaminophen]  MEDICATIONS:  Current Outpatient Medications  Medication Sig Dispense Refill  . aspirin EC 81 MG tablet Take 1 tablet (81 mg total) by mouth daily before breakfast. 30 tablet 5  . atorvastatin (LIPITOR) 40 MG tablet Take 1 tablet (40 mg total) by mouth daily at 6 PM. 30 tablet 5  . benztropine (COGENTIN) 2 MG tablet Take 1 tablet (2 mg total) by mouth at  bedtime. [facility] Dr. Doyne Keel    . haloperidol decanoate (HALDOL DECANOATE) 100 MG/ML injection Inject 1 mL (100 mg total) into the muscle every 28 (twenty-eight) days. Next  injection due on 01/27/18 1 mL 0  . hydrochlorothiazide (HYDRODIURIL) 25 MG tablet Take 1 tablet (25 mg total) by mouth daily. 30 tablet 5  . ibuprofen (ADVIL,MOTRIN) 200 MG tablet Take 600 mg by mouth every 8 (eight) hours as needed (for knee pain.).    [name] lisinopril (PRINIVIL,ZESTRIL) 20 MG tablet Take 1 tablet (20 mg total) by mouth daily. 30 tablet 5  . metFORMIN (GLUCOPHAGE) 500 MG tablet Take 1 tablet (500 mg total) by mouth 2 (two) times daily with a meal. 30 tablet 5  . omeprazole (PRILOSEC) 20 MG capsule Take 1 capsule (20 mg total) by mouth daily. 30 capsule 5  . traZODone (DESYREL) 50 MG tablet Take 1 tablet (50 mg total) by mouth at bedtime. 30 tablet 0   No current facility-administered medications for this encounter.     ECOG PERFORMANCE STATUS:  1 - Symptomatic but completely ambulatory  REVIEW OF SYSTEMS:  Patient denies any weight loss, fatigue, weakness, fever, chills or night sweats. Patient denies any loss of vision, blurred vision. Patient denies any ringing  of the ears or hearing loss. No irregular heartbeat. Patient denies heart murmur or history of fainting. Patient denies any chest pain or pain radiating to her upper extremities. Patient denies any shortness of breath, difficulty breathing at night, cough or hemoptysis. Patient denies any swelling in the lower legs. Patient denies any nausea vomiting, vomiting of blood, or coffee ground material in the vomitus. Patient denies any stomach pain. Patient states has had normal bowel movements no significant constipation or diarrhea. Patient denies any dysuria, hematuria or significant nocturia. Patient denies any problems walking, swelling in the joints or loss of balance. Patient denies any skin changes, loss of hair or loss of weight. Patient denies any excessive worrying or anxiety or significant depression. Patient denies any problems with insomnia. Patient denies excessive thirst, polyuria, polydipsia. Patient denies any  swollen glands, patient denies easy bruising or easy bleeding. Patient denies any recent infections, allergies or URI. Patient "s visual fields have not changed significantly in recent time.    PHYSICAL EXAM: BP (!) 154/99 (BP Location: Left Arm)   Pulse 85   Temp (!) 97.5 F (36.4 C) (Tympanic)   Resp 20   Wt 241 lb 11.8 oz (109.6 kg)   BMI 39.02 kg/m  Crude visual fields are within normal range. Motor sensory and DTR levels are equal and symmetric in upper lower extremities proprioception is intact.Well-developed well-nourished patient in NAD. HEENT reveals PERLA, EOMI, discs not visualized.  Oral cavity is clear. No oral mucosal lesions are identified. Neck is clear without evidence of cervical or supraclavicular adenopathy. Lungs are clear to A&P. Cardiac examination is essentially unremarkable with regular rate and rhythm without murmur rub or thrill. Abdomen is benign with no organomegaly or masses noted. Motor sensory and DTR levels are equal and symmetric in the upper and lower extremities. Cranial nerves II through XII are grossly intact. Proprioception is intact. No peripheral adenopathy or edema is identified. No motor or sensory levels are noted. Crude visual fields are within normal range.  LABORATORY DATA: athology reports have been requested for my review from [facility] RESULTS:CT scan is reviewed and compatible with the above-stated findings   IMPRESSION: recurrent benign meningiomain patient status post resection in 2012 for fractionated  stereotactic radiosurgery  PLAN: at this time I to go ahead with fraction stereotactic radiosurgery. I would plan on delivering 30 grays in 5 fractions using IM RT treatment planning and delivery based on the hypofractionated course of treatment of the brain. Risks and benefits of treatment including possible hair loss fatigue increased chance of headaches and slight chance of brain necrosis all were discussed in detail with the patient.  She seems to comprehend my treatment plan well. I have personally set up and scheduled CT simulation for next week. We will perform a CT fusion study for treatment planning purposes. Patient seems to comprehend my treatment plan well.  I would like to take this opportunity to thank you for allowing me to participate in the care of your patient.Noreene Filbert, MD

## 2018-05-06 ENCOUNTER — Ambulatory Visit: Payer: Self-pay | Admitting: *Deleted

## 2018-05-06 ENCOUNTER — Ambulatory Visit
Admission: RE | Admit: 2018-05-06 | Discharge: 2018-05-06 | Disposition: A | Discharge status: Home / Self Care | Payer: Medicare Other | Source: Ambulatory Visit | Attending: Radiation Oncology | Admitting: Radiation Oncology

## 2018-05-06 DIAGNOSIS — D329 Benign neoplasm of meninges, unspecified: Secondary | ICD-10-CM | POA: Insufficient documentation

## 2018-05-06 DIAGNOSIS — Z510 Encounter for antineoplastic radiation therapy: Secondary | ICD-10-CM | POA: Insufficient documentation

## 2018-05-06 NOTE — Telephone Encounter (Signed)
Patient's blood pressure today was 164/98, 162/92. These were taken at her [facility] visit. Stated she is only taking lisinopril for her pressure at this time. Reports she feels her vision is hazy at times but not blurry. Denies CP/SOB/one-sided weakness/headaches/dizziness.Stated her mouth feels dry a lot. She has scheduled radiation coming up 9/9-13th for a brain tumor.No PCP availability. Scheduled for tomorrow with Edgardo Roys, NP. Reviewed advice care and sx that would require emergency treatment. Stated she understood.  Reason for Disposition . Systolic BP  >= 826 OR Diastolic >= 415  Answer Assessment - Initial Assessment Questions 1. BLOOD PRESSURE: "What is the blood pressure?" "Did you take at least two measurements 5 minutes apart?"    164/98 this morning 2. ONSET: "When did you take your blood pressure?"     today 3. HOW: "How did you obtain the blood pressure?" (e.g., visiting nurse, automatic home BP monitor)     At her [facility] visit today. 4. HISTORY: "Do you have a history of high blood pressure?"     yes 5. MEDICATIONS: "Are you taking any medications for blood pressure?" "Have you missed any doses recently?"     yes 6. OTHER SYMPTOMS: "Do you have any symptoms?" (e.g., headache, chest pain, blurred vision, difficulty breathing, weakness)     Hazy vision at times.  7. PREGNANCY: "Is there any chance you are pregnant?" "When was your last menstrual period?"     no  Protocols used: HIGH BLOOD PRESSURE-A-AH

## 2018-05-07 ENCOUNTER — Ambulatory Visit (INDEPENDENT_AMBULATORY_CARE_PROVIDER_SITE_OTHER): Payer: Medicare Other | Admitting: Nurse Practitioner

## 2018-05-07 ENCOUNTER — Encounter: Payer: Self-pay | Admitting: Nurse Practitioner

## 2018-05-07 VITALS — BP 140/90 | HR 88 | Temp 98.1°F | Resp 16 | Ht 66.0 in | Wt 243.0 lb

## 2018-05-07 DIAGNOSIS — I10 Essential (primary) hypertension: Secondary | ICD-10-CM | POA: Diagnosis not present

## 2018-05-07 DIAGNOSIS — Z1239 Encounter for other screening for malignant neoplasm of breast: Secondary | ICD-10-CM

## 2018-05-07 DIAGNOSIS — Z23 Encounter for immunization: Secondary | ICD-10-CM

## 2018-05-07 DIAGNOSIS — Z1231 Encounter for screening mammogram for malignant neoplasm of breast: Secondary | ICD-10-CM

## 2018-05-07 NOTE — Patient Instructions (Addendum)
-   Please cut down on salty foods: like bologna,  - Drink Water try for 6 glasses of water or 3 bottles of water.    -Please do call to schedule your mammogram and bone density study; the number to schedule one at either [facility] or [facility] is [phone].

## 2018-05-07 NOTE — Progress Notes (Signed)
Name: Lauren [name]   MRN: [id number]    DOB: 1951-05-22   Date:05/07/2018       Progress Note  Subjective  Chief Complaint  Chief Complaint  Patient presents with  . Hypertension    HPI  Patient presents today due to noted hypertension at home visit yesterday- 164/98. She states she takes all the medicines in her pill pack daily with no missed doses per patient- brings pill pack today and appears to be correct. Taking lisinopril 20mg  a day and HCTZ 25mg  a day.    Patient Active Problem List   Diagnosis Date Noted  . Schizoaffective disorder ([place]) 12/29/2017  . Morbid obesity ([place]) 12/09/2017  . Meningioma ([place]) 11/25/2017  . Atherosclerosis of aorta ([place]) 11/25/2017  . Calcification of coronary artery 11/25/2017  . Schizophrenia ([place]) 11/04/2017  . Acid reflux 06/29/2015  . Diabetes mellitus type 2, controlled, without complications ([place]) 02/63/7858  . Cardiac murmur 06/29/2015  . Arthritis of knee, degenerative 06/28/2015  . Insomnia w/ sleep apnea 03/21/2015  . Anxiety disorder 03/21/2015  . Hypertension 03/21/2015  . HLD (hyperlipidemia) 03/21/2015  . Urge incontinence 03/21/2015    Past Medical History:  Diagnosis Date  . Anxiety   . Apnea, sleep 02/02/2014  . Awareness of heartbeats 02/02/2014  . Breathlessness on exertion 02/02/2014  . Diabetes mellitus   . Encounter for pre-employment examination 06/29/2015  . Excessive sweating 07/05/2015  . Fibromyalgia   . GERD (gastroesophageal reflux disease)   . Gravida 1 10/26/2015   1.    . Heart murmur   . Herniated disc   . Hyperlipidemia   . Hypertension   . Itch of skin 10/26/2015  . Lack of bladder control   . Lung tumor   . Rheumatoid arthritis ([place])   . Schizoaffective disorder ([place])   . Screening for cervical cancer 07/29/2017  . Seizure [facility])    after brain surgery 2012. last seizure 2013!  [name] Sex counseling 10/26/2015  . Sleep apnea   . Status post total knee replacement using cement, left  01/28/2017  . Stiffness of both knees 06/22/2015    Past Surgical History:  Procedure Laterality Date  . BRAIN TUMOR EXCISION  2012   benign  . CARDIAC CATHETERIZATION  2008  . CESAREAN SECTION    . CYST REMOVAL HAND    . LUNG LOBECTOMY  1977   benign tumor  . TOTAL KNEE ARTHROPLASTY Left 01/28/2017   Procedure: TOTAL KNEE ARTHROPLASTY;  Surgeon: Corky Mull, MD;  Location: [facility] ORS;  Service: Orthopedics;  Laterality: Left;    Social History   Tobacco Use  . Smoking status: Never Smoker  . Smokeless tobacco: Never Used  Substance Use Topics  . Alcohol use: No    Alcohol/week: 0.0 standard drinks     Current Outpatient Medications:  .  aspirin EC 81 MG tablet, Take 1 tablet (81 mg total) by mouth daily before breakfast., Disp: 30 tablet, Rfl: 5 .  atorvastatin (LIPITOR) 40 MG tablet, Take 1 tablet (40 mg total) by mouth daily at 6 PM., Disp: 30 tablet, Rfl: 5 .  benztropine (COGENTIN) 2 MG tablet, Take 1 tablet (2 mg total) by mouth at bedtime. [employer] Dr. Doyne Keel, Disp: , Rfl:  .  haloperidol decanoate (HALDOL DECANOATE) 100 MG/ML injection, Inject 1 mL (100 mg total) into the muscle every 28 (twenty-eight) days. Next injection due on 01/27/18, Disp: 1 mL, Rfl: 0 .  hydrochlorothiazide (HYDRODIURIL) 25 MG tablet, Take 1 tablet (25 mg total)  by mouth daily., Disp: 30 tablet, Rfl: 5 .  ibuprofen (ADVIL,MOTRIN) 200 MG tablet, Take 600 mg by mouth every 8 (eight) hours as needed (for knee pain.)., Disp: , Rfl:  .  lisinopril (PRINIVIL,ZESTRIL) 20 MG tablet, Take 1 tablet (20 mg total) by mouth daily., Disp: 30 tablet, Rfl: 5 .  metFORMIN (GLUCOPHAGE) 500 MG tablet, Take 1 tablet (500 mg total) by mouth 2 (two) times daily with a meal., Disp: 30 tablet, Rfl: 5 .  omeprazole (PRILOSEC) 20 MG capsule, Take 1 capsule (20 mg total) by mouth daily., Disp: 30 capsule, Rfl: 5 .  traZODone (DESYREL) 50 MG tablet, Take 1 tablet (50 mg total) by mouth at bedtime., Disp: 30 tablet, Rfl:  0  Allergies  Allergen Reactions  . Percocet [Oxycodone-Acetaminophen] Diarrhea, Nausea And Vomiting and Nausea Only  . Tramadol Hcl Diarrhea, Nausea And Vomiting and Nausea Only  . Vicodin [Hydrocodone-Acetaminophen] Diarrhea, Nausea And Vomiting and Nausea Only    Review of Systems  Eyes: Negative for blurred vision and double vision.  Respiratory: Negative for cough and sputum production.   Cardiovascular: Negative for chest pain and palpitations.  Gastrointestinal: Negative for nausea and vomiting.  Neurological: Negative for dizziness, tingling, focal weakness and headaches.    No other specific complaints in a complete review of systems (except as listed in HPI above).  Objective  Vitals:   05/07/18 0848 05/07/18 0923 05/07/18 0952  BP: (!) 160/100 124/82 140/90  Pulse: 88    Resp: 16    Temp: 98.1 F (36.7 C)    TempSrc: Oral    SpO2: 97%    Weight: 243 lb (110.2 kg)    Height: 5\' 6"  (1.676 m)      Body mass index is 39.22 kg/m.  Nursing Note and Vital Signs reviewed.  Physical Exam  Constitutional: She is oriented to person, place, and time. She appears well-developed and well-nourished. No distress.  Eyes: Pupils are equal, round, and reactive to light. Conjunctivae are normal.  Cardiovascular: Normal rate, regular rhythm and intact distal pulses.  Pulmonary/Chest: Effort normal and breath sounds normal.  Abdominal: Bowel sounds are normal.  Musculoskeletal: Normal range of motion.  Neurological: She is alert and oriented to person, place, and time. Coordination normal.  Skin: Skin is warm and dry. She is not diaphoretic. No erythema.  Psychiatric: She has a normal mood and affect. Her behavior is normal. Judgment and thought content normal.     No results found for this or any previous visit (from the past 48 hour(s)).  Assessment & Plan  1. Essential hypertension Initial blood pressure checked after increased activity; blood pressure rechecked 2  more times 30 minutes apart with improvement but significant variability; DASH education provided to patient- she has been eating salty foods- will cut down on bologna and salt; increase water intake. Discussed warning signs- unable to check BP at home; will follow-up in 2 days and adjust medications as necessary if not controlled   2. Flu vaccine need - Flu vaccine HIGH DOSE PF      -Red flags and when to present for emergency care or RTC including fever >101.75F, chest pain, blurry vision, dizziness, severe headache, weakness new/worsening/un-resolving symptoms,reviewed with patient at time of visit. Follow up and care instructions discussed and provided in AVS. -Reviewed Health Maintenance: ordered mammogram and gave number for dexa to contact

## 2018-05-08 DIAGNOSIS — Z510 Encounter for antineoplastic radiation therapy: Secondary | ICD-10-CM | POA: Diagnosis not present

## 2018-05-08 DIAGNOSIS — D329 Benign neoplasm of meninges, unspecified: Secondary | ICD-10-CM | POA: Diagnosis not present

## 2018-05-12 ENCOUNTER — Encounter: Payer: Self-pay | Admitting: Nurse Practitioner

## 2018-05-12 ENCOUNTER — Ambulatory Visit (INDEPENDENT_AMBULATORY_CARE_PROVIDER_SITE_OTHER): Payer: Medicare Other | Admitting: Nurse Practitioner

## 2018-05-12 VITALS — BP 140/86 | HR 100 | Temp 98.7°F | Resp 16 | Ht 66.0 in | Wt 242.7 lb

## 2018-05-12 DIAGNOSIS — M25561 Pain in right knee: Secondary | ICD-10-CM | POA: Diagnosis not present

## 2018-05-12 DIAGNOSIS — G8929 Other chronic pain: Secondary | ICD-10-CM

## 2018-05-12 DIAGNOSIS — I10 Essential (primary) hypertension: Secondary | ICD-10-CM

## 2018-05-12 DIAGNOSIS — M25562 Pain in left knee: Secondary | ICD-10-CM

## 2018-05-12 MED ORDER — KNEE COMPRESSION SLEEVE/L/XL MISC: 2.0000 | 0 refills | Status: DC | PRN

## 2018-05-12 NOTE — Patient Instructions (Signed)
-   Thinking of you during your radiation treatments - See if knee sleeve helps give you some extra support on your knees during the day time; can also ice them (not directly on skin just for 10 min or so)after work and heat before bedtime.  - Keep cutting down your portions and losing weight- you are doing great.

## 2018-05-12 NOTE — Progress Notes (Signed)
Name: Lauren [name]   MRN: [id number]    DOB: May 15, 1951   Date:05/12/2018       Progress Note  Subjective  Chief Complaint  Chief Complaint  Patient presents with  . Hypertension    HPI Hypertension Patient states cut out bologna and other salty foods and has been been feeling good overall.   BP Readings from Last 3 Encounters:  05/12/18 140/86  05/07/18 140/90  04/30/18 (!) 154/99   Obesity  Wt Readings from Last 3 Encounters:  05/12/18 242 lb 11.2 oz (110.1 kg)  05/07/18 243 lb (110.2 kg)  04/30/18 241 lb 11.8 oz (109.6 kg)   Has lost a pound since last visit- has been working loosing weight- cut down portion sizes.  Chronic knee pain Does note chronic bilateral knee pain states is worse when getting up and after her day of walking and bending.   Patient Active Problem List   Diagnosis Date Noted  . Schizoaffective disorder ([place]) 12/29/2017  . Morbid obesity ([place]) 12/09/2017  . Meningioma ([place]) 11/25/2017  . Atherosclerosis of aorta ([place]) 11/25/2017  . Calcification of coronary artery 11/25/2017  . Schizophrenia ([place]) 11/04/2017  . Acid reflux 06/29/2015  . Diabetes mellitus type 2, controlled, without complications ([place]) 17/40/8144  . Cardiac murmur 06/29/2015  . Arthritis of knee, degenerative 06/28/2015  . Insomnia w/ sleep apnea 03/21/2015  . Anxiety disorder 03/21/2015  . Hypertension 03/21/2015  . HLD (hyperlipidemia) 03/21/2015  . Urge incontinence 03/21/2015    Past Medical History:  Diagnosis Date  . Anxiety   . Apnea, sleep 02/02/2014  . Awareness of heartbeats 02/02/2014  . Breathlessness on exertion 02/02/2014  . Diabetes mellitus   . Encounter for pre-employment examination 06/29/2015  . Excessive sweating 07/05/2015  . Fibromyalgia   . GERD (gastroesophageal reflux disease)   . Gravida 1 10/26/2015   1.    . Heart murmur   . Herniated disc   . Hyperlipidemia   . Hypertension   . Itch of skin 10/26/2015  . Lack of bladder control    . Lung tumor   . Rheumatoid arthritis ([place])   . Schizoaffective disorder ([place])   . Screening for cervical cancer 07/29/2017  . Seizure [facility])    after brain surgery 2012. last seizure 2013!  [name] Sex counseling 10/26/2015  . Sleep apnea   . 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 Stiffness of both knees 06/22/2015    Past Surgical History:  Procedure Laterality Date  . BRAIN TUMOR EXCISION  2012   benign  . CARDIAC CATHETERIZATION  2008  . CESAREAN SECTION    . CYST REMOVAL HAND    . LUNG LOBECTOMY  1977   benign tumor  . TOTAL KNEE ARTHROPLASTY Left 01/28/2017   Procedure: TOTAL KNEE ARTHROPLASTY;  Surgeon: Corky Mull, MD;  Location: [facility] ORS;  Service: Orthopedics;  Laterality: Left;    Social History   Tobacco Use  . Smoking status: Never Smoker  . Smokeless tobacco: Never Used  Substance Use Topics  . Alcohol use: No    Alcohol/week: 0.0 standard drinks     Current Outpatient Medications:  .  aspirin EC 81 MG tablet, Take 1 tablet (81 mg total) by mouth daily before breakfast., Disp: 30 tablet, Rfl: 5 .  atorvastatin (LIPITOR) 40 MG tablet, Take 1 tablet (40 mg total) by mouth daily at 6 PM., Disp: 30 tablet, Rfl: 5 .  benztropine (COGENTIN) 2 MG tablet, Take 1 tablet (2  mg total) by mouth at bedtime. [employer] Dr. Doyne Keel, Disp: , Rfl:  .  haloperidol decanoate (HALDOL DECANOATE) 100 MG/ML injection, Inject 1 mL (100 mg total) into the muscle every 28 (twenty-eight) days. Next injection due on 01/27/18, Disp: 1 mL, Rfl: 0 .  hydrochlorothiazide (HYDRODIURIL) 25 MG tablet, Take 1 tablet (25 mg total) by mouth daily., Disp: 30 tablet, Rfl: 5 .  ibuprofen (ADVIL,MOTRIN) 200 MG tablet, Take 600 mg by mouth every 8 (eight) hours as needed (for knee pain.)., Disp: , Rfl:  .  lisinopril (PRINIVIL,ZESTRIL) 20 MG tablet, Take 1 tablet (20 mg total) by mouth daily., Disp: 30 tablet, Rfl: 5 .  metFORMIN (GLUCOPHAGE) 500 MG tablet, Take 1 tablet (500 mg  total) by mouth 2 (two) times daily with a meal., Disp: 30 tablet, Rfl: 5 .  omeprazole (PRILOSEC) 20 MG capsule, Take 1 capsule (20 mg total) by mouth daily., Disp: 30 capsule, Rfl: 5 .  traZODone (DESYREL) 50 MG tablet, Take 1 tablet (50 mg total) by mouth at bedtime., Disp: 30 tablet, Rfl: 0  Allergies  Allergen Reactions  . Percocet [Oxycodone-Acetaminophen] Diarrhea, Nausea And Vomiting and Nausea Only  . Tramadol Hcl Diarrhea, Nausea And Vomiting and Nausea Only  . Vicodin [Hydrocodone-Acetaminophen] Diarrhea, Nausea And Vomiting and Nausea Only    Review of Systems  Eyes: Negative for blurred vision and double vision.  Respiratory: Negative for cough and shortness of breath.   Cardiovascular: Negative for palpitations.  Neurological: Negative for dizziness and headaches.    No other specific complaints in a complete review of systems (except as listed in HPI above).  Objective  Vitals:   05/12/18 0926  BP: 140/86  Pulse: 100  Resp: 16  Temp: 98.7 F (37.1 C)  TempSrc: Oral  SpO2: 97%  Weight: 242 lb 11.2 oz (110.1 kg)  Height: 5\' 6"  (1.676 m)    Body mass index is 39.17 kg/m.  Nursing Note and Vital Signs reviewed.  Physical Exam  Constitutional: She is oriented to person, place, and time. She appears well-developed and well-nourished.  Cardiovascular: Normal rate and regular rhythm.  Pulmonary/Chest: Effort normal and breath sounds normal.  Musculoskeletal: Normal range of motion.  Slow, steady gait- waddling   Neurological: She is alert and oriented to person, place, and time.  Skin: Skin is warm and dry. No erythema.  Psychiatric: She has a normal mood and affect. Her behavior is normal. Judgment and thought content normal.    No results found for this or any previous visit (from the past 48 hour(s)).  Assessment & Plan 1. Essential hypertension Controlled today   2. Morbid obesity ([place]) Losing weight   3. Chronic pain of both knees Discussed OTC  management; ice and heat  - Elastic Bandages & Supports (KNEE COMPRESSION SLEEVE/L/XL) MISC; 2 each by Does not apply route as needed. Wear during the day; take off at night.  Dispense: 2 each; Refill: 0

## 2018-05-14 ENCOUNTER — Other Ambulatory Visit: Payer: Self-pay

## 2018-05-14 ENCOUNTER — Other Ambulatory Visit: Payer: Self-pay | Admitting: *Deleted

## 2018-05-14 NOTE — Patient Outreach (Signed)
[facility]) Care Management  05/14/2018  Yohanna [facility] 06-Oct-1950 [id number]   Medication Adherence call to Mrs. Flossie Dibble patient did not answer patient is due on Metformin 500 mg and Atorvastatin 40 mg. [place]  said patient received a bubble pack every week and they deliver every week. Mrs. Kolek is showing past due under [place].   [place] Management Direct Dial [phone]  Fax [phone] [email or web address]

## 2018-05-18 ENCOUNTER — Ambulatory Visit
Admission: RE | Admit: 2018-05-18 | Discharge: 2018-05-18 | Disposition: A | Discharge status: Home / Self Care | Payer: Medicare Other | Source: Ambulatory Visit | Attending: Radiation Oncology | Admitting: Radiation Oncology

## 2018-05-18 DIAGNOSIS — D329 Benign neoplasm of meninges, unspecified: Secondary | ICD-10-CM | POA: Diagnosis not present

## 2018-05-18 DIAGNOSIS — Z510 Encounter for antineoplastic radiation therapy: Secondary | ICD-10-CM | POA: Diagnosis not present

## 2018-05-18 DIAGNOSIS — G4733 Obstructive sleep apnea (adult) (pediatric): Secondary | ICD-10-CM | POA: Diagnosis not present

## 2018-05-19 ENCOUNTER — Ambulatory Visit: Payer: Medicare Other

## 2018-05-19 ENCOUNTER — Ambulatory Visit
Admission: RE | Admit: 2018-05-19 | Discharge: 2018-05-19 | Disposition: A | Discharge status: Home / Self Care | Payer: Medicare Other | Source: Ambulatory Visit | Attending: Radiation Oncology | Admitting: Radiation Oncology

## 2018-05-19 DIAGNOSIS — D329 Benign neoplasm of meninges, unspecified: Secondary | ICD-10-CM | POA: Diagnosis not present

## 2018-05-19 DIAGNOSIS — Z510 Encounter for antineoplastic radiation therapy: Secondary | ICD-10-CM | POA: Diagnosis not present

## 2018-05-20 ENCOUNTER — Ambulatory Visit: Payer: Medicare Other

## 2018-05-20 ENCOUNTER — Ambulatory Visit
Admission: RE | Admit: 2018-05-20 | Discharge: 2018-05-20 | Disposition: A | Discharge status: Home / Self Care | Payer: Medicare Other | Source: Ambulatory Visit | Attending: Radiation Oncology | Admitting: Radiation Oncology

## 2018-05-20 DIAGNOSIS — Z510 Encounter for antineoplastic radiation therapy: Secondary | ICD-10-CM | POA: Diagnosis not present

## 2018-05-20 DIAGNOSIS — D329 Benign neoplasm of meninges, unspecified: Secondary | ICD-10-CM | POA: Diagnosis not present

## 2018-05-21 ENCOUNTER — Ambulatory Visit
Admission: RE | Admit: 2018-05-21 | Discharge: 2018-05-21 | Disposition: A | Discharge status: Home / Self Care | Payer: Medicare Other | Source: Ambulatory Visit | Attending: Radiation Oncology | Admitting: Radiation Oncology

## 2018-05-21 ENCOUNTER — Ambulatory Visit: Payer: Medicare Other

## 2018-05-21 DIAGNOSIS — D329 Benign neoplasm of meninges, unspecified: Secondary | ICD-10-CM | POA: Diagnosis not present

## 2018-05-21 DIAGNOSIS — Z510 Encounter for antineoplastic radiation therapy: Secondary | ICD-10-CM | POA: Diagnosis not present

## 2018-05-22 ENCOUNTER — Ambulatory Visit
Admission: RE | Admit: 2018-05-22 | Discharge: 2018-05-22 | Disposition: A | Discharge status: Home / Self Care | Payer: Medicare Other | Source: Ambulatory Visit | Attending: Radiation Oncology | Admitting: Radiation Oncology

## 2018-05-22 ENCOUNTER — Ambulatory Visit: Payer: Medicare Other

## 2018-05-22 DIAGNOSIS — D329 Benign neoplasm of meninges, unspecified: Secondary | ICD-10-CM | POA: Diagnosis not present

## 2018-05-22 DIAGNOSIS — Z510 Encounter for antineoplastic radiation therapy: Secondary | ICD-10-CM | POA: Diagnosis not present

## 2018-05-25 ENCOUNTER — Ambulatory Visit: Payer: Medicare Other

## 2018-06-15 ENCOUNTER — Other Ambulatory Visit: Payer: Self-pay

## 2018-06-15 ENCOUNTER — Encounter: Payer: Self-pay | Admitting: Radiation Oncology

## 2018-06-15 ENCOUNTER — Ambulatory Visit
Admission: RE | Admit: 2018-06-15 | Discharge: 2018-06-15 | Disposition: A | Discharge status: Home / Self Care | Payer: Medicare Other | Source: Ambulatory Visit | Attending: Radiation Oncology | Admitting: Radiation Oncology

## 2018-06-15 DIAGNOSIS — Z23 Encounter for immunization: Secondary | ICD-10-CM | POA: Diagnosis not present

## 2018-06-15 DIAGNOSIS — D329 Benign neoplasm of meninges, unspecified: Secondary | ICD-10-CM | POA: Diagnosis not present

## 2018-06-15 NOTE — Progress Notes (Signed)
Radiation Oncology Follow up Note  Name: Lauren [name]   Date:   06/15/2018 MRN:  [id number] DOB: [birth year]    This 67 y.o. female presents to the clinic today for one-month follow-up status post radiation therapy for a benign meningioma status post resection in 2012 with progression.Lauren [name]  REFERRING PROVIDER: Steele Sizer, MD  HPI: patient is a 66 year old female now out 1 month having completed partial brain radiation for a meningioma with progression status post resection in 2012. She states her headaches have cleared completely. She's having no focal neurologic deficits. No change in visual fields..  COMPLICATIONS OF TREATMENT: none  FOLLOW UP COMPLIANCE: keeps appointments   PHYSICAL EXAM:  BP (P) 138/85 (BP Location: Right Arm, Patient Position: Sitting)   Pulse (P) 95   Temp (!) (P) 96.4 F (35.8 C) (Tympanic)   Wt (P) 246 lb 7.6 oz (111.8 kg)   BMI (P) 39.78 kg/m  Rude visual fields are within normal range. Motor sensory and DTR levels are equal symmetric upper lower extremities. She has been on cataracts in her eyes.Well-developed well-nourished patient in NAD. HEENT reveals PERLA, EOMI, discs not visualized.  Oral cavity is clear. No oral mucosal lesions are identified. Neck is clear without evidence of cervical or supraclavicular adenopathy. Lungs are clear to A&P. Cardiac examination is essentially unremarkable with regular rate and rhythm without murmur rub or thrill. Abdomen is benign with no organomegaly or masses noted. Motor sensory and DTR levels are equal and symmetric in the upper and lower extremities. Cranial nerves II through XII are grossly intact. Proprioception is intact. No peripheral adenopathy or edema is identified. No motor or sensory levels are noted. Crude visual fields are within normal range.  RADIOLOGY RESULTS: no current films for review  PLAN: present time patient is doing well 1 month out from partial brain radiation for a benign meningioma.  I'm please were overall progress I've asked to see her back in 4-5 months for follow-up. Patient is to call with any concerns at any time. She develop any recurrent headaches I may order a repeat MRI scan of her brain. I would like to take this opportunity to thank you for allowing me to participate in the care of your patient.Noreene Filbert, MD

## 2018-06-23 ENCOUNTER — Encounter: Payer: Self-pay | Admitting: Nurse Practitioner

## 2018-06-23 ENCOUNTER — Emergency Department: Payer: Medicare Other

## 2018-06-23 ENCOUNTER — Ambulatory Visit: Payer: Self-pay

## 2018-06-23 ENCOUNTER — Ambulatory Visit (INDEPENDENT_AMBULATORY_CARE_PROVIDER_SITE_OTHER): Payer: Medicare Other | Admitting: Nurse Practitioner

## 2018-06-23 ENCOUNTER — Encounter: Payer: Self-pay | Admitting: Emergency Medicine

## 2018-06-23 ENCOUNTER — Emergency Department
Admission: EM | Admit: 2018-06-23 | Discharge: 2018-06-23 | Disposition: A | Discharge status: Home / Self Care | Payer: Medicare Other | Attending: Emergency Medicine | Admitting: Emergency Medicine

## 2018-06-23 ENCOUNTER — Other Ambulatory Visit: Payer: Self-pay

## 2018-06-23 VITALS — BP 138/86 | HR 105 | Temp 98.2°F | Resp 14 | Ht 65.0 in | Wt 253.2 lb

## 2018-06-23 DIAGNOSIS — D329 Benign neoplasm of meninges, unspecified: Secondary | ICD-10-CM | POA: Diagnosis not present

## 2018-06-23 DIAGNOSIS — R202 Paresthesia of skin: Secondary | ICD-10-CM

## 2018-06-23 DIAGNOSIS — Z79899 Other long term (current) drug therapy: Secondary | ICD-10-CM | POA: Insufficient documentation

## 2018-06-23 DIAGNOSIS — I10 Essential (primary) hypertension: Secondary | ICD-10-CM | POA: Insufficient documentation

## 2018-06-23 DIAGNOSIS — N200 Calculus of kidney: Secondary | ICD-10-CM | POA: Diagnosis not present

## 2018-06-23 DIAGNOSIS — Z7984 Long term (current) use of oral hypoglycemic drugs: Secondary | ICD-10-CM | POA: Diagnosis not present

## 2018-06-23 DIAGNOSIS — R200 Anesthesia of skin: Secondary | ICD-10-CM | POA: Diagnosis not present

## 2018-06-23 DIAGNOSIS — E119 Type 2 diabetes mellitus without complications: Secondary | ICD-10-CM | POA: Insufficient documentation

## 2018-06-23 DIAGNOSIS — Z7982 Long term (current) use of aspirin: Secondary | ICD-10-CM | POA: Diagnosis not present

## 2018-06-23 DIAGNOSIS — G936 Cerebral edema: Secondary | ICD-10-CM

## 2018-06-23 DIAGNOSIS — R4781 Slurred speech: Secondary | ICD-10-CM | POA: Diagnosis not present

## 2018-06-23 LAB — CBC
HCT: 36 % (ref 36.0–46.0)
Hemoglobin: 12.4 g/dL (ref 12.0–15.0)
MCH: 31.6 pg (ref 26.0–34.0)
MCHC: 34.4 g/dL (ref 30.0–36.0)
MCV: 91.6 fL (ref 80.0–100.0)
Platelets: 239 10*3/uL (ref 150–400)
RBC: 3.93 MIL/uL (ref 3.87–5.11)
RDW: 13.9 % (ref 11.5–15.5)
WBC: 7.4 10*3/uL (ref 4.0–10.5)
nRBC: 0 % (ref 0.0–0.2)

## 2018-06-23 LAB — COMPREHENSIVE METABOLIC PANEL
ALT: 17 U/L (ref 0–44)
AST: 26 U/L (ref 15–41)
Albumin: 4 g/dL (ref 3.5–5.0)
Alkaline Phosphatase: 62 U/L (ref 38–126)
Anion gap: 10 (ref 5–15)
BUN: 17 mg/dL (ref 8–23)
CO2: 26 mmol/L (ref 22–32)
Calcium: 9.2 mg/dL (ref 8.9–10.3)
Chloride: 104 mmol/L (ref 98–111)
Creatinine, Ser: 0.76 mg/dL (ref 0.44–1.00)
GFR calc Af Amer: >60 mL/min (ref >60)
GFR calc non Af Amer: >60 mL/min (ref >60)
Glucose, Bld: 121 mg/dL — ABNORMAL HIGH (ref 70–99)
Potassium: 3.5 mmol/L (ref 3.5–5.1)
Sodium: 140 mmol/L (ref 135–145)
Total Bilirubin: 0.7 mg/dL (ref 0.3–1.2)
Total Protein: 7.1 g/dL (ref 6.5–8.1)

## 2018-06-23 LAB — DIFFERENTIAL
Abs Immature Granulocytes: 0.03 10*3/uL (ref 0.00–0.07)
Basophils Absolute: 0.1 10*3/uL (ref 0.0–0.1)
Basophils Relative: 1 %
Eosinophils Absolute: 0.1 10*3/uL (ref 0.0–0.5)
Eosinophils Relative: 1 %
Immature Granulocytes: 0 %
Lymphocytes Relative: 23 %
Lymphs Abs: 1.7 10*3/uL (ref 0.7–4.0)
Monocytes Absolute: 0.5 10*3/uL (ref 0.1–1.0)
Monocytes Relative: 6 %
Neutro Abs: 5.1 10*3/uL (ref 1.7–7.7)
Neutrophils Relative %: 69 %

## 2018-06-23 LAB — PROTIME-INR
INR: 0.96
Prothrombin Time: 12.7 seconds (ref 11.4–15.2)

## 2018-06-23 LAB — APTT: aPTT: 29 seconds (ref 24–36)

## 2018-06-23 LAB — GLUCOSE, CAPILLARY: Glucose-Capillary: 118 mg/dL — ABNORMAL HIGH (ref 70–99)

## 2018-06-23 LAB — TROPONIN I: Troponin I: <0.03 ng/mL (ref <0.03)

## 2018-06-23 NOTE — Progress Notes (Signed)
Name: Lauren [name]   MRN: [id number]    DOB: 1950-09-30   Date:06/23/2018       Progress Note  Subjective  Chief Complaint  Chief Complaint  Patient presents with  . Numbness    down left side radiating down leg, pt states came and gone and has hx of seizure like this before    HPI  Patient states she had a seizure at 3 am this morning. States last time she had this was in 2013. States her "seizure activity" consists of numbness that starts from left face and neck down to left hands and left leg and her hand and leg were shaking and she couldn't control it. Thinks it lasted for about 5-6 minutes. Numbness is still present in face.  Has neurologist- sees Dr. Melrose Nakayama; was on seizure medicine in the past but just stopped.  Is getting partial radiation treatment for for meningioma; last treatment was 05/18/2018- visit with oncologist last week noting positive progress.   Patient Active Problem List   Diagnosis Date Noted  . Schizoaffective disorder ([place]) 12/29/2017  . Morbid obesity ([place]) 12/09/2017  . Meningioma ([place]) 11/25/2017  . Atherosclerosis of aorta ([place]) 11/25/2017  . Calcification of coronary artery 11/25/2017  . Schizophrenia ([place]) 11/04/2017  . Acid reflux 06/29/2015  . Diabetes mellitus type 2, controlled, without complications ([place]) 85/63/1497  . Cardiac murmur 06/29/2015  . Arthritis of knee, degenerative 06/28/2015  . Insomnia w/ sleep apnea 03/21/2015  . Anxiety disorder 03/21/2015  . Hypertension 03/21/2015  . HLD (hyperlipidemia) 03/21/2015  . Urge incontinence 03/21/2015    Past Medical History:  Diagnosis Date  . Anxiety   . Apnea, sleep 02/02/2014  . Awareness of heartbeats 02/02/2014  . Breathlessness on exertion 02/02/2014  . Diabetes mellitus   . Encounter for pre-employment examination 06/29/2015  . Excessive sweating 07/05/2015  . Fibromyalgia   . GERD (gastroesophageal reflux disease)   . Gravida 1 10/26/2015   1.    . Heart murmur   .  Herniated disc   . Hyperlipidemia   . Hypertension   . Itch of skin 10/26/2015  . Lack of bladder control   . Lung tumor   . Rheumatoid arthritis ([place])   . Schizoaffective disorder ([place])   . Screening for cervical cancer 07/29/2017  . Seizure [facility])    after brain surgery 2012. last seizure 2013!  [name] Sex counseling 10/26/2015  . Sleep apnea   . 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 Stiffness of both knees 06/22/2015    Past Surgical History:  Procedure Laterality Date  . BRAIN TUMOR EXCISION  2012   benign  . CARDIAC CATHETERIZATION  2008  . CESAREAN SECTION    . CYST REMOVAL HAND    . LUNG LOBECTOMY  1977   benign tumor  . TOTAL KNEE ARTHROPLASTY Left 01/28/2017   Procedure: TOTAL KNEE ARTHROPLASTY;  Surgeon: Corky Mull, MD;  Location: [facility] ORS;  Service: Orthopedics;  Laterality: Left;    Social History   Tobacco Use  . Smoking status: Never Smoker  . Smokeless tobacco: Never Used  Substance Use Topics  . Alcohol use: No    Alcohol/week: 0.0 standard drinks     Current Outpatient Medications:  .  aspirin EC 81 MG tablet, Take 1 tablet (81 mg total) by mouth daily before breakfast., Disp: 30 tablet, Rfl: 5 .  atorvastatin (LIPITOR) 40 MG tablet, Take 1 tablet (40 mg total) by mouth daily at 6  PM., Disp: 30 tablet, Rfl: 5 .  benztropine (COGENTIN) 2 MG tablet, Take 1 tablet (2 mg total) by mouth at bedtime. [employer] Dr. Doyne Keel, Disp: , Rfl:  .  haloperidol decanoate (HALDOL DECANOATE) 100 MG/ML injection, Inject 1 mL (100 mg total) into the muscle every 28 (twenty-eight) days. Next injection due on 01/27/18, Disp: 1 mL, Rfl: 0 .  hydrochlorothiazide (HYDRODIURIL) 25 MG tablet, Take 1 tablet (25 mg total) by mouth daily., Disp: 30 tablet, Rfl: 5 .  ibuprofen (ADVIL,MOTRIN) 200 MG tablet, Take 600 mg by mouth every 8 (eight) hours as needed (for knee pain.)., Disp: , Rfl:  .  lisinopril (PRINIVIL,ZESTRIL) 20 MG tablet, Take 1 tablet (20 mg  total) by mouth daily., Disp: 30 tablet, Rfl: 5 .  metFORMIN (GLUCOPHAGE) 500 MG tablet, Take 1 tablet (500 mg total) by mouth 2 (two) times daily with a meal., Disp: 30 tablet, Rfl: 5 .  omeprazole (PRILOSEC) 20 MG capsule, Take 1 capsule (20 mg total) by mouth daily., Disp: 30 capsule, Rfl: 5 .  traZODone (DESYREL) 50 MG tablet, Take 1 tablet (50 mg total) by mouth at bedtime., Disp: 30 tablet, Rfl: 0 .  Elastic Bandages & Supports (KNEE COMPRESSION SLEEVE/L/XL) MISC, 2 each by Does not apply route as needed. Wear during day; take off at night, Disp: 2 each, Rfl: 0  Allergies  Allergen Reactions  . Percocet [Oxycodone-Acetaminophen] Diarrhea, Nausea And Vomiting and Nausea Only  . Tramadol Hcl Diarrhea, Nausea And Vomiting and Nausea Only  . Vicodin [Hydrocodone-Acetaminophen] Diarrhea, Nausea And Vomiting and Nausea Only    Review of Systems  Constitutional: Negative for chills, fever and malaise/fatigue.  Respiratory: Negative for shortness of breath.   Cardiovascular: Negative for chest pain.  Gastrointestinal: Negative for abdominal pain.  Musculoskeletal: Negative for myalgias.  Neurological: Positive for tremors and sensory change. Negative for dizziness, weakness and headaches.  Psychiatric/Behavioral: Negative for depression. The patient is nervous/anxious.      No other specific complaints in a complete review of systems (except as listed in HPI above).  Objective  Vitals:   06/23/18 1103  BP: 138/86  Pulse: (!) 105  Resp: 14  Temp: 98.2 F (36.8 C)  TempSrc: Oral  SpO2: 96%  Weight: 253 lb 3.2 oz (114.9 kg)  Height: 5\' 5"  (1.651 m)     Body mass index is 42.13 kg/m.  Nursing Note and Vital Signs reviewed.  Physical Exam  Constitutional: She is oriented to person, place, and time. She appears well-developed and well-nourished.  HENT:  Head: Normocephalic.  Eyes: Pupils are equal, round, and reactive to light. Conjunctivae and EOM are normal.  Neck:  Normal range of motion. Neck supple.  Cardiovascular: Normal heart sounds and intact distal pulses.  Mildly elevated rate   Pulmonary/Chest: Effort normal and breath sounds normal.  Abdominal: Soft. Bowel sounds are normal.  Neurological: She is alert and oriented to person, place, and time. GCS eye subscore is 4. GCS verbal subscore is 5. GCS motor subscore is 6.  No facial droop Equal strength in upper extremities, some lower left leg drift noted patient notes it is due to pain in left knee. Coordination intact No limb ataxia Patient notes numbness in left face.  No nystagmus   Skin: Skin is warm and dry. No erythema.  Psychiatric: She has a normal mood and affect. Her behavior is normal. Judgment and thought content normal.      No results found for this or any previous visit (from the past  48 hour(s)).  Assessment & Plan  1. Left sided numbness Discussed with PCP recommend ER- patient declined EMS transport- informed she could not drive- courtesy car took patient. ER informed.   2. Meningioma ([place])

## 2018-06-23 NOTE — Discharge Instructions (Signed)
Your CT scan and labs today were unremarkable.  Your MRI of the brain shows a small amount of swelling in the area of your meningioma.  This appears to be a chronic finding compared to your CT scan earlier this year.  We discussed with neurosurgery who does not feel that any new medications or other changes are needed at this time.  Please follow-up with neurology as scheduled.

## 2018-06-23 NOTE — Telephone Encounter (Signed)
Pt. Report around 3 a.m. She had numbness in her left side - face arm and leg. Also had "shaking all over " with this. It lasted 3-4 minutes. She did not lose consciousness. Feels "normal" this morning - "I have already had my shower." Requests appointment. States she had her last radiology treatment last month for her meningioma.Reports she had seizure activity after surgery in 2012 for removal or the tumor. States "I'm not going to the emergency room - that's only for emergencies." Appointment made for today after speaking with Melissa . Instructed pt. If this reoccurs before her appointment, to go to ED.  Reason for Disposition . [1] Numbness or tingling on both sides of body AND [2] is a new symptom present > 24 hours  Answer Assessment - Initial Assessment Questions 1. SYMPTOM: "What is the main symptom you are concerned about?" (e.g., weakness, numbness)     I had numbness on my left side of my face arm and leg last night. 2. ONSET: "When did this start?" (minutes, hours, days; while sleeping)     Last night 3. LAST NORMAL: "When was the last time you were normal (no symptoms)?"     I feel normal now. 4. PATTERN "Does this come and go, or has it been constant since it started?"  "Is it present now?"     It just happened last night 5. CARDIAC SYMPTOMS: "Have you had any of the following symptoms: chest pain, difficulty breathing, palpitations?"     No 6. NEUROLOGIC SYMPTOMS: "Have you had any of the following symptoms: headache, dizziness, vision loss, double vision, changes in speech, unsteady on your feet?"     No 7. OTHER SYMPTOMS: "Do you have any other symptoms?"     I was shaking all over when it happened. 8. PREGNANCY: "Is there any chance you are pregnant?" "When was your last menstrual period?"     No  Protocols used: NEUROLOGIC DEFICIT-A-AH

## 2018-06-23 NOTE — Telephone Encounter (Signed)
Please review

## 2018-06-23 NOTE — ED Triage Notes (Signed)
Pt to ED sent from PCP with c/o LFT sided numbness to face that began this am around 0300 upon awakening. Pt speech delayed at time, states that is her baseline. PT had a benign  meningoma on her brain. PT A&OX4

## 2018-06-23 NOTE — ED Provider Notes (Signed)
[facility] Emergency Department Provider Note  ____________________________________________  Time seen: Approximately 6:32 PM  I have reviewed the triage vital signs and the nursing notes.   HISTORY  Chief Complaint Numbness    HPI Lauren [name] is a 67 y.o. female with a history of anxiety diabetes fibromyalgia hypertension schizophrenia and prior meningioma resection, now with recurrent meningioma in the left frontal region.  She complains of left facial numbness that she noticed when waking up this morning at 3:00 AM.  She reports that it lasted for about 5 seconds and then seem to get better.  She reports that it is still improved at this time.  No other acute neurologic symptoms.  Denies any extremity weakness or paresthesia.  No falls or trauma.  No vision changes.  She has been having occasional headaches that she controls with ibuprofen, not worsening, not new.  She also has chronic tremor.  She was scheduled to see neurology within the last few days but canceled the appointment due to not having the money for the co-pay.  She recently had radiation treatments to help with her meningioma symptoms.   Review of electronic medical record shows that her radiation oncologist had planned to get an MRI if she had worsening headaches or other worsening symptoms.  She notes that she was on antiepileptics after her prior meningioma resection in 2012, but she stopped taking that within 6 months of that time.  She has been off of it for years now.   Past Medical History:  Diagnosis Date  . Anxiety   . Apnea, sleep 02/02/2014  . Awareness of heartbeats 02/02/2014  . Breathlessness on exertion 02/02/2014  . Diabetes mellitus   . Encounter for pre-employment examination 06/29/2015  . Excessive sweating 07/05/2015  . Fibromyalgia   . GERD (gastroesophageal reflux disease)   . Gravida 1 10/26/2015   1.    . Heart murmur   . Herniated disc   .  Hyperlipidemia   . Hypertension   . Itch of skin 10/26/2015  . Lack of bladder control   . Lung tumor   . Rheumatoid arthritis ([place])   . Schizoaffective disorder ([place])   . Screening for cervical cancer 07/29/2017  . Seizure [facility])    after brain surgery 2012. last seizure 2013!  [name] Sex counseling 10/26/2015  . Sleep apnea   . 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 Stiffness of both knees 06/22/2015     Patient Active Problem List   Diagnosis Date Noted  . Schizoaffective disorder ([place]) 12/29/2017  . Morbid obesity ([place]) 12/09/2017  . Meningioma ([place]) 11/25/2017  . Atherosclerosis of aorta ([place]) 11/25/2017  . Calcification of coronary artery 11/25/2017  . Schizophrenia ([place]) 11/04/2017  . Acid reflux 06/29/2015  . Diabetes mellitus type 2, controlled, without complications ([place]) 79/15/0569  . Cardiac murmur 06/29/2015  . Arthritis of knee, degenerative 06/28/2015  . Insomnia w/ sleep apnea 03/21/2015  . Anxiety disorder 03/21/2015  . Hypertension 03/21/2015  . HLD (hyperlipidemia) 03/21/2015  . Urge incontinence 03/21/2015     Past Surgical History:  Procedure Laterality Date  . BRAIN TUMOR EXCISION  2012   benign  . CARDIAC CATHETERIZATION  2008  . CESAREAN SECTION    . CYST REMOVAL HAND    . LUNG LOBECTOMY  1977   benign tumor  . TOTAL KNEE ARTHROPLASTY Left 01/28/2017   Procedure: TOTAL KNEE ARTHROPLASTY;  Surgeon: Corky Mull, MD;  Location: [facility] ORS;  Service: Orthopedics;  Laterality: Left;     Prior to Admission medications   Medication Sig Start Date End Date Taking? Authorizing Provider  aspirin EC 81 MG tablet Take 1 tablet (81 mg total) by mouth daily before breakfast. 04/01/18   Ancil Boozer, Drue Stager, MD  atorvastatin (LIPITOR) 40 MG tablet Take 1 tablet (40 mg total) by mouth daily at 6 PM. 04/01/18   Ancil Boozer, Drue Stager, MD  benztropine (COGENTIN) 2 MG tablet Take 1 tablet (2 mg total) by mouth at bedtime. [employer] Dr. Doyne Keel  04/03/18   Steele Sizer, MD  Elastic Bandages & Supports (KNEE COMPRESSION SLEEVE/L/XL) MISC 2 each by Does not apply route as needed. Wear during day; take off at night 05/12/18   Poulose, Bethel Born, NP  haloperidol decanoate (HALDOL DECANOATE) 100 MG/ML injection Inject 1 mL (100 mg total) into the muscle every 28 (twenty-eight) days. Next injection due on 01/27/18 01/27/18   Marylin Crosby, MD  hydrochlorothiazide (HYDRODIURIL) 25 MG tablet Take 1 tablet (25 mg total) by mouth daily. 04/01/18   Steele Sizer, MD  ibuprofen (ADVIL,MOTRIN) 200 MG tablet Take 600 mg by mouth every 8 (eight) hours as needed (for knee pain.).    [provider]  lisinopril (PRINIVIL,ZESTRIL) 20 MG tablet Take 1 tablet (20 mg total) by mouth daily. 04/01/18   Steele Sizer, MD  metFORMIN (GLUCOPHAGE) 500 MG tablet Take 1 tablet (500 mg total) by mouth 2 (two) times daily with a meal. 04/01/18   Ancil Boozer, Drue Stager, MD  omeprazole (PRILOSEC) 20 MG capsule Take 1 capsule (20 mg total) by mouth daily. 04/01/18   Steele Sizer, MD  traZODone (DESYREL) 50 MG tablet Take 1 tablet (50 mg total) by mouth at bedtime. 01/12/18   McNew, Tyson Babinski, MD     Allergies Percocet [oxycodone-acetaminophen]; Tramadol hcl; and Vicodin [hydrocodone-acetaminophen]   Family History  Problem Relation Age of Onset  . Heart disease Brother   . Depression Mother   . Heart attack Mother   . Stroke Mother   . Alcohol abuse Father   . Stroke Father   . Diabetes Sister   . Diabetes Sister   . Stomach cancer Sister   . Kidney disease Sister   . COPD Brother   . Lung cancer Brother   . Diabetes Brother     Social History Social History   Tobacco Use  . Smoking status: Never Smoker  . Smokeless tobacco: Never Used  Substance Use Topics  . Alcohol use: No    Alcohol/week: 0.0 standard drinks  . Drug use: No    Review of Systems  Constitutional:   No fever or chills.  ENT:   No sore throat. No rhinorrhea. Cardiovascular:    No chest pain or syncope. Respiratory:   No dyspnea or cough. Gastrointestinal:   Negative for abdominal pain, vomiting and diarrhea.  Musculoskeletal:   Negative for focal pain or swelling All other systems reviewed and are negative except as documented above in ROS and HPI.  ____________________________________________   PHYSICAL EXAM:  VITAL SIGNS: ED Triage Vitals [06/23/18 1154]  Enc Vitals Group     BP (!) 184/94     Pulse Rate 82     Resp 16     Temp 97.7 F (36.5 C)     Temp Source Oral     SpO2 97 %     Weight      Height      Head Circumference      Peak Flow  Pain Score      Pain Loc      Pain Edu?      Excl. in Ord?     Vital signs reviewed, nursing assessments reviewed.   Constitutional:   Alert and oriented. Non-toxic appearance. Eyes:   Conjunctivae are normal. EOMI. PERRL. ENT      Head:   Normocephalic and atraumatic.      Nose:   No congestion/rhinnorhea.       Mouth/Throat:   MMM, no pharyngeal erythema. No peritonsillar mass.       Neck:   No meningismus. Full ROM. Hematological/Lymphatic/Immunilogical:   No cervical lymphadenopathy. Cardiovascular:   RRR. Symmetric bilateral radial and DP pulses.  No murmurs. Cap refill less than 2 seconds. Respiratory:   Normal respiratory effort without tachypnea/retractions. Breath sounds are clear and equal bilaterally. No wheezes/rales/rhonchi. Gastrointestinal:   Soft and nontender. Non distended. There is no CVA tenderness.  No rebound, rigidity, or guarding. Musculoskeletal:   Normal range of motion in all extremities. No joint effusions.  No lower extremity tenderness.  No edema. Neurologic:   Normal speech and language.  Cranial nerves III through XII intact Motor grossly intact. No pronator drift.  Baseline gait, limited by knee pain. No acute focal neurologic deficits are appreciated.  Skin:    Skin is warm, dry and intact. No rash noted.  No petechiae, purpura, or  bullae.  ____________________________________________    LABS (pertinent positives/negatives) (all labs ordered are listed, but only abnormal results are displayed) Labs Reviewed  COMPREHENSIVE METABOLIC PANEL - Abnormal; Notable for the following components:      Result Value   Glucose, Bld 121 (*)    All other components within normal limits  GLUCOSE, CAPILLARY - Abnormal; Notable for the following components:   Glucose-Capillary 118 (*)    All other components within normal limits  PROTIME-INR  APTT  CBC  DIFFERENTIAL  TROPONIN I  CBG MONITORING, ED   ____________________________________________   EKG  Interpreted by me Normal sinus rhythm rate of 81, left axis, normal intervals.  Normal QRS ST segments and T waves.  Voltage criteria for LVH in the high lateral leads.  ____________________________________________    RADIOLOGY  Ct Head Wo Contrast  Result Date: 06/23/2018 CLINICAL DATA:  Numbness tingling paresthesia. Prior meningioma resection EXAM: CT HEAD WITHOUT CONTRAST TECHNIQUE: Contiguous axial images were obtained from the base of the skull through the vertex without intravenous contrast. COMPARISON:  CT head with contrast 11/06/2017 FINDINGS: Brain: Ventricle size normal. Negative for acute infarct or hemorrhage. Mild chronic microvascular ischemic change in the white matter. Left frontal craniotomy for meningioma resection. 2 cm recurrent meningioma with surrounding edema is similar to the prior study. Prior study showed the lesion enhances homogeneously. Vascular: Atherosclerotic calcification. Negative for hyperdense vessel Skull: No acute skeletal abnormality.  Left frontal craniotomy. Sinuses/Orbits: Negative Other: None IMPRESSION: Left frontal craniotomy for meningioma resection. Recurrent meningioma similar to the prior CT. No acute infarct or hemorrhage. Electronically Signed   By: Franchot Gallo M.D.   On: 06/23/2018 12:35   Mr Brain Wo Contrast  Result  Date: 06/23/2018 CLINICAL DATA:  Numbness of the left side of the face beginning today. Some speech disturbance. History of meningioma. EXAM: MRI HEAD WITHOUT CONTRAST TECHNIQUE: Multiplanar, multiecho pulse sequences of the brain and surrounding structures were obtained without intravenous contrast. COMPARISON:  CT earlier same day.  MRI 03/21/2011. FINDINGS: Brain: Diffusion imaging does not show any acute or subacute infarction. The brainstem and cerebellum  are normal. Cerebral hemispheres show moderate changes of chronic small vessel disease affecting the hemispheric white matter. There has been left frontoparietal vertex craniotomy for meningioma resection. There is residual/recurrent tumor measuring 2 cm in diameter, with a broad surface along the superior sagittal sinus but no evidence of sinus invasion or occlusion. There is some vasogenic edema in the left frontoparietal region, less than seen on the study of 2012 however. No hemorrhage, hydrocephalus or extra-axial collection. Vascular: Major vessels at the base of the brain show flow. Skull and upper cervical spine: Negative otherwise Sinuses/Orbits: Clear/normal. Small amount of mastoid fluid on the right. Other: None IMPRESSION: No sign of acute or subacute infarction. Moderate chronic small-vessel ischemic changes affecting the cerebral hemispheric white matter. Residual/recurrent meningioma at the medial left frontoparietal vertex measuring 2 cm in diameter. Moderate amount of vasogenic edema in the white matter of the region. Electronically Signed   By: [name] Chimes M.D.   On: 06/23/2018 19:22    ____________________________________________   PROCEDURES Procedures  ____________________________________________  DIFFERENTIAL DIAGNOSIS   Stroke, symptomatic meningioma, radiation therapy side effect, subjective paresthesia  CLINICAL IMPRESSION / ASSESSMENT AND PLAN / ED COURSE  Pertinent labs & imaging results that were available during  my care of the patient were reviewed by me and considered in my medical decision making (see chart for details).    Attempted to reach neurology but was unable to get a call back over the span of 2 hours, so proceeding with MRI is suggested by radiation oncology notes for any worsening symptoms.  Clinical Course as of Jun 23 2256  Tue Jun 23, 2018  1818 C/o subjective left facial numbness that she noticed when waking up 3am last night. No other acute sx. Doubt stroke, ICH, IIH, glaucoma. Possible focal seizure. Due to prior mengioma resection, now recurrent, recent radiation treatments, will obtain MRI brain to eval. CT head negative.    [PS]    Clinical Course User Index [PS] Carrie Mew, MD     ----------------------------------------- 10:57 PM on 06/23/2018 -----------------------------------------  Discussed with neurosurgery who notes that finding of vasogenic edema is somewhat anticipated after radiation therapy.  Additionally, there is evidence that this edema was present in February 2019 on CT scan.  Appears to be a chronic finding, no acute interventions, steroids not recommended.  No antiepileptics, follow-up with neurology as scheduled.  ____________________________________________   FINAL CLINICAL IMPRESSION(S) / ED DIAGNOSES    Final diagnoses:  Paresthesia  Vasogenic brain edema [facility])     ED Discharge Orders    None      Portions of this note were generated with dragon dictation software. Dictation errors may occur despite best attempts at proofreading.    Carrie Mew, MD 06/23/18 2258

## 2018-06-23 NOTE — Patient Instructions (Signed)
Dr. Ancil Boozer and I recommend you go to the Emergency Department for evaluation of your left sided numbness and seizure like activity, especially since you are still having numbness on your left side that just started this morning at 3 am.

## 2018-07-15 DIAGNOSIS — Z9889 Other specified postprocedural states: Secondary | ICD-10-CM | POA: Diagnosis not present

## 2018-07-15 DIAGNOSIS — R251 Tremor, unspecified: Secondary | ICD-10-CM | POA: Diagnosis not present

## 2018-07-15 DIAGNOSIS — Z8603 Personal history of neoplasm of uncertain behavior: Secondary | ICD-10-CM | POA: Diagnosis not present

## 2018-07-21 DIAGNOSIS — Z8603 Personal history of neoplasm of uncertain behavior: Secondary | ICD-10-CM

## 2018-07-21 DIAGNOSIS — R251 Tremor, unspecified: Secondary | ICD-10-CM | POA: Insufficient documentation

## 2018-07-21 DIAGNOSIS — Z9889 Other specified postprocedural states: Secondary | ICD-10-CM | POA: Insufficient documentation

## 2018-08-03 ENCOUNTER — Ambulatory Visit (INDEPENDENT_AMBULATORY_CARE_PROVIDER_SITE_OTHER): Payer: Medicare Other | Admitting: Family Medicine

## 2018-08-03 ENCOUNTER — Ambulatory Visit: Payer: Self-pay | Admitting: Pharmacist

## 2018-08-03 ENCOUNTER — Encounter: Payer: Self-pay | Admitting: Family Medicine

## 2018-08-03 VITALS — BP 132/74 | HR 92 | Temp 97.4°F | Resp 16 | Ht 65.0 in | Wt 242.9 lb

## 2018-08-03 DIAGNOSIS — E782 Mixed hyperlipidemia: Secondary | ICD-10-CM

## 2018-08-03 DIAGNOSIS — D320 Benign neoplasm of cerebral meninges: Secondary | ICD-10-CM

## 2018-08-03 DIAGNOSIS — Z598 Other problems related to housing and economic circumstances: Secondary | ICD-10-CM

## 2018-08-03 DIAGNOSIS — I2510 Atherosclerotic heart disease of native coronary artery without angina pectoris: Secondary | ICD-10-CM

## 2018-08-03 DIAGNOSIS — E1169 Type 2 diabetes mellitus with other specified complication: Secondary | ICD-10-CM | POA: Diagnosis not present

## 2018-08-03 DIAGNOSIS — E669 Obesity, unspecified: Secondary | ICD-10-CM | POA: Diagnosis not present

## 2018-08-03 DIAGNOSIS — F200 Paranoid schizophrenia: Secondary | ICD-10-CM

## 2018-08-03 DIAGNOSIS — I10 Essential (primary) hypertension: Secondary | ICD-10-CM

## 2018-08-03 DIAGNOSIS — I700 Atherosclerosis of aorta: Secondary | ICD-10-CM

## 2018-08-03 DIAGNOSIS — Z599 Problem related to housing and economic circumstances, unspecified: Secondary | ICD-10-CM

## 2018-08-03 DIAGNOSIS — I2584 Coronary atherosclerosis due to calcified coronary lesion: Secondary | ICD-10-CM

## 2018-08-03 LAB — POCT GLYCOSYLATED HEMOGLOBIN (HGB A1C): HbA1c, POC (controlled diabetic range): 6.3 % (ref 0.0–7.0)

## 2018-08-03 NOTE — Patient Instructions (Signed)
Ms. Ganger was given information about Chronic Care Management services today including:  1. CCM service includes personalized support from designated clinical staff supervised by her physician, including individualized plan of care and coordination with other care providers 2. 24/7 contact phone numbers for assistance for urgent and routine care needs. 3. Service will only be billed when office clinical staff spend 20 minutes or more in a month to coordinate care. 4. Only one practitioner may furnish and bill the service in a calendar month. 5. The patient may stop CCM services at any time (effective at the end of the month) by phone call to the office staff. 6. The patient will be responsible for cost sharing (co-pay) of up to 20% of the service fee (after annual deductible is met).  Patient agreed to services and verbal consent obtained.   Please call a member of the CCM (Chronic Care Management) Team with any questions or case management needs:   Vanetta Mulders, BSN Nurse Care Coordinator  [phone]  Ruben Reason, PharmD  Clinical Pharmacist  [phone]

## 2018-08-03 NOTE — Patient Instructions (Addendum)
Mammogram resource:   Breast and Cervical Cancer Control Program (BCCCP) [place] [address]  Monday through Thursday, 8 am - 5 pm Friday, 8 am - 3 pm  [phone]   Please call a member of the CCM (Chronic Care Management) Team with any questions or case management needs:   Vanetta Mulders, BSN Nurse Care Coordinator  [phone]  Ruben Reason, PharmD  Clinical Pharmacist  [phone]   Bring ALL medicines to your appointment!

## 2018-08-03 NOTE — Chronic Care Management (AMB) (Signed)
  Chronic Care Management   Note  08/03/2018 Name: Lauren [name] MRN: [id number] DOB: December 18, 1950   Lauren [name] is a 67 y.o. year old female in the office today for a follow up visit with Lauren Sizer, MD for diabetes type 2.  Dr. Ancil Boozer asked the CCM team to consult today for assistance with chronic disease management related to community resources and education surrounding medical conditions. Lauren [name] agreed that CCM services would be helpful to them.    Plan: I have scheduled an office visit with [name] next week. She has agreed to begin CCM services.    Lauren [name] was given information about Chronic Care Management services today including:  1. CCM service includes personalized support from designated clinical staff supervised by her physician, including individualized plan of care and coordination with other care providers 2. 24/7 contact phone numbers for assistance for urgent and routine care needs. 3. Service will only be billed when office clinical staff spend 20 minutes or more in a month to coordinate care. 4. Only one practitioner may furnish and bill the service in a calendar month. 5. The patient may stop CCM services at any time (effective at the end of the month) by phone call to the office staff. 6. The patient will be responsible for cost sharing (co-pay) of up to 20% of the service fee (after annual deductible is met).   Patient agreed to services and verbal consent obtained.  Ruben Reason, PharmD Clinical Pharmacist [facility] [phone]

## 2018-08-03 NOTE — Progress Notes (Signed)
Name: Keron Koffman   MRN: [id number]    DOB: 10/24/1950   Date:08/03/2018       Progress Note  Subjective  Chief Complaint  Chief Complaint  Patient presents with  . Medication Refill  . Diabetes    Does not check her sugar at home  . Hypertension    Denies any symptoms  . Hyperlipidemia  . Schizophrenia    Seeing Dr. Loni Muse at [facility] in [place] just had her shot last Friday-they will come out to her house if she is unable to come in.  . Dry Mouth    Thinks it is from the Cogentin and states it is giving her a white tongue  . Tremors    HPI  Recurrence of Meningioma: resection done in 2012 with developed of left side numbness, she went to [facility] and CT showed recurrence 01/2018 . She saw Dr. Lacinda Axon but she did not want to have repeat surgery therefore seen by Dr. Baruch Gouty and s/ p radiation.   She states her headaches have cleared completely. She has intermittent tremors but is seeing Dr. Melrose Nakayama for that. . No change in visual fields.   Schizophrenia:She is doing well now. States she has been able to get monthly injections of Haldol at [facility]. No longer hearing voices and feeling condemned. She is now at an apartment in [place]. She is no longer getting pill packs from psychiatric medication. . She had a  nurse coordinator Herma Mering [phone], but not sure if still the same. She takes multiple medications that causes dry mouth, advised biotin and sips of water throughout the day   Aorta atherosclerosis: found on CT chest done Nov 2018. She is on statin and aspirin daily. Unchanged   DMII: had labs done while at [facility], taking metformin and hgbA1C is at goal, today 6.2%  Eye exam is up to date.  She has polyphagia, polydipsia and polyuria. On statin therapy and aspirin   HTN: she is now on lisinopril, bp is at goal, no chest pain or palpitation  Morbid obesity: she is trying to eat healthier, more fruit and vegetables and has lost weight since last  visit.    Patient Active Problem List   Diagnosis Date Noted  . Hx of resection of meningioma 07/21/2018  . Tremor 07/21/2018  . Schizoaffective disorder ([place]) 12/29/2017  . Morbid obesity ([place]) 12/09/2017  . Meningioma ([place]) 11/25/2017  . Atherosclerosis of aorta ([place]) 11/25/2017  . Calcification of coronary artery 11/25/2017  . Schizophrenia ([place]) 11/04/2017  . Acid reflux 06/29/2015  . Diabetes mellitus type 2, controlled, without complications ([place]) 12/87/8676  . Cardiac murmur 06/29/2015  . Arthritis of knee, degenerative 06/28/2015  . Insomnia w/ sleep apnea 03/21/2015  . Anxiety disorder 03/21/2015  . Hypertension 03/21/2015  . HLD (hyperlipidemia) 03/21/2015  . Urge incontinence 03/21/2015    Past Surgical History:  Procedure Laterality Date  . BRAIN TUMOR EXCISION  2012   benign  . CARDIAC CATHETERIZATION  2008  . CESAREAN SECTION    . CYST REMOVAL HAND    . LUNG LOBECTOMY  1977   benign tumor  . TOTAL KNEE ARTHROPLASTY Left 01/28/2017   Procedure: TOTAL KNEE ARTHROPLASTY;  Surgeon: Corky Mull, MD;  Location: [facility] ORS;  Service: Orthopedics;  Laterality: Left;    Family History  Problem Relation Age of Onset  . Heart disease Brother   . Depression Mother   . Heart attack Mother   . Stroke Mother   .  Alcohol abuse Father   . Stroke Father   . Diabetes Sister   . Diabetes Sister   . Stomach cancer Sister   . Kidney disease Sister   . COPD Brother   . Lung cancer Brother   . Diabetes Brother     Social History   Socioeconomic History  . Marital status: Single    Spouse name: Not on file  . Number of children: Not on file  . Years of education: Not on file  . Highest education level: Not on file  Occupational History  . Not on file  Social Needs  . Financial resource strain: Not hard at all  . Food insecurity:    Worry: Never true    Inability: Never true  . Transportation needs:    Medical: No    Non-medical: No  Tobacco Use  . Smoking  status: Never Smoker  . Smokeless tobacco: Never Used  Substance and Sexual Activity  . Alcohol use: No    Alcohol/week: 0.0 standard drinks  . Drug use: No  . Sexual activity: Not Currently  Lifestyle  . Physical activity:    Days per week: 0 days    Minutes per session: 0 min  . Stress: Only a little  Relationships  . Social connections:    Talks on phone: Not on file    Gets together: Not on file    Attends religious service: More than 4 times per year    Active member of club or organization: No    Attends meetings of clubs or organizations: Never    Relationship status: Married  . Intimate partner violence:    Fear of current or ex partner: No    Emotionally abused: No    Physically abused: No    Forced sexual activity: No  Other Topics Concern  . Not on file  Social History Narrative  . Not on file     Current Outpatient Medications:  .  aspirin EC 81 MG tablet, Take 1 tablet (81 mg total) by mouth daily before breakfast., Disp: 30 tablet, Rfl: 5 .  atorvastatin (LIPITOR) 40 MG tablet, Take 1 tablet (40 mg total) by mouth daily at 6 PM., Disp: 30 tablet, Rfl: 5 .  benztropine (COGENTIN) 1 MG tablet, Take 1 tab in the morning and 2 tabs at night, Disp: , Rfl:  .  Elastic Bandages & Supports (KNEE COMPRESSION SLEEVE/L/XL) MISC, 2 each by Does not apply route as needed. Wear during day; take off at night, Disp: 2 each, Rfl: 0 .  escitalopram (LEXAPRO) 10 MG tablet, , Disp: , Rfl:  .  haloperidol decanoate (HALDOL DECANOATE) 100 MG/ML injection, Inject 1 mL (100 mg total) into the muscle every 28 (twenty-eight) days. Next injection due on 01/27/18, Disp: 1 mL, Rfl: 0 .  hydrochlorothiazide (HYDRODIURIL) 25 MG tablet, Take 1 tablet (25 mg total) by mouth daily., Disp: 30 tablet, Rfl: 5 .  ibuprofen (ADVIL,MOTRIN) 200 MG tablet, Take 600 mg by mouth every 8 (eight) hours as needed (for knee pain.)., Disp: , Rfl:  .  lisinopril (PRINIVIL,ZESTRIL) 20 MG tablet, Take 1 tablet (20  mg total) by mouth daily., Disp: 30 tablet, Rfl: 5 .  metFORMIN (GLUCOPHAGE) 500 MG tablet, Take 1 tablet (500 mg total) by mouth 2 (two) times daily with a meal., Disp: 30 tablet, Rfl: 5 .  omeprazole (PRILOSEC) 20 MG capsule, Take 1 capsule (20 mg total) by mouth daily., Disp: 30 capsule, Rfl: 5 .  traZODone (DESYREL)  50 MG tablet, Take 1 tablet (50 mg total) by mouth at bedtime., Disp: 30 tablet, Rfl: 0  Allergies  Allergen Reactions  . Percocet [Oxycodone-Acetaminophen] Diarrhea, Nausea And Vomiting and Nausea Only  . Tramadol Hcl Diarrhea, Nausea And Vomiting and Nausea Only  . Vicodin [Hydrocodone-Acetaminophen] Diarrhea, Nausea And Vomiting and Nausea Only    I personally reviewed active problem list, medication list, allergies, family history, social history with the patient/caregiver today.   ROS  Constitutional: Negative for fever , positive for  weight change - lost weight since last visit .  Respiratory: Negative for cough and shortness of breath.   Cardiovascular: Negative for chest pain or palpitations.  Gastrointestinal: Negative for abdominal pain, no bowel changes.  Musculoskeletal: Negative for gait problem or joint swelling.  Skin: Negative for rash.  Neurological: Negative for dizziness or headache. Positive for tremors, under the care of Dr. Melrose Nakayama  No other specific complaints in a complete review of systems (except as listed in HPI above).  Objective  Vitals:   08/03/18 0929  BP: 132/74  Pulse: 92  Resp: 16  Temp: (!) 97.4 F (36.3 C)  TempSrc: Oral  SpO2: 95%  Weight: 242 lb 14.4 oz (110.2 kg)  Height: 5' 5"  (1.651 m)    Body mass index is 40.42 kg/m.  Physical Exam  Constitutional: Patient appears well-developed and well-nourished. Obese No distress.  HEENT: head atraumatic, normocephalic, pupils equal and reactive to light,  neck supple, throat within normal limits Cardiovascular: Normal rate, regular rhythm and normal heart sounds.  No murmur  heard. No BLE edema. Pulmonary/Chest: Effort normal and breath sounds normal. No respiratory distress. Abdominal: Soft.  There is no tenderness. Neurological: fine tremors on right index finger Psychiatric: Patient has a normal mood and affect. behavior is normal. Judgment and thought content normal. She is doing well today   Recent Results (from the past 2160 hour(s))  Protime-INR     Status: None   Collection Time: 06/23/18 11:55 AM  Result Value Ref Range   Prothrombin Time 12.7 11.4 - 15.2 seconds   INR 0.96     Comment: Performed at [facility], [address] [place], [place] [address]  APTT     Status: None   Collection Time: 06/23/18 11:55 AM  Result Value Ref Range   aPTT 29 24 - 36 seconds    Comment: Performed at [facility], [address] [place], [place] [address]  CBC     Status: None   Collection Time: 06/23/18 11:55 AM  Result Value Ref Range   WBC 7.4 4.0 - 10.5 K/uL   RBC 3.93 3.87 - 5.11 MIL/uL   Hemoglobin 12.4 12.0 - 15.0 g/dL   HCT 36.0 36.0 - 46.0 %   MCV 91.6 80.0 - 100.0 fL   MCH 31.6 26.0 - 34.0 pg   MCHC 34.4 30.0 - 36.0 g/dL   RDW 13.9 11.5 - 15.5 %   Platelets 239 150 - 400 K/uL   nRBC 0.0 0.0 - 0.2 %    Comment: Performed at [facility], [address] [place], [place] [address]  Differential     Status: None   Collection Time: 06/23/18 11:55 AM  Result Value Ref Range   Neutrophils Relative % 69 %   Neutro Abs 5.1 1.7 - 7.7 K/uL   Lymphocytes Relative 23 %   Lymphs Abs 1.7 0.7 - 4.0 K/uL   Monocytes Relative 6 %   Monocytes Absolute 0.5 0.1 -  1.0 K/uL   Eosinophils Relative 1 %   Eosinophils Absolute 0.1 0.0 - 0.5 K/uL   Basophils Relative 1 %   Basophils Absolute 0.1 0.0 - 0.1 K/uL   Immature Granulocytes 0 %   Abs Immature Granulocytes 0.03 0.00 - 0.07 K/uL    Comment: Performed at [facility], [address] [place], [place] [address]  Comprehensive metabolic panel     Status: Abnormal    Collection Time: 06/23/18 11:55 AM  Result Value Ref Range   Sodium 140 135 - 145 mmol/L   Potassium 3.5 3.5 - 5.1 mmol/L   Chloride 104 98 - 111 mmol/L   CO2 26 22 - 32 mmol/L   Glucose, Bld 121 (H) 70 - 99 mg/dL   BUN 17 8 - 23 mg/dL   Creatinine, Ser 0.76 0.44 - 1.00 mg/dL   Calcium 9.2 8.9 - 10.3 mg/dL   Total Protein 7.1 6.5 - 8.1 g/dL   Albumin 4.0 3.5 - 5.0 g/dL   AST 26 15 - 41 U/L   ALT 17 0 - 44 U/L   Alkaline Phosphatase 62 38 - 126 U/L   Total Bilirubin 0.7 0.3 - 1.2 mg/dL   GFR calc non Af Amer >60 >60 mL/min   GFR calc Af Amer >60 >60 mL/min    Comment: (NOTE) The eGFR has been calculated using the CKD EPI equation. This calculation has not been validated in all clinical situations. eGFR's persistently <60 mL/min signify possible Chronic Kidney Disease.    Anion gap 10 5 - 15    Comment: Performed at [facility], [place], [place] [address]  Troponin I     Status: None   Collection Time: 06/23/18 11:55 AM  Result Value Ref Range   Troponin I <0.03 <0.03 ng/mL    Comment: Performed at [facility], [address] [place],  [address]  Glucose, capillary     Status: Abnormal   Collection Time: 06/23/18 12:03 PM  Result Value Ref Range   Glucose-Capillary 118 (H) 70 - 99 mg/dL     PHQ2/9: Depression screen [facility] 2/9 08/03/2018 06/23/2018 04/01/2018 01/28/2018 12/09/2017  Decreased Interest 2 0 2 0 0  Down, Depressed, Hopeless 0 0 0 0 0  PHQ - 2 Score 2 0 2 0 0  Altered sleeping 0 0 0 2 -  Tired, decreased energy 3 0 0 1 -  Change in appetite 3 0 3 0 -  Feeling bad or failure about yourself  0 0 2 0 -  Trouble concentrating 2 0 2 0 -  Moving slowly or fidgety/restless 2 0 0 0 -  Suicidal thoughts 0 0 0 0 -  PHQ-9 Score 12 0 9 3 -  Difficult doing work/chores Somewhat difficult Not difficult at all Somewhat difficult Not difficult at all -  Some recent data might be hidden     Fall Risk: Fall Risk  08/03/2018  06/23/2018 05/12/2018 05/07/2018 04/01/2018  Falls in the past year? 1 No No No Yes  Number falls in past yr: 0 - - - 1  Injury with Fall? 0 - - - No    Assessment & Plan  1. Diabetes mellitus type 2 in obese (HCC)  - POCT HgB A1C  2. Meningioma, recurrent of brain [facility])  Seeing Dr. Melrose Nakayama, had radiation by Dr. Baruch Gouty   3. Morbid obesity ([place])  Discussed with the patient the risk posed by an increased BMI. Discussed importance of portion control,  calorie counting and at least 150 minutes of physical activity weekly. Avoid sweet beverages and drink more water. Eat at least 6 servings of fruit and vegetables daily   4. Essential hypertension  bp is at goal   5. Schizophrenia, paranoid [facility])  Keep follow up with psychiatrist at [facility]   6. Mixed hyperlipidemia  Continue statins and healthy diet   7. Calcification of coronary artery   8. Atherosclerosis of aorta (HCC)  On statin and aspirin   9. Financial difficulties  - Ambulatory referral to Chronic Care Management Services

## 2018-08-11 ENCOUNTER — Ambulatory Visit: Payer: Self-pay

## 2018-08-11 DIAGNOSIS — I10 Essential (primary) hypertension: Secondary | ICD-10-CM

## 2018-08-11 DIAGNOSIS — E1169 Type 2 diabetes mellitus with other specified complication: Secondary | ICD-10-CM

## 2018-08-11 DIAGNOSIS — E669 Obesity, unspecified: Principal | ICD-10-CM

## 2018-08-11 NOTE — Chronic Care Management (AMB) (Signed)
  Chronic Care Management   Note  08/11/2018 Name: [name] MRN: [id number] DOB: Sep 24, 1950   Ms. Michaelene Dutan was scheduled for her initial face to face visit with the CCM (Chronic Care Management) Team today 08/11/18 at 10:00 for goal setting and education related to her chronic health conditions including but not limited to HTN, DM2, Meningioma, and Morbid obesity. Patient admits to forgetting appointment. She has been rescheduled for 08/18/18 at 1:30pm and request a reminder call on 08/17/18. CCM RN CM will call patient on 08/17/18 as reminder.    Sarenity Ramaker E. Rollene Rotunda, RN, BSN Nurse Care Coordinator [facility] / [facility] Care Management  [phone]

## 2018-08-18 ENCOUNTER — Ambulatory Visit (INDEPENDENT_AMBULATORY_CARE_PROVIDER_SITE_OTHER): Payer: Medicare Other | Admitting: Pharmacist

## 2018-08-18 DIAGNOSIS — Z599 Problem related to housing and economic circumstances, unspecified: Secondary | ICD-10-CM

## 2018-08-18 DIAGNOSIS — E1169 Type 2 diabetes mellitus with other specified complication: Secondary | ICD-10-CM | POA: Diagnosis not present

## 2018-08-18 DIAGNOSIS — E669 Obesity, unspecified: Secondary | ICD-10-CM

## 2018-08-18 DIAGNOSIS — Z598 Other problems related to housing and economic circumstances: Secondary | ICD-10-CM

## 2018-08-18 DIAGNOSIS — I10 Essential (primary) hypertension: Secondary | ICD-10-CM

## 2018-08-18 DIAGNOSIS — G4733 Obstructive sleep apnea (adult) (pediatric): Secondary | ICD-10-CM | POA: Diagnosis not present

## 2018-08-18 NOTE — Chronic Care Management (AMB) (Signed)
Chronic Care Management   Initial Visit Note  08/18/2018 Name: Rosie Torrez MRN: [id number] DOB: October 18, 1950  Referred by: Steele Sizer, MD Reason for referral : Chronic Care Management (initial visit chronic case managment)   Subjective: "I need to get these tremors under control"  Objective:  BP Readings from Last 3 Encounters:  08/03/18 132/74  06/23/18 (!) 185/102  06/23/18 138/86   Lab Results  Component Value Date   HGBA1C 6.3 08/03/2018    Assessment: Denaya Horn is a 67 y.o. year old female patient of Dr. Steele Sizer, MD. Dr. Ancil Boozer asked the CCM team to consult today for assistance with chronic disease management related to community resources and education surrounding medical conditions. Ms.. Sicard agreed that CCM services would be helpful to her. She was seen in the office today by the CCM Team to establish healthcare goals.   Goals Addressed    . "I forgot that I had [employer] OTC benefits" (pt-stated)       While discussing chronic DM and HTN conditions with Ms. Kaelin, it was discovered that she did not have a BP cuff. Ms. Drozdowski was unaware that she had [employer] over the counter benefits that she could use quarterly. She is on a low income budget and will greatly benefit from utilization of the OTC benefit program.  Clinical Goals: Over the next 30 days, patient will verbalize utilization of [employer] OTC benifits  Interventions: Discussed [employer] OTC benefits with patient, patient given [employer] OTC catalogue. CCM Clinic pharmacist reviewed catalogue and highlighted specific medications/DME that patient could benefit from.    [name] "I want to get these tremors under control" (pt-stated)       Ms. Fate expresses a significant decrease in her quality of life related to her hand tremors and lack of fine motor skills. She admits to spilling food on herself and not being able to utilize button clothing or tied shoes. She brings in her pill pack today for  review and there is a note written on the back to request OT. Patient states this note was written from one of her community providers. This CCM RN CM agrees that OT would benefit patient and provide and improvement in her ADLs  Clinical Goals: Over the next 30 days, patient will verbalize engagement with occupational therapy  Interventions: Discussed referral to OT with PCP, discussed utilizing slip-on shoes and pull over clothing without buttons.    [name] "I want to keep my BP and DM under control" (pt-stated)       Patient is doing well managing her DM and HTN. Today her main focus is her tremors. She is taking medications as prescribed. Last A1C and BP documented above. Patient will consider utilizing OTC benefits for BP monitor. Support and education will continue to be provided by the CCM Team.  Clinical Goals: Over the next 30 days, patient will verbalize continued stabilization of health status related to DM and HTN  Interventions: Discussed HTN and DM conditions, Review EMR        Plan: CCM RN CM will follow up with patient in 1 month   Ms. Dobkins was given information about Chronic Care Management services today including:  1. CCM service includes personalized support from designated clinical staff supervised by her physician, including individualized plan of care and coordination with other care providers 2. 24/7 contact phone numbers for assistance for urgent and routine care needs. 3. Service will only be billed when office clinical staff spend 20 minutes or  more in a month to coordinate care. 4. Only one practitioner may furnish and bill the service in a calendar month. 5. The patient may stop CCM services at any time (effective at the end of the month) by phone call to the office staff. 6. The patient will be responsible for cost sharing (co-pay) of up to 20% of the service fee (after annual deductible is met).  Patient agreed to services and verbal consent obtained.      Dawson Hollman E. Rollene Rotunda, RN, BSN Nurse Care Coordinator [facility] / [facility] Care Management  [phone]

## 2018-08-18 NOTE — Chronic Care Management (AMB) (Signed)
Chronic Care Management   Note  08/18/2018 Name: Lauren [name] MRN: [id number] DOB: 20-May-1951  Subjective:   Does the patient  feel that his/her medications are working for him/her?  yes  Has the patient been experiencing any side effects to the medications prescribed?  yes  Does the patient measure his/her own blood glucose at home?  no   Does the patient measure his/her own blood pressure at home? no   Does the patient have any problems obtaining medications due to transportation or finances?   no  Understanding of regimen: fair Understanding of indications: fair Potential of compliance: excellent  Objective:  Medications Reviewed Today    Reviewed by Cathi Roan, [facility] (Pharmacist) on 08/18/18 at 1425  Med List Status: <None>  Medication Order Taking? Sig Documenting Provider Last Dose Status Informant  aspirin EC 81 MG tablet [id number] No Take 1 tablet (81 mg total) by mouth daily before breakfast.  Patient not taking:  Reported on 08/18/2018   Steele Sizer, MD Not Taking Active   atorvastatin (LIPITOR) 40 MG tablet [id number] Yes Take 1 tablet (40 mg total) by mouth daily at 6 PM. Steele Sizer, MD Taking Active   benztropine (COGENTIN) 1 MG tablet [id number] Yes Take 1 tab in the morning and 2 tabs at night [provider] Taking Active            Med Note Inda Coke   Mon Aug 03, 2018  9:34 AM) [employer]-Dr. Janyth Contes Bandages & Supports (KNEE COMPRESSION SLEEVE/L/XL) MISC [id number] Yes 2 each by Does not apply route as needed. Wear during day; take off at night Poulose, Bethel Born, NP Taking Active   escitalopram (LEXAPRO) 10 MG tablet [id number] Yes  [provider] Taking Active   haloperidol decanoate (HALDOL DECANOATE) 100 MG/ML injection [id number] Yes Inject 1 mL (100 mg total) into the muscle every 28 (twenty-eight) days. Next injection due on 01/27/18 Marylin Crosby, MD Taking Active   hydrochlorothiazide (HYDRODIURIL)  25 MG tablet [id number] Yes Take 1 tablet (25 mg total) by mouth daily. Steele Sizer, MD Taking Active   ibuprofen (ADVIL,MOTRIN) 200 MG tablet [id number] Yes Take 600 mg by mouth every 8 (eight) hours as needed (for knee pain.). [provider] Taking Active Pharmacy Records  lisinopril (PRINIVIL,ZESTRIL) 20 MG tablet [id number] Yes Take 1 tablet (20 mg total) by mouth daily. Steele Sizer, MD Taking Active   metFORMIN (GLUCOPHAGE) 500 MG tablet [id number] Yes Take 1 tablet (500 mg total) by mouth 2 (two) times daily with a meal. Steele Sizer, MD Taking Active   omeprazole (PRILOSEC) 20 MG capsule [id number] Yes Take 1 capsule (20 mg total) by mouth daily. Steele Sizer, MD Taking Active   traZODone (DESYREL) 50 MG tablet [id number] Yes Take 1 tablet (50 mg total) by mouth at bedtime. McNew, Tyson Babinski, MD Taking Active           Assessment:   Total Number of meds:   10 (polypharmacy > 10 meds)  Indications for all medications: _0  Yes       _1  No  Adherence Review  _2  Excellent (no doses missed/week)     _3  Good (no more than 1 dose missed/week)     _4  Partial (2-3 doses missed/week)     _5  Poor (>3 doses missed/week)  Intervention  YES NO  Explanation   Unnecessary drug therapy      Duplicate Therapy <GGYIRSWNIOEVOJJK>_0<\/XFGHWEXHBZJIRCVE>_9  _7     No indication _8  _9   Treating avoidable ADE _0  _1    Needs additional therapy      Medication requires monitoring _2  _3    Preventative therapy   _4  _5    Possible untreated condition   _6  _7    Synergistic therapy   _8  _9     Safety/Adverse Med Event      Contraindication present _10  _11     Unsafe medication _12  _13     Allergic reaction _14  _15     Drug interaction _16  _17     Excessive dose/duration _18  _19     Undesirable side effect _20  _21     Adherence      Cannot afford medication _22  _23     Cannot self-administer medication appropriately _24  _25     Does not understand directions _26  _27     Prefers not to take _28  _29     Product unavailable _30  _31     Forgets to take  _32  _33     Dose/Frequency      Dose form inappropriate for patient _34  _35     Dosing consideration _36  _37     Different drug needed      Condition refractory to medication _38  _39     More effective medication available _40  _41  Per chart review, patient has previously taken Seroquel and Zyprexa for schizophrenia. Zyprexa is more effective/less risk to patient but was DCed due to cost. There is a MAP available for Zyprexa however because patient is doing well on Haldol injections I am hesitant to recommend a change at this time   More cost-effective medication available _42  _43     Other pertinent pharmacist  counseling   Provided [employer] OTC benefit catalog and select appropriate items to order, including muscle rub, Biotene mouth rinse, vitamins, APAP, shower transfer chair    Medication Assistance Findings:  Extra Help:   _44  Already receiving Full Extra Help  _45  Already receiving Partial Extra Help  _46  Eligible based on reported income and assets  _47  Not Eligible based on reported income and assets Need to investigate eligibility  Patient Assistance Programs: 1) Haldol decanoate made by Wynetta Emery and [employer] o Income requirement met: _48  Yes _49  No _50  Unknown o Out-of-pocket prescription expenditure met:    _51  Yes _52  No  _53  Unknown  <IOEVOJJKKXFGHWEX>_9<\/BZJIRCVELFYBOFBP>_10  Not applicable - Will apply    Additional medication assistance options reviewed with patient as warranted:  Insurance OTC catalogue   Goals Addressed            This Visit's Progress   . "I forgot that I had [employer]HC OTC benefits" (pt-stated)        Clinical Goals: Over the next 30 days, patient will verbalize utilization of UHC OTC benifits  Interventions: Discussed UHC OTC benefits with patient, patient given UHC OTC catalogue. [facility] pharmacist reviewed catalogue and highlighted specific medications/DME that patient could benefit from.    [name] "I want to get these tremors under control" (pt-stated)        Clinical Goals: Over the next 30 days, patient will  verbalize engagement with occupational therapy  Interventions: Discussed referral to OT with PCP, discussed utilizing slip-on shoes and pull over clothing without buttons.    [name] "I want to keep my BP and DM under control" (pt-stated)        Clinical Goals: Over the next 30 days, patient will verbalize continued stabilization of health status related to DM and HTN  Interventions: Discussed HTN and DM conditions, Review EMR    . My injection is expensive (pt-stated)       Clinical Goal(s):  Over the next 10 days, Ms.Oswaldo Milian will provide the necessary supplementary documents (proof of out of pocket prescription expenditure, proof of household income) needed for medication assistance applications to CCM pharmacist.   Interventions:  CCM pharmacist will apply for medication assistance program for Haldol made by Wynetta Emery and Wynetta Emery         Time:  Time spent counseling patient: 60 Additional time spent on charting: 40  Plan: Will apply for assistance for Haldol injection   Follow up within 2 weeks   Ruben Reason, PharmD Clinical Pharmacist [place] [phone]

## 2018-08-18 NOTE — Patient Instructions (Addendum)
1. Thank You for allowing the CCM (Chronic Care Management) Team to assist you with your healthcare goals!! It was so nice to meet you today. 2. Please consider ordering using your [facility] Over the counter benefit (knee brace, depends, vitamins, BP cuff, arthritis rub) 3. Continue to take all medications as prescribed. You are doing a great job!! 4. Tylenol is a better choice for pain. Please consider switching from Ibuprofen to Tylenol. 5. You can use heat or cold to help with your arthritic pain. 6. Stop listerine mouth wash and consider using Biotene (I have provided coupons to you) 7. Contact the CCM Team if you have any question or need to reschedule your initial visit.     Goals Addressed    . "I forgot that I had [employer]HC OTC benefits" (pt-stated)        Clinical Goals: Over the next 30 days, patient will verbalize utilization of UHC OTC benifits  Interventions: Discussed UHC OTC benefits with patient, patient given [employer] OTC catalogue. [facility] pharmacist reviewed catalogue and highlighted specific medications/DME that patient could benefit from.    [name] "I want to get these tremors under control" (pt-stated)        Clinical Goals: Over the next 30 days, patient will verbalize engagement with occupational therapy  Interventions: Discussed referral to OT with PCP, discussed utilizing slip-on shoes and pull over clothing without buttons.    [name] "I want to keep my BP and DM under control" (pt-stated)        Clinical Goals: Over the next 30 days, patient will verbalize continued stabilization of health status related to DM and HTN  Interventions: Discussed HTN and DM conditions, Review EMR     Managing Your Hypertension Hypertension is commonly called high blood pressure. This is when the force of your blood pressing against the walls of your arteries is too strong. Arteries are blood vessels that carry blood from your heart throughout your body. Hypertension forces the heart to  work harder to pump blood, and may cause the arteries to become narrow or stiff. Having untreated or uncontrolled hypertension can cause heart attack, stroke, kidney disease, and other problems. What are blood pressure readings? A blood pressure reading consists of a higher number over a lower number. Ideally, your blood pressure should be below 120/80. The first ("top") number is called the systolic pressure. It is a measure of the pressure in your arteries as your heart beats. The second ("bottom") number is called the diastolic pressure. It is a measure of the pressure in your arteries as the heart relaxes. What does my blood pressure reading mean? Blood pressure is classified into four stages. Based on your blood pressure reading, your health care provider may use the following stages to determine what type of treatment you need, if any. Systolic pressure and diastolic pressure are measured in a unit called mm Hg. Normal  Systolic pressure: below 865.  Diastolic pressure: below 80. Elevated  Systolic pressure: 784-696.  Diastolic pressure: below 80. Hypertension stage 1  Systolic pressure: 295-284.  Diastolic pressure: 13-24. Hypertension stage 2  Systolic pressure: 401 or above.  Diastolic pressure: 90 or above. What health risks are associated with hypertension? Managing your hypertension is an important responsibility. Uncontrolled hypertension can lead to:  A heart attack.  A stroke.  A weakened blood vessel (aneurysm).  Heart failure.  Kidney damage.  Eye damage.  Metabolic syndrome.  Memory and concentration problems.  What changes can I make to  manage my hypertension? Hypertension can be managed by making lifestyle changes and possibly by taking medicines. Your health care provider will help you make a plan to bring your blood pressure within a normal range. Eating and drinking  Eat a diet that is high in fiber and potassium, and low in salt (sodium), added  sugar, and fat. An example eating plan is called the DASH (Dietary Approaches to Stop Hypertension) diet. To eat this way: ? Eat plenty of fresh fruits and vegetables. Try to fill half of your plate at each meal with fruits and vegetables. ? Eat whole grains, such as whole wheat pasta, brown rice, or whole grain bread. Fill about one quarter of your plate with whole grains. ? Eat low-fat diary products. ? Avoid fatty cuts of meat, processed or cured meats, and poultry with skin. Fill about one quarter of your plate with lean proteins such as fish, chicken without skin, beans, eggs, and tofu. ? Avoid premade and processed foods. These tend to be higher in sodium, added sugar, and fat.  Reduce your daily sodium intake. Most people with hypertension should eat less than 1,500 mg of sodium a day.  Limit alcohol intake to no more than 1 drink a day for nonpregnant women and 2 drinks a day for men. One drink equals 12 oz of beer, 5 oz of wine, or 1 oz of hard liquor. Lifestyle  Work with your health care provider to maintain a healthy body weight, or to lose weight. Ask what an ideal weight is for you.  Get at least 30 minutes of exercise that causes your heart to beat faster (aerobic exercise) most days of the week. Activities may include walking, swimming, or biking.  Include exercise to strengthen your muscles (resistance exercise), such as weight lifting, as part of your weekly exercise routine. Try to do these types of exercises for 30 minutes at least 3 days a week.  Do not use any products that contain nicotine or tobacco, such as cigarettes and e-cigarettes. If you need help quitting, ask your health care provider.  Control any long-term (chronic) conditions you have, such as high cholesterol or diabetes. Monitoring  Monitor your blood pressure at home as told by your health care provider. Your personal target blood pressure may vary depending on your medical conditions, your age, and other  factors.  Have your blood pressure checked regularly, as often as told by your health care provider. Working with your health care provider  Review all the medicines you take with your health care provider because there may be side effects or interactions.  Talk with your health care provider about your diet, exercise habits, and other lifestyle factors that may be contributing to hypertension.  Visit your health care provider regularly. Your health care provider can help you create and adjust your plan for managing hypertension. Will I need medicine to control my blood pressure? Your health care provider may prescribe medicine if lifestyle changes are not enough to get your blood pressure under control, and if:  Your systolic blood pressure is 130 or higher.  Your diastolic blood pressure is 80 or higher.  Take medicines only as told by your health care provider. Follow the directions carefully. Blood pressure medicines must be taken as prescribed. The medicine does not work as well when you skip doses. Skipping doses also puts you at risk for problems. Contact a health care provider if:  You think you are having a reaction to medicines you have taken.  You have repeated (recurrent) headaches.  You feel dizzy.  You have swelling in your ankles.  You have trouble with your vision. Get help right away if:  You develop a severe headache or confusion.  You have unusual weakness or numbness, or you feel faint.  You have severe pain in your chest or abdomen.  You vomit repeatedly.  You have trouble breathing. Summary  Hypertension is when the force of blood pumping through your arteries is too strong. If this condition is not controlled, it may put you at risk for serious complications.  Your personal target blood pressure may vary depending on your medical conditions, your age, and other factors. For most people, a normal blood pressure is less than 120/80.  Hypertension is  managed by lifestyle changes, medicines, or both. Lifestyle changes include weight loss, eating a healthy, low-sodium diet, exercising more, and limiting alcohol. This information is not intended to replace advice given to you by your health care provider. Make sure you discuss any questions you have with your health care provider. Document Released: 05/20/2012 Document Revised: 07/24/2016 Document Reviewed: 07/24/2016 Elsevier Interactive Patient Education  2018 [employer].     Diabetes Mellitus and Nutrition When you have diabetes (diabetes mellitus), it is very important to have healthy eating habits because your blood sugar (glucose) levels are greatly affected by what you eat and drink. Eating healthy foods in the appropriate amounts, at about the same times every day, can help you:  Control your blood glucose.  Lower your risk of heart disease.  Improve your blood pressure.  Reach or maintain a healthy weight.  Every person with diabetes is different, and each person has different needs for a meal plan. Your health care provider may recommend that you work with a diet and nutrition specialist (dietitian) to make a meal plan that is best for you. Your meal plan may vary depending on factors such as:  The calories you need.  The medicines you take.  Your weight.  Your blood glucose, blood pressure, and cholesterol levels.  Your activity level.  Other health conditions you have, such as heart or kidney disease.  How do carbohydrates affect me? Carbohydrates affect your blood glucose level more than any other type of food. Eating carbohydrates naturally increases the amount of glucose in your blood. Carbohydrate counting is a method for keeping track of how many carbohydrates you eat. Counting carbohydrates is important to keep your blood glucose at a healthy level, especially if you use insulin or take certain oral diabetes medicines. It is important to know how many  carbohydrates you can safely have in each meal. This is different for every person. Your dietitian can help you calculate how many carbohydrates you should have at each meal and for snack. Foods that contain carbohydrates include:  Bread, cereal, rice, pasta, and crackers.  Potatoes and corn.  Peas, beans, and lentils.  Milk and yogurt.  Fruit and juice.  Desserts, such as cakes, cookies, ice cream, and candy.  How does alcohol affect me? Alcohol can cause a sudden decrease in blood glucose (hypoglycemia), especially if you use insulin or take certain oral diabetes medicines. Hypoglycemia can be a life-threatening condition. Symptoms of hypoglycemia (sleepiness, dizziness, and confusion) are similar to symptoms of having too much alcohol. If your health care provider says that alcohol is safe for you, follow these guidelines:  Limit alcohol intake to no more than 1 drink per day for nonpregnant women and 2 drinks per day for  men. One drink equals 12 oz of beer, 5 oz of wine, or 1 oz of hard liquor.  Do not drink on an empty stomach.  Keep yourself hydrated with water, diet soda, or unsweetened iced tea.  Keep in mind that regular soda, juice, and other mixers may contain a lot of sugar and must be counted as carbohydrates.  What are tips for following this plan? Reading food labels  Start by checking the serving size on the label. The amount of calories, carbohydrates, fats, and other nutrients listed on the label are based on one serving of the food. Many foods contain more than one serving per package.  Check the total grams (g) of carbohydrates in one serving. You can calculate the number of servings of carbohydrates in one serving by dividing the total carbohydrates by 15. For example, if a food has 30 g of total carbohydrates, it would be equal to 2 servings of carbohydrates.  Check the number of grams (g) of saturated and trans fats in one serving. Choose foods that have low  or no amount of these fats.  Check the number of milligrams (mg) of sodium in one serving. Most people should limit total sodium intake to less than 2,300 mg per day.  Always check the nutrition information of foods labeled as "low-fat" or "nonfat". These foods may be higher in added sugar or refined carbohydrates and should be avoided.  Talk to your dietitian to identify your daily goals for nutrients listed on the label. Shopping  Avoid buying canned, premade, or processed foods. These foods tend to be high in fat, sodium, and added sugar.  Shop around the outside edge of the grocery store. This includes fresh fruits and vegetables, bulk grains, fresh meats, and fresh dairy. Cooking  Use low-heat cooking methods, such as baking, instead of high-heat cooking methods like deep frying.  Cook using healthy oils, such as olive, canola, or sunflower oil.  Avoid cooking with butter, cream, or high-fat meats. Meal planning  Eat meals and snacks regularly, preferably at the same times every day. Avoid going long periods of time without eating.  Eat foods high in fiber, such as fresh fruits, vegetables, beans, and whole grains. Talk to your dietitian about how many servings of carbohydrates you can eat at each meal.  Eat 4-6 ounces of lean protein each day, such as lean meat, chicken, fish, eggs, or tofu. 1 ounce is equal to 1 ounce of meat, chicken, or fish, 1 egg, or 1/4 cup of tofu.  Eat some foods each day that contain healthy fats, such as avocado, nuts, seeds, and fish. Lifestyle   Check your blood glucose regularly.  Exercise at least 30 minutes 5 or more days each week, or as told by your health care provider.  Take medicines as told by your health care provider.  Do not use any products that contain nicotine or tobacco, such as cigarettes and e-cigarettes. If you need help quitting, ask your health care provider.  Work with a Social worker or diabetes educator to identify  strategies to manage stress and any emotional and social challenges. What are some questions to ask my health care provider?  Do I need to meet with a diabetes educator?  Do I need to meet with a dietitian?  What number can I call if I have questions?  When are the best times to check my blood glucose? Where to find more information:  American Diabetes Association: diabetes.org/food-and-fitness/food  Academy of Nutrition and Dietetics:  PokerClues.dk  [employer] of Diabetes and Digestive and Kidney Diseases (NIH): ContactWire.be Summary  A healthy meal plan will help you control your blood glucose and maintain a healthy lifestyle.  Working with a diet and nutrition specialist (dietitian) can help you make a meal plan that is best for you.  Keep in mind that carbohydrates and alcohol have immediate effects on your blood glucose levels. It is important to count carbohydrates and to use alcohol carefully. This information is not intended to replace advice given to you by your health care provider. Make sure you discuss any questions you have with your health care provider. Document Released: 05/23/2005 Document Revised: 09/30/2016 Document Reviewed: 09/30/2016 Elsevier Interactive Patient Education  2018 [place] (Chronic Care Management) Team   Trish Fountain RN, BSN Nurse Care Coordinator  [phone]  Ruben Reason PharmD  Clinical Pharmacist  [phone]

## 2018-09-08 ENCOUNTER — Ambulatory Visit: Payer: Self-pay | Admitting: *Deleted

## 2018-09-08 NOTE — Telephone Encounter (Signed)
She needs an appointment with our office of neurology if she has a neurologist.

## 2018-09-08 NOTE — Telephone Encounter (Signed)
Pt reports seizure activity last night at 2100, unwitnessed. States has had several seizures over the years, last one 2-3 years ago. States was lying on sofa when both hands, arms, legs went numb, face numb on both sides and felt face "Twisting on both sides.' States these are same symptoms as previous seizures. Denies any incontinence, no LOC. States lasted 5-10 minutes, "Not sure." denies any symptoms presently. States did not call EMS because "I didn't feel it was an emergency." States "I won't go to ED now because I can't afford it." Pt lives alone. [place] called practice and spoke with Suanne Marker.  Will alert MD covering for Dr. Ancil Boozer. Instructed pt to call EMS if occurs again. Pts # [phone] Reason for Disposition . [1] Seizure lasting < 5 minutes AND [2] history of prior seizure(s) AND [3] taking anticonvulsants    LAst seizure 2-3 years ago  Answer Assessment - Initial Assessment Questions 1. ONSET: "How long did the seizure last?" (Minutes)      5- 10 minutes, unsure 2. CONTENT: "Describe what happened during the seizure. Did the body become stiff? Was there any jerking?"      Numbness "All over" , face twisting, both sides.  3. CIRCUMSTANCE: "What was the individual doing when the seizure began?"      Lying on the sofa, asleep. Woke up hands numb, then legs, then face and arms 4. MENTAL STATUS: "Does he know who he is, who you are, and where he is?"     yes 5. PRIOR SEIZURES: "Has the individual had a seizure (convulsion) before?" If so, ask: "When was the last time?" and "What happened last time?"     2-3 years ago 6. EPILEPSY: "Does the individual have epilepsy?" (note: check for medical ID bracelet)     no 7. MEDICATIONS: "Does the individual take anticonvulsant medications?" (e.g., yes/no, compliance, any recent changes)     On cogentin for "Tremors" 8. INJURY: "Did the individual hurt himself during the seizure?" (e.g., head, tongue)     Rolled to floor from sofa, no injuries 9.  OTHER SYMPTOMS: "Are there any other symptoms?" (e.g., fever, headache)     no  Protocols used: [place]

## 2018-09-10 ENCOUNTER — Encounter: Payer: Self-pay | Admitting: Family Medicine

## 2018-09-10 ENCOUNTER — Encounter: Payer: Self-pay | Admitting: Emergency Medicine

## 2018-09-10 ENCOUNTER — Other Ambulatory Visit: Payer: Self-pay

## 2018-09-10 ENCOUNTER — Ambulatory Visit (INDEPENDENT_AMBULATORY_CARE_PROVIDER_SITE_OTHER): Payer: Medicare Other | Admitting: Family Medicine

## 2018-09-10 ENCOUNTER — Emergency Department: Payer: Medicare Other

## 2018-09-10 ENCOUNTER — Emergency Department
Admission: EM | Admit: 2018-09-10 | Discharge: 2018-09-10 | Disposition: A | Discharge status: Home / Self Care | Payer: Medicare Other | Attending: Emergency Medicine | Admitting: Emergency Medicine

## 2018-09-10 VITALS — BP 170/100 | HR 88 | Temp 98.2°F | Resp 16 | Ht 65.0 in | Wt 244.1 lb

## 2018-09-10 DIAGNOSIS — I10 Essential (primary) hypertension: Secondary | ICD-10-CM | POA: Insufficient documentation

## 2018-09-10 DIAGNOSIS — R569 Unspecified convulsions: Secondary | ICD-10-CM | POA: Insufficient documentation

## 2018-09-10 DIAGNOSIS — R531 Weakness: Secondary | ICD-10-CM

## 2018-09-10 DIAGNOSIS — E119 Type 2 diabetes mellitus without complications: Secondary | ICD-10-CM | POA: Diagnosis not present

## 2018-09-10 DIAGNOSIS — Z9889 Other specified postprocedural states: Secondary | ICD-10-CM

## 2018-09-10 DIAGNOSIS — D329 Benign neoplasm of meninges, unspecified: Secondary | ICD-10-CM | POA: Diagnosis not present

## 2018-09-10 DIAGNOSIS — R200 Anesthesia of skin: Secondary | ICD-10-CM | POA: Insufficient documentation

## 2018-09-10 DIAGNOSIS — Z79899 Other long term (current) drug therapy: Secondary | ICD-10-CM | POA: Insufficient documentation

## 2018-09-10 DIAGNOSIS — Z7982 Long term (current) use of aspirin: Secondary | ICD-10-CM | POA: Insufficient documentation

## 2018-09-10 DIAGNOSIS — Z7984 Long term (current) use of oral hypoglycemic drugs: Secondary | ICD-10-CM | POA: Diagnosis not present

## 2018-09-10 DIAGNOSIS — R202 Paresthesia of skin: Secondary | ICD-10-CM

## 2018-09-10 DIAGNOSIS — Z8603 Personal history of neoplasm of uncertain behavior: Secondary | ICD-10-CM

## 2018-09-10 DIAGNOSIS — Z96652 Presence of left artificial knee joint: Secondary | ICD-10-CM | POA: Insufficient documentation

## 2018-09-10 LAB — DIFFERENTIAL
Abs Immature Granulocytes: 0.01 10*3/uL (ref 0.00–0.07)
Basophils Absolute: 0.1 10*3/uL (ref 0.0–0.1)
Basophils Relative: 1 %
Eosinophils Absolute: 0.1 10*3/uL (ref 0.0–0.5)
Eosinophils Relative: 1 %
Immature Granulocytes: 0 %
Lymphocytes Relative: 35 %
Lymphs Abs: 2.3 10*3/uL (ref 0.7–4.0)
Monocytes Absolute: 0.5 10*3/uL (ref 0.1–1.0)
Monocytes Relative: 8 %
Neutro Abs: 3.7 10*3/uL (ref 1.7–7.7)
Neutrophils Relative %: 55 %

## 2018-09-10 LAB — COMPREHENSIVE METABOLIC PANEL
ALT: 18 U/L (ref 0–44)
AST: 23 U/L (ref 15–41)
Albumin: 4.1 g/dL (ref 3.5–5.0)
Alkaline Phosphatase: 82 U/L (ref 38–126)
Anion gap: 9 (ref 5–15)
BUN: 15 mg/dL (ref 8–23)
CO2: 25 mmol/L (ref 22–32)
Calcium: 9.2 mg/dL (ref 8.9–10.3)
Chloride: 106 mmol/L (ref 98–111)
Creatinine, Ser: 0.76 mg/dL (ref 0.44–1.00)
GFR calc Af Amer: >60 mL/min (ref >60)
GFR calc non Af Amer: >60 mL/min (ref >60)
Glucose, Bld: 93 mg/dL (ref 70–99)
Potassium: 3.5 mmol/L (ref 3.5–5.1)
Sodium: 140 mmol/L (ref 135–145)
Total Bilirubin: 0.7 mg/dL (ref 0.3–1.2)
Total Protein: 7.3 g/dL (ref 6.5–8.1)

## 2018-09-10 LAB — CBC
HCT: 37 % (ref 36.0–46.0)
Hemoglobin: 12.4 g/dL (ref 12.0–15.0)
MCH: 31.5 pg (ref 26.0–34.0)
MCHC: 33.5 g/dL (ref 30.0–36.0)
MCV: 93.9 fL (ref 80.0–100.0)
Platelets: 273 10*3/uL (ref 150–400)
RBC: 3.94 MIL/uL (ref 3.87–5.11)
RDW: 14.1 % (ref 11.5–15.5)
WBC: 6.6 10*3/uL (ref 4.0–10.5)
nRBC: 0 % (ref 0.0–0.2)

## 2018-09-10 MED ORDER — CLONIDINE HCL 0.1 MG PO TABS
0.2000 mg | ORAL_TABLET | Freq: Once | ORAL | Status: AC
  Administered 2018-09-10: 0.2 mg via ORAL
  Filled 2018-09-10: qty 2

## 2018-09-10 NOTE — Telephone Encounter (Signed)
Pt scheduled appt with you for today

## 2018-09-10 NOTE — Discharge Instructions (Addendum)
Follow-up with Dr. Melrose Nakayama next month as scheduled.  Do not drive until you follow-up.  Continue to take all of your medications as prescribed.  Return to the ER for new, worsening, or recurrent shaking, numbness, weakness, feeling like you are going to pass out, or any other new or worsening symptoms that concern you.

## 2018-09-10 NOTE — Progress Notes (Signed)
Name: Lauren [name]   MRN: [id number]    DOB: 12-17-1950   Date:09/10/2018       Progress Note  Subjective  Chief Complaint  Chief Complaint  Patient presents with  . Seizures    Had a seizure on Monday night.    HPI  PT presents with concern for possible seizure - she notes that she had an episode 3 days ago that lasted about 20 minutes.  Her symptoms at the time were numbness on the face, BUE and BLE, weakness, and some twitching of the BUE - R>L.  She slid from the cough to the floor when this occurred, and it took her about 70minutes to get up off the floor.  She denies having had shortness of breath or chest pain. She called her son to let him know, but she was able to get up so he didn't come.  She has had a similar episode in 2013 after having meningioma operation.    - Saw Dr. Melrose Nakayama for initial consult 07/15/2018, and she is supposed to follow up on 10/15/18.  She is currently taking cogentin 2mg  at night for tremor.  She is also on Neurontin 100mg  BID.   - We discussed that this could possibly have been a TIA vs Seizure vs worsening of her meningioma. We will obtain imaging and labs today.  She had MRI Brain wo contrast 06/23/2018 that was negative for infarction, but +moderate chronic small-vessel ischemic changes.  Patient Active Problem List   Diagnosis Date Noted  . Hx of resection of meningioma 07/21/2018  . Tremor 07/21/2018  . Schizoaffective disorder ([place]) 12/29/2017  . Morbid obesity ([place]) 12/09/2017  . Meningioma ([place]) 11/25/2017  . Atherosclerosis of aorta ([place]) 11/25/2017  . Calcification of coronary artery 11/25/2017  . Schizophrenia ([place]) 11/04/2017  . Acid reflux 06/29/2015  . Diabetes mellitus type 2, controlled, without complications ([place]) 50/27/7412  . Cardiac murmur 06/29/2015  . Arthritis of knee, degenerative 06/28/2015  . Insomnia w/ sleep apnea 03/21/2015  . Anxiety disorder 03/21/2015  . Hypertension 03/21/2015  . HLD (hyperlipidemia)  03/21/2015  . Urge incontinence 03/21/2015    Social History   Tobacco Use  . Smoking status: Never Smoker  . Smokeless tobacco: Never Used  Substance Use Topics  . Alcohol use: No    Alcohol/week: 0.0 standard drinks     Current Outpatient Medications:  .  Ascorbic Acid (VITAMIN C) 500 MG CHEW, Chew 500 mg by mouth daily., Disp: , Rfl:  .  aspirin EC 81 MG tablet, Take 1 tablet (81 mg total) by mouth daily before breakfast., Disp: 30 tablet, Rfl: 5 .  benztropine (COGENTIN) 1 MG tablet, Take 1 tab in the morning and 2 tabs at night, Disp: , Rfl:  .  escitalopram (LEXAPRO) 10 MG tablet, , Disp: , Rfl:  .  gabapentin (NEURONTIN) 100 MG capsule, Take 100 mg by mouth 2 (two) times daily., Disp: , Rfl:  .  haloperidol decanoate (HALDOL DECANOATE) 100 MG/ML injection, Inject 1 mL (100 mg total) into the muscle every 28 (twenty-eight) days. Next injection due on 01/27/18, Disp: 1 mL, Rfl: 0 .  hydrochlorothiazide (HYDRODIURIL) 25 MG tablet, Take 1 tablet (25 mg total) by mouth daily., Disp: 30 tablet, Rfl: 5 .  ibuprofen (ADVIL,MOTRIN) 200 MG tablet, Take 600 mg by mouth every 8 (eight) hours as needed (for knee pain.)., Disp: , Rfl:  .  lisinopril (PRINIVIL,ZESTRIL) 20 MG tablet, Take 1 tablet (20 mg total) by mouth daily.,  Disp: 30 tablet, Rfl: 5 .  metFORMIN (GLUCOPHAGE) 500 MG tablet, Take 1 tablet (500 mg total) by mouth 2 (two) times daily with a meal., Disp: 30 tablet, Rfl: 5 .  omeprazole (PRILOSEC) 20 MG capsule, Take 1 capsule (20 mg total) by mouth daily., Disp: 30 capsule, Rfl: 5 .  traZODone (DESYREL) 50 MG tablet, Take 1 tablet (50 mg total) by mouth at bedtime., Disp: 30 tablet, Rfl: 0 .  atorvastatin (LIPITOR) 40 MG tablet, Take 1 tablet (40 mg total) by mouth daily at 6 PM., Disp: 30 tablet, Rfl: 5 .  Elastic Bandages & Supports (KNEE COMPRESSION SLEEVE/L/XL) MISC, 2 each by Does not apply route as needed. Wear during day; take off at night, Disp: 2 each, Rfl: 0  Allergies    Allergen Reactions  . Percocet [Oxycodone-Acetaminophen] Diarrhea, Nausea And Vomiting and Nausea Only  . Tramadol Hcl Diarrhea, Nausea And Vomiting and Nausea Only  . Vicodin [Hydrocodone-Acetaminophen] Diarrhea, Nausea And Vomiting and Nausea Only    I personally reviewed active problem list, medication list, notes from last encounter, lab results with the patient/caregiver today.  ROS  Ten systems reviewed and is negative except as mentioned in HPI.  Objective  Vitals:   09/10/18 1238  BP: (!) 190/110  Pulse: 88  Resp: 16  Temp: 98.2 F (36.8 C)  TempSrc: Oral  SpO2: 98%  Weight: 244 lb 1.6 oz (110.7 kg)  Height: 5\' 5"  (1.651 m)   Body mass index is 40.62 kg/m.  Nursing Note and Vital Signs reviewed.  Physical Exam  Constitutional: Patient appears well-developed and well-nourished. No distress.  HENT: Head: Normocephalic and atraumatic. Ears: bilateral TMs with no erythema or effusion; Nose: Nose normal. Mouth/Throat: Oropharynx is clear and moist. No oropharyngeal exudate or tonsillar swelling.  Eyes: Conjunctivae and EOM are normal. No scleral icterus.  Pupils are equal, round, and reactive to light.  Neck: Normal range of motion. Neck supple. No JVD present. No thyromegaly present.  Cardiovascular: Normal rate, regular rhythm and normal heart sounds.  No murmur heard. No BLE edema. Pulmonary/Chest: Effort normal and breath sounds normal. No respiratory distress. Musculoskeletal: Normal range of motion, no joint effusions. No gross deformities Neurological: Pt is alert and oriented to person, place, and time. RIGHT hand grip is slightly weaker than the left. Rapid alternating movement is uncoordinated in bilateral hands. Action tremor present bilaterally. Balance and gait are normal.  Speech is slightly dysarthric - per staff who has cared for her before speech is at baseline. Face is symmetric. Skin: Skin is warm and dry. No rash noted. No erythema.  Psychiatric:  Patient has a normal mood and affect. behavior is normal. Judgment and thought content normal.  No results found for this or any previous visit (from the past 72 hour(s)).  Assessment & Plan  1. Numbness and tingling 2. Weakness 3. Meningioma ([place]) 4. Hx of resection of meningioma 5. Essential hypertension - Uncontrolled BP today, right grip weakness, and abnormal coordination along with reported sypmtoms on Monday as above - I recommend ER for further evaluation for stroke vs seizure activity vs meningioma worsening.  She is agreeable to go but adamantly refuses EMS. I do not feel that she should drive due to current symptoms, though she does appear reasonably stable for POV transport.  Car is called for her to be transported POV.

## 2018-09-10 NOTE — ED Provider Notes (Signed)
[facility] Emergency Department Provider Note ____________________________________________   First MD Initiated Contact with Patient 09/10/18 [phone]     (approximate)  I have reviewed the triage vital signs and the nursing notes.   HISTORY  Chief Complaint Transient Ischemic Attack    HPI Lauren [name] is a 68 y.o. female with PMH as noted below who presents with concern for a possible seizure-like episode 3 days ago, acute onset, lasting around 20 minutes, and then resolving.  The patient states that she was awake throughout the episode.  She states that she had numbness down both sides of her face and body, and then into her arms and legs on both sides.  She reports that she had twitching of her muscles.  She states that she fell to the ground, however after about 20 minutes was able to pull herself back up onto the couch.  The patient denies any symptoms since then except for soreness in her muscles.  She denies any ongoing weakness or numbness.  The patient has a history of a meningioma which was resected in 2013.  She had seizures at that time and states that this episode was somewhat similar to the seizures that she used to have.  The meningioma has been monitored since then.  The patient saw a primary care provider today and was referred to the ED for imaging.   Past Medical History:  Diagnosis Date  . Anxiety   . Apnea, sleep 02/02/2014  . Awareness of heartbeats 02/02/2014  . Breathlessness on exertion 02/02/2014  . Diabetes mellitus   . Encounter for pre-employment examination 06/29/2015  . Excessive sweating 07/05/2015  . Fibromyalgia   . GERD (gastroesophageal reflux disease)   . Gravida 1 10/26/2015   1.    . Heart murmur   . Herniated disc   . Hyperlipidemia   . Hypertension   . Itch of skin 10/26/2015  . Lack of bladder control   . Lung tumor   . Rheumatoid arthritis ([place])   . Schizoaffective disorder ([place])   . Screening for  cervical cancer 07/29/2017  . Seizure [facility])    after brain surgery 2012. last seizure 2013!  [name] Sex counseling 10/26/2015  . Sleep apnea   . 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 Stiffness of both knees 06/22/2015    Patient Active Problem List   Diagnosis Date Noted  . Hx of resection of meningioma 07/21/2018  . Tremor 07/21/2018  . Schizoaffective disorder ([place]) 12/29/2017  . Morbid obesity ([place]) 12/09/2017  . Meningioma ([place]) 11/25/2017  . Atherosclerosis of aorta ([place]) 11/25/2017  . Calcification of coronary artery 11/25/2017  . Schizophrenia ([place]) 11/04/2017  . Acid reflux 06/29/2015  . Diabetes mellitus type 2, controlled, without complications ([place]) 73/53/2992  . Cardiac murmur 06/29/2015  . Arthritis of knee, degenerative 06/28/2015  . Insomnia w/ sleep apnea 03/21/2015  . Anxiety disorder 03/21/2015  . Hypertension 03/21/2015  . HLD (hyperlipidemia) 03/21/2015  . Urge incontinence 03/21/2015    Past Surgical History:  Procedure Laterality Date  . BRAIN TUMOR EXCISION  2012   benign  . CARDIAC CATHETERIZATION  2008  . CESAREAN SECTION    . CYST REMOVAL HAND    . LUNG LOBECTOMY  1977   benign tumor  . TOTAL KNEE ARTHROPLASTY Left 01/28/2017   Procedure: TOTAL KNEE ARTHROPLASTY;  Surgeon: Corky Mull, MD;  Location: [facility] ORS;  Service: Orthopedics;  Laterality: Left;    Prior to  Admission medications   Medication Sig Start Date End Date Taking? Authorizing Provider  Ascorbic Acid (VITAMIN C) 500 MG CHEW Chew 500 mg by mouth daily.    [provider]  aspirin EC 81 MG tablet Take 1 tablet (81 mg total) by mouth daily before breakfast. 04/01/18   Ancil Boozer, Drue Stager, MD  atorvastatin (LIPITOR) 40 MG tablet Take 1 tablet (40 mg total) by mouth daily at 6 PM. 04/01/18   Ancil Boozer, Drue Stager, MD  benztropine (COGENTIN) 1 MG tablet Take 1 tab in the morning and 2 tabs at night 07/15/18   [provider]  Elastic Bandages & Supports  (KNEE COMPRESSION SLEEVE/L/XL) MISC 2 each by Does not apply route as needed. Wear during day; take off at night 05/12/18   Poulose, Bethel Born, NP  escitalopram (LEXAPRO) 10 MG tablet  07/28/18   [provider]  gabapentin (NEURONTIN) 100 MG capsule Take 100 mg by mouth 2 (two) times daily.    [provider]  haloperidol decanoate (HALDOL DECANOATE) 100 MG/ML injection Inject 1 mL (100 mg total) into the muscle every 28 (twenty-eight) days. Next injection due on 01/27/18 01/27/18   Marylin Crosby, MD  hydrochlorothiazide (HYDRODIURIL) 25 MG tablet Take 1 tablet (25 mg total) by mouth daily. 04/01/18   Steele Sizer, MD  ibuprofen (ADVIL,MOTRIN) 200 MG tablet Take 600 mg by mouth every 8 (eight) hours as needed (for knee pain.).    [provider]  lisinopril (PRINIVIL,ZESTRIL) 20 MG tablet Take 1 tablet (20 mg total) by mouth daily. 04/01/18   Steele Sizer, MD  metFORMIN (GLUCOPHAGE) 500 MG tablet Take 1 tablet (500 mg total) by mouth 2 (two) times daily with a meal. 04/01/18   Ancil Boozer, Drue Stager, MD  omeprazole (PRILOSEC) 20 MG capsule Take 1 capsule (20 mg total) by mouth daily. 04/01/18   Steele Sizer, MD  traZODone (DESYREL) 50 MG tablet Take 1 tablet (50 mg total) by mouth at bedtime. 01/12/18   McNew, Tyson Babinski, MD    Allergies Percocet [oxycodone-acetaminophen]; Tramadol hcl; and Vicodin [hydrocodone-acetaminophen]  Family History  Problem Relation Age of Onset  . Heart disease Brother   . Depression Mother   . Heart attack Mother   . Stroke Mother   . Alcohol abuse Father   . Stroke Father   . Diabetes Sister   . Diabetes Sister   . Stomach cancer Sister   . Kidney disease Sister   . COPD Brother   . Lung cancer Brother   . Diabetes Brother     Social History Social History   Tobacco Use  . Smoking status: Never Smoker  . Smokeless tobacco: Never Used  Substance Use Topics  . Alcohol use: No    Alcohol/week: 0.0 standard drinks  . Drug use: No      Review of Systems  Constitutional: No fever. Eyes: No visual changes. ENT: No neck pain. Cardiovascular: Denies chest pain. Respiratory: Denies shortness of breath. Gastrointestinal: No vomiting. Genitourinary: Negative for flank pain.  Musculoskeletal: Negative for back pain. Skin: Negative for rash. Neurological: Negative for headache.   ____________________________________________   PHYSICAL EXAM:  VITAL SIGNS: ED Triage Vitals  Enc Vitals Group     BP 09/10/18 1345 (!) 138/97     Pulse Rate 09/10/18 1341 63     Resp 09/10/18 1341 16     Temp 09/10/18 1341 97.7 F (36.5 C)     Temp Source 09/10/18 1341 Oral     SpO2 09/10/18 1341 96 %  Weight 09/10/18 1342 244 lb 0.8 oz (110.7 kg)     Height 09/10/18 1342 5\' 5"  (1.651 m)     Head Circumference --      Peak Flow --      Pain Score 09/10/18 1341 8     Pain Loc --      Pain Edu? --      Excl. in [place]? --     Constitutional: Alert and oriented. Well appearing and in no acute distress. Eyes: Conjunctivae are normal.  EOMI.  PERRLA. Head: Atraumatic. Nose: No congestion/rhinnorhea. Mouth/Throat: Mucous membranes are moist.   Neck: Normal range of motion.  Cardiovascular:  Good peripheral circulation. Respiratory: Normal respiratory effort.   Gastrointestinal: No distention.  Musculoskeletal: No lower extremity edema.  Extremities warm and well perfused.  Neurologic: Dysarthric speech, baseline for patient.  5/5 motor strength and sensation intact to bilateral upper and lower extremities with no significant difference in grip strength on my exam.  Cranial nerves III through XII intact.  Mildly ataxic to bilateral upper extremities. Skin:  Skin is warm and dry. No rash noted. Psychiatric: Mood and affect are normal. Speech and behavior are normal.  ____________________________________________   LABS (all labs ordered are listed, but only abnormal results are displayed)  Labs Reviewed  CBC  DIFFERENTIAL   COMPREHENSIVE METABOLIC PANEL   ____________________________________________  EKG  ED ECG REPORT I, Arta Silence, the attending physician, personally viewed and interpreted this ECG.  Date: 09/10/2018 EKG Time: 1358 Rate: 69 Rhythm: normal sinus rhythm QRS Axis: normal Intervals: normal ST/T Wave abnormalities: Nonspecific T wave abnormalities Narrative Interpretation: no evidence of acute ischemia  ____________________________________________  RADIOLOGY  CT head: Superior left frontal lobe 1.7 cm mass consistent with meningioma, stable from prior imaging  ____________________________________________   PROCEDURES  Procedure(s) performed: No  Procedures  Critical Care performed: No ____________________________________________   INITIAL IMPRESSION / ASSESSMENT AND PLAN / ED COURSE  Pertinent labs & imaging results that were available during my care of the patient were reviewed by me and considered in my medical decision making (see chart for details).  68 year old female with PMH as noted above presents with an episode 3 days ago which she describes as a possible mini stroke although the description sounds more like a seizure.  However, the patient was awake throughout.  She reports numbness and twitching movements in bilateral upper and lower extremities.  She reports similar seizure-like episodes previously with her meningioma before it was resected.  I reviewed the past medical records in [place].  The patient follows with Dr. Melrose Nakayama from neurology.  She was last imaged in October of last year, with CT and MRI showing a stable meningioma.  She was seen at the outpatient primary care clinic today and referred to the emergency department for imaging and further work-up.  On exam today, the patient has somewhat dysarthric speech which is apparently baseline for her.  I was not able to perceive any significant difference in grip strength or other motor symptoms as were  described in the NP note from earlier today.  The patient does have some mild ataxia with bilateral upper extremities.  Lab work-up today is unremarkable.  CT shows a meningioma which is stable from the prior imaging in October.  I will consult neurology to determine further recommendations.  ----------------------------------------- 5:51 PM on 09/10/2018 -----------------------------------------  I discussed the patient's case with Gayland Curry, the PA from Dr. Lannie Fields office.  She also reviewed the patient's medical records and imaging.  She advises that the episode that happened this week is very atypical for a seizure since the patient was awake.  She recommends that at this time we should not start an antiepileptic.  However, she advises that the patient not drive until she is reevaluated.  The patient has an appointment with Dr. Melrose Nakayama arranged for next month.  On reassessment, the patient remained stable although her blood pressure is now significantly elevated.  She states that she did take her blood pressure medication today.  She is not having any acute symptoms of hypertensive crisis, however given that the systolic is over 013 I will give a dose of clonidine here and reassess.  ----------------------------------------- 7:28 PM on 09/10/2018 -----------------------------------------  Blood pressure is now 150s over 90s.  The patient continues to be asymptomatic.  She is stable for discharge home at this time.  Return precautions given and she expresses understanding.  I instructed the patient not to drive until she follows up. ____________________________________________   FINAL CLINICAL IMPRESSION(S) / ED DIAGNOSES  Final diagnoses:  Seizure-like activity ([place])      NEW MEDICATIONS STARTED DURING THIS VISIT:  New Prescriptions   No medications on file     Note:  This document was prepared using Dragon voice recognition software and may include unintentional dictation  errors.    Arta Silence, MD 09/10/18 1929

## 2018-09-10 NOTE — ED Triage Notes (Signed)
Says she did fall during th episode, but was awake throughout.  Says she had numbess on both sides and twitching of the muscles.  Was sent to see if her meningioma is worseingin

## 2018-09-10 NOTE — Patient Instructions (Signed)
Please go directly to [facility] ER for further evaluation of possible stroke vs worsening of your meningioma.

## 2018-09-10 NOTE — ED Notes (Signed)
Pt ambulatory to restroom on her own

## 2018-09-10 NOTE — ED Triage Notes (Signed)
Says she thinks she had a ministroke on Monday.  Says she had numbness down both sides of face Monday night and it lasted just a few minutes.  Denies any problem now.  Went to pcp today and they told her to come over here.

## 2018-09-17 ENCOUNTER — Telehealth: Payer: Self-pay

## 2018-09-17 NOTE — Chronic Care Management (AMB) (Signed)
  Chronic Care Management Note  Lauren Fawley Watlingtonis a 68 y.o.year old female patient of Dr. Steele Sizer, MD. Dr. Lenox Ahr the CCM team to consult today for assistance with chronic disease management related to community resources and education surrounding medical conditions.Ms..Watlingtonagreed that CCM services would be helpful to her. She was seen in the office 08/18/18 by the CCM Team to establish healthcare goals. Today I reached out to [name] via phone for a 30 day follow up.   Was unable to reach patient via telephone today for 30 day follow up to goals and have left HIPAA compliant voicemail asking patient to return my call. (unsuccessful outreach #1).  Plan: Will follow-up within 3-5  business days via telephone.     Carnell Casamento E. Rollene Rotunda, RN, BSN Nurse Care Coordinator [facility] Care Management  [phone]

## 2018-09-22 ENCOUNTER — Ambulatory Visit: Payer: Self-pay | Admitting: Pharmacist

## 2018-09-22 ENCOUNTER — Telehealth: Payer: Self-pay

## 2018-09-22 DIAGNOSIS — Z598 Other problems related to housing and economic circumstances: Secondary | ICD-10-CM

## 2018-09-22 DIAGNOSIS — F200 Paranoid schizophrenia: Secondary | ICD-10-CM

## 2018-09-22 DIAGNOSIS — Z599 Problem related to housing and economic circumstances, unspecified: Secondary | ICD-10-CM

## 2018-09-22 NOTE — Telephone Encounter (Signed)
Copied from [place] [phone]. Topic: General - Inquiry >> Sep 22, 2018 11:16 AM Virl Axe D wrote: Reason for CRM:  Sharyn Lull with [facility] called stating because pt has had multiple falls and and a decline in health, she would like to suggest Dr. Ancil Boozer provide an order for a home health agency to have someone come in the pt's home to assist. Please advise. CB# [phone]

## 2018-09-22 NOTE — Chronic Care Management (AMB) (Signed)
  Chronic Care Management   Follow Up Note   09/22/2018 Name: Lauren [name] MRN: [id number] DOB: Sep 27, 1950  Referred by: Steele Sizer, MD [name] for referral : Chronic Care Management (Follow up)  Subjective: "I went to the hospital for a stroke. I have had a couple of falls"   Assessment: Lauren [name] is a 68 year old female patient of Dr. Ancil Boozer engaged with the Chronic Care management team for assistance with medication management, diabetes, and HTN. Initial CCM office visit 08/18/18. Of note, patient seen in the ED 09/10/2018 for possible seizure activity. Patient was ultimately diagnosed with TIA. No new medications prescribed.   #Medication assistance: Patient states that she doesn't need assistance with Haldol as it is covered by [employer].   Goals Addressed            This Visit's Progress   . COMPLETED: My medicine is expensive (pt-stated)       Clinical Goal(s): Over the next 10 days, Lauren [name] will provide the necessary supplementary documents (proof of out of pocket prescription expenditure, proof of household income) needed for medication assistance applications to CCM pharmacist.   Interventions:  09/22/17: Patient states that Lauren [name] is covering her injection costs and she doesn't need to apply for assistance. She does have to pay for her maintenance medications at [facility] and owes about $140, but she likes having her medications bubble packaged (instead of using mail order).   See Past Updates for further interventions        Plan: Follow up with patient in 30 days, or sooner if Dr. Ancil Boozer requests CCM appointment with patient for home health or home caregiver options.    Lauren [name], PharmD Clinical Pharmacist [facility] [phone]

## 2018-09-22 NOTE — Telephone Encounter (Signed)
Home health needs face to face encounter, she will need to be seen for referral to be placed

## 2018-09-22 NOTE — Patient Instructions (Signed)
Goals Addressed            This Visit's Progress   . COMPLETED: My medicine is expensive (pt-stated)       Clinical Goal(s): Over the next 10 days, [name] will provide the necessary supplementary documents (proof of out of pocket prescription expenditure, proof of household income) needed for medication assistance applications to CCM pharmacist.   Interventions:  09/22/17: Patient states that [name] is covering her injection costs and she doesn't need to apply for assistance. She does have to pay for her maintenance medications at [facility] and owes about $140, but she likes having her medications bubble packaged (instead of using mail order).   See Past Updates for further interventions       Please call a member of the CCM (Chronic Care Management) Team with any questions or case management needs:   Vanetta Mulders, BSN Nurse Care Coordinator  [phone]  Ruben Reason, PharmD  Clinical Pharmacist  [phone]  The patient verbalized understanding of instructions provided today and declined a print copy of patient instruction materials.

## 2018-09-24 ENCOUNTER — Telehealth: Payer: Self-pay

## 2018-09-24 ENCOUNTER — Ambulatory Visit: Payer: Self-pay

## 2018-09-24 DIAGNOSIS — F200 Paranoid schizophrenia: Secondary | ICD-10-CM

## 2018-09-24 DIAGNOSIS — Z599 Problem related to housing and economic circumstances, unspecified: Secondary | ICD-10-CM

## 2018-09-24 DIAGNOSIS — Z598 Other problems related to housing and economic circumstances: Secondary | ICD-10-CM

## 2018-09-24 DIAGNOSIS — E669 Obesity, unspecified: Secondary | ICD-10-CM

## 2018-09-24 DIAGNOSIS — E1169 Type 2 diabetes mellitus with other specified complication: Secondary | ICD-10-CM

## 2018-09-24 DIAGNOSIS — I10 Essential (primary) hypertension: Secondary | ICD-10-CM

## 2018-09-24 NOTE — Telephone Encounter (Signed)
I have called Lauren [name] twice to inform her patient needs a face to face encounter. I have not heard back from her. The lady at [facility] states that she has the message and will call back.

## 2018-09-24 NOTE — Chronic Care Management (AMB) (Signed)
     Chronic Care Management Note  Lauren Custer Watlingtonis a 68 y.o.year old femalepatient of Dr. Steele Sizer, MD.Dr. [facility] the CCM team to consult today for assistance with chronic disease management related to community resources and education surrounding medical conditions.[name] agreed that CCM services would be helpful toher. She was seen in the office 08/18/18 by the CCM Team to establish healthcare goals.Today I reached out to Ms. Langdon via phone for a follow up post ED visit for possible seizure-like activity.   Was unable to reach patient via telephone today. I have left HIPAA compliant voicemail asking patient to return my call. (unsuccessful outreach # ).  Plan: Will follow-up within 3-5  business days via telephone.     Rocklin Soderquist E. Rollene Rotunda, RN, BSN Nurse Care Coordinator [facility] Care Management  [phone]

## 2018-09-25 NOTE — Telephone Encounter (Signed)
LVM for pt to call the office to schedule an appt. °

## 2018-09-28 ENCOUNTER — Ambulatory Visit (INDEPENDENT_AMBULATORY_CARE_PROVIDER_SITE_OTHER): Payer: Medicare Other | Admitting: Family Medicine

## 2018-09-28 ENCOUNTER — Encounter: Payer: Self-pay | Admitting: Family Medicine

## 2018-09-28 VITALS — BP 134/82 | HR 94 | Temp 98.1°F | Resp 16 | Ht 65.0 in | Wt 243.0 lb

## 2018-09-28 DIAGNOSIS — R569 Unspecified convulsions: Secondary | ICD-10-CM

## 2018-09-28 DIAGNOSIS — R251 Tremor, unspecified: Secondary | ICD-10-CM

## 2018-09-28 DIAGNOSIS — B370 Candidal stomatitis: Secondary | ICD-10-CM

## 2018-09-28 DIAGNOSIS — R682 Dry mouth, unspecified: Secondary | ICD-10-CM

## 2018-09-28 MED ORDER — BIOTENE DRY MOUTH GENTLE DT PSTE: 1.0000 "application " | PASTE | Freq: Two times a day (BID) | DENTAL | 5 refills | Status: DC

## 2018-09-28 MED ORDER — BIOTENE DRY MOUTH MOISTURIZING MT SOLN: 1.0000 "application " | Freq: Two times a day (BID) | OROMUCOSAL | 5 refills | Status: DC

## 2018-09-28 MED ORDER — CLOTRIMAZOLE 10 MG MT TROC: 10.0000 mg | Freq: Every day | OROMUCOSAL | 0 refills | Status: DC

## 2018-09-28 NOTE — Progress Notes (Addendum)
Name: Lauren [name]   MRN: [id number]    DOB: 1950-09-19   Date:09/28/2018       Progress Note  Subjective  Chief Complaint  Chief Complaint  Patient presents with  . Follow-up    ER, trembling in hands  . Thrush    tongue white    HPI  Pt presents to follow up from ER visit on 09/10/2018 - she was sent by myself for concern for possible CVA vs seizure activity.  Labs performed - CBC w/ diff and CMP - both WNL, EKG WNL, CT head found stable meningioma - results as below. ED Physician did consult Gayland Curry PA-C from Dr. Kendrick Fries who recommended starting an antiepileptic, and that she may not drive until she is re-evaluated by Dr. Melrose Nakayama.  Since discharge she has scheduled follow up with her neurologist, Dr. Melrose Nakayama, on 10/12/2018. She is currently taking neurontin 100mg  BID, and no other antiepileptic.  She has not had any episodes since then, but does still have BUE ataxia; no falls, headache, confusion, limb weakness, or verbal changes. Does endorse some decreased concentration. She notes the limb ataxia is causing dexterity difficulty - we will refer to OT/home health for evaluation.  CT Head: IMPRESSION: - Apparent recurrent or residual meningioma anterior superior left frontal lobe, stable. No appreciable mass effect from this mass. No other mass. There is encephalomalacia in this area with prior postoperative change, stable. - Periventricular small vessel disease, stable. No acute infarct evident. No hemorrhage or extra-axial fluid collection. - There are foci of arterial vascular calcification. There is mucosal thickening in several ethmoid air cells. Electronically Signed   By: Lowella Grip III M.D.   On: 09/10/2018 14:39  Dry Mouth: White hair tongue for about 2-3 months.  She was told it could be related to dry mouth  Patient Active Problem List   Diagnosis Date Noted  . Hx of resection of meningioma 07/21/2018  . Tremor 07/21/2018  . Schizoaffective  disorder ([place]) 12/29/2017  . Morbid obesity ([place]) 12/09/2017  . Meningioma ([place]) 11/25/2017  . Atherosclerosis of aorta ([place]) 11/25/2017  . Calcification of coronary artery 11/25/2017  . Schizophrenia ([place]) 11/04/2017  . Acid reflux 06/29/2015  . Diabetes mellitus type 2, controlled, without complications ([place]) 84/66/5993  . Cardiac murmur 06/29/2015  . Arthritis of knee, degenerative 06/28/2015  . Insomnia w/ sleep apnea 03/21/2015  . Anxiety disorder 03/21/2015  . Hypertension 03/21/2015  . HLD (hyperlipidemia) 03/21/2015  . Urge incontinence 03/21/2015    Past Surgical History:  Procedure Laterality Date  . BRAIN TUMOR EXCISION  2012   benign  . CARDIAC CATHETERIZATION  2008  . CESAREAN SECTION    . CYST REMOVAL HAND    . LUNG LOBECTOMY  1977   benign tumor  . TOTAL KNEE ARTHROPLASTY Left 01/28/2017   Procedure: TOTAL KNEE ARTHROPLASTY;  Surgeon: Corky Mull, MD;  Location: [facility] ORS;  Service: Orthopedics;  Laterality: Left;    Family History  Problem Relation Age of Onset  . Heart disease Brother   . Depression Mother   . Heart attack Mother   . Stroke Mother   . Alcohol abuse Father   . Stroke Father   . Diabetes Sister   . Diabetes Sister   . Stomach cancer Sister   . Kidney disease Sister   . COPD Brother   . Lung cancer Brother   . Diabetes Brother     Social History   Socioeconomic History  . Marital  status: Single    Spouse name: Not on file  . Number of children: Not on file  . Years of education: Not on file  . Highest education level: Not on file  Occupational History  . Not on file  Social Needs  . Financial resource strain: Not hard at all  . Food insecurity:    Worry: Never true    Inability: Never true  . Transportation needs:    Medical: No    Non-medical: No  Tobacco Use  . Smoking status: Never Smoker  . Smokeless tobacco: Never Used  Substance and Sexual Activity  . Alcohol use: No    Alcohol/week: 0.0 standard drinks  .  Drug use: No  . Sexual activity: Not Currently  Lifestyle  . Physical activity:    Days per week: 0 days    Minutes per session: 0 min  . Stress: Only a little  Relationships  . Social connections:    Talks on phone: Not on file    Gets together: Not on file    Attends religious service: More than 4 times per year    Active member of club or organization: No    Attends meetings of clubs or organizations: Never    Relationship status: Married  . Intimate partner violence:    Fear of current or ex partner: No    Emotionally abused: No    Physically abused: No    Forced sexual activity: No  Other Topics Concern  . Not on file  Social History Narrative  . Not on file     Current Outpatient Medications:  .  aspirin EC 81 MG tablet, Take 1 tablet (81 mg total) by mouth daily before breakfast., Disp: 30 tablet, Rfl: 5 .  atorvastatin (LIPITOR) 40 MG tablet, Take 1 tablet (40 mg total) by mouth daily at 6 PM., Disp: 30 tablet, Rfl: 5 .  benztropine (COGENTIN) 1 MG tablet, Take 1 tab in the morning and 2 tabs at night, Disp: , Rfl:  .  Elastic Bandages & Supports (KNEE COMPRESSION SLEEVE/L/XL) MISC, 2 each by Does not apply route as needed. Wear during day; take off at night, Disp: 2 each, Rfl: 0 .  escitalopram (LEXAPRO) 10 MG tablet, , Disp: , Rfl:  .  gabapentin (NEURONTIN) 100 MG capsule, Take 100 mg by mouth 2 (two) times daily., Disp: , Rfl:  .  haloperidol decanoate (HALDOL DECANOATE) 100 MG/ML injection, Inject 1 mL (100 mg total) into the muscle every 28 (twenty-eight) days. Next injection due on 01/27/18, Disp: 1 mL, Rfl: 0 .  hydrochlorothiazide (HYDRODIURIL) 25 MG tablet, Take 1 tablet (25 mg total) by mouth daily., Disp: 30 tablet, Rfl: 5 .  ibuprofen (ADVIL,MOTRIN) 200 MG tablet, Take 600 mg by mouth every 8 (eight) hours as needed (for knee pain.)., Disp: , Rfl:  .  lisinopril (PRINIVIL,ZESTRIL) 20 MG tablet, Take 1 tablet (20 mg total) by mouth daily., Disp: 30 tablet, Rfl:  5 .  metFORMIN (GLUCOPHAGE) 500 MG tablet, Take 1 tablet (500 mg total) by mouth 2 (two) times daily with a meal., Disp: 30 tablet, Rfl: 5 .  omeprazole (PRILOSEC) 20 MG capsule, Take 1 capsule (20 mg total) by mouth daily., Disp: 30 capsule, Rfl: 5 .  traZODone (DESYREL) 50 MG tablet, Take 1 tablet (50 mg total) by mouth at bedtime., Disp: 30 tablet, Rfl: 0 .  Ascorbic Acid (VITAMIN C) 500 MG CHEW, Chew 500 mg by mouth daily., Disp: , Rfl:   Allergies  Allergen Reactions  . Percocet [Oxycodone-Acetaminophen] Diarrhea, Nausea And Vomiting and Nausea Only  . Tramadol Hcl Diarrhea, Nausea And Vomiting and Nausea Only  . Vicodin [Hydrocodone-Acetaminophen] Diarrhea, Nausea And Vomiting and Nausea Only    I personally reviewed active problem list, medication list, notes from last encounter, lab results with the patient/caregiver today.   ROS Ten systems reviewed and is negative except as mentioned in HPI.  Objective  Vitals:   09/28/18 1244  BP: 134/82  Pulse: 94  Resp: 16  Temp: 98.1 F (36.7 C)  TempSrc: Oral  SpO2: 97%  Weight: 243 lb (110.2 kg)  Height: 5\' 5"  (1.651 m)   Body mass index is 40.44 kg/m.  Physical Exam  Constitutional: Patient appears well-developed and well-nourished. No distress.  HENT: Head: Normocephalic and atraumatic. Ears: bilateral TMs with no erythema or effusion; Nose: Nose normal. Mouth/Throat: Oropharynx is clear and moist. No oropharyngeal exudate or tonsillar swelling.  Tongue has thick white coating in the center with elongated papillae on the posterior tongue  Eyes: Conjunctivae and EOM are normal. No scleral icterus.  Pupils are equal, round, and reactive to light.  Neck: Normal range of motion. Neck supple. No JVD present.  Cardiovascular: Normal rate, regular rhythm and normal heart sounds.  No murmur heard. No BLE edema. Pulmonary/Chest: Effort normal and breath sounds normal. No respiratory distress. Abdominal: Soft. Bowel sounds are  normal, no distension. There is no tenderness. No masses. Musculoskeletal: Normal range of motion, no joint effusions. No gross deformities Neurological: Pt is alert and oriented to person, place, and time. No cranial nerve deficit. Coordination, balance, speech and gait are baseline. LEFT hand grip is still slightly weaker, and there is BUE ataxia R>L, fine tremor at rest in the LEFT hand. Skin: Skin is warm and dry. No rash noted. No erythema.  Psychiatric: Patient has a normal mood and affect. behavior is normal. Judgment and thought content normal.  No results found for this or any previous visit (from the past 72 hour(s)).  PHQ2/9: Depression screen [facility] 2/9 09/28/2018 08/03/2018 06/23/2018 04/01/2018 01/28/2018  Decreased Interest 0 2 0 2 0  Down, Depressed, Hopeless 0 0 0 0 0  PHQ - 2 Score 0 2 0 2 0  Altered sleeping 0 0 0 0 2  Tired, decreased energy 0 3 0 0 1  Change in appetite 0 3 0 3 0  Feeling bad or failure about yourself  0 0 0 2 0  Trouble concentrating 0 2 0 2 0  Moving slowly or fidgety/restless 0 2 0 0 0  Suicidal thoughts 0 0 0 0 0  PHQ-9 Score 0 12 0 9 3  Difficult doing work/chores Not difficult at all Somewhat difficult Not difficult at all Somewhat difficult Not difficult at all  Some recent data might be hidden   Fall Risk: Fall Risk  09/28/2018 09/10/2018 08/03/2018 06/23/2018 05/12/2018  Falls in the past year? 1 1 1  No No  Number falls in past yr: 1 1 0 - -  Injury with Fall? 0 0 0 - -  Follow up Falls evaluation completed - - - -   Assessment & Plan  1. Seizure-like activity ([place]) - Follow up with Dr. Melrose Nakayama.  She is doing well, not driving (son brought her to appt today), no recent episodes.  2. Dry mouth - Artificial Saliva (BIOTENE DRY MOUTH MOISTURIZING) SOLN; Use as directed 1 application in the mouth or throat 2 (two) times daily.  Dispense: 44.3 mL; Refill: 5 -  Dentifrices (BIOTENE DRY MOUTH GENTLE) PSTE; Place 1 application onto teeth 2 (two) times  daily.  Dispense: 121.9 g; Refill: 5  3. Oral thrush - Suspect discoloration and abnormal presentation of tongue is secondary to her psychiatric medications causing dry mouth.  However, due to her irritation, we will treat for thrush.  Advised to return after completing mycelex if not improving or worsening. - Artificial Saliva (BIOTENE DRY MOUTH MOISTURIZING) SOLN; Use as directed 1 application in the mouth or throat 2 (two) times daily.  Dispense: 44.3 mL; Refill: 5 - Dentifrices (BIOTENE DRY MOUTH GENTLE) PSTE; Place 1 application onto teeth 2 (two) times daily.  Dispense: 121.9 g; Refill: 5 - clotrimazole (MYCELEX) 10 MG troche; Take 1 tablet (10 mg total) by mouth 5 (five) times daily.  Dispense: 70 tablet; Refill: 0

## 2018-10-01 ENCOUNTER — Ambulatory Visit (INDEPENDENT_AMBULATORY_CARE_PROVIDER_SITE_OTHER): Payer: Medicare Other

## 2018-10-01 ENCOUNTER — Telehealth: Payer: Self-pay | Admitting: Family Medicine

## 2018-10-01 DIAGNOSIS — E119 Type 2 diabetes mellitus without complications: Secondary | ICD-10-CM

## 2018-10-01 DIAGNOSIS — E669 Obesity, unspecified: Secondary | ICD-10-CM

## 2018-10-01 DIAGNOSIS — E1169 Type 2 diabetes mellitus with other specified complication: Secondary | ICD-10-CM

## 2018-10-01 DIAGNOSIS — F200 Paranoid schizophrenia: Secondary | ICD-10-CM

## 2018-10-01 DIAGNOSIS — I10 Essential (primary) hypertension: Secondary | ICD-10-CM | POA: Diagnosis not present

## 2018-10-01 DIAGNOSIS — R569 Unspecified convulsions: Secondary | ICD-10-CM

## 2018-10-01 NOTE — Telephone Encounter (Unsigned)
Copied from [place] [id number]. Topic: General - Inquiry >> Sep 22, 2018 11:16 AM Virl Axe D wrote: Reason for CRM:  Sharyn Lull with [facility] called stating because pt has had multiple falls and and a decline in health, she would like to suggest Dr. Ancil Boozer provide an order for a home health agency to have someone come in the pt's home to assist. Please advise. CB# [phone]

## 2018-10-01 NOTE — Telephone Encounter (Signed)
° °  Pt was in on 09/28/2018 and saw Raelyn Ensign and Sharyn Lull with [name] call to ask if the [place] will be approve since the pt has had a lot of falls    Michelle [phone]   Work cell   office number [phone]

## 2018-10-01 NOTE — Chronic Care Management (AMB) (Signed)
  Chronic Care Management   Follow Up Note   10/02/2018 Name: Lauren [name] MRN: [id number] DOB: 1951-06-03  Referred by: Steele Sizer, MD Reason for referral : No chief complaint on file.   Subjective: "I am doing good except for the tremors" "I want to go back to work and I am hopeful that I my neurologist will let me drive again but these tremors are so bad I don't know if he will"   Objective:  BP Readings from Last 3 Encounters:  09/28/18 134/82  09/10/18 134/86  09/10/18 (!) 170/100   Lab Results  Component Value Date   HGBA1C 6.3 08/03/2018    <no information>   Assessment:  Lauren Grieves Watlingtonis a 68 y.o.year old femalepatient of Dr. Steele Sizer, MD.Dr. [facility] the CCM team to consult today for assistance with chronic disease management related to community resources and education surrounding medicalconditions.Lauren [name] agreed that CCM services would be helpful toher. She was seen in the office12/10/19by the CCM Team to establish healthcare goals. Today I followed up with Lauren [name] by phone.  Goals Addressed    . COMPLETED: "I forgot that I had [employer]HC OTC benefits" (pt-stated)        Clinical Goals: Over the next 30 days, patient will verbalize utilization of UHC OTC benifits  Interventions: Discussed UHC OTC benefits with patient, patient given UHC OTC catalogue. CCM Clinic pharmacist reviewed catalogue and highlighted specific medications/DME that patient could benefit from.    [name] "I want to get these tremors under control" (pt-stated)       Lauren [name] continues to have uncontrollable tremors. She is unable to hold things steady including cups and utensils. She admits to spilling food and beverages on her if her cups are too full. She sees her neurologist next month and will discuss.    Clinical Goals: Over the next 30 days, patient will verbalize engagement with occupational therapy  Interventions: Discussed referral to OT  with PCP, discussed utilizing slip-on shoes and pull over clothing without buttons.  10/01/2018 Requested referral to [facility] from PCP specifically OT for assessment of need for assistive devices that could improve her ability to complete ADLS/feeding. Provided emotional support and reassurance as well as participated in active listening.      . COMPLETED: "I want to keep my BP and DM under control" (pt-stated)        Clinical Goals: Over the next 30 days, patient will verbalize continued stabilization of health status related to DM and HTN  Interventions: Discussed HTN and DM conditions, Review EMR        Telephone follow up appointment with CCM team member scheduled for: 2 weeks      Tita Terhaar E. Rollene Rotunda, RN, BSN Nurse Care Coordinator [facility] Care Management  [phone]

## 2018-10-01 NOTE — Telephone Encounter (Signed)
I already replied to this message, medicare requires face to face encounter, she must be seen

## 2018-10-02 NOTE — Telephone Encounter (Signed)
Lauren [name] the Care Manager is working on this.

## 2018-10-02 NOTE — Addendum Note (Signed)
Addended by: Hubbard Hartshorn on: 10/02/2018 01:07 PM   Modules accepted: Orders

## 2018-10-02 NOTE — Patient Instructions (Addendum)
1. It was a pleasure talking with you today. 2. We are working on a referral to [facility] per recommendation from [employer]. I have discussed this with Dr. Ancil Boozer and Raelyn Ensign. 3. Continue to take all your medications as prescribed. 4. CCM RN CM will follow up with you in 2 weeks and call you to update you on referral to [facility]. You can contact the CCM Team as need.  CCM (Chronic Care Management) Team   Trish Fountain RN, BSN Nurse Care Coordinator  [phone]  Ruben Reason PharmD  Clinical Pharmacist  [phone]  Goals Addressed            This Visit's Progress   . COMPLETED: "I forgot that I had UHC OTC benefits" (pt-stated)        Clinical Goals: Over the next 30 days, patient will verbalize utilization of UHC OTC benifits  Interventions: Discussed UHC OTC benefits with patient, patient given UHC OTC catalogue. CCM Clinic pharmacist reviewed catalogue and highlighted specific medications/DME that patient could benefit from.    [name] "I want to get these tremors under control" (pt-stated)        Clinical Goals: Over the next 30 days, patient will verbalize engagement with occupational therapy  Interventions: Discussed referral to OT with PCP, discussed utilizing slip-on shoes and pull over clothing without buttons.  10/01/2018 Requested referral to [facility] from PCP specifically OT for assessment of need for assistive devices that could improve her ability to complete ADLS/feeding       . COMPLETED: "I want to keep my BP and DM under control" (pt-stated)        Clinical Goals: Over the next 30 days, patient will verbalize continued stabilization of health status related to DM and HTN  Interventions: Discussed HTN and DM conditions, Review EMR

## 2018-10-06 ENCOUNTER — Other Ambulatory Visit: Payer: Self-pay

## 2018-10-06 MED ORDER — OMEPRAZOLE 20 MG PO CPDR: 20.0000 mg | DELAYED_RELEASE_CAPSULE | Freq: Two times a day (BID) | ORAL | 0 refills | Status: DC

## 2018-10-06 NOTE — Patient Outreach (Signed)
[facility]) Care Management  10/06/2018  Thelda Gagan [facility] 1951-05-01 [id number]   Medication Adherence call to Mrs. Flossie Dibble patient received a deliver every two weeks on a bubble pack when patient needs the medication or every two weeks from the pharmacy but, pharmacy bills every month. patient is due on Atorvastatin 40 mg and Metformin 500 mg patient already received the one for this month. Mrs. Nobis is showing past due under [place].   [place] Direct Dial [phone]  Fax [phone] [email or web address]

## 2018-10-06 NOTE — Telephone Encounter (Signed)
Refill request for general medication: Omeprazole 20 mg  Last office visit: 09/28/2018  Last physical exam: 05/26/2017  Follow-ups on file. 12/02/2018

## 2018-10-06 NOTE — Addendum Note (Signed)
Addended by: Hubbard Hartshorn on: 10/06/2018 07:38 AM   Modules accepted: Orders

## 2018-10-12 DIAGNOSIS — R682 Dry mouth, unspecified: Secondary | ICD-10-CM | POA: Diagnosis not present

## 2018-10-12 DIAGNOSIS — R251 Tremor, unspecified: Secondary | ICD-10-CM | POA: Diagnosis not present

## 2018-10-13 DIAGNOSIS — M069 Rheumatoid arthritis, unspecified: Secondary | ICD-10-CM | POA: Diagnosis not present

## 2018-10-13 DIAGNOSIS — Z7982 Long term (current) use of aspirin: Secondary | ICD-10-CM | POA: Diagnosis not present

## 2018-10-13 DIAGNOSIS — Z7984 Long term (current) use of oral hypoglycemic drugs: Secondary | ICD-10-CM | POA: Diagnosis not present

## 2018-10-13 DIAGNOSIS — E785 Hyperlipidemia, unspecified: Secondary | ICD-10-CM | POA: Diagnosis not present

## 2018-10-13 DIAGNOSIS — Z9181 History of falling: Secondary | ICD-10-CM | POA: Diagnosis not present

## 2018-10-13 DIAGNOSIS — D329 Benign neoplasm of meninges, unspecified: Secondary | ICD-10-CM | POA: Diagnosis not present

## 2018-10-13 DIAGNOSIS — I2510 Atherosclerotic heart disease of native coronary artery without angina pectoris: Secondary | ICD-10-CM | POA: Diagnosis not present

## 2018-10-13 DIAGNOSIS — K219 Gastro-esophageal reflux disease without esophagitis: Secondary | ICD-10-CM | POA: Diagnosis not present

## 2018-10-13 DIAGNOSIS — M1711 Unilateral primary osteoarthritis, right knee: Secondary | ICD-10-CM | POA: Diagnosis not present

## 2018-10-13 DIAGNOSIS — G4730 Sleep apnea, unspecified: Secondary | ICD-10-CM | POA: Diagnosis not present

## 2018-10-13 DIAGNOSIS — Z8673 Personal history of transient ischemic attack (TIA), and cerebral infarction without residual deficits: Secondary | ICD-10-CM | POA: Diagnosis not present

## 2018-10-13 DIAGNOSIS — I10 Essential (primary) hypertension: Secondary | ICD-10-CM | POA: Diagnosis not present

## 2018-10-13 DIAGNOSIS — Z96652 Presence of left artificial knee joint: Secondary | ICD-10-CM | POA: Diagnosis not present

## 2018-10-13 DIAGNOSIS — M5106 Intervertebral disc disorders with myelopathy, lumbar region: Secondary | ICD-10-CM | POA: Diagnosis not present

## 2018-10-13 DIAGNOSIS — E119 Type 2 diabetes mellitus without complications: Secondary | ICD-10-CM | POA: Diagnosis not present

## 2018-10-13 DIAGNOSIS — M797 Fibromyalgia: Secondary | ICD-10-CM | POA: Diagnosis not present

## 2018-10-14 ENCOUNTER — Telehealth: Payer: Self-pay | Admitting: Family Medicine

## 2018-10-14 DIAGNOSIS — Z7984 Long term (current) use of oral hypoglycemic drugs: Secondary | ICD-10-CM | POA: Diagnosis not present

## 2018-10-14 DIAGNOSIS — M5106 Intervertebral disc disorders with myelopathy, lumbar region: Secondary | ICD-10-CM | POA: Diagnosis not present

## 2018-10-14 DIAGNOSIS — Z8673 Personal history of transient ischemic attack (TIA), and cerebral infarction without residual deficits: Secondary | ICD-10-CM | POA: Diagnosis not present

## 2018-10-14 DIAGNOSIS — I10 Essential (primary) hypertension: Secondary | ICD-10-CM | POA: Diagnosis not present

## 2018-10-14 DIAGNOSIS — M069 Rheumatoid arthritis, unspecified: Secondary | ICD-10-CM | POA: Diagnosis not present

## 2018-10-14 DIAGNOSIS — Z96652 Presence of left artificial knee joint: Secondary | ICD-10-CM | POA: Diagnosis not present

## 2018-10-14 DIAGNOSIS — G4730 Sleep apnea, unspecified: Secondary | ICD-10-CM | POA: Diagnosis not present

## 2018-10-14 DIAGNOSIS — E785 Hyperlipidemia, unspecified: Secondary | ICD-10-CM | POA: Diagnosis not present

## 2018-10-14 DIAGNOSIS — M797 Fibromyalgia: Secondary | ICD-10-CM | POA: Diagnosis not present

## 2018-10-14 DIAGNOSIS — M1711 Unilateral primary osteoarthritis, right knee: Secondary | ICD-10-CM | POA: Diagnosis not present

## 2018-10-14 DIAGNOSIS — D329 Benign neoplasm of meninges, unspecified: Secondary | ICD-10-CM | POA: Diagnosis not present

## 2018-10-14 DIAGNOSIS — Z9181 History of falling: Secondary | ICD-10-CM | POA: Diagnosis not present

## 2018-10-14 DIAGNOSIS — Z7982 Long term (current) use of aspirin: Secondary | ICD-10-CM | POA: Diagnosis not present

## 2018-10-14 DIAGNOSIS — E119 Type 2 diabetes mellitus without complications: Secondary | ICD-10-CM | POA: Diagnosis not present

## 2018-10-14 DIAGNOSIS — I2510 Atherosclerotic heart disease of native coronary artery without angina pectoris: Secondary | ICD-10-CM | POA: Diagnosis not present

## 2018-10-14 DIAGNOSIS — K219 Gastro-esophageal reflux disease without esophagitis: Secondary | ICD-10-CM | POA: Diagnosis not present

## 2018-10-14 NOTE — Telephone Encounter (Signed)
Okay to give verbal orders?  ?

## 2018-10-14 NOTE — Telephone Encounter (Signed)
Colletta Maryland from [facility] was notified Dr. Ancil Boozer approved the OT for Ms. Swicegood.

## 2018-10-14 NOTE — Telephone Encounter (Signed)
Copied from [place] [id number]. Topic: Quick Communication - Home Health Verbal Orders >> Oct 14, 2018  9:31 AM Lennox Solders wrote: Caller/Agency: stephanie OT [facility] Callback Number: [phone] Frequency: OT 1x1 , 2x2, 1x1

## 2018-10-15 ENCOUNTER — Ambulatory Visit (INDEPENDENT_AMBULATORY_CARE_PROVIDER_SITE_OTHER): Payer: Medicare Other

## 2018-10-15 ENCOUNTER — Ambulatory Visit: Payer: Self-pay

## 2018-10-15 DIAGNOSIS — E1169 Type 2 diabetes mellitus with other specified complication: Secondary | ICD-10-CM | POA: Diagnosis not present

## 2018-10-15 DIAGNOSIS — E669 Obesity, unspecified: Secondary | ICD-10-CM | POA: Diagnosis not present

## 2018-10-15 DIAGNOSIS — F200 Paranoid schizophrenia: Secondary | ICD-10-CM

## 2018-10-15 DIAGNOSIS — I10 Essential (primary) hypertension: Secondary | ICD-10-CM | POA: Diagnosis not present

## 2018-10-15 DIAGNOSIS — F419 Anxiety disorder, unspecified: Secondary | ICD-10-CM

## 2018-10-15 NOTE — Telephone Encounter (Signed)
Darlina Guys with [facility] called to say that they received referral from Dr Ancil Boozer and want to inform that they will be going out to see patient on 10/16/2018

## 2018-10-15 NOTE — Chronic Care Management (AMB) (Signed)
  Chronic Care Management   Follow Up Note   10/15/2018 Name: Lauren [name] MRN: [id number] DOB: 11-May-1951  Referred by: Lauren Sizer, MD Reason for referral : Chronic Care Management (follow up telephone)   Subjective: "My doctor has released me to drive and to go back to work"   Objective:  Lab Results  Component Value Date   HGBA1C 6.3 08/03/2018   BP Readings from Last 3 Encounters:  09/28/18 134/82  09/10/18 134/86  09/10/18 (!) 170/100    Assessment: Lauren [name] a 68 y.o.year old femalepatient of Dr. Steele Sizer, MD.Dr. [name] the CCM team to consult today for assistance with chronic disease management related to community resources and education surroundingmedicalconditions.[name] She was seen in the office12/10/19by the CCM Team to establish healthcare goals. Today I followed up with Lauren [name] by phone.   Goals Addressed            This Visit's Progress   . COMPLETED: "I want to get these tremors under control" (pt-stated)        Lauren [name] confirms she will receive Occupational Therapy and initial treatment should begin tomorrow. She has been released to return to work and drive by her neurologist. She admits that her tremors are exacerbated by anxiety. She is hopeful that her stress and anxiety levels will reduce now that the stressor of not being able to drive or work is gone. She is hopeful that OT will have "tools" available to assist her with her ADLs.  Clinical Goals: Over the next 30 days, patient will verbalize engagement with occupational therapy  Interventions: Discussed referral to OT with PCP, discussed utilizing slip-on shoes and pull over clothing without buttons.  10/01/2018 Requested referral to [name] from PCP specifically OT for assessment of need for assistive devices that could improve her ability to complete ADLS/feeding  02/12/6811 Confirmed  patient is engaged with OT and her first visit will be 10/16/2018      . My medicine is expensive (pt-stated)       Lauren [name] is requesting assistance with medication cost. She was offered assistance by the [facility] Pharmacist however Lauren [name] chose to remain on pill pack for compliance. She continues to struggle with the cost of her medications and currently owes the pharmacy 150.00.  Clinical Goal(s): Over the next 10 days, LaurenOswaldo [name] will provide the necessary supplementary documents (proof of out of pocket prescription expenditure, proof of household income) needed for medication assistance applications to CCM pharmacist.   Interventions:  09/22/17: Patient states that Lauren [name] is covering her injection costs and she doesn't need to apply for assistance. She does have to pay for her maintenance medications at [facility] and owes about $140, but she likes having her medications bubble packaged (instead of using mail order).  10/15/2018  Notified CCM RN CM of conversation with patient regarding medication costs.   Discussed with patient reason for transferring from mail order to pill pack pharmacy  Reactivated previously completed goal for continued assistance  See Past Updates for further interventions      Plan: will follow up with patient in 3 weeks    Lauren [name] E. Lauren Rotunda, RN, BSN Nurse Care Coordinator [name] Care Management  [phone]

## 2018-10-15 NOTE — Patient Instructions (Signed)
  Thank you allowing the Chronic Care Management Team to be a part of your care! It was a pleasure speaking with you today!  1. We are so happy you have been released to drive and return to work! 2. Please engage with [place] OT. They may be able to help with your tremors 3. Continue to take all medications as prescribed. I understand they are costly. I will consult with [facility] Pharmacist) and have her to give you a call.  CCM (Chronic Care Management) Team   Trish Fountain RN, BSN Nurse Care Coordinator  [phone]  Ruben Reason PharmD  Clinical Pharmacist  [phone]   Goals Addressed            This Visit's Progress   . COMPLETED: "I want to get these tremors under control" (pt-stated)        Clinical Goals: Over the next 30 days, patient will verbalize engagement with occupational therapy  Interventions: Discussed referral to OT with PCP, discussed utilizing slip-on shoes and pull over clothing without buttons.  10/01/2018 Requested referral to [facility] from PCP specifically OT for assessment of need for assistive devices that could improve her ability to complete ADLS/feeding  10/19/5619 Confirmed patient is engaged with OT and her first visit will be 10/16/2018      . My medicine is expensive (pt-stated)       Clinical Goal(s): Over the next 10 days, Ms.Oswaldo Milian will provide the necessary supplementary documents (proof of out of pocket prescription expenditure, proof of household income) needed for medication assistance applications to CCM pharmacist.   Interventions:  09/22/17: Patient states that [name] is covering her injection costs and she doesn't need to apply for assistance. She does have to pay for her maintenance medications at [facility] and owes about $140, but she likes having her medications bubble packaged (instead of using mail order).  10/15/2018  Notified CCM RN CM of conversation with patient regarding medication costs.   Discussed  with patient reason for transferring from mail order to pill pack pharmacy  Reactivated previously completed goal for continued assistance  See Past Updates for further interventions    The patient verbalized understanding of instructions provided today and declined a print copy of patient instruction materials.   The CM team will reach out to the patient again over the next 30 days.  CCM RN CM

## 2018-10-16 DIAGNOSIS — I10 Essential (primary) hypertension: Secondary | ICD-10-CM | POA: Diagnosis not present

## 2018-10-16 DIAGNOSIS — R251 Tremor, unspecified: Secondary | ICD-10-CM | POA: Diagnosis not present

## 2018-10-16 DIAGNOSIS — E785 Hyperlipidemia, unspecified: Secondary | ICD-10-CM | POA: Diagnosis not present

## 2018-10-16 DIAGNOSIS — Z86011 Personal history of benign neoplasm of the brain: Secondary | ICD-10-CM | POA: Diagnosis not present

## 2018-10-16 DIAGNOSIS — Z9181 History of falling: Secondary | ICD-10-CM | POA: Diagnosis not present

## 2018-10-16 DIAGNOSIS — I700 Atherosclerosis of aorta: Secondary | ICD-10-CM | POA: Diagnosis not present

## 2018-10-16 DIAGNOSIS — Z7984 Long term (current) use of oral hypoglycemic drugs: Secondary | ICD-10-CM | POA: Diagnosis not present

## 2018-10-16 DIAGNOSIS — I2510 Atherosclerotic heart disease of native coronary artery without angina pectoris: Secondary | ICD-10-CM | POA: Diagnosis not present

## 2018-10-16 DIAGNOSIS — Z79891 Long term (current) use of opiate analgesic: Secondary | ICD-10-CM | POA: Diagnosis not present

## 2018-10-16 DIAGNOSIS — G4730 Sleep apnea, unspecified: Secondary | ICD-10-CM | POA: Diagnosis not present

## 2018-10-16 DIAGNOSIS — E1169 Type 2 diabetes mellitus with other specified complication: Secondary | ICD-10-CM | POA: Diagnosis not present

## 2018-10-16 DIAGNOSIS — Z96652 Presence of left artificial knee joint: Secondary | ICD-10-CM | POA: Diagnosis not present

## 2018-10-16 DIAGNOSIS — K219 Gastro-esophageal reflux disease without esophagitis: Secondary | ICD-10-CM | POA: Diagnosis not present

## 2018-10-16 DIAGNOSIS — M1711 Unilateral primary osteoarthritis, right knee: Secondary | ICD-10-CM | POA: Diagnosis not present

## 2018-10-16 DIAGNOSIS — Z7982 Long term (current) use of aspirin: Secondary | ICD-10-CM | POA: Diagnosis not present

## 2018-10-19 ENCOUNTER — Telehealth: Payer: Self-pay | Admitting: Family Medicine

## 2018-10-19 DIAGNOSIS — Z7982 Long term (current) use of aspirin: Secondary | ICD-10-CM | POA: Diagnosis not present

## 2018-10-19 DIAGNOSIS — Z8673 Personal history of transient ischemic attack (TIA), and cerebral infarction without residual deficits: Secondary | ICD-10-CM | POA: Diagnosis not present

## 2018-10-19 DIAGNOSIS — M1711 Unilateral primary osteoarthritis, right knee: Secondary | ICD-10-CM | POA: Diagnosis not present

## 2018-10-19 DIAGNOSIS — I2510 Atherosclerotic heart disease of native coronary artery without angina pectoris: Secondary | ICD-10-CM | POA: Diagnosis not present

## 2018-10-19 DIAGNOSIS — E119 Type 2 diabetes mellitus without complications: Secondary | ICD-10-CM | POA: Diagnosis not present

## 2018-10-19 DIAGNOSIS — D329 Benign neoplasm of meninges, unspecified: Secondary | ICD-10-CM | POA: Diagnosis not present

## 2018-10-19 DIAGNOSIS — Z9181 History of falling: Secondary | ICD-10-CM | POA: Diagnosis not present

## 2018-10-19 DIAGNOSIS — K219 Gastro-esophageal reflux disease without esophagitis: Secondary | ICD-10-CM | POA: Diagnosis not present

## 2018-10-19 DIAGNOSIS — Z96652 Presence of left artificial knee joint: Secondary | ICD-10-CM | POA: Diagnosis not present

## 2018-10-19 DIAGNOSIS — M069 Rheumatoid arthritis, unspecified: Secondary | ICD-10-CM | POA: Diagnosis not present

## 2018-10-19 DIAGNOSIS — M5106 Intervertebral disc disorders with myelopathy, lumbar region: Secondary | ICD-10-CM | POA: Diagnosis not present

## 2018-10-19 DIAGNOSIS — I10 Essential (primary) hypertension: Secondary | ICD-10-CM | POA: Diagnosis not present

## 2018-10-19 DIAGNOSIS — E785 Hyperlipidemia, unspecified: Secondary | ICD-10-CM | POA: Diagnosis not present

## 2018-10-19 DIAGNOSIS — Z7984 Long term (current) use of oral hypoglycemic drugs: Secondary | ICD-10-CM | POA: Diagnosis not present

## 2018-10-19 DIAGNOSIS — M797 Fibromyalgia: Secondary | ICD-10-CM | POA: Diagnosis not present

## 2018-10-19 DIAGNOSIS — G4730 Sleep apnea, unspecified: Secondary | ICD-10-CM | POA: Diagnosis not present

## 2018-10-19 NOTE — Telephone Encounter (Signed)
Copied from [place] [id number]. Topic: Quick Communication - Home Health Verbal Orders >> Oct 19, 2018  2:43 PM Berneta Levins wrote: Caller/Agency: Wes with [facility] Number: [phone] Requesting OT/PT/Skilled Nursing/Social Work: speech therapy Frequency: Wes feels like patient is not in need of Physical Therapy, but that she could benefit from speech therapy.  If PCP is agreeable please fax orders to [phone]

## 2018-10-19 NOTE — Telephone Encounter (Signed)
Okay to give verbal order.

## 2018-10-20 DIAGNOSIS — K219 Gastro-esophageal reflux disease without esophagitis: Secondary | ICD-10-CM | POA: Diagnosis not present

## 2018-10-20 DIAGNOSIS — Z7984 Long term (current) use of oral hypoglycemic drugs: Secondary | ICD-10-CM | POA: Diagnosis not present

## 2018-10-20 DIAGNOSIS — E119 Type 2 diabetes mellitus without complications: Secondary | ICD-10-CM | POA: Diagnosis not present

## 2018-10-20 DIAGNOSIS — E785 Hyperlipidemia, unspecified: Secondary | ICD-10-CM | POA: Diagnosis not present

## 2018-10-20 DIAGNOSIS — M5106 Intervertebral disc disorders with myelopathy, lumbar region: Secondary | ICD-10-CM | POA: Diagnosis not present

## 2018-10-20 DIAGNOSIS — D329 Benign neoplasm of meninges, unspecified: Secondary | ICD-10-CM | POA: Diagnosis not present

## 2018-10-20 DIAGNOSIS — Z9181 History of falling: Secondary | ICD-10-CM | POA: Diagnosis not present

## 2018-10-20 DIAGNOSIS — G4730 Sleep apnea, unspecified: Secondary | ICD-10-CM | POA: Diagnosis not present

## 2018-10-20 DIAGNOSIS — M797 Fibromyalgia: Secondary | ICD-10-CM | POA: Diagnosis not present

## 2018-10-20 DIAGNOSIS — Z96652 Presence of left artificial knee joint: Secondary | ICD-10-CM | POA: Diagnosis not present

## 2018-10-20 DIAGNOSIS — I10 Essential (primary) hypertension: Secondary | ICD-10-CM | POA: Diagnosis not present

## 2018-10-20 DIAGNOSIS — M069 Rheumatoid arthritis, unspecified: Secondary | ICD-10-CM | POA: Diagnosis not present

## 2018-10-20 DIAGNOSIS — I2510 Atherosclerotic heart disease of native coronary artery without angina pectoris: Secondary | ICD-10-CM | POA: Diagnosis not present

## 2018-10-20 DIAGNOSIS — Z7982 Long term (current) use of aspirin: Secondary | ICD-10-CM | POA: Diagnosis not present

## 2018-10-20 DIAGNOSIS — M1711 Unilateral primary osteoarthritis, right knee: Secondary | ICD-10-CM | POA: Diagnosis not present

## 2018-10-20 DIAGNOSIS — Z8673 Personal history of transient ischemic attack (TIA), and cerebral infarction without residual deficits: Secondary | ICD-10-CM | POA: Diagnosis not present

## 2018-10-21 DIAGNOSIS — M5106 Intervertebral disc disorders with myelopathy, lumbar region: Secondary | ICD-10-CM | POA: Diagnosis not present

## 2018-10-21 DIAGNOSIS — E785 Hyperlipidemia, unspecified: Secondary | ICD-10-CM | POA: Diagnosis not present

## 2018-10-21 DIAGNOSIS — M797 Fibromyalgia: Secondary | ICD-10-CM | POA: Diagnosis not present

## 2018-10-21 DIAGNOSIS — Z7982 Long term (current) use of aspirin: Secondary | ICD-10-CM | POA: Diagnosis not present

## 2018-10-21 DIAGNOSIS — I10 Essential (primary) hypertension: Secondary | ICD-10-CM | POA: Diagnosis not present

## 2018-10-21 DIAGNOSIS — M1711 Unilateral primary osteoarthritis, right knee: Secondary | ICD-10-CM | POA: Diagnosis not present

## 2018-10-21 DIAGNOSIS — Z7984 Long term (current) use of oral hypoglycemic drugs: Secondary | ICD-10-CM | POA: Diagnosis not present

## 2018-10-21 DIAGNOSIS — D329 Benign neoplasm of meninges, unspecified: Secondary | ICD-10-CM | POA: Diagnosis not present

## 2018-10-21 DIAGNOSIS — I2510 Atherosclerotic heart disease of native coronary artery without angina pectoris: Secondary | ICD-10-CM | POA: Diagnosis not present

## 2018-10-21 DIAGNOSIS — K219 Gastro-esophageal reflux disease without esophagitis: Secondary | ICD-10-CM | POA: Diagnosis not present

## 2018-10-21 DIAGNOSIS — Z8673 Personal history of transient ischemic attack (TIA), and cerebral infarction without residual deficits: Secondary | ICD-10-CM | POA: Diagnosis not present

## 2018-10-21 DIAGNOSIS — M069 Rheumatoid arthritis, unspecified: Secondary | ICD-10-CM | POA: Diagnosis not present

## 2018-10-21 DIAGNOSIS — Z9181 History of falling: Secondary | ICD-10-CM | POA: Diagnosis not present

## 2018-10-21 DIAGNOSIS — E119 Type 2 diabetes mellitus without complications: Secondary | ICD-10-CM | POA: Diagnosis not present

## 2018-10-21 DIAGNOSIS — G4730 Sleep apnea, unspecified: Secondary | ICD-10-CM | POA: Diagnosis not present

## 2018-10-21 DIAGNOSIS — Z96652 Presence of left artificial knee joint: Secondary | ICD-10-CM | POA: Diagnosis not present

## 2018-10-21 NOTE — Telephone Encounter (Signed)
Cindy with [facility] calling to let Dr. Ancil Boozer know this pt is already enrolled with another home health agency. CB#: [phone]

## 2018-10-22 ENCOUNTER — Ambulatory Visit (INDEPENDENT_AMBULATORY_CARE_PROVIDER_SITE_OTHER): Payer: Medicare Other

## 2018-10-22 VITALS — BP 122/78 | HR 94 | Temp 97.9°F | Resp 17 | Ht 65.0 in | Wt 246.3 lb

## 2018-10-22 DIAGNOSIS — Z1231 Encounter for screening mammogram for malignant neoplasm of breast: Secondary | ICD-10-CM | POA: Diagnosis not present

## 2018-10-22 DIAGNOSIS — Z0000 Encounter for general adult medical examination without abnormal findings: Secondary | ICD-10-CM | POA: Diagnosis not present

## 2018-10-22 NOTE — Patient Instructions (Signed)
Lauren [name] , Thank you for taking time to come for your Medicare Wellness Visit. I appreciate your ongoing commitment to your health goals. Please review the following plan we discussed and let me know if I can assist you in the future.   Screening recommendations/referrals: Colonoscopy: done 12/09/13. Repeat in 2025 Mammogram: done 04/30/17. Please call [phone] to schedule your mammogram and bone density exam.  Recommended yearly ophthalmology/optometry visit for glaucoma screening and checkup Recommended yearly dental visit for hygiene and checkup  Vaccinations: Influenza vaccine: done 05/07/18 Pneumococcal vaccine: done 04/01/18 Tdap vaccine: done 08/20/10 Shingles vaccine: Shingrix discussed. Please contact your pharmacy for coverage information.     Advanced directives: Advance directive discussed with you today. I have provided a copy for you to complete at home and have notarized. Once this is complete please bring a copy in to our office so we can scan it into your chart.  Conditions/risks identified: Recommend healthy eating and physical activity for desired weight loss.   Next appointment: Please follow up in one year for your Medicare Annual Wellness visit.     Preventive Care 66 Years and Older, Female Preventive care refers to lifestyle choices and visits with your health care provider that can promote health and wellness. What does preventive care include?  A yearly physical exam. This is also called an annual well check.  Dental exams once or twice a year.  Routine eye exams. Ask your health care provider how often you should have your eyes checked.  Personal lifestyle choices, including:  Daily care of your teeth and gums.  Regular physical activity.  Eating a healthy diet.  Avoiding tobacco and drug use.  Limiting alcohol use.  Practicing safe sex.  Taking low-dose aspirin every day.  Taking vitamin and mineral supplements as recommended by your  health care provider. What happens during an annual well check? The services and screenings done by your health care provider during your annual well check will depend on your age, overall health, lifestyle risk factors, and family history of disease. Counseling  Your health care provider may ask you questions about your:  Alcohol use.  Tobacco use.  Drug use.  Emotional well-being.  Home and relationship well-being.  Sexual activity.  Eating habits.  History of falls.  Memory and ability to understand (cognition).  Work and work Statistician.  Reproductive health. Screening  You may have the following tests or measurements:  Height, weight, and BMI.  Blood pressure.  Lipid and cholesterol levels. These may be checked every 5 years, or more frequently if you are over 79 years old.  Skin check.  Lung cancer screening. You may have this screening every year starting at age 64 if you have a 30-pack-year history of smoking and currently smoke or have quit within the past 15 years.  Fecal occult blood test (FOBT) of the stool. You may have this test every year starting at age 18.  Flexible sigmoidoscopy or colonoscopy. You may have a sigmoidoscopy every 5 years or a colonoscopy every 10 years starting at age 31.  Hepatitis C blood test.  Hepatitis B blood test.  Sexually transmitted disease (STD) testing.  Diabetes screening. This is done by checking your blood sugar (glucose) after you have not eaten for a while (fasting). You may have this done every 1-3 years.  Bone density scan. This is done to screen for osteoporosis. You may have this done starting at age 69.  Mammogram. This may be done every 1-2 years. Talk  to your health care provider about how often you should have regular mammograms. Talk with your health care provider about your test results, treatment options, and if necessary, the need for more tests. Vaccines  Your health care provider may recommend  certain vaccines, such as:  Influenza vaccine. This is recommended every year.  Tetanus, diphtheria, and acellular pertussis (Tdap, Td) vaccine. You may need a Td booster every 10 years.  Zoster vaccine. You may need this after age 1.  Pneumococcal 13-valent conjugate (PCV13) vaccine. One dose is recommended after age 45.  Pneumococcal polysaccharide (PPSV23) vaccine. One dose is recommended after age 71. Talk to your health care provider about which screenings and vaccines you need and how often you need them. This information is not intended to replace advice given to you by your health care provider. Make sure you discuss any questions you have with your health care provider. Document Released: 09/22/2015 Document Revised: 05/15/2016 Document Reviewed: 06/27/2015 Elsevier Interactive Patient Education  2017 [employer] Prevention in the Home Falls can cause injuries. They can happen to people of all ages. There are many things you can do to make your home safe and to help prevent falls. What can I do on the outside of my home?  Regularly fix the edges of walkways and driveways and fix any cracks.  Remove anything that might make you trip as you walk through a door, such as a raised step or threshold.  Trim any bushes or trees on the path to your home.  Use bright outdoor lighting.  Clear any walking paths of anything that might make someone trip, such as rocks or tools.  Regularly check to see if handrails are loose or broken. Make sure that both sides of any steps have handrails.  Any raised decks and porches should have guardrails on the edges.  Have any leaves, snow, or ice cleared regularly.  Use sand or salt on walking paths during winter.  Clean up any spills in your garage right away. This includes oil or grease spills. What can I do in the bathroom?  Use night lights.  Install grab bars by the toilet and in the tub and shower. Do not use towel bars as  grab bars.  Use non-skid mats or decals in the tub or shower.  If you need to sit down in the shower, use a plastic, non-slip stool.  Keep the floor dry. Clean up any water that spills on the floor as soon as it happens.  Remove soap buildup in the tub or shower regularly.  Attach bath mats securely with double-sided non-slip rug tape.  Do not have throw rugs and other things on the floor that can make you trip. What can I do in the bedroom?  Use night lights.  Make sure that you have a light by your bed that is easy to reach.  Do not use any sheets or blankets that are too big for your bed. They should not hang down onto the floor.  Have a firm chair that has side arms. You can use this for support while you get dressed.  Do not have throw rugs and other things on the floor that can make you trip. What can I do in the kitchen?  Clean up any spills right away.  Avoid walking on wet floors.  Keep items that you use a lot in easy-to-reach places.  If you need to reach something above you, use a strong step stool that  has a grab bar.  Keep electrical cords out of the way.  Do not use floor polish or wax that makes floors slippery. If you must use wax, use non-skid floor wax.  Do not have throw rugs and other things on the floor that can make you trip. What can I do with my stairs?  Do not leave any items on the stairs.  Make sure that there are handrails on both sides of the stairs and use them. Fix handrails that are broken or loose. Make sure that handrails are as long as the stairways.  Check any carpeting to make sure that it is firmly attached to the stairs. Fix any carpet that is loose or worn.  Avoid having throw rugs at the top or bottom of the stairs. If you do have throw rugs, attach them to the floor with carpet tape.  Make sure that you have a light switch at the top of the stairs and the bottom of the stairs. If you do not have them, ask someone to add them  for you. What else can I do to help prevent falls?  Wear shoes that:  Do not have high heels.  Have rubber bottoms.  Are comfortable and fit you well.  Are closed at the toe. Do not wear sandals.  If you use a stepladder:  Make sure that it is fully opened. Do not climb a closed stepladder.  Make sure that both sides of the stepladder are locked into place.  Ask someone to hold it for you, if possible.  Clearly mark and make sure that you can see:  Any grab bars or handrails.  First and last steps.  Where the edge of each step is.  Use tools that help you move around (mobility aids) if they are needed. These include:  Canes.  Walkers.  Scooters.  Crutches.  Turn on the lights when you go into a dark area. Replace any light bulbs as soon as they burn out.  Set up your furniture so you have a clear path. Avoid moving your furniture around.  If any of your floors are uneven, fix them.  If there are any pets around you, be aware of where they are.  Review your medicines with your doctor. Some medicines can make you feel dizzy. This can increase your chance of falling. Ask your doctor what other things that you can do to help prevent falls. This information is not intended to replace advice given to you by your health care provider. Make sure you discuss any questions you have with your health care provider. Document Released: 06/22/2009 Document Revised: 02/01/2016 Document Reviewed: 09/30/2014 Elsevier Interactive Patient Education  2017 [employer].

## 2018-10-22 NOTE — Progress Notes (Signed)
Subjective:   Lauren [name] is a 68 y.o. female who presents for Medicare Annual (Subsequent) preventive examination.  Review of Systems:   Cardiac Risk Factors include: advanced age (>64men, >65 women);diabetes mellitus;dyslipidemia;hypertension;obesity (BMI >30kg/m2)     Objective:     Vitals: BP 122/78 (BP Location: Right Arm, Patient Position: Sitting, Cuff Size: Large)   Pulse 94   Temp 97.9 F (36.6 C) (Oral)   Resp 17   Ht 5\' 5"  (1.651 m)   Wt 246 lb 4.8 oz (111.7 kg)   SpO2 93%   BMI 40.99 kg/m   Body mass index is 40.99 kg/m.  Advanced Directives 10/22/2018 09/10/2018 08/18/2018 06/23/2018 11/03/2017 09/06/2017 08/26/2017  Does Patient Have a Medical Advance Directive? No No No No No No No  Would patient like information on creating a medical advance directive? Yes (MAU/Ambulatory/Procedural Areas - Information given) - Yes (MAU/Ambulatory/Procedural Areas - Information given) No - Patient declined - No - Patient declined -  Some encounter information is confidential and restricted. Go to Review Flowsheets activity to see all data.    Tobacco Social History   Tobacco Use  Smoking Status Never Smoker  Smokeless Tobacco Never Used     Counseling given: Not Answered   Clinical Intake:  Pre-visit preparation completed: Yes  Pain : 0-10 Pain Score: 3  Pain Type: Chronic pain Pain Location: Knee Pain Orientation: (bilateral) Pain Descriptors / Indicators: Aching(stiffness) Pain Onset: More than a month ago Pain Frequency: Constant     BMI - recorded: 40.99 Nutritional Status: BMI > 30  Obese Nutritional Risks: None Diabetes: Yes CBG done?: No Did pt. bring in CBG monitor from home?: No   Nutrition Risk Assessment:  Has the patient had any N/V/D within the last 2 months?  No  Does the patient have any non-healing wounds?  No  Has the patient had any unintentional weight loss or weight gain?  No   Diabetes:  Is the patient diabetic?  Yes   If diabetic, was a CBG obtained today?  No  Did the patient bring in their glucometer from home?  No  How often do you monitor your CBG's? Patient does not actively check her blood sugar.   Financial Strains and Diabetes Management:  Are you having any financial strains with the device, your supplies or your medication? No .  Does the patient want to be seen by Chronic Care Management for management of their diabetes?  No  Would the patient like to be referred to a Nutritionist or for Diabetic Management?  No   Diabetic Exams:  Diabetic Eye Exam: Completed 03/02/18 no retinopathy.   Diabetic Foot Exam: Completed 12/09/17.   How often do you need to have someone help you when you read instructions, pamphlets, or other written materials from your doctor or pharmacy?: 1 - Never What is the last grade level you completed in school?: 12th grade  Interpreter Needed?: No  Information entered by :: Clemetine Marker LPN  Past Medical History:  Diagnosis Date  . Anxiety   . Apnea, sleep 02/02/2014  . Awareness of heartbeats 02/02/2014  . Breathlessness on exertion 02/02/2014  . Diabetes mellitus   . Encounter for pre-employment examination 06/29/2015  . Excessive sweating 07/05/2015  . Fibromyalgia   . GERD (gastroesophageal reflux disease)   . Gravida 1 10/26/2015   1.    . Heart murmur   . Herniated disc   . Hyperlipidemia   . Hypertension   . Itch of skin  10/26/2015  . Lack of bladder control   . Lung tumor   . Rheumatoid arthritis ([place])   . Schizoaffective disorder ([place])   . Screening for cervical cancer 07/29/2017  . 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 last seizure 2013!  [name] Sex counseling 10/26/2015  . Sleep apnea   . 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 Stiffness of both knees 06/22/2015   Past Surgical History:  Procedure Laterality Date  . BRAIN TUMOR EXCISION  2012   benign  . CARDIAC CATHETERIZATION  2008  . CESAREAN SECTION    .  CYST REMOVAL HAND    . LUNG LOBECTOMY  1977   benign tumor  . 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 Heart disease Brother   . Depression Mother   . Heart attack Mother   . Stroke Mother   . Alcohol abuse Father   . Stroke Father   . Diabetes Sister   . Diabetes Sister   . Stomach cancer Sister   . Kidney disease Sister   . COPD Brother   . Lung cancer Brother   . Diabetes Brother    Social History   Socioeconomic History  . Marital status: Single    Spouse name: Not on file  . Number of children: 1  . Years of education: Not on file  . Highest education level: 12th grade  Occupational History  . Not on file  Social Needs  . Financial resource strain: Somewhat hard  . Food insecurity:    Worry: Sometimes true    Inability: Never true  . Transportation needs:    Medical: No    Non-medical: No  Tobacco Use  . Smoking status: Never Smoker  . Smokeless tobacco: Never Used  Substance and Sexual Activity  . Alcohol use: No    Alcohol/week: 0.0 standard drinks  . Drug use: No  . Sexual activity: Not Currently  Lifestyle  . Physical activity:    Days per week: 0 days    Minutes per session: 0 min  . Stress: To some extent  Relationships  . Social connections:    Talks on phone: More than three times a week    Gets together: Three times a week    Attends religious service: More than 4 times per year    Active member of club or organization: No    Attends meetings of clubs or organizations: Never    Relationship status: Married  Other Topics Concern  . Not on file  Social History Narrative  . Not on file    Outpatient Encounter Medications as of 10/22/2018  Medication Sig  . Artificial Saliva (BIOTENE DRY MOUTH MOISTURIZING) SOLN Use as directed 1 application in the mouth or throat  2 (two) times daily.  . Ascorbic Acid (VITAMIN C) 500 MG CHEW Chew 500 mg by mouth daily.  [name] aspirin EC 81 MG tablet Take 1 tablet (81 mg total) by mouth daily before breakfast.  . atorvastatin (LIPITOR) 40 MG tablet Take 1 tablet (40 mg total) by mouth daily at 6 PM.  . benztropine (COGENTIN) 1 MG tablet Take 1 tab in the morning and 2 tabs at night  . clotrimazole (MYCELEX) 10 MG troche Take 1 tablet (10 mg total) by mouth 5 (five)  times daily.  . Dentifrices (BIOTENE DRY MOUTH GENTLE) PSTE Place 1 application onto teeth 2 (two) times daily.  Water engineer Bandages & Supports (KNEE COMPRESSION SLEEVE/L/XL) MISC 2 each by Does not apply route as needed. Wear during day; take off at night  . escitalopram (LEXAPRO) 10 MG tablet   . gabapentin (NEURONTIN) 100 MG capsule Take 100 mg by mouth 2 (two) times daily.  . haloperidol decanoate (HALDOL DECANOATE) 100 MG/ML injection Inject 1 mL (100 mg total) into the muscle every 28 (twenty-eight) days. Next injection due on 01/27/18  . hydrochlorothiazide (HYDRODIURIL) 25 MG tablet Take 1 tablet (25 mg total) by mouth daily.  [name] ibuprofen (ADVIL,MOTRIN) 200 MG tablet Take 600 mg by mouth every 8 (eight) hours as needed (for knee pain.).  [name] lisinopril (PRINIVIL,ZESTRIL) 20 MG tablet Take 1 tablet (20 mg total) by mouth daily.  . metFORMIN (GLUCOPHAGE) 500 MG tablet Take 1 tablet (500 mg total) by mouth 2 (two) times daily with a meal.  . omeprazole (PRILOSEC) 20 MG capsule Take 1 capsule (20 mg total) by mouth 2 (two) times daily.  . traZODone (DESYREL) 50 MG tablet Take 1 tablet (50 mg total) by mouth at bedtime.   No facility-administered encounter medications on file as of 10/22/2018.     Activities of Daily Living In your present state of health, do you have any difficulty performing the following activities: 10/22/2018 08/18/2018  Hearing? Y -  Comment mild hearing difficulty; declines hearing aids -  Vision? N -  Comment wears glasses -  Difficulty  concentrating or making decisions? Y -  Walking or climbing stairs? Y -  Comment knee pain -  Dressing or bathing? N -  Doing errands, shopping? N -  Preparing Food and eating ? N (No Data)  Comment - related to tremors  Using the Toilet? N -  In the past six months, have you accidently leaked urine? N -  Do you have problems with loss of bowel control? N -  Managing your Medications? N -  Managing your Finances? N -  Housekeeping or managing your Housekeeping? N -  Some encounter information is confidential and restricted. Go to Review Flowsheets activity to see all data.  Some recent data might be hidden    Patient Care Team: Steele Sizer, MD as PCP - General (Family Medicine) Pleasant, Eppie Gibson, RN as [place] [facility]. (Psychiatry) Deetta Perla, MD as Consulting Physician (Neurosurgery) Anabel Bene, MD as Referring Physician (Neurology) Noreene Filbert, MD as Referring Physician (Radiation Oncology) Cathi Roan, [facility] (Pharmacist) Benedetto Goad, RN as Case Manager    Assessment:   This is a routine wellness examination for Lauren [name].  Exercise Activities and Dietary recommendations Current Exercise Habits: Home exercise routine, Exercise limited by: respiratory conditions(s);orthopedic condition(s)  Goals    . My medicine is expensive (pt-stated)     Clinical Goal(s): Over the next 10 days, Ms.Oswaldo Milian will provide the necessary supplementary documents (proof of out of pocket prescription expenditure, proof of household income) needed for medication assistance applications to CCM pharmacist.   Interventions:  09/22/17: Patient states that [name] is covering her injection costs and she doesn't need to apply for assistance. She does have to pay for her maintenance medications at [facility] and owes about $140, but she likes having her medications bubble packaged (instead of  using mail order).  10/15/2018  Notified CCM RN CM of conversation  with patient regarding medication costs.   Discussed with patient reason for transferring from mail order to pill pack pharmacy  Reactivated previously completed goal for continued assistance  See Past Updates for further interventions     . Weight (lb) < 200 lb (90.7 kg)     Recommend weight loss of 60+ pounds. Discussed what a healthy diet consists of.        Fall Risk Fall Risk  10/22/2018 09/28/2018 09/10/2018 08/03/2018 06/23/2018  Falls in the past year? 1 1 1 1  No  Number falls in past yr: 1 1 1  0 -  Injury with Fall? 0 0 0 0 -  Risk for fall due to : History of fall(s) - - - -  Follow up Falls prevention discussed Falls evaluation completed - - -   FALL RISK PREVENTION PERTAINING TO THE HOME:  Any stairs in or around the home? No  If so, are they are without handrails? No   Home free of loose throw rugs in walkways, pet beds, electrical cords, etc? Yes  Adequate lighting in your home to reduce risk of falls? Yes   ASSISTIVE DEVICES UTILIZED TO PREVENT FALLS:  Life alert? No  Use of a cane, walker or w/c? No  Grab bars in the bathroom? No  Shower chair or bench in shower? No  Elevated toilet seat or a handicapped toilet? No   DME ORDERS:  DME order needed?  No   TIMED UP AND GO:  Was the test performed? Yes .  Length of time to ambulate 10 feet: 7 sec.   GAIT:  Appearance of gait: Gait stead-fast and without the use of an assistive device.    Education: Fall risk prevention has been discussed.  Intervention(s) required? Needs hand rails in bathroom. Pt to ask apartment Press photographer.   Depression Screen PHQ 2/9 Scores 10/22/2018 09/28/2018 08/03/2018 06/23/2018  PHQ - 2 Score 0 0 2 0  PHQ- 9 Score 8 0 12 0     Cognitive Function     6CIT Screen 10/22/2018 05/26/2017  What Year? 0 points 0 points  What month? 0 points 0 points  What time? 0 points 0 points  Count back from 20 2  points 0 points  Months in reverse 2 points 0 points  Repeat phrase 2 points 2 points  Total Score 6 2    Immunization History  Administered Date(s) Administered  . Influenza, High Dose Seasonal PF 05/07/2018  . Influenza,inj,Quad PF,6+ Mos 06/22/2015, 05/30/2016, 05/26/2017  . Pneumococcal Conjugate-13 04/01/2018    Qualifies for Shingles Vaccine? Yes . Due for Shingrix. Education has been provided regarding the importance of this vaccine. Pt has been advised to call insurance company to determine out of pocket expense. Advised may also receive vaccine at local pharmacy or Health Dept. Verbalized acceptance and understanding.  Tdap: Up to date  Flu Vaccine: Up to date  Pneumococcal Vaccine:Up to date   Screening Tests Health Maintenance  Topic Date Due  . DEXA SCAN  01/08/2018  . MAMMOGRAM  04/30/2018  . FOOT EXAM  12/10/2018  . HEMOGLOBIN A1C  02/01/2019  . OPHTHALMOLOGY EXAM  03/03/2019  . PNA vac Low Risk Adult (2 of 2 - PPSV23) 04/02/2019  . TETANUS/TDAP  08/20/2020  . COLONOSCOPY  12/10/2023  . INFLUENZA VACCINE  Completed  . Hepatitis C Screening  Completed  . HIV Screening  Completed  . PAP SMEAR-Modifier  Discontinued    Cancer Screenings:  Colorectal Screening: Completed 12/09/13. Repeat  every 10 years.  Mammogram: Completed 04/30/17. Repeat every year;Ordered today. Pt provided with contact information and advised to call to schedule appt.   Bone Density: Ordered 04/01/18  Lung Cancer Screening: (Low Dose CT Chest recommended if Age 59-80 years, 30 pack-year currently smoking OR have quit w/in 15years.) does not qualify.   Additional Screening:  Hepatitis C Screening: does qualify; Completed 05/26/17  Vision Screening: Recommended annual ophthalmology exams for early detection of glaucoma and other disorders of the eye. Is the patient up to date with their annual eye exam?  Yes  Who is the provider or what is the name of the office in which the pt attends  annual eye exams? [place]  Dental Screening: Recommended annual dental exams for proper oral hygiene  Community Resource Referral:  CRR required this visit?  No      Plan:     I have personally reviewed and addressed the Medicare Annual Wellness questionnaire and have noted the following in the patient's chart:  A. Medical and social history B. Use of alcohol, tobacco or illicit drugs  C. Current medications and supplements D. Functional ability and status E.  Nutritional status F.  Physical activity G. Advance directives H. List of other physicians I.  Hospitalizations, surgeries, and ER visits in previous 12 months J.  [place] such as hearing and vision if needed, cognitive and depression L. Referrals and appointments   In addition, I have reviewed and discussed with patient certain preventive protocols, quality metrics, and best practice recommendations. A written personalized care plan for preventive services as well as general preventive health recommendations were provided to patient.   Signed,  Clemetine Marker, LPN Nurse Health Advisor   Nurse Notes: Pt c/o bilateral knee pain and [facility]. She is interested in an order for speech therapy with them as well due to difficulty concentrating and stuttering. She has severe dry mouth due to medications and uses biotene products for relief. Pt also c/o financial difficulties with food and medications but receives assistance from [employer] and plans to contact her Education officer, museum to reapply for [employer]. She decline resource referral for today but advised to let us know if she has any needs in the future.

## 2018-10-23 DIAGNOSIS — M069 Rheumatoid arthritis, unspecified: Secondary | ICD-10-CM | POA: Diagnosis not present

## 2018-10-23 DIAGNOSIS — Z8673 Personal history of transient ischemic attack (TIA), and cerebral infarction without residual deficits: Secondary | ICD-10-CM | POA: Diagnosis not present

## 2018-10-23 DIAGNOSIS — M1711 Unilateral primary osteoarthritis, right knee: Secondary | ICD-10-CM | POA: Diagnosis not present

## 2018-10-23 DIAGNOSIS — M5106 Intervertebral disc disorders with myelopathy, lumbar region: Secondary | ICD-10-CM | POA: Diagnosis not present

## 2018-10-23 DIAGNOSIS — Z9181 History of falling: Secondary | ICD-10-CM | POA: Diagnosis not present

## 2018-10-23 DIAGNOSIS — E785 Hyperlipidemia, unspecified: Secondary | ICD-10-CM | POA: Diagnosis not present

## 2018-10-23 DIAGNOSIS — Z7982 Long term (current) use of aspirin: Secondary | ICD-10-CM | POA: Diagnosis not present

## 2018-10-23 DIAGNOSIS — Z96652 Presence of left artificial knee joint: Secondary | ICD-10-CM | POA: Diagnosis not present

## 2018-10-23 DIAGNOSIS — I2510 Atherosclerotic heart disease of native coronary artery without angina pectoris: Secondary | ICD-10-CM | POA: Diagnosis not present

## 2018-10-23 DIAGNOSIS — I10 Essential (primary) hypertension: Secondary | ICD-10-CM | POA: Diagnosis not present

## 2018-10-23 DIAGNOSIS — K219 Gastro-esophageal reflux disease without esophagitis: Secondary | ICD-10-CM | POA: Diagnosis not present

## 2018-10-23 DIAGNOSIS — M797 Fibromyalgia: Secondary | ICD-10-CM | POA: Diagnosis not present

## 2018-10-23 DIAGNOSIS — G4730 Sleep apnea, unspecified: Secondary | ICD-10-CM | POA: Diagnosis not present

## 2018-10-23 DIAGNOSIS — D329 Benign neoplasm of meninges, unspecified: Secondary | ICD-10-CM | POA: Diagnosis not present

## 2018-10-23 DIAGNOSIS — E119 Type 2 diabetes mellitus without complications: Secondary | ICD-10-CM | POA: Diagnosis not present

## 2018-10-23 DIAGNOSIS — Z7984 Long term (current) use of oral hypoglycemic drugs: Secondary | ICD-10-CM | POA: Diagnosis not present

## 2018-10-26 ENCOUNTER — Ambulatory Visit: Payer: Medicare Other | Attending: Radiation Oncology | Admitting: Radiation Oncology

## 2018-10-26 DIAGNOSIS — Z8673 Personal history of transient ischemic attack (TIA), and cerebral infarction without residual deficits: Secondary | ICD-10-CM | POA: Diagnosis not present

## 2018-10-26 DIAGNOSIS — M5106 Intervertebral disc disorders with myelopathy, lumbar region: Secondary | ICD-10-CM | POA: Diagnosis not present

## 2018-10-26 DIAGNOSIS — Z7982 Long term (current) use of aspirin: Secondary | ICD-10-CM | POA: Diagnosis not present

## 2018-10-26 DIAGNOSIS — M069 Rheumatoid arthritis, unspecified: Secondary | ICD-10-CM | POA: Diagnosis not present

## 2018-10-26 DIAGNOSIS — M797 Fibromyalgia: Secondary | ICD-10-CM | POA: Diagnosis not present

## 2018-10-26 DIAGNOSIS — E785 Hyperlipidemia, unspecified: Secondary | ICD-10-CM | POA: Diagnosis not present

## 2018-10-26 DIAGNOSIS — M1711 Unilateral primary osteoarthritis, right knee: Secondary | ICD-10-CM | POA: Diagnosis not present

## 2018-10-26 DIAGNOSIS — Z9181 History of falling: Secondary | ICD-10-CM | POA: Diagnosis not present

## 2018-10-26 DIAGNOSIS — I2510 Atherosclerotic heart disease of native coronary artery without angina pectoris: Secondary | ICD-10-CM | POA: Diagnosis not present

## 2018-10-26 DIAGNOSIS — Z7984 Long term (current) use of oral hypoglycemic drugs: Secondary | ICD-10-CM | POA: Diagnosis not present

## 2018-10-26 DIAGNOSIS — G4730 Sleep apnea, unspecified: Secondary | ICD-10-CM | POA: Diagnosis not present

## 2018-10-26 DIAGNOSIS — K219 Gastro-esophageal reflux disease without esophagitis: Secondary | ICD-10-CM | POA: Diagnosis not present

## 2018-10-26 DIAGNOSIS — D329 Benign neoplasm of meninges, unspecified: Secondary | ICD-10-CM | POA: Diagnosis not present

## 2018-10-26 DIAGNOSIS — I10 Essential (primary) hypertension: Secondary | ICD-10-CM | POA: Diagnosis not present

## 2018-10-26 DIAGNOSIS — E119 Type 2 diabetes mellitus without complications: Secondary | ICD-10-CM | POA: Diagnosis not present

## 2018-10-26 DIAGNOSIS — Z96652 Presence of left artificial knee joint: Secondary | ICD-10-CM | POA: Diagnosis not present

## 2018-10-27 ENCOUNTER — Telehealth: Payer: Self-pay | Admitting: Family Medicine

## 2018-10-27 DIAGNOSIS — I10 Essential (primary) hypertension: Secondary | ICD-10-CM | POA: Diagnosis not present

## 2018-10-27 DIAGNOSIS — G4730 Sleep apnea, unspecified: Secondary | ICD-10-CM | POA: Diagnosis not present

## 2018-10-27 DIAGNOSIS — M069 Rheumatoid arthritis, unspecified: Secondary | ICD-10-CM | POA: Diagnosis not present

## 2018-10-27 DIAGNOSIS — E785 Hyperlipidemia, unspecified: Secondary | ICD-10-CM | POA: Diagnosis not present

## 2018-10-27 DIAGNOSIS — K219 Gastro-esophageal reflux disease without esophagitis: Secondary | ICD-10-CM | POA: Diagnosis not present

## 2018-10-27 DIAGNOSIS — M1711 Unilateral primary osteoarthritis, right knee: Secondary | ICD-10-CM | POA: Diagnosis not present

## 2018-10-27 DIAGNOSIS — E119 Type 2 diabetes mellitus without complications: Secondary | ICD-10-CM | POA: Diagnosis not present

## 2018-10-27 DIAGNOSIS — Z8673 Personal history of transient ischemic attack (TIA), and cerebral infarction without residual deficits: Secondary | ICD-10-CM | POA: Diagnosis not present

## 2018-10-27 DIAGNOSIS — Z7982 Long term (current) use of aspirin: Secondary | ICD-10-CM | POA: Diagnosis not present

## 2018-10-27 DIAGNOSIS — D329 Benign neoplasm of meninges, unspecified: Secondary | ICD-10-CM | POA: Diagnosis not present

## 2018-10-27 DIAGNOSIS — Z7984 Long term (current) use of oral hypoglycemic drugs: Secondary | ICD-10-CM | POA: Diagnosis not present

## 2018-10-27 DIAGNOSIS — M797 Fibromyalgia: Secondary | ICD-10-CM | POA: Diagnosis not present

## 2018-10-27 DIAGNOSIS — Z9181 History of falling: Secondary | ICD-10-CM | POA: Diagnosis not present

## 2018-10-27 DIAGNOSIS — M5106 Intervertebral disc disorders with myelopathy, lumbar region: Secondary | ICD-10-CM | POA: Diagnosis not present

## 2018-10-27 DIAGNOSIS — Z96652 Presence of left artificial knee joint: Secondary | ICD-10-CM | POA: Diagnosis not present

## 2018-10-27 DIAGNOSIS — I2510 Atherosclerotic heart disease of native coronary artery without angina pectoris: Secondary | ICD-10-CM | POA: Diagnosis not present

## 2018-10-27 NOTE — Telephone Encounter (Signed)
Please give verbal order.

## 2018-10-27 NOTE — Telephone Encounter (Signed)
Copied from [place] [phone]. Topic: Quick Communication - Home Health Verbal Orders >> Oct 27, 2018  3:49 PM Berneta Levins wrote: Caller/Agency: Di Kindle with [facility] Number: [phone], OK to leave a message Requesting OT/PT/Skilled Nursing/Social Work: speech therapy Frequency: 1x a week for 4 weeks - aticulation and oral motor skills

## 2018-10-29 DIAGNOSIS — Z96652 Presence of left artificial knee joint: Secondary | ICD-10-CM | POA: Diagnosis not present

## 2018-10-29 DIAGNOSIS — M797 Fibromyalgia: Secondary | ICD-10-CM | POA: Diagnosis not present

## 2018-10-29 DIAGNOSIS — K219 Gastro-esophageal reflux disease without esophagitis: Secondary | ICD-10-CM | POA: Diagnosis not present

## 2018-10-29 DIAGNOSIS — E785 Hyperlipidemia, unspecified: Secondary | ICD-10-CM | POA: Diagnosis not present

## 2018-10-29 DIAGNOSIS — Z7984 Long term (current) use of oral hypoglycemic drugs: Secondary | ICD-10-CM | POA: Diagnosis not present

## 2018-10-29 DIAGNOSIS — M1711 Unilateral primary osteoarthritis, right knee: Secondary | ICD-10-CM | POA: Diagnosis not present

## 2018-10-29 DIAGNOSIS — M069 Rheumatoid arthritis, unspecified: Secondary | ICD-10-CM | POA: Diagnosis not present

## 2018-10-29 DIAGNOSIS — Z9181 History of falling: Secondary | ICD-10-CM | POA: Diagnosis not present

## 2018-10-29 DIAGNOSIS — E119 Type 2 diabetes mellitus without complications: Secondary | ICD-10-CM | POA: Diagnosis not present

## 2018-10-29 DIAGNOSIS — Z7982 Long term (current) use of aspirin: Secondary | ICD-10-CM | POA: Diagnosis not present

## 2018-10-29 DIAGNOSIS — M5106 Intervertebral disc disorders with myelopathy, lumbar region: Secondary | ICD-10-CM | POA: Diagnosis not present

## 2018-10-29 DIAGNOSIS — I10 Essential (primary) hypertension: Secondary | ICD-10-CM | POA: Diagnosis not present

## 2018-10-29 DIAGNOSIS — G4730 Sleep apnea, unspecified: Secondary | ICD-10-CM | POA: Diagnosis not present

## 2018-10-29 DIAGNOSIS — D329 Benign neoplasm of meninges, unspecified: Secondary | ICD-10-CM | POA: Diagnosis not present

## 2018-10-29 DIAGNOSIS — I2510 Atherosclerotic heart disease of native coronary artery without angina pectoris: Secondary | ICD-10-CM | POA: Diagnosis not present

## 2018-10-29 DIAGNOSIS — Z8673 Personal history of transient ischemic attack (TIA), and cerebral infarction without residual deficits: Secondary | ICD-10-CM | POA: Diagnosis not present

## 2018-10-30 DIAGNOSIS — E785 Hyperlipidemia, unspecified: Secondary | ICD-10-CM | POA: Diagnosis not present

## 2018-10-30 DIAGNOSIS — I2510 Atherosclerotic heart disease of native coronary artery without angina pectoris: Secondary | ICD-10-CM | POA: Diagnosis not present

## 2018-10-30 DIAGNOSIS — G4730 Sleep apnea, unspecified: Secondary | ICD-10-CM | POA: Diagnosis not present

## 2018-10-30 DIAGNOSIS — M069 Rheumatoid arthritis, unspecified: Secondary | ICD-10-CM | POA: Diagnosis not present

## 2018-10-30 DIAGNOSIS — Z9181 History of falling: Secondary | ICD-10-CM | POA: Diagnosis not present

## 2018-10-30 DIAGNOSIS — Z96652 Presence of left artificial knee joint: Secondary | ICD-10-CM | POA: Diagnosis not present

## 2018-10-30 DIAGNOSIS — M5106 Intervertebral disc disorders with myelopathy, lumbar region: Secondary | ICD-10-CM | POA: Diagnosis not present

## 2018-10-30 DIAGNOSIS — M1711 Unilateral primary osteoarthritis, right knee: Secondary | ICD-10-CM | POA: Diagnosis not present

## 2018-10-30 DIAGNOSIS — Z7982 Long term (current) use of aspirin: Secondary | ICD-10-CM | POA: Diagnosis not present

## 2018-10-30 DIAGNOSIS — Z7984 Long term (current) use of oral hypoglycemic drugs: Secondary | ICD-10-CM | POA: Diagnosis not present

## 2018-10-30 DIAGNOSIS — K219 Gastro-esophageal reflux disease without esophagitis: Secondary | ICD-10-CM | POA: Diagnosis not present

## 2018-10-30 DIAGNOSIS — I10 Essential (primary) hypertension: Secondary | ICD-10-CM | POA: Diagnosis not present

## 2018-10-30 DIAGNOSIS — Z8673 Personal history of transient ischemic attack (TIA), and cerebral infarction without residual deficits: Secondary | ICD-10-CM | POA: Diagnosis not present

## 2018-10-30 DIAGNOSIS — E119 Type 2 diabetes mellitus without complications: Secondary | ICD-10-CM | POA: Diagnosis not present

## 2018-10-30 DIAGNOSIS — D329 Benign neoplasm of meninges, unspecified: Secondary | ICD-10-CM | POA: Diagnosis not present

## 2018-10-30 DIAGNOSIS — M797 Fibromyalgia: Secondary | ICD-10-CM | POA: Diagnosis not present

## 2018-11-02 DIAGNOSIS — Z7982 Long term (current) use of aspirin: Secondary | ICD-10-CM | POA: Diagnosis not present

## 2018-11-02 DIAGNOSIS — M797 Fibromyalgia: Secondary | ICD-10-CM | POA: Diagnosis not present

## 2018-11-02 DIAGNOSIS — G4730 Sleep apnea, unspecified: Secondary | ICD-10-CM | POA: Diagnosis not present

## 2018-11-02 DIAGNOSIS — K219 Gastro-esophageal reflux disease without esophagitis: Secondary | ICD-10-CM | POA: Diagnosis not present

## 2018-11-02 DIAGNOSIS — M5106 Intervertebral disc disorders with myelopathy, lumbar region: Secondary | ICD-10-CM | POA: Diagnosis not present

## 2018-11-02 DIAGNOSIS — E785 Hyperlipidemia, unspecified: Secondary | ICD-10-CM | POA: Diagnosis not present

## 2018-11-02 DIAGNOSIS — I2510 Atherosclerotic heart disease of native coronary artery without angina pectoris: Secondary | ICD-10-CM | POA: Diagnosis not present

## 2018-11-02 DIAGNOSIS — Z96652 Presence of left artificial knee joint: Secondary | ICD-10-CM | POA: Diagnosis not present

## 2018-11-02 DIAGNOSIS — M1711 Unilateral primary osteoarthritis, right knee: Secondary | ICD-10-CM | POA: Diagnosis not present

## 2018-11-02 DIAGNOSIS — Z7984 Long term (current) use of oral hypoglycemic drugs: Secondary | ICD-10-CM | POA: Diagnosis not present

## 2018-11-02 DIAGNOSIS — Z8673 Personal history of transient ischemic attack (TIA), and cerebral infarction without residual deficits: Secondary | ICD-10-CM | POA: Diagnosis not present

## 2018-11-02 DIAGNOSIS — I10 Essential (primary) hypertension: Secondary | ICD-10-CM | POA: Diagnosis not present

## 2018-11-02 DIAGNOSIS — E119 Type 2 diabetes mellitus without complications: Secondary | ICD-10-CM | POA: Diagnosis not present

## 2018-11-02 DIAGNOSIS — M069 Rheumatoid arthritis, unspecified: Secondary | ICD-10-CM | POA: Diagnosis not present

## 2018-11-02 DIAGNOSIS — Z9181 History of falling: Secondary | ICD-10-CM | POA: Diagnosis not present

## 2018-11-02 DIAGNOSIS — D329 Benign neoplasm of meninges, unspecified: Secondary | ICD-10-CM | POA: Diagnosis not present

## 2018-11-02 NOTE — Telephone Encounter (Signed)
Di Kindle called to f/up on request for v/o.  Please call to confirm. Callback Number: [phone]

## 2018-11-02 NOTE — Telephone Encounter (Signed)
Left a voicemail Di Kindle with [facility] regarding Dr. Ancil Boozer verbal approval for Speech Therapy.

## 2018-11-03 DIAGNOSIS — M069 Rheumatoid arthritis, unspecified: Secondary | ICD-10-CM | POA: Diagnosis not present

## 2018-11-03 DIAGNOSIS — I2510 Atherosclerotic heart disease of native coronary artery without angina pectoris: Secondary | ICD-10-CM | POA: Diagnosis not present

## 2018-11-03 DIAGNOSIS — M1711 Unilateral primary osteoarthritis, right knee: Secondary | ICD-10-CM | POA: Diagnosis not present

## 2018-11-03 DIAGNOSIS — E785 Hyperlipidemia, unspecified: Secondary | ICD-10-CM | POA: Diagnosis not present

## 2018-11-03 DIAGNOSIS — I10 Essential (primary) hypertension: Secondary | ICD-10-CM | POA: Diagnosis not present

## 2018-11-03 DIAGNOSIS — Z9181 History of falling: Secondary | ICD-10-CM | POA: Diagnosis not present

## 2018-11-03 DIAGNOSIS — E119 Type 2 diabetes mellitus without complications: Secondary | ICD-10-CM | POA: Diagnosis not present

## 2018-11-03 DIAGNOSIS — D329 Benign neoplasm of meninges, unspecified: Secondary | ICD-10-CM | POA: Diagnosis not present

## 2018-11-03 DIAGNOSIS — Z7984 Long term (current) use of oral hypoglycemic drugs: Secondary | ICD-10-CM | POA: Diagnosis not present

## 2018-11-03 DIAGNOSIS — Z8673 Personal history of transient ischemic attack (TIA), and cerebral infarction without residual deficits: Secondary | ICD-10-CM | POA: Diagnosis not present

## 2018-11-03 DIAGNOSIS — M797 Fibromyalgia: Secondary | ICD-10-CM | POA: Diagnosis not present

## 2018-11-03 DIAGNOSIS — M5106 Intervertebral disc disorders with myelopathy, lumbar region: Secondary | ICD-10-CM | POA: Diagnosis not present

## 2018-11-03 DIAGNOSIS — K219 Gastro-esophageal reflux disease without esophagitis: Secondary | ICD-10-CM | POA: Diagnosis not present

## 2018-11-03 DIAGNOSIS — G4730 Sleep apnea, unspecified: Secondary | ICD-10-CM | POA: Diagnosis not present

## 2018-11-03 DIAGNOSIS — Z7982 Long term (current) use of aspirin: Secondary | ICD-10-CM | POA: Diagnosis not present

## 2018-11-03 DIAGNOSIS — Z96652 Presence of left artificial knee joint: Secondary | ICD-10-CM | POA: Diagnosis not present

## 2018-11-04 ENCOUNTER — Other Ambulatory Visit: Payer: Self-pay

## 2018-11-04 DIAGNOSIS — E119 Type 2 diabetes mellitus without complications: Secondary | ICD-10-CM

## 2018-11-04 DIAGNOSIS — I10 Essential (primary) hypertension: Secondary | ICD-10-CM

## 2018-11-04 MED ORDER — ATORVASTATIN CALCIUM 40 MG PO TABS: 40.0000 mg | ORAL_TABLET | Freq: Every day | ORAL | 0 refills | Status: DC

## 2018-11-04 MED ORDER — LISINOPRIL 20 MG PO TABS: 20.0000 mg | ORAL_TABLET | Freq: Every day | ORAL | 0 refills | Status: DC

## 2018-11-04 MED ORDER — HYDROCHLOROTHIAZIDE 25 MG PO TABS: 25.0000 mg | ORAL_TABLET | Freq: Every day | ORAL | 0 refills | Status: DC

## 2018-11-04 MED ORDER — METFORMIN HCL 500 MG PO TABS: 500.0000 mg | ORAL_TABLET | Freq: Two times a day (BID) | ORAL | 0 refills | Status: DC

## 2018-11-04 NOTE — Telephone Encounter (Signed)
Refill request for Hypertension medication:  Lisinopril 20 mg HCTZ 25 mg  Last office visit pertaining to hypertension: 05/12/2018  BP Readings from Last 3 Encounters:  10/22/18 122/78  09/28/18 134/82  09/10/18 134/86     Lab Results  Component Value Date   CREATININE 0.76 09/10/2018   BUN 15 09/10/2018   NA 140 09/10/2018   K 3.5 09/10/2018   CL 106 09/10/2018   CO2 25 09/10/2018   Follow-ups on file. 12/02/2018

## 2018-11-05 ENCOUNTER — Ambulatory Visit: Payer: Medicare Other

## 2018-11-05 DIAGNOSIS — I10 Essential (primary) hypertension: Secondary | ICD-10-CM

## 2018-11-05 DIAGNOSIS — F200 Paranoid schizophrenia: Secondary | ICD-10-CM

## 2018-11-05 DIAGNOSIS — R569 Unspecified convulsions: Secondary | ICD-10-CM

## 2018-11-05 NOTE — Chronic Care Management (AMB) (Signed)
  Chronic Care Management   Follow Up Note   11/05/2018 Name: Lauren [name] MRN: [id number] DOB: [birth year]  Referred by: Steele Sizer, MD Reason for referral : Chronic Care Management (telephone follow up)   Subjective: "I don't think I can go back to work because my tremors are so bad"   Objective:  Assessment:  Lauren Byrd Watlingtonis a 68 y.o.year old femalepatient of Dr. Steele Sizer, MD.Dr. [facility] the CCM team to consult for assistance with chronic disease management related to community resources.LaurenWatlingtonagreedthat CCM services would be helpful toher. She was seen in the office12/10/19by the CCM Team to establish healthcare goals and has been followed since. Today I followed up with Lauren [name] again to see if she would like to establish new goals as previous nursing goals have been met.  Lauren [name] remains engaged with [facility] PT, OT, Speech, and Social Work to assist with self care. She continues to be provided support by neurology, psychiatry, and primary care. Lauren [name] is concerned that she will not be able to return to work related to her tremors. She is working with Social Work related to this issue. States her tremors are somewhat controlled until she is stressed. Her speech is affected and speech therapy is working with her.  Lauren [name] has met his clinical nursing goals. Review of patient status, including review of consultants reports, relevant laboratory and other test results, and collaboration with appropriate care team members and the patient's provider was performed as part of comprehensive patient evaluation and provision of chronic care management services. CCM RN Case Manager is available to Lauren [name] at any time to assist with care management needs should they arise. Mr. has been given contact information and instructions to contact RN Case Manager twith any questions or should new care management needs arise.       Kron Everton E. Rollene Rotunda, RN, BSN Nurse Care Coordinator [facility] Care Management  [phone]

## 2018-11-06 DIAGNOSIS — Z9181 History of falling: Secondary | ICD-10-CM | POA: Diagnosis not present

## 2018-11-06 DIAGNOSIS — Z8673 Personal history of transient ischemic attack (TIA), and cerebral infarction without residual deficits: Secondary | ICD-10-CM | POA: Diagnosis not present

## 2018-11-06 DIAGNOSIS — M1711 Unilateral primary osteoarthritis, right knee: Secondary | ICD-10-CM | POA: Diagnosis not present

## 2018-11-06 DIAGNOSIS — Z96652 Presence of left artificial knee joint: Secondary | ICD-10-CM | POA: Diagnosis not present

## 2018-11-06 DIAGNOSIS — G4730 Sleep apnea, unspecified: Secondary | ICD-10-CM | POA: Diagnosis not present

## 2018-11-06 DIAGNOSIS — E119 Type 2 diabetes mellitus without complications: Secondary | ICD-10-CM | POA: Diagnosis not present

## 2018-11-06 DIAGNOSIS — Z7982 Long term (current) use of aspirin: Secondary | ICD-10-CM | POA: Diagnosis not present

## 2018-11-06 DIAGNOSIS — D329 Benign neoplasm of meninges, unspecified: Secondary | ICD-10-CM | POA: Diagnosis not present

## 2018-11-06 DIAGNOSIS — I2510 Atherosclerotic heart disease of native coronary artery without angina pectoris: Secondary | ICD-10-CM | POA: Diagnosis not present

## 2018-11-06 DIAGNOSIS — I10 Essential (primary) hypertension: Secondary | ICD-10-CM | POA: Diagnosis not present

## 2018-11-06 DIAGNOSIS — E785 Hyperlipidemia, unspecified: Secondary | ICD-10-CM | POA: Diagnosis not present

## 2018-11-06 DIAGNOSIS — M069 Rheumatoid arthritis, unspecified: Secondary | ICD-10-CM | POA: Diagnosis not present

## 2018-11-06 DIAGNOSIS — M5106 Intervertebral disc disorders with myelopathy, lumbar region: Secondary | ICD-10-CM | POA: Diagnosis not present

## 2018-11-06 DIAGNOSIS — Z7984 Long term (current) use of oral hypoglycemic drugs: Secondary | ICD-10-CM | POA: Diagnosis not present

## 2018-11-06 DIAGNOSIS — M797 Fibromyalgia: Secondary | ICD-10-CM | POA: Diagnosis not present

## 2018-11-06 DIAGNOSIS — K219 Gastro-esophageal reflux disease without esophagitis: Secondary | ICD-10-CM | POA: Diagnosis not present

## 2018-11-06 NOTE — Patient Instructions (Signed)
  Thank you allowing the Chronic Care Management Team to be a part of your care! It was a pleasure speaking with you today!  Congratulations! You have met all case management goals! You may call the case management team at any time should you have a question or if you have new case management needs. We are happy to help you! We will let your doctor know that you have met your goals.   CCM (Chronic Care Management) Team   Johndavid Geralds RN, BSN Nurse Care Coordinator  [phone]  Julie Hedrick PharmD  Clinical Pharmacist  [phone]    

## 2018-11-10 DIAGNOSIS — D329 Benign neoplasm of meninges, unspecified: Secondary | ICD-10-CM | POA: Diagnosis not present

## 2018-11-11 ENCOUNTER — Telehealth: Payer: Self-pay | Admitting: Family Medicine

## 2018-11-11 DIAGNOSIS — Z9181 History of falling: Secondary | ICD-10-CM | POA: Diagnosis not present

## 2018-11-11 DIAGNOSIS — Z7984 Long term (current) use of oral hypoglycemic drugs: Secondary | ICD-10-CM | POA: Diagnosis not present

## 2018-11-11 DIAGNOSIS — Z7982 Long term (current) use of aspirin: Secondary | ICD-10-CM | POA: Diagnosis not present

## 2018-11-11 DIAGNOSIS — E785 Hyperlipidemia, unspecified: Secondary | ICD-10-CM | POA: Diagnosis not present

## 2018-11-11 DIAGNOSIS — Z96652 Presence of left artificial knee joint: Secondary | ICD-10-CM | POA: Diagnosis not present

## 2018-11-11 DIAGNOSIS — E119 Type 2 diabetes mellitus without complications: Secondary | ICD-10-CM | POA: Diagnosis not present

## 2018-11-11 DIAGNOSIS — G4730 Sleep apnea, unspecified: Secondary | ICD-10-CM | POA: Diagnosis not present

## 2018-11-11 DIAGNOSIS — I10 Essential (primary) hypertension: Secondary | ICD-10-CM | POA: Diagnosis not present

## 2018-11-11 DIAGNOSIS — M797 Fibromyalgia: Secondary | ICD-10-CM | POA: Diagnosis not present

## 2018-11-11 DIAGNOSIS — I2510 Atherosclerotic heart disease of native coronary artery without angina pectoris: Secondary | ICD-10-CM | POA: Diagnosis not present

## 2018-11-11 DIAGNOSIS — M069 Rheumatoid arthritis, unspecified: Secondary | ICD-10-CM | POA: Diagnosis not present

## 2018-11-11 DIAGNOSIS — M5106 Intervertebral disc disorders with myelopathy, lumbar region: Secondary | ICD-10-CM | POA: Diagnosis not present

## 2018-11-11 DIAGNOSIS — M1711 Unilateral primary osteoarthritis, right knee: Secondary | ICD-10-CM | POA: Diagnosis not present

## 2018-11-11 DIAGNOSIS — K219 Gastro-esophageal reflux disease without esophagitis: Secondary | ICD-10-CM | POA: Diagnosis not present

## 2018-11-11 DIAGNOSIS — Z8673 Personal history of transient ischemic attack (TIA), and cerebral infarction without residual deficits: Secondary | ICD-10-CM | POA: Diagnosis not present

## 2018-11-11 DIAGNOSIS — D329 Benign neoplasm of meninges, unspecified: Secondary | ICD-10-CM | POA: Diagnosis not present
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
## 2018-11-11 NOTE — Telephone Encounter (Signed)
We will need to discuss it with her when she is here for follow up

## 2018-11-13 ENCOUNTER — Telehealth: Payer: Self-pay | Admitting: *Deleted

## 2018-11-13 DIAGNOSIS — Z7984 Long term (current) use of oral hypoglycemic drugs: Secondary | ICD-10-CM | POA: Diagnosis not present

## 2018-11-13 DIAGNOSIS — G4730 Sleep apnea, unspecified: Secondary | ICD-10-CM | POA: Diagnosis not present

## 2018-11-13 DIAGNOSIS — Z8673 Personal history of transient ischemic attack (TIA), and cerebral infarction without residual deficits: Secondary | ICD-10-CM | POA: Diagnosis not present

## 2018-11-13 DIAGNOSIS — M797 Fibromyalgia: Secondary | ICD-10-CM | POA: Diagnosis not present

## 2018-11-13 DIAGNOSIS — E119 Type 2 diabetes mellitus without complications: Secondary | ICD-10-CM | POA: Diagnosis not present

## 2018-11-13 DIAGNOSIS — Z96652 Presence of left artificial knee joint: Secondary | ICD-10-CM | POA: Diagnosis not present

## 2018-11-13 DIAGNOSIS — K219 Gastro-esophageal reflux disease without esophagitis: Secondary | ICD-10-CM | POA: Diagnosis not present

## 2018-11-13 DIAGNOSIS — I10 Essential (primary) hypertension: Secondary | ICD-10-CM | POA: Diagnosis not present

## 2018-11-13 DIAGNOSIS — I2510 Atherosclerotic heart disease of native coronary artery without angina pectoris: Secondary | ICD-10-CM | POA: Diagnosis not present

## 2018-11-13 DIAGNOSIS — M5106 Intervertebral disc disorders with myelopathy, lumbar region: Secondary | ICD-10-CM | POA: Diagnosis not present

## 2018-11-13 DIAGNOSIS — Z7982 Long term (current) use of aspirin: Secondary | ICD-10-CM | POA: Diagnosis not present

## 2018-11-13 DIAGNOSIS — Z9181 History of falling: Secondary | ICD-10-CM | POA: Diagnosis not present

## 2018-11-13 DIAGNOSIS — D329 Benign neoplasm of meninges, unspecified: Secondary | ICD-10-CM | POA: Diagnosis not present

## 2018-11-13 DIAGNOSIS — M069 Rheumatoid arthritis, unspecified: Secondary | ICD-10-CM | POA: Diagnosis not present

## 2018-11-13 DIAGNOSIS — M1711 Unilateral primary osteoarthritis, right knee: Secondary | ICD-10-CM | POA: Diagnosis not present

## 2018-11-13 DIAGNOSIS — E785 Hyperlipidemia, unspecified: Secondary | ICD-10-CM | POA: Diagnosis not present

## 2018-11-13 NOTE — Telephone Encounter (Signed)
Spoke with Di Kindle with [facility] and informed her per Dr. Ancil Boozer that her psychiatrist prescribed these medication and she is has been on them and stable.

## 2018-11-13 NOTE — Telephone Encounter (Signed)
She is under the care of psychiatrist and has been stable on mediation , continue both

## 2018-11-13 NOTE — Telephone Encounter (Signed)
Speech therapist is calling to report Red flag warning on medication interaction:  Haldol decanoate injection and Trazodone tablet ( per patient these are not new medications for her)

## 2018-11-16 DIAGNOSIS — D329 Benign neoplasm of meninges, unspecified: Secondary | ICD-10-CM | POA: Diagnosis not present

## 2018-11-16 DIAGNOSIS — E785 Hyperlipidemia, unspecified: Secondary | ICD-10-CM | POA: Diagnosis not present

## 2018-11-16 DIAGNOSIS — Z96652 Presence of left artificial knee joint: Secondary | ICD-10-CM | POA: Diagnosis not present

## 2018-11-16 DIAGNOSIS — Z9181 History of falling: Secondary | ICD-10-CM | POA: Diagnosis not present

## 2018-11-16 DIAGNOSIS — E119 Type 2 diabetes mellitus without complications: Secondary | ICD-10-CM | POA: Diagnosis not present

## 2018-11-16 DIAGNOSIS — M5106 Intervertebral disc disorders with myelopathy, lumbar region: Secondary | ICD-10-CM | POA: Diagnosis not present

## 2018-11-16 DIAGNOSIS — Z7982 Long term (current) use of aspirin: Secondary | ICD-10-CM | POA: Diagnosis not present

## 2018-11-16 DIAGNOSIS — I2510 Atherosclerotic heart disease of native coronary artery without angina pectoris: Secondary | ICD-10-CM | POA: Diagnosis not present

## 2018-11-16 DIAGNOSIS — Z7984 Long term (current) use of oral hypoglycemic drugs: Secondary | ICD-10-CM | POA: Diagnosis not present

## 2018-11-16 DIAGNOSIS — M1711 Unilateral primary osteoarthritis, right knee: Secondary | ICD-10-CM | POA: Diagnosis not present

## 2018-11-16 DIAGNOSIS — Z8673 Personal history of transient ischemic attack (TIA), and cerebral infarction without residual deficits: Secondary | ICD-10-CM | POA: Diagnosis not present

## 2018-11-16 DIAGNOSIS — M069 Rheumatoid arthritis, unspecified: Secondary | ICD-10-CM | POA: Diagnosis not present

## 2018-11-16 DIAGNOSIS — K219 Gastro-esophageal reflux disease without esophagitis: Secondary | ICD-10-CM | POA: Diagnosis not present

## 2018-11-16 DIAGNOSIS — I10 Essential (primary) hypertension: Secondary | ICD-10-CM | POA: Diagnosis not present

## 2018-11-16 DIAGNOSIS — G4733 Obstructive sleep apnea (adult) (pediatric): Secondary | ICD-10-CM | POA: Diagnosis not present

## 2018-11-16 DIAGNOSIS — M797 Fibromyalgia: Secondary | ICD-10-CM | POA: Diagnosis not present

## 2018-11-16 DIAGNOSIS — G4730 Sleep apnea, unspecified: Secondary | ICD-10-CM | POA: Diagnosis not present

## 2018-11-18 DIAGNOSIS — D329 Benign neoplasm of meninges, unspecified: Secondary | ICD-10-CM | POA: Diagnosis not present

## 2018-11-18 DIAGNOSIS — M797 Fibromyalgia: Secondary | ICD-10-CM | POA: Diagnosis not present

## 2018-11-18 DIAGNOSIS — M1711 Unilateral primary osteoarthritis, right knee: Secondary | ICD-10-CM | POA: Diagnosis not present

## 2018-11-18 DIAGNOSIS — Z7982 Long term (current) use of aspirin: Secondary | ICD-10-CM | POA: Diagnosis not present

## 2018-11-18 DIAGNOSIS — Z96652 Presence of left artificial knee joint: Secondary | ICD-10-CM | POA: Diagnosis not present

## 2018-11-18 DIAGNOSIS — K219 Gastro-esophageal reflux disease without esophagitis: Secondary | ICD-10-CM | POA: Diagnosis not present

## 2018-11-18 DIAGNOSIS — M069 Rheumatoid arthritis, unspecified: Secondary | ICD-10-CM | POA: Diagnosis not present

## 2018-11-18 DIAGNOSIS — Z8673 Personal history of transient ischemic attack (TIA), and cerebral infarction without residual deficits: Secondary | ICD-10-CM | POA: Diagnosis not present

## 2018-11-18 DIAGNOSIS — I10 Essential (primary) hypertension: Secondary | ICD-10-CM | POA: Diagnosis not present

## 2018-11-18 DIAGNOSIS — I2510 Atherosclerotic heart disease of native coronary artery without angina pectoris: Secondary | ICD-10-CM | POA: Diagnosis not present

## 2018-11-18 DIAGNOSIS — Z7984 Long term (current) use of oral hypoglycemic drugs: Secondary | ICD-10-CM | POA: Diagnosis not present

## 2018-11-18 DIAGNOSIS — E119 Type 2 diabetes mellitus without complications: Secondary | ICD-10-CM | POA: Diagnosis not present

## 2018-11-18 DIAGNOSIS — M5106 Intervertebral disc disorders with myelopathy, lumbar region: Secondary | ICD-10-CM | POA: Diagnosis not present

## 2018-11-18 DIAGNOSIS — G4730 Sleep apnea, unspecified: Secondary | ICD-10-CM | POA: Diagnosis not present

## 2018-11-18 DIAGNOSIS — E785 Hyperlipidemia, unspecified: Secondary | ICD-10-CM | POA: Diagnosis not present

## 2018-11-18 DIAGNOSIS — Z9181 History of falling: Secondary | ICD-10-CM | POA: Diagnosis not present

## 2018-11-22 ENCOUNTER — Observation Stay: Payer: Medicare Other

## 2018-11-22 ENCOUNTER — Emergency Department: Payer: Medicare Other

## 2018-11-22 ENCOUNTER — Inpatient Hospital Stay
Admission: EM | Admit: 2018-11-22 | Discharge: 2018-11-24 | DRG: 101 | Disposition: A | Discharge status: Home / Self Care | Payer: Medicare Other | Attending: Internal Medicine | Admitting: Internal Medicine

## 2018-11-22 ENCOUNTER — Other Ambulatory Visit: Payer: Self-pay

## 2018-11-22 DIAGNOSIS — E119 Type 2 diabetes mellitus without complications: Secondary | ICD-10-CM | POA: Diagnosis not present

## 2018-11-22 DIAGNOSIS — R682 Dry mouth, unspecified: Secondary | ICD-10-CM | POA: Diagnosis not present

## 2018-11-22 DIAGNOSIS — E1151 Type 2 diabetes mellitus with diabetic peripheral angiopathy without gangrene: Secondary | ICD-10-CM | POA: Diagnosis present

## 2018-11-22 DIAGNOSIS — W1830XA Fall on same level, unspecified, initial encounter: Secondary | ICD-10-CM | POA: Diagnosis present

## 2018-11-22 DIAGNOSIS — I69351 Hemiplegia and hemiparesis following cerebral infarction affecting right dominant side: Secondary | ICD-10-CM

## 2018-11-22 DIAGNOSIS — R531 Weakness: Secondary | ICD-10-CM | POA: Diagnosis not present

## 2018-11-22 DIAGNOSIS — Z923 Personal history of irradiation: Secondary | ICD-10-CM

## 2018-11-22 DIAGNOSIS — R7989 Other specified abnormal findings of blood chemistry: Secondary | ICD-10-CM | POA: Diagnosis not present

## 2018-11-22 DIAGNOSIS — Z800 Family history of malignant neoplasm of digestive organs: Secondary | ICD-10-CM

## 2018-11-22 DIAGNOSIS — I639 Cerebral infarction, unspecified: Secondary | ICD-10-CM

## 2018-11-22 DIAGNOSIS — Z823 Family history of stroke: Secondary | ICD-10-CM

## 2018-11-22 DIAGNOSIS — W19XXXA Unspecified fall, initial encounter: Secondary | ICD-10-CM | POA: Diagnosis not present

## 2018-11-22 DIAGNOSIS — K219 Gastro-esophageal reflux disease without esophagitis: Secondary | ICD-10-CM | POA: Diagnosis not present

## 2018-11-22 DIAGNOSIS — Z96652 Presence of left artificial knee joint: Secondary | ICD-10-CM | POA: Diagnosis present

## 2018-11-22 DIAGNOSIS — Z8249 Family history of ischemic heart disease and other diseases of the circulatory system: Secondary | ICD-10-CM

## 2018-11-22 DIAGNOSIS — Z801 Family history of malignant neoplasm of trachea, bronchus and lung: Secondary | ICD-10-CM | POA: Diagnosis not present

## 2018-11-22 DIAGNOSIS — Z79899 Other long term (current) drug therapy: Secondary | ICD-10-CM

## 2018-11-22 DIAGNOSIS — F259 Schizoaffective disorder, unspecified: Secondary | ICD-10-CM | POA: Diagnosis present

## 2018-11-22 DIAGNOSIS — R202 Paresthesia of skin: Secondary | ICD-10-CM | POA: Diagnosis present

## 2018-11-22 DIAGNOSIS — R4781 Slurred speech: Secondary | ICD-10-CM | POA: Diagnosis not present

## 2018-11-22 DIAGNOSIS — Z885 Allergy status to narcotic agent status: Secondary | ICD-10-CM

## 2018-11-22 DIAGNOSIS — Z7984 Long term (current) use of oral hypoglycemic drugs: Secondary | ICD-10-CM

## 2018-11-22 DIAGNOSIS — I10 Essential (primary) hypertension: Secondary | ICD-10-CM | POA: Diagnosis not present

## 2018-11-22 DIAGNOSIS — F419 Anxiety disorder, unspecified: Secondary | ICD-10-CM | POA: Diagnosis present

## 2018-11-22 DIAGNOSIS — R296 Repeated falls: Secondary | ICD-10-CM | POA: Diagnosis present

## 2018-11-22 DIAGNOSIS — E785 Hyperlipidemia, unspecified: Secondary | ICD-10-CM | POA: Diagnosis present

## 2018-11-22 DIAGNOSIS — Z841 Family history of disorders of kidney and ureter: Secondary | ICD-10-CM

## 2018-11-22 DIAGNOSIS — M069 Rheumatoid arthritis, unspecified: Secondary | ICD-10-CM | POA: Diagnosis present

## 2018-11-22 DIAGNOSIS — M50223 Other cervical disc displacement at C6-C7 level: Secondary | ICD-10-CM | POA: Diagnosis not present

## 2018-11-22 DIAGNOSIS — I6389 Other cerebral infarction: Secondary | ICD-10-CM | POA: Diagnosis not present

## 2018-11-22 DIAGNOSIS — M797 Fibromyalgia: Secondary | ICD-10-CM | POA: Diagnosis present

## 2018-11-22 DIAGNOSIS — R569 Unspecified convulsions: Principal | ICD-10-CM

## 2018-11-22 DIAGNOSIS — Z833 Family history of diabetes mellitus: Secondary | ICD-10-CM | POA: Diagnosis not present

## 2018-11-22 DIAGNOSIS — G4733 Obstructive sleep apnea (adult) (pediatric): Secondary | ICD-10-CM | POA: Diagnosis present

## 2018-11-22 DIAGNOSIS — D329 Benign neoplasm of meninges, unspecified: Secondary | ICD-10-CM | POA: Diagnosis not present

## 2018-11-22 DIAGNOSIS — R778 Other specified abnormalities of plasma proteins: Secondary | ICD-10-CM

## 2018-11-22 DIAGNOSIS — Z818 Family history of other mental and behavioral disorders: Secondary | ICD-10-CM

## 2018-11-22 DIAGNOSIS — Z86011 Personal history of benign neoplasm of the brain: Secondary | ICD-10-CM

## 2018-11-22 DIAGNOSIS — I69322 Dysarthria following cerebral infarction: Secondary | ICD-10-CM

## 2018-11-22 DIAGNOSIS — Z7982 Long term (current) use of aspirin: Secondary | ICD-10-CM

## 2018-11-22 DIAGNOSIS — Z825 Family history of asthma and other chronic lower respiratory diseases: Secondary | ICD-10-CM

## 2018-11-22 DIAGNOSIS — Z6841 Body Mass Index (BMI) 40.0 and over, adult: Secondary | ICD-10-CM

## 2018-11-22 DIAGNOSIS — M4302 Spondylolysis, cervical region: Secondary | ICD-10-CM | POA: Diagnosis not present

## 2018-11-22 DIAGNOSIS — Z811 Family history of alcohol abuse and dependence: Secondary | ICD-10-CM

## 2018-11-22 DIAGNOSIS — I63233 Cerebral infarction due to unspecified occlusion or stenosis of bilateral carotid arteries: Secondary | ICD-10-CM | POA: Diagnosis not present

## 2018-11-22 DIAGNOSIS — Z66 Do not resuscitate: Secondary | ICD-10-CM | POA: Diagnosis present

## 2018-11-22 DIAGNOSIS — S0990XA Unspecified injury of head, initial encounter: Secondary | ICD-10-CM | POA: Diagnosis not present

## 2018-11-22 HISTORY — DX: Cerebral infarction, unspecified: I63.9

## 2018-11-22 LAB — COMPREHENSIVE METABOLIC PANEL
ALT: 17 U/L (ref 0–44)
AST: 26 U/L (ref 15–41)
Albumin: 3.9 g/dL (ref 3.5–5.0)
Alkaline Phosphatase: 65 U/L (ref 38–126)
Anion gap: 10 (ref 5–15)
BUN: 12 mg/dL (ref 8–23)
CO2: 24 mmol/L (ref 22–32)
Calcium: 9.2 mg/dL (ref 8.9–10.3)
Chloride: 105 mmol/L (ref 98–111)
Creatinine, Ser: 0.74 mg/dL (ref 0.44–1.00)
GFR calc Af Amer: >60 mL/min (ref >60)
GFR calc non Af Amer: >60 mL/min (ref >60)
Glucose, Bld: 130 mg/dL — ABNORMAL HIGH (ref 70–99)
Potassium: 3.5 mmol/L (ref 3.5–5.1)
Sodium: 139 mmol/L (ref 135–145)
Total Bilirubin: 0.6 mg/dL (ref 0.3–1.2)
Total Protein: 7.1 g/dL (ref 6.5–8.1)

## 2018-11-22 LAB — CBC WITH DIFFERENTIAL/PLATELET
Abs Immature Granulocytes: 0.02 10*3/uL (ref 0.00–0.07)
Basophils Absolute: 0 10*3/uL (ref 0.0–0.1)
Basophils Relative: 1 %
Eosinophils Absolute: 0.1 10*3/uL (ref 0.0–0.5)
Eosinophils Relative: 2 %
HCT: 37.3 % (ref 36.0–46.0)
Hemoglobin: 12.5 g/dL (ref 12.0–15.0)
Immature Granulocytes: 0 %
Lymphocytes Relative: 29 %
Lymphs Abs: 1.4 10*3/uL (ref 0.7–4.0)
MCH: 31.3 pg (ref 26.0–34.0)
MCHC: 33.5 g/dL (ref 30.0–36.0)
MCV: 93.5 fL (ref 80.0–100.0)
Monocytes Absolute: 0.4 10*3/uL (ref 0.1–1.0)
Monocytes Relative: 7 %
Neutro Abs: 3 10*3/uL (ref 1.7–7.7)
Neutrophils Relative %: 61 %
Platelets: 246 10*3/uL (ref 150–400)
RBC: 3.99 MIL/uL (ref 3.87–5.11)
RDW: 13.4 % (ref 11.5–15.5)
WBC: 4.8 10*3/uL (ref 4.0–10.5)
nRBC: 0 % (ref 0.0–0.2)

## 2018-11-22 LAB — GLUCOSE, CAPILLARY
Glucose-Capillary: 83 mg/dL (ref 70–99)
Glucose-Capillary: 138 mg/dL — ABNORMAL HIGH (ref 70–99)

## 2018-11-22 LAB — TROPONIN I
Troponin I: 0.07 ng/mL (ref <0.03)
Troponin I: 0.09 ng/mL (ref <0.03)

## 2018-11-22 MED ORDER — INSULIN ASPART 100 UNIT/ML ~~LOC~~ SOLN: 0.0000 [IU] | Freq: Every day | SUBCUTANEOUS | Status: DC

## 2018-11-22 MED ORDER — VITAMIN C 500 MG PO TABS: 500.0000 mg | ORAL_TABLET | Freq: Every day | ORAL | Status: DC | PRN

## 2018-11-22 MED ORDER — BIOTENE DRY MOUTH MT LIQD
15.0000 mL | Freq: Two times a day (BID) | OROMUCOSAL | Status: DC
  Administered 2018-11-22 – 2018-11-24 (×4): 15 mL via OROMUCOSAL

## 2018-11-22 MED ORDER — STROKE: EARLY STAGES OF RECOVERY BOOK
Freq: Once | Status: AC
  Administered 2018-11-22: 17:00:00

## 2018-11-22 MED ORDER — ACETAMINOPHEN 325 MG PO TABS: 650.0000 mg | ORAL_TABLET | ORAL | Status: DC | PRN

## 2018-11-22 MED ORDER — ACETAMINOPHEN 160 MG/5ML PO SOLN
650.0000 mg | ORAL | Status: DC | PRN
  Filled 2018-11-22: qty 20.3

## 2018-11-22 MED ORDER — ACETAMINOPHEN 650 MG RE SUPP: 650.0000 mg | RECTAL | Status: DC | PRN

## 2018-11-22 MED ORDER — INSULIN ASPART 100 UNIT/ML ~~LOC~~ SOLN: 0.0000 [IU] | Freq: Three times a day (TID) | SUBCUTANEOUS | Status: DC

## 2018-11-22 MED ORDER — BENZTROPINE MESYLATE 1 MG PO TABS
1.0000 mg | ORAL_TABLET | Freq: Two times a day (BID) | ORAL | Status: DC
  Administered 2018-11-22 – 2018-11-24 (×4): 1 mg via ORAL
  Filled 2018-11-22 (×5): qty 1

## 2018-11-22 MED ORDER — LORAZEPAM 2 MG/ML IJ SOLN: 1.0000 mg | INTRAMUSCULAR | Status: DC | PRN

## 2018-11-22 MED ORDER — HYDROCHLOROTHIAZIDE 25 MG PO TABS
25.0000 mg | ORAL_TABLET | Freq: Every day | ORAL | Status: DC
  Administered 2018-11-23 – 2018-11-24 (×2): 25 mg via ORAL
  Filled 2018-11-22 (×2): qty 1

## 2018-11-22 MED ORDER — ESCITALOPRAM OXALATE 10 MG PO TABS
10.0000 mg | ORAL_TABLET | Freq: Every day | ORAL | Status: DC
  Administered 2018-11-23 – 2018-11-24 (×2): 10 mg via ORAL
  Filled 2018-11-22 (×2): qty 1

## 2018-11-22 MED ORDER — ATORVASTATIN CALCIUM 20 MG PO TABS
40.0000 mg | ORAL_TABLET | Freq: Every day | ORAL | Status: DC
  Administered 2018-11-22 – 2018-11-23 (×2): 40 mg via ORAL
  Filled 2018-11-22 (×2): qty 2

## 2018-11-22 MED ORDER — ASPIRIN 300 MG RE SUPP: 300.0000 mg | Freq: Every day | RECTAL | Status: DC

## 2018-11-22 MED ORDER — GABAPENTIN 100 MG PO CAPS
100.0000 mg | ORAL_CAPSULE | Freq: Two times a day (BID) | ORAL | Status: DC
  Administered 2018-11-22: 22:00:00 100 mg via ORAL
  Filled 2018-11-22: qty 1

## 2018-11-22 MED ORDER — ENOXAPARIN SODIUM 40 MG/0.4ML ~~LOC~~ SOLN
40.0000 mg | Freq: Two times a day (BID) | SUBCUTANEOUS | Status: DC
  Administered 2018-11-22 – 2018-11-24 (×4): 40 mg via SUBCUTANEOUS
  Filled 2018-11-22 (×4): qty 0.4

## 2018-11-22 MED ORDER — BIOTENE DRY MOUTH GENTLE DT PSTE: 1.0000 "application " | PASTE | Freq: Two times a day (BID) | DENTAL | Status: DC

## 2018-11-22 MED ORDER — SODIUM CHLORIDE 0.9 % IV SOLN
INTRAVENOUS | Status: DC
  Administered 2018-11-22: 20:00:00 via INTRAVENOUS

## 2018-11-22 MED ORDER — TRAZODONE HCL 50 MG PO TABS
50.0000 mg | ORAL_TABLET | Freq: Every day | ORAL | Status: DC
  Administered 2018-11-22 – 2018-11-23 (×2): 50 mg via ORAL
  Filled 2018-11-22 (×2): qty 1

## 2018-11-22 MED ORDER — SENNOSIDES-DOCUSATE SODIUM 8.6-50 MG PO TABS: 1.0000 | ORAL_TABLET | Freq: Every evening | ORAL | Status: DC | PRN

## 2018-11-22 MED ORDER — PANTOPRAZOLE SODIUM 40 MG PO TBEC
40.0000 mg | DELAYED_RELEASE_TABLET | Freq: Every day | ORAL | Status: DC
  Administered 2018-11-23 – 2018-11-24 (×2): 40 mg via ORAL
  Filled 2018-11-22 (×2): qty 1

## 2018-11-22 MED ORDER — ASPIRIN EC 325 MG PO TBEC
325.0000 mg | DELAYED_RELEASE_TABLET | Freq: Every day | ORAL | Status: DC
  Administered 2018-11-23 – 2018-11-24 (×2): 325 mg via ORAL
  Filled 2018-11-22 (×2): qty 1

## 2018-11-22 MED ORDER — LISINOPRIL 20 MG PO TABS
20.0000 mg | ORAL_TABLET | Freq: Every day | ORAL | Status: DC
  Administered 2018-11-23 – 2018-11-24 (×2): 20 mg via ORAL
  Filled 2018-11-22 (×2): qty 1

## 2018-11-22 MED ORDER — KNEE COMPRESSION SLEEVE/L/XL MISC: 2.0000 | Status: DC | PRN

## 2018-11-22 NOTE — ED Notes (Addendum)
ED TO INPATIENT HANDOFF REPORT  ED Nurse Name and Phone #: Karena Addison [phone]  S Name/Age/Gender Lauren [name] 68 y.o. female Room/Bed: ED26A/ED26A  Code Status   Code Status: Prior  Home/SNF/Other Home Patient oriented to: self, place, time and situation Is this baseline? Yes   Triage Complete: Triage complete  Chief Complaint Fall  Triage Note Pt to ER via [facility] from home c/o fall last night from weakness. EMS came to house last night, refused treatment. Pt reports another episode of weakness and fell on floor in kitchen, no LOC. Hx of right sided weakness due to stroke. EDP at bedside.  CBG 144. HR 80 NSR, 97% on RA 168/125 BP reported.      Allergies Allergies  Allergen Reactions  . Percocet [Oxycodone-Acetaminophen] Diarrhea, Nausea And Vomiting and Nausea Only  . Tramadol Hcl Diarrhea, Nausea And Vomiting and Nausea Only  . Vicodin [Hydrocodone-Acetaminophen] Diarrhea, Nausea And Vomiting and Nausea Only    Level of Care/Admitting Diagnosis ED Disposition    ED Disposition Condition [facility]: [place] [[id number]]  Level of Care: Med-Surg [16]  Diagnosis: Paresthesias [676195]  Admitting Physician: Gorden Harms [[id number]]  Attending Physician: Gorden Harms [[id number]]  PT Class (Do Not Modify): Observation [104]  PT Acc Code (Do Not Modify): Observation [10022]       B Medical/Surgery History Past Medical History:  Diagnosis Date  . Anxiety   . Apnea, sleep 02/02/2014  . Awareness of heartbeats 02/02/2014  . Breathlessness on exertion 02/02/2014  . Diabetes mellitus   . Encounter for pre-employment examination 06/29/2015  . Excessive sweating 07/05/2015  . Fibromyalgia   . GERD (gastroesophageal reflux disease)   . Gravida 1 10/26/2015   1.    . Heart murmur   . Herniated disc   . Hyperlipidemia   . Hypertension   . Itch of skin 10/26/2015  . Lack of bladder control   . Lung tumor   .  Rheumatoid arthritis ([place])   . Schizoaffective disorder ([place])   . Screening for cervical cancer 07/29/2017  . Seizure [facility])    after brain surgery 2012. last seizure 2013!  [name] Sex counseling 10/26/2015  . Sleep apnea   . 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 Stiffness of both knees 06/22/2015  . Stroke [facility])    Past Surgical History:  Procedure Laterality Date  . BRAIN TUMOR EXCISION  2012   benign  . CARDIAC CATHETERIZATION  2008  . CESAREAN SECTION    . CYST REMOVAL HAND    . LUNG LOBECTOMY  1977   benign tumor  . TOTAL KNEE ARTHROPLASTY Left 01/28/2017   Procedure: TOTAL KNEE ARTHROPLASTY;  Surgeon: Corky Mull, MD;  Location: [facility] ORS;  Service: Orthopedics;  Laterality: Left;     A IV Location/Drains/Wounds Patient Lines/Drains/Airways Status   Active Line/Drains/Airways    Name:   Placement date:   Placement time:   Site:   Days:   Peripheral IV 11/22/18 Left Forearm   11/22/18    1103    Forearm   less than 1   Incision (Closed) 01/28/17 Knee Left   01/28/17    0845     663          Intake/Output Last 24 hours No intake or output data in the 24 hours ending 11/22/18 1626  Labs/Imaging Results for orders placed or performed during the hospital encounter of 11/22/18 (from the past  48 hour(s))  CBC with Differential     Status: None   Collection Time: 11/22/18 10:59 AM  Result Value Ref Range   WBC 4.8 4.0 - 10.5 K/uL   RBC 3.99 3.87 - 5.11 MIL/uL   Hemoglobin 12.5 12.0 - 15.0 g/dL   HCT 37.3 36.0 - 46.0 %   MCV 93.5 80.0 - 100.0 fL   MCH 31.3 26.0 - 34.0 pg   MCHC 33.5 30.0 - 36.0 g/dL   RDW 13.4 11.5 - 15.5 %   Platelets 246 150 - 400 K/uL   nRBC 0.0 0.0 - 0.2 %   Neutrophils Relative % 61 %   Neutro Abs 3.0 1.7 - 7.7 K/uL   Lymphocytes Relative 29 %   Lymphs Abs 1.4 0.7 - 4.0 K/uL   Monocytes Relative 7 %   Monocytes Absolute 0.4 0.1 - 1.0 K/uL   Eosinophils Relative 2 %   Eosinophils Absolute 0.1 0.0 - 0.5 K/uL    Basophils Relative 1 %   Basophils Absolute 0.0 0.0 - 0.1 K/uL   Immature Granulocytes 0 %   Abs Immature Granulocytes 0.02 0.00 - 0.07 K/uL    Comment: Performed at [facility], [address] [place], [place] [address]  Comprehensive metabolic panel     Status: Abnormal   Collection Time: 11/22/18 10:59 AM  Result Value Ref Range   Sodium 139 135 - 145 mmol/L   Potassium 3.5 3.5 - 5.1 mmol/L   Chloride 105 98 - 111 mmol/L   CO2 24 22 - 32 mmol/L   Glucose, Bld 130 (H) 70 - 99 mg/dL   BUN 12 8 - 23 mg/dL   Creatinine, Ser 0.74 0.44 - 1.00 mg/dL   Calcium 9.2 8.9 - 10.3 mg/dL   Total Protein 7.1 6.5 - 8.1 g/dL   Albumin 3.9 3.5 - 5.0 g/dL   AST 26 15 - 41 U/L   ALT 17 0 - 44 U/L   Alkaline Phosphatase 65 38 - 126 U/L   Total Bilirubin 0.6 0.3 - 1.2 mg/dL   GFR calc non Af Amer >60 >60 mL/min   GFR calc Af Amer >60 >60 mL/min   Anion gap 10 5 - 15    Comment: Performed at [facility], [address] [place], [place] [address]  Troponin I - ONCE - STAT     Status: Abnormal   Collection Time: 11/22/18 10:59 AM  Result Value Ref Range   Troponin I 0.07 (HH) <0.03 ng/mL    Comment: CRITICAL RESULT CALLED TO, READ BACK BY AND VERIFIED WITH DEE Emmogene Simson @1155  11/22/18 AKT Performed at [facility], [address] [place], [place] [address]   Troponin I - ONCE - STAT     Status: Abnormal   Collection Time: 11/22/18 12:50 PM  Result Value Ref Range   Troponin I 0.09 (HH) <0.03 ng/mL    Comment: CRITICAL VALUE NOTED. VALUE IS CONSISTENT WITH PREVIOUSLY REPORTED/CALLED VALUE / [facility] Performed at [facility], [address] [place], [place] [address]    Ct Head Wo Contrast  Result Date: 11/22/2018 CLINICAL DATA:  [place] last night with weakness. EXAM: CT HEAD WITHOUT CONTRAST TECHNIQUE: Contiguous axial images were obtained from the base of the skull through the vertex without intravenous contrast. COMPARISON:  09/10/2018 FINDINGS: Brain:  Redemonstration of a 2 cm residual or recurrent meningioma at the left frontal vertex. Mild-to-moderate vasogenic edema in the left frontal lobe appears similar to the previous exams. No mass  effect or midline shift. Chronic small-vessel ischemic changes are present throughout the cerebral hemispheric white matter. No sign of acute infarction, hemorrhage, hydrocephalus or extra-axial collection. Vascular: There is atherosclerotic calcification of the major vessels at the base of the brain. Skull: Left frontal craniotomy changes as seen previously. Sinuses/Orbits: Clear/normal Other: None IMPRESSION: No acute finding by CT. Known residual or recurrent meningioma at the left medial frontal vertex measuring 2 cm in size, with mild-to-moderate vasogenic edema in the left frontal lobe. No sign of acute infarction or hemorrhage. Chronic small-vessel ischemic changes throughout the cerebral hemispheric white matter. Electronically Signed   By: [name] Chimes M.D.   On: 11/22/2018 11:53    Pending Labs Unresulted Labs (From admission, onward)    Start     Ordered   11/22/18 1052  Urinalysis, Complete w Microscopic  (ALOC)  ONCE - STAT,   STAT     11/22/18 1051   Signed and Held  HIV antibody (Routine Testing)  Once,   R     Signed and Held   Signed and Held  Hemoglobin A1c  Tomorrow morning,   R     Signed and Held   Signed and Held  Lipid panel  Tomorrow morning,   R    Comments:  Fasting    Signed and Held   Signed and Held  CBC  (enoxaparin (LOVENOX)    CrCl >/= 30 ml/min)  Once,   R    Comments:  Baseline for enoxaparin therapy IF NOT ALREADY DRAWN.  Notify MD if PLT < 100 K.    Signed and Held   Signed and Held  Creatinine, serum  (enoxaparin (LOVENOX)    CrCl >/= 30 ml/min)  Once,   R    Comments:  Baseline for enoxaparin therapy IF NOT ALREADY DRAWN.    Signed and Held   Signed and Held  Creatinine, serum  (enoxaparin (LOVENOX)    CrCl >/= 30 ml/min)  Weekly,   R    Comments:  while on  enoxaparin therapy    Signed and Held          Vitals/Pain Today's Vitals   11/22/18 1100 11/22/18 1200 11/22/18 1300 11/22/18 1400  BP: (!) 138/92 (!) 147/92 (!) 160/96 (!) 160/109  Pulse:  69 61 60  Resp: (!) 23 18 17  (!) 28  Temp:      TempSrc:      SpO2:  98% 98% 100%  Weight:      Height:      PainSc:        Isolation Precautions No active isolations  Medications Medications  insulin aspart (novoLOG) injection 0-20 Units (has no administration in time range)  insulin aspart (novoLOG) injection 0-5 Units (has no administration in time range)    Mobility walks High fall risk   Focused Assessments    R Recommendations: See Admitting Provider Note  Report given to: Lenna Sciara, RN

## 2018-11-22 NOTE — ED Notes (Signed)
Patient transported to CT 

## 2018-11-22 NOTE — Progress Notes (Signed)
Family Meeting Note  Advance Directive:yes  Today a meeting took place with the Patient.  Patient is able to participate   The following clinical team members were present during this meeting:MD  The following were discussed:Patient's diagnosis: Paresthesias, fall, brain tumor, schizophrenia, Patient's progosis: Unable to determine and Goals for treatment: DNR  Additional follow-up to be provided: prn  Time spent during discussion:20 minutes  Gorden Harms, MD

## 2018-11-22 NOTE — ED Triage Notes (Signed)
Pt to ER via [facility] from home c/o fall last night from weakness. EMS came to house last night, refused treatment. Pt reports another episode of weakness and fell on floor in kitchen, no LOC. Hx of right sided weakness due to stroke. EDP at bedside.  CBG 144. HR 80 NSR, 97% on RA 168/125 BP reported.

## 2018-11-22 NOTE — ED Notes (Signed)
Walked pt to the toilet and back to bed. Was unable to get a urine specimen at this time.

## 2018-11-22 NOTE — H&P (Signed)
[place] [facility] [place] NAME: Lauren [name]    MR#:  [id number]  DATE OF BIRTH:  13-May-1951  DATE OF ADMISSION:  11/22/2018  PRIMARY CARE PHYSICIAN: Steele Sizer, MD   REQUESTING/REFERRING PHYSICIAN:   CHIEF COMPLAINT:   Chief Complaint  Patient presents with  . Fall    HISTORY OF PRESENT ILLNESS: Lauren [name]  is a 68 y.o. female with a known history per below, presenting to the emergency room status post fall, generalized weakness, last night patient had refused to come to the emergency room for care, today patient complains of right facial/arm/leg paresthesias numbness/tingling which led to fall from seated position, patient did not lose consciousness, denies any new weakness, in the emergency room patient was found to have a troponin of 0.07 did increase to 0.09, CT of the head noted for residual meningioma with vasogenic edema, hospitalist asked to evaluate/treat, patient evaluated in the emergency room, only complaining of dry mouth, in no apparent distress, resting comfortably in bed, patient is now being admitted for acute right-sided paresthesias resulting in fall, incidentally found elevated troponins without chest pain.  PAST MEDICAL HISTORY:   Past Medical History:  Diagnosis Date  . Anxiety   . Apnea, sleep 02/02/2014  . Awareness of heartbeats 02/02/2014  . Breathlessness on exertion 02/02/2014  . Diabetes mellitus   . Encounter for pre-employment examination 06/29/2015  . Excessive sweating 07/05/2015  . Fibromyalgia   . GERD (gastroesophageal reflux disease)   . Gravida 1 10/26/2015   1.    . Heart murmur   . Herniated disc   . Hyperlipidemia   . Hypertension   . Itch of skin 10/26/2015  . Lack of bladder control   . Lung tumor   . Rheumatoid arthritis ([place])   . Schizoaffective disorder ([place])   . Screening for cervical cancer 07/29/2017  . Seizure [facility])    after brain surgery 2012. last seizure 2013!  [name] Sex  counseling 10/26/2015  . Sleep apnea   . 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 Stiffness of both knees 06/22/2015  . Stroke [facility])     PAST SURGICAL HISTORY:  Past Surgical History:  Procedure Laterality Date  . BRAIN TUMOR EXCISION  2012   benign  . CARDIAC CATHETERIZATION  2008  . CESAREAN SECTION    . CYST REMOVAL HAND    . LUNG LOBECTOMY  1977   benign tumor  . TOTAL KNEE ARTHROPLASTY Left 01/28/2017   Procedure: TOTAL KNEE ARTHROPLASTY;  Surgeon: Corky Mull, MD;  Location: [facility] ORS;  Service: Orthopedics;  Laterality: Left;    SOCIAL HISTORY:  Social History   Tobacco Use  . Smoking status: Never Smoker  . Smokeless tobacco: Never Used  Substance Use Topics  . Alcohol use: No    Alcohol/week: 0.0 standard drinks    FAMILY HISTORY:  Family History  Problem Relation Age of Onset  . Heart disease Brother   . Depression Mother   . Heart attack Mother   . Stroke Mother   . Alcohol abuse Father   . Stroke Father   . Diabetes Sister   . Diabetes Sister   . Stomach cancer Sister   . Kidney disease Sister   . COPD Brother   . Lung cancer Brother   . Diabetes Brother     DRUG ALLERGIES:  Allergies  Allergen Reactions  . Percocet [Oxycodone-Acetaminophen] Diarrhea, Nausea And Vomiting and Nausea Only  .  Tramadol Hcl Diarrhea, Nausea And Vomiting and Nausea Only  . Vicodin [Hydrocodone-Acetaminophen] Diarrhea, Nausea And Vomiting and Nausea Only    REVIEW OF SYSTEMS:   CONSTITUTIONAL: No fever, +fatigue, weakness.  EYES: No blurred or double vision.  EARS, NOSE, AND THROAT: No tinnitus or ear pain.  RESPIRATORY: No cough, shortness of breath, wheezing or hemoptysis.  CARDIOVASCULAR: No chest pain, orthopnea, edema.  GASTROINTESTINAL: No nausea, vomiting, diarrhea or abdominal pain.  GENITOURINARY: No dysuria, hematuria.  ENDOCRINE: No polyuria, nocturia,  HEMATOLOGY: No anemia, easy bruising or bleeding SKIN: No rash or  lesion. MUSCULOSKELETAL: No joint pain or arthritis.   NEUROLOGIC: Right face/arm/leg numbness/tingling, chronic left leg weakness pSYCHIATRY: No anxiety or depression.   MEDICATIONS AT HOME:  Prior to Admission medications   Medication Sig Start Date End Date Taking? Authorizing Provider  Artificial Saliva (BIOTENE DRY MOUTH MOISTURIZING) SOLN Use as directed 1 application in the mouth or throat 2 (two) times daily. 09/28/18  Yes Hubbard Hartshorn, FNP  Ascorbic Acid (VITAMIN C) 500 MG CHEW Chew 500 mg by mouth daily as needed (cold/flu symptoms).    Yes [provider]  aspirin EC 81 MG tablet Take 1 tablet (81 mg total) by mouth daily before breakfast. 04/01/18  Yes Sowles, Drue Stager, MD  atorvastatin (LIPITOR) 40 MG tablet Take 1 tablet (40 mg total) by mouth daily at 6 PM. 11/04/18  Yes Sowles, Drue Stager, MD  benztropine (COGENTIN) 1 MG tablet Take 1 mg by mouth 2 (two) times daily.    Yes [provider]  Dentifrices (BIOTENE DRY MOUTH GENTLE) PSTE Place 1 application onto teeth 2 (two) times daily. 09/28/18  Yes Hubbard Hartshorn, FNP  Elastic Bandages & Supports (KNEE COMPRESSION SLEEVE/L/XL) MISC 2 each by Does not apply route as needed. Wear during day; take off at night 05/12/18  Yes Poulose, Bethel Born, NP  escitalopram (LEXAPRO) 10 MG tablet Take 10 mg by mouth daily.    Yes [provider]  gabapentin (NEURONTIN) 100 MG capsule Take 100 mg by mouth 2 (two) times daily.   Yes [provider]  haloperidol decanoate (HALDOL DECANOATE) 100 MG/ML injection Inject 1 mL (100 mg total) into the muscle every 28 (twenty-eight) days. Next injection due on 01/27/18 01/27/18  Yes McNew, Tyson Babinski, MD  hydrochlorothiazide (HYDRODIURIL) 25 MG tablet Take 1 tablet (25 mg total) by mouth daily. 11/04/18  Yes Sowles, Drue Stager, MD  ibuprofen (ADVIL,MOTRIN) 200 MG tablet Take 600 mg by mouth every 8 (eight) hours as needed (for knee pain.).   Yes [provider]  lisinopril  (PRINIVIL,ZESTRIL) 20 MG tablet Take 1 tablet (20 mg total) by mouth daily. 11/04/18  Yes Sowles, Drue Stager, MD  metFORMIN (GLUCOPHAGE) 500 MG tablet Take 1 tablet (500 mg total) by mouth 2 (two) times daily with a meal. 11/04/18  Yes Sowles, Drue Stager, MD  omeprazole (PRILOSEC) 20 MG capsule Take 1 capsule (20 mg total) by mouth 2 (two) times daily. 10/06/18  Yes Sowles, Drue Stager, MD  traZODone (DESYREL) 50 MG tablet Take 1 tablet (50 mg total) by mouth at bedtime. 01/12/18  Yes McNew, Tyson Babinski, MD      PHYSICAL EXAMINATION:   VITAL SIGNS: Blood pressure (!) 160/109, pulse 60, temperature 97.7 F (36.5 C), temperature source Oral, resp. rate (!) 28, height 5\' 5"  (1.651 m), weight 112 kg, SpO2 100 %.  GENERAL:  68 y.o.-year-old patient lying in the bed with no acute distress.  Chronic extreme morbid obesity EYES: Pupils equal, round, reactive  to light and accommodation. No scleral icterus. Extraocular muscles intact.  HEENT: Head atraumatic, normocephalic. Oropharynx and nasopharynx clear.  NECK:  Supple, no jugular venous distention. No thyroid enlargement, no tenderness.  LUNGS: Normal breath sounds bilaterally, no wheezing, rales,rhonchi or crepitation. No use of accessory muscles of respiration.  CARDIOVASCULAR: S1, S2 normal. No murmurs, rubs, or gallops.  ABDOMEN: Soft, nontender, nondistended. Bowel sounds present. No organomegaly or mass.  EXTREMITIES: No pedal edema, cyanosis, or clubbing.  NEUROLOGIC: Cranial nerves II through XII are intact.  Chronic left leg weakness 4/5. Gait not checked.  Dysarthria noted PSYCHIATRIC: The patient is alert and oriented x 3.  SKIN: No obvious rash, lesion, or ulcer.   LABORATORY PANEL:   CBC Recent Labs  Lab 11/22/18 1059  WBC 4.8  HGB 12.5  HCT 37.3  PLT 246  MCV 93.5  MCH 31.3  MCHC 33.5  RDW 13.4  LYMPHSABS 1.4  MONOABS 0.4  EOSABS 0.1  BASOSABS 0.0    ------------------------------------------------------------------------------------------------------------------  Chemistries  Recent Labs  Lab 11/22/18 1059  NA 139  K 3.5  CL 105  CO2 24  GLUCOSE 130*  BUN 12  CREATININE 0.74  CALCIUM 9.2  AST 26  ALT 17  ALKPHOS 65  BILITOT 0.6   ------------------------------------------------------------------------------------------------------------------ estimated creatinine clearance is 87.4 mL/min (by C-G formula based on SCr of 0.74 mg/dL). ------------------------------------------------------------------------------------------------------------------ No results for input(s): TSH, T4TOTAL, T3FREE, THYROIDAB in the last 72 hours.  Invalid input(s): FREET3   Coagulation profile No results for input(s): INR, PROTIME in the last 168 hours. ------------------------------------------------------------------------------------------------------------------- No results for input(s): DDIMER in the last 72 hours. -------------------------------------------------------------------------------------------------------------------  Cardiac Enzymes Recent Labs  Lab 11/22/18 1059 11/22/18 1250  TROPONINI 0.07* 0.09*   ------------------------------------------------------------------------------------------------------------------ Invalid input(s): POCBNP  ---------------------------------------------------------------------------------------------------------------  Urinalysis    Component Value Date/Time   COLORURINE YELLOW (A) 12/28/2017 2144   APPEARANCEUR HAZY (A) 12/28/2017 2144   APPEARANCEUR Clear 11/08/2011 0036   LABSPEC 1.021 12/28/2017 2144   LABSPEC 1.026 11/08/2011 0036   PHURINE 7.0 12/28/2017 2144   GLUCOSEU NEGATIVE 12/28/2017 2144   GLUCOSEU Negative 11/08/2011 0036   HGBUR NEGATIVE 12/28/2017 2144   BILIRUBINUR NEGATIVE 12/28/2017 2144   BILIRUBINUR neg 12/09/2017 0942   BILIRUBINUR Negative 11/08/2011  0036   KETONESUR NEGATIVE 12/28/2017 2144   PROTEINUR 100 (A) 12/28/2017 2144   UROBILINOGEN 0.2 12/09/2017 0942   UROBILINOGEN 0.2 02/26/2012 0938   NITRITE NEGATIVE 12/28/2017 2144   LEUKOCYTESUR NEGATIVE 12/28/2017 2144   LEUKOCYTESUR Negative 11/08/2011 0036     RADIOLOGY: Ct Head Wo Contrast  Result Date: 11/22/2018 CLINICAL DATA:  [place] last night with weakness. EXAM: CT HEAD WITHOUT CONTRAST TECHNIQUE: Contiguous axial images were obtained from the base of the skull through the vertex without intravenous contrast. COMPARISON:  09/10/2018 FINDINGS: Brain: Redemonstration of a 2 cm residual or recurrent meningioma at the left frontal vertex. Mild-to-moderate vasogenic edema in the left frontal lobe appears similar to the previous exams. No mass effect or midline shift. Chronic small-vessel ischemic changes are present throughout the cerebral hemispheric white matter. No sign of acute infarction, hemorrhage, hydrocephalus or extra-axial collection. Vascular: There is atherosclerotic calcification of the major vessels at the base of the brain. Skull: Left frontal craniotomy changes as seen previously. Sinuses/Orbits: Clear/normal Other: None IMPRESSION: No acute finding by CT. Known residual or recurrent meningioma at the left medial frontal vertex measuring 2 cm in size, with mild-to-moderate vasogenic edema in the left frontal lobe. No sign of acute infarction or hemorrhage. Chronic small-vessel  ischemic changes throughout the cerebral hemispheric white matter. Electronically Signed   By: [name] Chimes M.D.   On: 11/22/2018 11:53    EKG: Orders placed or performed during the hospital encounter of 11/22/18  . ED EKG  . ED EKG  . EKG 12-Lead  . EKG 12-Lead  . EKG 12-Lead  . EKG 12-Lead    IMPRESSION AND PLAN: *Acute right face/arm/leg paresthesias With history of chronic CVA with left leg weakness, dysarthria Referred to the observation unit on our CVA/TIA protocol, check MRI of the  brain, echocardiogram, carotid Dopplers for further evaluation, neurology/Dr. Doy Mince consulted for expert opinion, PT/OT/speech therapy to evaluate/treat, aspiration/fall precautions, head of bed at 30 degrees, neurochecks per routine, and continue close medical monitoring  *Acute incidentally found elevated troponins without chest pain Conservative medical management  *Chronic obstructive sleep apnea CPAP at bedtime/as needed, respiratory therapy for settings  *Chronic diabetes mellitus type 2 Hold metformin while in house, sliding-scale insulin Accu-Cheks per routine  *Chronic hypertension Stable Continue home regiment  *Chronic residual meningioma CT head noted for residual meningioma with vasogenic edema Seizure precautions, Ativan as needed seizures, neurology consulted as patient may require AEDs for prophylactic seizure prevention, check EEG for further evaluation  *Chronic schizophrenia Stable Continue home regiment  All the records are reviewed and case discussed with ED provider. Management plans discussed with the patient, family and they are in agreement.  CODE STATUS:full Code Status History    Date Active Date Inactive Code Status Order ID Comments User Context   12/29/2017 1820 01/13/2018 1427 Full Code [id number]  Gonzella Lex, MD Inpatient   11/04/2017 1622 11/18/2017 1542 Full Code [id number]  Gonzella Lex, MD Inpatient   09/08/2017 1517 09/19/2017 1646 Full Code [id number]  Gonzella Lex, MD Inpatient   01/28/2017 1106 01/30/2017 1824 Full Code [id number]  Poggi, Marshall Cork, MD Inpatient       TOTAL TIME TAKING CARE OF THIS PATIENT: 40 minutes.    Avel Peace Toni Demo M.D on 11/22/2018   Between 7am to 6pm - Pager - [phone]  After 6pm go to [email or web address] - password [email or web address] [facility]  Office  [phone]  CC: Primary care physician; Steele Sizer, MD   Note: This dictation was prepared with Dragon dictation along with smaller  phrase technology. Any transcriptional errors that result from this process are unintentional.

## 2018-11-22 NOTE — ED Provider Notes (Addendum)
[facility] Emergency Department Provider Note       Time seen: ----------------------------------------- 10:50 AM on 11/22/2018 -----------------------------------------   I have reviewed the triage vital signs and the nursing notes.  HISTORY   Chief Complaint Fall   HPI Lauren [name] is a 68 y.o. female with a history of diabetes, fibromyalgia, hyperlipidemia, hypertension, schizoaffective disorder, seizure, brain injury, CVA who presents to the ED for fall last night related to weakness.  Patient states she fell again today.  EMS reportedly went and did an evaluation last night but she refused any treatment.  She had another episode of weakness and fell on the floor in the kitchen.  She has a previous history of mild right-sided weakness from CVA.  Past Medical History:  Diagnosis Date  . Anxiety   . Apnea, sleep 02/02/2014  . Awareness of heartbeats 02/02/2014  . Breathlessness on exertion 02/02/2014  . Diabetes mellitus   . Encounter for pre-employment examination 06/29/2015  . Excessive sweating 07/05/2015  . Fibromyalgia   . GERD (gastroesophageal reflux disease)   . Gravida 1 10/26/2015   1.    . Heart murmur   . Herniated disc   . Hyperlipidemia   . Hypertension   . Itch of skin 10/26/2015  . Lack of bladder control   . Lung tumor   . Rheumatoid arthritis ([place])   . Schizoaffective disorder ([place])   . Screening for cervical cancer 07/29/2017  . Seizure [facility])    after brain surgery 2012. last seizure 2013!  [name] Sex counseling 10/26/2015  . Sleep apnea   . 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 Stiffness of both knees 06/22/2015  . Stroke [facility])     Patient Active Problem List   Diagnosis Date Noted  . Dry mouth 10/12/2018  . Hx of resection of meningioma 07/21/2018  . Tremor 07/21/2018  . Schizoaffective disorder ([place]) 12/29/2017  . Morbid obesity ([place]) 12/09/2017  . Meningioma ([place]) 11/25/2017  .  Atherosclerosis of aorta ([place]) 11/25/2017  . Calcification of coronary artery 11/25/2017  . Schizophrenia ([place]) 11/04/2017  . Acid reflux 06/29/2015  . Diabetes mellitus type 2, controlled, without complications ([place]) 95/18/8416  . Cardiac murmur 06/29/2015  . Arthritis of knee, degenerative 06/28/2015  . Insomnia w/ sleep apnea 03/21/2015  . Anxiety disorder 03/21/2015  . Hypertension 03/21/2015  . HLD (hyperlipidemia) 03/21/2015  . Urge incontinence 03/21/2015    Past Surgical History:  Procedure Laterality Date  . BRAIN TUMOR EXCISION  2012   benign  . CARDIAC CATHETERIZATION  2008  . CESAREAN SECTION    . CYST REMOVAL HAND    . LUNG LOBECTOMY  1977   benign tumor  . TOTAL KNEE ARTHROPLASTY Left 01/28/2017   Procedure: TOTAL KNEE ARTHROPLASTY;  Surgeon: Corky Mull, MD;  Location: [facility] ORS;  Service: Orthopedics;  Laterality: Left;    Allergies Percocet [oxycodone-acetaminophen]; Tramadol hcl; and Vicodin [hydrocodone-acetaminophen]  Social History Social History   Tobacco Use  . Smoking status: Never Smoker  . Smokeless tobacco: Never Used  Substance Use Topics  . Alcohol use: No    Alcohol/week: 0.0 standard drinks  . Drug use: No   Review of Systems Constitutional: Negative for fever. Cardiovascular: Negative for chest pain. Respiratory: Negative for shortness of breath. Gastrointestinal: Negative for abdominal pain, vomiting and diarrhea. Musculoskeletal: Negative for back pain. Skin: Negative for rash. Neurological: Negative for headaches, positive for generalized weakness  All systems negative/normal/unremarkable except as stated in  the HPI  ____________________________________________   PHYSICAL EXAM:  VITAL SIGNS: ED Triage Vitals  Enc Vitals Group     BP      Pulse      Resp      Temp      Temp src      SpO2      Weight      Height      Head Circumference      Peak Flow      Pain Score      Pain Loc      Pain Edu?      Excl. in [place]?      Constitutional: Alert and oriented. Well appearing and in no distress. Eyes: Conjunctivae are normal. Normal extraocular movements. ENT      Head: Normocephalic and atraumatic.      Nose: No congestion/rhinnorhea.      Mouth/Throat: Mucous membranes are dry      Neck: No stridor. Cardiovascular: Normal rate, regular rhythm. No murmurs, rubs, or gallops. Respiratory: Normal respiratory effort without tachypnea nor retractions. Breath sounds are clear and equal bilaterally. No wheezes/rales/rhonchi. Gastrointestinal: Soft and nontender. Normal bowel sounds Musculoskeletal: Nontender with normal range of motion in extremities. No lower extremity tenderness nor edema. Neurologic:  Normal speech and language. No gross focal neurologic deficits are appreciated.  Mild right-sided arm weakness compared to the left.  Some dysarthria is noted Skin:  Skin is warm, dry and intact. No rash noted. Psychiatric: Mood and affect are normal.  Dysarthria is noted ____________________________________________  EKG: Interpreted by me.  Sinus rhythm rate of 65 bpm, low voltage, leftward axis, normal QT  Repeat EKG: Interpreted by me, sinus rhythm the rate of 59 bpm, low voltage, normal axis, normal QT  ____________________________________________  ED COURSE:  As part of my medical decision making, I reviewed the following data within the [place] History obtained from family if available, nursing notes, old chart and ekg, as well as notes from prior ED visits. Patient presented for weakness with recent falls, we will assess with labs and imaging as indicated at this time.   Procedures ____________________________________________   LABS (pertinent positives/negatives)  Labs Reviewed  COMPREHENSIVE METABOLIC PANEL - Abnormal; Notable for the following components:      Result Value   Glucose, Bld 130 (*)    All other components within normal limits  TROPONIN I - Abnormal; Notable  for the following components:   Troponin I 0.07 (*)    All other components within normal limits  TROPONIN I - Abnormal; Notable for the following components:   Troponin I 0.09 (*)    All other components within normal limits  CBC WITH DIFFERENTIAL/PLATELET  URINALYSIS, COMPLETE (UACMP) WITH MICROSCOPIC    RADIOLOGY Images were viewed by me  CT head IMPRESSION: Apparent recurrent or residual meningioma anterior superior left frontal lobe, stable. No appreciable mass effect from this mass. No other mass. There is encephalomalacia in this area with prior postoperative change, stable.  Periventricular small vessel disease, stable. No acute infarct evident. No hemorrhage or extra-axial fluid collection.  There are foci of arterial vascular calcification. There is mucosal thickening in several ethmoid air cells.  ____________________________________________   DIFFERENTIAL DIAGNOSIS   Dehydration, electrolyte abnormality, occult infection, medication side effect  FINAL ASSESSMENT AND PLAN  Weakness, recent fall, elevated troponin   Plan: The patient had presented for weakness with recent falls. Patient's labs did reveal an elevated troponin which is increasing from 0.07  to .09. Patient's imaging did not reveal any acute process.  No clear etiology for her elevated troponin.  I will discuss with the hospitalist for admission.   Laurence Aly, MD    Note: This note was generated in part or whole with voice recognition software. Voice recognition is usually quite accurate but there are transcription errors that can and very often do occur. I apologize for any typographical errors that were not detected and corrected.     Earleen Newport, MD 11/22/18 1412    Earleen Newport, MD 11/22/18 [phone]

## 2018-11-22 NOTE — ED Notes (Signed)
Report to Dee, RN.

## 2018-11-23 ENCOUNTER — Observation Stay
Admit: 2018-11-23 | Discharge: 2018-11-23 | Disposition: A | Discharge status: Home / Self Care | Payer: Medicare Other | Attending: Family Medicine | Admitting: Family Medicine

## 2018-11-23 ENCOUNTER — Other Ambulatory Visit: Payer: Self-pay

## 2018-11-23 ENCOUNTER — Inpatient Hospital Stay: Payer: Medicare Other

## 2018-11-23 DIAGNOSIS — I10 Essential (primary) hypertension: Secondary | ICD-10-CM | POA: Diagnosis present

## 2018-11-23 DIAGNOSIS — Z811 Family history of alcohol abuse and dependence: Secondary | ICD-10-CM | POA: Diagnosis not present

## 2018-11-23 DIAGNOSIS — I69322 Dysarthria following cerebral infarction: Secondary | ICD-10-CM | POA: Diagnosis not present

## 2018-11-23 DIAGNOSIS — Z823 Family history of stroke: Secondary | ICD-10-CM | POA: Diagnosis not present

## 2018-11-23 DIAGNOSIS — W1830XA Fall on same level, unspecified, initial encounter: Secondary | ICD-10-CM | POA: Diagnosis present

## 2018-11-23 DIAGNOSIS — Z841 Family history of disorders of kidney and ureter: Secondary | ICD-10-CM | POA: Diagnosis not present

## 2018-11-23 DIAGNOSIS — I69351 Hemiplegia and hemiparesis following cerebral infarction affecting right dominant side: Secondary | ICD-10-CM | POA: Diagnosis not present

## 2018-11-23 DIAGNOSIS — Z818 Family history of other mental and behavioral disorders: Secondary | ICD-10-CM | POA: Diagnosis not present

## 2018-11-23 DIAGNOSIS — Z96652 Presence of left artificial knee joint: Secondary | ICD-10-CM | POA: Diagnosis present

## 2018-11-23 DIAGNOSIS — Z833 Family history of diabetes mellitus: Secondary | ICD-10-CM | POA: Diagnosis not present

## 2018-11-23 DIAGNOSIS — F419 Anxiety disorder, unspecified: Secondary | ICD-10-CM | POA: Diagnosis present

## 2018-11-23 DIAGNOSIS — Z801 Family history of malignant neoplasm of trachea, bronchus and lung: Secondary | ICD-10-CM | POA: Diagnosis not present

## 2018-11-23 DIAGNOSIS — R202 Paresthesia of skin: Secondary | ICD-10-CM | POA: Diagnosis present

## 2018-11-23 DIAGNOSIS — E1151 Type 2 diabetes mellitus with diabetic peripheral angiopathy without gangrene: Secondary | ICD-10-CM | POA: Diagnosis present

## 2018-11-23 DIAGNOSIS — K219 Gastro-esophageal reflux disease without esophagitis: Secondary | ICD-10-CM | POA: Diagnosis present

## 2018-11-23 DIAGNOSIS — R569 Unspecified convulsions: Secondary | ICD-10-CM | POA: Diagnosis present

## 2018-11-23 DIAGNOSIS — Z6841 Body Mass Index (BMI) 40.0 and over, adult: Secondary | ICD-10-CM | POA: Diagnosis not present

## 2018-11-23 DIAGNOSIS — Z800 Family history of malignant neoplasm of digestive organs: Secondary | ICD-10-CM | POA: Diagnosis not present

## 2018-11-23 DIAGNOSIS — R7989 Other specified abnormal findings of blood chemistry: Secondary | ICD-10-CM | POA: Diagnosis present

## 2018-11-23 DIAGNOSIS — R682 Dry mouth, unspecified: Secondary | ICD-10-CM | POA: Diagnosis present

## 2018-11-23 DIAGNOSIS — E785 Hyperlipidemia, unspecified: Secondary | ICD-10-CM | POA: Diagnosis present

## 2018-11-23 DIAGNOSIS — R531 Weakness: Secondary | ICD-10-CM | POA: Diagnosis present

## 2018-11-23 DIAGNOSIS — E119 Type 2 diabetes mellitus without complications: Secondary | ICD-10-CM | POA: Diagnosis not present

## 2018-11-23 DIAGNOSIS — W19XXXA Unspecified fall, initial encounter: Secondary | ICD-10-CM | POA: Diagnosis not present

## 2018-11-23 DIAGNOSIS — Z885 Allergy status to narcotic agent status: Secondary | ICD-10-CM | POA: Diagnosis not present

## 2018-11-23 DIAGNOSIS — D329 Benign neoplasm of meninges, unspecified: Secondary | ICD-10-CM | POA: Diagnosis present

## 2018-11-23 DIAGNOSIS — G4733 Obstructive sleep apnea (adult) (pediatric): Secondary | ICD-10-CM | POA: Diagnosis present

## 2018-11-23 DIAGNOSIS — Z8249 Family history of ischemic heart disease and other diseases of the circulatory system: Secondary | ICD-10-CM | POA: Diagnosis not present

## 2018-11-23 LAB — LIPID PANEL
Cholesterol: 157 mg/dL (ref 0–200)
HDL: 38 mg/dL — ABNORMAL LOW (ref >40)
LDL Cholesterol: 97 mg/dL (ref 0–99)
Total CHOL/HDL Ratio: 4.1 RATIO
Triglycerides: 108 mg/dL (ref <150)
VLDL: 22 mg/dL (ref 0–40)

## 2018-11-23 LAB — URINALYSIS, COMPLETE (UACMP) WITH MICROSCOPIC
Bacteria, UA: NONE SEEN
Bilirubin Urine: NEGATIVE
Glucose, UA: NEGATIVE mg/dL
Hgb urine dipstick: NEGATIVE
Ketones, ur: NEGATIVE mg/dL
Leukocytes,Ua: NEGATIVE
Nitrite: NEGATIVE
Protein, ur: NEGATIVE mg/dL
Specific Gravity, Urine: 1.014 (ref 1.005–1.030)
pH: 7 (ref 5.0–8.0)

## 2018-11-23 LAB — GLUCOSE, CAPILLARY
Glucose-Capillary: 103 mg/dL — ABNORMAL HIGH (ref 70–99)
Glucose-Capillary: 121 mg/dL — ABNORMAL HIGH (ref 70–99)
Glucose-Capillary: 104 mg/dL — ABNORMAL HIGH (ref 70–99)
Glucose-Capillary: 123 mg/dL — ABNORMAL HIGH (ref 70–99)

## 2018-11-23 LAB — HEMOGLOBIN A1C
Hgb A1c MFr Bld: 6.4 % — ABNORMAL HIGH (ref 4.8–5.6)
Mean Plasma Glucose: 136.98 mg/dL

## 2018-11-23 LAB — ECHOCARDIOGRAM COMPLETE
Height: 65 in
Weight: 3910.4 oz

## 2018-11-23 LAB — TROPONIN I
Troponin I: <0.03 ng/mL (ref <0.03)
Troponin I: <0.03 ng/mL (ref <0.03)
Troponin I: <0.03 ng/mL (ref <0.03)
Troponin I: <0.03 ng/mL (ref <0.03)

## 2018-11-23 MED ORDER — DIVALPROEX SODIUM 500 MG PO DR TAB
500.0000 mg | DELAYED_RELEASE_TABLET | Freq: Two times a day (BID) | ORAL | Status: DC
  Administered 2018-11-23 – 2018-11-24 (×2): 500 mg via ORAL
  Filled 2018-11-23 (×3): qty 1

## 2018-11-23 MED ORDER — GADOBUTROL 1 MMOL/ML IV SOLN
10.0000 mL | Freq: Once | INTRAVENOUS | Status: AC | PRN
  Administered 2018-11-23: 22:00:00 10 mL via INTRAVENOUS

## 2018-11-23 NOTE — Procedures (Signed)
History: 68 year old female being evaluated for paresthesias.  Sedation: None  Technique: This is a 21 channel routine scalp EEG performed at the bedside with bipolar and monopolar montages arranged in accordance to the international 10/20 system of electrode placement. One channel was dedicated to EKG recording.    Background: The background consists predominantly of intermixed alpha and beta activities. There is a well defined posterior dominant rhythm of 9 - 10 Hz that attenuates with eye opening.  There is focal irregular slow activity most prominent in the left central region, though with wide field.  Photic stimulation: Physiologic driving is performed  EEG Abnormalities: 1) left hemispheric slow activity  Clinical Interpretation: This EEG is consistent with a focal cerebral disturbance in the left hemisphere without definite seizure predisposition.   There was no seizure or seizure predisposition recorded on this study. Please note that lack of epileptiform activity on EEG does not preclude the possibility of epilepsy.   Roland Rack, MD [facility] [phone]  If 7pm- 7am, please page neurology on call as listed in [place].

## 2018-11-23 NOTE — Evaluation (Signed)
Physical Therapy Evaluation Patient Details Name: Lauren [name] MRN: [id number] DOB: 1950-10-10 Today's Date: 11/23/2018   History of Present Illness  Pt is a 68 y.o. female who presented to the ED on 11/22/18 after a fall related to right sided paresthesias and general weakness. MRI (-) for acute infarct. 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    Clinical Impression  Pt's BUE and BLE strength WFL and grossly equal left vs right.  Sensation to light touch present and equal right vs left to the face, BUEs, and BLEs.  Min BUE fine motor coordination deficits noted but grossly equal left vs right.  Pt was Mod ind with bed mobility tasks and Ind with transfers.  Pt was able to amb 200' with a RW and SBA with reduced cadence that is baseline per pt with pt reporting walking that distance was "easy".  Pt reports just having completed a round of HHPT services.  Pt reports feeling back to baseline at time of PT evaluation.  Will complete PT orders at this time but will reassess pt pending a change in status upon receipt of new PT orders.      Follow Up Recommendations No PT follow up    Equipment Recommendations  None recommended by PT    Recommendations for Other Services       Precautions / Restrictions Precautions Precautions: Fall Restrictions Weight Bearing Restrictions: No      Mobility  Bed Mobility Overal bed mobility: Modified Independent             General bed mobility comments: Increased time and effort for bed mobility, at baseline per pt.  Transfers Overall transfer level: Independent Equipment used: Rolling walker (2 wheeled)             General transfer comment: Good eccentric and concentric control and stability during transfers  Ambulation/Gait Ambulation/Gait assistance: Supervision Gait Distance (Feet): 200  Feet Assistive device: Rolling walker (2 wheeled) Gait Pattern/deviations: Step-through pattern;Decreased step length - right;Decreased step length - left Gait velocity: decreased but baseline per pt   General Gait Details: Somewhat slow cadence but steady without LOB with pt stating amb 200' was "easy" from an effort perspective  Stairs            Wheelchair Mobility    Modified Rankin (Stroke Patients Only)       Balance Overall balance assessment: No apparent balance deficits (not formally assessed)                                           Pertinent Vitals/Pain Pain Assessment: No/denies pain Faces Pain Scale: No hurt    Home Living Family/patient expects to be discharged to:: Private residence Living Arrangements: Alone Available Help at Discharge: Family;Available PRN/intermittently Type of Home: Apartment Home Access: Level entry     Home Layout: One level Home Equipment: Walker - standard Additional Comments: Pt's walker in storage, chart review indicates pt has a 2WW but pt reported she believes it is a SW.  Walker received after left TKA in 2018 per pt.    Prior Function Level of Independence: Independent         Comments: Pt ind amb without AD community distances. Endorses 2-3 falls in past 6 months all related to her sudden onset of R sided weakness.  Hand Dominance   Dominant Hand: Right    Extremity/Trunk Assessment   Upper Extremity Assessment Upper Extremity Assessment: Defer to OT evaluation(Decreased fine motor bilaterally with BUE strength WFL)    Lower Extremity Assessment Lower Extremity Assessment: Overall WFL for tasks assessed;RLE deficits/detail;LLE deficits/detail RLE Deficits / Details: RLE strength grossly 4+ to 5/5 and equal L vs R RLE Sensation: WNL LLE Deficits / Details: LLE strength grossly 4+ to 5/5 and equal L vs R LLE Sensation: WNL       Communication   Communication: Expressive  difficulties  Cognition Arousal/Alertness: Awake/alert Behavior During Therapy: WFL for tasks assessed/performed Overall Cognitive Status: Within Functional Limits for tasks assessed                                 General Comments: Pt with noted expressive difficulties but cognition generally WFL for evaluation, A&O x4.       General Comments      Exercises     Assessment/Plan    PT Assessment Patent does not need any further PT services  PT Problem List         PT Treatment Interventions      PT Goals (Current goals can be found in the Care Plan section)  Acute Rehab PT Goals PT Goal Formulation: All assessment and education complete, DC therapy    Frequency     Barriers to discharge        Co-evaluation               AM-PAC PT "6 Clicks" Mobility  Outcome Measure Help needed turning from your back to your side while in a flat bed without using bedrails?: None Help needed moving from lying on your back to sitting on the side of a flat bed without using bedrails?: None Help needed moving to and from a bed to a chair (including a wheelchair)?: None Help needed standing up from a chair using your arms (e.g., wheelchair or bedside chair)?: None Help needed to walk in hospital room?: None Help needed climbing 3-5 steps with a railing? : None 6 Click Score: 24    End of Session Equipment Utilized During Treatment: Gait belt Activity Tolerance: Patient tolerated treatment well Patient left: in chair;with chair alarm set;with call bell/phone within reach Nurse Communication: Mobility status;Other (comment)(Pt has access to phone but needs a call bell) PT Visit Diagnosis: Muscle weakness (generalized) (M62.81)    Time: 1410-1435 PT Time Calculation (min) (ACUTE ONLY): 25 min   Charges:   PT Evaluation $PT Eval Low Complexity: 1 Low          D. Royetta Asal PT, DPT 11/23/18, 3:14 PM

## 2018-11-23 NOTE — Progress Notes (Signed)
Same day progress note Spoke with Dr. Lacinda Axon Neurosurgery - he will review images and see the patient tomorrow. Ordered MRI cervical spine w contrast per Dr. Lacinda Axon.  Ripley Fraise, PA

## 2018-11-23 NOTE — TOC Initial Note (Signed)
Transition of Care [facility]) - Initial/Assessment Note    Patient Details  Name: Lauren [name] MRN: [id number] Date of Birth: 11/11/1950  [facility] of [facility]) CM/SW Contact:    Shelbie Ammons, RN Phone Number: 11/23/2018, 2:53 PM  Clinical Narrative:     Admitted to [facility] with the diagnosis of arm, left face right sided weakness. Lives alone. Sister is Marcial Pacas [phone]). Sees Dr. Ancil Boozer as primary care physician              Expected Discharge Plan: [place] Barriers to Discharge: No Barriers Identified   Patient Goals and CMS Choice Patient states their goals for this hospitalization and ongoing recovery are:: (To go home) CMS Medicare.gov Compare Post Acute Care list provided to:: Patient Choice offered to / list presented to : Patient  Expected Discharge Plan and Services Expected Discharge Plan: [place] Discharge Planning Services: CM Consult Post Acute Care Choice: [place] arrangements for the past 2 months: Single Family Home Expected Discharge Date: 11/24/18               DME Arranged: [employer] has already paid for standard walker 2018. Advised to [facility] to [facility])   [place] Arranged: OT [place] Agency: [facility] [place]  Prior Living Arrangements/Services Living arrangements for the past 2 months: Single Family Home Lives with:: Self   Do you feel safe going back to the place where you live?: Yes      Need for Family Participation in Patient Care: No (Comment) Care giver support system in place?: Yes (comment) Current home services: Home PT(one more visit) Criminal Activity/Legal Involvement Pertinent to Current Situation/Hospitalization: No - Comment as needed  Activities of Daily Living Home Assistive Devices/Equipment: None ADL Screening (condition at time of admission) Patient's cognitive ability adequate to safely complete daily activities?: Yes Is the patient deaf or have  difficulty hearing?: No Does the patient have difficulty seeing, even when wearing glasses/contacts?: No Does the patient have difficulty concentrating, remembering, or making decisions?: No Patient able to express need for assistance with ADLs?: No Does the patient have difficulty dressing or bathing?: No Independently performs ADLs?: Yes (appropriate for developmental age) Does the patient have difficulty walking or climbing stairs?: Yes Weakness of Legs: Both Weakness of Arms/Hands: None  Permission Sought/Granted Permission sought to share information with : Case Manager Permission granted to share information with : Yes, Verbal Permission Granted              Emotional Assessment Appearance:: Appears stated age Attitude/Demeanor/Rapport: Gracious Affect (typically observed): Accepting Orientation: : Oriented to Self, Oriented to Place, Oriented to  Time, Oriented to Situation Alcohol / Substance Use: Never Used Psych Involvement: No (comment)  Admission diagnosis:  Weakness [R53.1] Elevated troponin I level [R79.89] Fall, initial encounter [W19.XXXA] Patient Active Problem List   Diagnosis Date Noted  . Paresthesias 11/22/2018  . Dry mouth 10/12/2018  . Hx of resection of meningioma 07/21/2018  . Tremor 07/21/2018  . Schizoaffective disorder ([place]) 12/29/2017  . Morbid obesity ([place]) 12/09/2017  . Meningioma ([place]) 11/25/2017  . Atherosclerosis of aorta ([place]) 11/25/2017  . Calcification of coronary artery 11/25/2017  . Schizophrenia ([place]) 11/04/2017  . Acid reflux 06/29/2015  . Diabetes mellitus type 2, controlled, without complications ([place]) 36/64/4034  . Cardiac murmur 06/29/2015  . Arthritis of knee, degenerative 06/28/2015  . Insomnia w/ sleep apnea 03/21/2015  . Anxiety disorder 03/21/2015  . Hypertension 03/21/2015  . HLD (  hyperlipidemia) 03/21/2015  . Urge incontinence 03/21/2015   PCP:  Steele Sizer, MD Pharmacy:   [place], [place] - [address] [address] Phone: [phone] Fax: [phone]     Social Determinants of Health (SDOH) Interventions    Readmission Risk Interventions  No flowsheet data found.

## 2018-11-23 NOTE — Care Management Obs Status (Signed)
[place] NOTIFICATION   Patient Details  Name: Lauren [name] MRN: [id number] Date of Birth: [birth year]   Medicare Observation Status Notification Given:   yes. Witnessed by Shelbie Ammons RN MSN [place], RN 11/23/2018, 2:47 PM

## 2018-11-23 NOTE — Evaluation (Signed)
Occupational Therapy Evaluation Patient Details Name: Lauren [name] MRN: [id number] DOB: 1951-08-06 Today's Date: 11/23/2018    History of Present Illness Lauren [name] is a 68 y.o. female who presented to the ED on 11/22/18 after a fall related to right sided weakness. MRI (-) for acute infarct. 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   Clinical Impression   Lauren [name] was seen for OT evaluation this date. Prior to hospital admission, pt was generally independent in all aspects of ADL/IADL, occasionally requiring assistance from her adult son for heavy IADLs such as laundry. Lauren [name] endorses 2-3 falls in the past 6 months. Two of those falls occurring within the past 48 hours and all relating to sudden onset of R-sided weakness where her RUE "siezes up".  Pt lives alone in a 1 story apartment home with a 3"  threshold to enter and no hand rails. Currently pt reporting symptoms of R sided weakness have generally resolved. Pt demonstrates baseline independence to perform ADL and mobility tasks with strength, sensory, coordination, cognitive, and visual skills WFL during this evaluation. No skilled OT needs identified in the acute care setting. Recommend HHOT upon hospital DC in order to ensure pt safety and maximize return to meaningful occupations of daily life. Will sign off. Please re-consult if additional OT needs arise.     Follow Up Recommendations  Home health OT    Equipment Recommendations  3 in 1 bedside commode;Toilet rise with handles    Recommendations for Other Services       Precautions / Restrictions Precautions Precautions: Fall Restrictions Weight Bearing Restrictions: No      Mobility Bed Mobility Overal bed mobility: Modified Independent             General bed mobility comments: Increased time and effort  for bed mobility. Pt appears at or near baseline per chart review.   Transfers                 General transfer comment: Increased time and effort for bed mobility with HOB raised for sup>sit. Pt able to stand EOB and complete side steps with supervision for safety. Pt appears at or near baseline, per chart review.     Balance Overall balance assessment: Mild deficits observed, not formally tested                                         ADL either performed or assessed with clinical judgement   ADL Overall ADL's : At baseline                                       General ADL Comments: Pt endorses being back to baseline level of function for ADL tasks. Able to complete functional transfers independently despite some right sided weakness.      Vision Baseline Vision/History: Wears glasses Wears Glasses: At all times Patient Visual Report: No change from baseline       Perception     Praxis      Pertinent Vitals/Pain Pain Assessment: Faces Faces Pain Scale: Hurts a little bit Pain Location: Pt denied pain, but was noted to grimmace with active shoulder movement on the right side. Limited activity within pt tolerance.  Pain Descriptors /  Indicators: Grimacing Pain Intervention(s): Limited activity within patient's tolerance;Monitored during session     Hand Dominance Right   Extremity/Trunk Assessment Upper Extremity Assessment Upper Extremity Assessment: Overall WFL for tasks assessed;RUE deficits/detail RUE Deficits / Details: Pt grossly 4/5 RUE with some increased pain with R shoulder flexion. Overall WFL.  RUE Coordination: decreased fine motor   Lower Extremity Assessment Lower Extremity Assessment: Overall WFL for tasks assessed;Defer to PT evaluation       Communication Communication Communication: Expressive difficulties   Cognition Arousal/Alertness: Awake/alert Behavior During Therapy: WFL for tasks  assessed/performed Overall Cognitive Status: Within Functional Limits for tasks assessed                                 General Comments: Pt with noted difficulty with word finding t/o session. At times difficult to understand d/t expressive difficulties, but generally [facility] for evaluation. Able to follow verbal prompts. A&O x4.    General Comments  Pt endorses general independence for completion of daily routines. At times requires assistance from adult son for heavy IADLs (laundry) primarily because she does not have a Hotel manager in her current apartment.     Exercises Other Exercises Other Exercises: Pt educated in falls prevention strategies, medication mgt, safe use of AE for ADLs, and ECS to support engagement and functional independence in meaningful occupations of daily life.    Shoulder Instructions      Home Living Family/patient expects to be discharged to:: Private residence Living Arrangements: Alone Available Help at Discharge: Family;Available PRN/intermittently Type of Home: Apartment Home Access: Other (comment)(Pt endorses 3" threshold to step into home. No others steps. )     Home Layout: One level     Bathroom Shower/Tub: Tub/shower unit;Curtain   Biochemist, clinical: Standard Bathroom Accessibility: Yes How Accessible: Accessible via walker Home Equipment: Walker - 2 wheels   Additional Comments: Pt unsure type of walker she has as it is in storage. Chart review indicates 2WW.      Prior Functioning/Environment Level of Independence: Independent        Comments: Pt ind amb without AD community distances. Endorses needing to hold onto shopping cart when shopping. Otherwise, does not use AD for ambulation in the home. Endorses 2-3 falls in past 6 months. All related to her sudden onset of R sided weakness.         OT Problem List: Decreased strength;Decreased safety awareness;Obesity;Impaired UE functional use;Decreased knowledge of use of  DME or AE;Decreased coordination;Decreased activity tolerance      OT Treatment/Interventions:      OT Goals(Current goals can be found in the care plan section) Acute Rehab OT Goals Patient Stated Goal: To feel better and go home OT Goal Formulation: All assessment and education complete, DC therapy Time For Goal Achievement: 11/23/18 Potential to Achieve Goals: Good  OT Frequency:     Barriers to D/C:            Co-evaluation              AM-PAC OT "6 Clicks" Daily Activity     Outcome Measure Help from another person eating meals?: None Help from another person taking care of personal grooming?: None Help from another person toileting, which includes using toliet, bedpan, or urinal?: None Help from another person bathing (including washing, rinsing, drying)?: A Little Help from another person to put on and taking off regular upper body clothing?: None Help from  another person to put on and taking off regular lower body clothing?: A Little 6 Click Score: 22   End of Session Equipment Utilized During Treatment: Gait belt Nurse Communication: Other (comment)(Discussed functional status with SW. )  Activity Tolerance: Patient tolerated treatment well Patient left: in bed;with call bell/phone within reach;with bed alarm set  OT Visit Diagnosis: Unsteadiness on feet (R26.81);History of falling (Z91.81);Other symptoms and signs involving the nervous system (R29.898);Hemiplegia and hemiparesis Hemiplegia - Right/Left: Right Hemiplegia - dominant/non-dominant: Dominant Hemiplegia - caused by: Other cerebrovascular disease                Time: 0846-0920 OT Time Calculation (min): 34 min Charges:  OT General Charges $OT Visit: 1 Visit OT Evaluation $OT Eval Low Complexity: 1 Low OT Treatments $Self Care/Home Management : 23-37 mins  Shara Blazing, M.S., OTR/L Ascom: [phone] 11/23/18, 11:04 AM

## 2018-11-23 NOTE — Progress Notes (Signed)
[facility] [place] NAME: Lauren [name]    MR#:  [id number]  DATE OF BIRTH:  25-Sep-1950  SUBJECTIVE:  CHIEF COMPLAINT:   Chief Complaint  Patient presents with  . Fall   Feels better, would like to go home. States she has services and is working on getting a Psychologist, clinical. Paresthesias have not recurred.    However on MRI chronic Left vertex parasagittal meningioma with vasogenic edema and mild mass-effect, Neurosurgery consulted Dr. Lacinda Axon at neuro recommendation. Depakote pending neurology recs, seen by NP Ouma.   REVIEW OF SYSTEMS:  CONSTITUTIONAL: No fever, + fatigue, + weakness.  EYES: No blurred or double vision.  EARS, NOSE, AND THROAT: No tinnitus or ear pain.  RESPIRATORY: No cough, shortness of breath, wheezing or hemoptysis.  CARDIOVASCULAR: No chest pain, orthopnea, edema.  GASTROINTESTINAL: No nausea, vomiting, diarrhea or abdominal pain.  GENITOURINARY: No dysuria, hematuria.  ENDOCRINE: No polyuria, nocturia,  HEMATOLOGY: No anemia, easy bruising or bleeding SKIN: No rash or lesion. MUSCULOSKELETAL: No joint pain or arthritis.   NEUROLOGIC: Right face/arm/leg numbness/tingling denied today, chronic left leg weakness  PSYCHIATRY: No anxiety or depression.  DRUG ALLERGIES:   Allergies  Allergen Reactions  . Percocet [Oxycodone-Acetaminophen] Diarrhea, Nausea And Vomiting and Nausea Only  . Tramadol Hcl Diarrhea, Nausea And Vomiting and Nausea Only  . Vicodin [Hydrocodone-Acetaminophen] Diarrhea, Nausea And Vomiting and Nausea Only   VITALS:  Blood pressure 133/84, pulse 79, temperature 98.6 F (37 C), temperature source Axillary, resp. rate 16, height 5\' 5"  (1.651 m), weight 110.9 kg, SpO2 100 %. PHYSICAL EXAMINATION:  GENERAL:  68 y.o.-year-old patient lying in the bed with no acute distress. BMI 40.67 EYES: Pupils equal, round, reactive to light and accommodation. No scleral icterus. Extraocular muscles intact.   HEENT: Head atraumatic, normocephalic. Oropharynx and nasopharynx clear.  NECK:  Supple, no jugular venous distention. No thyroid enlargement, no tenderness.  LUNGS: Normal breath sounds bilaterally, no wheezing, rales,rhonchi or crepitation. No use of accessory muscles of respiration.  CARDIOVASCULAR: S1, S2 normal. No murmurs, rubs, or gallops.  ABDOMEN: Soft, nontender, nondistended. Bowel sounds present. No organomegaly or mass.  EXTREMITIES: No pedal edema, cyanosis, or clubbing.  NEUROLOGIC: Cranial nerves II through XII are intact.  Chronic left leg weakness 4/5. Gait not checked.  Dysarthria noted. PSYCHIATRIC: The patient is alert and oriented x 3.  SKIN: No obvious rash, lesion, or ulcer.  LABORATORY PANEL:  Female CBC Recent Labs  Lab 11/22/18 1059  WBC 4.8  HGB 12.5  HCT 37.3  PLT 246   ------------------------------------------------------------------------------------------------------------------ Chemistries  Recent Labs  Lab 11/22/18 1059  NA 139  K 3.5  CL 105  CO2 24  GLUCOSE 130*  BUN 12  CREATININE 0.74  CALCIUM 9.2  AST 26  ALT 17  ALKPHOS 65  BILITOT 0.6   RADIOLOGY:  Dg Chest 2 View  Result Date: 11/22/2018 CLINICAL DATA:  History of lung tumor.  CVA. EXAM: CHEST - 2 VIEW COMPARISON:  August 16, 2017 FINDINGS: Tortuous thoracic aorta is stable. Stable cardiomegaly. No pneumothorax. The lungs are clear. IMPRESSION: No acute abnormalities identified. Electronically Signed   By: Dorise Bullion III M.D   On: 11/22/2018 17:53   Ct Head Wo Contrast  Result Date: 11/22/2018 CLINICAL DATA:  [place] last night with weakness. EXAM: CT HEAD WITHOUT CONTRAST TECHNIQUE: Contiguous axial images were obtained from the base of the skull through the vertex without intravenous contrast. COMPARISON:  09/10/2018 FINDINGS: Brain:  Redemonstration of a 2 cm residual or recurrent meningioma at the left frontal vertex. Mild-to-moderate vasogenic edema in the left frontal  lobe appears similar to the previous exams. No mass effect or midline shift. Chronic small-vessel ischemic changes are present throughout the cerebral hemispheric white matter. No sign of acute infarction, hemorrhage, hydrocephalus or extra-axial collection. Vascular: There is atherosclerotic calcification of the major vessels at the base of the brain. Skull: Left frontal craniotomy changes as seen previously. Sinuses/Orbits: Clear/normal Other: None IMPRESSION: No acute finding by CT. Known residual or recurrent meningioma at the left medial frontal vertex measuring 2 cm in size, with mild-to-moderate vasogenic edema in the left frontal lobe. No sign of acute infarction or hemorrhage. Chronic small-vessel ischemic changes throughout the cerebral hemispheric white matter. Electronically Signed   By: [name] Chimes M.D.   On: 11/22/2018 11:53   Mr Brain Wo Contrast  Result Date: 11/22/2018 CLINICAL DATA:  68 year old female status post fall yesterday. Weakness, altered mental status, slurred speech. EXAM: MRI HEAD WITHOUT CONTRAST MRA HEAD WITHOUT CONTRAST TECHNIQUE: Multiplanar, multiecho pulse sequences of the brain and surrounding structures were obtained without intravenous contrast. Angiographic images of the head were obtained using MRA technique without contrast. COMPARISON:  Head CT without contrast earlier today. Brain MRI 06/23/2018. FINDINGS: MRI HEAD FINDINGS Brain: No restricted diffusion to suggest acute infarction. No midline shift, ventriculomegaly, extra-axial fluid collection or acute intracranial hemorrhage. Cervicomedullary junction and pituitary are within normal limits. Vertex left parasagittal extra-axial meningioma again suspected and size estimated at 24 x 18 x 16 millimeters (AP by transverse by CC) in the absence of contrast. Mass effect on the left superior frontal gyrus appears stable, and there is continued regional vasogenic edema, no definite progression since October. Superimposed  patchy and scattered bilateral cerebral white matter T2 and FLAIR hyperintensity in a nonspecific configuration is stable. No chronic cerebral blood products identified. Signal in the deep gray matter nuclei, brainstem, and cerebellum remains normal. Vascular: Major intracranial vascular flow voids are stable. Skull and upper cervical spine: Previous left superior craniotomy. Visualized bone marrow signal is within normal limits. Negative visible Cervical spine. Sinuses/Orbits: Stable and negative. Other: Trace mastoid fluid is stable. Visible internal auditory structures appear normal. No acute scalp or face soft tissue finding. MRA HEAD FINDINGS Antegrade flow in the posterior circulation with dominant right vertebral artery. The non dominant left vertebral is diminutive and appears to functionally terminates in PICA. No distal vertebral or basilar artery stenosis despite irregularity. Patent AICA and SCA origins. Fetal type bilateral PCA origins. Bilateral PCA branches are patent with mild irregularity. Antegrade flow in both ICA siphons. Mild to moderate cavernous segment stenosis on the left (series 1024, image 16). No stenosis on the right. Ophthalmic and posterior communicating artery origins are normal. Patent carotid termini. Normal MCA and ACA origins. Anterior communicating artery is normal. The left ACA A2 segment appears dominant. Left MCA M1 segment bifurcates early. Visible left MCA branches are within normal limits. Right MCA M1 segment,, right MCA bifurcation and visible right MCA branches are within normal limits. IMPRESSION: 1. Negative for acute infarct. 2. Intracranial MRA is remarkable for intracranial atherosclerosis with mild to moderate cavernous Left ICA stenosis. 3. Grossly stable since October left vertex parasagittal Meningioma with vasogenic edema and mild mass effect. Meningioma size estimated at 2.4 centimeters in the absence of contrast. 4. Moderate chronic cerebral white matter  signal changes, most commonly due to small vessel disease. Electronically Signed   By: Herminio Heads.D.  On: 11/22/2018 19:16   US Carotid Bilateral (at [facility] And [facility] Only)  Result Date: 11/22/2018 CLINICAL DATA:  Stroke. EXAM: BILATERAL CAROTID DUPLEX ULTRASOUND TECHNIQUE: Pearline Cables scale imaging, color Doppler and duplex ultrasound were performed of bilateral carotid and vertebral arteries in the neck. COMPARISON:  Head CT obtained earlier today. FINDINGS: Criteria: Quantification of carotid stenosis is based on velocity parameters that correlate the residual internal carotid diameter with NASCET-based stenosis levels, using the diameter of the distal internal carotid lumen as the denominator for stenosis measurement. The following velocity measurements were obtained: RIGHT ICA: 137 cm/sec CCA: 88 cm/sec SYSTOLIC ICA/CCA RATIO:  1.6 ECA: 119 cm/sec LEFT ICA: 128 cm/sec CCA: 97 cm/sec SYSTOLIC ICA/CCA RATIO:  1.3 ECA: 162 cm/sec RIGHT CAROTID ARTERY: Plaque formation in the carotid bulb, proximal internal carotid artery and proximal external carotid artery. RIGHT VERTEBRAL ARTERY:  Normal antegrade flow. LEFT CAROTID ARTERY: Plaque formation in the carotid bulb and proximal internal carotid artery. LEFT VERTEBRAL ARTERY:  Normal antegrade flow. 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 Electronically Signed   By: Claudie Revering M.D.   On: 11/22/2018 18:34   Mr Jodene Nam Head/brain SJ Cm  Result Date: 11/22/2018 CLINICAL DATA:  68 year old female status post fall yesterday. Weakness, altered mental status, slurred speech. EXAM: MRI HEAD WITHOUT CONTRAST MRA HEAD WITHOUT CONTRAST TECHNIQUE: Multiplanar, multiecho pulse sequences of the brain and surrounding structures were obtained without intravenous contrast. Angiographic images of the head were obtained using MRA technique without contrast. COMPARISON:  Head CT without contrast earlier  today. Brain MRI 06/23/2018. FINDINGS: MRI HEAD FINDINGS Brain: No restricted diffusion to suggest acute infarction. No midline shift, ventriculomegaly, extra-axial fluid collection or acute intracranial hemorrhage. Cervicomedullary junction and pituitary are within normal limits. Vertex left parasagittal extra-axial meningioma again suspected and size estimated at 24 x 18 x 16 millimeters (AP by transverse by CC) in the absence of contrast. Mass effect on the left superior frontal gyrus appears stable, and there is continued regional vasogenic edema, no definite progression since October. Superimposed patchy and scattered bilateral cerebral white matter T2 and FLAIR hyperintensity in a nonspecific configuration is stable. No chronic cerebral blood products identified. Signal in the deep gray matter nuclei, brainstem, and cerebellum remains normal. Vascular: Major intracranial vascular flow voids are stable. Skull and upper cervical spine: Previous left superior craniotomy. Visualized bone marrow signal is within normal limits. Negative visible Cervical spine. Sinuses/Orbits: Stable and negative. Other: Trace mastoid fluid is stable. Visible internal auditory structures appear normal. No acute scalp or face soft tissue finding. MRA HEAD FINDINGS Antegrade flow in the posterior circulation with dominant right vertebral artery. The non dominant left vertebral is diminutive and appears to functionally terminates in PICA. No distal vertebral or basilar artery stenosis despite irregularity. Patent AICA and SCA origins. Fetal type bilateral PCA origins. Bilateral PCA branches are patent with mild irregularity. Antegrade flow in both ICA siphons. Mild to moderate cavernous segment stenosis on the left (series 1024, image 16). No stenosis on the right. Ophthalmic and posterior communicating artery origins are normal. Patent carotid termini. Normal MCA and ACA origins. Anterior communicating artery is normal. The left ACA A2  segment appears dominant. Left MCA M1 segment bifurcates early. Visible left MCA branches are within normal limits. Right MCA M1 segment,, right MCA bifurcation and visible right MCA branches are within normal limits. IMPRESSION: 1. Negative for acute infarct. 2. Intracranial MRA is remarkable for intracranial atherosclerosis with mild  to moderate cavernous Left ICA stenosis. 3. Grossly stable since October left vertex parasagittal Meningioma with vasogenic edema and mild mass effect. Meningioma size estimated at 2.4 centimeters in the absence of contrast. 4. Moderate chronic cerebral white matter signal changes, most commonly due to small vessel disease. Electronically Signed   By: Genevie Ann M.D.   On: 11/22/2018 19:16   ASSESSMENT AND PLAN:  Lauren [name]  is a 68 y.o. female with a known history of hyperlipidemia, hypertension, sleep apnea, CVA with residual deficit, focal seizures, mood disorder related to left frontal supratentorial meningioma status post resection (04/12/11), presenting to the emergency room status post fall, generalized weakness, right facial/arm/leg paresthesias numbness/tingling which led to fall from seated position. Patient did not lose consciousness, in the emergency room patient was found to have a troponin of 0.07 did increase to 0.09, CT of the head noted for residual meningioma with vasogenic edema, hospitalist asked to evaluate/treat, patient evaluated in the emergency room, only complaining of dry mouth, in no apparent distress, resting comfortably in bed, patient is now being admitted for acute right-sided paresthesias resulting in fall, incidentally found elevated troponins without chest pain.  IMPRESSION AND PLAN:  *Acute right face/arm/leg paresthesias With history of chronic CVA with left leg weakness, dysarthria Referred to the observation unit on our CVA/TIA protocol, check MRI of the brain, echocardiogram, carotid Dopplers for further evaluation, neurology/Dr.  Doy Mince consulted for expert opinion, PT/OT/speech therapy to evaluate/treat, aspiration/fall precautions, head of bed at 30 degrees, neurochecks per routine, and continue close medical monitoring Seen by Neuro NP Ouma, feels there is no evidence of CVA/TIA and her symptoms and mood disorder are resulting from her left meningioma. Recommends consulting neurosurgery for treatment plan.  Patient had an EEG - This EEG is consistent with a focal cerebral disturbance in the left hemisphere without definite seizure predisposition. Neurology aware and pending recs for possible Depakote initiation.   *Chronic residual meningioma CT head noted for residual meningioma with vasogenic edema Seizure precautions, Ativan as needed seizures, neurology consulted as patient may require AEDs for prophylactic seizure prevention, check EEG for further evaluation - awaiting recommendations from neurosurgery consult   *Acute incidentally found elevated troponins without chest pain Conservative medical management, could be due to muscle injury from her fall per Dr. Manuella Ghazi.  *Chronic obstructive sleep apnea CPAP at bedtime/as needed, respiratory therapy for settings  *Chronic diabetes mellitus type 2 Hold metformin while in house, sliding-scale insulin Accu-Cheks per routine Gabapentin discontinued for risk of contributing to paresthesias. Monitor for neuropathic pain.   *Chronic hypertension Stable Continue home regimen  *Chronic schizophrenia Stable Discontinued Haldol injections in the future per pharmacy - could be causing her paresthesias, reported parkinsonian symptoms. However suspect more contribution from her meningioma at this time.   Seen by SLP and OT who recommend [facility], [place] SLP Will also set patient up with [facility] RN, Aid  All the records are reviewed and case is discussed with Care Management/Social Worker. Management plans discussed with the patient and/or family and they are in  agreement.  CODE STATUS: DNR  TOTAL TIME TAKING CARE OF THIS PATIENT: 30 minutes.   More than 50% of the time was spent in counseling/coordination of care: YES  POSSIBLE D/C IN 2-3 DAYS, DEPENDING ON CLINICAL CONDITION.   Jovaughn Wojtaszek PA-C on 11/23/2018 at 8:20 AM  Between 7am to 6pm - Pager - [phone]  After 6 pm go to [email or web address] - [employer] [facility]  Office  [phone]  CC: Primary care physician; Steele Sizer, MD  Note: This dictation was prepared with Dragon dictation along with smaller phrase technology. Any transcriptional errors that result from this process are unintentional.

## 2018-11-23 NOTE — Evaluation (Addendum)
Speech Language Pathology Evaluation Patient Details Name: Lauren [name] MRN: [id number] DOB: July 25, 1951 Today's Date: 11/23/2018 Time: 1140-1240 SLP Time Calculation (min) (ACUTE ONLY): 60 min  Problem List:  Patient Active Problem List   Diagnosis Date Noted  . Paresthesias 11/22/2018  . Dry mouth 10/12/2018  . Hx of resection of meningioma 07/21/2018  . Tremor 07/21/2018  . Schizoaffective disorder ([place]) 12/29/2017  . Morbid obesity ([place]) 12/09/2017  . Meningioma ([place]) 11/25/2017  . Atherosclerosis of aorta ([place]) 11/25/2017  . Calcification of coronary artery 11/25/2017  . Schizophrenia ([place]) 11/04/2017  . Acid reflux 06/29/2015  . Diabetes mellitus type 2, controlled, without complications ([place]) 41/28/7867  . Cardiac murmur 06/29/2015  . Arthritis of knee, degenerative 06/28/2015  . Insomnia w/ sleep apnea 03/21/2015  . Anxiety disorder 03/21/2015  . Hypertension 03/21/2015  . HLD (hyperlipidemia) 03/21/2015  . Urge incontinence 03/21/2015   Past Medical History:  Past Medical History:  Diagnosis Date  . Anxiety   . Apnea, sleep 02/02/2014  . Awareness of heartbeats 02/02/2014  . Breathlessness on exertion 02/02/2014  . Diabetes mellitus   . Encounter for pre-employment examination 06/29/2015  . Excessive sweating 07/05/2015  . Fibromyalgia   . GERD (gastroesophageal reflux disease)   . Gravida 1 10/26/2015   1.    . Heart murmur   . Herniated disc   . Hyperlipidemia   . Hypertension   . Itch of skin 10/26/2015  . Lack of bladder control   . Lung tumor   . Rheumatoid arthritis ([place])   . Schizoaffective disorder ([place])   . Screening for cervical cancer 07/29/2017  . Seizure [facility])    after brain surgery 2012. last seizure 2013!  [name] Sex counseling 10/26/2015  . Sleep apnea   . 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 Stiffness of both knees 06/22/2015  . Stroke [facility])    Past Surgical History:  Past Surgical History:  Procedure  Laterality Date  . BRAIN TUMOR EXCISION  2012   benign  . CARDIAC CATHETERIZATION  2008  . CESAREAN SECTION    . CYST REMOVAL HAND    . LUNG LOBECTOMY  1977   benign tumor  . TOTAL KNEE ARTHROPLASTY Left 01/28/2017   Procedure: TOTAL KNEE ARTHROPLASTY;  Surgeon: Corky Mull, MD;  Location: [facility] ORS;  Service: Orthopedics;  Laterality: Left;   HPI:  Per admitting H&P: pt is a 68 y.o. female with a known history per below, presenting to the emergency room status post fall, generalized weakness, last night patient had refused to come to the emergency room for care, today patient complains of right facial/arm/leg paresthesias numbness/tingling which led to fall from seated position, patient did not lose consciousness, denies any new weakness, in the emergency room patient was found to have a troponin of 0.07 did increase to 0.09, CT of the head noted for residual meningioma with vasogenic edema, hospitalist asked to evaluate/treat, patient evaluated in the emergency room, only complaining of dry mouth, in no apparent distress, resting comfortably in bed, patient is now being admitted for acute right-sided paresthesias resulting in fall, incidentally found elevated troponins without chest pain.   Assessment / Plan / Recommendation Clinical Impression  ST consulted to assess pt's cognitive-linguistic function - administered the [facility] Cognitive Assessment (MoCA). Pt reported that she has been receiving Skilled ST services and "works on strategies to help w/ memory and concentration." Pt has received radiation to the brain for a frontoparietal meningioma (which remains large  per Neurology report). Pt has a history of Stroke and mod chronic cerebral white matter changes. With this assessment today, ST suspects pt appears to present w/ mild-mod cognitive-linguistic deficits, at baseline. ST suspects pt's score/outcome is near her baseline, based on this pt's self-report and chart review.  The MoCA  assesses an individual's visuospatial/executive function, object naming, memory (short-term and recall), sustained & alternating attention, language, abstraction, and orientation. Pt demonstrated strengths in the areas of visuospatial/executive function, object naming, sustained attention, language, and orientation - Pt correctly followed a number/letter sequencing pattern and correctly drew lines connecting pattern order, accurately copied a picture, and demonstrated [facility] sequencing & organization when drawing a clock face & setting correct time. Pt demonstrated accuracy in the area of picture/object naming and sustained attention (listening for a specific letter in a long sequence of scrambled letters). Pt repeated full sentences back to ST w/ 100% accuracy, demonstrating strong short-term memory w/ contextual information in sentence format. Important to note, pt demonstrated [facility] on reading tasks, as she read from dining menu and picked choices for meal order. Additionally, ST overheard phone conversation in which pt identified and problem-solved instructing family member to call hospital phone when cell phone battery was dying.   Pt exhibited min difficulty in the areas of alternating attention, memory recall, delayed recall, language fluency, and abstraction. However it is important to note, pt did benefit from repetition and categorical/verbal cues in all of the above areas. When given 5 random words to remember, pt required repetition x3. When assessing alternating attention (ex. Subtracting 7 from a previous number), pt required task repetition and additional processing time. During this task, pt exhibited min verbal frustration - was able to redirect w/ cues. Similar frustration was noted in the area of language fluency, where the pt was prompted to name as many objects starting with /s/ as possible within 1 minute - pt only able to name 6 w/o ST cues. Pt required mod- max cues (categorical cues/multiple  choice options) for all 5 words presented in earlier memory category.   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 Pt reports that current speech production is at her baseline and uses strategies of slowing down and articulating her words to be more clear (this is something she has been working on w/ [place], since last brain radiation treatment).  Recommend: Pt to continue to use strategies of contextual cues, increased processing time, written cues, and repetition.  Skilled ST services upon D/C to continue working on compensatory strategies for cognitive-linguistic and Dysarthria deficits noted. Pt and NSG agreed.    SLP Assessment  SLP Recommendation/Assessment: Patient needs continued Speech Lanaguage Pathology Services SLP Visit Diagnosis: Cognitive communication deficit (R41.841)    Follow Up Recommendations  Home health SLP;Other (comment)(TBD)    Frequency and Duration min 1 x/week  1 week      SLP Evaluation Cognition  Overall Cognitive Status: History of cognitive impairments - at baseline Arousal/Alertness: Awake/alert Orientation Level: Oriented X4 Attention: Focused;Sustained Focused Attention: Appears intact Sustained Attention: Appears intact Memory: Impaired Memory Impairment: Storage deficit;Retrieval deficit;Decreased recall of new information;Decreased long term memory Decreased Long Term Memory: Verbal basic;Functional basic Awareness: Appears intact Problem Solving: Appears intact Executive Function: Reasoning;Sequencing;Organizing;Self Correcting Reasoning: Appears intact Sequencing: Appears intact Organizing: Appears intact Self Correcting: Appears intact Behaviors: Verbal agitation;Perseveration Safety/Judgment: Appears intact       Comprehension  Auditory Comprehension Overall Auditory Comprehension: Appears within functional limits for tasks assessed Yes/No Questions: Within  Functional Limits Commands: Within Functional Limits Conversation: Simple Interfering Components: Attention;Processing speed;Working Field seismologist: Civil engineer, contracting Discrimination: Within [employer] Reading Comprehension Reading Status: Within funtional limits    Expression Expression Primary Mode of Expression: Verbal Verbal Expression Overall Verbal Expression: Appears within functional limits for tasks assessed Initiation: No impairment Automatic Speech: Name;Social Response;Day of week;Month of year Level of Generative/Spontaneous Verbalization: Conversation Repetition: Impaired Level of Impairment: Word level Naming: No impairment Pragmatics: No impairment Interfering Components: Attention Effective Techniques: Semantic cues;Phonemic cues;Articulatory cues Non-Verbal Means of Communication: Other (comment)(N/A) Written Expression Dominant Hand: Right Written Expression: Within Functional Limits   Oral / Motor  Oral Motor/Sensory Function Overall Oral Motor/Sensory Function: Within functional limits(mild decreased R facial sensation) Motor Speech Overall Motor Speech: Appears within functional limits for tasks assessed(pt reports baseline) Respiration: Within functional limits Phonation: Normal Resonance: Within functional limits Articulation: Within functional limitis Intelligibility: Intelligible Motor Planning: Witnin functional limits Motor Speech Errors: Aware Interfering Components: Premorbid status Effective Techniques: Slow rate;Over-articulate;Pause;Pacing   [place], Graduate Student SLP 11/23/2018, 1:47 PM

## 2018-11-23 NOTE — Progress Notes (Signed)
eeg complete.

## 2018-11-23 NOTE — Progress Notes (Signed)
*  PRELIMINARY RESULTS* Echocardiogram 2D Echocardiogram has been performed.  [place] Shore 11/23/2018, 11:30 AM

## 2018-11-23 NOTE — Consult Note (Addendum)
Reason for Consult: Referring Physician: Max Sane, MD  CC: Right-sided weakness and numbness, right facial numbness and weakness, fall  HPI: Lauren [name] is an 68 y.o. 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  Per ED reports, patient apparently fell the previous night due to generalized weakness however she refused to go to the ED for further evaluation.  Yesterday she complained of right facial/arm/leg paresthesia causing her to fall from seated position without loss of consciousness.  She was therefore brought in the ED for further evaluation and management. On arrival to the ED, she was afebrile with blood pressure 168/125 mm Hg and pulse rate 80 beats/min, 97% on RA. She was noted with right sided weakness and dysarthria; She was alert and oriented x4, and he did not demonstrate any memory deficits.  Initial labs revealed elevated troponin of 0.07, complete blood count (CBC), comprehensive metabolic panel ( CMP) urinalysis, drug of abuse screen were unremarkable ; an insidious infectious workup was initiated, including rapid HIV that were ultimately negative. ECG showed sinus rhythm of 59 beats per minute and the chest X-ray was normal. A non-contrast head CT showed no acute finding.  Known residual meningioma at the left medial frontal vertex measuring 2 cm in size with vasogenic edema noted in the left frontal lobe. Follow-up MRI of the brain again demonstrated no acute infarct.  Left vertex parasagittal meningioma with vasogenic edema and mild mass-effect again noted.  Patient was admitted for further work-up and management.  Past Medical History:  Diagnosis Date  .  Anxiety   . Apnea, sleep 02/02/2014  . Awareness of heartbeats 02/02/2014  . Breathlessness on exertion 02/02/2014  . Diabetes mellitus   . Encounter for pre-employment examination 06/29/2015  . Excessive sweating 07/05/2015  . Fibromyalgia   . GERD (gastroesophageal reflux disease)   . Gravida 1 10/26/2015   1.    . Heart murmur   . Herniated disc   . Hyperlipidemia   . Hypertension   . Itch of skin 10/26/2015  . Lack of bladder control   . Lung tumor   . Rheumatoid arthritis ([place])   . Schizoaffective disorder ([place])   . Screening for cervical cancer 07/29/2017  . Seizure [facility])    after brain surgery 2012. last seizure 2013!  [name] Sex counseling 10/26/2015  . Sleep apnea   . 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 Stiffness of both knees 06/22/2015  . Stroke [facility])     Past Surgical History:  Procedure Laterality Date  . BRAIN TUMOR EXCISION  2012   benign  . CARDIAC CATHETERIZATION  2008  . CESAREAN SECTION    . CYST REMOVAL HAND    . LUNG LOBECTOMY  1977   benign tumor  . TOTAL KNEE ARTHROPLASTY Left 01/28/2017   Procedure: TOTAL KNEE ARTHROPLASTY;  Surgeon: Corky Mull, MD;  Location: [facility] ORS;  Service: Orthopedics;  Laterality: Left;    Family History  Problem Relation Age of Onset  . Heart disease Brother   . Depression Mother   . Heart attack Mother   . Stroke Mother   . Alcohol abuse Father   . Stroke Father   . Diabetes Sister   . Diabetes Sister   . Stomach  cancer Sister   . Kidney disease Sister   . COPD Brother   . Lung cancer Brother   . Diabetes Brother     Social History:  reports that she has never smoked. She has never used smokeless tobacco. She reports that she does not drink alcohol or use drugs.  Allergies  Allergen Reactions  . Percocet [Oxycodone-Acetaminophen] Diarrhea, Nausea And Vomiting and Nausea Only  . Tramadol Hcl Diarrhea, Nausea And Vomiting and Nausea Only  . Vicodin [Hydrocodone-Acetaminophen]  Diarrhea, Nausea And Vomiting and Nausea Only    Medications:  I have reviewed the patient's current medications. Prior to Admission:  Medications Prior to Admission  Medication Sig Dispense Refill Last Dose  . Artificial Saliva (BIOTENE DRY MOUTH MOISTURIZING) SOLN Use as directed 1 application in the mouth or throat 2 (two) times daily. 44.3 mL 5 Taking  . Ascorbic Acid (VITAMIN C) 500 MG CHEW Chew 500 mg by mouth daily as needed (cold/flu symptoms).    Unknown at PRN  . aspirin EC 81 MG tablet Take 1 tablet (81 mg total) by mouth daily before breakfast. 30 tablet 5 11/22/2018 at 0900  . atorvastatin (LIPITOR) 40 MG tablet Take 1 tablet (40 mg total) by mouth daily at 6 PM. 30 tablet 0 11/21/2018 at 1900  . benztropine (COGENTIN) 1 MG tablet Take 1 mg by mouth 2 (two) times daily.    11/22/2018 at 0900  . Dentifrices (BIOTENE DRY MOUTH GENTLE) PSTE Place 1 application onto teeth 2 (two) times daily. 121.9 g 5 Taking  . Elastic Bandages & Supports (KNEE COMPRESSION SLEEVE/L/XL) MISC 2 each by Does not apply route as needed. Wear during day; take off at night 2 each 0 Taking  . escitalopram (LEXAPRO) 10 MG tablet Take 10 mg by mouth daily.    11/22/2018 at 0900  . gabapentin (NEURONTIN) 100 MG capsule Take 100 mg by mouth 2 (two) times daily.   11/22/2018 at 0900  . haloperidol decanoate (HALDOL DECANOATE) 100 MG/ML injection Inject 1 mL (100 mg total) into the muscle every 28 (twenty-eight) days. Next injection due on 01/27/18 1 mL 0 As directed at As directed  . hydrochlorothiazide (HYDRODIURIL) 25 MG tablet Take 1 tablet (25 mg total) by mouth daily. 30 tablet 0 11/22/2018 at 0900  . ibuprofen (ADVIL,MOTRIN) 200 MG tablet Take 600 mg by mouth every 8 (eight) hours as needed (for knee pain.).   Unknown at PRN  . lisinopril (PRINIVIL,ZESTRIL) 20 MG tablet Take 1 tablet (20 mg total) by mouth daily. 30 tablet 0 11/22/2018 at 0900  . metFORMIN (GLUCOPHAGE) 500 MG tablet Take 1 tablet (500 mg total) by  mouth 2 (two) times daily with a meal. 30 tablet 0 11/22/2018 at 0900  . omeprazole (PRILOSEC) 20 MG capsule Take 1 capsule (20 mg total) by mouth 2 (two) times daily. 180 capsule 0 11/22/2018 at 0900  . traZODone (DESYREL) 50 MG tablet Take 1 tablet (50 mg total) by mouth at bedtime. 30 tablet 0 11/21/2018 at 2000   Scheduled: . antiseptic oral rinse  15 mL Mouth Rinse BID  . aspirin  300 mg Rectal Daily   Or  . aspirin EC  325 mg Oral Daily  . atorvastatin  40 mg Oral q1800  . benztropine  1 mg Oral BID  . enoxaparin (LOVENOX) injection  40 mg Subcutaneous BID  . escitalopram  10 mg Oral Daily  . hydrochlorothiazide  25 mg Oral Daily  . insulin aspart  0-20 Units Subcutaneous TID  WC  . insulin aspart  0-5 Units Subcutaneous QHS  . lisinopril  20 mg Oral Daily  . pantoprazole  40 mg Oral Daily  . traZODone  50 mg Oral QHS    ROS: History obtained from the patient   General ROS: negative for - chills, fatigue, fever, night sweats, weight gain or weight loss Psychological ROS: negative for - behavioral disorder, hallucinations, memory difficulties, mood swings or suicidal ideation Ophthalmic ROS: negative for - blurry vision, double vision, eye pain or loss of vision ENT ROS: negative for - epistaxis, nasal discharge, oral lesions, sore throat, tinnitus or vertigo Allergy and Immunology ROS: negative for - hives or itchy/watery eyes Hematological and Lymphatic ROS: negative for - bleeding problems, bruising or swollen lymph nodes Endocrine ROS: negative for - galactorrhea, hair pattern changes, polydipsia/polyuria or temperature intolerance Respiratory ROS: negative for - cough, hemoptysis, shortness of breath or wheezing Cardiovascular ROS: negative for - chest pain, dyspnea on exertion, edema or irregular heartbeat Gastrointestinal ROS: negative for - abdominal pain, diarrhea, hematemesis, nausea/vomiting or stool incontinence Genito-Urinary ROS: negative for - dysuria, hematuria,  incontinence or urinary frequency/urgency Musculoskeletal ROS: negative for - joint swelling or muscular weakness Neurological ROS: as noted in HPI Dermatological ROS: negative for rash and skin lesion changes  Physical Examination: Blood pressure 133/84, pulse 79, temperature 98.6 F (37 C), temperature source Axillary, resp. rate 16, height 5\' 5"  (1.651 m), weight 110.9 kg, SpO2 100 %.  HEENT-  Normocephalic, no lesions, without obvious abnormality.  Normal external eye and conjunctiva.  Normal TM's bilaterally.  Normal auditory canals and external ears. Normal external nose, mucus membranes and septum.  Normal pharynx. Cardiovascular- S1, S2 normal, pulses palpable throughout   Lungs- chest clear, no wheezing, rales, normal symmetric air entry Abdomen- soft, non-tender; bowel sounds normal; no masses,  no organomegaly Extremities- no edema Lymph-no adenopathy palpable Musculoskeletal-no joint tenderness, deformity or swelling Skin-warm and dry, no hyperpigmentation, vitiligo, or suspicious lesions  Neurological Exam   Mental Status: Alert, oriented, thought content appropriate.  Speech slurred  without evidence of aphasia.  Able to follow 3 step commands without difficulty. Attention span and concentration seemed appropriate  Cranial Nerves: II: Discs flat bilaterally; Visual fields grossly normal, pupils equal, round, reactive to light and accommodation III,IV, VI: ptosis not present, extra-ocular motions intact bilaterally V,VII: smile symmetric, facial light touch sensation intact VIII: hearing normal bilaterally IX,X: gag reflex present XI: bilateral shoulder shrug XII: midline tongue extension Motor: Right :  Upper extremity   5/5 Without pronator drift      Left: Upper extremity   5/5 without pronator drift Right:   Lower extremity   4+/5                                          Left: Lower extremity   5/5 Tone and bulk:normal tone throughout; no atrophy noted Sensory:  Pinprick and light touch intact bilaterally Deep Tendon Reflexes: 2+ and symmetric throughout Plantars: Right: mute                              Left: mute Cerebellar: Finger-to-nose testing intact bilaterally. Heel to shin testing normal bilaterally Gait: not tested due to safety concerns  Data Reviewed  Laboratory Studies:   Basic Metabolic Panel: Recent Labs  Lab 11/22/18 1059  NA 139  K 3.5  CL 105  CO2 24  GLUCOSE 130*  BUN 12  CREATININE 0.74  CALCIUM 9.2    Liver Function Tests: Recent Labs  Lab 11/22/18 1059  AST 26  ALT 17  ALKPHOS 65  BILITOT 0.6  PROT 7.1  ALBUMIN 3.9   No results for input(s): LIPASE, AMYLASE in the last 168 hours. No results for input(s): AMMONIA in the last 168 hours.  CBC: Recent Labs  Lab 11/22/18 1059  WBC 4.8  NEUTROABS 3.0  HGB 12.5  HCT 37.3  MCV 93.5  PLT 246    Cardiac Enzymes: Recent Labs  Lab 11/22/18 1059 11/22/18 1250 11/23/18 0750 11/23/18 0825  TROPONINI 0.07* 0.09* <0.03 <0.03    BNP: Invalid input(s): POCBNP  CBG: Recent Labs  Lab 11/22/18 1727 11/22/18 2138 11/23/18 0731  GLUCAP 83 138* 103*    Microbiology: Results for orders placed or performed in visit on 12/09/17  Urine Culture     Status: None   Collection Time: 12/09/17  9:53 AM  Result Value Ref Range Status   MICRO NUMBER: [id number]  Final   SPECIMEN QUALITY: ADEQUATE  Final   Sample Source URINE  Final   STATUS: FINAL  Final   Result: No Growth  Final    Coagulation Studies: No results for input(s): LABPROT, INR in the last 72 hours.  Urinalysis:  Recent Labs  Lab 11/23/18 0620  COLORURINE YELLOW*  LABSPEC 1.014  PHURINE 7.0  GLUCOSEU NEGATIVE  HGBUR NEGATIVE  BILIRUBINUR NEGATIVE  KETONESUR NEGATIVE  PROTEINUR NEGATIVE  NITRITE NEGATIVE  LEUKOCYTESUR NEGATIVE    Lipid Panel:     Component Value Date/Time   CHOL 157 11/23/2018 0527   CHOL 203 (H) 06/27/2016 0902   TRIG 108 11/23/2018 0527   HDL 38 (L)  11/23/2018 0527   HDL 49 06/27/2016 0902   CHOLHDL 4.1 11/23/2018 0527   VLDL 22 11/23/2018 0527   LDLCALC 97 11/23/2018 0527   LDLCALC 138 (H) 06/27/2016 0902    HgbA1C:  Lab Results  Component Value Date   HGBA1C 6.4 (H) 11/23/2018    Urine Drug Screen:      Component Value Date/Time   LABOPIA NONE DETECTED 12/28/2017 2144   COCAINSCRNUR NONE DETECTED 12/28/2017 2144   LABBENZ NONE DETECTED 12/28/2017 2144   AMPHETMU NONE DETECTED 12/28/2017 2144   THCU NONE DETECTED 12/28/2017 2144   LABBARB NONE DETECTED 12/28/2017 2144    Alcohol Level: No results for input(s): ETH in the last 168 hours.  Other results: EKG: normal EKG, normal sinus rhythm, unchanged from previous tracings. Vent. rate 59 BPM PR interval * ms QRS duration 87 ms QT/QTc 485/481 ms P-R-T axes 70 18 50  Imaging: Dg Chest 2 View  Result Date: 11/22/2018 CLINICAL DATA:  History of lung tumor.  CVA. EXAM: CHEST - 2 VIEW COMPARISON:  August 16, 2017 FINDINGS: Tortuous thoracic aorta is stable. Stable cardiomegaly. No pneumothorax. The lungs are clear. IMPRESSION: No acute abnormalities identified. Electronically Signed   By: Dorise Bullion III M.D   On: 11/22/2018 17:53   Ct Head Wo Contrast  Result Date: 11/22/2018 CLINICAL DATA:  [place] last night with weakness. EXAM: CT HEAD WITHOUT CONTRAST TECHNIQUE: Contiguous axial images were obtained from the base of the skull through the vertex without intravenous contrast. COMPARISON:  09/10/2018 FINDINGS: Brain: Redemonstration of a 2 cm residual or recurrent meningioma at the left frontal vertex. Mild-to-moderate vasogenic edema in the left frontal lobe appears similar to the previous exams.  No mass effect or midline shift. Chronic small-vessel ischemic changes are present throughout the cerebral hemispheric white matter. No sign of acute infarction, hemorrhage, hydrocephalus or extra-axial collection. Vascular: There is atherosclerotic calcification of the major  vessels at the base of the brain. Skull: Left frontal craniotomy changes as seen previously. Sinuses/Orbits: Clear/normal Other: None IMPRESSION: No acute finding by CT. Known residual or recurrent meningioma at the left medial frontal vertex measuring 2 cm in size, with mild-to-moderate vasogenic edema in the left frontal lobe. No sign of acute infarction or hemorrhage. Chronic small-vessel ischemic changes throughout the cerebral hemispheric white matter. Electronically Signed   By: [name] Chimes M.D.   On: 11/22/2018 11:53   Mr Brain Wo Contrast  Result Date: 11/22/2018 CLINICAL DATA:  68 year old female status post fall yesterday. Weakness, altered mental status, slurred speech. EXAM: MRI HEAD WITHOUT CONTRAST MRA HEAD WITHOUT CONTRAST TECHNIQUE: Multiplanar, multiecho pulse sequences of the brain and surrounding structures were obtained without intravenous contrast. Angiographic images of the head were obtained using MRA technique without contrast. COMPARISON:  Head CT without contrast earlier today. Brain MRI 06/23/2018. FINDINGS: MRI HEAD FINDINGS Brain: No restricted diffusion to suggest acute infarction. No midline shift, ventriculomegaly, extra-axial fluid collection or acute intracranial hemorrhage. Cervicomedullary junction and pituitary are within normal limits. Vertex left parasagittal extra-axial meningioma again suspected and size estimated at 24 x 18 x 16 millimeters (AP by transverse by CC) in the absence of contrast. Mass effect on the left superior frontal gyrus appears stable, and there is continued regional vasogenic edema, no definite progression since October. Superimposed patchy and scattered bilateral cerebral white matter T2 and FLAIR hyperintensity in a nonspecific configuration is stable. No chronic cerebral blood products identified. Signal in the deep gray matter nuclei, brainstem, and cerebellum remains normal. Vascular: Major intracranial vascular flow voids are stable. Skull and  upper cervical spine: Previous left superior craniotomy. Visualized bone marrow signal is within normal limits. Negative visible Cervical spine. Sinuses/Orbits: Stable and negative. Other: Trace mastoid fluid is stable. Visible internal auditory structures appear normal. No acute scalp or face soft tissue finding. MRA HEAD FINDINGS Antegrade flow in the posterior circulation with dominant right vertebral artery. The non dominant left vertebral is diminutive and appears to functionally terminates in PICA. No distal vertebral or basilar artery stenosis despite irregularity. Patent AICA and SCA origins. Fetal type bilateral PCA origins. Bilateral PCA branches are patent with mild irregularity. Antegrade flow in both ICA siphons. Mild to moderate cavernous segment stenosis on the left (series 1024, image 16). No stenosis on the right. Ophthalmic and posterior communicating artery origins are normal. Patent carotid termini. Normal MCA and ACA origins. Anterior communicating artery is normal. The left ACA A2 segment appears dominant. Left MCA M1 segment bifurcates early. Visible left MCA branches are within normal limits. Right MCA M1 segment,, right MCA bifurcation and visible right MCA branches are within normal limits. IMPRESSION: 1. Negative for acute infarct. 2. Intracranial MRA is remarkable for intracranial atherosclerosis with mild to moderate cavernous Left ICA stenosis. 3. Grossly stable since October left vertex parasagittal Meningioma with vasogenic edema and mild mass effect. Meningioma size estimated at 2.4 centimeters in the absence of contrast. 4. Moderate chronic cerebral white matter signal changes, most commonly due to small vessel disease. Electronically Signed   By: Genevie Ann M.D.   On: 11/22/2018 19:16   US Carotid Bilateral (at [facility] And [facility] Only)  Result Date: 11/22/2018 CLINICAL DATA:  Stroke. EXAM: BILATERAL CAROTID DUPLEX ULTRASOUND TECHNIQUE:  Gray scale imaging, color Doppler and duplex  ultrasound were performed of bilateral carotid and vertebral arteries in the neck. COMPARISON:  Head CT obtained earlier today. FINDINGS: Criteria: Quantification of carotid stenosis is based on velocity parameters that correlate the residual internal carotid diameter with NASCET-based stenosis levels, using the diameter of the distal internal carotid lumen as the denominator for stenosis measurement. The following velocity measurements were obtained: RIGHT ICA: 137 cm/sec CCA: 88 cm/sec SYSTOLIC ICA/CCA RATIO:  1.6 ECA: 119 cm/sec LEFT ICA: 128 cm/sec CCA: 97 cm/sec SYSTOLIC ICA/CCA RATIO:  1.3 ECA: 162 cm/sec RIGHT CAROTID ARTERY: Plaque formation in the carotid bulb, proximal internal carotid artery and proximal external carotid artery. RIGHT VERTEBRAL ARTERY:  Normal antegrade flow. LEFT CAROTID ARTERY: Plaque formation in the carotid bulb and proximal internal carotid artery. LEFT VERTEBRAL ARTERY:  Normal antegrade flow. 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 Electronically Signed   By: Claudie Revering M.D.   On: 11/22/2018 18:34   Mr Jodene Nam Head/brain HQ Cm  Result Date: 11/22/2018 CLINICAL DATA:  68 year old female status post fall yesterday. Weakness, altered mental status, slurred speech. EXAM: MRI HEAD WITHOUT CONTRAST MRA HEAD WITHOUT CONTRAST TECHNIQUE: Multiplanar, multiecho pulse sequences of the brain and surrounding structures were obtained without intravenous contrast. Angiographic images of the head were obtained using MRA technique without contrast. COMPARISON:  Head CT without contrast earlier today. Brain MRI 06/23/2018. FINDINGS: MRI HEAD FINDINGS Brain: No restricted diffusion to suggest acute infarction. No midline shift, ventriculomegaly, extra-axial fluid collection or acute intracranial hemorrhage. Cervicomedullary junction and pituitary are within normal limits. Vertex left parasagittal extra-axial  meningioma again suspected and size estimated at 24 x 18 x 16 millimeters (AP by transverse by CC) in the absence of contrast. Mass effect on the left superior frontal gyrus appears stable, and there is continued regional vasogenic edema, no definite progression since October. Superimposed patchy and scattered bilateral cerebral white matter T2 and FLAIR hyperintensity in a nonspecific configuration is stable. No chronic cerebral blood products identified. Signal in the deep gray matter nuclei, brainstem, and cerebellum remains normal. Vascular: Major intracranial vascular flow voids are stable. Skull and upper cervical spine: Previous left superior craniotomy. Visualized bone marrow signal is within normal limits. Negative visible Cervical spine. Sinuses/Orbits: Stable and negative. Other: Trace mastoid fluid is stable. Visible internal auditory structures appear normal. No acute scalp or face soft tissue finding. MRA HEAD FINDINGS Antegrade flow in the posterior circulation with dominant right vertebral artery. The non dominant left vertebral is diminutive and appears to functionally terminates in PICA. No distal vertebral or basilar artery stenosis despite irregularity. Patent AICA and SCA origins. Fetal type bilateral PCA origins. Bilateral PCA branches are patent with mild irregularity. Antegrade flow in both ICA siphons. Mild to moderate cavernous segment stenosis on the left (series 1024, image 16). No stenosis on the right. Ophthalmic and posterior communicating artery origins are normal. Patent carotid termini. Normal MCA and ACA origins. Anterior communicating artery is normal. The left ACA A2 segment appears dominant. Left MCA M1 segment bifurcates early. Visible left MCA branches are within normal limits. Right MCA M1 segment,, right MCA bifurcation and visible right MCA branches are within normal limits. IMPRESSION: 1. Negative for acute infarct. 2. Intracranial MRA is remarkable for intracranial  atherosclerosis with mild to moderate cavernous Left ICA stenosis. 3. Grossly stable since October left vertex parasagittal Meningioma with vasogenic edema and mild mass effect. Meningioma size estimated at  2.4 centimeters in the absence of contrast. 4. Moderate chronic cerebral white matter signal changes, most commonly due to small vessel disease. Electronically Signed   By: Genevie Ann M.D.   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 MRI of the brain reviewed and shows left vertex parasagittal meningioma with vasogenic edema and mild mass-effect likely the cause of presenting symptoms given location and distribution of the meningioma.  Patient's been complaining of intermittent symptoms of numbness in her right face, arm and leg as early as last year.  She was evaluated by neurosurgery on 04/21/2018 and a repeat CT head at that time was concerning for interval enlargement of the meningioma.  She was offered resection but patient declined at that time. Given that her meningioma had grown she was given the option to perform stereotactic radiation and was referred to radiation oncology at [facility] which she completed.  Recommendation: 1.  At this point it would be beneficial for the patient's to be evaluated by neurosurgery for further treatment recommendation if patient would like to proceed.  She was offered resection at last visit however she declined but had proceeded with stereotactic radiation.  She was to follow-up with neurosurgery in 6 months from 04/21/2018. 2.  EEG pending if abnormal would consider Depakote given her hx of seizures not on  medication and continued to follow with her outpatient neurology   This patient was staffed with Dr. Irish Elders, Alease Frame who personally evaluated patient, reviewed documentation and agreed with assessment and plan of care as above.  Rufina Falco, DNP, FNP-BC Board certified Nurse Practitioner Neurology Department  11/23/2018, 9:43 AM

## 2018-11-24 DIAGNOSIS — E119 Type 2 diabetes mellitus without complications: Secondary | ICD-10-CM | POA: Diagnosis not present

## 2018-11-24 DIAGNOSIS — G4733 Obstructive sleep apnea (adult) (pediatric): Secondary | ICD-10-CM | POA: Diagnosis not present

## 2018-11-24 DIAGNOSIS — R202 Paresthesia of skin: Secondary | ICD-10-CM | POA: Diagnosis not present

## 2018-11-24 DIAGNOSIS — R569 Unspecified convulsions: Secondary | ICD-10-CM

## 2018-11-24 DIAGNOSIS — W19XXXA Unspecified fall, initial encounter: Secondary | ICD-10-CM | POA: Diagnosis not present

## 2018-11-24 LAB — HIV ANTIBODY (ROUTINE TESTING W REFLEX): HIV Screen 4th Generation wRfx: NONREACTIVE

## 2018-11-24 LAB — BASIC METABOLIC PANEL
Anion gap: 8 (ref 5–15)
BUN: 13 mg/dL (ref 8–23)
CO2: 25 mmol/L (ref 22–32)
Calcium: 9.4 mg/dL (ref 8.9–10.3)
Chloride: 107 mmol/L (ref 98–111)
Creatinine, Ser: 0.68 mg/dL (ref 0.44–1.00)
GFR calc Af Amer: >60 mL/min (ref >60)
GFR calc non Af Amer: >60 mL/min (ref >60)
Glucose, Bld: 111 mg/dL — ABNORMAL HIGH (ref 70–99)
Potassium: 3.5 mmol/L (ref 3.5–5.1)
Sodium: 140 mmol/L (ref 135–145)

## 2018-11-24 LAB — CBC
HCT: 35.2 % — ABNORMAL LOW (ref 36.0–46.0)
Hemoglobin: 11.7 g/dL — ABNORMAL LOW (ref 12.0–15.0)
MCH: 31.5 pg (ref 26.0–34.0)
MCHC: 33.2 g/dL (ref 30.0–36.0)
MCV: 94.6 fL (ref 80.0–100.0)
Platelets: 230 10*3/uL (ref 150–400)
RBC: 3.72 MIL/uL — ABNORMAL LOW (ref 3.87–5.11)
RDW: 14.1 % (ref 11.5–15.5)
WBC: 6.1 10*3/uL (ref 4.0–10.5)
nRBC: 0 % (ref 0.0–0.2)

## 2018-11-24 LAB — GLUCOSE, CAPILLARY
Glucose-Capillary: 110 mg/dL — ABNORMAL HIGH (ref 70–99)
Glucose-Capillary: 94 mg/dL (ref 70–99)

## 2018-11-24 MED ORDER — DIVALPROEX SODIUM 500 MG PO DR TAB: 500.0000 mg | DELAYED_RELEASE_TABLET | Freq: Two times a day (BID) | ORAL | 0 refills | Status: DC

## 2018-11-24 NOTE — Progress Notes (Signed)
Brief ZOX:WRUEAV Lauren [name] is an 68 y.o. 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  Per ED reports, patient apparently fell the previous night due to generalized weakness however she refused to go to the ED for further evaluation.  Yesterday she complained of right facial/arm/leg paresthesia causing her to fall from seated position without loss of consciousness.  She was therefore brought in the ED for further evaluation and management. On arrival to the ED, she was afebrile with blood pressure 168/125 mm Hg and pulse rate 80 beats/min, 97% on RA. She was noted with right sided weakness and dysarthria; She was alert and oriented x4, and he did not demonstrate any memory deficits.  Initial labs revealed elevated troponin of 0.07, complete blood count (CBC), comprehensive metabolic panel ( CMP) urinalysis, drug of abuse screen were unremarkable ; an insidious infectious workup was initiated, including rapid HIV that were ultimately negative. ECG showed sinus rhythm of 59 beats per minute and the chest X-ray was normal. A non-contrast head CT showed no acute finding.  Known residual meningioma at the left medial frontal vertex measuring 2 cm in size with vasogenic edema noted in the left frontal lobe. Follow-up MRI of the brain again demonstrated no acute infarct.  Left vertex parasagittal meningioma with vasogenic edema and mild mass-effect again noted.  Patient was admitted for further work-up and management.  Subjective: Patient reports intermittent episodes of numbness and weakness but much better that on presentation.  Objective: Current vital signs: BP 135/82 (BP Location: Left Arm)    Pulse 71   Temp 97.8 F (36.6 C) (Oral)   Resp 18   Ht 5\' 5"  (1.651 m)   Wt 110.9 kg   SpO2 97%   BMI 40.67 kg/m  Vital signs in last 24 hours: Temp:  [97.2 F (36.2 C)-97.8 F (36.6 C)] 97.8 F (36.6 C) (03/17 0432) Pulse Rate:  [68-77] 71 (03/17 0852) Resp:  [16-18] 18 (03/17 0432) BP: (128-154)/(63-97) 135/82 (03/17 0852) SpO2:  [94 %-99 %] 97 % (03/17 0432)  Intake/Output from previous day: 03/16 0701 - 03/17 0700 In: 720 [P.O.:720] Out: -  Intake/Output this shift: Total I/O In: 240 [P.O.:240] Out: -  Nutritional status:  Diet Order            Diet - low sodium heart healthy        Diet Carb Modified Fluid consistency: Thin; Room service appropriate? Yes  Diet effective now             Neurological Exam  Mental Status: Alert, oriented, thought content appropriate. Speech slurred  without evidence of aphasia. Able to follow 3 step commands without difficulty. Attention span and concentration seemed appropriate  Cranial Nerves: II: Discs flat bilaterally; Visual fields grossly normal, pupils equal, round, reactive to light and accommodation III,IV, VI: ptosis not present, extra-ocular motions intact bilaterally V,VII: smile symmetric, facial light touch sensationintact VIII: hearing normal bilaterally IX,X: gag reflex present XI: bilateral shoulder shrug XII: midline tongue extension Motor: Right :Upper extremity 5/5Without pronator driftLeft: Upper extremity 5/5 without pronator drift Right:Lower extremity 4+/5Left: Lower extremity 5/5 Tone and bulk:normal tone throughout; no atrophy noted Sensory: Pinprick and light touchintact bilaterally Deep Tendon Reflexes: 2+ and symmetric throughout Plantars: Right:muteLeft: mute Cerebellar: Finger-to-nosetesting intact bilaterally.Heel  to shin testing normal bilaterally Gait: not tested due to safety  concerns  Data Reviewed  Lab Results: Basic Metabolic Panel: Recent Labs  Lab 11/22/18 1059 11/24/18 0314  NA 139 140  K 3.5 3.5  CL 105 107  CO2 24 25  GLUCOSE 130* 111*  BUN 12 13  CREATININE 0.74 0.68  CALCIUM 9.2 9.4    Liver Function Tests: Recent Labs  Lab 11/22/18 1059  AST 26  ALT 17  ALKPHOS 65  BILITOT 0.6  PROT 7.1  ALBUMIN 3.9   No results for input(s): LIPASE, AMYLASE in the last 168 hours. No results for input(s): AMMONIA in the last 168 hours.  CBC: Recent Labs  Lab 11/22/18 1059 11/24/18 0314  WBC 4.8 6.1  NEUTROABS 3.0  --   HGB 12.5 11.7*  HCT 37.3 35.2*  MCV 93.5 94.6  PLT 246 230    Cardiac Enzymes: Recent Labs  Lab 11/22/18 1250 11/23/18 0750 11/23/18 0825 11/23/18 1339 11/23/18 2000  TROPONINI 0.09* <0.03 <0.03 <0.03 <0.03    Lipid Panel: Recent Labs  Lab 11/23/18 0527  CHOL 157  TRIG 108  HDL 38*  CHOLHDL 4.1  VLDL 22  LDLCALC 97    CBG: Recent Labs  Lab 11/23/18 1147 11/23/18 1658 11/23/18 2204 11/24/18 0806 11/24/18 1144  GLUCAP 121* 104* 123* 110* 94    Microbiology: Results for orders placed or performed in visit on 12/09/17  Urine Culture     Status: None   Collection Time: 12/09/17  9:53 AM  Result Value Ref Range Status   MICRO NUMBER: [id number]  Final   SPECIMEN QUALITY: ADEQUATE  Final   Sample Source URINE  Final   STATUS: FINAL  Final   Result: No Growth  Final    Coagulation Studies: No results for input(s): LABPROT, INR in the last 72 hours.  Imaging: Dg Chest 2 View  Result Date: 11/22/2018 CLINICAL DATA:  History of lung tumor.  CVA. EXAM: CHEST - 2 VIEW COMPARISON:  August 16, 2017 FINDINGS: Tortuous thoracic aorta is stable. Stable cardiomegaly. No pneumothorax. The lungs are clear. IMPRESSION: No acute abnormalities identified. Electronically Signed   By: Dorise Bullion III M.D   On: 11/22/2018 17:53   Mr Brain Wo Contrast  Result Date: 11/22/2018 CLINICAL DATA:   69 year old female status post fall yesterday. Weakness, altered mental status, slurred speech. EXAM: MRI HEAD WITHOUT CONTRAST MRA HEAD WITHOUT CONTRAST TECHNIQUE: Multiplanar, multiecho pulse sequences of the brain and surrounding structures were obtained without intravenous contrast. Angiographic images of the head were obtained using MRA technique without contrast. COMPARISON:  Head CT without contrast earlier today. Brain MRI 06/23/2018. FINDINGS: MRI HEAD FINDINGS Brain: No restricted diffusion to suggest acute infarction. No midline shift, ventriculomegaly, extra-axial fluid collection or acute intracranial hemorrhage. Cervicomedullary junction and pituitary are within normal limits. Vertex left parasagittal extra-axial meningioma again suspected and size estimated at 24 x 18 x 16 millimeters (AP by transverse by CC) in the absence of contrast. Mass effect on the left superior frontal gyrus appears stable, and there is continued regional vasogenic edema, no definite progression since October. Superimposed patchy and scattered bilateral cerebral white matter T2 and FLAIR hyperintensity in a nonspecific configuration is stable. No chronic cerebral blood products identified. Signal in the deep gray matter nuclei, brainstem, and cerebellum remains normal. Vascular: Major intracranial vascular flow voids are stable. Skull and upper cervical spine: Previous left superior craniotomy. Visualized bone marrow signal is within normal limits. Negative visible Cervical spine.  Sinuses/Orbits: Stable and negative. Other: Trace mastoid fluid is stable. Visible internal auditory structures appear normal. No acute scalp or face soft tissue finding. MRA HEAD FINDINGS Antegrade flow in the posterior circulation with dominant right vertebral artery. The non dominant left vertebral is diminutive and appears to functionally terminates in PICA. No distal vertebral or basilar artery stenosis despite irregularity. Patent AICA and SCA  origins. Fetal type bilateral PCA origins. Bilateral PCA branches are patent with mild irregularity. Antegrade flow in both ICA siphons. Mild to moderate cavernous segment stenosis on the left (series 1024, image 16). No stenosis on the right. Ophthalmic and posterior communicating artery origins are normal. Patent carotid termini. Normal MCA and ACA origins. Anterior communicating artery is normal. The left ACA A2 segment appears dominant. Left MCA M1 segment bifurcates early. Visible left MCA branches are within normal limits. Right MCA M1 segment,, right MCA bifurcation and visible right MCA branches are within normal limits. IMPRESSION: 1. Negative for acute infarct. 2. Intracranial MRA is remarkable for intracranial atherosclerosis with mild to moderate cavernous Left ICA stenosis. 3. Grossly stable since October left vertex parasagittal Meningioma with vasogenic edema and mild mass effect. Meningioma size estimated at 2.4 centimeters in the absence of contrast. 4. Moderate chronic cerebral white matter signal changes, most commonly due to small vessel disease. Electronically Signed   By: Genevie Ann M.D.   On: 11/22/2018 19:16   Mr Cervical Spine W Wo Contrast  Result Date: 11/23/2018 CLINICAL DATA:  Fall with right-sided paresthesia. EXAM: MRI CERVICAL SPINE WITHOUT AND WITH CONTRAST TECHNIQUE: Multiplanar and multiecho pulse sequences of the cervical spine, to include the craniocervical junction and cervicothoracic junction, were obtained without and with intravenous contrast. CONTRAST:  10 mL Gadavist COMPARISON:  None. FINDINGS: Alignment: Normal. Vertebrae: No focal marrow lesion. No compression fracture or evidence of discitis osteomyelitis. Cord: Normal caliber and signal. Posterior Fossa, vertebral arteries, paraspinal tissues: Visualized posterior fossa is normal. Vertebral artery flow voids are preserved. No prevertebral effusion. Disc levels: Sagittal imaging includes the atlantoaxial joint to the  level of the T3-4 disc space, with axial imaging of the disc spaces from C2-3 to T1-2. C1-2: Mild degenerative change without stenosis. C2-3: No disc herniation or stenosis. C3-4: Mild left facet hypertrophy.  No stenosis. C4-5: Mild bilateral facet hypertrophy.  No stenosis. C5-6: Intermediate disc osteophyte complex with moderate bilateral neural foraminal stenosis and mild narrowing of the ventral thecal sac without spinal canal stenosis. C6-7: Disc bulge with bilateral uncovertebral hypertrophy. Moderate bilateral neural foraminal stenosis. No spinal canal stenosis. C7-T1: No disc herniation or stenosis. T1-2: Normal. T2-3: Normal. T3-4: Normal. No abnormal contrast enhancement. IMPRESSION: 1. No acute abnormality or contrast enhancing lesion of the cervical spine. 2. No cervical spinal canal stenosis. 3. Multilevel uncovertebral and facet arthrosis with moderate bilateral neural foraminal stenosis at C5-6 and C6-7. Electronically Signed   By: Ulyses Jarred M.D.   On: 11/23/2018 22:10   US Carotid Bilateral (at [facility] Only)  Result Date: 11/22/2018 CLINICAL DATA:  Stroke. EXAM: BILATERAL CAROTID DUPLEX ULTRASOUND TECHNIQUE: Pearline Cables scale imaging, color Doppler and duplex ultrasound were performed of bilateral carotid and vertebral arteries in the neck. COMPARISON:  Head CT obtained earlier today. FINDINGS: Criteria: Quantification of carotid stenosis is based on velocity parameters that correlate the residual internal carotid diameter with NASCET-based stenosis levels, using the diameter of the distal internal carotid lumen as the denominator for stenosis measurement. The following velocity measurements were obtained: RIGHT ICA: 137 cm/sec CCA: 88  cm/sec SYSTOLIC ICA/CCA RATIO:  1.6 ECA: 119 cm/sec LEFT ICA: 128 cm/sec CCA: 97 cm/sec SYSTOLIC ICA/CCA RATIO:  1.3 ECA: 162 cm/sec RIGHT CAROTID ARTERY: Plaque formation in the carotid bulb, proximal internal carotid artery and proximal external carotid artery.  RIGHT VERTEBRAL ARTERY:  Normal antegrade flow. LEFT CAROTID ARTERY: Plaque formation in the carotid bulb and proximal internal carotid artery. LEFT VERTEBRAL ARTERY:  Normal antegrade flow. 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 Electronically Signed   By: Claudie Revering M.D.   On: 11/22/2018 18:34   Mr Jodene Nam Head/brain YY Cm  Result Date: 11/22/2018 CLINICAL DATA:  68 year old female status post fall yesterday. Weakness, altered mental status, slurred speech. EXAM: MRI HEAD WITHOUT CONTRAST MRA HEAD WITHOUT CONTRAST TECHNIQUE: Multiplanar, multiecho pulse sequences of the brain and surrounding structures were obtained without intravenous contrast. Angiographic images of the head were obtained using MRA technique without contrast. COMPARISON:  Head CT without contrast earlier today. Brain MRI 06/23/2018. FINDINGS: MRI HEAD FINDINGS Brain: No restricted diffusion to suggest acute infarction. No midline shift, ventriculomegaly, extra-axial fluid collection or acute intracranial hemorrhage. Cervicomedullary junction and pituitary are within normal limits. Vertex left parasagittal extra-axial meningioma again suspected and size estimated at 24 x 18 x 16 millimeters (AP by transverse by CC) in the absence of contrast. Mass effect on the left superior frontal gyrus appears stable, and there is continued regional vasogenic edema, no definite progression since October. Superimposed patchy and scattered bilateral cerebral white matter T2 and FLAIR hyperintensity in a nonspecific configuration is stable. No chronic cerebral blood products identified. Signal in the deep gray matter nuclei, brainstem, and cerebellum remains normal. Vascular: Major intracranial vascular flow voids are stable. Skull and upper cervical spine: Previous left superior craniotomy. Visualized bone marrow signal is within normal limits. Negative visible Cervical  spine. Sinuses/Orbits: Stable and negative. Other: Trace mastoid fluid is stable. Visible internal auditory structures appear normal. No acute scalp or face soft tissue finding. MRA HEAD FINDINGS Antegrade flow in the posterior circulation with dominant right vertebral artery. The non dominant left vertebral is diminutive and appears to functionally terminates in PICA. No distal vertebral or basilar artery stenosis despite irregularity. Patent AICA and SCA origins. Fetal type bilateral PCA origins. Bilateral PCA branches are patent with mild irregularity. Antegrade flow in both ICA siphons. Mild to moderate cavernous segment stenosis on the left (series 1024, image 16). No stenosis on the right. Ophthalmic and posterior communicating artery origins are normal. Patent carotid termini. Normal MCA and ACA origins. Anterior communicating artery is normal. The left ACA A2 segment appears dominant. Left MCA M1 segment bifurcates early. Visible left MCA branches are within normal limits. Right MCA M1 segment,, right MCA bifurcation and visible right MCA branches are within normal limits. IMPRESSION: 1. Negative for acute infarct. 2. Intracranial MRA is remarkable for intracranial atherosclerosis with mild to moderate cavernous Left ICA stenosis. 3. Grossly stable since October left vertex parasagittal Meningioma with vasogenic edema and mild mass effect. Meningioma size estimated at 2.4 centimeters in the absence of contrast. 4. Moderate chronic cerebral white matter signal changes, most commonly due to small vessel disease. Electronically Signed   By: Genevie Ann M.D.   On: 11/22/2018 19:16    Medications:  I have reviewed the patient's current medications. Prior to Admission:  Medications Prior to Admission  Medication Sig Dispense Refill Last Dose  . Artificial Saliva (BIOTENE DRY MOUTH MOISTURIZING) SOLN Use as directed 1  application in the mouth or throat 2 (two) times daily. 44.3 mL 5 Taking  . Ascorbic Acid  (VITAMIN C) 500 MG CHEW Chew 500 mg by mouth daily as needed (cold/flu symptoms).    Unknown at PRN  . aspirin EC 81 MG tablet Take 1 tablet (81 mg total) by mouth daily before breakfast. 30 tablet 5 11/22/2018 at 0900  . atorvastatin (LIPITOR) 40 MG tablet Take 1 tablet (40 mg total) by mouth daily at 6 PM. 30 tablet 0 11/21/2018 at 1900  . benztropine (COGENTIN) 1 MG tablet Take 1 mg by mouth 2 (two) times daily.    11/22/2018 at 0900  . Dentifrices (BIOTENE DRY MOUTH GENTLE) PSTE Place 1 application onto teeth 2 (two) times daily. 121.9 g 5 Taking  . Elastic Bandages & Supports (KNEE COMPRESSION SLEEVE/L/XL) MISC 2 each by Does not apply route as needed. Wear during day; take off at night 2 each 0 Taking  . escitalopram (LEXAPRO) 10 MG tablet Take 10 mg by mouth daily.    11/22/2018 at 0900  . gabapentin (NEURONTIN) 100 MG capsule Take 100 mg by mouth 2 (two) times daily.   11/22/2018 at 0900  . haloperidol decanoate (HALDOL DECANOATE) 100 MG/ML injection Inject 1 mL (100 mg total) into the muscle every 28 (twenty-eight) days. Next injection due on 01/27/18 1 mL 0 As directed at As directed  . hydrochlorothiazide (HYDRODIURIL) 25 MG tablet Take 1 tablet (25 mg total) by mouth daily. 30 tablet 0 11/22/2018 at 0900  . ibuprofen (ADVIL,MOTRIN) 200 MG tablet Take 600 mg by mouth every 8 (eight) hours as needed (for knee pain.).   Unknown at PRN  . lisinopril (PRINIVIL,ZESTRIL) 20 MG tablet Take 1 tablet (20 mg total) by mouth daily. 30 tablet 0 11/22/2018 at 0900  . metFORMIN (GLUCOPHAGE) 500 MG tablet Take 1 tablet (500 mg total) by mouth 2 (two) times daily with a meal. 30 tablet 0 11/22/2018 at 0900  . omeprazole (PRILOSEC) 20 MG capsule Take 1 capsule (20 mg total) by mouth 2 (two) times daily. 180 capsule 0 11/22/2018 at 0900  . traZODone (DESYREL) 50 MG tablet Take 1 tablet (50 mg total) by mouth at bedtime. 30 tablet 0 11/21/2018 at 2000   Scheduled: . antiseptic oral rinse  15 mL Mouth Rinse BID  .  aspirin  300 mg Rectal Daily   Or  . aspirin EC  325 mg Oral Daily  . atorvastatin  40 mg Oral q1800  . benztropine  1 mg Oral BID  . divalproex  500 mg Oral Q12H  . enoxaparin (LOVENOX) injection  40 mg Subcutaneous BID  . escitalopram  10 mg Oral Daily  . hydrochlorothiazide  25 mg Oral Daily  . insulin aspart  0-20 Units Subcutaneous TID WC  . insulin aspart  0-5 Units Subcutaneous QHS  . lisinopril  20 mg Oral Daily  . pantoprazole  40 mg Oral Daily  . 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 MRI of the brain shows  left vertex parasagittal meningioma with vasogenic edema and mild mass-effect likely the cause of presenting symptoms.  Patient's been complaining of intermittent symptoms of numbness in her right face, arm and  leg since 2019.  She was evaluated by neurosurgery on 04/21/2018 and a repeat CT head at that time was concerning for interval enlargement of the meningioma.  She was offered resection but patient declined at that time. Given that her meningioma had grown she was given the option to perform stereotactic radiation and was referred to radiation oncology at [facility] which she completed. EEG consistent with a focal cerebral disturbance in the left hemisphere without definite seizure predisposition. Given her high risk for seizures, Depakote was initiated. She was also evaluated by Neurosurgery who will see her in the clinic.  Recommendation: 1. Start Depakote  500mg  po BID. Resume original dose of Gabapentin.  Will remain on this at discharge for outpatient Neurology to make further decisions for length of  continuation of anticonvulsant therapy.   2. Patient to follow up with Neurosurgery as scheduled on outpatient basis  3. Patient unable to drive, operate heavy machinery, perform activities at heights and participate in water activities until release by outpatient physician. 4. Discussed conditions and medications that could lower seizure threshold such as Wellbutrin, Tramadol, lack of sleep, infection, stress and fatigue.  This patient was staffed with Dr. Irish Elders, Alease Frame who personally evaluated patient, reviewed documentation and agreed with assessment and plan of care as above.  Rufina Falco, DNP, FNP-BC Board certified Nurse Practitioner Neurology Department   LOS: 1 day   11/24/2018  12:57 PM

## 2018-11-24 NOTE — Consult Note (Signed)
Neurosurgery-New Consultation Evaluation 11/24/2018 Lauren [name] [id number]  Identifying Statement: Lauren [name] is a 68 y.o. female from [place] [address] with known meningioma and concern for seizure  Physician Requesting Consultation: Dr. Manuella Ghazi  History of Present Illness: Lauren [name] is admitted after a fall from seated position where she noticed shaking of her right hand and spasms. There was onset of numbness along the right side from her scalp to her toes. She did notice some weakness along with this. She has not had any repeat episodes here and did have EEG which showed some slowing. She appears close to her baseline now and has no numbness but some slight hand weakness. She does not recall any events prior to these symptoms at home. She did not lose consciousness  She has a history for resection of left frontal meningioma years ago and more recently radiation for some recurrence. MRI brain was obtained here and shows a stable lesion with stable edema.   Past Medical History:  Past Medical History:  Diagnosis Date  . Anxiety   . Apnea, sleep 02/02/2014  . Awareness of heartbeats 02/02/2014  . Breathlessness on exertion 02/02/2014  . Diabetes mellitus   . Encounter for pre-employment examination 06/29/2015  . Excessive sweating 07/05/2015  . Fibromyalgia   . GERD (gastroesophageal reflux disease)   . Gravida 1 10/26/2015   1.    . Heart murmur   . Herniated disc   . Hyperlipidemia   . Hypertension   . Itch of skin 10/26/2015  . Lack of bladder control   . Lung tumor   . Rheumatoid arthritis ([place])   . Schizoaffective disorder ([place])   . Screening for cervical cancer 07/29/2017  . Seizure [facility])    after brain surgery 2012. last seizure 2013!  [name] Sex counseling 10/26/2015  . Sleep apnea   . 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 Stiffness of both knees 06/22/2015  . Stroke [facility])     Social History: Social History    Socioeconomic History  . Marital status: Single    Spouse name: Not on file  . Number of children: 1  . Years of education: Not on file  . Highest education level: 12th grade  Occupational History  . Not on file  Social Needs  . Financial resource strain: Somewhat hard  . Food insecurity:    Worry: Sometimes true    Inability: Never true  . Transportation needs:    Medical: No    Non-medical: No  Tobacco Use  . Smoking status: Never Smoker  . Smokeless tobacco: Never Used  Substance and Sexual Activity  . Alcohol use: No    Alcohol/week: 0.0 standard drinks  . Drug use: No  . Sexual activity: Not Currently  Lifestyle  . Physical activity:    Days per week: 0 days    Minutes per session: 0 min  . Stress: To some extent  Relationships  . Social connections:    Talks on phone: More than three times a week    Gets together: Three times a week    Attends religious service: More than 4 times per year    Active member of club or organization: No    Attends meetings of clubs or organizations: Never    Relationship status: Married  . Intimate partner violence:    Fear of current or ex partner: No    Emotionally abused: No    Physically abused: No    Forced  sexual activity: No  Other Topics Concern  . Not on file  Social History Narrative  . Not on file    Family History: Family History  Problem Relation Age of Onset  . Heart disease Brother   . Depression Mother   . Heart attack Mother   . Stroke Mother   . Alcohol abuse Father   . Stroke Father   . Diabetes Sister   . Diabetes Sister   . Stomach cancer Sister   . Kidney disease Sister   . COPD Brother   . Lung cancer Brother   . Diabetes Brother     Review of Systems:  Review of Systems - General ROS: Negative Psychological ROS: Negative Ophthalmic ROS: Negative ENT ROS: Negative Hematological and Lymphatic ROS: Negative  Endocrine ROS: Negative Respiratory ROS: Negative Cardiovascular ROS:  Negative Gastrointestinal ROS: Negative Genito-Urinary ROS: Negative Musculoskeletal ROS: Negative Neurological ROS: Positive for weakness, seizure Dermatological ROS: Negative  Physical Exam: BP (!) 149/91 (BP Location: Left Arm)   Pulse 77   Temp 97.8 F (36.6 C) (Lauren)   Resp 18   Ht 5\' 5"  (1.651 m)   Wt 110.9 kg   SpO2 97%   BMI 40.67 kg/m  Body mass index is 40.67 kg/m. Body surface area is 2.26 meters squared. General appearance: Alert, cooperative, in no acute distress Head: Normocephalic, atraumatic Eyes: Normal, EOM intact Oropharynx: Moist without lesions Ext: Warm extremities  Neurologic exam:  Mental status: alertness: alert, orientation: person, place, time, affect: normal Speech: fluent and clear Cranial nerves:  III/IV/VI: extra-ocular motions intact bilaterally V/VII:no evidence of facial droop or weakness  VIII: hearing normal XII: tongue strength symmetric  Motor:strength symmetric 5/5 in all extremities with exception of 4/5 in right interossei but grip appears full.  Sensory: intact to light touch in all extremities Gait: not tested, laying supine   Laboratory: Results for orders placed or performed during the hospital encounter of 11/22/18  CBC with Differential  Result Value Ref Range   WBC 4.8 4.0 - 10.5 K/uL   RBC 3.99 3.87 - 5.11 MIL/uL   Hemoglobin 12.5 12.0 - 15.0 g/dL   HCT 37.3 36.0 - 46.0 %   MCV 93.5 80.0 - 100.0 fL   MCH 31.3 26.0 - 34.0 pg   MCHC 33.5 30.0 - 36.0 g/dL   RDW 13.4 11.5 - 15.5 %   Platelets 246 150 - 400 K/uL   nRBC 0.0 0.0 - 0.2 %   Neutrophils Relative % 61 %   Neutro Abs 3.0 1.7 - 7.7 K/uL   Lymphocytes Relative 29 %   Lymphs Abs 1.4 0.7 - 4.0 K/uL   Monocytes Relative 7 %   Monocytes Absolute 0.4 0.1 - 1.0 K/uL   Eosinophils Relative 2 %   Eosinophils Absolute 0.1 0.0 - 0.5 K/uL   Basophils Relative 1 %   Basophils Absolute 0.0 0.0 - 0.1 K/uL   Immature Granulocytes 0 %   Abs Immature Granulocytes 0.02  0.00 - 0.07 K/uL  Comprehensive metabolic panel  Result Value Ref Range   Sodium 139 135 - 145 mmol/L   Potassium 3.5 3.5 - 5.1 mmol/L   Chloride 105 98 - 111 mmol/L   CO2 24 22 - 32 mmol/L   Glucose, Bld 130 (H) 70 - 99 mg/dL   BUN 12 8 - 23 mg/dL   Creatinine, Ser 0.74 0.44 - 1.00 mg/dL   Calcium 9.2 8.9 - 10.3 mg/dL   Total Protein 7.1 6.5 - 8.1 g/dL  Albumin 3.9 3.5 - 5.0 g/dL   AST 26 15 - 41 U/L   ALT 17 0 - 44 U/L   Alkaline Phosphatase 65 38 - 126 U/L   Total Bilirubin 0.6 0.3 - 1.2 mg/dL   GFR calc non Af Amer >60 >60 mL/min   GFR calc Af Amer >60 >60 mL/min   Anion gap 10 5 - 15  Urinalysis, Complete w Microscopic  Result Value Ref Range   Color, Urine YELLOW (A) YELLOW   APPearance CLEAR (A) CLEAR   Specific Gravity, Urine 1.014 1.005 - 1.030   pH 7.0 5.0 - 8.0   Glucose, UA NEGATIVE NEGATIVE mg/dL   Hgb urine dipstick NEGATIVE NEGATIVE   Bilirubin Urine NEGATIVE NEGATIVE   Ketones, ur NEGATIVE NEGATIVE mg/dL   Protein, ur NEGATIVE NEGATIVE mg/dL   Nitrite NEGATIVE NEGATIVE   Leukocytes,Ua NEGATIVE NEGATIVE   RBC / HPF 0-5 0 - 5 RBC/hpf   WBC, UA 0-5 0 - 5 WBC/hpf   Bacteria, UA NONE SEEN NONE SEEN   Squamous Epithelial / LPF 0-5 0 - 5  Troponin I - ONCE - STAT  Result Value Ref Range   Troponin I 0.07 (HH) <0.03 ng/mL  Troponin I - ONCE - STAT  Result Value Ref Range   Troponin I 0.09 (HH) <0.03 ng/mL  Hemoglobin A1c  Result Value Ref Range   Hgb A1c MFr Bld 6.4 (H) 4.8 - 5.6 %   Mean Plasma Glucose 136.98 mg/dL  Lipid panel  Result Value Ref Range   Cholesterol 157 0 - 200 mg/dL   Triglycerides 108 <150 mg/dL   HDL 38 (L) >40 mg/dL   Total CHOL/HDL Ratio 4.1 RATIO   VLDL 22 0 - 40 mg/dL   LDL Cholesterol 97 0 - 99 mg/dL  Glucose, capillary  Result Value Ref Range   Glucose-Capillary 83 70 - 99 mg/dL  Glucose, capillary  Result Value Ref Range   Glucose-Capillary 138 (H) 70 - 99 mg/dL   Comment 1 Notify RN   Glucose, capillary  Result Value  Ref Range   Glucose-Capillary 103 (H) 70 - 99 mg/dL  Troponin I - Now Then Q6H  Result Value Ref Range   Troponin I <0.03 <0.03 ng/mL  Troponin I - Now Then Q6H  Result Value Ref Range   Troponin I <0.03 <0.03 ng/mL  Troponin I - Now Then Q6H  Result Value Ref Range   Troponin I <0.03 <0.03 ng/mL  Troponin I - ONCE - STAT  Result Value Ref Range   Troponin I <0.03 <0.03 ng/mL  Glucose, capillary  Result Value Ref Range   Glucose-Capillary 121 (H) 70 - 99 mg/dL  Glucose, capillary  Result Value Ref Range   Glucose-Capillary 104 (H) 70 - 99 mg/dL  CBC  Result Value Ref Range   WBC 6.1 4.0 - 10.5 K/uL   RBC 3.72 (L) 3.87 - 5.11 MIL/uL   Hemoglobin 11.7 (L) 12.0 - 15.0 g/dL   HCT 35.2 (L) 36.0 - 46.0 %   MCV 94.6 80.0 - 100.0 fL   MCH 31.5 26.0 - 34.0 pg   MCHC 33.2 30.0 - 36.0 g/dL   RDW 14.1 11.5 - 15.5 %   Platelets 230 150 - 400 K/uL   nRBC 0.0 0.0 - 0.2 %  Basic metabolic panel  Result Value Ref Range   Sodium 140 135 - 145 mmol/L   Potassium 3.5 3.5 - 5.1 mmol/L   Chloride 107 98 - 111 mmol/L  CO2 25 22 - 32 mmol/L   Glucose, Bld 111 (H) 70 - 99 mg/dL   BUN 13 8 - 23 mg/dL   Creatinine, Ser 0.68 0.44 - 1.00 mg/dL   Calcium 9.4 8.9 - 10.3 mg/dL   GFR calc non Af Amer >60 >60 mL/min   GFR calc Af Amer >60 >60 mL/min   Anion gap 8 5 - 15  Glucose, capillary  Result Value Ref Range   Glucose-Capillary 123 (H) 70 - 99 mg/dL  Glucose, capillary  Result Value Ref Range   Glucose-Capillary 110 (H) 70 - 99 mg/dL  ECHOCARDIOGRAM COMPLETE  Result Value Ref Range   Weight 3,910.4 oz   Height 65 in   BP 133/84 mmHg   I personally reviewed labs  Imaging: MRI Brain:1. Negative for acute infarct. 2. Intracranial MRA is remarkable for intracranial atherosclerosis with mild to moderate cavernous Left ICA stenosis. 3. Grossly stable since October left vertex parasagittal Meningioma with vasogenic edema and mild mass effect. Meningioma size estimated at 2.4 centimeters in  the absence of contrast. 4. Moderate chronic cerebral white matter signal changes, most commonly due to small vessel disease.  MRI Cervical Spine: 1. No acute abnormality or contrast enhancing lesion of the cervical Spine. 2. No cervical spinal canal stenosis. 3. Multilevel uncovertebral and facet arthrosis with moderate bilateral neural foraminal stenosis at C5-6 and C6-7.   Impression/Plan:  Ms. Esses is here with likely seizure and post ictal symptoms of numbness and weakness which have resolved almost back to baseline. We have discussed that the MRI brain is stable and what we would expect after radiation. There is no increased edema. I suspect she did have a seizure and she will need long term management by neurology for AEDs. She should watch other things that could lower her threshold such as fatigue, fluid intake, blood sugars, etc. I can see her in clinic to discuss possible surgery but this would not be to prevent any future seizures as this is rarely controlled with resection of meningioma after previous surgery and radiation. No further imaging needed at this time from my perspective.    1.  Diagnosis: Meningioma with likely seizure  2.  Plan - Recommend neurology evaluation for ongoing seizure medication management - Will see in clinic as outpatient to discuss any interventions going forward

## 2018-11-24 NOTE — Discharge Instructions (Signed)
To prevent seizures, monitor yourself for fatigue, regular fluid intake, and keeping up your blood sugars by eating regularly.  It is very important that you follow-up with Dr. Lacinda Axon in 2 weeks for hospital follow-up for your brain meningioma and seizures, as well is in 2 weeks with your neurologist Dr. Melrose Nakayama for the same.  Stop taking: Haldol injections until you follow-up outpatient with your doctors, for now. There are increased drug interactions when taking both of these medications at the same time, it can be done but requires very close monitoring.  Start taking (for seizure management and for mood stabilization): Depakote 500 mg every 12 hours.  More information is attached to your paperwork about seizures and about this medication.

## 2018-11-24 NOTE — TOC Transition Note (Signed)
Transition of Care [facility]) - CM/SW Discharge Note   Patient Details  Name: Lauren [name] MRN: [id number] Date of Birth: [birth year]  [facility]) CM/SW Contact:  Shelbie Ammons, RN Phone Number: 11/24/2018, 10:29 AM   Clinical Narrative:   Discharge to home today per Dr. Manuella Ghazi.  Home Health services per [facility]. Will update [place], [facility] representative.  Lives alone. Sees Dr. Ancil Boozer as primary care physician Takes care of all basic activities of daily living herself.    Final next level of care: Home w Home Health Services Barriers to Discharge: No Barriers Identified  Unable to get transportation home. Will issue taxi voucher   Patient Goals and CMS Choice Patient states their goals for this hospitalization and ongoing recovery are:: (Return home) CMS Medicare.gov Compare Post Acute Care list provided to:: Patient Choice offered to / list presented to : Patient  Discharge Placement Home                       Discharge Plan and Services Discharge Planning Services: CM Consult Post Acute Care Choice: Home Health          DME Arranged: (Insurance has already paid for standard walker 2018. Advised to go to [employer])   [place] Arranged: OT [place] Agency: [facility]   Social Determinants of Health (SDOH) Interventions None     Readmission Risk Interventions No flowsheet data found.

## 2018-11-24 NOTE — Discharge Summary (Signed)
[facility] NAME: Lauren [name]    MR#:  [id number]  DATE OF BIRTH:  09-03-1951  DATE OF ADMISSION:  11/22/2018   ADMITTING PHYSICIAN: Gorden Harms, MD  DATE OF DISCHARGE: 11/24/2018  3:44 PM  PRIMARY CARE PHYSICIAN: Steele Sizer, MD   ADMISSION DIAGNOSIS:  Weakness [R53.1] Elevated troponin I level [R79.89] Fall, initial encounter [W19.XXXA] DISCHARGE DIAGNOSIS:  Active Problems:   Meningioma ([place])   Paresthesias   Seizures ([place])  SECONDARY DIAGNOSIS:   Past Medical History:  Diagnosis Date  . Anxiety   . Apnea, sleep 02/02/2014  . Awareness of heartbeats 02/02/2014  . Breathlessness on exertion 02/02/2014  . Diabetes mellitus   . Encounter for pre-employment examination 06/29/2015  . Excessive sweating 07/05/2015  . Fibromyalgia   . GERD (gastroesophageal reflux disease)   . Gravida 1 10/26/2015   1.    . Heart murmur   . Herniated disc   . Hyperlipidemia   . Hypertension   . Itch of skin 10/26/2015  . Lack of bladder control   . Lung tumor   . Rheumatoid arthritis ([place])   . Schizoaffective disorder ([place])   . Screening for cervical cancer 07/29/2017  . Seizure [facility])    after brain surgery 2012. last seizure 2013!  [name] Sex counseling 10/26/2015  . Sleep apnea   . 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 Stiffness of both knees 06/22/2015  . 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 Patient did not lose consciousness, in the emergency room patient was found to have a troponin of 0.07  did increase to 0.09, CT of the head noted for residual meningioma with vasogenic edema, hospitalist asked to evaluate/treat, patient evaluated in the emergency room, only complaining of dry mouth, in no apparent distress, resting comfortably in bed, patient admitted for acute right-sided paresthesias resulting in fall, incidentally found elevated troponins without chest pain.  IMPRESSION AND PLAN:  *Acute right face/arm/leg paresthesias With history of chronic CVA with left leg weakness, dysarthria Referred to the observation unit on our CVA/TIA protocol, check MRI of the brain, echocardiogram, carotid Dopplers for further evaluation, neurology/Dr. Doy Mince consulted for expert opinion, PT/OT/speech therapy to evaluate/treat, aspiration/fall precautions, head of bed at 30 degrees, neurochecks per routine, and continue close medical monitoring Seen by Neuro NP Ouma, feels there is no evidence of CVA/TIA and her symptoms and mood disorder are resulting from her left meningioma. Recommends consulting neurosurgery for treatment plan: Dr. Lacinda Axon saw the patient, reviewed images including MRI cervical spine and saw no indication for intervention at this time.  Patient had an EEG - ThisEEG is consistent with a focal cerebral disturbance in the left hemisphere without definite seizure predisposition. Neurology provided recs for Depakote initiation. Stop Haldol for possible interactions for now. Defer to PCP and Neurology. Inpatient psychiatrist advised it would be possible for her to continue both in the future but would require close monitoring. Outpatient follow-up made closely with neurology, neurosurgery, PCP, psychiatry at [facility]  *Chronic residual meningioma *Seizures See above CT head noted for residual meningioma with vasogenic edema Seizure precautions, Ativan as needed seizures, neurology consulted as  above, Depakote for seizure prevention, checked EEG for further evaluation - followup  outpatient as above. Educated on conditions and medications that could lower the seizure threshold  *Acute incidentally found elevated troponins without chest pain Conservative medical management, could be due to muscle injury from her fall per Dr. Manuella Ghazi.  *Chronic obstructive sleep apnea CPAP at bedtime/as needed, respiratory therapy for settings  *Chronic diabetes mellitus type 2 Hold metformin while in house, sliding-scale insulin Accu-Cheks per routine Gabapentin discontinued for risk of contributing to paresthesias. Restarted at discharge at original dose. Follow up with PCP.   *Chronic hypertension Stable Continue home regimen  *Chronic schizophrenia Stable Discontinued Haldol injections in the future for now - could be causing her paresthesias, reported parkinsonian symptoms per patient. However suspect more contribution from her meningioma at this time. Per Neurology and Dr. Manuella Ghazi hold Haldol injections pending outpatient follow-up and trial of Depakote, concern for interactions.  Seen by SLP, OT, PT who recommend HH OT, [place] SLP, no PT follow-up needed Also set patient up with [facility] RN, Aid, SW   DISCHARGE CONDITIONS:  stable CONSULTS OBTAINED:  Treatment Team:  Catarina Hartshorn, MD Deetta Perla, MD DRUG ALLERGIES:   Allergies  Allergen Reactions  . Percocet [Oxycodone-Acetaminophen] Diarrhea, Nausea And Vomiting and Nausea Only  . Tramadol Hcl Diarrhea, Nausea And Vomiting and Nausea Only  . Vicodin [Hydrocodone-Acetaminophen] Diarrhea, Nausea And Vomiting and Nausea Only   DISCHARGE MEDICATIONS:   Allergies as of 11/24/2018      Reactions   Percocet [oxycodone-acetaminophen] Diarrhea, Nausea And Vomiting, Nausea Only   Tramadol Hcl Diarrhea, Nausea And Vomiting, Nausea Only   Vicodin [hydrocodone-acetaminophen] Diarrhea, Nausea And Vomiting, Nausea Only      Medication List    STOP taking these medications   haloperidol decanoate 100 MG/ML  injection Commonly known as:  HALDOL DECANOATE     TAKE these medications   aspirin EC 81 MG tablet Take 1 tablet (81 mg total) by mouth daily before breakfast.   atorvastatin 40 MG tablet Commonly known as:  LIPITOR Take 1 tablet (40 mg total) by mouth daily at 6 PM.   benztropine 1 MG tablet Commonly known as:  COGENTIN Take 1 mg by mouth 2 (two) times daily.   Biotene Dry Mouth Gentle Pste Place 1 application onto teeth 2 (two) times daily.   Biotene Dry Mouth Moisturizing Soln Use as directed 1 application in the mouth or throat 2 (two) times daily.   divalproex 500 MG DR tablet Commonly known as:  DEPAKOTE Take 1 tablet (500 mg total) by mouth every 12 (twelve) hours for 30 days.   escitalopram 10 MG tablet Commonly known as:  LEXAPRO Take 10 mg by mouth daily.   gabapentin 100 MG capsule Commonly known as:  NEURONTIN Take 100 mg by mouth 2 (two) times daily.   hydrochlorothiazide 25 MG tablet Commonly known as:  HYDRODIURIL Take 1 tablet (25 mg total) by mouth daily.   ibuprofen 200 MG tablet Commonly known as:  ADVIL,MOTRIN Take 600 mg by mouth every 8 (eight) hours as needed (for knee pain.).   Knee Compression Sleeve/L/XL Misc 2 each by Does not apply route as needed. Wear during day; take off at night   lisinopril 20 MG tablet Commonly known as:  PRINIVIL,ZESTRIL Take 1 tablet (20 mg total) by mouth daily.   metFORMIN 500 MG tablet Commonly known as:  GLUCOPHAGE Take 1 tablet (500 mg total) by mouth 2 (two) times daily with a meal.   omeprazole  20 MG capsule Commonly known as:  PRILOSEC Take 1 capsule (20 mg total) by mouth 2 (two) times daily.   traZODone 50 MG tablet Commonly known as:  DESYREL Take 1 tablet (50 mg total) by mouth at bedtime.   Vitamin C 500 MG Chew Chew 500 mg by mouth daily as needed (cold/flu symptoms).        DISCHARGE INSTRUCTIONS:   DIET:  Cardiac diet and Diabetic diet DISCHARGE CONDITION:  Stable ACTIVITY:   No driving or operating heavy machinery. No water activities or performing activities at heights until seen and released by outpatient physician  OXYGEN:  Home Oxygen: No.  Oxygen Delivery: room air DISCHARGE LOCATION:  home with [facility] services.   If you experience worsening of your admission symptoms, develop shortness of breath, life threatening emergency, suicidal or homicidal thoughts you must seek medical attention immediately by calling 911 or calling your MD immediately if your symptoms are severe.  You Must read complete instructions/literature along with all the possible adverse reactions/side effects for all the medicines you take and that have been prescribed to you. Take any new medicines only after you have completely understood and accept all the possible adverse reactions/side effects.   Please note  You were cared for by a hospitalist during your hospital stay. If you have any questions about your discharge medications or the care you received while you were in the hospital after you are discharged, you can call the unit and asked to speak with the hospitalist on call if the hospitalist that took care of you is not available. Once you are discharged, your primary care physician will handle any further medical issues. Please note that NO REFILLS for any discharge medications will be authorized once you are discharged, as it is imperative that you return to your primary care physician (or establish a relationship with a primary care physician if you do not have one) for your aftercare needs so that they can reassess your need for medications and monitor your lab values.    On the day of Discharge:  VITAL SIGNS:  Blood pressure 135/82, pulse 71, temperature 97.8 F (36.6 C), temperature source Oral, resp. rate 18, height 5\' 5"  (1.651 m), weight 110.9 kg, SpO2 97 %. PHYSICAL EXAMINATION:  GENERAL:68 y.o.-year-old patient sitting up in the bed with no acute distress. BMI  40.67 EYES: Pupils equal, round, reactive to light and accommodation. No scleral icterus. Extraocular muscles intact.  HEENT: Head atraumatic, normocephalic. Oropharynx and nasopharynx clear.  NECK: Supple, no jugular venous distention. No thyroid enlargement, no tenderness.  LUNGS: Normal breath sounds bilaterally, no wheezing, rales,rhonchi or crepitation. No use of accessory muscles of respiration.  CARDIOVASCULAR: S1, S2 normal. No murmurs, rubs, or gallops.  ABDOMEN: Soft, nontender, nondistended. Bowel sounds present. No organomegaly or mass.  EXTREMITIES: No pedal edema, cyanosis, or clubbing.  NEUROLOGIC: Cranial nerves II through XII are intact.Chronic left leg weakness 4/5. Gait not checked.Dysarthria noted. Improved from yesterday. PSYCHIATRIC: The patient is alert and oriented x 3.  SKIN: No obvious rash, lesion, or ulcer.  DATA REVIEW:   CBC Recent Labs  Lab 11/24/18 0314  WBC 6.1  HGB 11.7*  HCT 35.2*  PLT 230    Chemistries  Recent Labs  Lab 11/22/18 1059 11/24/18 0314  NA 139 140  K 3.5 3.5  CL 105 107  CO2 24 25  GLUCOSE 130* 111*  BUN 12 13  CREATININE 0.74 0.68  CALCIUM 9.2 9.4  AST 26  --  ALT 17  --   ALKPHOS 65  --   BILITOT 0.6  --     RADIOLOGY:  Mr Cervical Spine W Wo Contrast  Result Date: 11/23/2018 CLINICAL DATA:  Fall with right-sided paresthesia. EXAM: MRI CERVICAL SPINE WITHOUT AND WITH CONTRAST TECHNIQUE: Multiplanar and multiecho pulse sequences of the cervical spine, to include the craniocervical junction and cervicothoracic junction, were obtained without and with intravenous contrast. CONTRAST:  10 mL Gadavist COMPARISON:  None. FINDINGS: Alignment: Normal. Vertebrae: No focal marrow lesion. No compression fracture or evidence of discitis osteomyelitis. Cord: Normal caliber and signal. Posterior Fossa, vertebral arteries, paraspinal tissues: Visualized posterior fossa is normal. Vertebral artery flow voids are preserved. No  prevertebral effusion. Disc levels: Sagittal imaging includes the atlantoaxial joint to the level of the T3-4 disc space, with axial imaging of the disc spaces from C2-3 to T1-2. C1-2: Mild degenerative change without stenosis. C2-3: No disc herniation or stenosis. C3-4: Mild left facet hypertrophy.  No stenosis. C4-5: Mild bilateral facet hypertrophy.  No stenosis. C5-6: Intermediate disc osteophyte complex with moderate bilateral neural foraminal stenosis and mild narrowing of the ventral thecal sac without spinal canal stenosis. C6-7: Disc bulge with bilateral uncovertebral hypertrophy. Moderate bilateral neural foraminal stenosis. No spinal canal stenosis. C7-T1: No disc herniation or stenosis. T1-2: Normal. T2-3: Normal. T3-4: Normal. No abnormal contrast enhancement. IMPRESSION: 1. No acute abnormality or contrast enhancing lesion of the cervical spine. 2. No cervical spinal canal stenosis. 3. Multilevel uncovertebral and facet arthrosis with moderate bilateral neural foraminal stenosis at C5-6 and C6-7. Electronically Signed   By: Ulyses Jarred M.D.   On: 11/23/2018 22:10     Management plans discussed with the patient and/or family and they are in agreement.  CODE STATUS: Prior   TOTAL TIME TAKING CARE OF THIS PATIENT: 45 minutes.    Charley Lafrance PA-C on 11/24/2018 at 9:20 PM  Between 7am to 6pm - Pager - [phone]  After 6pm go to [email or web address] - Technical brewer [place] [facility]  Office  [phone]  CC: Primary care physician; Steele Sizer, MD

## 2018-11-24 NOTE — Plan of Care (Signed)
  Problem: Education: Goal: Knowledge of disease or condition will improve Outcome: Progressing Goal: Knowledge of secondary prevention will improve Outcome: Progressing   Problem: Self-Care: Goal: Ability to participate in self-care as condition permits will improve Outcome: Progressing   Problem: Nutrition: Goal: Risk of aspiration will decrease Outcome: Progressing   Problem: Ischemic Stroke/TIA Tissue Perfusion: Goal: Complications of ischemic stroke/TIA will be minimized Outcome: Progressing   Problem: Education: Goal: Knowledge of General Education information will improve Description Including pain rating scale, medication(s)/side effects and non-pharmacologic comfort measures Outcome: Progressing   Problem: Health Behavior/Discharge Planning: Goal: Ability to manage health-related needs will improve Outcome: Progressing   Problem: Clinical Measurements: Goal: Ability to maintain clinical measurements within normal limits will improve Outcome: Progressing Goal: Will remain free from infection Outcome: Progressing Goal: Diagnostic test results will improve Outcome: Progressing Goal: Respiratory complications will improve Outcome: Progressing Goal: Cardiovascular complication will be avoided Outcome: Progressing   Problem: Activity: Goal: Risk for activity intolerance will decrease Outcome: Progressing   Problem: Nutrition: Goal: Adequate nutrition will be maintained Outcome: Progressing   Problem: Coping: Goal: Level of anxiety will decrease Outcome: Progressing   Problem: Elimination: Goal: Will not experience complications related to bowel motility Outcome: Progressing Goal: Will not experience complications related to urinary retention Outcome: Progressing   Problem: Pain Managment: Goal: General experience of comfort will improve Outcome: Progressing   Problem: Safety: Goal: Ability to remain free from injury will improve Outcome:  Progressing   Problem: Skin Integrity: Goal: Risk for impaired skin integrity will decrease Outcome: Progressing

## 2018-11-25 ENCOUNTER — Encounter (HOSPITAL_COMMUNITY): Payer: Self-pay | Admitting: Psychiatry

## 2018-11-25 ENCOUNTER — Telehealth: Payer: Self-pay | Admitting: Family Medicine

## 2018-11-25 ENCOUNTER — Telehealth: Payer: Self-pay

## 2018-11-25 DIAGNOSIS — K219 Gastro-esophageal reflux disease without esophagitis: Secondary | ICD-10-CM | POA: Diagnosis not present

## 2018-11-25 DIAGNOSIS — Z748 Other problems related to care provider dependency: Secondary | ICD-10-CM

## 2018-11-25 DIAGNOSIS — E119 Type 2 diabetes mellitus without complications: Secondary | ICD-10-CM | POA: Diagnosis not present

## 2018-11-25 DIAGNOSIS — Z7189 Other specified counseling: Principal | ICD-10-CM

## 2018-11-25 DIAGNOSIS — M5106 Intervertebral disc disorders with myelopathy, lumbar region: Secondary | ICD-10-CM | POA: Diagnosis not present

## 2018-11-25 DIAGNOSIS — I10 Essential (primary) hypertension: Secondary | ICD-10-CM | POA: Diagnosis not present

## 2018-11-25 DIAGNOSIS — M069 Rheumatoid arthritis, unspecified: Secondary | ICD-10-CM | POA: Diagnosis not present

## 2018-11-25 DIAGNOSIS — M1711 Unilateral primary osteoarthritis, right knee: Secondary | ICD-10-CM | POA: Diagnosis not present

## 2018-11-25 DIAGNOSIS — E785 Hyperlipidemia, unspecified: Secondary | ICD-10-CM | POA: Diagnosis not present

## 2018-11-25 DIAGNOSIS — G4730 Sleep apnea, unspecified: Secondary | ICD-10-CM | POA: Diagnosis not present

## 2018-11-25 DIAGNOSIS — D329 Benign neoplasm of meninges, unspecified: Secondary | ICD-10-CM | POA: Diagnosis not present

## 2018-11-25 DIAGNOSIS — Z7689 Persons encountering health services in other specified circumstances: Secondary | ICD-10-CM

## 2018-11-25 DIAGNOSIS — I2510 Atherosclerotic heart disease of native coronary artery without angina pectoris: Secondary | ICD-10-CM | POA: Diagnosis not present

## 2018-11-25 DIAGNOSIS — M797 Fibromyalgia: Secondary | ICD-10-CM | POA: Diagnosis not present

## 2018-11-25 NOTE — Telephone Encounter (Signed)
Per Dr. Ancil Boozer ok to give verbal orders for patient skilled nursing orders to [facility], RN with [facility]

## 2018-11-25 NOTE — Telephone Encounter (Signed)
Copied from [place] [phone]. Topic: Quick Communication - Home Health Verbal Orders >> Nov 25, 2018  2:52 PM Margot Ables wrote: Caller/Agency: Sharman Crate RN with [place] Number: [phone], secure VM if not able to answer Requesting OT/PT/Skilled Nursing/Social Work/Speech Therapy: SN Frequency: requesting VO for SN 1 week 4

## 2018-11-25 NOTE — Telephone Encounter (Signed)
Transition Care Management Follow-up Telephone Call  Date of discharge and from where: 11/24/18 [facility]   How have you been since you were released from the hospital? Pt states she is doing okay. She had 2 seizures and fell on Sunday prior to going to the hospital. She is having [facility] come to her home today to assess for any needs.    Any questions or concerns? No   Items Reviewed:  Did the pt receive and understand the discharge instructions provided? Yes   Medications obtained and verified? Yes   Any new allergies since your discharge? No   Dietary orders reviewed? Yes  Do you have support at home? Yes   Functional Questionnaire: (I = Independent and D = Dependent) ADLs: I  Bathing/Dressing- Needs assistance for safety  Meal Prep- I  Eating- I  Maintaining continence- I  Transferring/Ambulation- I  Managing Meds- I  Follow up appointments reviewed:   PCP Hospital f/u appt confirmed? Yes  Scheduled to see Dr. Ancil Boozer on 12/02/18 @ 9:20.  [facility] f/u appt confirmed? Yes  Scheduled to see neurology and neurosurgery  Are transportation arrangements needed? Yes  - phone number provided for [facility] and [employer] coming to her home today. Referral sent to [facility] teams for community resources and follow up on transportation needs.   If their condition worsens, is the pt aware to call PCP or go to the Emergency Dept.? Yes  Was the patient provided with contact information for the PCP's office or ED? Yes  Was to pt encouraged to call back with questions or concerns? Yes

## 2018-11-26 ENCOUNTER — Other Ambulatory Visit: Payer: Self-pay

## 2018-11-26 NOTE — Patient Outreach (Signed)
[facility]) Care Management  11/26/2018  Lauren [name] [facility] 08/08/1951 [id number]   Medication Adherence call to Mrs. Lauren [name] patient did not answer and is due on Metformin 500 mg. [place] said patient has been in the hospital and  Will deliver and pill pack every seven days. Mrs. Lauren [name] is showing due under [place].   [place] [facility] Direct Dial [phone]  Fax [phone] [email or web address]

## 2018-11-27 DIAGNOSIS — I10 Essential (primary) hypertension: Secondary | ICD-10-CM | POA: Diagnosis not present

## 2018-11-27 DIAGNOSIS — E785 Hyperlipidemia, unspecified: Secondary | ICD-10-CM | POA: Diagnosis not present

## 2018-11-27 DIAGNOSIS — M069 Rheumatoid arthritis, unspecified: Secondary | ICD-10-CM | POA: Diagnosis not present

## 2018-11-27 DIAGNOSIS — I2510 Atherosclerotic heart disease of native coronary artery without angina pectoris: Secondary | ICD-10-CM | POA: Diagnosis not present

## 2018-11-27 DIAGNOSIS — K219 Gastro-esophageal reflux disease without esophagitis: Secondary | ICD-10-CM | POA: Diagnosis not present

## 2018-11-27 DIAGNOSIS — M797 Fibromyalgia: Secondary | ICD-10-CM | POA: Diagnosis not present

## 2018-11-27 DIAGNOSIS — G4730 Sleep apnea, unspecified: Secondary | ICD-10-CM | POA: Diagnosis not present

## 2018-11-27 DIAGNOSIS — M5106 Intervertebral disc disorders with myelopathy, lumbar region: Secondary | ICD-10-CM | POA: Diagnosis not present

## 2018-11-27 DIAGNOSIS — M1711 Unilateral primary osteoarthritis, right knee: Secondary | ICD-10-CM | POA: Diagnosis not present

## 2018-11-27 DIAGNOSIS — E119 Type 2 diabetes mellitus without complications: Secondary | ICD-10-CM | POA: Diagnosis not present

## 2018-11-27 DIAGNOSIS — D329 Benign neoplasm of meninges, unspecified: Secondary | ICD-10-CM | POA: Diagnosis not present

## 2018-11-30 DIAGNOSIS — G4730 Sleep apnea, unspecified: Secondary | ICD-10-CM | POA: Diagnosis not present

## 2018-11-30 DIAGNOSIS — E119 Type 2 diabetes mellitus without complications: Secondary | ICD-10-CM | POA: Diagnosis not present

## 2018-11-30 DIAGNOSIS — M797 Fibromyalgia: Secondary | ICD-10-CM | POA: Diagnosis not present

## 2018-11-30 DIAGNOSIS — K219 Gastro-esophageal reflux disease without esophagitis: Secondary | ICD-10-CM | POA: Diagnosis not present

## 2018-11-30 DIAGNOSIS — E785 Hyperlipidemia, unspecified: Secondary | ICD-10-CM | POA: Diagnosis not present

## 2018-11-30 DIAGNOSIS — I10 Essential (primary) hypertension: Secondary | ICD-10-CM | POA: Diagnosis not present

## 2018-11-30 DIAGNOSIS — D329 Benign neoplasm of meninges, unspecified: Secondary | ICD-10-CM | POA: Diagnosis not present

## 2018-11-30 DIAGNOSIS — M5106 Intervertebral disc disorders with myelopathy, lumbar region: Secondary | ICD-10-CM | POA: Diagnosis not present

## 2018-11-30 DIAGNOSIS — I2510 Atherosclerotic heart disease of native coronary artery without angina pectoris: Secondary | ICD-10-CM | POA: Diagnosis not present

## 2018-11-30 DIAGNOSIS — M1711 Unilateral primary osteoarthritis, right knee: Secondary | ICD-10-CM | POA: Diagnosis not present

## 2018-11-30 DIAGNOSIS — M069 Rheumatoid arthritis, unspecified: Secondary | ICD-10-CM | POA: Diagnosis not present

## 2018-12-01 ENCOUNTER — Telehealth: Payer: Self-pay

## 2018-12-01 DIAGNOSIS — M1711 Unilateral primary osteoarthritis, right knee: Secondary | ICD-10-CM | POA: Diagnosis not present

## 2018-12-01 DIAGNOSIS — M069 Rheumatoid arthritis, unspecified: Secondary | ICD-10-CM | POA: Diagnosis not present

## 2018-12-01 DIAGNOSIS — G4730 Sleep apnea, unspecified: Secondary | ICD-10-CM | POA: Diagnosis not present

## 2018-12-01 DIAGNOSIS — M797 Fibromyalgia: Secondary | ICD-10-CM | POA: Diagnosis not present

## 2018-12-01 DIAGNOSIS — I2510 Atherosclerotic heart disease of native coronary artery without angina pectoris: Secondary | ICD-10-CM | POA: Diagnosis not present

## 2018-12-01 DIAGNOSIS — E785 Hyperlipidemia, unspecified: Secondary | ICD-10-CM | POA: Diagnosis not present

## 2018-12-01 DIAGNOSIS — K219 Gastro-esophageal reflux disease without esophagitis: Secondary | ICD-10-CM | POA: Diagnosis not present

## 2018-12-01 DIAGNOSIS — D329 Benign neoplasm of meninges, unspecified: Secondary | ICD-10-CM | POA: Diagnosis not present

## 2018-12-01 DIAGNOSIS — E119 Type 2 diabetes mellitus without complications: Secondary | ICD-10-CM | POA: Diagnosis not present

## 2018-12-01 DIAGNOSIS — M5106 Intervertebral disc disorders with myelopathy, lumbar region: Secondary | ICD-10-CM | POA: Diagnosis not present

## 2018-12-01 DIAGNOSIS — I10 Essential (primary) hypertension: Secondary | ICD-10-CM | POA: Diagnosis not present

## 2018-12-01 NOTE — Telephone Encounter (Signed)
Copied from [place] [phone]. Topic: Referral - Status >> Dec 01, 2018 27:12 AM Simone Curia D wrote: 06/07/902 spoke with patient about Medicare transportation resource. Patient stated that she has no other needs right now.MA

## 2018-12-02 ENCOUNTER — Telehealth: Payer: Self-pay | Admitting: Family Medicine

## 2018-12-02 ENCOUNTER — Ambulatory Visit: Payer: Self-pay | Admitting: Family Medicine

## 2018-12-02 DIAGNOSIS — K219 Gastro-esophageal reflux disease without esophagitis: Secondary | ICD-10-CM | POA: Diagnosis not present

## 2018-12-02 DIAGNOSIS — E119 Type 2 diabetes mellitus without complications: Secondary | ICD-10-CM | POA: Diagnosis not present

## 2018-12-02 DIAGNOSIS — I2510 Atherosclerotic heart disease of native coronary artery without angina pectoris: Secondary | ICD-10-CM | POA: Diagnosis not present

## 2018-12-02 DIAGNOSIS — M797 Fibromyalgia: Secondary | ICD-10-CM | POA: Diagnosis not present

## 2018-12-02 DIAGNOSIS — M1711 Unilateral primary osteoarthritis, right knee: Secondary | ICD-10-CM | POA: Diagnosis not present

## 2018-12-02 DIAGNOSIS — E785 Hyperlipidemia, unspecified: Secondary | ICD-10-CM | POA: Diagnosis not present

## 2018-12-02 DIAGNOSIS — M5106 Intervertebral disc disorders with myelopathy, lumbar region: Secondary | ICD-10-CM | POA: Diagnosis not present

## 2018-12-02 DIAGNOSIS — I10 Essential (primary) hypertension: Secondary | ICD-10-CM | POA: Diagnosis not present

## 2018-12-02 DIAGNOSIS — D329 Benign neoplasm of meninges, unspecified: Secondary | ICD-10-CM | POA: Diagnosis not present

## 2018-12-02 DIAGNOSIS — G4730 Sleep apnea, unspecified: Secondary | ICD-10-CM | POA: Diagnosis not present

## 2018-12-02 DIAGNOSIS — M069 Rheumatoid arthritis, unspecified: Secondary | ICD-10-CM | POA: Diagnosis not present

## 2018-12-02 NOTE — Telephone Encounter (Signed)
Copied from [place] [phone]. Topic: Quick Communication - Home Health Verbal Orders >> Dec 02, 2018 12:28 PM Percell Belt A wrote: Caller/Agency: Melvyn Neth with [facility] home health - left voicemail  Callback Number: [phone] Requesting OT/PT/Skilled Nursing/Social Work/Speech Therapy:  Need verbals for OT  Frequency: 1 Week 3

## 2018-12-02 NOTE — Telephone Encounter (Signed)
Please call and schedule her a virtual visit.

## 2018-12-03 ENCOUNTER — Other Ambulatory Visit: Payer: Self-pay

## 2018-12-03 DIAGNOSIS — M5106 Intervertebral disc disorders with myelopathy, lumbar region: Secondary | ICD-10-CM | POA: Diagnosis not present

## 2018-12-03 DIAGNOSIS — K219 Gastro-esophageal reflux disease without esophagitis: Secondary | ICD-10-CM | POA: Diagnosis not present

## 2018-12-03 DIAGNOSIS — I10 Essential (primary) hypertension: Secondary | ICD-10-CM | POA: Diagnosis not present

## 2018-12-03 DIAGNOSIS — M069 Rheumatoid arthritis, unspecified: Secondary | ICD-10-CM | POA: Diagnosis not present

## 2018-12-03 DIAGNOSIS — D329 Benign neoplasm of meninges, unspecified: Secondary | ICD-10-CM | POA: Diagnosis not present

## 2018-12-03 DIAGNOSIS — M797 Fibromyalgia: Secondary | ICD-10-CM | POA: Diagnosis not present

## 2018-12-03 DIAGNOSIS — G4730 Sleep apnea, unspecified: Secondary | ICD-10-CM | POA: Diagnosis not present

## 2018-12-03 DIAGNOSIS — E785 Hyperlipidemia, unspecified: Secondary | ICD-10-CM | POA: Diagnosis not present

## 2018-12-03 DIAGNOSIS — E119 Type 2 diabetes mellitus without complications: Secondary | ICD-10-CM | POA: Diagnosis not present

## 2018-12-03 DIAGNOSIS — M1711 Unilateral primary osteoarthritis, right knee: Secondary | ICD-10-CM | POA: Diagnosis not present

## 2018-12-03 DIAGNOSIS — I2510 Atherosclerotic heart disease of native coronary artery without angina pectoris: Secondary | ICD-10-CM | POA: Diagnosis not present

## 2018-12-03 NOTE — Telephone Encounter (Signed)
Refill request for Hypertension medication:  Lisinopril 20 mg HCTZ 25 mg  Last office visit pertaining to hypertension: 09/28/2018  BP Readings from Last 3 Encounters:  11/24/18 135/82  10/22/18 122/78  09/28/18 134/82   Lab Results  Component Value Date   CREATININE 0.68 11/24/2018   BUN 13 11/24/2018   NA 140 11/24/2018   K 3.5 11/24/2018   CL 107 11/24/2018   CO2 25 11/24/2018    Refill request for diabetic medication:   Metformin 500 mg  Last office visit pertaining to diabetes: 08/03/2018  Lab Results  Component Value Date   HGBA1C 6.4 (H) 11/23/2018   Refill Request for Cholesterol medication. Atorvastatin 40 mg  Lab Results  Component Value Date   CHOL 157 11/23/2018   HDL 38 (L) 11/23/2018   LDLCALC 97 11/23/2018   TRIG 108 11/23/2018   CHOLHDL 4.1 11/23/2018   Follow-ups on file. Tomorrow

## 2018-12-03 NOTE — Telephone Encounter (Signed)
appt made for tomorrow

## 2018-12-04 ENCOUNTER — Ambulatory Visit (INDEPENDENT_AMBULATORY_CARE_PROVIDER_SITE_OTHER): Payer: Medicare Other | Admitting: Family Medicine

## 2018-12-04 ENCOUNTER — Telehealth: Payer: Self-pay | Admitting: Family Medicine

## 2018-12-04 ENCOUNTER — Encounter: Payer: Self-pay | Admitting: Family Medicine

## 2018-12-04 DIAGNOSIS — I10 Essential (primary) hypertension: Secondary | ICD-10-CM

## 2018-12-04 DIAGNOSIS — F200 Paranoid schizophrenia: Secondary | ICD-10-CM | POA: Diagnosis not present

## 2018-12-04 DIAGNOSIS — Z09 Encounter for follow-up examination after completed treatment for conditions other than malignant neoplasm: Secondary | ICD-10-CM

## 2018-12-04 DIAGNOSIS — M1711 Unilateral primary osteoarthritis, right knee: Secondary | ICD-10-CM | POA: Diagnosis not present

## 2018-12-04 DIAGNOSIS — M069 Rheumatoid arthritis, unspecified: Secondary | ICD-10-CM | POA: Diagnosis not present

## 2018-12-04 DIAGNOSIS — E119 Type 2 diabetes mellitus without complications: Secondary | ICD-10-CM | POA: Diagnosis not present

## 2018-12-04 DIAGNOSIS — M797 Fibromyalgia: Secondary | ICD-10-CM | POA: Diagnosis not present

## 2018-12-04 DIAGNOSIS — M5106 Intervertebral disc disorders with myelopathy, lumbar region: Secondary | ICD-10-CM | POA: Diagnosis not present

## 2018-12-04 DIAGNOSIS — E669 Obesity, unspecified: Secondary | ICD-10-CM

## 2018-12-04 DIAGNOSIS — G4730 Sleep apnea, unspecified: Secondary | ICD-10-CM | POA: Diagnosis not present

## 2018-12-04 DIAGNOSIS — E785 Hyperlipidemia, unspecified: Secondary | ICD-10-CM | POA: Diagnosis not present

## 2018-12-04 DIAGNOSIS — R569 Unspecified convulsions: Secondary | ICD-10-CM | POA: Diagnosis not present

## 2018-12-04 DIAGNOSIS — I2510 Atherosclerotic heart disease of native coronary artery without angina pectoris: Secondary | ICD-10-CM | POA: Diagnosis not present

## 2018-12-04 DIAGNOSIS — K219 Gastro-esophageal reflux disease without esophagitis: Secondary | ICD-10-CM | POA: Diagnosis not present

## 2018-12-04 DIAGNOSIS — E1169 Type 2 diabetes mellitus with other specified complication: Secondary | ICD-10-CM

## 2018-12-04 DIAGNOSIS — D329 Benign neoplasm of meninges, unspecified: Secondary | ICD-10-CM | POA: Diagnosis not present

## 2018-12-04 MED ORDER — HYDROCHLOROTHIAZIDE 25 MG PO TABS: 25.0000 mg | ORAL_TABLET | Freq: Every day | ORAL | 2 refills | Status: DC

## 2018-12-04 MED ORDER — METFORMIN HCL 500 MG PO TABS: 500.0000 mg | ORAL_TABLET | Freq: Two times a day (BID) | ORAL | 2 refills | Status: DC

## 2018-12-04 MED ORDER — OMEPRAZOLE 20 MG PO CPDR: 20.0000 mg | DELAYED_RELEASE_CAPSULE | Freq: Two times a day (BID) | ORAL | 0 refills | Status: DC

## 2018-12-04 MED ORDER — LISINOPRIL 20 MG PO TABS: 20.0000 mg | ORAL_TABLET | Freq: Every day | ORAL | 2 refills | Status: DC

## 2018-12-04 MED ORDER — ATORVASTATIN CALCIUM 40 MG PO TABS: 40.0000 mg | ORAL_TABLET | Freq: Every day | ORAL | 2 refills | Status: DC

## 2018-12-04 NOTE — Telephone Encounter (Unsigned)
Copied from [place] [phone]. Topic: Quick Communication - Home Health Verbal Orders >> Dec 04, 2018 10:11 AM Percell Belt A wrote: Caller/Agency: Di Kindle with [facility] home health  Callback Number: [phone] Requesting OT/PT/Skilled Nursing/Social Work/Speech Therapy: requesting verbal for Speech therapy to continue Speech therapy   Frequency: 1 week 4

## 2018-12-04 NOTE — Telephone Encounter (Signed)
Left message on Di Kindle with [facility] home health voicemail to notify her Dr. Ancil Boozer gives verbal approval for patient to continue Speech Therapy.

## 2018-12-04 NOTE — Progress Notes (Signed)
Name: Lauren [name]   MRN: [id number]    DOB: 24-Aug-1951   Date:12/04/2018       Progress Note  Subjective  Chief Complaint  Chief Complaint  Patient presents with  . Medication Refill  . Consult    I connected with@ on 12/04/18 at  9:20 AM EDT by telephone and verified that I am speaking with the correct person using two identifiers.  I discussed the limitations, risks, security and privacy concerns of performing an evaluation and management service by telephone and the availability of in person appointments. Staff also discussed with the patient that there may be a patient responsible charge related to this service. Patient Location: home  Provider Location: at [facility] Additional Individuals present: none   HPI  Hospital Discharge Follow up: she went to [facility] on 11/24/2018, she developed severe fatigue and generalized weakness , felt stiff on the right side. She was admitted to [facility] and she was evaluated by Dr. 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 She was advised to stop Haldol and start Depakote. Metformin was held while in the hospital but she is back on medication now.  Schizoaffective disorder: she has been doing well on Haldol, kept her from being hospitalized. Last dose was at home 2 weeks ago. She gets it monthly. Explained that she cannot stop medication until she discuss it with her psychiatrist, and gets a medication to replace it. She has difficulty taking medication by mouth daily and that is why haldol works so well for her.   DMII/HTN/hyperlipidemia: I am sending refills of medications, she had labs done recently and reviewed today, she does not want to come in for face to face because of COVID-19 . Explained that she must contact psychiatrist.   Patient Active Problem List   Diagnosis Date Noted  . Seizures ([place]) 11/24/2018  . Paresthesias  11/22/2018  . Dry mouth 10/12/2018  . Hx of resection of meningioma 07/21/2018  . Tremor 07/21/2018  . Schizoaffective disorder ([place]) 12/29/2017  . Morbid obesity ([place]) 12/09/2017  . Meningioma ([place]) 11/25/2017  . Atherosclerosis of aorta ([place]) 11/25/2017  . Calcification of coronary artery 11/25/2017  . Schizophrenia ([place]) 11/04/2017  . Acid reflux 06/29/2015  . Diabetes mellitus type 2, controlled, without complications ([place]) 78/46/9629  . Cardiac murmur 06/29/2015  . Arthritis of knee, degenerative 06/28/2015  . Insomnia w/ sleep apnea 03/21/2015  . Anxiety disorder 03/21/2015  . Hypertension 03/21/2015  . HLD (hyperlipidemia) 03/21/2015  . Urge incontinence 03/21/2015    Past Surgical History:  Procedure Laterality Date  . BRAIN TUMOR EXCISION  2012   benign  . CARDIAC CATHETERIZATION  2008  . CESAREAN SECTION    . CYST REMOVAL HAND    . LUNG LOBECTOMY  1977   benign tumor  . TOTAL KNEE ARTHROPLASTY Left 01/28/2017   Procedure: TOTAL KNEE ARTHROPLASTY;  Surgeon: Corky Mull, MD;  Location: [facility] ORS;  Service: Orthopedics;  Laterality: Left;    Family History  Problem Relation Age of Onset  . Heart disease Brother   . Depression Mother   . Heart attack Mother   . Stroke Mother   . Alcohol abuse Father   . Stroke Father   . Diabetes Sister   . Diabetes Sister   . Stomach cancer Sister   . Kidney disease Sister   . COPD Brother   . Lung cancer Brother   .  Diabetes Brother     Social History   Socioeconomic History  . Marital status: Single    Spouse name: Not on file  . Number of children: 1  . Years of education: Not on file  . Highest education level: 12th grade  Occupational History  . Not on file  Social Needs  . Financial resource strain: Somewhat hard  . Food insecurity:    Worry: Sometimes true    Inability: Never true  . Transportation needs:    Medical: No    Non-medical: No  Tobacco Use  . Smoking status: Never Smoker  . Smokeless  tobacco: Never Used  Substance and Sexual Activity  . Alcohol use: No    Alcohol/week: 0.0 standard drinks  . Drug use: No  . Sexual activity: Not Currently  Lifestyle  . Physical activity:    Days per week: 0 days    Minutes per session: 0 min  . Stress: To some extent  Relationships  . Social connections:    Talks on phone: More than three times a week    Gets together: Three times a week    Attends religious service: More than 4 times per year    Active member of club or organization: No    Attends meetings of clubs or organizations: Never    Relationship status: Married  . Intimate partner violence:    Fear of current or ex partner: No    Emotionally abused: No    Physically abused: No    Forced sexual activity: No  Other Topics Concern  . Not on file  Social History Narrative  . Not on file     Current Outpatient Medications:  .  Artificial Saliva (BIOTENE DRY MOUTH MOISTURIZING) SOLN, Use as directed 1 application in the mouth or throat 2 (two) times daily., Disp: 44.3 mL, Rfl: 5 .  Ascorbic Acid (VITAMIN C) 500 MG CHEW, Chew 500 mg by mouth daily as needed (cold/flu symptoms). , Disp: , Rfl:  .  aspirin EC 81 MG tablet, Take 1 tablet (81 mg total) by mouth daily before breakfast., Disp: 30 tablet, Rfl: 5 .  atorvastatin (LIPITOR) 40 MG tablet, Take 1 tablet (40 mg total) by mouth daily at 6 PM., Disp: 30 tablet, Rfl: 0 .  benztropine (COGENTIN) 1 MG tablet, Take 1 mg by mouth 2 (two) times daily. , Disp: , Rfl:  .  Dentifrices (BIOTENE DRY MOUTH GENTLE) PSTE, Place 1 application onto teeth 2 (two) times daily., Disp: 121.9 g, Rfl: 5 .  divalproex (DEPAKOTE) 500 MG DR tablet, Take 1 tablet (500 mg total) by mouth every 12 (twelve) hours for 30 days., Disp: 60 tablet, Rfl: 0 .  Elastic Bandages & Supports (KNEE COMPRESSION SLEEVE/L/XL) MISC, 2 each by Does not apply route as needed. Wear during day; take off at night, Disp: 2 each, Rfl: 0 .  escitalopram (LEXAPRO) 10 MG  tablet, Take 10 mg by mouth daily. , Disp: , Rfl:  .  gabapentin (NEURONTIN) 100 MG capsule, Take 100 mg by mouth 2 (two) times daily., Disp: , Rfl:  .  hydrochlorothiazide (HYDRODIURIL) 25 MG tablet, Take 1 tablet (25 mg total) by mouth daily., Disp: 30 tablet, Rfl: 0 .  ibuprofen (ADVIL,MOTRIN) 200 MG tablet, Take 600 mg by mouth every 8 (eight) hours as needed (for knee pain.)., Disp: , Rfl:  .  lisinopril (PRINIVIL,ZESTRIL) 20 MG tablet, Take 1 tablet (20 mg total) by mouth daily., Disp: 30 tablet, Rfl: 0 .  metFORMIN (GLUCOPHAGE) 500 MG tablet, Take 1 tablet (500 mg total) by mouth 2 (two) times daily with a meal., Disp: 30 tablet, Rfl: 0 .  omeprazole (PRILOSEC) 20 MG capsule, Take 1 capsule (20 mg total) by mouth 2 (two) times daily., Disp: 180 capsule, Rfl: 0 .  traZODone (DESYREL) 50 MG tablet, Take 1 tablet (50 mg total) by mouth at bedtime., Disp: 30 tablet, Rfl: 0  Allergies  Allergen Reactions  . Percocet [Oxycodone-Acetaminophen] Diarrhea, Nausea And Vomiting and Nausea Only  . Tramadol Hcl Diarrhea, Nausea And Vomiting and Nausea Only  . Vicodin [Hydrocodone-Acetaminophen] Diarrhea, Nausea And Vomiting and Nausea Only    I personally reviewed active problem list, medication list, allergies with the patient/caregiver today.   ROS  She is still noticing problems with her leg  Objective  Virtual encounter, vitals not obtained.  There is no height or weight on file to calculate BMI.  Physical Exam  She has dysarthria , but is her baseline.  She is alert but difficulty to get history    PHQ2/9: Depression screen [facility] 2/9 12/04/2018 10/22/2018 09/28/2018 08/03/2018 06/23/2018  Decreased Interest 0 0 0 2 0  Down, Depressed, Hopeless 0 0 0 0 0  PHQ - 2 Score 0 0 0 2 0  Altered sleeping 0 0 0 0 0  Tired, decreased energy 3 3 0 3 0  Change in appetite 1 1 0 3 0  Feeling bad or failure about yourself  0 0 0 0 0  Trouble concentrating 0 3 0 2 0  Moving slowly or  fidgety/restless 1 1 0 2 0  Suicidal thoughts 0 0 0 0 0  PHQ-9 Score 5 8 0 12 0  Difficult doing work/chores - Not difficult at all Not difficult at all Somewhat difficult Not difficult at all  Some recent data might be hidden   PHQ-2/9 Result is positive.    Fall Risk: Fall Risk  12/04/2018 10/22/2018 09/28/2018 09/10/2018 08/03/2018  Falls in the past year? 1 1 1 1 1   Number falls in past yr: 1 1 1 1  0  Injury with Fall? 0 0 0 0 0  Risk for fall due to : - History of fall(s) - - -  Follow up - Falls prevention discussed Falls evaluation completed - -     Assessment and plan :   1. Schizophrenia, paranoid ([place])  Needs to follow up with psychiatrist ASAP, she gets monthly injections of Haldol and needs to be replaced with something else injectable.   2. Seizure-like activity ([place])  Recent visit to [facility] and admission   3. Hospital discharge follow-up  Difficulty to a hospital follow up over the phone, I asked patient to come in next week for a face to face encounter if possible . Advised her to see if her son can come in with her. She states she does not want to come in, but will contact her psychiatrist   4. Essential hypertension  She does not want to change any more medications  - hydrochlorothiazide (HYDRODIURIL) 25 MG tablet; Take 1 tablet (25 mg total) by mouth daily.  Dispense: 30 tablet; Refill: 2 - lisinopril (PRINIVIL,ZESTRIL) 20 MG tablet; Take 1 tablet (20 mg total) by mouth daily.  Dispense: 30 tablet; Refill: 2  5. Diabetes in obese  ([place])  - metFORMIN (GLUCOPHAGE) 500 MG tablet; Take 1 tablet (500 mg total) by mouth 2 (two) times daily with a meal.  Dispense: 30 tablet; Refill: 2   I discussed  the assessment and treatment plan with the patient. The patient was provided an opportunity to ask questions and all were answered. The patient agreed with the plan and demonstrated an understanding of the instructions.   The patient was advised to call back or seek an  in-person evaluation if the symptoms worsen or if the condition fails to improve as anticipated.  I provided 23  minutes of non-face-to-face time during this encounter.  Loistine Chance, MD

## 2018-12-07 DIAGNOSIS — M797 Fibromyalgia: Secondary | ICD-10-CM | POA: Diagnosis not present

## 2018-12-07 DIAGNOSIS — M1711 Unilateral primary osteoarthritis, right knee: Secondary | ICD-10-CM | POA: Diagnosis not present

## 2018-12-07 DIAGNOSIS — G4730 Sleep apnea, unspecified: Secondary | ICD-10-CM | POA: Diagnosis not present

## 2018-12-07 DIAGNOSIS — I10 Essential (primary) hypertension: Secondary | ICD-10-CM | POA: Diagnosis not present

## 2018-12-07 DIAGNOSIS — E119 Type 2 diabetes mellitus without complications: Secondary | ICD-10-CM | POA: Diagnosis not present

## 2018-12-07 DIAGNOSIS — M069 Rheumatoid arthritis, unspecified: Secondary | ICD-10-CM | POA: Diagnosis not present

## 2018-12-07 DIAGNOSIS — M5106 Intervertebral disc disorders with myelopathy, lumbar region: Secondary | ICD-10-CM | POA: Diagnosis not present

## 2018-12-07 DIAGNOSIS — I2510 Atherosclerotic heart disease of native coronary artery without angina pectoris: Secondary | ICD-10-CM | POA: Diagnosis not present

## 2018-12-07 DIAGNOSIS — D329 Benign neoplasm of meninges, unspecified: Secondary | ICD-10-CM | POA: Diagnosis not present

## 2018-12-07 DIAGNOSIS — E785 Hyperlipidemia, unspecified: Secondary | ICD-10-CM | POA: Diagnosis not present

## 2018-12-07 DIAGNOSIS — K219 Gastro-esophageal reflux disease without esophagitis: Secondary | ICD-10-CM | POA: Diagnosis not present

## 2018-12-07 NOTE — Telephone Encounter (Signed)
Called and spoke with patient and advised her of her appt in July

## 2018-12-07 NOTE — Telephone Encounter (Signed)
Stefanini calling back to check status. Please advise

## 2018-12-09 DIAGNOSIS — M1711 Unilateral primary osteoarthritis, right knee: Secondary | ICD-10-CM | POA: Diagnosis not present

## 2018-12-09 DIAGNOSIS — E785 Hyperlipidemia, unspecified: Secondary | ICD-10-CM | POA: Diagnosis not present

## 2018-12-09 DIAGNOSIS — M5106 Intervertebral disc disorders with myelopathy, lumbar region: Secondary | ICD-10-CM | POA: Diagnosis not present

## 2018-12-09 DIAGNOSIS — I2510 Atherosclerotic heart disease of native coronary artery without angina pectoris: Secondary | ICD-10-CM | POA: Diagnosis not present

## 2018-12-09 DIAGNOSIS — D329 Benign neoplasm of meninges, unspecified: Secondary | ICD-10-CM | POA: Diagnosis not present

## 2018-12-09 DIAGNOSIS — E119 Type 2 diabetes mellitus without complications: Secondary | ICD-10-CM | POA: Diagnosis not present

## 2018-12-09 DIAGNOSIS — K219 Gastro-esophageal reflux disease without esophagitis: Secondary | ICD-10-CM | POA: Diagnosis not present

## 2018-12-09 DIAGNOSIS — G4730 Sleep apnea, unspecified: Secondary | ICD-10-CM | POA: Diagnosis not present

## 2018-12-09 DIAGNOSIS — M069 Rheumatoid arthritis, unspecified: Secondary | ICD-10-CM | POA: Diagnosis not present

## 2018-12-09 DIAGNOSIS — I10 Essential (primary) hypertension: Secondary | ICD-10-CM | POA: Diagnosis not present

## 2018-12-09 DIAGNOSIS — M797 Fibromyalgia: Secondary | ICD-10-CM | POA: Diagnosis not present

## 2018-12-28 ENCOUNTER — Other Ambulatory Visit: Payer: Self-pay

## 2018-12-28 NOTE — Patient Outreach (Signed)
[facility]) Care Management  12/28/2018  Lauren [name] [facility] 1951-02-17 [id number]   Medication Adherence call to Mrs. Lauren [name] patient is due on Metformin 500 mg patient explain she is receiving a pill pack every week from the pharmacy and receive it every Monday she is still taking 1 tablet 2 times a day. Lauren [name] is showing past due under [place].   [place] Management Direct Dial [phone]  Fax [phone] [email or web address]

## 2018-12-29 DIAGNOSIS — R569 Unspecified convulsions: Secondary | ICD-10-CM | POA: Diagnosis not present

## 2019-01-13 DIAGNOSIS — B370 Candidal stomatitis: Secondary | ICD-10-CM | POA: Diagnosis not present

## 2019-01-13 DIAGNOSIS — R682 Dry mouth, unspecified: Secondary | ICD-10-CM | POA: Diagnosis not present

## 2019-02-04 ENCOUNTER — Ambulatory Visit: Payer: Self-pay

## 2019-02-04 NOTE — Telephone Encounter (Signed)
Attempted to call pt. At home # [phone]; voice mailbox is full.  Per DPR, called pt's sister, [name] @ [phone].  [name] will attempt to call the pt. And advise her to call back to the office to speak to a Triage nurse; if she cannot reach pt. By phone, she will go to her house and check on her.     Message from [place], [place] sent at 02/04/2019 2:36 PM EDT   Summary: difficulty breathing according to family member    Patient's son (name unknown) called in requesting a call be made to his mother due to her having a difficulty breathing while doing simple things. Son asked that we call and then disconnected. Attempt was made to get mother on the line, however mailbox was full. Please advise.

## 2019-02-04 NOTE — Telephone Encounter (Addendum)
Pt returned call after multiple messages. States SOB x 1 week. Pt speaking in short phrases. States SOB has worsened past few days. Can not lie flat at hs.  Denies cough, no CP or tightness. States she is wheezing at times. Denies fever. Pt is poor historian.  States she is SOB because her mouth is so dry from meds. States she uses artifical salvia. Pt lives alone. Pt made aware [place] was calling her sister and to press her Life Alert if SOB worsened. Pt verbalized understanding. TN called sister [name], on [place], who had just checked on pt when another triage nurse reached out to her as we could not reach pt.  Pts sister states "Pt was just fine when I was there." She was not short of breath at all." States pt has CPAP at HS she probably doesn't wear and is not taking her meds correctly. She states pt needs to be in assisted living.Asked sister if she could check on pt she stated she is on HD and is not well either. Advised sister to call pt and if she remains with SOB, go to ED. Sister states she will do so. States she will also try to reach pts son who has not been answering phone. She will continue to try to reach him.  Reason for Disposition . [1] MODERATE difficulty breathing (e.g., speaks in phrases, SOB even at rest, pulse 100-120) AND [2] NEW-onset or WORSE than normal  Answer Assessment - Initial Assessment Questions 1. RESPIRATORY STATUS: "Describe your breathing?" (e.g., wheezing, shortness of breath, unable to speak, severe coughing)     SOB at rest and with exertion 2. ONSET: "When did this breathing problem begin?"      2 weeks ago 3. PATTERN "Does the difficult breathing come and go, or has it been constant since it started?"      No worse,  Comes and goes 4. SEVERITY: "How bad is your breathing?" (e.g., mild, moderate, severe)    - MILD: No SOB at rest, mild SOB with walking, speaks normally in sentences, can lay down, no retractions, pulse < 100.    - MODERATE: SOB at rest, SOB with minimal  exertion and prefers to sit, cannot lie down flat, speaks in phrases, mild retractions, audible wheezing, pulse 100-120.    - SEVERE: Very SOB at rest, speaks in single words, struggling to breathe, sitting hunched forward, retractions, pulse > 120      moderate 5. RECURRENT SYMPTOM: "Have you had difficulty breathing before?" If so, ask: "When was the last time?" and "What happened that time?"      no 6. CARDIAC HISTORY: "Do you have any history of heart disease?" (e.g., heart attack, angina, bypass surgery, angioplasty)       7. LUNG HISTORY: "Do you have any history of lung disease?"  (e.g., pulmonary embolus, asthma, emphysema)      8. CAUSE: "What do you think is causing the breathing problem?"      Dry mouth, has had for 3-4 months 9. OTHER SYMPTOMS: "Do you have any other symptoms? (e.g., dizziness, runny nose, cough, chest pain, fever)     No 11. TRAVEL: "Have you traveled out of the country in the last month?" (e.g., travel history, exposures)       no  Protocols used: BREATHING DIFFICULTY-A-AH

## 2019-02-10 DIAGNOSIS — R0602 Shortness of breath: Secondary | ICD-10-CM | POA: Diagnosis not present

## 2019-02-12 ENCOUNTER — Other Ambulatory Visit: Payer: Self-pay

## 2019-02-12 ENCOUNTER — Emergency Department
Admission: EM | Admit: 2019-02-12 | Discharge: 2019-02-12 | Disposition: A | Discharge status: Home / Self Care | Payer: Medicare Other | Attending: Emergency Medicine | Admitting: Emergency Medicine

## 2019-02-12 ENCOUNTER — Emergency Department: Payer: Medicare Other

## 2019-02-12 ENCOUNTER — Encounter: Payer: Self-pay | Admitting: Emergency Medicine

## 2019-02-12 DIAGNOSIS — S0990XA Unspecified injury of head, initial encounter: Secondary | ICD-10-CM | POA: Diagnosis present

## 2019-02-12 DIAGNOSIS — E119 Type 2 diabetes mellitus without complications: Secondary | ICD-10-CM | POA: Diagnosis not present

## 2019-02-12 DIAGNOSIS — Y999 Unspecified external cause status: Secondary | ICD-10-CM | POA: Insufficient documentation

## 2019-02-12 DIAGNOSIS — I959 Hypotension, unspecified: Secondary | ICD-10-CM | POA: Diagnosis not present

## 2019-02-12 DIAGNOSIS — I10 Essential (primary) hypertension: Secondary | ICD-10-CM | POA: Diagnosis not present

## 2019-02-12 DIAGNOSIS — Z8673 Personal history of transient ischemic attack (TIA), and cerebral infarction without residual deficits: Secondary | ICD-10-CM | POA: Diagnosis not present

## 2019-02-12 DIAGNOSIS — Y92512 Supermarket, store or market as the place of occurrence of the external cause: Secondary | ICD-10-CM | POA: Insufficient documentation

## 2019-02-12 DIAGNOSIS — M25561 Pain in right knee: Secondary | ICD-10-CM

## 2019-02-12 DIAGNOSIS — S0081XA Abrasion of other part of head, initial encounter: Secondary | ICD-10-CM | POA: Diagnosis not present

## 2019-02-12 DIAGNOSIS — Z79899 Other long term (current) drug therapy: Secondary | ICD-10-CM | POA: Diagnosis not present

## 2019-02-12 DIAGNOSIS — S0083XA Contusion of other part of head, initial encounter: Secondary | ICD-10-CM

## 2019-02-12 DIAGNOSIS — Z7982 Long term (current) use of aspirin: Secondary | ICD-10-CM | POA: Diagnosis not present

## 2019-02-12 DIAGNOSIS — R000 Tachycardia, unspecified: Secondary | ICD-10-CM | POA: Diagnosis not present

## 2019-02-12 DIAGNOSIS — R52 Pain, unspecified: Secondary | ICD-10-CM | POA: Diagnosis not present

## 2019-02-12 DIAGNOSIS — Y9301 Activity, walking, marching and hiking: Secondary | ICD-10-CM | POA: Diagnosis not present

## 2019-02-12 DIAGNOSIS — W0110XA Fall on same level from slipping, tripping and stumbling with subsequent striking against unspecified object, initial encounter: Secondary | ICD-10-CM | POA: Insufficient documentation

## 2019-02-12 DIAGNOSIS — W19XXXA Unspecified fall, initial encounter: Secondary | ICD-10-CM

## 2019-02-12 DIAGNOSIS — R0902 Hypoxemia: Secondary | ICD-10-CM | POA: Diagnosis not present

## 2019-02-12 NOTE — ED Triage Notes (Signed)
Presents vis EMS s/p fall  States she tripped and fell  Having pain to right knee  Also has hematoma to forehead

## 2019-02-12 NOTE — Discharge Instructions (Addendum)
You were seen today for right knee pain, hematoma of forehead secondary to a fall. The xray of your right knee does not show any acute findings. The CT of your head shows only localized swelling under the skin, no fracture. You can apply ice for 10 minutes 2 x day to right knee for pain and swelling. You can take Ibuprofen 600 mg OTC as needed for pain and inflammation. Follow up with your PCP if pain persist or worsens.

## 2019-02-12 NOTE — ED Provider Notes (Signed)
[facility] Emergency Department Provider Note ____________________________________________  Time seen: 1100  I have reviewed the triage vital signs and the nursing notes.  HISTORY  Chief Complaint  Fall and Knee Pain   HPI Lauren [name] is a 68 y.o. female presents to the ER today via EMS with c/o right knee pain, hematoma of forehead s/p fall that occurred at approximately 1 hour ago. She was walking througha store when she reports she tripped over a box, landed on her right knee and hit her head on the floor. She describes the knee pain as achy and store. She is having difficulty ambulating, normally walks with a cane. She has not noticed any swelling, redness or abrasion of the right knee. She denies headache, dizziness, visual changes, clear fluid leaking from the ears or nose. She is not on blood thinners.  Past Medical History:  Diagnosis Date  . Anxiety   . Apnea, sleep 02/02/2014  . Awareness of heartbeats 02/02/2014  . Breathlessness on exertion 02/02/2014  . Diabetes mellitus   . Encounter for pre-employment examination 06/29/2015  . Excessive sweating 07/05/2015  . Fibromyalgia   . GERD (gastroesophageal reflux disease)   . Gravida 1 10/26/2015   1.    . Heart murmur   . Herniated disc   . Hyperlipidemia   . Hypertension   . Itch of skin 10/26/2015  . Lack of bladder control   . Lung tumor   . Rheumatoid arthritis ([place])   . Schizoaffective disorder ([place])   . Screening for cervical cancer 07/29/2017  . Seizure [facility])    after brain surgery 2012. last seizure 2013!  [name] Sex counseling 10/26/2015  . Sleep apnea   . 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 Stiffness of both knees 06/22/2015  . Stroke [facility])     Patient Active Problem List   Diagnosis Date Noted  . Seizures ([place]) 11/24/2018  . Paresthesias 11/22/2018  . Dry mouth 10/12/2018  . Hx of resection of meningioma 07/21/2018  . Tremor 07/21/2018  .  Schizoaffective disorder ([place]) 12/29/2017  . Morbid obesity ([place]) 12/09/2017  . Meningioma ([place]) 11/25/2017  . Atherosclerosis of aorta ([place]) 11/25/2017  . Calcification of coronary artery 11/25/2017  . Schizophrenia ([place]) 11/04/2017  . Acid reflux 06/29/2015  . Diabetes mellitus type 2, controlled, without complications ([place]) 86/76/7209  . Cardiac murmur 06/29/2015  . Arthritis of knee, degenerative 06/28/2015  . Insomnia w/ sleep apnea 03/21/2015  . Anxiety disorder 03/21/2015  . Hypertension 03/21/2015  . HLD (hyperlipidemia) 03/21/2015  . Urge incontinence 03/21/2015    Past Surgical History:  Procedure Laterality Date  . BRAIN TUMOR EXCISION  2012   benign  . CARDIAC CATHETERIZATION  2008  . CESAREAN SECTION    . CYST REMOVAL HAND    . LUNG LOBECTOMY  1977   benign tumor  . TOTAL KNEE ARTHROPLASTY Left 01/28/2017   Procedure: TOTAL KNEE ARTHROPLASTY;  Surgeon: Corky Mull, MD;  Location: [facility] ORS;  Service: Orthopedics;  Laterality: Left;    Prior to Admission medications   Medication Sig Start Date End Date Taking? Authorizing Provider  Artificial Saliva (BIOTENE DRY MOUTH MOISTURIZING) SOLN Use as directed 1 application in the mouth or throat 2 (two) times daily. 09/28/18   Hubbard Hartshorn, FNP  Ascorbic Acid (VITAMIN C) 500 MG CHEW Chew 500 mg by mouth daily as needed (cold/flu symptoms).     [provider]  aspirin EC 81 MG tablet  Take 1 tablet (81 mg total) by mouth daily before breakfast. 04/01/18   Ancil Boozer, Drue Stager, MD  atorvastatin (LIPITOR) 40 MG tablet Take 1 tablet (40 mg total) by mouth daily at 6 PM. 12/04/18   Ancil Boozer, Drue Stager, MD  benztropine (COGENTIN) 1 MG tablet Take 1 mg by mouth 2 (two) times daily.     [provider]  Dentifrices (BIOTENE DRY MOUTH GENTLE) PSTE Place 1 application onto teeth 2 (two) times daily. 09/28/18   Hubbard Hartshorn, FNP  divalproex (DEPAKOTE) 500 MG DR tablet Take 1 tablet (500 mg total) by mouth every 12 (twelve)  hours for 30 days. 11/24/18 12/24/18  Ripley Fraise, PA  Elastic Bandages & Supports (KNEE COMPRESSION SLEEVE/L/XL) MISC 2 each by Does not apply route as needed. Wear during day; take off at night 05/12/18   Poulose, Bethel Born, NP  escitalopram (LEXAPRO) 10 MG tablet Take 10 mg by mouth daily.     [provider]  gabapentin (NEURONTIN) 100 MG capsule Take 100 mg by mouth 2 (two) times daily.    [provider]  hydrochlorothiazide (HYDRODIURIL) 25 MG tablet Take 1 tablet (25 mg total) by mouth daily. 12/04/18   Steele Sizer, MD  ibuprofen (ADVIL,MOTRIN) 200 MG tablet Take 600 mg by mouth every 8 (eight) hours as needed (for knee pain.).    [provider]  lisinopril (PRINIVIL,ZESTRIL) 20 MG tablet Take 1 tablet (20 mg total) by mouth daily. 12/04/18   Steele Sizer, MD  metFORMIN (GLUCOPHAGE) 500 MG tablet Take 1 tablet (500 mg total) by mouth 2 (two) times daily with a meal. 12/04/18   Ancil Boozer, Drue Stager, MD  omeprazole (PRILOSEC) 20 MG capsule Take 1 capsule (20 mg total) by mouth 2 (two) times daily. 12/04/18   Steele Sizer, MD  traZODone (DESYREL) 50 MG tablet Take 1 tablet (50 mg total) by mouth at bedtime. 01/12/18   McNew, Tyson Babinski, MD    Allergies Percocet [oxycodone-acetaminophen]; Tramadol hcl; and Vicodin [hydrocodone-acetaminophen]  Family History  Problem Relation Age of Onset  . Heart disease Brother   . Depression Mother   . Heart attack Mother   . Stroke Mother   . Alcohol abuse Father   . Stroke Father   . Diabetes Sister   . Diabetes Sister   . Stomach cancer Sister   . Kidney disease Sister   . COPD Brother   . Lung cancer Brother   . Diabetes Brother     Social History Social History   Tobacco Use  . Smoking status: Never Smoker  . Smokeless tobacco: Never Used  Substance Use Topics  . Alcohol use: No    Alcohol/week: 0.0 standard drinks  . Drug use: No    Review of Systems  Constitutional: Negative for fever, chills or body  aches. Eyes: Negative for visual changes. ENT: Negative for clear drainage from the ears or nose. Cardiovascular: Negative for chest pain or chest tightness. Respiratory: Negative for cough shortness of breath. Gastrointestinal: Negative for abdominal pain, vomiting Musculoskeletal: Positive for right knee pain. Negative for left knee pain. Skin: Positive for abrasion to forehead. Neurological: Negative for headaches, focal weakness or numbness. ____________________________________________  PHYSICAL EXAM:  VITAL SIGNS: ED Triage Vitals [02/12/19 1100]  Enc Vitals Group     BP 121/80     Pulse Rate 65     Resp (!) 26     Temp 97.6 F (36.4 C)     Temp Source Oral     SpO2 99 %  Weight 234 lb (106.1 kg)     Height 5\' 4"  (1.626 m)     Head Circumference      Peak Flow      Pain Score      Pain Loc      Pain Edu?      Excl. in [place]?     Constitutional: Alert and oriented. Obese, in no distress. Head: 2 cm hematoma noted over right forehead. No deformity noted with palpation of the scalp. Eyes: Conjunctivae are normal. PERRL. Normal extraocular movements Ears: Canals clear.  Nose: No congestion/rhinorrhea/epistaxis. Cardiovascular: Normal rate, regular rhythm. Pedal pulses 2+ bilaterally. Respiratory: Tachypneic. No wheezes/rales/rhonchi. Musculoskeletal: Normal flexion and extension of the right knee. Joint enlargement noted but no swelling. Strength 5/5 BLE. Gait not visualized. Neurologic:  Sensation intact to BLE. Skin:  2 cm heamtoma noted over right forehead. 1 cm horizontal abrasion noted of right forehead. _______________________________________________   RADIOLOGY  Imaging Orders     DG Knee Complete 4 Views Right     CT Maxillofacial Wo Contrast  ____________________________________________   INITIAL IMPRESSION / ASSESSMENT AND PLAN / ED COURSE  Right Knee Pain s/p Fall, Hematoma of Forehead, Abrasion of Forehead:  Xray of right knee shows: IMPRESSION:   1. No fracture.  2. Extensive tricompartmental degenerative changes with calcified  loose bodies.    CT Maxillofacial shows: IMPRESSION:  Soft tissue contusion on the inferior aspect of the forehead.  Negative for fracture. No other abnormality is identified.    She declines any pain medication in the ER Advised ice for 10 minutes to right knee 2 x day Can take Ibuprofen 600 mg every 8 hours as needed for pain Follow up with PCP if pain persists or worsens ____________________________________________  FINAL CLINICAL IMPRESSION(S) / ED DIAGNOSES  Final diagnoses:  Acute pain of right knee  Traumatic hematoma of forehead, initial encounter  Abrasion of forehead, initial encounter  Fall, initial encounter   Webb Silversmith, NP    Jearld Fenton, NP 02/12/19 [place]    Earleen Newport, MD 02/12/19 [phone]

## 2019-02-15 ENCOUNTER — Other Ambulatory Visit: Payer: Self-pay

## 2019-02-15 ENCOUNTER — Observation Stay
Admission: EM | Admit: 2019-02-15 | Discharge: 2019-02-16 | Disposition: A | Discharge status: Home / Self Care | Payer: Medicare Other | Attending: Internal Medicine | Admitting: Internal Medicine

## 2019-02-15 ENCOUNTER — Encounter: Payer: Self-pay | Admitting: Emergency Medicine

## 2019-02-15 ENCOUNTER — Emergency Department: Payer: Medicare Other

## 2019-02-15 DIAGNOSIS — F419 Anxiety disorder, unspecified: Secondary | ICD-10-CM | POA: Diagnosis not present

## 2019-02-15 DIAGNOSIS — R0789 Other chest pain: Secondary | ICD-10-CM | POA: Diagnosis not present

## 2019-02-15 DIAGNOSIS — N390 Urinary tract infection, site not specified: Secondary | ICD-10-CM | POA: Diagnosis not present

## 2019-02-15 DIAGNOSIS — I10 Essential (primary) hypertension: Secondary | ICD-10-CM | POA: Diagnosis not present

## 2019-02-15 DIAGNOSIS — G4730 Sleep apnea, unspecified: Secondary | ICD-10-CM | POA: Diagnosis not present

## 2019-02-15 DIAGNOSIS — E872 Acidosis, unspecified: Secondary | ICD-10-CM

## 2019-02-15 DIAGNOSIS — E785 Hyperlipidemia, unspecified: Secondary | ICD-10-CM | POA: Insufficient documentation

## 2019-02-15 DIAGNOSIS — R079 Chest pain, unspecified: Secondary | ICD-10-CM | POA: Diagnosis not present

## 2019-02-15 DIAGNOSIS — K219 Gastro-esophageal reflux disease without esophagitis: Secondary | ICD-10-CM | POA: Diagnosis not present

## 2019-02-15 DIAGNOSIS — M19012 Primary osteoarthritis, left shoulder: Secondary | ICD-10-CM | POA: Diagnosis not present

## 2019-02-15 DIAGNOSIS — Z789 Other specified health status: Secondary | ICD-10-CM | POA: Diagnosis not present

## 2019-02-15 DIAGNOSIS — Z79899 Other long term (current) drug therapy: Secondary | ICD-10-CM | POA: Insufficient documentation

## 2019-02-15 DIAGNOSIS — R0602 Shortness of breath: Secondary | ICD-10-CM | POA: Diagnosis not present

## 2019-02-15 DIAGNOSIS — E1169 Type 2 diabetes mellitus with other specified complication: Secondary | ICD-10-CM | POA: Diagnosis not present

## 2019-02-15 DIAGNOSIS — K429 Umbilical hernia without obstruction or gangrene: Secondary | ICD-10-CM | POA: Insufficient documentation

## 2019-02-15 DIAGNOSIS — D329 Benign neoplasm of meninges, unspecified: Secondary | ICD-10-CM | POA: Insufficient documentation

## 2019-02-15 DIAGNOSIS — E873 Alkalosis: Secondary | ICD-10-CM

## 2019-02-15 DIAGNOSIS — S199XXA Unspecified injury of neck, initial encounter: Secondary | ICD-10-CM | POA: Diagnosis not present

## 2019-02-15 DIAGNOSIS — M25562 Pain in left knee: Secondary | ICD-10-CM | POA: Diagnosis not present

## 2019-02-15 DIAGNOSIS — E874 Mixed disorder of acid-base balance: Principal | ICD-10-CM | POA: Insufficient documentation

## 2019-02-15 DIAGNOSIS — E876 Hypokalemia: Secondary | ICD-10-CM | POA: Diagnosis not present

## 2019-02-15 DIAGNOSIS — Z7982 Long term (current) use of aspirin: Secondary | ICD-10-CM | POA: Diagnosis not present

## 2019-02-15 DIAGNOSIS — M797 Fibromyalgia: Secondary | ICD-10-CM | POA: Insufficient documentation

## 2019-02-15 DIAGNOSIS — T148XXA Other injury of unspecified body region, initial encounter: Secondary | ICD-10-CM | POA: Diagnosis not present

## 2019-02-15 DIAGNOSIS — Z8673 Personal history of transient ischemic attack (TIA), and cerebral infarction without residual deficits: Secondary | ICD-10-CM | POA: Insufficient documentation

## 2019-02-15 DIAGNOSIS — M069 Rheumatoid arthritis, unspecified: Secondary | ICD-10-CM | POA: Diagnosis not present

## 2019-02-15 DIAGNOSIS — A419 Sepsis, unspecified organism: Secondary | ICD-10-CM | POA: Diagnosis present

## 2019-02-15 DIAGNOSIS — S0990XA Unspecified injury of head, initial encounter: Secondary | ICD-10-CM | POA: Diagnosis not present

## 2019-02-15 DIAGNOSIS — F259 Schizoaffective disorder, unspecified: Secondary | ICD-10-CM | POA: Diagnosis not present

## 2019-02-15 DIAGNOSIS — Z1159 Encounter for screening for other viral diseases: Secondary | ICD-10-CM | POA: Diagnosis not present

## 2019-02-15 DIAGNOSIS — Z7984 Long term (current) use of oral hypoglycemic drugs: Secondary | ICD-10-CM | POA: Diagnosis not present

## 2019-02-15 DIAGNOSIS — J9600 Acute respiratory failure, unspecified whether with hypoxia or hypercapnia: Secondary | ICD-10-CM | POA: Diagnosis present

## 2019-02-15 DIAGNOSIS — R0600 Dyspnea, unspecified: Secondary | ICD-10-CM

## 2019-02-15 LAB — CBC WITH DIFFERENTIAL/PLATELET
Abs Immature Granulocytes: 0.01 10*3/uL (ref 0.00–0.07)
Basophils Absolute: 0.1 10*3/uL (ref 0.0–0.1)
Basophils Relative: 1 %
Eosinophils Absolute: 0 10*3/uL (ref 0.0–0.5)
Eosinophils Relative: 0 %
HCT: 33.8 % — ABNORMAL LOW (ref 36.0–46.0)
Hemoglobin: 12 g/dL (ref 12.0–15.0)
Immature Granulocytes: 0 %
Lymphocytes Relative: 26 %
Lymphs Abs: 1.8 10*3/uL (ref 0.7–4.0)
MCH: 32 pg (ref 26.0–34.0)
MCHC: 35.5 g/dL (ref 30.0–36.0)
MCV: 90.1 fL (ref 80.0–100.0)
Monocytes Absolute: 0.8 10*3/uL (ref 0.1–1.0)
Monocytes Relative: 11 %
Neutro Abs: 4.3 10*3/uL (ref 1.7–7.7)
Neutrophils Relative %: 62 %
Platelets: 231 10*3/uL (ref 150–400)
RBC: 3.75 MIL/uL — ABNORMAL LOW (ref 3.87–5.11)
RDW: 14.1 % (ref 11.5–15.5)
WBC: 7 10*3/uL (ref 4.0–10.5)
nRBC: 0 % (ref 0.0–0.2)

## 2019-02-15 LAB — COMPREHENSIVE METABOLIC PANEL
ALT: 22 U/L (ref 0–44)
AST: 47 U/L — ABNORMAL HIGH (ref 15–41)
Albumin: 4.3 g/dL (ref 3.5–5.0)
Alkaline Phosphatase: 64 U/L (ref 38–126)
Anion gap: 15 (ref 5–15)
BUN: 11 mg/dL (ref 8–23)
CO2: 17 mmol/L — ABNORMAL LOW (ref 22–32)
Calcium: 8.9 mg/dL (ref 8.9–10.3)
Chloride: 107 mmol/L (ref 98–111)
Creatinine, Ser: 0.94 mg/dL (ref 0.44–1.00)
GFR calc Af Amer: >60 mL/min (ref >60)
GFR calc non Af Amer: >60 mL/min (ref >60)
Glucose, Bld: 104 mg/dL — ABNORMAL HIGH (ref 70–99)
Potassium: 2.5 mmol/L — CL (ref 3.5–5.1)
Sodium: 139 mmol/L (ref 135–145)
Total Bilirubin: 1.3 mg/dL — ABNORMAL HIGH (ref 0.3–1.2)
Total Protein: 7.2 g/dL (ref 6.5–8.1)

## 2019-02-15 LAB — TROPONIN I: Troponin I: <0.03 ng/mL (ref <0.03)

## 2019-02-15 LAB — BLOOD GAS, ARTERIAL
Acid-base deficit: 0.2 mmol/L (ref 0.0–2.0)
Bicarbonate: 19.7 mmol/L — ABNORMAL LOW (ref 20.0–28.0)
FIO2: 0.28
O2 Saturation: 99.8 %
Patient temperature: 37
pCO2 arterial: 21 mmHg — ABNORMAL LOW (ref 32.0–48.0)
pH, Arterial: 7.58 — ABNORMAL HIGH (ref 7.350–7.450)
pO2, Arterial: 189 mmHg — ABNORMAL HIGH (ref 83.0–108.0)

## 2019-02-15 LAB — URINALYSIS, COMPLETE (UACMP) WITH MICROSCOPIC
Bilirubin Urine: NEGATIVE
Glucose, UA: NEGATIVE mg/dL
Hgb urine dipstick: NEGATIVE
Ketones, ur: 20 mg/dL — AB
Nitrite: NEGATIVE
Protein, ur: NEGATIVE mg/dL
Specific Gravity, Urine: >1.046 — ABNORMAL HIGH (ref 1.005–1.030)
WBC, UA: NONE SEEN WBC/hpf (ref 0–5)
pH: 8 (ref 5.0–8.0)

## 2019-02-15 LAB — LACTIC ACID, PLASMA
Lactic Acid, Venous: 2.2 mmol/L (ref 0.5–1.9)
Lactic Acid, Venous: 2.1 mmol/L (ref 0.5–1.9)

## 2019-02-15 LAB — SALICYLATE LEVEL: Salicylate Lvl: <7 mg/dL (ref 2.8–30.0)

## 2019-02-15 LAB — MAGNESIUM: Magnesium: 1.4 mg/dL — ABNORMAL LOW (ref 1.7–2.4)

## 2019-02-15 LAB — SARS CORONAVIRUS 2 BY RT PCR (HOSPITAL ORDER, PERFORMED IN ~~LOC~~ HOSPITAL LAB): SARS Coronavirus 2: NEGATIVE

## 2019-02-15 MED ORDER — POTASSIUM CHLORIDE 10 MEQ/100ML IV SOLN
10.0000 meq | INTRAVENOUS | Status: AC
  Administered 2019-02-15: 10 meq via INTRAVENOUS
  Filled 2019-02-15 (×3): qty 100

## 2019-02-15 MED ORDER — SODIUM CHLORIDE 0.9 % IV BOLUS
1000.0000 mL | Freq: Once | INTRAVENOUS | Status: AC
  Administered 2019-02-15: 1000 mL via INTRAVENOUS

## 2019-02-15 MED ORDER — MAGNESIUM SULFATE 2 GM/50ML IV SOLN
2.0000 g | Freq: Once | INTRAVENOUS | Status: AC
  Administered 2019-02-15: 2 g via INTRAVENOUS
  Filled 2019-02-15: qty 50

## 2019-02-15 MED ORDER — IOPAMIDOL (ISOVUE-370) INJECTION 76%
100.0000 mL | Freq: Once | INTRAVENOUS | Status: AC | PRN
  Administered 2019-02-15: 18:00:00 100 mL via INTRAVENOUS
  Filled 2019-02-15: qty 100

## 2019-02-15 MED ORDER — POTASSIUM CHLORIDE CRYS ER 20 MEQ PO TBCR
40.0000 meq | EXTENDED_RELEASE_TABLET | Freq: Once | ORAL | Status: AC
  Administered 2019-02-15: 40 meq via ORAL
  Filled 2019-02-15: qty 2

## 2019-02-15 MED ORDER — SODIUM CHLORIDE 0.9 % IV BOLUS
1000.0000 mL | Freq: Once | INTRAVENOUS | Status: AC
  Administered 2019-02-15: 20:00:00 1000 mL via INTRAVENOUS

## 2019-02-15 NOTE — ED Notes (Signed)
Date and time results received: 02/15/19 1753 (use smartphrase ".now" to insert current time)  Test: Potassium Critical Value: 2.5  Name of Provider Notified: Dr. Bland Span  Orders Received? Or Actions Taken?: No new orders at this time.
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
## 2019-02-15 NOTE — ED Notes (Signed)
Patient transported to CT 
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
## 2019-02-15 NOTE — ED Notes (Signed)
Pt placed on 2L [place] for comfort, MD aware
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
## 2019-02-16 ENCOUNTER — Other Ambulatory Visit: Payer: Self-pay | Admitting: *Deleted

## 2019-02-16 ENCOUNTER — Inpatient Hospital Stay: Payer: Medicare Other

## 2019-02-16 ENCOUNTER — Other Ambulatory Visit: Payer: Self-pay

## 2019-02-16 DIAGNOSIS — R0600 Dyspnea, unspecified: Secondary | ICD-10-CM | POA: Diagnosis not present

## 2019-02-16 DIAGNOSIS — N390 Urinary tract infection, site not specified: Secondary | ICD-10-CM | POA: Diagnosis not present

## 2019-02-16 DIAGNOSIS — R079 Chest pain, unspecified: Secondary | ICD-10-CM | POA: Diagnosis not present

## 2019-02-16 DIAGNOSIS — A419 Sepsis, unspecified organism: Secondary | ICD-10-CM | POA: Diagnosis present

## 2019-02-16 LAB — CBC
HCT: 30.9 % — ABNORMAL LOW (ref 36.0–46.0)
Hemoglobin: 10.6 g/dL — ABNORMAL LOW (ref 12.0–15.0)
MCH: 31.4 pg (ref 26.0–34.0)
MCHC: 34.3 g/dL (ref 30.0–36.0)
MCV: 91.4 fL (ref 80.0–100.0)
Platelets: 217 10*3/uL (ref 150–400)
RBC: 3.38 MIL/uL — ABNORMAL LOW (ref 3.87–5.11)
RDW: 14.1 % (ref 11.5–15.5)
WBC: 7.2 10*3/uL (ref 4.0–10.5)
nRBC: 0 % (ref 0.0–0.2)

## 2019-02-16 LAB — BASIC METABOLIC PANEL
Anion gap: 10 (ref 5–15)
BUN: 9 mg/dL (ref 8–23)
CO2: 19 mmol/L — ABNORMAL LOW (ref 22–32)
Calcium: 8 mg/dL — ABNORMAL LOW (ref 8.9–10.3)
Chloride: 112 mmol/L — ABNORMAL HIGH (ref 98–111)
Creatinine, Ser: 0.87 mg/dL (ref 0.44–1.00)
GFR calc Af Amer: >60 mL/min (ref >60)
GFR calc non Af Amer: >60 mL/min (ref >60)
Glucose, Bld: 149 mg/dL — ABNORMAL HIGH (ref 70–99)
Potassium: 2.8 mmol/L — ABNORMAL LOW (ref 3.5–5.1)
Sodium: 141 mmol/L (ref 135–145)

## 2019-02-16 LAB — PROTIME-INR
INR: 1.1 (ref 0.8–1.2)
Prothrombin Time: 14.4 seconds (ref 11.4–15.2)

## 2019-02-16 LAB — TSH: TSH: 1.1 u[IU]/mL (ref 0.350–4.500)

## 2019-02-16 LAB — MAGNESIUM: Magnesium: 1.9 mg/dL (ref 1.7–2.4)

## 2019-02-16 LAB — PROCALCITONIN: Procalcitonin: <0.1 ng/mL

## 2019-02-16 LAB — CORTISOL-AM, BLOOD: Cortisol - AM: 9.4 ug/dL (ref 6.7–22.6)

## 2019-02-16 LAB — LACTIC ACID, PLASMA
Lactic Acid, Venous: 1.4 mmol/L (ref 0.5–1.9)
Lactic Acid, Venous: 1.5 mmol/L (ref 0.5–1.9)

## 2019-02-16 LAB — POTASSIUM: Potassium: 3.4 mmol/L — ABNORMAL LOW (ref 3.5–5.1)

## 2019-02-16 LAB — GLUCOSE, CAPILLARY: Glucose-Capillary: 132 mg/dL — ABNORMAL HIGH (ref 70–99)

## 2019-02-16 MED ORDER — ENOXAPARIN SODIUM 40 MG/0.4ML ~~LOC~~ SOLN
40.0000 mg | SUBCUTANEOUS | Status: DC
  Administered 2019-02-16: 40 mg via SUBCUTANEOUS
  Filled 2019-02-16: qty 0.4

## 2019-02-16 MED ORDER — PANTOPRAZOLE SODIUM 40 MG PO TBEC
40.0000 mg | DELAYED_RELEASE_TABLET | Freq: Every day | ORAL | Status: DC
  Administered 2019-02-16: 40 mg via ORAL
  Filled 2019-02-16: qty 1

## 2019-02-16 MED ORDER — HYDROCHLOROTHIAZIDE 25 MG PO TABS
25.0000 mg | ORAL_TABLET | Freq: Every day | ORAL | Status: DC
  Administered 2019-02-16: 25 mg via ORAL
  Filled 2019-02-16: qty 1

## 2019-02-16 MED ORDER — GABAPENTIN 100 MG PO CAPS
100.0000 mg | ORAL_CAPSULE | Freq: Two times a day (BID) | ORAL | Status: DC
  Administered 2019-02-16: 100 mg via ORAL
  Filled 2019-02-16: qty 1

## 2019-02-16 MED ORDER — SODIUM CHLORIDE 0.9 % IV SOLN
1.0000 g | INTRAVENOUS | Status: DC
  Administered 2019-02-16: 03:00:00 1 g via INTRAVENOUS
  Filled 2019-02-16 (×2): qty 10

## 2019-02-16 MED ORDER — ACETAMINOPHEN 650 MG RE SUPP: 650.0000 mg | Freq: Four times a day (QID) | RECTAL | Status: DC | PRN

## 2019-02-16 MED ORDER — LORAZEPAM 2 MG/ML IJ SOLN
1.0000 mg | Freq: Once | INTRAMUSCULAR | Status: AC
  Administered 2019-02-16: 03:00:00 1 mg via INTRAVENOUS
  Filled 2019-02-16: qty 1

## 2019-02-16 MED ORDER — ATORVASTATIN CALCIUM 20 MG PO TABS: 40.0000 mg | ORAL_TABLET | Freq: Every day | ORAL | Status: DC

## 2019-02-16 MED ORDER — BENZTROPINE MESYLATE 1 MG PO TABS
1.0000 mg | ORAL_TABLET | Freq: Three times a day (TID) | ORAL | Status: DC
  Administered 2019-02-16: 1 mg via ORAL
  Filled 2019-02-16 (×3): qty 1

## 2019-02-16 MED ORDER — VITAMIN C 500 MG PO TABS
500.0000 mg | ORAL_TABLET | Freq: Every day | ORAL | Status: DC
  Administered 2019-02-16: 500 mg via ORAL
  Filled 2019-02-16: qty 1

## 2019-02-16 MED ORDER — POTASSIUM CHLORIDE 10 MEQ/100ML IV SOLN
10.0000 meq | INTRAVENOUS | Status: AC
  Administered 2019-02-16 (×4): 10 meq via INTRAVENOUS
  Filled 2019-02-16 (×4): qty 100

## 2019-02-16 MED ORDER — POTASSIUM CHLORIDE CRYS ER 20 MEQ PO TBCR
40.0000 meq | EXTENDED_RELEASE_TABLET | Freq: Once | ORAL | Status: AC
  Administered 2019-02-16: 40 meq via ORAL
  Filled 2019-02-16: qty 2

## 2019-02-16 MED ORDER — POLYETHYLENE GLYCOL 3350 17 G PO PACK: 17.0000 g | PACK | Freq: Every day | ORAL | Status: DC | PRN

## 2019-02-16 MED ORDER — POTASSIUM CHLORIDE CRYS ER 20 MEQ PO TBCR: 60.0000 meq | EXTENDED_RELEASE_TABLET | Freq: Once | ORAL | Status: DC

## 2019-02-16 MED ORDER — ONDANSETRON HCL 4 MG PO TABS: 4.0000 mg | ORAL_TABLET | Freq: Four times a day (QID) | ORAL | Status: DC | PRN

## 2019-02-16 MED ORDER — SODIUM CHLORIDE 0.9 % IV SOLN
INTRAVENOUS | Status: DC
  Administered 2019-02-16: 05:00:00 via INTRAVENOUS

## 2019-02-16 MED ORDER — CEPHALEXIN 500 MG PO CAPS: 500.0000 mg | ORAL_CAPSULE | Freq: Three times a day (TID) | ORAL | 0 refills | Status: AC

## 2019-02-16 MED ORDER — LISINOPRIL 20 MG PO TABS
20.0000 mg | ORAL_TABLET | Freq: Every day | ORAL | Status: DC
  Administered 2019-02-16: 20 mg via ORAL
  Filled 2019-02-16: qty 1

## 2019-02-16 MED ORDER — ESCITALOPRAM OXALATE 10 MG PO TABS
10.0000 mg | ORAL_TABLET | Freq: Every day | ORAL | Status: DC
  Administered 2019-02-16: 10 mg via ORAL
  Filled 2019-02-16: qty 1

## 2019-02-16 MED ORDER — POTASSIUM CHLORIDE 20 MEQ PO PACK: 40.0000 meq | PACK | Freq: Once | ORAL | Status: DC

## 2019-02-16 MED ORDER — METFORMIN HCL 500 MG PO TABS
500.0000 mg | ORAL_TABLET | Freq: Two times a day (BID) | ORAL | Status: DC
  Administered 2019-02-16: 500 mg via ORAL
  Filled 2019-02-16: qty 1

## 2019-02-16 MED ORDER — ONDANSETRON HCL 4 MG/2ML IJ SOLN: 4.0000 mg | Freq: Four times a day (QID) | INTRAMUSCULAR | Status: DC | PRN

## 2019-02-16 MED ORDER — ACETAMINOPHEN 325 MG PO TABS
650.0000 mg | ORAL_TABLET | Freq: Four times a day (QID) | ORAL | Status: DC | PRN
  Administered 2019-02-16: 650 mg via ORAL
  Filled 2019-02-16: qty 2

## 2019-02-16 MED ORDER — CALCIUM CARBONATE 1250 (500 CA) MG PO TABS
1000.0000 mg | ORAL_TABLET | Freq: Once | ORAL | Status: AC
  Administered 2019-02-16: 1000 mg via ORAL
  Filled 2019-02-16: qty 2

## 2019-02-16 MED ORDER — TRAZODONE HCL 50 MG PO TABS: 50.0000 mg | ORAL_TABLET | Freq: Every day | ORAL | Status: DC

## 2019-02-16 MED ORDER — ASPIRIN EC 81 MG PO TBEC
81.0000 mg | DELAYED_RELEASE_TABLET | Freq: Every day | ORAL | Status: DC
  Administered 2019-02-16: 81 mg via ORAL
  Filled 2019-02-16: qty 1

## 2019-02-16 MED ORDER — LEVETIRACETAM 500 MG PO TABS
500.0000 mg | ORAL_TABLET | Freq: Two times a day (BID) | ORAL | Status: DC
  Administered 2019-02-16: 500 mg via ORAL
  Filled 2019-02-16: qty 1

## 2019-02-16 NOTE — Care Management Obs Status (Signed)
[place] NOTIFICATION   Patient Details  Name: Lauren [name] MRN: [id number] Date of Birth: 30-Oct-1950   Medicare Observation Status Notification Given:  Yes    Elza Rafter, RN 02/16/2019, 10:52 AM

## 2019-02-16 NOTE — Consult Note (Signed)
PHARMACY CONSULT NOTE - FOLLOW UP  Pharmacy Consult for Electrolyte Monitoring and Replacement   Recent Labs: Potassium (mmol/L)  Date Value  02/16/2019 2.8 (L)  01/24/2014 3.6   Magnesium (mg/dL)  Date Value  02/16/2019 1.9   Calcium (mg/dL)  Date Value  02/16/2019 8.0 (L)   Calcium, Total (mg/dL)  Date Value  01/24/2014 9.2   Albumin (g/dL)  Date Value  02/15/2019 4.3  06/27/2016 4.1  06/05/2012 3.2 (L)   Sodium (mmol/L)  Date Value  02/16/2019 141  06/27/2016 144  01/24/2014 137   Corrected Ca: 8.24   Assessment: Pharmacy was consulted for electrolyte monitoring. On admission patient had hypomagnesemia and electrolytes were replaced. Patient presents with hypocalcemia and hypokalemia. Previously received potassium replacement yesterday. Today, K-Dur PO 60 mEq was ordered. However, > 40 mEq may cause GI upset.   Renal function is not impaired.  Goal of Therapy:  Electrolytes WNL.   Plan:  Will order 1000 mg x1 of elemental calcium.   K-Dur 60 mEq changed to 53mEq x1 dose. Will order KCL IV 10 mEq x4 runs.  Potassium level @ 1400  Will recheck all electrolytes with AM labs.  Rowland Lathe ,PharmD Clinical Pharmacist 02/16/2019 8:47 AM

## 2019-02-16 NOTE — Progress Notes (Signed)
Pastoral Care Visit   02/16/19 0940  Clinical Encounter Type  Visited With Patient  Visit Type Initial  Referral From Nurse  Consult/Referral To Chaplain  Stress Factors  Patient Stress Factors Health changes   Pt was reclining in bed and moving around a lot in pain. When chap asked, "are you in pain? Would you like me to get your RN?" Pt said "yes I am in pain but is just from this tube. I don't need my nurse."  Pt also mentioned that she didn't know if she was going home today but that a RN indicated such but she was in a lot of pain.  Melven Sartorius indicated that visit was related to [facility] and asked if that was something she knew about and if she wanted to complete today. Pt indicated that she was aware and had a copy of the ppwk from previous visit.  Pt is not interested at this time to complete due to pain.  Pt stated that she does not have anyone to select as her POA as all family members are also sick and unable to attend to her healthcare decisions. Melven Sartorius shared that we could complete health care questionnaire in pt chart so that medical staff have record of her wishes.  Pt declined at this time.  This chap notified bedside RN of pt pain and desire to not complete HCPOA ppwk at this time due to pain.    Darcey Nora, Chaplain

## 2019-02-16 NOTE — Progress Notes (Signed)
Pt d/c to home with son DJ. Education completed. All questions answered. Son updated via phone. All belongings sent with pt. IV removed intact. VSS.

## 2019-02-16 NOTE — ED Notes (Signed)
Pt given meal tray and apple juice at this time. MD aware.

## 2019-02-16 NOTE — ED Notes (Signed)
Pt cleaned and bed sheets changed at this time.

## 2019-02-16 NOTE — Plan of Care (Signed)
  Problem: Education: Goal: Knowledge of General Education information will improve Description Including pain rating scale, medication(s)/side effects and non-pharmacologic comfort measures Outcome: Progressing Note:  Patient admitted at 89. Patient complained of a headache. Patient profile completed. Patient has been updated on care plan.

## 2019-02-16 NOTE — TOC Transition Note (Signed)
Transition of Care [facility]) - CM/SW Discharge Note   Patient Details  Name: Lauren [name] MRN: [id number] Date of Birth: 1951-01-01  [facility] CM/SW Contact:  Elza Rafter, RN Phone Number: 02/16/2019, 11:48 AM   Clinical Narrative:   Patient is discharging to home today with home health RN, PT, aide, OT and SW.  Referral to [place] and accepted by [facility]  Patient is current with Dr. Ancil Boozer and does not have difficulties obtaining medications.  No transportation or housing issues.  Patient has oxygen at home PRN. Her brother or a friend will transport home.      Final next level of care: [place] Barriers to Discharge: No Barriers Identified   Patient Goals and CMS Choice Patient states their goals for this hospitalization and ongoing recovery are:: wants to go home CMS Medicare.gov Compare Post Acute Care list provided to:: Patient Choice offered to / list presented to : Patient  Discharge Placement                       Discharge Plan and Services   Discharge Planning Services: CM Consult Post Acute Care Choice: Home Health                    HH Arranged: RN, PT, Nurse's Aide, OT, Social Work [employer] Agency: [place] ([place]) Date [place]: 02/16/19 Time [place]: 1147 Representative spoke with at [place]: Corene Cornea at [place] ([place]) Interventions     Readmission Risk Interventions Readmission Risk Prevention Plan 02/16/2019  Transportation Screening Complete  PCP or Specialist Appt within 5-7 Days Complete  Home Care Screening Complete  Medication Review (RN CM) Complete  Some recent data might be hidden

## 2019-02-16 NOTE — Patient Outreach (Signed)
[place] [facility] Care Management  02/16/2019  Lauren [name] April 02, 1951 [id number]   Care coordination    Received referral : 6/9 Referral source : [facility] referral  Noted patient with ED visit on 6/8 and observation admission on 6/9 patient currently inpatient at [facility].   Plan  Will plan follow up call after discharge and update Trish Fountain , Care Management coordinator at PCP practice.   [name], RN, [place] Management Coordinator  [phone]- Mobile [phone]- Toll Free Main Office
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
## 2019-02-16 NOTE — Progress Notes (Signed)
Per NP, hold on Bipap for now. Patient to be given Ativan to assist with hyperventilating and anxiety. Will continue to monitor.
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
## 2019-02-16 NOTE — Consult Note (Signed)
PHARMACY CONSULT NOTE - FOLLOW UP  Pharmacy Consult for Electrolyte Monitoring and Replacement   Recent Labs: Potassium (mmol/L)  Date Value  02/16/2019 3.4 (L)  01/24/2014 3.6   Magnesium (mg/dL)  Date Value  02/16/2019 1.9   Calcium (mg/dL)  Date Value  02/16/2019 8.0 (L)   Calcium, Total (mg/dL)  Date Value  01/24/2014 9.2   Albumin (g/dL)  Date Value  02/15/2019 4.3  06/27/2016 4.1  06/05/2012 3.2 (L)   Sodium (mmol/L)  Date Value  02/16/2019 141  06/27/2016 144  01/24/2014 137   Corrected Ca: 8.24   Assessment: Pharmacy was consulted for electrolyte monitoring. On admission patient had hypomagnesemia and electrolytes were replaced. Patient presents with hypocalcemia and hypokalemia. Previously received potassium replacement yesterday. Today, K-Dur PO 60 mEq was ordered. However, > 40 mEq may cause GI upset.   Renal function is not impaired.  Goal of Therapy:  Electrolytes WNL.   Plan:  K @ 1414 3.4  Will order PO KCL 40 mEq x 1 dose.   Will recheck all electrolytes with AM labs.  Rowland Lathe ,PharmD Clinical Pharmacist 02/16/2019 3:11 PM

## 2019-02-16 NOTE — Care Management CC44 (Signed)
Condition Code 44 Documentation Completed  Patient Details  Name: Lauren [name] MRN: [id number] Date of Birth: 10-17-1950   Condition Code 44 given:  Yes Patient signature on Condition Code 44 notice:  Yes Documentation of 2 MD's agreement:  Yes Code 44 added to claim:  Yes    Elza Rafter, RN 02/16/2019, 10:52 AM

## 2019-02-17 ENCOUNTER — Encounter: Payer: Self-pay | Admitting: Emergency Medicine

## 2019-02-17 ENCOUNTER — Emergency Department
Admission: EM | Admit: 2019-02-17 | Discharge: 2019-02-17 | Disposition: A | Discharge status: Home / Self Care | Payer: Medicare Other | Attending: Emergency Medicine | Admitting: Emergency Medicine

## 2019-02-17 ENCOUNTER — Other Ambulatory Visit: Payer: Self-pay | Admitting: *Deleted

## 2019-02-17 ENCOUNTER — Other Ambulatory Visit: Payer: Self-pay

## 2019-02-17 DIAGNOSIS — R0789 Other chest pain: Secondary | ICD-10-CM | POA: Diagnosis not present

## 2019-02-17 DIAGNOSIS — F419 Anxiety disorder, unspecified: Secondary | ICD-10-CM | POA: Insufficient documentation

## 2019-02-17 DIAGNOSIS — R064 Hyperventilation: Secondary | ICD-10-CM | POA: Insufficient documentation

## 2019-02-17 DIAGNOSIS — Z5321 Procedure and treatment not carried out due to patient leaving prior to being seen by health care provider: Secondary | ICD-10-CM | POA: Diagnosis not present

## 2019-02-17 LAB — TROPONIN I: Troponin I: <0.03 ng/mL (ref <0.03)

## 2019-02-17 LAB — COMPREHENSIVE METABOLIC PANEL
ALT: 26 U/L (ref 0–44)
AST: 43 U/L — ABNORMAL HIGH (ref 15–41)
Albumin: 4.2 g/dL (ref 3.5–5.0)
Alkaline Phosphatase: 73 U/L (ref 38–126)
Anion gap: 11 (ref 5–15)
BUN: 6 mg/dL — ABNORMAL LOW (ref 8–23)
CO2: 17 mmol/L — ABNORMAL LOW (ref 22–32)
Calcium: 9.2 mg/dL (ref 8.9–10.3)
Chloride: 111 mmol/L (ref 98–111)
Creatinine, Ser: 0.65 mg/dL (ref 0.44–1.00)
GFR calc Af Amer: >60 mL/min (ref >60)
GFR calc non Af Amer: >60 mL/min (ref >60)
Glucose, Bld: 111 mg/dL — ABNORMAL HIGH (ref 70–99)
Potassium: 3.2 mmol/L — ABNORMAL LOW (ref 3.5–5.1)
Sodium: 139 mmol/L (ref 135–145)
Total Bilirubin: 0.9 mg/dL (ref 0.3–1.2)
Total Protein: 7.2 g/dL (ref 6.5–8.1)

## 2019-02-17 LAB — URINE CULTURE: Special Requests: NORMAL

## 2019-02-17 LAB — CBC WITH DIFFERENTIAL/PLATELET
Abs Immature Granulocytes: 0.02 10*3/uL (ref 0.00–0.07)
Basophils Absolute: 0.1 10*3/uL (ref 0.0–0.1)
Basophils Relative: 1 %
Eosinophils Absolute: 0 10*3/uL (ref 0.0–0.5)
Eosinophils Relative: 1 %
HCT: 32.3 % — ABNORMAL LOW (ref 36.0–46.0)
Hemoglobin: 11.4 g/dL — ABNORMAL LOW (ref 12.0–15.0)
Immature Granulocytes: 0 %
Lymphocytes Relative: 36 %
Lymphs Abs: 2.1 10*3/uL (ref 0.7–4.0)
MCH: 32 pg (ref 26.0–34.0)
MCHC: 35.3 g/dL (ref 30.0–36.0)
MCV: 90.7 fL (ref 80.0–100.0)
Monocytes Absolute: 0.5 10*3/uL (ref 0.1–1.0)
Monocytes Relative: 8 %
Neutro Abs: 3.2 10*3/uL (ref 1.7–7.7)
Neutrophils Relative %: 54 %
Platelets: 243 10*3/uL (ref 150–400)
RBC: 3.56 MIL/uL — ABNORMAL LOW (ref 3.87–5.11)
RDW: 14.4 % (ref 11.5–15.5)
WBC: 5.9 10*3/uL (ref 4.0–10.5)
nRBC: 0 % (ref 0.0–0.2)
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
## 2019-02-18 ENCOUNTER — Ambulatory Visit: Payer: Self-pay
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
## 2019-02-19 DIAGNOSIS — Z9181 History of falling: Secondary | ICD-10-CM | POA: Diagnosis not present

## 2019-02-19 DIAGNOSIS — Z7984 Long term (current) use of oral hypoglycemic drugs: Secondary | ICD-10-CM | POA: Diagnosis not present

## 2019-02-19 DIAGNOSIS — E119 Type 2 diabetes mellitus without complications: Secondary | ICD-10-CM | POA: Diagnosis not present

## 2019-02-19 DIAGNOSIS — M069 Rheumatoid arthritis, unspecified: Secondary | ICD-10-CM | POA: Diagnosis not present

## 2019-02-19 DIAGNOSIS — E785 Hyperlipidemia, unspecified: Secondary | ICD-10-CM | POA: Diagnosis not present

## 2019-02-19 DIAGNOSIS — Z8673 Personal history of transient ischemic attack (TIA), and cerebral infarction without residual deficits: Secondary | ICD-10-CM | POA: Diagnosis not present

## 2019-02-19 DIAGNOSIS — N390 Urinary tract infection, site not specified: Secondary | ICD-10-CM | POA: Diagnosis not present

## 2019-02-19 DIAGNOSIS — R0789 Other chest pain: Secondary | ICD-10-CM | POA: Diagnosis not present

## 2019-02-19 DIAGNOSIS — K219 Gastro-esophageal reflux disease without esophagitis: Secondary | ICD-10-CM | POA: Diagnosis not present

## 2019-02-19 DIAGNOSIS — D320 Benign neoplasm of cerebral meninges: Secondary | ICD-10-CM | POA: Diagnosis not present

## 2019-02-19 DIAGNOSIS — M797 Fibromyalgia: Secondary | ICD-10-CM | POA: Diagnosis not present

## 2019-02-19 DIAGNOSIS — G4730 Sleep apnea, unspecified: Secondary | ICD-10-CM | POA: Diagnosis not present

## 2019-02-19 DIAGNOSIS — Z96652 Presence of left artificial knee joint: Secondary | ICD-10-CM | POA: Diagnosis not present

## 2019-02-19 DIAGNOSIS — R4701 Aphasia: Secondary | ICD-10-CM | POA: Diagnosis not present

## 2019-02-22 ENCOUNTER — Telehealth: Payer: Self-pay | Admitting: Family Medicine

## 2019-02-22 DIAGNOSIS — Z7984 Long term (current) use of oral hypoglycemic drugs: Secondary | ICD-10-CM | POA: Diagnosis not present

## 2019-02-22 DIAGNOSIS — G4730 Sleep apnea, unspecified: Secondary | ICD-10-CM | POA: Diagnosis not present

## 2019-02-22 DIAGNOSIS — Z8673 Personal history of transient ischemic attack (TIA), and cerebral infarction without residual deficits: Secondary | ICD-10-CM | POA: Diagnosis not present

## 2019-02-22 DIAGNOSIS — R0789 Other chest pain: Secondary | ICD-10-CM | POA: Diagnosis not present

## 2019-02-22 DIAGNOSIS — D320 Benign neoplasm of cerebral meninges: Secondary | ICD-10-CM | POA: Diagnosis not present

## 2019-02-22 DIAGNOSIS — M069 Rheumatoid arthritis, unspecified: Secondary | ICD-10-CM | POA: Diagnosis not present

## 2019-02-22 DIAGNOSIS — Z96652 Presence of left artificial knee joint: Secondary | ICD-10-CM | POA: Diagnosis not present

## 2019-02-22 DIAGNOSIS — N390 Urinary tract infection, site not specified: Secondary | ICD-10-CM | POA: Diagnosis not present

## 2019-02-22 DIAGNOSIS — R4701 Aphasia: Secondary | ICD-10-CM | POA: Diagnosis not present

## 2019-02-22 DIAGNOSIS — K219 Gastro-esophageal reflux disease without esophagitis: Secondary | ICD-10-CM | POA: Diagnosis not present

## 2019-02-22 DIAGNOSIS — E785 Hyperlipidemia, unspecified: Secondary | ICD-10-CM | POA: Diagnosis not present

## 2019-02-22 DIAGNOSIS — Z9181 History of falling: Secondary | ICD-10-CM | POA: Diagnosis not present

## 2019-02-22 DIAGNOSIS — E119 Type 2 diabetes mellitus without complications: Secondary | ICD-10-CM | POA: Diagnosis not present

## 2019-02-22 DIAGNOSIS — M797 Fibromyalgia: Secondary | ICD-10-CM | POA: Diagnosis not present

## 2019-02-22 NOTE — Telephone Encounter (Unsigned)
Copied from [place] [id number]. Topic: Quick Communication - Home Health Verbal Orders >> Feb 22, 2019  3:58 PM Celene Kras A wrote: Caller/Agency: [facility] Callback Number: [phone] Requesting OT/PT/Skilled Nursing/Social Work/Speech Therapy: PT and social work evaluation.  Frequency: 2x for 2wks, 1x for 2wks

## 2019-02-23 ENCOUNTER — Telehealth: Payer: Self-pay | Admitting: Family Medicine

## 2019-02-23 NOTE — Telephone Encounter (Signed)
Caller/Agency: Mendel Ryder with [facility]  Callback Number: [phone] Requesting OT/PT/Skilled Nursing/Social Work/Speech Therapy: skilled nursing and add evaluation for speech therapy and medical social worker  Frequency: 1 time a week for 1 week, 2 times a week for 1 week, and 1 time a week for 2 weeks

## 2019-02-23 NOTE — Telephone Encounter (Signed)
Gave a verbal order per Dr. Ancil Boozer for Ms. Churchwell verbal [facility] today.

## 2019-02-23 NOTE — Telephone Encounter (Signed)
Copied from [place] [id number]. Topic: Quick Communication - Home Health Verbal Orders >> Feb 22, 2019  3:58 PM Celene Kras A wrote: Caller/Agency: [facility]/ [facility] Callback Number: [phone] Requesting OT/PT/Skilled Nursing/Social Work/Speech Therapy: PT and social work evaluation.  Frequency: 2x for 2wks, 1x for 2wks

## 2019-02-24 ENCOUNTER — Other Ambulatory Visit: Payer: Self-pay

## 2019-02-24 DIAGNOSIS — E1169 Type 2 diabetes mellitus with other specified complication: Secondary | ICD-10-CM

## 2019-02-24 NOTE — Telephone Encounter (Signed)
Left message for Mendel Ryder from [place] about verbal order.

## 2019-02-24 NOTE — Telephone Encounter (Signed)
Request for diabetes medication. Metformin 500 mg to [facility]   Last office visit pertaining to diabetes: 12/04/2018   Lab Results  Component Value Date   HGBA1C 6.4 (H) 11/23/2018      Follow up on 03/15/2019

## 2019-02-25 DIAGNOSIS — Z7984 Long term (current) use of oral hypoglycemic drugs: Secondary | ICD-10-CM | POA: Diagnosis not present

## 2019-02-25 DIAGNOSIS — Z96652 Presence of left artificial knee joint: Secondary | ICD-10-CM | POA: Diagnosis not present

## 2019-02-25 DIAGNOSIS — R0789 Other chest pain: Secondary | ICD-10-CM | POA: Diagnosis not present

## 2019-02-25 DIAGNOSIS — M069 Rheumatoid arthritis, unspecified: Secondary | ICD-10-CM | POA: Diagnosis not present

## 2019-02-25 DIAGNOSIS — G4730 Sleep apnea, unspecified: Secondary | ICD-10-CM | POA: Diagnosis not present

## 2019-02-25 DIAGNOSIS — Z8673 Personal history of transient ischemic attack (TIA), and cerebral infarction without residual deficits: Secondary | ICD-10-CM | POA: Diagnosis not present

## 2019-02-25 DIAGNOSIS — E119 Type 2 diabetes mellitus without complications: Secondary | ICD-10-CM | POA: Diagnosis not present

## 2019-02-25 DIAGNOSIS — N390 Urinary tract infection, site not specified: Secondary | ICD-10-CM | POA: Diagnosis not present

## 2019-02-25 DIAGNOSIS — R4701 Aphasia: Secondary | ICD-10-CM | POA: Diagnosis not present

## 2019-02-25 DIAGNOSIS — M797 Fibromyalgia: Secondary | ICD-10-CM | POA: Diagnosis not present

## 2019-02-25 DIAGNOSIS — Z9181 History of falling: Secondary | ICD-10-CM | POA: Diagnosis not present

## 2019-02-25 DIAGNOSIS — D320 Benign neoplasm of cerebral meninges: Secondary | ICD-10-CM | POA: Diagnosis not present

## 2019-02-25 DIAGNOSIS — K219 Gastro-esophageal reflux disease without esophagitis: Secondary | ICD-10-CM | POA: Diagnosis not present

## 2019-02-25 DIAGNOSIS — E785 Hyperlipidemia, unspecified: Secondary | ICD-10-CM | POA: Diagnosis not present

## 2019-02-26 ENCOUNTER — Other Ambulatory Visit: Payer: Self-pay

## 2019-02-26 ENCOUNTER — Ambulatory Visit (INDEPENDENT_AMBULATORY_CARE_PROVIDER_SITE_OTHER): Payer: Medicare Other | Admitting: Family Medicine

## 2019-02-26 ENCOUNTER — Ambulatory Visit: Payer: Self-pay

## 2019-02-26 ENCOUNTER — Encounter: Payer: Self-pay | Admitting: Family Medicine

## 2019-02-26 VITALS — BP 136/84 | HR 106 | Temp 97.3°F | Resp 20 | Ht 65.0 in | Wt 223.3 lb

## 2019-02-26 DIAGNOSIS — D320 Benign neoplasm of cerebral meninges: Secondary | ICD-10-CM | POA: Diagnosis not present

## 2019-02-26 DIAGNOSIS — E785 Hyperlipidemia, unspecified: Secondary | ICD-10-CM | POA: Diagnosis not present

## 2019-02-26 DIAGNOSIS — E873 Alkalosis: Secondary | ICD-10-CM

## 2019-02-26 DIAGNOSIS — Z8673 Personal history of transient ischemic attack (TIA), and cerebral infarction without residual deficits: Secondary | ICD-10-CM | POA: Diagnosis not present

## 2019-02-26 DIAGNOSIS — Z9181 History of falling: Secondary | ICD-10-CM | POA: Diagnosis not present

## 2019-02-26 DIAGNOSIS — R4701 Aphasia: Secondary | ICD-10-CM | POA: Diagnosis not present

## 2019-02-26 DIAGNOSIS — M797 Fibromyalgia: Secondary | ICD-10-CM | POA: Diagnosis not present

## 2019-02-26 DIAGNOSIS — I700 Atherosclerosis of aorta: Secondary | ICD-10-CM | POA: Diagnosis not present

## 2019-02-26 DIAGNOSIS — R0789 Other chest pain: Secondary | ICD-10-CM | POA: Diagnosis not present

## 2019-02-26 DIAGNOSIS — M069 Rheumatoid arthritis, unspecified: Secondary | ICD-10-CM | POA: Diagnosis not present

## 2019-02-26 DIAGNOSIS — Z748 Other problems related to care provider dependency: Secondary | ICD-10-CM

## 2019-02-26 DIAGNOSIS — R202 Paresthesia of skin: Secondary | ICD-10-CM

## 2019-02-26 DIAGNOSIS — Z7984 Long term (current) use of oral hypoglycemic drugs: Secondary | ICD-10-CM | POA: Diagnosis not present

## 2019-02-26 DIAGNOSIS — E119 Type 2 diabetes mellitus without complications: Secondary | ICD-10-CM | POA: Diagnosis not present

## 2019-02-26 DIAGNOSIS — K219 Gastro-esophageal reflux disease without esophagitis: Secondary | ICD-10-CM | POA: Diagnosis not present

## 2019-02-26 DIAGNOSIS — N390 Urinary tract infection, site not specified: Secondary | ICD-10-CM | POA: Diagnosis not present

## 2019-02-26 DIAGNOSIS — G4730 Sleep apnea, unspecified: Secondary | ICD-10-CM | POA: Diagnosis not present

## 2019-02-26 DIAGNOSIS — E876 Hypokalemia: Secondary | ICD-10-CM

## 2019-02-26 DIAGNOSIS — Z96652 Presence of left artificial knee joint: Secondary | ICD-10-CM | POA: Diagnosis not present

## 2019-02-26 DIAGNOSIS — F200 Paranoid schizophrenia: Secondary | ICD-10-CM

## 2019-02-26 DIAGNOSIS — R251 Tremor, unspecified: Secondary | ICD-10-CM
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
## 2019-02-27 DIAGNOSIS — R4701 Aphasia: Secondary | ICD-10-CM | POA: Diagnosis not present

## 2019-02-27 DIAGNOSIS — R0789 Other chest pain: Secondary | ICD-10-CM | POA: Diagnosis not present

## 2019-02-27 DIAGNOSIS — Z8673 Personal history of transient ischemic attack (TIA), and cerebral infarction without residual deficits: Secondary | ICD-10-CM | POA: Diagnosis not present

## 2019-02-27 DIAGNOSIS — G4730 Sleep apnea, unspecified: Secondary | ICD-10-CM | POA: Diagnosis not present

## 2019-02-27 DIAGNOSIS — K219 Gastro-esophageal reflux disease without esophagitis: Secondary | ICD-10-CM | POA: Diagnosis not present

## 2019-02-27 DIAGNOSIS — Z7984 Long term (current) use of oral hypoglycemic drugs: Secondary | ICD-10-CM | POA: Diagnosis not present

## 2019-02-27 DIAGNOSIS — D320 Benign neoplasm of cerebral meninges: Secondary | ICD-10-CM | POA: Diagnosis not present

## 2019-02-27 DIAGNOSIS — Z9181 History of falling: Secondary | ICD-10-CM | POA: Diagnosis not present

## 2019-02-27 DIAGNOSIS — M797 Fibromyalgia: Secondary | ICD-10-CM | POA: Diagnosis not present

## 2019-02-27 DIAGNOSIS — M069 Rheumatoid arthritis, unspecified: Secondary | ICD-10-CM | POA: Diagnosis not present

## 2019-02-27 DIAGNOSIS — E785 Hyperlipidemia, unspecified: Secondary | ICD-10-CM | POA: Diagnosis not present

## 2019-02-27 DIAGNOSIS — N390 Urinary tract infection, site not specified: Secondary | ICD-10-CM | POA: Diagnosis not present

## 2019-02-27 DIAGNOSIS — E119 Type 2 diabetes mellitus without complications: Secondary | ICD-10-CM | POA: Diagnosis not present

## 2019-02-27 DIAGNOSIS — Z96652 Presence of left artificial knee joint: Secondary | ICD-10-CM | POA: Diagnosis not present

## 2019-02-27 LAB — BASIC METABOLIC PANEL WITH GFR
BUN/Creatinine Ratio: 12 (calc) (ref 6–22)
BUN: 14 mg/dL (ref 7–25)
CO2: 17 mmol/L — ABNORMAL LOW (ref 20–32)
Calcium: 9.7 mg/dL (ref 8.6–10.4)
Chloride: 107 mmol/L (ref 98–110)
Creat: 1.16 mg/dL — ABNORMAL HIGH (ref 0.50–0.99)
GFR, Est African American: 57 mL/min/{1.73_m2} — ABNORMAL LOW (ref >=60)
GFR, Est Non African American: 49 mL/min/{1.73_m2} — ABNORMAL LOW (ref >=60)
Glucose, Bld: 101 mg/dL — ABNORMAL HIGH (ref 65–99)
Potassium: 3.3 mmol/L — ABNORMAL LOW (ref 3.5–5.3)
Sodium: 142 mmol/L (ref 135–146)

## 2019-03-01 ENCOUNTER — Other Ambulatory Visit (INDEPENDENT_AMBULATORY_CARE_PROVIDER_SITE_OTHER): Payer: Medicare Other

## 2019-03-01 ENCOUNTER — Ambulatory Visit: Payer: Self-pay

## 2019-03-01 DIAGNOSIS — E119 Type 2 diabetes mellitus without complications: Secondary | ICD-10-CM | POA: Diagnosis not present

## 2019-03-01 DIAGNOSIS — E1169 Type 2 diabetes mellitus with other specified complication: Secondary | ICD-10-CM | POA: Diagnosis not present

## 2019-03-01 DIAGNOSIS — E669 Obesity, unspecified: Secondary | ICD-10-CM | POA: Diagnosis not present

## 2019-03-01 DIAGNOSIS — E785 Hyperlipidemia, unspecified: Secondary | ICD-10-CM | POA: Diagnosis not present

## 2019-03-01 DIAGNOSIS — Z96652 Presence of left artificial knee joint: Secondary | ICD-10-CM | POA: Diagnosis not present

## 2019-03-01 DIAGNOSIS — M797 Fibromyalgia: Secondary | ICD-10-CM | POA: Diagnosis not present

## 2019-03-01 DIAGNOSIS — M069 Rheumatoid arthritis, unspecified: Secondary | ICD-10-CM | POA: Diagnosis not present

## 2019-03-01 DIAGNOSIS — D320 Benign neoplasm of cerebral meninges: Secondary | ICD-10-CM | POA: Diagnosis not present

## 2019-03-01 DIAGNOSIS — I10 Essential (primary) hypertension: Secondary | ICD-10-CM

## 2019-03-01 DIAGNOSIS — R0789 Other chest pain: Secondary | ICD-10-CM | POA: Diagnosis not present

## 2019-03-01 DIAGNOSIS — R251 Tremor, unspecified: Secondary | ICD-10-CM

## 2019-03-01 DIAGNOSIS — R4701 Aphasia: Secondary | ICD-10-CM | POA: Diagnosis not present

## 2019-03-01 DIAGNOSIS — Z9181 History of falling: Secondary | ICD-10-CM | POA: Diagnosis not present

## 2019-03-01 DIAGNOSIS — F200 Paranoid schizophrenia: Secondary | ICD-10-CM

## 2019-03-01 DIAGNOSIS — G4730 Sleep apnea, unspecified: Secondary | ICD-10-CM | POA: Diagnosis not present

## 2019-03-01 DIAGNOSIS — Z7984 Long term (current) use of oral hypoglycemic drugs: Secondary | ICD-10-CM | POA: Diagnosis not present

## 2019-03-01 DIAGNOSIS — N390 Urinary tract infection, site not specified: Secondary | ICD-10-CM | POA: Diagnosis not present

## 2019-03-01 DIAGNOSIS — K219 Gastro-esophageal reflux disease without esophagitis: Secondary | ICD-10-CM | POA: Diagnosis not present

## 2019-03-01 DIAGNOSIS — Z8673 Personal history of transient ischemic attack (TIA), and cerebral infarction without residual deficits: Secondary | ICD-10-CM | POA: Diagnosis not present

## 2019-03-01 MED ORDER — LISINOPRIL 20 MG PO TABS: 20.0000 mg | ORAL_TABLET | Freq: Every day | ORAL | 2 refills | Status: DC

## 2019-03-01 MED ORDER — ATORVASTATIN CALCIUM 40 MG PO TABS: 40.0000 mg | ORAL_TABLET | Freq: Every day | ORAL | 2 refills | Status: DC

## 2019-03-01 MED ORDER — METFORMIN HCL 500 MG PO TABS: 500.0000 mg | ORAL_TABLET | Freq: Two times a day (BID) | ORAL | 2 refills | Status: DC

## 2019-03-01 MED ORDER — HYDROCHLOROTHIAZIDE 25 MG PO TABS: 25.0000 mg | ORAL_TABLET | Freq: Every day | ORAL | 2 refills | Status: DC

## 2019-03-01 NOTE — Chronic Care Management (AMB) (Signed)
  Chronic Care Management   Note  03/01/2019 Name: Marielys Trinidad MRN: [id number] DOB: 1951-07-16  Care Coordination: Incoming call from Ms. Billy Fischer, LCSW with patients [place] agency. Ms. Pamella Pert is calling to discuss patients current barriers to care and to collaborate with RN CM. Ms. Pamella Pert states she will work with patient on community resources, options for additional income and medication assistance including possibility for Medicare Extra Help. RN CM informed LCSW that patient/sister [name] has been provided with contact information for [facility] to offset any transportation barriers patient may have if son is not able to assist. Ms. Pamella Pert will contact RN CM tomorrow for ongoing collaboration after her assessment is completed.   Mckinlee Dunk E. Rollene Rotunda, RN, BSN Nurse Care Coordinator [facility] / [facility] Care Management  [phone]

## 2019-03-02 ENCOUNTER — Ambulatory Visit: Payer: Self-pay | Admitting: *Deleted

## 2019-03-02 NOTE — Telephone Encounter (Addendum)
Pershing Cox, Qualified Mental Health Porfessional with [name] called stating that she would like to follow up with practice on visit  02/26/2019; she would like to know what the MD was going to do about the pt's potassium and her low oxygen; Jamas Lav notified of recommendations per Dr Ancil Boozer result noted dated 03/01/2019 "Your kidney function dropped a little and your potassium level is still low. I will send a rx for potassium pills to your pharmacy .  Please try to breast slowly and through a paper bag since the CO2 level is still low.  Take care, Dr. Ancil Boozer"; she verbalized understanding.

## 2019-03-02 NOTE — Telephone Encounter (Signed)
  Reason for Disposition . General information question, no triage required and triager able to answer question  Answer Assessment - Initial Assessment Questions 1. REASON FOR CALL or QUESTION: "What is your reason for calling today?" or "How can I best help you?" or "What question do you have that I can help answer?"     Recommendations from MD visit 02/26/2019  Protocols used: INFORMATION ONLY CALL-A-AH

## 2019-03-03 ENCOUNTER — Ambulatory Visit: Payer: Self-pay

## 2019-03-03 ENCOUNTER — Telehealth: Payer: Self-pay

## 2019-03-03 ENCOUNTER — Telehealth: Payer: Self-pay | Admitting: Family Medicine

## 2019-03-03 DIAGNOSIS — F200 Paranoid schizophrenia: Secondary | ICD-10-CM

## 2019-03-03 DIAGNOSIS — E876 Hypokalemia: Secondary | ICD-10-CM

## 2019-03-03 DIAGNOSIS — E873 Alkalosis: Secondary | ICD-10-CM

## 2019-03-03 DIAGNOSIS — R202 Paresthesia of skin: Secondary | ICD-10-CM

## 2019-03-03 NOTE — Telephone Encounter (Signed)
Copied from [place] [id number]. Topic: General - Other >> Mar 03, 2019  9:22 AM Oneta Rack wrote: Caller name: Malinda  Relation to pt: RN patient care management [facility] Call back number: [phone] fax [phone]    Reason for call:  Has the patient had recent seizure activity, has the patient been dx aphasia, and if so please fax notes to [phone].  In addition speech therapy home visit may have to be d/c until further notice due to therapist being out of the office unexpectedly.

## 2019-03-03 NOTE — Telephone Encounter (Signed)
Santiago Glad- [employer] returning a call to Dr Ancil Boozer nurse.  Please call: [phone]

## 2019-03-03 NOTE — Telephone Encounter (Signed)
Just spoke w/pt concerning speech therapy orders and she agreed and after she is finish. [facility] will inform us when pt is ready for orders.

## 2019-03-03 NOTE — Chronic Care Management (AMB) (Signed)
  Chronic Care Management   Follow Up Note   03/03/2019 Name: Lauren [name] MRN: [id number] DOB: [birth year]  Referred by: Steele Sizer, MD Reason for referral : Chronic Care Management (care coordination)   Care Coordination: CCM RN CM received incoming voice mail from Ms. Humberto Leep, sister to Ms. [employer]. Ms. Bujak has given CCM RN CM verbal permission to speak with Ms. Carlis Abbott regarding patients health/healthcare needs. Ms. Carlis Abbott would like to inform CCM RN CM that patient has agreed to a higher level of care. RN CM spoke with patient to clarify sisters statement. Per Ms. Gannett, [place] Worker presented group home option to patient as patient needs assistance with IADLs and medication management. Ms. Ramdass states she is doing well. Shortness of breath during conversation noted, but this seems to be patients norm. She will follow up with neurology 6/30 and neurosurgeon 10/6. She remains active with [place] team including OT, PT, RN, and SW.  Plan: Will follow up as previously scheduled  Tyre Beaver E. Rollene Rotunda, RN, BSN Nurse Care Coordinator [facility] / [facility] Care Management  [phone]

## 2019-03-03 NOTE — Telephone Encounter (Signed)
Called Santiago Glad. She said she received a message that we needed to coordinate something with her in regards to Mrs. Biscoe. I think this message is for [facility]. Told her to reach out to [employer].

## 2019-03-04 ENCOUNTER — Emergency Department: Payer: Medicare Other

## 2019-03-04 ENCOUNTER — Ambulatory Visit: Payer: Self-pay

## 2019-03-04 ENCOUNTER — Observation Stay
Admission: EM | Admit: 2019-03-04 | Discharge: 2019-03-08 | Disposition: A | Discharge status: Skilled Nursing Facility | Payer: Medicare Other | Attending: Internal Medicine | Admitting: Internal Medicine

## 2019-03-04 ENCOUNTER — Telehealth: Payer: Self-pay

## 2019-03-04 ENCOUNTER — Other Ambulatory Visit: Payer: Self-pay

## 2019-03-04 DIAGNOSIS — M797 Fibromyalgia: Secondary | ICD-10-CM | POA: Insufficient documentation

## 2019-03-04 DIAGNOSIS — Z791 Long term (current) use of non-steroidal anti-inflammatories (NSAID): Secondary | ICD-10-CM | POA: Insufficient documentation

## 2019-03-04 DIAGNOSIS — R2689 Other abnormalities of gait and mobility: Secondary | ICD-10-CM | POA: Diagnosis not present

## 2019-03-04 DIAGNOSIS — D320 Benign neoplasm of cerebral meninges: Secondary | ICD-10-CM | POA: Diagnosis not present

## 2019-03-04 DIAGNOSIS — R4701 Aphasia: Secondary | ICD-10-CM | POA: Diagnosis not present

## 2019-03-04 DIAGNOSIS — Z823 Family history of stroke: Secondary | ICD-10-CM | POA: Insufficient documentation

## 2019-03-04 DIAGNOSIS — R4781 Slurred speech: Principal | ICD-10-CM | POA: Insufficient documentation

## 2019-03-04 DIAGNOSIS — R0602 Shortness of breath: Secondary | ICD-10-CM | POA: Diagnosis not present

## 2019-03-04 DIAGNOSIS — R0600 Dyspnea, unspecified: Secondary | ICD-10-CM | POA: Diagnosis not present

## 2019-03-04 DIAGNOSIS — R0789 Other chest pain: Secondary | ICD-10-CM | POA: Diagnosis not present

## 2019-03-04 DIAGNOSIS — Z1159 Encounter for screening for other viral diseases: Secondary | ICD-10-CM | POA: Insufficient documentation

## 2019-03-04 DIAGNOSIS — F259 Schizoaffective disorder, unspecified: Secondary | ICD-10-CM | POA: Diagnosis not present

## 2019-03-04 DIAGNOSIS — E785 Hyperlipidemia, unspecified: Secondary | ICD-10-CM | POA: Insufficient documentation

## 2019-03-04 DIAGNOSIS — R1311 Dysphagia, oral phase: Secondary | ICD-10-CM | POA: Diagnosis not present

## 2019-03-04 DIAGNOSIS — J9601 Acute respiratory failure with hypoxia: Secondary | ICD-10-CM | POA: Diagnosis not present

## 2019-03-04 DIAGNOSIS — K219 Gastro-esophageal reflux disease without esophagitis: Secondary | ICD-10-CM | POA: Diagnosis not present

## 2019-03-04 DIAGNOSIS — Z9119 Patient's noncompliance with other medical treatment and regimen: Secondary | ICD-10-CM | POA: Insufficient documentation

## 2019-03-04 DIAGNOSIS — R200 Anesthesia of skin: Secondary | ICD-10-CM | POA: Diagnosis not present

## 2019-03-04 DIAGNOSIS — R296 Repeated falls: Secondary | ICD-10-CM | POA: Diagnosis not present

## 2019-03-04 DIAGNOSIS — E119 Type 2 diabetes mellitus without complications: Secondary | ICD-10-CM | POA: Diagnosis not present

## 2019-03-04 DIAGNOSIS — R0689 Other abnormalities of breathing: Secondary | ICD-10-CM | POA: Diagnosis not present

## 2019-03-04 DIAGNOSIS — Z7984 Long term (current) use of oral hypoglycemic drugs: Secondary | ICD-10-CM | POA: Insufficient documentation

## 2019-03-04 DIAGNOSIS — Z8673 Personal history of transient ischemic attack (TIA), and cerebral infarction without residual deficits: Secondary | ICD-10-CM | POA: Insufficient documentation

## 2019-03-04 DIAGNOSIS — R682 Dry mouth, unspecified: Secondary | ICD-10-CM

## 2019-03-04 DIAGNOSIS — I10 Essential (primary) hypertension: Secondary | ICD-10-CM | POA: Diagnosis not present

## 2019-03-04 DIAGNOSIS — R251 Tremor, unspecified: Secondary | ICD-10-CM | POA: Diagnosis not present

## 2019-03-04 DIAGNOSIS — Z79899 Other long term (current) drug therapy: Secondary | ICD-10-CM | POA: Insufficient documentation

## 2019-03-04 DIAGNOSIS — M069 Rheumatoid arthritis, unspecified: Secondary | ICD-10-CM | POA: Diagnosis not present

## 2019-03-04 DIAGNOSIS — G4730 Sleep apnea, unspecified: Secondary | ICD-10-CM | POA: Diagnosis not present

## 2019-03-04 DIAGNOSIS — F419 Anxiety disorder, unspecified: Secondary | ICD-10-CM | POA: Insufficient documentation

## 2019-03-04 DIAGNOSIS — G40909 Epilepsy, unspecified, not intractable, without status epilepticus: Secondary | ICD-10-CM | POA: Diagnosis not present

## 2019-03-04 DIAGNOSIS — E876 Hypokalemia: Secondary | ICD-10-CM

## 2019-03-04 DIAGNOSIS — G4733 Obstructive sleep apnea (adult) (pediatric): Secondary | ICD-10-CM | POA: Insufficient documentation

## 2019-03-04 DIAGNOSIS — J9621 Acute and chronic respiratory failure with hypoxia: Secondary | ICD-10-CM | POA: Insufficient documentation

## 2019-03-04 DIAGNOSIS — Z833 Family history of diabetes mellitus: Secondary | ICD-10-CM | POA: Insufficient documentation

## 2019-03-04 DIAGNOSIS — Z96652 Presence of left artificial knee joint: Secondary | ICD-10-CM | POA: Insufficient documentation

## 2019-03-04 DIAGNOSIS — Z7982 Long term (current) use of aspirin: Secondary | ICD-10-CM | POA: Insufficient documentation

## 2019-03-04 DIAGNOSIS — F200 Paranoid schizophrenia: Secondary | ICD-10-CM

## 2019-03-04 DIAGNOSIS — R0902 Hypoxemia: Secondary | ICD-10-CM

## 2019-03-04 DIAGNOSIS — I639 Cerebral infarction, unspecified: Secondary | ICD-10-CM

## 2019-03-04 DIAGNOSIS — Z888 Allergy status to other drugs, medicaments and biological substances status: Secondary | ICD-10-CM | POA: Insufficient documentation

## 2019-03-04 DIAGNOSIS — Z885 Allergy status to narcotic agent status: Secondary | ICD-10-CM | POA: Insufficient documentation

## 2019-03-04 DIAGNOSIS — N390 Urinary tract infection, site not specified: Secondary | ICD-10-CM | POA: Diagnosis not present

## 2019-03-04 DIAGNOSIS — E873 Alkalosis: Secondary | ICD-10-CM

## 2019-03-04 DIAGNOSIS — M6281 Muscle weakness (generalized): Secondary | ICD-10-CM | POA: Insufficient documentation

## 2019-03-04 DIAGNOSIS — Z9181 History of falling: Secondary | ICD-10-CM | POA: Diagnosis not present

## 2019-03-04 LAB — CBC WITH DIFFERENTIAL/PLATELET
Abs Immature Granulocytes: 0.01 10*3/uL (ref 0.00–0.07)
Basophils Absolute: 0.1 10*3/uL (ref 0.0–0.1)
Basophils Relative: 1 %
Eosinophils Absolute: 0 10*3/uL (ref 0.0–0.5)
Eosinophils Relative: 1 %
HCT: 32.6 % — ABNORMAL LOW (ref 36.0–46.0)
Hemoglobin: 11.4 g/dL — ABNORMAL LOW (ref 12.0–15.0)
Immature Granulocytes: 0 %
Lymphocytes Relative: 47 %
Lymphs Abs: 2.5 10*3/uL (ref 0.7–4.0)
MCH: 32.1 pg (ref 26.0–34.0)
MCHC: 35 g/dL (ref 30.0–36.0)
MCV: 91.8 fL (ref 80.0–100.0)
Monocytes Absolute: 0.4 10*3/uL (ref 0.1–1.0)
Monocytes Relative: 8 %
Neutro Abs: 2.3 10*3/uL (ref 1.7–7.7)
Neutrophils Relative %: 43 %
Platelets: 244 10*3/uL (ref 150–400)
RBC: 3.55 MIL/uL — ABNORMAL LOW (ref 3.87–5.11)
RDW: 14.5 % (ref 11.5–15.5)
WBC: 5.3 10*3/uL (ref 4.0–10.5)
nRBC: 0 % (ref 0.0–0.2)

## 2019-03-04 LAB — COMPREHENSIVE METABOLIC PANEL
ALT: 16 U/L (ref 0–44)
AST: 23 U/L (ref 15–41)
Albumin: 4 g/dL (ref 3.5–5.0)
Alkaline Phosphatase: 61 U/L (ref 38–126)
Anion gap: 14 (ref 5–15)
BUN: 27 mg/dL — ABNORMAL HIGH (ref 8–23)
CO2: 19 mmol/L — ABNORMAL LOW (ref 22–32)
Calcium: 9.1 mg/dL (ref 8.9–10.3)
Chloride: 106 mmol/L (ref 98–111)
Creatinine, Ser: 1.24 mg/dL — ABNORMAL HIGH (ref 0.44–1.00)
GFR calc Af Amer: 52 mL/min — ABNORMAL LOW (ref >60)
GFR calc non Af Amer: 45 mL/min — ABNORMAL LOW (ref >60)
Glucose, Bld: 104 mg/dL — ABNORMAL HIGH (ref 70–99)
Potassium: 3.6 mmol/L (ref 3.5–5.1)
Sodium: 139 mmol/L (ref 135–145)
Total Bilirubin: 1.6 mg/dL — ABNORMAL HIGH (ref 0.3–1.2)
Total Protein: 7.2 g/dL (ref 6.5–8.1)

## 2019-03-04 LAB — BLOOD GAS, ARTERIAL
Acid-base deficit: 3.8 mmol/L — ABNORMAL HIGH (ref 0.0–2.0)
Bicarbonate: 21.5 mmol/L (ref 20.0–28.0)
FIO2: 0.21
O2 Saturation: 95.9 %
Patient temperature: 37
pCO2 arterial: 39 mmHg (ref 32.0–48.0)
pH, Arterial: 7.35 (ref 7.350–7.450)
pO2, Arterial: 85 mmHg (ref 83.0–108.0)

## 2019-03-04 LAB — URINALYSIS, COMPLETE (UACMP) WITH MICROSCOPIC
Bilirubin Urine: NEGATIVE
Glucose, UA: NEGATIVE mg/dL
Hgb urine dipstick: NEGATIVE
Ketones, ur: 5 mg/dL — AB
Leukocytes,Ua: NEGATIVE
Nitrite: NEGATIVE
Protein, ur: NEGATIVE mg/dL
Specific Gravity, Urine: 1.028 (ref 1.005–1.030)
pH: 6 (ref 5.0–8.0)

## 2019-03-04 LAB — MAGNESIUM: Magnesium: 1.3 mg/dL — ABNORMAL LOW (ref 1.7–2.4)

## 2019-03-04 LAB — GLUCOSE, CAPILLARY: Glucose-Capillary: 90 mg/dL (ref 70–99)

## 2019-03-04 LAB — TROPONIN I (HIGH SENSITIVITY)
Troponin I (High Sensitivity): 3 ng/L (ref <18)
Troponin I (High Sensitivity): 3 ng/L (ref <18)

## 2019-03-04 LAB — BRAIN NATRIURETIC PEPTIDE: B Natriuretic Peptide: 7 pg/mL (ref 0.0–100.0)

## 2019-03-04 LAB — SARS CORONAVIRUS 2 BY RT PCR (HOSPITAL ORDER, PERFORMED IN ~~LOC~~ HOSPITAL LAB): SARS Coronavirus 2: NEGATIVE

## 2019-03-04 MED ORDER — TRAZODONE HCL 50 MG PO TABS
50.0000 mg | ORAL_TABLET | Freq: Every day | ORAL | Status: DC
  Administered 2019-03-04 – 2019-03-07 (×4): 50 mg via ORAL
  Filled 2019-03-04 (×4): qty 1

## 2019-03-04 MED ORDER — BIOTENE DRY MOUTH MOISTURIZING MT SOLN
1.0000 "application " | Freq: Two times a day (BID) | OROMUCOSAL | Status: DC | PRN
  Filled 2019-03-04: qty 10

## 2019-03-04 MED ORDER — IPRATROPIUM-ALBUTEROL 0.5-2.5 (3) MG/3ML IN SOLN
3.0000 mL | Freq: Three times a day (TID) | RESPIRATORY_TRACT | Status: AC
  Administered 2019-03-04 – 2019-03-05 (×3): 3 mL via RESPIRATORY_TRACT
  Filled 2019-03-04 (×3): qty 3

## 2019-03-04 MED ORDER — BENZTROPINE MESYLATE 1 MG PO TABS
1.0000 mg | ORAL_TABLET | Freq: Three times a day (TID) | ORAL | Status: DC
  Administered 2019-03-04 – 2019-03-08 (×11): 1 mg via ORAL
  Filled 2019-03-04 (×14): qty 1

## 2019-03-04 MED ORDER — STROKE: EARLY STAGES OF RECOVERY BOOK
Freq: Once | Status: AC
  Administered 2019-03-04: 23:00:00

## 2019-03-04 MED ORDER — INSULIN ASPART 100 UNIT/ML ~~LOC~~ SOLN
0.0000 [IU] | Freq: Three times a day (TID) | SUBCUTANEOUS | Status: DC
  Administered 2019-03-06: 17:00:00 2 [IU] via SUBCUTANEOUS
  Filled 2019-03-04: qty 1

## 2019-03-04 MED ORDER — LEVETIRACETAM 500 MG PO TABS
500.0000 mg | ORAL_TABLET | Freq: Two times a day (BID) | ORAL | Status: DC
  Administered 2019-03-04 – 2019-03-08 (×8): 500 mg via ORAL
  Filled 2019-03-04 (×9): qty 1

## 2019-03-04 MED ORDER — ATORVASTATIN CALCIUM 20 MG PO TABS
40.0000 mg | ORAL_TABLET | Freq: Every day | ORAL | Status: DC
  Administered 2019-03-04 – 2019-03-07 (×4): 40 mg via ORAL
  Filled 2019-03-04 (×4): qty 2

## 2019-03-04 MED ORDER — MAGNESIUM SULFATE 2 GM/50ML IV SOLN
2.0000 g | Freq: Once | INTRAVENOUS | Status: AC
  Administered 2019-03-04: 2 g via INTRAVENOUS
  Filled 2019-03-04: qty 50

## 2019-03-04 MED ORDER — SODIUM CHLORIDE 0.9 % IV SOLN
Freq: Once | INTRAVENOUS | Status: AC
  Administered 2019-03-04: 17:00:00 via INTRAVENOUS

## 2019-03-04 MED ORDER — ESCITALOPRAM OXALATE 10 MG PO TABS
10.0000 mg | ORAL_TABLET | Freq: Every day | ORAL | Status: DC
  Administered 2019-03-05 – 2019-03-08 (×4): 10 mg via ORAL
  Filled 2019-03-04 (×4): qty 1

## 2019-03-04 MED ORDER — BUDESONIDE 0.5 MG/2ML IN SUSP
0.5000 mg | Freq: Two times a day (BID) | RESPIRATORY_TRACT | Status: DC
  Administered 2019-03-04 – 2019-03-08 (×8): 0.5 mg via RESPIRATORY_TRACT
  Filled 2019-03-04 (×7): qty 2

## 2019-03-04 MED ORDER — IOHEXOL 350 MG/ML SOLN
75.0000 mL | Freq: Once | INTRAVENOUS | Status: AC | PRN
  Administered 2019-03-04: 75 mL via INTRAVENOUS

## 2019-03-04 MED ORDER — INSULIN ASPART 100 UNIT/ML ~~LOC~~ SOLN: 0.0000 [IU] | Freq: Every day | SUBCUTANEOUS | Status: DC

## 2019-03-04 MED ORDER — ENOXAPARIN SODIUM 40 MG/0.4ML ~~LOC~~ SOLN
40.0000 mg | SUBCUTANEOUS | Status: DC
  Administered 2019-03-04 – 2019-03-07 (×4): 40 mg via SUBCUTANEOUS
  Filled 2019-03-04 (×4): qty 0.4

## 2019-03-04 MED ORDER — HYDROCHLOROTHIAZIDE 25 MG PO TABS
25.0000 mg | ORAL_TABLET | Freq: Every day | ORAL | Status: DC
  Administered 2019-03-04 – 2019-03-08 (×4): 25 mg via ORAL
  Filled 2019-03-04 (×5): qty 1

## 2019-03-04 MED ORDER — LISINOPRIL 20 MG PO TABS
20.0000 mg | ORAL_TABLET | Freq: Every day | ORAL | Status: DC
  Administered 2019-03-04 – 2019-03-08 (×4): 20 mg via ORAL
  Filled 2019-03-04 (×5): qty 1

## 2019-03-04 MED ORDER — ALBUTEROL SULFATE (2.5 MG/3ML) 0.083% IN NEBU: 2.5000 mg | INHALATION_SOLUTION | RESPIRATORY_TRACT | Status: DC | PRN

## 2019-03-04 MED ORDER — VITAMIN C 500 MG PO TABS
500.0000 mg | ORAL_TABLET | Freq: Every day | ORAL | Status: DC
  Administered 2019-03-05 – 2019-03-08 (×4): 500 mg via ORAL
  Filled 2019-03-04 (×4): qty 1

## 2019-03-04 MED ORDER — ASPIRIN 325 MG PO TABS
325.0000 mg | ORAL_TABLET | Freq: Every day | ORAL | Status: DC
  Administered 2019-03-04 – 2019-03-08 (×5): 325 mg via ORAL
  Filled 2019-03-04 (×5): qty 1

## 2019-03-04 MED ORDER — ASPIRIN 300 MG RE SUPP: 300.0000 mg | Freq: Every day | RECTAL | Status: DC

## 2019-03-04 MED ORDER — ACETAMINOPHEN 325 MG PO TABS: 650.0000 mg | ORAL_TABLET | ORAL | Status: DC | PRN

## 2019-03-04 MED ORDER — PANTOPRAZOLE SODIUM 40 MG PO TBEC
40.0000 mg | DELAYED_RELEASE_TABLET | Freq: Every day | ORAL | Status: DC
  Administered 2019-03-05 – 2019-03-08 (×4): 40 mg via ORAL
  Filled 2019-03-04 (×4): qty 1

## 2019-03-04 MED ORDER — ACETAMINOPHEN 650 MG RE SUPP: 650.0000 mg | RECTAL | Status: DC | PRN

## 2019-03-04 MED ORDER — ACETAMINOPHEN 160 MG/5ML PO SOLN
650.0000 mg | ORAL | Status: DC | PRN
  Filled 2019-03-04: qty 20.3

## 2019-03-04 MED ORDER — LORAZEPAM 2 MG/ML IJ SOLN
0.5000 mg | Freq: Once | INTRAMUSCULAR | Status: AC
  Administered 2019-03-04: 0.5 mg via INTRAVENOUS
  Filled 2019-03-04: qty 1

## 2019-03-04 MED ORDER — GABAPENTIN 100 MG PO CAPS
100.0000 mg | ORAL_CAPSULE | Freq: Two times a day (BID) | ORAL | Status: DC
  Administered 2019-03-04 – 2019-03-08 (×8): 100 mg via ORAL
  Filled 2019-03-04 (×8): qty 1

## 2019-03-04 MED ORDER — SODIUM CHLORIDE 0.9 % IV SOLN
Freq: Once | INTRAVENOUS | Status: AC
  Administered 2019-03-04: 16:00:00 via INTRAVENOUS

## 2019-03-04 NOTE — Chronic Care Management (AMB) (Signed)
  Chronic Care Management   Note  03/04/2019 Name: Lauren [name] MRN: [id number] DOB: 10-19-1950  Care Coordination: CCM RN CM received an incoming call from Ms. Waldron Labs, RN with [name]. [name] wanted to inform CCM RN CM that she and colleague completed an unscheduled joint home visit the [facility] RN and a collaborative decision was made to call 911 as patient appeared dehydrated, unkept, and in an unsafe environment secondary to clutter and fall risk.   CCM RN CM collaborate with [facility] ED SW to discuss patients current situation incase patient was to be admitted. After reviewing ED notes, patient was found to have low magnesium and to be very anxious. She was treated with IV Mag and Ativan and discharged to home. CCM RN CM notified [name] who stated she and colleague would call [name] to facilitate visit to home later today.  [name] states she will collaborate with [place] team and [name] for patient placement as patient is agreeable to group home or ALF. RN CM offered assistance with any coordination that may need to occur prior to placement including FL2 completion.  [name] appreciative of the assistance and collaboration from RN CM today.  The care management team will reach out to the patient again over the next 7 days.     Witney Huie E. Rollene Rotunda, RN, BSN Nurse Care Coordinator [facility] / [facility] Care Management  [phone]

## 2019-03-04 NOTE — Progress Notes (Signed)
Family Meeting Note  Advance Directive:yes  Today a meeting took place with the Patient.  Patient is able to participate  The following clinical team members were present during this meeting:MD  The following were discussed:Patient's diagnosis: 68 y.o. female with a known history per below which includes noncompliance with CPAP for obstructive sleep apnea, poor historian due to schizophrenia, was found by home health nurse with complaints of slurred speech, numbness of her fingers and mouth, shortness of breath, dry mouth for 5 months, noted to be hypoxic with O2 saturation in the 80s, emergency room work-up was unimpressive, hospitalist asked to admit for further evaluation, patient is now being admitted for acute slurred speech with paresthesias, acute hypoxic respiratory failure suspected due to obstructive sleep apnea due to noncompliance with CPAP., Patient's progosis: Unable to determine and Goals for treatment: Full Code  Additional follow-up to be provided: prn  Time spent during discussion:20 minutes  Gorden Harms, MD

## 2019-03-04 NOTE — H&P (Signed)
[place] [facility] [place] NAME: Lauren [name]    MR#:  [id number]  DATE OF BIRTH:  Jul 26, 1951  DATE OF ADMISSION:  03/04/2019  PRIMARY CARE PHYSICIAN: Steele Sizer, MD   REQUESTING/REFERRING PHYSICIAN:   CHIEF COMPLAINT:   Chief Complaint  Patient presents with  . Shortness of Breath    HISTORY OF PRESENT ILLNESS: Lauren [name]  is a 68 y.o. female with a known history per below which includes noncompliance with CPAP due to broken equipment for 6 months for obstructive sleep apnea, schizophrenia, was found by home health nurse with complaints of slurred speech, numbness of her fingers and mouth-Per patient she has had numbness in these areas since September of last year, complains of shortness of breath, dry mouth for 5 months, noted to be hypoxic with O2 saturation in the 80s, emergency room work-up was unimpressive, hospitalist asked to admit for further evaluation, patient is now being admitted for acute slurred speech with chronic paresthesias, acute hypoxic respiratory failure suspected due to obstructive sleep apnea due to noncompliance with CPAP.  PAST MEDICAL HISTORY:   Past Medical History:  Diagnosis Date  . Anxiety   . Apnea, sleep 02/02/2014  . Awareness of heartbeats 02/02/2014  . Breathlessness on exertion 02/02/2014  . Diabetes mellitus   . Encounter for pre-employment examination 06/29/2015  . Excessive sweating 07/05/2015  . Fibromyalgia   . GERD (gastroesophageal reflux disease)   . Gravida 1 10/26/2015   1.    . Heart murmur   . Herniated disc   . Hyperlipidemia   . Hypertension   . Itch of skin 10/26/2015  . Lack of bladder control   . Lung tumor   . Rheumatoid arthritis ([place])   . Schizoaffective disorder ([place])   . Screening for cervical cancer 07/29/2017  . Seizure [facility])    after brain surgery 2012. last seizure 2013!  [name] Sex counseling 10/26/2015  . Sleep apnea   . 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 Stiffness of both knees 06/22/2015  . Stroke [facility])     PAST SURGICAL HISTORY:  Past Surgical History:  Procedure Laterality Date  . BRAIN TUMOR EXCISION  2012   benign  . CARDIAC CATHETERIZATION  2008  . CESAREAN SECTION    . CYST REMOVAL HAND    . LUNG LOBECTOMY  1977   benign tumor  . TOTAL KNEE ARTHROPLASTY Left 01/28/2017   Procedure: TOTAL KNEE ARTHROPLASTY;  Surgeon: Corky Mull, MD;  Location: [facility] ORS;  Service: Orthopedics;  Laterality: Left;    SOCIAL HISTORY:  Social History   Tobacco Use  . Smoking status: Never Smoker  . Smokeless tobacco: Never Used  Substance Use Topics  . Alcohol use: No    Alcohol/week: 0.0 standard drinks    FAMILY HISTORY:  Family History  Problem Relation Age of Onset  . Heart disease Brother   . Depression Mother   . Heart attack Mother   . Stroke Mother   . Alcohol abuse Father   . Stroke Father   . Diabetes Sister   . Diabetes Sister   . Stomach cancer Sister   . Kidney disease Sister   . COPD Brother   . Lung cancer Brother   . Diabetes Brother     DRUG ALLERGIES:  Allergies  Allergen Reactions  . Percocet [Oxycodone-Acetaminophen] Diarrhea, Nausea And Vomiting and Nausea Only  . Tramadol Hcl Diarrhea, Nausea And Vomiting and Nausea Only  .  Vicodin [Hydrocodone-Acetaminophen] Diarrhea, Nausea And Vomiting and Nausea Only    REVIEW OF SYSTEMS: Poor historian  CONSTITUTIONAL: No fever, fatigue or weakness.  EYES: No blurred or double vision.  EARS, NOSE, AND THROAT: No tinnitus or ear pain.  RESPIRATORY: No cough, +shortness of breath, no wheezing or hemoptysis.  CARDIOVASCULAR: No chest pain, orthopnea, edema.  GASTROINTESTINAL: No nausea, vomiting, diarrhea or abdominal pain.  GENITOURINARY: No dysuria, hematuria.  ENDOCRINE: No polyuria, nocturia,  HEMATOLOGY: No anemia, easy bruising or bleeding SKIN: No rash or lesion. MUSCULOSKELETAL: No joint pain or arthritis.   NEUROLOGIC:  No tingling, numbness, weakness.  PSYCHIATRY: No anxiety or depression.   MEDICATIONS AT HOME:  Prior to Admission medications   Medication Sig Start Date End Date Taking? Authorizing Provider  Ascorbic Acid 500 MG CHEW Chew 1 tablet by mouth daily.   Yes [provider]  aspirin EC 81 MG tablet Take 1 tablet (81 mg total) by mouth daily before breakfast. 04/01/18  Yes Sowles, Drue Stager, MD  atorvastatin (LIPITOR) 40 MG tablet Take 1 tablet (40 mg total) by mouth daily at 6 PM. 03/01/19  Yes Sowles, Drue Stager, MD  benztropine (COGENTIN) 1 MG tablet Take 1 tablet by mouth 3 (three) times daily. 01/25/19  Yes [provider]  escitalopram (LEXAPRO) 10 MG tablet Take 10 mg by mouth daily.    Yes [provider]  gabapentin (NEURONTIN) 100 MG capsule Take 100 mg by mouth 2 (two) times daily.   Yes [provider]  haloperidol decanoate (HALDOL DECANOATE) 100 MG/ML injection  01/13/19  Yes [provider]  hydrochlorothiazide (HYDRODIURIL) 25 MG tablet Take 1 tablet (25 mg total) by mouth daily. 03/01/19  Yes Sowles, Drue Stager, MD  levETIRAcetam (KEPPRA) 500 MG tablet Take 1 tablet by mouth 2 (two) times a day. 01/25/19  Yes [provider]  lisinopril (ZESTRIL) 20 MG tablet Take 1 tablet (20 mg total) by mouth daily. 03/01/19  Yes Sowles, Drue Stager, MD  metFORMIN (GLUCOPHAGE) 500 MG tablet Take 1 tablet (500 mg total) by mouth 2 (two) times daily with a meal. 03/01/19  Yes Sowles, Drue Stager, MD  omeprazole (PRILOSEC) 20 MG capsule Take 1 capsule (20 mg total) by mouth 2 (two) times daily. 12/04/18  Yes Sowles, Drue Stager, MD  traZODone (DESYREL) 50 MG tablet Take 1 tablet (50 mg total) by mouth at bedtime. 01/12/18  Yes McNew, Tyson Babinski, MD  Artificial Saliva (BIOTENE DRY MOUTH MOISTURIZING) SOLN Use as directed 1 application in the mouth or throat 2 (two) times daily. 09/28/18   Hubbard Hartshorn, FNP  Dentifrices (BIOTENE DRY MOUTH GENTLE) PSTE Place 1 application onto teeth  2 (two) times daily. 09/28/18   Hubbard Hartshorn, FNP  Elastic Bandages & Supports (KNEE COMPRESSION SLEEVE/L/XL) MISC 2 each by Does not apply route as needed. Wear during day; take off at night 05/12/18   Poulose, Bethel Born, NP  ibuprofen (ADVIL,MOTRIN) 200 MG tablet Take 600 mg by mouth every 8 (eight) hours as needed (for knee pain.).    [provider]      PHYSICAL EXAMINATION:   VITAL SIGNS: Blood pressure 116/77, pulse 72, temperature 98.2 F (36.8 C), resp. rate (!) 23, height 5' 4.5" (1.638 m), weight 99.8 kg, SpO2 100 %.  GENERAL:  68 y.o.-year-old patient lying in the bed with no acute distress.  Obese EYES: Pupils equal, round, reactive to light and accommodation. No scleral icterus. Extraocular muscles intact.  HEENT: Head atraumatic, normocephalic. Oropharynx and nasopharynx clear.  NECK:  Supple, no jugular venous distention. No thyroid enlargement, no tenderness.  LUNGS: Slightly diminished breath sounds bilaterally. No use of accessory muscles of respiration.  CARDIOVASCULAR: S1, S2 normal. No murmurs, rubs, or gallops.  ABDOMEN: Soft, nontender, nondistended. Bowel sounds present. No organomegaly or mass.  EXTREMITIES: No pedal edema, cyanosis, or clubbing.  NEUROLOGIC: Cranial nerves II through XII are intact. Muscle strength 5/5 in all extremities. Sensation intact. Gait not checked.  PSYCHIATRIC: The patient is alert and oriented x 3.  SKIN: No obvious rash, lesion, or ulcer.   LABORATORY PANEL:   CBC Recent Labs  Lab 03/04/19 1344  WBC 5.3  HGB 11.4*  HCT 32.6*  PLT 244  MCV 91.8  MCH 32.1  MCHC 35.0  RDW 14.5  LYMPHSABS 2.5  MONOABS 0.4  EOSABS 0.0  BASOSABS 0.1   ------------------------------------------------------------------------------------------------------------------  Chemistries  Recent Labs  Lab 02/26/19 1440 03/04/19 1344  NA 142 139  K 3.3* 3.6  CL 107 106  CO2 17* 19*  GLUCOSE 101* 104*  BUN 14 27*  CREATININE 1.16*  1.24*  CALCIUM 9.7 9.1  MG  --  1.3*  AST  --  23  ALT  --  16  ALKPHOS  --  61  BILITOT  --  1.6*   ------------------------------------------------------------------------------------------------------------------ estimated creatinine clearance is 51.8 mL/min (A) (by C-G formula based on SCr of 1.24 mg/dL (H)). ------------------------------------------------------------------------------------------------------------------ No results for input(s): TSH, T4TOTAL, T3FREE, THYROIDAB in the last 72 hours.  Invalid input(s): FREET3   Coagulation profile No results for input(s): INR, PROTIME in the last 168 hours. ------------------------------------------------------------------------------------------------------------------- No results for input(s): DDIMER in the last 72 hours. -------------------------------------------------------------------------------------------------------------------  Cardiac Enzymes No results for input(s): CKMB, TROPONINI, MYOGLOBIN in the last 168 hours.  Invalid input(s): CK ------------------------------------------------------------------------------------------------------------------ Invalid input(s): POCBNP  ---------------------------------------------------------------------------------------------------------------  Urinalysis    Component Value Date/Time   COLORURINE YELLOW (A) 02/15/2019 2055   APPEARANCEUR CLOUDY (A) 02/15/2019 2055   APPEARANCEUR Clear 11/08/2011 0036   LABSPEC >1.046 (H) 02/15/2019 2055   LABSPEC 1.026 11/08/2011 0036   PHURINE 8.0 02/15/2019 2055   GLUCOSEU NEGATIVE 02/15/2019 2055   GLUCOSEU Negative 11/08/2011 0036   HGBUR NEGATIVE 02/15/2019 2055   BILIRUBINUR NEGATIVE 02/15/2019 2055   BILIRUBINUR neg 12/09/2017 0942   BILIRUBINUR Negative 11/08/2011 0036   KETONESUR 20 (A) 02/15/2019 2055   PROTEINUR NEGATIVE 02/15/2019 2055   UROBILINOGEN 0.2 12/09/2017 0942   UROBILINOGEN 0.2 02/26/2012 0938   NITRITE  NEGATIVE 02/15/2019 2055   LEUKOCYTESUR MODERATE (A) 02/15/2019 2055   LEUKOCYTESUR Negative 11/08/2011 0036     RADIOLOGY: Ct Angio Chest Pe W And/or Wo Contrast  Result Date: 03/04/2019 CLINICAL DATA:  Labored breathing. EXAM: CT ANGIOGRAPHY CHEST WITH CONTRAST TECHNIQUE: Multidetector CT imaging of the chest was performed using the standard protocol during bolus administration of intravenous contrast. Multiplanar CT image reconstructions and MIPs were obtained to evaluate the vascular anatomy. CONTRAST:  71mL OMNIPAQUE IOHEXOL 350 MG/ML SOLN COMPARISON:  02/15/2019 FINDINGS: Cardiovascular: Cardiopericardial silhouette is at upper limits of normal for size. Tiny pericardial effusion noted. Coronary artery calcification is evident. Atherosclerotic calcification is noted in the wall of the thoracic aorta. No filling defect within the opacified pulmonary arteries to suggest the presence of an acute pulmonary embolus. Mediastinum/Nodes: 10 mm short axis subcarinal lymph node is upper normal and similar to prior. There is no hilar lymphadenopathy. There is no axillary lymphadenopathy. The esophagus has normal imaging features. Lungs/Pleura: Surgical clips noted in the left hilum. 3 mm subpleural nodule noted  anterior right upper lobe, stable since prior, but new since 07/11/2017. stable scarring medial right lower lobe since 2018. Upper Abdomen: The liver shows diffusely decreased attenuation suggesting fat deposition. Musculoskeletal: No worrisome lytic or sclerotic osseous abnormality. Review of the MIP images confirms the above findings. IMPRESSION: 1. No CT evidence for acute pulmonary embolus. 2. 3 mm anterior right upper lobe subpleural nodule. No follow-up needed if patient is low-risk. Non-contrast chest CT can be considered in 12 months if patient is high-risk. This recommendation follows the consensus statement: Guidelines for Management of Incidental Pulmonary Nodules Detected on CT Images: From  the Fleischner Society 2017; Radiology 2017; 284:228-243. Electronically Signed   By: Misty Stanley M.D.   On: 03/04/2019 18:41   Dg Chest Port 1 View  Result Date: 03/04/2019 CLINICAL DATA:  Shortness of breath EXAM: PORTABLE CHEST 1 VIEW COMPARISON:  02/16/2019, CT 02/15/2019 FINDINGS: No acute airspace disease or effusion. Postsurgical changes in the left hilus. Borderline cardiomegaly. No pneumothorax. IMPRESSION: No active disease.  Postsurgical changes on the left Electronically Signed   By: Donavan Foil M.D.   On: 03/04/2019 17:25    EKG: Orders placed or performed during the hospital encounter of 03/04/19  . EKG 12-Lead  . EKG 12-Lead    IMPRESSION AND PLAN: *Acute slurred speech with chronic paresthesias of the hand/mouth Refer to the observation unit on a TIA/CVA protocol, neurochecks per routine, check MRI of the brain for further evaluation, echocardiogram, carotid Dopplers, neurology to see, PT/OT/speech therapy to evaluate/treat, aspirin, statin therapy, continue close medical monitoring  *Acute hypoxic respiratory failure Suspected due to obstructive sleep apnea, obesity, noncompliance with CPAP or broken equipment at home Check blood gas, CPAP at bedtime/as needed, supplemental oxygen wean as tolerated, trial of breathing treatments  *Chronic obstructive sleep apnea Noncompliant with CPAP due to broken equipment for the last 6 months CPAP at bedtime/as needed Consult case management/social worker regarding replacement of CPAP at home status post discharge   *Diabetes type 2 Sliding scale insulin with Accu-Cheks per routine, hold metformin while in-house  *Chronic seizure disorder Stable Keppra  All the records are reviewed and case discussed with ED provider. Management plans discussed with the patient, family and they are in agreement.  CODE STATUS:full Code Status History    Date Active Date Inactive Code Status Order ID Comments User Context   02/16/2019 0119  02/16/2019 1858 Full Code [id number]  Mayer Camel, NP ED   11/22/2018 1704 11/24/2018 1852 DNR [id number]  Gorden Harms, MD Inpatient   12/29/2017 1820 01/13/2018 1427 Full Code [id number]  Gonzella Lex, MD Inpatient   11/04/2017 1622 11/18/2017 1542 Full Code [id number]  Gonzella Lex, MD Inpatient   09/08/2017 1517 09/19/2017 1646 Full Code [id number]  Gonzella Lex, MD Inpatient   01/28/2017 1106 01/30/2017 1824 Full Code [id number]  Poggi, Marshall Cork, MD Inpatient   Advance Care Planning Activity       TOTAL TIME TAKING CARE OF THIS PATIENT: 40 minutes.    Avel Peace Salary M.D on 03/04/2019   Between 7am to 6pm - Pager - [phone]  After 6pm go to [email or web address] - password [email or web address] [facility]  Office  [phone]  CC: Primary care physician; Steele Sizer, MD   Note: This dictation was prepared with Dragon dictation along with smaller phrase technology. Any transcriptional errors that result from this process are unintentional.

## 2019-03-04 NOTE — Telephone Encounter (Signed)
On lab order it was noted Potassium was going to be sent to her pharmacy. Please send to her pharmacy.

## 2019-03-04 NOTE — Progress Notes (Signed)
This encounter was created in error - please disregard.

## 2019-03-04 NOTE — ED Notes (Signed)
IV team at bedside 

## 2019-03-04 NOTE — ED Notes (Signed)
X-ray at bedside

## 2019-03-04 NOTE — Social Work (Signed)
CSW spoke with Trish Fountain, RN at [facility], and asked about patient being placed in an assisted living facility or group home. CSW informed Truddie Crumble that patient would have to have long-term care/special assistance medicaid. CSW was told that [name] will be involved in her case. [name] will help pt obtain Medicaid.    Fredric Mare, LCSW  [phone]  [facility] ED (Mon-Thurs)

## 2019-03-04 NOTE — ED Triage Notes (Addendum)
Patient was discovered with slurred speech by her home health nurses today. Patient presents with labored breaths. Patient's last known well was yesterday. She states that she has numbness in her fingers and mouth. She says her mouth has been dry for four to five months. Patient has hypokalemia and hypomagnesemia approximately 01/16/2019. Patient has not been using CPAP for 6 months. Patient has home health, pt/ot, and mental services.

## 2019-03-04 NOTE — ED Provider Notes (Signed)
[facility] Emergency Department Provider Note       Time seen: ----------------------------------------- 1:39 PM on 03/04/2019 -----------------------------------------   I have reviewed the triage vital signs and the nursing notes.  HISTORY   Chief Complaint Shortness of Breath   HPI Lauren [name] is a 68 y.o. female with a history of anxiety, diabetes, fibromyalgia, hyperlipidemia, hypertension, schizoaffective disorder who presents to the ED for slurred speech.  She was discovered with slurred speech according to her home health nurses today.  She presented tachypneic here, complains of numbness in her fingers in her mouth.  She reports her mouth has been dry for 4 to 5 months.  She had low potassium and low magnesium 6 weeks ago.  Past Medical History:  Diagnosis Date  . Anxiety   . Apnea, sleep 02/02/2014  . Awareness of heartbeats 02/02/2014  . Breathlessness on exertion 02/02/2014  . Diabetes mellitus   . Encounter for pre-employment examination 06/29/2015  . Excessive sweating 07/05/2015  . Fibromyalgia   . GERD (gastroesophageal reflux disease)   . Gravida 1 10/26/2015   1.    . Heart murmur   . Herniated disc   . Hyperlipidemia   . Hypertension   . Itch of skin 10/26/2015  . Lack of bladder control   . Lung tumor   . Rheumatoid arthritis ([place])   . Schizoaffective disorder ([place])   . Screening for cervical cancer 07/29/2017  . Seizure [facility])    after brain surgery 2012. last seizure 2013!  [name] Sex counseling 10/26/2015  . Sleep apnea   . 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 Stiffness of both knees 06/22/2015  . Stroke [facility])     Patient Active Problem List   Diagnosis Date Noted  . Chest pain 02/16/2019  . Chest pain in adult 02/15/2019  . Acute respiratory failure ([place]) 02/15/2019  . Seizures ([place]) 11/24/2018  . Paresthesias 11/22/2018  . Dry mouth 10/12/2018  . Hx of resection of meningioma  07/21/2018  . Tremor 07/21/2018  . Schizoaffective disorder ([place]) 12/29/2017  . Morbid obesity ([place]) 12/09/2017  . Meningioma ([place]) 11/25/2017  . Atherosclerosis of aorta ([place]) 11/25/2017  . Calcification of coronary artery 11/25/2017  . Schizophrenia ([place]) 11/04/2017  . Acid reflux 06/29/2015  . Diabetes mellitus type 2, controlled, without complications ([place]) 99/35/7017  . Cardiac murmur 06/29/2015  . Arthritis of knee, degenerative 06/28/2015  . Insomnia w/ sleep apnea 03/21/2015  . Anxiety disorder 03/21/2015  . Hypertension 03/21/2015  . HLD (hyperlipidemia) 03/21/2015  . Urge incontinence 03/21/2015    Past Surgical History:  Procedure Laterality Date  . BRAIN TUMOR EXCISION  2012   benign  . CARDIAC CATHETERIZATION  2008  . CESAREAN SECTION    . CYST REMOVAL HAND    . LUNG LOBECTOMY  1977   benign tumor  . TOTAL KNEE ARTHROPLASTY Left 01/28/2017   Procedure: TOTAL KNEE ARTHROPLASTY;  Surgeon: Corky Mull, MD;  Location: [facility] ORS;  Service: Orthopedics;  Laterality: Left;    Allergies Percocet [oxycodone-acetaminophen], Tramadol hcl, and Vicodin [hydrocodone-acetaminophen]  Social History Social History   Tobacco Use  . Smoking status: Never Smoker  . Smokeless tobacco: Never Used  Substance Use Topics  . Alcohol use: No    Alcohol/week: 0.0 standard drinks  . Drug use: No   Review of Systems Constitutional: Negative for fever. HEENT: Positive for dry mouth Cardiovascular: Negative for chest pain. Respiratory: Negative for shortness of breath. Gastrointestinal: Negative  for abdominal pain, vomiting and diarrhea. Musculoskeletal: Negative for back pain. Skin: Negative for rash. Neurological: Negative for headaches, focal weakness or numbness.  Positive for slurred speech  All systems negative/normal/unremarkable except as stated in the HPI  ____________________________________________   PHYSICAL EXAM:  VITAL SIGNS: ED Triage Vitals  Enc Vitals  Group     BP 03/04/19 1335 106/70     Pulse Rate 03/04/19 1335 60     Resp 03/04/19 1335 (!) 23     Temp 03/04/19 1335 98.2 F (36.8 C)     Temp src --      SpO2 03/04/19 1331 100 %     Weight 03/04/19 1338 220 lb (99.8 kg)     Height 03/04/19 1338 5' 4.5" (1.638 m)     Head Circumference --      Peak Flow --      Pain Score 03/04/19 1337 0     Pain Loc --      Pain Edu? --      Excl. in [place]? --    Constitutional: Alert and oriented.  Anxious, no distress Eyes: Conjunctivae are normal. Normal extraocular movements. ENT      Head: Normocephalic and atraumatic.      Nose: No congestion/rhinnorhea.      Mouth/Throat: Mucous membranes are dry      Neck: No stridor. Cardiovascular: Normal rate, regular rhythm. No murmurs, rubs, or gallops. Respiratory: Tachypnea with clear breath sounds Gastrointestinal: Soft and nontender. Normal bowel sounds Musculoskeletal: Nontender with normal range of motion in extremities. No lower extremity tenderness nor edema. Neurologic:  Normal speech and language. No gross focal neurologic deficits are appreciated.  Skin:  Skin is warm, dry and intact. No rash noted. Psychiatric: Anxious mood and affect ___________________________________________  ED COURSE:  As part of my medical decision making, I reviewed the following data within the [place] History obtained from family if available, nursing notes, old chart and ekg, as well as notes from prior ED visits. Patient presented for speech difficulty, we will assess with labs and imaging as indicated at this time.   Procedures  Lauren [name] was evaluated in Emergency Department on 03/04/2019 for the symptoms described in the history of present illness. She was evaluated in the context of the global COVID-19 pandemic, which necessitated consideration that the patient might be at risk for infection with the SARS-CoV-2 virus that causes COVID-19. Institutional protocols and  algorithms that pertain to the evaluation of patients at risk for COVID-19 are in a state of rapid change based on information released by regulatory bodies including the CDC and federal and state organizations. These policies and algorithms were followed during the patient's care in the ED.  ____________________________________________   LABS (pertinent positives/negatives)  Labs Reviewed  CBC WITH DIFFERENTIAL/PLATELET - Abnormal; Notable for the following components:      Result Value   RBC 3.55 (*)    Hemoglobin 11.4 (*)    HCT 32.6 (*)    All other components within normal limits  COMPREHENSIVE METABOLIC PANEL - Abnormal; Notable for the following components:   CO2 19 (*)    Glucose, Bld 104 (*)    BUN 27 (*)    Creatinine, Ser 1.24 (*)    Total Bilirubin 1.6 (*)    GFR calc non Af Amer 45 (*)    GFR calc Af Amer 52 (*)    All other components within normal limits  MAGNESIUM - Abnormal; Notable for the following  components:   Magnesium 1.3 (*)    All other components within normal limits  URINALYSIS, COMPLETE (UACMP) WITH MICROSCOPIC  CBG MONITORING, ED  ____________________________________________   DIFFERENTIAL DIAGNOSIS   Anxiety, medication side effect, schizoaffective disorder, electrolyte abnormality, dehydration  FINAL ASSESSMENT AND PLAN  Dry mouth, hypomagnesemia   Plan: The patient had presented for slurred speech and dry mouth. Patient's labs did reveal some hypomagnesemia.  She was given IV fluids and IV magnesium.  Clinically this appears to be an acute on chronic problem. She is also anxious when you talk to her and hyperventilates.  Overall she is cleared for outpatient follow-up.   Laurence Aly, MD    Note: This note was generated in part or whole with voice recognition software. Voice recognition is usually quite accurate but there are transcription errors that can and very often do occur. I apologize for any typographical errors that were not  detected and corrected.     Earleen Newport, MD 03/04/19 [phone]

## 2019-03-04 NOTE — ED Notes (Signed)
Pts oxygen saturation on RA dropping intermittently into the mid 80%. RN at bedside to talk to patient and she is not asleep but upon talking her oxygen saturation rises. Pt not placed on Oxygen at this time but  MD made aware and at bedside.

## 2019-03-04 NOTE — ED Notes (Signed)
ED TO INPATIENT HANDOFF REPORT  ED Nurse Name and Phone #: [phone]   S Name/Age/Gender Lauren [name] 68 y.o. female Room/Bed: ED08A/ED08A  Code Status   Code Status: Prior  Home/SNF/Other Home Patient oriented to: self, place, time and situation Is this baseline? Yes   Triage Complete: Triage complete  Chief Complaint sob  Triage Note Patient was discovered with slurred speech by her home health nurses today. Patient presents with labored breaths. Patient's last known well was yesterday. She states that she has numbness in her fingers and mouth. She says her mouth has been dry for four to five months. Patient has hypokalemia and hypomagnesemia approximately 01/16/2019. Patient has not been using CPAP for 6 months. Patient has home health, pt/ot, and mental services.   Allergies Allergies  Allergen Reactions  . Percocet [Oxycodone-Acetaminophen] Diarrhea, Nausea And Vomiting and Nausea Only  . Tramadol Hcl Diarrhea, Nausea And Vomiting and Nausea Only  . Vicodin [Hydrocodone-Acetaminophen] Diarrhea, Nausea And Vomiting and Nausea Only    Level of Care/Admitting Diagnosis ED Disposition    ED Disposition Condition [facility] Area: [place] [[id number]]  Level of Care: Med-Surg [16]  Covid Evaluation: N/A  Diagnosis: Slurred speech [phone]  Admitting Physician: Gorden Harms [[id number]]  Attending Physician: Gorden Harms [[id number]]  PT Class (Do Not Modify): Observation [104]  PT Acc Code (Do Not Modify): Observation [10022]       B Medical/Surgery History Past Medical History:  Diagnosis Date  . Anxiety   . Apnea, sleep 02/02/2014  . Awareness of heartbeats 02/02/2014  . Breathlessness on exertion 02/02/2014  . Diabetes mellitus   . Encounter for pre-employment examination 06/29/2015  . Excessive sweating 07/05/2015  . Fibromyalgia   . GERD (gastroesophageal reflux disease)   . Gravida 1 10/26/2015   1.     . Heart murmur   . Herniated disc   . Hyperlipidemia   . Hypertension   . Itch of skin 10/26/2015  . Lack of bladder control   . Lung tumor   . Rheumatoid arthritis ([place])   . Schizoaffective disorder ([place])   . Screening for cervical cancer 07/29/2017  . Seizure [facility])    after brain surgery 2012. last seizure 2013!  [name] Sex counseling 10/26/2015  . Sleep apnea   . 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 Stiffness of both knees 06/22/2015  . Stroke [facility])    Past Surgical History:  Procedure Laterality Date  . BRAIN TUMOR EXCISION  2012   benign  . CARDIAC CATHETERIZATION  2008  . CESAREAN SECTION    . CYST REMOVAL HAND    . LUNG LOBECTOMY  1977   benign tumor  . TOTAL KNEE ARTHROPLASTY Left 01/28/2017   Procedure: TOTAL KNEE ARTHROPLASTY;  Surgeon: Corky Mull, MD;  Location: [facility] ORS;  Service: Orthopedics;  Laterality: Left;     A IV Location/Drains/Wounds Patient Lines/Drains/Airways Status   Active Line/Drains/Airways    Name:   Placement date:   Placement time:   Site:   Days:   Peripheral IV 03/04/19 Left Antecubital   03/04/19    1534    Antecubital   less than 1   Incision (Closed) 01/28/17 Knee Left   01/28/17    0845     765          Intake/Output Last 24 hours  Intake/Output Summary (Last 24 hours) at 03/04/2019 1936 Last data filed at 03/04/2019 1704  Gross per 24 hour  Intake 50 ml  Output -  Net 50 ml    Labs/Imaging Results for orders placed or performed during the hospital encounter of 03/04/19 (from the past 48 hour(s))  CBC with Differential     Status: Abnormal   Collection Time: 03/04/19  1:44 PM  Result Value Ref Range   WBC 5.3 4.0 - 10.5 K/uL   RBC 3.55 (L) 3.87 - 5.11 MIL/uL   Hemoglobin 11.4 (L) 12.0 - 15.0 g/dL   HCT 32.6 (L) 36.0 - 46.0 %   MCV 91.8 80.0 - 100.0 fL   MCH 32.1 26.0 - 34.0 pg   MCHC 35.0 30.0 - 36.0 g/dL   RDW 14.5 11.5 - 15.5 %   Platelets 244 150 - 400 K/uL   nRBC 0.0 0.0 - 0.2 %    Neutrophils Relative % 43 %   Neutro Abs 2.3 1.7 - 7.7 K/uL   Lymphocytes Relative 47 %   Lymphs Abs 2.5 0.7 - 4.0 K/uL   Monocytes Relative 8 %   Monocytes Absolute 0.4 0.1 - 1.0 K/uL   Eosinophils Relative 1 %   Eosinophils Absolute 0.0 0.0 - 0.5 K/uL   Basophils Relative 1 %   Basophils Absolute 0.1 0.0 - 0.1 K/uL   Immature Granulocytes 0 %   Abs Immature Granulocytes 0.01 0.00 - 0.07 K/uL    Comment: Performed at [facility], [address] [place], [place] [address]  Comprehensive metabolic panel     Status: Abnormal   Collection Time: 03/04/19  1:44 PM  Result Value Ref Range   Sodium 139 135 - 145 mmol/L   Potassium 3.6 3.5 - 5.1 mmol/L   Chloride 106 98 - 111 mmol/L   CO2 19 (L) 22 - 32 mmol/L   Glucose, Bld 104 (H) 70 - 99 mg/dL   BUN 27 (H) 8 - 23 mg/dL   Creatinine, Ser 1.24 (H) 0.44 - 1.00 mg/dL   Calcium 9.1 8.9 - 10.3 mg/dL   Total Protein 7.2 6.5 - 8.1 g/dL   Albumin 4.0 3.5 - 5.0 g/dL   AST 23 15 - 41 U/L   ALT 16 0 - 44 U/L   Alkaline Phosphatase 61 38 - 126 U/L   Total Bilirubin 1.6 (H) 0.3 - 1.2 mg/dL   GFR calc non Af Amer 45 (L) >60 mL/min   GFR calc Af Amer 52 (L) >60 mL/min   Anion gap 14 5 - 15    Comment: Performed at [facility], [address] [place], [place] [address]  Magnesium     Status: Abnormal   Collection Time: 03/04/19  1:44 PM  Result Value Ref Range   Magnesium 1.3 (L) 1.7 - 2.4 mg/dL    Comment: Performed at [facility], [address] [place], [place] [address]  Brain natriuretic peptide     Status: None   Collection Time: 03/04/19  1:44 PM  Result Value Ref Range   B Natriuretic Peptide 7.0 0.0 - 100.0 pg/mL    Comment: Performed at [facility], [address] [place], [place] [address]  SARS Coronavirus 2 (CEPHEID - Performed in [place] hospital lab), Hosp Order     Status: None   Collection Time: 03/04/19  4:48 PM   Specimen: Nasopharyngeal Swab  Result Value Ref Range    SARS Coronavirus 2 NEGATIVE NEGATIVE    Comment: (NOTE) If result is NEGATIVE SARS-CoV-2 target nucleic acids are NOT DETECTED. The SARS-CoV-2  RNA is generally detectable in upper and lower  respiratory specimens during the acute phase of infection. The lowest  concentration of SARS-CoV-2 viral copies this assay can detect is 250  copies / mL. A negative result does not preclude SARS-CoV-2 infection  and should not be used as the sole basis for treatment or other  patient management decisions.  A negative result may occur with  improper specimen collection / handling, submission of specimen other  than nasopharyngeal swab, presence of viral mutation(s) within the  areas targeted by this assay, and inadequate number of viral copies  (<250 copies / mL). A negative result must be combined with clinical  observations, patient history, and epidemiological information. If result is POSITIVE SARS-CoV-2 target nucleic acids are DETECTED. The SARS-CoV-2 RNA is generally detectable in upper and lower  respiratory specimens dur ing the acute phase of infection.  Positive  results are indicative of active infection with SARS-CoV-2.  Clinical  correlation with patient history and other diagnostic information is  necessary to determine patient infection status.  Positive results do  not rule out bacterial infection or co-infection with other viruses. If result is PRESUMPTIVE POSTIVE SARS-CoV-2 nucleic acids MAY BE PRESENT.   A presumptive positive result was obtained on the submitted specimen  and confirmed on repeat testing.  While 2019 novel coronavirus  (SARS-CoV-2) nucleic acids may be present in the submitted sample  additional confirmatory testing may be necessary for epidemiological  and / or clinical management purposes  to differentiate between  SARS-CoV-2 and other Sarbecovirus currently known to infect humans.  If clinically indicated additional testing with an alternate test   methodology [phone]) is advised. The SARS-CoV-2 RNA is generally  detectable in upper and lower respiratory sp ecimens during the acute  phase of infection. The expected result is Negative. Fact Sheet for Patients:  StrictlyIdeas.no Fact Sheet for Healthcare Providers: BankingDealers.co.za This test is not yet approved or cleared by the [place] FDA and has been authorized for detection and/or diagnosis of SARS-CoV-2 by FDA under an Emergency Use Authorization (EUA).  This EUA will remain in effect (meaning this test can be used) for the duration of the COVID-19 declaration under Section 564(b)(1) of the Act, 21 U.S.C. section 360bbb-3(b)(1), unless the authorization is terminated or revoked sooner. Performed at [facility], [place], [place] [address]   Troponin I (High Sensitivity)     Status: None   Collection Time: 03/04/19  5:34 PM  Result Value Ref Range   Troponin I (High Sensitivity) 3 <18 ng/L    Comment: (NOTE) Elevated high sensitivity troponin I (hsTnI) values and significant  changes across serial measurements may suggest ACS but many other  chronic and acute conditions are known to elevate hsTnI results.  Refer to the "Links" section for chest pain algorithms and additional  guidance. Performed at [facility], [address] [place], [place] [address]    Ct Angio Chest Pe W And/or Wo Contrast  Result Date: 03/04/2019 CLINICAL DATA:  Labored breathing. EXAM: CT ANGIOGRAPHY CHEST WITH CONTRAST TECHNIQUE: Multidetector CT imaging of the chest was performed using the standard protocol during bolus administration of intravenous contrast. Multiplanar CT image reconstructions and MIPs were obtained to evaluate the vascular anatomy. CONTRAST:  78mL OMNIPAQUE IOHEXOL 350 MG/ML SOLN COMPARISON:  02/15/2019 FINDINGS: Cardiovascular: Cardiopericardial silhouette is at upper limits of normal  for size. Tiny pericardial effusion noted. Coronary artery calcification is evident. Atherosclerotic calcification is noted in the wall of  the thoracic aorta. No filling defect within the opacified pulmonary arteries to suggest the presence of an acute pulmonary embolus. Mediastinum/Nodes: 10 mm short axis subcarinal lymph node is upper normal and similar to prior. There is no hilar lymphadenopathy. There is no axillary lymphadenopathy. The esophagus has normal imaging features. Lungs/Pleura: Surgical clips noted in the left hilum. 3 mm subpleural nodule noted anterior right upper lobe, stable since prior, but new since 07/11/2017. stable scarring medial right lower lobe since 2018. Upper Abdomen: The liver shows diffusely decreased attenuation suggesting fat deposition. Musculoskeletal: No worrisome lytic or sclerotic osseous abnormality. Review of the MIP images confirms the above findings. IMPRESSION: 1. No CT evidence for acute pulmonary embolus. 2. 3 mm anterior right upper lobe subpleural nodule. No follow-up needed if patient is low-risk. Non-contrast chest CT can be considered in 12 months if patient is high-risk. This recommendation follows the consensus statement: Guidelines for Management of Incidental Pulmonary Nodules Detected on CT Images: From the Fleischner Society 2017; Radiology 2017; 284:228-243. Electronically Signed   By: Misty Stanley M.D.   On: 03/04/2019 18:41   Dg Chest Port 1 View  Result Date: 03/04/2019 CLINICAL DATA:  Shortness of breath EXAM: PORTABLE CHEST 1 VIEW COMPARISON:  02/16/2019, CT 02/15/2019 FINDINGS: No acute airspace disease or effusion. Postsurgical changes in the left hilus. Borderline cardiomegaly. No pneumothorax. IMPRESSION: No active disease.  Postsurgical changes on the left Electronically Signed   By: Donavan Foil M.D.   On: 03/04/2019 17:25    Pending Labs Unresulted Labs (From admission, onward)    Start     Ordered   03/04/19 1920  Blood gas, arterial   ONCE - STAT,   STAT     03/04/19 1919   03/04/19 1734  Troponin I (High Sensitivity)  STAT Now then every 2 hours,   STAT    Comments: Add on to previous  Question Answer Comment  Indication Other   Specify indication add on to previous      03/04/19 1734   03/04/19 1344  Urinalysis, Complete w Microscopic  (ALOC)  ONCE - STAT,   STAT     03/04/19 1343   Signed and Held  Hemoglobin A1c  Tomorrow morning,   R     Signed and Held   Signed and Held  Lipid panel  Tomorrow morning,   R    Comments: Fasting    Signed and Held   Signed and Held  CBC  (enoxaparin (LOVENOX)    CrCl >/= 30 ml/min)  Once,   R    Comments: Baseline for enoxaparin therapy IF NOT ALREADY DRAWN.  Notify MD if PLT < 100 K.    Signed and Held   Signed and Held  Creatinine, serum  (enoxaparin (LOVENOX)    CrCl >/= 30 ml/min)  Once,   R    Comments: Baseline for enoxaparin therapy IF NOT ALREADY DRAWN.    Signed and Held   Signed and Held  Creatinine, serum  (enoxaparin (LOVENOX)    CrCl >/= 30 ml/min)  Weekly,   R    Comments: while on enoxaparin therapy    Signed and Held          Vitals/Pain Today's Vitals   03/04/19 1646 03/04/19 1647 03/04/19 1648 03/04/19 1700  BP:    116/77  Pulse: 77 69 71 72  Resp: 15 (!) 25 (!) 22 (!) 23  Temp:      SpO2: 92% 100% 100% 100%  Weight:  Height:      PainSc:        Isolation Precautions No active isolations  Medications Medications  0.9 %  sodium chloride infusion ( Intravenous New Bag/Given 03/04/19 1701)  LORazepam (ATIVAN) injection 0.5 mg (0.5 mg Intravenous Given 03/04/19 1537)  magnesium sulfate IVPB 2 g 50 mL (0 g Intravenous Stopped 03/04/19 1704)  0.9 %  sodium chloride infusion ( Intravenous New Bag/Given 03/04/19 1545)  iohexol (OMNIPAQUE) 350 MG/ML injection 75 mL (75 mLs Intravenous Contrast Given 03/04/19 1808)    Mobility walks Moderate fall risk   Focused Assessments Pulmonary Assessment Handoff:  Lung sounds: Bilateral Breath  Sounds: Clear L Breath Sounds: Clear R Breath Sounds: Clear O2 Device: Nasal Cannula O2 Flow Rate (L/min): (S) 2 L/min     R Recommendations: See Admitting Provider Note  Report given to:   Additional Notes:
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
## 2019-03-05 ENCOUNTER — Observation Stay: Payer: Medicare Other

## 2019-03-05 ENCOUNTER — Ambulatory Visit: Payer: Self-pay

## 2019-03-05 ENCOUNTER — Other Ambulatory Visit: Payer: Self-pay

## 2019-03-05 DIAGNOSIS — G4733 Obstructive sleep apnea (adult) (pediatric): Secondary | ICD-10-CM | POA: Diagnosis not present

## 2019-03-05 DIAGNOSIS — E119 Type 2 diabetes mellitus without complications: Secondary | ICD-10-CM | POA: Diagnosis not present

## 2019-03-05 DIAGNOSIS — I6523 Occlusion and stenosis of bilateral carotid arteries: Secondary | ICD-10-CM | POA: Diagnosis not present

## 2019-03-05 DIAGNOSIS — R200 Anesthesia of skin: Secondary | ICD-10-CM

## 2019-03-05 DIAGNOSIS — J9601 Acute respiratory failure with hypoxia: Secondary | ICD-10-CM | POA: Diagnosis not present

## 2019-03-05 DIAGNOSIS — R202 Paresthesia of skin: Secondary | ICD-10-CM

## 2019-03-05 DIAGNOSIS — F419 Anxiety disorder, unspecified: Secondary | ICD-10-CM

## 2019-03-05 DIAGNOSIS — R4781 Slurred speech: Secondary | ICD-10-CM | POA: Diagnosis not present

## 2019-03-05 DIAGNOSIS — E873 Alkalosis: Secondary | ICD-10-CM

## 2019-03-05 DIAGNOSIS — D320 Benign neoplasm of cerebral meninges: Secondary | ICD-10-CM | POA: Diagnosis not present

## 2019-03-05 LAB — LIPID PANEL
Cholesterol: 114 mg/dL (ref 0–200)
HDL: 30 mg/dL — ABNORMAL LOW (ref >40)
LDL Cholesterol: 63 mg/dL (ref 0–99)
Total CHOL/HDL Ratio: 3.8 RATIO
Triglycerides: 105 mg/dL (ref <150)
VLDL: 21 mg/dL (ref 0–40)

## 2019-03-05 LAB — GLUCOSE, CAPILLARY
Glucose-Capillary: 91 mg/dL (ref 70–99)
Glucose-Capillary: 119 mg/dL — ABNORMAL HIGH (ref 70–99)
Glucose-Capillary: 94 mg/dL (ref 70–99)
Glucose-Capillary: 101 mg/dL — ABNORMAL HIGH (ref 70–99)

## 2019-03-05 MED ORDER — NYSTATIN 100000 UNIT/ML MT SUSP
5.0000 mL | Freq: Four times a day (QID) | OROMUCOSAL | Status: DC
  Administered 2019-03-05 – 2019-03-07 (×8): 500000 [IU] via ORAL
  Filled 2019-03-05 (×7): qty 5

## 2019-03-05 NOTE — Progress Notes (Signed)
OT Cancellation Note  Patient Details Name: Lauren [name] MRN: [id number] DOB: Nov 13, 1950   Cancelled Treatment:    Reason Eval/Treat Not Completed: Patient at procedure or test/ unavailable. Order received, chart reviewed. Pt out of room for testing. Will re-attempt OT evaluation at later date/time as pt is available and medically appropriate.  Jeni Salles, MPH, MS, OTR/L ascom [phone] 03/05/19, 8:26 AM

## 2019-03-05 NOTE — Discharge Summary (Addendum)
[place] [facility] [place] NAME: Lauren [name]    MR#:  [id number]  DATE OF BIRTH:  1951-06-15  DATE OF ADMISSION:  03/04/2019 ADMITTING PHYSICIAN: Gorden Harms, MD  DATE OF DISCHARGE: 03/08/2019  PRIMARY CARE PHYSICIAN: Steele Sizer, MD    ADMISSION DIAGNOSIS:  Dry mouth [R68.2] Hypomagnesemia [E83.42] SOB (shortness of breath) [R06.02] Hypoxia [R09.02]  DISCHARGE DIAGNOSIS:  Active Problems:   Slurred speech   SECONDARY DIAGNOSIS:   Past Medical History:  Diagnosis Date  . Anxiety   . Apnea, sleep 02/02/2014  . Awareness of heartbeats 02/02/2014  . Breathlessness on exertion 02/02/2014  . Diabetes mellitus   . Encounter for pre-employment examination 06/29/2015  . Excessive sweating 07/05/2015  . Fibromyalgia   . GERD (gastroesophageal reflux disease)   . Gravida 1 10/26/2015   1.    . Heart murmur   . Herniated disc   . Hyperlipidemia   . Hypertension   . Itch of skin 10/26/2015  . Lack of bladder control   . Lung tumor   . Rheumatoid arthritis ([place])   . Schizoaffective disorder ([place])   . Screening for cervical cancer 07/29/2017  . Seizure [facility])    after brain surgery 2012. last seizure 2013!  [name] Sex counseling 10/26/2015  . Sleep apnea   . 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 Stiffness of both knees 06/22/2015  . Stroke [facility])     HOSPITAL COURSE:  68 year old female with history of diabetes, morbid obesity and sleep apnea with broken CPAP machine for over 6 months who was failed by her home health nurse with complaints of slurred speech and numbness of her right arm.  1.  Acute on chronic hypoxic respiratory failure due to underlying sleep apnea with noncompliance of broken CPAP She will require O2 at discharge during the day.  She will need to continue CPAP at night.  2.  Sleep apnea: Patient will need CPAP at night.    3.  Slurred speech with chronic paresthesia: Patient was  evaluated by neurology.  CVA work-up essentially unremarkable.  MRI negative for acute CVA. Patient needs EMG as an outpatient by her neurologist as per recommendations by neurology.  4.  Diabetes: Patient will continue outpatient regimen with ADA diet  5.  Seizure disorder: Continue Keppra  Stable for discharge to SNF   DISCHARGE CONDITIONS AND DIET:   Stable Diabetic diet  CONSULTS OBTAINED:  Treatment Team:  Leotis Pain, MD  DRUG ALLERGIES:   Allergies  Allergen Reactions  . Percocet [Oxycodone-Acetaminophen] Diarrhea, Nausea And Vomiting and Nausea Only  . Tramadol Hcl Diarrhea, Nausea And Vomiting and Nausea Only  . Vicodin [Hydrocodone-Acetaminophen] Diarrhea, Nausea And Vomiting and Nausea Only    DISCHARGE MEDICATIONS:   Allergies as of 03/05/2019      Reactions   Percocet [oxycodone-acetaminophen] Diarrhea, Nausea And Vomiting, Nausea Only   Tramadol Hcl Diarrhea, Nausea And Vomiting, Nausea Only   Vicodin [hydrocodone-acetaminophen] Diarrhea, Nausea And Vomiting, Nausea Only      Medication List    TAKE these medications   Ascorbic Acid 500 MG Chew Chew 1 tablet by mouth daily.   aspirin EC 81 MG tablet Take 1 tablet (81 mg total) by mouth daily before breakfast.   atorvastatin 40 MG tablet Commonly known as: LIPITOR Take 1 tablet (40 mg total) by mouth daily at 6 PM.   benztropine 1 MG tablet Commonly known as: COGENTIN Take 1 tablet by mouth  3 (three) times daily.   Biotene Dry Mouth Gentle Pste Place 1 application onto teeth 2 (two) times daily.   Biotene Dry Mouth Moisturizing Soln Use as directed 1 application in the mouth or throat 2 (two) times daily.   escitalopram 10 MG tablet Commonly known as: LEXAPRO Take 10 mg by mouth daily.   gabapentin 100 MG capsule Commonly known as: NEURONTIN Take 100 mg by mouth 2 (two) times daily.   haloperidol decanoate 100 MG/ML injection Commonly known as: HALDOL DECANOATE    hydrochlorothiazide 25 MG tablet Commonly known as: HYDRODIURIL Take 1 tablet (25 mg total) by mouth daily.   ibuprofen 200 MG tablet Commonly known as: ADVIL Take 600 mg by mouth every 8 (eight) hours as needed (for knee pain.).   Knee Compression Sleeve/L/XL Misc 2 each by Does not apply route as needed. Wear during day; take off at night   levETIRAcetam 500 MG tablet Commonly known as: KEPPRA Take 1 tablet by mouth 2 (two) times a day.   lisinopril 20 MG tablet Commonly known as: ZESTRIL Take 1 tablet (20 mg total) by mouth daily.   metFORMIN 500 MG tablet Commonly known as: GLUCOPHAGE Take 1 tablet (500 mg total) by mouth 2 (two) times daily with a meal.   omeprazole 20 MG capsule Commonly known as: PRILOSEC Take 1 capsule (20 mg total) by mouth 2 (two) times daily.   traZODone 50 MG tablet Commonly known as: DESYREL Take 1 tablet (50 mg total) by mouth at bedtime.            Durable Medical Equipment  (From admission, onward)         Start     Ordered   03/05/19 1126  DME Oxygen  Once    Question Answer Comment  Length of Need Lifetime   Mode or (Route) Nasal cannula   Liters per Minute 2   Frequency Continuous (stationary and portable oxygen unit needed)   Oxygen conserving device Yes   Oxygen delivery system Gas      03/05/19 1126            Today   CHIEF COMPLAINT:  Doing better this am No focal neurological deficits   VITAL SIGNS:  Blood pressure 96/71, pulse 75, temperature 97.7 F (36.5 C), resp. rate 20, height 5\' 5"  (1.651 m), weight 100.5 kg, SpO2 100 %.   REVIEW OF SYSTEMS:  Review of Systems  Constitutional: Negative.  Negative for chills, fever and malaise/fatigue.  HENT: Negative.  Negative for ear discharge, ear pain, hearing loss, nosebleeds and sore throat.   Eyes: Negative.  Negative for blurred vision and pain.  Respiratory: Negative.  Negative for cough, hemoptysis, shortness of breath and wheezing.    Cardiovascular: Negative.  Negative for chest pain, palpitations and leg swelling.  Gastrointestinal: Negative.  Negative for abdominal pain, blood in stool, diarrhea, nausea and vomiting.  Genitourinary: Negative.  Negative for dysuria.  Musculoskeletal: Negative.  Negative for back pain.  Skin: Negative.   Neurological: Negative for dizziness, tremors, speech change, focal weakness, seizures and headaches.  Endo/Heme/Allergies: Negative.  Does not bruise/bleed easily.  Psychiatric/Behavioral: Negative.  Negative for depression, hallucinations and suicidal ideas.     PHYSICAL EXAMINATION:  GENERAL:  68 y.o.-year-old patient lying in the bed with no acute distress.  LUNGS: Normal breath sounds bilaterally CARDIOVASCULAR: S1, S2 ABDOMEN: Soft, non-tender, non-distended. Bowel sounds present. EXTREMITIES: No pedal edema, cyanosis PSYCHIATRIC: The patient is alert and oriented Neuro - Stuttering speech. Motor  5-/5 bilaterally  DATA REVIEW:   CBC Recent Labs  Lab 03/04/19 1344  WBC 5.3  HGB 11.4*  HCT 32.6*  PLT 244    Chemistries  Recent Labs  Lab 03/04/19 1344  NA 139  K 3.6  CL 106  CO2 19*  GLUCOSE 104*  BUN 27*  CREATININE 1.24*  CALCIUM 9.1  MG 1.3*  AST 23  ALT 16  ALKPHOS 61  BILITOT 1.6*    Cardiac Enzymes No results for input(s): TROPONINI in the last 168 hours.  Microbiology Results  @[email or web address] @  RADIOLOGY:  Ct Angio Chest Pe W And/or Wo Contrast  Result Date: 03/04/2019 CLINICAL DATA:  Labored breathing. EXAM: CT ANGIOGRAPHY CHEST WITH CONTRAST TECHNIQUE: Multidetector CT imaging of the chest was performed using the standard protocol during bolus administration of intravenous contrast. Multiplanar CT image reconstructions and MIPs were obtained to evaluate the vascular anatomy. CONTRAST:  24mL OMNIPAQUE IOHEXOL 350 MG/ML SOLN COMPARISON:  02/15/2019 FINDINGS: Cardiovascular: Cardiopericardial silhouette is at upper limits of normal for size.  Tiny pericardial effusion noted. Coronary artery calcification is evident. Atherosclerotic calcification is noted in the wall of the thoracic aorta. No filling defect within the opacified pulmonary arteries to suggest the presence of an acute pulmonary embolus. Mediastinum/Nodes: 10 mm short axis subcarinal lymph node is upper normal and similar to prior. There is no hilar lymphadenopathy. There is no axillary lymphadenopathy. The esophagus has normal imaging features. Lungs/Pleura: Surgical clips noted in the left hilum. 3 mm subpleural nodule noted anterior right upper lobe, stable since prior, but new since 07/11/2017. stable scarring medial right lower lobe since 2018. Upper Abdomen: The liver shows diffusely decreased attenuation suggesting fat deposition. Musculoskeletal: No worrisome lytic or sclerotic osseous abnormality. Review of the MIP images confirms the above findings. IMPRESSION: 1. No CT evidence for acute pulmonary embolus. 2. 3 mm anterior right upper lobe subpleural nodule. No follow-up needed if patient is low-risk. Non-contrast chest CT can be considered in 12 months if patient is high-risk. This recommendation follows the consensus statement: Guidelines for Management of Incidental Pulmonary Nodules Detected on CT Images: From the Fleischner Society 2017; Radiology 2017; 284:228-243. Electronically Signed   By: Misty Stanley M.D.   On: 03/04/2019 18:41   US Carotid Bilateral (at [facility] Only)  Result Date: 03/05/2019 CLINICAL DATA:  CVA. History of hypertension, hyperlipidemia and diabetes. EXAM: BILATERAL CAROTID DUPLEX ULTRASOUND TECHNIQUE: Pearline Cables scale imaging, color Doppler and duplex ultrasound were performed of bilateral carotid and vertebral arteries in the neck. COMPARISON:  11/22/2018 FINDINGS: Criteria: Quantification of carotid stenosis is based on velocity parameters that correlate the residual internal carotid diameter with NASCET-based stenosis levels, using the diameter of  the distal internal carotid lumen as the denominator for stenosis measurement. The following velocity measurements were obtained: RIGHT ICA: 105/31 cm/sec CCA: 237/62 cm/sec SYSTOLIC ICA/CCA RATIO:  0.8 ECA: 201 cm/sec LEFT ICA: 126/40 cm/sec CCA: 831/51 cm/sec SYSTOLIC ICA/CCA RATIO:  0.8 ECA: 166 cm/sec RIGHT CAROTID ARTERY: There is a minimal to moderate amount of eccentric mixed echogenic plaque within the right carotid bulb (image 16), extending to involve the origin and proximal aspects of the right internal carotid artery (image 24), morphologically unchanged compared to the 11/2018 examination, though not definitely resulting in elevated peak systolic velocities within the internal carotid artery to suggest a hemodynamically significant stenosis. RIGHT VERTEBRAL ARTERY:  Antegrade Flow LEFT CAROTID ARTERY: There is a minimal to moderate amount of eccentric echogenic plaque involving the left carotid bulb, extending  to involve the origin and proximal aspects of the left internal carotid artery (image 58), morphologically unchanged compared to the 11/2018 examination and again results in elevated peak systolic velocities within the left internal carotid artery. Greatest acquired peak systolic velocity within the distal aspect of the left ICA measures 143 cm/sec (image 67) previously, greatest acquired peak systolic velocity within the mid left ICA measured 127 cm/sec. LEFT VERTEBRAL ARTERY:  Antegrade flow IMPRESSION: 1. Minimal to moderate amount of left-sided atherosclerotic plaque again results in elevated peak systolic velocities within left internal carotid artery compatible with the 50-69% luminal narrowing range. Further evaluation with CTA could performed as clinically indicated. 2. Minimal to moderate amount of right-sided atherosclerotic plaque, morphologically similar to the 11/2018 examination though not definitely resulting in a hemodynamically significant stenosis. Electronically Signed   By:  Sandi Mariscal M.D.   On: 03/05/2019 10:05   Dg Chest Port 1 View  Result Date: 03/04/2019 CLINICAL DATA:  Shortness of breath EXAM: PORTABLE CHEST 1 VIEW COMPARISON:  02/16/2019, CT 02/15/2019 FINDINGS: No acute airspace disease or effusion. Postsurgical changes in the left hilus. Borderline cardiomegaly. No pneumothorax. IMPRESSION: No active disease.  Postsurgical changes on the left Electronically Signed   By: Donavan Foil M.D.   On: 03/04/2019 17:25      Allergies as of 03/05/2019      Reactions   Percocet [oxycodone-acetaminophen] Diarrhea, Nausea And Vomiting, Nausea Only   Tramadol Hcl Diarrhea, Nausea And Vomiting, Nausea Only   Vicodin [hydrocodone-acetaminophen] Diarrhea, Nausea And Vomiting, Nausea Only      Medication List    TAKE these medications   Ascorbic Acid 500 MG Chew Chew 1 tablet by mouth daily.   aspirin EC 81 MG tablet Take 1 tablet (81 mg total) by mouth daily before breakfast.   atorvastatin 40 MG tablet Commonly known as: LIPITOR Take 1 tablet (40 mg total) by mouth daily at 6 PM.   benztropine 1 MG tablet Commonly known as: COGENTIN Take 1 tablet by mouth 3 (three) times daily.   Biotene Dry Mouth Gentle Pste Place 1 application onto teeth 2 (two) times daily.   Biotene Dry Mouth Moisturizing Soln Use as directed 1 application in the mouth or throat 2 (two) times daily.   escitalopram 10 MG tablet Commonly known as: LEXAPRO Take 10 mg by mouth daily.   gabapentin 100 MG capsule Commonly known as: NEURONTIN Take 100 mg by mouth 2 (two) times daily.   haloperidol decanoate 100 MG/ML injection Commonly known as: HALDOL DECANOATE   hydrochlorothiazide 25 MG tablet Commonly known as: HYDRODIURIL Take 1 tablet (25 mg total) by mouth daily.   ibuprofen 200 MG tablet Commonly known as: ADVIL Take 600 mg by mouth every 8 (eight) hours as needed (for knee pain.).   Knee Compression Sleeve/L/XL Misc 2 each by Does not apply route as needed.  Wear during day; take off at night   levETIRAcetam 500 MG tablet Commonly known as: KEPPRA Take 1 tablet by mouth 2 (two) times a day.   lisinopril 20 MG tablet Commonly known as: ZESTRIL Take 1 tablet (20 mg total) by mouth daily.   metFORMIN 500 MG tablet Commonly known as: GLUCOPHAGE Take 1 tablet (500 mg total) by mouth 2 (two) times daily with a meal.   omeprazole 20 MG capsule Commonly known as: PRILOSEC Take 1 capsule (20 mg total) by mouth 2 (two) times daily.   traZODone 50 MG tablet Commonly known as: DESYREL Take 1 tablet (50 mg  total) by mouth at bedtime.            Durable Medical Equipment  (From admission, onward)         Start     Ordered   03/05/19 1126  DME Oxygen  Once    Question Answer Comment  Length of Need Lifetime   Mode or (Route) Nasal cannula   Liters per Minute 2   Frequency Continuous (stationary and portable oxygen unit needed)   Oxygen conserving device Yes   Oxygen delivery system Gas      03/05/19 1126            Management plans discussed with the patient and she is in agreement. Stable for discharge   Patient should follow up with neurology for EMG  CODE STATUS:     Code Status Orders  (From admission, onward)         Start     Ordered   03/04/19 2038  Full code  Continuous     03/04/19 2037        Code Status History    Date Active Date Inactive Code Status Order ID Comments User Context   02/16/2019 0119 02/16/2019 1858 Full Code [id number]  Mayer Camel, NP ED   11/22/2018 1704 11/24/2018 1852 DNR [id number]  Gorden Harms, MD Inpatient   12/29/2017 1820 01/13/2018 1427 Full Code [id number]  Gonzella Lex, MD Inpatient   11/04/2017 1622 11/18/2017 1542 Full Code [id number]  Gonzella Lex, MD Inpatient   09/08/2017 1517 09/19/2017 1646 Full Code [id number]  Gonzella Lex, MD Inpatient   01/28/2017 1106 01/30/2017 1824 Full Code [id number]  Poggi, Marshall Cork, MD Inpatient   Advance Care Planning Activity       TOTAL TIME TAKING CARE OF THIS PATIENT: 38 minutes.    Note: This dictation was prepared with Dragon dictation along with smaller phrase technology. Any transcriptional errors that result from this process are unintentional.  Neita Carp M.D on 03/08/2019 at 11:08 AM  Between 7am to 6pm - Pager - [phone]  After 6pm go to [email or web address] - password [email or web address] [facility]  Office  [phone]  CC: Primary care physician; Steele Sizer, MD

## 2019-03-05 NOTE — NC FL2 (Signed)
[place] LEVEL OF CARE SCREENING TOOL     IDENTIFICATION  Patient Name: Lauren [name] Birthdate: 1951-07-16 Sex: female Admission Date (Current Location): 03/04/2019  [place] and [place] Number:  Engineering geologist and Address:  [facility], [address], [place], [place] [address]      Provider Number: [id number]  Attending Physician Name and Address:  Bettey Costa, MD  Relative Name and Phone Number:  Faustino Congress [phone]  [phone]    Current Level of Care: Hospital Recommended Level of Care: [place] Prior Approval Number:    Date Approved/Denied:   PASRR Number: [id number]  Discharge Plan: SNF    Current Diagnoses: Patient Active Problem List   Diagnosis Date Noted  . Slurred speech 03/04/2019  . Chest pain 02/16/2019  . Chest pain in adult 02/15/2019  . Acute respiratory failure ([place]) 02/15/2019  . Seizures ([place]) 11/24/2018  . Paresthesias 11/22/2018  . Dry mouth 10/12/2018  . Hx of resection of meningioma 07/21/2018  . Tremor 07/21/2018  . Schizoaffective disorder ([place]) 12/29/2017  . Morbid obesity ([place]) 12/09/2017  . Meningioma ([place]) 11/25/2017  . Atherosclerosis of aorta ([place]) 11/25/2017  . Calcification of coronary artery 11/25/2017  . Schizophrenia ([place]) 11/04/2017  . Acid reflux 06/29/2015  . Diabetes mellitus type 2, controlled, without complications ([place]) 78/93/8101  . Cardiac murmur 06/29/2015  . Arthritis of knee, degenerative 06/28/2015  . Insomnia w/ sleep apnea 03/21/2015  . Anxiety disorder 03/21/2015  . Hypertension 03/21/2015  . HLD (hyperlipidemia) 03/21/2015  . Urge incontinence 03/21/2015    Orientation RESPIRATION BLADDER Height & Weight     Self, Time, Situation, Place  Normal Continent Weight: 100.5 kg Height:  5\' 5"  (165.1 cm)  BEHAVIORAL SYMPTOMS/MOOD NEUROLOGICAL BOWEL NUTRITION STATUS      Continent Diet  AMBULATORY STATUS COMMUNICATION  OF NEEDS Skin   Limited Assist Verbally Bruising, Skin abrasions                       Personal Care Assistance Level of Assistance  Bathing, Dressing, Total care, Feeding Bathing Assistance: Limited assistance Feeding assistance: Limited assistance Dressing Assistance: Limited assistance Total Care Assistance: Limited assistance   Functional Limitations Info  Sight, Speech, Hearing Sight Info: Adequate Hearing Info: Adequate Speech Info: Adequate    SPECIAL CARE FACTORS FREQUENCY  PT (By licensed PT), OT (By licensed OT)     PT Frequency: 5x per week OT Frequency: 5x per week            Contractures Contractures Info: Not present    Additional Factors Info  Code Status, Allergies Code Status Info: full code Allergies Info: percocet, vicodin, tramdaol           Current Medications (03/05/2019):  This is the current hospital active medication list Current Facility-Administered Medications  Medication Dose Route Frequency Provider Last Rate Last Dose  . acetaminophen (TYLENOL) tablet 650 mg  650 mg Oral Q4H PRN Salary, Montell D, MD       Or  . acetaminophen (TYLENOL) solution 650 mg  650 mg Per Tube Q4H PRN Salary, Montell D, MD       Or  . acetaminophen (TYLENOL) suppository 650 mg  650 mg Rectal Q4H PRN Salary, Montell D, MD      . albuterol (PROVENTIL) (2.5 MG/3ML) 0.083% nebulizer solution 2.5 mg  2.5 mg Nebulization Q2H PRN Salary, Montell D, MD      . aspirin suppository 300 mg  300 mg Rectal Daily Salary, Montell D, MD       Or  . aspirin tablet 325 mg  325 mg Oral Daily Salary, Montell D, MD   325 mg at 03/05/19 0954  . atorvastatin (LIPITOR) tablet 40 mg  40 mg Oral q1800 Salary, Holly Bodily D, MD   40 mg at 03/04/19 2319  . benztropine (COGENTIN) tablet 1 mg  1 mg Oral TID Loney Hering D, MD   1 mg at 03/05/19 0955  . Biotene Dry Mouth Moisturizing SOLN 1 application  1 application Mouth/Throat BID PRN Salary, Montell D, MD      . budesonide  (PULMICORT) nebulizer solution 0.5 mg  0.5 mg Nebulization BID Salary, Montell D, MD   0.5 mg at 03/05/19 0807  . enoxaparin (LOVENOX) injection 40 mg  40 mg Subcutaneous Q24H Salary, Montell D, MD   40 mg at 03/04/19 2312  . escitalopram (LEXAPRO) tablet 10 mg  10 mg Oral Daily Salary, Montell D, MD   10 mg at 03/05/19 0955  . gabapentin (NEURONTIN) capsule 100 mg  100 mg Oral BID Loney Hering D, MD   100 mg at 03/05/19 0955  . hydrochlorothiazide (HYDRODIURIL) tablet 25 mg  25 mg Oral Daily Salary, Montell D, MD   25 mg at 03/05/19 0955  . insulin aspart (novoLOG) injection 0-15 Units  0-15 Units Subcutaneous TID WC Salary, Montell D, MD      . insulin aspart (novoLOG) injection 0-5 Units  0-5 Units Subcutaneous QHS Salary, Montell D, MD      . ipratropium-albuterol (DUONEB) 0.5-2.5 (3) MG/3ML nebulizer solution 3 mL  3 mL Nebulization TID Loney Hering D, MD   3 mL at 03/05/19 0807  . levETIRAcetam (KEPPRA) tablet 500 mg  500 mg Oral BID Loney Hering D, MD   500 mg at 03/05/19 0955  . lisinopril (ZESTRIL) tablet 20 mg  20 mg Oral Daily Salary, Montell D, MD   20 mg at 03/05/19 0954  . pantoprazole (PROTONIX) EC tablet 40 mg  40 mg Oral Daily Salary, Montell D, MD   40 mg at 03/05/19 0954  . traZODone (DESYREL) tablet 50 mg  50 mg Oral QHS Salary, Holly Bodily D, MD   50 mg at 03/04/19 2309  . vitamin C (ASCORBIC ACID) tablet 500 mg  500 mg Oral Daily Salary, Montell D, MD   500 mg at 03/05/19 9407     Discharge Medications: Please see discharge summary for a list of discharge medications.  Relevant Imaging Results:  Relevant Lab Results:   Additional Information ss [id number]  Latanya Maudlin, RN

## 2019-03-05 NOTE — Plan of Care (Signed)
SATURATION QUALIFICATIONS: (This note is used to comply with regulatory documentation for home oxygen)  Patient Saturations on Room Air at Rest = 94%  Patient Saturations on Room Air while Ambulating = 85%  Patient Saturations on 3 Liters of oxygen while Ambulating = 98%   Please briefly explain why patient needs home oxygen: Pt is very tachypniec - can't control breathing rate; she desats with exertion

## 2019-03-05 NOTE — Consult Note (Addendum)
Reason for Consult:dysarthria Referring Physician: Dr. Benjie Karvonen   CC: dysarthria   HPI: Lauren [name] is an 68 y.o. female  with hx of anxiety, DM, fibromyalgia,GERD, sleep apnea noncompliance with CPAP due to broken equipment for 6 months for obstructive sleep apnea, schizophrenia, was found by home health nurse with complaints of slurred speech, numbness of her fingers and mouth-Per patient she has had numbness in these areas since September of last year, complains of shortness of breath, dry mouth for 5 months, noted to be hypoxic with O2 saturation in the 80s.  Pt does follow up with Dr. Manuella Ghazi as out patient for suspected seizures which patient can't explain to me.  She is on Keppra 500 BID  Past Medical History:  Diagnosis Date  . Anxiety   . Apnea, sleep 02/02/2014  . Awareness of heartbeats 02/02/2014  . Breathlessness on exertion 02/02/2014  . Diabetes mellitus   . Encounter for pre-employment examination 06/29/2015  . Excessive sweating 07/05/2015  . Fibromyalgia   . GERD (gastroesophageal reflux disease)   . Gravida 1 10/26/2015   1.    . Heart murmur   . Herniated disc   . Hyperlipidemia   . Hypertension   . Itch of skin 10/26/2015  . Lack of bladder control   . Lung tumor   . Rheumatoid arthritis ([place])   . Schizoaffective disorder ([place])   . Screening for cervical cancer 07/29/2017  . 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 last seizure 2013!  [name] Sex counseling 10/26/2015  . Sleep apnea   . 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 Stiffness of both knees 06/22/2015  . Stroke [facility])     Past Surgical History:  Procedure Laterality Date  . BRAIN TUMOR EXCISION  2012   benign  . CARDIAC CATHETERIZATION  2008  . CESAREAN SECTION    . CYST REMOVAL HAND    . LUNG LOBECTOMY  1977   benign tumor  . TOTAL KNEE ARTHROPLASTY Left 01/28/2017   Procedure: TOTAL KNEE ARTHROPLASTY;  Surgeon: Corky Mull, MD;  Location: [facility] ORS;  Service:  Orthopedics;  Laterality: Left;    Family History  Problem Relation Age of Onset  . Heart disease Brother   . Depression Mother   . Heart attack Mother   . Stroke Mother   . Alcohol abuse Father   . Stroke Father   . Diabetes Sister   . Diabetes Sister   . Stomach cancer Sister   . Kidney disease Sister   . COPD Brother   . Lung cancer Brother   . Diabetes Brother     Social History:  reports that she has never smoked. She has never used smokeless tobacco. She reports that she does not drink alcohol or use drugs.  Allergies  Allergen Reactions  . Percocet [Oxycodone-Acetaminophen] Diarrhea, Nausea And Vomiting and Nausea Only  . Tramadol Hcl Diarrhea, Nausea And Vomiting and Nausea Only  . Vicodin [Hydrocodone-Acetaminophen] Diarrhea, Nausea And Vomiting and Nausea Only    Medications: I have reviewed the patient's current medications.  ROS: Unable to obtain as agitated.   Physical Examination: Blood pressure 96/71, pulse 75, temperature 97.7 F (36.5 C), resp. rate 20, height 5\' 5"  (1.651 m), weight 100.5 kg, SpO2 100 %.   Neurological Examination   Mental Status: Alert, oriented to name and place but is agitated . Stuttering speech  Cranial Nerves: II: Discs flat bilaterally; Visual fields grossly normal, pupils equal, round, reactive  to light and accommodation III,IV, VI: ptosis not present, extra-ocular motions intact bilaterally V,VII: smile symmetric, facial light touch sensation normal bilaterally VIII: hearing normal bilaterally IX,X: gag reflex present XI: bilateral shoulder shrug XII: midline tongue extension Motor: Generalized weakness bilaterally.  Tone and bulk:normal tone throughout; no atrophy noted Sensory: Pinprick and light touch intact throughout, bilaterally Deep Tendon Reflexes: 1+ and symmetric throughout Plantars: Right: downgoing   Left: downgoing Cerebellar: Not tested  Gait: not tested       Laboratory Studies:   Basic  Metabolic Panel: Recent Labs  Lab 02/26/19 1440 03/04/19 1344  NA 142 139  K 3.3* 3.6  CL 107 106  CO2 17* 19*  GLUCOSE 101* 104*  BUN 14 27*  CREATININE 1.16* 1.24*  CALCIUM 9.7 9.1  MG  --  1.3*    Liver Function Tests: Recent Labs  Lab 03/04/19 1344  AST 23  ALT 16  ALKPHOS 61  BILITOT 1.6*  PROT 7.2  ALBUMIN 4.0   No results for input(s): LIPASE, AMYLASE in the last 168 hours. No results for input(s): AMMONIA in the last 168 hours.  CBC: Recent Labs  Lab 03/04/19 1344  WBC 5.3  NEUTROABS 2.3  HGB 11.4*  HCT 32.6*  MCV 91.8  PLT 244    Cardiac Enzymes: No results for input(s): CKTOTAL, CKMB, CKMBINDEX, TROPONINI in the last 168 hours.  BNP: Invalid input(s): POCBNP  CBG: Recent Labs  Lab 03/04/19 2044 03/05/19 0740  GLUCAP 90 91    Microbiology: Results for orders placed or performed during the hospital encounter of 03/04/19  SARS Coronavirus 2 (CEPHEID - Performed in [facility] hospital lab), Hosp Order     Status: None   Collection Time: 03/04/19  4:48 PM   Specimen: Nasopharyngeal Swab  Result Value Ref Range Status   SARS Coronavirus 2 NEGATIVE NEGATIVE Final    Comment: (NOTE) If result is NEGATIVE SARS-CoV-2 target nucleic acids are NOT DETECTED. The SARS-CoV-2 RNA is generally detectable in upper and lower  respiratory specimens during the acute phase of infection. The lowest  concentration of SARS-CoV-2 viral copies this assay can detect is 250  copies / mL. A negative result does not preclude SARS-CoV-2 infection  and should not be used as the sole basis for treatment or other  patient management decisions.  A negative result may occur with  improper specimen collection / handling, submission of specimen other  than nasopharyngeal swab, presence of viral mutation(s) within the  areas targeted by this assay, and inadequate number of viral copies  (<250 copies / mL). A negative result must be combined with clinical  observations,  patient history, and epidemiological information. If result is POSITIVE SARS-CoV-2 target nucleic acids are DETECTED. The SARS-CoV-2 RNA is generally detectable in upper and lower  respiratory specimens dur ing the acute phase of infection.  Positive  results are indicative of active infection with SARS-CoV-2.  Clinical  correlation with patient history and other diagnostic information is  necessary to determine patient infection status.  Positive results do  not rule out bacterial infection or co-infection with other viruses. If result is PRESUMPTIVE POSTIVE SARS-CoV-2 nucleic acids MAY BE PRESENT.   A presumptive positive result was obtained on the submitted specimen  and confirmed on repeat testing.  While 2019 novel coronavirus  (SARS-CoV-2) nucleic acids may be present in the submitted sample  additional confirmatory testing may be necessary for epidemiological  and / or clinical management purposes  to differentiate between  SARS-CoV-2  and other Sarbecovirus currently known to infect humans.  If clinically indicated additional testing with an alternate test  methodology [phone]) is advised. The SARS-CoV-2 RNA is generally  detectable in upper and lower respiratory sp ecimens during the acute  phase of infection. The expected result is Negative. Fact Sheet for Patients:  StrictlyIdeas.no Fact Sheet for Healthcare Providers: BankingDealers.co.za This test is not yet approved or cleared by the [place] FDA and has been authorized for detection and/or diagnosis of SARS-CoV-2 by FDA under an Emergency Use Authorization (EUA).  This EUA will remain in effect (meaning this test can be used) for the duration of the COVID-19 declaration under Section 564(b)(1) of the Act, 21 U.S.C. section 360bbb-3(b)(1), unless the authorization is terminated or revoked sooner. Performed at [facility], [address]  [place], [place] [address]     Coagulation Studies: No results for input(s): LABPROT, INR in the last 72 hours.  Urinalysis:  Recent Labs  Lab 03/04/19 1344  COLORURINE YELLOW*  LABSPEC 1.028  PHURINE 6.0  GLUCOSEU NEGATIVE  HGBUR NEGATIVE  BILIRUBINUR NEGATIVE  KETONESUR 5*  PROTEINUR NEGATIVE  NITRITE NEGATIVE  LEUKOCYTESUR NEGATIVE    Lipid Panel:     Component Value Date/Time   CHOL 114 03/05/2019 0306   CHOL 203 (H) 06/27/2016 0902   TRIG 105 03/05/2019 0306   HDL 30 (L) 03/05/2019 0306   HDL 49 06/27/2016 0902   CHOLHDL 3.8 03/05/2019 0306   VLDL 21 03/05/2019 0306   LDLCALC 63 03/05/2019 0306   LDLCALC 138 (H) 06/27/2016 0902    HgbA1C:  Lab Results  Component Value Date   HGBA1C 6.4 (H) 11/23/2018    Urine Drug Screen:      Component Value Date/Time   LABOPIA NONE DETECTED 12/28/2017 2144   COCAINSCRNUR NONE DETECTED 12/28/2017 2144   LABBENZ NONE DETECTED 12/28/2017 2144   AMPHETMU NONE DETECTED 12/28/2017 2144   THCU NONE DETECTED 12/28/2017 2144   LABBARB NONE DETECTED 12/28/2017 2144    Alcohol Level: No results for input(s): ETH in the last 168 hours.   Imaging: Ct Angio Chest Pe W And/or Wo Contrast  Result Date: 03/04/2019 CLINICAL DATA:  Labored breathing. EXAM: CT ANGIOGRAPHY CHEST WITH CONTRAST TECHNIQUE: Multidetector CT imaging of the chest was performed using the standard protocol during bolus administration of intravenous contrast. Multiplanar CT image reconstructions and MIPs were obtained to evaluate the vascular anatomy. CONTRAST:  51mL OMNIPAQUE IOHEXOL 350 MG/ML SOLN COMPARISON:  02/15/2019 FINDINGS: Cardiovascular: Cardiopericardial silhouette is at upper limits of normal for size. Tiny pericardial effusion noted. Coronary artery calcification is evident. Atherosclerotic calcification is noted in the wall of the thoracic aorta. No filling defect within the opacified pulmonary arteries to suggest the presence of an acute pulmonary  embolus. Mediastinum/Nodes: 10 mm short axis subcarinal lymph node is upper normal and similar to prior. There is no hilar lymphadenopathy. There is no axillary lymphadenopathy. The esophagus has normal imaging features. Lungs/Pleura: Surgical clips noted in the left hilum. 3 mm subpleural nodule noted anterior right upper lobe, stable since prior, but new since 07/11/2017. stable scarring medial right lower lobe since 2018. Upper Abdomen: The liver shows diffusely decreased attenuation suggesting fat deposition. Musculoskeletal: No worrisome lytic or sclerotic osseous abnormality. Review of the MIP images confirms the above findings. IMPRESSION: 1. No CT evidence for acute pulmonary embolus. 2. 3 mm anterior right upper lobe subpleural nodule. No follow-up needed if patient is low-risk. Non-contrast chest CT can be considered in 12 months  if patient is high-risk. This recommendation follows the consensus statement: Guidelines for Management of Incidental Pulmonary Nodules Detected on CT Images: From the Fleischner Society 2017; Radiology 2017; 284:228-243. Electronically Signed   By: Misty Stanley M.D.   On: 03/04/2019 18:41   US Carotid Bilateral (at [facility] Only)  Result Date: 03/05/2019 CLINICAL DATA:  CVA. History of hypertension, hyperlipidemia and diabetes. EXAM: BILATERAL CAROTID DUPLEX ULTRASOUND TECHNIQUE: Pearline Cables scale imaging, color Doppler and duplex ultrasound were performed of bilateral carotid and vertebral arteries in the neck. COMPARISON:  11/22/2018 FINDINGS: Criteria: Quantification of carotid stenosis is based on velocity parameters that correlate the residual internal carotid diameter with NASCET-based stenosis levels, using the diameter of the distal internal carotid lumen as the denominator for stenosis measurement. The following velocity measurements were obtained: RIGHT ICA: 105/31 cm/sec CCA: 165/53 cm/sec SYSTOLIC ICA/CCA RATIO:  0.8 ECA: 201 cm/sec LEFT ICA: 126/40 cm/sec CCA:  748/27 cm/sec SYSTOLIC ICA/CCA RATIO:  0.8 ECA: 166 cm/sec RIGHT CAROTID ARTERY: There is a minimal to moderate amount of eccentric mixed echogenic plaque within the right carotid bulb (image 16), extending to involve the origin and proximal aspects of the right internal carotid artery (image 24), morphologically unchanged compared to the 11/2018 examination, though not definitely resulting in elevated peak systolic velocities within the internal carotid artery to suggest a hemodynamically significant stenosis. RIGHT VERTEBRAL ARTERY:  Antegrade Flow LEFT CAROTID ARTERY: There is a minimal to moderate amount of eccentric echogenic plaque involving the left carotid bulb, extending to involve the origin and proximal aspects of the left internal carotid artery (image 58), morphologically unchanged compared to the 11/2018 examination and again results in elevated peak systolic velocities within the left internal carotid artery. Greatest acquired peak systolic velocity within the distal aspect of the left ICA measures 143 cm/sec (image 67) previously, greatest acquired peak systolic velocity within the mid left ICA measured 127 cm/sec. LEFT VERTEBRAL ARTERY:  Antegrade flow IMPRESSION: 1. Minimal to moderate amount of left-sided atherosclerotic plaque again results in elevated peak systolic velocities within left internal carotid artery compatible with the 50-69% luminal narrowing range. Further evaluation with CTA could performed as clinically indicated. 2. Minimal to moderate amount of right-sided atherosclerotic plaque, morphologically similar to the 11/2018 examination though not definitely resulting in a hemodynamically significant stenosis. Electronically Signed   By: Sandi Mariscal M.D.   On: 03/05/2019 10:05   Dg Chest Port 1 View  Result Date: 03/04/2019 CLINICAL DATA:  Shortness of breath EXAM: PORTABLE CHEST 1 VIEW COMPARISON:  02/16/2019, CT 02/15/2019 FINDINGS: No acute airspace disease or effusion.  Postsurgical changes in the left hilus. Borderline cardiomegaly. No pneumothorax. IMPRESSION: No active disease.  Postsurgical changes on the left Electronically Signed   By: Donavan Foil M.D.   On: 03/04/2019 17:25     Assessment/Plan:   68 y.o. female  with hx of anxiety, DM, fibromyalgia,GERD, sleep apnea noncompliance with CPAP due to broken equipment for 6 months for obstructive sleep apnea, schizophrenia, was found by home health nurse with complaints of slurred speech, numbness of her fingers and mouth-Per patient she has had numbness in these areas since September of last year, complains of shortness of breath, dry mouth for 5 months, noted to be hypoxic with O2 saturation in the 80s.  Pt does follow up with Dr. Manuella Ghazi as out patient for suspected seizures which patient can't explain to me.  She is on Keppra 500 BID  - Unclear what real speech changes are.  It  stuttering at time and others clear dysarthria.  States been going on for a while.  She does have generalized weakness and tremor.   - Agree with MRI brain now to look for ischemia - This doesn't change through out the day so don't think this is Myesthenia.  No clear facial weakness to point to peripheral demyelination and presence of reflexes - Might need EMG as out patient or rapid nerve stim as out patient  Addendum: MRI no acute abnormalities No further imaging from neuro stand point Follow up Dr. Manuella Ghazi as out pt  03/05/2019, 10:50 AM

## 2019-03-05 NOTE — Progress Notes (Signed)
OT Cancellation Note  Patient Details Name: Lauren [name] MRN: [id number] DOB: 22-Aug-1951   Cancelled Treatment:    Reason Eval/Treat Not Completed: Other (comment). On 3rd attempt, pt working with PT. Will re-attempt at later date/time as pt is available and medically appropriate.   Jeni Salles, MPH, MS, OTR/L ascom [phone] 03/05/19, 1:40 PM

## 2019-03-05 NOTE — Progress Notes (Signed)
PT Cancellation Note  Patient Details Name: Lauren [name] MRN: [id number] DOB: 07/21/1951   Cancelled Treatment:    Reason Eval/Treat Not Completed: Other (comment)(Pt unavailable at this time, transport entered room to take patient for MRI. PT will follow up as able.)   Lieutenant Diego PT, DPT 10:46 AM,03/05/19 [phone]
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
## 2019-03-05 NOTE — TOC Transition Note (Addendum)
Transition of Care [facility]) - CM/SW Discharge Note   Patient Details  Name: Lauren [name] MRN: [id number] Date of Birth: 1951-01-11  Transition of Care [facility]) CM/SW Contact:  Latanya Maudlin, RN Phone Number: 03/05/2019, 1:27 PM   Clinical Narrative:   Patient to be discharged per MD order. Orders in place for home health services. [facility] and wishes to resume. Patient uses a cane and rolling walker at home. No new DME needs. Received call from Santiago Glad with the ACT team who shared her concerns for discharge. Patient has completed medical work up and has been medically cleared by the MD. PT worked with patient upon request and they have recommended home health therefore we could not get patient into a SNF. [name] and [facility] social worker is attempting to get patient into an assisted living facility or group home once the patients Medicaid is completed.   **Update 6/26 15:57- Pt now recommending SNF. Patient agreeable, preference is [facility]. FL2 submitted. Bed offers pending. Multiple SW are involved with placement,etc. Short term rehab would be beneficial to assist with placement to a long term care facility. Per outpatient SW they have sent referral to family care home and owner, [place] is requesting FL2. Will fax signed FL2 once completed.     Final next level of care: [place] Barriers to Discharge: No Barriers Identified   Patient Goals and CMS Choice Patient states their goals for this hospitalization and ongoing recovery are:: to go home CMS Medicare.gov Compare Post Acute Care list provided to:: Patient Choice offered to / list presented to : Patient  Discharge Placement                       Discharge Plan and Services In-house Referral: Clinical Social Work Discharge Planning Services: CM Consult Post Acute Care Choice: Resumption of Product/process development scientist, Home Health                    HH Arranged: RN, PT, OT, Nurse's  Aide, Social Work [employer] Agency: [place] ([place]) Date [place]: 03/05/19 Time [place]: [place] Representative spoke with at [place]: [place] ([place]) Interventions     Readmission Risk Interventions Readmission Risk Prevention Plan 02/16/2019  Transportation Screening Complete  PCP or Specialist Appt within 5-7 Days Complete  Home Care Screening Complete  Medication Review (RN CM) Complete  Some recent data might be hidden
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
## 2019-03-05 NOTE — Evaluation (Signed)
Clinical/Bedside Swallow Evaluation Patient Details  Name: Lauren [name] MRN: [id number] Date of Birth: [birth year]  Today's Date: 03/05/2019 Time: SLP Start Time (ACUTE ONLY): 59 SLP Stop Time (ACUTE ONLY): 1015 SLP Time Calculation (min) (ACUTE ONLY): 55 min  Past Medical History:  Past Medical History:  Diagnosis Date  . Anxiety   . Apnea, sleep 02/02/2014  . Awareness of heartbeats 02/02/2014  . Breathlessness on exertion 02/02/2014  . Diabetes mellitus   . Encounter for pre-employment examination 06/29/2015  . Excessive sweating 07/05/2015  . Fibromyalgia   . GERD (gastroesophageal reflux disease)   . Gravida 1 10/26/2015   1.    . Heart murmur   . Herniated disc   . Hyperlipidemia   . Hypertension   . Itch of skin 10/26/2015  . Lack of bladder control   . Lung tumor   . Rheumatoid arthritis ([place])   . Schizoaffective disorder ([place])   . Screening for cervical cancer 07/29/2017  . Seizure [facility])    after brain surgery 2012. last seizure 2013!  [name] Sex counseling 10/26/2015  . Sleep apnea   . 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 Stiffness of both knees 06/22/2015  . Stroke [facility])    Past Surgical History:  Past Surgical History:  Procedure Laterality Date  . BRAIN TUMOR EXCISION  2012   benign  . CARDIAC CATHETERIZATION  2008  . CESAREAN SECTION    . CYST REMOVAL HAND    . LUNG LOBECTOMY  1977   benign tumor  . TOTAL KNEE ARTHROPLASTY Left 01/28/2017   Procedure: TOTAL KNEE ARTHROPLASTY;  Surgeon: Corky Mull, MD;  Location: [facility] ORS;  Service: Orthopedics;  Laterality: Left;   HPI:  Pt is a 68 y/o female admitted to the ED. She is a 68 y.o. female with a history of multiple medical issues including Seizure, brain and lung tumors, stroke, anxiety, diabetes, fibromyalgia, hyperlipidemia, hypertension, schizoaffective disorder who presents to the ED for slurred speech.  She uses a CPAP for OSA but unable to do so due to broken  equipment for 6 months. She reports that her slurred speech has increased in the past 4 months.  She presented tachypneic here, complains of numbness in her fingers in her mouth.  She reports her mouth has been dry for 4 to 5 months.  She had low potassium and low magnesium 6 weeks ago.  Noted rapid, shallow breathing but did not appear or report any SOB; O2 sats were WNL per NSG. Open mouth posture and mouth breathing noted at rest.  Of note, pt had a planned consult w/ Neurology at [facility] but was unable to go to the appt.   Assessment / Plan / Recommendation Clinical Impression  Pt appears to present w/ Mild oral phase dysphagia impacted by reduced coordinated lingual movements; noted Dysarthria(of speech) w/ a more back lingual position during speech/rest. Pt stated her speech/articulation has decreased over ~4 months now. She endorses increased effort w/ some solid foods during mastication. Pt is verbally conversive and follows commands. Noted rapid, shallow RR though pt denied being SOB - she stated same decline in her breathing as w/ her speech. Open mouth posture and mouth breathing at rest. Pt consumed trials of thin liquids, purees, and soft soft foods w/ no overt s/s of aspiration noted; no decline in vocal quality or change in respiratory pattern during/post trials. Oral phase c/b min slower bolus management and manipulation of solids but given time, she cleared  orally. Oral cavity appeared dry - mouth breathing frequently noted. Adequate bolus management and oral clearing noted w/ trials of puree and liquids. OM exam revealed a tongue back posture during movements/at rest. Reduced lingual coordination during lateral movements; strength WFL. Pt able to feed self w/ support d/t shaky UEs. Pt swallowed all pills w/ NSG using water; one at a time for precaution. Recommend a Mech Soft diet w/ thin liquids; general aspiration precautions; support at meals. Pills in Puree if needed for easier swallowing.  Recommend pt f/u w/ Neurology for full assessment of decline in Motor functions of speech, respirations, and tremorous UEs for appropriate dx. Pt may f/u for Dysarthria assessment w/ skilled ST services once assessment completed by Neurology (for clear etiology). NSG updated.  SLP Visit Diagnosis: Dysphagia, oral phase (R13.11)    Aspiration Risk  Mild aspiration risk(reduced following precautions)    Diet Recommendation  Mech Soft diet w/ thin liquids; general aspiration precautions; support at meals for assistance w/ feeding.   Medication Administration: Whole meds with puree    Other  Recommendations Recommended Consults: (Neurology evaluation) Oral Care Recommendations: Oral care BID;Oral care before and after PO;Staff/trained caregiver to provide oral care Other Recommendations: (n/a)   Follow up Recommendations None(TBD)      Frequency and Duration min 1 x/week  1 week       Prognosis Prognosis for Safe Diet Advancement: Fair(-Good) Barriers to Reach Goals: Time post onset;Severity of deficits      Swallow Study   General Date of Onset: 03/04/19 HPI: (Pt is a 68 y/o female admitted to the ED. ) Type of Study: Bedside Swallow Evaluation Previous Swallow Assessment: (none reported) Diet Prior to this Study: NPO Temperature Spikes Noted: No(wbc 5.3) Respiratory Status: Nasal cannula(2 liters) History of Recent Intubation: No Behavior/Cognition: Alert;Cooperative;Pleasant mood;Distractible;Requires cueing Oral Cavity Assessment: Dry;Dried secretions Oral Care Completed by SLP: Yes Oral Cavity - Dentition: Adequate natural dentition;Missing dentition Vision: Functional for self-feeding Self-Feeding Abilities: Able to feed self;Needs assist;Needs set up(shaky) Patient Positioning: Upright in bed(needed positioning) Baseline Vocal Quality: Normal(shaky quality; Dysarthria) Volitional Cough: Strong Volitional Swallow: Able to elicit    Oral/Motor/Sensory Function Overall  Oral Motor/Sensory Function: Mild impairment Facial ROM: Within Functional Limits Facial Symmetry: Within Functional Limits Lingual ROM: (reduced; tongue back position) Lingual Symmetry: Within Functional Limits Lingual Strength: Within Functional Limits Velum: Within Functional Limits Mandible: Within Functional Limits   Ice Chips Ice chips: Within functional limits Presentation: Spoon(2 trials)   Thin Liquid Thin Liquid: Within functional limits Presentation: Cup;Self Fed;Straw(assisted; ~6+ ozs total)    Nectar Thick Nectar Thick Liquid: Not tested   Honey Thick Honey Thick Liquid: Not tested   Puree Puree: Within functional limits(grossly) Presentation: Spoon(fed; ~3 ozs)   Solid     Solid: Impaired Presentation: Spoon(fed; 5 trials) Oral Phase Impairments: Reduced lingual movement/coordination(impacted mastication) Oral Phase Functional Implications: Prolonged oral transit Pharyngeal Phase Impairments: (None)       Orinda Kenner, MS, CCC-SLP Shawnmichael Parenteau 03/05/2019,2:44 PM

## 2019-03-05 NOTE — Chronic Care Management (AMB) (Signed)
  Chronic Care Management   Note  03/05/2019 Name: Lauren [name] MRN: [id number] DOB: [birth year]  Care coordination: Successful telephone encounter with Ms. Lauren Lo, RN with Lauren [name]. RN CM notified Ms Lauren [name] that [facility] for patient placement has been completed and faxed to [phone]. [name] confirms [facility] fax receipt. RN CM informed Lauren [name] that patient did not discharge home from [facility] as previously documented, but transferred to observations for overnight monitoring of respiratory and neurological status.  [name] later gave a curtesy call to RN CM stating patient would be transferring to SNF post discharge for PT and would then discharge to group home through collaboration with Lauren [name] and [facility] [employer]. RN CM thanked Lauren [name] for update and notified PCP. Goals Addressed            This Visit's Progress   . COMPLETED: I need to see a neurologist but I don't always have transportation (pt-stated)       Transportation will now be provided by SNF/Group home.   Current Barriers:  [name] Knowledge Deficits related to understanding [facility] Medicare transportation benefits     Nurse Case Manager Clinical Goal(s):  [name] Over the next 360 days, patient will utilize [facility] Medicare transportation when transportation is a barrier to obtaining scheduled medical care  Interventions:  . RN CM discussed [employer] Medicare Transportation benefits with patient and sister Lauren [name] per patient request . Provided sister Lauren [name] with number and process for scheduling transportation as patient often forgets  Patient Self Care Activities:  . Self administers medications as prescribed . Attends all scheduled provider appointments . Calls pharmacy for medication refills . Attends church or other social activities . Performs ADL's independently . Calls provider office for new concerns or questions  Initial goal documentation       Plan: RN CM remains available to assist  with care coordination as needed and will follow up with patient post discharge once permanent residence is established.  Lauren [name] E. Rollene Rotunda, RN, BSN Nurse Care Coordinator [facility] Care Management  [phone]

## 2019-03-05 NOTE — TOC Initial Note (Signed)
Transition of Care [facility]) - Initial/Assessment Note    Patient Details  Name: Lauren [name] MRN: [id number] Date of Birth: Sep 14, 1950  [facility] CM/SW Contact:    Latanya Maudlin, RN Phone Number: 03/05/2019, 9:15 AM  Clinical Narrative:  Patient admitted to [facility] under observation status for stroke workup. RNCM consulted on patient to provide MOON letter and complete assessment. Patient is from home and has supports from her sisters. [facility] for [facility] health for PT,OT,aide and SW. Corene Cornea aware of admission. Patient also being followed by [name] and [facility] SW team to assist with group home or assisted living placement. These services are assisting Lauren [name] to apply for medicaid so that she can get access to the before mentioned facilities. Patient has RW and bedside commode at home as well as PRN oxygen. Family provides transport. PT/OT pending and we will assist with appropriate disposition needs once it is better determined on what is needed.                Expected Discharge Plan: [place] Barriers to Discharge: Continued Medical Work up   Patient Goals and CMS Choice Patient states their goals for this hospitalization and ongoing recovery are:: just to feel better CMS Medicare.gov Compare Post Acute Care list provided to:: Patient Choice offered to / list presented to : Patient  Expected Discharge Plan and Services Expected Discharge Plan: [place] In-house Referral: Clinical Social Work Discharge Planning Services: CM Consult Post Acute Care Choice: Resumption of Svcs/PTA Provider, Home Health Living arrangements for the past 2 months: [place]: [place] ([facility]) Date HH Agency Contacted: 03/05/19 Time [name]: [phone] Representative spoke with at [place]: [place]  Arrangements/Services Living arrangements for the past 2 months: [place] with:: Self, Siblings Patient language and need for interpreter reviewed:: Yes Do you feel safe going back to the place where you live?: Yes      Need for Family Participation in Patient Care: No (Comment)   Current home services: DME, Home OT, Home PT, Home RN, Homehealth aide Criminal Activity/Legal Involvement Pertinent to Current Situation/Hospitalization: No - Comment as needed  Activities of Daily Living Home Assistive Devices/Equipment: Cane (specify quad or straight) ADL Screening (condition at time of admission) Patient's cognitive ability adequate to safely complete daily activities?: Yes Is the patient deaf or have difficulty hearing?: No Does the patient have difficulty seeing, even when wearing glasses/contacts?: No Does the patient have difficulty concentrating, remembering, or making decisions?: Yes Patient able to express need for assistance with ADLs?: Yes Does the patient have difficulty dressing or bathing?: No Independently performs ADLs?: Yes (appropriate for developmental age) Does the patient have difficulty walking or climbing stairs?: Yes Weakness of Legs: None Weakness of Arms/Hands: None  Permission Sought/Granted Permission sought to share information with : Case Manager                Emotional Assessment Appearance:: Appears stated age Attitude/Demeanor/Rapport: Gracious Affect (typically observed): Quiet Orientation: : Oriented to Self, Oriented to Place, Oriented to  Time      Admission diagnosis:  Dry mouth [R68.2] Hypomagnesemia [E83.42] SOB (shortness of breath) [R06.02] Hypoxia [R09.02] Patient Active Problem List   Diagnosis Date  Noted  . Slurred speech 03/04/2019  . Chest pain 02/16/2019  . Chest pain in adult 02/15/2019  . Acute respiratory failure ([place]) 02/15/2019  . Seizures ([place]) 11/24/2018  . Paresthesias 11/22/2018  . Dry mouth 10/12/2018  .  Hx of resection of meningioma 07/21/2018  . Tremor 07/21/2018  . Schizoaffective disorder ([place]) 12/29/2017  . Morbid obesity ([place]) 12/09/2017  . Meningioma ([place]) 11/25/2017  . Atherosclerosis of aorta ([place]) 11/25/2017  . Calcification of coronary artery 11/25/2017  . Schizophrenia ([place]) 11/04/2017  . Acid reflux 06/29/2015  . Diabetes mellitus type 2, controlled, without complications ([place]) 33/54/5625  . Cardiac murmur 06/29/2015  . Arthritis of knee, degenerative 06/28/2015  . Insomnia w/ sleep apnea 03/21/2015  . Anxiety disorder 03/21/2015  . Hypertension 03/21/2015  . HLD (hyperlipidemia) 03/21/2015  . Urge incontinence 03/21/2015   PCP:  Steele Sizer, MD Pharmacy:   [place], [place] - [address] [place] [address] Phone: [phone] Fax: [phone]     Social Determinants of Health ([place]) Interventions    Readmission Risk Interventions Readmission Risk Prevention Plan 02/16/2019  Transportation Screening Complete  PCP or Specialist Appt within 5-7 Days Complete  Home Care Screening Complete  Medication Review (RN CM) Complete  Some recent data might be hidden

## 2019-03-05 NOTE — Care Management Obs Status (Signed)
[place] NOTIFICATION   Patient Details  Name: Lauren [name] MRN: [id number] Date of Birth: November 03, 1950   Medicare Observation Status Notification Given:  Yes    Celestina Gironda A Harshita Bernales, RN 03/05/2019, 9:21 AM

## 2019-03-05 NOTE — Telephone Encounter (Signed)
Patient is currently in ED and will be notified by [name] and Truddie Crumble our Chronic care management team regarding her Potassium.

## 2019-03-06 DIAGNOSIS — R4781 Slurred speech: Secondary | ICD-10-CM | POA: Diagnosis not present

## 2019-03-06 DIAGNOSIS — E119 Type 2 diabetes mellitus without complications: Secondary | ICD-10-CM | POA: Diagnosis not present

## 2019-03-06 DIAGNOSIS — J9601 Acute respiratory failure with hypoxia: Secondary | ICD-10-CM | POA: Diagnosis not present

## 2019-03-06 DIAGNOSIS — G4733 Obstructive sleep apnea (adult) (pediatric): Secondary | ICD-10-CM | POA: Diagnosis not present

## 2019-03-06 LAB — GLUCOSE, CAPILLARY
Glucose-Capillary: 110 mg/dL — ABNORMAL HIGH (ref 70–99)
Glucose-Capillary: 117 mg/dL — ABNORMAL HIGH (ref 70–99)
Glucose-Capillary: 133 mg/dL — ABNORMAL HIGH (ref 70–99)
Glucose-Capillary: 114 mg/dL — ABNORMAL HIGH (ref 70–99)

## 2019-03-06 LAB — HEMOGLOBIN A1C
Hgb A1c MFr Bld: 6.2 % — ABNORMAL HIGH (ref 4.8–5.6)
Mean Plasma Glucose: 131 mg/dL

## 2019-03-06 NOTE — Consult Note (Signed)
Subjective: still stuttering speech   Past Medical History:  Diagnosis Date  . Anxiety   . Apnea, sleep 02/02/2014  . Awareness of heartbeats 02/02/2014  . Breathlessness on exertion 02/02/2014  . Diabetes mellitus   . Encounter for pre-employment examination 06/29/2015  . Excessive sweating 07/05/2015  . Fibromyalgia   . GERD (gastroesophageal reflux disease)   . Gravida 1 10/26/2015   1.    . Heart murmur   . Herniated disc   . Hyperlipidemia   . Hypertension   . Itch of skin 10/26/2015  . Lack of bladder control   . Lung tumor   . Rheumatoid arthritis ([place])   . Schizoaffective disorder ([place])   . Screening for cervical cancer 07/29/2017  . Seizure [facility])    after brain surgery 2012. last seizure 2013!  [name] Sex counseling 10/26/2015  . Sleep apnea   . 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 Stiffness of both knees 06/22/2015  . Stroke [facility])     Past Surgical History:  Procedure Laterality Date  . BRAIN TUMOR EXCISION  2012   benign  . CARDIAC CATHETERIZATION  2008  . CESAREAN SECTION    . CYST REMOVAL HAND    . LUNG LOBECTOMY  1977   benign tumor  . TOTAL KNEE ARTHROPLASTY Left 01/28/2017   Procedure: TOTAL KNEE ARTHROPLASTY;  Surgeon: Corky Mull, MD;  Location: [facility] ORS;  Service: Orthopedics;  Laterality: Left;    Family History  Problem Relation Age of Onset  . Heart disease Brother   . Depression Mother   . Heart attack Mother   . Stroke Mother   . Alcohol abuse Father   . Stroke Father   . Diabetes Sister   . Diabetes Sister   . Stomach cancer Sister   . Kidney disease Sister   . COPD Brother   . Lung cancer Brother   . Diabetes Brother     Social History:  reports that she has never smoked. She has never used smokeless tobacco. She reports that she does not drink alcohol or use drugs.  Allergies  Allergen Reactions  . Percocet [Oxycodone-Acetaminophen] Diarrhea, Nausea And Vomiting and Nausea Only  . Tramadol Hcl  Diarrhea, Nausea And Vomiting and Nausea Only  . Vicodin [Hydrocodone-Acetaminophen] Diarrhea, Nausea And Vomiting and Nausea Only    Medications: I have reviewed the patient's current medications.  ROS: Unable to obtain as agitated.   Physical Examination: Blood pressure 99/69, pulse 64, temperature (!) 97.5 F (36.4 C), resp. rate 18, height 5\' 5"  (1.651 m), weight 100.5 kg, SpO2 99 %.   Neurological Examination   Mental Status: Alert, oriented to name and place but is agitated . Stuttering speech  Cranial Nerves: II: Discs flat bilaterally; Visual fields grossly normal, pupils equal, round, reactive to light and accommodation III,IV, VI: ptosis not present, extra-ocular motions intact bilaterally V,VII: smile symmetric, facial light touch sensation normal bilaterally VIII: hearing normal bilaterally IX,X: gag reflex present XI: bilateral shoulder shrug XII: midline tongue extension Motor: Generalized weakness bilaterally.  Tone and bulk:normal tone throughout; no atrophy noted Sensory: Pinprick and light touch intact throughout, bilaterally Deep Tendon Reflexes: 1+ and symmetric throughout Plantars: Right: downgoing   Left: downgoing Cerebellar: Not tested  Gait: not tested       Laboratory Studies:   Basic Metabolic Panel: Recent Labs  Lab 03/04/19 1344  NA 139  K 3.6  CL 106  CO2 19*  GLUCOSE 104*  BUN 27*  CREATININE 1.24*  CALCIUM 9.1  MG 1.3*    Liver Function Tests: Recent Labs  Lab 03/04/19 1344  AST 23  ALT 16  ALKPHOS 61  BILITOT 1.6*  PROT 7.2  ALBUMIN 4.0   No results for input(s): LIPASE, AMYLASE in the last 168 hours. No results for input(s): AMMONIA in the last 168 hours.  CBC: Recent Labs  Lab 03/04/19 1344  WBC 5.3  NEUTROABS 2.3  HGB 11.4*  HCT 32.6*  MCV 91.8  PLT 244    Cardiac Enzymes: No results for input(s): CKTOTAL, CKMB, CKMBINDEX, TROPONINI in the last 168 hours.  BNP: Invalid input(s):  POCBNP  CBG: Recent Labs  Lab 03/05/19 1238 03/05/19 1638 03/05/19 2112 03/06/19 0744 03/06/19 1145  GLUCAP 119* 94 101* 110* 117*    Microbiology: Results for orders placed or performed during the hospital encounter of 03/04/19  SARS Coronavirus 2 (CEPHEID - Performed in [place] hospital lab), Hosp Order     Status: None   Collection Time: 03/04/19  4:48 PM   Specimen: Nasopharyngeal Swab  Result Value Ref Range Status   SARS Coronavirus 2 NEGATIVE NEGATIVE Final    Comment: (NOTE) If result is NEGATIVE SARS-CoV-2 target nucleic acids are NOT DETECTED. The SARS-CoV-2 RNA is generally detectable in upper and lower  respiratory specimens during the acute phase of infection. The lowest  concentration of SARS-CoV-2 viral copies this assay can detect is 250  copies / mL. A negative result does not preclude SARS-CoV-2 infection  and should not be used as the sole basis for treatment or other  patient management decisions.  A negative result may occur with  improper specimen collection / handling, submission of specimen other  than nasopharyngeal swab, presence of viral mutation(s) within the  areas targeted by this assay, and inadequate number of viral copies  (<250 copies / mL). A negative result must be combined with clinical  observations, patient history, and epidemiological information. If result is POSITIVE SARS-CoV-2 target nucleic acids are DETECTED. The SARS-CoV-2 RNA is generally detectable in upper and lower  respiratory specimens dur ing the acute phase of infection.  Positive  results are indicative of active infection with SARS-CoV-2.  Clinical  correlation with patient history and other diagnostic information is  necessary to determine patient infection status.  Positive results do  not rule out bacterial infection or co-infection with other viruses. If result is PRESUMPTIVE POSTIVE SARS-CoV-2 nucleic acids MAY BE PRESENT.   A presumptive positive result was  obtained on the submitted specimen  and confirmed on repeat testing.  While 2019 novel coronavirus  (SARS-CoV-2) nucleic acids may be present in the submitted sample  additional confirmatory testing may be necessary for epidemiological  and / or clinical management purposes  to differentiate between  SARS-CoV-2 and other Sarbecovirus currently known to infect humans.  If clinically indicated additional testing with an alternate test  methodology [phone]) is advised. The SARS-CoV-2 RNA is generally  detectable in upper and lower respiratory sp ecimens during the acute  phase of infection. The expected result is Negative. Fact Sheet for Patients:  StrictlyIdeas.no Fact Sheet for Healthcare Providers: BankingDealers.co.za This test is not yet approved or cleared by the [place] FDA and has been authorized for detection and/or diagnosis of SARS-CoV-2 by FDA under an Emergency Use Authorization (EUA).  This EUA will remain in effect (meaning this test can be used) for the duration of the COVID-19 declaration under Section 564(b)(1) of the Act, 21 U.S.C. section 360bbb-3(b)(1),  unless the authorization is terminated or revoked sooner. Performed at [facility], [address] [place], [place] [address]     Coagulation Studies: No results for input(s): LABPROT, INR in the last 72 hours.  Urinalysis:  Recent Labs  Lab 03/04/19 1344  COLORURINE YELLOW*  LABSPEC 1.028  PHURINE 6.0  GLUCOSEU NEGATIVE  HGBUR NEGATIVE  BILIRUBINUR NEGATIVE  KETONESUR 5*  PROTEINUR NEGATIVE  NITRITE NEGATIVE  LEUKOCYTESUR NEGATIVE    Lipid Panel:     Component Value Date/Time   CHOL 114 03/05/2019 0306   CHOL 203 (H) 06/27/2016 0902   TRIG 105 03/05/2019 0306   HDL 30 (L) 03/05/2019 0306   HDL 49 06/27/2016 0902   CHOLHDL 3.8 03/05/2019 0306   VLDL 21 03/05/2019 0306   LDLCALC 63 03/05/2019 0306   LDLCALC 138 (H) 06/27/2016 0902     HgbA1C:  Lab Results  Component Value Date   HGBA1C 6.2 (H) 03/05/2019    Urine Drug Screen:      Component Value Date/Time   LABOPIA NONE DETECTED 12/28/2017 2144   COCAINSCRNUR NONE DETECTED 12/28/2017 2144   LABBENZ NONE DETECTED 12/28/2017 2144   AMPHETMU NONE DETECTED 12/28/2017 2144   THCU NONE DETECTED 12/28/2017 2144   LABBARB NONE DETECTED 12/28/2017 2144    Alcohol Level: No results for input(s): ETH in the last 168 hours.   Imaging: Ct Angio Chest Pe W And/or Wo Contrast  Result Date: 03/04/2019 CLINICAL DATA:  Labored breathing. EXAM: CT ANGIOGRAPHY CHEST WITH CONTRAST TECHNIQUE: Multidetector CT imaging of the chest was performed using the standard protocol during bolus administration of intravenous contrast. Multiplanar CT image reconstructions and MIPs were obtained to evaluate the vascular anatomy. CONTRAST:  81mL OMNIPAQUE IOHEXOL 350 MG/ML SOLN COMPARISON:  02/15/2019 FINDINGS: Cardiovascular: Cardiopericardial silhouette is at upper limits of normal for size. Tiny pericardial effusion noted. Coronary artery calcification is evident. Atherosclerotic calcification is noted in the wall of the thoracic aorta. No filling defect within the opacified pulmonary arteries to suggest the presence of an acute pulmonary embolus. Mediastinum/Nodes: 10 mm short axis subcarinal lymph node is upper normal and similar to prior. There is no hilar lymphadenopathy. There is no axillary lymphadenopathy. The esophagus has normal imaging features. Lungs/Pleura: Surgical clips noted in the left hilum. 3 mm subpleural nodule noted anterior right upper lobe, stable since prior, but new since 07/11/2017. stable scarring medial right lower lobe since 2018. Upper Abdomen: The liver shows diffusely decreased attenuation suggesting fat deposition. Musculoskeletal: No worrisome lytic or sclerotic osseous abnormality. Review of the MIP images confirms the above findings. IMPRESSION: 1. No CT evidence  for acute pulmonary embolus. 2. 3 mm anterior right upper lobe subpleural nodule. No follow-up needed if patient is low-risk. Non-contrast chest CT can be considered in 12 months if patient is high-risk. This recommendation follows the consensus statement: Guidelines for Management of Incidental Pulmonary Nodules Detected on CT Images: From the Fleischner Society 2017; Radiology 2017; 284:228-243. Electronically Signed   By: Misty Stanley M.D.   On: 03/04/2019 18:41   Mr Brain Wo Contrast  Result Date: 03/05/2019 CLINICAL DATA:  68 year old female with ataxia, slurred speech. EXAM: MRI HEAD WITHOUT CONTRAST TECHNIQUE: Multiplanar, multiecho pulse sequences of the brain and surrounding structures were obtained without intravenous contrast. COMPARISON:  Brain MRI and intracranial MRA 11/22/2018. FINDINGS: Brain: No restricted diffusion or evidence of acute infarction. Roughly 20 millimeter millimeter left vertex parasagittal meningioma redemonstrated with mild mass effect on the left superior frontal gyrus and regional  vasogenic edema. As before, edema is fairly abundant (series 15, image 43) and tracks into the left corona radiata. Extent of edema is stable since March. No othermass effect. No midline shift, ventriculomegaly, extra-axial collection or acute intracranial hemorrhage. Cervicomedullary junction and pituitary are within normal limits. Superimposed Patchy and confluent bilateral cerebral white matter T2 and FLAIR hyperintensity is stable. No chronic blood products identified. The deep gray matter nuclei, brainstem, and cerebellum remain normal. Vascular: Major intracranial vascular flow voids are stable. Skull and upper cervical spine: Negative visible cervical spine. Stable bone marrow signal. Previous left vertex craniotomy. Sinuses/Orbits: Stable and negative. Other: Mastoids remain clear. Visible internal auditory structures appear stable and negative. Normal stylomastoid foramina. Scalp and face  soft tissues appear negative. IMPRESSION: 1.  No acute intracranial abnormality. 2. 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 3. Stable underlying advanced but nonspecific cerebral white matter signal changes, most commonly due to chronic small vessel disease. Electronically Signed   By: Genevie Ann M.D.   On: 03/05/2019 12:24   US Carotid Bilateral (at [facility] And Ap Only)  Result Date: 03/05/2019 CLINICAL DATA:  CVA. History of hypertension, hyperlipidemia and diabetes. EXAM: BILATERAL CAROTID DUPLEX ULTRASOUND TECHNIQUE: Pearline Cables scale imaging, color Doppler and duplex ultrasound were performed of bilateral carotid and vertebral arteries in the neck. COMPARISON:  11/22/2018 FINDINGS: Criteria: Quantification of carotid stenosis is based on velocity parameters that correlate the residual internal carotid diameter with NASCET-based stenosis levels, using the diameter of the distal internal carotid lumen as the denominator for stenosis measurement. The following velocity measurements were obtained: RIGHT ICA: 105/31 cm/sec CCA: 811/91 cm/sec SYSTOLIC ICA/CCA RATIO:  0.8 ECA: 201 cm/sec LEFT ICA: 126/40 cm/sec CCA: 478/29 cm/sec SYSTOLIC ICA/CCA RATIO:  0.8 ECA: 166 cm/sec RIGHT CAROTID ARTERY: There is a minimal to moderate amount of eccentric mixed echogenic plaque within the right carotid bulb (image 16), extending to involve the origin and proximal aspects of the right internal carotid artery (image 24), morphologically unchanged compared to the 11/2018 examination, though not definitely resulting in elevated peak systolic velocities within the internal carotid artery to suggest a hemodynamically significant stenosis. RIGHT VERTEBRAL ARTERY:  Antegrade Flow LEFT CAROTID ARTERY: There is a minimal to moderate amount of eccentric echogenic plaque involving the left carotid bulb, extending to involve the origin and proximal aspects of the left internal carotid artery (image 58),  morphologically unchanged compared to the 11/2018 examination and again results in elevated peak systolic velocities within the left internal carotid artery. Greatest acquired peak systolic velocity within the distal aspect of the left ICA measures 143 cm/sec (image 67) previously, greatest acquired peak systolic velocity within the mid left ICA measured 127 cm/sec. LEFT VERTEBRAL ARTERY:  Antegrade flow IMPRESSION: 1. Minimal to moderate amount of left-sided atherosclerotic plaque again results in elevated peak systolic velocities within left internal carotid artery compatible with the 50-69% luminal narrowing range. Further evaluation with CTA could performed as clinically indicated. 2. Minimal to moderate amount of right-sided atherosclerotic plaque, morphologically similar to the 11/2018 examination though not definitely resulting in a hemodynamically significant stenosis. Electronically Signed   By: Sandi Mariscal M.D.   On: 03/05/2019 10:05   Dg Chest Port 1 View  Result Date: 03/04/2019 CLINICAL DATA:  Shortness of breath EXAM: PORTABLE CHEST 1 VIEW COMPARISON:  02/16/2019, CT 02/15/2019 FINDINGS: No acute airspace disease or effusion. Postsurgical changes in the left hilus. Borderline cardiomegaly. No pneumothorax. IMPRESSION: No active disease.  Postsurgical changes on  the left Electronically Signed   By: Donavan Foil M.D.   On: 03/04/2019 17:25     Assessment/Plan:   68 y.o. female  with hx of anxiety, DM, fibromyalgia,GERD, sleep apnea noncompliance with CPAP due to broken equipment for 6 months for obstructive sleep apnea, schizophrenia, was found by home health nurse with complaints of slurred speech, numbness of her fingers and mouth-Per patient she has had numbness in these areas since September of last year, complains of shortness of breath, dry mouth for 5 months, noted to be hypoxic with O2 saturation in the 80s.  Pt does follow up with Dr. Manuella Ghazi as out patient for suspected seizures which  patient can't explain to me.  She is on Keppra 500 BID  - Unclear what real speech changes are.  It stuttering at time and others clear dysarthria.  States been going on for a while.  She does have generalized weakness and tremor.   - MRI brain no acute abnormalities - f/up dr. Manuella Ghazi as out pt.  - This doesn't change through out the day so don't think this is Myesthenia.  No clear facial weakness to point to peripheral demyelination and presence of reflexes  03/06/2019, 12:28 PM
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
## 2019-03-07 DIAGNOSIS — J9601 Acute respiratory failure with hypoxia: Secondary | ICD-10-CM | POA: Diagnosis not present

## 2019-03-07 DIAGNOSIS — E119 Type 2 diabetes mellitus without complications: Secondary | ICD-10-CM | POA: Diagnosis not present

## 2019-03-07 DIAGNOSIS — R4781 Slurred speech: Secondary | ICD-10-CM | POA: Diagnosis not present

## 2019-03-07 DIAGNOSIS — G4733 Obstructive sleep apnea (adult) (pediatric): Secondary | ICD-10-CM | POA: Diagnosis not present

## 2019-03-07 LAB — GLUCOSE, CAPILLARY
Glucose-Capillary: 121 mg/dL — ABNORMAL HIGH (ref 70–99)
Glucose-Capillary: 104 mg/dL — ABNORMAL HIGH (ref 70–99)
Glucose-Capillary: 108 mg/dL — ABNORMAL HIGH (ref 70–99)
Glucose-Capillary: 131 mg/dL — ABNORMAL HIGH (ref 70–99)
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
## 2019-03-07 NOTE — Plan of Care (Signed)
  Problem: Education: Goal: Knowledge of disease or condition will improve Outcome: Progressing Goal: Knowledge of secondary prevention will improve Outcome: Progressing Goal: Knowledge of patient specific risk factors addressed and post discharge goals established will improve Outcome: Progressing Goal: Individualized Educational Video(s) Outcome: Progressing   Problem: Coping: Goal: Will identify appropriate support needs Outcome: Progressing   Problem: Health Behavior/Discharge Planning: Goal: Ability to manage health-related needs will improve Outcome: Progressing   Problem: Self-Care: Goal: Ability to communicate needs accurately will improve Outcome: Progressing   Problem: Intracerebral Hemorrhage Tissue Perfusion: Goal: Complications of Intracerebral Hemorrhage will be minimized Outcome: Progressing   Problem: Ischemic Stroke/TIA Tissue Perfusion: Goal: Complications of ischemic stroke/TIA will be minimized Outcome: Progressing

## 2019-03-08 DIAGNOSIS — I10 Essential (primary) hypertension: Secondary | ICD-10-CM | POA: Diagnosis not present

## 2019-03-08 DIAGNOSIS — M6281 Muscle weakness (generalized): Secondary | ICD-10-CM | POA: Diagnosis not present

## 2019-03-08 DIAGNOSIS — R069 Unspecified abnormalities of breathing: Secondary | ICD-10-CM | POA: Diagnosis not present

## 2019-03-08 DIAGNOSIS — R251 Tremor, unspecified: Secondary | ICD-10-CM | POA: Diagnosis not present

## 2019-03-08 DIAGNOSIS — G4730 Sleep apnea, unspecified: Secondary | ICD-10-CM | POA: Diagnosis not present

## 2019-03-08 DIAGNOSIS — Z7689 Persons encountering health services in other specified circumstances: Secondary | ICD-10-CM | POA: Diagnosis not present

## 2019-03-08 DIAGNOSIS — R1312 Dysphagia, oropharyngeal phase: Secondary | ICD-10-CM | POA: Diagnosis not present

## 2019-03-08 DIAGNOSIS — J9601 Acute respiratory failure with hypoxia: Secondary | ICD-10-CM | POA: Diagnosis not present

## 2019-03-08 DIAGNOSIS — R200 Anesthesia of skin: Secondary | ICD-10-CM | POA: Diagnosis not present

## 2019-03-08 DIAGNOSIS — M797 Fibromyalgia: Secondary | ICD-10-CM | POA: Diagnosis not present

## 2019-03-08 DIAGNOSIS — K219 Gastro-esophageal reflux disease without esophagitis: Secondary | ICD-10-CM | POA: Diagnosis not present

## 2019-03-08 DIAGNOSIS — E119 Type 2 diabetes mellitus without complications: Secondary | ICD-10-CM | POA: Diagnosis not present

## 2019-03-08 DIAGNOSIS — E785 Hyperlipidemia, unspecified: Secondary | ICD-10-CM | POA: Diagnosis not present

## 2019-03-08 DIAGNOSIS — J9621 Acute and chronic respiratory failure with hypoxia: Secondary | ICD-10-CM | POA: Diagnosis not present

## 2019-03-08 DIAGNOSIS — R0902 Hypoxemia: Secondary | ICD-10-CM | POA: Diagnosis not present

## 2019-03-08 DIAGNOSIS — I6930 Unspecified sequelae of cerebral infarction: Secondary | ICD-10-CM | POA: Diagnosis not present

## 2019-03-08 DIAGNOSIS — G4733 Obstructive sleep apnea (adult) (pediatric): Secondary | ICD-10-CM | POA: Diagnosis not present

## 2019-03-08 DIAGNOSIS — R262 Difficulty in walking, not elsewhere classified: Secondary | ICD-10-CM | POA: Diagnosis not present

## 2019-03-08 DIAGNOSIS — Z7401 Bed confinement status: Secondary | ICD-10-CM | POA: Diagnosis not present

## 2019-03-08 DIAGNOSIS — R296 Repeated falls: Secondary | ICD-10-CM | POA: Diagnosis not present

## 2019-03-08 DIAGNOSIS — R4781 Slurred speech: Secondary | ICD-10-CM | POA: Diagnosis not present

## 2019-03-08 DIAGNOSIS — R531 Weakness: Secondary | ICD-10-CM | POA: Diagnosis not present

## 2019-03-08 DIAGNOSIS — I639 Cerebral infarction, unspecified: Secondary | ICD-10-CM | POA: Diagnosis not present

## 2019-03-08 DIAGNOSIS — R471 Dysarthria and anarthria: Secondary | ICD-10-CM | POA: Diagnosis not present

## 2019-03-08 DIAGNOSIS — Z96652 Presence of left artificial knee joint: Secondary | ICD-10-CM | POA: Diagnosis not present

## 2019-03-08 DIAGNOSIS — R682 Dry mouth, unspecified: Secondary | ICD-10-CM | POA: Diagnosis not present

## 2019-03-08 DIAGNOSIS — M2550 Pain in unspecified joint: Secondary | ICD-10-CM | POA: Diagnosis not present

## 2019-03-08 DIAGNOSIS — Z9119 Patient's noncompliance with other medical treatment and regimen: Secondary | ICD-10-CM | POA: Diagnosis not present

## 2019-03-08 DIAGNOSIS — I69328 Other speech and language deficits following cerebral infarction: Secondary | ICD-10-CM | POA: Diagnosis not present

## 2019-03-08 DIAGNOSIS — Z79899 Other long term (current) drug therapy: Secondary | ICD-10-CM | POA: Diagnosis not present

## 2019-03-08 DIAGNOSIS — R0602 Shortness of breath: Secondary | ICD-10-CM | POA: Diagnosis not present

## 2019-03-08 DIAGNOSIS — R2689 Other abnormalities of gait and mobility: Secondary | ICD-10-CM | POA: Diagnosis not present

## 2019-03-08 DIAGNOSIS — M069 Rheumatoid arthritis, unspecified: Secondary | ICD-10-CM | POA: Diagnosis not present

## 2019-03-08 DIAGNOSIS — R1311 Dysphagia, oral phase: Secondary | ICD-10-CM | POA: Diagnosis not present

## 2019-03-08 DIAGNOSIS — Z1159 Encounter for screening for other viral diseases: Secondary | ICD-10-CM | POA: Diagnosis not present

## 2019-03-08 LAB — GLUCOSE, CAPILLARY
Glucose-Capillary: 111 mg/dL — ABNORMAL HIGH (ref 70–99)
Glucose-Capillary: 87 mg/dL (ref 70–99)

## 2019-03-08 LAB — SARS CORONAVIRUS 2 BY RT PCR (HOSPITAL ORDER, PERFORMED IN ~~LOC~~ HOSPITAL LAB): SARS Coronavirus 2: NEGATIVE

## 2019-03-08 MED ORDER — POLYETHYLENE GLYCOL 3350 17 G PO PACK
17.0000 g | PACK | Freq: Every day | ORAL | Status: DC
  Administered 2019-03-08: 15:00:00 17 g via ORAL
  Filled 2019-03-08: qty 1

## 2019-03-08 NOTE — TOC Progression Note (Signed)
Transition of Care [facility]) - Progression Note    Patient Details  Name: Lauren [name] MRN: [id number] Date of Birth: 1951/02/27  Transition of Care [facility]) CM/SW Contact  Jacquelynne Guedes, Lenice Llamas Phone Number: [phone]  03/08/2019, 8:55 AM  Clinical Narrative:   Per weekend Clinical Social Worker (CSW) patient accepted bed offer from [facility]. Per [facility] admissions coordinator at [facility] she started [facility] SNF authorization yesterday and requires another covid test. MD and charge RN aware of above.       Expected Discharge Plan: [place] Barriers to Discharge: No Barriers Identified  Expected Discharge Plan and Services Expected Discharge Plan: [place] In-house Referral: Clinical Social Work Discharge Planning Services: CM Consult Post Acute Care Choice: Resumption of Svcs/PTA Provider, Home Health Living arrangements for the past 2 months: Apartment Expected Discharge Date: 03/06/19                         HH Arranged: RN, PT, OT, Nurse's Aide, Social Work [employer] Agency: [place] ([place]) Date [place]: 03/05/19 Time [place]: [place] Representative spoke with at [place]: [place] ([place]) Interventions    Readmission Risk Interventions Readmission Risk Prevention Plan 02/16/2019  Transportation Screening Complete  PCP or Specialist Appt within 5-7 Days Complete  Home Care Screening Complete  Medication Review (RN CM) Complete  Some recent data might be hidden
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
## 2019-03-08 NOTE — Progress Notes (Signed)
   03/08/19 1000  Clinical Encounter Type  Visited With Patient;Health care provider  Visit Type Initial  Ch was rounding. Pt was sitting in a chair. Pt is going to a facility for recovery. 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 Pt showed her excitement for the opportunity to be reconnected with some of folks she knew before. Pt appreciated the visit.

## 2019-03-08 NOTE — Progress Notes (Signed)
SLP Cancellation Note  Patient Details Name: Lauren [name] MRN: [id number] DOB: [birth year]   Cancelled treatment:       Reason Eval/Treat Not Completed: (chart reviewed; consulted NSG and pt re: status). Pt is currently tolerating her oral diet w/ No overt s/s of aspiration noted by NSG or pt. Pt does follow general aspiration precautions at meals. She continues to present w/ dysarthria (speech decline) which has been present for ~4+ months per pt report. Recommended she re-schedule her appointment w/ Dr. Joselyn Arrow at [facility] for a full assessment. Pt can then f/u w/ skilled ST services once a dx has been determined. Pt experiences dry mouth and uses a mouth rinse for such per NSG.  ST services will be available if any decline in status while admitted.   Orinda Kenner, MS, CCC-SLP Watson,Katherine 03/08/2019, 3:18 PM

## 2019-03-08 NOTE — Progress Notes (Signed)
[facility] [place] NAME: Lauren [name]    MR#:  [id number]  DATE OF BIRTH:  1951/08/05  SUBJECTIVE:   Still has stuttering of speech.  No pain. Chronic shortness of breath is the same.  REVIEW OF SYSTEMS:    Review of Systems  Constitutional: Negative for fever, chills weight loss HENT: Negative for ear pain, nosebleeds, congestion, facial swelling, rhinorrhea, neck pain, neck stiffness and ear discharge.   Respiratory: Negative for cough, shortness of breath, wheezing  Cardiovascular: Negative for chest pain, palpitations and leg swelling.  Gastrointestinal: Negative for heartburn, abdominal pain, vomiting, diarrhea or consitpation Genitourinary: Negative for dysuria, urgency, frequency, hematuria Musculoskeletal: Negative for back pain or joint pain Neurological: Negative for dizziness, seizures, syncope, focal weakness,  numbness and headaches.  Hematological: Does not bruise/bleed easily.  Psychiatric/Behavioral: Negative for hallucinations, confusion, dysphoric mood  Tolerating Diet: yes  DRUG ALLERGIES:   Allergies  Allergen Reactions  . Percocet [Oxycodone-Acetaminophen] Diarrhea, Nausea And Vomiting and Nausea Only  . Tramadol Hcl Diarrhea, Nausea And Vomiting and Nausea Only  . Vicodin [Hydrocodone-Acetaminophen] Diarrhea, Nausea And Vomiting and Nausea Only    VITALS:  Blood pressure 99/61, pulse 66, temperature 97.8 F (36.6 C), temperature source Oral, resp. rate 16, height 5\' 5"  (1.651 m), weight 100.5 kg, SpO2 95 %.  PHYSICAL EXAMINATION:  Constitutional: Appears well-developed and well-nourished. No distress. HENT: Normocephalic. [name] Oropharynx is clear and moist.  Eyes: Conjunctivae and EOM are normal. PERRLA, no scleral icterus.  Neck: Normal ROM. Neck supple. No JVD. No tracheal deviation. CVS: RRR, S1/S2 +, no murmurs, no gallops, no carotid bruit.  Pulmonary: Effort and breath sounds normal, no stridor,  rhonchi, wheezes, rales.  Abdominal: Soft. BS +,  no distension, tenderness, rebound or guarding.  Musculoskeletal: Normal range of motion. No edema and no tenderness.  Neuro: Alert. CN 2-12 grossly intact. No focal deficits. Skin: Skin is warm and dry. No rash noted. Psychiatric: Normal mood and affect.      LABORATORY PANEL:   CBC Recent Labs  Lab 03/04/19 1344  WBC 5.3  HGB 11.4*  HCT 32.6*  PLT 244   ------------------------------------------------------------------------------------------------------------------  Chemistries  Recent Labs  Lab 03/04/19 1344  NA 139  K 3.6  CL 106  CO2 19*  GLUCOSE 104*  BUN 27*  CREATININE 1.24*  CALCIUM 9.1  MG 1.3*  AST 23  ALT 16  ALKPHOS 61  BILITOT 1.6*   ------------------------------------------------------------------------------------------------------------------  Cardiac Enzymes No results for input(s): TROPONINI in the last 168 hours. ------------------------------------------------------------------------------------------------------------------  RADIOLOGY:  No results found.   ASSESSMENT AND PLAN:   68 year old female with history of diabetes, morbid obesity and sleep apnea with broken CPAP machine for over 6 months who was failed by her home health nurse with complaints of slurred speech and numbness of her right arm.  1.  Acute on chronic hypoxic respiratory failure due to underlying sleep apnea with noncompliance of broken CPAP  2.  Sleep apnea: Case management assisting with discharge planning for her CPAP machine.  3.  Slurred speech with chronic paresthesia: Patient eval by neurology.  CVA work-up essentially unremarkable. Patient needs EMG as an outpatient by her neurologist as per recommendations by neurology.  4.  Diabetes: Patient will continue outpatient regimen with ADA diet  5.  Seizure disorder: Continue Keppra  Waiting for insurance authorization so that patient can be discharged  to skilled nursing facility.  CODE STATUS: Full  TOTAL TIME TAKING CARE OF THIS  PATIENT: 30 minutes.    Lauren [name] M.D on 03/08/2019 at 2:14 PM  Between 7am to 6pm - Pager - [phone]  After 6pm go to [email or web address] - password [email or web address] [place] [facility]  Office  [phone]  CC: Primary care physician; Steele Sizer, MD  Note: This dictation was prepared with Dragon dictation along with smaller phrase technology. Any transcriptional errors that result from this process are unintentional.

## 2019-03-08 NOTE — TOC Transition Note (Signed)
Transition of Care [facility]) - CM/SW Discharge Note   Patient Details  Name: Lauren [name] MRN: [id number] Date of Birth: [birth year]  [facility] CM/SW Contact:  Guinevere Stephenson, Lenice Llamas Phone Number: [phone]  03/08/2019, 3:28 PM   Clinical Narrative: Patient is medically stable for D/C to [facility] today. Patient's covid test is negative today 6/29. Per [facility] admissions coordinator at [facility] SNF authorization has been received and Lauren [name] has a cpap for patient. CSW gave [name] cpap settings E-pap 10, flow 2 liters, per respiratory therapy. RN will call report and arrange EMS for transport. Clinical Education officer, museum (CSW) sent D/C orders to [facility] via [place]. CSW contacted [place] worker Lauren [name] and made her aware of above. Patient is aware of above. CSW contacted patient's sister Lauren [name] and made her aware of above. Please reconsult if future social work needs arise. CSW signing off.       Final next level of care: Skilled Nursing Facility Barriers to Discharge: Barriers Resolved   Patient Goals and CMS Choice Patient states their goals for this hospitalization and ongoing recovery are:: Improve mobility CMS Medicare.gov Compare Post Acute Care list provided to:: Patient Choice offered to / list presented to : Patient  Discharge Placement   Existing PASRR number confirmed : 03/05/19          Patient [facility] bed at: [facility] Patient to be transferred to facility by: [facility] Name of family member notified: Patient's sister Lauren [name] is aware of D/C today. Patient and family notified of of transfer: 03/08/19  Discharge Plan and Services In-house Referral: Clinical Social Work Discharge Planning Services: CM Consult Post Acute Care Choice: Resumption of Product/process development scientist, Home Health                    HH Arranged: RN, PT, OT, Nurse's Aide, Social Work [employer] Agency: [place]  ([place]) Date [place]: 03/05/19 Time [place]: [place] Representative spoke with at [place]: [place] ([place]) Interventions     Readmission Risk Interventions Readmission Risk Prevention Plan 02/16/2019  Transportation Screening Complete  PCP or Specialist Appt within 5-7 Days Complete  Home Care Screening Complete  Medication Review (RN CM) Complete  Some recent data might be hidden

## 2019-03-08 NOTE — Progress Notes (Signed)
Patient discharged to [facility] per MD order. Report given to [place] at facility. EMS called for transport.

## 2019-03-09 ENCOUNTER — Ambulatory Visit: Payer: Self-pay | Admitting: Pharmacist

## 2019-03-09 DIAGNOSIS — R200 Anesthesia of skin: Secondary | ICD-10-CM

## 2019-03-09 DIAGNOSIS — E873 Alkalosis: Secondary | ICD-10-CM

## 2019-03-09 DIAGNOSIS — R202 Paresthesia of skin: Secondary | ICD-10-CM

## 2019-03-09 DIAGNOSIS — F200 Paranoid schizophrenia: Secondary | ICD-10-CM

## 2019-03-09 DIAGNOSIS — J9621 Acute and chronic respiratory failure with hypoxia: Secondary | ICD-10-CM | POA: Diagnosis not present

## 2019-03-09 DIAGNOSIS — R682 Dry mouth, unspecified: Secondary | ICD-10-CM | POA: Diagnosis not present

## 2019-03-09 DIAGNOSIS — R0602 Shortness of breath: Secondary | ICD-10-CM | POA: Diagnosis not present

## 2019-03-09 NOTE — Chronic Care Management (AMB) (Signed)
  Chronic Care Management   Note  03/09/2019 Name: Oluwaseun Bruyere MRN: [id number] DOB: 1950-09-28  Care coordination for Flossie Dibble, 68 year old female patient of Dr. Steele Sizer previously engaged with CCM clinical pharmacist but lost to follow up.   CCM pharmacist planned outreach today to re-engage with patient but noted patient has had multiple recent hospitalizations and has recently been discharged to a SNF. RNCM Trish Fountain is working with patient and family to address level of care concerns. CCM pharmacist will re-engage with patient as necessary following discharge from SNF  Follow up plan: The care management team is available to follow up with the patient after provider conversation with the patient regarding recommendation for care management engagement and subsequent re-referral to the care management team.   Ruben Reason, PharmD Clinical Pharmacist [place] [phone]

## 2019-03-15 ENCOUNTER — Encounter: Payer: Self-pay | Admitting: Family Medicine

## 2019-03-15 ENCOUNTER — Other Ambulatory Visit: Payer: Self-pay

## 2019-03-15 ENCOUNTER — Ambulatory Visit (INDEPENDENT_AMBULATORY_CARE_PROVIDER_SITE_OTHER): Payer: Medicare Other | Admitting: Family Medicine

## 2019-03-15 ENCOUNTER — Ambulatory Visit: Payer: Self-pay

## 2019-03-15 DIAGNOSIS — F200 Paranoid schizophrenia: Secondary | ICD-10-CM

## 2019-03-15 DIAGNOSIS — R4781 Slurred speech: Secondary | ICD-10-CM

## 2019-03-15 DIAGNOSIS — R0602 Shortness of breath: Secondary | ICD-10-CM

## 2019-03-15 DIAGNOSIS — R471 Dysarthria and anarthria: Secondary | ICD-10-CM | POA: Diagnosis not present

## 2019-03-15 DIAGNOSIS — E873 Alkalosis: Secondary | ICD-10-CM

## 2019-03-15 NOTE — Chronic Care Management (AMB) (Signed)
  Chronic Care Management   Note  03/15/2019 Name: Lauren [name] MRN: [id number] DOB: 1951-02-15  Care Coordination: Successful telephone encounter to Ms. [name] to follow up on current residence and plan upon discharge from SNF. [name] is currently residing at [facility] and continues to be under the primary care of Dr. Steele Sizer. There has been concern by famiily (sister) and community agency [facility]) that [name] is unable to safely care for her self at home. Patient states there is no plan for discharge back into community at this time. RN CM left VM message for [name] RN [facility] RN) requesting return call to discuss and collaborate regarding plan for patient upon discharge from SNF.  Plan: Will await return call from [name], RN, BSN Nurse Care Coordinator [facility] / [name] Care Management  [phone]

## 2019-03-15 NOTE — Chronic Care Management (AMB) (Signed)
  Chronic Care Management   Note  03/15/2019 Name: Natavia Sublette MRN: [id number] DOB: 1951-04-02  Care Coordination: RN CM successfully returned call to Ms. Elicia Lamp, RN with [name]. Ms Orlie Dakin states the plan remains for patient to go to group home or Assisted living facility post discharge from SNF. She continues to collaborate with [place] provider and [name] to insure patient discharges to the most appropriate setting post rehab. Ms. Orlie Dakin will continue to update RN CM on patients progression in SNF and discharge status.  Plan: Will await updated information from [place]. Rollene Rotunda, RN, BSN Nurse Care Coordinator [facility] Care Management  [phone]

## 2019-03-15 NOTE — Progress Notes (Signed)
Name: Lauren [name]   MRN: [id number]    DOB: 1950-10-04   Date:03/15/2019       Progress Note  Subjective  Chief Complaint  Chief Complaint  Patient presents with  . Shortness of Breath    I connected with  Lauren [name] on 03/15/19 at  2:40 PM EDT by telephone and verified that I am speaking with the correct person using two identifiers.  I discussed the limitations, risks, security and privacy concerns of performing an evaluation and management service by telephone and the availability of in person appointments. Staff also discussed with the patient that there may be a patient responsible charge related to this service. Patient Location: [place]  Provider Location: [facility] Discharge follow up: she was admitted to [facility] on 03/04/2019 for SOB , dry mouth and low magnesium. She was discharged to [facility] so she can have better, however patient states she prefers going home. She states she is feeling better but still has SOB, she was sent home on  nasal cannula oxygen but currently only using CPAP machine at night . She had a CT angioagram and also MR brain while there. She has a Publishing rights manager, psychiatrist and also pulmonologist. Explained she needs to follow up with them.    Patient Active Problem List   Diagnosis Date Noted  . Slurred speech 03/04/2019  . Chest pain 02/16/2019  . Chest pain in adult 02/15/2019  . Acute respiratory failure ([place]) 02/15/2019  . Seizures ([place]) 11/24/2018  . Paresthesias 11/22/2018  . Dry mouth 10/12/2018  . Hx of resection of meningioma 07/21/2018  . Tremor 07/21/2018  . Schizoaffective disorder ([place]) 12/29/2017  . Morbid obesity ([place]) 12/09/2017  . Meningioma ([place]) 11/25/2017  . Atherosclerosis of aorta ([place]) 11/25/2017  . Calcification of coronary artery 11/25/2017  . Schizophrenia ([place]) 11/04/2017  . Acid reflux 06/29/2015  . Diabetes mellitus type 2, controlled,  without complications ([place]) 24/23/5361  . Cardiac murmur 06/29/2015  . Arthritis of knee, degenerative 06/28/2015  . Insomnia w/ sleep apnea 03/21/2015  . Anxiety disorder 03/21/2015  . Hypertension 03/21/2015  . HLD (hyperlipidemia) 03/21/2015  . Urge incontinence 03/21/2015    Past Surgical History:  Procedure Laterality Date  . BRAIN TUMOR EXCISION  2012   benign  . CARDIAC CATHETERIZATION  2008  . CESAREAN SECTION    . CYST REMOVAL HAND    . LUNG LOBECTOMY  1977   benign tumor  . TOTAL KNEE ARTHROPLASTY Left 01/28/2017   Procedure: TOTAL KNEE ARTHROPLASTY;  Surgeon: Corky Mull, MD;  Location: [facility] ORS;  Service: Orthopedics;  Laterality: Left;    Family History  Problem Relation Age of Onset  . Heart disease Brother   . Depression Mother   . Heart attack Mother   . Stroke Mother   . Alcohol abuse Father   . Stroke Father   . Diabetes Sister   . Diabetes Sister   . Stomach cancer Sister   . Kidney disease Sister   . COPD Brother   . Lung cancer Brother   . Diabetes Brother     Social History   Socioeconomic History  . Marital status: Single    Spouse name: Not on file  . Number of children: 1  . Years of education: Not on file  . Highest education level: 12th grade  Occupational History  . Not on file  Social Needs  . Financial resource strain: Somewhat hard  .  Food insecurity    Worry: Sometimes true    Inability: Never true  . Transportation needs    Medical: No    Non-medical: No  Tobacco Use  . Smoking status: Never Smoker  . Smokeless tobacco: Never Used  Substance and Sexual Activity  . Alcohol use: No    Alcohol/week: 0.0 standard drinks  . Drug use: No  . Sexual activity: Not Currently  Lifestyle  . Physical activity    Days per week: 0 days    Minutes per session: 0 min  . Stress: To some extent  Relationships  . Social connections    Talks on phone: More than three times a week    Gets together: Three times a week    Attends  religious service: More than 4 times per year    Active member of club or organization: No    Attends meetings of clubs or organizations: Never    Relationship status: Married  . Intimate partner violence    Fear of current or ex partner: No    Emotionally abused: No    Physically abused: No    Forced sexual activity: No  Other Topics Concern  . Not on file  Social History Narrative  . Not on file     Current Outpatient Medications:  .  Artificial Saliva (BIOTENE DRY MOUTH MOISTURIZING) SOLN, Use as directed 1 application in the mouth or throat 2 (two) times daily., Disp: 44.3 mL, Rfl: 5 .  Ascorbic Acid 500 MG CHEW, Chew 1 tablet by mouth daily., Disp: , Rfl:  .  aspirin EC 81 MG tablet, Take 1 tablet (81 mg total) by mouth daily before breakfast., Disp: 30 tablet, Rfl: 5 .  atorvastatin (LIPITOR) 40 MG tablet, Take 1 tablet (40 mg total) by mouth daily at 6 PM., Disp: 30 tablet, Rfl: 2 .  benztropine (COGENTIN) 1 MG tablet, Take 1 tablet by mouth 3 (three) times daily., Disp: , Rfl:  .  Dentifrices (BIOTENE DRY MOUTH GENTLE) PSTE, Place 1 application onto teeth 2 (two) times daily., Disp: 121.9 g, Rfl: 5 .  Elastic Bandages & Supports (KNEE COMPRESSION SLEEVE/L/XL) MISC, 2 each by Does not apply route as needed. Wear during day; take off at night, Disp: 2 each, Rfl: 0 .  escitalopram (LEXAPRO) 10 MG tablet, Take 10 mg by mouth daily. , Disp: , Rfl:  .  gabapentin (NEURONTIN) 100 MG capsule, Take 100 mg by mouth 2 (two) times daily., Disp: , Rfl:  .  haloperidol decanoate (HALDOL DECANOATE) 100 MG/ML injection, , Disp: , Rfl:  .  hydrochlorothiazide (HYDRODIURIL) 25 MG tablet, Take 1 tablet (25 mg total) by mouth daily., Disp: 30 tablet, Rfl: 2 .  ibuprofen (ADVIL,MOTRIN) 200 MG tablet, Take 600 mg by mouth every 8 (eight) hours as needed (for knee pain.)., Disp: , Rfl:  .  levETIRAcetam (KEPPRA) 500 MG tablet, Take 1 tablet by mouth 2 (two) times a day., Disp: , Rfl:  .  lisinopril  (ZESTRIL) 20 MG tablet, Take 1 tablet (20 mg total) by mouth daily., Disp: 30 tablet, Rfl: 2 .  metFORMIN (GLUCOPHAGE) 500 MG tablet, Take 1 tablet (500 mg total) by mouth 2 (two) times daily with a meal., Disp: 30 tablet, Rfl: 2 .  omeprazole (PRILOSEC) 20 MG capsule, Take 1 capsule (20 mg total) by mouth 2 (two) times daily., Disp: 180 capsule, Rfl: 0 .  traZODone (DESYREL) 50 MG tablet, Take 1 tablet (50 mg total) by mouth at bedtime., Disp:  30 tablet, Rfl: 0  Allergies  Allergen Reactions  . Percocet [Oxycodone-Acetaminophen] Diarrhea, Nausea And Vomiting and Nausea Only  . Tramadol Hcl Diarrhea, Nausea And Vomiting and Nausea Only  . Vicodin [Hydrocodone-Acetaminophen] Diarrhea, Nausea And Vomiting and Nausea Only    I personally reviewed active problem list, medication list, allergies, family history, social history with the patient/caregiver today.   ROS  Ten systems reviewed and is negative except as mentioned in HPI   Objective  Virtual encounter, vitals not obtained.  There is no height or weight on file to calculate BMI.  Physical Exam  Awake and alert, poor historian, noticeable SOB while speaking and hyperventilating  PHQ2/9: Depression screen [facility] 2/9 02/26/2019 12/04/2018 10/22/2018 09/28/2018 08/03/2018  Decreased Interest 1 0 0 0 2  Down, Depressed, Hopeless 1 0 0 0 0  PHQ - 2 Score 2 0 0 0 2  Altered sleeping 1 0 0 0 0  Tired, decreased energy 2 3 3  0 3  Change in appetite 3 1 1  0 3  Feeling bad or failure about yourself  0 0 0 0 0  Trouble concentrating 0 0 3 0 2  Moving slowly or fidgety/restless 0 1 1 0 2  Suicidal thoughts 0 0 0 0 0  PHQ-9 Score 8 5 8  0 12  Difficult doing work/chores Somewhat difficult - Not difficult at all Not difficult at all Somewhat difficult  Some recent data might be hidden   PHQ-2/9 Result is positive.    Fall Risk: Fall Risk  02/26/2019 12/04/2018 10/22/2018 09/28/2018 09/10/2018  Falls in the past year? 0 1 1 1 1   Number falls  in past yr: 0 1 1 1 1   Injury with Fall? 0 0 0 0 0  Risk for fall due to : - - History of fall(s) - -  Follow up - - Falls prevention discussed Falls evaluation completed -     Assessment & Plan  1. SOB (shortness of breath)  On CPAP only, she states not using nasal cannula oxygen  She states frustrated because she still has SOB and nothing is being done at the facility she may as well go home   2. Dysarthria  Stable   I discussed the assessment and treatment plan with the patient. The patient was provided an opportunity to ask questions and all were answered. The patient agreed with the plan and demonstrated an understanding of the instructions.   The patient was advised to call back or seek an in-person evaluation if the symptoms worsen or if the condition fails to improve as anticipated.  I provided 25  minutes of non-face-to-face time during this encounter.  Loistine Chance, MD

## 2019-03-16 DIAGNOSIS — E119 Type 2 diabetes mellitus without complications: Secondary | ICD-10-CM | POA: Diagnosis not present

## 2019-03-16 DIAGNOSIS — M797 Fibromyalgia: Secondary | ICD-10-CM | POA: Diagnosis not present

## 2019-03-16 DIAGNOSIS — K219 Gastro-esophageal reflux disease without esophagitis: Secondary | ICD-10-CM | POA: Diagnosis not present

## 2019-03-16 DIAGNOSIS — J9621 Acute and chronic respiratory failure with hypoxia: Secondary | ICD-10-CM | POA: Diagnosis not present

## 2019-03-23 DIAGNOSIS — J9621 Acute and chronic respiratory failure with hypoxia: Secondary | ICD-10-CM | POA: Diagnosis not present

## 2019-03-24 ENCOUNTER — Other Ambulatory Visit: Payer: Self-pay

## 2019-03-24 DIAGNOSIS — Z7689 Persons encountering health services in other specified circumstances: Secondary | ICD-10-CM | POA: Diagnosis not present

## 2019-03-24 NOTE — Patient Outreach (Signed)
[place] [facility]) Care Management  03/24/2019  Lauren [name] [facility] 1950-11-24 [id number]   Medication Adherence call to Mrs. Lauren [name] patient did not answer patient is past due on Metformin 500 mg. Mrs. Lauren [name] is showing past due under [place].   [place] Management Direct Dial [phone]  Fax [phone] [email or web address]

## 2019-03-25 ENCOUNTER — Ambulatory Visit: Payer: Self-pay

## 2019-03-25 DIAGNOSIS — R471 Dysarthria and anarthria: Secondary | ICD-10-CM

## 2019-03-25 DIAGNOSIS — F200 Paranoid schizophrenia: Secondary | ICD-10-CM

## 2019-03-25 DIAGNOSIS — F419 Anxiety disorder, unspecified: Secondary | ICD-10-CM

## 2019-03-25 NOTE — Chronic Care Management (AMB) (Signed)
  Chronic Care Management   Note  03/25/2019 Name: Lauren [name] MRN: [id number] DOB: 21-May-1951  Care Coordination: Incoming call from Ms. Elicia Lamp, RN with [name] stating Ms. Bera was discharged from SNF to home today. Per Ms. Jaynie Crumble with [name] will complete home visit/assessment tomorrow. Milus Glazier will work on home cleaning and obtaining a personal care attendant for patient to assist her with daily care. Patient is discharge home with PT and OT per [name].   [name] states Ms. Bedoya needs assistance with obtaining a post D/C appointment with PCP. RN CM will facilitate.  Telephone follow up appointment with care management team member scheduled for: 1 week    Yenifer Saccente E. Rollene Rotunda, RN, BSN Nurse Care Coordinator [facility] Care Management  [phone]

## 2019-03-26 ENCOUNTER — Telehealth: Payer: Self-pay | Admitting: Family Medicine

## 2019-03-26 NOTE — Telephone Encounter (Signed)
Copied from [place] [id number]. Topic: General - Other >> Mar 26, 2019  3:32 PM Keene Breath wrote: Reason for CRM: Darlina Guys with [facility] called to inform the doctor that they will see the patient for skilled nursing on 03/28/19.  If there are any questions, please call at [phone].

## 2019-03-28 DIAGNOSIS — G4730 Sleep apnea, unspecified: Secondary | ICD-10-CM | POA: Diagnosis not present

## 2019-03-28 DIAGNOSIS — R1312 Dysphagia, oropharyngeal phase: Secondary | ICD-10-CM | POA: Diagnosis not present

## 2019-03-28 DIAGNOSIS — M797 Fibromyalgia: Secondary | ICD-10-CM | POA: Diagnosis not present

## 2019-03-28 DIAGNOSIS — I10 Essential (primary) hypertension: Secondary | ICD-10-CM | POA: Diagnosis not present

## 2019-03-28 DIAGNOSIS — K219 Gastro-esophageal reflux disease without esophagitis: Secondary | ICD-10-CM | POA: Diagnosis not present

## 2019-03-28 DIAGNOSIS — Z96652 Presence of left artificial knee joint: Secondary | ICD-10-CM | POA: Diagnosis not present

## 2019-03-28 DIAGNOSIS — I69328 Other speech and language deficits following cerebral infarction: Secondary | ICD-10-CM | POA: Diagnosis not present

## 2019-03-28 DIAGNOSIS — Z7984 Long term (current) use of oral hypoglycemic drugs: Secondary | ICD-10-CM | POA: Diagnosis not present

## 2019-03-28 DIAGNOSIS — E785 Hyperlipidemia, unspecified: Secondary | ICD-10-CM | POA: Diagnosis not present

## 2019-03-28 DIAGNOSIS — Z9181 History of falling: Secondary | ICD-10-CM | POA: Diagnosis not present

## 2019-03-28 DIAGNOSIS — J9621 Acute and chronic respiratory failure with hypoxia: Secondary | ICD-10-CM | POA: Diagnosis not present

## 2019-03-28 DIAGNOSIS — I2510 Atherosclerotic heart disease of native coronary artery without angina pectoris: Secondary | ICD-10-CM | POA: Diagnosis not present

## 2019-03-28 DIAGNOSIS — G4700 Insomnia, unspecified: Secondary | ICD-10-CM | POA: Diagnosis not present

## 2019-03-28 DIAGNOSIS — E119 Type 2 diabetes mellitus without complications: Secondary | ICD-10-CM | POA: Diagnosis not present

## 2019-03-29 ENCOUNTER — Telehealth: Payer: Self-pay | Admitting: Family Medicine

## 2019-03-29 NOTE — Telephone Encounter (Signed)
Caller/Agency: Estill Bamberg, RN with [facility]  Requesting: Skilled nursing  Frequency: 2w2, 1w7   Also requesting speech eval    [place] # [phone]

## 2019-03-30 ENCOUNTER — Ambulatory Visit: Payer: Self-pay

## 2019-03-30 DIAGNOSIS — F200 Paranoid schizophrenia: Secondary | ICD-10-CM

## 2019-03-30 DIAGNOSIS — R0602 Shortness of breath: Secondary | ICD-10-CM

## 2019-03-30 DIAGNOSIS — E873 Alkalosis: Secondary | ICD-10-CM

## 2019-03-30 DIAGNOSIS — Z748 Other problems related to care provider dependency: Secondary | ICD-10-CM

## 2019-03-30 NOTE — Chronic Care Management (AMB) (Signed)
  Chronic Care Management   Follow Up Note   03/30/2019 Name: Rhena Glace MRN: [id number] DOB: 10-21-1950  Referred by: Steele Sizer, MD Reason for referral : Chronic Care Management (incoming call from [place] requesting assistance)  Care Coordination: CCM RN CM received an incoming call from Elicia Lamp, patients nurse from [facility] requesting assistance with obtaining a post SNF discharge follow up appointment for Ms. Podolski. Ms. Orlie Dakin states Ms. Strider is doing well but still having some difficulty with breathing but does not require emergent treatment. Ms. Tipping son had moved to [place] which has left her with no assistance in the home. [name] involved and aware. Ms. Orlie Dakin states she will assist Ms. Lumadue with transportation to appointments.  RN CM collaborated with PCP office to obtain follow up visit with FNP Raelyn Ensign July 28 at 10:40. After previously collaboration with PCP Steele Sizer, MD, it is determined that patient needs to see neurosurgeon to assess breathing pattern and shortness of breath prior to her originally scheduled appointment in October. RN CM secured a new neurosurgery appointment on July 28 at 1:30 with Dr. Lacinda Axon.  Plan: RN CM will follow up with Ms. Cordial as previously scheduled 04/02/2019   Hrithik Boschee E. Rollene Rotunda, RN, BSN Nurse Care Coordinator [facility] / [facility] Care Management  [phone]

## 2019-03-30 NOTE — Telephone Encounter (Signed)
Left verbal orders for skilled nursing and speech evaluation for Estill Bamberg, RN with [facility].

## 2019-04-01 DIAGNOSIS — G4700 Insomnia, unspecified: Secondary | ICD-10-CM | POA: Diagnosis not present

## 2019-04-01 DIAGNOSIS — K219 Gastro-esophageal reflux disease without esophagitis: Secondary | ICD-10-CM | POA: Diagnosis not present

## 2019-04-01 DIAGNOSIS — M797 Fibromyalgia: Secondary | ICD-10-CM | POA: Diagnosis not present

## 2019-04-01 DIAGNOSIS — E119 Type 2 diabetes mellitus without complications: Secondary | ICD-10-CM | POA: Diagnosis not present

## 2019-04-01 DIAGNOSIS — Z96652 Presence of left artificial knee joint: Secondary | ICD-10-CM | POA: Diagnosis not present

## 2019-04-01 DIAGNOSIS — E785 Hyperlipidemia, unspecified: Secondary | ICD-10-CM | POA: Diagnosis not present

## 2019-04-01 DIAGNOSIS — I2510 Atherosclerotic heart disease of native coronary artery without angina pectoris: Secondary | ICD-10-CM | POA: Diagnosis not present

## 2019-04-01 DIAGNOSIS — I69328 Other speech and language deficits following cerebral infarction: Secondary | ICD-10-CM | POA: Diagnosis not present

## 2019-04-01 DIAGNOSIS — R1312 Dysphagia, oropharyngeal phase: Secondary | ICD-10-CM | POA: Diagnosis not present

## 2019-04-01 DIAGNOSIS — Z9181 History of falling: Secondary | ICD-10-CM | POA: Diagnosis not present

## 2019-04-01 DIAGNOSIS — J9621 Acute and chronic respiratory failure with hypoxia: Secondary | ICD-10-CM | POA: Diagnosis not present

## 2019-04-01 DIAGNOSIS — G4730 Sleep apnea, unspecified: Secondary | ICD-10-CM | POA: Diagnosis not present

## 2019-04-01 DIAGNOSIS — Z7984 Long term (current) use of oral hypoglycemic drugs: Secondary | ICD-10-CM | POA: Diagnosis not present

## 2019-04-01 DIAGNOSIS — I10 Essential (primary) hypertension: Secondary | ICD-10-CM | POA: Diagnosis not present

## 2019-04-02 ENCOUNTER — Ambulatory Visit: Payer: Self-pay

## 2019-04-02 ENCOUNTER — Telehealth: Payer: Self-pay | Admitting: Family Medicine

## 2019-04-02 ENCOUNTER — Other Ambulatory Visit: Payer: Self-pay

## 2019-04-02 DIAGNOSIS — E119 Type 2 diabetes mellitus without complications: Secondary | ICD-10-CM | POA: Diagnosis not present

## 2019-04-02 DIAGNOSIS — G4700 Insomnia, unspecified: Secondary | ICD-10-CM | POA: Diagnosis not present

## 2019-04-02 DIAGNOSIS — F200 Paranoid schizophrenia: Secondary | ICD-10-CM

## 2019-04-02 DIAGNOSIS — M797 Fibromyalgia: Secondary | ICD-10-CM | POA: Diagnosis not present

## 2019-04-02 DIAGNOSIS — J9621 Acute and chronic respiratory failure with hypoxia: Secondary | ICD-10-CM | POA: Diagnosis not present

## 2019-04-02 DIAGNOSIS — Z96652 Presence of left artificial knee joint: Secondary | ICD-10-CM | POA: Diagnosis not present

## 2019-04-02 DIAGNOSIS — E785 Hyperlipidemia, unspecified: Secondary | ICD-10-CM | POA: Diagnosis not present

## 2019-04-02 DIAGNOSIS — Z7984 Long term (current) use of oral hypoglycemic drugs: Secondary | ICD-10-CM | POA: Diagnosis not present

## 2019-04-02 DIAGNOSIS — I69328 Other speech and language deficits following cerebral infarction: Secondary | ICD-10-CM | POA: Diagnosis not present

## 2019-04-02 DIAGNOSIS — K219 Gastro-esophageal reflux disease without esophagitis: Secondary | ICD-10-CM | POA: Diagnosis not present

## 2019-04-02 DIAGNOSIS — Z9181 History of falling: Secondary | ICD-10-CM | POA: Diagnosis not present

## 2019-04-02 DIAGNOSIS — E873 Alkalosis: Secondary | ICD-10-CM

## 2019-04-02 DIAGNOSIS — I2510 Atherosclerotic heart disease of native coronary artery without angina pectoris: Secondary | ICD-10-CM | POA: Diagnosis not present

## 2019-04-02 DIAGNOSIS — I10 Essential (primary) hypertension: Secondary | ICD-10-CM | POA: Diagnosis not present

## 2019-04-02 DIAGNOSIS — G4730 Sleep apnea, unspecified: Secondary | ICD-10-CM | POA: Diagnosis not present

## 2019-04-02 DIAGNOSIS — R1312 Dysphagia, oropharyngeal phase: Secondary | ICD-10-CM | POA: Diagnosis not present

## 2019-04-02 NOTE — Telephone Encounter (Signed)
Tiffany calling of [facility] Pt.  Reporting that Patient blood Pressure  10150/100.  Denies any other Sx.  No head ache  Denies blurred vision.  Tiffany questions if she may still come out  to perform Pt. On Patient?

## 2019-04-02 NOTE — Telephone Encounter (Signed)
Verbal orders given to [place] with [facility] for PT.

## 2019-04-02 NOTE — Telephone Encounter (Signed)
  Reason for Disposition . [3] Systolic BP  >= 578 OR Diastolic >= 80 AND [9] pregnant  Answer Assessment - Initial Assessment Questions 1. BLOOD PRESSURE: "What is the blood pressure?" "Did you take at least two measurements 5 minutes apart?"     *No Answer*150/100  88hr  202. ONSET: "When did you take your blood pressure?"     *No Answer* 3. HOW: "How did you obtain the blood pressure?" (e.g., visiting nurse, automatic home BP monitor)    manual 4. HISTORY: "Do you have a history of high blood pressure?"     Patient does 5. MEDICATIONS: "Are you taking any medications for blood pressure?" "Have you missed any doses recently?"     denies 6. OTHER SYMPTOMS: "Do you have any symptoms?" (e.g., headache, chest pain, blurred vision, difficulty breathing, weakness)     denies7. PREGNANCY: "Is there any chance you are pregnant?" "When was your last menstrual period?"     na  Protocols used: HIGH BLOOD PRESSURE-A-AH

## 2019-04-02 NOTE — Chronic Care Management (AMB) (Signed)
  Chronic Care Management   Note  04/02/2019 Name: Lauren [name] MRN: [id number] DOB: [birth year]  Care Coordination: Successful telephone encounter to patient to assess for additional needs. Patient states she is being visited by [facility] Education officer, museum. RN CM spoke with social worker per patient request. Social worker Lauren [name]) states she was referred by [facility] RN. RN CM informed SW that patient is being followed closely by [facility] RN Lauren [name] and provide SW Lauren [name] contact. SW appreciative of collaboration.    Plan: No additional needs at this time as patient is being followed by multiple agencies including [name], [name], [name]. RN CM will be available if needs should arise.    Lauren Laramie E. Rollene Rotunda, RN, BSN Nurse Care Coordinator [facility] Care Management  [phone]

## 2019-04-02 NOTE — Telephone Encounter (Signed)
Copied from [place] [id number]. Topic: Quick Communication - Home Health Verbal Orders >> Apr 02, 2019  4:15 PM Nils Flack wrote: Caller/Agency: [place] Number: [phone] Requesting OT/PT/Skilled Nursing/Social Work/Speech Therapy: PT Frequency: 1 week 3 2 week 3 1 week 2

## 2019-04-05 NOTE — Telephone Encounter (Signed)
I called and spoke with patient, she does not have a bp cuff to check her bp. She said her bp was elevated because she forgot to take her medication. She had an appointment tomorrow with Raquel Sarna, but it has been canceled, she said she didn't cancel it. She does not have a way to get here tomorrow.

## 2019-04-06 ENCOUNTER — Ambulatory Visit: Payer: Medicare Other | Admitting: Family Medicine

## 2019-04-06 ENCOUNTER — Other Ambulatory Visit: Payer: Self-pay

## 2019-04-06 ENCOUNTER — Encounter: Payer: Self-pay | Admitting: Emergency Medicine

## 2019-04-06 ENCOUNTER — Inpatient Hospital Stay
Admission: EM | Admit: 2019-04-06 | Discharge: 2019-04-09 | DRG: 683 | Disposition: A | Discharge status: Home Health | Payer: Medicare Other | Attending: Specialist | Admitting: Specialist

## 2019-04-06 ENCOUNTER — Emergency Department: Payer: Medicare Other

## 2019-04-06 DIAGNOSIS — G629 Polyneuropathy, unspecified: Secondary | ICD-10-CM | POA: Diagnosis present

## 2019-04-06 DIAGNOSIS — E876 Hypokalemia: Secondary | ICD-10-CM

## 2019-04-06 DIAGNOSIS — Z96652 Presence of left artificial knee joint: Secondary | ICD-10-CM | POA: Diagnosis present

## 2019-04-06 DIAGNOSIS — K219 Gastro-esophageal reflux disease without esophagitis: Secondary | ICD-10-CM | POA: Diagnosis present

## 2019-04-06 DIAGNOSIS — N179 Acute kidney failure, unspecified: Secondary | ICD-10-CM | POA: Diagnosis present

## 2019-04-06 DIAGNOSIS — Z818 Family history of other mental and behavioral disorders: Secondary | ICD-10-CM

## 2019-04-06 DIAGNOSIS — Z86011 Personal history of benign neoplasm of the brain: Secondary | ICD-10-CM

## 2019-04-06 DIAGNOSIS — Z6841 Body Mass Index (BMI) 40.0 and over, adult: Secondary | ICD-10-CM | POA: Diagnosis not present

## 2019-04-06 DIAGNOSIS — M069 Rheumatoid arthritis, unspecified: Secondary | ICD-10-CM | POA: Diagnosis present

## 2019-04-06 DIAGNOSIS — Z923 Personal history of irradiation: Secondary | ICD-10-CM

## 2019-04-06 DIAGNOSIS — F259 Schizoaffective disorder, unspecified: Secondary | ICD-10-CM | POA: Diagnosis present

## 2019-04-06 DIAGNOSIS — I10 Essential (primary) hypertension: Secondary | ICD-10-CM | POA: Diagnosis present

## 2019-04-06 DIAGNOSIS — Z823 Family history of stroke: Secondary | ICD-10-CM | POA: Diagnosis not present

## 2019-04-06 DIAGNOSIS — Z800 Family history of malignant neoplasm of digestive organs: Secondary | ICD-10-CM

## 2019-04-06 DIAGNOSIS — Z8673 Personal history of transient ischemic attack (TIA), and cerebral infarction without residual deficits: Secondary | ICD-10-CM | POA: Diagnosis not present

## 2019-04-06 DIAGNOSIS — Z885 Allergy status to narcotic agent status: Secondary | ICD-10-CM | POA: Diagnosis not present

## 2019-04-06 DIAGNOSIS — G4733 Obstructive sleep apnea (adult) (pediatric): Secondary | ICD-10-CM | POA: Diagnosis present

## 2019-04-06 DIAGNOSIS — E119 Type 2 diabetes mellitus without complications: Secondary | ICD-10-CM

## 2019-04-06 DIAGNOSIS — Z811 Family history of alcohol abuse and dependence: Secondary | ICD-10-CM

## 2019-04-06 DIAGNOSIS — Z20828 Contact with and (suspected) exposure to other viral communicable diseases: Secondary | ICD-10-CM | POA: Diagnosis present

## 2019-04-06 DIAGNOSIS — C719 Malignant neoplasm of brain, unspecified: Secondary | ICD-10-CM | POA: Diagnosis not present

## 2019-04-06 DIAGNOSIS — M797 Fibromyalgia: Secondary | ICD-10-CM | POA: Diagnosis present

## 2019-04-06 DIAGNOSIS — E860 Dehydration: Secondary | ICD-10-CM | POA: Diagnosis present

## 2019-04-06 DIAGNOSIS — E785 Hyperlipidemia, unspecified: Secondary | ICD-10-CM | POA: Diagnosis present

## 2019-04-06 DIAGNOSIS — Z841 Family history of disorders of kidney and ureter: Secondary | ICD-10-CM

## 2019-04-06 DIAGNOSIS — M3500 Sicca syndrome, unspecified: Secondary | ICD-10-CM | POA: Diagnosis present

## 2019-04-06 DIAGNOSIS — Z8249 Family history of ischemic heart disease and other diseases of the circulatory system: Secondary | ICD-10-CM

## 2019-04-06 DIAGNOSIS — Z825 Family history of asthma and other chronic lower respiratory diseases: Secondary | ICD-10-CM

## 2019-04-06 DIAGNOSIS — D329 Benign neoplasm of meninges, unspecified: Secondary | ICD-10-CM | POA: Diagnosis not present

## 2019-04-06 DIAGNOSIS — Z801 Family history of malignant neoplasm of trachea, bronchus and lung: Secondary | ICD-10-CM

## 2019-04-06 DIAGNOSIS — R569 Unspecified convulsions: Secondary | ICD-10-CM | POA: Diagnosis not present

## 2019-04-06 DIAGNOSIS — R0600 Dyspnea, unspecified: Secondary | ICD-10-CM | POA: Diagnosis not present

## 2019-04-06 DIAGNOSIS — R0602 Shortness of breath: Secondary | ICD-10-CM | POA: Diagnosis not present

## 2019-04-06 DIAGNOSIS — Z833 Family history of diabetes mellitus: Secondary | ICD-10-CM

## 2019-04-06 DIAGNOSIS — R29898 Other symptoms and signs involving the musculoskeletal system: Secondary | ICD-10-CM | POA: Diagnosis not present

## 2019-04-06 LAB — CBC
HCT: 32.5 % — ABNORMAL LOW (ref 36.0–46.0)
Hemoglobin: 11.2 g/dL — ABNORMAL LOW (ref 12.0–15.0)
MCH: 32 pg (ref 26.0–34.0)
MCHC: 34.5 g/dL (ref 30.0–36.0)
MCV: 92.9 fL (ref 80.0–100.0)
Platelets: 270 10*3/uL (ref 150–400)
RBC: 3.5 MIL/uL — ABNORMAL LOW (ref 3.87–5.11)
RDW: 14.2 % (ref 11.5–15.5)
WBC: 8.7 10*3/uL (ref 4.0–10.5)
nRBC: 0 % (ref 0.0–0.2)

## 2019-04-06 LAB — URINALYSIS, ROUTINE W REFLEX MICROSCOPIC
Bilirubin Urine: NEGATIVE
Glucose, UA: NEGATIVE mg/dL
Hgb urine dipstick: NEGATIVE
Ketones, ur: NEGATIVE mg/dL
Nitrite: NEGATIVE
Protein, ur: 100 mg/dL — AB
Specific Gravity, Urine: 1.025 (ref 1.005–1.030)
WBC, UA: >50 WBC/hpf — ABNORMAL HIGH (ref 0–5)
pH: 5 (ref 5.0–8.0)

## 2019-04-06 LAB — MAGNESIUM: Magnesium: 1.4 mg/dL — ABNORMAL LOW (ref 1.7–2.4)

## 2019-04-06 LAB — BASIC METABOLIC PANEL
Anion gap: 15 (ref 5–15)
BUN: 23 mg/dL (ref 8–23)
CO2: 20 mmol/L — ABNORMAL LOW (ref 22–32)
Calcium: 9.3 mg/dL (ref 8.9–10.3)
Chloride: 103 mmol/L (ref 98–111)
Creatinine, Ser: 1.46 mg/dL — ABNORMAL HIGH (ref 0.44–1.00)
GFR calc Af Amer: 43 mL/min — ABNORMAL LOW (ref >60)
GFR calc non Af Amer: 37 mL/min — ABNORMAL LOW (ref >60)
Glucose, Bld: 107 mg/dL — ABNORMAL HIGH (ref 70–99)
Potassium: 2.5 mmol/L — CL (ref 3.5–5.1)
Sodium: 138 mmol/L (ref 135–145)

## 2019-04-06 LAB — TROPONIN I (HIGH SENSITIVITY)
Troponin I (High Sensitivity): 5 ng/L (ref <18)
Troponin I (High Sensitivity): 5 ng/L (ref <18)

## 2019-04-06 LAB — SARS CORONAVIRUS 2 BY RT PCR (HOSPITAL ORDER, PERFORMED IN ~~LOC~~ HOSPITAL LAB): SARS Coronavirus 2: NEGATIVE

## 2019-04-06 MED ORDER — PANTOPRAZOLE SODIUM 40 MG PO TBEC
40.0000 mg | DELAYED_RELEASE_TABLET | Freq: Every day | ORAL | Status: DC
  Administered 2019-04-07 – 2019-04-09 (×3): 40 mg via ORAL
  Filled 2019-04-06 (×3): qty 1

## 2019-04-06 MED ORDER — GABAPENTIN 100 MG PO CAPS
100.0000 mg | ORAL_CAPSULE | Freq: Two times a day (BID) | ORAL | Status: DC
  Administered 2019-04-06 – 2019-04-09 (×6): 100 mg via ORAL
  Filled 2019-04-06 (×6): qty 1

## 2019-04-06 MED ORDER — ONDANSETRON HCL 4 MG PO TABS: 4.0000 mg | ORAL_TABLET | Freq: Four times a day (QID) | ORAL | Status: DC | PRN

## 2019-04-06 MED ORDER — POTASSIUM CHLORIDE 10 MEQ/100ML IV SOLN
10.0000 meq | Freq: Once | INTRAVENOUS | Status: AC
  Administered 2019-04-06: 10 meq via INTRAVENOUS
  Filled 2019-04-06: qty 100

## 2019-04-06 MED ORDER — MAGNESIUM SULFATE 2 GM/50ML IV SOLN
2.0000 g | Freq: Once | INTRAVENOUS | Status: AC
  Administered 2019-04-06: 2 g via INTRAVENOUS
  Filled 2019-04-06: qty 50

## 2019-04-06 MED ORDER — BENZTROPINE MESYLATE 1 MG PO TABS
1.0000 mg | ORAL_TABLET | Freq: Three times a day (TID) | ORAL | Status: DC
  Administered 2019-04-06 – 2019-04-09 (×8): 1 mg via ORAL
  Filled 2019-04-06 (×11): qty 1

## 2019-04-06 MED ORDER — POTASSIUM CHLORIDE 10 MEQ/100ML IV SOLN
10.0000 meq | INTRAVENOUS | Status: AC
  Administered 2019-04-06 (×4): 10 meq via INTRAVENOUS
  Filled 2019-04-06 (×4): qty 100

## 2019-04-06 MED ORDER — BIOTENE DRY MOUTH MOISTURIZING MT SOLN: 1.0000 "application " | Freq: Two times a day (BID) | OROMUCOSAL | Status: DC

## 2019-04-06 MED ORDER — BIOTENE DRY MOUTH GENTLE DT PSTE: 1.0000 "application " | PASTE | Freq: Two times a day (BID) | DENTAL | Status: DC

## 2019-04-06 MED ORDER — ASPIRIN EC 81 MG PO TBEC
81.0000 mg | DELAYED_RELEASE_TABLET | Freq: Every day | ORAL | Status: DC
  Administered 2019-04-07 – 2019-04-09 (×3): 81 mg via ORAL
  Filled 2019-04-06 (×3): qty 1

## 2019-04-06 MED ORDER — POTASSIUM CHLORIDE IN NACL 20-0.9 MEQ/L-% IV SOLN
INTRAVENOUS | Status: AC
  Administered 2019-04-06 – 2019-04-07 (×2): via INTRAVENOUS
  Filled 2019-04-06 (×3): qty 1000

## 2019-04-06 MED ORDER — VITAMIN C 500 MG PO TABS
500.0000 mg | ORAL_TABLET | Freq: Every day | ORAL | Status: DC
  Administered 2019-04-07 – 2019-04-09 (×3): 500 mg via ORAL
  Filled 2019-04-06 (×3): qty 1

## 2019-04-06 MED ORDER — LEVETIRACETAM 500 MG PO TABS
500.0000 mg | ORAL_TABLET | Freq: Two times a day (BID) | ORAL | Status: DC
  Administered 2019-04-06 – 2019-04-09 (×6): 500 mg via ORAL
  Filled 2019-04-06 (×7): qty 1

## 2019-04-06 MED ORDER — ENOXAPARIN SODIUM 30 MG/0.3ML ~~LOC~~ SOLN
30.0000 mg | SUBCUTANEOUS | Status: DC
  Administered 2019-04-06: 19:00:00 30 mg via SUBCUTANEOUS
  Filled 2019-04-06: qty 0.3

## 2019-04-06 MED ORDER — TRAZODONE HCL 50 MG PO TABS
50.0000 mg | ORAL_TABLET | Freq: Every day | ORAL | Status: DC
  Administered 2019-04-06 – 2019-04-08 (×3): 50 mg via ORAL
  Filled 2019-04-06 (×3): qty 1

## 2019-04-06 MED ORDER — SODIUM CHLORIDE 0.9 % IV BOLUS
500.0000 mL | Freq: Once | INTRAVENOUS | Status: AC
  Administered 2019-04-06: 500 mL via INTRAVENOUS

## 2019-04-06 MED ORDER — ONDANSETRON HCL 4 MG/2ML IJ SOLN: 4.0000 mg | Freq: Four times a day (QID) | INTRAMUSCULAR | Status: DC | PRN

## 2019-04-06 MED ORDER — DOCUSATE SODIUM 100 MG PO CAPS: 100.0000 mg | ORAL_CAPSULE | Freq: Every day | ORAL | Status: DC | PRN

## 2019-04-06 MED ORDER — ATORVASTATIN CALCIUM 20 MG PO TABS
40.0000 mg | ORAL_TABLET | Freq: Every day | ORAL | Status: DC
  Administered 2019-04-06 – 2019-04-08 (×3): 40 mg via ORAL
  Filled 2019-04-06 (×3): qty 2

## 2019-04-06 MED ORDER — SODIUM CHLORIDE 0.9% FLUSH: 3.0000 mL | Freq: Once | INTRAVENOUS | Status: DC

## 2019-04-06 MED ORDER — ESCITALOPRAM OXALATE 10 MG PO TABS
10.0000 mg | ORAL_TABLET | Freq: Every day | ORAL | Status: DC
  Administered 2019-04-07 – 2019-04-09 (×3): 10 mg via ORAL
  Filled 2019-04-06 (×4): qty 1

## 2019-04-06 NOTE — ED Notes (Signed)
ED TO INPATIENT HANDOFF REPORT  ED Nurse Name and Phone #: Metta Clines [phone]  S Name/Age/Gender Lauren [name] 69 y.o. female Room/Bed: ED17A/ED17A  Code Status   Code Status: Prior  Home/SNF/Other Home Patient oriented to: self, place, time and situation Is this baseline? Yes   Triage Complete: Triage complete  Chief Complaint sob;decreased PO intake  Triage Note Arrives from [facility] for ED evaluation of SOB.  Per report from office staff, patient's oxygen saturations were 68% in office.  Patient is AAOx3.  No SOB.  States felt SOB yesterday as well and symptoms were relieved after putting CPAP on.   Allergies Allergies  Allergen Reactions  . Percocet [Oxycodone-Acetaminophen] Diarrhea, Nausea And Vomiting and Nausea Only  . Tramadol Hcl Diarrhea, Nausea And Vomiting and Nausea Only  . Vicodin [Hydrocodone-Acetaminophen] Diarrhea, Nausea And Vomiting and Nausea Only    Level of Care/Admitting Diagnosis ED Disposition    ED Disposition Condition [place] [facility] Area: [place] [[id number]]  Level of Care: Med-Surg [16]  Covid Evaluation: Person Under Investigation (PUI)  Diagnosis: AKI (acute kidney injury) [facility]) [[id number]]  Admitting Physician: Nicholes Mango [[id number]]  Attending Physician: Nicholes Mango [[id number]]  Estimated length of stay: past midnight tomorrow  Certification:: I certify this patient will need inpatient services for at least 2 midnights  PT Class (Do Not Modify): Inpatient [101]  PT Acc Code (Do Not Modify): Private [1]       B Medical/Surgery History Past Medical History:  Diagnosis Date  . Anxiety   . Apnea, sleep 02/02/2014  . Awareness of heartbeats 02/02/2014  . Breathlessness on exertion 02/02/2014  . Diabetes mellitus   . Encounter for pre-employment examination 06/29/2015  . Excessive sweating 07/05/2015  . Fibromyalgia   . GERD (gastroesophageal reflux disease)   . Gravida 1 10/26/2015   1.    .  Heart murmur   . Herniated disc   . Hyperlipidemia   . Hypertension   . Itch of skin 10/26/2015  . Lack of bladder control   . Lung tumor   . Rheumatoid arthritis ([place])   . Schizoaffective disorder ([place])   . Screening for cervical cancer 07/29/2017  . Seizure [facility])    after brain surgery 2012. last seizure 2013!  [name] Sex counseling 10/26/2015  . Sleep apnea   . 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 Stiffness of both knees 06/22/2015  . Stroke [facility])    Past Surgical History:  Procedure Laterality Date  . BRAIN TUMOR EXCISION  2012   benign  . CARDIAC CATHETERIZATION  2008  . CESAREAN SECTION    . CYST REMOVAL HAND    . LUNG LOBECTOMY  1977   benign tumor  . TOTAL KNEE ARTHROPLASTY Left 01/28/2017   Procedure: TOTAL KNEE ARTHROPLASTY;  Surgeon: Corky Mull, MD;  Location: [facility] ORS;  Service: Orthopedics;  Laterality: Left;     A IV Location/Drains/Wounds Patient Lines/Drains/Airways Status   Active Line/Drains/Airways    Name:   Placement date:   Placement time:   Site:   Days:   Peripheral IV 03/04/19 Left Antecubital   03/04/19    1534    Antecubital   33   Peripheral IV 04/06/19 Right Wrist   04/06/19    1428    Wrist   less than 1          Intake/Output Last 24 hours No intake or output data in the 24 hours ending  04/06/19 1616  Labs/Imaging Results for orders placed or performed during the hospital encounter of 04/06/19 (from the past 48 hour(s))  Basic metabolic panel     Status: Abnormal   Collection Time: 04/06/19  2:36 PM  Result Value Ref Range   Sodium 138 135 - 145 mmol/L   Potassium 2.5 (LL) 3.5 - 5.1 mmol/L    Comment: CRITICAL RESULT CALLED TO, READ BACK BY AND VERIFIED WITH GEORGIE Lonia Roane 04/06/19 1515 KLW    Chloride 103 98 - 111 mmol/L   CO2 20 (L) 22 - 32 mmol/L   Glucose, Bld 107 (H) 70 - 99 mg/dL   BUN 23 8 - 23 mg/dL   Creatinine, Ser 1.46 (H) 0.44 - 1.00 mg/dL   Calcium 9.3 8.9 - 10.3 mg/dL   GFR calc non Af  Amer 37 (L) >60 mL/min   GFR calc Af Amer 43 (L) >60 mL/min   Anion gap 15 5 - 15    Comment: Performed at [facility], [address] [place], [place] [address]  CBC     Status: Abnormal   Collection Time: 04/06/19  2:36 PM  Result Value Ref Range   WBC 8.7 4.0 - 10.5 K/uL   RBC 3.50 (L) 3.87 - 5.11 MIL/uL   Hemoglobin 11.2 (L) 12.0 - 15.0 g/dL   HCT 32.5 (L) 36.0 - 46.0 %   MCV 92.9 80.0 - 100.0 fL   MCH 32.0 26.0 - 34.0 pg   MCHC 34.5 30.0 - 36.0 g/dL   RDW 14.2 11.5 - 15.5 %   Platelets 270 150 - 400 K/uL   nRBC 0.0 0.0 - 0.2 %    Comment: Performed at [facility], [place], [place] [address]  Troponin I (High Sensitivity)     Status: None   Collection Time: 04/06/19  2:36 PM  Result Value Ref Range   Troponin I (High Sensitivity) 5 <18 ng/L    Comment: (NOTE) Elevated high sensitivity troponin I (hsTnI) values and significant  changes across serial measurements may suggest ACS but many other  chronic and acute conditions are known to elevate hsTnI results.  Refer to the "Links" section for chest pain algorithms and additional  guidance. Performed at [facility], [address] [place], [place] [address]   Magnesium     Status: Abnormal   Collection Time: 04/06/19  2:36 PM  Result Value Ref Range   Magnesium 1.4 (L) 1.7 - 2.4 mg/dL    Comment: Performed at [facility], [address] [place], [place] [address]   Dg Chest 2 View  Result Date: 04/06/2019 CLINICAL DATA:  Shortness of breath and hypotension EXAM: CHEST - 2 VIEW COMPARISON:  March 04, 2019 FINDINGS: There is no edema or consolidation. Postoperative changes noted on the left, stable. Heart upper normal in size with pulmonary vascularity normal. No adenopathy. There is degenerative change in the thoracic spine. Stable postoperative rib defect left posterolateral fourth rib. IMPRESSION: Postoperative change on the left. No edema or consolidation. Stable  cardiac silhouette. Electronically Signed   By: Lowella Grip III M.D.   On: 04/06/2019 15:10    Pending Labs Unresulted Labs (From admission, onward)    Start     Ordered   04/06/19 1550  Urinalysis, Routine w reflex microscopic  Once,   STAT     04/06/19 1549   04/06/19 1536  SARS Coronavirus 2 (CEPHEID - Performed in [place] hospital lab), [facility] Order  (Asymptomatic  Patients Labs)  Once,   STAT    Question:  Rule Out  Answer:  Yes   04/06/19 1535          Vitals/Pain Today's Vitals   04/06/19 1541 04/06/19 1542 04/06/19 1543 04/06/19 1544  BP:      Pulse:      Resp: (!) 29 (!) 29 17 20   Temp:      TempSrc:      SpO2: 100% 100% 100% 99%  Weight:      PainSc:        Isolation Precautions No active isolations  Medications Medications  sodium chloride flush (NS) 0.9 % injection 3 mL (has no administration in time range)  sodium chloride 0.9 % bolus 500 mL (has no administration in time range)  potassium chloride 10 mEq in 100 mL IVPB (has no administration in time range)  magnesium sulfate IVPB 2 g 50 mL (has no administration in time range)    Mobility walks with device Low fall risk   Focused Assessments Pulmonary Assessment Handoff:  Lung sounds: Bilateral Breath Sounds: Clear O2 Device: Room Air        R Recommendations: See Admitting Provider Note  Report given to:   Additional Notes:  Tachypnea, clear lung sounds, low potassium/magnesium.

## 2019-04-06 NOTE — Progress Notes (Signed)
Family Meeting Note  Advance Directive:yes  Today a meeting took place with the Patient.    The following clinical team members were present during this meeting:MD  The following were discussed:Patient's diagnosis: Acute kidney injury, dyspnea, diabetes mellitus, hypertension hyperlipidemia, GERD and schizophrenia with a low potassium and magnesium is getting admitted to the hospital.  Treatment plan of care discussed with the patient.  She verbalized understanding of the plan.  , Patient's progosis: Unable to determine and Goals for treatment: Full Code  Son Allena Katz is the healthcare power of attorney  Additional follow-up to be provided: Hospitalist, pulmonology   Time spent during discussion:17 min  Nicholes Mango, MD

## 2019-04-06 NOTE — ED Notes (Signed)
Urine sample sent to lab

## 2019-04-06 NOTE — ED Notes (Addendum)
Per Act team member Santiago Glad, RN, pt does not have legal guardian as she "refused one." Per Santiago Glad, pt has had dec intake/output/self-care/ADLs and has not been using CPAP at night regularly causing pt to go into episodes of "resp distress." States pt also takes monthly shot of haldol for history of schizophrenia. Act/Karen [phone].

## 2019-04-06 NOTE — ED Triage Notes (Signed)
Arrives from [facility] for ED evaluation of SOB.  Per report from office staff, patient's oxygen saturations were 68% in office.  Patient is AAOx3.  No SOB.  States felt SOB yesterday as well and symptoms were relieved after putting CPAP on.

## 2019-04-06 NOTE — H&P (Signed)
[place] [facility] [place] NAME: Lauren [name]    MR#:  [id number]  DATE OF BIRTH:  [birth year]  DATE OF ADMISSION:  04/06/2019  PRIMARY CARE PHYSICIAN: Steele Sizer, MD   REQUESTING/REFERRING PHYSICIAN:   CHIEF COMPLAINT:  Dyspnea  HISTORY OF PRESENT ILLNESS:  Lauren [name]  is a 68 y.o. female with a known history of meningioma parietal status post meningeal resection , diabetes mellitus, hypertension, hyperlipidemia, obstructive sleep apnea uses CPAP nightly and schizophrenia has been seen by neurosurgery for follow-up visit and patient is sent over to the hospital as she is dyspneic and breathing heavily.  Patient is saturating 100% on room air during my examination.  Chest x-ray is negative [place] test is negative.  Renal insufficiency is noticed urinalysis is pending.  Hospitalist team is called admit the patient.  PAST MEDICAL HISTORY:   Past Medical History:  Diagnosis Date  . Anxiety   . Apnea, sleep 02/02/2014  . Awareness of heartbeats 02/02/2014  . Breathlessness on exertion 02/02/2014  . Diabetes mellitus   . Encounter for pre-employment examination 06/29/2015  . Excessive sweating 07/05/2015  . Fibromyalgia   . GERD (gastroesophageal reflux disease)   . Gravida 1 10/26/2015   1.    . Heart murmur   . Herniated disc   . Hyperlipidemia   . Hypertension   . Itch of skin 10/26/2015  . Lack of bladder control   . Lung tumor   . Rheumatoid arthritis ([place])   . Schizoaffective disorder ([place])   . Screening for cervical cancer 07/29/2017  . Seizure [facility])    after brain surgery 2012. last seizure 2013!  [name] Sex counseling 10/26/2015  . Sleep apnea   . 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 Stiffness of both knees 06/22/2015  . Stroke [facility])     PAST SURGICAL HISTOIRY:   Past Surgical History:  Procedure Laterality Date  . BRAIN TUMOR EXCISION  2012   benign  . CARDIAC CATHETERIZATION   2008  . CESAREAN SECTION    . CYST REMOVAL HAND    . LUNG LOBECTOMY  1977   benign tumor  . TOTAL KNEE ARTHROPLASTY Left 01/28/2017   Procedure: TOTAL KNEE ARTHROPLASTY;  Surgeon: Corky Mull, MD;  Location: [facility] ORS;  Service: Orthopedics;  Laterality: Left;    SOCIAL HISTORY:   Social History   Tobacco Use  . Smoking status: Never Smoker  . Smokeless tobacco: Never Used  Substance Use Topics  . Alcohol use: No    Alcohol/week: 0.0 standard drinks    FAMILY HISTORY:   Family History  Problem Relation Age of Onset  . Heart disease Brother   . Depression Mother   . Heart attack Mother   . Stroke Mother   . Alcohol abuse Father   . Stroke Father   . Diabetes Sister   . Diabetes Sister   . Stomach cancer Sister   . Kidney disease Sister   . COPD Brother   . Lung cancer Brother   . Diabetes Brother     DRUG ALLERGIES:   Allergies  Allergen Reactions  . Percocet [Oxycodone-Acetaminophen] Diarrhea, Nausea And Vomiting and Nausea Only  . Tramadol Hcl Diarrhea, Nausea And Vomiting and Nausea Only  . Vicodin [Hydrocodone-Acetaminophen] Diarrhea, Nausea And Vomiting and Nausea Only    REVIEW OF SYSTEMS:  CONSTITUTIONAL: No fever, fatigue or weakness.  EYES: No blurred or double vision.  EARS, NOSE, AND THROAT:  No tinnitus or ear pain.  RESPIRATORY: No cough, reports shortness of breath, denies wheezing or hemoptysis.  CARDIOVASCULAR: No chest pain, orthopnea, edema.  GASTROINTESTINAL: No nausea, vomiting, diarrhea or abdominal pain.  GENITOURINARY: No dysuria, hematuria.  ENDOCRINE: No polyuria, nocturia,  HEMATOLOGY: No anemia, easy bruising or bleeding SKIN: No rash or lesion. MUSCULOSKELETAL: No joint pain or arthritis.   NEUROLOGIC: No tingling, numbness, weakness.  PSYCHIATRY: No anxiety or depression.   MEDICATIONS AT HOME:   Prior to Admission medications   Medication Sig Start Date End Date Taking? Authorizing Provider  Artificial Saliva (BIOTENE DRY  MOUTH MOISTURIZING) SOLN Use as directed 1 application in the mouth or throat 2 (two) times daily. 09/28/18  Yes Hubbard Hartshorn, FNP  Ascorbic Acid 500 MG CHEW Chew 1 tablet by mouth as directed.    Yes [provider]  aspirin EC 81 MG tablet Take 1 tablet (81 mg total) by mouth daily before breakfast. 04/01/18  Yes Sowles, Drue Stager, MD  atorvastatin (LIPITOR) 40 MG tablet Take 1 tablet (40 mg total) by mouth daily at 6 PM. 03/01/19  Yes Sowles, Drue Stager, MD  benztropine (COGENTIN) 1 MG tablet Take 1 tablet by mouth 3 (three) times daily. 01/25/19  Yes [provider]  Dentifrices (BIOTENE DRY MOUTH GENTLE) PSTE Place 1 application onto teeth 2 (two) times daily. 09/28/18  Yes Hubbard Hartshorn, FNP  Elastic Bandages & Supports (KNEE COMPRESSION SLEEVE/L/XL) MISC 2 each by Does not apply route as needed. Wear during day; take off at night 05/12/18  Yes Poulose, Bethel Born, NP  escitalopram (LEXAPRO) 10 MG tablet Take 10 mg by mouth daily.    Yes [provider]  gabapentin (NEURONTIN) 100 MG capsule Take 100 mg by mouth 2 (two) times daily.   Yes [provider]  haloperidol decanoate (HALDOL DECANOATE) 100 MG/ML injection  01/13/19  Yes [provider]  hydrochlorothiazide (HYDRODIURIL) 25 MG tablet Take 1 tablet (25 mg total) by mouth daily. 03/01/19  Yes Sowles, Drue Stager, MD  ibuprofen (ADVIL,MOTRIN) 200 MG tablet Take 600 mg by mouth every 8 (eight) hours as needed (for knee pain.).   Yes [provider]  levETIRAcetam (KEPPRA) 500 MG tablet Take 1 tablet by mouth 2 (two) times a day. 01/25/19  Yes [provider]  lisinopril (ZESTRIL) 20 MG tablet Take 1 tablet (20 mg total) by mouth daily. 03/01/19  Yes Sowles, Drue Stager, MD  metFORMIN (GLUCOPHAGE) 500 MG tablet Take 1 tablet (500 mg total) by mouth 2 (two) times daily with a meal. 03/01/19  Yes Sowles, Drue Stager, MD  omeprazole (PRILOSEC) 20 MG capsule Take 1 capsule (20 mg total) by mouth 2 (two) times  daily. 12/04/18  Yes Sowles, Drue Stager, MD  traZODone (DESYREL) 50 MG tablet Take 1 tablet (50 mg total) by mouth at bedtime. 01/12/18  Yes McNew, Tyson Babinski, MD      VITAL SIGNS:  Blood pressure (!) 109/92, pulse 69, temperature 98.1 F (36.7 C), temperature source Oral, resp. rate (!) 23, weight 100.5 kg, SpO2 100 %.  PHYSICAL EXAMINATION:  GENERAL:  68 y.o.-year-old patient lying in the bed with no acute distress.  EYES: Pupils equal, round, reactive to light and accommodation. No scleral icterus. Extraocular muscles intact.  HEENT: Head atraumatic, normocephalic. Oropharynx and nasopharynx clear.  NECK:  Supple, no jugular venous distention. No thyroid enlargement, no tenderness.  LUNGS: Normal breath sounds bilaterally, no wheezing, rales,rhonchi or crepitation. No use of accessory muscles of respiration.  CARDIOVASCULAR: S1, S2 normal.  No murmurs, rubs, or gallops.  ABDOMEN: Soft, nontender, nondistended. Bowel sounds present. EXTREMITIES: No pedal edema, cyanosis, or clubbing.  NEUROLOGIC: Cranial nerves II through XII are intact. Muscle strength 5/5 in all extremities. Sensation intact. Gait not checked.  PSYCHIATRIC: The patient is alert and oriented x 3.  SKIN: No obvious rash, lesion, or ulcer.   LABORATORY PANEL:   CBC Recent Labs  Lab 04/06/19 1436  WBC 8.7  HGB 11.2*  HCT 32.5*  PLT 270   ------------------------------------------------------------------------------------------------------------------  Chemistries  Recent Labs  Lab 04/06/19 1436  NA 138  K 2.5*  CL 103  CO2 20*  GLUCOSE 107*  BUN 23  CREATININE 1.46*  CALCIUM 9.3  MG 1.4*   ------------------------------------------------------------------------------------------------------------------  Cardiac Enzymes No results for input(s): TROPONINI in the last 168 hours. ------------------------------------------------------------------------------------------------------------------  RADIOLOGY:  Dg  Chest 2 View  Result Date: 04/06/2019 CLINICAL DATA:  Shortness of breath and hypotension EXAM: CHEST - 2 VIEW COMPARISON:  March 04, 2019 FINDINGS: There is no edema or consolidation. Postoperative changes noted on the left, stable. Heart upper normal in size with pulmonary vascularity normal. No adenopathy. There is degenerative change in the thoracic spine. Stable postoperative rib defect left posterolateral fourth rib. IMPRESSION: Postoperative change on the left. No edema or consolidation. Stable cardiac silhouette. Electronically Signed   By: Lowella Grip III M.D.   On: 04/06/2019 15:10    EKG:   Orders placed or performed during the hospital encounter of 04/06/19  . EKG 12-Lead  . EKG 12-Lead  . ED EKG  . ED EKG    IMPRESSION AND PLAN:    #Acute kidney injury Admit to MedSurg unit Hydrate with IV fluids Avoid nephrotoxins, monitor renal function closely Holding metformin, ACE inhibitor, hydrochlorothiazide and NSAIDs at home medications Urinalysis pending  #Dyspnea-seems to be psychogenic with history of schizophrenia Patient is saturating 100% on room air, still breathing heavily for the past 3 months-etiology unclear Routine pulmonology consult placed  #Hypokalemia and hypomagnesemia replete and recheck in a.m.  #Obstructive sleep apnea continue CPAP nightly  #Hypertension-holding hydrochlorothiazide and lisinopril at this point of time, blood pressure is soft continue close monitoring  #Diabetes mellitus-hold metformin, provide carb modified diet and sliding scale insulin  #History of schizophrenia-resume her home medications  DVT prophylaxis with Lovenox renal dose adjusted  All the records are reviewed and case discussed with ED provider. Management plans discussed with the patient, she is  in agreement.  CODE STATUS: fc  TOTAL TIME TAKING CARE OF THIS PATIENT: 45  minutes.   Note: This dictation was prepared with Dragon dictation along with smaller  phrase technology. Any transcriptional errors that result from this process are unintentional.  Nicholes Mango M.D on 04/06/2019 at 5:38 PM  Between 7am to 6pm - Pager - [phone]  After 6pm go to [email or web address] - password [email or web address] [facility]  [place] [facility]  Office  [phone]  CC: Primary care physician; Steele Sizer, MD

## 2019-04-06 NOTE — ED Notes (Signed)
Pt still cannot provide urine sample. Pt understands that if after the 500cc bolus she still cannot provide sample an I&O cath will need to be completed.

## 2019-04-06 NOTE — ED Provider Notes (Signed)
[facility] Emergency Department Provider Note  ____________________________________________   First MD Initiated Contact with Patient 04/06/19 1441     (approximate)  I have reviewed the triage vital signs and the nursing notes.   HISTORY  Chief Complaint Shortness of Breath    HPI Lauren [name] is a 68 y.o. female diabetes, HTN, sleep apnea who presents with SOB.  Pt endorses SOB since jan.  Used CPAP previously at nighttime but has not been recently. The SOB is intermittent, worse with being upset and exercise  Denies any chest pain. Endorses occasional cough. No smoking history.    Pt was in [facility] neurology and noted to be sating 68% and sent here.  Here patient is satting 100% on room air.  However she does have increased work of breathing.     Past Medical History:  Diagnosis Date  . Anxiety   . Apnea, sleep 02/02/2014  . Awareness of heartbeats 02/02/2014  . Breathlessness on exertion 02/02/2014  . Diabetes mellitus   . Encounter for pre-employment examination 06/29/2015  . Excessive sweating 07/05/2015  . Fibromyalgia   . GERD (gastroesophageal reflux disease)   . Gravida 1 10/26/2015   1.    . Heart murmur   . Herniated disc   . Hyperlipidemia   . Hypertension   . Itch of skin 10/26/2015  . Lack of bladder control   . Lung tumor   . Rheumatoid arthritis ([place])   . Schizoaffective disorder ([place])   . Screening for cervical cancer 07/29/2017  . Seizure [facility])    after brain surgery 2012. last seizure 2013!  [name] Sex counseling 10/26/2015  . Sleep apnea   . 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 Stiffness of both knees 06/22/2015  . Stroke [facility])     Patient Active Problem List   Diagnosis Date Noted  . Slurred speech 03/04/2019  . Chest pain in adult 02/15/2019  . Acute respiratory failure ([place]) 02/15/2019  . Seizure-like activity ([place]) 12/29/2018  . Seizures ([place]) 11/24/2018  . Paresthesias  11/22/2018  . Dry mouth 10/12/2018  . Hx of resection of meningioma 07/21/2018  . Tremor 07/21/2018  . Schizoaffective disorder ([place]) 12/29/2017  . Morbid obesity ([place]) 12/09/2017  . Meningioma ([place]) 11/25/2017  . Atherosclerosis of aorta ([place]) 11/25/2017  . Calcification of coronary artery 11/25/2017  . Schizophrenia ([place]) 11/04/2017  . Acid reflux 06/29/2015  . Diabetes mellitus type 2, controlled, without complications ([place]) 69/48/5462  . Cardiac murmur 06/29/2015  . Arthritis of knee, degenerative 06/28/2015  . Insomnia w/ sleep apnea 03/21/2015  . Anxiety disorder 03/21/2015  . Hypertension 03/21/2015  . HLD (hyperlipidemia) 03/21/2015  . Urge incontinence 03/21/2015    Past Surgical History:  Procedure Laterality Date  . BRAIN TUMOR EXCISION  2012   benign  . CARDIAC CATHETERIZATION  2008  . CESAREAN SECTION    . CYST REMOVAL HAND    . LUNG LOBECTOMY  1977   benign tumor  . TOTAL KNEE ARTHROPLASTY Left 01/28/2017   Procedure: TOTAL KNEE ARTHROPLASTY;  Surgeon: Corky Mull, MD;  Location: [facility] ORS;  Service: Orthopedics;  Laterality: Left;    Prior to Admission medications   Medication Sig Start Date End Date Taking? Authorizing Provider  Artificial Saliva (BIOTENE DRY MOUTH MOISTURIZING) SOLN Use as directed 1 application in the mouth or throat 2 (two) times daily. 09/28/18   Hubbard Hartshorn, FNP  Ascorbic Acid 500 MG CHEW Chew 1 tablet by  mouth daily.    [provider]  aspirin EC 81 MG tablet Take 1 tablet (81 mg total) by mouth daily before breakfast. 04/01/18   Ancil Boozer, Drue Stager, MD  atorvastatin (LIPITOR) 40 MG tablet Take 1 tablet (40 mg total) by mouth daily at 6 PM. 03/01/19   Ancil Boozer, Drue Stager, MD  benztropine (COGENTIN) 1 MG tablet Take 1 tablet by mouth 3 (three) times daily. 01/25/19   [provider]  Dentifrices (BIOTENE DRY MOUTH GENTLE) PSTE Place 1 application onto teeth 2 (two) times daily. 09/28/18   Hubbard Hartshorn, FNP  Elastic Bandages &  Supports (KNEE COMPRESSION SLEEVE/L/XL) MISC 2 each by Does not apply route as needed. Wear during day; take off at night 05/12/18   Poulose, Bethel Born, NP  escitalopram (LEXAPRO) 10 MG tablet Take 10 mg by mouth daily.     [provider]  gabapentin (NEURONTIN) 100 MG capsule Take 100 mg by mouth 2 (two) times daily.    [provider]  haloperidol decanoate (HALDOL DECANOATE) 100 MG/ML injection  01/13/19   [provider]  hydrochlorothiazide (HYDRODIURIL) 25 MG tablet Take 1 tablet (25 mg total) by mouth daily. 03/01/19   Steele Sizer, MD  ibuprofen (ADVIL,MOTRIN) 200 MG tablet Take 600 mg by mouth every 8 (eight) hours as needed (for knee pain.).    [provider]  levETIRAcetam (KEPPRA) 500 MG tablet Take 1 tablet by mouth 2 (two) times a day. 01/25/19   [provider]  lisinopril (ZESTRIL) 20 MG tablet Take 1 tablet (20 mg total) by mouth daily. 03/01/19   Steele Sizer, MD  metFORMIN (GLUCOPHAGE) 500 MG tablet Take 1 tablet (500 mg total) by mouth 2 (two) times daily with a meal. 03/01/19   Ancil Boozer, Drue Stager, MD  omeprazole (PRILOSEC) 20 MG capsule Take 1 capsule (20 mg total) by mouth 2 (two) times daily. 12/04/18   Steele Sizer, MD  traZODone (DESYREL) 50 MG tablet Take 1 tablet (50 mg total) by mouth at bedtime. 01/12/18   McNew, Tyson Babinski, MD    Allergies Percocet [oxycodone-acetaminophen], Tramadol hcl, and Vicodin [hydrocodone-acetaminophen]  Family History  Problem Relation Age of Onset  . Heart disease Brother   . Depression Mother   . Heart attack Mother   . Stroke Mother   . Alcohol abuse Father   . Stroke Father   . Diabetes Sister   . Diabetes Sister   . Stomach cancer Sister   . Kidney disease Sister   . COPD Brother   . Lung cancer Brother   . Diabetes Brother     Social History Social History   Tobacco Use  . Smoking status: Never Smoker  . Smokeless tobacco: Never Used  Substance Use Topics  . Alcohol use: No     Alcohol/week: 0.0 standard drinks  . Drug use: No      Review of Systems Constitutional: No fever/chills Eyes: No visual changes. ENT: No sore throat. Cardiovascular: No chest pain Respiratory: Positive for SOB Gastrointestinal: No abdominal pain.  No nausea, no vomiting.  No diarrhea.  No constipation. Genitourinary: Negative for dysuria. Musculoskeletal: Negative for back pain. Skin: Negative for rash. Neurological: Negative for headaches, focal weakness or numbness. All other ROS negative ____________________________________________   PHYSICAL EXAM:  VITAL SIGNS: ED Triage Vitals  Enc Vitals Group     BP 04/06/19 1420 (!) 85/57     Pulse Rate 04/06/19 1420 84     Resp 04/06/19 1420 20     Temp  04/06/19 1420 98.1 F (36.7 C)     Temp Source 04/06/19 1420 Oral     SpO2 04/06/19 1420 100 %     Weight 04/06/19 1424 221 lb 9 oz (100.5 kg)     Height --      Head Circumference --      Peak Flow --      Pain Score 04/06/19 1424 0     Pain Loc --      Pain Edu? --      Excl. in [place]? --     Constitutional: Alert and oriented. Well appearing and in no acute distress. Eyes: Conjunctivae are normal. EOMI. Head: Atraumatic. Nose: No congestion/rhinnorhea. Mouth/Throat: Mucous membranes are moist.   Neck: No stridor. Trachea Midline. FROM Cardiovascular: Normal rate, regular rhythm. Grossly normal heart sounds.  Good peripheral circulation. Respiratory: Increased work of breathing however normal breath sounds. Gastrointestinal: Soft and nontender. No distention. No abdominal bruits.  Musculoskeletal: No lower extremity tenderness nor edema.  No joint effusions. Neurologic:  Normal speech and language. No gross focal neurologic deficits are appreciated.  Skin:  Skin is warm, dry and intact. No rash noted. Psychiatric: Mood and affect are normal. Speech and behavior are normal. GU: Deferred   ____________________________________________   LABS (all labs ordered are  listed, but only abnormal results are displayed)  Labs Reviewed  BASIC METABOLIC PANEL - Abnormal; Notable for the following components:      Result Value   Potassium 2.5 (*)    CO2 20 (*)    Glucose, Bld 107 (*)    Creatinine, Ser 1.46 (*)    GFR calc non Af Amer 37 (*)    GFR calc Af Amer 43 (*)    All other components within normal limits  CBC - Abnormal; Notable for the following components:   RBC 3.50 (*)    Hemoglobin 11.2 (*)    HCT 32.5 (*)    All other components within normal limits  MAGNESIUM - Abnormal; Notable for the following components:   Magnesium 1.4 (*)    All other components within normal limits  SARS CORONAVIRUS 2 (HOSPITAL ORDER, [place] LAB)  URINALYSIS, ROUTINE W REFLEX MICROSCOPIC  TROPONIN I (HIGH SENSITIVITY)  TROPONIN I (HIGH SENSITIVITY)   ____________________________________________   ED ECG REPORT I, Vanessa Mellette, the attending physician, personally viewed and interpreted this ECG.  Normal sinus rate of 81, no st elevation, no twi, normal axis.  QTC reading as 555.  ____________________________________________  RADIOLOGY Robert Bellow, personally viewed and evaluated these images (plain radiographs) as part of my medical decision making, as well as reviewing the written report by the radiologist.  ED MD interpretation: Chest x-ray without evidence of pneumonia  Official radiology report(s): Dg Chest 2 View  Result Date: 04/06/2019 CLINICAL DATA:  Shortness of breath and hypotension EXAM: CHEST - 2 VIEW COMPARISON:  March 04, 2019 FINDINGS: There is no edema or consolidation. Postoperative changes noted on the left, stable. Heart upper normal in size with pulmonary vascularity normal. No adenopathy. There is degenerative change in the thoracic spine. Stable postoperative rib defect left posterolateral fourth rib. IMPRESSION: Postoperative change on the left. No edema or consolidation. Stable cardiac silhouette.  Electronically Signed   By: Lowella Grip III M.D.   On: 04/06/2019 15:10    ____________________________________________   PROCEDURES  Procedure(s) performed (including Critical Care):  Procedures   ____________________________________________   INITIAL IMPRESSION / ASSESSMENT AND PLAN / ED  COURSE   Jenna Routzahn was evaluated in Emergency Department on 04/06/2019 for the symptoms described in the history of present illness. She was evaluated in the context of the global COVID-19 pandemic, which necessitated consideration that the patient might be at risk for infection with the SARS-CoV-2 virus that causes COVID-19. Institutional protocols and algorithms that pertain to the evaluation of patients at risk for COVID-19 are in a state of rapid change based on information released by regulatory bodies including the CDC and federal and state organizations. These policies and algorithms were followed during the patient's care in the ED.     Pt presents with SOB. Differential includes: PNA-will get xray to evaluation Anemia-CBC to evaluate ACS- will get trops Arrhythmia-Will get EKG and keep on monitor.  COVID- will get testing per algorithm. PE-lower suspicion given no risk factors.  Chest x-ray is without evidence of pneumonia.  Unclear why patient is working to breathe with normal oxygen levels.  Possibly secondary to body habitus first anxiety.  However patient does have electrolyte abnormalities.  Potassium is 2.5.  Creatinine is elevated from baseline at 1.46.  Magnesium was low at 1.4.  Given patient's electrolyte abnormalities with worsening AKI and prolonged QTC will discuss to the hospital team for admission.  Discussed possible team and they will admit the patient.  I think most likely the low oxygen level that was charted was not accurate given patient's is having 100% here.     ____________________________________________   FINAL CLINICAL IMPRESSION(S) /  ED DIAGNOSES   Final diagnoses:  Hypokalemia  Hypomagnesemia  AKI (acute kidney injury) ([place])     MEDICATIONS GIVEN DURING THIS VISIT:  Medications  sodium chloride flush (NS) 0.9 % injection 3 mL (has no administration in time range)  sodium chloride 0.9 % bolus 500 mL (has no administration in time range)  potassium chloride 10 mEq in 100 mL IVPB (has no administration in time range)  magnesium sulfate IVPB 2 g 50 mL (has no administration in time range)     ED Discharge Orders    None       Note:  This document was prepared using Dragon voice recognition software and may include unintentional dictation errors.   Vanessa [place], MD 04/06/19 [phone]

## 2019-04-06 NOTE — Consult Note (Signed)
PHARMACY CONSULT NOTE - FOLLOW UP  Pharmacy Consult for Electrolyte Monitoring and Replacement   Recent Labs: Potassium (mmol/L)  Date Value  04/06/2019 2.5 (LL)  01/24/2014 3.6   Magnesium (mg/dL)  Date Value  04/06/2019 1.4 (L)   Calcium (mg/dL)  Date Value  04/06/2019 9.3   Calcium, Total (mg/dL)  Date Value  01/24/2014 9.2   Albumin (g/dL)  Date Value  03/04/2019 4.0  06/27/2016 4.1  06/05/2012 3.2 (L)   Sodium (mmol/L)  Date Value  04/06/2019 138  06/27/2016 144  01/24/2014 137     Assessment: Pharmacy consulted to monitor electolytes in 68yo patient who presented to ED with shortness of breath.   Goal of Therapy:  Electrolytes WNL  Plan:  ED Provider order KCl 64mEq IV x 1 dose and Mag Sulfate 2g IV x 1 dose which were both given. Will order 4 additional KCl 34mEq IV infusions to supplement critical level.  Will follow up electrolyte levels with AM labs.  Pearla Dubonnet ,PharmD Clinical Pharmacist 04/06/2019 5:36 PM

## 2019-04-07 ENCOUNTER — Encounter: Payer: Self-pay | Admitting: Radiology

## 2019-04-07 ENCOUNTER — Inpatient Hospital Stay: Payer: Medicare Other

## 2019-04-07 DIAGNOSIS — R29898 Other symptoms and signs involving the musculoskeletal system: Secondary | ICD-10-CM

## 2019-04-07 LAB — COMPREHENSIVE METABOLIC PANEL
ALT: 13 U/L (ref 0–44)
AST: 19 U/L (ref 15–41)
Albumin: 3.8 g/dL (ref 3.5–5.0)
Alkaline Phosphatase: 66 U/L (ref 38–126)
Anion gap: 11 (ref 5–15)
BUN: 19 mg/dL (ref 8–23)
CO2: 21 mmol/L — ABNORMAL LOW (ref 22–32)
Calcium: 8.7 mg/dL — ABNORMAL LOW (ref 8.9–10.3)
Chloride: 109 mmol/L (ref 98–111)
Creatinine, Ser: 0.88 mg/dL (ref 0.44–1.00)
GFR calc Af Amer: >60 mL/min (ref >60)
GFR calc non Af Amer: >60 mL/min (ref >60)
Glucose, Bld: 113 mg/dL — ABNORMAL HIGH (ref 70–99)
Potassium: 2.8 mmol/L — ABNORMAL LOW (ref 3.5–5.1)
Sodium: 141 mmol/L (ref 135–145)
Total Bilirubin: 1.3 mg/dL — ABNORMAL HIGH (ref 0.3–1.2)
Total Protein: 7 g/dL (ref 6.5–8.1)

## 2019-04-07 LAB — CBC
HCT: 33.2 % — ABNORMAL LOW (ref 36.0–46.0)
Hemoglobin: 11.3 g/dL — ABNORMAL LOW (ref 12.0–15.0)
MCH: 32.2 pg (ref 26.0–34.0)
MCHC: 34 g/dL (ref 30.0–36.0)
MCV: 94.6 fL (ref 80.0–100.0)
Platelets: 246 10*3/uL (ref 150–400)
RBC: 3.51 MIL/uL — ABNORMAL LOW (ref 3.87–5.11)
RDW: 14.4 % (ref 11.5–15.5)
WBC: 5.6 10*3/uL (ref 4.0–10.5)
nRBC: 0 % (ref 0.0–0.2)

## 2019-04-07 LAB — TSH: TSH: 0.804 u[IU]/mL (ref 0.350–4.500)

## 2019-04-07 LAB — PHOSPHORUS: Phosphorus: 3.7 mg/dL (ref 2.5–4.6)

## 2019-04-07 LAB — MAGNESIUM: Magnesium: 2.1 mg/dL (ref 1.7–2.4)

## 2019-04-07 MED ORDER — GADOBUTROL 1 MMOL/ML IV SOLN
10.0000 mL | Freq: Once | INTRAVENOUS | Status: AC | PRN
  Administered 2019-04-07: 10 mL via INTRAVENOUS

## 2019-04-07 MED ORDER — POTASSIUM CHLORIDE IN NACL 20-0.9 MEQ/L-% IV SOLN
INTRAVENOUS | Status: AC
  Administered 2019-04-08: 06:00:00 via INTRAVENOUS
  Filled 2019-04-07 (×3): qty 1000

## 2019-04-07 MED ORDER — ENOXAPARIN SODIUM 40 MG/0.4ML ~~LOC~~ SOLN
40.0000 mg | Freq: Two times a day (BID) | SUBCUTANEOUS | Status: DC
  Administered 2019-04-07 – 2019-04-09 (×5): 40 mg via SUBCUTANEOUS
  Filled 2019-04-07 (×5): qty 0.4

## 2019-04-07 MED ORDER — POTASSIUM CHLORIDE CRYS ER 20 MEQ PO TBCR
40.0000 meq | EXTENDED_RELEASE_TABLET | ORAL | Status: AC
  Administered 2019-04-07 (×2): 40 meq via ORAL
  Filled 2019-04-07: qty 2

## 2019-04-07 NOTE — Plan of Care (Signed)

## 2019-04-07 NOTE — Progress Notes (Addendum)
Ch visited with pt to complete AD. Pt shared that she has asked family to see who would agree to be a HPOA. Pt shared that if her health declines, she would want her sister [name] to be contacted first. Ch went over [place] with pt. Pt shared that she does not want to go back to [facility] after being d/c but is aware that she may not be a good candidate for a group home or living alone at this time. Pt gets frustrated when she is not able to remember. Ch talked pt through that struggle and helped her to reframed her ability to remember. Pt shared that her only child (son) also has struggles with anxiety and does not handle money well. This provided clarity as to why the pt was not able to name him as the next of kin or HPOA. Ch prayed with pt for her health to be restored and to have a sense of peace with her current limitations.     04/07/19 1900  Clinical Encounter Type  Visited With Patient  Visit Type Psychological support;Spiritual support;Social support (AD education )  Referral From Physician  Consult/Referral To Chaplain  Spiritual Encounters  Spiritual Needs Emotional;Grief support  Stress Factors  Patient Stress Factors Exhausted;Health changes;Major life changes;Loss of control  Family Stress Factors Family relationships  Advance Directives (For Healthcare)  Does Patient Have a Medical Advance Directive? No  Does patient want to make changes to medical advance directive? No - Guardian declined
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
## 2019-04-07 NOTE — TOC Initial Note (Signed)
Transition of Care [facility]) - Initial/Assessment Note    Patient Details  Name: Lauren [name] MRN: [id number] Date of Birth: 1951-04-04  Transition of Care [facility]) CM/SW Contact:    Vamsi Apfel, Lenice Llamas Phone Number: [phone]  04/07/2019, 3:05 PM  Clinical Narrative: Clinical Social Worker (CSW) received consult for SNF placement. CSW asked MD to order PT to determine if patient is appropriate for SNF. Per chart review patient has an open Adult Scientist, forensic ([name]) case in [place]. CSW contacted [name] worker Milus Glazier [phone] to get additional information. Per Magda Paganini [name] is following patient because her son moved out of the house and she could not take care of herself. Per Magda Paganini patient was recently at [facility] and did very well. Patient discharged from [facility] last week and is now home with [facility]. Per Magda Paganini patient was doing well at home until she went to a neurologist appointment yesterday and started having trouble breathing. Per Magda Paganini the neurologist sent patient to [facility]. Magda Paganini prefers for patient to go back into rehab and after rehab be placed in a family care home. Per Magda Paganini patient does have a c-pap machine at home. Per [facility] admissions coordinator at [facility] they tried to place patient in a group home after rehab however patient refused it.  CSW met with patient alone at bedside and made her aware that [name] worker Magda Paganini would like for her to go to rehab. CSW explained to patient and Magda Paganini that PT will have to evaluate patient and make a recommendation. CSW also explained that [facility] will have to approve SNF. Patient verbalized her understanding and is agreeable to SNF search in [place]. Patient's sister [name] called patient while CSW was in the room and patient requested that CSW speak with her. CSW made [facility] aware of above. FL2 complete and faxed out. CSW will continue to follow and assist as needed.          Expected Discharge Plan: [place] Barriers to Discharge: Continued Medical Work up   Patient Goals and CMS Choice     Choice offered to / list presented to : Patient  Expected Discharge Plan and Services Expected Discharge Plan: [place] In-house Referral: Clinical Social Work   Post Acute Care Choice: [place] Living arrangements for the past 2 months: [place] Expected Discharge Date: 04/08/19                                    Prior Living Arrangements/Services Living arrangements for the past 2 months: [facility] Lives with:: Self Patient language and need for interpreter reviewed:: No Do you feel safe going back to the place where you live?: Yes      Need for Family Participation in Patient Care: Yes (Comment) Care giver support system in place?: No (comment) Current home services: Home PT(Patient is open to [facility].) Criminal Activity/Legal Involvement Pertinent to Current Situation/Hospitalization: No - Comment as needed  Activities of Daily Living Home Assistive Devices/Equipment: Cane (specify quad or straight), Walker (specify type) ADL Screening (condition at time of admission) Patient's cognitive ability adequate to safely complete daily activities?: Yes Is the patient deaf or have difficulty hearing?: No Does the patient have difficulty seeing, even when wearing glasses/contacts?: No Does the patient have difficulty concentrating, remembering, or making decisions?: Yes Patient able to express need for assistance  with ADLs?: Yes Does the patient have difficulty dressing or bathing?: Yes Independently performs ADLs?: Yes (appropriate for developmental age) Does the patient have difficulty walking or climbing stairs?: No Weakness of Legs: Both Weakness of Arms/Hands: None  Permission Sought/Granted Permission sought to share information with : Research scientist (medical) granted to share information with : Yes, Verbal Permission Granted              Emotional Assessment Appearance:: Appears stated age   Affect (typically observed): Pleasant, Calm Orientation: : Oriented to Self, Oriented to Place, Oriented to  Time, Oriented to Situation Alcohol / Substance Use: Not Applicable Psych Involvement: No (comment)  Admission diagnosis:  Hypokalemia [E87.6] Hypomagnesemia [E83.42] AKI (acute kidney injury) ([place]) [N17.9] Patient Active Problem List   Diagnosis Date Noted  . AKI (acute kidney injury) ([place]) 04/06/2019  . Slurred speech 03/04/2019  . Chest pain in adult 02/15/2019  . Acute respiratory failure ([place]) 02/15/2019  . Seizure-like activity ([place]) 12/29/2018  . Seizures ([place]) 11/24/2018  . Paresthesias 11/22/2018  . Dry mouth 10/12/2018  . Hx of resection of meningioma 07/21/2018  . Tremor 07/21/2018  . Schizoaffective disorder ([place]) 12/29/2017  . Morbid obesity ([place]) 12/09/2017  . Meningioma ([place]) 11/25/2017  . Atherosclerosis of aorta ([place]) 11/25/2017  . Calcification of coronary artery 11/25/2017  . Schizophrenia ([place]) 11/04/2017  . Acid reflux 06/29/2015  . Diabetes mellitus type 2, controlled, without complications ([place]) 84/66/5993  . Cardiac murmur 06/29/2015  . Arthritis of knee, degenerative 06/28/2015  . Insomnia w/ sleep apnea 03/21/2015  . Anxiety disorder 03/21/2015  . Hypertension 03/21/2015  . HLD (hyperlipidemia) 03/21/2015  . Urge incontinence 03/21/2015   PCP:  Steele Sizer, MD Pharmacy:   [place], [place] - [address] [place] [address] Phone: [phone] Fax: [phone]     Social Determinants of Health ([place]) Interventions    Readmission Risk Interventions Readmission Risk Prevention Plan 02/16/2019  Transportation Screening Complete  PCP or Specialist Appt within 5-7 Days Complete  Home Care Screening Complete  Medication  Review (RN CM) Complete  Some recent data might be hidden

## 2019-04-07 NOTE — Progress Notes (Signed)
Patients fluid orders expired. On call MD notified. Gardiner Barefoot, verbal order to continue fluids. Order placed and fluids continued.

## 2019-04-07 NOTE — Consult Note (Signed)
Reason for Consult: R hand grip weakness   Referring Physician: Dr. Margaretmary Eddy  CC: R hand weakness   HPI: Lauren [name] is an 68 y.o. female  with a known history of meningioma parietal status post meningeal resection , diabetes mellitus, hypertension, hyperlipidemia, obstructive sleep apnea uses CPAP nightly and schizophrenia has been seen by neurosurgery by Dr. Lacinda Axon for L sided meningioma that was initially resected in what pt states 2012.  Pt has been having 3-4 month hx of weakness in R arm grip weakness. [place] MRI brain about a month ago with L meningioma and edema.    Past Medical History:  Diagnosis Date  . Anxiety   . Apnea, sleep 02/02/2014  . Awareness of heartbeats 02/02/2014  . Breathlessness on exertion 02/02/2014  . Diabetes mellitus   . Encounter for pre-employment examination 06/29/2015  . Excessive sweating 07/05/2015  . Fibromyalgia   . GERD (gastroesophageal reflux disease)   . Gravida 1 10/26/2015   1.    . Heart murmur   . Herniated disc   . Hyperlipidemia   . Hypertension   . Itch of skin 10/26/2015  . Lack of bladder control   . Lung tumor   . Rheumatoid arthritis ([place])   . Schizoaffective disorder ([place])   . Screening for cervical cancer 07/29/2017  . Seizure [facility])    after brain surgery 2012. last seizure 2013!  [name] Sex counseling 10/26/2015  . Sleep apnea   . 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 Stiffness of both knees 06/22/2015  . Stroke [facility])     Past Surgical History:  Procedure Laterality Date  . BRAIN TUMOR EXCISION  2012   benign  . CARDIAC CATHETERIZATION  2008  . CESAREAN SECTION    . CYST REMOVAL HAND    . LUNG LOBECTOMY  1977   benign tumor  . TOTAL KNEE ARTHROPLASTY Left 01/28/2017   Procedure: TOTAL KNEE ARTHROPLASTY;  Surgeon: Corky Mull, MD;  Location: [facility] ORS;  Service: Orthopedics;  Laterality: Left;    Family History  Problem Relation Age of Onset  . Heart disease Brother   . Depression  Mother   . Heart attack Mother   . Stroke Mother   . Alcohol abuse Father   . Stroke Father   . Diabetes Sister   . Diabetes Sister   . Stomach cancer Sister   . Kidney disease Sister   . COPD Brother   . Lung cancer Brother   . Diabetes Brother     Social History:  reports that she has never smoked. She has never used smokeless tobacco. She reports that she does not drink alcohol or use drugs.  Allergies  Allergen Reactions  . Percocet [Oxycodone-Acetaminophen] Diarrhea, Nausea And Vomiting and Nausea Only  . Tramadol Hcl Diarrhea, Nausea And Vomiting and Nausea Only  . Vicodin [Hydrocodone-Acetaminophen] Diarrhea, Nausea And Vomiting and Nausea Only    Medications: I have reviewed the patient's current medications.  ROS: History obtained from the patient  General ROS: negative for - chills, fatigue, fever, night sweats, weight gain or weight loss Psychological ROS: shcizophrenia Ophthalmic ROS: negative for - blurry vision, double vision, eye pain or loss of vision ENT ROS: negative for - epistaxis, nasal discharge, oral lesions, sore throat, tinnitus or vertigo Allergy and Immunology ROS: negative for - hives or itchy/watery eyes Hematological and Lymphatic ROS: negative for - bleeding problems, bruising or swollen lymph nodes Endocrine ROS: negative for - galactorrhea, hair pattern  changes, polydipsia/polyuria or temperature intolerance Respiratory TGG:YIRSWNIO SOB Cardiovascular ROS: negative for - chest pain, dyspnea on exertion, edema or irregular heartbeat Gastrointestinal ROS: negative for - abdominal pain, diarrhea, hematemesis, nausea/vomiting or stool incontinence Genito-Urinary ROS: negative for - dysuria, hematuria, incontinence or urinary frequency/urgency Musculoskeletal ROS: negative for - joint swelling or muscular weakness Neurological ROS: as noted in HPI Dermatological ROS: negative for rash and skin lesion changes  Physical Examination: Blood pressure  118/84, pulse 75, temperature 98 F (36.7 C), temperature source Oral, resp. rate 18, height 5\' 2"  (1.575 m), weight 100.5 kg, SpO2 96 %.  Neurological Examination   Mental Status: Alert, oriented. Speech is slow with dysarthria present Cranial Nerves: II: Discs flat bilaterally; Visual fields grossly normal, pupils equal, round, reactive to light and accommodation III,IV, VI: ptosis not present, extra-ocular motions intact bilaterally V,VII: slight R facial droop  VIII: hearing normal bilaterally IX,X: gag reflex present XI: bilateral shoulder shrug XII: midline tongue extension Motor: generalized weakness all over more so in the R grip strength.  Cerebellar: normal finger-to-nose     Laboratory Studies:   Basic Metabolic Panel: Recent Labs  Lab 04/06/19 1436 04/07/19 0459  NA 138 141  K 2.5* 2.8*  CL 103 109  CO2 20* 21*  GLUCOSE 107* 113*  BUN 23 19  CREATININE 1.46* 0.88  CALCIUM 9.3 8.7*  MG 1.4* 2.1  PHOS  --  3.7    Liver Function Tests: Recent Labs  Lab 04/07/19 0459  AST 19  ALT 13  ALKPHOS 66  BILITOT 1.3*  PROT 7.0  ALBUMIN 3.8   No results for input(s): LIPASE, AMYLASE in the last 168 hours. No results for input(s): AMMONIA in the last 168 hours.  CBC: Recent Labs  Lab 04/06/19 1436 04/07/19 0459  WBC 8.7 5.6  HGB 11.2* 11.3*  HCT 32.5* 33.2*  MCV 92.9 94.6  PLT 270 246    Cardiac Enzymes: No results for input(s): CKTOTAL, CKMB, CKMBINDEX, TROPONINI in the last 168 hours.  BNP: Invalid input(s): POCBNP  CBG: No results for input(s): GLUCAP in the last 168 hours.  Microbiology: Results for orders placed or performed during the hospital encounter of 04/06/19  SARS Coronavirus 2 (CEPHEID - Performed in [place] hospital lab), Hosp Order     Status: None   Collection Time: 04/06/19  3:48 PM   Specimen: Nasopharyngeal Swab  Result Value Ref Range Status   SARS Coronavirus 2 NEGATIVE NEGATIVE Final    Comment: (NOTE) If result  is NEGATIVE SARS-CoV-2 target nucleic acids are NOT DETECTED. The SARS-CoV-2 RNA is generally detectable in upper and lower  respiratory specimens during the acute phase of infection. The lowest  concentration of SARS-CoV-2 viral copies this assay can detect is 250  copies / mL. A negative result does not preclude SARS-CoV-2 infection  and should not be used as the sole basis for treatment or other  patient management decisions.  A negative result may occur with  improper specimen collection / handling, submission of specimen other  than nasopharyngeal swab, presence of viral mutation(s) within the  areas targeted by this assay, and inadequate number of viral copies  (<250 copies / mL). A negative result must be combined with clinical  observations, patient history, and epidemiological information. If result is POSITIVE SARS-CoV-2 target nucleic acids are DETECTED. The SARS-CoV-2 RNA is generally detectable in upper and lower  respiratory specimens dur ing the acute phase of infection.  Positive  results are indicative of active infection  with SARS-CoV-2.  Clinical  correlation with patient history and other diagnostic information is  necessary to determine patient infection status.  Positive results do  not rule out bacterial infection or co-infection with other viruses. If result is PRESUMPTIVE POSTIVE SARS-CoV-2 nucleic acids MAY BE PRESENT.   A presumptive positive result was obtained on the submitted specimen  and confirmed on repeat testing.  While 2019 novel coronavirus  (SARS-CoV-2) nucleic acids may be present in the submitted sample  additional confirmatory testing may be necessary for epidemiological  and / or clinical management purposes  to differentiate between  SARS-CoV-2 and other Sarbecovirus currently known to infect humans.  If clinically indicated additional testing with an alternate test  methodology [phone]) is advised. The SARS-CoV-2 RNA is generally   detectable in upper and lower respiratory sp ecimens during the acute  phase of infection. The expected result is Negative. Fact Sheet for Patients:  StrictlyIdeas.no Fact Sheet for Healthcare Providers: BankingDealers.co.za This test is not yet approved or cleared by the [place] FDA and has been authorized for detection and/or diagnosis of SARS-CoV-2 by FDA under an Emergency Use Authorization (EUA).  This EUA will remain in effect (meaning this test can be used) for the duration of the COVID-19 declaration under Section 564(b)(1) of the Act, 21 U.S.C. section 360bbb-3(b)(1), unless the authorization is terminated or revoked sooner. Performed at [facility], [address] [place], [place] [address]     Coagulation Studies: No results for input(s): LABPROT, INR in the last 72 hours.  Urinalysis:  Recent Labs  Lab 04/06/19 1631  COLORURINE AMBER*  LABSPEC 1.025  PHURINE 5.0  GLUCOSEU NEGATIVE  HGBUR NEGATIVE  BILIRUBINUR NEGATIVE  KETONESUR NEGATIVE  PROTEINUR 100*  NITRITE NEGATIVE  LEUKOCYTESUR MODERATE*    Lipid Panel:     Component Value Date/Time   CHOL 114 03/05/2019 0306   CHOL 203 (H) 06/27/2016 0902   TRIG 105 03/05/2019 0306   HDL 30 (L) 03/05/2019 0306   HDL 49 06/27/2016 0902   CHOLHDL 3.8 03/05/2019 0306   VLDL 21 03/05/2019 0306   LDLCALC 63 03/05/2019 0306   LDLCALC 138 (H) 06/27/2016 0902    HgbA1C:  Lab Results  Component Value Date   HGBA1C 6.2 (H) 03/05/2019    Urine Drug Screen:      Component Value Date/Time   LABOPIA NONE DETECTED 12/28/2017 2144   COCAINSCRNUR NONE DETECTED 12/28/2017 2144   LABBENZ NONE DETECTED 12/28/2017 2144   AMPHETMU NONE DETECTED 12/28/2017 2144   THCU NONE DETECTED 12/28/2017 2144   LABBARB NONE DETECTED 12/28/2017 2144    Alcohol Level: No results for input(s): ETH in the last 168 hours.  Other results: EKG: normal EKG, normal sinus  rhythm, unchanged from previous tracings.  Imaging: Dg Chest 2 View  Result Date: 04/06/2019 CLINICAL DATA:  Shortness of breath and hypotension EXAM: CHEST - 2 VIEW COMPARISON:  March 04, 2019 FINDINGS: There is no edema or consolidation. Postoperative changes noted on the left, stable. Heart upper normal in size with pulmonary vascularity normal. No adenopathy. There is degenerative change in the thoracic spine. Stable postoperative rib defect left posterolateral fourth rib. IMPRESSION: Postoperative change on the left. No edema or consolidation. Stable cardiac silhouette. Electronically Signed   By: Lowella Grip III M.D.   On: 04/06/2019 15:10   Mr Angio Neck W Wo Contrast  Result Date: 04/07/2019 CLINICAL DATA:  Mental status changes. Left carotid stenosis by ultrasound. EXAM: MRA NECK WITHOUT AND WITH CONTRAST  TECHNIQUE: Multiplanar and multiecho pulse sequences of the neck were obtained without and with intravenous contrast. Angiographic images of the neck were obtained using MRA technique without and with intravenous contrast. CONTRAST:  10 cc Gadavist COMPARISON:  Ultrasound study 3 days ago. FINDINGS: Branching pattern of the arch is normal. No origin stenosis seen. Both common carotid arteries are widely patent to their respective bifurcation. Both carotid bifurcations appear widely patent without stenosis or irregularity. The cervical internal carotid arteries are tortuous but there is no stenosis. Tortuosity may have given the impression of stenosis. Both internal carotid arteries are patent through the skull base to the circle-of-Willis region. No circle-of-Willis stenosis or occlusion is identified. Both vertebral arteries are patent with the right being dominant. No origin stenosis is suspected. Both vertebral arteries are patent through the cervical region, through the foramen magnum to the basilar. No basilar stenosis. Posterior circulation branch vessels show flow. IMPRESSION: Negative  MR angiography of the neck. Both carotid bifurcations appear widely patent without stenosis or irregularity. The cervical internal carotid arteries are tortuous, which may have given the impression of stenosis at ultrasound. Electronically Signed   By: [name] Chimes M.D.   On: 04/07/2019 13:37     Assessment/Plan: 68 y.o. female  with a known history of meningioma parietal status post meningeal resection , diabetes mellitus, hypertension, hyperlipidemia, obstructive sleep apnea uses CPAP nightly and schizophrenia has been seen by neurosurgery by Dr. Lacinda Axon for L sided meningioma that was initially resected in what pt states 2012.  Pt has been having 3-4 month hx of weakness in R arm grip weakness. [place] MRI brain about a month ago with L meningioma and edema.    - Pt does have worsening grip strength on R side that has been progressive for the past few months but she doesn't appear to want surgical intervention despite MRI showing meningioma with edema about 1 month ago - Slow speech possibly parkinsonian plus related due to dopamine blocking agents including mask like face but not convinced RUE is due to extrapyramidal in nature as its very focal. That is likely more related to meningioma - Since she had MRI a month ago, she should follow up with [facility] and Dr. Lacinda Axon as out patient.   04/07/2019, 2:20 PM

## 2019-04-07 NOTE — NC FL2 (Addendum)
[place] LEVEL OF CARE SCREENING TOOL     IDENTIFICATION  Patient Name: Lauren [name] Birthdate: 01/10/1951 Sex: female Admission Date (Current Location): 04/06/2019  [place] and [place] Number:  Engineering geologist and Address:  [facility], [address] [place], [place] [address]      Provider Number: [id number]  Attending Physician Name and Address:  Henreitta Leber, MD  Relative Name and Phone Number:       Current Level of Care: Hospital Recommended Level of Care: Group Home  Prior Approval Number:    Date Approved/Denied:   PASRR Number: RSVP # [id number]    Discharge Plan: Group Home     Current Diagnoses: Patient Active Problem List   Diagnosis Date Noted  . AKI (acute kidney injury) ([place]) 04/06/2019  . Slurred speech 03/04/2019  . Chest pain in adult 02/15/2019  . Acute respiratory failure ([place]) 02/15/2019  . Seizure-like activity ([place]) 12/29/2018  . Seizures ([place]) 11/24/2018  . Paresthesias 11/22/2018  . Dry mouth 10/12/2018  . Hx of resection of meningioma 07/21/2018  . Tremor 07/21/2018  . Schizoaffective disorder ([place]) 12/29/2017  . Morbid obesity ([place]) 12/09/2017  . Meningioma ([place]) 11/25/2017  . Atherosclerosis of aorta ([place]) 11/25/2017  . Calcification of coronary artery 11/25/2017  . Schizophrenia ([place]) 11/04/2017  . Acid reflux 06/29/2015  . Diabetes mellitus type 2, controlled, without complications ([place]) 14/48/1856  . Cardiac murmur 06/29/2015  . Arthritis of knee, degenerative 06/28/2015  . Insomnia w/ sleep apnea 03/21/2015  . Anxiety disorder 03/21/2015  . Hypertension 03/21/2015  . HLD (hyperlipidemia) 03/21/2015  . Urge incontinence 03/21/2015    Orientation RESPIRATION BLADDER Height & Weight     Self, Time, Situation, Place  Normal, Other (Comment)(C-pap at night.) Continent Weight: 221 lb 9 oz (100.5 kg) Height:  5\' 2"  (157.5 cm)  BEHAVIORAL SYMPTOMS/MOOD NEUROLOGICAL BOWEL  NUTRITION STATUS      Continent Diet(Diet: Heart Healthy/ Carb Modified.)  AMBULATORY STATUS COMMUNICATION OF NEEDS Skin   Limited assistance  Verbally Normal                       Personal Care Assistance Level of Assistance  Bathing, Feeding, Dressing Bathing Assistance: Limited assistance Feeding assistance: Independent Dressing Assistance: Limited assistance     Functional Limitations Info  Sight, Hearing, Speech Sight Info: Adequate Hearing Info: Adequate Speech Info: Adequate    SPECIAL CARE FACTORS FREQUENCY  PT (By licensed PT), OT (By licensed OT)     Home Health PT and OT 2-3 days per week. [facility]              Contractures      Additional Factors Info  Code Status, Allergies Code Status Info: Full Code. Allergies Info: Percocet Oxycodone-acetaminophen,Tramadol Hcl, Vicodin Hydrocodone-acetaminophen          Discharge Medications: Please see discharge summary for a list of discharge medications. Medication List    TAKE these medications   Ascorbic Acid 500 MG Chew Chew 1 tablet by mouth as directed.   aspirin EC 81 MG tablet Take 1 tablet (81 mg total) by mouth daily before breakfast.   atorvastatin 40 MG tablet Commonly known as: LIPITOR Take 1 tablet (40 mg total) by mouth daily at 6 PM.   benztropine 1 MG tablet Commonly known as: COGENTIN Take 1 tablet by mouth 3 (three) times daily.   Biotene Dry Mouth Gentle Pste Place 1 application onto teeth 2 (  two) times daily.   Biotene Dry Mouth Moisturizing Soln Use as directed 1 application in the mouth or throat 2 (two) times daily.   escitalopram 10 MG tablet Commonly known as: LEXAPRO Take 10 mg by mouth daily.   gabapentin 100 MG capsule Commonly known as: NEURONTIN Take 100 mg by mouth 2 (two) times daily.   haloperidol decanoate 100 MG/ML injection Commonly known as: HALDOL DECANOATE   hydrochlorothiazide 25 MG tablet Commonly known as:  HYDRODIURIL Take 1 tablet (25 mg total) by mouth daily.   ibuprofen 200 MG tablet Commonly known as: ADVIL Take 600 mg by mouth every 8 (eight) hours as needed (for knee pain.).   Knee Compression Sleeve/L/XL Misc 2 each by Does not apply route as needed. Wear during day; take off at night   levETIRAcetam 500 MG tablet Commonly known as: KEPPRA Take 1 tablet by mouth 2 (two) times a day.   lisinopril 20 MG tablet Commonly known as: ZESTRIL Take 1 tablet (20 mg total) by mouth daily.   metFORMIN 500 MG tablet Commonly known as: GLUCOPHAGE Take 1 tablet (500 mg total) by mouth 2 (two) times daily with a meal.   omeprazole 20 MG capsule Commonly known as: PRILOSEC Take 1 capsule (20 mg total) by mouth 2 (two) times daily.   potassium chloride SA 20 MEQ tablet Commonly known as: K-DUR Take 1 tablet (20 mEq total) by mouth 2 (two) times daily for 2 days.   traZODone 50 MG tablet Commonly known as: DESYREL Take 1 tablet (50 mg total) by mouth at bedtime.   Relevant Imaging Results: Relevant Lab Results: Additional Information SSN: [id number]  Kessa Fairbairn, [name], LCSW

## 2019-04-07 NOTE — Progress Notes (Signed)
[place] [facility] [place] NAME: Lauren [name]    MR#:  [id number]  DATE OF BIRTH:  09/11/1950  SUBJECTIVE:   Patient presents to the hospital secondary to shortness of breath, and also noted to be hypokalemic.  Patient still having some shortness of breath but is not hypoxic.  REVIEW OF SYSTEMS:    Review of Systems  Constitutional: Negative for chills and fever.  HENT: Negative for congestion and tinnitus.   Eyes: Negative for blurred vision and double vision.  Respiratory: Positive for shortness of breath. Negative for cough and wheezing.   Cardiovascular: Negative for chest pain, orthopnea and PND.  Gastrointestinal: Negative for abdominal pain, diarrhea, nausea and vomiting.  Genitourinary: Negative for dysuria and hematuria.  Neurological: Positive for weakness (Right hand weakness). Negative for dizziness, sensory change and focal weakness.  All other systems reviewed and are negative.   Nutrition: Heart Healthy/Carb control Tolerating Diet: Yes Tolerating PT: Await Eval.    DRUG ALLERGIES:   Allergies  Allergen Reactions  . Percocet [Oxycodone-Acetaminophen] Diarrhea, Nausea And Vomiting and Nausea Only  . Tramadol Hcl Diarrhea, Nausea And Vomiting and Nausea Only  . Vicodin [Hydrocodone-Acetaminophen] Diarrhea, Nausea And Vomiting and Nausea Only    VITALS:  Blood pressure 118/84, pulse 75, temperature 98 F (36.7 C), temperature source Oral, resp. rate 18, height 5\' 2"  (1.575 m), weight 100.5 kg, SpO2 96 %.  PHYSICAL EXAMINATION:   Physical Exam  GENERAL:  68 y.o.-year-old patient lying in bed in no acute distress.  EYES: Pupils equal, round, reactive to light and accommodation. No scleral icterus. Extraocular muscles intact.  HEENT: Head atraumatic, normocephalic. Dry Oral Mucosa.   NECK:  Supple, no jugular venous distention. No thyroid enlargement, no tenderness.  LUNGS: Normal breath sounds bilaterally, no  wheezing, rales, rhonchi. No use of accessory muscles of respiration.  CARDIOVASCULAR: S1, S2 normal. No murmurs, rubs, or gallops.  ABDOMEN: Soft, nontender, nondistended. Bowel sounds present. No organomegaly or mass.  EXTREMITIES: No cyanosis, clubbing or edema b/l.    NEUROLOGIC: Cranial nerves II through XII are intact. No focal Motor or sensory deficits b/l.    PSYCHIATRIC: The patient is alert and oriented x 3.  SKIN: No obvious rash, lesion, or ulcer.    LABORATORY PANEL:   CBC Recent Labs  Lab 04/07/19 0459  WBC 5.6  HGB 11.3*  HCT 33.2*  PLT 246   ------------------------------------------------------------------------------------------------------------------  Chemistries  Recent Labs  Lab 04/07/19 0459  NA 141  K 2.8*  CL 109  CO2 21*  GLUCOSE 113*  BUN 19  CREATININE 0.88  CALCIUM 8.7*  MG 2.1  AST 19  ALT 13  ALKPHOS 66  BILITOT 1.3*   ------------------------------------------------------------------------------------------------------------------  Cardiac Enzymes No results for input(s): TROPONINI in the last 168 hours. ------------------------------------------------------------------------------------------------------------------  RADIOLOGY:  Dg Chest 2 View  Result Date: 04/06/2019 CLINICAL DATA:  Shortness of breath and hypotension EXAM: CHEST - 2 VIEW COMPARISON:  March 04, 2019 FINDINGS: There is no edema or consolidation. Postoperative changes noted on the left, stable. Heart upper normal in size with pulmonary vascularity normal. No adenopathy. There is degenerative change in the thoracic spine. Stable postoperative rib defect left posterolateral fourth rib. IMPRESSION: Postoperative change on the left. No edema or consolidation. Stable cardiac silhouette. Electronically Signed   By: Lowella Grip III M.D.   On: 04/06/2019 15:10   Mr Angio Neck W Wo Contrast  Result Date: 04/07/2019 CLINICAL DATA:  Mental status changes.  Left carotid  stenosis by ultrasound. EXAM: MRA NECK WITHOUT AND WITH CONTRAST TECHNIQUE: Multiplanar and multiecho pulse sequences of the neck were obtained without and with intravenous contrast. Angiographic images of the neck were obtained using MRA technique without and with intravenous contrast. CONTRAST:  10 cc Gadavist COMPARISON:  Ultrasound study 3 days ago. FINDINGS: Branching pattern of the arch is normal. No origin stenosis seen. Both common carotid arteries are widely patent to their respective bifurcation. Both carotid bifurcations appear widely patent without stenosis or irregularity. The cervical internal carotid arteries are tortuous but there is no stenosis. Tortuosity may have given the impression of stenosis. Both internal carotid arteries are patent through the skull base to the circle-of-Willis region. No circle-of-Willis stenosis or occlusion is identified. Both vertebral arteries are patent with the right being dominant. No origin stenosis is suspected. Both vertebral arteries are patent through the cervical region, through the foramen magnum to the basilar. No basilar stenosis. Posterior circulation branch vessels show flow. IMPRESSION: Negative MR angiography of the neck. Both carotid bifurcations appear widely patent without stenosis or irregularity. The cervical internal carotid arteries are tortuous, which may have given the impression of stenosis at ultrasound. Electronically Signed   By: [name] Chimes M.D.   On: 04/07/2019 13:37     ASSESSMENT AND PLAN:   68 year old female with past medical history of fibromyalgia, rheumatoid arthritis, schizoaffective disorder, previous history of seizures, obstructive sleep apnea, hypertension, hyperlipidemia who presented to the hospital due to shortness of breath and noted to be hypokalemic.  1.  Dyspnea/shortness of breath- etiology unclear presently.  Patient is clinically not hypoxic and patient's imaging findings are negative for acute pathology.   -This appears to be more psychogenic in nature and not really pulmonary.  2.  Hypokalemia-we will continue supplement potassium intravenously and orally and repeat level in the morning. -Magnesium level is normal.  3. Acute Renal failure - improved and resolved with IV fluids.  - Cr. Normalized.   4.  History of meningioma-patient is followed by neurosurgery and the recommended surgical intervention but patient has refused.  Patient has some right hand and arm weakness which is probably suspected due to the meningioma.  MRI of the neck obtained today which shows no acute abnormality. - Also seen by neurology and discussed with them and no plans for further intervention.  Continue follow-up with neurosurgery as an outpatient for now. - Await physical therapy evaluation.  5.  History of schizoaffective disorder- continue Lexapro, trazodone.  6.  Neuropathy-continue gabapentin.  7.  Hyperlipidemia-continue atorvastatin.  8.  GERD-continue Protonix.     All the records are reviewed and case discussed with Care Management/Social Worker. Management plans discussed with the patient, family and they are in agreement.  CODE STATUS: Full code  DVT Prophylaxis: Lovenox  TOTAL TIME TAKING CARE OF THIS PATIENT: 30 minutes.   POSSIBLE D/C IN 1-2 DAYS, DEPENDING ON CLINICAL CONDITION.   Henreitta Leber M.D on 04/07/2019 at 2:42 PM  Between 7am to 6pm - Pager - [phone]  After 6pm go to [email or web address] - Technical brewer [place] [facility]  Office  [phone]  CC: Primary care physician; Steele Sizer, MD

## 2019-04-07 NOTE — Progress Notes (Signed)
PHARMACY NOTE:  ANTICOAGULATION RENAL DOSAGE ADJUSTMENT  Current anticoagulation regimen includes a mismatch between anticoagulation dosage and estimated renal function.  As per policy approved by the Pharmacy & Therapeutics and Medical Executive Committees, the anticoagulation dosage will be adjusted accordingly.  Current anticoagulation dosage:  Enoxaparin 30 mg subq daily   Indication: VTE prophylaxis  Renal Function:  Estimated Creatinine Clearance: 69.8 mL/min (by C-G formula based on SCr of 0.88 mg/dL). []      On intermittent HD, scheduled: []      On CRRT    Anticoagulation dosage has been changed to:  Enoxaparin 40 mg subq bid for BMI > 40 and CrCl > 30 ml/min  Additional comments:   Thank you for allowing pharmacy to be a part of this patient's care.  Tobie Lords, PharmD, BCPS Clinical Pharmacist 04/07/2019 6:34 AM
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
## 2019-04-07 NOTE — Consult Note (Signed)
PHARMACY CONSULT NOTE - FOLLOW UP  Pharmacy Consult for Electrolyte Monitoring and Replacement   Recent Labs: Potassium (mmol/L)  Date Value  04/07/2019 2.8 (L)  01/24/2014 3.6   Magnesium (mg/dL)  Date Value  04/07/2019 2.1   Calcium (mg/dL)  Date Value  04/07/2019 8.7 (L)   Calcium, Total (mg/dL)  Date Value  01/24/2014 9.2   Albumin (g/dL)  Date Value  04/07/2019 3.8  06/27/2016 4.1  06/05/2012 3.2 (L)   Phosphorus (mg/dL)  Date Value  04/07/2019 3.7   Sodium (mmol/L)  Date Value  04/07/2019 141  06/27/2016 144  01/24/2014 137     Assessment: Pharmacy consulted to monitor electolytes in 68yo patient who presented to ED with shortness of breath.   Goal of Therapy:  Electrolytes WNL  Plan:  K 2.8. Order for potassium 40 mEq PO x 2 doses. Patient also receiving NS w/ 20 K at 100 mL/hr. At this rate, patient will receive 48 mEq potassium in 24 hours. Total 128 mEq potassium replacement in next 24 hours which is expected to raise potassium to approximately 4.  Mg and phos WNL.  Will follow up electrolyte levels with AM labs.  Tawnya Crook ,PharmD Clinical Pharmacist 04/07/2019 9:36 AM

## 2019-04-08 LAB — ANA COMPREHENSIVE PANEL
Anti JO-1: <0.2 AI (ref 0.0–0.9)
Centromere Ab Screen: <0.2 AI (ref 0.0–0.9)
Chromatin Ab SerPl-aCnc: <0.2 AI (ref 0.0–0.9)
ENA SM Ab Ser-aCnc: <0.2 AI (ref 0.0–0.9)
Ribonucleic Protein: 0.2 AI (ref 0.0–0.9)
SSA (Ro) (ENA) Antibody, IgG: 2.6 AI — ABNORMAL HIGH (ref 0.0–0.9)
SSB (La) (ENA) Antibody, IgG: <0.2 AI (ref 0.0–0.9)
Scleroderma (Scl-70) (ENA) Antibody, IgG: <0.2 AI (ref 0.0–0.9)
ds DNA Ab: <1 IU/mL (ref 0–9)

## 2019-04-08 LAB — BASIC METABOLIC PANEL
Anion gap: 6 (ref 5–15)
BUN: 13 mg/dL (ref 8–23)
CO2: 22 mmol/L (ref 22–32)
Calcium: 8.2 mg/dL — ABNORMAL LOW (ref 8.9–10.3)
Chloride: 113 mmol/L — ABNORMAL HIGH (ref 98–111)
Creatinine, Ser: 0.54 mg/dL (ref 0.44–1.00)
GFR calc Af Amer: >60 mL/min (ref >60)
GFR calc non Af Amer: >60 mL/min (ref >60)
Glucose, Bld: 116 mg/dL — ABNORMAL HIGH (ref 70–99)
Potassium: 3.2 mmol/L — ABNORMAL LOW (ref 3.5–5.1)
Sodium: 141 mmol/L (ref 135–145)

## 2019-04-08 MED ORDER — POTASSIUM CHLORIDE ER 20 MEQ PO TBCR: 20.0000 meq | EXTENDED_RELEASE_TABLET | Freq: Two times a day (BID) | ORAL | 0 refills | Status: DC

## 2019-04-08 MED ORDER — TUBERCULIN PPD 5 UNIT/0.1ML ID SOLN
5.0000 [IU] | Freq: Once | INTRADERMAL | Status: DC
  Administered 2019-04-08: 13:00:00 5 [IU] via INTRADERMAL
  Filled 2019-04-08: qty 0.1

## 2019-04-08 MED ORDER — POTASSIUM CHLORIDE CRYS ER 20 MEQ PO TBCR
20.0000 meq | EXTENDED_RELEASE_TABLET | Freq: Two times a day (BID) | ORAL | Status: DC
  Administered 2019-04-08 – 2019-04-09 (×3): 20 meq via ORAL
  Filled 2019-04-08 (×3): qty 1

## 2019-04-08 NOTE — TOC Progression Note (Signed)
Transition of Care [facility]) - Progression Note    Patient Details  Name: Lauren [name] MRN: [id number] Date of Birth: 01-22-1951  Transition of Care [facility]) CM/SW Contact  Ishaan Villamar, Lenice Llamas Phone Number: [phone]  04/08/2019, 12:10 PM  Clinical Narrative: Clinical Social Worker (CSW) contacted Magda Paganini [name] worker this morning and made her aware that PT is recommending home health. CSW explained that patient does not meet criteria to go to a SNF for rehab. Per Magda Paganini patient's [facility] ACT Team RN Santiago Glad [phone] or [phone] has arranged for patient to be placed at [facility] group home located at [place], [place] [address], group home phone # [phone]. CSW contacted [facility] and spoke to owner Timoteo Expose [phone]. Per Clara they can accept patient tomorrow because they just painted the room today. Clara requested for CSW to fax FL2 to [phone] and [phone]. CSW faxed FL2 to both fax numbers however it did not go through. CSW spoke with Scot Jun [phone], the group home manager and Clara's daughter. CSW emailed FL2 to [employer] at [email or web address]. Per Carlyon Shadow they will pick patient up tomorrow and she requested that a PPD to be placed today. CSW also made Clara and Darlene aware that patient's last negative covid test was on 7/28. MD will order PPD and RN will place it today. [name] [phone] [place] Team lead contacted CSW and agreed to have [place] to read PPD on Saturday, which will be 48 hours after it's placed. Magda Paganini [name] worker has agreed to bring patient's c-pap to the group home. Per Magda Paganini she made patient's sister [name] aware of above. CSW met with patient and made her aware of above. Patient is agreeable to D/C to [facility] tomorrow. Patient voiced her concerns about her son and stated that she wished he could be placed in a group home. CSW provided emotional support and made [name]  worker Magda Paganini aware of above. Per Magda Paganini [name] will do an outreach for patient's son and check on him once patient is in the group home. Patient is agreeable to continue home health services with [facility]. Helene Kelp [facility] home health representative is aware of above. CSW will continue to follow and assist as needed.        Expected Discharge Plan: [place] Barriers to Discharge: Continued Medical Work up  Expected Discharge Plan and Services Expected Discharge Plan: [place] In-house Referral: Clinical Social Work   Post Acute Care Choice: [place] Living arrangements for the past 2 months: Single Family Home Expected Discharge Date: 04/08/19                                     Social Determinants of Health (SDOH) Interventions    Readmission Risk Interventions Readmission Risk Prevention Plan 02/16/2019  Transportation Screening Complete  PCP or Specialist Appt within 5-7 Days Complete  Home Care Screening Complete  Medication Review (RN CM) Complete  Some recent data might be hidden

## 2019-04-08 NOTE — Progress Notes (Signed)
PPD placed in L forearm at 1240 today.

## 2019-04-08 NOTE — Progress Notes (Signed)
[place] [facility] [place] NAME: Lauren [name]    MR#:  [id number]  DATE OF BIRTH:  September 22, 1950  SUBJECTIVE:   Still complaining of some right hand weakness, shortness of breath is improved.  Seen by physical therapy and the recommend home health services but patient to be discharged to a group home as per [name].  REVIEW OF SYSTEMS:    Review of Systems  Constitutional: Negative for chills and fever.  HENT: Negative for congestion and tinnitus.   Eyes: Negative for blurred vision and double vision.  Respiratory: Positive for shortness of breath (improved). Negative for cough and wheezing.   Cardiovascular: Negative for chest pain, orthopnea and PND.  Gastrointestinal: Negative for abdominal pain, diarrhea, nausea and vomiting.  Genitourinary: Negative for dysuria and hematuria.  Neurological: Positive for weakness (Right hand weakness). Negative for dizziness, sensory change and focal weakness.  All other systems reviewed and are negative.   Nutrition: Heart Healthy/Carb control Tolerating Diet: Yes Tolerating PT: Await Eval.    DRUG ALLERGIES:   Allergies  Allergen Reactions  . Percocet [Oxycodone-Acetaminophen] Diarrhea, Nausea And Vomiting and Nausea Only  . Tramadol Hcl Diarrhea, Nausea And Vomiting and Nausea Only  . Vicodin [Hydrocodone-Acetaminophen] Diarrhea, Nausea And Vomiting and Nausea Only    VITALS:  Blood pressure (!) 119/49, pulse 61, temperature 97.8 F (36.6 C), temperature source Oral, resp. rate 18, height 5\' 2"  (1.575 m), weight 100.5 kg, SpO2 100 %.  PHYSICAL EXAMINATION:   Physical Exam  GENERAL:  68 y.o.-year-old patient lying in bed in NAD.  EYES: Pupils equal, round, reactive to light and accommodation. No scleral icterus. Extraocular muscles intact.  HEENT: Head atraumatic, normocephalic. Dry Oral Mucosa.   NECK:  Supple, no jugular venous distention. No thyroid enlargement, no tenderness.  LUNGS:  Normal breath sounds bilaterally, no wheezing, rales, rhonchi. No use of accessory muscles of respiration.  CARDIOVASCULAR: S1, S2 normal. No murmurs, rubs, or gallops.  ABDOMEN: Soft, nontender, nondistended. Bowel sounds present. No organomegaly or mass.  EXTREMITIES: No cyanosis, clubbing or edema b/l.    NEUROLOGIC: Cranial nerves II through XII are intact. Right Hand/arm weakness 3/5 strength.    PSYCHIATRIC: The patient is alert and oriented x 3.  SKIN: No obvious rash, lesion, or ulcer.    LABORATORY PANEL:   CBC Recent Labs  Lab 04/07/19 0459  WBC 5.6  HGB 11.3*  HCT 33.2*  PLT 246   ------------------------------------------------------------------------------------------------------------------  Chemistries  Recent Labs  Lab 04/07/19 0459 04/08/19 0441  NA 141 141  K 2.8* 3.2*  CL 109 113*  CO2 21* 22  GLUCOSE 113* 116*  BUN 19 13  CREATININE 0.88 0.54  CALCIUM 8.7* 8.2*  MG 2.1  --   AST 19  --   ALT 13  --   ALKPHOS 66  --   BILITOT 1.3*  --    ------------------------------------------------------------------------------------------------------------------  Cardiac Enzymes No results for input(s): TROPONINI in the last 168 hours. ------------------------------------------------------------------------------------------------------------------  RADIOLOGY:  Dg Chest 2 View  Result Date: 04/06/2019 CLINICAL DATA:  Shortness of breath and hypotension EXAM: CHEST - 2 VIEW COMPARISON:  March 04, 2019 FINDINGS: There is no edema or consolidation. Postoperative changes noted on the left, stable. Heart upper normal in size with pulmonary vascularity normal. No adenopathy. There is degenerative change in the thoracic spine. Stable postoperative rib defect left posterolateral fourth rib. IMPRESSION: Postoperative change on the left. No edema or consolidation. Stable cardiac silhouette. Electronically Signed  By: Lowella Grip III M.D.   On: 04/06/2019 15:10    Mr Angio Neck W Wo Contrast  Result Date: 04/07/2019 CLINICAL DATA:  Mental status changes. Left carotid stenosis by ultrasound. EXAM: MRA NECK WITHOUT AND WITH CONTRAST TECHNIQUE: Multiplanar and multiecho pulse sequences of the neck were obtained without and with intravenous contrast. Angiographic images of the neck were obtained using MRA technique without and with intravenous contrast. CONTRAST:  10 cc Gadavist COMPARISON:  Ultrasound study 3 days ago. FINDINGS: Branching pattern of the arch is normal. No origin stenosis seen. Both common carotid arteries are widely patent to their respective bifurcation. Both carotid bifurcations appear widely patent without stenosis or irregularity. The cervical internal carotid arteries are tortuous but there is no stenosis. Tortuosity may have given the impression of stenosis. Both internal carotid arteries are patent through the skull base to the circle-of-Willis region. No circle-of-Willis stenosis or occlusion is identified. Both vertebral arteries are patent with the right being dominant. No origin stenosis is suspected. Both vertebral arteries are patent through the cervical region, through the foramen magnum to the basilar. No basilar stenosis. Posterior circulation branch vessels show flow. IMPRESSION: Negative MR angiography of the neck. Both carotid bifurcations appear widely patent without stenosis or irregularity. The cervical internal carotid arteries are tortuous, which may have given the impression of stenosis at ultrasound. Electronically Signed   By: [name] Chimes M.D.   On: 04/07/2019 13:37     ASSESSMENT AND PLAN:   68 year old female with past medical history of fibromyalgia, rheumatoid arthritis, schizoaffective disorder, previous history of seizures, obstructive sleep apnea, hypertension, hyperlipidemia who presented to the hospital due to shortness of breath and noted to be hypokalemic.  1.  Dyspnea/shortness of breath- etiology unclear  presently.  Patient is clinically not hypoxic and patient's imaging findings are negative for acute pathology.  -This appears to be more psychogenic in nature and not really pulmonary. - stable.   2.  Hypokalemia- improving with supplementation and will cont. To monitor.  - Mg level normal  3. Acute Renal failure - improved and resolved with IV fluids.  - Cr. Normalized.   4.  History of meningioma-patient is followed by neurosurgery and they recommended surgical intervention but patient has refused in the past.  Patient has some right hand and arm weakness which is probably suspected due to the meningioma with hx of Radiation therapy.  MRI of the neck obtained yesterday which shows no acute abnormality. - Also seen by neurology and discussed with them and no plans for further intervention.  Continue follow-up with neurosurgery as an outpatient for now.  5.  History of schizoaffective disorder- continue Lexapro, trazodone.  6.  Neuropathy-continue gabapentin.  7.  Hyperlipidemia-continue atorvastatin.  8.  GERD-continue Protonix.  Seen by physical therapy and they recommend short-term rehab but patient is going to be discharged to a group home as per the request of [name].   All the records are reviewed and case discussed with Care Management/Social Worker. Management plans discussed with the patient, family and they are in agreement.  CODE STATUS: Full code  DVT Prophylaxis: Lovenox  TOTAL TIME TAKING CARE OF THIS PATIENT: 30 minutes.   POSSIBLE D/C IN 1-2 DAYS, DEPENDING ON CLINICAL CONDITION.   Henreitta Leber M.D on 04/08/2019 at 1:48 PM  Between 7am to 6pm - Pager - [phone]  After 6pm go to [email or web address] - Technical brewer [place] [facility]  Office  [phone]  CC: Primary care physician;  Steele Sizer, MD

## 2019-04-09 LAB — POTASSIUM: Potassium: 3.5 mmol/L (ref 3.5–5.1)

## 2019-04-09 MED ORDER — POTASSIUM CHLORIDE CRYS ER 20 MEQ PO TBCR: 20.0000 meq | EXTENDED_RELEASE_TABLET | Freq: Two times a day (BID) | ORAL | 0 refills | Status: DC

## 2019-04-09 NOTE — Discharge Summary (Signed)
[facility] NAME: Lauren [name]    MR#:  [id number]  DATE OF BIRTH:  02/21/1951  DATE OF ADMISSION:  04/06/2019 ADMITTING PHYSICIAN: Nicholes Mango, MD  DATE OF DISCHARGE: 04/09/2019  PRIMARY CARE PHYSICIAN: Steele Sizer, MD    ADMISSION DIAGNOSIS:  Hypokalemia [E87.6] Hypomagnesemia [E83.42] AKI (acute kidney injury) ([place]) [N17.9]  DISCHARGE DIAGNOSIS:  Active Problems:   AKI (acute kidney injury) ([place])   SECONDARY DIAGNOSIS:   Past Medical History:  Diagnosis Date  . Anxiety   . Apnea, sleep 02/02/2014  . Awareness of heartbeats 02/02/2014  . Breathlessness on exertion 02/02/2014  . Diabetes mellitus   . Encounter for pre-employment examination 06/29/2015  . Excessive sweating 07/05/2015  . Fibromyalgia   . GERD (gastroesophageal reflux disease)   . Gravida 1 10/26/2015   1.    . Heart murmur   . Herniated disc   . Hyperlipidemia   . Hypertension   . Itch of skin 10/26/2015  . Lack of bladder control   . Lung tumor   . Rheumatoid arthritis ([place])   . Schizoaffective disorder ([place])   . Screening for cervical cancer 07/29/2017  . Seizure [facility])    after brain surgery 2012. last seizure 2013!  [name] Sex counseling 10/26/2015  . Sleep apnea   . 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 Stiffness of both knees 06/22/2015  . Stroke [facility])     HOSPITAL COURSE:   68 year old female with past medical history of fibromyalgia, rheumatoid arthritis, schizoaffective disorder, previous history of seizures, obstructive sleep apnea, hypertension, hyperlipidemia who presented to the hospital due to shortness of breath and noted to be hypokalemic.  1.  Dyspnea/shortness of breath- Patient was clinically not hypoxic and patient's imaging findings were negative for acute pathology.  -This appears to be more psychogenic in nature and not really pulmonary. - currently stable.    2.  Hypokalemia- improved  and resolved with supplementation and pt is being discharged on oral Potassium supplements.   3. Acute Renal failure - due to mild dehydration and improved and resolved with IV fluids.  - Cr. At baseline now.   4.  History of meningioma-patient is followed by neurosurgery and they recommended surgical intervention but patient has refused in the past.  Patient has some right hand and arm weakness which is probably suspected due to the meningioma with hx of Radiation therapy.  MRI of the neck obtained in the hospital which showed no evidence of acute pathology to explain her right hand weakness. - Also seen by neurology and discussed with them and no plans for further intervention.  Continue follow-up with neurosurgery as an outpatient for now.  5.  History of schizoaffective disorder- pt. Will  continue Lexapro, trazodone.  6.  Neuropathy-pt. Will continue gabapentin.  7.  Hyperlipidemia- pt. Will continue atorvastatin.  8.  GERD- pt. Will continue Omeprazole.  9.  Dry mouth/enlarged tongue- pulmonary ordered some serologic testing for possible Sjogren's syndrome.  Patient's ANA was negative but patient's SSA antibody is positive.  Patient be referred to outpatient rheumatology.  Patient is presently being discharged to a group home.  DISCHARGE CONDITIONS:   Stable.   CONSULTS OBTAINED:  Treatment Team:  Ottie Glazier, MD Leotis Pain, MD  DRUG ALLERGIES:   Allergies  Allergen Reactions  . Percocet [Oxycodone-Acetaminophen] Diarrhea, Nausea And Vomiting and Nausea Only  . Tramadol Hcl Diarrhea, Nausea And Vomiting and Nausea Only  . Vicodin [  Hydrocodone-Acetaminophen] Diarrhea, Nausea And Vomiting and Nausea Only    DISCHARGE MEDICATIONS:   Allergies as of 04/09/2019      Reactions   Percocet [oxycodone-acetaminophen] Diarrhea, Nausea And Vomiting, Nausea Only   Tramadol Hcl Diarrhea, Nausea And Vomiting, Nausea Only   Vicodin [hydrocodone-acetaminophen]  Diarrhea, Nausea And Vomiting, Nausea Only      Medication List    TAKE these medications   Ascorbic Acid 500 MG Chew Chew 1 tablet by mouth as directed.   aspirin EC 81 MG tablet Take 1 tablet (81 mg total) by mouth daily before breakfast.   atorvastatin 40 MG tablet Commonly known as: LIPITOR Take 1 tablet (40 mg total) by mouth daily at 6 PM.   benztropine 1 MG tablet Commonly known as: COGENTIN Take 1 tablet by mouth 3 (three) times daily.   Biotene Dry Mouth Gentle Pste Place 1 application onto teeth 2 (two) times daily.   Biotene Dry Mouth Moisturizing Soln Use as directed 1 application in the mouth or throat 2 (two) times daily.   escitalopram 10 MG tablet Commonly known as: LEXAPRO Take 10 mg by mouth daily.   gabapentin 100 MG capsule Commonly known as: NEURONTIN Take 100 mg by mouth 2 (two) times daily.   haloperidol decanoate 100 MG/ML injection Commonly known as: HALDOL DECANOATE   hydrochlorothiazide 25 MG tablet Commonly known as: HYDRODIURIL Take 1 tablet (25 mg total) by mouth daily.   ibuprofen 200 MG tablet Commonly known as: ADVIL Take 600 mg by mouth every 8 (eight) hours as needed (for knee pain.).   Knee Compression Sleeve/L/XL Misc 2 each by Does not apply route as needed. Wear during day; take off at night   levETIRAcetam 500 MG tablet Commonly known as: KEPPRA Take 1 tablet by mouth 2 (two) times a day.   lisinopril 20 MG tablet Commonly known as: ZESTRIL Take 1 tablet (20 mg total) by mouth daily.   metFORMIN 500 MG tablet Commonly known as: GLUCOPHAGE Take 1 tablet (500 mg total) by mouth 2 (two) times daily with a meal.   omeprazole 20 MG capsule Commonly known as: PRILOSEC Take 1 capsule (20 mg total) by mouth 2 (two) times daily.   potassium chloride SA 20 MEQ tablet Commonly known as: K-DUR Take 1 tablet (20 mEq total) by mouth 2 (two) times daily for 2 days.   traZODone 50 MG tablet Commonly known as: DESYREL Take 1  tablet (50 mg total) by mouth at bedtime.         DISCHARGE INSTRUCTIONS:   DIET:  Cardiac diet and Diabetic diet  DISCHARGE CONDITION:  Stable  ACTIVITY:  Activity as tolerated  OXYGEN:  Home Oxygen: No.   Oxygen Delivery: room air  DISCHARGE LOCATION:  group home   If you experience worsening of your admission symptoms, develop shortness of breath, life threatening emergency, suicidal or homicidal thoughts you must seek medical attention immediately by calling 911 or calling your MD immediately  if symptoms less severe.  You Must read complete instructions/literature along with all the possible adverse reactions/side effects for all the Medicines you take and that have been prescribed to you. Take any new Medicines after you have completely understood and accpet all the possible adverse reactions/side effects.   Please note  You were cared for by a hospitalist during your hospital stay. If you have any questions about your discharge medications or the care you received while you were in the hospital after you are discharged, you can call  the unit and asked to speak with the hospitalist on call if the hospitalist that took care of you is not available. Once you are discharged, your primary care physician will handle any further medical issues. Please note that NO REFILLS for any discharge medications will be authorized once you are discharged, as it is imperative that you return to your primary care physician (or establish a relationship with a primary care physician if you do not have one) for your aftercare needs so that they can reassess your need for medications and monitor your lab values.     Today   No acute events overnight, shortness of breath has improved and patient denies any complaints.  Will discharge to a group home today.  VITAL SIGNS:  Blood pressure 129/85, pulse (!) 56, temperature 98.1 F (36.7 C), temperature source Oral, resp. rate 16, height 5\' 2"   (1.575 m), weight 100.5 kg, SpO2 100 %.  I/O:    Intake/Output Summary (Last 24 hours) at 04/09/2019 1040 Last data filed at 04/09/2019 0955 Gross per 24 hour  Intake 1460 ml  Output -  Net 1460 ml    PHYSICAL EXAMINATION:   GENERAL:  68 y.o.-year-old obese patient lying in bed in NAD.  EYES: Pupils equal, round, reactive to light and accommodation. No scleral icterus. Extraocular muscles intact.  HEENT: Head atraumatic, normocephalic. Dry Oral Mucosa.   NECK:  Supple, no jugular venous distention. No thyroid enlargement, no tenderness.  LUNGS: Normal breath sounds bilaterally, no wheezing, rales, rhonchi. No use of accessory muscles of respiration.  CARDIOVASCULAR: S1, S2 normal. No murmurs, rubs, or gallops.  ABDOMEN: Soft, nontender, nondistended. Bowel sounds present. No organomegaly or mass.  EXTREMITIES: No cyanosis, clubbing or edema b/l.    NEUROLOGIC: Cranial nerves II through XII are intact. Right Hand/arm weakness 4/5 strength.    PSYCHIATRIC: The patient is alert and oriented x 3.  SKIN: No obvious rash, lesion, or ulcer.   DATA REVIEW:   CBC Recent Labs  Lab 04/07/19 0459  WBC 5.6  HGB 11.3*  HCT 33.2*  PLT 246    Chemistries  Recent Labs  Lab 04/07/19 0459 04/08/19 0441 04/09/19 0429  NA 141 141  --   K 2.8* 3.2* 3.5  CL 109 113*  --   CO2 21* 22  --   GLUCOSE 113* 116*  --   BUN 19 13  --   CREATININE 0.88 0.54  --   CALCIUM 8.7* 8.2*  --   MG 2.1  --   --   AST 19  --   --   ALT 13  --   --   ALKPHOS 66  --   --   BILITOT 1.3*  --   --     Cardiac Enzymes No results for input(s): TROPONINI in the last 168 hours.  Microbiology Results  Results for orders placed or performed during the hospital encounter of 04/06/19  SARS Coronavirus 2 (CEPHEID - Performed in [place] hospital lab), Hosp Order     Status: None   Collection Time: 04/06/19  3:48 PM   Specimen: Nasopharyngeal Swab  Result Value Ref Range Status   SARS Coronavirus 2  NEGATIVE NEGATIVE Final    Comment: (NOTE) If result is NEGATIVE SARS-CoV-2 target nucleic acids are NOT DETECTED. The SARS-CoV-2 RNA is generally detectable in upper and lower  respiratory specimens during the acute phase of infection. The lowest  concentration of SARS-CoV-2 viral copies this assay can detect is 250  copies / mL. A negative result does not preclude SARS-CoV-2 infection  and should not be used as the sole basis for treatment or other  patient management decisions.  A negative result may occur with  improper specimen collection / handling, submission of specimen other  than nasopharyngeal swab, presence of viral mutation(s) within the  areas targeted by this assay, and inadequate number of viral copies  (<250 copies / mL). A negative result must be combined with clinical  observations, patient history, and epidemiological information. If result is POSITIVE SARS-CoV-2 target nucleic acids are DETECTED. The SARS-CoV-2 RNA is generally detectable in upper and lower  respiratory specimens dur ing the acute phase of infection.  Positive  results are indicative of active infection with SARS-CoV-2.  Clinical  correlation with patient history and other diagnostic information is  necessary to determine patient infection status.  Positive results do  not rule out bacterial infection or co-infection with other viruses. If result is PRESUMPTIVE POSTIVE SARS-CoV-2 nucleic acids MAY BE PRESENT.   A presumptive positive result was obtained on the submitted specimen  and confirmed on repeat testing.  While 2019 novel coronavirus  (SARS-CoV-2) nucleic acids may be present in the submitted sample  additional confirmatory testing may be necessary for epidemiological  and / or clinical management purposes  to differentiate between  SARS-CoV-2 and other Sarbecovirus currently known to infect humans.  If clinically indicated additional testing with an alternate test  methodology [phone])  is advised. The SARS-CoV-2 RNA is generally  detectable in upper and lower respiratory sp ecimens during the acute  phase of infection. The expected result is Negative. Fact Sheet for Patients:  StrictlyIdeas.no Fact Sheet for Healthcare Providers: BankingDealers.co.za This test is not yet approved or cleared by the [place] FDA and has been authorized for detection and/or diagnosis of SARS-CoV-2 by FDA under an Emergency Use Authorization (EUA).  This EUA will remain in effect (meaning this test can be used) for the duration of the COVID-19 declaration under Section 564(b)(1) of the Act, 21 U.S.C. section 360bbb-3(b)(1), unless the authorization is terminated or revoked sooner. Performed at [facility], [address], [place], [place] [address]     RADIOLOGY:  Mr Angio Neck W Wo Contrast  Result Date: 04/07/2019 CLINICAL DATA:  Mental status changes. Left carotid stenosis by ultrasound. EXAM: MRA NECK WITHOUT AND WITH CONTRAST TECHNIQUE: Multiplanar and multiecho pulse sequences of the neck were obtained without and with intravenous contrast. Angiographic images of the neck were obtained using MRA technique without and with intravenous contrast. CONTRAST:  10 cc Gadavist COMPARISON:  Ultrasound study 3 days ago. FINDINGS: Branching pattern of the arch is normal. No origin stenosis seen. Both common carotid arteries are widely patent to their respective bifurcation. Both carotid bifurcations appear widely patent without stenosis or irregularity. The cervical internal carotid arteries are tortuous but there is no stenosis. Tortuosity may have given the impression of stenosis. Both internal carotid arteries are patent through the skull base to the circle-of-Willis region. No circle-of-Willis stenosis or occlusion is identified. Both vertebral arteries are patent with the right being dominant. No origin stenosis is suspected. Both  vertebral arteries are patent through the cervical region, through the foramen magnum to the basilar. No basilar stenosis. Posterior circulation branch vessels show flow. IMPRESSION: Negative MR angiography of the neck. Both carotid bifurcations appear widely patent without stenosis or irregularity. The cervical internal carotid arteries are tortuous, which may have given the impression of stenosis at ultrasound. Electronically  Signed   By: [name] Chimes M.D.   On: 04/07/2019 13:37      Management plans discussed with the patient, family and they are in agreement.  CODE STATUS:     Code Status Orders  (From admission, onward)         Start     Ordered   04/06/19 1742  Full code  Continuous     04/06/19 1741         TOTAL TIME TAKING CARE OF THIS PATIENT: 40 minutes.    Henreitta Leber M.D on 04/09/2019 at 10:40 AM  Between 7am to 6pm - Pager - [phone]  After 6pm go to [email or web address] - Technical brewer [facility]  Office  [phone]  CC: Primary care physician; Steele Sizer, MD

## 2019-04-09 NOTE — Plan of Care (Signed)
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
## 2019-04-09 NOTE — Progress Notes (Signed)
Called and gave report to [facility] owner of the group home

## 2019-04-09 NOTE — TOC Transition Note (Signed)
Transition of Care [facility]) - CM/SW Discharge Note   Patient Details  Name: Lauren [name] MRN: [id number] Date of Birth: 03/08/1951  Transition of Care [facility]) CM/SW Contact:  Renarda Mullinix, Lenice Llamas Phone Number: [phone]  04/09/2019, 2:26 PM   Clinical Narrative: Patient is medically stable for D/C to [facility] group home today. Per [facility] group home owner they will send a staff member to pick patient up in front of the medical mall entrance at 4 pm today. Milus Glazier [name] worker is aware of above D/C today. Magda Paganini reported that she brought patient's c-pap and clothes to the group home today. Santiago Glad Therapist, sports for [employer] patient's ACT team is aware of D/C today and will read PPD on Saturday. PPD was placed by bedside RN yesterday 7/30 at ay 12:40 pm. CSW emailed D/C summary and FL2 to group home manager Scot Jun because their fax machine is down. RN will call report and CSW prepared D/C packet. CSW completed RSVP on 7/30. Helene Kelp [facility] home health representative is aware of D/C today. [facility] will continue to provide home health services in the group home for patient. Patient is aware of above. CSW contacted patient's sister [name] and made her aware of above. Please reconsult if future social work needs arise. CSW signing off.       Final next level of care: Group Home Barriers to Discharge: Barriers Resolved   Patient Goals and CMS Choice Patient states their goals for this hospitalization and ongoing recovery are:: To feel better   Choice offered to / list presented to : Patient  Discharge Placement PASRR number recieved: 04/08/19(RSVP)            [facility]) Patient to be transferred to facility by: Group home will provide transport. Name of family member notified: Patient's sister [name] is aware of D/C today. Patient and family notified of of transfer: 04/09/19  Discharge Plan and Services In-house Referral: Clinical Social Work   Post Acute Care Choice: [place]: PT [place] Agency: [facility] [employer]) Date [place]: 04/09/19   Representative spoke with at [place]: [place] ([place]) Interventions     Readmission Risk Interventions Readmission Risk Prevention Plan 02/16/2019  Transportation Screening Complete  PCP or Specialist Appt within 5-7 Days Complete  Home Care Screening Complete  Medication Review (RN CM) Complete  Some recent data might be hidden

## 2019-04-09 NOTE — Care Management Important Message (Signed)
Important Message  Patient Details  Name: Lauren [name] MRN: [id number] Date of Birth: 1950-12-03   Medicare Important Message Given:  Yes     Juliann Pulse A Katiria Calame 04/09/2019, 11:06 AM

## 2019-04-11 DIAGNOSIS — M069 Rheumatoid arthritis, unspecified: Secondary | ICD-10-CM | POA: Diagnosis not present

## 2019-04-11 DIAGNOSIS — I69328 Other speech and language deficits following cerebral infarction: Secondary | ICD-10-CM | POA: Diagnosis not present

## 2019-04-11 DIAGNOSIS — K219 Gastro-esophageal reflux disease without esophagitis: Secondary | ICD-10-CM | POA: Diagnosis not present

## 2019-04-11 DIAGNOSIS — E785 Hyperlipidemia, unspecified: Secondary | ICD-10-CM | POA: Diagnosis not present

## 2019-04-11 DIAGNOSIS — J9621 Acute and chronic respiratory failure with hypoxia: Secondary | ICD-10-CM | POA: Diagnosis not present

## 2019-04-11 DIAGNOSIS — Z7984 Long term (current) use of oral hypoglycemic drugs: Secondary | ICD-10-CM | POA: Diagnosis not present

## 2019-04-11 DIAGNOSIS — I2510 Atherosclerotic heart disease of native coronary artery without angina pectoris: Secondary | ICD-10-CM | POA: Diagnosis not present

## 2019-04-11 DIAGNOSIS — G4700 Insomnia, unspecified: Secondary | ICD-10-CM | POA: Diagnosis not present

## 2019-04-11 DIAGNOSIS — Z96652 Presence of left artificial knee joint: Secondary | ICD-10-CM | POA: Diagnosis not present

## 2019-04-11 DIAGNOSIS — I10 Essential (primary) hypertension: Secondary | ICD-10-CM | POA: Diagnosis not present

## 2019-04-11 DIAGNOSIS — M797 Fibromyalgia: Secondary | ICD-10-CM | POA: Diagnosis not present

## 2019-04-11 DIAGNOSIS — G4730 Sleep apnea, unspecified: Secondary | ICD-10-CM | POA: Diagnosis not present

## 2019-04-11 DIAGNOSIS — Z9181 History of falling: Secondary | ICD-10-CM | POA: Diagnosis not present

## 2019-04-11 DIAGNOSIS — R1312 Dysphagia, oropharyngeal phase: Secondary | ICD-10-CM | POA: Diagnosis not present

## 2019-04-11 DIAGNOSIS — E119 Type 2 diabetes mellitus without complications: Secondary | ICD-10-CM | POA: Diagnosis not present

## 2019-04-12 ENCOUNTER — Encounter (HOSPITAL_COMMUNITY): Payer: Self-pay | Admitting: Psychiatry

## 2019-04-12 ENCOUNTER — Telehealth: Payer: Self-pay

## 2019-04-12 NOTE — Telephone Encounter (Signed)
Transition Care Management Follow-up Telephone Call  Date of discharge and from where: 04/09/19 [facility]  How have you been since you were released from the hospital? Pt was breathing rapidly upon answering the phone and states she is now at a group home but worried she is the only woman there and states staff come and go  Any questions or concerns? Yes  - pt concerned about paying for where she is staying  Items Reviewed:  Did the pt receive and understand the discharge instructions provided? Unable to ask patient about discharge instructions or medications, she seemed distraught and c/o breathing too fast while on the phone. When I asked if there was anyone there I could speak to she said she had to go and hung up and then did not answer again  Medications obtained and verified? No   Any new allergies since your discharge? No   Dietary orders reviewed? No  Do you have support at home? Yes   Functional Questionnaire: (I = Independent and D = Dependent) ADLs: n/a  Bathing/Dressing- n/a  Meal Prep- D  Eating- I  Maintaining continence- n/a  Transferring/Ambulation- n/a  Managing Meds- D  Follow up appointments reviewed:   PCP Hospital f/u appt confirmed? No  Scheduled to see Raelyn Ensign on 04/22/19 @ 1:40.  [facility] f/u appt confirmed? NO Scheduled to see Dr. Annalee Genta on 04/15/19 @ 1:15.  Are transportation arrangements needed? Yes   If their condition worsens, is the pt aware to call PCP or go to the Emergency Dept.? Yes  Was the patient provided with contact information for the PCP's office or ED? Yes  Was to pt encouraged to call back with questions or concerns? Yes  I will attempt to reach the patient again to follow up prior to HFU appt.

## 2019-04-14 DIAGNOSIS — E119 Type 2 diabetes mellitus without complications: Secondary | ICD-10-CM | POA: Diagnosis not present

## 2019-04-14 DIAGNOSIS — J9621 Acute and chronic respiratory failure with hypoxia: Secondary | ICD-10-CM | POA: Diagnosis not present

## 2019-04-14 DIAGNOSIS — R1312 Dysphagia, oropharyngeal phase: Secondary | ICD-10-CM | POA: Diagnosis not present

## 2019-04-14 DIAGNOSIS — M797 Fibromyalgia: Secondary | ICD-10-CM | POA: Diagnosis not present

## 2019-04-14 DIAGNOSIS — Z96652 Presence of left artificial knee joint: Secondary | ICD-10-CM | POA: Diagnosis not present

## 2019-04-14 DIAGNOSIS — I2510 Atherosclerotic heart disease of native coronary artery without angina pectoris: Secondary | ICD-10-CM | POA: Diagnosis not present

## 2019-04-14 DIAGNOSIS — M069 Rheumatoid arthritis, unspecified: Secondary | ICD-10-CM | POA: Diagnosis not present

## 2019-04-14 DIAGNOSIS — G4700 Insomnia, unspecified: Secondary | ICD-10-CM | POA: Diagnosis not present

## 2019-04-14 DIAGNOSIS — Z7984 Long term (current) use of oral hypoglycemic drugs: Secondary | ICD-10-CM | POA: Diagnosis not present

## 2019-04-14 DIAGNOSIS — G4730 Sleep apnea, unspecified: Secondary | ICD-10-CM | POA: Diagnosis not present

## 2019-04-14 DIAGNOSIS — E785 Hyperlipidemia, unspecified: Secondary | ICD-10-CM | POA: Diagnosis not present

## 2019-04-14 DIAGNOSIS — Z9181 History of falling: Secondary | ICD-10-CM | POA: Diagnosis not present

## 2019-04-14 DIAGNOSIS — I69328 Other speech and language deficits following cerebral infarction: Secondary | ICD-10-CM | POA: Diagnosis not present

## 2019-04-14 DIAGNOSIS — I10 Essential (primary) hypertension: Secondary | ICD-10-CM | POA: Diagnosis not present

## 2019-04-14 DIAGNOSIS — K219 Gastro-esophageal reflux disease without esophagitis: Secondary | ICD-10-CM | POA: Diagnosis not present

## 2019-04-15 DIAGNOSIS — D472 Monoclonal gammopathy: Secondary | ICD-10-CM | POA: Diagnosis not present

## 2019-04-15 DIAGNOSIS — R682 Dry mouth, unspecified: Secondary | ICD-10-CM | POA: Diagnosis not present

## 2019-04-15 DIAGNOSIS — R768 Other specified abnormal immunological findings in serum: Secondary | ICD-10-CM | POA: Insufficient documentation

## 2019-04-16 DIAGNOSIS — I69328 Other speech and language deficits following cerebral infarction: Secondary | ICD-10-CM | POA: Diagnosis not present

## 2019-04-16 DIAGNOSIS — Z7984 Long term (current) use of oral hypoglycemic drugs: Secondary | ICD-10-CM | POA: Diagnosis not present

## 2019-04-16 DIAGNOSIS — I10 Essential (primary) hypertension: Secondary | ICD-10-CM | POA: Diagnosis not present

## 2019-04-16 DIAGNOSIS — R1312 Dysphagia, oropharyngeal phase: Secondary | ICD-10-CM | POA: Diagnosis not present

## 2019-04-16 DIAGNOSIS — J9621 Acute and chronic respiratory failure with hypoxia: Secondary | ICD-10-CM | POA: Diagnosis not present

## 2019-04-16 DIAGNOSIS — E785 Hyperlipidemia, unspecified: Secondary | ICD-10-CM | POA: Diagnosis not present

## 2019-04-16 DIAGNOSIS — Z96652 Presence of left artificial knee joint: Secondary | ICD-10-CM | POA: Diagnosis not present

## 2019-04-16 DIAGNOSIS — G4730 Sleep apnea, unspecified: Secondary | ICD-10-CM | POA: Diagnosis not present

## 2019-04-16 DIAGNOSIS — K219 Gastro-esophageal reflux disease without esophagitis: Secondary | ICD-10-CM | POA: Diagnosis not present

## 2019-04-16 DIAGNOSIS — E119 Type 2 diabetes mellitus without complications: Secondary | ICD-10-CM | POA: Diagnosis not present

## 2019-04-16 DIAGNOSIS — Z9181 History of falling: Secondary | ICD-10-CM | POA: Diagnosis not present

## 2019-04-16 DIAGNOSIS — M069 Rheumatoid arthritis, unspecified: Secondary | ICD-10-CM | POA: Diagnosis not present

## 2019-04-16 DIAGNOSIS — G4700 Insomnia, unspecified: Secondary | ICD-10-CM | POA: Diagnosis not present

## 2019-04-16 DIAGNOSIS — I2510 Atherosclerotic heart disease of native coronary artery without angina pectoris: Secondary | ICD-10-CM | POA: Diagnosis not present

## 2019-04-16 DIAGNOSIS — M797 Fibromyalgia: Secondary | ICD-10-CM | POA: Diagnosis not present

## 2019-04-19 ENCOUNTER — Telehealth: Payer: Self-pay

## 2019-04-19 DIAGNOSIS — M797 Fibromyalgia: Secondary | ICD-10-CM | POA: Diagnosis not present

## 2019-04-19 DIAGNOSIS — Z96652 Presence of left artificial knee joint: Secondary | ICD-10-CM | POA: Diagnosis not present

## 2019-04-19 DIAGNOSIS — E785 Hyperlipidemia, unspecified: Secondary | ICD-10-CM | POA: Diagnosis not present

## 2019-04-19 DIAGNOSIS — J9621 Acute and chronic respiratory failure with hypoxia: Secondary | ICD-10-CM | POA: Diagnosis not present

## 2019-04-19 DIAGNOSIS — R1312 Dysphagia, oropharyngeal phase: Secondary | ICD-10-CM | POA: Diagnosis not present

## 2019-04-19 DIAGNOSIS — I10 Essential (primary) hypertension: Secondary | ICD-10-CM | POA: Diagnosis not present

## 2019-04-19 DIAGNOSIS — K219 Gastro-esophageal reflux disease without esophagitis: Secondary | ICD-10-CM | POA: Diagnosis not present

## 2019-04-19 DIAGNOSIS — M069 Rheumatoid arthritis, unspecified: Secondary | ICD-10-CM | POA: Diagnosis not present

## 2019-04-19 DIAGNOSIS — Z7984 Long term (current) use of oral hypoglycemic drugs: Secondary | ICD-10-CM | POA: Diagnosis not present

## 2019-04-19 DIAGNOSIS — I69328 Other speech and language deficits following cerebral infarction: Secondary | ICD-10-CM | POA: Diagnosis not present

## 2019-04-19 DIAGNOSIS — I2510 Atherosclerotic heart disease of native coronary artery without angina pectoris: Secondary | ICD-10-CM | POA: Diagnosis not present

## 2019-04-19 DIAGNOSIS — G4730 Sleep apnea, unspecified: Secondary | ICD-10-CM | POA: Diagnosis not present

## 2019-04-19 DIAGNOSIS — Z9181 History of falling: Secondary | ICD-10-CM | POA: Diagnosis not present

## 2019-04-19 DIAGNOSIS — E119 Type 2 diabetes mellitus without complications: Secondary | ICD-10-CM | POA: Diagnosis not present

## 2019-04-19 DIAGNOSIS — G4700 Insomnia, unspecified: Secondary | ICD-10-CM | POA: Diagnosis not present

## 2019-04-19 NOTE — Telephone Encounter (Signed)
Copied from [place] [phone]. Topic: Quick Communication - Home Health Verbal Orders >> Apr 19, 2019  8:10 AM Parke Poisson wrote: Caller/Agency: Tiffany from [facility] needs verbal orders Callback Number: [phone] Requesting PT Frequency: 1 x1 2x3 1x3

## 2019-04-20 DIAGNOSIS — G4700 Insomnia, unspecified: Secondary | ICD-10-CM | POA: Diagnosis not present

## 2019-04-20 DIAGNOSIS — I69328 Other speech and language deficits following cerebral infarction: Secondary | ICD-10-CM | POA: Diagnosis not present

## 2019-04-20 DIAGNOSIS — J9621 Acute and chronic respiratory failure with hypoxia: Secondary | ICD-10-CM | POA: Diagnosis not present

## 2019-04-20 DIAGNOSIS — Z7984 Long term (current) use of oral hypoglycemic drugs: Secondary | ICD-10-CM | POA: Diagnosis not present

## 2019-04-20 DIAGNOSIS — K219 Gastro-esophageal reflux disease without esophagitis: Secondary | ICD-10-CM | POA: Diagnosis not present

## 2019-04-20 DIAGNOSIS — Z96652 Presence of left artificial knee joint: Secondary | ICD-10-CM | POA: Diagnosis not present

## 2019-04-20 DIAGNOSIS — Z9181 History of falling: Secondary | ICD-10-CM | POA: Diagnosis not present

## 2019-04-20 DIAGNOSIS — G4730 Sleep apnea, unspecified: Secondary | ICD-10-CM | POA: Diagnosis not present

## 2019-04-20 DIAGNOSIS — M069 Rheumatoid arthritis, unspecified: Secondary | ICD-10-CM | POA: Diagnosis not present

## 2019-04-20 DIAGNOSIS — E785 Hyperlipidemia, unspecified: Secondary | ICD-10-CM | POA: Diagnosis not present

## 2019-04-20 DIAGNOSIS — I2510 Atherosclerotic heart disease of native coronary artery without angina pectoris: Secondary | ICD-10-CM | POA: Diagnosis not present

## 2019-04-20 DIAGNOSIS — E119 Type 2 diabetes mellitus without complications: Secondary | ICD-10-CM | POA: Diagnosis not present

## 2019-04-20 DIAGNOSIS — R1312 Dysphagia, oropharyngeal phase: Secondary | ICD-10-CM | POA: Diagnosis not present

## 2019-04-20 DIAGNOSIS — I10 Essential (primary) hypertension: Secondary | ICD-10-CM | POA: Diagnosis not present

## 2019-04-20 DIAGNOSIS — M797 Fibromyalgia: Secondary | ICD-10-CM | POA: Diagnosis not present

## 2019-04-22 ENCOUNTER — Ambulatory Visit (INDEPENDENT_AMBULATORY_CARE_PROVIDER_SITE_OTHER): Payer: Medicare Other | Admitting: Family Medicine

## 2019-04-22 ENCOUNTER — Other Ambulatory Visit: Payer: Self-pay

## 2019-04-22 ENCOUNTER — Encounter: Payer: Self-pay | Admitting: Family Medicine

## 2019-04-22 VITALS — BP 134/82 | HR 85 | Temp 97.3°F | Resp 18 | Ht 65.0 in | Wt 219.1 lb

## 2019-04-22 DIAGNOSIS — E876 Hypokalemia: Secondary | ICD-10-CM | POA: Diagnosis not present

## 2019-04-22 DIAGNOSIS — E782 Mixed hyperlipidemia: Secondary | ICD-10-CM

## 2019-04-22 DIAGNOSIS — R202 Paresthesia of skin: Secondary | ICD-10-CM

## 2019-04-22 DIAGNOSIS — F200 Paranoid schizophrenia: Secondary | ICD-10-CM

## 2019-04-22 DIAGNOSIS — D329 Benign neoplasm of meninges, unspecified: Secondary | ICD-10-CM

## 2019-04-22 DIAGNOSIS — R200 Anesthesia of skin: Secondary | ICD-10-CM | POA: Diagnosis not present

## 2019-04-22 DIAGNOSIS — R0602 Shortness of breath: Secondary | ICD-10-CM | POA: Diagnosis not present

## 2019-04-22 DIAGNOSIS — F419 Anxiety disorder, unspecified: Secondary | ICD-10-CM

## 2019-04-22 DIAGNOSIS — R682 Dry mouth, unspecified: Secondary | ICD-10-CM

## 2019-04-22 DIAGNOSIS — K219 Gastro-esophageal reflux disease without esophagitis: Secondary | ICD-10-CM

## 2019-04-22 DIAGNOSIS — N179 Acute kidney failure, unspecified: Secondary | ICD-10-CM | POA: Diagnosis not present

## 2019-04-22 DIAGNOSIS — R471 Dysarthria and anarthria: Secondary | ICD-10-CM

## 2019-04-22 NOTE — Progress Notes (Signed)
Name: Lauren [name]   MRN: [id number]    DOB: [birth year]   Date:04/22/2019       Progress Note  Subjective  Chief Complaint  Chief Complaint  Patient presents with  . Hospitalization Follow-up    HPI  PT presents for HFU for Dyspnea, hypokalemia, and ARF along with history of menigioma.  Discharged 04/09/2019, TOC call 04/12/2019.  She is accompanied by Ms. Clara Laurance Flatten who is the owner of the group home where the patient stays.   1. Dyspnea/shortness of breath - Patient was clinically not hypoxic and patient's imaging findings were negative for acute pathology. This appears to be more psychogenic in nature and not really pulmonary.  Patient and Ms. Moore both note that this has been ongoing and seems to occur when she gets excited or anxious. No wheezing, cough, or fever.  CT Angio of the chest found no PE.  2. Hypokalemia - improved and resolved with supplementation and pt was discharged on oral Potassium supplements of 20MEQ BIDx2 days, and is now done with this course of treatment.  3. Acute Renal failure - due to mild dehydration and improved and resolved with IV fluids.  - Cr. We will recheck labs today, though returned to baseline while in hospital.  She denies dysuria, polyuria, no abdominal pain.  4. History of meningioma -patient is followed by neurosurgery and theyrecommended surgical intervention but patient has refusedin the past; she states she may consider intervention now. Patient has some right hand and arm weakness which is probably suspected due to the meningioma with hx of Radiation therapy. MRI of the neck obtained in the hospital which showed no evidence of acute pathology to explain her right hand weakness. -Also seen by neurology and discussed with them and no plans for further intervention. Continue follow-up with neurosurgery as an outpatient for now - has appointment with Dr. Lacinda Axon on 06/15/2019.  5. History of schizoaffective disorder- pt. Will  continue Lexapro, trazodone. Living in group home and doing well - asked her about her safety concerns that were voiced on her phone call and she states she is feeling safe there now.   6. Neuropathy -- taking gabapentin and doing well on this.   7. Hyperlipidemia - pt. Taking atorvastatin - doing well on this; no chest pain, no myalgias; dyspnea as above.  8. GERD - pt. Will continue Omeprazole. This has been stable as long as she takes the medication and avoids triggering foods.   9.  Dry mouth/enlarged tongue - pulmonary ordered some serologic testing for possible Sjogren's syndrome.  Patient's ANA was negative but patient's SSA antibody is positive.  Patient be referred to outpatient rheumatology and has appointment with Dr. Meda Coffee on 07/14/2019.   Patient Active Problem List   Diagnosis Date Noted  . AKI (acute kidney injury) ([place]) 04/06/2019  . Slurred speech 03/04/2019  . Chest pain in adult 02/15/2019  . Acute respiratory failure ([place]) 02/15/2019  . Seizure-like activity ([place]) 12/29/2018  . Seizures ([place]) 11/24/2018  . Paresthesias 11/22/2018  . Dry mouth 10/12/2018  . Hx of resection of meningioma 07/21/2018  . Tremor 07/21/2018  . Schizoaffective disorder ([place]) 12/29/2017  . Morbid obesity ([place]) 12/09/2017  . Meningioma ([place]) 11/25/2017  . Atherosclerosis of aorta ([place]) 11/25/2017  . Calcification of coronary artery 11/25/2017  . Schizophrenia ([place]) 11/04/2017  . Acid reflux 06/29/2015  . Diabetes mellitus type 2, controlled, without complications ([place]) 19/50/9326  . Cardiac murmur 06/29/2015  . Arthritis of knee, degenerative 06/28/2015  .  Insomnia w/ sleep apnea 03/21/2015  . Anxiety disorder 03/21/2015  . Hypertension 03/21/2015  . HLD (hyperlipidemia) 03/21/2015  . Urge incontinence 03/21/2015    Past Surgical History:  Procedure Laterality Date  . BRAIN TUMOR EXCISION  2012   benign  . CARDIAC CATHETERIZATION  2008  . CESAREAN SECTION    . CYST REMOVAL  HAND    . LUNG LOBECTOMY  1977   benign tumor  . 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 Heart disease Brother   . Depression Mother   . Heart attack Mother   . Stroke Mother   . Alcohol abuse Father   . Stroke Father   . Diabetes Sister   . Diabetes Sister   . Stomach cancer Sister   . Kidney disease Sister   . COPD Brother   . Lung cancer Brother   . Diabetes Brother     Social History   Socioeconomic History  . Marital status: Single    Spouse name: Not on file  . Number of children: 1  . Years of education: Not on file  . Highest education level: 12th grade  Occupational History  . Not on file  Social Needs  . Financial resource strain: Somewhat hard  . Food insecurity    Worry: Sometimes true    Inability: Never true  . Transportation needs    Medical: No    Non-medical: No  Tobacco Use  . Smoking status: Never Smoker  . Smokeless tobacco: Never Used  Substance and Sexual Activity  . Alcohol use: No    Alcohol/week: 0.0 standard drinks  . Drug use: No  . Sexual activity: Not Currently  Lifestyle  . Physical activity    Days per week: 0 days    Minutes per session: 0 min  . Stress: To some extent  Relationships  . Social connections    Talks on phone: More than three times a week    Gets together: Three times a week    Attends religious service: More than 4 times per year    Active member of club or organization: No    Attends meetings of clubs or organizations: Never    Relationship status: Married  . Intimate partner violence    Fear of current or ex partner: No    Emotionally abused: No    Physically abused: No    Forced sexual activity: No  Other Topics Concern  . Not on file  Social History Narrative  . Not on file     Current Outpatient Medications:  .   Artificial Saliva (BIOTENE DRY MOUTH MOISTURIZING) SOLN, Use as directed 1 application in the mouth or throat 2 (two) times daily., Disp: 44.3 mL, Rfl: 5 .  Ascorbic Acid 500 MG CHEW, Chew 1 tablet by mouth as directed. , Disp: , Rfl:  .  aspirin EC 81 MG tablet, Take 1 tablet (81 mg total) by mouth daily before breakfast., Disp: 30 tablet, Rfl: 5 .  atorvastatin (LIPITOR) 40 MG tablet, Take 1 tablet (40 mg total) by mouth daily at 6 PM., Disp: 30 tablet, Rfl: 2 .  benztropine (COGENTIN) 1 MG tablet, Take 1 tablet by mouth 3 (three) times daily., Disp: , Rfl:  .  Dentifrices (BIOTENE DRY MOUTH GENTLE) PSTE, Place 1 application onto teeth 2 (  two) times daily., Disp: 121.9 g, Rfl: 5 .  escitalopram (LEXAPRO) 10 MG tablet, Take 10 mg by mouth daily. , Disp: , Rfl:  .  gabapentin (NEURONTIN) 100 MG capsule, Take 100 mg by mouth 2 (two) times daily., Disp: , Rfl:  .  haloperidol decanoate (HALDOL DECANOATE) 100 MG/ML injection, , Disp: , Rfl:  .  hydrochlorothiazide (HYDRODIURIL) 25 MG tablet, Take 1 tablet (25 mg total) by mouth daily., Disp: 30 tablet, Rfl: 2 .  ibuprofen (ADVIL,MOTRIN) 200 MG tablet, Take 600 mg by mouth every 8 (eight) hours as needed (for knee pain.)., Disp: , Rfl:  .  levETIRAcetam (KEPPRA) 500 MG tablet, Take 1 tablet by mouth 2 (two) times a day., Disp: , Rfl:  .  lisinopril (ZESTRIL) 20 MG tablet, Take 1 tablet (20 mg total) by mouth daily., Disp: 30 tablet, Rfl: 2 .  omeprazole (PRILOSEC) 20 MG capsule, Take 1 capsule (20 mg total) by mouth 2 (two) times daily., Disp: 180 capsule, Rfl: 0 .  traZODone (DESYREL) 50 MG tablet, Take 1 tablet (50 mg total) by mouth at bedtime., Disp: 30 tablet, Rfl: 0 .  Elastic Bandages & Supports (KNEE COMPRESSION SLEEVE/L/XL) MISC, 2 each by Does not apply route as needed. Wear during day; take off at night (Patient not taking: Reported on 04/22/2019), Disp: 2 each, Rfl: 0 .  metFORMIN (GLUCOPHAGE) 500 MG tablet, Take 1 tablet (500 mg total) by  mouth 2 (two) times daily with a meal., Disp: 30 tablet, Rfl: 2 .  potassium chloride SA (K-DUR) 20 MEQ tablet, Take 1 tablet (20 mEq total) by mouth 2 (two) times daily for 2 days., Disp: 4 tablet, Rfl: 0  Allergies  Allergen Reactions  . Percocet [Oxycodone-Acetaminophen] Diarrhea, Nausea And Vomiting and Nausea Only  . Tramadol Hcl Diarrhea, Nausea And Vomiting and Nausea Only  . Vicodin [Hydrocodone-Acetaminophen] Diarrhea, Nausea And Vomiting and Nausea Only    I personally reviewed active problem list, medication list, allergies, notes from last encounter, lab results with the patient/caregiver today.   ROS  Constitutional: Negative for fever or weight change.  Respiratory: Negative for cough; some shortness of breath.   Cardiovascular: Negative for chest pain or palpitations.  Gastrointestinal: Negative for abdominal pain, no bowel changes.  Musculoskeletal: Negative for gait problem or joint swelling.  Skin: Negative for rash.  Neurological: Negative for dizziness or headache.  No other specific complaints in a complete review of systems (except as listed in HPI above).  Objective  Vitals:   04/22/19 1343  BP: 134/82  Pulse: 85  Resp: 18  Temp: (!) 97.3 F (36.3 C)  TempSrc: Oral  SpO2: 97%  Weight: 219 lb 1.6 oz (99.4 kg)  Height: 5\' 5"  (1.651 m)    Body mass index is 36.46 kg/m.  Physical Exam  Constitutional: Patient appears well-developed and well-nourished. No distress.  HENT: Head: Normocephalic and atraumatic. Eyes: Conjunctivae and EOM are normal. No scleral icterus.  Neck: Normal range of motion. Neck supple. No JVD present. No thyromegaly present.  Cardiovascular: Normal rate, regular rhythm and normal heart sounds.  No murmur heard. No BLE edema. Pulmonary/Chest: Effort normal and breath sounds normal. No respiratory distress. Musculoskeletal: Normal range of motion, no joint effusions. No gross deformities Neurological: Pt is alert and oriented  to person, place, and time. No cranial nerve deficit. Coordination, balance, strength, speech and gait are normal.  Skin: Skin is warm and dry. No rash noted. No erythema.  Psychiatric: Patient has a normal mood and  affect. behavior is normal. Judgment and thought content normal.  No results found for this or any previous visit (from the past 72 hour(s)).  PHQ2/9: Depression screen [facility] 2/9 02/26/2019 12/04/2018 10/22/2018 09/28/2018 08/03/2018  Decreased Interest 1 0 0 0 2  Down, Depressed, Hopeless 1 0 0 0 0  PHQ - 2 Score 2 0 0 0 2  Altered sleeping 1 0 0 0 0  Tired, decreased energy 2 3 3  0 3  Change in appetite 3 1 1  0 3  Feeling bad or failure about yourself  0 0 0 0 0  Trouble concentrating 0 0 3 0 2  Moving slowly or fidgety/restless 0 1 1 0 2  Suicidal thoughts 0 0 0 0 0  PHQ-9 Score 8 5 8  0 12  Difficult doing work/chores Somewhat difficult - Not difficult at all Not difficult at all Somewhat difficult  Some recent data might be hidden   PHQ-2/9 Result is positive.    Fall Risk: Fall Risk  02/26/2019 12/04/2018 10/22/2018 09/28/2018 09/10/2018  Falls in the past year? 0 1 1 1 1   Number falls in past yr: 0 1 1 1 1   Injury with Fall? 0 0 0 0 0  Risk for fall due to : - - History of fall(s) - -  Follow up - - Falls prevention discussed Falls evaluation completed -     Assessment & Plan  1. SOB (shortness of breath) - Appears to be psychogenic - when she is at rest, not speaking, and I am speaking with her caregiver, her respirations are normal, when attention is directed at her, the respiratory rate increases. Also question if facial mask contributes to her shortness of breath. - COMPLETE METABOLIC PANEL WITH GFR - CBC with Differential/Platelet - Magnesium  2. Hypokalemia - COMPLETE METABOLIC PANEL WITH GFR - Magnesium  3. AKI (acute kidney injury) ([place]) - COMPLETE METABOLIC PANEL WITH GFR  4. Meningioma [facility]) - Seeing Dr. Lacinda Axon in October 2020  5. Dysarthria - Stable  and unchanged from previous visits.  6. Schizophrenia, paranoid ([place]) - stable, continue medications  7. Anxiety disorder, unspecified type - Stable, continues medications  8. Numbness and tingling - Stable, on gabapentin - COMPLETE METABOLIC PANEL WITH GFR - Magnesium  9. Mixed hyperlipidemia - Continue statin medication  10. Dry mouth - Seeing Dr. Meda Coffee in November for eval.  11. Gastroesophageal reflux disease, esophagitis presence not specified - Taking omeprazole and avoiding triggers.

## 2019-04-22 NOTE — Patient Instructions (Signed)
Dr. Meda Coffee - Rheumatology 07/14/2019 at [facility].

## 2019-04-23 ENCOUNTER — Other Ambulatory Visit: Payer: Self-pay | Admitting: Family Medicine

## 2019-04-23 DIAGNOSIS — Z96652 Presence of left artificial knee joint: Secondary | ICD-10-CM | POA: Diagnosis not present

## 2019-04-23 DIAGNOSIS — E785 Hyperlipidemia, unspecified: Secondary | ICD-10-CM | POA: Diagnosis not present

## 2019-04-23 DIAGNOSIS — G4700 Insomnia, unspecified: Secondary | ICD-10-CM | POA: Diagnosis not present

## 2019-04-23 DIAGNOSIS — K219 Gastro-esophageal reflux disease without esophagitis: Secondary | ICD-10-CM | POA: Diagnosis not present

## 2019-04-23 DIAGNOSIS — J9621 Acute and chronic respiratory failure with hypoxia: Secondary | ICD-10-CM | POA: Diagnosis not present

## 2019-04-23 DIAGNOSIS — I10 Essential (primary) hypertension: Secondary | ICD-10-CM | POA: Diagnosis not present

## 2019-04-23 DIAGNOSIS — E876 Hypokalemia: Secondary | ICD-10-CM

## 2019-04-23 DIAGNOSIS — E119 Type 2 diabetes mellitus without complications: Secondary | ICD-10-CM | POA: Diagnosis not present

## 2019-04-23 DIAGNOSIS — M069 Rheumatoid arthritis, unspecified: Secondary | ICD-10-CM | POA: Diagnosis not present

## 2019-04-23 DIAGNOSIS — I69328 Other speech and language deficits following cerebral infarction: Secondary | ICD-10-CM | POA: Diagnosis not present

## 2019-04-23 DIAGNOSIS — Z7984 Long term (current) use of oral hypoglycemic drugs: Secondary | ICD-10-CM | POA: Diagnosis not present

## 2019-04-23 DIAGNOSIS — I2510 Atherosclerotic heart disease of native coronary artery without angina pectoris: Secondary | ICD-10-CM | POA: Diagnosis not present

## 2019-04-23 DIAGNOSIS — M797 Fibromyalgia: Secondary | ICD-10-CM | POA: Diagnosis not present

## 2019-04-23 DIAGNOSIS — G4730 Sleep apnea, unspecified: Secondary | ICD-10-CM | POA: Diagnosis not present

## 2019-04-23 DIAGNOSIS — R1312 Dysphagia, oropharyngeal phase: Secondary | ICD-10-CM | POA: Diagnosis not present

## 2019-04-23 DIAGNOSIS — Z9181 History of falling: Secondary | ICD-10-CM | POA: Diagnosis not present

## 2019-04-23 LAB — CBC WITH DIFFERENTIAL/PLATELET
Absolute Monocytes: 441 cells/uL (ref 200–950)
Basophils Absolute: 58 cells/uL (ref 0–200)
Basophils Relative: 1 %
Eosinophils Absolute: 180 cells/uL (ref 15–500)
Eosinophils Relative: 3.1 %
HCT: 31.2 % — ABNORMAL LOW (ref 35.0–45.0)
Hemoglobin: 10.5 g/dL — ABNORMAL LOW (ref 11.7–15.5)
Lymphs Abs: 2355 cells/uL (ref 850–3900)
MCH: 32.4 pg (ref 27.0–33.0)
MCHC: 33.7 g/dL (ref 32.0–36.0)
MCV: 96.3 fL (ref 80.0–100.0)
MPV: 10.7 fL (ref 7.5–12.5)
Monocytes Relative: 7.6 %
Neutro Abs: 2767 cells/uL (ref 1500–7800)
Neutrophils Relative %: 47.7 %
Platelets: 256 10*3/uL (ref 140–400)
RBC: 3.24 10*6/uL — ABNORMAL LOW (ref 3.80–5.10)
RDW: 13.8 % (ref 11.0–15.0)
Total Lymphocyte: 40.6 %
WBC: 5.8 10*3/uL (ref 3.8–10.8)

## 2019-04-23 LAB — COMPLETE METABOLIC PANEL WITH GFR
AG Ratio: 1.5 (calc) (ref 1.0–2.5)
ALT: 9 U/L (ref 6–29)
AST: 12 U/L (ref 10–35)
Albumin: 3.8 g/dL (ref 3.6–5.1)
Alkaline phosphatase (APISO): 64 U/L (ref 37–153)
BUN: 19 mg/dL (ref 7–25)
CO2: 22 mmol/L (ref 20–32)
Calcium: 10 mg/dL (ref 8.6–10.4)
Chloride: 104 mmol/L (ref 98–110)
Creat: 0.83 mg/dL (ref 0.50–0.99)
GFR, Est African American: 85 mL/min/{1.73_m2} (ref >=60)
GFR, Est Non African American: 73 mL/min/{1.73_m2} (ref >=60)
Globulin: 2.6 g/dL (calc) (ref 1.9–3.7)
Glucose, Bld: 122 mg/dL — ABNORMAL HIGH (ref 65–99)
Potassium: 3.2 mmol/L — ABNORMAL LOW (ref 3.5–5.3)
Sodium: 140 mmol/L (ref 135–146)
Total Bilirubin: 0.5 mg/dL (ref 0.2–1.2)
Total Protein: 6.4 g/dL (ref 6.1–8.1)

## 2019-04-23 LAB — MAGNESIUM: Magnesium: 1.2 mg/dL — ABNORMAL LOW (ref 1.5–2.5)

## 2019-04-23 MED ORDER — MAGNESIUM OXIDE -MG SUPPLEMENT 400 (240 MG) MG PO TABS: 1.0000 | ORAL_TABLET | Freq: Every day | ORAL | 0 refills | Status: DC

## 2019-04-23 MED ORDER — POTASSIUM CHLORIDE ER 10 MEQ PO TBCR: 10.0000 meq | EXTENDED_RELEASE_TABLET | Freq: Two times a day (BID) | ORAL | 0 refills | Status: DC

## 2019-04-26 ENCOUNTER — Other Ambulatory Visit: Payer: Self-pay

## 2019-04-26 ENCOUNTER — Inpatient Hospital Stay: Payer: Medicare Other

## 2019-04-26 ENCOUNTER — Inpatient Hospital Stay: Payer: Medicare Other | Attending: Oncology | Admitting: Oncology

## 2019-04-26 ENCOUNTER — Encounter: Payer: Self-pay | Admitting: Oncology

## 2019-04-26 DIAGNOSIS — Z8673 Personal history of transient ischemic attack (TIA), and cerebral infarction without residual deficits: Secondary | ICD-10-CM | POA: Diagnosis not present

## 2019-04-26 DIAGNOSIS — Z885 Allergy status to narcotic agent status: Secondary | ICD-10-CM | POA: Diagnosis not present

## 2019-04-26 DIAGNOSIS — J9621 Acute and chronic respiratory failure with hypoxia: Secondary | ICD-10-CM | POA: Diagnosis not present

## 2019-04-26 DIAGNOSIS — Z96652 Presence of left artificial knee joint: Secondary | ICD-10-CM | POA: Diagnosis not present

## 2019-04-26 DIAGNOSIS — F419 Anxiety disorder, unspecified: Secondary | ICD-10-CM | POA: Diagnosis not present

## 2019-04-26 DIAGNOSIS — E785 Hyperlipidemia, unspecified: Secondary | ICD-10-CM

## 2019-04-26 DIAGNOSIS — Z8249 Family history of ischemic heart disease and other diseases of the circulatory system: Secondary | ICD-10-CM | POA: Insufficient documentation

## 2019-04-26 DIAGNOSIS — R0602 Shortness of breath: Secondary | ICD-10-CM | POA: Insufficient documentation

## 2019-04-26 DIAGNOSIS — R531 Weakness: Secondary | ICD-10-CM | POA: Diagnosis not present

## 2019-04-26 DIAGNOSIS — D472 Monoclonal gammopathy: Secondary | ICD-10-CM | POA: Insufficient documentation

## 2019-04-26 DIAGNOSIS — M797 Fibromyalgia: Secondary | ICD-10-CM | POA: Diagnosis not present

## 2019-04-26 DIAGNOSIS — G4700 Insomnia, unspecified: Secondary | ICD-10-CM | POA: Diagnosis not present

## 2019-04-26 DIAGNOSIS — I6522 Occlusion and stenosis of left carotid artery: Secondary | ICD-10-CM | POA: Diagnosis not present

## 2019-04-26 DIAGNOSIS — Z801 Family history of malignant neoplasm of trachea, bronchus and lung: Secondary | ICD-10-CM | POA: Insufficient documentation

## 2019-04-26 DIAGNOSIS — R4182 Altered mental status, unspecified: Secondary | ICD-10-CM | POA: Insufficient documentation

## 2019-04-26 DIAGNOSIS — Z9181 History of falling: Secondary | ICD-10-CM | POA: Diagnosis not present

## 2019-04-26 DIAGNOSIS — Z7984 Long term (current) use of oral hypoglycemic drugs: Secondary | ICD-10-CM

## 2019-04-26 DIAGNOSIS — G4730 Sleep apnea, unspecified: Secondary | ICD-10-CM | POA: Diagnosis not present

## 2019-04-26 DIAGNOSIS — E119 Type 2 diabetes mellitus without complications: Secondary | ICD-10-CM | POA: Diagnosis not present

## 2019-04-26 DIAGNOSIS — M069 Rheumatoid arthritis, unspecified: Secondary | ICD-10-CM

## 2019-04-26 DIAGNOSIS — Z800 Family history of malignant neoplasm of digestive organs: Secondary | ICD-10-CM | POA: Diagnosis not present

## 2019-04-26 DIAGNOSIS — R5383 Other fatigue: Secondary | ICD-10-CM | POA: Diagnosis not present

## 2019-04-26 DIAGNOSIS — I2510 Atherosclerotic heart disease of native coronary artery without angina pectoris: Secondary | ICD-10-CM | POA: Diagnosis not present

## 2019-04-26 DIAGNOSIS — Z79899 Other long term (current) drug therapy: Secondary | ICD-10-CM

## 2019-04-26 DIAGNOSIS — Z811 Family history of alcohol abuse and dependence: Secondary | ICD-10-CM | POA: Diagnosis not present

## 2019-04-26 DIAGNOSIS — D649 Anemia, unspecified: Secondary | ICD-10-CM | POA: Insufficient documentation

## 2019-04-26 DIAGNOSIS — Z833 Family history of diabetes mellitus: Secondary | ICD-10-CM

## 2019-04-26 DIAGNOSIS — K219 Gastro-esophageal reflux disease without esophagitis: Secondary | ICD-10-CM | POA: Diagnosis not present

## 2019-04-26 DIAGNOSIS — Z818 Family history of other mental and behavioral disorders: Secondary | ICD-10-CM | POA: Diagnosis not present

## 2019-04-26 DIAGNOSIS — E876 Hypokalemia: Secondary | ICD-10-CM | POA: Diagnosis not present

## 2019-04-26 DIAGNOSIS — Z825 Family history of asthma and other chronic lower respiratory diseases: Secondary | ICD-10-CM

## 2019-04-26 DIAGNOSIS — I10 Essential (primary) hypertension: Secondary | ICD-10-CM | POA: Insufficient documentation

## 2019-04-26 DIAGNOSIS — R1312 Dysphagia, oropharyngeal phase: Secondary | ICD-10-CM | POA: Diagnosis not present

## 2019-04-26 DIAGNOSIS — I69328 Other speech and language deficits following cerebral infarction: Secondary | ICD-10-CM | POA: Diagnosis not present

## 2019-04-26 LAB — CBC WITH DIFFERENTIAL/PLATELET
Abs Immature Granulocytes: 0.01 10*3/uL (ref 0.00–0.07)
Basophils Absolute: 0 10*3/uL (ref 0.0–0.1)
Basophils Relative: 1 %
Eosinophils Absolute: 0.2 10*3/uL (ref 0.0–0.5)
Eosinophils Relative: 3 %
HCT: 29.9 % — ABNORMAL LOW (ref 36.0–46.0)
Hemoglobin: 10.4 g/dL — ABNORMAL LOW (ref 12.0–15.0)
Immature Granulocytes: 0 %
Lymphocytes Relative: 43 %
Lymphs Abs: 2.3 10*3/uL (ref 0.7–4.0)
MCH: 32.6 pg (ref 26.0–34.0)
MCHC: 34.8 g/dL (ref 30.0–36.0)
MCV: 93.7 fL (ref 80.0–100.0)
Monocytes Absolute: 0.4 10*3/uL (ref 0.1–1.0)
Monocytes Relative: 7 %
Neutro Abs: 2.5 10*3/uL (ref 1.7–7.7)
Neutrophils Relative %: 46 %
Platelets: 258 10*3/uL (ref 150–400)
RBC: 3.19 MIL/uL — ABNORMAL LOW (ref 3.87–5.11)
RDW: 14.2 % (ref 11.5–15.5)
WBC: 5.4 10*3/uL (ref 4.0–10.5)
nRBC: 0 % (ref 0.0–0.2)

## 2019-04-26 LAB — BASIC METABOLIC PANEL
Anion gap: 13 (ref 5–15)
BUN: 16 mg/dL (ref 8–23)
CO2: 20 mmol/L — ABNORMAL LOW (ref 22–32)
Calcium: 9.3 mg/dL (ref 8.9–10.3)
Chloride: 106 mmol/L (ref 98–111)
Creatinine, Ser: 0.85 mg/dL (ref 0.44–1.00)
GFR calc Af Amer: >60 mL/min (ref >60)
GFR calc non Af Amer: >60 mL/min (ref >60)
Glucose, Bld: 122 mg/dL — ABNORMAL HIGH (ref 70–99)
Potassium: 2.7 mmol/L — CL (ref 3.5–5.1)
Sodium: 139 mmol/L (ref 135–145)

## 2019-04-26 NOTE — Progress Notes (Signed)
[facility]  Telephone:([phone] [phone]  ID: Lauren [name] OB: [birth year]  MR#: [id number]  ION#:[id number]  Patient Care Team: Alba Cory, MD as PCP - General (Family Medicine) Pleasant, Dennard Schaumann, RN as [facility] Care Management [facility] 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  She has chronic shortness of breath and appears to be gasping for air, but her oxygen saturations are currently 100%.  She also has an essential tremor of unknown etiology.  She has no other neurologic complaints.  She has chronic weakness and fatigue.  She denies any pain.  She has a fair appetite, but denies weight loss.  She has no chest pain, cough, or hemoptysis.  She denies any nausea, vomiting, constipation, or diarrhea.  She has no urinary complaints.  Patient offers no further specific complaints today.  REVIEW OF SYSTEMS:   Review of Systems  Constitutional: Positive for malaise/fatigue. Negative for fever.  Respiratory: Positive for shortness of breath. Negative for cough and hemoptysis.   Cardiovascular: Negative.  Negative for chest pain.  Gastrointestinal: Negative.  Negative for abdominal pain, blood in stool and melena.  Genitourinary: Negative.  Negative for dysuria.  Musculoskeletal: Negative.  Negative for back pain.  Skin: Negative.  Negative for rash.  Neurological: Positive for weakness. Negative for dizziness,  focal weakness and headaches.  Psychiatric/Behavioral: Negative.  The patient is not nervous/anxious.     As per HPI. Otherwise, a complete review of systems is negative.  PAST MEDICAL HISTORY: Past Medical History:  Diagnosis Date  . Anxiety   . Apnea, sleep 02/02/2014  . Awareness of heartbeats 02/02/2014  . Breathlessness on exertion 02/02/2014  . Diabetes mellitus   . Encounter for pre-employment examination 06/29/2015  . Excessive sweating 07/05/2015  . Fibromyalgia   . GERD (gastroesophageal reflux disease)   . Gravida 1 10/26/2015   1.    . Heart murmur   . Herniated disc   . Hyperlipidemia   . Hypertension   . Itch of skin 10/26/2015  . Lack of bladder control   . Lung tumor   . Rheumatoid arthritis (HCC)   . Schizoaffective disorder (HCC)   . Screening for cervical cancer 07/29/2017  . Seizure [facility])    after brain surgery 2012. last seizure 2013!  [name] Sex counseling 10/26/2015  . Sleep apnea   . 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 Stiffness of both knees 06/22/2015  . Stroke [facility])     PAST SURGICAL HISTORY: Past Surgical History:  Procedure Laterality Date  . BRAIN TUMOR EXCISION  2012   benign  . CARDIAC CATHETERIZATION  2008  . CESAREAN SECTION    . CYST REMOVAL HAND    . LUNG LOBECTOMY  1977   benign tumor  . TOTAL KNEE ARTHROPLASTY Left 01/28/2017   Procedure: TOTAL KNEE ARTHROPLASTY;  Surgeon: Christena Flake, MD;  Location: [facility] ORS;  Service: Orthopedics;  Laterality: Left;    FAMILY HISTORY: Family History  Problem Relation Age of Onset  . Heart  disease Brother   . Depression Mother   . Heart attack Mother   . Stroke Mother   . Alcohol abuse Father   . Stroke Father   . Diabetes Sister   . Diabetes Sister   . Stomach cancer Sister   . Kidney disease Sister   . COPD Brother   . Lung cancer Brother   . Diabetes Brother     ADVANCED DIRECTIVES (Y/N):  N  HEALTH MAINTENANCE: Social History   Tobacco Use  .  Smoking status: Never Smoker  . Smokeless tobacco: Never Used  Substance Use Topics  . Alcohol use: No    Alcohol/week: 0.0 standard drinks  . Drug use: No     Colonoscopy:  PAP:  Bone density:  Lipid panel:  Allergies  Allergen Reactions  . Percocet [Oxycodone-Acetaminophen] Diarrhea, Nausea And Vomiting and Nausea Only  . Tramadol Hcl Diarrhea, Nausea And Vomiting and Nausea Only  . Vicodin [Hydrocodone-Acetaminophen] Diarrhea, Nausea And Vomiting and Nausea Only    Current Outpatient Medications  Medication Sig Dispense Refill  . Artificial Saliva (BIOTENE DRY MOUTH MOISTURIZING) SOLN Use as directed 1 application in the mouth or throat 2 (two) times daily. 44.3 mL 5  . Ascorbic Acid 500 MG CHEW Chew 1 tablet by mouth as directed.     [name] aspirin EC 81 MG tablet Take 1 tablet (81 mg total) by mouth daily before breakfast. 30 tablet 5  . atorvastatin (LIPITOR) 40 MG tablet Take 1 tablet (40 mg total) by mouth daily at 6 PM. 30 tablet 2  . benztropine (COGENTIN) 1 MG tablet Take 1 tablet by mouth 3 (three) times daily.    . Dentifrices (BIOTENE DRY MOUTH GENTLE) PSTE Place 1 application onto teeth 2 (two) times daily. 121.9 g 5  . Elastic Bandages & Supports (KNEE COMPRESSION SLEEVE/L/XL) MISC 2 each by Does not apply route as needed. Wear during day; take off at night 2 each 0  . escitalopram (LEXAPRO) 10 MG tablet Take 10 mg by mouth daily.     [name] gabapentin (NEURONTIN) 100 MG capsule Take 100 mg by mouth 2 (two) times daily.    . haloperidol decanoate (HALDOL DECANOATE) 100 MG/ML injection     . hydrochlorothiazide (HYDRODIURIL) 25 MG tablet Take 1 tablet (25 mg total) by mouth daily. 30 tablet 2  . ibuprofen (ADVIL,MOTRIN) 200 MG tablet Take 600 mg by mouth every 8 (eight) hours as needed (for knee pain.).    [name] levETIRAcetam (KEPPRA) 500 MG tablet Take 1 tablet by mouth 2 (two) times a day.    . lisinopril (ZESTRIL) 20 MG tablet Take 1 tablet (20 mg total) by mouth daily. 30  tablet 2  . Magnesium Oxide 400 (240 Mg) MG TABS Take 1 tablet (400 mg total) by mouth daily. 4 tablet 0  . metFORMIN (GLUCOPHAGE) 500 MG tablet Take 1 tablet (500 mg total) by mouth 2 (two) times daily with a meal. 30 tablet 2  . omeprazole (PRILOSEC) 20 MG capsule Take 1 capsule (20 mg total) by mouth 2 (two) times daily. 180 capsule 0  . pilocarpine (SALAGEN) 5 MG tablet Take 1 tablet by mouth 3 (three) times daily.    . potassium chloride (K-DUR) 10 MEQ tablet Take 1 tablet (10 mEq total) by mouth 2 (two) times daily. 8 tablet 0  . traZODone (DESYREL) 50 MG tablet Take 1 tablet (50 mg total) by mouth at bedtime. 30 tablet 0   No current facility-administered medications for this visit.  OBJECTIVE: There were no vitals filed for this visit.   There is no height or weight on file to calculate BMI.    ECOG FS:2 - Symptomatic, <50% confined to bed  General: Appears uncomfortable, mild respiratory distress.  Sitting in a wheelchair.  Eyes: Pink conjunctiva, anicteric sclera. HEENT: Normocephalic, moist mucous membranes, clear oropharnyx. Lungs: Clear to auscultation bilaterally. Heart: Regular rate and rhythm. No rubs, murmurs, or gallops. Abdomen: Soft, nontender, nondistended. No organomegaly noted, normoactive bowel sounds. Musculoskeletal: No edema, cyanosis, or clubbing. Neuro: Alert, answering all questions appropriately. Cranial nerves grossly intact. Skin: No rashes or petechiae noted. Psych: Normal affect.  LAB RESULTS:  Lab Results  Component Value Date   NA 139 04/26/2019   K 2.7 (LL) 04/26/2019   CL 106 04/26/2019   CO2 20 (L) 04/26/2019   GLUCOSE 122 (H) 04/26/2019   BUN 16 04/26/2019   CREATININE 0.85 04/26/2019   CALCIUM 9.3 04/26/2019   PROT 6.4 04/22/2019   ALBUMIN 3.8 04/07/2019   AST 12 04/22/2019   ALT 9 04/22/2019   ALKPHOS 66 04/07/2019   BILITOT 0.5 04/22/2019   GFRNONAA >60 04/26/2019   GFRAA >60 04/26/2019    Lab Results  Component Value Date    WBC 5.4 04/26/2019   NEUTROABS 2.5 04/26/2019   HGB 10.4 (L) 04/26/2019   HCT 29.9 (L) 04/26/2019   MCV 93.7 04/26/2019   PLT 258 04/26/2019     STUDIES: Dg Chest 2 View  Result Date: 04/06/2019 CLINICAL DATA:  Shortness of breath and hypotension EXAM: CHEST - 2 VIEW COMPARISON:  March 04, 2019 FINDINGS: There is no edema or consolidation. Postoperative changes noted on the left, stable. Heart upper normal in size with pulmonary vascularity normal. No adenopathy. There is degenerative change in the thoracic spine. Stable postoperative rib defect left posterolateral fourth rib. IMPRESSION: Postoperative change on the left. No edema or consolidation. Stable cardiac silhouette. Electronically Signed   By: Bretta Bang III M.D.   On: 04/06/2019 15:10   Mr Angio Neck W Wo Contrast  Result Date: 04/07/2019 CLINICAL DATA:  Mental status changes. Left carotid stenosis by ultrasound. EXAM: MRA NECK WITHOUT AND WITH CONTRAST TECHNIQUE: Multiplanar and multiecho pulse sequences of the neck were obtained without and with intravenous contrast. Angiographic images of the neck were obtained using MRA technique without and with intravenous contrast. CONTRAST:  10 cc Gadavist COMPARISON:  Ultrasound study 3 days ago. FINDINGS: Branching pattern of the arch is normal. No origin stenosis seen. Both common carotid arteries are widely patent to their respective bifurcation. Both carotid bifurcations appear widely patent without stenosis or irregularity. The cervical internal carotid arteries are tortuous but there is no stenosis. Tortuosity may have given the impression of stenosis. Both internal carotid arteries are patent through the skull base to the circle-of-Willis region. No circle-of-Willis stenosis or occlusion is identified. Both vertebral arteries are patent with the right being dominant. No origin stenosis is suspected. Both vertebral arteries are patent through the cervical region, through the foramen  magnum to the basilar. No basilar stenosis. Posterior circulation branch vessels show flow. IMPRESSION: Negative MR angiography of the neck. Both carotid bifurcations appear widely patent without stenosis or irregularity. The cervical internal carotid arteries are tortuous, which may have given the impression of stenosis at ultrasound. Electronically Signed   By: Paulina Fusi M.D.   On: 04/07/2019 13:37    ASSESSMENT: IgA gammopathy  PLAN:   1. IgA gammopathy: Patient noted to have a mildly elevated IgA  level on blood work.  She did not have an M spike.  Repeat laboratory work from today is pending.  Will also draw kappa/lambda light chains for completeness.  Patient has a mild anemia, but no other evidence of endorgan damage.  She does not require bone marrow biopsy or metastatic bone survey, but would consider one in the future if necessary.  No intervention is needed at this time.  Return to clinic in 3 months with repeat laboratory work and further evaluation. 2.  Anemia: Mild, chronic and unchanged.  Monitor. 3.  Hypokalemia: Patient currently taking potassium supplementation.  Will forward results to primary care physician for further evaluation and treatment. 4.  Shortness of breath: Patient's oxygen saturations are 100% in clinic.  Monitor.  I spent a total of 45 minutes face-to-face with the patient of which greater than 50% of the visit was spent in counseling and coordination of care as detailed above.   Patient expressed understanding and was in agreement with this plan. She also understands that She can call clinic at any time with any questions, concerns, or complaints.     Jeralyn Ruths, MD   04/26/2019 3:45 PM

## 2019-04-27 LAB — IGG, IGA, IGM
IgA: 559 mg/dL — ABNORMAL HIGH (ref 87–352)
IgG (Immunoglobin G), Serum: 1136 mg/dL (ref 586–1602)
IgM (Immunoglobulin M), Srm: 76 mg/dL (ref 26–217)

## 2019-04-27 LAB — KAPPA/LAMBDA LIGHT CHAINS
Kappa free light chain: 27.7 mg/L — ABNORMAL HIGH (ref 3.3–19.4)
Kappa, lambda light chain ratio: 1.01 (ref 0.26–1.65)
Lambda free light chains: 27.5 mg/L — ABNORMAL HIGH (ref 5.7–26.3)

## 2019-04-28 DIAGNOSIS — G4730 Sleep apnea, unspecified: Secondary | ICD-10-CM | POA: Diagnosis not present

## 2019-04-28 DIAGNOSIS — Z7984 Long term (current) use of oral hypoglycemic drugs: Secondary | ICD-10-CM | POA: Diagnosis not present

## 2019-04-28 DIAGNOSIS — Z96652 Presence of left artificial knee joint: Secondary | ICD-10-CM | POA: Diagnosis not present

## 2019-04-28 DIAGNOSIS — I2510 Atherosclerotic heart disease of native coronary artery without angina pectoris: Secondary | ICD-10-CM | POA: Diagnosis not present

## 2019-04-28 DIAGNOSIS — R1312 Dysphagia, oropharyngeal phase: Secondary | ICD-10-CM | POA: Diagnosis not present

## 2019-04-28 DIAGNOSIS — M069 Rheumatoid arthritis, unspecified: Secondary | ICD-10-CM | POA: Diagnosis not present

## 2019-04-28 DIAGNOSIS — G4700 Insomnia, unspecified: Secondary | ICD-10-CM | POA: Diagnosis not present

## 2019-04-28 DIAGNOSIS — K219 Gastro-esophageal reflux disease without esophagitis: Secondary | ICD-10-CM | POA: Diagnosis not present

## 2019-04-28 DIAGNOSIS — I69328 Other speech and language deficits following cerebral infarction: Secondary | ICD-10-CM | POA: Diagnosis not present

## 2019-04-28 DIAGNOSIS — M797 Fibromyalgia: Secondary | ICD-10-CM | POA: Diagnosis not present

## 2019-04-28 DIAGNOSIS — Z9181 History of falling: Secondary | ICD-10-CM | POA: Diagnosis not present

## 2019-04-28 DIAGNOSIS — E785 Hyperlipidemia, unspecified: Secondary | ICD-10-CM | POA: Diagnosis not present

## 2019-04-28 DIAGNOSIS — E119 Type 2 diabetes mellitus without complications: Secondary | ICD-10-CM | POA: Diagnosis not present

## 2019-04-28 DIAGNOSIS — I10 Essential (primary) hypertension: Secondary | ICD-10-CM | POA: Diagnosis not present

## 2019-04-28 DIAGNOSIS — J9621 Acute and chronic respiratory failure with hypoxia: Secondary | ICD-10-CM | POA: Diagnosis not present

## 2019-04-28 LAB — PROTEIN ELECTROPHORESIS, SERUM
A/G Ratio: 1.3 (ref 0.7–1.7)
Albumin ELP: 3.7 g/dL (ref 2.9–4.4)
Alpha-1-Globulin: 0.2 g/dL (ref 0.0–0.4)
Alpha-2-Globulin: 0.6 g/dL (ref 0.4–1.0)
Beta Globulin: 1.2 g/dL (ref 0.7–1.3)
Gamma Globulin: 0.9 g/dL (ref 0.4–1.8)
Globulin, Total: 2.9 g/dL (ref 2.2–3.9)
Total Protein ELP: 6.6 g/dL (ref 6.0–8.5)

## 2019-04-29 ENCOUNTER — Telehealth: Payer: Self-pay | Admitting: Family Medicine

## 2019-04-29 DIAGNOSIS — I10 Essential (primary) hypertension: Secondary | ICD-10-CM | POA: Diagnosis not present

## 2019-04-29 DIAGNOSIS — Z96652 Presence of left artificial knee joint: Secondary | ICD-10-CM | POA: Diagnosis not present

## 2019-04-29 DIAGNOSIS — M069 Rheumatoid arthritis, unspecified: Secondary | ICD-10-CM | POA: Diagnosis not present

## 2019-04-29 DIAGNOSIS — K219 Gastro-esophageal reflux disease without esophagitis: Secondary | ICD-10-CM | POA: Diagnosis not present

## 2019-04-29 DIAGNOSIS — M797 Fibromyalgia: Secondary | ICD-10-CM | POA: Diagnosis not present

## 2019-04-29 DIAGNOSIS — I69328 Other speech and language deficits following cerebral infarction: Secondary | ICD-10-CM | POA: Diagnosis not present

## 2019-04-29 DIAGNOSIS — J9621 Acute and chronic respiratory failure with hypoxia: Secondary | ICD-10-CM | POA: Diagnosis not present

## 2019-04-29 DIAGNOSIS — E119 Type 2 diabetes mellitus without complications: Secondary | ICD-10-CM | POA: Diagnosis not present

## 2019-04-29 DIAGNOSIS — G4700 Insomnia, unspecified: Secondary | ICD-10-CM | POA: Diagnosis not present

## 2019-04-29 DIAGNOSIS — Z9181 History of falling: Secondary | ICD-10-CM | POA: Diagnosis not present

## 2019-04-29 DIAGNOSIS — G4730 Sleep apnea, unspecified: Secondary | ICD-10-CM | POA: Diagnosis not present

## 2019-04-29 DIAGNOSIS — E785 Hyperlipidemia, unspecified: Secondary | ICD-10-CM | POA: Diagnosis not present

## 2019-04-29 DIAGNOSIS — I2510 Atherosclerotic heart disease of native coronary artery without angina pectoris: Secondary | ICD-10-CM | POA: Diagnosis not present

## 2019-04-29 DIAGNOSIS — Z7984 Long term (current) use of oral hypoglycemic drugs: Secondary | ICD-10-CM | POA: Diagnosis not present

## 2019-04-29 DIAGNOSIS — R1312 Dysphagia, oropharyngeal phase: Secondary | ICD-10-CM | POA: Diagnosis not present

## 2019-04-29 NOTE — Telephone Encounter (Signed)
Left message at carehome

## 2019-04-29 NOTE — Telephone Encounter (Signed)
-----   Message from Hubbard Hartshorn, FNP sent at 04/23/2019  3:33 PM EDT ----- Regarding: Call patient to come in for lab recheck Call patient to come in for lab recheck

## 2019-05-03 ENCOUNTER — Other Ambulatory Visit: Payer: Self-pay

## 2019-05-03 ENCOUNTER — Emergency Department
Admission: EM | Admit: 2019-05-03 | Discharge: 2019-05-03 | Disposition: A | Discharge status: Home / Self Care | Payer: Medicare Other | Attending: Emergency Medicine | Admitting: Emergency Medicine

## 2019-05-03 ENCOUNTER — Encounter: Payer: Self-pay | Admitting: Emergency Medicine

## 2019-05-03 DIAGNOSIS — Y939 Activity, unspecified: Secondary | ICD-10-CM | POA: Insufficient documentation

## 2019-05-03 DIAGNOSIS — I10 Essential (primary) hypertension: Secondary | ICD-10-CM | POA: Diagnosis not present

## 2019-05-03 DIAGNOSIS — S0181XA Laceration without foreign body of other part of head, initial encounter: Secondary | ICD-10-CM | POA: Insufficient documentation

## 2019-05-03 DIAGNOSIS — E119 Type 2 diabetes mellitus without complications: Secondary | ICD-10-CM | POA: Diagnosis not present

## 2019-05-03 DIAGNOSIS — Y999 Unspecified external cause status: Secondary | ICD-10-CM | POA: Diagnosis not present

## 2019-05-03 DIAGNOSIS — Z79899 Other long term (current) drug therapy: Secondary | ICD-10-CM | POA: Diagnosis not present

## 2019-05-03 DIAGNOSIS — Z7984 Long term (current) use of oral hypoglycemic drugs: Secondary | ICD-10-CM | POA: Diagnosis not present

## 2019-05-03 DIAGNOSIS — Y929 Unspecified place or not applicable: Secondary | ICD-10-CM | POA: Insufficient documentation

## 2019-05-03 DIAGNOSIS — W01190A Fall on same level from slipping, tripping and stumbling with subsequent striking against furniture, initial encounter: Secondary | ICD-10-CM | POA: Insufficient documentation

## 2019-05-03 DIAGNOSIS — Z7982 Long term (current) use of aspirin: Secondary | ICD-10-CM | POA: Insufficient documentation

## 2019-05-03 DIAGNOSIS — S01411A Laceration without foreign body of right cheek and temporomandibular area, initial encounter: Secondary | ICD-10-CM | POA: Diagnosis not present

## 2019-05-03 NOTE — ED Provider Notes (Signed)
[facility] Emergency Department Provider Note   ____________________________________________   First MD Initiated Contact with Patient 05/03/19 1111     (approximate)  I have reviewed the triage vital signs and the nursing notes.   HISTORY  Chief Complaint Facial Laceration    HPI Lauren [name] is a 68 y.o. female patient presents with facial laceration secondary to a fall hitting her right side of her head on a dresser.  Patient denies LOC.  Patient that she fell because her legs gave out.  Patient has a history of frequent falls.  Patient is accompanied by her home care provider.  Bleeding is controlled.  Patient denies pain at this time.         Past Medical History:  Diagnosis Date  . Anxiety   . Apnea, sleep 02/02/2014  . Awareness of heartbeats 02/02/2014  . Breathlessness on exertion 02/02/2014  . Diabetes mellitus   . Encounter for pre-employment examination 06/29/2015  . Excessive sweating 07/05/2015  . Fibromyalgia   . GERD (gastroesophageal reflux disease)   . Gravida 1 10/26/2015   1.    . Heart murmur   . Herniated disc   . Hyperlipidemia   . Hypertension   . Itch of skin 10/26/2015  . Lack of bladder control   . Lung tumor   . Rheumatoid arthritis ([place])   . Schizoaffective disorder ([place])   . Screening for cervical cancer 07/29/2017  . Seizure [facility])    after brain surgery 2012. last seizure 2013!  [name] Sex counseling 10/26/2015  . Sleep apnea   . 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 Stiffness of both knees 06/22/2015  . Stroke [facility])     Patient Active Problem List   Diagnosis Date Noted  . 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 SS-A antibody positive 04/15/2019  . AKI (acute kidney injury) ([place]) 04/06/2019  . Slurred speech 03/04/2019  . Chest pain in adult 02/15/2019  . Acute respiratory failure ([place]) 02/15/2019  . Seizure-like activity ([place]) 12/29/2018  . Seizures ([place]) 11/24/2018  .  Paresthesias 11/22/2018  . Mouth dryness 10/12/2018  . Hx of resection of meningioma 07/21/2018  . Tremor 07/21/2018  . Schizoaffective disorder ([place]) 12/29/2017  . Morbid obesity ([place]) 12/09/2017  . Meningioma ([place]) 11/25/2017  . Atherosclerosis of aorta ([place]) 11/25/2017  . Calcification of coronary artery 11/25/2017  . Schizophrenia ([place]) 11/04/2017  . Acid reflux 06/29/2015  . Diabetes mellitus type 2, controlled, without complications ([place]) [id number]  . Cardiac murmur 06/29/2015  . Arthritis of knee, degenerative 06/28/2015  . Insomnia w/ sleep apnea 03/21/2015  . Anxiety disorder 03/21/2015  . Hypertension 03/21/2015  . HLD (hyperlipidemia) 03/21/2015  . Urge incontinence 03/21/2015    Past Surgical History:  Procedure Laterality Date  . BRAIN TUMOR EXCISION  2012   benign  . CARDIAC CATHETERIZATION  2008  . CESAREAN SECTION    . CYST REMOVAL HAND    . LUNG LOBECTOMY  1977   benign tumor  . TOTAL KNEE ARTHROPLASTY Left 01/28/2017   Procedure: TOTAL KNEE ARTHROPLASTY;  Surgeon: Corky Mull, MD;  Location: [facility] ORS;  Service: Orthopedics;  Laterality: Left;    Prior to Admission medications   Medication Sig Start Date End Date Taking? Authorizing Provider  Artificial Saliva (BIOTENE DRY MOUTH MOISTURIZING) SOLN Use as directed 1 application in the mouth or throat 2 (two) times daily. 09/28/18   Hubbard Hartshorn, FNP  Ascorbic Acid 500 MG CHEW Chew  1 tablet by mouth as directed.     [provider]  aspirin EC 81 MG tablet Take 1 tablet (81 mg total) by mouth daily before breakfast. 04/01/18   Ancil Boozer, Drue Stager, MD  atorvastatin (LIPITOR) 40 MG tablet Take 1 tablet (40 mg total) by mouth daily at 6 PM. 03/01/19   Ancil Boozer, Drue Stager, MD  benztropine (COGENTIN) 1 MG tablet Take 1 tablet by mouth 3 (three) times daily. 01/25/19   [provider]  Dentifrices (BIOTENE DRY MOUTH GENTLE) PSTE Place 1 application onto teeth 2 (two) times daily. 09/28/18   Hubbard Hartshorn,  FNP  Elastic Bandages & Supports (KNEE COMPRESSION SLEEVE/L/XL) MISC 2 each by Does not apply route as needed. Wear during day; take off at night 05/12/18   Poulose, Bethel Born, NP  escitalopram (LEXAPRO) 10 MG tablet Take 10 mg by mouth daily.     [provider]  gabapentin (NEURONTIN) 100 MG capsule Take 100 mg by mouth 2 (two) times daily.    [provider]  haloperidol decanoate (HALDOL DECANOATE) 100 MG/ML injection  01/13/19   [provider]  hydrochlorothiazide (HYDRODIURIL) 25 MG tablet Take 1 tablet (25 mg total) by mouth daily. 03/01/19   Steele Sizer, MD  ibuprofen (ADVIL,MOTRIN) 200 MG tablet Take 600 mg by mouth every 8 (eight) hours as needed (for knee pain.).    [provider]  levETIRAcetam (KEPPRA) 500 MG tablet Take 1 tablet by mouth 2 (two) times a day. 01/25/19   [provider]  lisinopril (ZESTRIL) 20 MG tablet Take 1 tablet (20 mg total) by mouth daily. 03/01/19   Steele Sizer, MD  Magnesium Oxide 400 (240 Mg) MG TABS Take 1 tablet (400 mg total) by mouth daily. 04/23/19   Hubbard Hartshorn, FNP  metFORMIN (GLUCOPHAGE) 500 MG tablet Take 1 tablet (500 mg total) by mouth 2 (two) times daily with a meal. 03/01/19   Ancil Boozer, Drue Stager, MD  omeprazole (PRILOSEC) 20 MG capsule Take 1 capsule (20 mg total) by mouth 2 (two) times daily. 12/04/18   Steele Sizer, MD  pilocarpine (SALAGEN) 5 MG tablet Take 1 tablet by mouth 3 (three) times daily. 04/15/19 04/14/20  [provider]  potassium chloride (K-DUR) 10 MEQ tablet Take 1 tablet (10 mEq total) by mouth 2 (two) times daily. 04/23/19   Hubbard Hartshorn, FNP  traZODone (DESYREL) 50 MG tablet Take 1 tablet (50 mg total) by mouth at bedtime. 01/12/18   McNew, Tyson Babinski, MD    Allergies Percocet [oxycodone-acetaminophen], Tramadol hcl, and Vicodin [hydrocodone-acetaminophen]  Family History  Problem Relation Age of Onset  . Heart disease Brother   . Depression Mother   . Heart attack  Mother   . Stroke Mother   . Alcohol abuse Father   . Stroke Father   . Diabetes Sister   . Diabetes Sister   . Stomach cancer Sister   . Kidney disease Sister   . COPD Brother   . Lung cancer Brother   . Diabetes Brother     Social History Social History   Tobacco Use  . Smoking status: Never Smoker  . Smokeless tobacco: Never Used  Substance Use Topics  . Alcohol use: No    Alcohol/week: 0.0 standard drinks  . Drug use: No    Review of Systems  Constitutional: No fever/chills Eyes: No visual changes. ENT: No sore throat. Cardiovascular: Denies chest pain. Respiratory: Denies shortness of breath. Gastrointestinal: No abdominal pain.  No nausea, no vomiting.  No diarrhea.  No constipation. Genitourinary: Negative for dysuria. Musculoskeletal: Negative for back pain. Skin: Negative for rash. Neurological: Negative for headaches, focal weakness or numbness.  History of seizures. Psychiatric:  Anxiety Endocrine:  Diabetes, hyperlipidemia, hypertension. Allergic/Immunilogical: Percocet, tramadol, and Vicodin. ____________________________________________   PHYSICAL EXAM:  VITAL SIGNS: ED Triage Vitals [05/03/19 1106]  Enc Vitals Group     BP 103/65     Pulse Rate 62     Resp 20     Temp 98 F (36.7 C)     Temp src      SpO2 95 %     Weight 217 lb (98.4 kg)     Height 5\' 4"  (1.626 m)     Head Circumference      Peak Flow      Pain Score 0     Pain Loc      Pain Edu?      Excl. in [place]?     Constitutional: Alert and oriented. Well appearing and in no acute distress. Eyes: Conjunctivae are normal. PERRL. EOMI. Head: Atraumatic. Nose: No congestion/rhinnorhea. Mouth/Throat: Mucous membranes are moist.  Oropharynx non-erythematous. Neck: No cervical spine tenderness to palpation. Hematological/Lymphatic/Immunilogical: No cervical lymphadenopathy. Cardiovascular: Normal rate, regular rhythm. Grossly normal heart sounds.  Good peripheral  circulation. Respiratory: Normal respiratory effort.  No retractions. Lungs CTAB. Gastrointestinal: Soft and nontender. No distention. No abdominal bruits. No CVA tenderness. Musculoskeletal: No lower extremity tenderness nor edema.  No joint effusions. Neurologic:  Normal speech and language. No gross focal neurologic deficits are appreciated. No gait instability. Skin:  Skin is warm, dry and intact. No rash noted. Psychiatric: Mood and affect are normal. Speech and behavior are normal.  ____________________________________________   LABS (all labs ordered are listed, but only abnormal results are displayed)  Labs Reviewed - No data to display ____________________________________________  EKG   ____________________________________________  RADIOLOGY  ED MD interpretation:    Official radiology report(s): No results found.  ____________________________________________   PROCEDURES  Procedure(s) performed (including Critical Care):  [name] KitchenMarland KitchenLaceration Repair  Date/Time: 05/03/2019 11:43 AM Performed by: Sable Feil, PA-C Authorized by: Sable Feil, PA-C   Consent:    Consent obtained:  Verbal   Consent given by:  Patient   Risks discussed:  Infection, pain, poor cosmetic result, need for additional repair and poor wound healing Anesthesia (see MAR for exact dosages):    Anesthesia method:  None Laceration details:    Location:  Face   Face location:  R cheek   Length (cm):  1 Repair type:    Repair type:  Simple Pre-procedure details:    Preparation:  Patient was prepped and draped in usual sterile fashion Treatment:    Area cleansed with:  Betadine and saline   Amount of cleaning:  Standard Skin repair:    Repair method:  Tissue adhesive Post-procedure details:    Dressing:  Open (no dressing)   Patient tolerance of procedure:  Tolerated well, no immediate complications     ____________________________________________   INITIAL IMPRESSION /  ASSESSMENT AND PLAN / ED COURSE  As part of my medical decision making, I reviewed the following data within the [place]         Patient presents with facial laceration secondary to witnessed fall.  No LOC.  See procedure note for wound closure.  Patient and caretaker given discharge care instructions.  Return back to ED if wound reopens before Is complete.     ____________________________________________  FINAL CLINICAL IMPRESSION(S) / ED DIAGNOSES  Final diagnoses:  Facial laceration, initial encounter     ED Discharge Orders    None       Note:  This document was prepared using Dragon voice recognition software and may include unintentional dictation errors.    Sable Feil, PA-C 05/03/19 1146    Blake Divine, MD 05/03/19 1525

## 2019-05-03 NOTE — Discharge Instructions (Signed)
Follow discharge care instructions and do not apply any ointments or creams to [place] area.

## 2019-05-03 NOTE — ED Notes (Signed)
See triage note  States her legs gave out and she fell  Hit the right side of head on night stand  Small laceration noted near right ear  No LOC

## 2019-05-03 NOTE — ED Triage Notes (Signed)
Fell at home this am and hit right head.  No loc.  Says got up and her legs gave out.  History of frequesnt falls.

## 2019-05-03 NOTE — ED Triage Notes (Signed)
Pt in via POV from [facility], reports mechanical fall, falling forward and hitting face on night stand, denies LOC.  Pt with laceration just anterior to right ear.  Per Care Home staff, EMS advised a few stiches.

## 2019-05-04 DIAGNOSIS — I10 Essential (primary) hypertension: Secondary | ICD-10-CM | POA: Diagnosis not present

## 2019-05-04 DIAGNOSIS — I2510 Atherosclerotic heart disease of native coronary artery without angina pectoris: Secondary | ICD-10-CM | POA: Diagnosis not present

## 2019-05-04 DIAGNOSIS — E119 Type 2 diabetes mellitus without complications: Secondary | ICD-10-CM | POA: Diagnosis not present

## 2019-05-04 DIAGNOSIS — M069 Rheumatoid arthritis, unspecified: Secondary | ICD-10-CM | POA: Diagnosis not present

## 2019-05-04 DIAGNOSIS — R1312 Dysphagia, oropharyngeal phase: Secondary | ICD-10-CM | POA: Diagnosis not present

## 2019-05-04 DIAGNOSIS — J9621 Acute and chronic respiratory failure with hypoxia: Secondary | ICD-10-CM | POA: Diagnosis not present

## 2019-05-04 DIAGNOSIS — Z96652 Presence of left artificial knee joint: Secondary | ICD-10-CM | POA: Diagnosis not present

## 2019-05-04 DIAGNOSIS — G4700 Insomnia, unspecified: Secondary | ICD-10-CM | POA: Diagnosis not present

## 2019-05-04 DIAGNOSIS — I69328 Other speech and language deficits following cerebral infarction: Secondary | ICD-10-CM | POA: Diagnosis not present

## 2019-05-04 DIAGNOSIS — K219 Gastro-esophageal reflux disease without esophagitis: Secondary | ICD-10-CM | POA: Diagnosis not present

## 2019-05-04 DIAGNOSIS — E785 Hyperlipidemia, unspecified: Secondary | ICD-10-CM | POA: Diagnosis not present

## 2019-05-04 DIAGNOSIS — Z7984 Long term (current) use of oral hypoglycemic drugs: Secondary | ICD-10-CM | POA: Diagnosis not present

## 2019-05-04 DIAGNOSIS — M797 Fibromyalgia: Secondary | ICD-10-CM | POA: Diagnosis not present

## 2019-05-04 DIAGNOSIS — G4730 Sleep apnea, unspecified: Secondary | ICD-10-CM | POA: Diagnosis not present

## 2019-05-04 DIAGNOSIS — Z9181 History of falling: Secondary | ICD-10-CM | POA: Diagnosis not present

## 2019-05-05 DIAGNOSIS — M797 Fibromyalgia: Secondary | ICD-10-CM | POA: Diagnosis not present

## 2019-05-05 DIAGNOSIS — G4700 Insomnia, unspecified: Secondary | ICD-10-CM | POA: Diagnosis not present

## 2019-05-05 DIAGNOSIS — M069 Rheumatoid arthritis, unspecified: Secondary | ICD-10-CM | POA: Diagnosis not present

## 2019-05-05 DIAGNOSIS — J9621 Acute and chronic respiratory failure with hypoxia: Secondary | ICD-10-CM | POA: Diagnosis not present

## 2019-05-05 DIAGNOSIS — I10 Essential (primary) hypertension: Secondary | ICD-10-CM | POA: Diagnosis not present

## 2019-05-05 DIAGNOSIS — I2510 Atherosclerotic heart disease of native coronary artery without angina pectoris: Secondary | ICD-10-CM | POA: Diagnosis not present

## 2019-05-05 DIAGNOSIS — Z7984 Long term (current) use of oral hypoglycemic drugs: Secondary | ICD-10-CM | POA: Diagnosis not present

## 2019-05-05 DIAGNOSIS — I69328 Other speech and language deficits following cerebral infarction: Secondary | ICD-10-CM | POA: Diagnosis not present

## 2019-05-05 DIAGNOSIS — K219 Gastro-esophageal reflux disease without esophagitis: Secondary | ICD-10-CM | POA: Diagnosis not present

## 2019-05-05 DIAGNOSIS — Z96652 Presence of left artificial knee joint: Secondary | ICD-10-CM | POA: Diagnosis not present

## 2019-05-05 DIAGNOSIS — E785 Hyperlipidemia, unspecified: Secondary | ICD-10-CM | POA: Diagnosis not present

## 2019-05-05 DIAGNOSIS — G4730 Sleep apnea, unspecified: Secondary | ICD-10-CM | POA: Diagnosis not present

## 2019-05-05 DIAGNOSIS — E119 Type 2 diabetes mellitus without complications: Secondary | ICD-10-CM | POA: Diagnosis not present

## 2019-05-05 DIAGNOSIS — Z9181 History of falling: Secondary | ICD-10-CM | POA: Diagnosis not present

## 2019-05-05 DIAGNOSIS — R1312 Dysphagia, oropharyngeal phase: Secondary | ICD-10-CM | POA: Diagnosis not present

## 2019-05-06 ENCOUNTER — Telehealth: Payer: Self-pay | Admitting: Family Medicine

## 2019-05-06 DIAGNOSIS — I10 Essential (primary) hypertension: Secondary | ICD-10-CM | POA: Diagnosis not present

## 2019-05-06 DIAGNOSIS — G4730 Sleep apnea, unspecified: Secondary | ICD-10-CM | POA: Diagnosis not present

## 2019-05-06 DIAGNOSIS — M069 Rheumatoid arthritis, unspecified: Secondary | ICD-10-CM | POA: Diagnosis not present

## 2019-05-06 DIAGNOSIS — E119 Type 2 diabetes mellitus without complications: Secondary | ICD-10-CM | POA: Diagnosis not present

## 2019-05-06 DIAGNOSIS — Z7984 Long term (current) use of oral hypoglycemic drugs: Secondary | ICD-10-CM | POA: Diagnosis not present

## 2019-05-06 DIAGNOSIS — G4700 Insomnia, unspecified: Secondary | ICD-10-CM | POA: Diagnosis not present

## 2019-05-06 DIAGNOSIS — I69328 Other speech and language deficits following cerebral infarction: Secondary | ICD-10-CM | POA: Diagnosis not present

## 2019-05-06 DIAGNOSIS — Z96652 Presence of left artificial knee joint: Secondary | ICD-10-CM | POA: Diagnosis not present

## 2019-05-06 DIAGNOSIS — K219 Gastro-esophageal reflux disease without esophagitis: Secondary | ICD-10-CM | POA: Diagnosis not present

## 2019-05-06 DIAGNOSIS — M797 Fibromyalgia: Secondary | ICD-10-CM | POA: Diagnosis not present

## 2019-05-06 DIAGNOSIS — J9621 Acute and chronic respiratory failure with hypoxia: Secondary | ICD-10-CM | POA: Diagnosis not present

## 2019-05-06 DIAGNOSIS — Z9181 History of falling: Secondary | ICD-10-CM | POA: Diagnosis not present

## 2019-05-06 DIAGNOSIS — R1312 Dysphagia, oropharyngeal phase: Secondary | ICD-10-CM | POA: Diagnosis not present

## 2019-05-06 DIAGNOSIS — I2510 Atherosclerotic heart disease of native coronary artery without angina pectoris: Secondary | ICD-10-CM | POA: Diagnosis not present

## 2019-05-06 DIAGNOSIS — E785 Hyperlipidemia, unspecified: Secondary | ICD-10-CM | POA: Diagnosis not present

## 2019-05-06 NOTE — Telephone Encounter (Signed)
Scot Jun from [facility] called stating patient needs a heart monitor sent to [facility] [address], [place] [address] [phone] Patient states she lost heart monitor. Darlene stated she would like a call back for orders on how many times patient needs to check. Patient did have a fall this past Monday 8/24 and did go to ER. She has a cut on her face.  Darlene stated she is okay and would like to make a follow up appt with PCP. Patient call back [phone]

## 2019-05-07 DIAGNOSIS — Z96652 Presence of left artificial knee joint: Secondary | ICD-10-CM | POA: Diagnosis not present

## 2019-05-07 DIAGNOSIS — I2510 Atherosclerotic heart disease of native coronary artery without angina pectoris: Secondary | ICD-10-CM | POA: Diagnosis not present

## 2019-05-07 DIAGNOSIS — Z9181 History of falling: Secondary | ICD-10-CM | POA: Diagnosis not present

## 2019-05-07 DIAGNOSIS — K219 Gastro-esophageal reflux disease without esophagitis: Secondary | ICD-10-CM | POA: Diagnosis not present

## 2019-05-07 DIAGNOSIS — R1312 Dysphagia, oropharyngeal phase: Secondary | ICD-10-CM | POA: Diagnosis not present

## 2019-05-07 DIAGNOSIS — J9621 Acute and chronic respiratory failure with hypoxia: Secondary | ICD-10-CM | POA: Diagnosis not present

## 2019-05-07 DIAGNOSIS — M797 Fibromyalgia: Secondary | ICD-10-CM | POA: Diagnosis not present

## 2019-05-07 DIAGNOSIS — G4730 Sleep apnea, unspecified: Secondary | ICD-10-CM | POA: Diagnosis not present

## 2019-05-07 DIAGNOSIS — G4700 Insomnia, unspecified: Secondary | ICD-10-CM | POA: Diagnosis not present

## 2019-05-07 DIAGNOSIS — E785 Hyperlipidemia, unspecified: Secondary | ICD-10-CM | POA: Diagnosis not present

## 2019-05-07 DIAGNOSIS — Z7984 Long term (current) use of oral hypoglycemic drugs: Secondary | ICD-10-CM | POA: Diagnosis not present

## 2019-05-07 DIAGNOSIS — I69328 Other speech and language deficits following cerebral infarction: Secondary | ICD-10-CM | POA: Diagnosis not present

## 2019-05-07 DIAGNOSIS — E119 Type 2 diabetes mellitus without complications: Secondary | ICD-10-CM | POA: Diagnosis not present

## 2019-05-07 DIAGNOSIS — I10 Essential (primary) hypertension: Secondary | ICD-10-CM | POA: Diagnosis not present

## 2019-05-07 DIAGNOSIS — M069 Rheumatoid arthritis, unspecified: Secondary | ICD-10-CM | POA: Diagnosis not present

## 2019-05-10 DIAGNOSIS — I2510 Atherosclerotic heart disease of native coronary artery without angina pectoris: Secondary | ICD-10-CM | POA: Diagnosis not present

## 2019-05-10 DIAGNOSIS — I10 Essential (primary) hypertension: Secondary | ICD-10-CM | POA: Diagnosis not present

## 2019-05-10 DIAGNOSIS — R1312 Dysphagia, oropharyngeal phase: Secondary | ICD-10-CM | POA: Diagnosis not present

## 2019-05-10 DIAGNOSIS — J9621 Acute and chronic respiratory failure with hypoxia: Secondary | ICD-10-CM | POA: Diagnosis not present

## 2019-05-10 DIAGNOSIS — Z9181 History of falling: Secondary | ICD-10-CM | POA: Diagnosis not present

## 2019-05-10 DIAGNOSIS — I69328 Other speech and language deficits following cerebral infarction: Secondary | ICD-10-CM | POA: Diagnosis not present

## 2019-05-10 DIAGNOSIS — K219 Gastro-esophageal reflux disease without esophagitis: Secondary | ICD-10-CM | POA: Diagnosis not present

## 2019-05-10 DIAGNOSIS — G4700 Insomnia, unspecified: Secondary | ICD-10-CM | POA: Diagnosis not present

## 2019-05-10 DIAGNOSIS — Z96652 Presence of left artificial knee joint: Secondary | ICD-10-CM | POA: Diagnosis not present

## 2019-05-10 DIAGNOSIS — M797 Fibromyalgia: Secondary | ICD-10-CM | POA: Diagnosis not present

## 2019-05-10 DIAGNOSIS — Z7984 Long term (current) use of oral hypoglycemic drugs: Secondary | ICD-10-CM | POA: Diagnosis not present

## 2019-05-10 DIAGNOSIS — G4730 Sleep apnea, unspecified: Secondary | ICD-10-CM | POA: Diagnosis not present

## 2019-05-10 DIAGNOSIS — E785 Hyperlipidemia, unspecified: Secondary | ICD-10-CM | POA: Diagnosis not present

## 2019-05-10 DIAGNOSIS — E119 Type 2 diabetes mellitus without complications: Secondary | ICD-10-CM | POA: Diagnosis not present

## 2019-05-10 DIAGNOSIS — M069 Rheumatoid arthritis, unspecified: Secondary | ICD-10-CM | POA: Diagnosis not present

## 2019-05-11 DIAGNOSIS — R0682 Tachypnea, not elsewhere classified: Secondary | ICD-10-CM | POA: Diagnosis not present

## 2019-05-12 ENCOUNTER — Other Ambulatory Visit: Payer: Self-pay | Admitting: Pulmonary Disease

## 2019-05-12 DIAGNOSIS — I10 Essential (primary) hypertension: Secondary | ICD-10-CM | POA: Diagnosis not present

## 2019-05-12 DIAGNOSIS — Z9181 History of falling: Secondary | ICD-10-CM | POA: Diagnosis not present

## 2019-05-12 DIAGNOSIS — E119 Type 2 diabetes mellitus without complications: Secondary | ICD-10-CM | POA: Diagnosis not present

## 2019-05-12 DIAGNOSIS — Z96652 Presence of left artificial knee joint: Secondary | ICD-10-CM | POA: Diagnosis not present

## 2019-05-12 DIAGNOSIS — K219 Gastro-esophageal reflux disease without esophagitis: Secondary | ICD-10-CM | POA: Diagnosis not present

## 2019-05-12 DIAGNOSIS — G4730 Sleep apnea, unspecified: Secondary | ICD-10-CM | POA: Diagnosis not present

## 2019-05-12 DIAGNOSIS — R1312 Dysphagia, oropharyngeal phase: Secondary | ICD-10-CM | POA: Diagnosis not present

## 2019-05-12 DIAGNOSIS — G4700 Insomnia, unspecified: Secondary | ICD-10-CM | POA: Diagnosis not present

## 2019-05-12 DIAGNOSIS — Z7984 Long term (current) use of oral hypoglycemic drugs: Secondary | ICD-10-CM | POA: Diagnosis not present

## 2019-05-12 DIAGNOSIS — M069 Rheumatoid arthritis, unspecified: Secondary | ICD-10-CM | POA: Diagnosis not present

## 2019-05-12 DIAGNOSIS — I2510 Atherosclerotic heart disease of native coronary artery without angina pectoris: Secondary | ICD-10-CM | POA: Diagnosis not present

## 2019-05-12 DIAGNOSIS — I69328 Other speech and language deficits following cerebral infarction: Secondary | ICD-10-CM | POA: Diagnosis not present

## 2019-05-12 DIAGNOSIS — E785 Hyperlipidemia, unspecified: Secondary | ICD-10-CM | POA: Diagnosis not present

## 2019-05-12 DIAGNOSIS — M797 Fibromyalgia: Secondary | ICD-10-CM | POA: Diagnosis not present

## 2019-05-12 DIAGNOSIS — J9621 Acute and chronic respiratory failure with hypoxia: Secondary | ICD-10-CM | POA: Diagnosis not present

## 2019-05-12 DIAGNOSIS — R0682 Tachypnea, not elsewhere classified: Secondary | ICD-10-CM

## 2019-05-18 ENCOUNTER — Other Ambulatory Visit: Payer: Self-pay | Admitting: Family Medicine

## 2019-05-18 ENCOUNTER — Other Ambulatory Visit
Admission: RE | Admit: 2019-05-18 | Discharge: 2019-05-18 | Disposition: A | Discharge status: Home / Self Care | Payer: Medicare Other | Source: Ambulatory Visit | Attending: Pulmonary Disease | Admitting: Pulmonary Disease

## 2019-05-18 ENCOUNTER — Other Ambulatory Visit: Payer: Self-pay

## 2019-05-18 DIAGNOSIS — E785 Hyperlipidemia, unspecified: Secondary | ICD-10-CM | POA: Diagnosis not present

## 2019-05-18 DIAGNOSIS — Z20828 Contact with and (suspected) exposure to other viral communicable diseases: Secondary | ICD-10-CM | POA: Diagnosis present

## 2019-05-18 DIAGNOSIS — Z01812 Encounter for preprocedural laboratory examination: Secondary | ICD-10-CM | POA: Insufficient documentation

## 2019-05-18 DIAGNOSIS — E1169 Type 2 diabetes mellitus with other specified complication: Secondary | ICD-10-CM

## 2019-05-18 DIAGNOSIS — K219 Gastro-esophageal reflux disease without esophagitis: Secondary | ICD-10-CM | POA: Diagnosis not present

## 2019-05-18 DIAGNOSIS — R1312 Dysphagia, oropharyngeal phase: Secondary | ICD-10-CM | POA: Diagnosis not present

## 2019-05-18 DIAGNOSIS — Z9181 History of falling: Secondary | ICD-10-CM | POA: Diagnosis not present

## 2019-05-18 DIAGNOSIS — E669 Obesity, unspecified: Secondary | ICD-10-CM

## 2019-05-18 DIAGNOSIS — G4730 Sleep apnea, unspecified: Secondary | ICD-10-CM | POA: Diagnosis not present

## 2019-05-18 DIAGNOSIS — M069 Rheumatoid arthritis, unspecified: Secondary | ICD-10-CM | POA: Diagnosis not present

## 2019-05-18 DIAGNOSIS — I69328 Other speech and language deficits following cerebral infarction: Secondary | ICD-10-CM | POA: Diagnosis not present

## 2019-05-18 DIAGNOSIS — G4700 Insomnia, unspecified: Secondary | ICD-10-CM | POA: Diagnosis not present

## 2019-05-18 DIAGNOSIS — Z7984 Long term (current) use of oral hypoglycemic drugs: Secondary | ICD-10-CM | POA: Diagnosis not present

## 2019-05-18 DIAGNOSIS — E119 Type 2 diabetes mellitus without complications: Secondary | ICD-10-CM | POA: Diagnosis not present

## 2019-05-18 DIAGNOSIS — M797 Fibromyalgia: Secondary | ICD-10-CM | POA: Diagnosis not present

## 2019-05-18 DIAGNOSIS — I10 Essential (primary) hypertension: Secondary | ICD-10-CM

## 2019-05-18 DIAGNOSIS — Z96652 Presence of left artificial knee joint: Secondary | ICD-10-CM | POA: Diagnosis not present

## 2019-05-18 DIAGNOSIS — I2510 Atherosclerotic heart disease of native coronary artery without angina pectoris: Secondary | ICD-10-CM | POA: Diagnosis not present

## 2019-05-18 DIAGNOSIS — J9621 Acute and chronic respiratory failure with hypoxia: Secondary | ICD-10-CM | POA: Diagnosis not present

## 2019-05-18 LAB — SARS CORONAVIRUS 2 (TAT 6-24 HRS): SARS Coronavirus 2: NEGATIVE

## 2019-05-18 MED ORDER — HYDROCHLOROTHIAZIDE 25 MG PO TABS: 25.0000 mg | ORAL_TABLET | Freq: Every day | ORAL | 2 refills | Status: DC

## 2019-05-18 MED ORDER — ASCORBIC ACID 500 MG PO CHEW: 1.0000 | CHEWABLE_TABLET | ORAL | 5 refills | Status: DC

## 2019-05-18 MED ORDER — OMEPRAZOLE 20 MG PO CPDR: 20.0000 mg | DELAYED_RELEASE_CAPSULE | Freq: Two times a day (BID) | ORAL | 0 refills | Status: DC

## 2019-05-18 MED ORDER — ATORVASTATIN CALCIUM 40 MG PO TABS: 40.0000 mg | ORAL_TABLET | Freq: Every day | ORAL | 2 refills | Status: DC

## 2019-05-18 MED ORDER — METFORMIN HCL 500 MG PO TABS: 500.0000 mg | ORAL_TABLET | Freq: Two times a day (BID) | ORAL | 2 refills | Status: DC

## 2019-05-18 NOTE — Telephone Encounter (Signed)
Copied from [place] [id number]. Topic: Quick Communication - Rx Refill/Question >> May 18, 2019  9:23 AM [name] wrote: Medication: metFORMIN (GLUCOPHAGE) 500 MG tablet ,escitalopram (LEXAPRO) 10 MG tablet,atorvastatin (LIPITOR) 40 MG tablet,benztropine (COGENTIN) 1 MG tablet,levETIRAcetam (KEPPRA) 500 MG tablet,hydrochlorothiazide (HYDRODIURIL) 25 MG tablet,omeprazole (PRILOSEC) 20 MG capsule,gabapentin (NEURONTIN) 100 MG capsule,aspirin EC 81 MG tablet, Vitamin C (Patients home health nurse called and stated that patient needed a refill on medications.)  Has the patient contacted their pharmacy?Yes (Agent: If no, request that the patient contact the pharmacy for the refill.) (Agent: If yes, when and what did the pharmacy advise?) Contact PCP  Preferred Pharmacy (with phone number or street name): [place], [place] - [place] [phone] (Phone) [phone] (Fax)    Agent: Please be advised that RX refills may take up to 3 business days. We ask that you follow-up with your pharmacy.

## 2019-05-19 ENCOUNTER — Ambulatory Visit: Payer: Medicare Other | Attending: Pulmonary Disease

## 2019-05-19 ENCOUNTER — Other Ambulatory Visit: Payer: Self-pay

## 2019-05-19 DIAGNOSIS — R0682 Tachypnea, not elsewhere classified: Secondary | ICD-10-CM

## 2019-05-19 MED ORDER — ALBUTEROL SULFATE (2.5 MG/3ML) 0.083% IN NEBU
2.5000 mg | INHALATION_SOLUTION | Freq: Once | RESPIRATORY_TRACT | Status: AC
  Administered 2019-05-19: 2.5 mg via RESPIRATORY_TRACT
  Filled 2019-05-19: qty 3

## 2019-05-19 NOTE — Patient Outreach (Signed)
[facility]) Care Management  05/19/2019  Karl Staunton [facility] 05-31-1951 [id number]   Medication Adherence call to Mrs. Flossie Dibble patient is a long term home care and they provide with all her medication.Mrs. Denunzio is showing past due on Lisinopril 20 mg under [place].   [place] Management Direct Dial [phone]  Fax [phone] [email or web address]

## 2019-05-20 DIAGNOSIS — I2510 Atherosclerotic heart disease of native coronary artery without angina pectoris: Secondary | ICD-10-CM | POA: Diagnosis not present

## 2019-05-20 DIAGNOSIS — Z7984 Long term (current) use of oral hypoglycemic drugs: Secondary | ICD-10-CM | POA: Diagnosis not present

## 2019-05-20 DIAGNOSIS — Z96652 Presence of left artificial knee joint: Secondary | ICD-10-CM | POA: Diagnosis not present

## 2019-05-20 DIAGNOSIS — G4730 Sleep apnea, unspecified: Secondary | ICD-10-CM | POA: Diagnosis not present

## 2019-05-20 DIAGNOSIS — R1312 Dysphagia, oropharyngeal phase: Secondary | ICD-10-CM | POA: Diagnosis not present

## 2019-05-20 DIAGNOSIS — M069 Rheumatoid arthritis, unspecified: Secondary | ICD-10-CM | POA: Diagnosis not present

## 2019-05-20 DIAGNOSIS — J9621 Acute and chronic respiratory failure with hypoxia: Secondary | ICD-10-CM | POA: Diagnosis not present

## 2019-05-20 DIAGNOSIS — I69328 Other speech and language deficits following cerebral infarction: Secondary | ICD-10-CM | POA: Diagnosis not present

## 2019-05-20 DIAGNOSIS — K219 Gastro-esophageal reflux disease without esophagitis: Secondary | ICD-10-CM | POA: Diagnosis not present

## 2019-05-20 DIAGNOSIS — I10 Essential (primary) hypertension: Secondary | ICD-10-CM | POA: Diagnosis not present

## 2019-05-20 DIAGNOSIS — M797 Fibromyalgia: Secondary | ICD-10-CM | POA: Diagnosis not present

## 2019-05-20 DIAGNOSIS — Z9181 History of falling: Secondary | ICD-10-CM | POA: Diagnosis not present

## 2019-05-20 DIAGNOSIS — G4700 Insomnia, unspecified: Secondary | ICD-10-CM | POA: Diagnosis not present

## 2019-05-20 DIAGNOSIS — E785 Hyperlipidemia, unspecified: Secondary | ICD-10-CM | POA: Diagnosis not present

## 2019-05-20 DIAGNOSIS — E119 Type 2 diabetes mellitus without complications: Secondary | ICD-10-CM | POA: Diagnosis not present

## 2019-05-24 ENCOUNTER — Other Ambulatory Visit: Payer: Self-pay | Admitting: Family Medicine

## 2019-05-24 DIAGNOSIS — K219 Gastro-esophageal reflux disease without esophagitis: Secondary | ICD-10-CM | POA: Diagnosis not present

## 2019-05-24 DIAGNOSIS — I69328 Other speech and language deficits following cerebral infarction: Secondary | ICD-10-CM | POA: Diagnosis not present

## 2019-05-24 DIAGNOSIS — J9621 Acute and chronic respiratory failure with hypoxia: Secondary | ICD-10-CM | POA: Diagnosis not present

## 2019-05-24 DIAGNOSIS — Z7984 Long term (current) use of oral hypoglycemic drugs: Secondary | ICD-10-CM | POA: Diagnosis not present

## 2019-05-24 DIAGNOSIS — I2510 Atherosclerotic heart disease of native coronary artery without angina pectoris: Secondary | ICD-10-CM | POA: Diagnosis not present

## 2019-05-24 DIAGNOSIS — M797 Fibromyalgia: Secondary | ICD-10-CM | POA: Diagnosis not present

## 2019-05-24 DIAGNOSIS — E119 Type 2 diabetes mellitus without complications: Secondary | ICD-10-CM | POA: Diagnosis not present

## 2019-05-24 DIAGNOSIS — G4730 Sleep apnea, unspecified: Secondary | ICD-10-CM | POA: Diagnosis not present

## 2019-05-24 DIAGNOSIS — I10 Essential (primary) hypertension: Secondary | ICD-10-CM

## 2019-05-24 DIAGNOSIS — G4700 Insomnia, unspecified: Secondary | ICD-10-CM | POA: Diagnosis not present

## 2019-05-24 DIAGNOSIS — Z96652 Presence of left artificial knee joint: Secondary | ICD-10-CM | POA: Diagnosis not present

## 2019-05-24 DIAGNOSIS — Z9181 History of falling: Secondary | ICD-10-CM | POA: Diagnosis not present

## 2019-05-24 DIAGNOSIS — R1312 Dysphagia, oropharyngeal phase: Secondary | ICD-10-CM | POA: Diagnosis not present

## 2019-05-24 DIAGNOSIS — E785 Hyperlipidemia, unspecified: Secondary | ICD-10-CM | POA: Diagnosis not present

## 2019-05-24 DIAGNOSIS — M069 Rheumatoid arthritis, unspecified: Secondary | ICD-10-CM | POA: Diagnosis not present

## 2019-05-24 MED ORDER — ESCITALOPRAM OXALATE 10 MG PO TABS: 10.0000 mg | ORAL_TABLET | Freq: Every day | ORAL | 1 refills | Status: DC

## 2019-05-24 MED ORDER — ASPIRIN EC 81 MG PO TBEC: 81.0000 mg | DELAYED_RELEASE_TABLET | Freq: Every day | ORAL | 5 refills | Status: DC

## 2019-05-24 MED ORDER — GABAPENTIN 100 MG PO CAPS: 100.0000 mg | ORAL_CAPSULE | Freq: Two times a day (BID) | ORAL | 1 refills | Status: DC

## 2019-05-24 MED ORDER — TRAZODONE HCL 50 MG PO TABS: 50.0000 mg | ORAL_TABLET | Freq: Every day | ORAL | 3 refills | Status: DC

## 2019-05-24 MED ORDER — LISINOPRIL 20 MG PO TABS: 20.0000 mg | ORAL_TABLET | Freq: Every day | ORAL | 5 refills | Status: DC

## 2019-05-24 NOTE — Telephone Encounter (Signed)
aspirin EC 81 MG tablet  levETIRAcetam (KEPPRA) 500 MG tablet  escitalopram (LEXAPRO) 10 MG tablet  traZODone (DESYREL) 50 MG tablet  gabapentin (NEURONTIN) 100 MG capsule  lisinopril (ZESTRIL) 20 MG tablet  benztropine (COGENTIN) 1 MG tablet   Send to [place] phone [phone]

## 2019-05-26 ENCOUNTER — Telehealth: Payer: Self-pay

## 2019-05-26 NOTE — Telephone Encounter (Signed)
Copied from [place] [id number]. Topic: Quick Communication - Home Health Verbal Orders >> May 26, 2019  9:07 AM Virl Axe D wrote: Caller/Agency: Wes/PT/[facility] Number: [phone] Requesting OT/PT/Skilled Nursing/Social Work/Speech Therapy: PT Frequency: Extend 1 week 3

## 2019-05-27 NOTE — Telephone Encounter (Signed)
Called Lauren [name] to inform him that orders have been approved.

## 2019-05-31 ENCOUNTER — Other Ambulatory Visit: Payer: Self-pay | Admitting: Neurosurgery

## 2019-05-31 DIAGNOSIS — D329 Benign neoplasm of meninges, unspecified: Secondary | ICD-10-CM

## 2019-06-01 DIAGNOSIS — I10 Essential (primary) hypertension: Secondary | ICD-10-CM | POA: Diagnosis not present

## 2019-06-01 DIAGNOSIS — M069 Rheumatoid arthritis, unspecified: Secondary | ICD-10-CM | POA: Diagnosis not present

## 2019-06-01 DIAGNOSIS — E119 Type 2 diabetes mellitus without complications: Secondary | ICD-10-CM | POA: Diagnosis not present

## 2019-06-01 DIAGNOSIS — I69328 Other speech and language deficits following cerebral infarction: Secondary | ICD-10-CM | POA: Diagnosis not present

## 2019-06-01 DIAGNOSIS — Z9181 History of falling: Secondary | ICD-10-CM | POA: Diagnosis not present

## 2019-06-01 DIAGNOSIS — M519 Unspecified thoracic, thoracolumbar and lumbosacral intervertebral disc disorder: Secondary | ICD-10-CM | POA: Diagnosis not present

## 2019-06-01 DIAGNOSIS — K219 Gastro-esophageal reflux disease without esophagitis: Secondary | ICD-10-CM | POA: Diagnosis not present

## 2019-06-01 DIAGNOSIS — I700 Atherosclerosis of aorta: Secondary | ICD-10-CM | POA: Diagnosis not present

## 2019-06-01 DIAGNOSIS — E785 Hyperlipidemia, unspecified: Secondary | ICD-10-CM | POA: Diagnosis not present

## 2019-06-01 DIAGNOSIS — G4700 Insomnia, unspecified: Secondary | ICD-10-CM | POA: Diagnosis not present

## 2019-06-01 DIAGNOSIS — G4730 Sleep apnea, unspecified: Secondary | ICD-10-CM | POA: Diagnosis not present

## 2019-06-01 DIAGNOSIS — I2510 Atherosclerotic heart disease of native coronary artery without angina pectoris: Secondary | ICD-10-CM | POA: Diagnosis not present

## 2019-06-01 DIAGNOSIS — R1312 Dysphagia, oropharyngeal phase: Secondary | ICD-10-CM | POA: Diagnosis not present

## 2019-06-01 DIAGNOSIS — M797 Fibromyalgia: Secondary | ICD-10-CM | POA: Diagnosis not present

## 2019-06-01 DIAGNOSIS — Z86011 Personal history of benign neoplasm of the brain: Secondary | ICD-10-CM | POA: Diagnosis not present

## 2019-06-01 DIAGNOSIS — Z7984 Long term (current) use of oral hypoglycemic drugs: Secondary | ICD-10-CM | POA: Diagnosis not present

## 2019-06-01 DIAGNOSIS — Z96652 Presence of left artificial knee joint: Secondary | ICD-10-CM | POA: Diagnosis not present

## 2019-06-01 DIAGNOSIS — J9621 Acute and chronic respiratory failure with hypoxia: Secondary | ICD-10-CM | POA: Diagnosis not present

## 2019-06-07 ENCOUNTER — Ambulatory Visit (INDEPENDENT_AMBULATORY_CARE_PROVIDER_SITE_OTHER): Payer: Medicare Other | Admitting: Family Medicine

## 2019-06-07 ENCOUNTER — Encounter: Payer: Self-pay | Admitting: Family Medicine

## 2019-06-07 ENCOUNTER — Other Ambulatory Visit: Payer: Self-pay

## 2019-06-07 VITALS — BP 116/60 | HR 82 | Temp 97.7°F | Ht 64.0 in | Wt 212.0 lb

## 2019-06-07 DIAGNOSIS — I10 Essential (primary) hypertension: Secondary | ICD-10-CM

## 2019-06-07 DIAGNOSIS — E1169 Type 2 diabetes mellitus with other specified complication: Secondary | ICD-10-CM

## 2019-06-07 DIAGNOSIS — E876 Hypokalemia: Secondary | ICD-10-CM | POA: Diagnosis not present

## 2019-06-07 DIAGNOSIS — R251 Tremor, unspecified: Secondary | ICD-10-CM | POA: Diagnosis not present

## 2019-06-07 DIAGNOSIS — E2839 Other primary ovarian failure: Secondary | ICD-10-CM

## 2019-06-07 DIAGNOSIS — I700 Atherosclerosis of aorta: Secondary | ICD-10-CM | POA: Diagnosis not present

## 2019-06-07 DIAGNOSIS — D329 Benign neoplasm of meninges, unspecified: Secondary | ICD-10-CM

## 2019-06-07 DIAGNOSIS — F200 Paranoid schizophrenia: Secondary | ICD-10-CM

## 2019-06-07 DIAGNOSIS — E669 Obesity, unspecified: Secondary | ICD-10-CM

## 2019-06-07 MED ORDER — HYDROCHLOROTHIAZIDE 12.5 MG PO TABS: 12.5000 mg | ORAL_TABLET | Freq: Every day | ORAL | 0 refills | Status: DC

## 2019-06-07 NOTE — Progress Notes (Signed)
Name: Lauren [name]   MRN: [id number]    DOB: May 12, 1951   Date:06/07/2019       Progress Note  Subjective  Chief Complaint  Chief Complaint  Patient presents with   Medication Refill    4 to 6 week F/U   Diabetes   Schizophrenia   Hypertension   Hyperlipidemia    I connected with  Lauren [name]  on 06/07/19 at 10:40 AM EDT by a telephone encounter and verified that I am speaking with the correct person using two identifiers.  I discussed the limitations of evaluation and management by telemedicine and the availability of in person appointments. The patient expressed understanding and agreed to proceed. Staff also discussed with the patient that there may be a patient responsible charge related to this service. Patient Location: at group home where she lives  Provider Location: [facility]   HPI  SOB/Tachypenia: seen by Dr. Lanney Gins and is getting evaluated for a trilogy machine to help her take better breaths .  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  Explained that she cannot stop medication until she discuss it with her psychiatrist, and gets a medication to replace it. She has extra pyramidal effects. She used to go to [facility] but states has not seen psychiatrist lately, but still sees the counselor. Lauren [name] ( she does not recall her last name). She still has tremors and tachypnea and needs to discuss with psychiatrist about her medications. Spoke with a group home   Recurrence of Meningioma: resection done in 2012 with developed of left side numbness, she went to [facility] and CT showed recurrence 01/2018 . She saw Dr. Lacinda Axon but she did not want to have repeat surgery therefore seen by Dr. Baruch Gouty and s/ p radiation.   She denies any recent headacheds. She has intermittent  tremors but is seeing Dr. Melrose Nakayama for that. No change in visual fields.   Aorta atherosclerosis: found on CT chest done Nov 2018. She is on statin and aspirin daily. No side effects of medication    DMII: had labs done while at [facility], taking metforminand last A1C was  6.2%  Eye exam is up to date. She has polyphagia, polydipsia and polyuria.On statin therapy and aspirin . Recheck labs when she returns for labs.  HTN:she is now on lisinopril and HCTZ 25 , bp is at goal, no chest pain or palpitation. Low bp when recently checked at group 116/60. We will decrease dose of hctz 12.5   Morbid obesity: she is trying to eat healthier, more fruit and vegetables. She has been walking outside the group home with PT   Patient Active Problem List   Diagnosis Date Noted   IgA gammopathy 04/26/2019   SS-A antibody positive 04/15/2019   AKI (acute kidney injury) ([place]) 04/06/2019   Slurred speech 03/04/2019   Chest pain in adult 02/15/2019   Acute respiratory failure ([place]) 02/15/2019   Seizure-like activity ([place]) 12/29/2018   Seizures ([place]) 11/24/2018   Paresthesias 11/22/2018   Mouth dryness 10/12/2018   Hx of resection of meningioma 07/21/2018   Tremor 07/21/2018   Schizoaffective disorder ([place] [place]) 12/29/2017   Morbid obesity ([place]) 12/09/2017   Meningioma ([place]) 11/25/2017   Atherosclerosis of aorta ([place]) 11/25/2017   Calcification of coronary artery 11/25/2017   Schizophrenia ([place]) 11/04/2017  Acid reflux 06/29/2015   Diabetes mellitus type 2, controlled, without complications ([place]) XX123456   Cardiac murmur 06/29/2015   Arthritis of knee, degenerative 06/28/2015   Insomnia w/ sleep apnea 03/21/2015   Anxiety disorder 03/21/2015   Hypertension 03/21/2015   HLD (hyperlipidemia) 03/21/2015   Urge incontinence 03/21/2015    Past Surgical History:  Procedure Laterality Date   BRAIN TUMOR EXCISION  2012   benign   CARDIAC CATHETERIZATION  2008   CESAREAN  SECTION     CYST REMOVAL HAND     LUNG LOBECTOMY  1977   benign tumor   TOTAL KNEE ARTHROPLASTY Left 01/28/2017   Procedure: TOTAL KNEE ARTHROPLASTY;  Surgeon: Corky Mull, MD;  Location: [facility] ORS;  Service: Orthopedics;  Laterality: Left;    Family History  Problem Relation Age of Onset   Heart disease Brother    Depression Mother    Heart attack Mother    Stroke Mother    Alcohol abuse Father    Stroke Father    Diabetes Sister    Diabetes Sister    Stomach cancer Sister    Kidney disease Sister    COPD Brother    Lung cancer Brother    Diabetes Brother     Social History   Socioeconomic History   Marital status: Single    Spouse name: Not on file   Number of children: 1   Years of education: Not on file   Highest education level: 12th grade  Occupational History   Not on file  Social Needs   Financial resource strain: Somewhat hard   Food insecurity    Worry: Sometimes true    Inability: Never true   Transportation needs    Medical: No    Non-medical: No  Tobacco Use   Smoking status: Never Smoker   Smokeless tobacco: Never Used  Substance and Sexual Activity   Alcohol use: No    Alcohol/week: 0.0 standard drinks   Drug use: No   Sexual activity: Not Currently  Lifestyle   Physical activity    Days per week: 0 days    Minutes per session: 0 min   Stress: To some extent  Relationships   Social connections    Talks on phone: More than three times a week    Gets together: Three times a week    Attends religious service: More than 4 times per year    Active member of club or organization: No    Attends meetings of clubs or organizations: Never    Relationship status: Married   Intimate partner violence    Fear of current or ex partner: No    Emotionally abused: No    Physically abused: No    Forced sexual activity: No  Other Topics Concern   Not on file  Social History Narrative   Not on file     Current  Outpatient Medications:    Artificial Saliva (BIOTENE DRY MOUTH MOISTURIZING) SOLN, Use as directed 1 application in the mouth or throat 2 (two) times daily., Disp: 44.3 mL, Rfl: 5   Ascorbic Acid 500 MG CHEW, Chew 1 tablet (500 mg total) by mouth as directed., Disp: 60 tablet, Rfl: 5   aspirin EC 81 MG tablet, Take 1 tablet (81 mg total) by mouth daily before breakfast., Disp: 30 tablet, Rfl: 5   atorvastatin (LIPITOR) 40 MG tablet, Take 1 tablet (40 mg total) by mouth daily at 6 PM., Disp: 30 tablet, Rfl: 2  benztropine (COGENTIN) 1 MG tablet, Take 1 tablet by mouth 3 (three) times daily., Disp: , Rfl:    Dentifrices (BIOTENE DRY MOUTH GENTLE) PSTE, Place 1 application onto teeth 2 (two) times daily., Disp: 121.9 g, Rfl: 5   Elastic Bandages & Supports (KNEE COMPRESSION SLEEVE/L/XL) MISC, 2 each by Does not apply route as needed. Wear during day; take off at night, Disp: 2 each, Rfl: 0   escitalopram (LEXAPRO) 10 MG tablet, Take 1 tablet (10 mg total) by mouth daily., Disp: 90 tablet, Rfl: 1   gabapentin (NEURONTIN) 100 MG capsule, Take 1 capsule (100 mg total) by mouth 2 (two) times daily., Disp: 180 capsule, Rfl: 1   haloperidol (HALDOL) 5 MG tablet, , Disp: , Rfl:    haloperidol decanoate (HALDOL DECANOATE) 100 MG/ML injection, , Disp: , Rfl:    hydrochlorothiazide (HYDRODIURIL) 25 MG tablet, Take 1 tablet (25 mg total) by mouth daily., Disp: 30 tablet, Rfl: 2   ibuprofen (ADVIL,MOTRIN) 200 MG tablet, Take 600 mg by mouth every 8 (eight) hours as needed (for knee pain.)., Disp: , Rfl:    levETIRAcetam (KEPPRA) 500 MG tablet, Take 1 tablet by mouth 2 (two) times a day., Disp: , Rfl:    lisinopril (ZESTRIL) 20 MG tablet, Take 1 tablet (20 mg total) by mouth daily., Disp: 30 tablet, Rfl: 5   metFORMIN (GLUCOPHAGE) 500 MG tablet, Take 1 tablet (500 mg total) by mouth 2 (two) times daily with a meal., Disp: 30 tablet, Rfl: 2   omeprazole (PRILOSEC) 20 MG capsule, Take 1 capsule (20  mg total) by mouth 2 (two) times daily., Disp: 180 capsule, Rfl: 0   pilocarpine (SALAGEN) 5 MG tablet, Take 1 tablet by mouth 3 (three) times daily., Disp: , Rfl:    potassium chloride (K-DUR) 10 MEQ tablet, Take 1 tablet (10 mEq total) by mouth 2 (two) times daily., Disp: 8 tablet, Rfl: 0   traZODone (DESYREL) 50 MG tablet, Take 1 tablet (50 mg total) by mouth at bedtime., Disp: 30 tablet, Rfl: 3   Magnesium Oxide 400 (240 Mg) MG TABS, Take 1 tablet (400 mg total) by mouth daily. (Patient not taking: Reported on 06/07/2019), Disp: 4 tablet, Rfl: 0  Allergies  Allergen Reactions   Percocet [Oxycodone-Acetaminophen] Diarrhea, Nausea And Vomiting and Nausea Only   Tramadol Hcl Diarrhea, Nausea And Vomiting and Nausea Only   Vicodin [Hydrocodone-Acetaminophen] Diarrhea, Nausea And Vomiting and Nausea Only    I personally reviewed active problem list, medication list, allergies, family history, social history, health maintenance with the patient/caregiver today.   ROS  Ten systems reviewed and is negative except as mentioned in HPI   Objective  Virtual encounter, vitals obtained at group home  Vitals:   06/07/19 0847  BP: 116/60  Pulse: 82  Temp: 97.7 F (36.5 C)  SpO2: 98%    Body mass index is 36.39 kg/m.  Physical Exam  Awake, alert , cooperative, it was a phone call and she had tachypnea, speaking in short sentences   PHQ2/9: Depression screen [facility] 2/9 06/07/2019 04/22/2019 02/26/2019 12/04/2018 10/22/2018  Decreased Interest 0 0 1 0 0  Down, Depressed, Hopeless 0 0 1 0 0  PHQ - 2 Score 0 0 2 0 0  Altered sleeping 0 0 1 0 0  Tired, decreased energy 0 0 2 3 3   Change in appetite 0 0 3 1 1   Feeling bad or failure about yourself  0 0 0 0 0  Trouble concentrating 0 0 0 0  3  Moving slowly or fidgety/restless 0 0 0 1 1  Suicidal thoughts 0 0 0 0 0  PHQ-9 Score 0 0 8 5 8   Difficult doing work/chores Not difficult at all Not difficult at all Somewhat difficult - Not  difficult at all  Some recent data might be hidden   PHQ-2/9 Result is negative.    Fall Risk: Fall Risk  06/07/2019 04/22/2019 02/26/2019 12/04/2018 10/22/2018  Falls in the past year? 0 0 0 1 1  Number falls in past yr: 0 0 0 1 1  Injury with Fall? 0 0 0 0 0  Risk for fall due to : - - - - History of fall(s)  Follow up - Falls evaluation completed - - Falls prevention discussed     Assessment & Plan  1. Hypokalemia  - Magnesium  2. Meningioma ([place])  Refuses resection   3. Tremor   4. Atherosclerosis of aorta (HCC)  - Lipid panel  5. Morbid obesity ([place])  Discussed with the patient the risk posed by an increased BMI. Discussed importance of portion control, calorie counting and at least 150 minutes of physical activity weekly. Avoid sweet beverages and drink more water. Eat at least 6 servings of fruit and vegetables daily   6. Diabetes mellitus type 2 in obese (HCC)  - Hemoglobin A1c  7. Essential hypertension  - COMPLETE METABOLIC PANEL WITH GFR - hydrochlorothiazide (HYDRODIURIL) 12.5 MG tablet; Take 1 tablet (12.5 mg total) by mouth daily. New dose, stop hctz 25 mg  Dispense: 90 tablet; Refill: 0  8. Paranoid schizophrenia ([place])  Needs to follow up with psychiatrist at [facility], spoke to caregiver at the group home about it today   9. Tremor of both hands  Likely extra-pyramidal effects  I discussed the assessment and treatment plan with the patient. The patient was provided an opportunity to ask questions and all were answered. The patient agreed with the plan and demonstrated an understanding of the instructions.  The patient was advised to call back or seek an in-person evaluation if the symptoms worsen or if the condition fails to improve as anticipated.  I provided 25  minutes of non-face-to-face time during this encounter.

## 2019-06-08 DIAGNOSIS — Z7984 Long term (current) use of oral hypoglycemic drugs: Secondary | ICD-10-CM | POA: Diagnosis not present

## 2019-06-08 DIAGNOSIS — Z96652 Presence of left artificial knee joint: Secondary | ICD-10-CM | POA: Diagnosis not present

## 2019-06-08 DIAGNOSIS — G4700 Insomnia, unspecified: Secondary | ICD-10-CM | POA: Diagnosis not present

## 2019-06-08 DIAGNOSIS — I700 Atherosclerosis of aorta: Secondary | ICD-10-CM | POA: Diagnosis not present

## 2019-06-08 DIAGNOSIS — K219 Gastro-esophageal reflux disease without esophagitis: Secondary | ICD-10-CM | POA: Diagnosis not present

## 2019-06-08 DIAGNOSIS — R1312 Dysphagia, oropharyngeal phase: Secondary | ICD-10-CM | POA: Diagnosis not present

## 2019-06-08 DIAGNOSIS — M797 Fibromyalgia: Secondary | ICD-10-CM | POA: Diagnosis not present

## 2019-06-08 DIAGNOSIS — M519 Unspecified thoracic, thoracolumbar and lumbosacral intervertebral disc disorder: Secondary | ICD-10-CM | POA: Diagnosis not present

## 2019-06-08 DIAGNOSIS — E119 Type 2 diabetes mellitus without complications: Secondary | ICD-10-CM | POA: Diagnosis not present

## 2019-06-08 DIAGNOSIS — J9621 Acute and chronic respiratory failure with hypoxia: Secondary | ICD-10-CM | POA: Diagnosis not present

## 2019-06-08 DIAGNOSIS — Z9181 History of falling: Secondary | ICD-10-CM | POA: Diagnosis not present

## 2019-06-08 DIAGNOSIS — G4730 Sleep apnea, unspecified: Secondary | ICD-10-CM | POA: Diagnosis not present

## 2019-06-08 DIAGNOSIS — M069 Rheumatoid arthritis, unspecified: Secondary | ICD-10-CM | POA: Diagnosis not present

## 2019-06-08 DIAGNOSIS — E785 Hyperlipidemia, unspecified: Secondary | ICD-10-CM | POA: Diagnosis not present

## 2019-06-08 DIAGNOSIS — I69328 Other speech and language deficits following cerebral infarction: Secondary | ICD-10-CM | POA: Diagnosis not present

## 2019-06-08 DIAGNOSIS — I2510 Atherosclerotic heart disease of native coronary artery without angina pectoris: Secondary | ICD-10-CM | POA: Diagnosis not present

## 2019-06-08 DIAGNOSIS — Z86011 Personal history of benign neoplasm of the brain: Secondary | ICD-10-CM | POA: Diagnosis not present

## 2019-06-08 DIAGNOSIS — I10 Essential (primary) hypertension: Secondary | ICD-10-CM | POA: Diagnosis not present

## 2019-06-09 ENCOUNTER — Other Ambulatory Visit: Payer: Self-pay

## 2019-06-09 ENCOUNTER — Ambulatory Visit (INDEPENDENT_AMBULATORY_CARE_PROVIDER_SITE_OTHER): Payer: Medicare Other

## 2019-06-09 DIAGNOSIS — I10 Essential (primary) hypertension: Secondary | ICD-10-CM | POA: Diagnosis not present

## 2019-06-09 DIAGNOSIS — E876 Hypokalemia: Secondary | ICD-10-CM | POA: Diagnosis not present

## 2019-06-09 DIAGNOSIS — E1169 Type 2 diabetes mellitus with other specified complication: Secondary | ICD-10-CM | POA: Diagnosis not present

## 2019-06-09 DIAGNOSIS — I700 Atherosclerosis of aorta: Secondary | ICD-10-CM | POA: Diagnosis not present

## 2019-06-09 DIAGNOSIS — Z23 Encounter for immunization: Secondary | ICD-10-CM

## 2019-06-09 MED ORDER — BLOOD GLUCOSE MONITOR KIT: PACK | 0 refills | Status: DC

## 2019-06-09 NOTE — Telephone Encounter (Signed)
Copied from [place] [id number]. Topic: General - Other >> Jun 09, 2019  3:13 PM Yvette Rack wrote: Reason for CRM: Lyn with [facility] requests prescriptions for all pt diabetic supplies: monitor, test strips, lancets, etc. Cb# [phone] fax# [phone]

## 2019-06-10 ENCOUNTER — Other Ambulatory Visit: Payer: Self-pay | Admitting: Family Medicine

## 2019-06-10 ENCOUNTER — Telehealth: Payer: Self-pay | Admitting: Family Medicine

## 2019-06-10 DIAGNOSIS — E876 Hypokalemia: Secondary | ICD-10-CM

## 2019-06-10 LAB — LIPID PANEL
Cholesterol: 147 mg/dL (ref <200)
HDL: 40 mg/dL — ABNORMAL LOW (ref >=50)
LDL Cholesterol (Calc): 86 mg/dL (calc)
Non-HDL Cholesterol (Calc): 107 mg/dL (calc) (ref <130)
Total CHOL/HDL Ratio: 3.7 (calc) (ref <5.0)
Triglycerides: 115 mg/dL (ref <150)

## 2019-06-10 LAB — COMPLETE METABOLIC PANEL WITH GFR
AG Ratio: 1.4 (calc) (ref 1.0–2.5)
ALT: 10 U/L (ref 6–29)
AST: 15 U/L (ref 10–35)
Albumin: 4 g/dL (ref 3.6–5.1)
Alkaline phosphatase (APISO): 61 U/L (ref 37–153)
BUN: 10 mg/dL (ref 7–25)
CO2: 21 mmol/L (ref 20–32)
Calcium: 10 mg/dL (ref 8.6–10.4)
Chloride: 105 mmol/L (ref 98–110)
Creat: 0.7 mg/dL (ref 0.50–0.99)
GFR, Est African American: 105 mL/min/{1.73_m2} (ref >=60)
GFR, Est Non African American: 90 mL/min/{1.73_m2} (ref >=60)
Globulin: 2.8 g/dL (calc) (ref 1.9–3.7)
Glucose, Bld: 106 mg/dL — ABNORMAL HIGH (ref 65–99)
Potassium: 3.2 mmol/L — ABNORMAL LOW (ref 3.5–5.3)
Sodium: 140 mmol/L (ref 135–146)
Total Bilirubin: 0.6 mg/dL (ref 0.2–1.2)
Total Protein: 6.8 g/dL (ref 6.1–8.1)

## 2019-06-10 LAB — MAGNESIUM: Magnesium: 1.3 mg/dL — ABNORMAL LOW (ref 1.5–2.5)

## 2019-06-10 LAB — HEMOGLOBIN A1C
Hgb A1c MFr Bld: 5.9 % of total Hgb — ABNORMAL HIGH (ref <5.7)
Mean Plasma Glucose: 123 (calc)
eAG (mmol/L): 6.8 (calc)

## 2019-06-10 MED ORDER — MAGNESIUM OXIDE 420 MG PO TABS: 1.0000 | ORAL_TABLET | Freq: Two times a day (BID) | ORAL | 0 refills | Status: DC

## 2019-06-10 MED ORDER — POTASSIUM CHLORIDE ER 10 MEQ PO TBCR: 10.0000 meq | EXTENDED_RELEASE_TABLET | Freq: Two times a day (BID) | ORAL | 0 refills | Status: DC

## 2019-06-10 NOTE — Telephone Encounter (Signed)
Pharmacy stated they only have 400MG  or 500MG  for Magnesium Oxide. Rx written for 420MG . Please advise.  Magnesium Oxide [place], [place] [phone] (Phone) [phone] (Fax)

## 2019-06-11 MED ORDER — MAGNESIUM 400 MG PO CAPS: 1.0000 | ORAL_CAPSULE | Freq: Two times a day (BID) | ORAL | 2 refills | Status: DC

## 2019-06-14 DIAGNOSIS — R1312 Dysphagia, oropharyngeal phase: Secondary | ICD-10-CM | POA: Diagnosis not present

## 2019-06-14 DIAGNOSIS — I69328 Other speech and language deficits following cerebral infarction: Secondary | ICD-10-CM | POA: Diagnosis not present

## 2019-06-14 DIAGNOSIS — E119 Type 2 diabetes mellitus without complications: Secondary | ICD-10-CM | POA: Diagnosis not present

## 2019-06-14 DIAGNOSIS — G4730 Sleep apnea, unspecified: Secondary | ICD-10-CM | POA: Diagnosis not present

## 2019-06-14 DIAGNOSIS — G4700 Insomnia, unspecified: Secondary | ICD-10-CM | POA: Diagnosis not present

## 2019-06-14 DIAGNOSIS — Z7984 Long term (current) use of oral hypoglycemic drugs: Secondary | ICD-10-CM | POA: Diagnosis not present

## 2019-06-14 DIAGNOSIS — E785 Hyperlipidemia, unspecified: Secondary | ICD-10-CM | POA: Diagnosis not present

## 2019-06-14 DIAGNOSIS — M069 Rheumatoid arthritis, unspecified: Secondary | ICD-10-CM | POA: Diagnosis not present

## 2019-06-14 DIAGNOSIS — Z9181 History of falling: Secondary | ICD-10-CM | POA: Diagnosis not present

## 2019-06-14 DIAGNOSIS — I10 Essential (primary) hypertension: Secondary | ICD-10-CM | POA: Diagnosis not present

## 2019-06-14 DIAGNOSIS — I700 Atherosclerosis of aorta: Secondary | ICD-10-CM | POA: Diagnosis not present

## 2019-06-14 DIAGNOSIS — J9621 Acute and chronic respiratory failure with hypoxia: Secondary | ICD-10-CM | POA: Diagnosis not present

## 2019-06-14 DIAGNOSIS — K219 Gastro-esophageal reflux disease without esophagitis: Secondary | ICD-10-CM | POA: Diagnosis not present

## 2019-06-14 DIAGNOSIS — Z96652 Presence of left artificial knee joint: Secondary | ICD-10-CM | POA: Diagnosis not present

## 2019-06-14 DIAGNOSIS — M797 Fibromyalgia: Secondary | ICD-10-CM | POA: Diagnosis not present

## 2019-06-14 DIAGNOSIS — M519 Unspecified thoracic, thoracolumbar and lumbosacral intervertebral disc disorder: Secondary | ICD-10-CM | POA: Diagnosis not present

## 2019-06-14 DIAGNOSIS — Z86011 Personal history of benign neoplasm of the brain: Secondary | ICD-10-CM | POA: Diagnosis not present

## 2019-06-14 DIAGNOSIS — I2510 Atherosclerotic heart disease of native coronary artery without angina pectoris: Secondary | ICD-10-CM | POA: Diagnosis not present

## 2019-06-16 ENCOUNTER — Ambulatory Visit: Payer: Medicare Other

## 2019-06-24 ENCOUNTER — Other Ambulatory Visit: Payer: Self-pay

## 2019-06-24 ENCOUNTER — Encounter: Payer: Self-pay | Admitting: Emergency Medicine

## 2019-06-24 ENCOUNTER — Emergency Department
Admission: EM | Admit: 2019-06-24 | Discharge: 2019-07-20 | Disposition: A | Discharge status: Home / Self Care | Payer: Medicare Other | Attending: Emergency Medicine | Admitting: Emergency Medicine

## 2019-06-24 DIAGNOSIS — E876 Hypokalemia: Secondary | ICD-10-CM

## 2019-06-24 DIAGNOSIS — Z20828 Contact with and (suspected) exposure to other viral communicable diseases: Secondary | ICD-10-CM | POA: Diagnosis not present

## 2019-06-24 DIAGNOSIS — E1169 Type 2 diabetes mellitus with other specified complication: Secondary | ICD-10-CM

## 2019-06-24 DIAGNOSIS — E785 Hyperlipidemia, unspecified: Secondary | ICD-10-CM | POA: Insufficient documentation

## 2019-06-24 DIAGNOSIS — Z79899 Other long term (current) drug therapy: Secondary | ICD-10-CM | POA: Diagnosis not present

## 2019-06-24 DIAGNOSIS — Z7984 Long term (current) use of oral hypoglycemic drugs: Secondary | ICD-10-CM | POA: Diagnosis not present

## 2019-06-24 DIAGNOSIS — Z885 Allergy status to narcotic agent status: Secondary | ICD-10-CM | POA: Diagnosis not present

## 2019-06-24 DIAGNOSIS — Z8673 Personal history of transient ischemic attack (TIA), and cerebral infarction without residual deficits: Secondary | ICD-10-CM | POA: Diagnosis not present

## 2019-06-24 DIAGNOSIS — E669 Obesity, unspecified: Secondary | ICD-10-CM

## 2019-06-24 DIAGNOSIS — F250 Schizoaffective disorder, bipolar type: Secondary | ICD-10-CM | POA: Diagnosis not present

## 2019-06-24 DIAGNOSIS — Z7982 Long term (current) use of aspirin: Secondary | ICD-10-CM | POA: Diagnosis not present

## 2019-06-24 DIAGNOSIS — I2510 Atherosclerotic heart disease of native coronary artery without angina pectoris: Secondary | ICD-10-CM | POA: Diagnosis not present

## 2019-06-24 DIAGNOSIS — E119 Type 2 diabetes mellitus without complications: Secondary | ICD-10-CM | POA: Diagnosis not present

## 2019-06-24 DIAGNOSIS — M6281 Muscle weakness (generalized): Secondary | ICD-10-CM | POA: Diagnosis not present

## 2019-06-24 DIAGNOSIS — R0682 Tachypnea, not elsewhere classified: Secondary | ICD-10-CM | POA: Diagnosis not present

## 2019-06-24 DIAGNOSIS — G4730 Sleep apnea, unspecified: Secondary | ICD-10-CM | POA: Diagnosis not present

## 2019-06-24 DIAGNOSIS — Z8249 Family history of ischemic heart disease and other diseases of the circulatory system: Secondary | ICD-10-CM | POA: Insufficient documentation

## 2019-06-24 DIAGNOSIS — M069 Rheumatoid arthritis, unspecified: Secondary | ICD-10-CM | POA: Insufficient documentation

## 2019-06-24 DIAGNOSIS — Z0000 Encounter for general adult medical examination without abnormal findings: Secondary | ICD-10-CM

## 2019-06-24 DIAGNOSIS — Z03818 Encounter for observation for suspected exposure to other biological agents ruled out: Secondary | ICD-10-CM | POA: Diagnosis not present

## 2019-06-24 DIAGNOSIS — I10 Essential (primary) hypertension: Secondary | ICD-10-CM

## 2019-06-24 LAB — BASIC METABOLIC PANEL
Anion gap: 6 (ref 5–15)
BUN: 14 mg/dL (ref 8–23)
CO2: 22 mmol/L (ref 22–32)
Calcium: 9.5 mg/dL (ref 8.9–10.3)
Chloride: 111 mmol/L (ref 98–111)
Creatinine, Ser: 0.81 mg/dL (ref 0.44–1.00)
GFR calc Af Amer: >60 mL/min (ref >60)
GFR calc non Af Amer: >60 mL/min (ref >60)
Glucose, Bld: 93 mg/dL (ref 70–99)
Potassium: 2.7 mmol/L — CL (ref 3.5–5.1)
Sodium: 139 mmol/L (ref 135–145)

## 2019-06-24 LAB — URINALYSIS, COMPLETE (UACMP) WITH MICROSCOPIC
Bilirubin Urine: NEGATIVE
Glucose, UA: NEGATIVE mg/dL
Hgb urine dipstick: NEGATIVE
Ketones, ur: 20 mg/dL — AB
Leukocytes,Ua: NEGATIVE
Nitrite: NEGATIVE
Protein, ur: NEGATIVE mg/dL
Specific Gravity, Urine: 1.017 (ref 1.005–1.030)
pH: 7 (ref 5.0–8.0)

## 2019-06-24 LAB — CBC WITH DIFFERENTIAL/PLATELET
Abs Immature Granulocytes: 0.02 10*3/uL (ref 0.00–0.07)
Basophils Absolute: 0.1 10*3/uL (ref 0.0–0.1)
Basophils Relative: 1 %
Eosinophils Absolute: 0 10*3/uL (ref 0.0–0.5)
Eosinophils Relative: 0 %
HCT: 31.4 % — ABNORMAL LOW (ref 36.0–46.0)
Hemoglobin: 10.8 g/dL — ABNORMAL LOW (ref 12.0–15.0)
Immature Granulocytes: 0 %
Lymphocytes Relative: 39 %
Lymphs Abs: 3 10*3/uL (ref 0.7–4.0)
MCH: 32.1 pg (ref 26.0–34.0)
MCHC: 34.4 g/dL (ref 30.0–36.0)
MCV: 93.5 fL (ref 80.0–100.0)
Monocytes Absolute: 0.6 10*3/uL (ref 0.1–1.0)
Monocytes Relative: 8 %
Neutro Abs: 4 10*3/uL (ref 1.7–7.7)
Neutrophils Relative %: 52 %
Platelets: 283 10*3/uL (ref 150–400)
RBC: 3.36 MIL/uL — ABNORMAL LOW (ref 3.87–5.11)
RDW: 13.2 % (ref 11.5–15.5)
WBC: 7.7 10*3/uL (ref 4.0–10.5)
nRBC: 0 % (ref 0.0–0.2)

## 2019-06-24 MED ORDER — PILOCARPINE HCL 5 MG PO TABS
5.0000 mg | ORAL_TABLET | Freq: Three times a day (TID) | ORAL | Status: DC
  Administered 2019-06-25 – 2019-07-20 (×69): 5 mg via ORAL
  Filled 2019-06-24 (×92): qty 1

## 2019-06-24 MED ORDER — POTASSIUM CHLORIDE CRYS ER 20 MEQ PO TBCR
10.0000 meq | EXTENDED_RELEASE_TABLET | Freq: Two times a day (BID) | ORAL | Status: DC
  Administered 2019-06-25 – 2019-07-20 (×52): 10 meq via ORAL
  Filled 2019-06-24 (×54): qty 1

## 2019-06-24 MED ORDER — TRAZODONE HCL 50 MG PO TABS
50.0000 mg | ORAL_TABLET | Freq: Every day | ORAL | Status: DC
  Administered 2019-06-24 – 2019-07-19 (×25): 50 mg via ORAL
  Filled 2019-06-24 (×27): qty 1

## 2019-06-24 MED ORDER — IBUPROFEN 400 MG PO TABS
400.0000 mg | ORAL_TABLET | Freq: Once | ORAL | Status: AC
  Administered 2019-06-24: 400 mg via ORAL
  Filled 2019-06-24: qty 1

## 2019-06-24 MED ORDER — ESCITALOPRAM OXALATE 10 MG PO TABS
10.0000 mg | ORAL_TABLET | Freq: Every day | ORAL | Status: DC
  Administered 2019-06-25 – 2019-07-20 (×26): 10 mg via ORAL
  Filled 2019-06-24 (×31): qty 1

## 2019-06-24 MED ORDER — LEVETIRACETAM 500 MG PO TABS
500.0000 mg | ORAL_TABLET | Freq: Two times a day (BID) | ORAL | Status: DC
  Administered 2019-06-24 – 2019-07-20 (×52): 500 mg via ORAL
  Filled 2019-06-24 (×60): qty 1

## 2019-06-24 MED ORDER — PANTOPRAZOLE SODIUM 40 MG PO TBEC
40.0000 mg | DELAYED_RELEASE_TABLET | Freq: Every day | ORAL | Status: DC
  Administered 2019-06-24 – 2019-07-20 (×27): 40 mg via ORAL
  Filled 2019-06-24 (×28): qty 1

## 2019-06-24 MED ORDER — LISINOPRIL 10 MG PO TABS
20.0000 mg | ORAL_TABLET | Freq: Every day | ORAL | Status: DC
  Administered 2019-06-25 – 2019-07-20 (×26): 20 mg via ORAL
  Filled 2019-06-24 (×26): qty 2

## 2019-06-24 MED ORDER — GABAPENTIN 100 MG PO CAPS
100.0000 mg | ORAL_CAPSULE | Freq: Two times a day (BID) | ORAL | Status: DC
  Administered 2019-06-24 – 2019-07-20 (×51): 100 mg via ORAL
  Filled 2019-06-24 (×59): qty 1

## 2019-06-24 MED ORDER — ASPIRIN EC 81 MG PO TBEC
81.0000 mg | DELAYED_RELEASE_TABLET | Freq: Every day | ORAL | Status: DC
  Administered 2019-06-25 – 2019-07-20 (×26): 81 mg via ORAL
  Filled 2019-06-24 (×35): qty 1

## 2019-06-24 MED ORDER — POTASSIUM CHLORIDE CRYS ER 20 MEQ PO TBCR
40.0000 meq | EXTENDED_RELEASE_TABLET | Freq: Once | ORAL | Status: AC
  Administered 2019-06-24: 40 meq via ORAL
  Filled 2019-06-24: qty 2

## 2019-06-24 MED ORDER — METFORMIN HCL 500 MG PO TABS
500.0000 mg | ORAL_TABLET | Freq: Two times a day (BID) | ORAL | Status: DC
  Administered 2019-06-25 – 2019-07-20 (×50): 500 mg via ORAL
  Filled 2019-06-24 (×58): qty 1

## 2019-06-24 MED ORDER — HYDROCHLOROTHIAZIDE 25 MG PO TABS
12.5000 mg | ORAL_TABLET | Freq: Every day | ORAL | Status: DC
  Administered 2019-06-25 – 2019-07-20 (×26): 12.5 mg via ORAL
  Filled 2019-06-24 (×2): qty 1
  Filled 2019-06-24: qty 0.5
  Filled 2019-06-24 (×6): qty 1
  Filled 2019-06-24 (×2): qty 0.5
  Filled 2019-06-24 (×7): qty 1
  Filled 2019-06-24: qty 0.5
  Filled 2019-06-24 (×2): qty 1
  Filled 2019-06-24 (×2): qty 0.5
  Filled 2019-06-24 (×5): qty 1
  Filled 2019-06-24: qty 0.5
  Filled 2019-06-24 (×2): qty 1

## 2019-06-24 MED ORDER — BENZTROPINE MESYLATE 1 MG PO TABS
1.0000 mg | ORAL_TABLET | Freq: Three times a day (TID) | ORAL | Status: DC
  Administered 2019-06-24 – 2019-07-20 (×74): 1 mg via ORAL
  Filled 2019-06-24 (×82): qty 1

## 2019-06-24 MED ORDER — ATORVASTATIN CALCIUM 20 MG PO TABS
40.0000 mg | ORAL_TABLET | Freq: Every day | ORAL | Status: DC
  Administered 2019-06-26 – 2019-07-19 (×23): 40 mg via ORAL
  Filled 2019-06-24 (×27): qty 2

## 2019-06-24 MED ORDER — VITAMIN C 500 MG PO TABS: 500.0000 mg | ORAL_TABLET | ORAL | Status: DC
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
## 2019-06-24 NOTE — ED Notes (Signed)
[name] with [address] called. Said that pt could return to group home. Said that pt would not want to but that she could. [name] said that they had had DSS involved prior but that DSS had sighed off. Ivin Booty [place], [place]

## 2019-06-24 NOTE — ED Notes (Signed)
Group home called at this time, coming to get pt.

## 2019-06-24 NOTE — Discharge Instructions (Signed)
Thank you for letting us take care of you in the emergency department today.   Please continue to take any regular, prescribed medications.   Please return to the ER for any new or worsening symptoms.

## 2019-06-24 NOTE — ED Notes (Signed)
FIRST NURSE NOTE: pt brought into the ED voluntarily by [employer]. States the pt wanted to go to [facility] but they were booked up today and referred the pt to the ED. Pt is from a group home at [facility], [employer] owner [phone]

## 2019-06-24 NOTE — ED Notes (Signed)
Patient was wheeled to group [facility], refused to get in. Patient feels she is being mistreated by staff, staff making patient clearly agitated. After much discussion, patient will board tonight for placement

## 2019-06-24 NOTE — ED Notes (Signed)
pts sister, thelma clark, updated on pt being discharged back to group home.

## 2019-06-24 NOTE — ED Provider Notes (Signed)
See prior note for full details.   Patient had agreed to discharge as noted. Apparently when she got to her ride's care she then adamantly refused to get in the car and refused to return to the group home.   We will place another social work consult for re-evaluation in the morning. Will order her home medications.   Lilia Pro., MD 06/24/19 2040

## 2019-06-24 NOTE — ED Notes (Signed)
Pt requesting medication for pain> MD notified and pharmacy called to verify pt's home medications
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
## 2019-06-25 DIAGNOSIS — F250 Schizoaffective disorder, bipolar type: Secondary | ICD-10-CM

## 2019-06-25 LAB — SARS CORONAVIRUS 2 (TAT 6-24 HRS): SARS Coronavirus 2: NEGATIVE

## 2019-06-25 NOTE — ED Notes (Signed)
This RN sent message to pharmacy for due medications.

## 2019-06-25 NOTE — ED Notes (Signed)
Pt ambulatory to toilet with the assistance of a cane. This Rn at bedside.

## 2019-06-25 NOTE — TOC Progression Note (Signed)
Transition of Care [facility]) - Progression Note    Patient Details  Name: Lauren [name] MRN: [id number] Date of Birth: [birth year]  Transition of Care [facility]) CM/SW Contact  Katrina Stack, RN Phone Number: 06/25/2019, 12:15 PM  Clinical Narrative:  Spoke with Owner of [facility] home.  Patient has been resident for 3 months.  During that times she has wanted to stay and wanted to leave many times. Her sisters are unable to have patient live in their  homes due to their own health issues and patient does not follow rules or respect them. She lived a short time in a boarding house with her son and he is now in a family care home.  Patient wanted to go to that facility but was denied.  She has been homeless many times.  She receives 1400 -1500 dollars a month.  She is essentially a private pay resident at [facility].  She does not qualify for medicaid special assistance. She has an Set designer on her sister that costs 200 dollars a month and "she will not even consider dropping it." She has a private room with private bath at [facility] that should cost 1400 but patient has only paid 1200 dollars. Facility will be paid direct draft starting next month if patient stays. She does not follow rules at [facility]- such as: going into the refrigeration without assistance and walking down the halls in various stages of undress.  Patient is the only female.  CM requested psych consult to determine patient competency/capacity. CM has made three attempts at time of this note to interview patient.  Unable to engage patient- she falls to sleep quickly. Asked patient several times if she would consider going back to [facility] while another facility is found and she does not respond yes or no.  Have left message for Thressa Sheller with [facility] [phone].    Expected Discharge Plan: Group Home    Expected Discharge Plan and Services Expected Discharge Plan: Group Home   Discharge Planning Services: CM  Consult Post Acute Care Choice: (Different group home) Living arrangements for the past 2 months: Group Home([facility])                                       Social Determinants of Health (SDOH) Interventions    Readmission Risk Interventions Readmission Risk Prevention Plan 02/16/2019  Transportation Screening Complete  PCP or Specialist Appt within 5-7 Days Complete  Home Care Screening Complete  Medication Review (RN CM) Complete  Some recent data might be hidden

## 2019-06-25 NOTE — ED Notes (Signed)
Spoke to social work about pt consult. Social work recommends [employer].

## 2019-06-25 NOTE — ED Notes (Signed)
Pt denies pain at this time

## 2019-06-25 NOTE — Consult Note (Signed)
[place] Psychiatry Consult   Reason for Consult: Capacity Referring Physician: Rip Harbour Patient Identification: Lauren [name] MRN:  [id number] Principal Diagnosis: <principal problem not specified> Diagnosis:  Active Problems:   * No active hospital problems. *   Total Time spent with patient: 20 minutes  Subjective:   Lauren [name] is a 68 y.o. female patient admitted with capacity.  HPI: Patient is seen and examined.  Patient is a 68 year old female with a reported past psychiatric history significant for schizoaffective disorder who presented to the [facility] emergency department on 10/15 after the patient left the group home, and the group home called 911.  The patient reported she was mistreated at the group home and did not want to stay there.  She was evaluated and social work saw the patient additionally.  Patient was wanting to find a different group home.  She stated that the group home owner had mistreated her, and did not want to stay there.  She stated that they made fun of her stutter.  She is on an ACTT service.  Apparently the group home told her that she would be discharged from there immediately.  Emergency room made arrangements for transportation for her back to the group home.  Apparently a previous [name] worker at [facility] stated that the patient's mental health issues that she has moments where she wants to stay in a group home, and other moments where she does not want to stay.  She was seen and medically cleared.  Today on examination she is alert and oriented x3.  She denied any auditory or visual hallucinations.  She denied any suicidal or homicidal ideation.  It appears that the last time she was seen by her psychiatrist was on 04/02/2019 during the chronic care management service.  She is followed by the [employer].  It appears her last psychiatric admission was on 12/29/2017 to 01/13/2018.  She was  admitted at that time because of noncompliance with medications.  She had been given Haldol decanoate injections.  Her presentation at time apparently was fairly similar.  She was not psychotic or paranoid.  At that time she was getting evicted from her home.  Past Psychiatric History:   Risk to Self:   Risk to Others:   Prior Inpatient Therapy:   Prior Outpatient Therapy:    Past Medical History:  Past Medical History:  Diagnosis Date  . Anxiety   . Apnea, sleep 02/02/2014  . Awareness of heartbeats 02/02/2014  . Breathlessness on exertion 02/02/2014  . Diabetes mellitus   . Encounter for pre-employment examination 06/29/2015  . Excessive sweating 07/05/2015  . Fibromyalgia   . GERD (gastroesophageal reflux disease)   . Gravida 1 10/26/2015   1.    . Heart murmur   . Herniated disc   . Hyperlipidemia   . Hypertension   . Itch of skin 10/26/2015  . Lack of bladder control   . Lung tumor   . Rheumatoid arthritis ([place])   . Schizoaffective disorder ([place])   . Screening for cervical cancer 07/29/2017  . 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 last seizure 2013!  [name] Sex counseling 10/26/2015  . Sleep apnea   . 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 Stiffness of both knees 06/22/2015  . Stroke [facility])     Past Surgical History:  Procedure Laterality Date  . BRAIN TUMOR EXCISION  2012   benign  .  CARDIAC CATHETERIZATION  2008  . CESAREAN SECTION    . CYST REMOVAL HAND    . LUNG LOBECTOMY  1977   benign tumor  . 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 Heart disease Brother   . Depression Mother   . Heart attack Mother   . Stroke Mother   . Alcohol abuse Father   . Stroke Father   . Diabetes Sister   . Diabetes Sister   . Stomach cancer Sister   .  Kidney disease Sister   . COPD Brother   . Lung cancer Brother   . Diabetes Brother    Family Psychiatric  History: Noncontributory Social History:  Social History   Substance and Sexual Activity  Alcohol Use No  . Alcohol/week: 0.0 standard drinks     Social History   Substance and Sexual Activity  Drug Use No    Social History   Socioeconomic History  . Marital status: Single    Spouse name: Not on file  . Number of children: 1  . Years of education: Not on file  . Highest education level: 12th grade  Occupational History  . Occupation: disbaled  Social Needs  . Financial resource strain: Somewhat hard  . Food insecurity    Worry: Sometimes true    Inability: Never true  . Transportation needs    Medical: No    Non-medical: No  Tobacco Use  . Smoking status: Never Smoker  . Smokeless tobacco: Never Used  Substance and Sexual Activity  . Alcohol use: No    Alcohol/week: 0.0 standard drinks  . Drug use: No  . Sexual activity: Not Currently  Lifestyle  . Physical activity    Days per week: 0 days    Minutes per session: 0 min  . Stress: To some extent  Relationships  . Social connections    Talks on phone: More than three times a week    Gets together: Three times a week    Attends religious service: More than 4 times per year    Active member of club or organization: No    Attends meetings of clubs or organizations: Never    Relationship status: Married  Other Topics Concern  . Not on file  Social History Narrative   She is living in a group home    Additional Social History:    Allergies:   Allergies  Allergen Reactions  . Percocet [Oxycodone-Acetaminophen] Diarrhea, Nausea And Vomiting and Nausea Only  . Tramadol Hcl Diarrhea, Nausea And Vomiting and Nausea Only  . Vicodin [Hydrocodone-Acetaminophen] Diarrhea, Nausea And Vomiting and Nausea Only    Labs:  Results for orders placed or performed during the hospital encounter of 06/24/19 (from  the past 48 hour(s))  SARS CORONAVIRUS 2 (TAT 6-24 HRS) Nasopharyngeal Nasopharyngeal Swab     Status: None   Collection Time: 06/24/19  4:30 PM   Specimen: Nasopharyngeal Swab  Result Value Ref Range   SARS Coronavirus 2 NEGATIVE NEGATIVE    Comment: (NOTE) SARS-CoV-2 target nucleic acids are NOT DETECTED. The SARS-CoV-2 RNA is generally detectable in upper and lower respiratory specimens during the acute phase of infection. Negative results do not preclude SARS-CoV-2 infection, do not rule out co-infections with other pathogens, and should not be used as the sole basis  for treatment or other patient management decisions. Negative results must be combined with clinical observations, patient history, and epidemiological information. The expected result is Negative. Fact Sheet for Patients: SugarRoll.be Fact Sheet for Healthcare Providers: https://www.woods-mathews.com/ This test is not yet approved or cleared by the [place] FDA and  has been authorized for detection and/or diagnosis of SARS-CoV-2 by FDA under an Emergency Use Authorization (EUA). This EUA will remain  in effect (meaning this test can be used) for the duration of the COVID-19 declaration under Section 56 4(b)(1) of the Act, 21 U.S.C. section 360bbb-3(b)(1), unless the authorization is terminated or revoked sooner. Performed at [facility], [place] [address] [place], [place] [address]   Basic metabolic panel     Status: Abnormal   Collection Time: 06/24/19  4:30 PM  Result Value Ref Range   Sodium 139 135 - 145 mmol/L   Potassium 2.7 (LL) 3.5 - 5.1 mmol/L    Comment: CRITICAL RESULT CALLED TO, READ BACK BY AND VERIFIED WITH BILL SMITH 06/24/19 1719 KLW    Chloride 111 98 - 111 mmol/L   CO2 22 22 - 32 mmol/L   Glucose, Bld 93 70 - 99 mg/dL   BUN 14 8 - 23 mg/dL   Creatinine, Ser 0.81 0.44 - 1.00 mg/dL   Calcium 9.5 8.9 - 10.3 mg/dL   GFR calc non Af Amer >60  >60 mL/min   GFR calc Af Amer >60 >60 mL/min   Anion gap 6 5 - 15    Comment: Performed at [facility] [address] [place], [place] [address]  CBC with Differential/Platelet     Status: Abnormal   Collection Time: 06/24/19  5:55 PM  Result Value Ref Range   WBC 7.7 4.0 - 10.5 K/uL   RBC 3.36 (L) 3.87 - 5.11 MIL/uL   Hemoglobin 10.8 (L) 12.0 - 15.0 g/dL   HCT 31.4 (L) 36.0 - 46.0 %   MCV 93.5 80.0 - 100.0 fL   MCH 32.1 26.0 - 34.0 pg   MCHC 34.4 30.0 - 36.0 g/dL   RDW 13.2 11.5 - 15.5 %   Platelets 283 150 - 400 K/uL   nRBC 0.0 0.0 - 0.2 %   Neutrophils Relative % 52 %   Neutro Abs 4.0 1.7 - 7.7 K/uL   Lymphocytes Relative 39 %   Lymphs Abs 3.0 0.7 - 4.0 K/uL   Monocytes Relative 8 %   Monocytes Absolute 0.6 0.1 - 1.0 K/uL   Eosinophils Relative 0 %   Eosinophils Absolute 0.0 0.0 - 0.5 K/uL   Basophils Relative 1 %   Basophils Absolute 0.1 0.0 - 0.1 K/uL   Immature Granulocytes 0 %   Abs Immature Granulocytes 0.02 0.00 - 0.07 K/uL    Comment: Performed at [facility], [address] [place], [place] [address]  Urinalysis, Complete w Microscopic     Status: Abnormal   Collection Time: 06/24/19  5:56 PM  Result Value Ref Range   Color, Urine YELLOW (A) YELLOW   APPearance CLEAR (A) CLEAR   Specific Gravity, Urine 1.017 1.005 - 1.030   pH 7.0 5.0 - 8.0   Glucose, UA NEGATIVE NEGATIVE mg/dL   Hgb urine dipstick NEGATIVE NEGATIVE   Bilirubin Urine NEGATIVE NEGATIVE   Ketones, ur 20 (A) NEGATIVE mg/dL   Protein, ur NEGATIVE NEGATIVE mg/dL   Nitrite NEGATIVE NEGATIVE   Leukocytes,Ua NEGATIVE NEGATIVE   RBC / HPF 0-5 0 - 5 RBC/hpf   WBC,  UA 0-5 0 - 5 WBC/hpf   Bacteria, UA RARE (A) NONE SEEN   Squamous Epithelial / LPF 0-5 0 - 5   Hyaline Casts, UA PRESENT     Comment: Performed at [facility], [address] [place], [place] [address]    Current Facility-Administered Medications  Medication Dose Route Frequency Provider Last Rate  Last Dose  . aspirin EC tablet 81 mg  81 mg Oral QAC breakfast Lilia Pro., MD      . atorvastatin (LIPITOR) tablet 40 mg  40 mg Oral q1800 Lilia Pro., MD      . benztropine (COGENTIN) tablet 1 mg  1 mg Oral TID Lilia Pro., MD   1 mg at 06/25/19 0916  . escitalopram (LEXAPRO) tablet 10 mg  10 mg Oral Daily Lilia Pro., MD   10 mg at 06/25/19 0916  . gabapentin (NEURONTIN) capsule 100 mg  100 mg Oral BID Lilia Pro., MD   100 mg at 06/25/19 0915  . hydrochlorothiazide (HYDRODIURIL) tablet 12.5 mg  12.5 mg Oral Daily Lilia Pro., MD   12.5 mg at 06/25/19 0915  . levETIRAcetam (KEPPRA) tablet 500 mg  500 mg Oral BID Lilia Pro., MD   500 mg at 06/25/19 0915  . lisinopril (ZESTRIL) tablet 20 mg  20 mg Oral Daily Lilia Pro., MD   20 mg at 06/25/19 0914  . metFORMIN (GLUCOPHAGE) tablet 500 mg  500 mg Oral BID WC Lilia Pro., MD   500 mg at 06/25/19 0914  . pantoprazole (PROTONIX) EC tablet 40 mg  40 mg Oral Daily Lilia Pro., MD   40 mg at 06/25/19 0914  . pilocarpine (SALAGEN) tablet 5 mg  5 mg Oral TID Lilia Pro., MD   5 mg at 06/25/19 6045  . potassium chloride (KLOR-CON) CR tablet 10 mEq  10 mEq Oral BID Lilia Pro., MD   10 mEq at 06/25/19 0915  . traZODone (DESYREL) tablet 50 mg  50 mg Oral QHS Lilia Pro., MD   50 mg at 06/24/19 2329  . vitamin C (ASCORBIC ACID) tablet 500 mg  500 mg Oral UD Lilia Pro., MD       Current Outpatient Medications  Medication Sig Dispense Refill  . Artificial Saliva (BIOTENE DRY MOUTH MOISTURIZING) SOLN Use as directed 1 application in the mouth or throat 2 (two) times daily. 44.3 mL 5  . Ascorbic Acid 500 MG CHEW Chew 1 tablet (500 mg total) by mouth as directed. 60 tablet 5  . aspirin EC 81 MG tablet Take 1 tablet (81 mg total) by mouth daily before breakfast. 30 tablet 5  . atorvastatin (LIPITOR) 40 MG tablet Take 1 tablet (40 mg total) by mouth daily at 6 PM. 30 tablet 2  . benztropine (COGENTIN) 1 MG  tablet Take 1 tablet by mouth 3 (three) times daily.    . blood glucose meter kit and supplies KIT Dispense based on patient and insurance preference. Use up to four times daily as directed. (FOR ICD-9 250.00, 250.01). 1 each 0  . Dentifrices (BIOTENE DRY MOUTH GENTLE) PSTE Place 1 application onto teeth 2 (two) times daily. 121.9 g 5  . Elastic Bandages & Supports (KNEE COMPRESSION SLEEVE/L/XL) MISC 2 each by Does not apply route as needed. Wear during day; take off at night 2 each 0  . escitalopram (LEXAPRO) 10 MG tablet Take 1 tablet (10 mg total) by mouth daily. 90 tablet  1  . gabapentin (NEURONTIN) 100 MG capsule Take 1 capsule (100 mg total) by mouth 2 (two) times daily. 180 capsule 1  . haloperidol (HALDOL) 5 MG tablet     . haloperidol decanoate (HALDOL DECANOATE) 100 MG/ML injection     . hydrochlorothiazide (HYDRODIURIL) 12.5 MG tablet Take 1 tablet (12.5 mg total) by mouth daily. New dose, stop hctz 25 mg 90 tablet 0  . ibuprofen (ADVIL,MOTRIN) 200 MG tablet Take 600 mg by mouth every 8 (eight) hours as needed (for knee pain.).    [name] levETIRAcetam (KEPPRA) 500 MG tablet Take 1 tablet by mouth 2 (two) times a day.    . lisinopril (ZESTRIL) 20 MG tablet Take 1 tablet (20 mg total) by mouth daily. 30 tablet 5  . Magnesium 400 MG CAPS Take 1 capsule by mouth 2 (two) times daily. 60 capsule 2  . metFORMIN (GLUCOPHAGE) 500 MG tablet Take 1 tablet (500 mg total) by mouth 2 (two) times daily with a meal. 30 tablet 2  . omeprazole (PRILOSEC) 20 MG capsule Take 1 capsule (20 mg total) by mouth 2 (two) times daily. 180 capsule 0  . pilocarpine (SALAGEN) 5 MG tablet Take 1 tablet by mouth 3 (three) times daily.    . potassium chloride (KLOR-CON) 10 MEQ tablet Take 1 tablet (10 mEq total) by mouth 2 (two) times daily. 60 tablet 0  . traZODone (DESYREL) 50 MG tablet Take 1 tablet (50 mg total) by mouth at bedtime. 30 tablet 3    Musculoskeletal: Strength & Muscle Tone: within normal limits Gait &  Station: N/A Patient leans: N/A  Psychiatric Specialty Exam: Physical Exam  Nursing note and vitals reviewed. Constitutional: She is oriented to person, place, and time. She appears well-developed and well-nourished.  HENT:  Head: Normocephalic and atraumatic.  Respiratory: Effort normal.  Neurological: She is alert and oriented to person, place, and time.    ROS  Blood pressure (!) 150/97, pulse 72, temperature 98.2 F (36.8 C), temperature source Oral, resp. rate 17, height 5' 4"  (1.626 m), weight 112 kg, SpO2 99 %.Body mass index is 42.4 kg/m.  General Appearance: Disheveled  Eye Contact:  Fair  Speech:  Normal Rate  Volume:  Normal  Mood:  Anxious  Affect:  Congruent  Thought Process:  Coherent and Descriptions of Associations: Circumstantial  Orientation:  Full (Time, Place, and Person)  Thought Content:  Logical  Suicidal Thoughts:  No  Homicidal Thoughts:  No  Memory:  Immediate;   Fair Recent;   Fair Remote;   Fair  Judgement:  Impaired  Insight:  Lacking  Psychomotor Activity:  Normal  Concentration:  Concentration: Fair and Attention Span: Fair  Recall:  [employer] of Knowledge:  Fair  Language:  Good  Akathisia:  Negative  Handed:  Right  AIMS (if indicated):     Assets:  Desire for Improvement Resilience  ADL's:  Intact  Cognition:  WNL  Sleep:        Treatment Plan Summary: Daily contact with patient to assess and evaluate symptoms and progress in treatment, Medication management and Plan : Patient is seen and examined.  Patient is a 68 year old female with the above-stated past psychiatric history of which consultation for "capacity" was called in the emergency department.  Disposition: No evidence of imminent risk to self or others at present.   Patient does not meet criteria for psychiatric inpatient admission.   Patient is seen in consultation.  The patient is alert and oriented x3.  She stated the reason why she does not want to go back to the  group home is "I do not like it".  She denied any auditory or visual hallucinations.  She denied any suicidal or homicidal ideation.  She does not fulfill criteria for admission.  I think it would be best to have social work contact her guardian, arrange for transport to a different group home facility.  It looks like from the notes that those have been attempted, but it does not appear that she requires psychiatric admission at this time.  No change in her current psychiatric medications.  Diagnosis: #1 schizoaffective disorder; bipolar type Sharma Covert, MD 06/25/2019 1:31 PM

## 2019-06-25 NOTE — ED Notes (Signed)
This RN spoke with Lauren [name] [phone] to provide pt status update. Sister st pt ran away from group home yesterday and police called.

## 2019-06-25 NOTE — ED Notes (Signed)
SW Joni Reining at bedside.

## 2019-06-25 NOTE — ED Notes (Signed)
Pt lying in bed, eyes closed, equal chest rise and fall with unlabored RR. Toileting was offered to pt, nothing more needed from staff at this time

## 2019-06-25 NOTE — ED Provider Notes (Signed)
-----------------------------------------   9:17 AM on 06/25/2019 -----------------------------------------   Blood pressure (!) 146/98, pulse 70, temperature 98.3 F (36.8 C), temperature source Oral, resp. rate 15, height 5\' 4"  (1.626 m), weight 112 kg, SpO2 95 %.  The patient is calm and cooperative at this time.  There have been no acute events since the last update.  Awaiting disposition plan from  Social Work team.    Nena Polio, MD 06/25/19 [id number]

## 2019-06-25 NOTE — ED Notes (Signed)
Pt provided with water. Per pt's request.

## 2019-06-25 NOTE — ED Notes (Signed)
Pt ate 75% of her meal

## 2019-06-25 NOTE — ED Notes (Signed)
This RN spoke with Joni Reining regarding pt's placement status. SW st she has asses the pt.

## 2019-06-25 NOTE — ED Notes (Signed)
This RN spoke with Hoy Finlay (CW), current group home will not take pt at this time. CW will attempt placement at an [facility].

## 2019-06-25 NOTE — ED Notes (Signed)
This RN provided an update to family Humberto Leep.

## 2019-06-25 NOTE — TOC Progression Note (Signed)
Transition of Care [facility]) - Progression Note    Patient Details  Name: Lauren [name] MRN: [id number] Date of Birth: 28-Feb-1951  [facility] CM/SW Contact  Katrina Stack, RN Phone Number: 06/25/2019, 7:33 PM  Clinical Narrative:   Have had many conversations with Thressa Sheller with [facility]. AM and [name] spoke with [facility] multiple times and Darlene firm in decision not to allow the patient to return.  Facility fears she will "walk out again" and they feel responsible for her.  Psych has documented patient has capacity.   Discussed with [name] whether patient may do well in  family care home type of environment, rather than a mental health group home.  [name] will investigate. Patient did become more laert late this afternoon. CM discussed that she would not be allowed to return to [place] (even though she has maintained she wanted to leave). CM discussed that her family has been contacted and will not allow her to come to there homes. CM asked- "where will you go." She says "I do not know." Based on her history that has been reported during this case about patient not being able to take care of herself regarding: bathing and grooming, obtaining and preparing food, obtaining / taking medications, lack of transportation, patient should be disposed to an assisted living type facility.  Called: [facility], [facility], [facility], [facility], [facility], [facility]. All were unable to accept.  [facility] and [facility]:  will have to call back on Monday.  She is not allowed to go to the shelter per [facility] because she has income.    Expected Discharge Plan: Group Home    Expected Discharge Plan and Services Expected Discharge Plan: Group Home   Discharge Planning Services: CM Consult Post Acute Care Choice: (Different group home) Living arrangements for the past 2 months: Group Home([facility])                                        Social Determinants of Health (SDOH) Interventions    Readmission Risk Interventions Readmission Risk Prevention Plan 02/16/2019  Transportation Screening Complete  PCP or Specialist Appt within 5-7 Days Complete  Home Care Screening Complete  Medication Review (RN CM) Complete  Some recent data might be hidden

## 2019-06-25 NOTE — NC FL2 (Addendum)
[place] LEVEL OF CARE SCREENING TOOL     IDENTIFICATION  Patient Name: Lauren [name] Birthdate: 1950/12/30 Sex: female Admission Date (Current Location): 06/24/2019  [facility] and [place] Number:  Engineering geologist and Address:         Provider Number: [id number]  Attending Physician Name and Address:  No att. providers found  Relative Name and Phone Number:       Current Level of Care: Hospital(Being held in the Emergency Room) Recommended Level of Care: [place], [place] Prior Approval Number:    Date Approved/Denied:   PASRR Number:    Discharge Plan: (Assisted Living / [facility])    Current Diagnoses: Patient Active Problem List   Diagnosis Date Noted  . 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 SS-A antibody positive 04/15/2019  . AKI (acute kidney injury) ([place]) 04/06/2019  . Slurred speech 03/04/2019  . Chest pain in adult 02/15/2019  . Acute respiratory failure ([place]) 02/15/2019  . Seizure-like activity ([place]) 12/29/2018  . Seizures ([place]) 11/24/2018  . Paresthesias 11/22/2018  . Mouth dryness 10/12/2018  . Hx of resection of meningioma 07/21/2018  . Tremor 07/21/2018  . Schizoaffective disorder ([place]) 12/29/2017  . Morbid obesity ([place]) 12/09/2017  . Meningioma ([place]) 11/25/2017  . Atherosclerosis of aorta ([place]) 11/25/2017  . Calcification of coronary artery 11/25/2017  . Schizophrenia ([place]) 11/04/2017  . Acid reflux 06/29/2015  . Diabetes mellitus type 2, controlled, without complications ([place]) 51/76/1607  . Cardiac murmur 06/29/2015  . Arthritis of knee, degenerative 06/28/2015  . Insomnia w/ sleep apnea 03/21/2015  . Anxiety disorder 03/21/2015  . Hypertension 03/21/2015  . HLD (hyperlipidemia) 03/21/2015  . Urge incontinence 03/21/2015    Orientation RESPIRATION BLADDER Height & Weight     Self, Time, Situation, Place  Normal Continent Weight: 112 kg Height:  5' 4"  (162.6 cm)  BEHAVIORAL  SYMPTOMS/MOOD NEUROLOGICAL BOWEL NUTRITION STATUS    Seizure disorder Continent Diet  AMBULATORY STATUS COMMUNICATION OF NEEDS Skin   Independent Verbally Normal                       Personal Care Assistance Level of Assistance              Functional Limitations Info  Speech     Speech Info: Impaired    SPECIAL CARE FACTORS FREQUENCY                       Contractures Contractures Info: Not present    Additional Factors Info  Code Status Code Status Info: full             Current Medications (06/25/2019):  This is the current hospital active medication list Current Facility-Administered Medications  Medication Dose Route Frequency Provider Last Rate Last Dose  . aspirin EC tablet 81 mg  81 mg Oral QAC breakfast Lilia Pro., MD   81 mg at 06/25/19 1553  . atorvastatin (LIPITOR) tablet 40 mg  40 mg Oral q1800 Lilia Pro., MD      . benztropine (COGENTIN) tablet 1 mg  1 mg Oral TID Lilia Pro., MD   1 mg at 06/25/19 1554  . escitalopram (LEXAPRO) tablet 10 mg  10 mg Oral Daily Lilia Pro., MD   10 mg at 06/25/19 0916  . gabapentin (NEURONTIN) capsule 100 mg  100 mg Oral BID Lilia Pro., MD   100 mg at 06/25/19 0915  .  hydrochlorothiazide (HYDRODIURIL) tablet 12.5 mg  12.5 mg Oral Daily Lilia Pro., MD   12.5 mg at 06/25/19 0915  . levETIRAcetam (KEPPRA) tablet 500 mg  500 mg Oral BID Lilia Pro., MD   500 mg at 06/25/19 0915  . lisinopril (ZESTRIL) tablet 20 mg  20 mg Oral Daily Lilia Pro., MD   20 mg at 06/25/19 0914  . metFORMIN (GLUCOPHAGE) tablet 500 mg  500 mg Oral BID WC Lilia Pro., MD   500 mg at 06/25/19 1615  . pantoprazole (PROTONIX) EC tablet 40 mg  40 mg Oral Daily Lilia Pro., MD   40 mg at 06/25/19 0914  . pilocarpine (SALAGEN) tablet 5 mg  5 mg Oral TID Lilia Pro., MD   5 mg at 06/25/19 1553  . potassium chloride (KLOR-CON) CR tablet 10 mEq  10 mEq Oral BID Lilia Pro., MD   10 mEq at 06/25/19  0915  . traZODone (DESYREL) tablet 50 mg  50 mg Oral QHS Lilia Pro., MD   50 mg at 06/24/19 2329  . vitamin C (ASCORBIC ACID) tablet 500 mg  500 mg Oral UD Lilia Pro., MD       Current Outpatient Medications  Medication Sig Dispense Refill  . Artificial Saliva (BIOTENE DRY MOUTH MOISTURIZING) SOLN Use as directed 1 application in the mouth or throat 2 (two) times daily. 44.3 mL 5  . Ascorbic Acid 500 MG CHEW Chew 1 tablet (500 mg total) by mouth as directed. 60 tablet 5  . aspirin EC 81 MG tablet Take 1 tablet (81 mg total) by mouth daily before breakfast. 30 tablet 5  . benztropine (COGENTIN) 1 MG tablet Take 1-2 mg by mouth 2 (two) times daily. One tablet (37m) in the morning and two tablets (25m at bedtime    . Dentifrices (BIOTENE DRY MOUTH GENTLE) PSTE Place 1 application onto teeth 2 (two) times daily. 121.9 g 5  . escitalopram (LEXAPRO) 10 MG tablet Take 1 tablet (10 mg total) by mouth daily. 90 tablet 1  . gabapentin (NEURONTIN) 100 MG capsule Take 1 capsule (100 mg total) by mouth 2 (two) times daily. 180 capsule 1  . haloperidol (HALDOL) 5 MG tablet Take 5 mg by mouth daily.     . haloperidol decanoate (HALDOL DECANOATE) 100 MG/ML injection     . hydrochlorothiazide (HYDRODIURIL) 12.5 MG tablet Take 1 tablet (12.5 mg total) by mouth daily. New dose, stop hctz 25 mg 90 tablet 0  . levETIRAcetam (KEPPRA) 500 MG tablet Take 1 tablet by mouth 2 (two) times a day.    . lisinopril (ZESTRIL) 20 MG tablet Take 1 tablet (20 mg total) by mouth daily. 30 tablet 5  . Magnesium 400 MG CAPS Take 1 capsule by mouth 2 (two) times daily. 60 capsule 2  . metFORMIN (GLUCOPHAGE) 500 MG tablet Take 1 tablet (500 mg total) by mouth 2 (two) times daily with a meal. 30 tablet 2  . omeprazole (PRILOSEC) 20 MG capsule Take 1 capsule (20 mg total) by mouth 2 (two) times daily. 180 capsule 0  . potassium chloride (KLOR-CON) 10 MEQ tablet Take 1 tablet (10 mEq total) by mouth 2 (two) times daily. 60  tablet 0  . atorvastatin (LIPITOR) 40 MG tablet Take 1 tablet (40 mg total) by mouth daily at 6 PM. 30 tablet 2  . blood glucose meter kit and supplies KIT Dispense based on patient and insurance preference. Use up to four times  daily as directed. (FOR ICD-9 250.00, 250.01). 1 each 0  . Elastic Bandages & Supports (KNEE COMPRESSION SLEEVE/L/XL) MISC 2 each by Does not apply route as needed. Wear during day; take off at night 2 each 0  . ibuprofen (ADVIL,MOTRIN) 200 MG tablet Take 600 mg by mouth every 8 (eight) hours as needed (for knee pain.).    [name] pilocarpine (SALAGEN) 5 MG tablet Take 1 tablet by mouth 3 (three) times daily.    . traZODone (DESYREL) 50 MG tablet Take 1 tablet (50 mg total) by mouth at bedtime. 30 tablet 3     Discharge Medications: Please see discharge summary for a list of discharge medications.  Relevant Imaging Results:  Relevant Lab Results:   Additional Information Talyssa Gibas Carlota Raspberry RN  Katrina Stack, RN

## 2019-06-25 NOTE — ED Notes (Signed)
Pt given breakfast tray and repositioned in bed.

## 2019-06-25 NOTE — ED Notes (Signed)
This Rn spoke with Humberto Leep to provide pt status update. Sister requesting to speak to pt. Pt provided with phone to speak to sister.

## 2019-06-26 LAB — GLUCOSE, CAPILLARY: Glucose-Capillary: 133 mg/dL — ABNORMAL HIGH (ref 70–99)

## 2019-06-26 NOTE — ED Notes (Signed)
This RN offered to help get patient out of her clothes she arrived in and put on clean gown and patient refused.

## 2019-06-26 NOTE — ED Notes (Signed)
TV turned on for patient. Patient comfortable in bed.

## 2019-06-26 NOTE — ED Notes (Signed)
Pt took bedtime meds without difficulty, calm and cooperative at this time

## 2019-06-26 NOTE — ED Notes (Signed)
Pt ambulated to bathroom with a the assistance of a cane and with this RN at bedside. Pt voided 358ml; clear urine. Pt walked back to room with cane. Pt placed back on stretcher safety  By this RN. Pt does not voice any other needs a this time. This RN will continue to monitor pt. No new orders.

## 2019-06-26 NOTE — ED Notes (Signed)
Patient resting quietly with eyes closed in no acute distress at this time.

## 2019-06-26 NOTE — ED Notes (Signed)
Patient assisted to bathroom by this RN and Cleone Slim.

## 2019-06-26 NOTE — ED Provider Notes (Signed)
-----------------------------------------   6:28 AM on 06/26/2019 -----------------------------------------   Blood pressure 130/81, pulse 71, temperature 98.2 F (36.8 C), temperature source Oral, resp. rate 18, height 5\' 4"  (1.626 m), weight 112 kg, SpO2 97 %.  The patient is sleeping at this time.  There have been no acute events since the last update.  Awaiting disposition plan from Social Work team(s).   Paulette Blanch, MD 06/26/19 [phone]

## 2019-06-26 NOTE — ED Notes (Signed)
Patient given breakfast tray.

## 2019-06-27 NOTE — ED Notes (Addendum)
Pt finished her breakfast, no distress noted, remote given to patient upon offering it to her, cont to monitor

## 2019-06-27 NOTE — ED Notes (Signed)
Pt resting in bed with eyes closed and TV on, NAD, will continue to monitor

## 2019-06-27 NOTE — ED Notes (Addendum)
Pt provided with warm water, soap, washcloths, towels and a razor at bedside to preform personal hygiene. This RN at bed side to assist as needed. Provided with clean gown

## 2019-06-27 NOTE — ED Notes (Signed)
Pt is sleeping, visible chest rise observed, stretcher locked in lowest position, call bell within reach.

## 2019-06-27 NOTE — ED Notes (Addendum)
Pt assisted to bathroom with the use of her cane, pt requesting to use the bathroom and wash up, pt given materials to do so, pt stated she didn't need help, pt oriented to call button if help was needed

## 2019-06-27 NOTE — ED Notes (Addendum)
Pt resting quietly with eyes closed, no distress noted, cont to monitor 

## 2019-06-27 NOTE — ED Provider Notes (Signed)
-----------------------------------------   6:12 AM on 06/27/2019 -----------------------------------------   Blood pressure (!) 147/81, pulse 70, temperature 97.7 F (36.5 C), temperature source Oral, resp. rate 17, height 5\' 4"  (1.626 m), weight 112 kg, SpO2 98 %.  The patient is sleeping at this time.  There have been no acute events since the last update.  Awaiting disposition plan from Behavioral Medicine and/or Social Work team(s).   Paulette Blanch, MD 06/27/19 [phone]

## 2019-06-27 NOTE — ED Notes (Signed)
Report from [place], Therapist, sports. Patient is currently asleep.

## 2019-06-27 NOTE — ED Notes (Signed)
Report received from Daniel, RN

## 2019-06-27 NOTE — ED Notes (Signed)
Pt cont to rest with eyes closed, no distress noted, cont to monitor

## 2019-06-27 NOTE — ED Notes (Signed)
Pt finished entire meal tray, eating her [place] sub sandwich, grapes, and carrots with grape juice, pt states that it was good and is asking for a comb for her hair. Pt is smiling and appears to be happy at this time, no distress noted, cont to monitor.

## 2019-06-27 NOTE — ED Notes (Signed)
Assisted to bathroom

## 2019-06-27 NOTE — ED Notes (Signed)
Report received from Ashley RN.

## 2019-06-27 NOTE — ED Notes (Signed)
Report given to Ashley, RN

## 2019-06-28 ENCOUNTER — Emergency Department: Payer: Medicare Other

## 2019-06-28 ENCOUNTER — Other Ambulatory Visit: Payer: Self-pay

## 2019-06-28 DIAGNOSIS — R0682 Tachypnea, not elsewhere classified: Secondary | ICD-10-CM | POA: Diagnosis not present

## 2019-06-28 LAB — GLUCOSE, CAPILLARY: Glucose-Capillary: 113 mg/dL — ABNORMAL HIGH (ref 70–99)

## 2019-06-28 MED ORDER — HYDRALAZINE HCL 25 MG PO TABS
25.0000 mg | ORAL_TABLET | Freq: Once | ORAL | Status: AC
  Administered 2019-06-28: 25 mg via ORAL
  Filled 2019-06-28: qty 1

## 2019-06-28 NOTE — ED Notes (Addendum)
Sister called requesting to speak with patient, [phone]. Given phone and number and assisted patient to call back

## 2019-06-28 NOTE — ED Notes (Signed)
Pt oob to BR with cane and stand-by assist.

## 2019-06-28 NOTE — ED Notes (Signed)
Patient awakens when I walked into her room. Given an additional warm blanket and repositioned in bed for comfort. Patient denies need for anything at this time. Will continue to monitor.

## 2019-06-28 NOTE — ED Notes (Signed)
Patient remains soundly asleep. Will assist to restroom when patient awakens.

## 2019-06-28 NOTE — ED Notes (Signed)
Pt resting in bed at this time, eyes closed, pt RR even and unlabored with equal rise and fall of chest. Call bell at bedside, bed low to floor. Will continue to monitor.

## 2019-06-28 NOTE — ED Notes (Signed)
Pt ambulated to the showers by this RN and Threasa Beards, EDT. Pt currently in the showers with Threasa Beards, EDT.

## 2019-06-28 NOTE — ED Notes (Signed)
Results of patient's chest X-ray reviewed with Dr. Corky Downs at this time.

## 2019-06-28 NOTE — ED Notes (Signed)
Pt up to toilet without difficulty.

## 2019-06-28 NOTE — ED Notes (Signed)
Pt ate 100% of her meal tray at this time.

## 2019-06-28 NOTE — ED Notes (Signed)
Walked pt to take shower, changed clothes, brushed teeth, and toileted pt. Pt is resting in bed with clean linens.

## 2019-06-28 NOTE — ED Notes (Signed)
Patient continues to sleep soundly. Lights down in the area for patient comfort.

## 2019-06-28 NOTE — ED Notes (Signed)
Pt returned from shower at this time. Pt changed into personal clothing at this time, personal gown and underwear per patient request. Pt placed into grip socks for patient safety. Hospital bed placed in patient room for patient comfort, breakfast tray placed in room. Pt assisted to the bathroom by Threasa Beards, EDT.

## 2019-06-28 NOTE — ED Notes (Signed)
Patient continues to sleep soundly. Will continue to monitor.

## 2019-06-28 NOTE — ED Notes (Signed)
NAD noted at this time. Pt resting in bed with lights dimmed. Will continue to monitor for further patient needs.

## 2019-06-28 NOTE — ED Notes (Signed)
Pt repositioned in bed at this time. Pt provided with hand sanitizer to clean her hands prior to eating. Pt provided with meal tray and fresh ice water per her request at this time. Will continue to monitor for further patient needs.

## 2019-06-28 NOTE — ED Notes (Signed)
Medication administered per MD order. NAD noted at this time. Pt given more water per her request. Pt denies any needs needs at this time, lights dimmed for patient comfort.

## 2019-06-28 NOTE — ED Notes (Signed)
This RN to bedside at this time. Introduced self to patient. Pt resting in bed with lights dimmed. Explained to patient would be getting her in to take a shower shortly. Pt states understanding. Will continue to monitor for further patient needs.

## 2019-06-28 NOTE — Social Work (Addendum)
[name] with [facility] [phone]) shared that she had concerns about patient only wanting to go to the facility where her son stays, which would not be allowed. CSW will ask patient if she is open to going to any other family care home.  [name] shared that [facility] is not looking for placement for patient, but is willing to assist with finding one. [name] is wanting to attempt to find a facility that is not co-ed.    2:00pm Patient has no specific preferences for [facility], as long as it does not have any bed bugs.   CSW faxed FL2 to:   [facility] [place] [facility]   [facility]  [facility]  [facility] [place]  [facility] [place] [facility] [place] [place] [facility] [place] [facility]  [facility] [place], [place] ED  [phone]

## 2019-06-28 NOTE — ED Notes (Signed)
Pt resting in bed with NAD noted at this time. Pt denies any needs. Lights remain dimmed per patient request. Will continue to monitor for further patient needs.

## 2019-06-28 NOTE — ED Notes (Signed)
Patient rang the call bell to use the bathroom. Patient assisted to sitting position, with her cane she ambulated unassisted by this RN to the bathroom without incident. Patient remains with nonskid socks. Patient returned back to bed, gowns changed and bed linen straightened for patient comfort. Patient is pleasant and cooperative at this time. Denies need for anything at this time.

## 2019-06-28 NOTE — ED Notes (Signed)
CSW at bedside at this time.  

## 2019-06-28 NOTE — ED Notes (Signed)
Pt noted to have dyspnea with exertion after ambulating to the bathroom, when asked if she feels alright, pt states, "I feel alright", then when asked if she feels SOB pt states, "yeah I feel a little". Pt's O2 sats after ambulating 98% on RA. Pt back to bed without incident, ate approx 60% of her lunch tray at this time.

## 2019-06-28 NOTE — ED Notes (Signed)
Pt's blood sugar checked and metformin given at this time along with her dinner tray.

## 2019-06-29 LAB — GLUCOSE, CAPILLARY: Glucose-Capillary: 114 mg/dL — ABNORMAL HIGH (ref 70–99)

## 2019-06-29 NOTE — ED Notes (Signed)
PT ambulatory to BR without difficulty. 

## 2019-06-29 NOTE — Social Work (Signed)
[place] from [facility] came to assess patient. Lauren [name] had concerns that since patient does not have Medicaid, she would not be able to afford the care that she would need at the group home. CSW was provided with a contact from patient's ACT team with [facility] to get a better idea of how much income patient has. Lauren [name] stated that she will speak with [name] from [facility].   CSW will follow-up on this matter.   [place], [place] ED  [phone]

## 2019-06-29 NOTE — ED Notes (Signed)
Patient resting at this time. Respirations have eased. Patient denies needs at this time. Call light within reach. Will continue to monitor.

## 2019-06-29 NOTE — ED Notes (Signed)
Social work and representative from group home arrived to speak with patient about group home placement.

## 2019-06-29 NOTE — ED Notes (Signed)
Pt states that she feels like she is short of breath, this nurse instructed pt to take long deep breaths, and her shortness of breath seemed to subside.

## 2019-06-29 NOTE — ED Notes (Signed)
Patient resting in bed. Even and non labored respirations noted. Will continue to monitor.

## 2019-06-29 NOTE — Social Work (Addendum)
CSW has contacted the following family care homes today so far:   [place] - No beds available  [facility] - No beds available  [facility]  [place] - No Beds available  [facility] - phone number and name left for owner to reach out once she is available.   [facility] - No answer, no option to leave voicemail [place] - Will review with director of the facility, then call me back . (Update: Too expensive for patient)  [facility] - Owner will come to interview her around 4/4:30pm today.    [place], [place] ED  [phone]

## 2019-06-29 NOTE — ED Notes (Signed)
Pt given food tray.

## 2019-06-29 NOTE — ED Notes (Signed)
Patient resting on right side. Even and non labored respirations noted. Call light within reach. Denies needs. Will continue to monitor.

## 2019-06-30 NOTE — ED Notes (Signed)
Patient resting, no complaints at this time. Comfortable in hospital bed

## 2019-06-30 NOTE — ED Notes (Signed)
Assisted patient to restroom. Repositioned patient back in bed

## 2019-06-30 NOTE — ED Provider Notes (Signed)
-----------------------------------------   10:22 AM on 06/30/2019 -----------------------------------------   Blood pressure (!) 169/93, pulse 65, temperature 98 F (36.7 C), temperature source Oral, resp. rate 18, height 5\' 4"  (1.626 m), weight 112 kg, SpO2 98 %.  The patient is calm and cooperative at this time.  There have been no acute events since the last update.  Awaiting disposition plan from Behavioral Medicine and/or Social Work team(s).    Merlyn Lot, MD 06/30/19 1022

## 2019-06-30 NOTE — ED Notes (Signed)
Pt ambulated to bathroom with assistance. Pt performed self hygiene and returned to bed safely.

## 2019-06-30 NOTE — ED Notes (Signed)
Pt given breakfast tray

## 2019-06-30 NOTE — ED Notes (Signed)
Pt up to bathroom with assistance.  Resumed care from [place] rn  Pt alert.

## 2019-06-30 NOTE — ED Notes (Signed)
meds given

## 2019-06-30 NOTE — ED Notes (Signed)
Pt assisted to toilet at this time. Pt ambulatory with cane, steady gait.

## 2019-06-30 NOTE — ED Notes (Signed)
Checked in on patient, she is sleeping. Observed steady respirations and no signs of distress.

## 2019-06-30 NOTE — ED Notes (Signed)
Pt sleeping. 

## 2019-06-30 NOTE — Social Work (Signed)
Lauren [name] from patient's ACT team called CSW. She asked how long patient will be in the ED. CSW explained that it will be difficult for her to be placed due to patient not having special assistance Medicaid. Lauren [name] reported that she will call Jamas Lav with [facility], as she received a text message from her, but was unsure of who it was.  Lauren [name] shared that she has not had any luck with getting patient placed either. Lauren [name] will call CSW back.    [address] [place] [phone]

## 2019-06-30 NOTE — ED Notes (Signed)
Pt watching tv  Pt alert.

## 2019-06-30 NOTE — ED Notes (Signed)
Pt up to bathroom with assistance.  Pt watching tv.  No acute distress.

## 2019-07-01 NOTE — Social Work (Addendum)
CSW attempted to contact [name] with [facility] ACT team to see if she was able to return phone call to [place] from the family care home, to answer questions regarding patient. Message was left.   9:49pm - [name] contacted CSW to let her know that the plan at this time is to contact the previous home to discover how much money patient will be able to pay out of pocket to stay at a family care home. [name] will follow-up with CSW.   10:41am - [name] shared with CSW that [facility] would not be able to accept patient due to her not having special assistance Medicaid and not being able to afford staying in her home. [name] shared that the patient's previous home still has patient's money for the month so she does not believe patient would be able to afford to go to a motel. [name] shared that she will also keep looking into placement for patient.   11:56am - CSW left a voicemail for [facility] financial counselor, Lizbeth Bark [phone]) to see if she would be able to assist with patient getting special assistance medicaid.   [place], [place] ED  [phone]

## 2019-07-01 NOTE — ED Notes (Signed)
Pt ate 100% of lunch tray, denies further needs. Pt clean and dry.

## 2019-07-01 NOTE — ED Notes (Addendum)
PT up to restroom, no BM but passing gas. Linens straightened. PT ate all of hamburger and grapes for dinner as well as a glass of water and 50% of cranberry juice. Trash thrown away.

## 2019-07-01 NOTE — ED Notes (Addendum)
Pt awake, alert, denies further needs. Pt oriented X 4. States that she ate 100% of her breakfast. Bed clean and dry, pt clean and dry

## 2019-07-01 NOTE — ED Notes (Signed)
Pt sleeping. 

## 2019-07-01 NOTE — ED Notes (Signed)
Assisted pt in setting up meal tray

## 2019-07-01 NOTE — ED Notes (Signed)
Pt given meal tray at this time, sat up on side of bed to eat. Will continue to monitor for further patient needs.

## 2019-07-01 NOTE — ED Notes (Signed)
Pt assisted to the bathroom at this time by Joellen [place], Therapist, sports. Pt back to bed without incident. NAD noted at this time. Will continue to monitor for further patient needs.

## 2019-07-01 NOTE — ED Notes (Signed)
Pt up to restroom to urinate. Bed clean and dry, pt clean and dry. This RN straightened up patient sheets and room.

## 2019-07-01 NOTE — ED Notes (Signed)
Report off to vanessa rn 

## 2019-07-01 NOTE — ED Notes (Signed)
PT given wipes to clean up and fresh blankets provided. PT readjusted in bed.

## 2019-07-01 NOTE — ED Notes (Signed)
Pt given lunch, sitting up in bed eating currenlty.

## 2019-07-02 LAB — GLUCOSE, CAPILLARY: Glucose-Capillary: 145 mg/dL — ABNORMAL HIGH (ref 70–99)

## 2019-07-02 MED ORDER — ONDANSETRON 4 MG PO TBDP
4.0000 mg | ORAL_TABLET | Freq: Once | ORAL | Status: AC
  Administered 2019-07-02: 4 mg via ORAL

## 2019-07-02 MED ORDER — ONDANSETRON 4 MG PO TBDP
ORAL_TABLET | ORAL | Status: AC
  Filled 2019-07-02: qty 1

## 2019-07-02 NOTE — ED Notes (Signed)
Provided pt oral care, pt able to brush teeth on her own on left side RN assisted with right side, Denies any other needs at present. Will continue to monitor

## 2019-07-02 NOTE — ED Notes (Signed)
Pt assisted to bathroom. Have contacted social worker to check on pt status, she will return call.  Pt aware.

## 2019-07-02 NOTE — ED Notes (Signed)
Pt given ice water at this time. No further needs expressed at this time.

## 2019-07-02 NOTE — ED Notes (Signed)
Pt up to use bathroom and breakfast given

## 2019-07-02 NOTE — ED Notes (Signed)
Report given to Val, RN

## 2019-07-02 NOTE — ED Notes (Signed)
Pt ambulatory to restroom with minimal assistance using cane, provided pt with undergarment, this RN changes pt's linen, repositioned pt in bed. Provided pt with remote control for TV. No other needs at this time. Will continue to monitor.

## 2019-07-02 NOTE — ED Notes (Signed)
Ambulated to restroom with can. NAD.

## 2019-07-02 NOTE — ED Notes (Signed)
This RN called into room due to pt vomiting. Pt with standby assist to BR at this time.

## 2019-07-02 NOTE — ED Notes (Signed)
RN received note from Network engineer that pt received a phone call from [facility] [phone]. Rn provided pt with phone number, pt reports this person is her sister and wanted to talk to her, RN provided phone to pt and assisted pt by dialing phone number for pt.

## 2019-07-02 NOTE — ED Notes (Signed)
Pt ambulatory to restroom, no assistance. Denies any needs at present

## 2019-07-02 NOTE — ED Notes (Signed)
Lights dimmed pt resting.

## 2019-07-02 NOTE — ED Notes (Signed)
Given lunch tray.

## 2019-07-02 NOTE — ED Notes (Signed)
Pt resting comfortably at this time without evidence of pain or discomfort.

## 2019-07-02 NOTE — ED Notes (Signed)
Received report from [place], [place] care assumed at this time

## 2019-07-02 NOTE — ED Notes (Signed)
Pt alert and disoriented to time only.  NAD. VSS. Waiting on disposition.  No needs at this time. TV turned on for pt when in room.

## 2019-07-02 NOTE — ED Notes (Signed)
Report received from end-shift RN. Patient care assumed. Patient/RN introduction complete. Will continue to monitor. Pt resting comfortably with eyes closed, awaiting placement. No signs or pain or distress at this time.

## 2019-07-02 NOTE — ED Notes (Signed)
Sent pharmacy message about missing doses of gabapentin and potassium

## 2019-07-02 NOTE — Progress Notes (Signed)
CSW received call from patient's RN Mateo Flow stating that she was given report from the previous RN that patient's sister called to the hospital and stated that she would come get patient from her group and and take her home until something could be arranged for patient to go to another group home. CSW explained there is no documentation of this and RN was unsure the name of the person who called and stated this. Patient has been in the ED 7 days and there has not been any note of communication with the family from the primary [facility] ED CSW. CSW explained to RN that we will reach out to patient's ACT Team to see if they are aware of this. It is also unclear if financial counseling came to see patient. CSW attempted to follow up with financial counseling and patient's ACT Team without success. Due to the lack of information on what sister stated this information (patient has 2 sister listed on facesheet), CSW did not proceed with investigating and will handoff to the primary social work to follow up more on this information.   [place], LCSW Transitions of Care Department [facility] ED [phone]

## 2019-07-02 NOTE — ED Notes (Signed)
Pt resting comfortably with eyes closed, no co pain or discomfort at this time.

## 2019-07-03 LAB — GLUCOSE, CAPILLARY: Glucose-Capillary: 159 mg/dL — ABNORMAL HIGH (ref 70–99)

## 2019-07-03 NOTE — ED Notes (Signed)
Pt ate sand which from meal tray as well as grapes and chips.

## 2019-07-03 NOTE — ED Provider Notes (Signed)
-----------------------------------------   6:03 AM on 07/03/2019 -----------------------------------------   Blood pressure (!) 154/106, pulse 78, temperature 97.8 F (36.6 C), temperature source Oral, resp. rate 20, height 5\' 4"  (1.626 m), weight 112 kg, SpO2 100 %.  The patient is sleeping at this time.  There have been no acute events since the last update.  Awaiting disposition plan from Social Work team.   Paulette Blanch, MD 07/03/19 [phone]

## 2019-07-04 NOTE — ED Provider Notes (Signed)
-----------------------------------------   6:49 AM on 07/04/2019 -----------------------------------------   Blood pressure 137/80, pulse 61, temperature 98.4 F (36.9 C), temperature source Oral, resp. rate 16, height 5\' 4"  (1.626 m), weight 112 kg, SpO2 94 %.  The patient is calm and cooperative at this time.  There have been no acute events since the last update.  Awaiting disposition plan from Behavioral Medicine and/or Social Work team(s).    Harvest Dark, MD 07/04/19 [phone]

## 2019-07-04 NOTE — ED Notes (Signed)
Report received from [place], [place]. Pt is resting comfortably in the room. Pt laying on hospital bed. Pt in NAD at this time. No complaints at this time. Hospital bed locked in the lowest position. Will continue to monitor.

## 2019-07-05 LAB — GLUCOSE, CAPILLARY
Glucose-Capillary: 89 mg/dL (ref 70–99)
Glucose-Capillary: 99 mg/dL (ref 70–99)

## 2019-07-05 MED ORDER — HALOPERIDOL DECANOATE 100 MG/ML IM SOLN
100.0000 mg | INTRAMUSCULAR | Status: DC
  Administered 2019-07-05: 100 mg via INTRAMUSCULAR
  Filled 2019-07-05: qty 1

## 2019-07-05 NOTE — ED Notes (Signed)
Pt alert, resting calmly in bed, denies any needs. Bed locked low. Rail up. Call bell within reach.

## 2019-07-05 NOTE — ED Notes (Signed)
TV adjusted for pt.

## 2019-07-05 NOTE — ED Notes (Signed)
Pt given half diet half regular iced gingerale. Pt declines snack.

## 2019-07-05 NOTE — ED Notes (Signed)
Patient up moving around room performing morning care of washing up and changing gowns.

## 2019-07-05 NOTE — ED Notes (Signed)
Patient ambulatory to restroom at this time.  Will continue to monitor.

## 2019-07-05 NOTE — ED Notes (Addendum)
Pt given another large cup of iced water by Caitlyn NT.

## 2019-07-05 NOTE — ED Notes (Addendum)
Pt asleep. Chest rise and fall visible.  

## 2019-07-05 NOTE — Social Work (Addendum)
CSW left voicemail for patient's sister, [name], regarding plan of care/barrriers.   12:08pm - [name] from [place] shared that she has not heard of family members willing to pick patient up. Patient's family told [name] that they were unwilling to pick patient up even if she had to live on the streets. [name] stated that she will try to call [name] to see if she has changed her mind, and call CSW back.   1:57pm - [name] shared that she will attempt to contact [employer] concerning emergency placement.   Barrier: No Medicaid to pay for group home.  [place], [place] ED  [phone]

## 2019-07-05 NOTE — ED Notes (Signed)
Pt given diet drink and diet food tray.

## 2019-07-05 NOTE — ED Notes (Addendum)
Pt repositioned in bed. Reminded to use call bell to notify this RN before getting up alone to use restroom or otherwise. Pt steady on feet. Cane within reach. Used teach back method. Pt agrees to use call bell and not get up alone.

## 2019-07-05 NOTE — ED Notes (Signed)
Patient ate 100% of her breakfast this am.  Patient is alert and oriented to day and date, place, self and situation.  Patient is pleasant and in no obvious distress at this time.

## 2019-07-05 NOTE — ED Notes (Signed)
Pt repositioned in bed. Denies any other needs. Bed locked low. Call bell within reach. Pt states she will use bell if she needs this RN. Rail up.

## 2019-07-05 NOTE — ED Notes (Signed)
Pt resting calmly in bed. Bed locked low. Rail up. Call bell within reach. Cane within reach.

## 2019-07-05 NOTE — ED Notes (Signed)
Pt resting calmly in bed. Denies any needs currently. Cane within reach. Bed locked low. Call bell within reach. Rail up.

## 2019-07-05 NOTE — ED Provider Notes (Signed)
-----------------------------------------   4:54 AM on 07/05/2019 -----------------------------------------   Blood pressure (!) 158/98, pulse 75, temperature 97.9 F (36.6 C), resp. rate 18, height 1.626 m (5\' 4" ), weight 112 kg, SpO2 100 %.  The patient is calm and cooperative at this time.  There have been no acute events since the last update.  Awaiting disposition plan from Behavioral Medicine and/or Social Work team(s).   Hinda Kehr, MD 07/05/19 [phone]

## 2019-07-05 NOTE — ED Notes (Addendum)
Pt c/o L knee pain; states it gets stiff sometimes and stretching it out helps. This RN assisted pt with ROM exercises. Pt repositioned. Pt offered chance to use restroom and offeed snack/drink; pt refused.

## 2019-07-05 NOTE — ED Notes (Signed)
Pt up to bedside toilet. Urinated.

## 2019-07-05 NOTE — ED Notes (Signed)
Santiago Glad from patient's ACT team called this RN and stated that patient's Haldol Deconoate IM injection was due today.  Patient receives this q 30 days.  MD notified and order placed.

## 2019-07-05 NOTE — ED Provider Notes (Signed)
-----------------------------------------   4:00 AM on 07/06/2019 -----------------------------------------  Blood pressure 123/85, pulse 73, temperature (!) 97.4 F (36.3 C), temperature source Oral, resp. rate 16, height 5\' 4"  (1.626 m), weight 112 kg, SpO2 97 %.  The patient is calm and cooperative at this time.  There have been no acute events since the last update.  Awaiting disposition plan from Behavioral Medicine team.   Blake Divine, MD 07/06/19 0400

## 2019-07-06 NOTE — ED Notes (Signed)
Pt provided phone upon request. 

## 2019-07-06 NOTE — ED Notes (Signed)
Pt resting calmly in bed. Lights dimmed. Denies any needs.

## 2019-07-06 NOTE — ED Notes (Signed)
Patient called sister on nurse phone.

## 2019-07-06 NOTE — ED Notes (Signed)
Breakfast tray provided; patient able to feed self.

## 2019-07-06 NOTE — Social Work (Addendum)
CSW attempted to contact [facility] financial counselor for assistance in getting patient Medicaid for a group home. Voicemail left. Will try to call again later.   11:10am Lizbeth Bark, financial counselor at [facility] returned call to [place], and she provided CSW with contacts at [place] that can possibly assist with getting patient special assistance Medicaid.    Lizbeth Bark included CSW in email to DSS workers for assistance with this patient. Will provide any updates.   2:20pm - CSW faxed patient to [facility] at  [phone]. Awaiting decision.      [place], [place] ED  [phone]

## 2019-07-06 NOTE — ED Notes (Signed)
Awaiting social consult and placement. Scheduled meds given, ate 90% of her dinner. Patient in bed watch ing tv with NAD. Will continue to monitor. Safety maintained.

## 2019-07-06 NOTE — ED Notes (Signed)
Patient ambulatory to shower independently.

## 2019-07-06 NOTE — ED Notes (Signed)
Pt sleeping. Chest rise and fall visible.

## 2019-07-06 NOTE — ED Notes (Signed)
Pt resting calmly in bed. Remote to TV within reach. Call bell within reach. Bed locked low. Rail up.

## 2019-07-06 NOTE — ED Notes (Signed)
Patient out of bed to shower and change clothing. Safety maintained. Patient denies any discomforts or pain.
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
## 2019-07-06 NOTE — ED Notes (Signed)
Scheduled meds given

## 2019-07-07 NOTE — ED Notes (Signed)
Pt resting comfortably.  Skin warm and dry.  Bathroom offered but pt declined at this time.  Will continue to monitor.  Pt will use call bell for any needs that arise.

## 2019-07-07 NOTE — ED Notes (Signed)
Scheduled med given

## 2019-07-07 NOTE — ED Notes (Signed)
CSW working on placement
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
## 2019-07-07 NOTE — ED Notes (Signed)
Pt ambulatory to toilet with cane, no difficulty noted.

## 2019-07-07 NOTE — ED Notes (Signed)
Report received from Darl Householder

## 2019-07-07 NOTE — Social Work (Signed)
CSW followed up with [facility], and was informed that I will "receive a call if there is room."     Lauren [name]  [facility] ED  [phone]

## 2019-07-07 NOTE — ED Notes (Signed)
patient sitting on side of bed eating lunch.

## 2019-07-07 NOTE — ED Notes (Signed)
Report given to [place] RN

## 2019-07-07 NOTE — ED Notes (Signed)
Pt resting quietly at this time with eyes closed, respirations equal and unlabored.  

## 2019-07-07 NOTE — ED Notes (Signed)
Dinner tray provided. Patient encouraged to sit out of bed for dinner.

## 2019-07-07 NOTE — ED Notes (Signed)
Pt provided breakfast tray; able to feed self. 

## 2019-07-07 NOTE — ED Provider Notes (Signed)
-----------------------------------------   5:37 AM on 07/07/2019 -----------------------------------------   Blood pressure (!) 153/88, pulse 62, temperature 98.3 F (36.8 C), temperature source Oral, resp. rate 16, height 1.626 m (5\' 4" ), weight 112 kg, SpO2 100 %.  The patient is calm and cooperative at this time.  There have been no acute events since the last update.  Awaiting disposition plan from Behavioral Medicine and/or Social Work team(s).   Hinda Kehr, MD 07/07/19 [phone]

## 2019-07-07 NOTE — ED Notes (Signed)
Patient asking to speak with Social Work; [place] notified, states they will see her today.

## 2019-07-07 NOTE — ED Notes (Signed)
Patient requesting to see social worker, Social worker called and will come see patient and update on status.

## 2019-07-07 NOTE — ED Notes (Signed)
Patient sleeping comfortably, vss. Safety maintained.

## 2019-07-07 NOTE — ED Notes (Signed)
Pt resting quietly with eyes closed, room darkened, respirations equal and unlabored.

## 2019-07-08 NOTE — Social Work (Addendum)
Willette Brace [phone]) from [facility] came to interview patient for possible placement. Doroteo Bradford shared that the only concern would be the cost of her medications since patient does not make enough to live in their home. Erika asked that [facility] be faxed to her at [phone], so she can call a pharmacy to get an approximate cost of the medication.    Update: [name] from [place] shared that she received information from her previous group home and the cost for her medications was "$44 a month without the injection. The copay for the injection is $100.  The injection can be purchased at [facility] for $35."    CSW will relay this information to Willette Brace.   11:12am - CSW provided [place] with information regarding patient's medications. Doroteo Bradford shared that she will contact the owner of the group home, and will give me a call with their decision.   4:50pm - CSW provided update to [place] from [place]. [name] shared that [facility] group home is waiting to see if [employer] will pay for patient's stay there; she will keep CSW updated.   [place], [place] ED  [phone]

## 2019-07-08 NOTE — ED Provider Notes (Signed)
-----------------------------------------   6:18 AM on 07/08/2019 -----------------------------------------   Blood pressure (!) 145/83, pulse 63, temperature 98.6 F (37 C), temperature source Oral, resp. rate 18, height 5\' 4"  (1.626 m), weight 112 kg, SpO2 98 %.  The patient is calm and cooperative at this time.  There have been no acute events since the last update.  Awaiting disposition plan from Behavioral Medicine and/or Social Work team(s).    Nena Polio, MD 07/08/19 [phone]

## 2019-07-09 NOTE — ED Notes (Signed)
Pt taken to bathroom by EDT Wilfred Lacy.

## 2019-07-09 NOTE — ED Notes (Signed)
Pt placed in hallway bed. No needs expressed.

## 2019-07-09 NOTE — ED Notes (Signed)
Pt ambulated from wheelchair to toilet with cane, pt has no complaints, linens straightened out and pt returned to bed. Offered pt something to drink but she declines

## 2019-07-09 NOTE — ED Notes (Signed)
Pt assisted to toilet to void 

## 2019-07-09 NOTE — ED Notes (Signed)
Lauren [name] [id number] called to speak with pt - phone given to pt

## 2019-07-09 NOTE — ED Notes (Signed)
Hand and face hygiene Breakfast tray and juice given to pt

## 2019-07-09 NOTE — ED Notes (Signed)
[facility] [phone] and [phone]  Humberto Leep [phone]

## 2019-07-09 NOTE — ED Notes (Signed)
Hand hygiene performed  Pt given lunch tray with ginger ale  Pt sitting on side of bed for meal

## 2019-07-09 NOTE — ED Notes (Signed)
Pt provided with a meal tray and a fluids. Pt A/Ox4. Pt able to feed self.

## 2019-07-09 NOTE — ED Notes (Signed)
This Rn message pharmacy regarding medication due . Awaiting on medication to be sent from pharmacy.

## 2019-07-09 NOTE — ED Notes (Signed)
Pt taken to bathroom on wheelchair by RN Janett Billow. NAD noted

## 2019-07-09 NOTE — ED Provider Notes (Signed)
-----------------------------------------   5:55 AM on 07/09/2019 -----------------------------------------   Blood pressure (!) 163/94, pulse 63, temperature (!) 96.7 F (35.9 C), temperature source Axillary, resp. rate 16, height 5\' 4"  (1.626 m), weight 112 kg, SpO2 98 %.  The patient is sleeping at this time.  There have been no acute events since the last update.  Awaiting disposition plan from Social Work team.   Paulette Blanch, MD 07/09/19 [phone]

## 2019-07-10 LAB — GLUCOSE, CAPILLARY: Glucose-Capillary: 86 mg/dL (ref 70–99)

## 2019-07-10 NOTE — ED Notes (Signed)
Pt visualized resting in bed, NAD noted. Will continue to monitor.

## 2019-07-10 NOTE — ED Notes (Signed)
Pt given lunch box and set up to eat.

## 2019-07-10 NOTE — ED Provider Notes (Signed)
-----------------------------------------   8:19 AM on 07/10/2019 -----------------------------------------  BP 138/69 (BP Location: Left Arm)   Pulse 66   Temp (!) 96.7 F (35.9 C) (Axillary)   Resp 18   Ht 5\' 4"  (1.626 m)   Wt 112 kg   SpO2 97%   BMI 42.40 kg/m   The patient is sleeping at this time.  There have been no acute events since the last update.  Awaiting disposition plan from Social Work team.   Lilia Pro., MD 07/10/19 [phone]

## 2019-07-11 NOTE — ED Notes (Signed)
Pt up to use bathroom 

## 2019-07-11 NOTE — ED Notes (Signed)
Report given to Annie, RN

## 2019-07-11 NOTE — ED Notes (Signed)
Pt given water to drink. 

## 2019-07-11 NOTE — ED Notes (Signed)
Pt given breakfast tray

## 2019-07-11 NOTE — ED Notes (Signed)
Pt given lunch tray.

## 2019-07-11 NOTE — ED Notes (Addendum)
Pt resting quietly in bed in NAD

## 2019-07-11 NOTE — ED Notes (Signed)
Pt resting in bed comfortably- NAD noted

## 2019-07-11 NOTE — ED Notes (Addendum)
Sent pharmacy message for missing salagen- message sent back stating that it would be up shortly

## 2019-07-11 NOTE — ED Notes (Signed)
Pt requesting candy- informed pt that there are no candy machines and that there was no candy on the department

## 2019-07-11 NOTE — ED Provider Notes (Signed)
-----------------------------------------   6:55 AM on 07/11/2019 -----------------------------------------   Blood pressure 131/80, pulse 66, temperature 98.2 F (36.8 C), temperature source Oral, resp. rate 16, height 5\' 4"  (1.626 m), weight 112 kg, SpO2 96 %.  The patient is sleeping at this time.  There have been no acute events since the last update.  Awaiting disposition plan from Social Work team(s).   Paulette Blanch, MD 07/11/19 [phone]
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
## 2019-07-12 LAB — GLUCOSE, CAPILLARY: Glucose-Capillary: 91 mg/dL (ref 70–99)

## 2019-07-12 NOTE — ED Notes (Addendum)
Patient able to ambulate to and from wheelchair and to and from toilet with standby assistance. Cane available for patient, however she is not using at this time. Patient urinated only. Hands washed and then patient returned to bed. Patient requesting shower this morning. NT made aware and will get patient when shower is available.

## 2019-07-12 NOTE — ED Notes (Signed)
Patient given lunch tray. Sitting on side of bed eating at this time.

## 2019-07-12 NOTE — Evaluation (Signed)
Physical Therapy Evaluation Patient Details Name: Lauren [name] MRN: [id number] DOB: [birth year] Today's Date: 07/12/2019   History of Present Illness  From MD note 06/24/19:  Pt is a 68 y.o. female with a history of hypertension, diabetes, obesity, meningioma, schizoaffective disorder, baseline shortness of breath, stutter who presents via PD for a wellness check.  Patient resides at a group home and states that she was being treated poorly at the group home by other residents and therefore does not want to stay there.  She states the other residents make fun of her stutter and sometimes yell or holler at her. She denies any physical abuse. She left the group home of her own accord, started walking away, and therefore the group home called 911. She denies any SI or HI. She denies any pain aside from some knee pain related to having to walk so much. On further review by social work the patient apparently has several moments like this, where sometimes she likes her group home and wants to stay, and other moments where she does not want to stay.  They feel these mood swings are related to her known mental health.  Per CM/SW pt's group home decided not to accept the patient due to past behaviors.  Pt remains in ED awaiting disposition plan from Social Work team.    Clinical Impression  Pt in ED since 06/24/19. MD orders received for PT services to prevent functional decline while pt in ED awaiting disposition plan from SW.  Pt presented with minor deficits in strength, transfers, mobility, gait, balance, and activity tolerance but overall performed well during the session.  Pt was Mod I with bed mobility and transfers and was able to amb 200' with a SPC with SBA with only minor drifting but without LOB.  Pt's SpO2 and HR WNL during the session.  Will keep patient on PT caseload while in the hospital to prevent functional decline but no further PT services recommended once pt discharges.         Follow Up Recommendations No PT follow up    Equipment Recommendations  None recommended by PT    Recommendations for Other Services       Precautions / Restrictions Precautions Precautions: Fall Restrictions Weight Bearing Restrictions: No      Mobility  Bed Mobility Overal bed mobility: Modified Independent             General bed mobility comments: Extra time/effort during sup to/from sit but no physical assistance required  Transfers Overall transfer level: Modified independent Equipment used: Straight cane             General transfer comment: Good eccentric and concentric control with transfers  Ambulation/Gait Ambulation/Gait assistance: Supervision Gait Distance (Feet): 200 Feet x 1, 40 Feet x 1 Assistive device: Straight cane Gait Pattern/deviations: Step-through pattern;Decreased step length - right;Decreased step length - left;Drifts right/left Gait velocity: decreased   General Gait Details: Pt steady with amb with a SPC including during start/stops and head turns with only minor occasional drifting left/right  Stairs            Wheelchair Mobility    Modified Rankin (Stroke Patients Only)       Balance Overall balance assessment: Needs assistance Sitting-balance support: Single extremity supported;Feet supported Sitting balance-Leahy Scale: Good     Standing balance support: Single extremity supported Standing balance-Leahy Scale: Good  Pertinent Vitals/Pain Pain Assessment: No/denies pain    Home Living Family/patient expects to be discharged to:: Other (Comment)                 Additional Comments: Pt not accepted back to group home and currently awaiting discharge plan from SW team.    Prior Function Level of Independence: Independent with assistive device(s)         Comments: Mod Ind amb with a SPC community distances, Ind with ADLs, no falls in the last 6 months      Hand Dominance        Extremity/Trunk Assessment   Upper Extremity Assessment Upper Extremity Assessment: Overall WFL for tasks assessed    Lower Extremity Assessment Lower Extremity Assessment: Overall WFL for tasks assessed       Communication   Communication: No difficulties  Cognition Arousal/Alertness: Awake/alert Behavior During Therapy: WFL for tasks assessed/performed Overall Cognitive Status: Within Functional Limits for tasks assessed                                        General Comments      Exercises Total Joint Exercises Ankle Circles/Pumps: Strengthening;AROM;Both;10 reps Quad Sets: Strengthening;Both;10 reps Gluteal Sets: Strengthening;Both;10 reps Towel Squeeze: Strengthening;Both;10 reps Heel Slides: AROM;Both;5 reps Hip ABduction/ADduction: AROM;Both;10 reps Straight Leg Raises: AROM;Both;10 reps Long Arc Quad: Strengthening;Both;10 reps Knee Flexion: Strengthening;Both;10 reps Marching in Standing: AROM;Both;10 reps;Standing;Seated Other Exercises Other Exercises: HEP education/review for BLE [name], GS, QS, and LAQ x 10 each every 1-2 hours daily   Assessment/Plan    PT Assessment Patient needs continued PT services  PT Problem List Decreased strength;Decreased activity tolerance       PT Treatment Interventions DME instruction;Gait training;Therapeutic activities;Therapeutic exercise;Balance training;Patient/family education    PT Goals (Current goals can be found in the Care Plan section)  Acute Rehab PT Goals Patient Stated Goal: To find a place to live PT Goal Formulation: With patient Time For Goal Achievement: 07/25/19 Potential to Achieve Goals: Good    Frequency Min 2X/week   Barriers to discharge        Co-evaluation               AM-PAC PT "6 Clicks" Mobility  Outcome Measure Help needed turning from your back to your side while in a flat bed without using bedrails?: A Little Help needed  moving from lying on your back to sitting on the side of a flat bed without using bedrails?: None Help needed moving to and from a bed to a chair (including a wheelchair)?: None Help needed standing up from a chair using your arms (e.g., wheelchair or bedside chair)?: None Help needed to walk in hospital room?: None Help needed climbing 3-5 steps with a railing? : A Little 6 Click Score: 22    End of Session Equipment Utilized During Treatment: Gait belt Activity Tolerance: Patient tolerated treatment well Patient left: in bed;Other (comment)(Pt by nursing station in ED hallway left as found) Nurse Communication: Mobility status PT Visit Diagnosis: Muscle weakness (generalized) (M62.81)    Time: 0940-1005 PT Time Calculation (min) (ACUTE ONLY): 25 min   Charges:   PT Evaluation $PT Eval Low Complexity: 1 Low PT Treatments $Therapeutic Exercise: 8-22 mins        D. Scott Nell Gales PT, DPT 07/12/19, 1:30 PM

## 2019-07-12 NOTE — ED Notes (Signed)
Pt resting comfortably at this time. Pt in NAD

## 2019-07-12 NOTE — ED Notes (Signed)
Patient working with PT at this time.

## 2019-07-12 NOTE — ED Notes (Signed)
Charging pt's cell phone for her

## 2019-07-12 NOTE — ED Notes (Signed)
Pt resting comfortably at this time.

## 2019-07-12 NOTE — Social Work (Signed)
Lauren [name] from patient's ACT team shared that she reached out to [employer] for assistance, and was told to contact [facility] (they have already interviewed and declined patient) and Gunnar Fusi with [facility] [phone]).  CSW left a voicemail for [employer].    [place], [place] ED  [phone]

## 2019-07-12 NOTE — ED Notes (Signed)
Patient resting in bed with eyes closed. Even unlabored respirations noted.

## 2019-07-12 NOTE — ED Notes (Signed)
Patient resting in bed with eyes closed. Even, unlabored respirations noted at this time.

## 2019-07-12 NOTE — ED Notes (Signed)
Patient sitting up in bed eating breakfast at this time. All medications taken without issue. Patient denies further needs at this time.

## 2019-07-12 NOTE — ED Notes (Signed)
Patient resting in bed with eyes closed. Even, unlabored respirations noted.

## 2019-07-12 NOTE — ED Notes (Addendum)
Patient ambulatory to restroom with use of cane. Standby assistance only. Steady gait noted.

## 2019-07-12 NOTE — ED Notes (Signed)
Patient sitting on edge of bed eating dinner. No further needs expressed at this time.

## 2019-07-13 NOTE — ED Notes (Signed)
Patient sitting in chair, eating lunch. Denies needs.

## 2019-07-13 NOTE — Social Work (Addendum)
CSW attempted again to contact Gunnar Fusi with [facility] [phone]).  CSW left a voicemail for [employer].   3:00pm - Christiana Doss from [place] [facility] shared that referral for patient to have a DSS guardian is still being processed. Margreta Journey does not know when a decision will be made to come assess patient in the ED.   Margreta Journey agency cell:   [phone]  4:15pm - [name] from patient's ACT team asked questions about patient discharge plan. [name] shared that she will talk to the housing specialist with [facility] to begin looking into  "mental health homes" for patient.    [place], [place] ED  [phone]

## 2019-07-13 NOTE — ED Provider Notes (Signed)
-----------------------------------------   3:13 AM on 07/13/2019 -----------------------------------------   Blood pressure (!) 140/95, pulse 68, temperature 97.8 F (36.6 C), temperature source Oral, resp. rate 20, height 5\' 4"  (1.626 m), weight 112 kg, SpO2 100 %.  The patient had no acute events since last update.  Calm and cooperative at this time.  Disposition is pending per Psychiatry/Behavioral Medicine team recommendations.     Rudene Re, MD 07/13/19 [phone]

## 2019-07-13 NOTE — ED Notes (Addendum)
Pt taken to shower as requested, provided new gowns. Linens changed and bedside table tidied up. Pt given chair as per request to use while in shower. Ambulates safely with cane to shower.

## 2019-07-13 NOTE — ED Notes (Signed)
Patient is resting on stretcher. NAD.

## 2019-07-13 NOTE — ED Notes (Signed)
Patient given meal tray. Patient is sitting in recliner.

## 2019-07-13 NOTE — ED Notes (Signed)
Report given to Butch RN 

## 2019-07-13 NOTE — ED Notes (Signed)
Patient resting in recliner at this time. Even and non labored respirations noted. Will continue to monitor.

## 2019-07-13 NOTE — ED Notes (Signed)
Patient ambulated to and from hallway bathroom with a steady gait and use of cane.

## 2019-07-13 NOTE — ED Notes (Signed)
Patient continues to rest in recliner at this time. Patient will periodically ambulate in hall. Denies needs. Will continue to monitor.

## 2019-07-14 ENCOUNTER — Other Ambulatory Visit: Payer: Self-pay

## 2019-07-14 LAB — GLUCOSE, CAPILLARY: Glucose-Capillary: 87 mg/dL (ref 70–99)

## 2019-07-14 NOTE — ED Notes (Signed)
Pt in chair, seems comfortable and is eating food.

## 2019-07-14 NOTE — Care Management (Signed)
ED CM call to [name] @ [phone] to update that plan is for member to be discharging to homeless shelter. Christina requested CM update with specific location in order for guardian follow up. CM will update tomorrow once specific location is selected.

## 2019-07-14 NOTE — Progress Notes (Signed)
Physical Therapy Treatment Patient Details Name: Lauren [name] MRN: [id number] DOB: 27-Feb-1951 Today's Date: 07/14/2019    History of Present Illness From MD note 06/24/19:  Pt is a 68 y.o. female with a history of hypertension, diabetes, obesity, meningioma, schizoaffective disorder, baseline shortness of breath, stutter who presents via PD for a wellness check.  Patient resides at a group home and states that she was being treated poorly at the group home by other residents and therefore does not want to stay there.  She states the other residents make fun of her stutter and sometimes yell or holler at her. She denies any physical abuse. She left the group home of her own accord, started walking away, and therefore the group home called 911. She denies any SI or HI. She denies any pain aside from some knee pain related to having to walk so much. On further review by social work the patient apparently has several moments like this, where sometimes she likes her group home and wants to stay, and other moments where she does not want to stay.  They feel these mood swings are related to her known mental health.  Per CM/SW pt's group home decided not to accept the patient due to past behaviors.  Pt remains in ED awaiting disposition plan from Social Work team.    PT Comments    Pt ambulated with a SPC with SBA with combinations of head turns and start/stops with good stability and only mild drifting left/right.  Pt performed below static and dynamic balance training without LOB and only mild sway.  Will continue to keep patient on caseload secondary to prolonged search for discharge disposition to prevent functional decline but no PT follow up recommended after discharge.      Follow Up Recommendations  No PT follow up     Equipment Recommendations  None recommended by PT    Recommendations for Other Services       Precautions / Restrictions Precautions Precautions:  Fall Restrictions Weight Bearing Restrictions: No    Mobility  Bed Mobility               General bed mobility comments: NT, pt in recliner  Transfers Overall transfer level: Modified independent Equipment used: Straight cane             General transfer comment: Good eccentric and concentric control with transfers  Ambulation/Gait Ambulation/Gait assistance: Supervision Gait Distance (Feet): 200 Feet Assistive device: Straight cane Gait Pattern/deviations: Step-through pattern;Decreased step length - right;Decreased step length - left;Drifts right/left Gait velocity: decreased   General Gait Details: Dynamic gait training with start/stops and head turns with good stability throughout with only mild drifting left/right noted but no LOB   Stairs             Wheelchair Mobility    Modified Rankin (Stroke Patients Only)       Balance Overall balance assessment: Needs assistance Sitting-balance support: Single extremity supported;Feet supported Sitting balance-Leahy Scale: Good     Standing balance support: Single extremity supported Standing balance-Leahy Scale: Good                              Cognition Arousal/Alertness: Awake/alert Behavior During Therapy: WFL for tasks assessed/performed Overall Cognitive Status: Within Functional Limits for tasks assessed  Exercises Other Exercises Other Exercises: Dynamic gait training with start/stops and head turns Other Exercises: Static standing balance training without UE support with feet apart and together with combinations of eyes open/closed and head still/head turns Other Exercises: Dynamic standing balance training with reaching outside BOS with feet apart, together, and semi-tandem    General Comments        Pertinent Vitals/Pain Pain Assessment: No/denies pain    Home Living                      Prior Function             PT Goals (current goals can now be found in the care plan section) Progress towards PT goals: Progressing toward goals    Frequency    Min 2X/week      PT Plan Current plan remains appropriate    Co-evaluation              AM-PAC PT "6 Clicks" Mobility   Outcome Measure  Help needed turning from your back to your side while in a flat bed without using bedrails?: A Little Help needed moving from lying on your back to sitting on the side of a flat bed without using bedrails?: None Help needed moving to and from a bed to a chair (including a wheelchair)?: None Help needed standing up from a chair using your arms (e.g., wheelchair or bedside chair)?: None Help needed to walk in hospital room?: None Help needed climbing 3-5 steps with a railing? : A Little 6 Click Score: 22    End of Session Equipment Utilized During Treatment: Gait belt Activity Tolerance: Patient tolerated treatment well Patient left: in chair;Other (comment)(Pt in recliner next to nsg station in ED) Nurse Communication: Mobility status PT Visit Diagnosis: Muscle weakness (generalized) (M62.81)     Time: 1100-1115 PT Time Calculation (min) (ACUTE ONLY): 15 min  Charges:  $Therapeutic Exercise: 8-22 mins                     D. Scott Nancey Kreitz PT, DPT 07/14/19, 12:07 PM

## 2019-07-14 NOTE — ED Notes (Addendum)
Requested missed dose of pilocarpine from pharmacy, will administer when arrives.  Pt ate 100% of her breakfast.

## 2019-07-14 NOTE — ED Notes (Signed)
Pt resting comfortably, no signs of distress, respirations normal and unlabored.

## 2019-07-14 NOTE — ED Notes (Signed)
Pharmacy called and notified that I need gabapentin and pilocarpine sent up to ED pyxis for this pt, pharmacy states that they would send medications up.

## 2019-07-14 NOTE — ED Notes (Signed)
Pt in chair, appears comfortable, and no signs of distress. States that she was waiting for her son to call her.

## 2019-07-14 NOTE — ED Notes (Addendum)
Pt ambulating around without any difficulty. Pt given applesauce and italian ice. No further needs at this time.

## 2019-07-14 NOTE — ED Notes (Signed)
Pt given lunch and drink.  

## 2019-07-14 NOTE — ED Notes (Signed)
Pt states that she is comfortable, states that she is ready to go to sleep. This nurse helped pt into bed and gave her warm blankets.

## 2019-07-14 NOTE — ED Provider Notes (Signed)
-----------------------------------------   6:01 AM on 07/14/2019 -----------------------------------------   Blood pressure (!) 150/81, pulse 69, temperature 97.7 F (36.5 C), temperature source Oral, resp. rate 18, height 5\' 4"  (1.626 m), weight 112 kg, SpO2 100 %.  The patient is calm and cooperative at this time.  There have been no acute events since the last update.  Awaiting disposition plan from behavioral medicine/social Work team(s).   Paulette Blanch, MD 07/14/19 [phone]

## 2019-07-14 NOTE — ED Notes (Signed)
PT at bedside to work with patient.

## 2019-07-14 NOTE — ED Notes (Signed)
Pt up to restroom with cane. Steady gait with no assistance.

## 2019-07-14 NOTE — ED Notes (Signed)
Hourly rounding reveals patient in bed asleep (eyes closed, equal unlabored RR).  No complaints, stable, in no acute distress. Hourly rounds and monitoring via [employer] to continue.

## 2019-07-14 NOTE — ED Notes (Addendum)
Pt back to bed. Nothing needed from staff at this time

## 2019-07-14 NOTE — ED Notes (Signed)
Pt ambulatory to the bathroom with her cane. Pt asked if she is ok. Pt verbalizes she is doing fine.

## 2019-07-14 NOTE — ED Notes (Signed)
Pt ambulated back to 1H and sat in recliner. Pt given warm blankets. Pt has no further needs at this time.

## 2019-07-14 NOTE — ED Notes (Signed)
Pt taken to shower by ED staff.

## 2019-07-14 NOTE — ED Notes (Signed)
Blood sugar checked and breakfast tray given. Patient sitting up in recliner eating at this time. No further needs expressed.

## 2019-07-14 NOTE — ED Notes (Addendum)
Hourly rounding reveals patient in bed asleep, eyes closed with equal and unlabored RR.  No complaints, stable, in no acute distress. Will continue to monitor, nothing needed from staff at this time

## 2019-07-14 NOTE — ED Notes (Signed)
Patient is resting comfortably. 

## 2019-07-14 NOTE — ED Notes (Signed)
Pt resting comfortably, no signs of distress.

## 2019-07-14 NOTE — ED Notes (Signed)
Pt ate 100% of meal tray 

## 2019-07-14 NOTE — Patient Outreach (Signed)
[facility]) Care Management  07/14/2019  Tomorrow [facility] 1951/05/25 [id number]   Medication Adherence call to Mrs. Flossie Dibble patients telephone number belongs to home care but,home care is no longer there no other phone on file,patient is past due on Atorvastatin 40 mg and Lisinopril 20 mg under [facility].   [place] Management Direct Dial [phone]  Fax [phone] [email or web address]

## 2019-07-14 NOTE — ED Notes (Signed)
Pt on personal cell phone laughing and talking. Pt ambulates to and from bathroom independently and without difficulty.

## 2019-07-14 NOTE — ED Notes (Signed)
Pt ambulated to quad shower without difficulty. Pt showering at this time. Pt bed linen changed.

## 2019-07-15 NOTE — ED Provider Notes (Signed)
-----------------------------------------   6:55 AM on 07/15/2019 -----------------------------------------   Blood pressure (!) 156/90, pulse 61, temperature 98 F (36.7 C), temperature source Oral, resp. rate 16, height 5\' 4"  (1.626 m), weight 112 kg, SpO2 100 %.  The patient is calm and cooperative at this time.  There have been no acute events since the last update.  Awaiting disposition plan from Behavioral Medicine and/or Social Work team(s).   Paulette Blanch, MD 07/15/19 [phone]

## 2019-07-15 NOTE — ED Notes (Signed)
Pt provided lunch tray; able to feed self. 

## 2019-07-15 NOTE — ED Notes (Signed)
Pt ambulatory to bathroom independently

## 2019-07-15 NOTE — ED Notes (Signed)
Patient is resting comfortably. 

## 2019-07-15 NOTE — ED Notes (Signed)
Pt ambulatory in hallway independently.

## 2019-07-15 NOTE — ED Notes (Signed)
Pt ambulatory to shower independently.

## 2019-07-15 NOTE — Care Management (Signed)
ED CM left voicemail message for [facility] @ [phone] requesting callback to set up lodging.

## 2019-07-15 NOTE — ED Notes (Signed)
Patient given breakfast tray. Sitting up in chair eating at this time.

## 2019-07-15 NOTE — ED Notes (Signed)
Pt provided dinner tray; able to feed self. 

## 2019-07-15 NOTE — ED Notes (Signed)
Pt sleeping and resting comfortably. No signs of distress and respirations are normal and unlabored.

## 2019-07-15 NOTE — ED Notes (Signed)
Report received from end-shift RN. Patient care assumed. Patient/RN introduction complete. Will continue to monitor.  

## 2019-07-15 NOTE — ED Notes (Signed)
Pt resting comfortably at this time with eyes closed, no distress noted.

## 2019-07-15 NOTE — Care Management (Signed)
ED CM: Talked with patient regarding discharge options including homeless shelter and motel lodging. Patient states she is fine with either one. Reviewed importance of transportation and ability to manage financial information. Patient states her check is still at previous group home but she can coordinate with her sisters for PCP follow up appointments. RN CM contacted Ladona Mow Teams team lead @ [phone] and reviewed options. [name] states she confirmed homeless shelter has no capacity and is unable to accommodate residents with income. Discussed option of lodging arrangements with [facility] that are arranged for $650/ monthly including utilities. [name] agreed to outreach to previous group home and get clarification for check arrangements.  ED CM left voicemail message for [facility] @ [phone] requesting callback to set up lodging.

## 2019-07-16 NOTE — Progress Notes (Signed)
PT Cancellation Note  Patient Details Name: Lizabeth Osting MRN: [id number] DOB: 1951-05-14   Cancelled Treatment:    Reason Eval/Treat Not Completed: Other (comment); Per nursing notes pt is ambulating independently in the hallways, to the bathroom, and to the shower.  Will complete PT orders at this time but will reassess pt pending a change in status upon receipt of new PT orders, nursing contacted and aware.    Linus Salmons PT, DPT 07/16/19, 4:59 PM

## 2019-07-16 NOTE — Care Management (Signed)
ED CM left voicemail message for [facility] @ [phone] requesting callback to set up lodging.  Attempted to call front desk @ [phone] however no answer and no option to leave message.

## 2019-07-16 NOTE — ED Provider Notes (Signed)
12:34 AM on 11/6  Blood pressure 136/87, pulse 66, temperature 97.9 F (36.6 C), temperature source Oral, resp. rate (!) 24, height 5\' 4"  (1.626 m), weight 112 kg, SpO2 98 %.  The patient is calm and cooperative at this time.  There have been no acute events since the last update.  Awaiting disposition plan from Behavioral Medicine and/or Social Work team(s).       Vanessa , MD 07/16/19 [phone]

## 2019-07-17 LAB — GLUCOSE, CAPILLARY: Glucose-Capillary: 87 mg/dL (ref 70–99)

## 2019-07-17 NOTE — ED Notes (Signed)
Pt given lunch tray at this time

## 2019-07-17 NOTE — ED Notes (Signed)
Pt is sleeping bed, no signs of distress. Respiratory pattern is normal and unlabored.

## 2019-07-17 NOTE — ED Notes (Signed)
This nurse assisted pt with showering. Pt was changed into new gown and undergarments.

## 2019-07-17 NOTE — ED Notes (Signed)
Pt visualized ambulatory from the bathroom at this time, pt visualized wiping her face with a wet wash cloth at this time.

## 2019-07-17 NOTE — ED Notes (Signed)
NAD noted at this time, pt given meal tray at this time.

## 2019-07-17 NOTE — ED Notes (Signed)
Pt back in bed from shower, this nurse helped pt apply lotion to dry skin.

## 2019-07-17 NOTE — ED Notes (Signed)
Pt visualized in NAD at this time. Pt ambulatory without difficulty at this time. Will continue to monitor for further patient needs.

## 2019-07-17 NOTE — ED Notes (Signed)
Pt visualized walking around at this time, NAD noted at this time. Will continue to monitor.

## 2019-07-17 NOTE — ED Notes (Signed)
NAD noted at this time. Pt visualized in NAD, resting in bed. Ambulatory back and forth around bed without difficulty.

## 2019-07-17 NOTE — ED Notes (Signed)
Pt is awake, states that she feels depressed and when ask why she states that because a lady is trying to "hold off on paying her the money that she owes". Pt otherwise seems in a good mood.

## 2019-07-17 NOTE — ED Notes (Signed)
Pt ambulated to bathroom, walks with steady gait and independently.

## 2019-07-17 NOTE — ED Notes (Signed)
Pt visualized in NAD at this time, pt continues to sit in bed eating her dinner.

## 2019-07-17 NOTE — ED Notes (Signed)
Walked pt to hallway bathroom. Pt ambulatory independently

## 2019-07-17 NOTE — ED Provider Notes (Signed)
-----------------------------------------   5:52 AM on 07/17/2019 -----------------------------------------   Blood pressure (!) 152/92, pulse 69, temperature 98.3 F (36.8 C), temperature source Oral, resp. rate 16, height 5\' 4"  (1.626 m), weight 112 kg, SpO2 99 %.  The patient is sleeping at this time.  There have been no acute events since the last update.  Awaiting disposition plan from Behavioral Medicine and/or Social Work team(s).   Paulette Blanch, MD 07/17/19 [phone]

## 2019-07-18 LAB — BASIC METABOLIC PANEL
Anion gap: 9 (ref 5–15)
BUN: 14 mg/dL (ref 8–23)
CO2: 25 mmol/L (ref 22–32)
Calcium: 9.2 mg/dL (ref 8.9–10.3)
Chloride: 105 mmol/L (ref 98–111)
Creatinine, Ser: 0.78 mg/dL (ref 0.44–1.00)
GFR calc Af Amer: >60 mL/min (ref >60)
GFR calc non Af Amer: >60 mL/min (ref >60)
Glucose, Bld: 99 mg/dL (ref 70–99)
Potassium: 3.7 mmol/L (ref 3.5–5.1)
Sodium: 139 mmol/L (ref 135–145)

## 2019-07-18 LAB — GLUCOSE, CAPILLARY: Glucose-Capillary: 96 mg/dL (ref 70–99)

## 2019-07-18 NOTE — ED Provider Notes (Signed)
-----------------------------------------   8:56 AM on 07/18/2019 -----------------------------------------   Blood pressure (!) 161/90, pulse (!) 59, temperature 97.6 F (36.4 C), temperature source Oral, resp. rate (!) 24, height 5\' 4"  (1.626 m), weight 112 kg, SpO2 100 %.  The patient is calm and cooperative at this time.  There have been no acute events since the last update.  Awaiting disposition plan from Behavioral Medicine and/or Social Work team(s).    Nena Polio, MD 07/18/19 [phone]

## 2019-07-18 NOTE — ED Notes (Signed)
Pt resting in hospital bed, pt given phone number that sister left with Caryl Pina, RN. Pt denies pain, pt states she feels sleepy. Pt states she does not need to use restroom at this time. Pt unsure of where she is going after discharge, states "I am going to a hotel but someone is holding my check to pay bills so I don't have my money." Pt alert and oriented, pt reminded to not get OOB without assistance.

## 2019-07-18 NOTE — ED Notes (Signed)
Pt awake and ambulating to restroom.

## 2019-07-18 NOTE — ED Notes (Signed)
Pt alert, able to sit up and take meds with water. Pt appears comfortable.

## 2019-07-18 NOTE — ED Notes (Signed)
p Pt ambulated to hall bathroom with cane, gait steady.

## 2019-07-18 NOTE — ED Notes (Signed)
Pt asking to take a shower. This tech gathered necessary supplies for pt to shower. Pt ambulated to quad shower without any difficulty. Bed linen changed.

## 2019-07-18 NOTE — ED Notes (Signed)
Pt given meal tray.

## 2019-07-18 NOTE — ED Notes (Signed)
Pt given meal tray, ate 80% independently with minimal setup.

## 2019-07-19 MED ORDER — CALCIUM CARBONATE ANTACID 500 MG PO CHEW
1.0000 | CHEWABLE_TABLET | Freq: Once | ORAL | Status: AC
  Administered 2019-07-19: 200 mg via ORAL
  Filled 2019-07-19: qty 1

## 2019-07-19 NOTE — Social Work (Addendum)
CSW spoke with patient's ACT team staff, [name]. [name] shared that the previous group home owner, Ms. Laurance Flatten, still has her check. [name] will try to contact the [facility] due to her having to screen another client of hers that lives there. She will keep CSW updated.  Possible better number for Dellis Anes   [phone]  CSW called, no answer. No option to leave voicemail.   CSW updated patient, and answered any questions that she had.    [place], [place] [facility]  [phone]

## 2019-07-19 NOTE — ED Notes (Signed)
Pharmacy called about missing meds. Will send.

## 2019-07-19 NOTE — ED Notes (Signed)
Patient resting in bed with eyes closed. Even, unlabored respirations noted.

## 2019-07-19 NOTE — ED Notes (Signed)
Pt up to restroom.

## 2019-07-19 NOTE — ED Provider Notes (Signed)
-----------------------------------------   6:13 AM on 07/19/2019 -----------------------------------------   Blood pressure (!) 157/89, pulse 64, temperature 97.9 F (36.6 C), temperature source Oral, resp. rate 16, height 5\' 4"  (1.626 m), weight 112 kg, SpO2 99 %.  The patient had no acute events since last update.  Calm and cooperative at this time.  Disposition is pending per Psychiatry/Behavioral Medicine team recommendations.    Rudene Re, MD 07/19/19 [phone]

## 2019-07-19 NOTE — ED Notes (Signed)
Pt requesting tums for heartburn. See new orders.

## 2019-07-19 NOTE — ED Notes (Signed)
Patient sitting on edge of bed eating breakfast. No complaints. No further needs expressed at this time.

## 2019-07-19 NOTE — ED Notes (Signed)
Pt given meal tray and ate 100% of it. Pt up to use restroom independently.

## 2019-07-19 NOTE — ED Notes (Signed)
Patient ambulating around hallway. Patient has steady gait with use of cane.

## 2019-07-19 NOTE — ED Notes (Signed)
Patient given lunch tray. Sitting up in bed eating. No further needs expressed at this time.

## 2019-07-19 NOTE — Progress Notes (Signed)
   07/19/19 1000  Clinical Encounter Type  Visited With Patient  Visit Type Follow-up  Spiritual Encounters  Spiritual Needs Prayer;Emotional   Chaplain visited the patient during routine rounds in the ED. Upon arrival to the patient's area, she was standing and repositioning her bedside tray. The patient was pleasant and welcoming. At first glance, she shared that she thought I was one of the Social Workers, which was a Market researcher. The patient shared that she is awaiting word about placement in a new facility or group home and is would like prayer. The patient shared about her family or origin, where she grew up, and her son. This chaplain offered prayer for the patient's well-being, placement, health, and family. The patient expressed gratitude for the spiritual support offered and would appreciate follow-up care.

## 2019-07-19 NOTE — ED Notes (Signed)
Patient ambulatory to use shower. Provided with new gowns and underwear. Patient given toothbrush and toothpaste, soap and deodorant. Patient able to wash self. Returned to bed and resting comfortably at this time.

## 2019-07-20 ENCOUNTER — Inpatient Hospital Stay: Payer: Medicare Other | Attending: Oncology

## 2019-07-20 LAB — GLUCOSE, CAPILLARY: Glucose-Capillary: 96 mg/dL (ref 70–99)

## 2019-07-20 MED ORDER — ASPIRIN EC 81 MG PO TBEC: 81.0000 mg | DELAYED_RELEASE_TABLET | Freq: Every day | ORAL | 5 refills | Status: DC

## 2019-07-20 MED ORDER — LEVETIRACETAM 500 MG PO TABS: 500.0000 mg | ORAL_TABLET | Freq: Two times a day (BID) | ORAL | 2 refills | Status: DC

## 2019-07-20 MED ORDER — LISINOPRIL 20 MG PO TABS: 20.0000 mg | ORAL_TABLET | Freq: Every day | ORAL | 5 refills | Status: DC

## 2019-07-20 MED ORDER — GABAPENTIN 100 MG PO CAPS: 100.0000 mg | ORAL_CAPSULE | Freq: Two times a day (BID) | ORAL | 1 refills | Status: DC

## 2019-07-20 MED ORDER — BENZTROPINE MESYLATE 1 MG PO TABS: 1.0000 mg | ORAL_TABLET | Freq: Two times a day (BID) | ORAL | 2 refills | Status: DC

## 2019-07-20 MED ORDER — HYDROCHLOROTHIAZIDE 12.5 MG PO TABS: 12.5000 mg | ORAL_TABLET | Freq: Every day | ORAL | 0 refills | Status: DC

## 2019-07-20 MED ORDER — PILOCARPINE HCL 5 MG PO TABS: 5.0000 mg | ORAL_TABLET | Freq: Three times a day (TID) | ORAL | 2 refills | Status: AC

## 2019-07-20 MED ORDER — METFORMIN HCL 500 MG PO TABS: 500.0000 mg | ORAL_TABLET | Freq: Two times a day (BID) | ORAL | 2 refills | Status: DC

## 2019-07-20 MED ORDER — TRAZODONE HCL 50 MG PO TABS: 50.0000 mg | ORAL_TABLET | Freq: Every day | ORAL | 3 refills | Status: DC

## 2019-07-20 MED ORDER — POTASSIUM CHLORIDE ER 10 MEQ PO TBCR: 10.0000 meq | EXTENDED_RELEASE_TABLET | Freq: Two times a day (BID) | ORAL | 0 refills | Status: DC

## 2019-07-20 MED ORDER — ATORVASTATIN CALCIUM 40 MG PO TABS: 40.0000 mg | ORAL_TABLET | Freq: Every day | ORAL | 2 refills | Status: DC

## 2019-07-20 MED ORDER — ESCITALOPRAM OXALATE 10 MG PO TABS: 10.0000 mg | ORAL_TABLET | Freq: Every day | ORAL | 1 refills | Status: DC

## 2019-07-20 NOTE — ED Notes (Signed)
Patient is resting comfortably. 

## 2019-07-20 NOTE — ED Notes (Signed)
Pt resting quietly.

## 2019-07-20 NOTE — Social Work (Signed)
CSW left message for Gillermo Murdoch at [facility] shelter to see if patient would be able to stay there for a daily feel.     CSW also contacted [name] at DSS to get an update on referral for guardianship. Margreta Journey stated that referral was made, and will provide information on the status of it. CSW also asked her about information regarding hotels that provide monthly stays.  CSW provided with : [facility], and [employer] in [place], [place] ED  [phone]

## 2019-07-20 NOTE — ED Notes (Signed)
Pt being discharged to [facility], social work very involved in patient discharge. Pt being met by ACT team member and medications being brought by ACT team person today to patient in hotel and to help with food. Hotel staff confirm room and are expecting patient per social work.

## 2019-07-20 NOTE — ED Provider Notes (Signed)
-----------------------------------------   3:55 PM on 07/20/2019 -----------------------------------------  Blood pressure (!) 154/91, pulse 77, temperature 98 F (36.7 C), temperature source Oral, resp. rate 16, height 5\' 4"  (1.626 m), weight 112 kg, SpO2 100 %.  The patient is calm and cooperative at this time.  There have been no acute events since the last update.  Awaiting disposition plan from [place] team.  Social work has arranged for patient to stay at the [facility].  She reportedly does not have any of her medications available, however her ACT will be able to pick them up for her from the pharmacy.  I have refilled medications that she has been receiving here in the ED.  Patient agrees with plan.   Blake Divine, MD 07/20/19 [phone]

## 2019-07-20 NOTE — Care Management (Addendum)
ED RN CM: Call to [facility] confirming patient is discharging from hospital today. Claiborne Billings requested patient bring ID as well as debit card. Updated RN and MD of discharge plan for today and need for taxi transport. Will outreach to ACTT member to arrange for medications to be delivered.  Spoke with ACTT and confirmed medication would be picked up and delivered along with food this afternoon. New prescriptions sent by MD to [place].

## 2019-07-20 NOTE — ED Notes (Signed)
Given  breakfast

## 2019-07-20 NOTE — ED Notes (Signed)
[place] ACT team

## 2019-07-20 NOTE — ED Notes (Signed)
Pt ate 100% of lunch tray

## 2019-07-20 NOTE — ED Notes (Signed)
Social work at bedside.  

## 2019-07-20 NOTE — ED Notes (Signed)
Pt ate all of breakfast.  

## 2019-07-20 NOTE — Care Management (Signed)
ED RN CM talked with patient regarding options for discharge. Patient states she is concerned that she cancelled her bank account.   RN CM outreached to previous group home and spoke with employee who states patient has [employer] account and her check was electronically deposited every month. Patient would ride with group director monthly to the bank and withdraw her rent.  RN CM called and confirmed with patient assistance that bank account, [employer], is active and receiving monthly deposits. Reviewed discharge options of [facility] with weekly rates of $299.53 but no kitchen and [facility] which are $1536.50 monthly however include kitchen. Patient does not have funding to afford [facility] and is planning to discharge to [place]. RN discussed possibility of need for small cooler using ice from motel. Patient states she will have enough month after rent to pay for food.   RN CM collaborated with [employer], [facility] CM updating of discharge plan and need for follow up in the community.  RN CM will outreach to motel and update that patient will be arriving via taxi and needing to rent weekly. Patient with no further questions or concerns at this time.   RN CM updated ACT Teams worker [name] of discharge plan.

## 2019-07-20 NOTE — ED Notes (Signed)
Confirmed with [name] that she is discharged and is awaiting taxi and will be on the way to the hotel. Sent with all of belongings and clean clothes.

## 2019-07-20 NOTE — ED Notes (Signed)
Pt given lunch

## 2019-07-23 ENCOUNTER — Telehealth: Payer: Self-pay

## 2019-07-23 NOTE — Progress Notes (Deleted)
[place]  [phone] [phone]  ID: [id number] Lapid OB: [birth year]  MR#: [id number]  GDJ#:[id number]  Patient Care Team: Steele Sizer, MD as PCP - General (Family Medicine) Pleasant, Eppie Gibson, RN as [place]. (Psychiatry) Deetta Perla, MD as Consulting Physician (Neurosurgery) Anabel Bene, MD as Referring Physician (Neurology) Noreene Filbert, MD as Referring Physician (Radiation Oncology) Cathi Roan, [facility] (Pharmacist) Vern Claude, LCSW as Early Management  I connected with [name] on 07/23/19 at  2:15 PM EST by {Blank single:19197::"video enabled telemedicine visit","telephone visit"} and verified that I am speaking with the correct person using two identifiers.   I discussed the limitations, risks, security and privacy concerns of performing an evaluation and management service by telemedicine and the availability of in-person appointments. I also discussed with the patient that there may be a patient responsible charge related to this service. The patient expressed understanding and agreed to proceed.   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  She has chronic shortness of breath and appears to be gasping for air, but her oxygen saturations are currently 100%.  She also has an essential tremor of unknown etiology.  She has no other neurologic complaints.  She has chronic weakness and fatigue.  She denies any pain.  She has a fair appetite, but denies weight loss.  She has no chest pain, cough, or hemoptysis.  She denies  any nausea, vomiting, constipation, or diarrhea.  She has no urinary complaints.  Patient offers no further specific complaints today.  REVIEW OF SYSTEMS:   Review of Systems  Constitutional: Positive for malaise/fatigue. Negative for fever.  Respiratory: Positive for shortness of breath. Negative for cough and hemoptysis.   Cardiovascular: Negative.  Negative for chest pain.  Gastrointestinal: Negative.  Negative for abdominal pain, blood in stool and melena.  Genitourinary: Negative.  Negative for dysuria.  Musculoskeletal: Negative.  Negative for back pain.  Skin: Negative.  Negative for rash.  Neurological: Positive for weakness. Negative for dizziness, focal weakness and headaches.  Psychiatric/Behavioral: Negative.  The patient is not nervous/anxious.     As per HPI. Otherwise, a complete review of systems is negative.  PAST MEDICAL HISTORY: Past Medical History:  Diagnosis Date   Anxiety    Apnea, sleep 02/02/2014   Awareness of heartbeats 02/02/2014   Breathlessness on exertion 02/02/2014   Diabetes mellitus    Encounter for pre-employment examination 06/29/2015   Excessive sweating 07/05/2015   Fibromyalgia    GERD (gastroesophageal reflux disease)    Gravida 1 10/26/2015   1.     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 last seizure 2013!   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   Stiffness of both  knees 06/22/2015   Stroke [facility])     PAST SURGICAL HISTORY: Past Surgical History:  Procedure Laterality Date   BRAIN TUMOR EXCISION  2012   benign   CARDIAC CATHETERIZATION  2008   CESAREAN SECTION     CYST REMOVAL HAND      LUNG LOBECTOMY  1977   benign tumor   TOTAL KNEE ARTHROPLASTY Left 01/28/2017   Procedure: TOTAL KNEE ARTHROPLASTY;  Surgeon: Corky Mull, MD;  Location: [facility] ORS;  Service: Orthopedics;  Laterality: Left;    FAMILY HISTORY: Family History  Problem Relation Age of Onset   Heart disease Brother    Depression Mother    Heart attack Mother    Stroke Mother    Alcohol abuse Father    Stroke Father    Diabetes Sister    Diabetes Sister    Stomach cancer Sister    Kidney disease Sister    COPD Brother    Lung cancer Brother    Diabetes Brother     ADVANCED DIRECTIVES (Y/N):  N  HEALTH MAINTENANCE: Social History   Tobacco Use   Smoking status: Never Smoker   Smokeless tobacco: Never Used  Substance Use Topics   Alcohol use: No    Alcohol/week: 0.0 standard drinks   Drug use: No     Colonoscopy:  PAP:  Bone density:  Lipid panel:  Allergies  Allergen Reactions   Percocet [Oxycodone-Acetaminophen] Diarrhea, Nausea And Vomiting and Nausea Only   Tramadol Hcl Diarrhea, Nausea And Vomiting and Nausea Only   Vicodin [Hydrocodone-Acetaminophen] Diarrhea, Nausea And Vomiting and Nausea Only    Current Outpatient Medications  Medication Sig Dispense Refill   Artificial Saliva (BIOTENE DRY MOUTH MOISTURIZING) SOLN Use as directed 1 application in the mouth or throat 2 (two) times daily. 44.3 mL 5   Ascorbic Acid 500 MG CHEW Chew 1 tablet (500 mg total) by mouth as directed. 60 tablet 5   aspirin EC 81 MG tablet Take 1 tablet (81 mg total) by mouth daily before breakfast. 30 tablet 5   atorvastatin (LIPITOR) 40 MG tablet Take 1 tablet (40 mg total) by mouth daily at 6 PM. 30 tablet 2   benztropine (COGENTIN) 1 MG tablet Take 1-2 tablets (1-2 mg total) by mouth 2 (two) times daily. One tablet (4m) in the morning and two tablets (253m at bedtime 120 tablet 2   blood glucose meter kit and supplies KIT Dispense based on patient and insurance  preference. Use up to four times daily as directed. (FOR ICD-9 250.00, 250.01). 1 each 0   Dentifrices (BIOTENE DRY MOUTH GENTLE) PSTE Place 1 application onto teeth 2 (two) times daily. 121.9 g 5   Elastic Bandages & Supports (KNEE COMPRESSION SLEEVE/L/XL) MISC 2 each by Does not apply route as needed. Wear during day; take off at night 2 each 0   escitalopram (LEXAPRO) 10 MG tablet Take 1 tablet (10 mg total) by mouth daily. 90 tablet 1   gabapentin (NEURONTIN) 100 MG capsule Take 1 capsule (100 mg total) by mouth 2 (two) times daily. 180 capsule 1   haloperidol (HALDOL) 5 MG tablet Take 5 mg by mouth daily.      haloperidol decanoate (HALDOL DECANOATE) 100 MG/ML injection      hydrochlorothiazide (HYDRODIURIL) 12.5 MG tablet Take 1 tablet (12.5 mg total) by mouth daily. New dose, stop hctz 25 mg 90 tablet 0   ibuprofen (ADVIL,MOTRIN) 200 MG tablet Take 600 mg by mouth every 8 (eight) hours as  needed (for knee pain.).     levETIRAcetam (KEPPRA) 500 MG tablet Take 1 tablet (500 mg total) by mouth 2 (two) times daily. 60 tablet 2   lisinopril (ZESTRIL) 20 MG tablet Take 1 tablet (20 mg total) by mouth daily. 30 tablet 5   Magnesium 400 MG CAPS Take 1 capsule by mouth 2 (two) times daily. 60 capsule 2   metFORMIN (GLUCOPHAGE) 500 MG tablet Take 1 tablet (500 mg total) by mouth 2 (two) times daily with a meal. 30 tablet 2   omeprazole (PRILOSEC) 20 MG capsule Take 1 capsule (20 mg total) by mouth 2 (two) times daily. 180 capsule 0   pilocarpine (SALAGEN) 5 MG tablet Take 1 tablet (5 mg total) by mouth 3 (three) times daily. 90 tablet 2   potassium chloride (KLOR-CON) 10 MEQ tablet Take 1 tablet (10 mEq total) by mouth 2 (two) times daily. 60 tablet 0   traZODone (DESYREL) 50 MG tablet Take 1 tablet (50 mg total) by mouth at bedtime. 30 tablet 3   No current facility-administered medications for this visit.     OBJECTIVE: There were no vitals filed for this visit.   There is no  height or weight on file to calculate BMI.    ECOG FS:2 - Symptomatic, <50% confined to bed  General: Appears uncomfortable, mild respiratory distress.  Sitting in a wheelchair.  Eyes: Pink conjunctiva, anicteric sclera. HEENT: Normocephalic, moist mucous membranes, clear oropharnyx. Lungs: Clear to auscultation bilaterally. Heart: Regular rate and rhythm. No rubs, murmurs, or gallops. Abdomen: Soft, nontender, nondistended. No organomegaly noted, normoactive bowel sounds. Musculoskeletal: No edema, cyanosis, or clubbing. Neuro: Alert, answering all questions appropriately. Cranial nerves grossly intact. Skin: No rashes or petechiae noted. Psych: Normal affect.  LAB RESULTS:  Lab Results  Component Value Date   NA 139 07/18/2019   K 3.7 07/18/2019   CL 105 07/18/2019   CO2 25 07/18/2019   GLUCOSE 99 07/18/2019   BUN 14 07/18/2019   CREATININE 0.78 07/18/2019   CALCIUM 9.2 07/18/2019   PROT 6.8 06/09/2019   ALBUMIN 3.8 04/07/2019   AST 15 06/09/2019   ALT 10 06/09/2019   ALKPHOS 66 04/07/2019   BILITOT 0.6 06/09/2019   GFRNONAA >60 07/18/2019   GFRAA >60 07/18/2019    Lab Results  Component Value Date   WBC 7.7 06/24/2019   NEUTROABS 4.0 06/24/2019   HGB 10.8 (L) 06/24/2019   HCT 31.4 (L) 06/24/2019   MCV 93.5 06/24/2019   PLT 283 06/24/2019     STUDIES: Dg Chest Port 1 View  Result Date: 06/28/2019 CLINICAL DATA:  Tachypnea with ambulation. EXAM: PORTABLE CHEST 1 VIEW COMPARISON:  04/06/2019 FINDINGS: Mild enlargement of the cardiopericardial silhouette, without appreciable edema. No change in clips along the left hilum. Thoracic spondylosis. No blunting of the costophrenic angles. Chronic left fourth rib deformities with bony bridging between portions of the left fourth and fifth ribs. IMPRESSION: 1. Mild enlargement of the cardiopericardial silhouette, without edema. 2. Chronic left fourth rib deformities. 3. Thoracic spondylosis. 4. No acute findings.  Electronically Signed   By: Van Clines M.D.   On: 06/28/2019 13:58    ASSESSMENT: IgA gammopathy  PLAN:   1. IgA gammopathy: Patient noted to have a mildly elevated IgA level on blood work.  She did not have an M spike.  Repeat laboratory work from today is pending.  Will also draw kappa/lambda light chains for completeness.  Patient has a mild anemia, but no other evidence of  endorgan damage.  She does not require bone marrow biopsy or metastatic bone survey, but would consider one in the future if necessary.  No intervention is needed at this time.  Return to clinic in 3 months with repeat laboratory work and further evaluation. 2.  Anemia: Mild, chronic and unchanged.  Monitor. 3.  Hypokalemia: Patient currently taking potassium supplementation.  Will forward results to primary care physician for further evaluation and treatment. 4.  Shortness of breath: Patient's oxygen saturations are 100% in clinic.  Monitor.  I provided *** minutes of {Blank single:19197::"face-to-face video visit time","non face-to-face telephone visit time"} during this encounter, and > 50% was spent counseling as documented under my assessment & plan.   Patient expressed understanding and was in agreement with this plan. She also understands that She can call clinic at any time with any questions, concerns, or complaints.     Lloyd Huger, MD   07/23/2019 5:31 PM

## 2019-07-27 ENCOUNTER — Encounter: Payer: Self-pay | Admitting: Oncology

## 2019-07-27 ENCOUNTER — Inpatient Hospital Stay: Payer: Medicare Other | Admitting: Oncology

## 2019-07-27 ENCOUNTER — Other Ambulatory Visit: Payer: Self-pay | Admitting: Oncology

## 2019-07-28 ENCOUNTER — Telehealth: Payer: Self-pay

## 2019-08-30 ENCOUNTER — Ambulatory Visit: Payer: Self-pay | Admitting: *Deleted

## 2019-08-30 NOTE — Chronic Care Management (AMB) (Signed)
Chronic Care Management   Social Work Note  08/30/2019 Name: Lauren [name] MRN: [id number] DOB: Jun 23, 1951  Lauren [name] is a 68 y.o. year old female who sees Lauren Sizer, MD for primary care. The CCM team was consulted for assistance with Lauren [name] .   Follow up phone call to patient following her hospital stay and to assess for community resource needs. Per inpatient social worker, patient discharged to the [facility], however no room number was given. This social worker contacted the [facility], however the room number is needed before a message can be relayed. Phone call to patient's sister who confirmed that patient was still there in room 201, however her phone has been disconnected. Her cell phone number is [phone]. Patient's sister left a message for a return call and will request that she call this social worker as well.   Elliot Gurney, LCSW Clinical Social Worker  [facility] Care Management [phone]    Outpatient Encounter Medications as of 08/30/2019  Medication Sig Note  . Artificial Saliva (BIOTENE DRY MOUTH MOISTURIZING) SOLN Use as directed 1 application in the mouth or throat 2 (two) times daily.   . Ascorbic Acid 500 MG CHEW Chew 1 tablet (500 mg total) by mouth as directed.   [name] aspirin EC 81 MG tablet Take 1 tablet (81 mg total) by mouth daily before breakfast.   . atorvastatin (LIPITOR) 40 MG tablet Take 1 tablet (40 mg total) by mouth daily at 6 PM.   . benztropine (COGENTIN) 1 MG tablet Take 1-2 tablets (1-2 mg total) by mouth 2 (two) times daily. One tablet (74m) in the morning and two tablets (239m at bedtime   . blood glucose meter kit and supplies KIT Dispense based on patient and insurance preference. Use up to four times daily as directed. (FOR ICD-9 250.00, 250.01).   . Dentifrices (BIOTENE DRY MOUTH GENTLE) PSTE Place 1 application onto teeth 2 (two) times daily.   . Water engineerandages & Supports  (KNEE COMPRESSION SLEEVE/L/XL) MISC 2 each by Does not apply route as needed. Wear during day; take off at night   . escitalopram (LEXAPRO) 10 MG tablet Take 1 tablet (10 mg total) by mouth daily.   . [name] Kitchenabapentin (NEURONTIN) 100 MG capsule Take 1 capsule (100 mg total) by mouth 2 (two) times daily.   . haloperidol (HALDOL) 5 MG tablet Take 5 mg by mouth daily.    . haloperidol decanoate (HALDOL DECANOATE) 100 MG/ML injection  06/25/2019: 05/25/19  . hydrochlorothiazide (HYDRODIURIL) 12.5 MG tablet Take 1 tablet (12.5 mg total) by mouth daily. New dose, stop hctz 25 mg   . ibuprofen (ADVIL,MOTRIN) 200 MG tablet Take 600 mg by mouth every 8 (eight) hours as needed (for knee pain.).   . [name] KitchenevETIRAcetam (KEPPRA) 500 MG tablet Take 1 tablet (500 mg total) by mouth 2 (two) times daily.   . [name] Kitchenisinopril (ZESTRIL) 20 MG tablet Take 1 tablet (20 mg total) by mouth daily.   . Magnesium 400 MG CAPS Take 1 capsule by mouth 2 (two) times daily.   . metFORMIN (GLUCOPHAGE) 500 MG tablet Take 1 tablet (500 mg total) by mouth 2 (two) times daily with a meal.   . omeprazole (PRILOSEC) 20 MG capsule Take 1 capsule (20 mg total) by mouth 2 (two) times daily.   . pilocarpine (SALAGEN) 5 MG tablet Take 1 tablet (5 mg total) by mouth 3 (three) times daily.   . potassium chloride (KLOR-CON) 10 MEQ tablet Take 1  tablet (10 mEq total) by mouth 2 (two) times daily.   . traZODone (DESYREL) 50 MG tablet Take 1 tablet (50 mg total) by mouth at bedtime.    No facility-administered encounter medications on file as of 08/30/2019.    Goals Addressed   None     Follow Up Plan: SW will follow up with patient by phone over the next 2 weeks to continue to establish contact and to assess for social work needs  Elliot Gurney, [facility] Care Management [phone]

## 2019-09-06 NOTE — Telephone Encounter (Signed)
Case closed and no further involvement.

## 2019-09-06 NOTE — Telephone Encounter (Signed)
Case closed with no further involvement

## 2019-09-07 ENCOUNTER — Telehealth: Payer: Self-pay | Admitting: *Deleted

## 2019-09-08 ENCOUNTER — Ambulatory Visit: Payer: Self-pay | Admitting: *Deleted

## 2019-09-08 DIAGNOSIS — E669 Obesity, unspecified: Secondary | ICD-10-CM

## 2019-09-08 DIAGNOSIS — F200 Paranoid schizophrenia: Secondary | ICD-10-CM

## 2019-09-08 DIAGNOSIS — E1169 Type 2 diabetes mellitus with other specified complication: Secondary | ICD-10-CM

## 2019-09-09 NOTE — Chronic Care Management (AMB) (Signed)
Chronic Care Management    Clinical Social Work Follow Up Note  09/09/2019 Name: Lauren [name] MRN: [id number] DOB: [birth year]  Lauren [name] is a 68 y.o. year old female who is a primary care patient of Steele Sizer, MD. The CCM team was consulted for assistance with Financial Difficulties related to housing.   Review of patient status, including review of consultants reports, other relevant assessments, and collaboration with appropriate care team members and the patient's provider was performed as part of comprehensive patient evaluation and provision of chronic care management services.      Advanced Directives Status: <no information> See Care Plan for related entries.   Outpatient Encounter Medications as of 09/08/2019  Medication Sig Note  . Artificial Saliva (BIOTENE DRY MOUTH MOISTURIZING) SOLN Use as directed 1 application in the mouth or throat 2 (two) times daily.   . Ascorbic Acid 500 MG CHEW Chew 1 tablet (500 mg total) by mouth as directed.   [name] aspirin EC 81 MG tablet Take 1 tablet (81 mg total) by mouth daily before breakfast.   . atorvastatin (LIPITOR) 40 MG tablet Take 1 tablet (40 mg total) by mouth daily at 6 PM.   . benztropine (COGENTIN) 1 MG tablet Take 1-2 tablets (1-2 mg total) by mouth 2 (two) times daily. One tablet (8m) in the morning and two tablets (260m at bedtime   . blood glucose meter kit and supplies KIT Dispense based on patient and insurance preference. Use up to four times daily as directed. (FOR ICD-9 250.00, 250.01).   . Dentifrices (BIOTENE DRY MOUTH GENTLE) PSTE Place 1 application onto teeth 2 (two) times daily.   . Water engineerandages & Supports (KNEE COMPRESSION SLEEVE/L/XL) MISC 2 each by Does not apply route as needed. Wear during day; take off at night   . escitalopram (LEXAPRO) 10 MG tablet Take 1 tablet (10 mg total) by mouth daily.   . [name] Kitchenabapentin (NEURONTIN) 100 MG capsule Take 1 capsule (100 mg total) by mouth 2 (two)  times daily.   . haloperidol (HALDOL) 5 MG tablet Take 5 mg by mouth daily.    . haloperidol decanoate (HALDOL DECANOATE) 100 MG/ML injection  06/25/2019: 05/25/19  . hydrochlorothiazide (HYDRODIURIL) 12.5 MG tablet Take 1 tablet (12.5 mg total) by mouth daily. New dose, stop hctz 25 mg   . ibuprofen (ADVIL,MOTRIN) 200 MG tablet Take 600 mg by mouth every 8 (eight) hours as needed (for knee pain.).   . [name] KitchenevETIRAcetam (KEPPRA) 500 MG tablet Take 1 tablet (500 mg total) by mouth 2 (two) times daily.   . [name] Kitchenisinopril (ZESTRIL) 20 MG tablet Take 1 tablet (20 mg total) by mouth daily.   . Magnesium 400 MG CAPS Take 1 capsule by mouth 2 (two) times daily.   . metFORMIN (GLUCOPHAGE) 500 MG tablet Take 1 tablet (500 mg total) by mouth 2 (two) times daily with a meal.   . omeprazole (PRILOSEC) 20 MG capsule Take 1 capsule (20 mg total) by mouth 2 (two) times daily.   . pilocarpine (SALAGEN) 5 MG tablet Take 1 tablet (5 mg total) by mouth 3 (three) times daily.   . potassium chloride (KLOR-CON) 10 MEQ tablet Take 1 tablet (10 mEq total) by mouth 2 (two) times daily.   . traZODone (DESYREL) 50 MG tablet Take 1 tablet (50 mg total) by mouth at bedtime.    No facility-administered encounter medications on file as of 09/08/2019.     Goals Addressed  This Visit's Progress   . "I need to find a new place to live"       Current Barriers:  . Housing barriers  Clinical Social Work Clinical Goal(s):  [name] Over the next 90 days, patient will work with her case Freight forwarder through [employer] and the ACT program  to address needs related to housing   Interventions: . Patient interviewed and appropriate assessments performed . Confirmed that patient is residing temporarily at the [facility] however would like to move into affordable housing . Confirmed that patient is paid up through 09/12/19 and is currently working with her case manager with [name] to find affordable housing . Confirmed that patient  is also working with the ACT team to identify housing with plan to reside with sister until affordable housing is located . Advised patient to follow up with [name] and the ACT Team regarding housing needs.  Patient Self Care Activities:  . Patient verbalizes understanding of plan to work with her [place] worker and ACT team to  identify affordable housing  . Unable to independently afford housing  . Unable to perform IADLs independently  Initial goal documentation\         Follow Up Plan: SW will follow up with patient by phone over the next 2 weeks to follow up on her housing needs   Elliot Gurney, [place] Worker  [facility] Care Management [phone]

## 2019-09-09 NOTE — Patient Instructions (Signed)
Thank you allowing the Chronic Care Management Team to be a part of your care! It was a pleasure speaking with you today!  1. Please follow up with your social worker from [place] and the ACT team for housing options  CCM (Chronic Care Management) Team   Neldon Labella RN, BSN Nurse Care Coordinator  [phone]  Ruben Reason PharmD  Clinical Pharmacist  [phone]   Elliot Gurney, LCSW Clinical Social Worker [phone]  Goals Addressed            This Visit's Progress   . "I need to find a new place to live"       Current Barriers:  . Housing barriers  Clinical Social Work Clinical Goal(s):  [name] Over the next 90 days, patient will work with her case Freight forwarder through [employer] and the ACT program  to address needs related to housing   Interventions: . Patient interviewed and appropriate assessments performed . Confirmed that patient is residing temporarily at the [facility] however would like to move into affordable housing . Confirmed that patient is paid up through 09/12/19 and is currently working with her case manager with [name] to find affordable housing . Confirmed that patient is also working with the ACT team to identify housing with plan to reside with sister until affordable housing is located . Advised patient to follow up with [name] and the ACT Team regarding housing needs.  Patient Self Care Activities:  . Patient verbalizes understanding of plan to work with her [place] worker and ACT team to  identify affordable housing  . Unable to independently afford housing  . Unable to perform IADLs independently  Initial goal documentation\         The patient verbalized understanding of instructions provided today and declined a print copy of patient instruction materials.   The care management team will reach out to the patient again over the next 7-10 business days.

## 2019-09-16 ENCOUNTER — Emergency Department: Payer: Medicare Other

## 2019-09-16 ENCOUNTER — Emergency Department
Admission: EM | Admit: 2019-09-16 | Discharge: 2019-09-18 | Disposition: A | Discharge status: Home / Self Care | Payer: Medicare Other | Attending: Emergency Medicine | Admitting: Emergency Medicine

## 2019-09-16 ENCOUNTER — Other Ambulatory Visit: Payer: Self-pay

## 2019-09-16 ENCOUNTER — Encounter: Payer: Self-pay | Admitting: Intensive Care

## 2019-09-16 ENCOUNTER — Telehealth: Payer: Self-pay | Admitting: *Deleted

## 2019-09-16 DIAGNOSIS — I2510 Atherosclerotic heart disease of native coronary artery without angina pectoris: Secondary | ICD-10-CM | POA: Insufficient documentation

## 2019-09-16 DIAGNOSIS — R0602 Shortness of breath: Secondary | ICD-10-CM | POA: Diagnosis not present

## 2019-09-16 DIAGNOSIS — F419 Anxiety disorder, unspecified: Secondary | ICD-10-CM | POA: Diagnosis not present

## 2019-09-16 DIAGNOSIS — Z743 Need for continuous supervision: Secondary | ICD-10-CM | POA: Diagnosis not present

## 2019-09-16 DIAGNOSIS — Z79899 Other long term (current) drug therapy: Secondary | ICD-10-CM | POA: Insufficient documentation

## 2019-09-16 DIAGNOSIS — Z7984 Long term (current) use of oral hypoglycemic drugs: Secondary | ICD-10-CM | POA: Insufficient documentation

## 2019-09-16 DIAGNOSIS — Z7982 Long term (current) use of aspirin: Secondary | ICD-10-CM | POA: Insufficient documentation

## 2019-09-16 DIAGNOSIS — E119 Type 2 diabetes mellitus without complications: Secondary | ICD-10-CM | POA: Diagnosis not present

## 2019-09-16 DIAGNOSIS — I10 Essential (primary) hypertension: Secondary | ICD-10-CM | POA: Diagnosis not present

## 2019-09-16 DIAGNOSIS — R069 Unspecified abnormalities of breathing: Secondary | ICD-10-CM | POA: Diagnosis not present

## 2019-09-16 LAB — TROPONIN I (HIGH SENSITIVITY)
Troponin I (High Sensitivity): <2 ng/L (ref <18)
Troponin I (High Sensitivity): <2 ng/L (ref <18)

## 2019-09-16 LAB — BASIC METABOLIC PANEL
Anion gap: 9 (ref 5–15)
BUN: 15 mg/dL (ref 8–23)
CO2: 25 mmol/L (ref 22–32)
Calcium: 9.5 mg/dL (ref 8.9–10.3)
Chloride: 107 mmol/L (ref 98–111)
Creatinine, Ser: 0.8 mg/dL (ref 0.44–1.00)
GFR calc Af Amer: >60 mL/min (ref >60)
GFR calc non Af Amer: >60 mL/min (ref >60)
Glucose, Bld: 101 mg/dL — ABNORMAL HIGH (ref 70–99)
Potassium: 3.4 mmol/L — ABNORMAL LOW (ref 3.5–5.1)
Sodium: 141 mmol/L (ref 135–145)

## 2019-09-16 LAB — CBC
HCT: 34.1 % — ABNORMAL LOW (ref 36.0–46.0)
Hemoglobin: 11.3 g/dL — ABNORMAL LOW (ref 12.0–15.0)
MCH: 31 pg (ref 26.0–34.0)
MCHC: 33.1 g/dL (ref 30.0–36.0)
MCV: 93.7 fL (ref 80.0–100.0)
Platelets: 266 10*3/uL (ref 150–400)
RBC: 3.64 MIL/uL — ABNORMAL LOW (ref 3.87–5.11)
RDW: 14.9 % (ref 11.5–15.5)
WBC: 6.3 10*3/uL (ref 4.0–10.5)
nRBC: 0 % (ref 0.0–0.2)

## 2019-09-16 LAB — ETHANOL: Alcohol, Ethyl (B): <10 mg/dL (ref <10)

## 2019-09-16 MED ORDER — HYDROCHLOROTHIAZIDE 25 MG PO TABS
12.5000 mg | ORAL_TABLET | Freq: Every day | ORAL | Status: DC
  Administered 2019-09-17 – 2019-09-18 (×2): 12.5 mg via ORAL
  Filled 2019-09-16 (×2): qty 1

## 2019-09-16 MED ORDER — LISINOPRIL 10 MG PO TABS
20.0000 mg | ORAL_TABLET | Freq: Every day | ORAL | Status: DC
  Administered 2019-09-17 – 2019-09-18 (×2): 20 mg via ORAL
  Filled 2019-09-16 (×2): qty 2

## 2019-09-16 MED ORDER — BENZTROPINE MESYLATE 1 MG PO TABS
1.0000 mg | ORAL_TABLET | Freq: Two times a day (BID) | ORAL | Status: DC
  Administered 2019-09-17 – 2019-09-18 (×4): 1 mg via ORAL
  Filled 2019-09-16 (×4): qty 1

## 2019-09-16 MED ORDER — MAGNESIUM OXIDE 400 (241.3 MG) MG PO TABS
400.0000 mg | ORAL_TABLET | Freq: Two times a day (BID) | ORAL | Status: DC
  Administered 2019-09-17 – 2019-09-18 (×4): 400 mg via ORAL
  Filled 2019-09-16 (×4): qty 1

## 2019-09-16 MED ORDER — GABAPENTIN 100 MG PO CAPS
100.0000 mg | ORAL_CAPSULE | Freq: Two times a day (BID) | ORAL | Status: DC
  Administered 2019-09-17 – 2019-09-18 (×4): 100 mg via ORAL
  Filled 2019-09-16 (×7): qty 1

## 2019-09-16 MED ORDER — METFORMIN HCL 500 MG PO TABS
500.0000 mg | ORAL_TABLET | Freq: Two times a day (BID) | ORAL | Status: DC
  Administered 2019-09-17 – 2019-09-18 (×3): 500 mg via ORAL
  Filled 2019-09-16 (×4): qty 1

## 2019-09-16 MED ORDER — ESCITALOPRAM OXALATE 10 MG PO TABS
10.0000 mg | ORAL_TABLET | Freq: Every day | ORAL | Status: DC
  Administered 2019-09-17 – 2019-09-18 (×2): 10 mg via ORAL
  Filled 2019-09-16 (×2): qty 1

## 2019-09-16 MED ORDER — PILOCARPINE HCL 5 MG PO TABS
5.0000 mg | ORAL_TABLET | Freq: Three times a day (TID) | ORAL | Status: DC
  Administered 2019-09-17 – 2019-09-18 (×5): 5 mg via ORAL
  Filled 2019-09-16 (×7): qty 1

## 2019-09-16 MED ORDER — HALOPERIDOL 5 MG PO TABS
5.0000 mg | ORAL_TABLET | Freq: Every day | ORAL | Status: DC
  Administered 2019-09-17 – 2019-09-18 (×2): 5 mg via ORAL
  Filled 2019-09-16 (×2): qty 1

## 2019-09-16 MED ORDER — HYDROCHLOROTHIAZIDE 25 MG PO TABS
25.0000 mg | ORAL_TABLET | Freq: Every day | ORAL | Status: DC
  Administered 2019-09-16 – 2019-09-18 (×3): 25 mg via ORAL
  Filled 2019-09-16 (×3): qty 1

## 2019-09-16 MED ORDER — TRAZODONE HCL 50 MG PO TABS
50.0000 mg | ORAL_TABLET | Freq: Every day | ORAL | Status: DC
  Administered 2019-09-17 – 2019-09-18 (×2): 50 mg via ORAL
  Filled 2019-09-16 (×2): qty 1

## 2019-09-16 MED ORDER — ASPIRIN EC 81 MG PO TBEC
81.0000 mg | DELAYED_RELEASE_TABLET | Freq: Every day | ORAL | Status: DC
  Administered 2019-09-17 – 2019-09-18 (×2): 81 mg via ORAL
  Filled 2019-09-16 (×2): qty 1

## 2019-09-16 MED ORDER — LISINOPRIL 10 MG PO TABS
20.0000 mg | ORAL_TABLET | Freq: Once | ORAL | Status: AC
  Administered 2019-09-16: 20 mg via ORAL
  Filled 2019-09-16: qty 2

## 2019-09-16 MED ORDER — LEVETIRACETAM 500 MG PO TABS
500.0000 mg | ORAL_TABLET | Freq: Two times a day (BID) | ORAL | Status: DC
  Administered 2019-09-17 – 2019-09-18 (×4): 500 mg via ORAL
  Filled 2019-09-16 (×4): qty 1

## 2019-09-16 MED ORDER — ATORVASTATIN CALCIUM 20 MG PO TABS
40.0000 mg | ORAL_TABLET | Freq: Every day | ORAL | Status: DC
  Administered 2019-09-17: 40 mg via ORAL
  Filled 2019-09-16: qty 2

## 2019-09-16 NOTE — ED Notes (Addendum)
This RN spoke with Thressa Sheller [phone] SW from [place] [facility] who made arrangements for the [place] on . Pt able to pay her stay until 12/29 where pt ran out of money. After pt not able to afford housing, [employer] out-patient mental health found funds for pt to stay. Per SW [place], st pt has no access to her Social security check and has not money to pay for housing.  SW reported situation to DSS.

## 2019-09-16 NOTE — ED Triage Notes (Signed)
Arrived by EMS with c/o SOB from motel with friend where she has been staying. EMS b/p 203/123 but has not taken medication. A&O x4. Stutter is normal/baseline. EMS reports anxiety. [phone] Kennyth Lose (sister)

## 2019-09-16 NOTE — ED Notes (Addendum)
This RN spoke with sister Humberto Leep (sister) DPR [phone] who st pt cannot stay with her. Pt needs a facility to take care of her.

## 2019-09-16 NOTE — ED Notes (Signed)
Pt st shob "when I am anxious". Pt does not report CP/SOB at this time. Pt st "I was forced to live in a hotel with a coworker and I cannot afford it, I do not want to go back  There". NAD noted at this time.

## 2019-09-16 NOTE — ED Provider Notes (Addendum)
[facility] Emergency Department Provider Note  ____________________________________________  Time seen: Approximately 6:43 PM  I have reviewed the triage vital signs and the nursing notes.   HISTORY  Chief Complaint Shortness of Breath    HPI Lauren [name] is a 69 y.o. female with a history of anxiety diabetes GERD hypertension hyperlipidemia schizoaffective disorder who comes the ED from the motel where she has been staying with a friend complaining of shortness of breath.  Patient states that she feels like it is her anxiety.  She is worried about finances.  Social work is involved and working with the patient's act team, [employer], and DSS to provide for the patient's housing needs.  Patient denies any chest pain.  States that she has been eating and drinking normally.  Denies any other complaints or trauma or fever.  No cough.      Past Medical History:  Diagnosis Date  . Anxiety   . Apnea, sleep 02/02/2014  . Awareness of heartbeats 02/02/2014  . Breathlessness on exertion 02/02/2014  . Diabetes mellitus   . Encounter for pre-employment examination 06/29/2015  . Excessive sweating 07/05/2015  . Fibromyalgia   . GERD (gastroesophageal reflux disease)   . Gravida 1 10/26/2015   1.    . Heart murmur   . Herniated disc   . Hyperlipidemia   . Hypertension   . Itch of skin 10/26/2015  . Lack of bladder control   . Lung tumor   . Rheumatoid arthritis ([place])   . Schizoaffective disorder ([place])   . Screening for cervical cancer 07/29/2017  . Seizure [facility])    after brain surgery 2012. last seizure 2013!  [name] Sex counseling 10/26/2015  . Sleep apnea   . 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 Stiffness of both knees 06/22/2015  . Stroke [facility])      Patient Active Problem List   Diagnosis Date Noted  . 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 SS-A antibody positive 04/15/2019  . AKI (acute kidney injury) ([place]) 04/06/2019   . Slurred speech 03/04/2019  . Chest pain in adult 02/15/2019  . Acute respiratory failure ([place]) 02/15/2019  . Seizure-like activity ([place]) 12/29/2018  . Seizures ([place]) 11/24/2018  . Paresthesias 11/22/2018  . Mouth dryness 10/12/2018  . Hx of resection of meningioma 07/21/2018  . Tremor 07/21/2018  . Schizoaffective disorder ([place]) 12/29/2017  . Morbid obesity ([place]) 12/09/2017  . Meningioma ([place]) 11/25/2017  . Atherosclerosis of aorta ([place]) 11/25/2017  . Calcification of coronary artery 11/25/2017  . Schizophrenia ([place]) 11/04/2017  . Acid reflux 06/29/2015  . Diabetes mellitus type 2, controlled, without complications ([place]) [id number]/6759  . Cardiac murmur 06/29/2015  . Arthritis of knee, degenerative 06/28/2015  . Insomnia w/ sleep apnea 03/21/2015  . Anxiety disorder 03/21/2015  . Hypertension 03/21/2015  . HLD (hyperlipidemia) 03/21/2015  . Urge incontinence 03/21/2015     Past Surgical History:  Procedure Laterality Date  . BRAIN TUMOR EXCISION  2012   benign  . CARDIAC CATHETERIZATION  2008  . CESAREAN SECTION    . CYST REMOVAL HAND    . LUNG LOBECTOMY  1977   benign tumor  . TOTAL KNEE ARTHROPLASTY Left 01/28/2017   Procedure: TOTAL KNEE ARTHROPLASTY;  Surgeon: Corky Mull, MD;  Location: [facility] ORS;  Service: Orthopedics;  Laterality: Left;     Prior to Admission medications   Medication Sig Start Date End Date Taking? Authorizing Provider  Artificial Saliva (BIOTENE DRY MOUTH MOISTURIZING) SOLN  Use as directed 1 application in the mouth or throat 2 (two) times daily. 09/28/18   Hubbard Hartshorn, FNP  Ascorbic Acid 500 MG CHEW Chew 1 tablet (500 mg total) by mouth as directed. 05/18/19   Steele Sizer, MD  aspirin EC 81 MG tablet Take 1 tablet (81 mg total) by mouth daily before breakfast. 07/20/19   Blake Divine, MD  atorvastatin (LIPITOR) 40 MG tablet Take 1 tablet (40 mg total) by mouth daily at 6 PM. 07/20/19   Blake Divine, MD  benztropine (COGENTIN) 1 MG  tablet Take 1-2 tablets (1-2 mg total) by mouth 2 (two) times daily. One tablet (4m) in the morning and two tablets (251m at bedtime 07/20/19 10/18/19  JeBlake DivineMD  blood glucose meter kit and supplies KIT Dispense based on patient and insurance preference. Use up to four times daily as directed. (FOR ICD-9 250.00, 250.01). 06/09/19   SoSteele SizerMD  Dentifrices (BIOTENE DRY MOUTH GENTLE) PSTE Place 1 application onto teeth 2 (two) times daily. 09/28/18   BoHubbard HartshornFNP  Elastic Bandages & Supports (KNEE COMPRESSION SLEEVE/L/XL) MISC 2 each by Does not apply route as needed. Wear during day; take off at night 05/12/18   Poulose, ElBethel BornNP  escitalopram (LEXAPRO) 10 MG tablet Take 1 tablet (10 mg total) by mouth daily. 07/20/19   JeBlake DivineMD  gabapentin (NEURONTIN) 100 MG capsule Take 1 capsule (100 mg total) by mouth 2 (two) times daily. 07/20/19   JeBlake DivineMD  haloperidol (HALDOL) 5 MG tablet Take 5 mg by mouth daily.  05/25/19   [provider]  haloperidol decanoate (HALDOL DECANOATE) 100 MG/ML injection  01/13/19   [provider]  hydrochlorothiazide (HYDRODIURIL) 12.5 MG tablet Take 1 tablet (12.5 mg total) by mouth daily. New dose, stop hctz 25 mg 07/20/19   JeBlake DivineMD  ibuprofen (ADVIL,MOTRIN) 200 MG tablet Take 600 mg by mouth every 8 (eight) hours as needed (for knee pain.).    [provider]  levETIRAcetam (KEPPRA) 500 MG tablet Take 1 tablet (500 mg total) by mouth 2 (two) times daily. 07/20/19 10/18/19  JeBlake DivineMD  lisinopril (ZESTRIL) 20 MG tablet Take 1 tablet (20 mg total) by mouth daily. 07/20/19   JeBlake DivineMD  Magnesium 400 MG CAPS Take 1 capsule by mouth 2 (two) times daily. 06/11/19   SoSteele SizerMD  metFORMIN (GLUCOPHAGE) 500 MG tablet Take 1 tablet (500 mg total) by mouth 2 (two) times daily with a meal. 07/20/19   JeBlake DivineMD  omeprazole (PRILOSEC) 20 MG capsule Take 1 capsule (20 mg  total) by mouth 2 (two) times daily. 05/18/19   SoSteele SizerMD  pilocarpine (SALAGEN) 5 MG tablet Take 1 tablet (5 mg total) by mouth 3 (three) times daily. 07/20/19 10/18/19  JeBlake DivineMD  potassium chloride (KLOR-CON) 10 MEQ tablet Take 1 tablet (10 mEq total) by mouth 2 (two) times daily. 07/20/19   JeBlake DivineMD  traZODone (DESYREL) 50 MG tablet Take 1 tablet (50 mg total) by mouth at bedtime. 07/20/19   JeBlake DivineMD     Allergies Percocet [oxycodone-acetaminophen], Tramadol hcl, and Vicodin [hydrocodone-acetaminophen]   Family History  Problem Relation Age of Onset  . Heart disease Brother   . Depression Mother   . Heart attack Mother   . Stroke Mother   . Alcohol abuse Father   . Stroke Father   . Diabetes Sister   . Diabetes Sister   .  Stomach cancer Sister   . Kidney disease Sister   . COPD Brother   . Lung cancer Brother   . Diabetes Brother     Social History Social History   Tobacco Use  . Smoking status: Never Smoker  . Smokeless tobacco: Never Used  Substance Use Topics  . Alcohol use: No    Alcohol/week: 0.0 standard drinks  . Drug use: No    Review of Systems  Constitutional:   No fever or chills.  ENT:   No sore throat. No rhinorrhea. Cardiovascular:   No chest pain or syncope. Respiratory:   Positive shortness of breath as above without cough. Gastrointestinal:   Negative for abdominal pain, vomiting and diarrhea.  Musculoskeletal:   Negative for focal pain or swelling All other systems reviewed and are negative except as documented above in ROS and HPI.  ____________________________________________   PHYSICAL EXAM:  VITAL SIGNS: ED Triage Vitals  Enc Vitals Group     BP 09/16/19 1516 (!) 143/77     Pulse Rate 09/16/19 1516 79     Resp 09/16/19 1516 20     Temp 09/16/19 1516 98.3 F (36.8 C)     Temp Source 09/16/19 1516 Oral     SpO2 09/16/19 1516 99 %     Weight 09/16/19 1523 224 lb (101.6 kg)     Height  09/16/19 1523 _0  (1.626 m)     Head Circumference --      Peak Flow --      Pain Score 09/16/19 1523 0     Pain Loc --      Pain Edu? --      Excl. in [place]? --     Vital signs reviewed, nursing assessments reviewed.   Constitutional:   Alert and oriented. Non-toxic appearance. Eyes:   Conjunctivae are normal. EOMI. PERRL. ENT      Head:   Normocephalic and atraumatic.      Nose:   Wearing a mask.      Mouth/Throat:   Wearing a mask.      Neck:   No meningismus. Full ROM. Hematological/Lymphatic/Immunilogical:   No cervical lymphadenopathy. Cardiovascular:   RRR. Symmetric bilateral radial and DP pulses.  No murmurs. Cap refill less than 2 seconds. Respiratory:   Normal respiratory effort without tachypnea/retractions. Breath sounds are clear and equal bilaterally. No wheezes/rales/rhonchi. Gastrointestinal:   Soft and nontender. Non distended. There is no CVA tenderness.  No rebound, rigidity, or guarding.  Musculoskeletal:   Normal range of motion in all extremities. No joint effusions.  No lower extremity tenderness.  No edema. Neurologic:   Normal speech and language.  Motor grossly intact. No acute focal neurologic deficits are appreciated.  Skin:    Skin is warm, dry and intact. No rash noted.  No petechiae, purpura, or bullae.  ____________________________________________    LABS (pertinent positives/negatives) (all labs ordered are listed, but only abnormal results are displayed) Labs Reviewed  BASIC METABOLIC PANEL - Abnormal; Notable for the following components:      Result Value   Potassium 3.4 (*)    Glucose, Bld 101 (*)    All other components within normal limits  CBC - Abnormal; Notable for the following components:   RBC 3.64 (*)    Hemoglobin 11.3 (*)    HCT 34.1 (*)    All other components within normal limits  ETHANOL  TROPONIN I (HIGH SENSITIVITY)  TROPONIN I (HIGH SENSITIVITY)    ____________________________________________   EKG  Interpreted by me Normal sinus rhythm rate of 71, left axis, normal intervals.  Poor R wave progression.  Normal ST segments and T waves.  Voltage criteria for LVH in the high lateral leads.  No acute ischemic changes.  ____________________________________________    [id number]  DG Chest 2 View  Result Date: 09/16/2019 CLINICAL DATA:  Short of breath EXAM: CHEST - 2 VIEW COMPARISON:  CT 03/04/2019 radiograph 06/28/2019 FINDINGS: Normal mediastinum and cardiac silhouette. Normal pulmonary vasculature. No evidence of effusion, infiltrate, or pneumothorax. No acute bony abnormality. Degenerative osteophytosis of the spine. IMPRESSION: No acute cardiopulmonary process. Electronically Signed   By: Suzy Bouchard M.D.   On: 09/16/2019 16:19    ____________________________________________   PROCEDURES Procedures  ____________________________________________    CLINICAL IMPRESSION / ASSESSMENT AND PLAN / ED COURSE  Medications ordered in the ED: Medications  hydrochlorothiazide (HYDRODIURIL) tablet 25 mg (25 mg Oral Given 09/16/19 1737)  lisinopril (ZESTRIL) tablet 20 mg (20 mg Oral Given 09/16/19 1737)    Pertinent labs & imaging results that were available during my care of the patient were reviewed by me and considered in my medical decision making (see chart for details).  Jarelyn Bambach was evaluated in Emergency Department on 09/16/2019 for the symptoms described in the history of present illness. She was evaluated in the context of the global COVID-19 pandemic, which necessitated consideration that the patient might be at risk for infection with the SARS-CoV-2 virus that causes COVID-19. Institutional protocols and algorithms that pertain to the evaluation of patients at risk for COVID-19 are in a state of rapid change based on information released by regulatory bodies including the CDC and federal and state  organizations. These policies and algorithms were followed during the patient's care in the ED.   Patient presents with shortness of breath and feeling anxious.  Vital signs unremarkable except for moderate hypertension.  Reportedly she has not taken her antihypertensive today so I will give her the home medication.  Symptoms are vague, consistent with her history of anxiety.  Exam is reassuring.  Vital signs chest x-ray and labs are all reassuring.  Given her comorbidities, I will get a second troponin after which patient can be discharged home if not significantly elevated.  Considering the patient's symptoms, medical history, and physical examination today, I have low suspicion for ACS, PE, TAD, pneumothorax, carditis, mediastinitis, pneumonia, CHF, or sepsis.    Clinical Course as of Sep 15 2000  Thu Sep 16, 2019  2001 Repeat troponin negative, stable for discharge   [PS]    Clinical Course User Index [PS] Carrie Mew, MD     ____________________________________________   FINAL CLINICAL IMPRESSION(S) / ED DIAGNOSES    Final diagnoses:  SOB (shortness of breath)  Anxiety     ED Discharge Orders    None      Portions of this note were generated with dragon dictation software. Dictation errors may occur despite best attempts at proofreading.   Carrie Mew, MD 09/16/19 Valerie Roys    Carrie Mew, MD 09/16/19 2002

## 2019-09-16 NOTE — ED Notes (Signed)
Pt able to ambulate to toilet to void and have a BM. Pt walks with cane.

## 2019-09-17 LAB — GLUCOSE, CAPILLARY: Glucose-Capillary: 94 mg/dL (ref 70–99)

## 2019-09-17 NOTE — ED Provider Notes (Signed)
-----------------------------------------   2:02 AM on 09/17/2019 -----------------------------------------   Blood pressure (!) 174/97, pulse 71, temperature 98.7 F (37.1 C), temperature source Oral, resp. rate 18, height 1.626 m (5\' 4" ), weight 101.6 kg, SpO2 99 %.  The patient is calm and cooperative at this time.  There have been no acute events since the last update.  Awaiting disposition plan from Social Work team(s).   Hinda Kehr, MD 09/17/19 [phone]

## 2019-09-17 NOTE — ED Notes (Signed)
Patient is resting comfortably. 

## 2019-09-17 NOTE — Evaluation (Signed)
Physical Therapy Evaluation Patient Details Name: Lauren [name] MRN: [id number] DOB: 01/14/1951 Today's Date: 09/17/2019   History of Present Illness  Pt is a 69 y.o. female presenting to ED 09/15/18 with SOB and anxiety.  PMH includes L TKR, h/o stutter, anxiety, DM, htn, schizoaffective disorder, fibromyalgia, RA, seizure, brain tumor excision.  Clinical Impression  Pt resting in ED stretcher upon PT arrival.  Prior to hospital admission, pt reports ambulating with [facility] and no falls in past 6 months; pt also reports recently living at hotel but she is not able to afford it anymore (transition of care team consulted already).  Currently pt is modified independent with bed mobility and transfers, and CGA with ambulating 120 feet with SPC.  Mild increased B lateral sway noted but overall pt steady with ambulation using SPC.  Generalized weakness noted.  No SOB noted with activity (O2 sats 94% or greater on room air during sessions activities).  Pt would benefit from skilled PT to address noted impairments and functional limitations (see below for any additional details) and prevent deconditioning during hospital stay.  Upon hospital discharge, no further PT needs anticipated.    Follow Up Recommendations No PT follow up    Equipment Recommendations  None recommended by PT(pt already has [facility])    Recommendations for Other Services       Precautions / Restrictions Precautions Precautions: Fall Restrictions Weight Bearing Restrictions: No      Mobility  Bed Mobility Overal bed mobility: Modified Independent             General bed mobility comments: Semi-supine to/from sit in ED stretcher without any noted difficulty  Transfers Overall transfer level: Modified independent Equipment used: 1 person hand held assist             General transfer comment: steady safe transfers noted  Ambulation/Gait Ambulation/Gait assistance: Min guard Gait Distance (Feet): 120  Feet Assistive device: Straight cane   Gait velocity: mildly decreased   General Gait Details: mild increased B lateral sway but overall steady with SPC use  Stairs            Wheelchair Mobility    Modified Rankin (Stroke Patients Only)       Balance Overall balance assessment: Needs assistance Sitting-balance support: No upper extremity supported;Feet supported Sitting balance-Leahy Scale: Normal Sitting balance - Comments: steady sitting reaching within BOS   Standing balance support: No upper extremity supported Standing balance-Leahy Scale: Good Standing balance comment: steady standing reaching within BOS                             Pertinent Vitals/Pain Pain Assessment: No/denies pain  HR WFL during session's activities    Home Living Family/patient expects to be discharged to:: Other (Comment)                 Additional Comments: Pt has been living at hotel but reports she can't afford it anymore; ramp to enter   Prior Function Level of Independence: Independent with assistive device(s)         Comments: Modified independent ambulation with SPC.  Pt reports being independent with ADL's and no falls in past 6 months.     Hand Dominance   Dominant Hand: Right    Extremity/Trunk Assessment   Upper Extremity Assessment Upper Extremity Assessment: Generalized weakness    Lower Extremity Assessment Lower Extremity Assessment: Generalized weakness    Cervical / Trunk Assessment  Cervical / Trunk Assessment: Normal  Communication   Communication: No difficulties  Cognition Arousal/Alertness: Awake/alert Behavior During Therapy: WFL for tasks assessed/performed Overall Cognitive Status: Within Functional Limits for tasks assessed                                        General Comments   Nursing cleared pt for participation in physical therapy.  Pt agreeable to PT session.    Exercises     Assessment/Plan     PT Assessment Patient needs continued PT services  PT Problem List Decreased strength       PT Treatment Interventions DME instruction;Gait training;Functional mobility training;Therapeutic activities;Therapeutic exercise;Balance training;Patient/family education    PT Goals (Current goals can be found in the Care Plan section)  Acute Rehab PT Goals Patient Stated Goal: to prevent deconditioning PT Goal Formulation: With patient Time For Goal Achievement: 10/01/19 Potential to Achieve Goals: Good    Frequency Min 2X/week   Barriers to discharge        Co-evaluation               AM-PAC PT "6 Clicks" Mobility  Outcome Measure Help needed turning from your back to your side while in a flat bed without using bedrails?: None Help needed moving from lying on your back to sitting on the side of a flat bed without using bedrails?: None Help needed moving to and from a bed to a chair (including a wheelchair)?: A Little Help needed standing up from a chair using your arms (e.g., wheelchair or bedside chair)?: A Little Help needed to walk in hospital room?: A Little Help needed climbing 3-5 steps with a railing? : A Little 6 Click Score: 20    End of Session Equipment Utilized During Treatment: Gait belt Activity Tolerance: Patient tolerated treatment well Patient left: (in stretcher with both railings up) Nurse Communication: Mobility status;Precautions PT Visit Diagnosis: Muscle weakness (generalized) (M62.81)    Time: [id number] PT Time Calculation (min) (ACUTE ONLY): 17 min   Charges:   PT Evaluation $PT Eval Low Complexity: 1 Low          Carle Dargan, PT 09/17/19, 10:18 AM

## 2019-09-17 NOTE — ED Notes (Signed)
Pt transferred to hospital bed

## 2019-09-17 NOTE — Care Management (Signed)
RN CM: Discussed discharge plan with this patient who states she has no where to go. Sister has dialysis and is not able to assist her, refusing group home placement and unable to afford continued motel stay.

## 2019-09-17 NOTE — ED Notes (Signed)
Pt ambulated to restroom to give self bath. Pt given clean gowns and panties, soap towels for cleaning.

## 2019-09-17 NOTE — ED Notes (Signed)
Pt oob to BR independent

## 2019-09-18 NOTE — ED Notes (Addendum)
Had pt sign hardcopy of discharge paperwork because a computer was not available in the hall. RN Val wheeled pt to lobby to use the phone to coordinate post discharge arrangements. Staff made aware pt was discharged to lobby area at this time. Cab voucher w/ 1st nurse to accomodate transportation once pt determines destination.

## 2019-09-18 NOTE — ED Notes (Addendum)
Spoke with patients sister [name] and she reports patient is not allowed in their home and "she only wants somewhere free to live, she will never change until she has to find her own way".

## 2019-09-18 NOTE — Social Work (Signed)
Social worker called the patient's sisters.  Marcial Pacas, sister [phone] Left message Humberto Leep, sister [phone] Left message     Berenice Bouton, MSW, LCSW  [phone] 8am-5:30pm

## 2019-09-18 NOTE — ED Notes (Signed)
Pt provided w/ meal tray.

## 2019-09-18 NOTE — ED Notes (Signed)
Pt asleep in bed.

## 2019-09-18 NOTE — ED Notes (Signed)
Social worker updated about patient status after speaking with family and pt again.  Pt remains NAD. Has cell phone at bedside. Remains unlabored. Waiting on disposition.

## 2019-09-18 NOTE — ED Notes (Signed)
This RN spoke with pt.  There is no room at homeless shelters and explained to patient that she may need to stay with ehr family so not out in the cold and pt agreeable to go to sisters house if her sister will let her in the house.  When asked pt if ok for social worker to speak with sister about staying with her pt states "that's ok".  Will get pt cab to sisters if ok with family for her to stay

## 2019-09-18 NOTE — Social Work (Signed)
TOC social worker provided taxi [employer].  Patient is calling homeless shelter's in [place].   Patient will let nurse know where she would like the taxi to take her.   Berenice Bouton, MSW, LCSW  [phone]

## 2019-09-18 NOTE — ED Notes (Signed)
This RN and Maritza CSW at bedside to talk with pt.  Informed her would need to be discharged to lobby and there is phone out there that she is able to use to try and find somewhere to go.  Will provide cab pass once pt finds a place to go.  Patient has left group home she had last year.  Financial aide was found to pay for hotel for pt for few months as well and that has run out and so patient returned to ED. Pt has no psychological or medical reasons for admit.  Per CSW [place] DSS is unable to help this patient.  Patient unable to explain where all her money has went that she received this month.  Informed Dr. Charna Archer of situation and after he speaks with patient will ok for discharge to lobby and pt to find somewhere to go via phone.  This RN informed homeless shelters will not take patient because of amount of money she received month.

## 2019-09-18 NOTE — ED Notes (Signed)
CSW at bedside at this time.  

## 2019-09-18 NOTE — TOC Transition Note (Addendum)
Transition of Care [facility]) - CM/SW Discharge Note   Patient Details  Name: Lauren [name] MRN: [id number] Date of Birth: 08/14/1951  Transition of Care [facility]) CM/SW Contact:  Berenice Bouton, LCSW Phone Number: 09/18/2019, 5:22 PM   Clinical Narrative:   69 year old patient who reported that she is homeless.  Social worker received verbal permission from the patient to call contacts on her emergency list.   -Social worker spoke to the patient's sisters who noted that they would not offer the patient shelter.  Both sisters reported that the patient refuses to pay for her stay.  Lives in group home and did not pay rent.  Left group home. Was in a hotel provided by DSS but did not comply with rent fees. Sisters reported that the patient has been given many changes.    -Social worker call [place] for [name] report.  Patient is well known to DSS. [place] social worker reviewed the case with DSS weekend supervisor and noted that the patient is competent and they will not take take the case.  -Social worker call [facility] in [place]. No bed available.   -Education officer, museum discussed with the patient that she is going to be discharged to the lobby.  She could used lobby telephone to call relatives.  A taxi voucher would be provided if she needs a cab ride.  RN has taxi Chief Technology Officer. Patient demonstrated good understanding that of discharge plan.  Discharge Placement      Discharge Plan and Services       Social Determinants of Health (SDOH) Interventions    Readmission Risk Interventions Readmission Risk Prevention Plan 02/16/2019  Transportation Screening Complete  PCP or Specialist Appt within 5-7 Days Complete  Home Care Screening Complete  Medication Review (RN CM) Complete  Some recent data might be hidden

## 2019-09-18 NOTE — ED Notes (Signed)
Meal tray ordered 

## 2019-09-18 NOTE — TOC Transition Note (Signed)
Transition of Care [facility]) - CM/SW Discharge Note   Patient Details  Name: Lauren [name] MRN: [id number] Date of Birth: 1951-05-03  Transition of Care [facility]) CM/SW Contact:  Berenice Bouton, LCSW Phone Number: 09/18/2019, 10:49 AM   Clinical Narrative:  The patient is a 69 year old woman who reported that "I have no where to stay."  Discussed with the patient possible family members that she might be able to call for lodging/housing. Patient noted that she has no family members. Social worker inquired about family members on emergency contact list: Marcial Pacas, Humberto Leep, and Timoteo Expose all sisters of this patient.  Patient noted that she did not want TOC social worker to call any of her family members.  Discussed with the patient homeless shelters in [place], [place].  Will provide patient with list of shelters and contact number.   Plan:  Homeless shelter.       Patient Goals and CMS Choice     Discharge Placement               Discharge Plan and Services       Social Determinants of Health (SDOH) Interventions   Readmission Risk Interventions Readmission Risk Prevention Plan 02/16/2019  Transportation Screening Complete  PCP or Specialist Appt within 5-7 Days Complete  Home Care Screening Complete  Medication Review (RN CM) Complete  Some recent data might be hidden

## 2019-09-18 NOTE — ED Notes (Signed)
Pt alert. NAD at this time.  Waiting for placement. Unlabored. No needs currently.

## 2019-09-18 NOTE — ED Provider Notes (Signed)
-----------------------------------------   5:17 PM on 09/18/2019 -----------------------------------------  Blood pressure 134/86, pulse 84, temperature 98.6 F (37 C), temperature source Oral, resp. rate 17, height 5\' 4"  (D34-534 m), weight 101.6 kg, SpO2 95 %.  Patient has been reevaluated by social work and unfortunately there are limited options for what we are able to provide her from the ED at this time.  Patient reportedly receives regular monthly income but it is unclear what she does with this.  She is no longer able to stay with family and did not like the group home she was staying at because "people were fussing at her".  There are no medical or psychiatric reasons for admission at this time.  She has previously been evaluated and determined to have capacity to make her own decisions.  She is also been seen here in the ED able to care for herself and perform her ADLs.  Per social work, there are no additional resources that we will be able to provide her at this time and she is appropriate for discharge.   Blake Divine, MD 09/18/19 1721

## 2019-09-18 NOTE — ED Notes (Signed)
Dr Charna Archer at bedside to talk w/ pt. Pt cleared for discharge at this time.

## 2019-09-21 ENCOUNTER — Telehealth: Payer: Self-pay | Admitting: Family Medicine

## 2019-09-21 ENCOUNTER — Other Ambulatory Visit: Payer: Self-pay

## 2019-09-21 ENCOUNTER — Encounter: Payer: Self-pay | Admitting: Family Medicine

## 2019-09-21 ENCOUNTER — Ambulatory Visit (INDEPENDENT_AMBULATORY_CARE_PROVIDER_SITE_OTHER): Payer: Medicare Other | Admitting: Family Medicine

## 2019-09-21 DIAGNOSIS — E1169 Type 2 diabetes mellitus with other specified complication: Secondary | ICD-10-CM

## 2019-09-21 DIAGNOSIS — Z111 Encounter for screening for respiratory tuberculosis: Secondary | ICD-10-CM | POA: Diagnosis not present

## 2019-09-21 DIAGNOSIS — F200 Paranoid schizophrenia: Secondary | ICD-10-CM | POA: Diagnosis not present

## 2019-09-21 DIAGNOSIS — E876 Hypokalemia: Secondary | ICD-10-CM

## 2019-09-21 DIAGNOSIS — E669 Obesity, unspecified: Secondary | ICD-10-CM

## 2019-09-21 MED ORDER — POTASSIUM CHLORIDE ER 10 MEQ PO TBCR: 10.0000 meq | EXTENDED_RELEASE_TABLET | Freq: Every day | ORAL | 5 refills | Status: DC

## 2019-09-21 NOTE — Telephone Encounter (Signed)
Copied from [place] [id number]. Topic: General - Other >> Sep 21, 2019  1:56 PM Keene Breath wrote: Reason for CRM: Patient needs a FL2 filled out as soon as possible so that she will not be homeless.  CB# [phone], or [phone]

## 2019-09-21 NOTE — Telephone Encounter (Unsigned)
Copied from [place] [id number]. Topic: General - Other >> Sep 21, 2019 11:52 AM Antonieta Iba C wrote: Reason for CRM: Christina with DSS, is calling in to speak with CMA directly. She is looking to help pt with a FL2 form. Pt is currently homeless and is needing placement. Pt is currently staying at a hotel and will not have anywhere to stay after today.   CB: [phone]

## 2019-09-21 NOTE — Progress Notes (Signed)
Name: Lauren [name]   MRN: [id number]    DOB: 18-Feb-1951   Date:09/21/2019       Progress Note  Subjective  Chief Complaint  Chief Complaint  Patient presents with  . FL2 [name]    Needs TB before she is able to get into a home and will be homeless after tomorrow at noon    I connected with  Lauren [name] on 09/21/19 at  4:00 PM EST by telephone and verified that I am speaking with the correct person using two identifiers.  I discussed the limitations, risks, security and privacy concerns of performing an evaluation and management service by telephone and the availability of in person appointments. Staff also discussed with the patient that there may be a patient responsible charge related to this service. Patient Location: at [facility]  Provider Location: [facility]   HPI  We were contacted by Lauren [name], stating that patient is in the process of getting transferred to a group home or assistant living. She does not have anywhere to go now and will be homeless if FL2 forms and TB screening test are not done. It is very difficulty to get history from patient, she has dysarthria and has heavy breathing. She states she is not sure where her check is going, she does not have a payee and will need a power of attorney or go to another group home to get paid again She states she was staying at a group home but they were yelling at her and was causing more anxiety  DMII: taking metformin, denies side effects, due for A1C level again  Schizophrenia; phq 9 is higher, she is worried about placement, still under the care of Linganore [name] done at [facility]: very mild hypokalemia, mild anemia  Patient Active Problem List   Diagnosis Date Noted  . 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 SS-A antibody positive 04/15/2019  . AKI (acute kidney injury) ([place]) 04/06/2019  . Slurred speech 03/04/2019  . Chest pain in adult 02/15/2019  . Acute respiratory failure  ([place]) 02/15/2019  . Seizure-like activity ([place]) 12/29/2018  . Seizures ([place]) 11/24/2018  . Paresthesias 11/22/2018  . Mouth dryness 10/12/2018  . Hx of resection of meningioma 07/21/2018  . Tremor 07/21/2018  . Schizoaffective disorder ([place]) 12/29/2017  . Morbid obesity ([place]) 12/09/2017  . Meningioma ([place]) 11/25/2017  . Atherosclerosis of aorta ([place]) 11/25/2017  . Calcification of coronary artery 11/25/2017  . Schizophrenia ([place]) 11/04/2017  . Acid reflux 06/29/2015  . Diabetes mellitus type 2, controlled, without complications ([place]) [id number]  . Cardiac murmur 06/29/2015  . Arthritis of knee, degenerative 06/28/2015  . Insomnia w/ sleep apnea 03/21/2015  . Anxiety disorder 03/21/2015  . Hypertension 03/21/2015  . HLD (hyperlipidemia) 03/21/2015  . Urge incontinence 03/21/2015    Past Surgical History:  Procedure Laterality Date  . BRAIN TUMOR EXCISION  2012   benign  . CARDIAC CATHETERIZATION  2008  . CESAREAN SECTION    . CYST REMOVAL HAND    . LUNG LOBECTOMY  1977   benign tumor  . TOTAL KNEE ARTHROPLASTY Left 01/28/2017   Procedure: TOTAL KNEE ARTHROPLASTY;  Surgeon: Corky Mull, MD;  Location: [facility] ORS;  Service: Orthopedics;  Laterality: Left;    Family History  Problem Relation Age of Onset  . Heart disease Brother   . Depression Mother   . Heart attack Mother   . Stroke Mother   . Alcohol abuse Father   .  Stroke Father   . Diabetes Sister   . Diabetes Sister   . Stomach cancer Sister   . Kidney disease Sister   . COPD Brother   . Lung cancer Brother   . Diabetes Brother       Current Outpatient Medications:  .  aspirin EC 81 MG tablet, Take 1 tablet (81 mg total) by mouth daily before breakfast., Disp: 30 tablet, Rfl: 5 .  atorvastatin (LIPITOR) 40 MG tablet, Take 1 tablet (40 mg total) by mouth daily at 6 PM., Disp: 30 tablet, Rfl: 2 .  benztropine (COGENTIN) 1 MG tablet, Take 1-2 tablets (1-2 mg total) by mouth 2 (two) times daily. One tablet  (1mg ) in the morning and two tablets (2mg ) at bedtime, Disp: 120 tablet, Rfl: 2 .  escitalopram (LEXAPRO) 10 MG tablet, Take 1 tablet (10 mg total) by mouth daily., Disp: 90 tablet, Rfl: 1 .  gabapentin (NEURONTIN) 100 MG capsule, Take 1 capsule (100 mg total) by mouth 2 (two) times daily., Disp: 180 capsule, Rfl: 1 .  haloperidol decanoate (HALDOL DECANOATE) 100 MG/ML injection, Inject 100 mg into the muscle every 28 (twenty-eight) days. , Disp: , Rfl:  .  hydrochlorothiazide (HYDRODIURIL) 12.5 MG tablet, Take 1 tablet (12.5 mg total) by mouth daily. New dose, stop hctz 25 mg, Disp: 90 tablet, Rfl: 0 .  levETIRAcetam (KEPPRA) 500 MG tablet, Take 1 tablet (500 mg total) by mouth 2 (two) times daily., Disp: 60 tablet, Rfl: 2 .  lisinopril (ZESTRIL) 20 MG tablet, Take 1 tablet (20 mg total) by mouth daily., Disp: 30 tablet, Rfl: 5 .  metFORMIN (GLUCOPHAGE) 500 MG tablet, Take 1 tablet (500 mg total) by mouth 2 (two) times daily with a meal., Disp: 30 tablet, Rfl: 2 .  potassium chloride (KLOR-CON) 10 MEQ tablet, Take 1 tablet (10 mEq total) by mouth 2 (two) times daily., Disp: 60 tablet, Rfl: 0 .  traZODone (DESYREL) 50 MG tablet, Take 1 tablet (50 mg total) by mouth at bedtime., Disp: 30 tablet, Rfl: 3 .  pilocarpine (SALAGEN) 5 MG tablet, Take 1 tablet (5 mg total) by mouth 3 (three) times daily. (Patient not taking: Reported on 09/21/2019), Disp: 90 tablet, Rfl: 2  Allergies  Allergen Reactions  . Percocet [Oxycodone-Acetaminophen] Diarrhea, Nausea And Vomiting and Nausea Only  . Tramadol Hcl Diarrhea, Nausea And Vomiting and Nausea Only  . Vicodin [Hydrocodone-Acetaminophen] Diarrhea, Nausea And Vomiting and Nausea Only    I personally reviewed active problem list, medication list, allergies with the patient/caregiver today.   ROS  Ten systems reviewed and is negative except as mentioned in HPI  She has tremors, chronic sob, urge incontinence  Objective  Virtual encounter, vitals not  obtained.  There is no height or weight on file to calculate BMI.  Physical Exam  Awake, alert and oriented  PHQ2/9: Depression screen [facility] 2/9 09/21/2019 06/07/2019 04/22/2019 02/26/2019 12/04/2018  Decreased Interest 1 0 0 1 0  Down, Depressed, Hopeless 1 0 0 1 0  PHQ - 2 Score 2 0 0 2 0  Altered sleeping 2 0 0 1 0  Tired, decreased energy 2 0 0 2 3  Change in appetite 1 0 0 3 1  Feeling bad or failure about yourself  3 0 0 0 0  Trouble concentrating 2 0 0 0 0  Moving slowly or fidgety/restless 1 0 0 0 1  Suicidal thoughts 0 0 0 0 0  PHQ-9 Score 13 0 0 8 5  Difficult doing work/chores  Somewhat difficult Not difficult at all Not difficult at all Somewhat difficult -  Some recent data might be hidden   PHQ-2/9 Result is positive.    Fall Risk: Fall Risk  09/21/2019 06/07/2019 04/22/2019 02/26/2019 12/04/2018  Falls in the past year? 0 0 0 0 1  Number falls in past yr: 0 0 0 0 1  Injury with Fall? 0 0 0 0 0  Risk for fall due to : - - - - -  Follow up - - Falls evaluation completed - -     Assessment & Plan  1. Paranoid schizophrenia ([place])  - Ambulatory referral to Chronic Care Management Services  2. Screening-pulmonary TB  - QuantiFERON-TB Gold Plus  3. Hypokalemia  - potassium chloride (KLOR-CON) 10 MEQ tablet; Take 1 tablet (10 mEq total) by mouth daily.  Dispense: 30 tablet; Refill: 5  4. Hypomagnesemia  - potassium chloride (KLOR-CON) 10 MEQ tablet; Take 1 tablet (10 mEq total) by mouth daily.  Dispense: 30 tablet; Refill: 5  5. Diabetes mellitus type 2 in obese (HCC)  - Hemoglobin A1c.  I discussed the assessment and treatment plan with the patient. The patient was provided an opportunity to ask questions and all were answered. The patient agreed with the plan and demonstrated an understanding of the instructions.   The patient was advised to call back or seek an in-person evaluation if the symptoms worsen or if the condition fails to improve as  anticipated.  I provided 25  minutes of non-face-to-face time during this encounter.  Loistine Chance, MD

## 2019-09-22 ENCOUNTER — Ambulatory Visit: Payer: Medicare Other | Admitting: Family Medicine

## 2019-09-22 NOTE — Telephone Encounter (Signed)
This was taken care of yesterday

## 2019-09-23 ENCOUNTER — Telehealth: Payer: Self-pay

## 2019-09-24 ENCOUNTER — Telehealth: Payer: Self-pay

## 2019-09-27 ENCOUNTER — Telehealth: Payer: Self-pay | Admitting: *Deleted

## 2019-09-28 ENCOUNTER — Ambulatory Visit: Payer: Self-pay | Admitting: *Deleted

## 2019-09-28 NOTE — Chronic Care Management (AMB) (Signed)
  Chronic Care Management   Social Work Note  09/28/2019 Name: Lauren [name] MRN: [id number] DOB: October 25, 1950  Lauren [name] is a 69 y.o. year old female who sees Steele Sizer, MD for primary care. The CCM team was consulted for assistance with [employer] .   Phone call to patient to follow up with her regarding community resource needs. However the number listed in the chart is the number for a previous group home she lived in. This Education officer, museum contacted patient's sister, Lauren [name] who reports that patient is now living with a cousin  in [place] until the Department of [place] can find her a group home placement. According to patient's sister, Lauren [name] remains involved and they are trying to establish a payee for patient through the Department of Social Services.   Outpatient Encounter Medications as of 09/28/2019  Medication Sig Note  . aspirin EC 81 MG tablet Take 1 tablet (81 mg total) by mouth daily before breakfast.   . atorvastatin (LIPITOR) 40 MG tablet Take 1 tablet (40 mg total) by mouth daily at 6 PM.   . benztropine (COGENTIN) 1 MG tablet Take 1-2 tablets (1-2 mg total) by mouth 2 (two) times daily. One tablet (1mg ) in the morning and two tablets (2mg ) at bedtime   . escitalopram (LEXAPRO) 10 MG tablet Take 1 tablet (10 mg total) by mouth daily.   [name] gabapentin (NEURONTIN) 100 MG capsule Take 1 capsule (100 mg total) by mouth 2 (two) times daily.   . haloperidol decanoate (HALDOL DECANOATE) 100 MG/ML injection Inject 100 mg into the muscle every 28 (twenty-eight) days.  09/18/2019: Filled 08/30/2019  . hydrochlorothiazide (HYDRODIURIL) 12.5 MG tablet Take 1 tablet (12.5 mg total) by mouth daily. New dose, stop hctz 25 mg   . levETIRAcetam (KEPPRA) 500 MG tablet Take 1 tablet (500 mg total) by mouth 2 (two) times daily.   [name] lisinopril (ZESTRIL) 20 MG tablet Take 1 tablet (20 mg total) by mouth daily.   . metFORMIN (GLUCOPHAGE) 500 MG tablet  Take 1 tablet (500 mg total) by mouth 2 (two) times daily with a meal.   . pilocarpine (SALAGEN) 5 MG tablet Take 1 tablet (5 mg total) by mouth 3 (three) times daily. (Patient not taking: Reported on 09/21/2019)   . potassium chloride (KLOR-CON) 10 MEQ tablet Take 1 tablet (10 mEq total) by mouth daily.   . traZODone (DESYREL) 50 MG tablet Take 1 tablet (50 mg total) by mouth at bedtime.    No facility-administered encounter medications on file as of 09/28/2019.    Goals Addressed   None     Follow Up Plan:  No follow up needed at this time as patient is active with the Department of Social Services as well as Lauren [name] in regards to assisting with placement and identifying a payee for patient.  Elliot Gurney, [place] Administrator, arts Center/[facility] Care Management [phone]

## 2019-09-30 ENCOUNTER — Telehealth: Payer: Self-pay

## 2019-10-08 ENCOUNTER — Ambulatory Visit (INDEPENDENT_AMBULATORY_CARE_PROVIDER_SITE_OTHER): Payer: Medicare Other | Admitting: Family Medicine

## 2019-10-08 ENCOUNTER — Other Ambulatory Visit: Payer: Self-pay

## 2019-10-08 ENCOUNTER — Encounter: Payer: Self-pay | Admitting: Family Medicine

## 2019-10-08 DIAGNOSIS — E669 Obesity, unspecified: Secondary | ICD-10-CM

## 2019-10-08 DIAGNOSIS — I700 Atherosclerosis of aorta: Secondary | ICD-10-CM

## 2019-10-08 DIAGNOSIS — E1169 Type 2 diabetes mellitus with other specified complication: Secondary | ICD-10-CM

## 2019-10-08 DIAGNOSIS — E782 Mixed hyperlipidemia: Secondary | ICD-10-CM

## 2019-10-08 DIAGNOSIS — R251 Tremor, unspecified: Secondary | ICD-10-CM

## 2019-10-08 DIAGNOSIS — R0602 Shortness of breath: Secondary | ICD-10-CM

## 2019-10-08 DIAGNOSIS — F200 Paranoid schizophrenia: Secondary | ICD-10-CM

## 2019-10-08 DIAGNOSIS — D329 Benign neoplasm of meninges, unspecified: Secondary | ICD-10-CM

## 2019-10-08 DIAGNOSIS — I10 Essential (primary) hypertension: Secondary | ICD-10-CM

## 2019-10-08 NOTE — Progress Notes (Signed)
Name: Lauren [name]   MRN: [id number]    DOB: 1951-03-08   Date:10/08/2019       Progress Note  Subjective  Chief Complaint  Chief Complaint  Patient presents with  . Diabetes  . Schizophrenia  . Shoulder Pain    She has pain in the joint of her right shoulder. She took Ibuprofen this morning and it helped with the pain.     I connected with  [name] on 10/08/19 at  2:20 PM EST by telephone and verified that I am speaking with the correct person using two identifiers.  I discussed the limitations, risks, security and privacy concerns of performing an evaluation and management service by telephone and the availability of in person appointments. Staff also discussed with the patient that there may be a patient responsible charge related to this service. Patient Location: living with cousin -[name] - she lives in [place]  Provider Location: [facility]   HPI  SOB/Tachypenia: seen by Dr. 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  She is now living in  [place] with her cousin , she seems happy during our conversation. She states she has been getting her Haldol shots, last one one week ago. She is still under the care of [place]. Her cousin [name] will be managing her money and paying her bills from now on. She will be paying a rent fee to live there   Recurrence of Meningioma: resection done in 2012 with developed of left side numbness, she went to [facility] and CT showed recurrence 01/2018 . She saw Dr. Lacinda Axon but she did not want to have repeat surgery therefore seen by Dr. Baruch Gouty and s/ p radiation.She denies headaches, nausea, vomiting, problems with her vision but states she has noticed worsening of tremors of right  hand - she sees Dr. Melrose Nakayama for that   Aorta atherosclerosis: found on CT chest done Nov 2018. She is on statin and aspirin daily.No side effects of medication. Unchanged   DMII: had labs done while at [facility], taking metforminand last A1C was  5.9 % Eye exam is up to date. She has polyphagia, polydipsia and polyuria.On statin therapy and aspirin. We will recheck levels when she comes in for her next visit   [id number] now on lisinopril and HCTZ 12.5 mg, denies chest pain or palpitation   Patient Active Problem List   Diagnosis Date Noted  . 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 SS-A antibody positive 04/15/2019  . AKI (acute kidney injury) ([place]) 04/06/2019  . Slurred speech 03/04/2019  . Chest pain in adult 02/15/2019  . Acute respiratory failure ([place]) 02/15/2019  . Seizure-like activity ([place]) 12/29/2018  . Seizures ([place]) 11/24/2018  . Paresthesias 11/22/2018  . Mouth dryness 10/12/2018  . Hx of resection of meningioma 07/21/2018  . Tremor 07/21/2018  . Schizoaffective disorder ([place]) 12/29/2017  . Morbid obesity ([place]) 12/09/2017  . Meningioma ([place]) 11/25/2017  . Atherosclerosis of aorta ([place]) 11/25/2017  . Calcification of coronary artery 11/25/2017  . Schizophrenia ([place]) 11/04/2017  . Acid reflux 06/29/2015  . Diabetes mellitus type 2, controlled, without complications ([place]) [id number]  . Cardiac murmur 06/29/2015  . Arthritis of knee, degenerative 06/28/2015  . Insomnia w/ sleep apnea 03/21/2015  . Anxiety disorder 03/21/2015  .  Hypertension 03/21/2015  . HLD (hyperlipidemia) 03/21/2015  . Urge incontinence 03/21/2015    Past Surgical History:  Procedure Laterality Date  . BRAIN TUMOR EXCISION  2012   benign  . CARDIAC CATHETERIZATION  2008  . CESAREAN SECTION    . CYST REMOVAL HAND    . LUNG LOBECTOMY  1977   benign tumor  . TOTAL KNEE ARTHROPLASTY Left 01/28/2017   Procedure: TOTAL KNEE ARTHROPLASTY;  Surgeon: Corky Mull, MD;  Location: [facility] ORS;  Service:  Orthopedics;  Laterality: Left;    Family History  Problem Relation Age of Onset  . Heart disease Brother   . Depression Mother   . Heart attack Mother   . Stroke Mother   . Alcohol abuse Father   . Stroke Father   . Diabetes Sister   . Diabetes Sister   . Stomach cancer Sister   . Kidney disease Sister   . COPD Brother   . Lung cancer Brother   . Diabetes Brother      Current Outpatient Medications:  .  aspirin EC 81 MG tablet, Take 1 tablet (81 mg total) by mouth daily before breakfast., Disp: 30 tablet, Rfl: 5 .  atorvastatin (LIPITOR) 40 MG tablet, Take 1 tablet (40 mg total) by mouth daily at 6 PM., Disp: 30 tablet, Rfl: 2 .  benztropine (COGENTIN) 1 MG tablet, Take 1-2 tablets (1-2 mg total) by mouth 2 (two) times daily. One tablet (1mg ) in the morning and two tablets (2mg ) at bedtime, Disp: 120 tablet, Rfl: 2 .  escitalopram (LEXAPRO) 10 MG tablet, Take 1 tablet (10 mg total) by mouth daily., Disp: 90 tablet, Rfl: 1 .  gabapentin (NEURONTIN) 100 MG capsule, Take 1 capsule (100 mg total) by mouth 2 (two) times daily., Disp: 180 capsule, Rfl: 1 .  haloperidol decanoate (HALDOL DECANOATE) 100 MG/ML injection, Inject 100 mg into the muscle every 28 (twenty-eight) days. , Disp: , Rfl:  .  hydrochlorothiazide (HYDRODIURIL) 12.5 MG tablet, Take 1 tablet (12.5 mg total) by mouth daily. New dose, stop hctz 25 mg, Disp: 90 tablet, Rfl: 0 .  levETIRAcetam (KEPPRA) 500 MG tablet, Take 1 tablet (500 mg total) by mouth 2 (two) times daily., Disp: 60 tablet, Rfl: 2 .  lisinopril (ZESTRIL) 20 MG tablet, Take 1 tablet (20 mg total) by mouth daily., Disp: 30 tablet, Rfl: 5 .  metFORMIN (GLUCOPHAGE) 500 MG tablet, Take 1 tablet (500 mg total) by mouth 2 (two) times daily with a meal., Disp: 30 tablet, Rfl: 2 .  pilocarpine (SALAGEN) 5 MG tablet, Take 1 tablet (5 mg total) by mouth 3 (three) times daily., Disp: 90 tablet, Rfl: 2 .  potassium chloride (KLOR-CON) 10 MEQ tablet, Take 1 tablet (10  mEq total) by mouth daily., Disp: 30 tablet, Rfl: 5 .  traZODone (DESYREL) 50 MG tablet, Take 1 tablet (50 mg total) by mouth at bedtime., Disp: 30 tablet, Rfl: 3  Allergies  Allergen Reactions  . Percocet [Oxycodone-Acetaminophen] Diarrhea, Nausea And Vomiting and Nausea Only  . Tramadol Hcl Diarrhea, Nausea And Vomiting and Nausea Only  . Vicodin [Hydrocodone-Acetaminophen] Diarrhea, Nausea And Vomiting and Nausea Only    I personally reviewed active problem list, medication list, allergies, family history, social history, health maintenance with the patient/caregiver today.   ROS  Ten systems reviewed and is negative except as mentioned in HPI   Objective  Virtual encounter, vitals not obtained.  There is no height or weight on file to calculate BMI.  Physical Exam  Awake, alert and oriented  PHQ2/9: Depression screen [facility] 2/9 09/21/2019 06/07/2019 04/22/2019 02/26/2019 12/04/2018  Decreased Interest 1 0 0 1 0  Down, Depressed, Hopeless 1 0 0 1 0  PHQ - 2 Score 2 0 0 2 0  Altered sleeping 2 0 0 1 0  Tired, decreased energy 2 0 0 2 3  Change in appetite 1 0 0 3 1  Feeling bad or failure about yourself  3 0 0 0 0  Trouble concentrating 2 0 0 0 0  Moving slowly or fidgety/restless 1 0 0 0 1  Suicidal thoughts 0 0 0 0 0  PHQ-9 Score 13 0 0 8 5  Difficult doing work/chores Somewhat difficult Not difficult at all Not difficult at all Somewhat difficult -  Some recent data might be hidden   PHQ-2/9 Result is positive.    Fall Risk: Fall Risk  10/08/2019 09/21/2019 06/07/2019 04/22/2019 02/26/2019  Falls in the past year? 0 0 0 0 0  Number falls in past yr: 0 0 0 0 0  Injury with Fall? 0 0 0 0 0  Risk for fall due to : - - - - -  Follow up - - - Falls evaluation completed -     Assessment & Plan  1. Paranoid schizophrenia ([place])   2. Diabetes mellitus type 2 in obese [facility])  Check labs next visit   3. Atherosclerosis of aorta ([place])  On statin   4. Meningioma ([place])   stable  5. Tremor of both hands  A little worse on the right hand advised to follow up with Dr. Melrose Nakayama  6. Essential hypertension   7. SOB (shortness of breath)  Stable  8. Mixed hyperlipidemia   I discussed the assessment and treatment plan with the patient. The patient was provided an opportunity to ask questions and all were answered. The patient agreed with the plan and demonstrated an understanding of the instructions.   The patient was advised to call back or seek an in-person evaluation if the symptoms worsen or if the condition fails to improve as anticipated.  I provided 25 minutes of non-face-to-face time during this encounter.  Loistine Chance, MD

## 2019-10-11 ENCOUNTER — Other Ambulatory Visit: Payer: Self-pay

## 2019-10-11 NOTE — Patient Outreach (Signed)
[facility]) Care Management  10/11/2019  Lauren [name] [facility] 1951/05/15 [id number]   Medication Adherence call to Lauren [name] Telephone call to Patient regarding Medication Adherence unable to reach patient. Patient did not answer patient is past due on Metformin 500 mg under [place].   [place] Management Direct Dial [phone]  Fax [phone] [email or web address]

## 2019-10-26 ENCOUNTER — Other Ambulatory Visit: Payer: Self-pay | Admitting: Family Medicine

## 2019-10-26 ENCOUNTER — Ambulatory Visit (INDEPENDENT_AMBULATORY_CARE_PROVIDER_SITE_OTHER): Payer: Medicare Other

## 2019-10-26 DIAGNOSIS — Z1231 Encounter for screening mammogram for malignant neoplasm of breast: Secondary | ICD-10-CM

## 2019-10-26 DIAGNOSIS — Z0000 Encounter for general adult medical examination without abnormal findings: Secondary | ICD-10-CM | POA: Diagnosis not present

## 2019-10-26 DIAGNOSIS — I10 Essential (primary) hypertension: Secondary | ICD-10-CM

## 2019-10-26 MED ORDER — ATORVASTATIN CALCIUM 40 MG PO TABS: 40.0000 mg | ORAL_TABLET | Freq: Every day | ORAL | 1 refills | Status: DC

## 2019-10-26 MED ORDER — HYDROCHLOROTHIAZIDE 12.5 MG PO TABS: 12.5000 mg | ORAL_TABLET | Freq: Every day | ORAL | 1 refills | Status: DC

## 2019-10-26 MED ORDER — LEVETIRACETAM 500 MG PO TABS: 500.0000 mg | ORAL_TABLET | Freq: Two times a day (BID) | ORAL | 1 refills | Status: DC

## 2019-10-26 NOTE — Telephone Encounter (Signed)
atorvastatin (LIPITOR) 40 MG tablet hydrochlorothiazide (HYDRODIURIL) 12.5 MG tablet  levETIRAcetam (KEPPRA) 500 MG tablet [id number]        Patient is requesting refill.    Pharmacy:  [facility], [place]

## 2019-10-26 NOTE — Progress Notes (Addendum)
Subjective:   Lauren [name] is a 69 y.o. female who presents for Medicare Annual (Subsequent) preventive examination.  Virtual Visit via Telephone Note  I connected with Lauren [name] on 10/26/19 at  1:30 PM EST by telephone and verified that I am speaking with the correct person using two identifiers.  Medicare Annual Wellness visit completed telephonically due to Covid-19 pandemic.   Location: Patient: home Provider: office   I discussed the limitations, risks, security and privacy concerns of performing an evaluation and management service by telephone and the availability of in person appointments. The patient expressed understanding and agreed to proceed.  Some vital signs may be absent or patient reported.   Clemetine Marker, LPN    Review of Systems:   Cardiac Risk Factors include: advanced age (>54men, >69 women);diabetes mellitus;dyslipidemia;hypertension;obesity (BMI >30kg/m2)     Objective:     Vitals: There were no vitals taken for this visit.  There is no height or weight on file to calculate BMI.  Advanced Directives 09/16/2019 06/24/2019 05/03/2019 04/26/2019 04/07/2019 04/06/2019 04/06/2019  Does Patient Have a Medical Advance Directive? No No No No No No;Yes No  Does patient want to make changes to medical advance directive? - - - No - Guardian declined No - Guardian declined Yes (Inpatient - patient requests chaplain consult to change a medical advance directive) -  Would patient like information on creating a medical advance directive? No - Patient declined No - Patient declined No - Patient declined No - Patient declined - No - Patient declined No - Patient declined  Some encounter information is confidential and restricted. Go to Review Flowsheets activity to see all data.    Tobacco Social History   Tobacco Use  Smoking Status Never Smoker  Smokeless Tobacco Never Used     Counseling given: Not Answered   Clinical Intake:  Pre-visit  preparation completed: Yes  Pain : 0-10 Pain Score: 5  Pain Type: Chronic pain Pain Location: Shoulder Pain Orientation: Right, Left Pain Descriptors / Indicators: Aching, Nagging Pain Onset: More than a month ago Pain Frequency: Intermittent     Nutritional Risks: None Diabetes: Yes CBG done?: No Did pt. bring in CBG monitor from home?: No   Nutrition Risk Assessment:  Has the patient had any N/V/D within the last 2 months?  No  Does the patient have any non-healing wounds?  No  Has the patient had any unintentional weight loss or weight gain?  No   Diabetes:  Is the patient diabetic?  Yes  If diabetic, was a CBG obtained today?  No  Did the patient bring in their glucometer from home?  No  How often do you monitor your CBG's? Pt does not check blood sugar.   Financial Strains and Diabetes Management:  Are you having any financial strains with the device, your supplies or your medication? No .  Does the patient want to be seen by Chronic Care Management for management of their diabetes?  No  Would the patient like to be referred to a Nutritionist or for Diabetic Management?  No   Diabetic Exams:  Diabetic Eye Exam: Completed 03/02/18 negative retinopathy. Pt states she went last year but we need updated report.  Diabetic Foot Exam: Completed 02/26/19.    How often do you need to have someone help you when you read instructions, pamphlets, or other written materials from your doctor or pharmacy?: 2 - Rarely  Interpreter Needed?: No  Information entered by :: Clemetine Marker  LPN  Past Medical History:  Diagnosis Date  . Anxiety   . Apnea, sleep 02/02/2014  . Awareness of heartbeats 02/02/2014  . Breathlessness on exertion 02/02/2014  . Diabetes mellitus   . Encounter for pre-employment examination 06/29/2015  . Excessive sweating 07/05/2015  . Fibromyalgia   . GERD (gastroesophageal reflux disease)   . Gravida 1 10/26/2015   1.    . Heart murmur   . Herniated disc    . Hyperlipidemia   . Hypertension   . Itch of skin 10/26/2015  . Lack of bladder control   . Lung tumor   . Rheumatoid arthritis ([place])   . Schizoaffective disorder ([place])   . Screening for cervical cancer 07/29/2017  . 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 last seizure 2013!  [name] Sex counseling 10/26/2015  . Sleep apnea   . 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 Stiffness of both knees 06/22/2015  . Stroke [facility])    Past Surgical History:  Procedure Laterality Date  . BRAIN TUMOR EXCISION  2012   benign  . CARDIAC CATHETERIZATION  2008  . CESAREAN SECTION    . CYST REMOVAL HAND    . LUNG LOBECTOMY  1977   benign tumor  . 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 Heart disease Brother   . Depression Mother   . Heart attack Mother   . Stroke Mother   . Alcohol abuse Father   . Stroke Father   . Diabetes Sister   . Diabetes Sister   . Stomach cancer Sister   . Kidney disease Sister   . COPD Brother   . Lung cancer Brother   . Diabetes Brother    Social History   Socioeconomic History  . Marital status: Single    Spouse name: Not on file  . Number of children: 1  . Years of education: Not on file  . Highest education level: 12th grade  Occupational History  . Occupation: disbaled  Tobacco Use  . Smoking status: Never Smoker  . Smokeless tobacco: Never Used  Substance and Sexual Activity  . Alcohol use: No    Alcohol/week: 0.0 standard drinks  . Drug use: No  . Sexual activity: Not Currently  Other Topics Concern  . Not on file  Social History Narrative   Pt currently living with her cousin in [place]    Social Determinants of Health   Financial Resource Strain: Medium Risk  . Difficulty of Paying Living Expenses: Somewhat hard  Food  Insecurity: No Food Insecurity  . Worried About Charity fundraiser in the Last Year: Never true  . Ran Out of Food in the Last Year: Never true  Transportation Needs: No Transportation Needs  . Lack of Transportation (Medical): No  . Lack of Transportation (Non-Medical): No  Physical Activity: Inactive  . Days of Exercise per Week: 0 days  . Minutes of Exercise per Session: 0 min  Stress: Stress Concern Present  . Feeling of Stress : To some extent  Social Connections: Unknown  . Frequency of Communication with Friends and Family: Patient refused  . Frequency of Social Gatherings with Friends and Family: Patient refused  . Attends Religious Services: Patient refused  . Active Member of Clubs or Organizations: Patient refused  .  Attends Archivist Meetings: Patient refused  . Marital Status: Never married    Outpatient Encounter Medications as of 10/26/2019  Medication Sig  . aspirin EC 81 MG tablet Take 1 tablet (81 mg total) by mouth daily before breakfast.  . atorvastatin (LIPITOR) 40 MG tablet Take 1 tablet (40 mg total) by mouth daily at 6 PM.  . benztropine (COGENTIN) 1 MG tablet Take 1-2 tablets (1-2 mg total) by mouth 2 (two) times daily. One tablet (1mg ) in the morning and two tablets (2mg ) at bedtime  . escitalopram (LEXAPRO) 10 MG tablet Take 1 tablet (10 mg total) by mouth daily.  [name] gabapentin (NEURONTIN) 100 MG capsule Take 1 capsule (100 mg total) by mouth 2 (two) times daily.  . haloperidol decanoate (HALDOL DECANOATE) 100 MG/ML injection Inject 100 mg into the muscle every 28 (twenty-eight) days.   . hydrochlorothiazide (HYDRODIURIL) 12.5 MG tablet Take 1 tablet (12.5 mg total) by mouth daily. New dose, stop hctz 25 mg  . levETIRAcetam (KEPPRA) 500 MG tablet Take 1 tablet (500 mg total) by mouth 2 (two) times daily.  [name] lisinopril (ZESTRIL) 20 MG tablet Take 1 tablet (20 mg total) by mouth daily.  . metFORMIN (GLUCOPHAGE) 500 MG tablet Take 1 tablet (500 mg  total) by mouth 2 (two) times daily with a meal.  . potassium chloride (KLOR-CON) 10 MEQ tablet Take 1 tablet (10 mEq total) by mouth daily.  . traZODone (DESYREL) 50 MG tablet Take 1 tablet (50 mg total) by mouth at bedtime.   No facility-administered encounter medications on file as of 10/26/2019.    Activities of Daily Living In your present state of health, do you have any difficulty performing the following activities: 10/26/2019 10/08/2019  Hearing? N N  Comment declines hearing aids -  Vision? N N  Difficulty concentrating or making decisions? Y Y  Comment - -  Walking or climbing stairs? Y Y  Comment - -  Dressing or bathing? N N  Doing errands, shopping? N [place] and eating ? N -  Using the Toilet? N -  In the past six months, have you accidently leaked urine? N -  Do you have problems with loss of bowel control? N -  Managing your Medications? Y -  Managing your Finances? Y -  Housekeeping or managing your Housekeeping? Y -  Some recent data might be hidden    Patient Care Team: Steele Sizer, MD as PCP - General (Family Medicine) [place]. (Psychiatry) Deetta Perla, MD as Consulting Physician (Neurosurgery) Anabel Bene, MD as Referring Physician (Neurology) Noreene Filbert, MD as Referring Physician (Radiation Oncology) Cathi Roan, [facility] (Pharmacist) Vern Claude, LCSW as [place], [place], [place] as Social Worker    Assessment:   This is a routine wellness examination for Lauren [name].  Exercise Activities and Dietary recommendations Current Exercise Habits: The patient does not participate in regular exercise at present, Exercise limited by: respiratory conditions(s);cardiac condition(s)  Goals    . "I need to find a new place to live"     Current Barriers:  . Housing barriers  Clinical Social Work Clinical Goal(s):  [name] Over the next 90 days,  patient will work with her case Freight forwarder through [employer] and the ACT program  to address needs related to housing   Interventions: . Patient interviewed and appropriate assessments performed . Confirmed that patient is residing  temporarily at the [facility] however would like to move into affordable housing . Confirmed that patient is paid up through 09/12/19 and is currently working with her case manager with [name] to find affordable housing . Confirmed that patient is also working with the ACT team to identify housing with plan to reside with sister until affordable housing is located . Advised patient to follow up with [name] and the ACT Team regarding housing needs.  Patient Self Care Activities:  . Patient verbalizes understanding of plan to work with her [place] worker and ACT team to  identify affordable housing  . Unable to independently afford housing  . Unable to perform IADLs independently  Initial goal documentation\     . My medicine is expensive (pt-stated)     Clinical Goal(s): Over the next 10 days, Ms.Oswaldo Milian will provide the necessary supplementary documents (proof of out of pocket prescription expenditure, proof of household income) needed for medication assistance applications to CCM pharmacist.   Interventions:  09/22/17: Patient states that [name] is covering her injection costs and she doesn't need to apply for assistance. She does have to pay [facility] her maintenance medications at [facility] and owes about $140, but she likes having her medications bubble packaged (instead of using mail order).  10/15/2018  Notified CCM RN CM of conversation with patient regarding medication costs.   Discussed with patient reason for transferring from mail order to pill pack pharmacy  Reactivated previously completed goal for continued assistance  See Past Updates for further interventions     . Weight (lb) < 200 lb (90.7 kg)     Recommend  weight loss of 60+ pounds. Discussed what a healthy diet consists of.        Fall Risk Fall Risk  10/26/2019 10/08/2019 09/21/2019 06/07/2019 04/22/2019  Falls in the past year? 0 0 0 0 0  Number falls in past yr: 0 0 0 0 0  Injury with Fall? 0 0 0 0 0  Risk for fall due to : No Fall Risks - - - -  Follow up Falls prevention discussed - - - Falls evaluation completed   FALL RISK PREVENTION PERTAINING TO THE HOME:  Any stairs in or around the home? No  If so, do they handrails? No   Home free of loose throw rugs in walkways, pet beds, electrical cords, etc? Yes  Adequate lighting in your home to reduce risk of falls? Yes   ASSISTIVE DEVICES UTILIZED TO PREVENT FALLS:  Life alert? No  Use of a cane, walker or w/c? No  Grab bars in the bathroom? Yes  Shower chair or bench in shower? No  Elevated toilet seat or a handicapped toilet? Yes   DME ORDERS:  DME order needed?  No   TIMED UP AND GO:  Was the test performed? No . Telephonic visit.   Education: Fall risk prevention has been discussed.  Intervention(s) required? No   Depression Screen PHQ 2/9 Scores 10/26/2019 10/08/2019 09/21/2019 06/07/2019  PHQ - 2 Score 0 - 2 0  PHQ- 9 Score 7 - 13 0  Exception Documentation - Medical reason - -     Cognitive Function 6CIT deferred for 2021 AWV; pt followed by neurology and declined to complete today.       6CIT Screen 10/22/2018 05/26/2017  What Year? 0 points 0 points  What month? 0 points 0 points  What time? 0 points 0 points  Count back from 20  2 points 0 points  Months in reverse 2 points 0 points  Repeat phrase 2 points 2 points  Total Score 6 2    Immunization History  Administered Date(s) Administered  . Fluad Quad(high Dose 65+) 06/09/2019  . Influenza, High Dose Seasonal PF 05/07/2018  . Influenza,inj,Quad PF,6+ Mos 06/22/2015, 05/30/2016, 05/26/2017  . PPD Test 04/08/2019  . Pneumococcal Conjugate-13 04/01/2018  . Pneumococcal Polysaccharide-23 06/09/2019  .  Tdap 08/20/2010    Qualifies for Shingles Vaccine? Yes . Due for Shingrix. Education has been provided regarding the importance of this vaccine. Pt has been advised to call insurance company to determine out of pocket expense. Advised may also receive vaccine at local pharmacy or Health Dept. Verbalized acceptance and understanding.  Tdap: Up to date  Flu Vaccine: Up to date  Pneumococcal Vaccine: Up to date   Screening Tests Health Maintenance  Topic Date Due  . DEXA SCAN  01/08/2018  . MAMMOGRAM  04/30/2018  . OPHTHALMOLOGY EXAM  03/03/2019  . HEMOGLOBIN A1C  12/07/2019  . FOOT EXAM  02/26/2020  . TETANUS/TDAP  08/20/2020  . COLONOSCOPY  12/10/2023  . INFLUENZA VACCINE  Completed  . Hepatitis C Screening  Completed  . PNA vac Low Risk Adult  Completed    Cancer Screenings:  Colorectal Screening: Completed 2015. Repeat every 10 years;    Mammogram: Completed 04/30/17. Repeat every year. Ordered today. Pt provided with contact information and advised to call to schedule appt.   Bone Density: Due. Ordered 06/07/19. Pt provided with contact information and advised to call to schedule appt.   Lung Cancer Screening: (Low Dose CT Chest recommended if Age 47-80 years, 30 pack-year currently smoking OR have quit w/in 15years.) does not qualify.   Additional Screening:  Hepatitis C Screening: does qualify; Completed 05/26/18  Vision Screening: Recommended annual ophthalmology exams for early detection of glaucoma and other disorders of the eye. Is the patient up to date with their annual eye exam?  Yes  Who is the provider or what is the name of the office in which the pt attends annual eye exams? [facility]  Dental Screening: Recommended annual dental exams for proper oral hygiene  Community Resource Referral:  CRR required this visit?  No      Plan:     I have personally reviewed and addressed the Medicare Annual Wellness questionnaire and have noted the following in the  patient's chart:  A. Medical and social history B. Use of alcohol, tobacco or illicit drugs  C. Current medications and supplements D. Functional ability and status E.  Nutritional status F.  Physical activity G. Advance directives H. List of other physicians I.  Hospitalizations, surgeries, and ER visits in previous 12 months J.  [place] such as hearing and vision if needed, cognitive and depression L. Referrals and appointments   In addition, I have reviewed and discussed with patient certain preventive protocols, quality metrics, and best practice recommendations. A written personalized care plan for preventive services as well as general preventive health recommendations were provided to patient.   Signed,  Clemetine Marker, LPN Nurse Health Advisor   Nurse Notes: Patient states she is living with her cousin Janine Limbo in [place] and has had all medication transferred to pharmacy there. Contacted [facility] in [place] and confirmed this is her new pharmacy and she is receiving bubble packed medications. Pharmacy will need refills on maintenance medications soon as last rx's filled by [facility] hospital provider Dr. Charna Archer. Pharmacy tech states  they will need refills on the following:  Benztropine 1 mg Trazodone 50 mg Atorvastatin 40 mg hctz 12.5 mg metforming 500 mg levetiracetam 500 mg  Patient also requested rx for Nystatin powder due to irritation under her breasts. Advised patient may need to be seen for this problem and encouraged patient to make sure she is wearing a well fitting and supportive bra as well as drying off completely after bathing and cleaning thoroughly if moisture or sweating occurs.

## 2019-10-26 NOTE — Patient Instructions (Signed)
Lauren [name] , Thank you for taking time to come for your Medicare Wellness Visit. I appreciate your ongoing commitment to your health goals. Please review the following plan we discussed and let me know if I can assist you in the future.   Screening recommendations/referrals: Colonoscopy: done 12/09/13. Repeat in 2025. Mammogram: done 04/30/17. Please call [phone] to schedule your mammogram and bone density screening.  Bone Density: due Recommended yearly ophthalmology/optometry visit for glaucoma screening and checkup Recommended yearly dental visit for hygiene and checkup  Vaccinations: Influenza vaccine: done 06/09/19 Pneumococcal vaccine: done 06/09/19 Tdap vaccine: done 08/20/10 Shingles vaccine: Shingrix discussed. Please contact your pharmacy for coverage information.   Conditions/risks identified: Recommend increasing physical activity   Next appointment: Please follow up in one year for your Medicare Annual Wellness visit.     Preventive Care 44 Years and Older, Female Preventive care refers to lifestyle choices and visits with your health care provider that can promote health and wellness. What does preventive care include?  A yearly physical exam. This is also called an annual well check.  Dental exams once or twice a year.  Routine eye exams. Ask your health care provider how often you should have your eyes checked.  Personal lifestyle choices, including:  Daily care of your teeth and gums.  Regular physical activity.  Eating a healthy diet.  Avoiding tobacco and drug use.  Limiting alcohol use.  Practicing safe sex.  Taking low-dose aspirin every day.  Taking vitamin and mineral supplements as recommended by your health care provider. What happens during an annual well check? The services and screenings done by your health care provider during your annual well check will depend on your age, overall health, lifestyle risk factors, and family history of  disease. Counseling  Your health care provider may ask you questions about your:  Alcohol use.  Tobacco use.  Drug use.  Emotional well-being.  Home and relationship well-being.  Sexual activity.  Eating habits.  History of falls.  Memory and ability to understand (cognition).  Work and work Statistician.  Reproductive health. Screening  You may have the following tests or measurements:  Height, weight, and BMI.  Blood pressure.  Lipid and cholesterol levels. These may be checked every 5 years, or more frequently if you are over 53 years old.  Skin check.  Lung cancer screening. You may have this screening every year starting at age 74 if you have a 30-pack-year history of smoking and currently smoke or have quit within the past 15 years.  Fecal occult blood test (FOBT) of the stool. You may have this test every year starting at age 41.  Flexible sigmoidoscopy or colonoscopy. You may have a sigmoidoscopy every 5 years or a colonoscopy every 10 years starting at age 28.  Hepatitis C blood test.  Hepatitis B blood test.  Sexually transmitted disease (STD) testing.  Diabetes screening. This is done by checking your blood sugar (glucose) after you have not eaten for a while (fasting). You may have this done every 1-3 years.  Bone density scan. This is done to screen for osteoporosis. You may have this done starting at age 9.  Mammogram. This may be done every 1-2 years. Talk to your health care provider about how often you should have regular mammograms. Talk with your health care provider about your test results, treatment options, and if necessary, the need for more tests. Vaccines  Your health care provider may recommend certain vaccines, such as:  Influenza vaccine.  This is recommended every year.  Tetanus, diphtheria, and acellular pertussis (Tdap, Td) vaccine. You may need a Td booster every 10 years.  Zoster vaccine. You may need this after age  40.  Pneumococcal 13-valent conjugate (PCV13) vaccine. One dose is recommended after age 73.  Pneumococcal polysaccharide (PPSV23) vaccine. One dose is recommended after age 31. Talk to your health care provider about which screenings and vaccines you need and how often you need them. This information is not intended to replace advice given to you by your health care provider. Make sure you discuss any questions you have with your health care provider. Document Released: 09/22/2015 Document Revised: 05/15/2016 Document Reviewed: 06/27/2015 Elsevier Interactive Patient Education  2017 [place] Prevention in the Home Falls can cause injuries. They can happen to people of all ages. There are many things you can do to make your home safe and to help prevent falls. What can I do on the outside of my home?  Regularly fix the edges of walkways and driveways and fix any cracks.  Remove anything that might make you trip as you walk through a door, such as a raised step or threshold.  Trim any bushes or trees on the path to your home.  Use bright outdoor lighting.  Clear any walking paths of anything that might make someone trip, such as rocks or tools.  Regularly check to see if handrails are loose or broken. Make sure that both sides of any steps have handrails.  Any raised decks and porches should have guardrails on the edges.  Have any leaves, snow, or ice cleared regularly.  Use sand or salt on walking paths during winter.  Clean up any spills in your garage right away. This includes oil or grease spills. What can I do in the bathroom?  Use night lights.  Install grab bars by the toilet and in the tub and shower. Do not use towel bars as grab bars.  Use non-skid mats or decals in the tub or shower.  If you need to sit down in the shower, use a plastic, non-slip stool.  Keep the floor dry. Clean up any water that spills on the floor as soon as it happens.  Remove  soap buildup in the tub or shower regularly.  Attach bath mats securely with double-sided non-slip rug tape.  Do not have throw rugs and other things on the floor that can make you trip. What can I do in the bedroom?  Use night lights.  Make sure that you have a light by your bed that is easy to reach.  Do not use any sheets or blankets that are too big for your bed. They should not hang down onto the floor.  Have a firm chair that has side arms. You can use this for support while you get dressed.  Do not have throw rugs and other things on the floor that can make you trip. What can I do in the kitchen?  Clean up any spills right away.  Avoid walking on wet floors.  Keep items that you use a lot in easy-to-reach places.  If you need to reach something above you, use a strong step stool that has a grab bar.  Keep electrical cords out of the way.  Do not use floor polish or wax that makes floors slippery. If you must use wax, use non-skid floor wax.  Do not have throw rugs and other things on the floor that can make  you trip. What can I do with my stairs?  Do not leave any items on the stairs.  Make sure that there are handrails on both sides of the stairs and use them. Fix handrails that are broken or loose. Make sure that handrails are as long as the stairways.  Check any carpeting to make sure that it is firmly attached to the stairs. Fix any carpet that is loose or worn.  Avoid having throw rugs at the top or bottom of the stairs. If you do have throw rugs, attach them to the floor with carpet tape.  Make sure that you have a light switch at the top of the stairs and the bottom of the stairs. If you do not have them, ask someone to add them for you. What else can I do to help prevent falls?  Wear shoes that:  Do not have high heels.  Have rubber bottoms.  Are comfortable and fit you well.  Are closed at the toe. Do not wear sandals.  If you use a  stepladder:  Make sure that it is fully opened. Do not climb a closed stepladder.  Make sure that both sides of the stepladder are locked into place.  Ask someone to hold it for you, if possible.  Clearly mark and make sure that you can see:  Any grab bars or handrails.  First and last steps.  Where the edge of each step is.  Use tools that help you move around (mobility aids) if they are needed. These include:  Canes.  Walkers.  Scooters.  Crutches.  Turn on the lights when you go into a dark area. Replace any light bulbs as soon as they burn out.  Set up your furniture so you have a clear path. Avoid moving your furniture around.  If any of your floors are uneven, fix them.  If there are any pets around you, be aware of where they are.  Review your medicines with your doctor. Some medicines can make you feel dizzy. This can increase your chance of falling. Ask your doctor what other things that you can do to help prevent falls. This information is not intended to replace advice given to you by your health care provider. Make sure you discuss any questions you have with your health care provider. Document Released: 06/22/2009 Document Revised: 02/01/2016 Document Reviewed: 09/30/2014 Elsevier Interactive Patient Education  2017 [employer].

## 2019-11-17 ENCOUNTER — Ambulatory Visit: Payer: Self-pay

## 2019-11-17 ENCOUNTER — Telehealth: Payer: Self-pay

## 2019-11-17 NOTE — Chronic Care Management (AMB) (Signed)
  Chronic Care Management   Outreach Note  11/17/2019 Name: Lauren [name] MRN: [id number] DOB: 26-Apr-1951  Primary Care Provider: Steele Sizer, MD Reason for referral : Chronic Case Management    An unsuccessful telephone outreach was attempted today. [name] was previously engaged with the Chronic Case Management team. A request was received to outreach and re-engage for care management and care coordination.    A HIPAA compliant voice message was left today requesting a return call.   Follow Up Plan The care management team will attempt to contact [name] again next week.   [facility] Care Management [phone]

## 2019-11-19 ENCOUNTER — Ambulatory Visit: Payer: Medicare Other | Attending: Internal Medicine

## 2019-11-19 DIAGNOSIS — Z23 Encounter for immunization: Secondary | ICD-10-CM

## 2019-11-19 NOTE — Progress Notes (Signed)
   Covid-19 Vaccination Clinic  Name:  Lauren [name]    MRN: [id number] DOB: [birth year]  11/19/2019  [name] was observed post Covid-19 immunization for 15 minutes without incident. She was provided with Vaccine Information Sheet and instruction to access the V-Safe system.   [name] was instructed to call 911 with any severe reactions post vaccine: [name] Difficulty breathing  . Swelling of face and throat  . A fast heartbeat  . A bad rash all over body  . Dizziness and weakness   Immunizations Administered    Name Date Dose VIS Date Route   Moderna COVID-19 Vaccine 11/19/2019  9:12 AM 0.5 mL 08/10/2019 Intramuscular   Manufacturer: Moderna   Lot: [id number]   [id number]

## 2019-11-23 DIAGNOSIS — G2401 Drug induced subacute dyskinesia: Secondary | ICD-10-CM | POA: Diagnosis not present

## 2019-11-23 DIAGNOSIS — G4701 Insomnia due to medical condition: Secondary | ICD-10-CM | POA: Diagnosis not present

## 2019-11-24 ENCOUNTER — Ambulatory Visit (INDEPENDENT_AMBULATORY_CARE_PROVIDER_SITE_OTHER): Payer: Medicare Other

## 2019-11-24 ENCOUNTER — Ambulatory Visit: Payer: Self-pay

## 2019-11-24 DIAGNOSIS — E1169 Type 2 diabetes mellitus with other specified complication: Secondary | ICD-10-CM

## 2019-11-24 DIAGNOSIS — I10 Essential (primary) hypertension: Secondary | ICD-10-CM | POA: Diagnosis not present

## 2019-11-24 DIAGNOSIS — E119 Type 2 diabetes mellitus without complications: Secondary | ICD-10-CM

## 2019-11-24 DIAGNOSIS — E669 Obesity, unspecified: Secondary | ICD-10-CM | POA: Diagnosis not present

## 2019-11-24 NOTE — Chronic Care Management (AMB) (Signed)
Chronic Care Management   Follow Up Note   11/24/2019 Name: Lauren [name] MRN: [id number] DOB: Apr 04, 1951  Primary Care Provider: Steele Sizer, MD Reason for referral : Chronic Care Management  Lauren [name] is a 69 y.o. year old female who is a primary care patient of Steele Sizer, MD. She was previously engaged with the chronic care management team. She recently relocated to [facility]. A routing telephonic outreach was conducted today with Lauren [name] and her caregiver.  She reports receiving the initial COVID-19 vaccination dose. She anticipates receiving the second dose on 12/22/19.  Review of Lauren [name]'s status, including review of consultants reports, relevant labs and test results was conducted today. Collaboration with appropriate care team members was performed as part of the comprehensive evaluation and provision of chronic care management services.    SDOH (Social Determinants of Health) assessments performed: Yes See Care Plan activities for detailed interventions related to [facility])     Outpatient Encounter Medications as of 11/24/2019  Medication Sig Note  . aspirin EC 81 MG tablet Take 1 tablet (81 mg total) by mouth daily before breakfast.   . atorvastatin (LIPITOR) 40 MG tablet Take 1 tablet (40 mg total) by mouth daily at 6 PM.   . benztropine (COGENTIN) 1 MG tablet Take 1-2 tablets (1-2 mg total) by mouth 2 (two) times daily. One tablet (1mg ) in the morning and two tablets (2mg ) at bedtime   . escitalopram (LEXAPRO) 10 MG tablet Take 1 tablet (10 mg total) by mouth daily.   [name] gabapentin (NEURONTIN) 100 MG capsule Take 1 capsule (100 mg total) by mouth 2 (two) times daily.   . haloperidol decanoate (HALDOL DECANOATE) 100 MG/ML injection Inject 100 mg into the muscle every 28 (twenty-eight) days.  09/18/2019: Filled 08/30/2019  . hydrochlorothiazide (HYDRODIURIL) 12.5 MG tablet Take 1 tablet (12.5 mg total) by mouth daily. New dose, stop  hctz 25 mg   . levETIRAcetam (KEPPRA) 500 MG tablet Take 1 tablet (500 mg total) by mouth 2 (two) times daily.   [name] lisinopril (ZESTRIL) 20 MG tablet Take 1 tablet (20 mg total) by mouth daily.   . metFORMIN (GLUCOPHAGE) 500 MG tablet Take 1 tablet (500 mg total) by mouth 2 (two) times daily with a meal.   . potassium chloride (KLOR-CON) 10 MEQ tablet Take 1 tablet (10 mEq total) by mouth daily.   . traZODone (DESYREL) 50 MG tablet Take 1 tablet (50 mg total) by mouth at bedtime.    No facility-administered encounter medications on file as of 11/24/2019.     Objective:   BP Readings from Last 3 Encounters:  09/18/19 (!) 167/90  07/20/19 (!) 154/91  06/07/19 116/60    Lab Results  Component Value Date   HGBA1C 5.9 (H) 06/09/2019   Lab Results  Component Value Date   CHOL 147 06/09/2019   HDL 40 (L) 06/09/2019   LDLCALC 86 06/09/2019   TRIG 115 06/09/2019   CHOLHDL 3.7 06/09/2019   Functional Status Survey: Is the patient deaf or have difficulty hearing?: No Does the patient have difficulty seeing, even when wearing glasses/contacts?: No Does the patient have difficulty concentrating, remembering, or making decisions?: Yes(Reports  difficulty concentrating.) Does the patient have difficulty walking or climbing stairs?: Yes(Difficulty with stairs.) Does the patient have difficulty dressing or bathing?: No Does the patient have difficulty doing errands alone such as visiting a doctor's office or shopping?: Yes(Family assisting.)   Fall Risk  11/24/2019 10/26/2019 10/08/2019 09/21/2019 06/07/2019  Falls  in the past year? 0 0 0 0 0  Number falls in past yr: - 0 0 0 0  Injury with Fall? - 0 0 0 0  Risk for fall due to : Impaired balance/gait;Medication side effect No Fall Risks - - -  Follow up Falls prevention discussed Falls prevention discussed - - -    Goals Addressed            This Visit's Progress   . Chronic Disease Management       CARE PLAN ENTRY (see longtitudinal  plan of care for additional care plan information)  Current Barriers:  . Chronic Disease Management support and education needs related to Hypertension, Diabetes, Hyperlipidemia.  Case Manager Clinical Goal(s):  [name] Over the next 90 days, patient will be hospitalized due to chronic disease related complications. . Over the next 90 days, patient will attend all medical appointments as scheduled. . Over the next 90 days, patient will take all medications as prescribed. . Over the next 90 days, patient will monitor blood pressure and record readings. . Over the next 90 days, patient will adhere to recommended cardiac prudent/diabetic diet. . Over the next 90 days, patient will follow recommended safety measures to prevent falls. . Over the next 30 days, patient will obtain glucometer and supplies if approved by MD and monitor blood glucose levels daily.   Interventions:  . Provided education regarding medications and indications for use. Advised to take all medications as prescribed. Encouraged to notify MD with concerns regarding prescribed regimen. Encouraged to notify care management team with concerns regarding medication management or prescription costs. Reports receiving medications via compliance/bubble packages. Reports not having updated medication list or packages available at the time of the call. Agreed to bring all medications to her next scheduled PCP visit. Reports [facility] was previously assisting with Haldol injections. Will follow-up to confirm that assistance is still available in [facility]. . Discussed blood pressure parameters and indications for notifying MD. Encouraged to monitor daily and maintain a log (with assistance from caregiver) Reports not currently monitoring her blood pressure at home. . Discussed s/sx of hypoglycemia and hyperglycemia along with treatment recommendations. Encouraged to have caregiver assist with daily blood glucose monitoring. Encouraged to  maintain a log to identify trends. Reports not currently monitoring levels due to not having a glucometer. Agreeable to outreach with provider to request a glucometer and supplies. A1c-5.9% as of 05/2019. [name] Discussed nutritional intake. Encouraged to adhere to recommended cardiac prudent/diabetic diet. Reports family is assisting with meal preparation. Declines current need for nutritional assistance. . Provided education regarding safety and fall prevention measures. Encouraged to change positions slowly and use assistive device when ambulating. Encouraged to request assistance when needed and avoid activities that cause overexertion. . Reviewed pending provider appointments. Encouraged to attend scheduled appointments to prevent delays in care. Pending PCP appointment on 12/06/19. Due for second COVID-19 vaccine on 12/22/19. Reports her caregiver [name] is currently assisting with transportation. . Discussed plans for ongoing care management. Provided direct contact information for care management team.   Patient Self Care Activities:  . Unable to perform ADLs independently . Unable to perform IADLs independently   Please see past updates related to this goal by clicking on the "Past Updates" button in the selected goal           PLAN The care management team will follow up with Lauren [name] in two weeks.   [facility] Care Management [phone]

## 2019-11-24 NOTE — Chronic Care Management (AMB) (Signed)
This encounter was created in error. Please disregard. 

## 2019-11-24 NOTE — Patient Instructions (Addendum)
Thank you for allowing the Chronic Care Management team to participate in your care.  Goals Addressed            This Visit's Progress   . Chronic Disease Management       CARE PLAN ENTRY (see longtitudinal plan of care for additional care plan information)  Current Barriers:  . Chronic Disease Management support and education needs related to Hypertension, Diabetes, Hyperlipidemia.  Case Manager Clinical Goal(s):  [name] Over the next 90 days, patient will be hospitalized due to chronic disease related complications. . Over the next 90 days, patient will attend all medical appointments as scheduled. . Over the next 90 days, patient will take all medications as prescribed. . Over the next 90 days, patient will monitor blood pressure and record readings. . Over the next 90 days, patient will adhere to recommended cardiac prudent/diabetic diet. . Over the next 90 days, patient will follow recommended safety measures to prevent falls. . Over the next 30 days, patient will obtain glucometer and supplies if approved by MD and monitor blood glucose levels daily.   Interventions:  . Provided education regarding medications and indications for use. Advised to take all medications as prescribed. Encouraged to notify MD with concerns regarding prescribed regimen. Encouraged to notify care management team with concerns regarding medication management or prescription costs. Reports receiving medications via compliance/bubble packages. Reports not having updated medication list or packages available at the time of the call. Agreed to bring all medications to her next scheduled PCP visit. Reports [facility] was previously assisting with Haldol injections. Will follow-up to confirm that assistance is still available in [facility]. . Discussed blood pressure parameters and indications for notifying MD. Encouraged to monitor daily and maintain a log (with assistance from caregiver) Reports not currently  monitoring her blood pressure at home. . Discussed s/sx of hypoglycemia and hyperglycemia along with treatment recommendations. Encouraged to have caregiver assist with daily blood glucose monitoring. Encouraged to maintain a log to identify trends. Reports not currently monitoring levels due to not having a glucometer. Agreeable to outreach with provider to request a glucometer and supplies. A1c-5.9% as of 05/2019. [name] Discussed nutritional intake. Encouraged to adhere to recommended cardiac prudent/diabetic diet. Reports family is assisting with meal preparation. Declines current need for nutritional assistance. . Provided education regarding safety and fall prevention measures. Encouraged to change positions slowly and use assistive device when ambulating. Encouraged to request assistance when needed and avoid activities that cause overexertion. . Reviewed pending provider appointments. Encouraged to attend scheduled appointments to prevent delays in care. Pending PCP appointment on 12/06/19. Due for second COVID-19 vaccine on 12/22/19. Reports her caregiver [name] is currently assisting with transportation. . Discussed plans for ongoing care management. Provided direct contact information for care management team.   Patient Self Care Activities:  . Unable to perform ADLs independently . Unable to perform IADLs independently   Please see past updates related to this goal by clicking on the "Past Updates" button in the selected goal         Ms. Myler verbalized understanding of the instructions provided during the telephonic outreach today. Declined need for a mailed/printed copy of the instructions.   The care management team will follow up with Ms. Rodden in two weeks.   [facility] Care Management [phone]

## 2019-11-29 ENCOUNTER — Ambulatory Visit: Payer: Self-pay

## 2019-11-30 ENCOUNTER — Other Ambulatory Visit: Payer: Self-pay

## 2019-11-30 NOTE — Patient Outreach (Signed)
[place] [facility]) Care Management  11/30/2019  Neneh Adami [facility] 07-Jul-1951 [id number]   Medication Adherence call to Mrs. Culver Compliant Voice message left with a call back number. Mrs. Martis is showing past due on Metformin 500 mg under [place].   [place] Management Direct Dial [phone]  Fax [phone] [email or web address]

## 2019-12-02 ENCOUNTER — Other Ambulatory Visit: Payer: Self-pay | Admitting: Family Medicine

## 2019-12-02 NOTE — Telephone Encounter (Signed)
Requested medication (s) are due for refill today- yes  Requested medication (s) are on the active medication list -yes  Future visit scheduled -no  Last refill: 07/20/19  Notes to clinic: Patient is requesting refill for medications written by ED   Requested Prescriptions  Pending Prescriptions Disp Refills   benztropine (COGENTIN) 1 MG tablet 120 tablet 2    Sig: Take 1-2 tablets (1-2 mg total) by mouth 2 (two) times daily. One tablet (1mg ) in the morning and two tablets (2mg ) at bedtime      Neurology: Parkinsonian Agents - benztropine mesylate Passed - 12/02/2019 10:58 AM      Passed - Last Heart Rate in normal range    Pulse Readings from Last 1 Encounters:  09/18/19 98          Passed - Valid encounter within last 12 months    Recent Outpatient Visits           1 month ago Paranoid schizophrenia [facility])   [facility] Steele Sizer, MD   2 months ago Paranoid schizophrenia [facility])   [facility] Steele Sizer, MD   5 months ago Hypokalemia   [facility] Steele Sizer, MD   7 months ago SOB (shortness of breath)   [place], FNP   8 months ago SOB (shortness of breath)   [facility] Steele Sizer, MD       Future Appointments             In 4 days Steele Sizer, MD [facility], [facility]              traZODone (DESYREL) 50 MG tablet 30 tablet 3    Sig: Take 1 tablet (50 mg total) by mouth at bedtime.      Psychiatry: Antidepressants - Serotonin Modulator Passed - 12/02/2019 10:58 AM      Passed - Valid encounter within last 6 months    Recent Outpatient Visits           1 month ago Paranoid schizophrenia [facility])   [facility] Steele Sizer, MD   2 months ago Paranoid schizophrenia [facility])   [facility] Steele Sizer, MD   5 months ago Hypokalemia   [place] Steele Sizer, MD   7 months ago SOB (shortness of breath)   [place], FNP   8 months ago SOB (shortness of breath)   [facility] Steele Sizer, MD       Future Appointments             In 4 days Steele Sizer, MD [facility], [facility]                Requested Prescriptions  Pending Prescriptions Disp Refills   benztropine (COGENTIN) 1 MG tablet 120 tablet 2    Sig: Take 1-2 tablets (1-2 mg total) by mouth 2 (two) times daily. One tablet (1mg ) in the morning and two tablets (2mg ) at bedtime      Neurology: Parkinsonian Agents - benztropine mesylate Passed - 12/02/2019 10:58 AM      Passed - Last Heart Rate in normal range    Pulse Readings from Last 1 Encounters:  09/18/19 98          Passed - Valid encounter within last 12 months    Recent Outpatient Visits  1 month ago Paranoid schizophrenia [facility])   [facility] Steele Sizer, MD   2 months ago Paranoid schizophrenia [facility])   [facility] Steele Sizer, MD   5 months ago Hypokalemia   [facility] Steele Sizer, MD   7 months ago SOB (shortness of breath)   [place], FNP   8 months ago SOB (shortness of breath)   [facility] Steele Sizer, MD       Future Appointments             In 4 days Steele Sizer, MD [facility], [facility]              traZODone (DESYREL) 50 MG tablet 30 tablet 3    Sig: Take 1 tablet (50 mg total) by mouth at bedtime.      Psychiatry: Antidepressants - Serotonin Modulator Passed - 12/02/2019 10:58 AM      Passed - Valid encounter within last 6 months    Recent Outpatient Visits           1 month ago Paranoid schizophrenia [facility])   [facility] Steele Sizer, MD   2 months ago Paranoid schizophrenia [facility])   [facility] Steele Sizer, MD   5 months ago Hypokalemia   [facility] Steele Sizer, MD   7 months ago SOB (shortness of breath)   [place], FNP   8 months ago SOB (shortness of breath)   [facility] Steele Sizer, MD       Future Appointments             In 4 days Steele Sizer, MD [facility], [facility]

## 2019-12-02 NOTE — Telephone Encounter (Signed)
Copied from [name] [id number]. Topic: Quick Communication - Rx Refill/Question >> Dec 02, 2019 10:54 AM [name] wrote: Medication: benztropine (COGENTIN) 1 MG tablet,traZODone (DESYREL) 50 MG tablet (Patient stated that pharmacy has tried reaching out multiple times to get medication sent back over.)  Has the patient contacted their pharmacy? Yes (Agent: If no, request that the patient contact the pharmacy for the refill.) (Agent: If yes, when and what did the pharmacy advise?)Contact PCP  Preferred Pharmacy (with phone number or street name):  [place], [place]  Phone:  [phone] Fax:  [phone]    Agent: Please be advised that RX refills may take up to 3 business days. We ask that you follow-up with your pharmacy.

## 2019-12-06 ENCOUNTER — Other Ambulatory Visit: Payer: Self-pay

## 2019-12-06 ENCOUNTER — Encounter: Payer: Self-pay | Admitting: Family Medicine

## 2019-12-06 ENCOUNTER — Ambulatory Visit (INDEPENDENT_AMBULATORY_CARE_PROVIDER_SITE_OTHER): Payer: Medicare Other | Admitting: Family Medicine

## 2019-12-06 VITALS — BP 126/84 | HR 105 | Temp 97.1°F | Resp 16 | Ht 64.0 in | Wt 206.5 lb

## 2019-12-06 DIAGNOSIS — E669 Obesity, unspecified: Secondary | ICD-10-CM | POA: Diagnosis not present

## 2019-12-06 DIAGNOSIS — I10 Essential (primary) hypertension: Secondary | ICD-10-CM | POA: Diagnosis not present

## 2019-12-06 DIAGNOSIS — F200 Paranoid schizophrenia: Secondary | ICD-10-CM

## 2019-12-06 DIAGNOSIS — M25511 Pain in right shoulder: Secondary | ICD-10-CM

## 2019-12-06 DIAGNOSIS — E876 Hypokalemia: Secondary | ICD-10-CM

## 2019-12-06 DIAGNOSIS — M25512 Pain in left shoulder: Secondary | ICD-10-CM

## 2019-12-06 DIAGNOSIS — R569 Unspecified convulsions: Secondary | ICD-10-CM

## 2019-12-06 DIAGNOSIS — D329 Benign neoplasm of meninges, unspecified: Secondary | ICD-10-CM

## 2019-12-06 DIAGNOSIS — I700 Atherosclerosis of aorta: Secondary | ICD-10-CM

## 2019-12-06 DIAGNOSIS — G8929 Other chronic pain: Secondary | ICD-10-CM

## 2019-12-06 DIAGNOSIS — E1169 Type 2 diabetes mellitus with other specified complication: Secondary | ICD-10-CM | POA: Diagnosis not present

## 2019-12-06 DIAGNOSIS — Z79899 Other long term (current) drug therapy: Secondary | ICD-10-CM

## 2019-12-06 LAB — POCT GLYCOSYLATED HEMOGLOBIN (HGB A1C): Hemoglobin A1C: 5.7 % — AB (ref 4.0–5.6)

## 2019-12-06 NOTE — Chronic Care Management (AMB) (Signed)
   Care Coordination   Name: Calvina Weddington MRN: [id number] DOB: 26-Oct-1950    Attempted outreach with [facility] staff regarding medication assistance and administration of Haldol injections for Ms. [place] reports that [facility] was providing this service in the past. She is interested in continuing outreach if the service is available in [place].  Left a voice message with the agency requesting a return call.   Follow up plan: The care management team will reach out again next week.   [place] [facility] [phone]

## 2019-12-06 NOTE — Progress Notes (Signed)
Name: Lauren [name]   MRN: [id number]    DOB: [birth year]   Date:12/06/2019       Progress Note  Subjective  Chief Complaint  Chief Complaint  Patient presents with  . Diabetes  . Tremors    She has a tremor in her left hand that she is concerned about.  . Medication Refill    She needs to contact Psychiatrist about her medications.  . Chronic Care Management    She may need some assistance with meals. Maybe meals on wheels.    HPI  She came in with her cousin: Lauren [name] - lives with her in [place] -  Medication Management: she gets medication on a bubble pack but not sure of what she is taking. She thinks she is out of Metformin  Tremors: she asked about Parkinson's disease, explained that she has seen Neurologist in the past and there is not diagnosis of parkinson's. Tremors not present with rest. Difficulty writing.   Schizophrenia: living with her cousin and seems to be doing well  Bilateral shoulder pain: going on for months - not sure how long, right worse than left side, difficulty abduction shoulder. She takes otc medication sometimes, like lidocaine cream it seems to help. Pain is described as 6/10, described as sharp ,   Morbid obesity: she has BMI above 35 with co-morbidities, such as HTN and DM. Discussed healthier diet   Patient Active Problem List   Diagnosis Date Noted  . 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 SS-A antibody positive 04/15/2019  . AKI (acute kidney injury) ([place]) 04/06/2019  . Slurred speech 03/04/2019  . Chest pain in adult 02/15/2019  . Acute respiratory failure ([place]) 02/15/2019  . Seizure-like activity ([place]) 12/29/2018  . Seizures ([place]) 11/24/2018  . Paresthesias 11/22/2018  . Mouth dryness 10/12/2018  . Hx of resection of meningioma 07/21/2018  . Tremor 07/21/2018  . Schizoaffective disorder ([place]) 12/29/2017  . Morbid obesity ([place]) 12/09/2017  . Meningioma ([place]) 11/25/2017  . Atherosclerosis of aorta ([place]) 11/25/2017  .  Calcification of coronary artery 11/25/2017  . Schizophrenia ([place]) 11/04/2017  . Acid reflux 06/29/2015  . Diabetes mellitus type 2, controlled, without complications ([place]) [id number]  . Cardiac murmur 06/29/2015  . Arthritis of knee, degenerative 06/28/2015  . Insomnia w/ sleep apnea 03/21/2015  . Anxiety disorder 03/21/2015  . Hypertension 03/21/2015  . HLD (hyperlipidemia) 03/21/2015  . Urge incontinence 03/21/2015    Past Surgical History:  Procedure Laterality Date  . BRAIN TUMOR EXCISION  2012   benign  . CARDIAC CATHETERIZATION  2008  . CESAREAN SECTION    . CYST REMOVAL HAND    . LUNG LOBECTOMY  1977   benign tumor  . TOTAL KNEE ARTHROPLASTY Left 01/28/2017   Procedure: TOTAL KNEE ARTHROPLASTY;  Surgeon: Corky Mull, MD;  Location: [facility] ORS;  Service: Orthopedics;  Laterality: Left;    Family History  Problem Relation Age of Onset  . Heart disease Brother   . Depression Mother   . Heart attack Mother   . Stroke Mother   . Alcohol abuse Father   . Stroke Father   . Diabetes Sister   . Diabetes Sister   . Stomach cancer Sister   . Kidney disease Sister   . COPD Brother   . Lung cancer Brother   . Diabetes Brother     Social History   Tobacco Use  . Smoking status: Never Smoker  . Smokeless tobacco: Never Used  Substance Use Topics  .  Alcohol use: No    Alcohol/week: 0.0 standard drinks     Current Outpatient Medications:  .  aspirin EC 81 MG tablet, Take 1 tablet (81 mg total) by mouth daily before breakfast., Disp: 30 tablet, Rfl: 5 .  atorvastatin (LIPITOR) 40 MG tablet, Take 1 tablet (40 mg total) by mouth daily at 6 PM., Disp: 30 tablet, Rfl: 1 .  escitalopram (LEXAPRO) 10 MG tablet, Take 1 tablet (10 mg total) by mouth daily., Disp: 90 tablet, Rfl: 1 .  gabapentin (NEURONTIN) 100 MG capsule, Take 1 capsule (100 mg total) by mouth 2 (two) times daily., Disp: 180 capsule, Rfl: 1 .  haloperidol decanoate (HALDOL DECANOATE) 100 MG/ML injection,  Inject 100 mg into the muscle every 28 (twenty-eight) days. , Disp: , Rfl:  .  hydrochlorothiazide (HYDRODIURIL) 12.5 MG tablet, Take 1 tablet (12.5 mg total) by mouth daily. New dose, stop hctz 25 mg, Disp: 30 tablet, Rfl: 1 .  levETIRAcetam (KEPPRA) 500 MG tablet, Take 1 tablet (500 mg total) by mouth 2 (two) times daily., Disp: 60 tablet, Rfl: 1 .  lisinopril (ZESTRIL) 20 MG tablet, Take 1 tablet (20 mg total) by mouth daily., Disp: 30 tablet, Rfl: 5 .  potassium chloride (KLOR-CON) 10 MEQ tablet, Take 1 tablet (10 mEq total) by mouth daily., Disp: 30 tablet, Rfl: 5 .  traZODone (DESYREL) 50 MG tablet, Take 1 tablet (50 mg total) by mouth at bedtime., Disp: 30 tablet, Rfl: 3 .  benztropine (COGENTIN) 1 MG tablet, Take 1-2 tablets (1-2 mg total) by mouth 2 (two) times daily. One tablet (1mg ) in the morning and two tablets (2mg ) at bedtime, Disp: 120 tablet, Rfl: 2  Allergies  Allergen Reactions  . Percocet [Oxycodone-Acetaminophen] Diarrhea, Nausea And Vomiting and Nausea Only  . Tramadol Hcl Diarrhea, Nausea And Vomiting and Nausea Only  . Vicodin [Hydrocodone-Acetaminophen] Diarrhea, Nausea And Vomiting and Nausea Only    I personally reviewed active problem list, medication list, allergies, family history, social history, health maintenance with the patient/caregiver today.   ROS  Constitutional: Negative for fever or weight change.  Respiratory: Negative for cough , positive for shallow breathing but denies sob  Cardiovascular: Negative for chest pain or palpitations.  Gastrointestinal: Negative for abdominal pain, no bowel changes.  Musculoskeletal: Positive for gait problem , no  joint swelling.  Skin: Positive  for rash on face .  Neurological: Negative for dizziness or headache.  No other specific complaints in a complete review of systems (except as listed in HPI above).  Objective  Vitals:   12/06/19 1430  BP: 126/84  Pulse: (!) 105  Resp: 16  Temp: (!) 97.1 F (36.2  C)  TempSrc: Temporal  SpO2: 97%  Weight: 206 lb 8 oz (93.7 kg)  Height: 5\' 4"  (1.626 m)    Body mass index is 35.45 kg/m.  Physical Exam  Constitutional: Patient appears well-developed and well-nourished. Obese  No distress.  HEENT: head atraumatic, normocephalic, pupils equal and reactive to light Cardiovascular: Normal rate, regular rhythm and normal heart sounds.  No murmur heard. No BLE edema. Pulmonary/Chest: Effort normal and breath sounds normal. She has very irregular and shallow breaths, stable if not improved since last visit  Abdominal: Soft.  There is no tenderness. Skin: seborrhea nose  Psychiatric: Patient has a normal mood and affect. behavior is normal. Judgment and thought content normal.  Recent Results (from the past 2160 hour(s))  Basic metabolic panel     Status: Abnormal   Collection Time: 09/16/19  3:27 PM  Result Value Ref Range   Sodium 141 135 - 145 mmol/L   Potassium 3.4 (L) 3.5 - 5.1 mmol/L   Chloride 107 98 - 111 mmol/L   CO2 25 22 - 32 mmol/L   Glucose, Bld 101 (H) 70 - 99 mg/dL   BUN 15 8 - 23 mg/dL   Creatinine, Ser 0.80 0.44 - 1.00 mg/dL   Calcium 9.5 8.9 - 10.3 mg/dL   GFR calc non Af Amer >60 >60 mL/min   GFR calc Af Amer >60 >60 mL/min   Anion gap 9 5 - 15    Comment: Performed at [facility], [address] [place], [place] [address]  CBC     Status: Abnormal   Collection Time: 09/16/19  3:27 PM  Result Value Ref Range   WBC 6.3 4.0 - 10.5 K/uL   RBC 3.64 (L) 3.87 - 5.11 MIL/uL   Hemoglobin 11.3 (L) 12.0 - 15.0 g/dL   HCT 34.1 (L) 36.0 - 46.0 %   MCV 93.7 80.0 - 100.0 fL   MCH 31.0 26.0 - 34.0 pg   MCHC 33.1 30.0 - 36.0 g/dL   RDW 14.9 11.5 - 15.5 %   Platelets 266 150 - 400 K/uL   nRBC 0.0 0.0 - 0.2 %    Comment: Performed at [facility], [address] [place], [place] [address]  Troponin I (High Sensitivity)     Status: None   Collection Time: 09/16/19  3:27 PM  Result Value Ref Range   Troponin I  (High Sensitivity) <2 <18 ng/L    Comment: (NOTE) Elevated high sensitivity troponin I (hsTnI) values and significant  changes across serial measurements may suggest ACS but many other  chronic and acute conditions are known to elevate hsTnI results.  Refer to the "Links" section for chest pain algorithms and additional  guidance. Performed at [facility], [place], [place] [address]   Troponin I (High Sensitivity)     Status: None   Collection Time: 09/16/19  5:25 PM  Result Value Ref Range   Troponin I (High Sensitivity) <2 <18 ng/L    Comment: (NOTE) Elevated high sensitivity troponin I (hsTnI) values and significant  changes across serial measurements may suggest ACS but many other  chronic and acute conditions are known to elevate hsTnI results.  Refer to the "Links" section for chest pain algorithms and additional  guidance. Performed at [facility], [address] [place], [place] [address]   Ethanol     Status: None   Collection Time: 09/16/19  6:00 PM  Result Value Ref Range   Alcohol, Ethyl (B) <10 <10 mg/dL    Comment: (NOTE) Lowest detectable limit for serum alcohol is 10 mg/dL. For medical purposes only. Performed at [facility], [address] [place], [place] [address]   Glucose, capillary     Status: None   Collection Time: 09/17/19  6:09 PM  Result Value Ref Range   Glucose-Capillary 94 70 - 99 mg/dL  POCT HgB A1C     Status: Abnormal   Collection Time: 12/06/19  2:47 PM  Result Value Ref Range   Hemoglobin A1C 5.7 (A) 4.0 - 5.6 %   HbA1c POC (<> result, manual entry)     HbA1c, POC (prediabetic range)     HbA1c, POC (controlled diabetic range)        PHQ2/9: Depression screen [facility] 2/9 12/06/2019 11/24/2019 10/26/2019 09/21/2019 06/07/2019  Decreased Interest 0  0 0 1 0  Down, Depressed, Hopeless 0 0 0 1 0  PHQ - 2 Score 0 0 0 2 0  Altered sleeping 0 - 2 2 0  Tired, decreased energy 0 - 1 2 0  Change in  appetite 0 - 1 1 0  Feeling bad or failure about yourself  0 - 0 3 0  Trouble concentrating 0 - 2 2 0  Moving slowly or fidgety/restless 0 - 1 1 0  Suicidal thoughts 0 - 0 0 0  PHQ-9 Score 0 - 7 13 0  Difficult doing work/chores - - Somewhat difficult Somewhat difficult Not difficult at all  Some recent data might be hidden    Phq 9 :negative    Fall Risk: Fall Risk  11/24/2019 10/26/2019 10/08/2019 09/21/2019 06/07/2019  Falls in the past year? 0 0 0 0 0  Number falls in past yr: - 0 0 0 0  Injury with Fall? - 0 0 0 0  Risk for fall due to : Impaired balance/gait;Medication side effect No Fall Risks - - -  Follow up Falls prevention discussed Falls prevention discussed - - -    Assessment & Plan   1. Diabetes mellitus type 2 in obese (HCC)  - POCT HgB A1C May stop metformin   2. Hypokalemia  Recheck labs - COMPLETE METABOLIC PANEL WITH GFR  3. Essential hypertension  At goal  - CBC with Differential/Platelet - COMPLETE METABOLIC PANEL WITH GFR  4. Paranoid schizophrenia ([place])  She needs to follow up with psychiatrist, last haldol was March 16th, 2021  5. Meningioma [facility])  Sees neurologist   6. Atherosclerosis of aorta ([place])  On statin therapy   7. Hypomagnesemia  Recheck level  8. Seizure-like activity (HCC)  No recent activity   9. Chronic pain of both shoulders  - Ambulatory referral to [place]. Referred for medication therapy management  - Ambulatory referral to [place]. Morbid obesity ([place])  Discussed with the patient the risk posed by an increased BMI. Discussed importance of portion control, calorie counting and at least 150 minutes of physical activity weekly. Avoid sweet beverages and drink more water. Eat at least 6 servings of fruit and vegetables daily

## 2019-12-07 LAB — COMPLETE METABOLIC PANEL WITH GFR
AG Ratio: 1.3 (calc) (ref 1.0–2.5)
ALT: 15 U/L (ref 6–29)
AST: 17 U/L (ref 10–35)
Albumin: 4 g/dL (ref 3.6–5.1)
Alkaline phosphatase (APISO): 57 U/L (ref 37–153)
BUN: 16 mg/dL (ref 7–25)
CO2: 26 mmol/L (ref 20–32)
Calcium: 9.7 mg/dL (ref 8.6–10.4)
Chloride: 105 mmol/L (ref 98–110)
Creat: 0.7 mg/dL (ref 0.50–0.99)
GFR, Est African American: 105 mL/min/{1.73_m2} (ref >=60)
GFR, Est Non African American: 90 mL/min/{1.73_m2} (ref >=60)
Globulin: 3.1 g/dL (calc) (ref 1.9–3.7)
Glucose, Bld: 103 mg/dL — ABNORMAL HIGH (ref 65–99)
Potassium: 4.2 mmol/L (ref 3.5–5.3)
Sodium: 138 mmol/L (ref 135–146)
Total Bilirubin: 0.5 mg/dL (ref 0.2–1.2)
Total Protein: 7.1 g/dL (ref 6.1–8.1)

## 2019-12-07 LAB — CBC WITH DIFFERENTIAL/PLATELET
Absolute Monocytes: 502 cells/uL (ref 200–950)
Basophils Absolute: 68 cells/uL (ref 0–200)
Basophils Relative: 1.1 %
Eosinophils Absolute: 93 cells/uL (ref 15–500)
Eosinophils Relative: 1.5 %
HCT: 37.6 % (ref 35.0–45.0)
Hemoglobin: 12.5 g/dL (ref 11.7–15.5)
Lymphs Abs: 2592 cells/uL (ref 850–3900)
MCH: 32.1 pg (ref 27.0–33.0)
MCHC: 33.2 g/dL (ref 32.0–36.0)
MCV: 96.4 fL (ref 80.0–100.0)
MPV: 11.7 fL (ref 7.5–12.5)
Monocytes Relative: 8.1 %
Neutro Abs: 2945 cells/uL (ref 1500–7800)
Neutrophils Relative %: 47.5 %
Platelets: 231 10*3/uL (ref 140–400)
RBC: 3.9 10*6/uL (ref 3.80–5.10)
RDW: 13.2 % (ref 11.0–15.0)
Total Lymphocyte: 41.8 %
WBC: 6.2 10*3/uL (ref 3.8–10.8)

## 2019-12-07 LAB — MAGNESIUM: Magnesium: 1.9 mg/dL (ref 1.5–2.5)

## 2019-12-08 ENCOUNTER — Telehealth: Payer: Self-pay

## 2019-12-17 DIAGNOSIS — R05 Cough: Secondary | ICD-10-CM | POA: Diagnosis not present

## 2019-12-17 DIAGNOSIS — J310 Chronic rhinitis: Secondary | ICD-10-CM | POA: Diagnosis not present

## 2019-12-19 DIAGNOSIS — R05 Cough: Secondary | ICD-10-CM | POA: Diagnosis not present

## 2019-12-20 DIAGNOSIS — R05 Cough: Secondary | ICD-10-CM | POA: Diagnosis not present

## 2019-12-22 ENCOUNTER — Ambulatory Visit: Payer: Medicare Other | Attending: Internal Medicine

## 2019-12-22 DIAGNOSIS — Z23 Encounter for immunization: Secondary | ICD-10-CM

## 2019-12-22 NOTE — Progress Notes (Signed)
   Covid-19 Vaccination Clinic  Name:  Lauren [name]    MRN: [id number] DOB: [birth year]  12/22/2019  [name] was observed post Covid-19 immunization for 15 minutes without incident. She was provided with Vaccine Information Sheet and instruction to access the V-Safe system.   [name] was instructed to call 911 with any severe reactions post vaccine: [name] Difficulty breathing  . Swelling of face and throat  . A fast heartbeat  . A bad rash all over body  . Dizziness and weakness   Immunizations Administered    Name Date Dose VIS Date Route   Moderna COVID-19 Vaccine 12/22/2019 10:40 AM 0.5 mL 08/10/2019 Intramuscular   Manufacturer: Moderna   Lot: [id number]   [id number]

## 2019-12-29 ENCOUNTER — Other Ambulatory Visit: Payer: Self-pay | Admitting: Family Medicine

## 2019-12-29 ENCOUNTER — Ambulatory Visit (INDEPENDENT_AMBULATORY_CARE_PROVIDER_SITE_OTHER): Payer: Medicare Other

## 2019-12-29 DIAGNOSIS — I10 Essential (primary) hypertension: Secondary | ICD-10-CM

## 2019-12-29 DIAGNOSIS — E1169 Type 2 diabetes mellitus with other specified complication: Secondary | ICD-10-CM

## 2019-12-29 DIAGNOSIS — E669 Obesity, unspecified: Secondary | ICD-10-CM | POA: Diagnosis not present

## 2019-12-30 ENCOUNTER — Telehealth: Payer: Self-pay | Admitting: Family Medicine

## 2020-01-03 DIAGNOSIS — R251 Tremor, unspecified: Secondary | ICD-10-CM | POA: Diagnosis not present

## 2020-01-03 DIAGNOSIS — R682 Dry mouth, unspecified: Secondary | ICD-10-CM | POA: Diagnosis not present

## 2020-01-03 DIAGNOSIS — R569 Unspecified convulsions: Secondary | ICD-10-CM | POA: Diagnosis not present

## 2020-01-05 NOTE — Chronic Care Management (AMB) (Signed)
Chronic Care Management   Follow Up Note    Name: Asharee Gabler MRN: [id number] DOB: 1951/01/01  Primary Care Provider: Steele Sizer, MD Reason for referral : Chronic Care Management   Vayda Awbree Hoffer is a 69 y.o. year old female who is a primary care patient of Steele Sizer, MD. She is currently engaged with the chronic care management team. A routine telephonic outreach was conducted today.   Review of Ms. Accardi's status, including review of consultants reports, relevant labs and test results was conducted today. Collaboration with appropriate care team members was performed as part of the comprehensive evaluation and provision of chronic care management services.    Outpatient Encounter Medications as of 12/29/2019  Medication Sig Note  . aspirin EC 81 MG tablet Take 1 tablet (81 mg total) by mouth daily before breakfast.   . benztropine (COGENTIN) 1 MG tablet Take 1-2 tablets (1-2 mg total) by mouth 2 (two) times daily. One tablet (1mg ) in the morning and two tablets (2mg ) at bedtime   . escitalopram (LEXAPRO) 10 MG tablet Take 1 tablet (10 mg total) by mouth daily.   [name] gabapentin (NEURONTIN) 100 MG capsule Take 1 capsule (100 mg total) by mouth 2 (two) times daily.   . haloperidol decanoate (HALDOL DECANOATE) 100 MG/ML injection Inject 100 mg into the muscle every 28 (twenty-eight) days.  09/18/2019: Filled 08/30/2019  . levETIRAcetam (KEPPRA) 500 MG tablet Take 1 tablet (500 mg total) by mouth 2 (two) times daily.   [name] lisinopril (ZESTRIL) 20 MG tablet Take 1 tablet (20 mg total) by mouth daily.   . potassium chloride (KLOR-CON) 10 MEQ tablet Take 1 tablet (10 mEq total) by mouth daily.   . traZODone (DESYREL) 50 MG tablet Take 1 tablet (50 mg total) by mouth at bedtime.   . [DISCONTINUED] atorvastatin (LIPITOR) 40 MG tablet Take 1 tablet (40 mg total) by mouth daily at 6 PM.   . [DISCONTINUED] hydrochlorothiazide (HYDRODIURIL) 12.5 MG tablet Take 1 tablet  (12.5 mg total) by mouth daily. New dose, stop hctz 25 mg    No facility-administered encounter medications on file as of 12/29/2019.       Goals Addressed            This Visit's Progress   . Chronic Disease Management       CARE PLAN ENTRY (see longtitudinal plan of care for additional care plan information)  Current Barriers:  . Chronic Disease Management support and education needs related to Hypertension, Diabetes, Hyperlipidemia.  Case Manager Clinical Goal(s):  [name] Over the next 90 days, patient will be hospitalized due to chronic disease related complications. . Over the next 90 days, patient will attend all medical appointments as scheduled. . Over the next 90 days, patient will take all medications as prescribed. . Over the next 90 days, patient will monitor blood pressure and record readings. . Over the next 90 days, patient will adhere to recommended cardiac prudent/diabetic diet. . Over the next 90 days, patient will follow recommended safety measures to prevent falls. . Over the next 30 days, patient will obtain glucometer and supplies if approved by MD and monitor blood glucose levels daily.   Interventions:  . Provided education regarding medications and indications for use. Advised to take all medications as prescribed. Encouraged to notify MD with concerns regarding prescribed regimen. Encouraged to notify care management team with concerns regarding medication management or prescription costs. Denies current concerns regarding medication management. Reports receiving Haldol injection earlier this  month but unsure if she needs to continue or follow up with Psychiatrist. Will follow up to confirm. . Discussed blood pressure parameters and indications for notifying MD. Encouraged to monitor daily and maintain a log (with assistance from caregiver). . Discussed nutritional intake. Encouraged to adhere to recommended cardiac prudent/diabetic diet. Reports good nutritional  intake. Declines current need for nutritional assistance. . Provided education regarding safety and fall prevention measures. Encouraged to change positions slowly and use assistive device when ambulating. Encouraged to request assistance when needed and avoid activities that cause overexertion. Reports falling within the last week due to stepping off of a paved surface. Reports family members assisted her up. Denies injuries related to the fall. Emergency services was not contacted. . Reviewed pending provider appointments. Encouraged to attend scheduled appointments to prevent delays in care. Denies current transportation needs. Will follow-up to determine is new referral for Psychiatry is needed.   Patient Self Care Activities:  . Unable to perform ADLs independently . Unable to perform IADLs independently   Please see past updates related to this goal by clicking on the "Past Updates" button in the selected goal          PLAN The care management team will follow up within the next two weeks.    [facility] [phone]

## 2020-01-05 NOTE — Patient Instructions (Addendum)
Thank you for allowing the Chronic Care Management team to participate in your care.  Goals Addressed            This Visit's Progress   . Chronic Disease Management       CARE PLAN ENTRY (see longtitudinal plan of care for additional care plan information)  Current Barriers:  . Chronic Disease Management support and education needs related to Hypertension, Diabetes, Hyperlipidemia.  Case Manager Clinical Goal(s):  [name] Over the next 90 days, patient will be hospitalized due to chronic disease related complications. . Over the next 90 days, patient will attend all medical appointments as scheduled. . Over the next 90 days, patient will take all medications as prescribed. . Over the next 90 days, patient will monitor blood pressure and record readings. . Over the next 90 days, patient will adhere to recommended cardiac prudent/diabetic diet. . Over the next 90 days, patient will follow recommended safety measures to prevent falls. . Over the next 30 days, patient will obtain glucometer and supplies if approved by MD and monitor blood glucose levels daily.   Interventions:  . Provided education regarding medications and indications for use. Advised to take all medications as prescribed. Encouraged to notify MD with concerns regarding prescribed regimen. Encouraged to notify care management team with concerns regarding medication management or prescription costs. Denies current concerns regarding medication management. Reports receiving Haldol injection earlier this month but unsure if she needs to continue or follow up with Psychiatrist. Will follow up to confirm. . Discussed blood pressure parameters and indications for notifying MD. Encouraged to monitor daily and maintain a log (with assistance from caregiver). . Discussed nutritional intake. Encouraged to adhere to recommended cardiac prudent/diabetic diet. Reports good nutritional intake. Declines current need for nutritional  assistance. . Provided education regarding safety and fall prevention measures. Encouraged to change positions slowly and use assistive device when ambulating. Encouraged to request assistance when needed and avoid activities that cause overexertion. Reports falling within the last week due to stepping off of a paved surface. Reports family members assisted her up. Denies injuries related to the fall. Emergency services was not contacted. . Reviewed pending provider appointments. Encouraged to attend scheduled appointments to prevent delays in care. Denies current transportation needs. Will follow-up to determine is new referral for Psychiatry is needed.   Patient Self Care Activities:  . Unable to perform ADLs independently . Unable to perform IADLs independently   Please see past updates related to this goal by clicking on the "Past Updates" button in the selected goal          [name] verbalized understanding of the instructions provided during the telephonic outreach today. Declined need for a mailed/printed copy of the instructions.    The care management team will follow up within the next two weeks.    [facility] [phone]

## 2020-01-06 ENCOUNTER — Telehealth: Payer: Self-pay

## 2020-01-06 NOTE — Telephone Encounter (Signed)
Pt asked for refill for levETIRAcetam (KEPPRA) 500 MG tablet /please advise

## 2020-01-06 NOTE — Telephone Encounter (Signed)
This came to the wrong office Thank you  Copied from [place] [phone]. Topic: General - Inquiry >> Jan 06, 2020  3:14 PM Percell Belt A wrote: Reason for CRM: pt called in and stated  levETIRAcetam (KEPPRA) 500 MG tablet [id number]  she stated that this med is making her urinate a lot. She would like a call back from the nurse about this med

## 2020-01-06 NOTE — Telephone Encounter (Signed)
Pt called about missing a call from the office/ please advise

## 2020-01-07 NOTE — Telephone Encounter (Signed)
Called patient. No answer LVM about seeing psychiatrist and neurologist for refills on her Keppra, an emergency supply was sent in from [facility].

## 2020-01-10 ENCOUNTER — Other Ambulatory Visit: Payer: Self-pay | Admitting: Family Medicine

## 2020-01-10 ENCOUNTER — Ambulatory Visit: Payer: Self-pay

## 2020-01-10 DIAGNOSIS — F200 Paranoid schizophrenia: Secondary | ICD-10-CM

## 2020-01-10 NOTE — Telephone Encounter (Signed)
Left a message patient need to ask for refill on Keppra from Neurologist.

## 2020-01-13 DIAGNOSIS — R569 Unspecified convulsions: Secondary | ICD-10-CM | POA: Diagnosis not present

## 2020-01-17 ENCOUNTER — Ambulatory Visit: Payer: Self-pay

## 2020-01-18 DIAGNOSIS — J310 Chronic rhinitis: Secondary | ICD-10-CM | POA: Diagnosis not present

## 2020-01-18 DIAGNOSIS — R05 Cough: Secondary | ICD-10-CM | POA: Diagnosis not present

## 2020-01-21 ENCOUNTER — Telehealth: Payer: Self-pay

## 2020-01-21 ENCOUNTER — Ambulatory Visit: Payer: Self-pay

## 2020-01-21 NOTE — Chronic Care Management (AMB) (Signed)
     01/21/2020 Name: Lauren [name] MRN: [id number] DOB: 05/01/1951   Contacted [facility] regarding status of [name]'s referral. Per staff the referral was received on 01/10/20 and reviewed. Indicated that [name] was previously being followed by Dr. Shea Evans.  Care was discontinued due to noncompliance. The referral will not be processed to reestablish care.   Follow up plan: Will follow up with [name] to discuss options and update PCP.   [facility] [phone]

## 2020-01-21 NOTE — Chronic Care Management (AMB) (Signed)
  Chronic Care Management   Outreach Note  01/21/2020 Name: Lauren [name] MRN: [id number] DOB: 01-Feb-1951  Primary Care Provider: Steele Sizer, MD Reason for referral : Chronic Care Management   Attempted follow-up outreach with [name] to discuss preference/options for  Psychiatric Care.  Left a HIPAA compliant voice message requesting a return call.     Follow Up Plan -Will attempt to contact [name] again on 01/24/20 and update PCP with preference for referral.    [facility] [phone]

## 2020-01-24 ENCOUNTER — Ambulatory Visit: Payer: Self-pay

## 2020-01-28 ENCOUNTER — Telehealth: Payer: Self-pay | Admitting: Family Medicine

## 2020-01-31 NOTE — Telephone Encounter (Signed)
Pt called wanting to know the status of her prescriptions being filled.  She will be completely out of the meds in 2 days.  She also said Bonnita Nasuti said she needed a referral to a psy doctor but in the meant time she said she needed a  refill   CB# [phone]

## 2020-02-01 ENCOUNTER — Other Ambulatory Visit: Payer: Self-pay | Admitting: Family Medicine

## 2020-02-01 DIAGNOSIS — R569 Unspecified convulsions: Secondary | ICD-10-CM | POA: Diagnosis not present

## 2020-02-02 DIAGNOSIS — G2401 Drug induced subacute dyskinesia: Secondary | ICD-10-CM | POA: Diagnosis not present

## 2020-02-02 NOTE — Telephone Encounter (Signed)
Patient stated she just found out today who would be subscribing her medications her name is Olen Pel NP. Patient was notified to go to [facility]

## 2020-02-02 NOTE — Telephone Encounter (Signed)
Are you willing to refill her medication

## 2020-02-02 NOTE — Chronic Care Management (AMB) (Signed)
  Chronic Care Management   Note   Name: Lauren [name] MRN: [id number] DOB: 1951-06-03   Updated Ms. Speigner and caregiver regarding declined Psychiatry referral. They agreed to discuss options for available providers and update the team with their preference.    Follow up plan: Will follow-up within the next two to three weeks and update PCP with preference for next referral.   [facility] [phone]

## 2020-02-14 ENCOUNTER — Telehealth: Payer: Self-pay

## 2020-02-14 ENCOUNTER — Ambulatory Visit: Payer: Self-pay

## 2020-02-14 NOTE — Chronic Care Management (AMB) (Signed)
  Chronic Care Management   Outreach Note  02/14/2020 Name: Lauren [name] MRN: [id number] DOB: 1951/05/30  Primary Care Provider: Steele Sizer, MD Reason for referral : Chronic Care Management   An unsuccessful telephone outreach was attempted today. Lauren [name] is currently engaged with the chronic care management team.  A HIPAA compliant voice message was left today requesting a return call.    Follow Up Plan The care management team will reach out again within the next week.   [facility] [phone]

## 2020-02-15 DIAGNOSIS — Z79899 Other long term (current) drug therapy: Secondary | ICD-10-CM | POA: Diagnosis not present

## 2020-02-15 DIAGNOSIS — R569 Unspecified convulsions: Secondary | ICD-10-CM | POA: Diagnosis not present

## 2020-02-18 ENCOUNTER — Other Ambulatory Visit: Payer: Self-pay | Admitting: Neurosurgery

## 2020-02-18 DIAGNOSIS — D329 Benign neoplasm of meninges, unspecified: Secondary | ICD-10-CM

## 2020-02-21 ENCOUNTER — Ambulatory Visit: Payer: Self-pay

## 2020-02-21 ENCOUNTER — Telehealth: Payer: Self-pay | Admitting: Radiology

## 2020-02-21 DIAGNOSIS — I10 Essential (primary) hypertension: Secondary | ICD-10-CM

## 2020-02-21 DIAGNOSIS — F200 Paranoid schizophrenia: Secondary | ICD-10-CM

## 2020-02-21 NOTE — Chronic Care Management (AMB) (Signed)
Chronic Care Management   Follow Up Note   02/21/2020 Name: Lauren [name] MRN: [id number] DOB: 07/22/1951  Primary Care Provider: Steele Sizer, MD Reason for referral : Chronic Care Management   Lauren [name] is a 69 y.o. year old female who is a primary care patient of Steele Sizer, MD. She is currently engaged with the chronic care management team. A routine telephonic outreach was conducted today.  Review of Ms. Vessey's status, including review of consultants reports, relevant labs and test results was conducted today. Collaboration with appropriate care team members was performed as part of the comprehensive evaluation and provision of chronic care management services.     SDOH (Social Determinants of Health) assessments performed: No     Outpatient Encounter Medications as of 02/21/2020  Medication Sig Note  . aspirin EC 81 MG tablet Take 1 tablet (81 mg total) by mouth daily before breakfast.   . atorvastatin (LIPITOR) 40 MG tablet TAKE ONE TABLET BY MOUTH ONCE DAILY AT 6 PM.   . benztropine (COGENTIN) 1 MG tablet Take 1-2 tablets (1-2 mg total) by mouth 2 (two) times daily. One tablet (1mg ) in the morning and two tablets (2mg ) at bedtime   . escitalopram (LEXAPRO) 10 MG tablet Take 1 tablet (10 mg total) by mouth daily.   [name] gabapentin (NEURONTIN) 100 MG capsule Take 1 capsule (100 mg total) by mouth 2 (two) times daily.   . haloperidol decanoate (HALDOL DECANOATE) 100 MG/ML injection Inject 100 mg into the muscle every 28 (twenty-eight) days.  09/18/2019: Filled 08/30/2019  . hydrochlorothiazide (HYDRODIURIL) 12.5 MG tablet TAKE ONE TABLET BY MOUTH ONCE DAILY. STOP TAKING 25 MG.   . levETIRAcetam (KEPPRA) 500 MG tablet Take 1 tablet (500 mg total) by mouth 2 (two) times daily.   [name] lisinopril (ZESTRIL) 20 MG tablet Take 1 tablet (20 mg total) by mouth daily.   . potassium chloride (KLOR-CON) 10 MEQ tablet Take 1 tablet (10 mEq total) by mouth daily.     . traZODone (DESYREL) 50 MG tablet Take 1 tablet (50 mg total) by mouth at bedtime.    No facility-administered encounter medications on file as of 02/21/2020.     Goals Addressed            This Visit's Progress   . Chronic Disease Management       CARE PLAN ENTRY (see longtitudinal plan of care for additional care plan information)  Current Barriers:  . Chronic Disease Management support and education needs related to Hypertension, Diabetes, Hyperlipidemia.  Case Manager Clinical Goal(s):  [name] Over the next 90 days, patient will be hospitalized due to chronic disease related complications. . Over the next 90 days, patient will attend all medical appointments as scheduled. . Over the next 90 days, patient will take all medications as prescribed. . Over the next 90 days, patient will monitor blood pressure and record readings. . Over the next 90 days, patient will adhere to recommended cardiac prudent/diabetic diet. . Over the next 90 days, patient will follow recommended safety measures to prevent falls. . Over the next 30 days, patient will obtain glucometer and supplies if approved by MD and monitor blood glucose levels daily.   Interventions:  . Discussed medications. Reports taking medications as prescribed. Currently receiving compliance packages via [employer]. Confirmed that Olen Pel, NP with [facility] will administer monthly Haldol injections.  . Discussed blood pressure parameters. Reports BP is being measured by [facility] staff and during clinic appts. Reports  readings have been within range. . Reviewed safety and fall prevention measures. Encouraged to continue using assistive devices as needed. Encouraged to update care management team with concerns regarding decrease in activity tolerance. Reports using caution and ambulating well.  . Discussed plan for ongoing care management and follow-up. Declines urgent concerns or changes in care management  needs. Her cousin remains in the home and is available to assist as needed. A referral was previously placed to re-establish services at [facility].Per staff, the referral was declined d/t history of noncompliance with treatment. Discussed options for services. She prefers to receive in-home services with [facility]. Reports monthly Haldol injections are currently being administered by this team. Confirmed pending outreach with Olen Pel, NP.    Patient Self Care Activities:  . Unable to perform ADLs independently . Unable to perform IADLs independently   Please see past updates related to this goal by clicking on the "Past Updates" button in the selected goal          PLAN The care management team will follow-up with Ms. Risk next month.    [facility] [phone]

## 2020-02-21 NOTE — Chronic Care Management (AMB) (Signed)
  Care Coordination Only    Name: Lauren [name] MRN: [id number] DOB: 04-15-1951    [name] will follow up with the care management team regarding possible need for medical supplies after she speaks with her caregiver, [name]. Indicated that [name] will also provide an update regarding her recent Neurology visit.   [facility] [phone]

## 2020-03-06 NOTE — Patient Instructions (Addendum)
Thank you for allowing the Chronic Care Management team to participate in your care.   Goals Addressed            This Visit's Progress   . Chronic Disease Management       CARE PLAN ENTRY (see longtitudinal plan of care for additional care plan information)  Current Barriers:  . Chronic Disease Management support and education needs related to Hypertension, Diabetes, Hyperlipidemia.  Case Manager Clinical Goal(s):  [name] Over the next 90 days, patient will be hospitalized due to chronic disease related complications. . Over the next 90 days, patient will attend all medical appointments as scheduled. . Over the next 90 days, patient will take all medications as prescribed. . Over the next 90 days, patient will monitor blood pressure and record readings. . Over the next 90 days, patient will adhere to recommended cardiac prudent/diabetic diet. . Over the next 90 days, patient will follow recommended safety measures to prevent falls. . Over the next 30 days, patient will obtain glucometer and supplies if approved by MD and monitor blood glucose levels daily.   Interventions:  . Discussed medications. Reports taking medications as prescribed. Currently receiving compliance packages via [employer]. Confirmed that Olen Pel, NP with [facility] will administer monthly Haldol injections.  . Discussed blood pressure parameters. Reports BP is being measured by [facility] staff and during clinic appts. Reports readings have been within range. . Reviewed safety and fall prevention measures. Encouraged to continue using assistive devices as needed. Encouraged to update care management team with concerns regarding decrease in activity tolerance. Reports using caution and ambulating well.  . Discussed plan for ongoing care management and follow-up. Declines urgent concerns or changes in care management needs. Her cousin remains in the home and is available to assist as needed. A  referral was previously placed to re-establish services at [facility].Per staff, the referral was declined d/t history of noncompliance with treatment. Discussed options for services. She prefers to receive in-home services with [facility]. Reports monthly Haldol injections are currently being administered by this team. Confirmed pending outreach with Olen Pel, NP.    Patient Self Care Activities:  . Unable to perform ADLs independently . Unable to perform IADLs independently   Please see past updates related to this goal by clicking on the "Past Updates" button in the selected goal          Ms. Foulkes verbalized understanding of the information discussed during the telephonic outreach today. Declined need for mailed/printed instructions.   The care management team will follow-up with Ms. Perra next month.   [facility] [phone]

## 2020-03-07 ENCOUNTER — Ambulatory Visit: Payer: Medicare Other

## 2020-03-20 DIAGNOSIS — G2401 Drug induced subacute dyskinesia: Secondary | ICD-10-CM | POA: Diagnosis not present

## 2020-03-27 ENCOUNTER — Other Ambulatory Visit: Payer: Self-pay | Admitting: Family Medicine

## 2020-03-27 DIAGNOSIS — E876 Hypokalemia: Secondary | ICD-10-CM

## 2020-03-27 DIAGNOSIS — I10 Essential (primary) hypertension: Secondary | ICD-10-CM

## 2020-03-28 ENCOUNTER — Ambulatory Visit: Payer: Medicare Other

## 2020-04-03 ENCOUNTER — Telehealth: Payer: Self-pay

## 2020-04-03 NOTE — Telephone Encounter (Signed)
  04/03/20 Name: Lauren [name]           MRN: [id number]       DOB: [birth year]   Primary Care Provider: Steele Sizer, MD Reason for referral : Chronic Care Management   Lauren [name] is a 69 y.o. year old female who is a primary care patient of Steele Sizer, MD. She is currently engaged with the chronic care management team.   Attempted outreach today. Outreach unsuccessful. A HIPAA compliant voice message was left requesting a return call.   PLAN The care management team will reach out again within the next two to three weeks.    [place] [facility] [phone]

## 2020-04-08 ENCOUNTER — Ambulatory Visit
Admission: RE | Admit: 2020-04-08 | Discharge: 2020-04-08 | Disposition: A | Discharge status: Home / Self Care | Payer: Medicare Other | Source: Ambulatory Visit | Attending: Neurosurgery | Admitting: Neurosurgery

## 2020-04-08 ENCOUNTER — Other Ambulatory Visit: Payer: Self-pay

## 2020-04-08 DIAGNOSIS — H7093 Unspecified mastoiditis, bilateral: Secondary | ICD-10-CM | POA: Diagnosis not present

## 2020-04-08 DIAGNOSIS — D329 Benign neoplasm of meninges, unspecified: Secondary | ICD-10-CM | POA: Insufficient documentation

## 2020-04-08 DIAGNOSIS — G9389 Other specified disorders of brain: Secondary | ICD-10-CM | POA: Diagnosis not present

## 2020-04-08 DIAGNOSIS — G936 Cerebral edema: Secondary | ICD-10-CM | POA: Diagnosis not present

## 2020-04-18 DIAGNOSIS — D329 Benign neoplasm of meninges, unspecified: Secondary | ICD-10-CM | POA: Diagnosis not present

## 2020-04-19 ENCOUNTER — Ambulatory Visit (INDEPENDENT_AMBULATORY_CARE_PROVIDER_SITE_OTHER): Payer: Medicare Other

## 2020-04-19 DIAGNOSIS — E782 Mixed hyperlipidemia: Secondary | ICD-10-CM | POA: Diagnosis not present

## 2020-04-19 DIAGNOSIS — I10 Essential (primary) hypertension: Secondary | ICD-10-CM | POA: Diagnosis not present

## 2020-04-19 DIAGNOSIS — E119 Type 2 diabetes mellitus without complications: Secondary | ICD-10-CM | POA: Diagnosis not present

## 2020-04-19 DIAGNOSIS — D329 Benign neoplasm of meninges, unspecified: Secondary | ICD-10-CM

## 2020-04-19 NOTE — Chronic Care Management (AMB) (Signed)
Chronic Care Management   Follow Up Note   04/19/2020 Name: Lauren [name] MRN: [id number] DOB: Jul 31, 1951  Primary Care Provider: Steele Sizer, MD Reason for referral : Chronic Care Management  Lauren [name] is a 69 y.o. year old female who is a primary care patient of Steele Sizer, MD. She is currently engaged with the chronic care management team. A routine telephonic outreach was conducted today.  Review of Lauren [name]'s status, including review of consultants reports, relevant labs and test results was conducted today. Collaboration with appropriate care team members was performed as part of the comprehensive evaluation and provision of chronic care management services.    SDOH (Social Determinants of Health) assessments performed: Yes SDOH Interventions     Most Recent Value  SDOH Interventions  Food Insecurity Interventions Other (Comment)  [Requesting assistance .Reports not receiving food stamps/EBT benefit this month. Will submit referral to Care Guide for nutritional assistance.]  Transportation Interventions Other (Comment)  [Requesting assistance. Reports caregiver may not be readily available to assist with future medical appointments. Will submit Care Guide referral for transportation assistance.]        Outpatient Encounter Medications as of 04/19/2020  Medication Sig Note  . aspirin EC 81 MG tablet Take 1 tablet (81 mg total) by mouth daily before breakfast.   . atorvastatin (LIPITOR) 40 MG tablet TAKE ONE TABLET BY MOUTH ONCE DAILY AT 6 PM.   . benztropine (COGENTIN) 1 MG tablet Take 1-2 tablets (1-2 mg total) by mouth 2 (two) times daily. One tablet (1mg ) in the morning and two tablets (2mg ) at bedtime   . escitalopram (LEXAPRO) 10 MG tablet Take 1 tablet (10 mg total) by mouth daily.   [name] gabapentin (NEURONTIN) 100 MG capsule Take 1 capsule (100 mg total) by mouth 2 (two) times daily.   . haloperidol decanoate (HALDOL DECANOATE) 100 MG/ML  injection Inject 100 mg into the muscle every 28 (twenty-eight) days.  09/18/2019: Filled 08/30/2019  . hydrochlorothiazide (HYDRODIURIL) 12.5 MG tablet TAKE ONE TABLET BY MOUTH ONCE DAILY. STOP TAKING 25 MG.   . levETIRAcetam (KEPPRA) 500 MG tablet Take 1 tablet (500 mg total) by mouth 2 (two) times daily.   [name] lisinopril (ZESTRIL) 20 MG tablet TAKE ONE TABLET BY MOUTH ONCE DAILY.   [name] potassium chloride (KLOR-CON) 10 MEQ tablet TAKE ONE TABLET BY MOUTH ONCE DAILY.   . traZODone (DESYREL) 50 MG tablet Take 1 tablet (50 mg total) by mouth at bedtime.    No facility-administered encounter medications on file as of 04/19/2020.     Goals Addressed            This Visit's Progress   . Chronic Disease Management       CARE PLAN ENTRY (see longtitudinal plan of care for additional care plan information)  Current Barriers:  . Chronic Disease Management support and education needs related to Hypertension, Diabetes, Hyperlipidemia.  Case Manager Clinical Goal(s):  [name] Over the next 90 days, patient will be hospitalized due to chronic disease related complications. . Over the next 90 days, patient will attend all medical appointments as scheduled. . Over the next 90 days, patient will take all medications as prescribed. . Over the next 90 days, patient will monitor blood pressure and record readings. . Over the next 90 days, patient will adhere to recommended cardiac prudent/diabetic diet. . Over the next 90 days, patient will follow recommended safety measures to prevent falls. . Over the next 30 days, patient will obtain glucometer and  supplies if approved by MD and monitor blood glucose levels daily.   Interventions:  . Discussed plan for ongoing care management and follow-up. Lauren [name] reports taking her medications as prescribed and continues to receive injections and outreach with the [facility] NP, Olen Pel. She reports doing well in the home but does complain of increased joint  pain over the past few weeks. Denies recent falls but requesting to be evaluated in the clinic. She is concerned that her family member/caregiver may not be available to assist with transportation as previously arranged. Confirmed [employer] as her current insurance provider. Will follow-up to see if transportation assistance is available via this benefit. Also concerned that she has not received her monthly nutritional/EBT benefit. She is agreeable to outreach from a Care Guide to assist with nutrition and discuss options for transporation. A PCP visit has been scheduled for 04/24/20.    Patient Self Care Activities:  . Unable to perform ADLs independently . Unable to perform IADLs independently   Please see past updates related to this goal by clicking on the "Past Updates" button in the selected goal           PLAN -Referral placed for Care Guide assistance. -Lauren [name] is scheduled for a PCP visit on 04/24/20. -The care management team will follow up within the next two weeks.    [name] [phone]

## 2020-04-20 ENCOUNTER — Telehealth: Payer: Self-pay

## 2020-04-20 NOTE — Telephone Encounter (Signed)
04/20/20 [name] patient's relative was not at home will attempt to call again later today regarding transportation and food.  Left message on voicemail for Waymond Cera at [employer] to sign patient up.  Called [employer] patient does not have any more trips left they have all been used.  Representative suggested member call to see if more trips can be approved.  [place] transportation only travels within [employer].  The only transportation options would be [employer] or [employer] and they are expensive.  Ambrose Mantle [phone]

## 2020-04-24 ENCOUNTER — Ambulatory Visit (INDEPENDENT_AMBULATORY_CARE_PROVIDER_SITE_OTHER): Payer: Medicare Other | Admitting: Family Medicine

## 2020-04-24 ENCOUNTER — Encounter: Payer: Self-pay | Admitting: Family Medicine

## 2020-04-24 ENCOUNTER — Other Ambulatory Visit: Payer: Self-pay

## 2020-04-24 VITALS — BP 114/78 | HR 82 | Temp 98.3°F | Resp 20 | Ht 64.0 in | Wt 213.1 lb

## 2020-04-24 DIAGNOSIS — D472 Monoclonal gammopathy: Secondary | ICD-10-CM

## 2020-04-24 DIAGNOSIS — D329 Benign neoplasm of meninges, unspecified: Secondary | ICD-10-CM

## 2020-04-24 DIAGNOSIS — R05 Cough: Secondary | ICD-10-CM | POA: Diagnosis not present

## 2020-04-24 DIAGNOSIS — E1169 Type 2 diabetes mellitus with other specified complication: Secondary | ICD-10-CM | POA: Diagnosis not present

## 2020-04-24 DIAGNOSIS — I700 Atherosclerosis of aorta: Secondary | ICD-10-CM | POA: Diagnosis not present

## 2020-04-24 DIAGNOSIS — I10 Essential (primary) hypertension: Secondary | ICD-10-CM | POA: Diagnosis not present

## 2020-04-24 DIAGNOSIS — G4733 Obstructive sleep apnea (adult) (pediatric): Secondary | ICD-10-CM

## 2020-04-24 DIAGNOSIS — R569 Unspecified convulsions: Secondary | ICD-10-CM

## 2020-04-24 DIAGNOSIS — E669 Obesity, unspecified: Secondary | ICD-10-CM

## 2020-04-24 DIAGNOSIS — F200 Paranoid schizophrenia: Secondary | ICD-10-CM | POA: Diagnosis not present

## 2020-04-24 DIAGNOSIS — R0689 Other abnormalities of breathing: Secondary | ICD-10-CM

## 2020-04-24 DIAGNOSIS — R471 Dysarthria and anarthria: Secondary | ICD-10-CM

## 2020-04-24 LAB — POCT GLYCOSYLATED HEMOGLOBIN (HGB A1C): Hemoglobin A1C: 6.2 % — AB (ref 4.0–5.6)

## 2020-04-24 MED ORDER — LISINOPRIL-HYDROCHLOROTHIAZIDE 10-12.5 MG PO TABS: 1.0000 | ORAL_TABLET | Freq: Every day | ORAL | 1 refills | Status: DC

## 2020-04-24 NOTE — Progress Notes (Signed)
Name: Lauren [name]   MRN: [id number]    DOB: 1951-02-10   Date:04/24/2020       Progress Note  Subjective  Chief Complaint  Chief Complaint  Patient presents with  . Follow-up  . Diabetes  . Joint Pain    HPI  SOB/Tachypenia: seen by Dr. Lanney [name] ,he felt like symptoms likely from extra pyramidal effects. Patient seems to be breathing better today.   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 She is now living in  [place] with her cousin - Lauren [name] Her cousin Lauren [name] will be managing her money and paying her bills from now on. She will be paying a rent fee to live there but is happy to do it   Recurrence of Meningioma: resection done in 2012 with developed of left side numbness, she went to [facility] and CT showed recurrence 01/2018 . She saw Dr. Lacinda [name] but she did not want to have repeat surgery therefore seen by Dr. Baruch [name] and s/ p radiation.Shedenies headaches, nausea, vomiting, problems with her vision but states she states tremors are intermittent now. History of seizures, taking Keppra and no symptoms in a long time. [name] Keep follow up with Dr. Melrose [name]   Aorta atherosclerosis: found on CT chest done Nov 2018. She is on statin and aspirin daily.No side effects of medication. We will recheck lipid panel today   DMII:  taking metforminand last A1C today was 6.2 %  Eye exam is up to date. She has polyphagia, polydipsia and polyuria.On statin therapy and aspirin. 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 BP is at goal today   Morbid obesity: she has BMI above 35 with co-morbidities, such as HTN and DM. Discussed healthier diet , she states she has  been exercising at home, stretching her legs while sitting   OA: she states she noticed some pain after exercising, she is doing exercises while sitting. Discussed home PT but she wants to hold off for now.    Patient Active Problem List   Diagnosis Date Noted  . 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 SS-A antibody positive 04/15/2019  . Slurred speech 03/04/2019  . Chest pain in adult 02/15/2019  . Seizure-like activity ([place]) 12/29/2018  . Seizures ([place]) 11/24/2018  . Paresthesias 11/22/2018  . Mouth dryness 10/12/2018  . Hx of resection of meningioma 07/21/2018  . Tremor 07/21/2018  . Morbid obesity ([place]) 12/09/2017  . Meningioma ([place]) 11/25/2017  . Atherosclerosis of aorta ([place]) 11/25/2017  . Calcification of coronary artery 11/25/2017  . Schizophrenia ([place]) 11/04/2017  . Acid reflux 06/29/2015  . Diabetes mellitus type 2, controlled, without complications ([place]) 37/85/8850  . Cardiac murmur 06/29/2015  . Arthritis of knee, degenerative 06/28/2015  . Insomnia w/ sleep apnea 03/21/2015  . Anxiety disorder 03/21/2015  . Hypertension 03/21/2015  . HLD (hyperlipidemia) 03/21/2015  . Urge incontinence 03/21/2015    Past Surgical History:  Procedure Laterality Date  . BRAIN TUMOR EXCISION  2012   benign  . CARDIAC CATHETERIZATION  2008  . CESAREAN SECTION    . CYST REMOVAL HAND    . LUNG LOBECTOMY  1977   benign tumor  . TOTAL KNEE ARTHROPLASTY  Left 01/28/2017   Procedure: TOTAL KNEE ARTHROPLASTY;  Surgeon: Lauren Mull, MD;  Location: [facility] ORS;  Service: Orthopedics;  Laterality: Left;    Family History  Problem Relation Age of Onset  . Heart disease Brother   . Depression Mother   . Heart attack Mother   . Stroke Mother   . Alcohol abuse Father   . Stroke Father   . Diabetes Sister   . Diabetes Sister   . Stomach cancer Sister   . Kidney disease Sister   . COPD Brother   . Lung cancer Brother   . Diabetes Brother     Social History   Tobacco Use  . Smoking  status: Never Smoker  . Smokeless tobacco: Never Used  Substance Use Topics  . Alcohol use: No    Alcohol/week: 0.0 standard drinks     Current Outpatient Medications:  .  aspirin EC 81 MG tablet, Take 1 tablet (81 mg total) by mouth daily before breakfast., Disp: 30 tablet, Rfl: 5 .  atorvastatin (LIPITOR) 40 MG tablet, TAKE ONE TABLET BY MOUTH ONCE DAILY AT 6 PM., Disp: 90 tablet, Rfl: 1 .  escitalopram (LEXAPRO) 10 MG tablet, Take 1 tablet (10 mg total) by mouth daily., Disp: 90 tablet, Rfl: 1 .  gabapentin (NEURONTIN) 100 MG capsule, Take 1 capsule (100 mg total) by mouth 2 (two) times daily., Disp: 180 capsule, Rfl: 1 .  haloperidol decanoate (HALDOL DECANOATE) 100 MG/ML injection, Inject 100 mg into the muscle every 28 (twenty-eight) days. , Disp: , Rfl:  .  potassium chloride (KLOR-CON) 10 MEQ tablet, TAKE ONE TABLET BY MOUTH ONCE DAILY., Disp: 90 tablet, Rfl: 0 .  traZODone (DESYREL) 50 MG tablet, Take 1 tablet (50 mg total) by mouth at bedtime., Disp: 30 tablet, Rfl: 3 .  benztropine (COGENTIN) 1 MG tablet, Take 1-2 tablets (1-2 mg total) by mouth 2 (two) times daily. One tablet (1mg ) in the morning and two tablets (2mg ) at bedtime, Disp: 120 tablet, Rfl: 2 .  levETIRAcetam (KEPPRA) 500 MG tablet, Take 1 tablet (500 mg total) by mouth 2 (two) times daily., Disp: 60 tablet, Rfl: 1 .  lisinopril-hydrochlorothiazide (ZESTORETIC) 10-12.5 MG tablet, Take 1 tablet by mouth daily., Disp: 90 tablet, Rfl: 1  Allergies  Allergen Reactions  . Percocet [Oxycodone-Acetaminophen] Diarrhea, Nausea And Vomiting and Nausea Only  . Tramadol Hcl Diarrhea, Nausea And Vomiting and Nausea Only  . Vicodin [Hydrocodone-Acetaminophen] Diarrhea, Nausea And Vomiting and Nausea Only    I personally reviewed active problem list, medication list, allergies, family history, social history, health maintenance with the patient/caregiver today.   ROS  Constitutional: Negative for fever, positive for  weight  change.  Respiratory: Negative for cough and shortness of breath.   Cardiovascular: Negative for chest pain or palpitations.  Gastrointestinal: Negative for abdominal pain, no bowel changes.  Musculoskeletal: positive  for gait problem but no joint swelling.  Skin: Negative for rash.  Neurological: Negative for dizziness or headache.  No other specific complaints in a complete review of systems (except as listed in HPI above).  Objective  Vitals:   04/24/20 1456  BP: 114/78  Pulse: 82  Resp: 20  Temp: 98.3 F (36.8 C)  TempSrc: Oral  SpO2: 99%  Weight: 213 lb 1.6 oz (96.7 kg)  Height: 5\' 4"  (1.626 m)    Body mass index is 36.58 kg/m.  Physical Exam  Constitutional: Patient appears well-developed and well-nourished. Obese  No distress.  HEENT: head atraumatic, normocephalic, pupils equal  and reactive to light, neck supple, No BLE edema. Pulmonary/Chest: Effort normal and breath sounds normal. No respiratory distress. Abdominal: Soft.  There is no tenderness. Psychiatric: Patient has a normal mood and affect. behavior is normal. Judgment and thought content normal.   Diabetic Foot Exam: Diabetic Foot Exam - Simple   Simple Foot Form Diabetic Foot exam was performed with the following findings: Yes 04/24/2020  3:19 PM  Visual Inspection No deformities, no ulcerations, no other skin breakdown bilaterally: Yes Sensation Testing Intact to touch and monofilament testing bilaterally: Yes Pulse Check Posterior Tibialis and Dorsalis pulse intact bilaterally: Yes Comments      PHQ2/9: Depression screen [facility] 2/9 12/06/2019 11/24/2019 10/26/2019 09/21/2019 06/07/2019  Decreased Interest 0 0 0 1 0  Down, Depressed, Hopeless 0 0 0 1 0  PHQ - 2 Score 0 0 0 2 0  Altered sleeping 0 - 2 2 0  Tired, decreased energy 0 - 1 2 0  Change in appetite 0 - 1 1 0  Feeling bad or failure about yourself  0 - 0 3 0  Trouble concentrating 0 - 2 2 0  Moving slowly or fidgety/restless 0 - 1 1 0   Suicidal thoughts 0 - 0 0 0  PHQ-9 Score 0 - 7 13 0  Difficult doing work/chores - - Somewhat difficult Somewhat difficult Not difficult at all  Some recent data might be hidden    phq 9 is negative   Fall Risk: Fall Risk  04/24/2020 11/24/2019 10/26/2019 10/08/2019 09/21/2019  Falls in the past year? - 0 0 0 0  Number falls in past yr: - - 0 0 0  Injury with Fall? - - 0 0 0  Risk for fall due to : Impaired balance/gait;History of fall(s) Impaired balance/gait;Medication side effect No Fall Risks - -  Follow up - Falls prevention discussed Falls prevention discussed - -     Functional Status Survey: Is the patient deaf or have difficulty hearing?: No Does the patient have difficulty seeing, even when wearing glasses/contacts?: No Does the patient have difficulty concentrating, remembering, or making decisions?: Yes Does the patient have difficulty walking or climbing stairs?: Yes Does the patient have difficulty dressing or bathing?: No Does the patient have difficulty doing errands alone such as visiting a doctor's office or shopping?: Yes   Assessment & Plan  1. Type 2 diabetes mellitus with obesity (HCC)  - POCT HgB A1C - Microalbumin / creatinine urine ratio  2. Essential hypertension  - CBC with Differential/Platelet - COMPLETE METABOLIC PANEL WITH GFR - Microalbumin / creatinine urine ratio - lisinopril-hydrochlorothiazide (ZESTORETIC) 10-12.5 MG tablet; Take 1 tablet by mouth daily.  Dispense: 90 tablet; Refill: 1  3. Meningioma ([place])  Up to date with follow up with Dr. Lacinda [name]   4. Schizophrenia, paranoid ([place])   5. Atherosclerosis of aorta (HCC)  - Lipid panel  6. Morbid obesity ([place])  Discussed with the patient the risk posed by an increased BMI. Discussed importance of portion control, calorie counting and at least 150 minutes of physical activity weekly. Avoid sweet beverages and drink more water. Eat at least 6 servings of fruit and vegetables daily   7.  Seizure-like activity ([place])  Sees dr. Melrose [name]  8. Dysarthria  stable  9. OSA (obstructive sleep apnea)  Reminded her importance of wearing it every night   10. Irregular breathing pattern  Stable   11. IgA monoclonal gammopathy of uncertain significance  Lost to follow up with hematologist

## 2020-04-24 NOTE — Patient Instructions (Addendum)
  Thank you for allowing the Chronic Care Management team to participate in your care.   Goals Addressed            This Visit's Progress   . Chronic Disease Management       CARE PLAN ENTRY (see longtitudinal plan of care for additional care plan information)  Current Barriers:  . Chronic Disease Management support and education needs related to Hypertension, Diabetes, Hyperlipidemia.  Case Manager Clinical Goal(s):  [name] Over the next 90 days, patient will be hospitalized due to chronic disease related complications. . Over the next 90 days, patient will attend all medical appointments as scheduled. . Over the next 90 days, patient will take all medications as prescribed. . Over the next 90 days, patient will monitor blood pressure and record readings. . Over the next 90 days, patient will adhere to recommended cardiac prudent/diabetic diet. . Over the next 90 days, patient will follow recommended safety measures to prevent falls. . Over the next 30 days, patient will obtain glucometer and supplies if approved by MD and monitor blood glucose levels daily.   Interventions:  . Discussed plan for ongoing care management and follow-up. Lauren [name] reports taking her medications as prescribed and continues to receive injections and outreach with the [facility] NP, Olen Pel. She reports doing well in the home but does complain of increased joint pain over the past few weeks. Denies recent falls but requesting to be evaluated in the clinic. She is concerned that her family member/caregiver may not be available to assist with transportation as previously arranged. Confirmed [employer] as her current insurance provider. Will follow-up to see if transportation assistance is available via this benefit. Also concerned that she has not received her monthly nutritional/EBT benefit. She is agreeable to outreach from a Care Guide to assist with nutrition and discuss options for transporation.  A PCP visit has been scheduled for 04/24/20.    Patient Self Care Activities:  . Unable to perform ADLs independently . Unable to perform IADLs independently   Please see past updates related to this goal by clicking on the "Past Updates" button in the selected goal         Lauren [name] verbalized understanding of the information discussed during the telephonic outreach today. Declined need for a mailed/printed copy of the information.    -Referral placed for Care Guide assistance. -Lauren [name] is scheduled for a PCP visit on 04/24/20. -The care management team will follow up within the next two weeks.    [name] Care Management [facility] [phone]

## 2020-04-25 LAB — CBC WITH DIFFERENTIAL/PLATELET
Absolute Monocytes: 405 cells/uL (ref 200–950)
Basophils Absolute: 59 cells/uL (ref 0–200)
Basophils Relative: 1.3 %
Eosinophils Absolute: 131 cells/uL (ref 15–500)
Eosinophils Relative: 2.9 %
HCT: 36.1 % (ref 35.0–45.0)
Hemoglobin: 12.2 g/dL (ref 11.7–15.5)
Lymphs Abs: 2192 cells/uL (ref 850–3900)
MCH: 31.5 pg (ref 27.0–33.0)
MCHC: 33.8 g/dL (ref 32.0–36.0)
MCV: 93.3 fL (ref 80.0–100.0)
MPV: 11 fL (ref 7.5–12.5)
Monocytes Relative: 9 %
Neutro Abs: 1715 cells/uL (ref 1500–7800)
Neutrophils Relative %: 38.1 %
Platelets: 209 10*3/uL (ref 140–400)
RBC: 3.87 10*6/uL (ref 3.80–5.10)
RDW: 13.2 % (ref 11.0–15.0)
Total Lymphocyte: 48.7 %
WBC: 4.5 10*3/uL (ref 3.8–10.8)

## 2020-04-25 LAB — COMPLETE METABOLIC PANEL WITH GFR
AG Ratio: 1.5 (calc) (ref 1.0–2.5)
ALT: 14 U/L (ref 6–29)
AST: 15 U/L (ref 10–35)
Albumin: 4 g/dL (ref 3.6–5.1)
Alkaline phosphatase (APISO): 57 U/L (ref 37–153)
BUN: 17 mg/dL (ref 7–25)
CO2: 28 mmol/L (ref 20–32)
Calcium: 9.4 mg/dL (ref 8.6–10.4)
Chloride: 106 mmol/L (ref 98–110)
Creat: 0.77 mg/dL (ref 0.50–0.99)
GFR, Est African American: 93 mL/min/{1.73_m2} (ref >=60)
GFR, Est Non African American: 80 mL/min/{1.73_m2} (ref >=60)
Globulin: 2.7 g/dL (calc) (ref 1.9–3.7)
Glucose, Bld: 122 mg/dL — ABNORMAL HIGH (ref 65–99)
Potassium: 3.9 mmol/L (ref 3.5–5.3)
Sodium: 141 mmol/L (ref 135–146)
Total Bilirubin: 0.7 mg/dL (ref 0.2–1.2)
Total Protein: 6.7 g/dL (ref 6.1–8.1)

## 2020-04-25 LAB — LIPID PANEL
Cholesterol: 170 mg/dL (ref <200)
HDL: 41 mg/dL — ABNORMAL LOW (ref >=50)
LDL Cholesterol (Calc): 107 mg/dL (calc) — ABNORMAL HIGH
Non-HDL Cholesterol (Calc): 129 mg/dL (calc) (ref <130)
Total CHOL/HDL Ratio: 4.1 (calc) (ref <5.0)
Triglycerides: 119 mg/dL (ref <150)

## 2020-04-25 LAB — MICROALBUMIN / CREATININE URINE RATIO
Creatinine, Urine: 128 mg/dL (ref 20–275)
Microalb Creat Ratio: 3 mcg/mg creat (ref <30)
Microalb, Ur: 0.4 mg/dL

## 2020-04-27 NOTE — Telephone Encounter (Signed)
04/27/20 Follow-up call. Unable to leave message regarding transportation and food stamps voicemail not set-up.  Will attempt to call again 04/28/20 Lauren [name] [phone]

## 2020-04-28 NOTE — Telephone Encounter (Signed)
04/28/20 Unable to leave message regarding transportation and food stamps voicemail not set-up.  Will attempt to call again next week.  Lauren [name] [phone]

## 2020-05-03 ENCOUNTER — Telehealth: Payer: Self-pay

## 2020-05-08 NOTE — Telephone Encounter (Signed)
    MA8/30/2021 3rd Attempt  Name: Lauren [name]   MRN: [id number]   DOB: 1951-07-28   AGE: 69 y.o.   GENDER: female   PCP Steele Sizer, MD.   8/30/21Unable to leave message regarding transportation and food stamps voicemail not set-up. Third unsuccessful attempt to contact patient. Closing referral.   Maycol Hoying, AAS Paralegal, [place] . Embedded Care Coordination [facility]  Care Management  [address], [place] [address] [email or web address]  [phone]   [email or web address]

## 2020-05-11 ENCOUNTER — Ambulatory Visit (HOSPITAL_COMMUNITY)
Admission: RE | Admit: 2020-05-11 | Discharge: 2020-05-11 | Disposition: A | Discharge status: Home / Self Care | Payer: Medicare Other | Source: Ambulatory Visit | Attending: Family Medicine | Admitting: Family Medicine

## 2020-05-11 ENCOUNTER — Other Ambulatory Visit: Payer: Self-pay

## 2020-05-11 DIAGNOSIS — Z1231 Encounter for screening mammogram for malignant neoplasm of breast: Secondary | ICD-10-CM | POA: Insufficient documentation

## 2020-05-11 DIAGNOSIS — E2839 Other primary ovarian failure: Secondary | ICD-10-CM | POA: Insufficient documentation

## 2020-05-11 DIAGNOSIS — Z780 Asymptomatic menopausal state: Secondary | ICD-10-CM | POA: Diagnosis not present

## 2020-05-11 DIAGNOSIS — Z13820 Encounter for screening for osteoporosis: Secondary | ICD-10-CM | POA: Diagnosis not present

## 2020-05-22 ENCOUNTER — Other Ambulatory Visit: Payer: Self-pay | Admitting: Family Medicine

## 2020-05-23 ENCOUNTER — Other Ambulatory Visit: Payer: Self-pay | Admitting: Family Medicine

## 2020-05-25 DIAGNOSIS — R05 Cough: Secondary | ICD-10-CM | POA: Diagnosis not present

## 2020-05-25 DIAGNOSIS — Z79899 Other long term (current) drug therapy: Secondary | ICD-10-CM | POA: Diagnosis not present

## 2020-06-07 ENCOUNTER — Ambulatory Visit: Payer: Medicare Other | Admitting: Family Medicine

## 2020-06-19 ENCOUNTER — Other Ambulatory Visit: Payer: Self-pay | Admitting: Family Medicine

## 2020-06-19 DIAGNOSIS — E876 Hypokalemia: Secondary | ICD-10-CM

## 2020-06-23 DIAGNOSIS — G2401 Drug induced subacute dyskinesia: Secondary | ICD-10-CM | POA: Diagnosis not present

## 2020-06-26 ENCOUNTER — Ambulatory Visit (INDEPENDENT_AMBULATORY_CARE_PROVIDER_SITE_OTHER): Payer: Medicare Other

## 2020-06-26 DIAGNOSIS — I10 Essential (primary) hypertension: Secondary | ICD-10-CM | POA: Diagnosis not present

## 2020-06-26 IMAGING — MR MRI CERVICAL SPINE WITHOUT AND WITH CONTRAST
5 of 8 series · 29 of 48 positions shown · IV contrast (Gadavist)
Comparison: None.

CLINICAL DATA: Fall with right-sided paresthesia.

EXAM:
MRI CERVICAL SPINE WITHOUT AND WITH CONTRAST
TECHNIQUE: Multiplanar and multiecho pulse sequences of the cervical spine, to
include the craniocervical junction and cervicothoracic junction,
were obtained without and with intravenous contrast.
CONTRAST:  10 mL Gadavist

[Series 5: T2 · sagittal · 3.0mm · 0.62mm/px · 4 of 17 slices shown (1 of 2)]
[im 1/17]
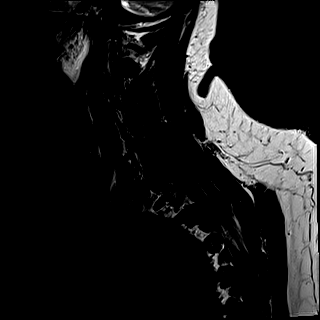
[im 6/17]
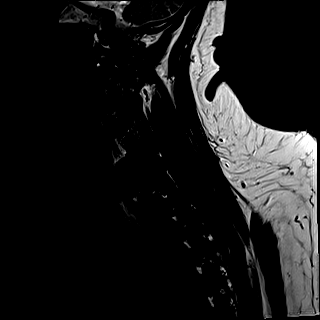
[im 11/17]
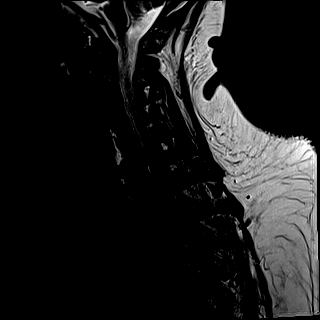
[im 17/17]
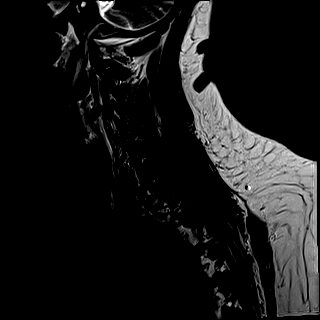

[Series 7: STIR · sagittal · 3.0mm · 0.62mm/px · 4 of 17 slices shown]
[im 1/17]
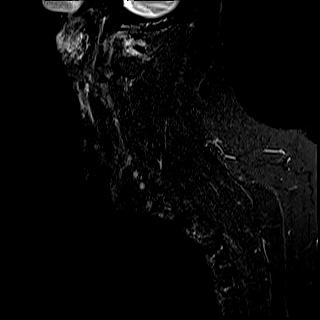
[im 6/17]
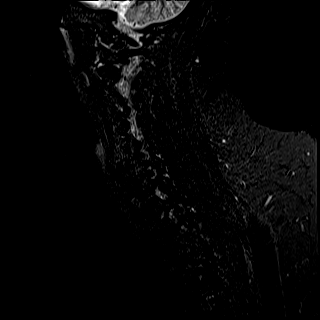
[im 11/17]
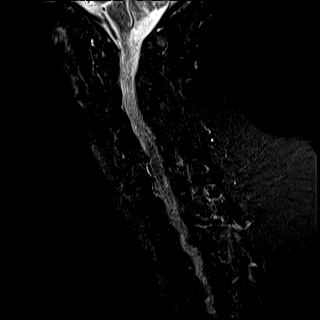
[im 17/17]
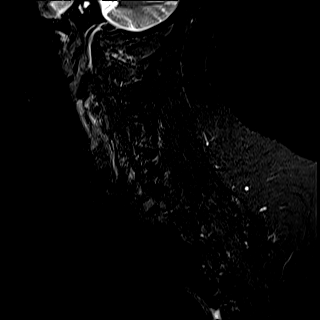

[Series 8: T2 · axial · 3.0mm · 0.70mm/px · z∈[-30,+65]mm · 8 of 29 slices shown (2 of 2)]
[im 1/29]
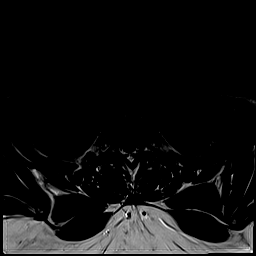
[im 5/29]
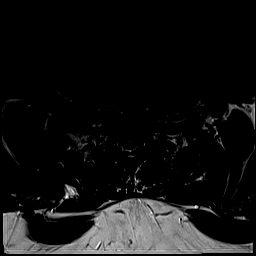
[im 9/29]
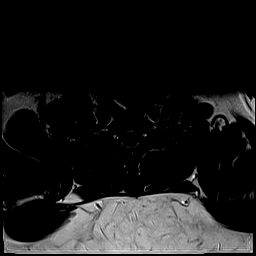
[im 13/29]
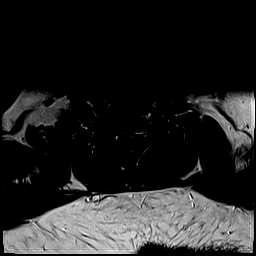
[im 17/29]
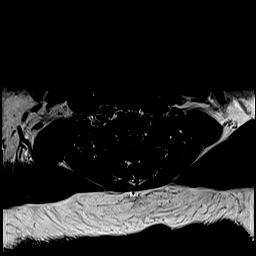
[im 21/29]
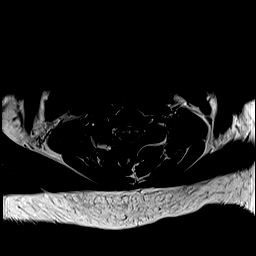
[im 25/29]
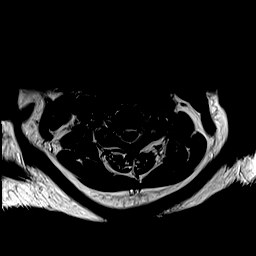
[im 29/29]
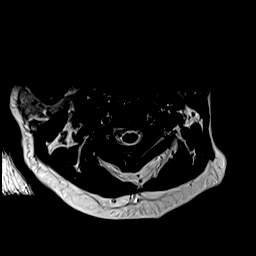

[Series 10: T1 · axial · non-contrast · 3.0mm · 0.35mm/px · z∈[-30,+65]mm · 8 of 29 slices shown]
[im 1/29]
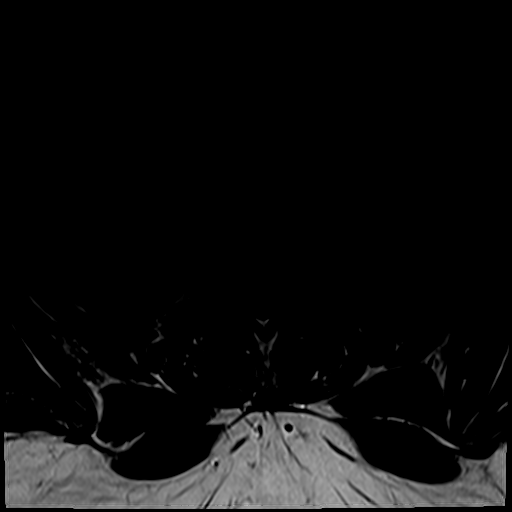
[im 5/29]
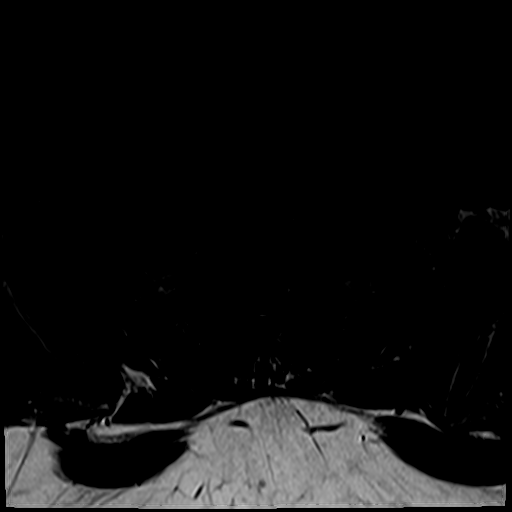
[im 9/29]
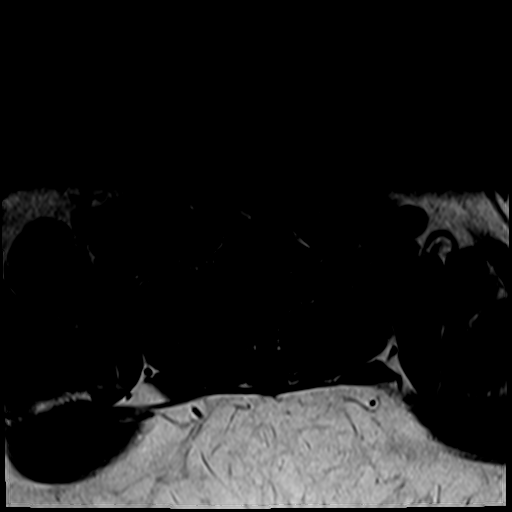
[im 13/29]
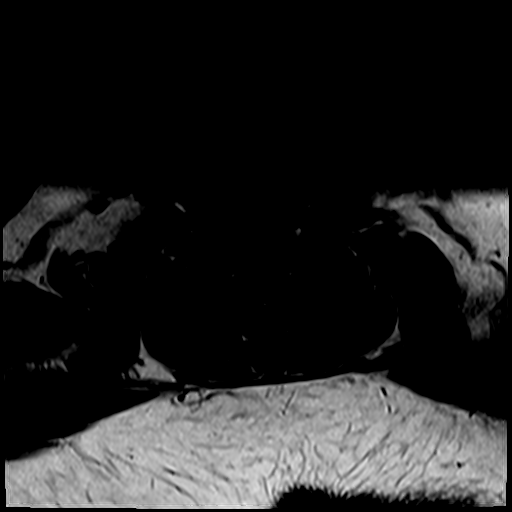
[im 17/29]
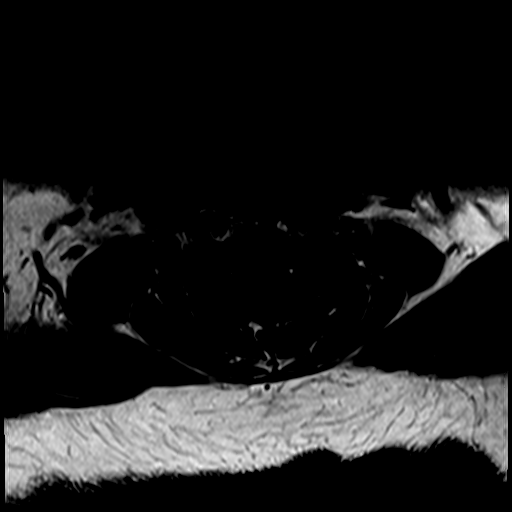
[im 21/29]
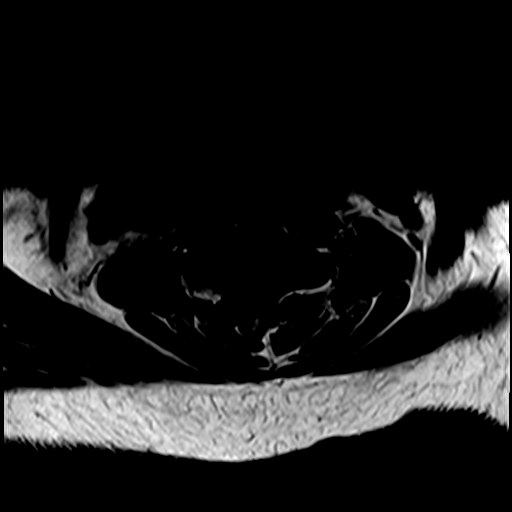
[im 25/29]
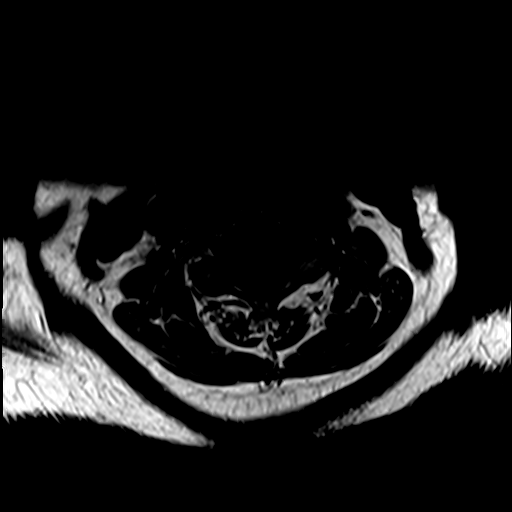
[im 29/29]
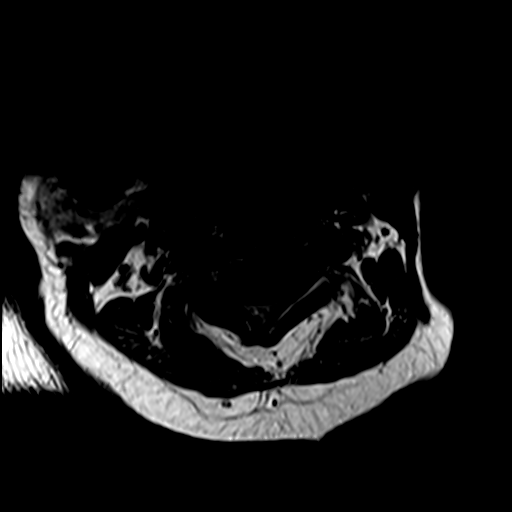

[Series 12: T1 post-contrast · axial · 3.0mm · 0.35mm/px · z∈[-30,+24]mm · 5 of 29 slices shown]
[im 1/29]
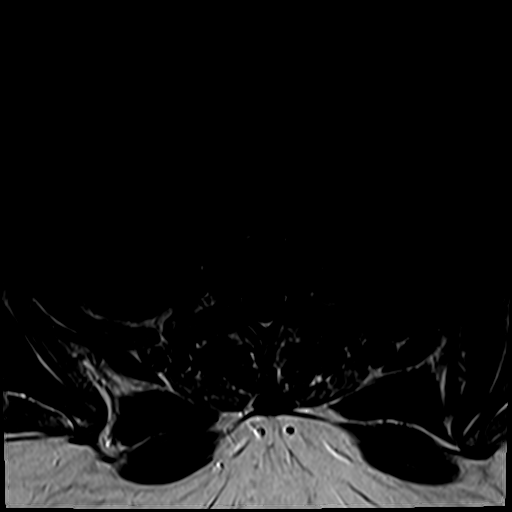
[im 5/29]
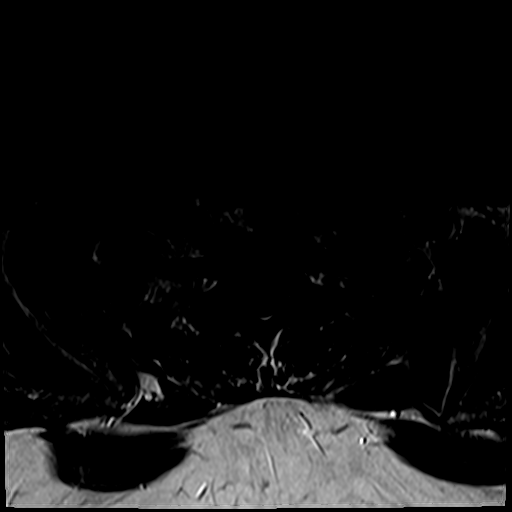
[im 9/29]
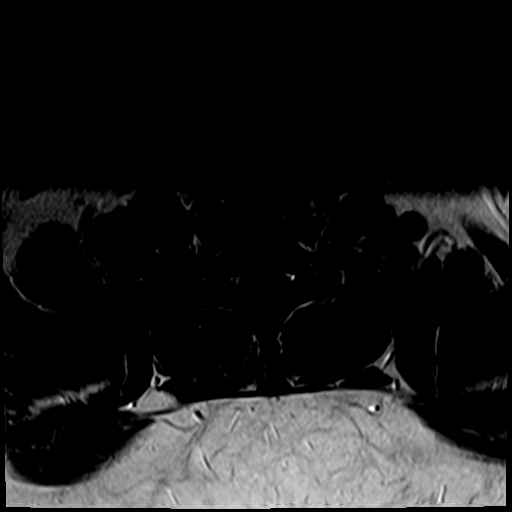
[im 13/29]
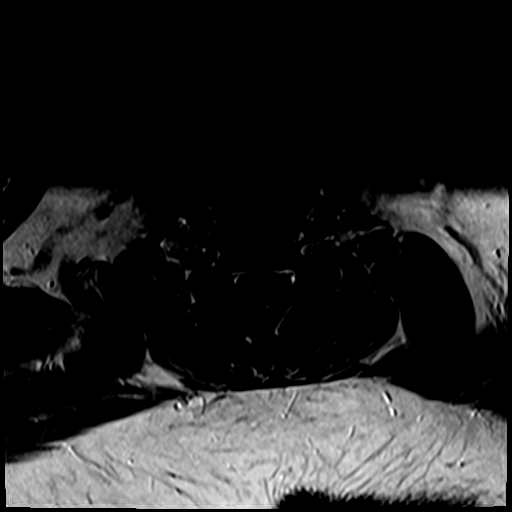
[im 17/29]
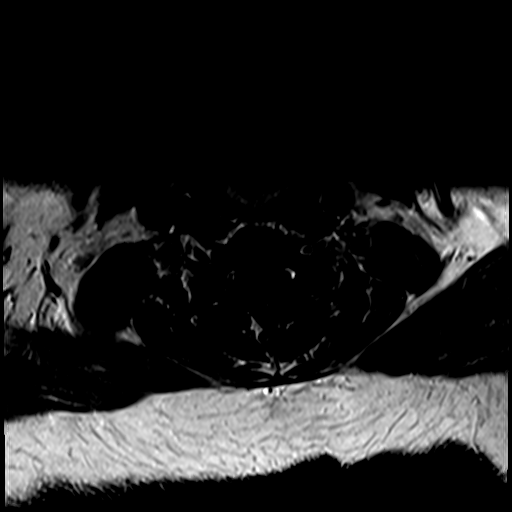

[29 of 48 positions shown; findings below may reference images not displayed]

FINDINGS: Alignment: Normal.

Vertebrae: No focal marrow lesion. No compression fracture or
evidence of discitis osteomyelitis.

Cord: Normal caliber and signal.

Posterior Fossa, vertebral arteries, paraspinal tissues: Visualized
posterior fossa is normal. Vertebral artery flow voids are
preserved. No prevertebral effusion.

Disc levels: Sagittal imaging includes the atlantoaxial joint to the
level of the T3-4 disc space, with axial imaging of the disc spaces
from C2-3 to T1-2.

C1-2: Mild degenerative change without stenosis.

C2-3: No disc herniation or stenosis.

C3-4: Mild left facet hypertrophy.  No stenosis.

C4-5: Mild bilateral facet hypertrophy.  No stenosis.

C5-6: Intermediate disc osteophyte complex with moderate bilateral
neural foraminal stenosis and mild narrowing of the ventral thecal
sac without spinal canal stenosis.

C6-7: Disc bulge with bilateral uncovertebral hypertrophy. Moderate
bilateral neural foraminal stenosis. No spinal canal stenosis.

C7-T1: No disc herniation or stenosis.

T1-2: Normal.

T2-3: Normal.

T3-4: Normal.

No abnormal contrast enhancement.
IMPRESSION: 1. No acute abnormality or contrast enhancing lesion of the cervical
spine.
2. No cervical spinal canal stenosis.
3. Multilevel uncovertebral and facet arthrosis with moderate
bilateral neural foraminal stenosis at C5-6 and C6-7.

## 2020-07-17 NOTE — Patient Instructions (Signed)
Thank you for allowing the Chronic Care Management team to participate in your care.   Goals Addressed            This Visit's Progress   . Chronic Disease Management       CARE PLAN ENTRY (see longtitudinal plan of care for additional care plan information)  Current Barriers:  . Chronic Disease Management support and education needs related to Hypertension, Diabetes, Hyperlipidemia.  Case Manager Clinical Goal(s):  [name] Over the next 90 days, patient will be hospitalized due to chronic disease related complications. . Over the next 90 days, patient will attend all medical appointments as scheduled. . Over the next 90 days, patient will take all medications as prescribed. . Over the next 90 days, patient will monitor blood pressure and record readings. . Over the next 90 days, patient will adhere to recommended cardiac prudent/diabetic diet. . Over the next 90 days, patient will follow recommended safety measures to prevent falls. . Over the next 30 days, patient will obtain glucometer and supplies if approved by MD and monitor blood glucose levels daily.   Interventions:  . Reviewed medications and treatment recommendations. Reports taking medications as prescribed. She continues to receive adherence packages from [employer]. No new concerns regarding prescriptions or medication management. Reports blood pressure readings have been within range.   . Discussed plan for ongoing care management and follow-up. Lauren [name] reports doing well today. She continues to experience joint discomfort and has difficulty ambulating. Reports using her cane and following recommended safety measures. No recent falls. During our previous outreach she expressed concerns regarding transportation and her nutrition benefit. Reports receiving the EBT benefit as expected this month. Confirmed that her cousin anticipates being available for her pending scheduled appointments. Agreed to notify the care  management team if this changes. She remains engaged with the [facility] team. We will plan to follow-up to discuss care management needs again next month.   Patient Self Care Activities:  . Unable to perform ADLs independently . Unable to perform IADLs independently   Please see past updates related to this goal by clicking on the "Past Updates" button in the selected goal        Lauren [name] verbalized understanding of the information discussed during the telephonic outreach today. Declined need for a mailed/printed copy of the instructions.    A member of the care management team will follow up with Lauren [name] next month.    [name] [facility] Care Management [facility] [phone]

## 2020-07-17 NOTE — Chronic Care Management (AMB) (Signed)
Chronic Care Management   Follow Up Note    Name: Lauren [name] MRN: [id number] DOB: 1951/06/07  Primary Care Provider: Steele Sizer, MD Reason for referral : Chronic Care Management   Lauren [name] is a 69 y.o. year old female who is a primary care patient of Steele Sizer, MD. She is currently engaged with the chronic care management team. A routine telephonic outreach was conducted today.  Review of Lauren [name]'s status, including review of consultants reports, relevant labs and test results was conducted today. Collaboration with appropriate care team members was performed as part of the comprehensive evaluation and provision of chronic care management services.    SDOH (Social Determinants of Health) assessments performed: No    Outpatient Encounter Medications as of 06/26/2020  Medication Sig Note  . aspirin EC 81 MG tablet Take 1 tablet (81 mg total) by mouth daily before breakfast.   . atorvastatin (LIPITOR) 40 MG tablet TAKE ONE TABLET BY MOUTH ONCE DAILY AT 6 PM.   . benztropine (COGENTIN) 1 MG tablet Take 1-2 tablets (1-2 mg total) by mouth 2 (two) times daily. One tablet (1mg ) in the morning and two tablets (2mg ) at bedtime 04/24/2020: Needs Refill  . escitalopram (LEXAPRO) 10 MG tablet Take 1 tablet (10 mg total) by mouth daily.   [name] gabapentin (NEURONTIN) 100 MG capsule Take 1 capsule (100 mg total) by mouth 2 (two) times daily.   . haloperidol decanoate (HALDOL DECANOATE) 100 MG/ML injection Inject 100 mg into the muscle every 28 (twenty-eight) days.  09/18/2019: Filled 08/30/2019  . levETIRAcetam (KEPPRA) 500 MG tablet Take 1 tablet (500 mg total) by mouth 2 (two) times daily. 04/24/2020: Needs refill  . lisinopril-hydrochlorothiazide (ZESTORETIC) 10-12.5 MG tablet Take 1 tablet by mouth daily.   . potassium chloride (KLOR-CON) 10 MEQ tablet TAKE ONE TABLET BY MOUTH ONCE DAILY.   . traZODone (DESYREL) 50 MG tablet Take 1 tablet (50 mg total) by  mouth at bedtime.    No facility-administered encounter medications on file as of 06/26/2020.     Goals Addressed            This Visit's Progress   . Chronic Disease Management       CARE PLAN ENTRY (see longtitudinal plan of care for additional care plan information)  Current Barriers:  . Chronic Disease Management support and education needs related to Hypertension, Diabetes, Hyperlipidemia.  Case Manager Clinical Goal(s):  [name] Over the next 90 days, patient will be hospitalized due to chronic disease related complications. . Over the next 90 days, patient will attend all medical appointments as scheduled. . Over the next 90 days, patient will take all medications as prescribed. . Over the next 90 days, patient will monitor blood pressure and record readings. . Over the next 90 days, patient will adhere to recommended cardiac prudent/diabetic diet. . Over the next 90 days, patient will follow recommended safety measures to prevent falls. . Over the next 30 days, patient will obtain glucometer and supplies if approved by MD and monitor blood glucose levels daily.   Interventions:  . Reviewed medications and treatment recommendations. Reports taking medications as prescribed. She continues to receive adherence packages from [employer]. No new concerns regarding prescriptions or medication management. Reports blood pressure readings have been within range.   . Discussed plan for ongoing care management and follow-up. Lauren [name] reports doing well today. She continues to experience joint discomfort and has difficulty ambulating. Reports using her cane and following recommended  safety measures. No recent falls. During our previous outreach she expressed concerns regarding transportation and her nutrition benefit. Reports receiving the EBT benefit as expected this month. Confirmed that her cousin anticipates being available for her pending scheduled appointments. Agreed to  notify the care management team if this changes. She remains engaged with the [facility] team. We will plan to follow-up to discuss care management needs again next month.   Patient Self Care Activities:  . Unable to perform ADLs independently . Unable to perform IADLs independently   Please see past updates related to this goal by clicking on the "Past Updates" button in the selected goal          PLAN A member of the care management team will follow up with Lauren [name] next month.    [name] [facility] Care Management [facility] [phone]

## 2020-07-25 DIAGNOSIS — G2401 Drug induced subacute dyskinesia: Secondary | ICD-10-CM | POA: Diagnosis not present

## 2020-07-28 ENCOUNTER — Ambulatory Visit: Payer: Self-pay

## 2020-08-08 ENCOUNTER — Encounter: Payer: Self-pay | Admitting: Family Medicine

## 2020-08-08 ENCOUNTER — Other Ambulatory Visit: Payer: Self-pay

## 2020-08-08 ENCOUNTER — Ambulatory Visit (INDEPENDENT_AMBULATORY_CARE_PROVIDER_SITE_OTHER): Payer: Medicare Other | Admitting: Family Medicine

## 2020-08-08 VITALS — BP 122/82 | HR 98 | Temp 97.9°F | Resp 18 | Ht 64.0 in | Wt 225.3 lb

## 2020-08-08 DIAGNOSIS — M25511 Pain in right shoulder: Secondary | ICD-10-CM

## 2020-08-08 NOTE — Progress Notes (Signed)
Patient ID: Jaeliana Lococo, female    DOB: Jan 15, 1951, 69 y.o.   MRN: [id number]  PCP: Steele Sizer, MD  Chief Complaint  Patient presents with  . Shoulder Pain    Subjective:   Veralyn Lopp is a 69 y.o. female, presents to clinic with CC of the following:  Presents for shoulder pain - noted to be chronic Last OV with PCP that notes this is from 12/06/2019: Bilateral shoulder pain: going on for months - not sure how long, right worse than left side, difficulty abduction shoulder. She takes otc medication sometimes, like lidocaine cream it seems to help. Pain is described as 6/10, described as sharp  9. Chronic pain of both shoulders  - Ambulatory referral to [place]. Referred for medication therapy management  - Ambulatory referral to Home Health Reviewed imaging: Can only find left shoulder xray from 09/05/2008 in chart  Bilateral shoulder pain, got worse in the last week after COVID and flu booster - had in her right deltoid, but her left shoulder is hurting as well, she reports months of hurting hx, though notes go back for over a decade, no recent injury, no recent strain.  Shoulder Pain  This is a chronic problem. The current episode started more than 1 year ago. The problem has been waxing and waning.     Patient Active Problem List   Diagnosis Date Noted  . 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 SS-A antibody positive 04/15/2019  . Slurred speech 03/04/2019  . Chest pain in adult 02/15/2019  . Seizure-like activity ([place]) 12/29/2018  . Seizures ([place]) 11/24/2018  . Paresthesias 11/22/2018  . Mouth dryness 10/12/2018  . Hx of resection of meningioma 07/21/2018  . Tremor 07/21/2018  . Morbid obesity ([place]) 12/09/2017  . Meningioma ([place]) 11/25/2017  . Atherosclerosis of aorta ([place]) 11/25/2017  . Calcification of coronary artery 11/25/2017  . Schizophrenia ([place]) 11/04/2017  . Acid reflux 06/29/2015  . Diabetes mellitus type 2, controlled,  without complications ([place]) 21/19/4174  . Cardiac murmur 06/29/2015  . Arthritis of knee, degenerative 06/28/2015  . Insomnia w/ sleep apnea 03/21/2015  . Anxiety disorder 03/21/2015  . Hypertension 03/21/2015  . HLD (hyperlipidemia) 03/21/2015  . Urge incontinence 03/21/2015      Current Outpatient Medications:  .  atorvastatin (LIPITOR) 40 MG tablet, TAKE ONE TABLET BY MOUTH ONCE DAILY AT 6 PM., Disp: 90 tablet, Rfl: 1 .  escitalopram (LEXAPRO) 10 MG tablet, Take 1 tablet (10 mg total) by mouth daily., Disp: 90 tablet, Rfl: 1 .  gabapentin (NEURONTIN) 100 MG capsule, Take 1 capsule (100 mg total) by mouth 2 (two) times daily., Disp: 180 capsule, Rfl: 1 .  haloperidol decanoate (HALDOL DECANOATE) 100 MG/ML injection, Inject 100 mg into the muscle every 28 (twenty-eight) days. , Disp: , Rfl:  .  lisinopril-hydrochlorothiazide (ZESTORETIC) 10-12.5 MG tablet, Take 1 tablet by mouth daily., Disp: 90 tablet, Rfl: 1 .  potassium chloride (KLOR-CON) 10 MEQ tablet, TAKE ONE TABLET BY MOUTH ONCE DAILY., Disp: 90 tablet, Rfl: 1 .  traZODone (DESYREL) 50 MG tablet, Take 1 tablet (50 mg total) by mouth at bedtime., Disp: 30 tablet, Rfl: 3 .  aspirin EC 81 MG tablet, Take 1 tablet (81 mg total) by mouth daily before breakfast., Disp: 30 tablet, Rfl: 5 .  benztropine (COGENTIN) 1 MG tablet, Take 1-2 tablets (1-2 mg total) by mouth 2 (two) times daily. One tablet (1mg ) in the morning and two tablets (2mg ) at bedtime, Disp: 120  tablet, Rfl: 2 .  levETIRAcetam (KEPPRA) 500 MG tablet, Take 1 tablet (500 mg total) by mouth 2 (two) times daily., Disp: 60 tablet, Rfl: 1   Allergies  Allergen Reactions  . Percocet [Oxycodone-Acetaminophen] Diarrhea, Nausea And Vomiting and Nausea Only  . Tramadol Hcl Diarrhea, Nausea And Vomiting and Nausea Only  . Vicodin [Hydrocodone-Acetaminophen] Diarrhea, Nausea And Vomiting and Nausea Only     Social History   Tobacco Use  . Smoking status: Never Smoker  .  Smokeless tobacco: Never Used  Vaping Use  . Vaping Use: Never used  Substance Use Topics  . Alcohol use: No    Alcohol/week: 0.0 standard drinks  . Drug use: No      Chart Review Today: I personally reviewed active problem list, medication list, allergies, family history, social history, health maintenance, notes from last encounter, lab results, imaging with the patient/caregiver today.  Review of Systems  Unable to perform ROS: Other  Constitutional: Negative.   HENT: Negative.   Eyes: Negative.   Respiratory: Negative.   Cardiovascular: Negative.   Gastrointestinal: Negative.   Endocrine: Negative.   Genitourinary: Negative.   Musculoskeletal: Negative.   Skin: Negative.   Allergic/Immunologic: Negative.   Neurological: Negative.   Hematological: Negative.   Psychiatric/Behavioral: Negative.   All other systems reviewed and are negative.  10 Systems reviewed and are negative for acute change except as noted in the HPI.     Objective:   Vitals:   08/08/20 1411  BP: 122/82  Pulse: 98  Resp: 18  Temp: 97.9 F (36.6 C)  TempSrc: Oral  SpO2: 98%  Weight: 225 lb 4.8 oz (102.2 kg)  Height: 5\' 4"  (1.626 m)    Body mass index is 38.67 kg/m.  Physical Exam Vitals and nursing note reviewed.  Constitutional:      General: She is not in acute distress.    Appearance: She is obese. She is not toxic-appearing.  HENT:     Head: Normocephalic and atraumatic.  Eyes:     General:        Right eye: No discharge.        Left eye: No discharge.  Cardiovascular:     Rate and Rhythm: Normal rate and regular rhythm.     Pulses: Normal pulses.     Heart sounds: Normal heart sounds.  Pulmonary:     Effort: Pulmonary effort is normal.  Neurological:     Mental Status: She is alert.     Motor: Tremor present.  Psychiatric:        Attention and Perception: She is inattentive.        Mood and Affect: Mood and affect normal.        Speech: Speech is delayed and slurred.         Cognition and Memory: She exhibits impaired remote memory.      Results for orders placed or performed in visit on 04/24/20  CBC with Differential/Platelet  Result Value Ref Range   WBC 4.5 3.8 - 10.8 Thousand/uL   RBC 3.87 3.80 - 5.10 Million/uL   Hemoglobin 12.2 11.7 - 15.5 g/dL   HCT 36.1 35 - 45 %   MCV 93.3 80.0 - 100.0 fL   MCH 31.5 27.0 - 33.0 pg   MCHC 33.8 32.0 - 36.0 g/dL   RDW 13.2 11.0 - 15.0 %   Platelets 209 140 - 400 Thousand/uL   MPV 11.0 7.5 - 12.5 fL   Neutro Abs 1,715 1,500 - 7,800  cells/uL   Lymphs Abs 2,192 850 - 3,900 cells/uL   Absolute Monocytes 405 200 - 950 cells/uL   Eosinophils Absolute 131 15.0 - 500.0 cells/uL   Basophils Absolute 59 0.0 - 200.0 cells/uL   Neutrophils Relative % 38.1 %   Total Lymphocyte 48.7 %   Monocytes Relative 9.0 %   Eosinophils Relative 2.9 %   Basophils Relative 1.3 %  COMPLETE METABOLIC PANEL WITH GFR  Result Value Ref Range   Glucose, Bld 122 (H) 65 - 99 mg/dL   BUN 17 7 - 25 mg/dL   Creat 0.77 0.50 - 0.99 mg/dL   GFR, Est Non African American 80 > OR = 60 mL/min/1.73m2   GFR, Est African American 93 > OR = 60 mL/min/1.4m2   BUN/Creatinine Ratio NOT APPLICABLE 6 - 22 (calc)   Sodium 141 135 - 146 mmol/L   Potassium 3.9 3.5 - 5.3 mmol/L   Chloride 106 98 - 110 mmol/L   CO2 28 20 - 32 mmol/L   Calcium 9.4 8.6 - 10.4 mg/dL   Total Protein 6.7 6.1 - 8.1 g/dL   Albumin 4.0 3.6 - 5.1 g/dL   Globulin 2.7 1.9 - 3.7 g/dL (calc)   AG Ratio 1.5 1.0 - 2.5 (calc)   Total Bilirubin 0.7 0.2 - 1.2 mg/dL   Alkaline phosphatase (APISO) 57 37 - 153 U/L   AST 15 10 - 35 U/L   ALT 14 6 - 29 U/L  Lipid panel  Result Value Ref Range   Cholesterol 170 <200 mg/dL   HDL 41 (L) > OR = 50 mg/dL   Triglycerides 119 <150 mg/dL   LDL Cholesterol (Calc) 107 (H) mg/dL (calc)   Total CHOL/HDL Ratio 4.1 <5.0 (calc)   Non-HDL Cholesterol (Calc) 129 <130 mg/dL (calc)  Microalbumin / creatinine urine ratio  Result Value Ref Range    Creatinine, Urine 128 20 - 275 mg/dL   Microalb, Ur 0.4 mg/dL   Microalb Creat Ratio 3 <30 mcg/mg creat  POCT HgB A1C  Result Value Ref Range   Hemoglobin A1C 6.2 (A) 4.0 - 5.6 %   HbA1c POC (<> result, manual entry)     HbA1c, POC (prediabetic range)     HbA1c, POC (controlled diabetic range)         Assessment & Plan:   1. Right shoulder pain, unspecified chronicity  Patient is a very difficult historian she has trouble answering questions and giving relevant history, she has poor recall and speech is very slowed and slurred and she is here by herself and patient is new to me  she complains of right shoulder pain and believes it is related to her Covid injection however this was done in the other arm I have reviewed her chart and past office visits it is noted by her PCP that she has a history of chronic shoulder pain in the past I can only see an x-ray from a few years ago however in March of this year her PCP did discuss this and assess this and put in referrals for the patient  She has fairly good range of motion only slight difficulty with abduction, no new injury, no signs of infection or inflammatory arthritis no erythema or effusion noted.  Encouraged her to do supportive and symptomatic management -wrote out for her instructions on over-the-counter medication she can try and routine care of injuries with rest, ice (elevation and compression) encouraged to rest her arm comply ice to her deltoid area of it she  is having pain there  If her pain does not improve if that asked her to let us know so we could put in a referral to either physical therapy or to an orthopedic specialist At this time did not see indication for imaging, and defer that to specialist  Instructions to patient: You can try ibuprofen, aleve or naproxen over the counter as directed And can try tylenol 500-650 mg up to three times a day  If pain is not better in 2-3 weeks let us know and I will refer you to an  orthopedic specialist   [place] for Routine Care of Injuries       Delsa Grana, PA-C 08/08/20 2:21 PM

## 2020-08-08 NOTE — Patient Instructions (Addendum)
You can try ibuprofen, aleve or naproxen over the counter as directed And can try tylenol 500-650 mg up to three times a day  If pain is not better in 2-3 weeks let us know and I will refer you to an orthopedic specialist   RICE Therapy for Routine Care of Injuries Many injuries can be cared for with rest, ice, compression, and elevation (RICE therapy). This includes:  Resting the injured part.  Putting ice on the injury.  Putting pressure (compression) on the injury.  Raising the injured part (elevation). Using RICE therapy can help to lessen pain and swelling. Supplies needed:  Ice.  Plastic bag.  Towel.  Elastic bandage.  Pillow or pillows to raise (elevate) your injured body part. How to care for your injury with RICE therapy Rest Limit your normal activities, and try not to use the injured part of your body. You can go back to your normal activities when your doctor says it is okay to do them and you feel okay. Ask your doctor if you should do exercises to help your injury get better. Ice Put ice on the injured area. Do not put ice on your bare skin.  Put ice in a plastic bag.  Place a towel between your skin and the bag.  Leave the ice on for 20 minutes, 2-3 times a day. Use ice on as many days as told by your doctor.  Compression Compression means putting pressure on the injured area. This can be done with an elastic bandage. If an elastic bandage has been put on your injury:  Do not wrap the bandage too tight. Wrap the bandage more loosely if part of your body away from the bandage is blue, swollen, cold, painful, or loses feeling (gets numb).  Take off the bandage and put it on again. Do this every 3-4 hours or as told by your doctor.  See your doctor if the bandage seems to make your problems worse.  Elevation Elevation means keeping the injured area raised. If you can, raise the injured area above your heart or the center of your chest. Contact a doctor  if:  You keep having pain and swelling.  Your symptoms get worse. Get help right away if:  You have sudden bad pain at your injury or lower than your injury.  You have redness or more swelling around your injury.  You have tingling or numbness at your injury or lower than your injury, and it does not go away when you take off the bandage. Summary  Many injuries can be cared for using rest, ice, compression, and elevation (RICE therapy).  You can go back to your normal activities when you feel okay and your doctor says it is okay.  Put ice on the injured area as told by your doctor.  Get help if your symptoms get worse or if you keep having pain and swelling. This information is not intended to replace advice given to you by your health care provider. Make sure you discuss any questions you have with your health care provider. Document Revised: 05/16/2017 Document Reviewed: 05/16/2017 Elsevier Patient Education  [place].    Shoulder Pain Many things can cause shoulder pain, including:  An injury.  Moving the shoulder in the same way again and again (overuse).  Joint pain (arthritis). Pain can come from:  Swelling and irritation (inflammation) of any part of the shoulder.  An injury to the shoulder joint.  An injury to: ? Tissues that  connect muscle to bone (tendons). ? Tissues that connect bones to each other (ligaments). ? Bones. Follow these instructions at home: Watch for changes in your symptoms. Let your doctor know about them. Follow these instructions to help with your pain. If you have a sling:  Wear the sling as told by your doctor. Remove it only as told by your doctor.  Loosen the sling if your fingers: ? Tingle. ? Become numb. ? Turn cold and blue.  Keep the sling clean.  If the sling is not waterproof: ? Do not let it get wet. ? Take the sling off when you shower or bathe. Managing pain, stiffness, and swelling   If told, put ice  on the painful area: ? Put ice in a plastic bag. ? Place a towel between your skin and the bag. ? Leave the ice on for 20 minutes, 2-3 times a day. Stop putting ice on if it does not help with the pain.  Squeeze a soft ball or a foam pad as much as possible. This prevents swelling in the shoulder. It also helps to strengthen the arm. General instructions  Take over-the-counter and prescription medicines only as told by your doctor.  Keep all follow-up visits as told by your doctor. This is important. Contact a doctor if:  Your pain gets worse.  Medicine does not help your pain.  You have new pain in your arm, hand, or fingers. Get help right away if:  Your arm, hand, or fingers: ? Tingle. ? Are numb. ? Are swollen. ? Are painful. ? Turn white or blue. Summary  Shoulder pain can be caused by many things. These include injury, moving the shoulder in the same away again and again, and joint pain.  Watch for changes in your symptoms. Let your doctor know about them.  This condition may be treated with a sling, ice, and pain medicine.  Contact your doctor if the pain gets worse or you have new pain. Get help right away if your arm, hand, or fingers tingle or get numb, swollen, or painful.  Keep all follow-up visits as told by your doctor. This is important. This information is not intended to replace advice given to you by your health care provider. Make sure you discuss any questions you have with your health care provider. Document Revised: 03/10/2018 Document Reviewed: 03/10/2018 Elsevier Patient Education  [place].

## 2020-08-09 NOTE — Chronic Care Management (AMB) (Signed)
  Chronic Care Management   Note   Name: Lauren [name] MRN: [id number] DOB: July 09, 1951   Brief outreach with [name]. Reports increased pain to her shoulders. She denies recent falls or injuries. She denies need for urgent medical follow up but requesting an office visit for further evaluation. Requesting to see any available provider.   Follow up plan: Provider visit scheduled for 08/08/20. [name] is agreeable to care management outreach within the next two weeks.  [name] Care Management [facility] [phone]

## 2020-08-11 ENCOUNTER — Telehealth: Payer: Self-pay

## 2020-08-11 NOTE — Telephone Encounter (Signed)
Error Please Disregard

## 2020-08-11 NOTE — Telephone Encounter (Signed)
  Chronic Care Management   Outreach Note  08/11/2020 Name: Jaylenn Baiza MRN: [id number] DOB: [birth year]  Primary Care Provider: Steele Sizer, MD Reason for referral : Chronic Care Management   An unsuccessful telephone outreach was attempted today. Ms. Tzeng is currently engaged with the chronic care management team.    Follow Up Plan:  A HIPAA compliant voice message was left today requesting a return call.    [name] Care Management [facility] [phone]

## 2020-08-23 DIAGNOSIS — G2401 Drug induced subacute dyskinesia: Secondary | ICD-10-CM | POA: Diagnosis not present

## 2020-08-30 ENCOUNTER — Ambulatory Visit: Payer: Medicare Other | Admitting: Family Medicine

## 2020-09-06 ENCOUNTER — Encounter: Payer: Self-pay | Admitting: Family Medicine

## 2020-09-20 ENCOUNTER — Telehealth: Payer: Self-pay

## 2020-09-20 NOTE — Telephone Encounter (Signed)
  Chronic Care Management   Outreach Note  09/20/2020 Name: Lauren [name] MRN: [id number] DOB: [birth year]  Primary Care Provider: Steele Sizer, MD Reason for referral : Chronic Care Management   An unsuccessful telephone outreach was attempted today. [name] is currently enrolled in the chronic care management program.     Follow Up Plan:  A HIPAA compliant voice message was left today requesting a return call.    [name] Care Management [facility] [phone]

## 2020-09-22 DIAGNOSIS — G2401 Drug induced subacute dyskinesia: Secondary | ICD-10-CM | POA: Diagnosis not present

## 2020-10-19 ENCOUNTER — Other Ambulatory Visit: Payer: Self-pay | Admitting: Family Medicine

## 2020-10-19 NOTE — Telephone Encounter (Signed)
Medication Refill - Medication: Lexapro, Gabapentin, trazodone   Has the patient contacted their pharmacy? Yes.  Lauren [name] calling stating that the pharmacy is needing to have a new prescription for these medications. Please advise.  (Agent: If no, request that the patient contact the pharmacy for the refill.) (Agent: If yes, when and what did the pharmacy advise?)  Preferred Pharmacy (with phone number or street name):  [facility], [place] [address]  [place] [address]  Phone: [phone] Fax: [phone]  Hours: Not open 24 hours     Agent: Please be advised that RX refills may take up to 3 business days. We ask that you follow-up with your pharmacy.

## 2020-10-19 NOTE — Telephone Encounter (Signed)
Requested medication (s) are due for refill today: Trazadone, yes  Requested medication (s) are on the active medication list: yes}  Last refill:  03/27/20  Future visit scheduled: yes  Notes to clinic:  historical med; Blake Divine, MD   Requested medication (s) are due for refill today: Gabapentin,yes  Requested medication (s) are on the active medication list: yes  Last refill:  05/23/20  Future visit scheduled: yes  Notes to clinic: historical provider, Blake Divine, MD   Requested medication (s) are due for refill today: Escitalopram  Requested medication (s) are on the active medication list: yes  Last refill: 02/01/20  Future visit scheduled: yes  Notes to clinic:  historical provider, Blake Divine, MD        Requested Prescriptions  Pending Prescriptions Disp Refills   traZODone (DESYREL) 50 MG tablet 30 tablet 3    Sig: Take 1 tablet (50 mg total) by mouth at bedtime.      Psychiatry: Antidepressants - Serotonin Modulator Passed - 10/19/2020  1:45 PM      Passed - Valid encounter within last 6 months    Recent Outpatient Visits           2 months ago Right shoulder pain, unspecified chronicity   [facility] [place], Kristeen Miss, PA-C   5 months ago Type 2 diabetes mellitus with obesity [facility])   [facility] [place], Drue Stager, MD   10 months ago Diabetes mellitus type 2 in obese [facility])   [facility] Steele Sizer, MD   1 year ago Paranoid schizophrenia [facility])   [facility] Steele Sizer, MD   1 year ago Paranoid schizophrenia [facility])   [facility] Steele Sizer, MD       Future Appointments             In 6 days Steele Sizer, MD [facility]               gabapentin (NEURONTIN) 100 MG capsule 180 capsule 1    Sig: Take 1 capsule (100 mg total) by mouth 2 (two) times daily.      Neurology: Anticonvulsants - gabapentin  Passed - 10/19/2020  1:45 PM      Passed - Valid encounter within last 12 months    Recent Outpatient Visits           2 months ago Right shoulder pain, unspecified chronicity   [facility] [place], Kristeen Miss, PA-C   5 months ago Type 2 diabetes mellitus with obesity [facility])   [facility] [place], Drue Stager, MD   10 months ago Diabetes mellitus type 2 in obese [facility])   [facility] Steele Sizer, MD   1 year ago Paranoid schizophrenia [facility])   [facility] Steele Sizer, MD   1 year ago Paranoid schizophrenia [facility])   [facility] Steele Sizer, MD       Future Appointments             In 6 days Steele Sizer, MD [facility]               escitalopram (LEXAPRO) 10 MG tablet 90 tablet 1    Sig: Take 1 tablet (10 mg total) by mouth daily.      Psychiatry:  Antidepressants - SSRI Passed - 10/19/2020  1:45 PM      Passed - Valid encounter within last 6  months    Recent Outpatient Visits           2 months ago Right shoulder pain, unspecified chronicity   [facility] [place], [place], PA-C   5 months ago Type 2 diabetes mellitus with obesity [facility])   [facility] [place], Drue Stager, MD   10 months ago Diabetes mellitus type 2 in obese [facility])   [facility] Steele Sizer, MD   1 year ago Paranoid schizophrenia [facility])   [facility] Steele Sizer, MD   1 year ago Paranoid schizophrenia [facility])   [facility] Steele Sizer, MD       Future Appointments             In 6 days Steele Sizer, MD [facility]

## 2020-10-20 ENCOUNTER — Other Ambulatory Visit: Payer: Self-pay

## 2020-10-24 ENCOUNTER — Ambulatory Visit: Payer: Self-pay | Admitting: *Deleted

## 2020-10-24 NOTE — Telephone Encounter (Signed)
Pt called stating she is having shortness of breath for 6-8 months; she was previously told to "breath in a bag" but that is not working; the pt says she has anxiety; this happens 4-5 times daily; the pt also says she has "soreness in the joints" since her COVID vaccines and booster and flu shost in November 2021; recommendations made per nurse triage protocol; the pt is scheduled to see Dr Ancil Boozer at [facility] on 10/26/19 at 1400; will route to office for notification of encounter  Reason for Disposition . [1] MODERATE longstanding difficulty breathing (e.g., speaks in phrases, SOB even at rest, pulse 100-120) AND [2] SAME as normal  Answer Assessment - Initial Assessment Questions 1. RESPIRATORY STATUS: "Describe your breathing?" (e.g., wheezing, shortness of breath, unable to speak, severe coughing)      Short of breath 2. ONSET: "When did this breathing problem begin?"     6-8 months ago 3. PATTERN "Does the difficult breathing come and go, or has it been constant since it started?"    6-8 months 4. SEVERITY: "How bad is your breathing?" (e.g., mild, moderate, severe)    - MILD: No SOB at rest, mild SOB with walking, speaks normally in sentences, can lay down, no retractions, pulse < 100.    - MODERATE: SOB at rest, SOB with minimal exertion and prefers to sit, cannot lie down flat, speaks in phrases, mild retractions, audible wheezing, pulse 100-120.    - SEVERE: Very SOB at rest, speaks in single words, struggling to breathe, sitting hunched forward, retractions, pulse > 120     Mild to moderate 5. RECURRENT SYMPTOM: "Have you had difficulty breathing before?" If Yes, ask: "When was the last time?" and "What happened that time?"      Yes, with anxiety 6. CARDIAC HISTORY: "Do you have any history of heart disease?" (e.g., heart attack, angina, bypass surgery, angioplasty)     HTN 7. LUNG HISTORY: "Do you have any history of lung disease?"  (e.g., pulmonary embolus, asthma,  emphysema)    "Lung operation"removed left upper lobe in 1977 8. CAUSE: "What do you think is causing the breathing problem?"     anxiety 9. OTHER SYMPTOMS: "Do you have any other symptoms? (e.g., dizziness, runny nose, cough, chest pain, fever)    Hands start shaking 10. PREGNANCY: "Is there any chance you are pregnant?" "When was your last menstrual period?"      no 11. TRAVEL: "Have you traveled out of the country in the last month?" (e.g., travel history, exposures)       no  Protocols used: BREATHING DIFFICULTY-A-AH

## 2020-10-24 NOTE — Progress Notes (Unsigned)
Name: Lauren [name]   MRN: [id number]    DOB: Jul 30, 1951   Date:10/25/2020       Progress Note  Subjective  Chief Complaint  Follow up   HPI   SOB/Tachypenia: seen by Dr. Lanney [name], he felt like symptoms likely from extra pyramidal effects. Her breathing has been stable when compared to previous visit, has intermittent tachypnea.   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 She states having transportation problems.   Schizoaffective disorder: she has been doing well on Haldolevery two weeks, she states nurse Lauren Liberty NP) goes to her house to give her the injections. She is now living in  [place] with her cousin - Lauren [name] Her cousin Lauren [name] manages her money and paying her bills from now on.  She denies visual or auditory hallucinations   Recurrence of Meningioma: resection done in 2012 with developed of left side numbness, she went to [facility] and CT showed recurrence 01/2018 . She saw Dr. Lacinda [name] but she did not want to have repeat surgery therefore seen by Dr. Baruch [name] and s/ p radiation.Shedenies headaches, nausea, vomiting, problems with her vision but states she states tremors are intermittent now. History of seizures, taking Keppra and no symptoms in a long time. She cancelled last visit with Dr. Lacinda [name]   Aorta atherosclerosis: found on CT chest done Nov 2018. She is on statin and aspirin daily, but LDL was not at goal, we will change from Atorvastatin to Crestor 40 mg .No side effects of medication. We will recheck lipid panel next visit   DMII:  taking metforminand last A1C today is down to 5.7 %  Eye exam is up to date. She has polyphagia, polydipsia and polyuria.On statin therapy but last LDL not at goal, discussed adding zetia or switching to Crestor and she chose the later   OZD:GUYQI now on lisinopriland HCTZ 12.5 mg, denies chest pain or  palpitation. BP is at goal today , however she states the diuretic causes her incontinence, worse in the morning in am's unable to make to the bathroom on time, we will dc hctz and change from ACE to ARB at higher dose   Morbid obesity: she has BMI above 35 with co-morbidities, such as HTN and DM. Discussed diet again with patient   OA: she right knee pain when she gets up in am and when moving around, no pain at rest,  she is doing exercises while sitting, no pain at this time but uses a cane to help with ambulation    Patient Active Problem List   Diagnosis Date Noted  . 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 SS-A antibody positive 04/15/2019  . Slurred speech 03/04/2019  . Chest pain in adult 02/15/2019  . Seizure-like activity ([place]) 12/29/2018  . Seizures ([place]) 11/24/2018  . Paresthesias 11/22/2018  . Mouth dryness 10/12/2018  . Hx of resection of meningioma 07/21/2018  . Tremor 07/21/2018  . Morbid obesity ([place]) 12/09/2017  . Meningioma ([place]) 11/25/2017  . Atherosclerosis of aorta ([place]) 11/25/2017  . Calcification of coronary artery 11/25/2017  . Schizophrenia ([place]) 11/04/2017  . Acid reflux 06/29/2015  . Diabetes mellitus type 2, controlled, without complications ([place]) 34/74/2595  . Cardiac murmur 06/29/2015  . Arthritis of knee, degenerative 06/28/2015  . Insomnia w/ sleep apnea 03/21/2015  . Anxiety disorder 03/21/2015  . Hypertension 03/21/2015  . HLD (hyperlipidemia) 03/21/2015  .  Urge incontinence 03/21/2015    Past Surgical History:  Procedure Laterality Date  . BRAIN TUMOR EXCISION  2012   benign  . CARDIAC CATHETERIZATION  2008  . CESAREAN SECTION    . CYST REMOVAL HAND    . LUNG LOBECTOMY  1977   benign tumor  . TOTAL KNEE ARTHROPLASTY Left 01/28/2017   Procedure: TOTAL KNEE ARTHROPLASTY;  Surgeon: Lauren Mull, MD;  Location: [facility] ORS;  Service: Orthopedics;  Laterality: Left;    Family History  Problem Relation Age of Onset  . Heart disease Brother   .  Depression Mother   . Heart attack Mother   . Stroke Mother   . Alcohol abuse Father   . Stroke Father   . Diabetes Sister   . Diabetes Sister   . Stomach cancer Sister   . Kidney disease Sister   . COPD Brother   . Lung cancer Brother   . Diabetes Brother     Social History   Tobacco Use  . Smoking status: Never Smoker  . Smokeless tobacco: Never Used  Substance Use Topics  . Alcohol use: No    Alcohol/week: 0.0 standard drinks     Current Outpatient Medications:  .  aspirin EC 81 MG tablet, Take 1 tablet (81 mg total) by mouth daily before breakfast., Disp: 30 tablet, Rfl: 5 .  escitalopram (LEXAPRO) 10 MG tablet, Take 1 tablet (10 mg total) by mouth daily., Disp: 90 tablet, Rfl: 1 .  gabapentin (NEURONTIN) 100 MG capsule, Take 1 capsule (100 mg total) by mouth 2 (two) times daily., Disp: 180 capsule, Rfl: 1 .  haloperidol decanoate (HALDOL DECANOATE) 100 MG/ML injection, Inject 100 mg into the muscle every 28 (twenty-eight) days. , Disp: , Rfl:  .  rosuvastatin (CRESTOR) 40 MG tablet, Take 1 tablet (40 mg total) by mouth daily., Disp: 90 tablet, Rfl: 1 .  traZODone (DESYREL) 50 MG tablet, Take 1 tablet (50 mg total) by mouth at bedtime., Disp: 30 tablet, Rfl: 3 .  valsartan (DIOVAN) 160 MG tablet, Take 1 tablet (160 mg total) by mouth daily., Disp: 90 tablet, Rfl: 0 .  benztropine (COGENTIN) 1 MG tablet, Take 1-2 tablets (1-2 mg total) by mouth 2 (two) times daily. One tablet (1mg ) in the morning and two tablets (2mg ) at bedtime, Disp: 120 tablet, Rfl: 2 .  levETIRAcetam (KEPPRA) 500 MG tablet, Take 1 tablet (500 mg total) by mouth 2 (two) times daily., Disp: 60 tablet, Rfl: 1  Allergies  Allergen Reactions  . Percocet [Oxycodone-Acetaminophen] Diarrhea, Nausea And Vomiting and Nausea Only  . Tramadol Hcl Diarrhea, Nausea And Vomiting and Nausea Only  . Vicodin [Hydrocodone-Acetaminophen] Diarrhea, Nausea And Vomiting and Nausea Only    I personally reviewed active  problem list, medication list, allergies, family history, social history, health maintenance with the patient/caregiver today.   ROS  Constitutional: Negative for fever or weight change.  Respiratory: Negative for cough and shortness of breath.   Cardiovascular: Negative for chest pain or palpitations.  Gastrointestinal: Negative for abdominal pain, no bowel changes.  Musculoskeletal: positive for gait problem but no  joint swelling.  Skin: Negative for rash.  Neurological: Negative for dizziness or headache.  No other specific complaints in a complete review of systems (except as listed in HPI above).  Objective  Vitals:   10/25/20 1349  BP: 120/72  Pulse: 100  Resp: 17  Temp: 97.9 F (36.6 C)  TempSrc: Oral  SpO2: 97%  Weight: 226 lb (102.5 kg)  Height: 5\' 4"  (1.626 m)    Body mass index is 38.79 kg/m.  Physical Exam  Constitutional: Patient appears well-developed and well-nourished. Obese  No distress.  HEENT: head atraumatic, normocephalic, pupils equal and reactive to light,  neck supple Cardiovascular: Normal rate, regular rhythm and normal heart sounds.  No murmur heard. No BLE edema. Pulmonary/Chest: Effort normal and breath sounds normal. No respiratory distress. Abdominal: Soft.  There is no tenderness. Neurological: dysarthria, some tremors, irregular breathing  Psychiatric: Patient has a normal mood and affect. behavior is normal. Judgment and thought content normal.  Recent Results (from the past 2160 hour(s))  POCT HgB A1C     Status: Abnormal   Collection Time: 10/25/20  2:00 PM  Result Value Ref Range   Hemoglobin A1C 5.7 (A) 4.0 - 5.6 %   HbA1c POC (<> result, manual entry)     HbA1c, POC (prediabetic range)     HbA1c, POC (controlled diabetic range)        PHQ2/9: Depression screen [facility] 2/9 10/25/2020 08/08/2020 12/06/2019 11/24/2019 10/26/2019  Decreased Interest 0 0 0 0 0  Down, Depressed, Hopeless 0 0 0 0 0  PHQ - 2 Score 0 0 0 0 0  Altered  sleeping - - 0 - 2  Tired, decreased energy - - 0 - 1  Change in appetite - - 0 - 1  Feeling bad or failure about yourself  - - 0 - 0  Trouble concentrating - - 0 - 2  Moving slowly or fidgety/restless - - 0 - 1  Suicidal thoughts - - 0 - 0  PHQ-9 Score - - 0 - 7  Difficult doing work/chores - - - - Somewhat difficult  Some recent data might be hidden    phq 9 is negative   Fall Risk: Fall Risk  10/25/2020 08/08/2020 04/24/2020 11/24/2019 10/26/2019  Falls in the past year? 0 1 - 0 0  Number falls in past yr: 0 1 - - 0  Injury with Fall? 0 0 - - 0  Risk for fall due to : - History of fall(s);Mental status change;Impaired balance/gait;Impaired mobility Impaired balance/gait;History of fall(s) Impaired balance/gait;Medication side effect No Fall Risks  Follow up - Falls evaluation completed - Falls prevention discussed Falls prevention discussed     Functional Status Survey: Is the patient deaf or have difficulty hearing?: Yes Does the patient have difficulty seeing, even when wearing glasses/contacts?: No Does the patient have difficulty concentrating, remembering, or making decisions?: Yes Does the patient have difficulty walking or climbing stairs?: Yes Does the patient have difficulty dressing or bathing?: No Does the patient have difficulty doing errands alone such as visiting a doctor's office or shopping?: Yes    Assessment & Plan  1. Type 2 diabetes mellitus with obesity (HCC)  - POCT HgB A1C  2. Morbid obesity ([place])  Discussed with the patient the risk posed by an increased BMI. Discussed importance of portion control, calorie counting and at least 150 minutes of physical activity weekly. Avoid sweet beverages and drink more water. Eat at least 6 servings of fruit and vegetables daily   3. Meningioma ([place])  Needs follow up with Dr. Lacinda [name]   4. Essential hypertension  - valsartan (DIOVAN) 160 MG tablet; Take 1 tablet (160 mg total) by mouth daily.  Dispense: 90  tablet; Refill: 0  5. Schizophrenia, paranoid ([place])  Compliant with medication   6. Atherosclerosis of aorta ([place])  She is going to start Crestor  7. Seizure-like activity (  [place])  Controlled with medication  8. OSA (obstructive sleep apnea)  She is not sure where it is , she is willing to do another sleep study   9. Irregular breathing pattern  Stable   10. Dyslipidemia associated with type 2 diabetes mellitus (HCC)  Change cholesterol medication   11. IgA monoclonal gammopathy of uncertain significance   12. Dysarthria   13. Abnormal urine odor  - CULTURE, URINE COMPREHENSIVE

## 2020-10-25 ENCOUNTER — Ambulatory Visit (INDEPENDENT_AMBULATORY_CARE_PROVIDER_SITE_OTHER): Payer: Medicare Other | Admitting: Family Medicine

## 2020-10-25 ENCOUNTER — Telehealth: Payer: Self-pay | Admitting: Family Medicine

## 2020-10-25 ENCOUNTER — Encounter: Payer: Self-pay | Admitting: Family Medicine

## 2020-10-25 ENCOUNTER — Other Ambulatory Visit: Payer: Self-pay

## 2020-10-25 VITALS — BP 120/72 | HR 100 | Temp 97.9°F | Resp 17 | Ht 64.0 in | Wt 226.0 lb

## 2020-10-25 DIAGNOSIS — R569 Unspecified convulsions: Secondary | ICD-10-CM

## 2020-10-25 DIAGNOSIS — R471 Dysarthria and anarthria: Secondary | ICD-10-CM | POA: Diagnosis not present

## 2020-10-25 DIAGNOSIS — I700 Atherosclerosis of aorta: Secondary | ICD-10-CM | POA: Diagnosis not present

## 2020-10-25 DIAGNOSIS — E669 Obesity, unspecified: Secondary | ICD-10-CM

## 2020-10-25 DIAGNOSIS — R0689 Other abnormalities of breathing: Secondary | ICD-10-CM | POA: Diagnosis not present

## 2020-10-25 DIAGNOSIS — D472 Monoclonal gammopathy: Secondary | ICD-10-CM

## 2020-10-25 DIAGNOSIS — G4733 Obstructive sleep apnea (adult) (pediatric): Secondary | ICD-10-CM

## 2020-10-25 DIAGNOSIS — D329 Benign neoplasm of meninges, unspecified: Secondary | ICD-10-CM | POA: Diagnosis not present

## 2020-10-25 DIAGNOSIS — E785 Hyperlipidemia, unspecified: Secondary | ICD-10-CM

## 2020-10-25 DIAGNOSIS — E1169 Type 2 diabetes mellitus with other specified complication: Secondary | ICD-10-CM | POA: Diagnosis not present

## 2020-10-25 DIAGNOSIS — I10 Essential (primary) hypertension: Secondary | ICD-10-CM

## 2020-10-25 DIAGNOSIS — R8290 Unspecified abnormal findings in urine: Secondary | ICD-10-CM

## 2020-10-25 DIAGNOSIS — F200 Paranoid schizophrenia: Secondary | ICD-10-CM

## 2020-10-25 LAB — POCT GLYCOSYLATED HEMOGLOBIN (HGB A1C): Hemoglobin A1C: 5.7 % — AB (ref 4.0–5.6)

## 2020-10-25 MED ORDER — ROSUVASTATIN CALCIUM 40 MG PO TABS: 40.0000 mg | ORAL_TABLET | Freq: Every day | ORAL | 1 refills | Status: DC

## 2020-10-25 MED ORDER — VALSARTAN 160 MG PO TABS: 160.0000 mg | ORAL_TABLET | Freq: Every day | ORAL | 0 refills | Status: DC

## 2020-10-25 NOTE — Telephone Encounter (Signed)
Copied from [place] [id number]. Topic: Medicare AWV >> Oct 25, 2020 10:24 AM Cher Nakai R wrote: Reason for CRM:   Left message for patient to call back and schedule Medicare Annual Wellness Visit (AWV) in office.   If unable to come into the office for AWV,  please offer to do virtually or by telephone.  Last AWV: 10/26/2019  Please schedule at anytime with [place].  40 minute appointment  Any questions, please contact me at [phone]

## 2020-10-25 NOTE — Telephone Encounter (Signed)
Copied from [place] [id number]. Topic: Medicare AWV >> Oct 25, 2020 10:24 AM Cher Nakai R wrote: Reason for CRM:   Left message for patient to call back and schedule Medicare Annual Wellness Visit (AWV) in office.   If unable to come into the office for AWV,  please offer to do virtually or by telephone.  Last AWV: 10/26/2019  Please schedule at anytime with [place].  40 minute appointment  Any questions, please contact me at [phone]

## 2020-10-27 ENCOUNTER — Ambulatory Visit: Payer: Self-pay | Admitting: *Deleted

## 2020-10-27 ENCOUNTER — Emergency Department (HOSPITAL_COMMUNITY)
Admission: EM | Admit: 2020-10-27 | Discharge: 2020-10-27 | Disposition: A | Discharge status: Home / Self Care | Payer: Medicare Other | Attending: Emergency Medicine | Admitting: Emergency Medicine

## 2020-10-27 ENCOUNTER — Other Ambulatory Visit: Payer: Self-pay

## 2020-10-27 ENCOUNTER — Encounter (HOSPITAL_COMMUNITY): Payer: Self-pay | Admitting: Emergency Medicine

## 2020-10-27 ENCOUNTER — Emergency Department (HOSPITAL_COMMUNITY): Payer: Medicare Other

## 2020-10-27 DIAGNOSIS — E119 Type 2 diabetes mellitus without complications: Secondary | ICD-10-CM | POA: Diagnosis not present

## 2020-10-27 DIAGNOSIS — W461XXA Contact with contaminated hypodermic needle, initial encounter: Secondary | ICD-10-CM | POA: Insufficient documentation

## 2020-10-27 DIAGNOSIS — Z743 Need for continuous supervision: Secondary | ICD-10-CM | POA: Diagnosis not present

## 2020-10-27 DIAGNOSIS — T07XXXA Unspecified multiple injuries, initial encounter: Secondary | ICD-10-CM | POA: Diagnosis not present

## 2020-10-27 DIAGNOSIS — M1611 Unilateral primary osteoarthritis, right hip: Secondary | ICD-10-CM | POA: Diagnosis not present

## 2020-10-27 DIAGNOSIS — Z79899 Other long term (current) drug therapy: Secondary | ICD-10-CM | POA: Insufficient documentation

## 2020-10-27 DIAGNOSIS — Z7982 Long term (current) use of aspirin: Secondary | ICD-10-CM | POA: Insufficient documentation

## 2020-10-27 DIAGNOSIS — Z711 Person with feared health complaint in whom no diagnosis is made: Secondary | ICD-10-CM

## 2020-10-27 DIAGNOSIS — Z96652 Presence of left artificial knee joint: Secondary | ICD-10-CM | POA: Diagnosis not present

## 2020-10-27 DIAGNOSIS — S70251A Superficial foreign body, right hip, initial encounter: Secondary | ICD-10-CM | POA: Insufficient documentation

## 2020-10-27 DIAGNOSIS — I10 Essential (primary) hypertension: Secondary | ICD-10-CM | POA: Diagnosis not present

## 2020-10-27 DIAGNOSIS — M25551 Pain in right hip: Secondary | ICD-10-CM | POA: Diagnosis not present

## 2020-10-27 DIAGNOSIS — Z0389 Encounter for observation for other suspected diseases and conditions ruled out: Secondary | ICD-10-CM | POA: Diagnosis not present

## 2020-10-27 LAB — CULTURE, URINE COMPREHENSIVE
MICRO NUMBER:: 11545979
SPECIMEN QUALITY:: ADEQUATE

## 2020-10-27 MED ORDER — LORAZEPAM 1 MG PO TABS
1.0000 mg | ORAL_TABLET | Freq: Once | ORAL | Status: AC
  Administered 2020-10-27: 1 mg via ORAL
  Filled 2020-10-27: qty 1

## 2020-10-27 NOTE — ED Triage Notes (Signed)
Pt was receiving haldol shot in thigh from home health psych nurse. Nurses stated to EMS that she heard a "pop" in her thigh, medication ran out of leg, and unable to locate needle.   EMS examined thigh, pt denies any pain in thigh at this time.

## 2020-10-27 NOTE — ED Notes (Signed)
Spoke with Pt family member, [name] is on way to pick up pt- says she will let registration know when she gets here.

## 2020-10-27 NOTE — ED Notes (Signed)
Pt in bed, pt states that she is here because she got a haldol injection and believes that the needle broke off and is in her R hip, no wound noted, pt denies pain at this time.

## 2020-10-27 NOTE — Discharge Instructions (Addendum)
Your xray does not show any evidence for a retained needle in your right hip.

## 2020-10-27 NOTE — Telephone Encounter (Signed)
Patient is calling to report she had her Haldol injection today and the NP giving it to her stated the needle broke- she thinks the needle broke in her skin. Sdvsied patient that it would be best to have that checked. Advised ED- she states she does not have transportation. After trying to list family who can help her with transportation- she is going to reach out to her cousin and see if she can help her get to the ED for evaluation.  Reason for Disposition . Deeply embedded FB  (e.g., needle or toothpick in foot)  Answer Assessment - Initial Assessment Questions 1. MECHANISM: "How did it happen?"      Patient states she was told the needle broke off in skin 2. LOCATION: "Where is the FB (e.g., splinter) located?"      R hip 3. OBJECT: "What type of FB was it?"      needle 4. DEPTH: "How deep do you think the FB goes?"      Not sure how much of needle 5. ONSET: "When did the injury occur?" (Minutes or hours)      today 6. PAIN: "Is it painful?" If Yes, ask: "How bad is the pain?"  (Scale 1-10; or mild, moderate, severe)     Not having pain  Protocols used: SKIN FOREIGN BODY-A-AH

## 2020-10-27 NOTE — ED Notes (Signed)
Pt ambulatory to bathroom with cane.

## 2020-10-29 NOTE — ED Provider Notes (Signed)
[facility] EMERGENCY DEPARTMENT Provider Note   CSN: [id number] Arrival date & time: 10/27/20  1551     History Chief Complaint  Patient presents with  . Foreign Body in [place] is a 70 y.o. female presenting for evaluation of potential needle foreign body in her right hip region.  Her home health nurse came to give her a haldol injection.  She reports there was a snapping noise and then the medication ran down her skin. They were unable to find the needle after this event so is concerned about possible embedded needle.  She denies any pain at the injection site.  HPI     Past Medical History:  Diagnosis Date  . Anxiety   . Apnea, sleep 02/02/2014  . Awareness of heartbeats 02/02/2014  . Breathlessness on exertion 02/02/2014  . Diabetes mellitus   . Encounter for pre-employment examination 06/29/2015  . Excessive sweating 07/05/2015  . Fibromyalgia   . GERD (gastroesophageal reflux disease)   . Gravida 1 10/26/2015   1.    . Heart murmur   . Herniated disc   . Hyperlipidemia   . Hypertension   . Itch of skin 10/26/2015  . Lack of bladder control   . Lung tumor   . Rheumatoid arthritis ([place])   . Schizoaffective disorder ([place])   . Screening for cervical cancer 07/29/2017  . Seizure [facility])    after brain surgery 2012. last seizure 2013!  [name] Sex counseling 10/26/2015  . Sleep apnea   . 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 Stiffness of both knees 06/22/2015  . Stroke [facility])     Patient Active Problem List   Diagnosis Date Noted  . 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 SS-A antibody positive 04/15/2019  . Seizure-like activity ([place]) 12/29/2018  . Seizures ([place]) 11/24/2018  . Paresthesias 11/22/2018  . Mouth dryness 10/12/2018  . Hx of resection of meningioma 07/21/2018  . Tremor 07/21/2018  . Morbid obesity ([place]) 12/09/2017  . Meningioma ([place]) 11/25/2017  . Atherosclerosis of aorta ([place]) 11/25/2017  . Calcification of  coronary artery 11/25/2017  . Schizophrenia ([place]) 11/04/2017  . Acid reflux 06/29/2015  . Diabetes mellitus type 2, controlled, without complications ([place]) 17/51/0258  . Cardiac murmur 06/29/2015  . Arthritis of knee, degenerative 06/28/2015  . Insomnia w/ sleep apnea 03/21/2015  . Anxiety disorder 03/21/2015  . Hypertension 03/21/2015  . HLD (hyperlipidemia) 03/21/2015  . Urge incontinence 03/21/2015    Past Surgical History:  Procedure Laterality Date  . BRAIN TUMOR EXCISION  2012   benign  . CARDIAC CATHETERIZATION  2008  . CESAREAN SECTION    . CYST REMOVAL HAND    . LUNG LOBECTOMY  1977   benign tumor  . TOTAL KNEE ARTHROPLASTY Left 01/28/2017   Procedure: TOTAL KNEE ARTHROPLASTY;  Surgeon: Corky Mull, MD;  Location: [facility] ORS;  Service: Orthopedics;  Laterality: Left;     OB History   No obstetric history on file.     Family History  Problem Relation Age of Onset  . Heart disease Brother   . Depression Mother   . Heart attack Mother   . Stroke Mother   . Alcohol abuse Father   . Stroke Father   . Diabetes Sister   . Diabetes Sister   . Stomach cancer Sister   . Kidney disease Sister   . COPD Brother   . Lung cancer Brother   . Diabetes Brother  Social History   Tobacco Use  . Smoking status: Never Smoker  . Smokeless tobacco: Never Used  Vaping Use  . Vaping Use: Never used  Substance Use Topics  . Alcohol use: No    Alcohol/week: 0.0 standard drinks  . Drug use: No    Home Medications Prior to Admission medications   Medication Sig Start Date End Date Taking? Authorizing Provider  aspirin EC 81 MG tablet Take 1 tablet (81 mg total) by mouth daily before breakfast. 07/20/19  Yes Blake Divine, MD  atorvastatin (LIPITOR) 40 MG tablet Take 40 mg by mouth daily.   Yes [provider]  benztropine (COGENTIN) 1 MG tablet Take 1-2 tablets (1-2 mg total) by mouth 2 (two) times daily. One tablet (1mg ) in the morning and two tablets (2mg )  at bedtime Patient taking differently: Take 1 mg by mouth 3 (three) times daily. One tablet (1mg ) in the morning and two tablets (2mg ) at bedtime 07/20/19 10/18/19 Yes Blake Divine, MD  escitalopram (LEXAPRO) 10 MG tablet Take 1 tablet (10 mg total) by mouth daily. 07/20/19  Yes Blake Divine, MD  gabapentin (NEURONTIN) 100 MG capsule Take 1 capsule (100 mg total) by mouth 2 (two) times daily. 07/20/19  Yes Blake Divine, MD  haloperidol decanoate (HALDOL DECANOATE) 100 MG/ML injection Inject 100 mg into the muscle every 28 (twenty-eight) days.  01/13/19  Yes [provider]  hydrochlorothiazide (MICROZIDE) 12.5 MG capsule Take 12.5 mg by mouth daily.   Yes [provider]  levETIRAcetam (KEPPRA) 500 MG tablet Take 1 tablet (500 mg total) by mouth 2 (two) times daily. 10/26/19 01/24/20 Yes Sowles, Drue Stager, MD  potassium chloride (MICRO-K) 10 MEQ CR capsule Take 10 mEq by mouth daily.   Yes [provider]  traZODone (DESYREL) 50 MG tablet Take 1 tablet (50 mg total) by mouth at bedtime. 07/20/19  Yes Blake Divine, MD  rosuvastatin (CRESTOR) 40 MG tablet Take 1 tablet (40 mg total) by mouth daily. 10/25/20   Steele Sizer, MD  valsartan (DIOVAN) 160 MG tablet Take 1 tablet (160 mg total) by mouth daily. Patient not taking: No sig reported 10/25/20   Steele Sizer, MD    Allergies    Percocet [oxycodone-acetaminophen], Tramadol hcl, and Vicodin [hydrocodone-acetaminophen]  Review of Systems   Review of Systems  Constitutional: Negative.   Skin: Negative.   All other systems reviewed and are negative.   Physical Exam Updated Vital Signs BP (!) 132/98 (BP Location: Right Arm)   Pulse 62   Temp 98 F (36.7 C) (Oral)   Resp 18   Ht 5\' 4"  (1.626 m)   Wt 104.3 kg   SpO2 100%   BMI 39.48 kg/m   Physical Exam Constitutional:      General: She is not in acute distress.    Appearance: She is well-developed and well-nourished.  HENT:     Head:  Normocephalic.  Cardiovascular:     Rate and Rhythm: Normal rate.  Pulmonary:     Effort: Pulmonary effort is normal.  Abdominal:     General: Abdomen is protuberant.     Tenderness: There is no abdominal tenderness.  Musculoskeletal:        General: No edema. Normal range of motion.     Cervical back: Neck supple.  Skin:    Comments: No ttp, edema or erythema at injection site right hip.     ED Results / Procedures / Treatments   Labs (all labs ordered are listed, but only abnormal  results are displayed) Labs Reviewed - No data to display  EKG None  Radiology DG Hip Unilat W or Wo Pelvis 1 View Right  Result Date: 10/27/2020 CLINICAL DATA:  Evaluate for radiopaque soft tissue foreign body. EXAM: DG HIP (WITH OR WITHOUT PELVIS) 1V RIGHT COMPARISON:  None. FINDINGS: There is no evidence of hip fracture or dislocation. Mild degenerative changes are seen within the right hip in the form of joint space narrowing and acetabular sclerosis. Soft tissue structures are unremarkable. No radiopaque soft tissue foreign bodies are identified. IMPRESSION: No radiopaque soft tissue foreign bodies. Electronically Signed   By: Virgina Norfolk M.D.   On: 10/27/2020 19:10    Procedures Procedures   Medications Ordered in ED Medications  LORazepam (ATIVAN) tablet 1 mg (1 mg Oral Given 10/27/20 1856)    ED Course  I have reviewed the triage vital signs and the nursing notes.  Pertinent labs & imaging results that were available during my care of the patient were reviewed by me and considered in my medical decision making (see chart for details).    MDM Rules/Calculators/A&P                          Imaging reviewed and discussed with pt.  Reassurance given.  Advised to check the floor very carefully in her home where the incident occurred looking for needle to avoid potential additional injury.  Prn f/u anticipated. Final Clinical Impression(s) / ED Diagnoses Final diagnoses:  Worried  well    Rx / DC Orders ED Discharge Orders    None       [name] 10/29/20 1111    Fredia Sorrow, MD 11/15/20 [phone]

## 2020-11-06 ENCOUNTER — Ambulatory Visit: Payer: Self-pay

## 2020-11-06 DIAGNOSIS — I10 Essential (primary) hypertension: Secondary | ICD-10-CM

## 2020-11-06 DIAGNOSIS — E1169 Type 2 diabetes mellitus with other specified complication: Secondary | ICD-10-CM

## 2020-11-08 NOTE — Chronic Care Management (AMB) (Signed)
Chronic Care Management   Follow Up Note    Name: Julianna Vanwagner MRN: [id number] DOB: 1951/06/28  Primary Care Provider: Steele Sizer, MD Reason for referral : Chronic Care Management   Lauren [name] is a 70 y.o. year old female who is a primary care patient of Steele Sizer, MD.  A telephonic outreach was conducted today.    Review of Ms. Marcus's status, including review of consultants reports, relevant labs and test results was conducted today. Collaboration with appropriate care team members was performed as part of the comprehensive evaluation and provision of chronic care management services.    SDOH (Social Determinants of Health) assessments performed: No     Outpatient Encounter Medications as of 11/06/2020  Medication Sig Note  . aspirin EC 81 MG tablet Take 1 tablet (81 mg total) by mouth daily before breakfast.   . atorvastatin (LIPITOR) 40 MG tablet Take 40 mg by mouth daily.   [name] escitalopram (LEXAPRO) 10 MG tablet Take 1 tablet (10 mg total) by mouth daily.   [name] gabapentin (NEURONTIN) 100 MG capsule Take 1 capsule (100 mg total) by mouth 2 (two) times daily.   . haloperidol decanoate (HALDOL DECANOATE) 100 MG/ML injection Inject 100 mg into the muscle every 28 (twenty-eight) days.  09/18/2019: Filled 08/30/2019  . hydrochlorothiazide (MICROZIDE) 12.5 MG capsule Take 12.5 mg by mouth daily.   . potassium chloride (MICRO-K) 10 MEQ CR capsule Take 10 mEq by mouth daily.   . rosuvastatin (CRESTOR) 40 MG tablet Take 1 tablet (40 mg total) by mouth daily.   . traZODone (DESYREL) 50 MG tablet Take 1 tablet (50 mg total) by mouth at bedtime.   . valsartan (DIOVAN) 160 MG tablet Take 1 tablet (160 mg total) by mouth daily. (Patient not taking: No sig reported)    No facility-administered encounter medications on file as of 11/06/2020.      Objective:  Goals Addressed            This Visit's Progress   . Chronic Disease Management       CARE PLAN  ENTRY (see longtitudinal plan of care for additional care plan information)  Current Barriers:  . Chronic Disease Management support and education needs related to Hypertension, Diabetes, Hyperlipidemia.  Case Manager Clinical Goal(s):  [name] Over the next 90 days, patient will be hospitalized due to chronic disease related complications. . Over the next 90 days, patient will attend all medical appointments as scheduled. . Over the next 90 days, patient will take all medications as prescribed. . Over the next 90 days, patient will monitor blood pressure and record readings. . Over the next 90 days, patient will adhere to recommended cardiac prudent/diabetic diet. . Over the next 90 days, patient will follow recommended safety measures to prevent falls. . Over the next 30 days, patient will obtain glucometer and supplies if approved by MD and monitor blood glucose levels daily.   Interventions:  . Reviewed medications and treatment recommendations.  Thoroughly reviewed current medications. She reports receiving her adherence packages as scheduled from [place]. Also received pill bottles. We discussed the recent change from atorvastatin to rosuvastatin. She was able to identify the atorvastatin tablets in the packages and reports that she will remove them. She confirmed receipt of the rosuvastatin via pill bottle. She reports being evaluated by the [facility] nurse practitioner earlier today. States she did not receive her previous Haldol injection as scheduled d/t issues with the needle and medication leaking. The injection  was administered today. Encouraged to ensure discontinued medications are removed from her adherence packages. Advised to return the packages to the pharmacy if additional medications are discontinued and she is unable to identify the pills. Also encouraged to notify the [facility] nurse practitioner if she is unable to go to the pharmacy. Reports following treatment  recommendations. Overall feels that she is doing well.  . Discussed care management needs. Ms. Vicente denies urgent concerns or immediate care management needs. She feels that she is doing well in the home. Denies recent falls and continues to use a device with ambulation. No concerns regarding decline in ability to perform self-care and small tasks. She continues to require assistance. She still resides with her cousin who serves as her primary caregiver. Declines need for additional assistance in the home. Declines nutritional needs. She is pending a clinic visit in May and states that will require transportation assistance if her cousin is not available. Agreed to call next month if assistance is required.    Patient Self Care Activities:  . Unable to perform ADLs independently . Unable to perform IADLs independently   Please see past updates related to this goal by clicking on the "Past Updates" button in the selected goal           PLAN Ms. Pardee will call if additional assistance/care coordination is required. She is scheduled for a clinic visit with Dr. Ancil Boozer in May. Agreed to call if transportation assistance is needed. The care management team will gladly assist.     [facility] Latino [facility] Care Management [facility] [phone]

## 2020-11-13 NOTE — Patient Instructions (Signed)
Thank you for allowing the Chronic Care Management team to participate in your care.      Goals Addressed            This Visit's Progress   . Chronic Disease Management       CARE PLAN ENTRY (see longtitudinal plan of care for additional care plan information)  Current Barriers:  . Chronic Disease Management support and education needs related to Hypertension, Diabetes, Hyperlipidemia.  Case Manager Clinical Goal(s):  [name] Over the next 90 days, patient will be hospitalized due to chronic disease related complications. . Over the next 90 days, patient will attend all medical appointments as scheduled. . Over the next 90 days, patient will take all medications as prescribed. . Over the next 90 days, patient will monitor blood pressure and record readings. . Over the next 90 days, patient will adhere to recommended cardiac prudent/diabetic diet. . Over the next 90 days, patient will follow recommended safety measures to prevent falls. . Over the next 30 days, patient will obtain glucometer and supplies if approved by MD and monitor blood glucose levels daily.   Interventions:  . Reviewed medications and treatment recommendations.  Thoroughly reviewed current medications. She reports receiving her adherence packages as scheduled from [place]. Also received pill bottles. We discussed the recent change from atorvastatin to rosuvastatin. She was able to identify the atorvastatin tablets in the packages and reports that she will remove them. She confirmed receipt of the rosuvastatin via pill bottle. She reports being evaluated by the [facility] nurse practitioner earlier today. States she did not receive her previous Haldol injection as scheduled d/t issues with the needle and medication leaking. The injection was administered today. Encouraged to ensure discontinued medications are removed from her adherence packages. Advised to return the packages to the pharmacy if additional  medications are discontinued and she is unable to identify the pills. Also encouraged to notify the [facility] nurse practitioner if she is unable to go to the pharmacy. Reports following treatment recommendations. Overall feels that she is doing well.  . Discussed care management needs. Ms. Bedgood denies urgent concerns or immediate care management needs. She feels that she is doing well in the home. Denies recent falls and continues to use a device with ambulation. No concerns regarding decline in ability to perform self-care and small tasks. She continues to require assistance. She still resides with her cousin who serves as her primary caregiver. Declines need for additional assistance in the home. Declines nutritional needs. She is pending a clinic visit in May and states that will require transportation assistance if her cousin is not available. Agreed to call next month if assistance is required.    Patient Self Care Activities:  . Unable to perform ADLs independently . Unable to perform IADLs independently   Please see past updates related to this goal by clicking on the "Past Updates" button in the selected goal            Ms. Christenbury verbalized understanding of the information discussed during the telephonic outreach today. Declined need for mailed/printed instructions or resources. She will call if additional assistance/care coordination is required. She is scheduled for a clinic visit with Dr. Ancil Boozer in May. Agreed to call if transportation assistance is needed. The care management team will gladly assist.     [facility]ris Latino [facility] Care Management [facility] [phone]

## 2020-11-15 ENCOUNTER — Telehealth: Payer: Self-pay | Admitting: Family Medicine

## 2020-11-15 NOTE — Telephone Encounter (Signed)
Copied from [place] [id number]. Topic: Medicare AWV >> Nov 15, 2020  2:14 PM Cher Nakai R wrote: Reason for CRM:   No answer unable to leave a message for patient to call back and schedule Medicare Annual Wellness Visit (AWV) in office.   If unable to come into the office for AWV,  please offer to do virtually or by telephone.  Last AWV: 10/26/2019  Please schedule at anytime with [place].  40 minute appointment  Any questions, please contact me at [phone]

## 2020-11-30 ENCOUNTER — Ambulatory Visit (INDEPENDENT_AMBULATORY_CARE_PROVIDER_SITE_OTHER): Payer: Medicare Other

## 2020-11-30 VITALS — Ht 64.0 in | Wt 230.0 lb

## 2020-11-30 DIAGNOSIS — Z0000 Encounter for general adult medical examination without abnormal findings: Secondary | ICD-10-CM

## 2020-11-30 NOTE — Progress Notes (Signed)
Subjective:   Lauren [name] is a 70 y.o. female who presents for Medicare Annual (Subsequent) preventive examination.  Virtual Visit via Telephone Note  I connected with  Lauren [name] on 11/30/20 at  1:30 PM EDT by telephone and verified that I am speaking with the correct person using two identifiers.  Location: Patient: Home Provider: [place] Persons participating in the virtual visit: patient/Nurse Health Advisor   I discussed the limitations, risks, security and privacy concerns of performing an evaluation and management service by telephone and the availability of in person appointments. The patient expressed understanding and agreed to proceed.  Interactive audio and video telecommunications were attempted between this nurse and patient, however failed, due to patient having technical difficulties OR patient did not have access to video capability.  We continued and completed visit with audio only.  Some vital signs may be absent or patient reported.   Clemetine Marker, LPN    Review of Systems     Cardiac Risk Factors include: advanced age (>63men, >81 women);diabetes mellitus;dyslipidemia;hypertension;obesity (BMI >30kg/m2)     Objective:    Today's Vitals   11/30/20 1308  Weight: 230 lb (104.3 kg)  Height: 5\' 4"  (1.626 m)  PainSc: 5    Body mass index is 39.48 kg/m.  Advanced Directives 10/27/2020 09/16/2019 06/24/2019 05/03/2019 04/26/2019 04/07/2019 04/06/2019  Does Patient Have a Medical Advance Directive? No No No No No No No;Yes  Does patient want to make changes to medical advance directive? - - - - No - Guardian declined No - Guardian declined Yes (Inpatient - patient requests chaplain consult to change a medical advance directive)  Would patient like information on creating a medical advance directive? - No - Patient declined No - Patient declined No - Patient declined No - Patient declined - No - Patient declined  Some encounter information is  confidential and restricted. Go to Review Flowsheets activity to see all data.    Current Medications (verified) Outpatient Encounter Medications as of 11/30/2020  Medication Sig  . aspirin EC 81 MG tablet Take 1 tablet (81 mg total) by mouth daily before breakfast.  . escitalopram (LEXAPRO) 10 MG tablet Take 1 tablet (10 mg total) by mouth daily.  [name] gabapentin (NEURONTIN) 100 MG capsule Take 1 capsule (100 mg total) by mouth 2 (two) times daily.  . haloperidol decanoate (HALDOL DECANOATE) 100 MG/ML injection Inject 100 mg into the muscle every 28 (twenty-eight) days.   . potassium chloride (KLOR-CON) 10 MEQ tablet Take 10 mEq by mouth daily.  . rosuvastatin (CRESTOR) 40 MG tablet Take 1 tablet (40 mg total) by mouth daily.  . traZODone (DESYREL) 50 MG tablet Take 1 tablet (50 mg total) by mouth at bedtime.  . benztropine (COGENTIN) 1 MG tablet Take 1-2 tablets (1-2 mg total) by mouth 2 (two) times daily. One tablet (1mg ) in the morning and two tablets (2mg ) at bedtime (Patient taking differently: Take 1 mg by mouth 3 (three) times daily. One tablet (1mg ) in the morning and two tablets (2mg ) at bedtime)  . levETIRAcetam (KEPPRA) 500 MG tablet Take 1 tablet (500 mg total) by mouth 2 (two) times daily.  [name] lisinopril-hydrochlorothiazide (ZESTORETIC) 10-12.5 MG tablet Take 1 tablet by mouth daily.  . valsartan (DIOVAN) 160 MG tablet Take 1 tablet (160 mg total) by mouth daily. (Patient not taking: No sig reported)  . [DISCONTINUED] atorvastatin (LIPITOR) 40 MG tablet Take 40 mg by mouth daily. (Patient not taking: Reported on 11/13/2020)  . [DISCONTINUED] hydrochlorothiazide (  MICROZIDE) 12.5 MG capsule Take 12.5 mg by mouth daily.  . [DISCONTINUED] potassium chloride (MICRO-K) 10 MEQ CR capsule Take 10 mEq by mouth daily.   No facility-administered encounter medications on file as of 11/30/2020.    Allergies (verified) Percocet [oxycodone-acetaminophen], Tramadol hcl, and Vicodin  [hydrocodone-acetaminophen]   History: Past Medical History:  Diagnosis Date  . Anxiety   . Apnea, sleep 02/02/2014  . Awareness of heartbeats 02/02/2014  . Breathlessness on exertion 02/02/2014  . Diabetes mellitus   . Encounter for pre-employment examination 06/29/2015  . Excessive sweating 07/05/2015  . Fibromyalgia   . GERD (gastroesophageal reflux disease)   . Gravida 1 10/26/2015   1.    . Heart murmur   . Herniated disc   . Hyperlipidemia   . Hypertension   . Itch of skin 10/26/2015  . Lack of bladder control   . Lung tumor   . Rheumatoid arthritis ([place])   . Schizoaffective disorder ([place])   . Screening for cervical cancer 07/29/2017  . 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 last seizure 2013!  [name] Sex counseling 10/26/2015  . Sleep apnea   . 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 Stiffness of both knees 06/22/2015  . Stroke [facility])    Past Surgical History:  Procedure Laterality Date  . BRAIN TUMOR EXCISION  2012   benign  . CARDIAC CATHETERIZATION  2008  . CESAREAN SECTION    . CYST REMOVAL HAND    . LUNG LOBECTOMY  1977   benign tumor  . 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 Heart disease Brother   . Depression Mother   . Heart attack Mother   . Stroke Mother   . Alcohol abuse Father   . Stroke Father   . Diabetes Sister   . Diabetes Sister   . Stomach cancer Sister   . Kidney disease Sister   . COPD Brother   . Lung cancer Brother   . Diabetes Brother    Social History   Socioeconomic History  . Marital status: Single    Spouse name: Not on file  . Number of children: 1  . Years of education: Not on file  . Highest education level: 12th grade  Occupational History  . Occupation: disbaled  Tobacco Use  . Smoking status: Never Smoker   . Smokeless tobacco: Never Used  Vaping Use  . Vaping Use: Never used  Substance and Sexual Activity  . Alcohol use: No    Alcohol/week: 0.0 standard drinks  . Drug use: No  . Sexual activity: Not Currently  Other Topics Concern  . Not on file  Social History Narrative   Pt currently living with her cousin in [place] who is also her Payee   Social Determinants of Health   Financial Resource Strain: Low Risk   . Difficulty of Paying Living Expenses: Not hard at all  Food Insecurity: No Food Insecurity  . Worried About Charity fundraiser in the Last Year: Never true  . Ran Out of Food in the Last Year: Never true  Transportation Needs: No Transportation Needs  . Lack of Transportation (Medical): No  . Lack of Transportation (Non-Medical): No  Physical Activity: Inactive  . Days of Exercise per Week: 0 days  .  Minutes of Exercise per Session: 0 min  Stress: Stress Concern Present  . Feeling of Stress : To some extent  Social Connections: Moderately Isolated  . Frequency of Communication with Friends and Family: More than three times a week  . Frequency of Social Gatherings with Friends and Family: Once a week  . Attends Religious Services: 1 to 4 times per year  . Active Member of Clubs or Organizations: No  . Attends Archivist Meetings: Never  . Marital Status: Never married    Tobacco Counseling Counseling given: Not Answered   Clinical Intake:  Pre-visit preparation completed: Yes  Pain : 0-10 Pain Score: 5  Pain Type: Chronic pain Pain Location: Knee (joints) Pain Descriptors / Indicators: Aching Pain Onset: More than a month ago Pain Frequency: Several days a week     BMI - recorded: 39.48 Nutritional Status: BMI > 30  Obese Nutritional Risks: None  How often do you need to have someone help you when you read instructions, pamphlets, or other written materials from your doctor or pharmacy?: 1 - Never  Diabetic? No  Interpreter Needed?:  No  Information entered by :: Amy Hopkins, LPN   Activities of Daily Living In your present state of health, do you have any difficulty performing the following activities: 11/30/2020 10/25/2020  Hearing? N Y  Vision? N N  Difficulty concentrating or making decisions? Tempie Donning  Walking or climbing stairs? Y Y  Dressing or bathing? N N  Doing errands, shopping? Y Y  Comment she has assistance Facilities manager and eating ? N -  Using the Toilet? N -  In the past six months, have you accidently leaked urine? Y -  Comment wears depends -  Do you have problems with loss of bowel control? N -  Managing your Medications? Y -  Comment she gets pill pack -  Managing your Finances? Y -  Comment cousin helps her -  Housekeeping or managing your Housekeeping? N -  Some recent data might be hidden    Patient Care Team: Steele Sizer, MD as PCP - General (Family Medicine) Deetta Perla, MD as Consulting Physician (Neurosurgery) Anabel Bene, MD as Referring Physician (Neurology) Noreene Filbert, MD as Referring Physician (Radiation Oncology) Ottie Glazier, MD as Consulting Physician (Pulmonary Disease) Marlowe Sax, MD as Referring Physician (Internal Medicine) Madelin Headings, DO (Optometry)  Indicate any recent Medical Services you may have received from other than [facility] providers in the past year (date may be approximate).     Assessment:   This is a routine wellness examination for Lauren [name].  Hearing/Vision screen  Hearing Screening   125Hz  250Hz  500Hz  1000Hz  2000Hz  3000Hz  4000Hz  6000Hz  8000Hz   Right ear:           Left ear:           Comments: Pt has mild hearing difficulty  Vision Screening Comments: Pt goes to [place] for vision screenings  Dietary issues and exercise activities discussed: Current Exercise Habits: The patient does not participate in regular exercise at present, Exercise limited by: orthopedic condition(s)  Goals    .  "I need to find  a new place to live"      Current Barriers:  . Housing barriers  Clinical Social Work Clinical Goal(s):  [name] Over the next 90 days, patient will work with her case Freight forwarder through [employer] and the ACT program  to address needs related to housing   Interventions: . Patient interviewed and  appropriate assessments performed . Confirmed that patient is residing temporarily at the [facility] however would like to move into affordable housing . Confirmed that patient is paid up through 09/12/19 and is currently working with her case manager with [name] to find affordable housing . Confirmed that patient is also working with the ACT team to identify housing with plan to reside with sister until affordable housing is located . Advised patient to follow up with [name] and the ACT Team regarding housing needs.  Patient Self Care Activities:  . Patient verbalizes understanding of plan to work with her [place] worker and ACT team to  identify affordable housing  . Unable to independently afford housing  . Unable to perform IADLs independently  Initial goal documentation\     .  Chronic Disease Management      CARE PLAN ENTRY (see longtitudinal plan of care for additional care plan information)  Current Barriers:  . Chronic Disease Management support and education needs related to Hypertension, Diabetes, Hyperlipidemia.  Case Manager Clinical Goal(s):  [name] Over the next 90 days, patient will be hospitalized due to chronic disease related complications. . Over the next 90 days, patient will attend all medical appointments as scheduled. . Over the next 90 days, patient will take all medications as prescribed. . Over the next 90 days, patient will monitor blood pressure and record readings. . Over the next 90 days, patient will adhere to recommended cardiac prudent/diabetic diet. . Over the next 90 days, patient will follow recommended safety measures to prevent falls. . Over  the next 30 days, patient will obtain glucometer and supplies if approved by MD and monitor blood glucose levels daily.   Interventions:  . Reviewed medications and treatment recommendations.  Thoroughly reviewed current medications. She reports receiving her adherence packages as scheduled from [place]. Also received pill bottles. We discussed the recent change from atorvastatin to rosuvastatin. She was able to identify the atorvastatin tablets in the packages and reports that she will remove them. She confirmed receipt of the rosuvastatin via pill bottle. She reports being evaluated by the [facility] nurse practitioner earlier today. States she did not receive her previous Haldol injection as scheduled d/t issues with the needle and medication leaking. The injection was administered today. Encouraged to ensure discontinued medications are removed from her adherence packages. Advised to return the packages to the pharmacy if additional medications are discontinued and she is unable to identify the pills. Also encouraged to notify the [facility] nurse practitioner if she is unable to go to the pharmacy. Reports following treatment recommendations. Overall feels that she is doing well.  . Discussed care management needs. Lauren [name] denies urgent concerns or immediate care management needs. She feels that she is doing well in the home. Denies recent falls and continues to use a device with ambulation. No concerns regarding decline in ability to perform self-care and small tasks. She continues to require assistance. She still resides with her cousin who serves as her primary caregiver. Declines need for additional assistance in the home. Declines nutritional needs. She is pending a clinic visit in May and states that will require transportation assistance if her cousin is not available. Agreed to call next month if assistance is required.    Patient Self Care Activities:  . Unable to  perform ADLs independently . Unable to perform IADLs independently   Please see past updates related to this goal by clicking on the "Past Updates" button  in the selected goal      .  My medicine is expensive (pt-stated)      Clinical Goal(s): Over the next 10 days, Lauren [name] will provide the necessary supplementary documents (proof of out of pocket prescription expenditure, proof of household income) needed for medication assistance applications to [facility] pharmacist.   Interventions:  09/22/17: Patient states that [name] is covering her injection costs and she doesn't need to apply for assistance. She does have to pay for her maintenance medications at [facility] and owes about $140, but she likes having her medications bubble packaged (instead of using mail order).  10/15/2018  Notified CCM RN CM of conversation with patient regarding medication costs.   Discussed with patient reason for transferring from mail order to pill pack pharmacy  Reactivated previously completed goal for continued assistance  See Past Updates for further interventions     .  Weight (lb) < 200 lb (90.7 kg)      Recommend weight loss of 60+ pounds. Discussed what a healthy diet consists of.       Depression Screen PHQ 2/9 Scores 11/30/2020 10/25/2020 08/08/2020 12/06/2019 11/24/2019 10/26/2019 10/08/2019  PHQ - 2 Score 0 0 0 0 0 0 -  PHQ- 9 Score - - - 0 - 7 -  Exception Documentation - - - - - - Medical reason    Fall Risk Fall Risk  11/30/2020 10/25/2020 08/08/2020 04/24/2020 11/24/2019  Falls in the past year? 0 0 1 - 0  Number falls in past yr: 0 0 1 - -  Injury with Fall? 0 0 0 - -  Risk for fall due to : Impaired balance/gait - History of fall(s);Mental status change;Impaired balance/gait;Impaired mobility Impaired balance/gait;History of fall(s) Impaired balance/gait;Medication side effect  Follow up Falls prevention discussed - Falls evaluation completed - Falls prevention discussed     FALL RISK PREVENTION PERTAINING TO THE HOME:  Any stairs in or around the home? No  If so, are there any without handrails? No  Home free of loose throw rugs in walkways, pet beds, electrical cords, etc? Yes  Adequate lighting in your home to reduce risk of falls? Yes   ASSISTIVE DEVICES UTILIZED TO PREVENT FALLS:  Life alert? No  Use of a cane, walker or w/c? Yes  Grab bars in the bathroom? Yes  Shower chair or bench in shower? No  Elevated toilet seat or a handicapped toilet? Yes   TIMED UP AND GO:  Was the test performed? No .  Telephonic visit  Cognitive Function:     6CIT Screen 11/30/2020 10/22/2018 05/26/2017  What Year? 0 points 0 points 0 points  What month? 0 points 0 points 0 points  What time? 0 points 0 points 0 points  Count back from 20 0 points 2 points 0 points  Months in reverse 2 points 2 points 0 points  Repeat phrase 6 points 2 points 2 points  Total Score 8 6 2     Immunizations Immunization History  Administered Date(s) Administered  . Fluad Quad(high Dose 65+) 06/09/2019  . Influenza, High Dose Seasonal PF 05/07/2018  . Influenza,inj,Quad PF,6+ Mos 06/22/2015, 05/30/2016, 05/26/2017  . Influenza-Unspecified 07/19/2020  . Moderna Sars-Covid-2 Vaccination 11/19/2019, 12/22/2019, 07/19/2020  . PPD Test 04/08/2019  . Pneumococcal Conjugate-13 04/01/2018  . Pneumococcal Polysaccharide-23 06/09/2019  . Tdap 08/20/2010    TDAP status: Due, Education has been provided regarding the importance of this vaccine. Advised may receive this vaccine at local pharmacy or Health Dept.  Aware to provide a copy of the vaccination record if obtained from local pharmacy or Health Dept. Verbalized acceptance and understanding.  Flu Vaccine status: Up to date  Pneumococcal vaccine status: Up to date  Covid-19 vaccine status: Completed vaccines  Qualifies for Shingles Vaccine? Yes   Zostavax completed No   Shingrix Completed?: No.    Education has been  provided regarding the importance of this vaccine. Patient has been advised to call insurance company to determine out of pocket expense if they have not yet received this vaccine. Advised may also receive vaccine at local pharmacy or Health Dept. Verbalized acceptance and understanding.  Screening Tests Health Maintenance  Topic Date Due  . TETANUS/TDAP  08/20/2020  . COVID-19 Vaccine (4 - Booster for Moderna series) 01/16/2021  . OPHTHALMOLOGY EXAM  04/10/2021  . FOOT EXAM  04/24/2021  . HEMOGLOBIN A1C  04/24/2021  . MAMMOGRAM  05/11/2021  . COLONOSCOPY (Pts 45-40yrs Insurance coverage will need to be confirmed)  12/10/2023  . INFLUENZA VACCINE  Completed  . DEXA SCAN  Completed  . Hepatitis C Screening  Completed  . PNA vac Low Risk Adult  Completed  . HPV VACCINES  Aged Out    Health Maintenance  Health Maintenance Due  Topic Date Due  . TETANUS/TDAP  08/20/2020    Colorectal cancer screening: Type of screening: Colonoscopy. Completed 12/09/2013. Repeat every 10 years   Mammogram status: Completed 05/11/2020. Repeat every year  Bone Density status: Completed 05/11/2020. Results reflect: Bone density results: NORMAL. Repeat every 2-5 years.  Lung Cancer Screening: (Low Dose CT Chest recommended if Age 67-80 years, 30 pack-year currently smoking OR have quit w/in 15years.) does not qualify.   Additional Screening:  Hepatitis C Screening: does qualify; Completed 05/26/2017  Vision Screening: Recommended annual ophthalmology exams for early detection of glaucoma and other disorders of the eye. Is the patient up to date with their annual eye exam?  yes Who is the provider or what is the name of the office in which the patient attends annual eye exams? [facility] [place], [place] If pt is not established with a provider, would they like to be referred to a provider to establish care? No .   Dental Screening: Recommended annual dental exams for proper oral  hygiene  Community Resource Referral / Chronic Care Management: CRR required this visit?  No   CCM required this visit?  No      Plan:     I have personally reviewed and noted the following in the patient's chart:   . Medical and social history . Use of alcohol, tobacco or illicit drugs  . Current medications and supplements . Functional ability and status . Nutritional status . Physical activity . Advanced directives . List of other physicians . Hospitalizations, surgeries, and ER visits in previous 12 months . Vitals . Screenings to include cognitive, depression, and falls . Referrals and appointments  In addition, I have reviewed and discussed with patient certain preventive protocols, quality metrics, and best practice recommendations. A written personalized care plan for preventive services as well as general preventive health recommendations were provided to patient.     Clemetine Marker, LPN   [id number]   Nurse Notes: Trigger finger in left hand and at times and hand goes numb at times.

## 2020-11-30 NOTE — Patient Instructions (Signed)
[name] , Thank you for taking time to come for your Medicare Wellness Visit. I appreciate your ongoing commitment to your health goals. Please review the following plan we discussed and let me know if I can assist you in the future.   Screening recommendations/referrals: Colonoscopy: Done 12/09/2013 Repeat in 2025 Mammogram: Done 05/11/20 - Repeat 1 year Bone Density: Done 05/11/20 - Repeat 1 year  Recommended yearly ophthalmology/optometry visit for glaucoma screening and checkup Recommended yearly dental visit for hygiene and checkup  Vaccinations: Influenza vaccine: Done 07/19/2020 Pneumococcal vaccine: Done 06/09/2019 Tdap vaccine: Done 08/20/2010 - Due for repeat Shingles vaccine: Shingrix discussed. Please contact your pharmacy for coverage information.    Covid-19 vaccine: Complete: 11/19/19, 12/22/19, & 07/19/20  Conditions/risks identified: Work on getting a little more physical activity/stretching, fall prevention.  Next appointment: Please follow up in one year for your Medicare Annual Wellness visit.     Preventive Care 21 Years and Older, Female Preventive care refers to lifestyle choices and visits with your health care provider that can promote health and wellness. What does preventive care include?  A yearly physical exam. This is also called an annual well check.  Dental exams once or twice a year.  Routine eye exams. Ask your health care provider how often you should have your eyes checked.  Personal lifestyle choices, including:  Daily care of your teeth and gums.  Regular physical activity.  Eating a healthy diet.  Avoiding tobacco and drug use.  Limiting alcohol use.  Practicing safe sex.  Taking low-dose aspirin every day.  Taking vitamin and mineral supplements as recommended by your health care provider. What happens during an annual well check? The services and screenings done by your health care provider during your annual well check will  depend on your age, overall health, lifestyle risk factors, and family history of disease. Counseling  Your health care provider may ask you questions about your:  Alcohol use.  Tobacco use.  Drug use.  Emotional well-being.  Home and relationship well-being.  Sexual activity.  Eating habits.  History of falls.  Memory and ability to understand (cognition).  Work and work Statistician.  Reproductive health. Screening  You may have the following tests or measurements:  Height, weight, and BMI.  Blood pressure.  Lipid and cholesterol levels. These may be checked every 5 years, or more frequently if you are over 107 years old.  Skin check.  Lung cancer screening. You may have this screening every year starting at age 41 if you have a 30-pack-year history of smoking and currently smoke or have quit within the past 15 years.  Fecal occult blood test (FOBT) of the stool. You may have this test every year starting at age 39.  Flexible sigmoidoscopy or colonoscopy. You may have a sigmoidoscopy every 5 years or a colonoscopy every 10 years starting at age 61.  Hepatitis C blood test.  Hepatitis B blood test.  Sexually transmitted disease (STD) testing.  Diabetes screening. This is done by checking your blood sugar (glucose) after you have not eaten for a while (fasting). You may have this done every 1-3 years.  Bone density scan. This is done to screen for osteoporosis. You may have this done starting at age 47.  Mammogram. This may be done every 1-2 years. Talk to your health care provider about how often you should have regular mammograms. Talk with your health care provider about your test results, treatment options, and if necessary, the need for more  tests. Vaccines  Your health care provider may recommend certain vaccines, such as:  Influenza vaccine. This is recommended every year.  Tetanus, diphtheria, and acellular pertussis (Tdap, Td) vaccine. You may need a  Td booster every 10 years.  Shingles vaccine. You may need this after age 60.  Pneumococcal 13-valent conjugate (PCV13) vaccine. One dose is recommended after age 84.  Pneumococcal polysaccharide (PPSV23) vaccine. One dose is recommended after age 43. Talk to your health care provider about which screenings and vaccines you need and how often you need them. This information is not intended to replace advice given to you by your health care provider. Make sure you discuss any questions you have with your health care provider. Document Released: 09/22/2015 Document Revised: 05/15/2016 Document Reviewed: 06/27/2015 Elsevier Interactive Patient Education  2017 [place] Prevention in the Home Falls can cause injuries. They can happen to people of all ages. There are many things you can do to make your home safe and to help prevent falls. What can I do on the outside of my home?  Regularly fix the edges of walkways and driveways and fix any cracks.  Remove anything that might make you trip as you walk through a door, such as a raised step or threshold.  Trim any bushes or trees on the path to your home.  Use bright outdoor lighting.  Clear any walking paths of anything that might make someone trip, such as rocks or tools.  Regularly check to see if handrails are loose or broken. Make sure that both sides of any steps have handrails.  Any raised decks and porches should have guardrails on the edges.  Have any leaves, snow, or ice cleared regularly.  Use sand or salt on walking paths during winter.  Clean up any spills in your garage right away. This includes oil or grease spills. What can I do in the bathroom?  Use night lights.  Install grab bars by the toilet and in the tub and shower. Do not use towel bars as grab bars.  Use non-skid mats or decals in the tub or shower.  If you need to sit down in the shower, use a plastic, non-slip stool.  Keep the floor dry.  Clean up any water that spills on the floor as soon as it happens.  Remove soap buildup in the tub or shower regularly.  Attach bath mats securely with double-sided non-slip rug tape.  Do not have throw rugs and other things on the floor that can make you trip. What can I do in the bedroom?  Use night lights.  Make sure that you have a light by your bed that is easy to reach.  Do not use any sheets or blankets that are too big for your bed. They should not hang down onto the floor.  Have a firm chair that has side arms. You can use this for support while you get dressed.  Do not have throw rugs and other things on the floor that can make you trip. What can I do in the kitchen?  Clean up any spills right away.  Avoid walking on wet floors.  Keep items that you use a lot in easy-to-reach places.  If you need to reach something above you, use a strong step stool that has a grab bar.  Keep electrical cords out of the way.  Do not use floor polish or wax that makes floors slippery. If you must use wax, use non-skid floor  wax.  Do not have throw rugs and other things on the floor that can make you trip. What can I do with my stairs?  Do not leave any items on the stairs.  Make sure that there are handrails on both sides of the stairs and use them. Fix handrails that are broken or loose. Make sure that handrails are as long as the stairways.  Check any carpeting to make sure that it is firmly attached to the stairs. Fix any carpet that is loose or worn.  Avoid having throw rugs at the top or bottom of the stairs. If you do have throw rugs, attach them to the floor with carpet tape.  Make sure that you have a light switch at the top of the stairs and the bottom of the stairs. If you do not have them, ask someone to add them for you. What else can I do to help prevent falls?  Wear shoes that:  Do not have high heels.  Have rubber bottoms.  Are comfortable and fit you  well.  Are closed at the toe. Do not wear sandals.  If you use a stepladder:  Make sure that it is fully opened. Do not climb a closed stepladder.  Make sure that both sides of the stepladder are locked into place.  Ask someone to hold it for you, if possible.  Clearly mark and make sure that you can see:  Any grab bars or handrails.  First and last steps.  Where the edge of each step is.  Use tools that help you move around (mobility aids) if they are needed. These include:  Canes.  Walkers.  Scooters.  Crutches.  Turn on the lights when you go into a dark area. Replace any light bulbs as soon as they burn out.  Set up your furniture so you have a clear path. Avoid moving your furniture around.  If any of your floors are uneven, fix them.  If there are any pets around you, be aware of where they are.  Review your medicines with your doctor. Some medicines can make you feel dizzy. This can increase your chance of falling. Ask your doctor what other things that you can do to help prevent falls. This information is not intended to replace advice given to you by your health care provider. Make sure you discuss any questions you have with your health care provider. Document Released: 06/22/2009 Document Revised: 02/01/2016 Document Reviewed: 09/30/2014 Elsevier Interactive Patient Education  2017 [employer].

## 2020-12-06 DIAGNOSIS — R519 Headache, unspecified: Secondary | ICD-10-CM | POA: Diagnosis not present

## 2020-12-18 ENCOUNTER — Other Ambulatory Visit: Payer: Self-pay | Admitting: Family Medicine

## 2021-01-11 ENCOUNTER — Other Ambulatory Visit: Payer: Self-pay | Admitting: Emergency Medicine

## 2021-01-11 DIAGNOSIS — G4733 Obstructive sleep apnea (adult) (pediatric): Secondary | ICD-10-CM

## 2021-01-15 ENCOUNTER — Ambulatory Visit: Payer: Medicare Other | Admitting: Orthopedic Surgery

## 2021-01-15 ENCOUNTER — Other Ambulatory Visit: Payer: Self-pay

## 2021-01-15 ENCOUNTER — Encounter: Payer: Self-pay | Admitting: Orthopedic Surgery

## 2021-01-15 ENCOUNTER — Ambulatory Visit: Payer: Medicare Other

## 2021-01-15 VITALS — BP 137/82 | HR 70 | Ht 64.0 in | Wt 226.0 lb

## 2021-01-15 DIAGNOSIS — M1711 Unilateral primary osteoarthritis, right knee: Secondary | ICD-10-CM | POA: Diagnosis not present

## 2021-01-15 DIAGNOSIS — G8929 Other chronic pain: Secondary | ICD-10-CM

## 2021-01-15 MED ORDER — MELOXICAM 7.5 MG PO TABS: 7.5000 mg | ORAL_TABLET | Freq: Every day | ORAL | 5 refills | Status: DC

## 2021-01-15 NOTE — Progress Notes (Signed)
NEW PROBLEM//OFFICE VISIT  Summary assessment and plan 70 year old female with history of sleep apnea diabetes hypertension mental health disorder and previous stroke.  She had a total knee at [facility] and she did okay with that about 2 years ago.  She requested a total knee replacement but she is not having severe pain and her pain is intermittent.  Although her leg gives out at times it should be handled easily with a cane and she can take some osteoarthritis medication for the pain that she is having.  I do not think she would be a good candidate for surgery at [facility] and she would need to go back to [facility] or another tertiary care center for the surgery  Chief Complaint  Patient presents with  . Knee Pain    Right, no injury, gives way and crunches, LEFT TKA 2018    70 yo F DM, Sleep Apnea ,HTN, seizure, stroke, mental ahealth disorder, stroke, s/p left tka    Review of Systems  Constitutional: Negative for fever.  Musculoskeletal: Positive for falls and joint pain.  All other systems reviewed and are negative.    Past Medical History:  Diagnosis Date  . Anxiety   . Apnea, sleep 02/02/2014  . Awareness of heartbeats 02/02/2014  . Breathlessness on exertion 02/02/2014  . Diabetes mellitus   . Encounter for pre-employment examination 06/29/2015  . Excessive sweating 07/05/2015  . Fibromyalgia   . GERD (gastroesophageal reflux disease)   . Gravida 1 10/26/2015   1.    . Heart murmur   . Herniated disc   . Hyperlipidemia   . Hypertension   . Itch of skin 10/26/2015  . Lack of bladder control   . Lung tumor   . Rheumatoid arthritis ([place])   . Schizoaffective disorder ([place])   . Screening for cervical cancer 07/29/2017  . Seizure [facility])    after brain surgery 2012. last seizure 2013!  [name] Sex counseling 10/26/2015  . Sleep apnea   . 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 Stiffness of both knees 06/22/2015  . Stroke [facility])     Past  Surgical History:  Procedure Laterality Date  . BRAIN TUMOR EXCISION  2012   benign  . CARDIAC CATHETERIZATION  2008  . CESAREAN SECTION    . CYST REMOVAL HAND    . LUNG LOBECTOMY  1977   benign tumor  . TOTAL KNEE ARTHROPLASTY Left 01/28/2017   Procedure: TOTAL KNEE ARTHROPLASTY;  Surgeon: Corky Mull, MD;  Location: [facility] ORS;  Service: Orthopedics;  Laterality: Left;    Family History  Problem Relation Age of Onset  . Heart disease Brother   . Depression Mother   . Heart attack Mother   . Stroke Mother   . Alcohol abuse Father   . Stroke Father   . Diabetes Sister   . Diabetes Sister   . Stomach cancer Sister   . Kidney disease Sister   . COPD Brother   . Lung cancer Brother   . Diabetes Brother    Social History   Tobacco Use  . Smoking status: Never Smoker  . Smokeless tobacco: Never Used  Vaping Use  . Vaping Use: Never used  Substance Use Topics  . Alcohol use: No    Alcohol/week: 0.0 standard drinks  . Drug use: No    Allergies  Allergen Reactions  . Percocet [Oxycodone-Acetaminophen] Diarrhea, Nausea And Vomiting and Nausea Only  . Tramadol Hcl Diarrhea, Nausea And  Vomiting and Nausea Only  . Vicodin [Hydrocodone-Acetaminophen] Diarrhea, Nausea And Vomiting and Nausea Only    Current Meds  Medication Sig  . aspirin EC 81 MG tablet Take 1 tablet (81 mg total) by mouth daily before breakfast.  . escitalopram (LEXAPRO) 10 MG tablet Take 1 tablet (10 mg total) by mouth daily.  [name] gabapentin (NEURONTIN) 100 MG capsule Take 1 capsule (100 mg total) by mouth 2 (two) times daily.  . haloperidol decanoate (HALDOL DECANOATE) 100 MG/ML injection Inject 100 mg into the muscle every 28 (twenty-eight) days.   [name] lisinopril-hydrochlorothiazide (ZESTORETIC) 10-12.5 MG tablet Take 1 tablet by mouth daily.  . meloxicam (MOBIC) 7.5 MG tablet Take 1 tablet (7.5 mg total) by mouth daily.  . potassium chloride (KLOR-CON) 10 MEQ tablet TAKE ONE TABLET BY MOUTH ONCE DAILY.  .  rosuvastatin (CRESTOR) 40 MG tablet Take 1 tablet (40 mg total) by mouth daily.  . traZODone (DESYREL) 50 MG tablet Take 1 tablet (50 mg total) by mouth at bedtime.  . valsartan (DIOVAN) 160 MG tablet Take 1 tablet (160 mg total) by mouth daily.    BP 137/82   Pulse 70   Ht 5\' 4"  (1.626 m)   Wt 226 lb (102.5 kg)   BMI 38.79 kg/m   Physical Exam  General appearance: Well-developed well-nourished no gross deformities  Cardiovascular normal pulse and perfusion normal color without edema  Neurologically o sensation loss or deficits or pathologic reflexes  Psychological: Awake alert and oriented x3 mood and affect normal  Skin no lacerations or ulcerations no nodularity no palpable masses, no erythema or nodularity  Musculoskeletal:   The right knee is in good alignment clinically maybe a little varus some tenderness medially no effusion  Full extension is obtained 110 degrees of flexion     MEDICAL DECISION MAKING  A.  Encounter Diagnoses  Name Primary?  . Chronic pain of right knee   . Primary osteoarthritis of right knee Yes    B. DATA ANALYSED:   IMAGING: Interpretation of images: X-rays in the office show osteoarthritis of the knee but not severe  Orders: No orders  Outside records reviewed: None   C. MANAGEMENT   Recommended oral medication no surgery at present candidate for injection if medication does not work  Meds ordered this encounter  Medications  . meloxicam (MOBIC) 7.5 MG tablet    Sig: Take 1 tablet (7.5 mg total) by mouth daily.    Dispense:  30 tablet    Refill:  5      Arther Abbott, MD  01/15/2021 2:17 PM

## 2021-01-15 NOTE — Patient Instructions (Addendum)
Start  Meds ordered this encounter  Medications  . meloxicam (MOBIC) 7.5 MG tablet    Sig: Take 1 tablet (7.5 mg total) by mouth daily.    Dispense:  30 tablet    Refill:  5   Use a cane when walking

## 2021-01-16 ENCOUNTER — Other Ambulatory Visit: Payer: Self-pay | Admitting: Family Medicine

## 2021-01-22 ENCOUNTER — Encounter: Payer: Self-pay | Admitting: Family Medicine

## 2021-01-22 ENCOUNTER — Other Ambulatory Visit: Payer: Self-pay

## 2021-01-22 ENCOUNTER — Ambulatory Visit (INDEPENDENT_AMBULATORY_CARE_PROVIDER_SITE_OTHER): Payer: Medicare Other | Admitting: Family Medicine

## 2021-01-22 VITALS — BP 138/80 | HR 85 | Temp 97.9°F | Resp 18 | Ht 64.0 in | Wt 224.0 lb

## 2021-01-22 DIAGNOSIS — M1711 Unilateral primary osteoarthritis, right knee: Secondary | ICD-10-CM

## 2021-01-22 DIAGNOSIS — E1169 Type 2 diabetes mellitus with other specified complication: Secondary | ICD-10-CM | POA: Diagnosis not present

## 2021-01-22 DIAGNOSIS — E785 Hyperlipidemia, unspecified: Secondary | ICD-10-CM

## 2021-01-22 DIAGNOSIS — G4733 Obstructive sleep apnea (adult) (pediatric): Secondary | ICD-10-CM | POA: Diagnosis not present

## 2021-01-22 DIAGNOSIS — I10 Essential (primary) hypertension: Secondary | ICD-10-CM | POA: Diagnosis not present

## 2021-01-22 DIAGNOSIS — R471 Dysarthria and anarthria: Secondary | ICD-10-CM

## 2021-01-22 DIAGNOSIS — E1159 Type 2 diabetes mellitus with other circulatory complications: Secondary | ICD-10-CM

## 2021-01-22 DIAGNOSIS — R0689 Other abnormalities of breathing: Secondary | ICD-10-CM

## 2021-01-22 DIAGNOSIS — I152 Hypertension secondary to endocrine disorders: Secondary | ICD-10-CM

## 2021-01-22 DIAGNOSIS — R569 Unspecified convulsions: Secondary | ICD-10-CM | POA: Diagnosis not present

## 2021-01-22 DIAGNOSIS — E669 Obesity, unspecified: Secondary | ICD-10-CM

## 2021-01-22 DIAGNOSIS — F200 Paranoid schizophrenia: Secondary | ICD-10-CM

## 2021-01-22 DIAGNOSIS — D329 Benign neoplasm of meninges, unspecified: Secondary | ICD-10-CM

## 2021-01-22 DIAGNOSIS — I700 Atherosclerosis of aorta: Secondary | ICD-10-CM

## 2021-01-22 DIAGNOSIS — D472 Monoclonal gammopathy: Secondary | ICD-10-CM | POA: Diagnosis not present

## 2021-01-22 MED ORDER — VALSARTAN 160 MG PO TABS: 160.0000 mg | ORAL_TABLET | Freq: Every day | ORAL | 0 refills | Status: DC

## 2021-01-22 NOTE — Progress Notes (Signed)
Name: Lauren [name]   MRN: [id number]    DOB: December 08, 1950   Date:01/22/2021       Progress Note  Subjective  Chief Complaint  Follow Up  HPI  SOB/Tachypenia: seen by Dr. Lanney Gins, he felt like symptoms likely from extra pyramidal effects. Her breathing has been stable when compared to previous visit, has intermittent tachypnea. 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 Discussed considering follow up in [place]   Schizoaffective disorder: she has been doing well on Haldolevery two weeks, she states nurse [name]) goes to her house to give her the injections. She is now living in  [place] with her cousin - [name] Her cousin [name] manages her money and paying her bills from now on.  She denies visual or auditory hallucinations Unchanged. She has medications pre-packaged and is compliant with oral medications also   Recurrence of Meningioma: resection done in 2012 with developed of left side numbness, she went to [facility] and CT showed recurrence 01/2018 . She saw Dr. Lacinda Axon but she did not want to have repeat surgery therefore seen by Dr. Baruch Gouty and s/ p radiation.Shedenies headaches, nausea, vomiting, problems with her vision but states she states tremors are intermittent now. History of seizures, taking Keppra and no symptoms in a long time. Explained importance of follow up with Dr. Lacinda Axon   Aorta atherosclerosis: found on CT chest done Nov 2018. She is on statin and aspirin daily, but LDL was not at goal, we changed from Atorvastatin to Crestor but she is currently getting both medications, we will contact pharmacy now and explain the change and also get labs done to make sure liver enzymes are within normal limits   DMII:  taking metforminand last A1C today is down to 5.7 %  Eye exam is up to date. She has polyphagia, polydipsia  and polyuria.On statin therapy but last LDL was not at goal so we switched to Crestor but pharmacy is still dispensing atorvastatin 40 mg   KWI:OXBDZ now on lisinopriland HCTZ 12.5 mg, denies chest pain or palpitation. BP is at goal today , however she states the diuretic causes her incontinence, worse in the morning in am's unable to make to the bathroom on time, we changed from lisinopril hctz to valsartan on her last visit, but pharmacy is still dispensing diuretic   Morbid obesity: she has BMI above 35 with co-morbidities, such as HTN and DM.  Sje ;pst 2 lbs since last visit   Right Knee pain: she states over the past few months she noticed intermittent anterior right knee pain, it is sharp or aching , it is  intermittent, she has been using a cane. She has some grinding on her right knee, she also has intermittent effusion but no redness.. She states the pain started after a fall at home, currently the pain is 5/10 but can go up to 8/10. She had left knee replaced in the past. She was already seen by sports medicine , Dr. Aline Brochure on 01/15/2021 , she was given meloxicam and advised to get steroid injection if no improvement She would like to get second opinion in [facility]   Patient Active Problem List   Diagnosis Date Noted  . 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 SS-A antibody positive 04/15/2019  . Seizure-like activity ([place]) 12/29/2018  . Seizures ([place]) 11/24/2018  .  Paresthesias 11/22/2018  . Mouth dryness 10/12/2018  . Hx of resection of meningioma 07/21/2018  . Tremor 07/21/2018  . Morbid obesity ([place]) 12/09/2017  . Meningioma ([place]) 11/25/2017  . Atherosclerosis of aorta ([place]) 11/25/2017  . Calcification of coronary artery 11/25/2017  . Schizophrenia ([place]) 11/04/2017  . Acid reflux 06/29/2015  . Diabetes mellitus type 2, controlled, without complications ([place]) [id number]  . Cardiac murmur 06/29/2015  . Arthritis of knee, degenerative 06/28/2015  . Insomnia w/ sleep apnea  03/21/2015  . Anxiety disorder 03/21/2015  . Hypertension 03/21/2015  . HLD (hyperlipidemia) 03/21/2015  . Urge incontinence 03/21/2015    Past Surgical History:  Procedure Laterality Date  . BRAIN TUMOR EXCISION  2012   benign  . CARDIAC CATHETERIZATION  2008  . CESAREAN SECTION    . CYST REMOVAL HAND    . LUNG LOBECTOMY  1977   benign tumor  . TOTAL KNEE ARTHROPLASTY Left 01/28/2017   Procedure: TOTAL KNEE ARTHROPLASTY;  Surgeon: Corky Mull, MD;  Location: [facility] ORS;  Service: Orthopedics;  Laterality: Left;    Family History  Problem Relation Age of Onset  . Heart disease Brother   . Depression Mother   . Heart attack Mother   . Stroke Mother   . Alcohol abuse Father   . Stroke Father   . Diabetes Sister   . Diabetes Sister   . Stomach cancer Sister   . Kidney disease Sister   . COPD Brother   . Lung cancer Brother   . Diabetes Brother     Social History   Tobacco Use  . Smoking status: Never Smoker  . Smokeless tobacco: Never Used  Substance Use Topics  . Alcohol use: No    Alcohol/week: 0.0 standard drinks     Current Outpatient Medications:  .  aspirin EC 81 MG tablet, Take 1 tablet (81 mg total) by mouth daily before breakfast., Disp: 30 tablet, Rfl: 5 .  atorvastatin (LIPITOR) 40 MG tablet, SMARTSIG:1 Tablet(s) By Mouth Every Evening, Disp: , Rfl:  .  escitalopram (LEXAPRO) 10 MG tablet, Take 1 tablet (10 mg total) by mouth daily., Disp: 90 tablet, Rfl: 1 .  gabapentin (NEURONTIN) 100 MG capsule, Take 1 capsule (100 mg total) by mouth 2 (two) times daily., Disp: 180 capsule, Rfl: 1 .  haloperidol decanoate (HALDOL DECANOATE) 100 MG/ML injection, Inject 100 mg into the muscle every 28 (twenty-eight) days. , Disp: , Rfl:  .  lisinopril-hydrochlorothiazide (ZESTORETIC) 10-12.5 MG tablet, Take 1 tablet by mouth daily., Disp: , Rfl:  .  meloxicam (MOBIC) 7.5 MG tablet, Take 1 tablet (7.5 mg total) by mouth daily., Disp: 30 tablet, Rfl: 5 .  potassium  chloride (KLOR-CON) 10 MEQ tablet, TAKE ONE TABLET BY MOUTH ONCE DAILY., Disp: 30 tablet, Rfl: 0 .  rosuvastatin (CRESTOR) 40 MG tablet, Take 1 tablet (40 mg total) by mouth daily., Disp: 90 tablet, Rfl: 1 .  traZODone (DESYREL) 50 MG tablet, Take 1 tablet (50 mg total) by mouth at bedtime., Disp: 30 tablet, Rfl: 3 .  valsartan (DIOVAN) 160 MG tablet, Take 1 tablet (160 mg total) by mouth daily., Disp: 90 tablet, Rfl: 0 .  benztropine (COGENTIN) 1 MG tablet, Take 1-2 tablets (1-2 mg total) by mouth 2 (two) times daily. One tablet (1mg ) in the morning and two tablets (2mg ) at bedtime (Patient taking differently: Take 1 mg by mouth 3 (three) times daily. One tablet (1mg ) in the morning and two tablets (2mg ) at bedtime), Disp: 120 tablet, Rfl:  2 .  levETIRAcetam (KEPPRA) 500 MG tablet, Take 1 tablet (500 mg total) by mouth 2 (two) times daily., Disp: 60 tablet, Rfl: 1  Allergies  Allergen Reactions  . Percocet [Oxycodone-Acetaminophen] Diarrhea, Nausea And Vomiting and Nausea Only  . Tramadol Hcl Diarrhea, Nausea And Vomiting and Nausea Only  . Vicodin [Hydrocodone-Acetaminophen] Diarrhea, Nausea And Vomiting and Nausea Only    I personally reviewed active problem list, medication list, allergies, family history, social history, health maintenance with the patient/caregiver today.   ROS  Constitutional: Negative for fever or weight change.  Respiratory: Negative for cough , positive for  shortness of breath.   Cardiovascular: Negative for chest pain or palpitations.  Gastrointestinal: Negative for abdominal pain, no bowel changes.  Musculoskeletal: positive for gait problem and intermittent knee  joint swelling.  Skin: Negative for rash.  Neurological: Negative for dizziness or headache.  No other specific complaints in a complete review of systems (except as listed in HPI above).  Objective  Vitals:   01/22/21 1326  BP: 138/80  Pulse: 85  Resp: 18  Temp: 97.9 F (36.6 C)  TempSrc:  Oral  SpO2: 96%  Weight: 224 lb (101.6 kg)  Height: 5\' 4"  (1.626 m)    Body mass index is 38.45 kg/m.  Physical Exam  Constitutional: Patient appears well-developed and well-nourished. Obese  No distress.  HEENT: head atraumatic, normocephalic, pupils equal and reactive to light,  neck supple Cardiovascular: Normal rate, regular rhythm and normal heart sounds.  No murmur heard. No BLE edema. Pulmonary/Chest:tachypneic, with irregular breathing pattern, also stable  Abdominal: Soft.  There is no tenderness. Muscular Skeletal: crepitus with extension of right knee, no effusion or erythema, using a cane  Psychiatric: Patient has a normal mood and affect. behavior is normal. Judgment and thought content normal. Some dysarthria - baseline   Recent Results (from the past 2160 hour(s))  POCT HgB A1C     Status: Abnormal   Collection Time: 10/25/20  2:00 PM  Result Value Ref Range   Hemoglobin A1C 5.7 (A) 4.0 - 5.6 %   HbA1c POC (<> result, manual entry)     HbA1c, POC (prediabetic range)     HbA1c, POC (controlled diabetic range)    CULTURE, URINE COMPREHENSIVE     Status: Abnormal   Collection Time: 10/25/20  2:56 PM   Specimen: Urine  Result Value Ref Range   MICRO NUMBER: [id number]    SPECIMEN QUALITY: Adequate    Source OTHER (SPECIFY)    STATUS: FINAL    RESULT: Gram positive cocci isolated (A)     Comment: 10,000-49,000 CFU/mL of Gram positive cocci isolated May represent colonizers from external and internal genitalia. No further testing (including susceptibility) will be performed.     PHQ2/9: Depression screen [facility] 2/9 01/22/2021 11/30/2020 10/25/2020 08/08/2020 12/06/2019  Decreased Interest 1 0 0 0 0  Down, Depressed, Hopeless 0 0 0 0 0  PHQ - 2 Score 1 0 0 0 0  Altered sleeping - - - - 0  Tired, decreased energy - - - - 0  Change in appetite - - - - 0  Feeling bad or failure about yourself  - - - - 0  Trouble concentrating - - - - 0  Moving slowly or fidgety/restless  - - - - 0  Suicidal thoughts - - - - 0  PHQ-9 Score - - - - 0  Difficult doing work/chores - - - - -  Some recent data might be hidden  phq 9 is positive   Fall Risk: Fall Risk  01/22/2021 11/30/2020 10/25/2020 08/08/2020 04/24/2020  Falls in the past year? 1 0 0 1 -  Number falls in past yr: 0 0 0 1 -  Injury with Fall? 0 0 0 0 -  Risk for fall due to : - Impaired balance/gait - History of fall(s);Mental status change;Impaired balance/gait;Impaired mobility Impaired balance/gait;History of fall(s)  Follow up - Falls prevention discussed - Falls evaluation completed -     Functional Status Survey: Is the patient deaf or have difficulty hearing?: No Does the patient have difficulty seeing, even when wearing glasses/contacts?: No Does the patient have difficulty concentrating, remembering, or making decisions?: Yes Does the patient have difficulty walking or climbing stairs?: Yes Does the patient have difficulty dressing or bathing?: No Does the patient have difficulty doing errands alone such as visiting a doctor's office or shopping?: Yes    Assessment & Plan  1. Meningioma [facility])  Must follow up with Dr. Lacinda Axon   2. Dyslipidemia associated with type 2 diabetes mellitus (HCC)  - COMPLETE METABOLIC PANEL WITH GFR  3. Schizophrenia, paranoid ([place])   4. Seizure-like activity (HCC)  No recent episodes, on Keppra   5. Morbid obesity ([place])  Discussed with the patient the risk posed by an increased BMI. Discussed importance of portion control, calorie counting and at least 150 minutes of physical activity weekly. Avoid sweet beverages and drink more water. Eat at least 6 servings of fruit and vegetables daily   6. Atherosclerosis of aorta ([place])   7. IgA monoclonal gammopathy of uncertain significance   8. Dysarthria  Stable   9. OSA (obstructive sleep apnea)  We will order a sleep study  10. Essential hypertension  - valsartan (DIOVAN) 160 MG tablet; Take 1  tablet (160 mg total) by mouth daily.  Dispense: 90 tablet; Refill: 0  11. Obesity, diabetes, and hypertension syndrome ([place])   12. Irregular breathing pattern   13. Primary osteoarthritis of right knee  - Ambulatory referral to Orthopedic Surgery

## 2021-01-23 LAB — COMPLETE METABOLIC PANEL WITH GFR
AG Ratio: 1.3 (calc) (ref 1.0–2.5)
ALT: 10 U/L (ref 6–29)
AST: 14 U/L (ref 10–35)
Albumin: 4.1 g/dL (ref 3.6–5.1)
Alkaline phosphatase (APISO): 70 U/L (ref 37–153)
BUN/Creatinine Ratio: 13 (calc) (ref 6–22)
BUN: 13 mg/dL (ref 7–25)
CO2: 27 mmol/L (ref 20–32)
Calcium: 10.1 mg/dL (ref 8.6–10.4)
Chloride: 105 mmol/L (ref 98–110)
Creat: 1.01 mg/dL — ABNORMAL HIGH (ref 0.50–0.99)
GFR, Est African American: 66 mL/min/{1.73_m2} (ref >=60)
GFR, Est Non African American: 57 mL/min/{1.73_m2} — ABNORMAL LOW (ref >=60)
Globulin: 3.1 g/dL (calc) (ref 1.9–3.7)
Glucose, Bld: 106 mg/dL — ABNORMAL HIGH (ref 65–99)
Potassium: 3.6 mmol/L (ref 3.5–5.3)
Sodium: 141 mmol/L (ref 135–146)
Total Bilirubin: 0.7 mg/dL (ref 0.2–1.2)
Total Protein: 7.2 g/dL (ref 6.1–8.1)

## 2021-02-14 ENCOUNTER — Other Ambulatory Visit: Payer: Self-pay | Admitting: Family Medicine

## 2021-02-18 LAB — HM DIABETES EYE EXAM

## 2021-03-08 DIAGNOSIS — E785 Hyperlipidemia, unspecified: Secondary | ICD-10-CM | POA: Diagnosis not present

## 2021-03-08 DIAGNOSIS — G4733 Obstructive sleep apnea (adult) (pediatric): Secondary | ICD-10-CM | POA: Diagnosis not present

## 2021-03-08 DIAGNOSIS — E1142 Type 2 diabetes mellitus with diabetic polyneuropathy: Secondary | ICD-10-CM | POA: Diagnosis not present

## 2021-03-08 DIAGNOSIS — I10 Essential (primary) hypertension: Secondary | ICD-10-CM | POA: Diagnosis not present

## 2021-03-19 ENCOUNTER — Other Ambulatory Visit: Payer: Self-pay | Admitting: Family Medicine

## 2021-03-20 ENCOUNTER — Other Ambulatory Visit: Payer: Self-pay | Admitting: Family Medicine

## 2021-03-21 ENCOUNTER — Other Ambulatory Visit (HOSPITAL_COMMUNITY): Payer: Self-pay | Admitting: Neurosurgery

## 2021-03-21 ENCOUNTER — Other Ambulatory Visit: Payer: Self-pay | Admitting: Neurosurgery

## 2021-03-21 DIAGNOSIS — D329 Benign neoplasm of meninges, unspecified: Secondary | ICD-10-CM

## 2021-03-27 DIAGNOSIS — G2401 Drug induced subacute dyskinesia: Secondary | ICD-10-CM | POA: Diagnosis not present

## 2021-04-04 ENCOUNTER — Ambulatory Visit: Payer: Medicare Other | Admitting: Family Medicine

## 2021-04-17 ENCOUNTER — Other Ambulatory Visit: Payer: Self-pay

## 2021-04-17 ENCOUNTER — Ambulatory Visit (HOSPITAL_COMMUNITY)
Admission: RE | Admit: 2021-04-17 | Discharge: 2021-04-17 | Disposition: A | Discharge status: Home / Self Care | Payer: Medicare Other | Source: Ambulatory Visit | Attending: Neurosurgery | Admitting: Neurosurgery

## 2021-04-17 DIAGNOSIS — D329 Benign neoplasm of meninges, unspecified: Secondary | ICD-10-CM | POA: Insufficient documentation

## 2021-04-17 DIAGNOSIS — G936 Cerebral edema: Secondary | ICD-10-CM | POA: Diagnosis not present

## 2021-04-17 MED ORDER — GADOBUTROL 1 MMOL/ML IV SOLN
7.0000 mL | Freq: Once | INTRAVENOUS | Status: AC | PRN
  Administered 2021-04-17: 7 mL via INTRAVENOUS

## 2021-04-19 ENCOUNTER — Other Ambulatory Visit: Payer: Self-pay | Admitting: Family Medicine

## 2021-04-24 DIAGNOSIS — D329 Benign neoplasm of meninges, unspecified: Secondary | ICD-10-CM | POA: Diagnosis not present

## 2021-04-27 ENCOUNTER — Telehealth: Payer: Self-pay

## 2021-04-27 NOTE — Telephone Encounter (Signed)
Jade from [facility] requesting a fax to be sent over pertaining to retina diagnosis. She is needing all pages faxed over (P) [phone] (F) [phone]

## 2021-04-27 NOTE — Telephone Encounter (Signed)
Left message for Lauren [name] to call patients eye doctor to get the records pertaining to retina

## 2021-05-21 ENCOUNTER — Other Ambulatory Visit: Payer: Self-pay | Admitting: Family Medicine

## 2021-05-22 ENCOUNTER — Telehealth: Payer: Self-pay | Admitting: Family Medicine

## 2021-05-22 NOTE — Telephone Encounter (Signed)
Ou discontinued the lisinopril, but does she need to continue potassium?

## 2021-05-23 ENCOUNTER — Other Ambulatory Visit: Payer: Self-pay | Admitting: Family Medicine

## 2021-05-23 NOTE — Telephone Encounter (Signed)
Pt's family member [name] called in to follow up on request. Advised that pt shouldn't be taking Valsartan now, Rx was discontinued per notes. Friend says that she will make sure that pt is aware. [name] says that they were advised/suggested by the pharmacist to suggest a 12 month supply for her Rx so that pt doesn't have to request every month.    Please assist further.

## 2021-05-24 ENCOUNTER — Telehealth: Payer: Self-pay

## 2021-05-24 NOTE — Telephone Encounter (Signed)
Left VM for return call

## 2021-05-24 NOTE — Telephone Encounter (Signed)
Copied from [place] [phone]. Topic: General - Other >> May 24, 2021 10:43 AM Yvette Rack wrote: Reason for CRM: Pt returned call as requested. Pt request call back.

## 2021-06-18 DIAGNOSIS — E782 Mixed hyperlipidemia: Secondary | ICD-10-CM | POA: Diagnosis not present

## 2021-06-21 ENCOUNTER — Other Ambulatory Visit: Payer: Self-pay | Admitting: Family Medicine

## 2021-06-28 DIAGNOSIS — E782 Mixed hyperlipidemia: Secondary | ICD-10-CM | POA: Diagnosis not present

## 2021-07-10 ENCOUNTER — Other Ambulatory Visit (HOSPITAL_COMMUNITY): Payer: Self-pay | Admitting: Family Medicine

## 2021-07-10 DIAGNOSIS — Z1231 Encounter for screening mammogram for malignant neoplasm of breast: Secondary | ICD-10-CM

## 2021-07-18 ENCOUNTER — Other Ambulatory Visit: Payer: Self-pay | Admitting: Orthopedic Surgery

## 2021-07-18 DIAGNOSIS — M1711 Unilateral primary osteoarthritis, right knee: Secondary | ICD-10-CM

## 2021-07-19 ENCOUNTER — Ambulatory Visit (HOSPITAL_COMMUNITY)
Admission: RE | Admit: 2021-07-19 | Discharge: 2021-07-19 | Disposition: A | Discharge status: Home / Self Care | Payer: Medicare Other | Source: Ambulatory Visit | Attending: Family Medicine | Admitting: Family Medicine

## 2021-07-19 ENCOUNTER — Other Ambulatory Visit: Payer: Self-pay

## 2021-07-19 DIAGNOSIS — Z1231 Encounter for screening mammogram for malignant neoplasm of breast: Secondary | ICD-10-CM | POA: Diagnosis not present

## 2021-07-24 NOTE — Progress Notes (Signed)
Name: Lauren [name]   MRN: [id number]    DOB: Mar 12, 1951   Date:07/25/2021       Progress Note  Subjective  Chief Complaint  Follow Up  HPI  SOB/Tachypenia: seen by Dr. Lanney Gins, he felt like symptoms likely from extra pyramidal effects. Her breathing has been stable when compared to previous visit, has intermittent tachypnea. No wheezing or cough . 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   She is now living in  [place] with her cousin - Lauren [name] Her cousin Lauren [name] manages her money and paying her bills from now on.  She denies visual or auditory hallucinations She has medications pre-packaged and is compliant with oral medications also , however today she brought all her medications and psychiatric medications on bottles and other medications on bubble wrap we will contact pharmacy to find out what happened.    Recurrence of Meningioma: resection done in 2012 with developed of left side numbness, she went to [facility] and CT showed recurrence 01/2018 . She saw Dr. Lacinda Axon but she did not want to have repeat surgery therefore seen by Dr. Baruch Gouty and s/ p radiation.   She denies headaches, nausea, vomiting, problems with her vision but states she states tremors are intermittent now. History of seizures, taking Keppra and no symptoms in a long time. She saw Dr. Lacinda Axon this past Summer had another MRI brain that showed meniogioma is stable in size, follow up in 2 years No recent episodes of seizure like activity but has hand tremors    Aorta atherosclerosis: found on  CT chest done Nov 2018. She is on statin and we will recheck labs today .    DMII:  she is off Metformin and A1C has  gone up from 5.9 % to 6.2 % and we will continue to monitor.  Eye exam is up to date. She has polyphagia, polydipsia and polyuria. On statin therapy and valsartan, history of microalbuminuria, also has associated obesity.    HTN: she is off Linsopril and cough has improved, she is on Valsartan and tolerating it well. She denies chest pain or palpitation. BP is at goal today ,, she still has urinary urgency only first thing in am.  Morbid obesity: she has BMI above 40 , discussed importance of portion control and healthier diet   Right Knee pain: she states over the past few months she noticed intermittent anterior right knee pain, it is sharp or aching , it is  intermittent, she has been using a cane. She has some grinding on her right knee, she also has intermittent effusion but no redness.. She states the pain started after a fall at home, currently the pain is 0/10 but can go up to 8/10. She had left knee replaced in the past. She was already seen by sports medicine , Dr. Aline Brochure on 01/15/2021 , she was given meloxicam and advised to get steroid injection if no improvement We gave her a referral for second opinion in [place] but she did get to go yet   Patient Active Problem List   Diagnosis Date Noted   IgA gammopathy 04/26/2019  SS-A antibody positive 04/15/2019   Seizure-like activity ([place]) 12/29/2018   Seizures ([place]) 11/24/2018   Paresthesias 11/22/2018   Mouth dryness 10/12/2018   Hx of resection of meningioma 07/21/2018   Tremor 07/21/2018   Morbid obesity ([place]) 12/09/2017   Meningioma ([place]) 11/25/2017   Atherosclerosis of aorta ([place]) 11/25/2017   Calcification of coronary artery 11/25/2017   Schizophrenia ([place]) 11/04/2017   Acid reflux 06/29/2015   Diabetes mellitus type 2, controlled, without complications ([place]) 58/05/9832   Cardiac murmur 06/29/2015   Arthritis of knee, degenerative 06/28/2015   Insomnia w/ sleep apnea 03/21/2015   Anxiety disorder 03/21/2015    Hypertension 03/21/2015   HLD (hyperlipidemia) 03/21/2015   Urge incontinence 03/21/2015    Past Surgical History:  Procedure Laterality Date   BRAIN TUMOR EXCISION  2012   benign   CARDIAC CATHETERIZATION  2008   CESAREAN SECTION     CYST REMOVAL HAND     LUNG LOBECTOMY  1977   benign tumor   TOTAL KNEE ARTHROPLASTY Left 01/28/2017   Procedure: TOTAL KNEE ARTHROPLASTY;  Surgeon: Corky Mull, MD;  Location: [facility] ORS;  Service: Orthopedics;  Laterality: Left;    Family History  Problem Relation Age of Onset   Heart disease Brother    Depression Mother    Heart attack Mother    Stroke Mother    Alcohol abuse Father    Stroke Father    Diabetes Sister    Diabetes Sister    Stomach cancer Sister    Kidney disease Sister    COPD Brother    Lung cancer Brother    Diabetes Brother     Social History   Tobacco Use   Smoking status: Never   Smokeless tobacco: Never  Substance Use Topics   Alcohol use: No    Alcohol/week: 0.0 standard drinks     Current Outpatient Medications:    escitalopram (LEXAPRO) 10 MG tablet, Take 1 tablet (10 mg total) by mouth daily., Disp: 90 tablet, Rfl: 1   gabapentin (NEURONTIN) 100 MG capsule, Take 1 capsule (100 mg total) by mouth 2 (two) times daily., Disp: 180 capsule, Rfl: 1   haloperidol decanoate (HALDOL DECANOATE) 100 MG/ML injection, Inject 100 mg into the muscle every 28 (twenty-eight) days. , Disp: , Rfl:    meloxicam (MOBIC) 7.5 MG tablet, TAKE (1) TABLET BY MOUTH ONCE A DAY., Disp: 30 tablet, Rfl: 5   traZODone (DESYREL) 50 MG tablet, Take 1 tablet (50 mg total) by mouth at bedtime., Disp: 30 tablet, Rfl: 3   benztropine (COGENTIN) 1 MG tablet, Take 1-2 tablets (1-2 mg total) by mouth 2 (two) times daily. One tablet (1mg ) in the morning and two tablets (2mg ) at bedtime (Patient taking differently: Take 1 mg by mouth 3 (three) times daily. One tablet (1mg ) in the morning and two tablets (2mg ) at bedtime), Disp: 120 tablet, Rfl: 2    levETIRAcetam (KEPPRA) 500 MG tablet, Take 1 tablet (500 mg total) by mouth 2 (two) times daily., Disp: 60 tablet, Rfl: 1   rosuvastatin (CRESTOR) 40 MG tablet, Take 1 tablet (40 mg total) by mouth daily., Disp: 90 tablet, Rfl: 1   valsartan (DIOVAN) 160 MG tablet, Take 1 tablet (160 mg total) by mouth daily., Disp: 90 tablet, Rfl: 0  Allergies  Allergen Reactions   Percocet [Oxycodone-Acetaminophen] Diarrhea, Nausea And Vomiting and Nausea Only   Tramadol Hcl Diarrhea, Nausea And Vomiting and Nausea Only   Vicodin [Hydrocodone-Acetaminophen] Diarrhea, Nausea And Vomiting and Nausea Only  I personally reviewed active problem list, medication list, allergies, family history, social history, health maintenance with the patient/caregiver today.   ROS  Constitutional: Negative for fever or weight change.  Respiratory: Negative for cough and shortness of breath.   Cardiovascular: Negative for chest pain or palpitations.  Gastrointestinal: Negative for abdominal pain, no bowel changes.  Musculoskeletal: Positive for gait problem but no joint swelling.  Skin: Negative for rash.  Neurological: Negative for dizziness or headache.  No other specific complaints in a complete review of systems (except as listed in HPI above).   Objective  Vitals:   07/25/21 1331  BP: 138/70  Pulse: 96  Resp: 20  Temp: 98.1 F (36.7 C)  SpO2: 96%  Weight: 240 lb (108.9 kg)  Height: 5\' 4"  (1.626 m)    Body mass index is 41.2 kg/m.  Physical Exam  Constitutional: Patient appears well-developed and well-nourished. Obese  No distress.  HEENT: head atraumatic, normocephalic, pupils equal and reactive to light, neck supple Cardiovascular: Normal rate, regular rhythm and normal heart sounds.  No murmur heard. No BLE edema. Pulmonary/Chest: Effort normal and breath sounds normal. No respiratory distress. Abdominal: Soft.  There is no tenderness. Psychiatric: Patient has a normal mood and affect.  behavior is normal. Judgment and thought content normal.   Recent Results (from the past 2160 hour(s))  POCT HgB A1C     Status: Abnormal   Collection Time: 07/25/21  1:34 PM  Result Value Ref Range   Hemoglobin A1C 6.2 (A) 4.0 - 5.6 %   HbA1c POC (<> result, manual entry)     HbA1c, POC (prediabetic range)     HbA1c, POC (controlled diabetic range)        PHQ2/9: Depression screen [facility] 2/9 07/25/2021 01/22/2021 11/30/2020 10/25/2020 08/08/2020  Decreased Interest 0 1 0 0 0  Down, Depressed, Hopeless 0 0 0 0 0  PHQ - 2 Score 0 1 0 0 0  Altered sleeping 0 - - - -  Tired, decreased energy 0 - - - -  Change in appetite 0 - - - -  Feeling bad or failure about yourself  0 - - - -  Trouble concentrating 0 - - - -  Moving slowly or fidgety/restless 0 - - - -  Suicidal thoughts 0 - - - -  PHQ-9 Score 0 - - - -  Difficult doing work/chores - - - - -  Some recent data might be hidden    phq 9 is negative   Fall Risk: Fall Risk  07/25/2021 01/22/2021 11/30/2020 10/25/2020 08/08/2020  Falls in the past year? 0 1 0 0 1  Number falls in past yr: 0 0 0 0 1  Injury with Fall? 0 0 0 0 0  Risk for fall due to : No Fall Risks - Impaired balance/gait - History of fall(s);Mental status change;Impaired balance/gait;Impaired mobility  Follow up Falls prevention discussed - Falls prevention discussed - Falls evaluation completed      Functional Status Survey: Is the patient deaf or have difficulty hearing?: No Does the patient have difficulty seeing, even when wearing glasses/contacts?: No Does the patient have difficulty concentrating, remembering, or making decisions?: Yes Does the patient have difficulty walking or climbing stairs?: Yes Does the patient have difficulty dressing or bathing?: No Does the patient have difficulty doing errands alone such as visiting a doctor's office or shopping?: Yes    Assessment & Plan  1. Dyslipidemia associated with type 2 diabetes mellitus ([place])  -  POCT HgB A1C - HM Diabetes Foot Exam - COMPLETE METABOLIC PANEL WITH GFR  2. Need for immunization against influenza  - Flu Vaccine QUAD High Dose(Fluad)  3. Seizure-like activity ([place])   4. Meningioma (HCC)  Stable, keep follow up with Dr. Lacinda Axon  5. Schizophrenia, paranoid ([place])   6. Atherosclerosis of aorta (HCC)  - Lipid panel  7. Morbid obesity ([place])  Discussed with the patient the risk posed by an increased BMI. Discussed importance of portion control, calorie counting and at least 150 minutes of physical activity weekly. Avoid sweet beverages and drink more water. Eat at least 6 servings of fruit and vegetables daily    8. Essential hypertension  - CBC with Differential/Platelet - COMPLETE METABOLIC PANEL WITH GFR - valsartan (DIOVAN) 160 MG tablet; Take 1 tablet (160 mg total) by mouth daily.  Dispense: 90 tablet; Refill: 0  9. Diabetes mellitus type 2 in obese ([place])   10. Dysarthria   11. OSA (obstructive sleep apnea)  - Ambulatory referral to Sleep Studies  12. IgA monoclonal gammopathy of uncertain significance   13. Irregular breathing pattern  Improved during today's visit   14. Tremor of both hands  Stable   15. Primary osteoarthritis of right knee   16. Controlled type 2 diabetes mellitus with microalbuminuria, without long-term current use of insulin (HCC)  - Microalbumin / creatinine urine ratio

## 2021-07-25 ENCOUNTER — Other Ambulatory Visit: Payer: Self-pay

## 2021-07-25 ENCOUNTER — Other Ambulatory Visit: Payer: Self-pay | Admitting: Family Medicine

## 2021-07-25 ENCOUNTER — Ambulatory Visit (INDEPENDENT_AMBULATORY_CARE_PROVIDER_SITE_OTHER): Payer: Medicare Other | Admitting: Family Medicine

## 2021-07-25 ENCOUNTER — Encounter: Payer: Self-pay | Admitting: Family Medicine

## 2021-07-25 VITALS — BP 138/70 | HR 96 | Temp 98.1°F | Resp 20 | Ht 64.0 in | Wt 240.0 lb

## 2021-07-25 DIAGNOSIS — I700 Atherosclerosis of aorta: Secondary | ICD-10-CM | POA: Diagnosis not present

## 2021-07-25 DIAGNOSIS — E785 Hyperlipidemia, unspecified: Secondary | ICD-10-CM | POA: Diagnosis not present

## 2021-07-25 DIAGNOSIS — R569 Unspecified convulsions: Secondary | ICD-10-CM | POA: Diagnosis not present

## 2021-07-25 DIAGNOSIS — M1711 Unilateral primary osteoarthritis, right knee: Secondary | ICD-10-CM

## 2021-07-25 DIAGNOSIS — E1129 Type 2 diabetes mellitus with other diabetic kidney complication: Secondary | ICD-10-CM | POA: Diagnosis not present

## 2021-07-25 DIAGNOSIS — E1169 Type 2 diabetes mellitus with other specified complication: Secondary | ICD-10-CM

## 2021-07-25 DIAGNOSIS — Z23 Encounter for immunization: Secondary | ICD-10-CM | POA: Diagnosis not present

## 2021-07-25 DIAGNOSIS — E669 Obesity, unspecified: Secondary | ICD-10-CM

## 2021-07-25 DIAGNOSIS — G4733 Obstructive sleep apnea (adult) (pediatric): Secondary | ICD-10-CM

## 2021-07-25 DIAGNOSIS — R471 Dysarthria and anarthria: Secondary | ICD-10-CM | POA: Diagnosis not present

## 2021-07-25 DIAGNOSIS — R809 Proteinuria, unspecified: Secondary | ICD-10-CM

## 2021-07-25 DIAGNOSIS — D472 Monoclonal gammopathy: Secondary | ICD-10-CM | POA: Diagnosis not present

## 2021-07-25 DIAGNOSIS — I10 Essential (primary) hypertension: Secondary | ICD-10-CM

## 2021-07-25 DIAGNOSIS — R0689 Other abnormalities of breathing: Secondary | ICD-10-CM | POA: Diagnosis not present

## 2021-07-25 DIAGNOSIS — D329 Benign neoplasm of meninges, unspecified: Secondary | ICD-10-CM | POA: Diagnosis not present

## 2021-07-25 DIAGNOSIS — R251 Tremor, unspecified: Secondary | ICD-10-CM

## 2021-07-25 DIAGNOSIS — F200 Paranoid schizophrenia: Secondary | ICD-10-CM

## 2021-07-25 LAB — POCT GLYCOSYLATED HEMOGLOBIN (HGB A1C): Hemoglobin A1C: 6.2 % — AB (ref 4.0–5.6)

## 2021-07-25 MED ORDER — VALSARTAN 160 MG PO TABS: 160.0000 mg | ORAL_TABLET | Freq: Every day | ORAL | 0 refills | Status: DC

## 2021-07-25 MED ORDER — ROSUVASTATIN CALCIUM 40 MG PO TABS: 40.0000 mg | ORAL_TABLET | Freq: Every day | ORAL | 1 refills | Status: DC

## 2021-07-26 LAB — CBC WITH DIFFERENTIAL/PLATELET
Absolute Monocytes: 490 cells/uL (ref 200–950)
Basophils Absolute: 51 cells/uL (ref 0–200)
Basophils Relative: 1 %
Eosinophils Absolute: 51 cells/uL (ref 15–500)
Eosinophils Relative: 1 %
HCT: 40.2 % (ref 35.0–45.0)
Hemoglobin: 13.3 g/dL (ref 11.7–15.5)
Lymphs Abs: 2091 cells/uL (ref 850–3900)
MCH: 30.9 pg (ref 27.0–33.0)
MCHC: 33.1 g/dL (ref 32.0–36.0)
MCV: 93.5 fL (ref 80.0–100.0)
MPV: 10.5 fL (ref 7.5–12.5)
Monocytes Relative: 9.6 %
Neutro Abs: 2417 cells/uL (ref 1500–7800)
Neutrophils Relative %: 47.4 %
Platelets: 235 10*3/uL (ref 140–400)
RBC: 4.3 10*6/uL (ref 3.80–5.10)
RDW: 13.6 % (ref 11.0–15.0)
Total Lymphocyte: 41 %
WBC: 5.1 10*3/uL (ref 3.8–10.8)

## 2021-07-26 LAB — MICROALBUMIN / CREATININE URINE RATIO
Creatinine, Urine: 64 mg/dL (ref 20–275)
Microalb Creat Ratio: 27 mcg/mg creat (ref <30)
Microalb, Ur: 1.7 mg/dL

## 2021-07-26 LAB — COMPLETE METABOLIC PANEL WITH GFR
AG Ratio: 1.6 (calc) (ref 1.0–2.5)
ALT: 13 U/L (ref 6–29)
AST: 16 U/L (ref 10–35)
Albumin: 4.1 g/dL (ref 3.6–5.1)
Alkaline phosphatase (APISO): 59 U/L (ref 37–153)
BUN: 10 mg/dL (ref 7–25)
CO2: 26 mmol/L (ref 20–32)
Calcium: 9.7 mg/dL (ref 8.6–10.4)
Chloride: 106 mmol/L (ref 98–110)
Creat: 0.66 mg/dL (ref 0.50–1.05)
Globulin: 2.6 g/dL (calc) (ref 1.9–3.7)
Glucose, Bld: 95 mg/dL (ref 65–99)
Potassium: 4 mmol/L (ref 3.5–5.3)
Sodium: 140 mmol/L (ref 135–146)
Total Bilirubin: 0.6 mg/dL (ref 0.2–1.2)
Total Protein: 6.7 g/dL (ref 6.1–8.1)
eGFR: 95 mL/min/{1.73_m2} (ref >=60)

## 2021-07-26 LAB — LIPID PANEL
Cholesterol: 151 mg/dL (ref <200)
HDL: 52 mg/dL (ref >=50)
LDL Cholesterol (Calc): 84 mg/dL (calc)
Non-HDL Cholesterol (Calc): 99 mg/dL (calc) (ref <130)
Total CHOL/HDL Ratio: 2.9 (calc) (ref <5.0)
Triglycerides: 71 mg/dL (ref <150)

## 2021-07-27 ENCOUNTER — Other Ambulatory Visit: Payer: Self-pay | Admitting: Family Medicine

## 2021-07-30 ENCOUNTER — Telehealth: Payer: Self-pay

## 2021-07-30 NOTE — Telephone Encounter (Signed)
Copied from [place] [id number]. Topic: General - Other >> Jul 30, 2021 10:55 AM Leward Quan A wrote: Reason for CRM: Patient called in needing to discuss her lab results since she have been taken off her diabetic medication patient want to know if everything is ok. Please call Ph#   [phone]

## 2021-07-30 NOTE — Telephone Encounter (Signed)
Lab results given. Pt gave verbal understanding

## 2021-08-09 ENCOUNTER — Telehealth: Payer: Self-pay

## 2021-08-09 NOTE — Telephone Encounter (Signed)
Copied from [place] [id number]. Topic: General - Other >> Aug 09, 2021  3:34 PM Tessa Lerner A wrote: Reason for CRM: The patient has called to request the prescription for their diabetic shoes be sent to [facility] in [place]   Please contact the patient further if needed

## 2021-08-10 NOTE — Telephone Encounter (Signed)
Placed rx to be signed from Dr. Ancil Boozer, once signed will fax.

## 2021-08-17 ENCOUNTER — Other Ambulatory Visit: Payer: Self-pay | Admitting: Family Medicine

## 2021-08-20 ENCOUNTER — Telehealth: Payer: Self-pay | Admitting: Family Medicine

## 2021-08-23 DIAGNOSIS — G2401 Drug induced subacute dyskinesia: Secondary | ICD-10-CM | POA: Diagnosis not present

## 2021-08-23 NOTE — Telephone Encounter (Signed)
Pt called, advised her to call psychiatrist about refill for Keppra. She says that she is supposed to have 3 drs and she still sees the psychiatrist. She states she would have to wait for [name] to get back home so she can call for her. I advised her to see if they will refill medication and if not to call back to let us know. Pt said she would but she is also asking about the rx for her diabetic shoes that she called in about last week. She says she hasn't heard anything back on that and found the rx in her purse. I told her I would send Dr. Ancil Boozer a message to follow back up with her on the rx for the diabetic shoes. Pt verbalized understanding to call today.

## 2021-08-23 NOTE — Telephone Encounter (Signed)
Pt is going to f/up with psychiatrist on med refill.   Requested Prescriptions  Pending Prescriptions Disp Refills   levETIRAcetam (KEPPRA) 500 MG tablet 60 tablet 1    Sig: Take 1 tablet (500 mg total) by mouth 2 (two) times daily.     Not Delegated - Neurology:  Anticonvulsants Failed - 08/23/2021  2:44 PM      Failed - This refill cannot be delegated      Passed - HCT in normal range and within 360 days    HCT  Date Value Ref Range Status  07/25/2021 40.2 35.0 - 45.0 % Final  01/24/2014 39.9 35.0 - 47.0 % Final         Passed - HGB in normal range and within 360 days    Hemoglobin  Date Value Ref Range Status  07/25/2021 13.3 11.7 - 15.5 g/dL Final   HGB  Date Value Ref Range Status  01/24/2014 13.2 12.0 - 16.0 g/dL Final         Passed - PLT in normal range and within 360 days    Platelets  Date Value Ref Range Status  07/25/2021 235 140 - 400 Thousand/uL Final   Platelet  Date Value Ref Range Status  01/24/2014 254 150 - 440 x10 3/mm 3 Final         Passed - WBC in normal range and within 360 days    WBC  Date Value Ref Range Status  07/25/2021 5.1 3.8 - 10.8 Thousand/uL Final         Passed - Valid encounter within last 12 months    Recent Outpatient Visits          4 weeks ago Dyslipidemia associated with type 2 diabetes mellitus [facility])   [facility] Steele Sizer, MD   7 months ago Meningioma [facility])   [facility], Drue Stager, MD   10 months ago Type 2 diabetes mellitus with obesity [facility])   [facility] Steele Sizer, MD   1 year ago Right shoulder pain, unspecified chronicity   [facility] Delsa Grana, PA-C   1 year ago Type 2 diabetes mellitus with obesity [facility])   [facility] Steele Sizer, MD      Future Appointments            In 3 months  [place]   In 5 months Steele Sizer, MD [facility]           Refused Prescriptions Disp Refills   benztropine (COGENTIN) 1 MG tablet [Pharmacy Med Name: BENZTROPINE MESYLATE 1MG  TAB] 90 tablet 0    Sig: TAKE 1 TABLET BY MOUTH IN THE MORNING AND 2 TABLETS IN THE EVENING.     Neurology: Parkinsonian Agents - benztropine mesylate Passed - 08/23/2021  2:44 PM      Passed - Last Heart Rate in normal range    Pulse Readings from Last 1 Encounters:  07/25/21 96         Passed - Valid encounter within last 12 months    Recent Outpatient Visits          4 weeks ago Dyslipidemia associated with type 2 diabetes mellitus [facility])   [facility] Sizer, MD   7 months ago Meningioma [facility])   [facility] Sizer, MD   10 months ago Type 2 diabetes mellitus with obesity ([place])   [place]  [facility] Steele Sizer, MD   1 year ago Right shoulder pain, unspecified chronicity   [facility] Delsa Grana, [facility]-C   1 year ago Type 2 diabetes mellitus with obesity [facility])   [facility] Steele Sizer, MD      Future Appointments            In 3 months  [facility], [place]   In 5 months [facility], [facility]            levETIRAcetam (KEPPRA) 500 MG tablet [Pharmacy Med Name: LEVETIRACETAM 500MG  TABLET] 60 tablet 0    Sig: TAKE (1) TABLET BY MOUTH TWICE DAILY.     Not Delegated - Neurology:  Anticonvulsants Failed - 08/23/2021  2:44 PM      Failed - This refill cannot be delegated      Passed - HCT in normal range and within 360 days    HCT  Date Value Ref Range Status  07/25/2021 40.2 35.0 - 45.0 % Final  01/24/2014 39.9 35.0 - 47.0 % Final         Passed - HGB in normal range and within 360 days    Hemoglobin  Date Value Ref Range Status  07/25/2021 13.3 11.7 - 15.5 g/dL Final   HGB  Date Value Ref Range Status  01/24/2014 13.2 12.0 - 16.0 g/dL Final          Passed - PLT in normal range and within 360 days    Platelets  Date Value Ref Range Status  07/25/2021 235 140 - 400 Thousand/uL Final   Platelet  Date Value Ref Range Status  01/24/2014 254 150 - 440 x10 3/mm 3 Final         Passed - WBC in normal range and within 360 days    WBC  Date Value Ref Range Status  07/25/2021 5.1 3.8 - 10.8 Thousand/uL Final         Passed - Valid encounter within last 12 months    Recent Outpatient Visits          4 weeks ago Dyslipidemia associated with type 2 diabetes mellitus [facility])   [facility] Sizer, MD   7 months ago Meningioma [facility])   [facility] [place], Drue Stager, MD   10 months ago Type 2 diabetes mellitus with obesity [facility])   [facility] Steele Sizer, MD   1 year ago Right shoulder pain, unspecified chronicity   [facility] Delsa Gr[facility], [facility]-C   1 year ago Type 2 diabetes mellitus with obesity [facility])   [facility] Steele Sizer, MD      Future Appointments            In 3 months  [facility], [place]   In 5 months Steele Sizer, MD [facility], [facility]

## 2021-08-23 NOTE — Telephone Encounter (Signed)
Patient's cousin [name] called in trying to get levETIRAcetam (KEPPRA) 500 MG tablet refilled that shows ended. Please call back

## 2021-09-27 ENCOUNTER — Telehealth: Payer: Self-pay

## 2021-09-27 NOTE — Telephone Encounter (Signed)
Spoke with Patient and left a message for the medical equipment coordinator at [facility] to return call in regards to filling the prescription for Patient to have diabetic shoes per Dr. Ancil Boozer' prescription on 08/10/2021. Awaiting return call.

## 2021-10-18 ENCOUNTER — Other Ambulatory Visit: Payer: Self-pay | Admitting: Family Medicine

## 2021-10-18 DIAGNOSIS — I10 Essential (primary) hypertension: Secondary | ICD-10-CM

## 2021-10-18 NOTE — Telephone Encounter (Signed)
Requested Prescriptions  Pending Prescriptions Disp Refills   valsartan (DIOVAN) 160 MG tablet [Pharmacy Med Name: VALSARTAN 160 MG TAB] 90 tablet 0    Sig: TAKE ONE TABLET BY MOUTH DAILY.     Cardiovascular:  Angiotensin Receptor Blockers Passed - 10/18/2021  5:14 PM      Passed - Cr in normal range and within 180 days    Creat  Date Value Ref Range Status  07/25/2021 0.66 0.50 - 1.05 mg/dL Final   Creatinine, POC  Date Value Ref Range Status  05/30/2016 Negative mg/dL Final   Creatinine, Urine  Date Value Ref Range Status  07/25/2021 64 20 - 275 mg/dL Final         Passed - K in normal range and within 180 days    Potassium  Date Value Ref Range Status  07/25/2021 4.0 3.5 - 5.3 mmol/L Final  01/24/2014 3.6 3.5 - 5.1 mmol/L Final         Passed - Patient is not pregnant      Passed - Last BP in normal range    BP Readings from Last 1 Encounters:  07/25/21 138/70         Passed - Valid encounter within last 6 months    Recent Outpatient Visits          2 months ago Dyslipidemia associated with type 2 diabetes mellitus [facility])   [facility] Sizer, MD   8 months ago Meningioma [facility])   [facility]izer, MD   11 months ago Type 2 diabetes mellitus with obesity [facility])   [facility] Sizer, MD   1 year ago Right shoulder pain, unspecified chronicity   [facility] Dels[facility] Gr[facility], PA-C   1 year ago Type 2 diabetes mellitus with obesity [facility])   [facility] Steele Sizer, MD      Future Appointments            In 1 month  [facility], [place]   In 3 months Steele Sizer, MD [facility], [facility]

## 2021-12-06 ENCOUNTER — Ambulatory Visit: Payer: Medicare Other

## 2022-01-02 ENCOUNTER — Telehealth: Payer: Self-pay | Admitting: Family Medicine

## 2022-01-02 NOTE — Telephone Encounter (Signed)
Copied from [place] [id number]. Topic: Medicare AWV ?>> Jan 02, 2022  2:16 PM Cher Nakai R wrote: ?Reason for CRM:  ?No answer unable to leave a  message for patient to call back and schedule Medicare Annual Wellness Visit (AWV) in office.  ? ?If unable to come into the office for AWV,  please offer to do virtually or by telephone. ? ?Last AWV: 11/30/2020 ? ?Please schedule at anytime with [place]. ? ?30 minute appointment for Virtual or phone ?45 minute appointment for in office or Initial virtual/phone ? ?Any questions, please contact me at [phone] ?

## 2022-01-02 NOTE — Telephone Encounter (Signed)
Copied from [place] [id number]. Topic: Medicare AWV ?>> Jan 02, 2022  2:16 PM Cher Nakai R wrote: ?Reason for CRM:  ?No answer unable to leave a  message for patient to call back and schedule Medicare Annual Wellness Visit (AWV) in office.  ? ?If unable to come into the office for AWV,  please offer to do virtually or by telephone. ? ?Last AWV: 11/30/2020 ? ?Please schedule at anytime with [place]. ? ?30 minute appointment for Virtual or phone ?45 minute appointment for in office or Initial virtual/phone ? ?Any questions, please contact me at [phone] ?

## 2022-01-15 ENCOUNTER — Other Ambulatory Visit: Payer: Self-pay | Admitting: Orthopedic Surgery

## 2022-01-15 DIAGNOSIS — M1711 Unilateral primary osteoarthritis, right knee: Secondary | ICD-10-CM

## 2022-01-21 NOTE — Progress Notes (Signed)
seName: Lauren [name]   MRN: [id number]    DOB: 1951/06/17   Date:01/22/2022 ? ?     Progress Note ? ?Subjective ? ?Chief Complaint ? ?Follow up  ? ?HPI ? ?SOB/Tachypenia: seen by Dr. Lanney Gins, he felt like symptoms likely from extra pyramidal effects. Her breathing has been improving, she  has intermittent tachypnea. No wheezing or cough . She uses a paper bag prn when feels more SOB. She states she has noticed worsening of symptoms when she gest upset  ?  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 ?Schizoaffective disorder: she has been doing well on Haldol monthly , she states nurse [name]) goes to her house to give her the injections.   She is now living in [place] with her cousin - Lauren [name]  who manages her money and continues to pay her bills. She denies visual or auditory hallucinations She has medications pre-packaged and is compliant with oral medications and haldol monthly  ?  ?Recurrence of Meningioma: resection done in 2012 with developed of left side numbness, she went to [facility] and CT showed recurrence 01/2018 . She saw Dr. Lacinda Axon but she did not want to have repeat surgery therefore seen by Dr. Baruch Gouty and s/ p radiation.   She denies headaches, nausea, vomiting, problems with her vision but states she states tremors are intermittent now. History of seizures, taking Keppra and no symptoms in a long time. She saw Dr. Lacinda Axon this past Summer had another MRI brain that showed meniogioma is stable in size, follow up in 2 years No recent episodes of seizure like activity but has hand tremors  ? ?MRI brain done 04/17/2021 ?1. Stable appearance and size of 1.6 cm left frontal convexity ?meningioma and subjacent vasogenic edema. ?2. Stable chronic white matter disease. ?  ?Aorta atherosclerosis: found on  CT chest done Nov 2018. She is on statin and we will Last labs done Nov  2022 and at goal  ?  ?DMII:  she is off Metformin and A1C has gone up from 5.9 % to 6.2 % and today is 6.4 %  and we will continue to monitor.  Eye exam is up to date. She has polyphagia, polydipsia and polyuria. On statin therapy and valsartan, history of microalbuminuria, but last level was normal ? ?HTN: she is on Valsartan and tolerating it well. She denies chest pain or palpitation. BP is at goal today. ? ?Morbid obesity: BMI above 35 with co-morbidities such as DM, HTN and knee pain, discussed importance of portion control and healthier diet .  ? ?Right Knee pain: she states over the past few months she noticed intermittent anterior right knee pain, it is sharp or aching , it is  intermittent, she has been using a cane. She has some grinding on her right knee, but no recent erythema or increase in warmth She states the pain started after a fall at home, currently the pain is 0/10 but can go up to 8/10. She had left knee replaced in the past. She was already seen by sports medicine , Dr. Aline Brochure on 01/15/2021 , she was given meloxicam and advised to had a  steroid injections, she states the pain is not as severe now down to 5/10 and does not want to go back to Ortho at this time  ? ? ?Patient Active Problem  List  ? Diagnosis Date Noted  ? IgA gammopathy 04/26/2019  ? SS-A antibody positive 04/15/2019  ? Seizure-like activity ([place]) 12/29/2018  ? Seizures ([place]) 11/24/2018  ? Paresthesias 11/22/2018  ? Mouth dryness 10/12/2018  ? Hx of resection of meningioma 07/21/2018  ? Tremor 07/21/2018  ? Morbid obesity ([place]) 12/09/2017  ? Meningioma ([place]) 11/25/2017  ? Atherosclerosis of aorta ([place]) 11/25/2017  ? Calcification of coronary artery 11/25/2017  ? Schizophrenia ([place]) 11/04/2017  ? Acid reflux 06/29/2015  ? Diabetes mellitus type 2, controlled, without complications ([place]) 26/37/8588  ? Cardiac murmur 06/29/2015  ? Arthritis of knee, degenerative 06/28/2015  ? Insomnia w/ sleep apnea 03/21/2015  ? Anxiety disorder  03/21/2015  ? Hypertension 03/21/2015  ? HLD (hyperlipidemia) 03/21/2015  ? Urge incontinence 03/21/2015  ? ? ?Past Surgical History:  ?Procedure Laterality Date  ? BRAIN TUMOR EXCISION  2012  ? benign  ? CARDIAC CATHETERIZATION  2008  ? CESAREAN SECTION    ? CYST REMOVAL HAND    ? LUNG LOBECTOMY  1977  ? benign tumor  ? TOTAL KNEE ARTHROPLASTY Left 01/28/2017  ? Procedure: TOTAL KNEE ARTHROPLASTY;  Surgeon: Corky Mull, MD;  Location: [facility] ORS;  Service: Orthopedics;  Laterality: Left;  ? ? ?Family History  ?Problem Relation Age of Onset  ? Heart disease Brother   ? Depression Mother   ? Heart attack Mother   ? Stroke Mother   ? Alcohol abuse Father   ? Stroke Father   ? Diabetes Sister   ? Diabetes Sister   ? Stomach cancer Sister   ? Kidney disease Sister   ? COPD Brother   ? Lung cancer Brother   ? Diabetes Brother   ? ? ?Social History  ? ?Tobacco Use  ? Smoking status: Never  ? Smokeless tobacco: Never  ?Substance Use Topics  ? Alcohol use: No  ?  Alcohol/week: 0.0 standard drinks  ? ? ? ?Current Outpatient Medications:  ?  albuterol (VENTOLIN HFA) 108 (90 Base) MCG/ACT inhaler, Inhale 2 puffs into the lungs every 4 (four) hours as needed., Disp: , Rfl:  ?  escitalopram (LEXAPRO) 10 MG tablet, Take 1 tablet (10 mg total) by mouth daily., Disp: 90 tablet, Rfl: 1 ?  gabapentin (NEURONTIN) 100 MG capsule, Take 1 capsule (100 mg total) by mouth 2 (two) times daily., Disp: 180 capsule, Rfl: 1 ?  meloxicam (MOBIC) 7.5 MG tablet, TAKE (1) TABLET BY MOUTH ONCE A DAY., Disp: 30 tablet, Rfl: 0 ?  QC LO-DOSE ASPIRIN 81 MG EC tablet, TAKE 1 TABLET BY MOUTHTONCE A DAY., Disp: , Rfl:  ?  rosuvastatin (CRESTOR) 40 MG tablet, Take 1 tablet (40 mg total) by mouth daily., Disp: 90 tablet, Rfl: 1 ?  traZODone (DESYREL) 50 MG tablet, Take 1 tablet (50 mg total) by mouth at bedtime., Disp: 30 tablet, Rfl: 3 ?  valsartan (DIOVAN) 160 MG tablet, TAKE ONE TABLET BY MOUTH DAILY., Disp: 90 tablet, Rfl: 0 ?  benztropine (COGENTIN) 1 MG  tablet, Take 1-2 tablets (1-2 mg total) by mouth 2 (two) times daily. One tablet ('1mg'$ ) in the morning and two tablets ('2mg'$ ) at bedtime (Patient taking differently: Take 1 mg by mouth 3 (three) times daily. One tablet ('1mg'$ ) in the morning and two tablets ('2mg'$ ) at bedtime), Disp: 120 tablet, Rfl: 2 ?  haloperidol decanoate (HALDOL DECANOATE) 100 MG/ML injection, Inject 100 mg into the muscle every 28 (twenty-eight) days.  (Patient not taking: Reported on 01/22/2022), Disp: , Rfl:  ?  levETIRAcetam (KEPPRA) 500 MG tablet, Take 1 tablet (500 mg total) by mouth 2 (two) times daily., Disp: 60 tablet, Rfl: 1 ? ?Allergies  ?Allergen Reactions  ? Percocet [Oxycodone-Acetaminophen] Diarrhea, Nausea And Vomiting and Nausea Only  ? Tramadol Hcl Diarrhea, Nausea And Vomiting and Nausea Only  ? Vicodin [Hydrocodone-Acetaminophen] Diarrhea, Nausea And Vomiting and Nausea Only  ? ? ?I personally reviewed active problem list, medication list, allergies, family history, social history with the patient/caregiver today. ? ? ?ROS ? ?Constitutional: Negative for fever or weight change.  ?Respiratory: Negative for cough and shortness of breath.   ?Cardiovascular: Negative for chest pain or palpitations.  ?Gastrointestinal: Negative for abdominal pain, no bowel changes.  ?Musculoskeletal: Negative for gait problem or joint swelling.  ?Skin: positive  for rash.  ?Neurological: Negative for dizziness or headache.  ?No other specific complaints in a complete review of systems (except as listed in HPI above).  ? ?Objective ? ?Vitals:  ? 01/22/22 1436  ?BP: 126/86  ?Pulse: 95  ?Resp: (!) 22  ?Temp: 98 ?F (36.7 ?C)  ?TempSrc: Oral  ?SpO2: 94%  ?Weight: 238 lb (108 kg)  ?Height: '5\' 5"'$  (1.651 m)  ? ? ?Body mass index is 39.61 kg/m?. ? ?Physical Exam ? ?Constitutional: Patient appears well-developed and well-nourished. Obese  No distress.  ?HEENT: head atraumatic, normocephalic, pupils equal and reactive to light, neck supple ?Cardiovascular: Normal  rate, regular rhythm and normal heart sounds.  No murmur heard. No BLE edema. ?Pulmonary/Chest: Effort normal and breath sounds normal. No respiratory distress.intermittent tachypnea  ?Skin: facia

## 2022-01-22 ENCOUNTER — Ambulatory Visit (INDEPENDENT_AMBULATORY_CARE_PROVIDER_SITE_OTHER): Payer: Medicare Other

## 2022-01-22 ENCOUNTER — Encounter: Payer: Self-pay | Admitting: Family Medicine

## 2022-01-22 ENCOUNTER — Ambulatory Visit (INDEPENDENT_AMBULATORY_CARE_PROVIDER_SITE_OTHER): Payer: Medicare Other | Admitting: Family Medicine

## 2022-01-22 VITALS — BP 126/86 | HR 95 | Temp 98.0°F | Resp 22 | Ht 65.0 in | Wt 238.0 lb

## 2022-01-22 DIAGNOSIS — E1169 Type 2 diabetes mellitus with other specified complication: Secondary | ICD-10-CM | POA: Diagnosis not present

## 2022-01-22 DIAGNOSIS — I10 Essential (primary) hypertension: Secondary | ICD-10-CM

## 2022-01-22 DIAGNOSIS — Z0000 Encounter for general adult medical examination without abnormal findings: Secondary | ICD-10-CM | POA: Diagnosis not present

## 2022-01-22 DIAGNOSIS — Z1211 Encounter for screening for malignant neoplasm of colon: Secondary | ICD-10-CM

## 2022-01-22 DIAGNOSIS — R569 Unspecified convulsions: Secondary | ICD-10-CM | POA: Diagnosis not present

## 2022-01-22 DIAGNOSIS — I700 Atherosclerosis of aorta: Secondary | ICD-10-CM

## 2022-01-22 DIAGNOSIS — D329 Benign neoplasm of meninges, unspecified: Secondary | ICD-10-CM

## 2022-01-22 DIAGNOSIS — E785 Hyperlipidemia, unspecified: Secondary | ICD-10-CM | POA: Diagnosis not present

## 2022-01-22 DIAGNOSIS — F2089 Other schizophrenia: Secondary | ICD-10-CM

## 2022-01-22 DIAGNOSIS — M170 Bilateral primary osteoarthritis of knee: Secondary | ICD-10-CM

## 2022-01-22 DIAGNOSIS — L219 Seborrheic dermatitis, unspecified: Secondary | ICD-10-CM

## 2022-01-22 DIAGNOSIS — Z23 Encounter for immunization: Secondary | ICD-10-CM

## 2022-01-22 LAB — POCT GLYCOSYLATED HEMOGLOBIN (HGB A1C): Hemoglobin A1C: 6.4 % — AB (ref 4.0–5.6)

## 2022-01-22 MED ORDER — TETANUS-DIPHTH-ACELL PERTUSSIS 5-2-15.5 LF-MCG/0.5 IM SUSP: 0.5000 mL | Freq: Once | INTRAMUSCULAR | 0 refills | Status: AC

## 2022-01-22 MED ORDER — DESONIDE 0.05 % EX CREA: TOPICAL_CREAM | Freq: Two times a day (BID) | CUTANEOUS | 2 refills | Status: DC

## 2022-01-22 MED ORDER — ROSUVASTATIN CALCIUM 40 MG PO TABS: 40.0000 mg | ORAL_TABLET | Freq: Every day | ORAL | 1 refills | Status: DC

## 2022-01-22 MED ORDER — VALSARTAN 160 MG PO TABS: 160.0000 mg | ORAL_TABLET | Freq: Every day | ORAL | 1 refills | Status: DC

## 2022-01-22 MED ORDER — SHINGRIX 50 MCG/0.5ML IM SUSR: 0.5000 mL | Freq: Once | INTRAMUSCULAR | 0 refills | Status: AC

## 2022-01-22 NOTE — Progress Notes (Signed)
? ?Subjective:  ? Lauren [name] is a 71 y.o. female who presents for Medicare Annual (Subsequent) preventive examination. ? ?Review of Systems    ? ?Cardiac Risk Factors include: advanced age (>21mn, >>49women);diabetes mellitus;hypertension;obesity (BMI >30kg/m2) ? ?   ?Objective:  ?  ?Today's Vitals  ? 01/22/22 1505 01/22/22 1506  ?BP: 126/86   ?Pulse: 95   ?Resp: (!) 22   ?Temp: 98 ?F (36.7 ?C)   ?TempSrc: Oral   ?SpO2: 94%   ?Weight: 238 lb (108 kg)   ?Height: '5\' 5"'$  (1.651 m)   ?PainSc:  5   ? ?Body mass index is 39.61 kg/m?. ? ? ?  01/22/2022  ?  3:12 PM 10/27/2020  ?  4:11 PM 09/16/2019  ?  3:25 PM 06/24/2019  ? 10:55 PM 05/03/2019  ? 11:07 AM 04/26/2019  ?  1:55 PM 04/07/2019  ?  7:00 PM  ?Advanced Directives  ?Does Patient Have a Medical Advance Directive? Yes No No No No No No  ?Type of AParamedicof ATropicLiving will        ?Does patient want to make changes to medical advance directive?      No - Guardian declined No - Guardian declined  ?Copy of [place] Chart? Yes - validated most recent copy scanned in chart (See row information)        ?Would patient like information on creating a medical advance directive?   No - Patient declined No - Patient declined No - Patient declined No - Patient declined   ? ? ?Current Medications (verified) ?Outpatient Encounter Medications as of 01/22/2022  ?Medication Sig  ? albuterol (VENTOLIN HFA) 108 (90 Base) MCG/ACT inhaler Inhale 2 puffs into the lungs every 4 (four) hours as needed.  ? escitalopram (LEXAPRO) 10 MG tablet Take 1 tablet (10 mg total) by mouth daily.  ? gabapentin (NEURONTIN) 100 MG capsule Take 1 capsule (100 mg total) by mouth 2 (two) times daily.  ? meloxicam (MOBIC) 7.5 MG tablet TAKE (1) TABLET BY MOUTH ONCE A DAY.  ? QC LO-DOSE ASPIRIN 81 MG EC tablet TAKE 1 TABLET BY MOUTHTONCE A DAY.  ? rosuvastatin (CRESTOR) 40 MG tablet Take 1 tablet (40 mg total) by mouth daily.  ? traZODone (DESYREL) 50  MG tablet Take 1 tablet (50 mg total) by mouth at bedtime.  ? valsartan (DIOVAN) 160 MG tablet TAKE ONE TABLET BY MOUTH DAILY.  ? benztropine (COGENTIN) 1 MG tablet Take 1-2 tablets (1-2 mg total) by mouth 2 (two) times daily. One tablet ('1mg'$ ) in the morning and two tablets ('2mg'$ ) at bedtime (Patient taking differently: Take 1 mg by mouth 3 (three) times daily. One tablet ('1mg'$ ) in the morning and two tablets ('2mg'$ ) at bedtime)  ? haloperidol decanoate (HALDOL DECANOATE) 100 MG/ML injection Inject 100 mg into the muscle every 28 (twenty-eight) days.  (Patient not taking: Reported on 01/22/2022)  ? levETIRAcetam (KEPPRA) 500 MG tablet Take 1 tablet (500 mg total) by mouth 2 (two) times daily.  ? ?No facility-administered encounter medications on file as of 01/22/2022.  ? ? ?Allergies (verified) ?Percocet [oxycodone-acetaminophen], Tramadol hcl, and Vicodin [hydrocodone-acetaminophen]  ? ?History: ?Past Medical History:  ?Diagnosis Date  ? Anxiety   ? Apnea, sleep 02/02/2014  ? Awareness of heartbeats 02/02/2014  ? Breathlessness on exertion 02/02/2014  ? Diabetes mellitus   ? Encounter for pre-employment examination 06/29/2015  ? Excessive sweating 07/05/2015  ? Fibromyalgia   ? GERD (gastroesophageal reflux disease)   ?  Gravida 1 10/26/2015  ? 1.    ? Heart murmur   ? Herniated disc   ? Hyperlipidemia   ? Hypertension   ? Itch of skin 10/26/2015  ? Lack of bladder control   ? Lung tumor   ? Rheumatoid arthritis ([place])   ? Schizoaffective disorder ([place])   ? Screening for cervical cancer 07/29/2017  ? Seizure ([place])   ? after brain surgery 2012. last seizure 2013!  ? Sex counseling 10/26/2015  ? Sleep apnea   ? 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 Stiffness of both knees 06/22/2015  ? Stroke [facility])   ? ?Past Surgical History:  ?Procedure Laterality Date  ? BRAIN TUMOR EXCISION  2012  ? benign  ? CARDIAC CATHETERIZATION  2008  ? CESAREAN SECTION    ? CYST REMOVAL HAND    ? LUNG LOBECTOMY  1977  ? benign  tumor  ? TOTAL KNEE ARTHROPLASTY Left 01/28/2017  ? Procedure: TOTAL KNEE ARTHROPLASTY;  Surgeon: Corky Mull, MD;  Location: [facility] ORS;  Service: Orthopedics;  Laterality: Left;  ? ?Family History  ?Problem Relation Age of Onset  ? Heart disease Brother   ? Depression Mother   ? Heart attack Mother   ? Stroke Mother   ? Alcohol abuse Father   ? Stroke Father   ? Diabetes Sister   ? Diabetes Sister   ? Stomach cancer Sister   ? Kidney disease Sister   ? COPD Brother   ? Lung cancer Brother   ? Diabetes Brother   ? ?Social History  ? ?Socioeconomic History  ? Marital status: Single  ?  Spouse name: Not on file  ? Number of children: 1  ? Years of education: Not on file  ? Highest education level: 12th grade  ?Occupational History  ? Occupation: disbaled  ?Tobacco Use  ? Smoking status: Never  ? Smokeless tobacco: Never  ?Vaping Use  ? Vaping Use: Never used  ?Substance and Sexual Activity  ? Alcohol use: No  ?  Alcohol/week: 0.0 standard drinks  ? Drug use: No  ? Sexual activity: Not Currently  ?Other Topics Concern  ? Not on file  ?Social History Narrative  ? Pt currently living with her cousin in [place] who is also her Payee  ? ?Social Determinants of Health  ? ?Financial Resource Strain: Low Risk   ? Difficulty of Paying Living Expenses: Not hard at all  ?Food Insecurity: No Food Insecurity  ? Worried About Charity fundraiser in the Last Year: Never true  ? Ran Out of Food in the Last Year: Never true  ?Transportation Needs: No Transportation Needs  ? Lack of Transportation (Medical): No  ? Lack of Transportation (Non-Medical): No  ?Physical Activity: Inactive  ? Days of Exercise per Week: 0 days  ? Minutes of Exercise per Session: 0 min  ?Stress: No Stress Concern Present  ? Feeling of Stress : Only a little  ?Social Connections: Socially Isolated  ? Frequency of Communication with Friends and Family: More than three times a week  ? Frequency of Social Gatherings with Friends and Family: Once a week  ?  Attends Religious Services: Never  ? Active Member of Clubs or Organizations: No  ? Attends Archivist Meetings: Never  ? Marital Status: Never married  ? ? ?Tobacco Counseling ?Counseling given: Not Answered ? ? ?Clinical Intake: ? ?Pre-visit preparation completed: Yes ? ?Pain : 0-10 ?Pain Score: 5  ?Pain  Type: Chronic pain ?Pain Location: Knee ?Pain Orientation: Right ?Pain Descriptors / Indicators: Aching, Sore ?Pain Onset: More than a month ago ?Pain Frequency: Constant ? ?  ? ?BMI - recorded: 39.61 ?Nutritional Status: BMI > 30  Obese ?Nutritional Risks: None ?Diabetes: Yes ?CBG done?: No ?Did pt. bring in CBG monitor from home?: No ? ?How often do you need to have someone help you when you read instructions, pamphlets, or other written materials from your doctor or pharmacy?: 1 - Never ?Nutrition Risk Assessment: ? ?Has the patient had any N/V/D within the last 2 months?  No  ?Does the patient have any non-healing wounds?  No  ?Has the patient had any unintentional weight loss or weight gain?  No  ? ?Diabetes: ? ?Is the patient diabetic?  Yes  ?If diabetic, was a CBG obtained today?  No  ?Did the patient bring in their glucometer from home?  No  ?How often do you monitor your CBG's? Pt does not actively check blood sugar.  ? ?Financial Strains and Diabetes Management: ? ?Are you having any financial strains with the device, your supplies or your medication? No .  ?Does the patient want to be seen by Chronic Care Management for management of their diabetes?  No  ?Would the patient like to be referred to a Nutritionist or for Diabetic Management?  No  ? ?Diabetic Exams: ? ?Diabetic Eye Exam: Completed 02/18/21.  ? ?Diabetic Foot Exam: Completed 07/25/21.  ? ?Interpreter Needed?: No ? ?Information entered by :: Clemetine Marker LPN ? ? ?Activities of Daily Living ? ?  01/22/2022  ?  3:13 PM 01/22/2022  ?  2:55 PM  ?In your present state of health, do you have any difficulty performing the following activities:   ?Hearing? 1 1  ?Vision? 0 0  ?Difficulty concentrating or making decisions? 0 0  ?Walking or climbing stairs? 1 1  ?Dressing or bathing? 0 0  ?Doing errands, shopping? 1 1  ?Preparing Food and eating ? N

## 2022-01-22 NOTE — Assessment & Plan Note (Signed)
On Keppra, no symtpoms  ?

## 2022-01-22 NOTE — Patient Instructions (Addendum)
[name] , ?Thank you for taking time to come for your Medicare Wellness Visit. I appreciate your ongoing commitment to your health goals. Please review the following plan we discussed and let me know if I can assist you in the future.  ? ?Screening recommendations/referrals: ?Colonoscopy: done 12/09/13. Due for repeat screening. Referral sent to [facility] today for repeat screening colonoscopy. They will contact you for an appointment.  ?Mammogram: done 07/19/21 ?Bone Density: done 05/11/20 ?Recommended yearly ophthalmology/optometry visit for glaucoma screening and checkup ?Recommended yearly dental visit for hygiene and checkup ? ?Vaccinations: ?Influenza vaccine: done 07/25/21 ?Pneumococcal vaccine: done 06/09/19 ?Tdap vaccine: due ?Shingles vaccine: Shingrix discussed. Please contact your pharmacy for coverage information.  ?Covid-19:done 11/19/19, 12/22/19 & 07/19/20 ? ?Conditions/risks identified: recommend contiuning fall prevention at home ? ?Next appointment: Follow up in one year for your annual wellness visit  ? ? ?Preventive Care 31 Years and Older, Female ?Preventive care refers to lifestyle choices and visits with your health care provider that can promote health and wellness. ?What does preventive care include? ?A yearly physical exam. This is also called an annual well check. ?Dental exams once or twice a year. ?Routine eye exams. Ask your health care provider how often you should have your eyes checked. ?Personal lifestyle choices, including: ?Daily care of your teeth and gums. ?Regular physical activity. ?Eating a healthy diet. ?Avoiding tobacco and drug use. ?Limiting alcohol use. ?Practicing safe sex. ?Taking low-dose aspirin every day. ?Taking vitamin and mineral supplements as recommended by your health care provider. ?What happens during an annual well check? ?The services and screenings done by your health care provider during your annual well check will depend on your age,  overall health, lifestyle risk factors, and family history of disease. ?Counseling  ?Your health care provider may ask you questions about your: ?Alcohol use. ?Tobacco use. ?Drug use. ?Emotional well-being. ?Home and relationship well-being. ?Sexual activity. ?Eating habits. ?History of falls. ?Memory and ability to understand (cognition). ?Work and work Statistician. ?Reproductive health. ?Screening  ?You may have the following tests or measurements: ?Height, weight, and BMI. ?Blood pressure. ?Lipid and cholesterol levels. These may be checked every 5 years, or more frequently if you are over 39 years old. ?Skin check. ?Lung cancer screening. You may have this screening every year starting at age 40 if you have a 30-pack-year history of smoking and currently smoke or have quit within the past 15 years. ?Fecal occult blood test (FOBT) of the stool. You may have this test every year starting at age 10. ?Flexible sigmoidoscopy or colonoscopy. You may have a sigmoidoscopy every 5 years or a colonoscopy every 10 years starting at age 65. ?Hepatitis C blood test. ?Hepatitis B blood test. ?Sexually transmitted disease (STD) testing. ?Diabetes screening. This is done by checking your blood sugar (glucose) after you have not eaten for a while (fasting). You may have this done every 1-3 years. ?Bone density scan. This is done to screen for osteoporosis. You may have this done starting at age 32. ?Mammogram. This may be done every 1-2 years. Talk to your health care provider about how often you should have regular mammograms. ?Talk with your health care provider about your test results, treatment options, and if necessary, the need for more tests. ?Vaccines  ?Your health care provider may recommend certain vaccines, such as: ?Influenza vaccine. This is recommended every year. ?Tetanus, diphtheria, and acellular pertussis (Tdap, Td) vaccine. You may need a Td booster every 10 years. ?Zoster vaccine. You may need  this after age  55. ?Pneumococcal 13-valent conjugate (PCV13) vaccine. One dose is recommended after age 68. ?Pneumococcal polysaccharide (PPSV23) vaccine. One dose is recommended after age 65. ?Talk to your health care provider about which screenings and vaccines you need and how often you need them. ?This information is not intended to replace advice given to you by your health care provider. Make sure you discuss any questions you have with your health care provider. ?Document Released: 09/22/2015 Document Revised: 05/15/2016 Document Reviewed: 06/27/2015 ?Elsevier Interactive Patient Education ? 2017 [place]. ? ?Fall Prevention in the Home ?Falls can cause injuries. They can happen to people of all ages. There are many things you can do to make your home safe and to help prevent falls. ?What can I do on the outside of my home? ?Regularly fix the edges of walkways and driveways and fix any cracks. ?Remove anything that might make you trip as you walk through a door, such as a raised step or threshold. ?Trim any bushes or trees on the path to your home. ?Use bright outdoor lighting. ?Clear any walking paths of anything that might make someone trip, such as rocks or tools. ?Regularly check to see if handrails are loose or broken. Make sure that both sides of any steps have handrails. ?Any raised decks and porches should have guardrails on the edges. ?Have any leaves, snow, or ice cleared regularly. ?Use sand or salt on walking paths during winter. ?Clean up any spills in your garage right away. This includes oil or grease spills. ?What can I do in the bathroom? ?Use night lights. ?Install grab bars by the toilet and in the tub and shower. Do not use towel bars as grab bars. ?Use non-skid mats or decals in the tub or shower. ?If you need to sit down in the shower, use a plastic, non-slip stool. ?Keep the floor dry. Clean up any water that spills on the floor as soon as it happens. ?Remove soap buildup in the tub or shower  regularly. ?Attach bath mats securely with double-sided non-slip rug tape. ?Do not have throw rugs and other things on the floor that can make you trip. ?What can I do in the bedroom? ?Use night lights. ?Make sure that you have a light by your bed that is easy to reach. ?Do not use any sheets or blankets that are too big for your bed. They should not hang down onto the floor. ?Have a firm chair that has side arms. You can use this for support while you get dressed. ?Do not have throw rugs and other things on the floor that can make you trip. ?What can I do in the kitchen? ?Clean up any spills right away. ?Avoid walking on wet floors. ?Keep items that you use a lot in easy-to-reach places. ?If you need to reach something above you, use a strong step stool that has a grab bar. ?Keep electrical cords out of the way. ?Do not use floor polish or wax that makes floors slippery. If you must use wax, use non-skid floor wax. ?Do not have throw rugs and other things on the floor that can make you trip. ?What can I do with my stairs? ?Do not leave any items on the stairs. ?Make sure that there are handrails on both sides of the stairs and use them. Fix handrails that are broken or loose. Make sure that handrails are as long as the stairways. ?Check any carpeting to make sure that it is firmly attached to  the stairs. Fix any carpet that is loose or worn. ?Avoid having throw rugs at the top or bottom of the stairs. If you do have throw rugs, attach them to the floor with carpet tape. ?Make sure that you have a light switch at the top of the stairs and the bottom of the stairs. If you do not have them, ask someone to add them for you. ?What else can I do to help prevent falls? ?Wear shoes that: ?Do not have high heels. ?Have rubber bottoms. ?Are comfortable and fit you well. ?Are closed at the toe. Do not wear sandals. ?If you use a stepladder: ?Make sure that it is fully opened. Do not climb a closed stepladder. ?Make sure that  both sides of the stepladder are locked into place. ?Ask someone to hold it for you, if possible. ?Clearly mark and make sure that you can see: ?Any grab bars or handrails. ?First and last steps. ?Where the

## 2022-01-22 NOTE — Assessment & Plan Note (Signed)
bp is at goal  ?

## 2022-01-22 NOTE — Assessment & Plan Note (Signed)
Discussed life style modification ?

## 2022-01-22 NOTE — Assessment & Plan Note (Signed)
On statin therapy and no side effects ?

## 2022-01-22 NOTE — Assessment & Plan Note (Signed)
S/p partial resection, had repeat MRI done 22 and stable.  ?

## 2022-01-22 NOTE — Assessment & Plan Note (Signed)
Under the care of psychiatrist, since started to get haldol from social worker at her house she has not been admitted to psychiatric units ?Compliant with therapy - uses pill pack ?Lives with her cousin  ?

## 2022-01-28 ENCOUNTER — Other Ambulatory Visit: Payer: Self-pay

## 2022-01-28 DIAGNOSIS — Z86010 Personal history of colonic polyps: Secondary | ICD-10-CM

## 2022-01-28 MED ORDER — NA SULFATE-K SULFATE-MG SULF 17.5-3.13-1.6 GM/177ML PO SOLN: 1.0000 | Freq: Once | ORAL | 0 refills | Status: AC

## 2022-01-28 MED ORDER — ALBUMIN HUMAN 25 % IV SOLN: INTRAVENOUS | 11 refills | Status: DC

## 2022-01-28 NOTE — Progress Notes (Signed)
Gastroenterology Pre-Procedure Review  Request Date: 02/13/2022 Requesting Physician: Dr. Marius Ditch  PATIENT REVIEW QUESTIONS: The patient responded to the following health history questions as indicated:    1. Are you having any GI issues? no 2. Do you have a personal history of Polyps? yes (last colonoscopy) 3. Do you have a family history of Colon Cancer or Polyps? no 4. Diabetes Mellitus? yes (type 2) 5. Joint replacements in the past 12 months?no 6. Major health problems in the past 3 months?no 7. Any artificial heart valves, MVP, or defibrillator?no    MEDICATIONS & ALLERGIES:    Patient reports the following regarding taking any anticoagulation/antiplatelet therapy:   Plavix, Coumadin, Eliquis, Xarelto, Lovenox, Pradaxa, Brilinta, or Effient? no Aspirin? yes (81 mg)  Patient confirms/reports the following medications:  Current Outpatient Medications  Medication Sig Dispense Refill   albuterol (VENTOLIN HFA) 108 (90 Base) MCG/ACT inhaler Inhale 2 puffs into the lungs every 4 (four) hours as needed.     benztropine (COGENTIN) 1 MG tablet Take 1-2 tablets (1-2 mg total) by mouth 2 (two) times daily. One tablet ('1mg'$ ) in the morning and two tablets ('2mg'$ ) at bedtime (Patient taking differently: Take 1 mg by mouth 3 (three) times daily. One tablet ('1mg'$ ) in the morning and two tablets ('2mg'$ ) at bedtime) 120 tablet 2   desonide (DESOWEN) 0.05 % cream Apply topically 2 (two) times daily. 60 g 2   escitalopram (LEXAPRO) 10 MG tablet Take 1 tablet (10 mg total) by mouth daily. 90 tablet 1   gabapentin (NEURONTIN) 100 MG capsule Take 1 capsule (100 mg total) by mouth 2 (two) times daily. 180 capsule 1   haloperidol decanoate (HALDOL DECANOATE) 100 MG/ML injection Inject 100 mg into the muscle every 28 (twenty-eight) days.  (Patient not taking: Reported on 01/22/2022)     levETIRAcetam (KEPPRA) 500 MG tablet Take 1 tablet (500 mg total) by mouth 2 (two) times daily. 60 tablet 1   meloxicam (MOBIC)  7.5 MG tablet TAKE (1) TABLET BY MOUTH ONCE A DAY. 30 tablet 0   QC LO-DOSE ASPIRIN 81 MG EC tablet TAKE 1 TABLET BY MOUTHTONCE A DAY.     rosuvastatin (CRESTOR) 40 MG tablet Take 1 tablet (40 mg total) by mouth daily. 90 tablet 1   traZODone (DESYREL) 50 MG tablet Take 1 tablet (50 mg total) by mouth at bedtime. 30 tablet 3   valsartan (DIOVAN) 160 MG tablet Take 1 tablet (160 mg total) by mouth daily. 90 tablet 1   No current facility-administered medications for this visit.    Patient confirms/reports the following allergies:  Allergies  Allergen Reactions   Percocet [Oxycodone-Acetaminophen] Diarrhea, Nausea And Vomiting and Nausea Only   Tramadol Hcl Diarrhea, Nausea And Vomiting and Nausea Only   Vicodin [Hydrocodone-Acetaminophen] Diarrhea, Nausea And Vomiting and Nausea Only    No orders of the defined types were placed in this encounter.   AUTHORIZATION INFORMATION Primary Insurance: 1D#: Group #:  Secondary Insurance: 1D#: Group #:  SCHEDULE INFORMATION: Date: 02/13/2022 Time: Location:[facility]

## 2022-02-12 ENCOUNTER — Encounter: Payer: Self-pay | Admitting: Gastroenterology

## 2022-02-13 ENCOUNTER — Ambulatory Visit: Admission: RE | Admit: 2022-02-13 | Payer: Medicare Other | Source: Home / Self Care | Admitting: Gastroenterology

## 2022-02-13 ENCOUNTER — Encounter: Admission: RE | Payer: Self-pay | Source: Home / Self Care

## 2022-02-13 SURGERY — COLONOSCOPY WITH PROPOFOL
Anesthesia: General

## 2022-02-14 ENCOUNTER — Other Ambulatory Visit: Payer: Self-pay | Admitting: Orthopedic Surgery

## 2022-02-14 ENCOUNTER — Telehealth: Payer: Self-pay | Admitting: Family Medicine

## 2022-02-14 DIAGNOSIS — M1711 Unilateral primary osteoarthritis, right knee: Secondary | ICD-10-CM

## 2022-02-14 NOTE — Telephone Encounter (Signed)
Pt is calling because she is looking to go to [facility]. Pt reports that she make too much money to qualify for medicaid. Pt reports after she would pay for her care she would have no savings. Pt reports that [facility] reports that is Dr Ancil Boozer is able to complete FL-2 with the last 3 office visits. She could possibly get on medicaid.  Cb- [phone]

## 2022-02-18 ENCOUNTER — Other Ambulatory Visit: Payer: Self-pay | Admitting: Family Medicine

## 2022-02-19 ENCOUNTER — Other Ambulatory Visit: Payer: Self-pay

## 2022-02-19 NOTE — Telephone Encounter (Signed)
Left vm letting pt know we can try to do a FL2

## 2022-03-18 ENCOUNTER — Other Ambulatory Visit: Payer: Self-pay | Admitting: Orthopedic Surgery

## 2022-03-18 DIAGNOSIS — M1711 Unilateral primary osteoarthritis, right knee: Secondary | ICD-10-CM

## 2022-04-01 ENCOUNTER — Other Ambulatory Visit: Payer: Self-pay | Admitting: Family Medicine

## 2022-04-01 MED ORDER — GABAPENTIN 100 MG PO CAPS: 100.0000 mg | ORAL_CAPSULE | Freq: Two times a day (BID) | ORAL | 2 refills | Status: DC

## 2022-04-01 NOTE — Telephone Encounter (Signed)
Requested medication (s) are due for refill today: Yes  Requested medication (s) are on the active medication list: Yes  Last refill:  02/18/22  Future visit scheduled: Yes  Notes to clinic:  Noted in last refill of gabapetin last refilled by psychiatrist, that was in 2020, not sure if patient sees psychiatrist, routing to PCP for approval of refills.     Requested Prescriptions  Pending Prescriptions Disp Refills   gabapentin (NEURONTIN) 100 MG capsule 60 capsule 0    Sig: Take 1 capsule (100 mg total) by mouth 2 (two) times daily.     Neurology: Anticonvulsants - gabapentin Passed - 04/01/2022  3:44 PM      Passed - Cr in normal range and within 360 days    Creat  Date Value Ref Range Status  07/25/2021 0.66 0.50 - 1.05 mg/dL Final   Creatinine, POC  Date Value Ref Range Status  05/30/2016 Negative mg/dL Final   Creatinine, Urine  Date Value Ref Range Status  07/25/2021 64 20 - 275 mg/dL Final         Passed - Completed PHQ-2 or PHQ-9 in the last 360 days      Passed - Valid encounter within last 12 months    Recent Outpatient Visits           2 months ago Dyslipidemia associated with type 2 diabetes mellitus [facility])   [facility] Steele Sizer, MD   8 months ago Dyslipidemia associated with type 2 diabetes mellitus [facility])   [facility] Steele Sizer, MD   1 year ago Meningioma [facility])   [facility] Steele Sizer, MD   1 year ago Type 2 diabetes mellitus with obesity [facility])   [facility] Steele Sizer, MD   1 year ago Right shoulder pain, unspecified chronicity   [facility] Delsa Grana, PA-C       Future Appointments             In 3 months Steele Sizer, [facility]             escitalopram (LEXAPRO) 10 MG tablet 30 tablet 0    Sig: Take 1 tablet (10 mg total) by mouth daily.     Psychiatry:  Antidepressants - SSRI  Passed - 04/01/2022  3:44 PM      Passed - Valid encounter within last 6 months    Recent Outpatient Visits           2 months ago Dyslipidemia associated with type 2 diabetes mellitus [facility])   [facility] Steele Sizer, MD   8 months ago Dyslipidemia associated with type 2 diabetes mellitus [facility])   [facility] Steele Sizer, MD   1 year ago Meningioma [facility])   [facility] Steele Sizer, MD   1 year ago Type 2 diabetes mellitus with obesity [facility])   [facility] Steele Sizer, MD   1 year ago Right shoulder pain, unspecified chronicity   [facility] Delsa Grana, PA-C       Future Appointments             In 3 months Steele Sizer, MD [facility], [facility]

## 2022-04-01 NOTE — Telephone Encounter (Signed)
Pt called in to follow up on refill request for 3 medications. Pt says that she was told by her pharmacy that the request has been denied. (Not showing a new request)   gabapentin (NEURONTIN) 100 MG capsule escitalopram (LEXAPRO) 10 MG tablet   Future appt: November 17  Pharmacy:  [place], [place] Phone:  [phone]  Fax:  [phone]

## 2022-04-17 ENCOUNTER — Other Ambulatory Visit: Payer: Self-pay | Admitting: Orthopedic Surgery

## 2022-04-17 DIAGNOSIS — M1711 Unilateral primary osteoarthritis, right knee: Secondary | ICD-10-CM

## 2022-05-17 ENCOUNTER — Other Ambulatory Visit: Payer: Self-pay | Admitting: Orthopedic Surgery

## 2022-05-17 DIAGNOSIS — M1711 Unilateral primary osteoarthritis, right knee: Secondary | ICD-10-CM

## 2022-05-31 ENCOUNTER — Other Ambulatory Visit: Payer: Self-pay

## 2022-05-31 ENCOUNTER — Telehealth: Payer: Self-pay | Admitting: Family Medicine

## 2022-05-31 DIAGNOSIS — Z1231 Encounter for screening mammogram for malignant neoplasm of breast: Secondary | ICD-10-CM

## 2022-05-31 NOTE — Telephone Encounter (Signed)
Referral Request - Has patient seen PCP for this complaint? No.  Referral for which specialty: mammogram and psychiatrist   Preferred provider/office: [facility]  Reason for referral: pt states she needs physiatrist referral because of "the shakes in her hand"

## 2022-05-31 NOTE — Telephone Encounter (Signed)
Tried calling patient and no answer or vm set up

## 2022-06-06 ENCOUNTER — Encounter (HOSPITAL_COMMUNITY): Payer: Self-pay

## 2022-06-06 ENCOUNTER — Ambulatory Visit: Payer: Self-pay | Admitting: *Deleted

## 2022-06-06 ENCOUNTER — Other Ambulatory Visit: Payer: Self-pay

## 2022-06-06 ENCOUNTER — Emergency Department (HOSPITAL_COMMUNITY)
Admission: EM | Admit: 2022-06-06 | Discharge: 2022-06-07 | Disposition: A | Discharge status: Home / Self Care | Payer: Medicare Other | Attending: Emergency Medicine | Admitting: Emergency Medicine

## 2022-06-06 ENCOUNTER — Emergency Department (HOSPITAL_COMMUNITY): Payer: Medicare Other

## 2022-06-06 DIAGNOSIS — Z79899 Other long term (current) drug therapy: Secondary | ICD-10-CM | POA: Insufficient documentation

## 2022-06-06 DIAGNOSIS — Z7982 Long term (current) use of aspirin: Secondary | ICD-10-CM | POA: Insufficient documentation

## 2022-06-06 DIAGNOSIS — R079 Chest pain, unspecified: Secondary | ICD-10-CM | POA: Diagnosis present

## 2022-06-06 DIAGNOSIS — E119 Type 2 diabetes mellitus without complications: Secondary | ICD-10-CM | POA: Insufficient documentation

## 2022-06-06 DIAGNOSIS — R600 Localized edema: Secondary | ICD-10-CM | POA: Insufficient documentation

## 2022-06-06 DIAGNOSIS — I10 Essential (primary) hypertension: Secondary | ICD-10-CM | POA: Diagnosis not present

## 2022-06-06 LAB — CBC WITH DIFFERENTIAL/PLATELET
Abs Immature Granulocytes: 0.01 10*3/uL (ref 0.00–0.07)
Basophils Absolute: 0.1 10*3/uL (ref 0.0–0.1)
Basophils Relative: 1 %
Eosinophils Absolute: 0.1 10*3/uL (ref 0.0–0.5)
Eosinophils Relative: 1 %
HCT: 45 % (ref 36.0–46.0)
Hemoglobin: 15.2 g/dL — ABNORMAL HIGH (ref 12.0–15.0)
Immature Granulocytes: 0 %
Lymphocytes Relative: 28 %
Lymphs Abs: 1.5 10*3/uL (ref 0.7–4.0)
MCH: 32.1 pg (ref 26.0–34.0)
MCHC: 33.8 g/dL (ref 30.0–36.0)
MCV: 94.9 fL (ref 80.0–100.0)
Monocytes Absolute: 0.4 10*3/uL (ref 0.1–1.0)
Monocytes Relative: 7 %
Neutro Abs: 3.2 10*3/uL (ref 1.7–7.7)
Neutrophils Relative %: 63 %
Platelets: 159 10*3/uL (ref 150–400)
RBC: 4.74 MIL/uL (ref 3.87–5.11)
RDW: 13.8 % (ref 11.5–15.5)
WBC: 5.2 10*3/uL (ref 4.0–10.5)
nRBC: 0 % (ref 0.0–0.2)

## 2022-06-06 LAB — URINALYSIS, ROUTINE W REFLEX MICROSCOPIC
Bilirubin Urine: NEGATIVE
Glucose, UA: NEGATIVE mg/dL
Hgb urine dipstick: NEGATIVE
Ketones, ur: 5 mg/dL — AB
Leukocytes,Ua: NEGATIVE
Nitrite: NEGATIVE
Protein, ur: 100 mg/dL — AB
Specific Gravity, Urine: 1.02 (ref 1.005–1.030)
pH: 6 (ref 5.0–8.0)

## 2022-06-06 LAB — BRAIN NATRIURETIC PEPTIDE: B Natriuretic Peptide: 14 pg/mL (ref 0.0–100.0)

## 2022-06-06 LAB — BASIC METABOLIC PANEL
Anion gap: 8 (ref 5–15)
BUN: 10 mg/dL (ref 8–23)
CO2: 26 mmol/L (ref 22–32)
Calcium: 9.2 mg/dL (ref 8.9–10.3)
Chloride: 105 mmol/L (ref 98–111)
Creatinine, Ser: 0.69 mg/dL (ref 0.44–1.00)
GFR, Estimated: >60 mL/min (ref >60)
Glucose, Bld: 166 mg/dL — ABNORMAL HIGH (ref 70–99)
Potassium: 3.5 mmol/L (ref 3.5–5.1)
Sodium: 139 mmol/L (ref 135–145)

## 2022-06-06 LAB — TROPONIN I (HIGH SENSITIVITY)
Troponin I (High Sensitivity): 7 ng/L (ref <18)
Troponin I (High Sensitivity): 7 ng/L (ref <18)

## 2022-06-06 MED ORDER — LABETALOL HCL 5 MG/ML IV SOLN
10.0000 mg | Freq: Once | INTRAVENOUS | Status: AC
  Administered 2022-06-06: 10 mg via INTRAVENOUS
  Filled 2022-06-06: qty 4

## 2022-06-06 MED ORDER — HYDRALAZINE HCL 20 MG/ML IJ SOLN
10.0000 mg | Freq: Once | INTRAMUSCULAR | Status: AC
  Administered 2022-06-06: 10 mg via INTRAVENOUS
  Filled 2022-06-06: qty 1

## 2022-06-06 NOTE — Telephone Encounter (Signed)
[facility] with pt. Want to report BP down to 174/129, will transport pt. To ED.

## 2022-06-06 NOTE — Discharge Instructions (Signed)
You were seen in the emergency department today for high blood pressure. Please call your primary care provider tomorrow to make a sooner appointment to have your blood pressure medication changed to help. Please return for high blood pressure with confusion, chest pain or shortness of breath, abdominal pain.

## 2022-06-06 NOTE — ED Notes (Signed)
[facility] notified of pt's bp 182/100

## 2022-06-06 NOTE — ED Provider Notes (Signed)
[facility] EMERGENCY DEPARTMENT Provider Note   CSN: [id number] Arrival date & time: 06/06/22  1423     History  Chief Complaint  Patient presents with   Hypertension    Lauren [name] is a 70 y.o. female.  With past medical history of sleep apnea, diabetes, GERD, hyperlipidemia, hypertension, stroke who presents to the emergency department with hypertension.  Patient states that for the past 3 days she has had elevated blood pressure.  She states that she was told by her primary care provider to come to the emergency department.  She states that she began having chest pain about 2 nights ago.  She describes it as intermittent pain that is left-sided.  Some difficulty in describing this chest pain but states that it "feels like I am having shortness of breath."  She also states that she has had a sharp left-sided chest pain under her left breast that was brief.  She denies currently having chest pain but states that she still does feel short of breath since presenting to the emergency department.  She denies any headache or visual changes.  She denies palpitations, cough or fever.  Denies any abdominal pain or noted hematuria.  She is unsure if she has missed any doses of her medicine.   Hypertension Associated symptoms include chest pain and shortness of breath. Pertinent negatives include no abdominal pain and no headaches.       Home Medications Prior to Admission medications   Medication Sig Start Date End Date Taking? Authorizing Provider  albuterol (VENTOLIN HFA) 108 (90 Base) MCG/ACT inhaler Inhale 2 puffs into the lungs every 4 (four) hours as needed. 08/23/21  Yes [provider]  gabapentin (NEURONTIN) 100 MG capsule Take 1 capsule (100 mg total) by mouth 2 (two) times daily. 04/01/22  Yes Sowles, Drue Stager, MD  haloperidol decanoate (HALDOL DECANOATE) 100 MG/ML injection Inject 100 mg into the muscle every 28 (twenty-eight) days. 01/13/19  Yes [provider]  meloxicam (MOBIC) 7.5 MG tablet TAKE (1) TABLET BY MOUTH ONCE A DAY. Patient taking differently: Take 7.5 mg by mouth daily. 05/20/22  Yes Carole Civil, MD  QC LO-DOSE ASPIRIN 81 MG EC tablet Take 81 mg by mouth daily. 12/18/21  Yes [provider]  rosuvastatin (CRESTOR) 40 MG tablet Take 1 tablet (40 mg total) by mouth daily. 01/22/22  Yes Sowles, Drue Stager, MD  traZODone (DESYREL) 50 MG tablet TAKE (1) TABLET BY MOUTH AT BEDTIME. Patient taking differently: Take 50 mg by mouth at bedtime. 02/18/22  Yes Teodora Medici, DO  valsartan (DIOVAN) 160 MG tablet Take 1 tablet (160 mg total) by mouth daily. 01/22/22  Yes Sowles, Drue Stager, MD  desonide (DESOWEN) 0.05 % cream Apply topically 2 (two) times daily. Patient not taking: Reported on 06/06/2022 01/22/22   Steele Sizer, MD      Allergies    Percocet [oxycodone-acetaminophen], Tramadol hcl, and Vicodin [hydrocodone-acetaminophen]    Review of Systems   Review of Systems  Constitutional:  Negative for fever.  Eyes:  Negative for visual disturbance.  Respiratory:  Positive for shortness of breath.   Cardiovascular:  Positive for chest pain. Negative for palpitations.  Gastrointestinal:  Negative for abdominal pain, nausea and vomiting.  Genitourinary:  Negative for hematuria.  Neurological:  Negative for syncope and headaches.  All other systems reviewed and are negative.   Physical Exam Updated Vital Signs BP (!) 165/78   Pulse 84   Temp 97.9 F (36.6 C) (Oral)   Resp [name])  23   Ht '5\' 4"'$  (1.626 m)   Wt 99.8 kg   SpO2 98%   BMI 37.76 kg/m  Physical Exam Vitals and nursing note reviewed.  Constitutional:      General: She is not in acute distress.    Appearance: Normal appearance. She is obese. She is not ill-appearing or toxic-appearing.  HENT:     Head: Normocephalic.     Mouth/Throat:     Mouth: Mucous membranes are moist.     Pharynx: Oropharynx is clear.  Eyes:     General: No scleral  icterus.    Extraocular Movements: Extraocular movements intact.     Pupils: Pupils are equal, round, and reactive to light.  Cardiovascular:     Rate and Rhythm: Normal rate and regular rhythm.     Pulses:          Radial pulses are 2+ on the right side and 2+ on the left side.       Posterior tibial pulses are 1+ on the right side and 1+ on the left side.     Heart sounds: No murmur heard.    Comments: Trace lower extremity pitting edema Pulmonary:     Effort: Pulmonary effort is normal. No respiratory distress.     Breath sounds: Normal breath sounds.  Abdominal:     General: Bowel sounds are normal. There is no distension.     Palpations: Abdomen is soft.     Tenderness: There is no abdominal tenderness.  Musculoskeletal:        General: Normal range of motion.     Cervical back: Neck supple.     Right lower leg: Edema present.     Left lower leg: Edema present.  Skin:    General: Skin is warm and dry.     Capillary Refill: Capillary refill takes less than 2 seconds.  Neurological:     General: No focal deficit present.     Mental Status: She is alert and oriented to person, place, and time. Mental status is at baseline.  Psychiatric:        Mood and Affect: Mood normal.        Behavior: Behavior normal.        Thought Content: Thought content normal.        Judgment: Judgment normal.    ED Results / Procedures / Treatments   Labs (all labs ordered are listed, but only abnormal results are displayed) Labs Reviewed  BASIC METABOLIC PANEL - Abnormal; Notable for the following components:      Result Value   Glucose, Bld 166 (*)    All other components within normal limits  CBC WITH DIFFERENTIAL/PLATELET - Abnormal; Notable for the following components:   Hemoglobin 15.2 (*)    All other components within normal limits  URINALYSIS, ROUTINE W REFLEX MICROSCOPIC - Abnormal; Notable for the following components:   Ketones, ur 5 (*)    Protein, ur 100 (*)    Bacteria, UA  RARE (*)    All other components within normal limits  BRAIN NATRIURETIC PEPTIDE  TROPONIN I (HIGH SENSITIVITY)  TROPONIN I (HIGH SENSITIVITY)   EKG EKG Interpretation  Date/Time:  Thursday June 06 2022 20:32:11 EDT Ventricular Rate:  82 PR Interval:  142 QRS Duration: 73 QT Interval:  407 QTC Calculation: 476 R Axis:   -19 Text Interpretation: Sinus rhythm Left ventricular hypertrophy Confirmed by Dorie Rank [phone]) on 06/08/2022 8:53:14 AM  Radiology No results found.  Procedures Procedures  Medications Ordered in ED Medications  hydrALAZINE (APRESOLINE) injection 10 mg (10 mg Intravenous Given 06/06/22 2156)  labetalol (NORMODYNE) injection 10 mg (10 mg Intravenous Given 06/06/22 2248)  hydrALAZINE (APRESOLINE) injection 10 mg (10 mg Intravenous Given 06/07/22 0014)  labetalol (NORMODYNE) injection 10 mg (10 mg Intravenous Given 06/07/22 0111)    ED Course/ Medical Decision Making/ A&P                           Medical Decision Making Amount and/or Complexity of Data Reviewed Labs: ordered. Radiology: ordered.  Risk Prescription drug management.  This patient presents to the ED with chief complaint(s) of hypertension: With pertinent past medical history of hypertension, diabetes which further complicates the presenting complaint. The complaint involves an extensive differential diagnosis and also carries with it a high risk of complications and morbidity.    The differential diagnosis includes hypertension, hypertensive urgency, hypertensive emergency, press syndrome  Additional history obtained: Additional history obtained from  none available Records reviewed Care Everywhere/External Records and Primary Care Documents  ED Course and Reassessment: 15 old female who presents to the emergency department with symptomatic hypertension.  She is significantly hypertensive here in the ED as high as 094 systolic over 709 systolic.  Based on her history it sounds as  if she is intermittently compliant on her hypertensive medications. Labs ordered including urine, chest x-ray, troponins and BNP. Given a dose of hydralazine which did not seem to improve her symptoms.  Given a dose of labetalol which much improved her blood pressure systolically.  Labs without evidence of endorgan damage.  Troponin x2 negative, BNP negative, chest x-ray without any acute findings, urine with protein which is intermittently in her urine more chronically.  She was feeling short of breath and her chest x-ray does not show any acute findings, do not feel that she needs CTA PE study at this time as the symptoms are not consistent with this.  No headache, blurred vision or dizziness concerning for press syndrome or need for CTA of her head.  As her blood pressure improved she states that she is no longer symptomatic.  Discussed adherence to antihypertensives and she verbalized understanding.  Otherwise feel that she is safe for discharge at this time with PCP follow-up.  Will not adjust her medications at this time as she is intermittently compliant on them and unsure if they are actually appropriate dosage.  Independent labs interpretation:  The following labs were independently interpreted: Troponin is negative, BNP negative  Independent visualization of imaging: - I independently visualized the following imaging with scope of interpretation limited to determining acute life threatening conditions related to emergency care: Chest x-ray, which revealed no acute findings  Consultation: - Consulted or discussed management/test interpretation w/ external professional: Not indicated  Consideration for admission or further workup: Not indicated Social Determinants of health: Low health literacy Final Clinical Impression(s) / ED Diagnoses Final diagnoses:  Primary hypertension    Rx / DC Orders ED Discharge Orders     None         Mickie Hillier, PA-C 06/12/22 [place], Ankit, MD 06/12/22 1558

## 2022-06-06 NOTE — ED Notes (Signed)
Pharmacy at bedside

## 2022-06-06 NOTE — Telephone Encounter (Signed)
  Chief Complaint: BP 216/120  Psych. Nurse there to give her a Haldol shot [place] Symptoms: Elevated BP so instructed to call 911 which pt was agreeable to doing and Almyra Free there with her said they will call. Frequency: Now Pertinent Negatives: Patient denies N/A Disposition: '[x]'$ ED /'[]'$ Urgent Care (no appt availability in office) / '[]'$ Appointment(In office/virtual)/ '[]'$  [place] [facility]/ '[]'$ Home Care/ '[]'$ Refused Recommended Disposition /'[]'$ [place] [facility]/ '[]'$  Follow-up with PCP Additional Notes: Referred this pt to the ED per the protocol.   Going to [facility] via ambulance.   Calling 911.

## 2022-06-06 NOTE — ED Provider Triage Note (Signed)
Emergency Medicine Provider Triage Evaluation Note  Nyliah Nierenberg , a 71 y.o. female  was evaluated in triage.  Pt complains of hypertension. States she has had elevated BP x3 days and was told by her PCP to present to ED. Endorses shortness of breath and chest pain. She denies headache or visual changes. Denies palpitations, cough or fever. Denies abdominal pain or hematuria.   Review of Systems  Positive: See above Negative:   Physical Exam  BP (!) 185/98 (BP Location: Right Arm)   Pulse 97   Temp 98.3 F (36.8 C) (Oral)   Resp 18   Ht '5\' 4"'$  (1.626 m)   Wt 99.8 kg   SpO2 93% Comment: Simultaneous filing. User may not have seen previous data.  BMI 37.76 kg/m  Gen:   Awake, no distress   Resp:  Normal effort  MSK:   Moves extremities without difficulty  Other:  Lungs CTAB  Medical Decision Making  Medically screening exam initiated at 4:01 PM.  Appropriate orders placed.  Estefany Goebel was informed that the remainder of the evaluation will be completed by another provider, this initial triage assessment does not replace that evaluation, and the importance of remaining in the ED until their evaluation is complete.     Mickie Hillier, PA-C 06/06/22 [phone]

## 2022-06-06 NOTE — Telephone Encounter (Signed)
Reason for Disposition  [4] Systolic BP  >= 097 OR Diastolic >= 353 AND [2] having NO cardiac or neurologic symptoms    Calling 911 to go to the ED at [facility]  Answer Assessment - Initial Assessment Questions 1. BLOOD PRESSURE: "What is the blood pressure?" "Did you take at least two measurements 5 minutes apart?"     [name] calling in.   She is visiting to give a Haldol shot.      216/120 is her BP.   I was calling about her medications.   It looks like she is on valsartan but this B is high. I instructed her to go to the ED with BP that high and her family history of stroke, hypertension and diabetes.   Almyra Free was agreeable that the pt needed t go to the ED.   Pt willing to call 911 and will do that now. 2. ONSET: "When did you take your blood pressure?"     Few minutes ago 3. HOW: "How did you take your blood pressure?" (e.g., automatic home BP monitor, visiting nurse)     Visiting nurse Psych. Nurse there to give pt her Haldol shot. 4. HISTORY: "Do you have a history of high blood pressure?"     Yes 5. MEDICINES: "Are you taking any medicines for blood pressure?" "Have you missed any doses recently?"     Valsartan is only thing Almyra Free said she sees on the chart. 6. OTHER SYMPTOMS: "Do you have any symptoms?" (e.g., blurred vision, chest pain, difficulty breathing, headache, weakness)     Not asked because told her to go ahead and call 911 to take her to ED.   Almyra Free said she lives in [place] so will go to [facility]. 7. PREGNANCY: "Is there any chance you are pregnant?" "When was your last menstrual period?"     N/A  Protocols used: Blood Pressure - High-A-AH

## 2022-06-06 NOTE — ED Triage Notes (Signed)
Pt presents to ED via [facility], states she is coming in for her high BP. Pt denies HA, blurred VA, dizziness.

## 2022-06-07 ENCOUNTER — Ambulatory Visit: Payer: Self-pay | Admitting: *Deleted

## 2022-06-07 MED ORDER — LABETALOL HCL 5 MG/ML IV SOLN
10.0000 mg | Freq: Once | INTRAVENOUS | Status: AC
  Administered 2022-06-07: 10 mg via INTRAVENOUS
  Filled 2022-06-07: qty 4

## 2022-06-07 MED ORDER — HYDRALAZINE HCL 20 MG/ML IJ SOLN
10.0000 mg | Freq: Once | INTRAMUSCULAR | Status: AC
  Administered 2022-06-07: 10 mg via INTRAVENOUS
  Filled 2022-06-07: qty 1

## 2022-06-07 NOTE — Telephone Encounter (Signed)
  Chief Complaint: anxiety Symptoms: shaky Frequency: always Pertinent Negatives: Patient denies attack Disposition: '[]'$ ED /'[]'$ Urgent Care (no appt availability in office) / '[]'$ Appointment(In office/virtual)/ '[]'$  [place] [facility]/ '[]'$ Home Care/ '[]'$ Refused Recommended Disposition /'[]'$ [place] [facility]/ '[x]'$  Follow-up with PCP Additional Notes: Pt went to ED yesterday and was told to follow up with her MD. She does not want to wait two weeks, she wants to be seen in one week. Appt made for 07/14/22, one week from ED visit. Home care dicussed.   Reason for Disposition  [1] Symptoms of anxiety or panic attack AND [2] is a chronic symptom (recurrent or ongoing AND present > 4 weeks)  Answer Assessment - Initial Assessment Questions 1. CONCERN: "Did anything happen that prompted you to call today?"      Called to schedule appt 2. ANXIETY SYMPTOMS: "Can you describe how you (your loved one; patient) have been feeling?" (e.g., tense, restless, panicky, anxious, keyed up, overwhelmed, sense of impending doom).      Lives with family, they are not there all the time 3. ONSET: "How long have you been feeling this way?" (e.g., hours, days, weeks)     Just starting anxiety 4. SEVERITY: "How would you rate the level of anxiety?" (e.g., 0 - 10; or mild, moderate, severe).     5 5. FUNCTIONAL IMPAIRMENT: "How have these feelings affected your ability to do daily activities?" "Have you had more difficulty than usual doing your normal daily activities?" (e.g., getting better, same, worse; self-care, school, work, interactions)     no 6. HISTORY: "Have you felt this way before?" "Have you ever been diagnosed with an anxiety problem in the past?" (e.g., generalized anxiety disorder, panic attacks, PTSD). If Yes, ask: "How was this problem treated?" (e.g., medicines, counseling, etc.)     Has not had CP or SOB 7. RISK OF HARM - SUICIDAL IDEATION: "Do you ever have thoughts of hurting or killing yourself?" If  Yes, ask:  "Do you have these feelings now?" "Do you have a plan on how you would do this?"     no 8. TREATMENT:  "What has been done so far to treat this anxiety?" (e.g., medicines, relaxation strategies). "What has helped?"     Went to ED 9. TREATMENT - THERAPIST: "Do you have a counselor or therapist? Name?"     Olen Pel PA 10. POTENTIAL TRIGGERS: "Do you drink caffeinated beverages (e.g., coffee, colas, teas), and how much daily?" "Do you drink alcohol or use any drugs?" "Have you started any new medicines recently?"       No caffeine or alcohol 11. PATIENT SUPPORT: "Who is with you now?" "Who do you live with?" "Do you have family or friends who you can talk to?"        family 66. OTHER SYMPTOMS: "Do you have any other symptoms?" (e.g., feeling depressed, trouble concentrating, trouble sleeping, trouble breathing, palpitations or fast heartbeat, chest pain, sweating, nausea, or diarrhea)       no 13. PREGNANCY: "Is there any chance you are pregnant?" "When was your last menstrual period?"       no  Protocols used: Anxiety and Panic Attack-A-AH

## 2022-06-07 NOTE — ED Notes (Signed)
Pt's walker is unable to be found at this time. Pt states that walker was brought with her on the EMS truck.

## 2022-06-13 ENCOUNTER — Ambulatory Visit: Payer: Medicare Other | Admitting: Family Medicine

## 2022-06-18 ENCOUNTER — Other Ambulatory Visit: Payer: Self-pay | Admitting: Orthopedic Surgery

## 2022-06-18 ENCOUNTER — Other Ambulatory Visit: Payer: Self-pay | Admitting: Radiology

## 2022-06-18 DIAGNOSIS — M1711 Unilateral primary osteoarthritis, right knee: Secondary | ICD-10-CM

## 2022-06-18 MED ORDER — MELOXICAM 7.5 MG PO TABS: 7.5000 mg | ORAL_TABLET | Freq: Every day | ORAL | 5 refills | Status: DC

## 2022-06-19 NOTE — Progress Notes (Signed)
Name: Lauren [name]   MRN: [id number]    DOB: 08/02/1951   Date:06/20/2022       Progress Note  Chief Complaint  Patient presents with   Hospitalization Follow-up    Elevated BP, pt states has had 2 episodes of palpitations after the hospital visit     Subjective:   Lauren [name] is a 71 y.o. female, presents to clinic for   Here for hospital/ER f/up   In ED 06/06/2022 for accelerated HTN and CP  She is significantly hypertensive here in the ED as high as 527 systolic over 782 systolic.  Based on her history it sounds as if she is intermittently compliant on her hypertensive medications. Labs ordered including urine, chest x-ray, troponins and BNP. Given a dose of hydralazine which did not seem to improve her symptoms.  Given a dose of labetalol which much improved her blood pressure systolically.   Labs without evidence of endorgan damage.  Troponin x2 negative, BNP negative, chest x-ray without any acute findings, urine with protein which is intermittently in her urine more chronically.  She was feeling short of breath and her chest x-ray does not show any acute findings, do not feel that she needs CTA PE study at this time as the symptoms are not consistent with this.  No headache, blurred vision or dizziness concerning for press syndrome or need for CTA of her head.  As her blood pressure improved she states that she is no longer symptomatic.  Discussed adherence to antihypertensives and she verbalized understanding.  Otherwise feel that she is safe for discharge at this time with PCP follow-up.  Will not adjust her medications at this time as she is intermittently compliant on them and unsure if they are actually appropriate dosage.   Independent labs interpretation:  The following labs were independently interpreted: Troponin is negative, BNP negative   Independent visualization of imaging: - I independently visualized the following imaging with scope of  interpretation limited to determining acute life threatening conditions related to emergency care: Chest x-ray, which revealed no acute findings   Consultation: - Consulted or discussed management/test interpretation w/ external professional: Not indicated   Consideration for admission or further workup: Not indicated Social Determinants of health: Low health literacy Final Clinical Impression(s) / ED Diagnoses Final diagnoses:  Primary hypertension    Here for f/up on HTN and new meds-  She was previously managed on valsartan 160 mg once daily BP Readings from Last 3 Encounters:  06/20/22 (!) 160/90  06/07/22 (!) 165/78  01/22/22 126/86   Pulse Readings from Last 3 Encounters:  06/20/22 97  06/07/22 84  01/22/22 95   Two episodes of palpitations since ED, none right now, she denies CP, SOB, DOE, orthopnea, she does not recall anything that happened before each of the episodes other than she felt anxious - no associate sx with palpitations, lasted only a few minutes She is not sure if the palpitations are due to anxiety she has been having - esp  related to tremor with her psychiatric meds  Reviewed past 2 ECG's ER and then in 2021, prior Cardiology consult in 2020 after stroke, but not following with them. I see no holter done previously       Current Outpatient Medications:    desonide (DESOWEN) 0.05 % cream, Apply topically 2 (two) times daily., Disp: 60 g, Rfl: 2   gabapentin (NEURONTIN) 100 MG capsule, Take 1 capsule (100 mg total) by mouth 2 (two) times  daily., Disp: 60 capsule, Rfl: 2   haloperidol decanoate (HALDOL DECANOATE) 100 MG/ML injection, Inject 100 mg into the muscle every 28 (twenty-eight) days., Disp: , Rfl:    meloxicam (MOBIC) 7.5 MG tablet, Take 1 tablet (7.5 mg total) by mouth daily., Disp: 60 tablet, Rfl: 5   QC LO-DOSE ASPIRIN 81 MG EC tablet, Take 81 mg by mouth daily., Disp: , Rfl:    QUEtiapine Fumarate (SEROQUEL XR) 150 MG 24 hr tablet, Take 150 mg  by mouth at bedtime., Disp: , Rfl:    rosuvastatin (CRESTOR) 40 MG tablet, Take 1 tablet (40 mg total) by mouth daily., Disp: 90 tablet, Rfl: 1   traZODone (DESYREL) 50 MG tablet, TAKE (1) TABLET BY MOUTH AT BEDTIME. (Patient taking differently: Take 50 mg by mouth at bedtime.), Disp: 30 tablet, Rfl: 0   valsartan (DIOVAN) 160 MG tablet, Take 1 tablet (160 mg total) by mouth daily., Disp: 90 tablet, Rfl: 1   albuterol (VENTOLIN HFA) 108 (90 Base) MCG/ACT inhaler, Inhale 2 puffs into the lungs every 4 (four) hours as needed. 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   SS-A antibody positive 04/15/2019   Seizures ([place]) 11/24/2018   Paresthesias 11/22/2018   Mouth dryness 10/12/2018   Hx of resection of meningioma 07/21/2018   Tremor 07/21/2018   Morbid obesity ([place]) 12/09/2017   Meningioma ([place]) 11/25/2017   Atherosclerosis of aorta ([place]) 11/25/2017   Calcification of coronary artery 11/25/2017   Schizophrenia ([place]) 11/04/2017   Acid reflux 06/29/2015   Diabetes mellitus type 2, controlled, without complications ([place]) 69/48/5462   Cardiac murmur 06/29/2015   Arthritis of knee, degenerative 06/28/2015   Insomnia w/ sleep apnea 03/21/2015   Anxiety disorder 03/21/2015   Essential hypertension 03/21/2015   HLD (hyperlipidemia) 03/21/2015   Urge incontinence 03/21/2015    Past Surgical History:  Procedure Laterality Date   BRAIN TUMOR EXCISION  2012   benign   CARDIAC CATHETERIZATION  2008   CESAREAN SECTION     CYST REMOVAL HAND     LUNG LOBECTOMY  1977   benign tumor   TOTAL KNEE ARTHROPLASTY Left 01/28/2017   Procedure: TOTAL KNEE ARTHROPLASTY;  Surgeon: Corky Mull, MD;  Location: [facility] ORS;  Service: Orthopedics;  Laterality: Left;    Family History  Problem Relation Age of Onset   Heart disease Brother    Depression Mother    Heart attack  Mother    Stroke Mother    Alcohol abuse Father    Stroke Father    Diabetes Sister    Diabetes Sister    Stomach cancer Sister    Kidney disease Sister    COPD Brother    Lung cancer Brother    Diabetes Brother     Social History   Tobacco Use   Smoking status: Never   Smokeless tobacco: Never  Vaping Use   Vaping Use: Never used  Substance Use Topics   Alcohol use: No    Alcohol/week: 0.0 standard drinks of alcohol   Drug use: No     Allergies  Allergen Reactions   Percocet [Oxycodone-Acetaminophen] Diarrhea, Nausea And Vomiting and Nausea Only   Tramadol Hcl Diarrhea, Nausea And Vomiting and Nausea Only   Vicodin [Hydrocodone-Acetaminophen] Diarrhea, Nausea And Vomiting and Nausea Only    Health Maintenance  Topic Date Due   COLONOSCOPY (Pts 45-66yr  Insurance coverage will need to be confirmed)  12/10/2018   OPHTHALMOLOGY EXAM  02/18/2022   COVID-19 Vaccine (4 - Moderna risk series) 07/06/2022 (Originally 09/13/2020)   Zoster Vaccines- Shingrix (1 of 2) 09/20/2022 (Originally [birth year]09/1972)   INFLUENZA VACCINE  12/08/2022 (Originally 04/09/2022)   TETANUS/TDAP  01/23/2023 (Originally 08/20/2020)   MAMMOGRAM  07/19/2022   Diabetic kidney evaluation - Urine ACR  07/25/2022   FOOT EXAM  07/25/2022   HEMOGLOBIN A1C  07/25/2022   Diabetic kidney evaluation - GFR measurement  06/07/2023   Pneumonia Vaccine 84+ Years old  Completed   DEXA SCAN  Completed   Hepatitis C Screening  Completed   HPV VACCINES  Aged Out    Chart Review Today: I personally reviewed active problem list, medication list, allergies, family history, social history, health maintenance, notes from last encounter, lab results, imaging with the patient/caregiver today.   Review of Systems  Constitutional: Negative.   HENT: Negative.    Eyes: Negative.   Respiratory: Negative.  Negative for cough, choking, chest tightness, shortness of breath and wheezing.   Cardiovascular:  Positive for palpitations.  Negative for chest pain and leg swelling.  Gastrointestinal: Negative.   Endocrine: Negative.   Genitourinary: Negative.   Musculoskeletal: Negative.   Skin: Negative.   Allergic/Immunologic: Negative.   Neurological:  Positive for tremors. Negative for dizziness, syncope, facial asymmetry, light-headedness, numbness and headaches.  Hematological: Negative.   Psychiatric/Behavioral:  The patient is nervous/anxious.   All other systems reviewed and are negative.    Objective:   Vitals:   06/20/22 1041  BP: (!) 160/90  Pulse: 97  Resp: 16  Temp: 98.3 F (36.8 C)  TempSrc: Oral  SpO2: 97%  Weight: 227 lb 6.4 oz (103.1 kg)  Height: '5\' 5"'$  (1.651 m)    Body mass index is 37.84 kg/m.  Physical Exam Vitals and nursing note reviewed.  Constitutional:      General: She is not in acute distress.    Appearance: She is obese. She is not ill-appearing, toxic-appearing or diaphoretic.  HENT:     Head: Normocephalic and atraumatic.     Right Ear: External ear normal.     Left Ear: External ear normal.     Nose: Nose normal.  Eyes:     General:        Right eye: No discharge.        Left eye: No discharge.     Conjunctiva/sclera: Conjunctivae normal.  Cardiovascular:     Rate and Rhythm: Normal rate and regular rhythm. No extrasystoles are present.    Chest Wall: PMI is not displaced.     Pulses: Normal pulses.          Radial pulses are 2+ on the right side and 2+ on the left side.     Heart sounds: Heart sounds not distant. No murmur heard.    No friction rub. No gallop.  Pulmonary:     Effort: Pulmonary effort is normal. No respiratory distress.     Breath sounds: Normal breath sounds. No stridor. No wheezing, rhonchi or rales.  Abdominal:     General: Bowel sounds are normal.     Palpations: Abdomen is soft.  Skin:    Capillary Refill: Capillary refill takes less than 2 seconds.     Coloration: Skin is not jaundiced or pale.     Findings: No bruising or erythema.   Neurological:     Mental Status: She is alert.     Motor:  Tremor present.  Psychiatric:        Attention and Perception: Attention normal.        Mood and Affect: Mood and affect normal.        Behavior: Behavior is cooperative.         Assessment & Plan:     ICD-10-CM   1. Accelerated hypertension  I10 For home use only DME Other see comment   increase valsartan dose, monitor BP w/ 2 week f/up, may need to add additional medication if still not controlled    2. Palpitations  R00.2    RRR in office today and on recent ED ECG    3. Essential hypertension  I10 valsartan (DIOVAN) 320 MG tablet    4. Anxiety  F41.9    feeling anxious when she has palpitations, no hx of arrhythmia, none noted during ED visit, RRR here today, difficult to say if secondary to anxiety only    5. Need for influenza vaccination  Z23 Flu Vaccine QUAD High Dose(Fluad)    6. OSA (obstructive sleep apnea)  G47.33    not currently using CPAP - pay be contributing to recent uncontrolled HTN - f/up with managing provider for new orders (neuro)     7. Encounter for examination following treatment at hospital  Z09    ED visit, ECG reports, labs all personally reviewed by me today, BP better than when in ED, no CP, LE edema orthopnea, brief palp 2x since ED visit     BP remained elevated with recheck today, she has no concerning cardiac sx or signs or sx concerning for end organ damage, the ED workup was reassuring, no labs need to be rechecked today Dose change and f/up current plan Managing anxiety with psychiatry and f/up on OSA/CPAP would also be helpful. Additional DASH efforts encouraged as well  Return for 2-3 month telephone F/Up on BP (unable to come back).   Delsa Grana, PA-C 06/20/22 10:53 AM  Addendum:  06/21/22 10:53 AM I called and spoke with the pharmacist about pt's medicine/pill packs -  He said that the increased dose of valsartan should be sent to her home because insurance would not  pay for an additional 160 mg tablet to be provided for her to add to what she currently has in the pill pack CMA will update the patient regarding this  Yesterday when patient was in the office I did look up the pills with her on her pill pack identified which pill in her morning dose is valsartan and I wrote it down on her pill pack that she currently had with her She will be instructed to take out the yellow oval tablet and omit that each morning and instead take the valsartan 320 that will be delivered to her from her pharmacy  BP cuff order also done - pt family member to help her use that in her home for BP monitoring and virtual f/up since pt states she is unable to return to office due to transportation to do bp f/up sooner.  If BP is not improved we could possibly add norvasc She does have a regular OV f/up in just over a month with her PCP.

## 2022-06-20 ENCOUNTER — Ambulatory Visit (INDEPENDENT_AMBULATORY_CARE_PROVIDER_SITE_OTHER): Payer: Medicare Other | Admitting: Family Medicine

## 2022-06-20 ENCOUNTER — Encounter: Payer: Self-pay | Admitting: Family Medicine

## 2022-06-20 VITALS — BP 154/84 | HR 97 | Temp 98.3°F | Resp 16 | Ht 65.0 in | Wt 227.4 lb

## 2022-06-20 DIAGNOSIS — Z23 Encounter for immunization: Secondary | ICD-10-CM

## 2022-06-20 DIAGNOSIS — R002 Palpitations: Secondary | ICD-10-CM

## 2022-06-20 DIAGNOSIS — F419 Anxiety disorder, unspecified: Secondary | ICD-10-CM

## 2022-06-20 DIAGNOSIS — Z09 Encounter for follow-up examination after completed treatment for conditions other than malignant neoplasm: Secondary | ICD-10-CM

## 2022-06-20 DIAGNOSIS — I10 Essential (primary) hypertension: Secondary | ICD-10-CM | POA: Diagnosis not present

## 2022-06-20 DIAGNOSIS — G4733 Obstructive sleep apnea (adult) (pediatric): Secondary | ICD-10-CM

## 2022-06-20 MED ORDER — VALSARTAN 320 MG PO TABS: 320.0000 mg | ORAL_TABLET | Freq: Every day | ORAL | 0 refills | Status: DC

## 2022-06-21 ENCOUNTER — Telehealth: Payer: Self-pay

## 2022-06-21 NOTE — Telephone Encounter (Signed)
Called pt no answer left vm to return my call. Also looked at [facility]- called relative no answer unable to leave vm due to being full. I called to inform patient her pharmacy will be able to provide her BP monitor kit, once provider is done with OV note I will fax the order today to her pharmacy. She should be hearing from them eventually since pharmacist stated some question wills be asked directly to patient. Per Kristeen Miss PCP- The pharmacy is sending her a 1 month supply of HIGHER dose Valsartan.  She will need to take the yellow oval tablet out of every morning dose (the valsartan 160) and set it aside and instead take one 320 from the new prescription bottle and do that all month until we follow up. I looked up her pill with her during her visit - I circled it on her pill back and showed her which one it is. look up pill finder valsartan 160 mg tablet and you can see the yellow pill to help you describe it to her - yellow, oval tablet HH on one side and 343 on the other side (I'm not sure if she is gonna be able to figure it out and do it all month or not)

## 2022-06-21 NOTE — Telephone Encounter (Signed)
Pt verbalized understanding.

## 2022-07-09 ENCOUNTER — Telehealth: Payer: Self-pay | Admitting: Family Medicine

## 2022-07-09 DIAGNOSIS — I10 Essential (primary) hypertension: Secondary | ICD-10-CM

## 2022-07-09 DIAGNOSIS — Z09 Encounter for follow-up examination after completed treatment for conditions other than malignant neoplasm: Secondary | ICD-10-CM

## 2022-07-09 NOTE — Telephone Encounter (Signed)
Pt saw Kristeen Miss last month for an OV and she was advised to call with her BP readings/ since her medication changed / pt stated she cant tell much of a difference   Pt stated her BP today is 143/78/ please advise

## 2022-07-09 NOTE — Telephone Encounter (Signed)
Pt has a follow up coming up with Dr.Sowles, do you want her to see you sooner?

## 2022-07-10 MED ORDER — VALSARTAN 320 MG PO TABS: 320.0000 mg | ORAL_TABLET | Freq: Every day | ORAL | 0 refills | Status: DC

## 2022-07-10 NOTE — Telephone Encounter (Signed)
Pt states she will be out of valsartan 320 due to 30 day supply before she even follows up with PCP. She mentioned she can take 2 of '160mg'$ s that she has been taking out of the bubble pack pills. Please advice

## 2022-07-10 NOTE — Telephone Encounter (Signed)
Pt notified- verbalized understanding.

## 2022-07-18 ENCOUNTER — Other Ambulatory Visit: Payer: Self-pay | Admitting: Family Medicine

## 2022-07-18 DIAGNOSIS — I10 Essential (primary) hypertension: Secondary | ICD-10-CM

## 2022-07-18 DIAGNOSIS — Z09 Encounter for follow-up examination after completed treatment for conditions other than malignant neoplasm: Secondary | ICD-10-CM

## 2022-07-18 MED ORDER — VALSARTAN 320 MG PO TABS: 320.0000 mg | ORAL_TABLET | Freq: Every day | ORAL | 0 refills | Status: DC

## 2022-07-18 NOTE — Telephone Encounter (Signed)
Pt stated her pharmacy informed her that they have not received the Rx refill request for valsartan (DIOVAN) 320 MG tablet. Pt asked that the Rx be resubmitted. [place], [place] Phone: [phone]  Fax: [phone]

## 2022-07-18 NOTE — Telephone Encounter (Signed)
Refilled 07/10/2022 #30 [id number] rf Requested Prescriptions  Pending Prescriptions Disp Refills   valsartan (DIOVAN) 320 MG tablet [Pharmacy Med Name: VALSARTAN 320 MG TABLET] 30 tablet 0    Sig: TAKE ONE TABLET BY MOUTH ONCE DAILY.     Cardiovascular:  Angiotensin Receptor Blockers Failed - 07/18/2022 10:05 AM      Failed - Last BP in normal range    BP Readings from Last 1 Encounters:  06/20/22 (!) 154/84         Passed - Cr in normal range and within 180 days    Creat  Date Value Ref Range Status  07/25/2021 0.66 0.50 - 1.05 mg/dL Final   Creatinine, Ser  Date Value Ref Range Status  06/06/2022 0.69 0.44 - 1.00 mg/dL Final   Creatinine, POC  Date Value Ref Range Status  05/30/2016 Negative mg/dL Final   Creatinine, Urine  Date Value Ref Range Status  07/25/2021 64 20 - 275 mg/dL Final         Passed - K in normal range and within 180 days    Potassium  Date Value Ref Range Status  06/06/2022 3.5 3.5 - 5.1 mmol/L Final  01/24/2014 3.6 3.5 - 5.1 mmol/L Final         Passed - Patient is not pregnant      Passed - Valid encounter within last 6 months    Recent Outpatient Visits           4 weeks ago Accelerated hypertension   [facility] Delsa Grana, PA-C   5 months ago Dyslipidemia associated with type 2 diabetes mellitus [facility])   [facility] Steele Sizer, MD   11 months ago Dyslipidemia associated with type 2 diabetes mellitus [facility])   [facility] Steele Sizer, MD   1 year ago Meningioma [facility])   [facility] Steele Sizer, MD   1 year ago Type 2 diabetes mellitus with obesity [facility])   [facility] Steele Sizer, MD       Future Appointments             In 1 week Steele Sizer, MD [facility], [facility]

## 2022-07-18 NOTE — Telephone Encounter (Signed)
Resending as pharmacy never received.  Requested Prescriptions  Pending Prescriptions Disp Refills   valsartan (DIOVAN) 320 MG tablet 30 tablet 0    Sig: Take 1 tablet (320 mg total) by mouth daily.     Cardiovascular:  Angiotensin Receptor Blockers Failed - 07/18/2022 10:35 AM      Failed - Last BP in normal range    BP Readings from Last 1 Encounters:  06/20/22 (!) 154/84         Passed - Cr in normal range and within 180 days    Creat  Date Value Ref Range Status  07/25/2021 0.66 0.50 - 1.05 mg/dL Final   Creatinine, Ser  Date Value Ref Range Status  06/06/2022 0.69 0.44 - 1.00 mg/dL Final   Creatinine, POC  Date Value Ref Range Status  05/30/2016 Negative mg/dL Final   Creatinine, Urine  Date Value Ref Range Status  07/25/2021 64 20 - 275 mg/dL Final         Passed - K in normal range and within 180 days    Potassium  Date Value Ref Range Status  06/06/2022 3.5 3.5 - 5.1 mmol/L Final  01/24/2014 3.6 3.5 - 5.1 mmol/L Final         Passed - Patient is not pregnant      Passed - Valid encounter within last 6 months    Recent Outpatient Visits           4 weeks ago Accelerated hypertension   [facility] Delsa Grana, PA-C   5 months ago Dyslipidemia associated with type 2 diabetes mellitus [facility])   [facility] Steele Sizer, MD   11 months ago Dyslipidemia associated with type 2 diabetes mellitus [facility])   [facility] Steele Sizer, MD   1 year ago Meningioma [facility] [address])   [facility] Steele Sizer, MD   1 year ago Type 2 diabetes mellitus with obesity [facility])   [facility] Steele Sizer, MD       Future Appointments             In 1 week Steele Sizer, MD [facility], [facility]

## 2022-07-24 NOTE — Progress Notes (Unsigned)
Name: Lauren [name]   MRN: [id number]    DOB: [birth year]   Date:07/25/2022       Progress Note  Subjective  Chief Complaint  Follow Up  HPI  SOB/Tachypenia: seen by Dr. Lanney [name], he felt like symptoms likely from extra pyramidal effects. Her breathing has been improving, she  has intermittent tachypnea. No wheezing or cough . She uses a paper bag prn when feels more SOB. 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   She is now living in [place] with her cousin - Lauren [name]  who manages her money and continues to pay her bills. She denies visual or auditory hallucinations She has medications pre-packaged and is compliant with oral medications and haldol monthly . Psychiatrist recently added the Ingrezza. Patient states she is very stressed, she states her cousin's mother has been sick and she has not been at home lately, patient feels like her cousin is a Ship broker and asks her to keep cleaning her house, she is also not allowed to leave the house because her cousin is worried about the land-lord raise the rent. She is very upset and stressed out. She states he is feeling exploited since she has to pay for rent and also buy food and cannot leave the house .Discussed talking to a Lauren [name], museum. She is trying to go to [facility] , Lauren [name] is helping her but she is unable to fill out the form on her own.    Recurrence of Meningioma: resection done in 2012 with developed of left side numbness, she went to [facility] and CT showed recurrence 01/2018 . She saw Dr. Lacinda [name] but she did not want to have repeat  surgery therefore seen by Dr. Baruch [name], s/p radiation.  She denies headaches, nausea, vomiting, but states she states tremors are intermittent now. History of seizures, taking Keppra and no symptoms in a long time. She saw Dr. Lacinda [name] this Summer 22 had another MRI brain that showed meniogioma is stable in size, follow up in 2 years She has seen ophthalmologist she states she has some visual problems but cannot explain what it is .   MRI brain done 04/17/2021 1. Stable appearance and size of 1.6 cm left frontal convexity meningioma and subjacent vasogenic edema. 2. Stable chronic white matter disease.   Aorta atherosclerosis: found on  CT chest done Nov 2018. She is on statin and we will Last labs done Nov 2022 and at goal    DMII:  she is off Metformin and A1C has gone up from 5.9 % to 6.2 % , 6.4 %, today is up to 6.7 % , she asked to resume her Metformin   Eye exam is up to date. She has polyphagia, polydipsia and polyuria. On statin therapy and valsartan, history of microalbuminuria, but last level was normal, she is due for repeat urine micro today   HTN: she is on Valsartan and tolerating it well. Her bp has spiked, went to [facility] and also seen by Lauren [name], she is compliant with medications, she states she has  been more stressed and trying to decide if she will move out of her niece's house.   Morbid obesity: BMI above 35 with co-morbidities such as DM, HTN and knee pain, discussed importance of portion control and healthier diet . Weight is stable.   OSA: she has not been using CPAP   Right Knee pain: she states over the past few months she noticed intermittent anterior right knee pain, it is sharp or aching , it is  intermittent, she has been using a cane. She has some grinding on her right knee, but no recent erythema or increase in warmth She states the pain started after a fall at home, currently the pain is 0/10 but can go up to 8/10. She had left knee replaced in the past. She was already seen by  sports medicine , Dr. Aline [name] on 01/15/2021 , she was given meloxicam and advised to had a  steroid injections, pain level today is 5/10 and stable   Patient Active Problem List   Diagnosis Date Noted   Dysarthria 07/25/2022   Seborrhea of face 01/22/2022   IgA monoclonal gammopathy of uncertain significance 04/26/2019   SS-A antibody positive 04/15/2019   Seizure-like activity ([place]) 12/29/2018   Seizures ([place]) 11/24/2018   Paresthesias 11/22/2018   Mouth dryness 10/12/2018   Hx of resection of meningioma 07/21/2018   Tremor 07/21/2018   Morbid obesity ([place]) 12/09/2017   Meningioma ([place]) 11/25/2017   Atherosclerosis of aorta ([place]) 11/25/2017   Calcification of coronary artery 11/25/2017   Schizophrenia, paranoid ([place]) 11/04/2017   Acid reflux 06/29/2015   Dyslipidemia associated with type 2 diabetes mellitus ([place]) 06/29/2015   Cardiac murmur 06/29/2015   Arthritis of knee, degenerative 06/28/2015   Insomnia w/ sleep apnea 03/21/2015   Anxiety disorder 03/21/2015   Essential hypertension 03/21/2015   HLD (hyperlipidemia) 03/21/2015   Urge incontinence 03/21/2015   OSA (obstructive sleep apnea) 02/02/2014    Past Surgical History:  Procedure Laterality Date   BRAIN TUMOR EXCISION  2012   benign   CARDIAC CATHETERIZATION  2008   CESAREAN SECTION     CYST REMOVAL HAND     LUNG LOBECTOMY  1977   benign tumor   TOTAL KNEE ARTHROPLASTY Left 01/28/2017   Procedure: TOTAL KNEE ARTHROPLASTY;  Surgeon: Lauren Mull, MD;  Location: [facility] ORS;  Service: Orthopedics;  Laterality: Left;    Family History  Problem Relation Age of Onset   Heart disease Brother    Depression Mother    Heart attack Mother    Stroke Mother    Alcohol abuse Father    Stroke Father    Diabetes Sister    Diabetes Sister    Stomach cancer Sister    Kidney disease Sister    COPD Brother    Lung cancer Brother    Diabetes Brother     Social History   Tobacco Use   Smoking status: Never    Smokeless tobacco: Never  Substance Use Topics   Alcohol use: No    Alcohol/week: 0.0 standard drinks of alcohol     Current Outpatient Medications:    amLODipine (NORVASC) 5 MG tablet, Take 1 tablet (5 mg total) by mouth daily., Disp: 30 tablet, Rfl: 0   desonide (DESOWEN) 0.05 % cream, Apply topically 2 (two) times daily., Disp: 60 g, Rfl: 2   gabapentin (NEURONTIN) 100 MG capsule, Take 1 capsule (100 mg total) by mouth 2 (two) times daily., Disp: 60 capsule, Rfl: 2   INGREZZA 40 &  80 MG CPPK, Take 1 capsule by mouth daily., Disp: , Rfl:    meloxicam (MOBIC) 7.5 MG tablet, Take 1 tablet (7.5 mg total) by mouth daily., Disp: 60 tablet, Rfl: 5   QC LO-DOSE ASPIRIN 81 MG EC tablet, Take 81 mg by mouth daily., Disp: , Rfl:    QUEtiapine Fumarate (SEROQUEL XR) 150 MG 24 hr tablet, Take 150 mg by mouth at bedtime., Disp: , Rfl:    rosuvastatin (CRESTOR) 40 MG tablet, Take 1 tablet (40 mg total) by mouth daily., Disp: 90 tablet, Rfl: 1   traZODone (DESYREL) 50 MG tablet, TAKE (1) TABLET BY MOUTH AT BEDTIME. (Patient taking differently: Take 50 mg by mouth at bedtime.), Disp: 30 tablet, Rfl: 0   valsartan (DIOVAN) 320 MG tablet, Take 1 tablet (320 mg total) by mouth daily., Disp: 30 tablet, Rfl: 0   albuterol (VENTOLIN HFA) 108 (90 Base) MCG/ACT inhaler, Inhale 2 puffs into the lungs every 4 (four) hours as needed. (Patient not taking: Reported on 06/20/2022), Disp: , Rfl:    haloperidol decanoate (HALDOL DECANOATE) 100 MG/ML injection, Inject 100 mg into the muscle every 28 (twenty-eight) days. (Patient not taking: Reported on 07/25/2022), Disp: , Rfl:   Allergies  Allergen Reactions   Percocet [Oxycodone-Acetaminophen] Diarrhea, Nausea And Vomiting and Nausea Only   Tramadol Hcl Diarrhea, Nausea And Vomiting and Nausea Only   Vicodin [Hydrocodone-Acetaminophen] Diarrhea, Nausea And Vomiting and Nausea Only    I personally reviewed active problem list, medication list, allergies, family  history, social history, health maintenance with the patient/caregiver today.   ROS  Ten systems reviewed and is negative except as mentioned in HPI   Objective  Vitals:   07/25/22 1027 07/25/22 1028 07/25/22 1032  BP: (!) 162/92 (!) 192/103 (!) 192/98  Pulse: 94    Resp: 18    Temp: 97.7 F (36.5 C)    TempSrc: Oral    SpO2: 94%    Weight: 226 lb 12.8 oz (102.9 kg)    Height: '5\' 5"'$  (1.651 m)      Body mass index is 37.74 kg/m.  Physical Exam  Constitutional: Patient appears well-developed and well-nourished. Obese  No distress.  HEENT: head atraumatic, normocephalic, pupils equal and reactive to light, neck supple Cardiovascular: Normal rate, regular rhythm and normal heart sounds.  No murmur heard. No BLE edema. Pulmonary/Chest: Effort normal and breath sounds normal. No respiratory distress. Abdominal: Soft.  There is no tenderness. Neurologist: mild tremors, dysarthria, stable  Psychiatric: cooperative, talking more than usual, not sure if paranoid or truly being unhappy at cousins home  Recent Results (from the past 2160 hour(s))  Basic metabolic panel     Status: Abnormal   Collection Time: 06/06/22  4:13 PM  Result Value Ref Range   Sodium 139 135 - 145 mmol/L   Potassium 3.5 3.5 - 5.1 mmol/L   Chloride 105 98 - 111 mmol/L   CO2 26 22 - 32 mmol/L   Glucose, Bld 166 (H) 70 - 99 mg/dL    Comment: Glucose reference range applies only to samples taken after fasting for at least 8 hours.   BUN 10 8 - 23 mg/dL   Creatinine, Ser 0.69 0.44 - 1.00 mg/dL   Calcium 9.2 8.9 - 10.3 mg/dL   GFR, Estimated >60 >60 mL/min    Comment: (NOTE) Calculated using the CKD-EPI Creatinine Equation (2021)    Anion gap 8 5 - 15    Comment: Performed at [facility], [address], [place],  [place] [address]  CBC with Differential     Status: Abnormal   Collection Time: 06/06/22  4:13 PM  Result Value Ref Range   WBC 5.2 4.0 - 10.5 K/uL   RBC 4.74 3.87 - 5.11 MIL/uL   Hemoglobin  15.2 (H) 12.0 - 15.0 g/dL   HCT 45.0 36.0 - 46.0 %   MCV 94.9 80.0 - 100.0 fL   MCH 32.1 26.0 - 34.0 pg   MCHC 33.8 30.0 - 36.0 g/dL   RDW 13.8 11.5 - 15.5 %   Platelets 159 150 - 400 K/uL   nRBC 0.0 0.0 - 0.2 %   Neutrophils Relative % 63 %   Neutro Abs 3.2 1.7 - 7.7 K/uL   Lymphocytes Relative 28 %   Lymphs Abs 1.5 0.7 - 4.0 K/uL   Monocytes Relative 7 %   Monocytes Absolute 0.4 0.1 - 1.0 K/uL   Eosinophils Relative 1 %   Eosinophils Absolute 0.1 0.0 - 0.5 K/uL   Basophils Relative 1 %   Basophils Absolute 0.1 0.0 - 0.1 K/uL   Immature Granulocytes 0 %   Abs Immature Granulocytes 0.01 0.00 - 0.07 K/uL    Comment: Performed at [facility], [address] [place], [place] [address]  Troponin I (High Sensitivity)     Status: None   Collection Time: 06/06/22  4:13 PM  Result Value Ref Range   Troponin I (High Sensitivity) 7 <18 ng/L    Comment: (NOTE) Elevated high sensitivity troponin I (hsTnI) values and significant  changes across serial measurements may suggest ACS but many other  chronic and acute conditions are known to elevate hsTnI results.  Refer to the "Links" section for chest pain algorithms and additional  guidance. Performed at [facility], [address] [place], [place] [address]   Brain natriuretic peptide     Status: None   Collection Time: 06/06/22  5:59 PM  Result Value Ref Range   B Natriuretic Peptide 14.0 0.0 - 100.0 pg/mL    Comment: Performed at [facility], [address] [place], [place] [address]  Troponin I (High Sensitivity)     Status: None   Collection Time: 06/06/22  5:59 PM  Result Value Ref Range   Troponin I (High Sensitivity) 7 <18 ng/L    Comment: (NOTE) Elevated high sensitivity troponin I (hsTnI) values and significant  changes across serial measurements may suggest ACS but many other  chronic and acute conditions are known to elevate hsTnI results.  Refer to the "Links" section for chest pain algorithms and additional   guidance. Performed at [facility], [address], [place], [place] [address]   Urinalysis, Routine w reflex microscopic Urine, Clean Catch     Status: Abnormal   Collection Time: 06/06/22 11:20 PM  Result Value Ref Range   Color, Urine YELLOW YELLOW   APPearance CLEAR CLEAR   Specific Gravity, Urine 1.020 1.005 - 1.030   pH 6.0 5.0 - 8.0   Glucose, UA NEGATIVE NEGATIVE mg/dL   Hgb urine dipstick NEGATIVE NEGATIVE   Bilirubin Urine NEGATIVE NEGATIVE   Ketones, ur 5 (A) NEGATIVE mg/dL   Protein, ur 100 (A) NEGATIVE mg/dL   Nitrite NEGATIVE NEGATIVE   Leukocytes,Ua NEGATIVE NEGATIVE   RBC / HPF 0-5 0 - 5 RBC/hpf   WBC, UA 0-5 0 - 5 WBC/hpf   Bacteria, UA RARE (A) NONE SEEN   Squamous Epithelial / LPF 0-5 0 - 5   Mucus PRESENT     Comment: Performed  at [facility], [address], [place], [place] [address]  POCT HgB A1C     Status: Abnormal   Collection Time: 07/25/22 10:32 AM  Result Value Ref Range   Hemoglobin A1C 6.7 (A) 4.0 - 5.6 %   HbA1c POC (<> result, manual entry)     HbA1c, POC (prediabetic range)     HbA1c, POC (controlled diabetic range)      Diabetic Foot Exam: Diabetic Foot Exam - Simple   Simple Foot Form Visual Inspection See comments: Yes Sensation Testing Intact to touch and monofilament testing bilaterally: Yes Pulse Check Posterior Tibialis and Dorsalis pulse intact bilaterally: Yes Comments Thick toenails       PHQ2/9:    07/25/2022   10:31 AM 06/20/2022   10:40 AM 01/22/2022    3:11 PM 01/22/2022    2:55 PM 07/25/2021    1:23 PM  Depression screen PHQ 2/9  Decreased Interest '1 1 2 2 '$ 0  Down, Depressed, Hopeless 1 1 0 0 0  PHQ - 2 Score '2 2 2 2 '$ 0  Altered sleeping '1 1 3 3 '$ 0  Tired, decreased energy '1 1 2 2 '$ 0  Change in appetite 0 0 1 1 0  Feeling bad or failure about yourself  0 0 0 0 0  Trouble concentrating 0 0 1 1 0  Moving slowly or fidgety/restless 0 0 1 1 0  Suicidal thoughts 0 0 0 0 0  PHQ-9 Score '4 4 10 10 '$ 0  Difficult  doing work/chores Not difficult at all Somewhat difficult Extremely dIfficult Extremely dIfficult     phq 9 is positive   Fall Risk:    07/25/2022   10:29 AM 06/20/2022   10:40 AM 01/22/2022    3:13 PM 01/22/2022    2:54 PM 07/25/2021    1:23 PM  [place] in the past year? 0 0 1 1 0  Number falls in past yr:  0 0 0 0  Injury with Fall?  0 0 0 0  Risk for fall due to : Impaired balance/gait No Fall Risks No Fall Risks History of fall(s);Impaired balance/gait No Fall Risks  Follow up Falls prevention discussed;Lauren provided;Falls evaluation completed Falls prevention discussed;Lauren provided Falls prevention discussed Falls evaluation completed;Lauren provided;Falls prevention discussed Falls prevention discussed     Assessment & Plan  1. Dyslipidemia associated with type 2 diabetes mellitus (HCC)  - POCT HgB A1C - HM Diabetes Foot Exam - Urine Microalbumin w/creat. ratio - Lipid panel - COMPLETE METABOLIC PANEL WITH GFR - metFORMIN (GLUCOPHAGE-XR) 500 MG 24 hr tablet; Take 1 tablet (500 mg total) by mouth daily with breakfast.  Dispense: 30 tablet; Refill: 5  2. Meningioma ([place])  Follow up with neurosurgeon and imaging next year   3. Atherosclerosis of aorta ([place])  On statin therapy   4. Morbid obesity ([place])  Discussed with the patient the risk posed by an increased BMI. Discussed importance of portion control, calorie counting and at least 150 minutes of physical activity weekly. Avoid sweet beverages and drink more water. Eat at least 6 servings of fruit and vegetables daily    5. Dysarthria  Stable   6. IgA monoclonal gammopathy of uncertain significance   7. Other schizoaffective disorders ([place])  - Ambulatory referral to Social Work  8. OSA (obstructive sleep apnea)  Needs to have repeat CPAP but states difficulty with transportation and is trying to move back to [place]   9. Accelerated hypertension  - COMPLETE  METABOLIC PANEL  WITH GFR  10. Seizure-like activity (HCC)  No longer on Keppra   11. Seborrhea of face  - hydrocortisone 1 % ointment; Apply topically 2 (two) times daily.  Dispense: 30 g; Refill: 2

## 2022-07-25 ENCOUNTER — Encounter: Payer: Self-pay | Admitting: Family Medicine

## 2022-07-25 ENCOUNTER — Ambulatory Visit (INDEPENDENT_AMBULATORY_CARE_PROVIDER_SITE_OTHER): Payer: Medicare Other | Admitting: Family Medicine

## 2022-07-25 VITALS — BP 192/98 | HR 94 | Temp 97.7°F | Resp 18 | Ht 65.0 in | Wt 226.8 lb

## 2022-07-25 DIAGNOSIS — Z1211 Encounter for screening for malignant neoplasm of colon: Secondary | ICD-10-CM

## 2022-07-25 DIAGNOSIS — R569 Unspecified convulsions: Secondary | ICD-10-CM

## 2022-07-25 DIAGNOSIS — D329 Benign neoplasm of meninges, unspecified: Secondary | ICD-10-CM | POA: Diagnosis not present

## 2022-07-25 DIAGNOSIS — G4733 Obstructive sleep apnea (adult) (pediatric): Secondary | ICD-10-CM

## 2022-07-25 DIAGNOSIS — E1169 Type 2 diabetes mellitus with other specified complication: Secondary | ICD-10-CM

## 2022-07-25 DIAGNOSIS — I10 Essential (primary) hypertension: Secondary | ICD-10-CM

## 2022-07-25 DIAGNOSIS — D472 Monoclonal gammopathy: Secondary | ICD-10-CM

## 2022-07-25 DIAGNOSIS — R471 Dysarthria and anarthria: Secondary | ICD-10-CM

## 2022-07-25 DIAGNOSIS — I700 Atherosclerosis of aorta: Secondary | ICD-10-CM

## 2022-07-25 DIAGNOSIS — L219 Seborrheic dermatitis, unspecified: Secondary | ICD-10-CM

## 2022-07-25 DIAGNOSIS — E785 Hyperlipidemia, unspecified: Secondary | ICD-10-CM

## 2022-07-25 DIAGNOSIS — F258 Other schizoaffective disorders: Secondary | ICD-10-CM

## 2022-07-25 LAB — POCT GLYCOSYLATED HEMOGLOBIN (HGB A1C): Hemoglobin A1C: 6.7 % — AB (ref 4.0–5.6)

## 2022-07-25 MED ORDER — METFORMIN HCL ER 500 MG PO TB24: 500.0000 mg | ORAL_TABLET | Freq: Every day | ORAL | 5 refills | Status: DC

## 2022-07-25 MED ORDER — HYDROCORTISONE 1 % EX OINT: TOPICAL_OINTMENT | Freq: Two times a day (BID) | CUTANEOUS | 2 refills | Status: DC

## 2022-07-25 MED ORDER — AMLODIPINE BESYLATE 5 MG PO TABS: 5.0000 mg | ORAL_TABLET | Freq: Every day | ORAL | 0 refills | Status: DC

## 2022-07-26 ENCOUNTER — Ambulatory Visit: Payer: Medicare Other | Admitting: Family Medicine

## 2022-07-26 LAB — COMPLETE METABOLIC PANEL WITH GFR
AG Ratio: 1.4 (calc) (ref 1.0–2.5)
ALT: 18 U/L (ref 6–29)
AST: 14 U/L (ref 10–35)
Albumin: 4 g/dL (ref 3.6–5.1)
Alkaline phosphatase (APISO): 58 U/L (ref 37–153)
BUN: 17 mg/dL (ref 7–25)
CO2: 30 mmol/L (ref 20–32)
Calcium: 9.7 mg/dL (ref 8.6–10.4)
Chloride: 104 mmol/L (ref 98–110)
Creat: 0.88 mg/dL (ref 0.50–1.05)
Globulin: 2.8 g/dL (calc) (ref 1.9–3.7)
Glucose, Bld: 145 mg/dL — ABNORMAL HIGH (ref 65–99)
Potassium: 3.8 mmol/L (ref 3.5–5.3)
Sodium: 142 mmol/L (ref 135–146)
Total Bilirubin: 0.5 mg/dL (ref 0.2–1.2)
Total Protein: 6.8 g/dL (ref 6.1–8.1)
eGFR: 71 mL/min/{1.73_m2} (ref >=60)

## 2022-07-26 LAB — LIPID PANEL
Cholesterol: 132 mg/dL (ref <200)
HDL: 54 mg/dL (ref >=50)
LDL Cholesterol (Calc): 62 mg/dL (calc)
Non-HDL Cholesterol (Calc): 78 mg/dL (calc) (ref <130)
Total CHOL/HDL Ratio: 2.4 (calc) (ref <5.0)
Triglycerides: 82 mg/dL (ref <150)

## 2022-07-26 LAB — MICROALBUMIN / CREATININE URINE RATIO
Creatinine, Urine: 233 mg/dL (ref 20–275)
Microalb Creat Ratio: 197 mcg/mg creat — ABNORMAL HIGH (ref <30)
Microalb, Ur: 45.9 mg/dL

## 2022-08-06 ENCOUNTER — Telehealth: Payer: Self-pay | Admitting: Family Medicine

## 2022-08-06 NOTE — Telephone Encounter (Signed)
The patient has returned missed call from the practice and would like to speak with a member of clinical staff when possible   Please contact further when available

## 2022-08-06 NOTE — Telephone Encounter (Signed)
Called and lvm letting her know if needed we can do a sooner refill of a different dose

## 2022-08-06 NOTE — Telephone Encounter (Signed)
Pt called to report that she received her prescription for Amlodipine however the lid was not secure and some of them dropped in between her sofa and she is unable to reach them.   [place], [address]  [place] [address]  Phone: [phone] Fax: [phone]

## 2022-08-06 NOTE — Telephone Encounter (Signed)
Patient lost some of her medication.

## 2022-08-07 NOTE — Telephone Encounter (Signed)
Called and lvm

## 2022-08-07 NOTE — Telephone Encounter (Signed)
Called x2

## 2022-08-14 ENCOUNTER — Telehealth: Payer: Self-pay | Admitting: Family Medicine

## 2022-08-14 DIAGNOSIS — Z09 Encounter for follow-up examination after completed treatment for conditions other than malignant neoplasm: Secondary | ICD-10-CM

## 2022-08-14 DIAGNOSIS — I10 Essential (primary) hypertension: Secondary | ICD-10-CM

## 2022-08-28 ENCOUNTER — Ambulatory Visit: Payer: Medicare Other | Admitting: Family Medicine

## 2022-08-29 ENCOUNTER — Telehealth: Payer: Self-pay

## 2022-09-05 ENCOUNTER — Other Ambulatory Visit: Payer: Self-pay | Admitting: *Deleted

## 2022-09-05 NOTE — Patient Outreach (Signed)
  Care Coordination   09/05/2022  Name: Lauren [name] MRN: [id number] DOB: [birth year]   Care Coordination Outreach Attempts:  An unsuccessful telephone outreach was attempted today to offer the patient information about available care coordination services as a benefit of their health plan. HIPAA compliant message left on voicemail, providing contact information for CSW, encouraging patient to return CSW's call at her earliest convenience.  Follow Up Plan:  Additional outreach attempts will be made to offer the patient care coordination information and services.   Encounter Outcome:  No Answer.   Care Coordination Interventions:  No, not indicated.    Nat Christen, BSW, MSW, LCSW  Licensed Education officer, [facility]  Mailing [place] [address], [place], [place] [address] Physical Address[address] [place], [place] [address] Toll Free Main # [phone] Fax # [phone] Cell # [phone] [name]

## 2022-09-12 DIAGNOSIS — R251 Tremor, unspecified: Secondary | ICD-10-CM | POA: Diagnosis not present

## 2022-09-12 DIAGNOSIS — R062 Wheezing: Secondary | ICD-10-CM | POA: Diagnosis not present

## 2022-09-12 DIAGNOSIS — J45909 Unspecified asthma, uncomplicated: Secondary | ICD-10-CM | POA: Diagnosis not present

## 2022-09-12 DIAGNOSIS — R519 Headache, unspecified: Secondary | ICD-10-CM | POA: Diagnosis not present

## 2022-09-13 ENCOUNTER — Telehealth: Payer: Self-pay | Admitting: *Deleted

## 2022-09-13 ENCOUNTER — Other Ambulatory Visit: Payer: Self-pay | Admitting: Family Medicine

## 2022-09-13 DIAGNOSIS — Z09 Encounter for follow-up examination after completed treatment for conditions other than malignant neoplasm: Secondary | ICD-10-CM

## 2022-09-13 DIAGNOSIS — I10 Essential (primary) hypertension: Secondary | ICD-10-CM

## 2022-09-13 NOTE — Progress Notes (Signed)
  Care Coordination   Note   09/13/2022 Name: Lauren [name] MRN: [id number] DOB: 12/01/1950  Lauren [name] is a 72 y.o. year old female who sees Steele Sizer, MD for primary care. I reached out to [employer] by phone today to offer care coordination services.  [name] was given information about Care Coordination services today including:   The Care Coordination services include support from the care team which includes your Nurse Coordinator, Clinical Social Worker, or Pharmacist.  The Care Coordination team is here to help remove barriers to the health concerns and goals most important to you. Care Coordination services are voluntary, and the patient may decline or stop services at any time by request to their care team member.   Care Coordination Consent Status: Patient agreed to services and verbal consent obtained.   Follow up plan:  Telephone appointment with care coordination team member scheduled for:  09/17/2022  Encounter Outcome:  Pt. Scheduled  Julian Hy, [place] Direct Dial: [phone]

## 2022-09-17 ENCOUNTER — Ambulatory Visit: Payer: Self-pay

## 2022-09-17 NOTE — Progress Notes (Unsigned)
Name: Lauren [name]   MRN: [id number]    DOB: Feb 27, 1951   Date:09/17/2022       Progress Note  Subjective  Chief Complaint  Follow Up  HPI  SOB/Tachypenia: seen by Dr. Lanney Gins, he felt like symptoms likely from extra pyramidal effects. Her breathing has been improving, she  has intermittent tachypnea. No wheezing or cough . She uses a paper bag prn when feels more SOB. 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   She is now living in [place] with her cousin - [name]  who manages her money and continues to pay her bills. She denies visual or auditory hallucinations She has medications pre-packaged and is compliant with oral medications and haldol monthly . Psychiatrist recently added the Ingrezza. Patient states she is very stressed, she states her cousin's mother has been sick and she has not been at home lately, patient feels like her cousin is a Ship broker and asks her to keep cleaning her house, she is also not allowed to leave the house because her cousin is worried about the land-lord raise the rent. She is very upset and stressed out. She states he is feeling exploited since she has to pay for rent and also buy food and cannot leave the house .Discussed talking to a Education officer, museum. She is trying to go to [facility] , [name] is helping her but she is unable to fill out the form on her own.    Recurrence of Meningioma: resection done in 2012 with developed of left side numbness, she went to [facility] and CT showed recurrence 01/2018 . She saw Dr. Lacinda Axon but she did not want to have repeat  surgery therefore seen by Dr. Baruch Gouty, s/p radiation.  She denies headaches, nausea, vomiting, but states she states tremors are intermittent now. History of seizures, taking Keppra and no symptoms in a long time. She saw Dr. Lacinda Axon this Summer 22 had another MRI brain that showed meniogioma is stable in size, follow up in 2 years She has seen ophthalmologist she states she has some visual problems but cannot explain what it is .   MRI brain done 04/17/2021 1. Stable appearance and size of 1.6 cm left frontal convexity meningioma and subjacent vasogenic edema. 2. Stable chronic white matter disease.   Aorta atherosclerosis: found on  CT chest done Nov 2018. She is on statin and we will Last labs done Nov 2022 and at goal    DMII:  she is off Metformin and A1C has gone up from 5.9 % to 6.2 % , 6.4 %, today is up to 6.7 % , she asked to resume her Metformin   Eye exam is up to date. She has polyphagia, polydipsia and polyuria. On statin therapy and valsartan, history of microalbuminuria, but last level was normal, she is due for repeat urine micro today   HTN: she is on Valsartan and tolerating it well. Her bp has spiked, went to [facility] and also seen by Kristeen Miss, she is compliant with medications, she states she has  been more stressed and trying to decide if she will move out of her niece's house.   Morbid obesity: BMI above 35 with co-morbidities such as DM, HTN and knee pain, discussed importance of portion control and healthier diet . Weight is stable.   OSA: she has not been using CPAP   Right Knee pain: she states over the past few months she noticed intermittent anterior right knee pain, it is sharp or aching , it is  intermittent, she has been using a cane. She has some grinding on her right knee, but no recent erythema or increase in warmth She states the pain started after a fall at home, currently the pain is 0/10 but can go up to 8/10. She had left knee replaced in the past. She was already seen by  sports medicine , Dr. Aline Brochure on 01/15/2021 , she was given meloxicam and advised to had a  steroid injections, pain level today is 5/10 and stable   Patient Active Problem List   Diagnosis Date Noted   Dysarthria 07/25/2022   Seborrhea of face 01/22/2022   IgA monoclonal gammopathy of uncertain significance 04/26/2019   SS-A antibody positive 04/15/2019   Seizure-like activity ([place]) 12/29/2018   Seizures ([place]) 11/24/2018   Paresthesias 11/22/2018   Mouth dryness 10/12/2018   Hx of resection of meningioma 07/21/2018   Tremor 07/21/2018   Morbid obesity ([place]) 12/09/2017   Meningioma ([place]) 11/25/2017   Atherosclerosis of aorta ([place]) 11/25/2017   Calcification of coronary artery 11/25/2017   Schizophrenia, paranoid ([place]) 11/04/2017   Acid reflux 06/29/2015   Dyslipidemia associated with type 2 diabetes mellitus ([place]) 06/29/2015   Cardiac murmur 06/29/2015   Arthritis of knee, degenerative 06/28/2015   Insomnia w/ sleep apnea 03/21/2015   Anxiety disorder 03/21/2015   Essential hypertension 03/21/2015   HLD (hyperlipidemia) 03/21/2015   Urge incontinence 03/21/2015   OSA (obstructive sleep apnea) 02/02/2014    Past Surgical History:  Procedure Laterality Date   BRAIN TUMOR EXCISION  2012   benign   CARDIAC CATHETERIZATION  2008   CESAREAN SECTION     CYST REMOVAL HAND     LUNG LOBECTOMY  1977   benign tumor   TOTAL KNEE ARTHROPLASTY Left 01/28/2017   Procedure: TOTAL KNEE ARTHROPLASTY;  Surgeon: Corky Mull, MD;  Location: [facility] ORS;  Service: Orthopedics;  Laterality: Left;    Family History  Problem Relation Age of Onset   Heart disease Brother    Depression Mother    Heart attack Mother    Stroke Mother    Alcohol abuse Father    Stroke Father    Diabetes Sister    Diabetes Sister    Stomach cancer Sister    Kidney disease Sister    COPD Brother    Lung cancer Brother    Diabetes Brother     Social History   Tobacco Use   Smoking status: Never    Smokeless tobacco: Never  Substance Use Topics   Alcohol use: No    Alcohol/week: 0.0 standard drinks of alcohol     Current Outpatient Medications:    albuterol (VENTOLIN HFA) 108 (90 Base) MCG/ACT inhaler, Inhale 2 puffs into the lungs every 4 (four) hours as needed. (Patient not taking: Reported on 06/20/2022), Disp: , Rfl:    amLODipine (NORVASC) 5 MG tablet, TAKE 1 TABLET BY MOUTH DAILY, Disp: 30 tablet, Rfl: 0   gabapentin (NEURONTIN) 100 MG capsule, Take 1 capsule (100 mg total) by mouth 2 (two)  times daily., Disp: 60 capsule, Rfl: 2   haloperidol decanoate (HALDOL DECANOATE) 100 MG/ML injection, Inject 100 mg into the muscle every 28 (twenty-eight) days. (Patient not taking: Reported on 07/25/2022), Disp: , Rfl:    hydrocortisone 1 % ointment, Apply topically 2 (two) times daily., Disp: 30 g, Rfl: 2   INGREZZA 40 & 80 MG CPPK, Take 1 capsule by mouth daily., Disp: , Rfl:    meloxicam (MOBIC) 7.5 MG tablet, Take 1 tablet (7.5 mg total) by mouth daily., Disp: 60 tablet, Rfl: 5   metFORMIN (GLUCOPHAGE-XR) 500 MG 24 hr tablet, Take 1 tablet (500 mg total) by mouth daily with breakfast., Disp: 30 tablet, Rfl: 5   QC LO-DOSE ASPIRIN 81 MG EC tablet, Take 81 mg by mouth daily., Disp: , Rfl:    QUEtiapine Fumarate (SEROQUEL XR) 150 MG 24 hr tablet, Take 150 mg by mouth at bedtime., Disp: , Rfl:    rosuvastatin (CRESTOR) 40 MG tablet, TAKE 1 TABLET BY MOUTH ONCE DAILY., Disp: 30 tablet, Rfl: 0   traZODone (DESYREL) 50 MG tablet, TAKE (1) TABLET BY MOUTH AT BEDTIME. (Patient taking differently: Take 50 mg by mouth at bedtime.), Disp: 30 tablet, Rfl: 0   valsartan (DIOVAN) 320 MG tablet, TAKE (1) TABLET BY MOUTH ONCE DAILY., Disp: 30 tablet, Rfl: 0  Allergies  Allergen Reactions   Percocet [Oxycodone-Acetaminophen] Diarrhea, Nausea And Vomiting and Nausea Only   Tramadol Hcl Diarrhea, Nausea And Vomiting and Nausea Only   Vicodin [Hydrocodone-Acetaminophen] Diarrhea, Nausea And Vomiting and  Nausea Only    I personally reviewed active problem list, medication list, allergies, family history, social history, health maintenance with the patient/caregiver today.   ROS  ***  Objective  There were no vitals filed for this visit.  There is no height or weight on file to calculate BMI.  Physical Exam ***  Recent Results (from the past 2160 hour(s))  POCT HgB A1C     Status: Abnormal   Collection Time: 07/25/22 10:32 AM  Result Value Ref Range   Hemoglobin A1C 6.7 (A) 4.0 - 5.6 %   HbA1c POC (<> result, manual entry)     HbA1c, POC (prediabetic range)     HbA1c, POC (controlled diabetic range)    Urine Microalbumin w/creat. ratio     Status: Abnormal   Collection Time: 07/25/22 11:11 AM  Result Value Ref Range   Creatinine, Urine 233 20 - 275 mg/dL   Microalb, Ur 45.9 mg/dL    Comment: Verified by repeat analysis. [name] Reference Range Not established    Microalb Creat Ratio 197 (H) <30 mcg/mg creat    Comment: . The ADA defines abnormalities in albumin excretion as follows: [name] Albuminuria Category        Result (mcg/mg creatinine) . Normal to Mildly increased   <30 Moderately increased         30-299  Severely increased           > OR = 300 . The ADA recommends that at least two of three specimens collected within a 3-6 month period be abnormal before considering a patient to be within a diagnostic category.   Lipid panel     Status: None   Collection Time: 07/25/22 11:11 AM  Result Value Ref Range   Cholesterol 132 <200 mg/dL   HDL 54 > OR = 50 mg/dL   Triglycerides 82 <150 mg/dL   LDL Cholesterol (Calc) 62 mg/dL (calc)    Comment: Reference range: <100 . Desirable range <  100 mg/dL for primary prevention;   <70 mg/dL for patients with CHD or diabetic patients  with > or = 2 CHD risk factors. [name] LDL-C is now calculated using the Martin-Hopkins  calculation, which is a validated novel method providing  better accuracy than the Friedewald equation in  the  estimation of LDL-C.  Cresenciano Genre et al. Annamaria Helling. 8502;774(12): 2061-2068  (http://education.[employer].com/faq/FAQ164)    Total CHOL/HDL Ratio 2.4 <5.0 (calc)   Non-HDL Cholesterol (Calc) 78 <130 mg/dL (calc)    Comment: For patients with diabetes plus 1 major ASCVD risk  factor, treating to a non-HDL-C goal of <100 mg/dL  (LDL-C of <70 mg/dL) is considered a therapeutic  option.   COMPLETE METABOLIC PANEL WITH GFR     Status: Abnormal   Collection Time: 07/25/22 11:11 AM  Result Value Ref Range   Glucose, Bld 145 (H) 65 - 99 mg/dL    Comment: .            Fasting reference interval . For someone without known diabetes, a glucose value >125 mg/dL indicates that they may have diabetes and this should be confirmed with a follow-up test. .    BUN 17 7 - 25 mg/dL   Creat 0.88 0.50 - 1.05 mg/dL   eGFR 71 > OR = 60 mL/min/1.7m   BUN/Creatinine Ratio SEE NOTE: 6 - 22 (calc)    Comment:    Not Reported: BUN and Creatinine are within    reference range. .    Sodium 142 135 - 146 mmol/L   Potassium 3.8 3.5 - 5.3 mmol/L   Chloride 104 98 - 110 mmol/L   CO2 30 20 - 32 mmol/L   Calcium 9.7 8.6 - 10.4 mg/dL   Total Protein 6.8 6.1 - 8.1 g/dL   Albumin 4.0 3.6 - 5.1 g/dL   Globulin 2.8 1.9 - 3.7 g/dL (calc)   AG Ratio 1.4 1.0 - 2.5 (calc)   Total Bilirubin 0.5 0.2 - 1.2 mg/dL   Alkaline phosphatase (APISO) 58 37 - 153 U/L   AST 14 10 - 35 U/L   ALT 18 6 - 29 U/L    PHQ2/9:    07/25/2022   10:31 AM 06/20/2022   10:40 AM 01/22/2022    3:11 PM 01/22/2022    2:55 PM 07/25/2021    1:23 PM  Depression screen PHQ 2/9  Decreased Interest '1 1 2 2 '$ 0  Down, Depressed, Hopeless 1 1 0 0 0  PHQ - 2 Score '2 2 2 2 '$ 0  Altered sleeping '1 1 3 3 '$ 0  Tired, decreased energy '1 1 2 2 '$ 0  Change in appetite 0 0 1 1 0  Feeling bad or failure about yourself  0 0 0 0 0  Trouble concentrating 0 0 1 1 0  Moving slowly or fidgety/restless 0 0 1 1 0  Suicidal thoughts 0 0 0 0 0  PHQ-9 Score  '4 4 10 10 '$ 0  Difficult doing work/chores Not difficult at all Somewhat difficult Extremely dIfficult Extremely dIfficult     phq 9 is {gen pos [id number]}  Fall Risk:    07/25/2022   10:29 AM 06/20/2022   10:40 AM 01/22/2022    3:13 PM 01/22/2022    2:54 PM 07/25/2021    1:23 PM  Fall Risk   Falls in the past year? 0 0 1 1 0  Number falls in past yr:  0 0 0 0  Injury with Fall?  0 0 0  0  Risk for fall due to : Impaired balance/gait No Fall Risks No Fall Risks History of fall(s);Impaired balance/gait No Fall Risks  Follow up Falls prevention discussed;Education provided;Falls evaluation completed Falls prevention discussed;Education provided Falls prevention discussed Falls evaluation completed;Education provided;Falls prevention discussed Falls prevention discussed      Functional Status Survey:      Assessment & Plan  *** There are no diagnoses linked to this encounter.

## 2022-09-17 NOTE — Patient Outreach (Signed)
  Care Coordination   Initial Visit Note   09/17/2022 Name: Lauren [name] MRN: [id number] DOB: 24-Sep-1950  Lauren [name] is a 72 y.o. year old female who sees Steele Sizer, MD for primary care. I spoke with  [name] by phone today.  What matters to the patients health and wellness today?  I would like to move    Goals Addressed             This Visit's Progress    COMPLETED: Care Coordination Activities       Care Coordination Interventions: Determined the patient would like to move from her current place of residence because she feels she does not have enough room Discussed barriers to housing due to limited options to meet patients budget Obtained permission from patient to contact [name] to review what resources they are assisting with Spoke with Adam who indicates they have spoken with patient about housing and she is in a good environment; no resources have been identified for patient to move Discussed plan for this SW to sign off considering patient is actively engaged with [place] team who can assist with housing changes as appropriate         SDOH assessments and interventions completed:  No     Care Coordination Interventions:  Yes, provided   Follow up plan: No further intervention required.   Encounter Outcome:  Pt. Visit Completed   Daneen Schick, BSW, CDP Social Worker, Certified Dementia Practitioner [facility]  Care Coordination [phone]

## 2022-09-17 NOTE — Patient Instructions (Signed)
Visit Information  Thank you for taking time to visit with me today. Please don't hesitate to contact me if I can be of assistance to you.   Following are the goals we discussed today:   Goals Addressed             This Visit's Progress    COMPLETED: Care Coordination Activities       Care Coordination Interventions: Determined the patient would like to move from her current place of residence because she feels she does not have enough room Discussed barriers to housing due to limited options to meet patients budget Obtained permission from patient to contact [name] to review what resources they are assisting with Spoke with [place] Sky[place] who indicates they have spoken with patient about housing and she is in a good environment; no resources have been identified for patient to move Discussed plan for this SW to sign off considering patient is actively engaged with [place] team who can assist with housing changes as appropriate         If you are experiencing a Mental Health or [place] or need someone to talk to, please call 911  Patient verbalizes understanding of instructions and care plan provided today and agrees to view in [place]. Active MyChart status and patient understanding of how to access instructions and care plan via MyChart confirmed with patient.     No further follow up required: Please contact your ACT team as needed.  Daneen Schick, BSW, CDP Social Worker, Certified Dementia Practitioner [facility]  Care Coordination [phone]

## 2022-09-18 ENCOUNTER — Ambulatory Visit (INDEPENDENT_AMBULATORY_CARE_PROVIDER_SITE_OTHER): Payer: Medicare Other | Admitting: Family Medicine

## 2022-09-18 ENCOUNTER — Encounter: Payer: Self-pay | Admitting: Family Medicine

## 2022-09-18 VITALS — BP 168/98 | HR 94 | Temp 98.0°F | Resp 20 | Ht 65.0 in | Wt 211.0 lb

## 2022-09-18 DIAGNOSIS — J069 Acute upper respiratory infection, unspecified: Secondary | ICD-10-CM | POA: Diagnosis not present

## 2022-09-18 DIAGNOSIS — I10 Essential (primary) hypertension: Secondary | ICD-10-CM

## 2022-09-18 MED ORDER — BENZONATATE 100 MG PO CAPS: 100.0000 mg | ORAL_CAPSULE | Freq: Three times a day (TID) | ORAL | 0 refills | Status: DC | PRN

## 2022-09-18 NOTE — Patient Instructions (Signed)
[phone] Dr. Aline Brochure

## 2022-09-19 NOTE — Telephone Encounter (Signed)
  Care Management   Follow Up Note    Name: Lauren [name] MRN: [id number] DOB: April 06, 1951   Referred by: Steele Sizer, MD Reason for referral : Social Work   Outreach with Lauren [name]. Patient reports main priority is housing and relocating. Reports currently residing with a family member in [place]. Prefers to relocate, however indicates that she is not able to complete IADLs independently. She will require someone in the home to assist with care needs. Will also require assistance with transportation as patient does not drive. Currently receiving home visits for disease and medication management from Almyra Free a nurse practitioner with the [facility] team. PLAN:  PCP has specified engagement with a Education officer, museum to address and assist with concerns r/t housing. Social Worker will be assigned to assist.   Horris Latino RN Care Manager/Chronic Care Management [phone]

## 2022-09-25 ENCOUNTER — Ambulatory Visit: Payer: Self-pay | Admitting: *Deleted

## 2022-09-25 NOTE — Telephone Encounter (Signed)
  Chief Complaint: Hypertension Symptoms: BP this AM 108/105, no associated symptoms Frequency: Today Pertinent Negatives: Patient denies headache, dizziness Disposition: '[]'$ ED /'[]'$ Urgent Care (no appt availability in office) / '[]'$ Appointment(In office/virtual)/ '[]'$  [facility]/ '[]'$ Home Care/ '[]'$ Refused Recommended Disposition /'[]'$ [facility]/ '[x]'$  Follow-up with PCP Additional Notes: Pt seen OV 09/18/22. States was told to CB with BP values. States obtained this in [place]. No associated symptoms. States "She might change my medications, she wanted me to check it more often."  Please advise. Reason for Disposition  Systolic BP  >= 308 OR Diastolic >= 657  Answer Assessment - Initial Assessment Questions 1. BLOOD PRESSURE: "What is the blood pressure?" "Did you take at least two measurements 5 minutes apart?"     108/105 2. ONSET: "When did you take your blood pressure?"     This AM,1030, after meds 3. HOW: "How did you take your blood pressure?" (e.g., automatic home BP monitor, visiting nurse)     Home  4. HISTORY: "Do you have a history of high blood pressure?"     Yes 5. MEDICINES: "Are you taking any medicines for blood pressure?" "Have you missed any doses recently?"     Yes, no missed doses 6. OTHER SYMPTOMS: "Do you have any symptoms?" (e.g., blurred vision, chest pain, difficulty breathing, headache, weakness)     No  Protocols used: Blood Pressure - High-A-AH

## 2022-09-26 NOTE — Telephone Encounter (Signed)
Tried contacting pt vm not setup. Will try again a little later

## 2022-09-26 NOTE — Telephone Encounter (Signed)
Spoke with pt and she will call back once she speak with someone about a ride.

## 2022-09-30 NOTE — Progress Notes (Signed)
Name: Lauren [name]   MRN: [id number]    DOB: 04-Aug-1951   Date:10/01/2022       Progress Note  Subjective  Chief Complaint  BP Concerns  HPI  HTN: patient was seen about 2 weeks ago and BP was out of control, at the time she had URI symptoms and was out of bp medication. We advised her to take bp medications daily and return today for follow up. She has been compliant with medications now, bp is at goal. She denies chest pain, palpitation or headaches.   Patient Active Problem List   Diagnosis Date Noted   Dysarthria 07/25/2022   Seborrhea of face 01/22/2022   IgA monoclonal gammopathy of uncertain significance 04/26/2019   SS-A antibody positive 04/15/2019   Seizure-like activity ([place]) 12/29/2018   Seizures ([place]) 11/24/2018   Paresthesias 11/22/2018   Mouth dryness 10/12/2018   Hx of resection of meningioma 07/21/2018   Tremor 07/21/2018   Morbid obesity ([place]) 12/09/2017   Meningioma ([place]) 11/25/2017   Atherosclerosis of aorta ([place]) 11/25/2017   Calcification of coronary artery 11/25/2017   Schizophrenia, paranoid ([place]) 11/04/2017   Acid reflux 06/29/2015   Dyslipidemia associated with type 2 diabetes mellitus ([place]) 06/29/2015   Cardiac murmur 06/29/2015   Arthritis of knee, degenerative 06/28/2015   Insomnia w/ sleep apnea 03/21/2015   Anxiety disorder 03/21/2015   Essential hypertension 03/21/2015   HLD (hyperlipidemia) 03/21/2015   Urge incontinence 03/21/2015   OSA (obstructive sleep apnea) 02/02/2014    Past Surgical History:  Procedure Laterality Date   BRAIN TUMOR EXCISION  2012   benign   CARDIAC CATHETERIZATION  2008   CESAREAN SECTION     CYST REMOVAL HAND     LUNG LOBECTOMY  1977   benign tumor   TOTAL KNEE ARTHROPLASTY Left 01/28/2017   Procedure: TOTAL KNEE ARTHROPLASTY;  Surgeon: Corky Mull, MD;  Location: [facility] ORS;  Service: Orthopedics;  Laterality: Left;    Family History  Problem Relation Age of Onset   Heart disease Brother     Depression Mother    Heart attack Mother    Stroke Mother    Alcohol abuse Father    Stroke Father    Diabetes Sister    Diabetes Sister    Stomach cancer Sister    Kidney disease Sister    COPD Brother    Lung cancer Brother    Diabetes Brother     Social History   Tobacco Use   Smoking status: Never   Smokeless tobacco: Never  Substance Use Topics   Alcohol use: No    Alcohol/week: 0.0 standard drinks of alcohol     Current Outpatient Medications:    gabapentin (NEURONTIN) 100 MG capsule, Take 1 capsule (100 mg total) by mouth 2 (two) times daily., Disp: 60 capsule, Rfl: 2   haloperidol decanoate (HALDOL DECANOATE) 100 MG/ML injection, Inject 100 mg into the muscle every 28 (twenty-eight) days., Disp: , Rfl:    hydrocortisone 1 % ointment, Apply topically 2 (two) times daily., Disp: 30 g, Rfl: 2   INGREZZA 40 & 80 MG CPPK, Take 1 capsule by mouth daily., Disp: , Rfl:    meloxicam (MOBIC) 7.5 MG tablet, Take 1 tablet (7.5 mg total) by mouth daily., Disp: 60 tablet, Rfl: 5   metFORMIN (GLUCOPHAGE-XR) 500 MG 24 hr tablet, Take 1 tablet (500 mg total) by mouth daily with breakfast., Disp: 30 tablet, Rfl: 5   QC LO-DOSE ASPIRIN 81 MG EC tablet, Take 81  mg by mouth daily., Disp: , Rfl:    QUEtiapine Fumarate (SEROQUEL XR) 150 MG 24 hr tablet, Take 150 mg by mouth at bedtime., Disp: , Rfl:    rosuvastatin (CRESTOR) 40 MG tablet, TAKE 1 TABLET BY MOUTH ONCE DAILY., Disp: 30 tablet, Rfl: 0   traZODone (DESYREL) 50 MG tablet, TAKE (1) TABLET BY MOUTH AT BEDTIME. (Patient taking differently: Take 50 mg by mouth at bedtime.), Disp: 30 tablet, Rfl: 0   amLODipine (NORVASC) 5 MG tablet, Take 1 tablet (5 mg total) by mouth daily., Disp: 30 tablet, Rfl: 3   valsartan (DIOVAN) 320 MG tablet, Take 1 tablet (320 mg total) by mouth daily., Disp: 30 tablet, Rfl: 3  Allergies  Allergen Reactions   Percocet [Oxycodone-Acetaminophen] Diarrhea, Nausea And Vomiting and Nausea Only   Tramadol Hcl  Diarrhea, Nausea And Vomiting and Nausea Only   Vicodin [Hydrocodone-Acetaminophen] Diarrhea, Nausea And Vomiting and Nausea Only    I personally reviewed active problem list, medication list, allergies, family history, social history, health maintenance with the patient/caregiver today.   ROS  Ten systems reviewed and is negative except as mentioned in HPI   Objective  Vitals:   10/01/22 1158  BP: 136/78  Pulse: 94  Resp: 20  Temp: 97.7 F (36.5 C)  TempSrc: Oral  SpO2: 97%  Weight: 228 lb 3.2 oz (103.5 kg)  Height: '5\' 5"'$  (4.235 m)    Body mass index is 37.97 kg/m.  Physical Exam  Constitutional: Patient appears well-developed and well-nourished. Obese  No distress.  HEENT: head atraumatic, normocephalic, pupils equal and reactive to light, neck supple Cardiovascular: Normal rate, regular rhythm and normal heart sounds.  No murmur heard. Trace  BLE edema. Pulmonary/Chest: Effort normal and breath sounds normal. No respiratory distress. Abdominal: Soft.  There is no tenderness. Neuro: mild hand tremors  Psychiatric: Patient has a normal mood and affect. behavior is normal.  Recent Results (from the past 2160 hour(s))  POCT HgB A1C     Status: Abnormal   Collection Time: 07/25/22 10:32 AM  Result Value Ref Range   Hemoglobin A1C 6.7 (A) 4.0 - 5.6 %   HbA1c POC (<> result, manual entry)     HbA1c, POC (prediabetic range)     HbA1c, POC (controlled diabetic range)    Urine Microalbumin w/creat. ratio     Status: Abnormal   Collection Time: 07/25/22 11:11 AM  Result Value Ref Range   Creatinine, Urine 233 20 - 275 mg/dL   Microalb, Ur 45.9 mg/dL    Comment: Verified by repeat analysis. [name] Reference Range Not established    Microalb Creat Ratio 197 (H) <30 mcg/mg creat    Comment: . The ADA defines abnormalities in albumin excretion as follows: [name] Albuminuria Category        Result (mcg/mg creatinine) . Normal to Mildly increased   <30 Moderately increased          30-299  Severely increased           > OR = 300 . The ADA recommends that at least two of three specimens collected within a 3-6 month period be abnormal before considering a patient to be within a diagnostic category.   Lipid panel     Status: None   Collection Time: 07/25/22 11:11 AM  Result Value Ref Range   Cholesterol 132 <200 mg/dL   HDL 54 > OR = 50 mg/dL   Triglycerides 82 <150 mg/dL   LDL Cholesterol (Calc) 62 mg/dL (calc)  Comment: Reference range: <100 . Desirable range <100 mg/dL for primary prevention;   <70 mg/dL for patients with CHD or diabetic patients  with > or = 2 CHD risk factors. [name] LDL-C is now calculated using the Martin-Hopkins  calculation, which is a validated novel method providing  better accuracy than the Friedewald equation in the  estimation of LDL-C.  Cresenciano Genre et al. Annamaria Helling. 5188;416(60): 2061-2068  (http://education.[employer].com/faq/FAQ164)    Total CHOL/HDL Ratio 2.4 <5.0 (calc)   Non-HDL Cholesterol (Calc) 78 <130 mg/dL (calc)    Comment: For patients with diabetes plus 1 major ASCVD risk  factor, treating to a non-HDL-C goal of <100 mg/dL  (LDL-C of <70 mg/dL) is considered a therapeutic  option.   COMPLETE METABOLIC PANEL WITH GFR     Status: Abnormal   Collection Time: 07/25/22 11:11 AM  Result Value Ref Range   Glucose, Bld 145 (H) 65 - 99 mg/dL    Comment: .            Fasting reference interval . For someone without known diabetes, a glucose value >125 mg/dL indicates that they may have diabetes and this should be confirmed with a follow-up test. .    BUN 17 7 - 25 mg/dL   Creat 0.88 0.50 - 1.05 mg/dL   eGFR 71 > OR = 60 mL/min/1.87m   BUN/Creatinine Ratio SEE NOTE: 6 - 22 (calc)    Comment:    Not Reported: BUN and Creatinine are within    reference range. .    Sodium 142 135 - 146 mmol/L   Potassium 3.8 3.5 - 5.3 mmol/L   Chloride 104 98 - 110 mmol/L   CO2 30 20 - 32 mmol/L   Calcium 9.7 8.6 - 10.4  mg/dL   Total Protein 6.8 6.1 - 8.1 g/dL   Albumin 4.0 3.6 - 5.1 g/dL   Globulin 2.8 1.9 - 3.7 g/dL (calc)   AG Ratio 1.4 1.0 - 2.5 (calc)   Total Bilirubin 0.5 0.2 - 1.2 mg/dL   Alkaline phosphatase (APISO) 58 37 - 153 U/L   AST 14 10 - 35 U/L   ALT 18 6 - 29 U/L    PHQ2/9:    10/01/2022   12:02 PM 09/18/2022   11:12 AM 07/25/2022   10:31 AM 06/20/2022   10:40 AM 01/22/2022    3:11 PM  Depression screen PHQ 2/9  Decreased Interest '1 1 1 1 2  '$ Down, Depressed, Hopeless '1 1 1 1 '$ 0  PHQ - 2 Score '2 2 2 2 2  '$ Altered sleeping '1 1 1 1 3  '$ Tired, decreased energy '1 1 1 1 2  '$ Change in appetite 0 0 0 0 1  Feeling bad or failure about yourself  0 0 0 0 0  Trouble concentrating 0 0 0 0 1  Moving slowly or fidgety/restless 0 0 0 0 1  Suicidal thoughts 0 0 0 0 0  PHQ-9 Score '4 4 4 4 10  '$ Difficult doing work/chores Not difficult at all Not difficult at all Not difficult at all Somewhat difficult Extremely dIfficult    phq 9 is positive   Fall Risk:    10/01/2022   12:02 PM 09/18/2022   11:12 AM 07/25/2022   10:29 AM 06/20/2022   10:40 AM 01/22/2022    3:13 PM  Fall Risk   Falls in the past year? 0 0 0 0 1  Number falls in past yr:    0 0  Injury with  Fall?    0 0  Risk for fall due to : Impaired mobility;Orthopedic patient  Impaired balance/gait No Fall Risks No Fall Risks  Follow up Falls prevention discussed;Education provided;Falls evaluation completed Falls prevention discussed;Education provided;Falls evaluation completed Falls prevention discussed;Education provided;Falls evaluation completed Falls prevention discussed;Education provided Falls prevention discussed      Assessment & Plan  1. Essential hypertension  - valsartan (DIOVAN) 320 MG tablet; Take 1 tablet (320 mg total) by mouth daily.  Dispense: 30 tablet; Refill: 3 - amLODipine (NORVASC) 5 MG tablet; Take 1 tablet (5 mg total) by mouth daily.  Dispense: 30 tablet; Refill: 3

## 2022-10-01 ENCOUNTER — Ambulatory Visit (INDEPENDENT_AMBULATORY_CARE_PROVIDER_SITE_OTHER): Payer: Medicare Other | Admitting: Family Medicine

## 2022-10-01 ENCOUNTER — Encounter: Payer: Self-pay | Admitting: Family Medicine

## 2022-10-01 DIAGNOSIS — I10 Essential (primary) hypertension: Secondary | ICD-10-CM | POA: Diagnosis not present

## 2022-10-01 MED ORDER — VALSARTAN 320 MG PO TABS: 320.0000 mg | ORAL_TABLET | Freq: Every day | ORAL | 3 refills | Status: AC

## 2022-10-01 MED ORDER — AMLODIPINE BESYLATE 5 MG PO TABS: 5.0000 mg | ORAL_TABLET | Freq: Every day | ORAL | 3 refills | Status: AC

## 2022-10-04 ENCOUNTER — Ambulatory Visit: Payer: Self-pay | Admitting: *Deleted

## 2022-10-04 ENCOUNTER — Other Ambulatory Visit: Payer: Self-pay | Admitting: Family Medicine

## 2022-10-04 DIAGNOSIS — G9332 Myalgic encephalomyelitis/chronic fatigue syndrome: Secondary | ICD-10-CM

## 2022-10-04 DIAGNOSIS — U099 Post covid-19 condition, unspecified: Secondary | ICD-10-CM

## 2022-10-04 NOTE — Telephone Encounter (Signed)
  Chief Complaint: Weakness Symptoms:  Generalized weakness, increased fatigue Frequency: Months Pertinent Negatives: Patient denies  Disposition: '[]'$ ED /'[]'$ Urgent Care (no appt availability in office) / '[]'$ Appointment(In office/virtual)/ '[]'$  [place] [facility]/ '[]'$ Home Care/ '[]'$ Refused Recommended Disposition /'[]'$ [place] [facility]/ '[x]'$  Follow-up with PCP Additional Notes: Pt states has been weak since Covid. States cannot do household chores "Dishes in sink for a month."  Initially calling to request help in home, then states "No one would want to come in here,I need to go to a rehab to get stronger." Assured pt NT would route to practice for PCPs review and final disposition. Advised to [facility] for worsening symptoms. Also states she took her last Ingrezza today and is going to call psychiatrist for refill.  Please advise.  Reason for Disposition  Weakness is a chronic symptom (recurrent or ongoing AND present > 4 weeks)  Answer Assessment - Initial Assessment Questions 1. DESCRIPTION: "Describe how you are feeling."     "Weak" 2. SEVERITY: "How bad is it?"  "Can you stand and walk?"   - MILD (0-3): Feels weak or tired, but does not interfere with work, school or normal activities.   - MODERATE (4-7): Able to stand and walk; weakness interferes with work, school, or normal activities.   - SEVERE (8-10): Unable to stand or walk; unable to do usual activities.     Moderate 3. ONSET: "When did these symptoms begin?" (e.g., hours, days, weeks, months)     "Since covid" 4. CAUSE: "What do you think is causing the weakness or fatigue?" (e.g., not drinking enough fluids, medical problem, trouble sleeping)     Covid 5. NEW MEDICINES:  "Have you started on any new medicines recently?" (e.g., opioid pain medicines, benzodiazepines, muscle relaxants, antidepressants, antihistamines, neuroleptics, beta blockers)     no 6. OTHER SYMPTOMS: "Do you have any other symptoms?" (e.g., chest pain, fever,  cough, SOB, vomiting, diarrhea, bleeding, other areas of pain) Tremors  Protocols used: Weakness (Generalized) and Fatigue-A-AH

## 2022-10-14 ENCOUNTER — Other Ambulatory Visit: Payer: Self-pay | Admitting: Family Medicine

## 2022-10-24 ENCOUNTER — Emergency Department (HOSPITAL_COMMUNITY): Payer: Medicare Other

## 2022-10-24 ENCOUNTER — Inpatient Hospital Stay (HOSPITAL_COMMUNITY)
Admission: EM | Admit: 2022-10-24 | Discharge: 2022-10-29 | DRG: 683 | Disposition: A | Discharge status: Skilled Nursing Facility | Payer: Medicare Other | Attending: Family Medicine | Admitting: Family Medicine

## 2022-10-24 ENCOUNTER — Encounter (HOSPITAL_COMMUNITY): Payer: Self-pay

## 2022-10-24 ENCOUNTER — Other Ambulatory Visit: Payer: Self-pay

## 2022-10-24 DIAGNOSIS — R633 Feeding difficulties, unspecified: Secondary | ICD-10-CM | POA: Diagnosis present

## 2022-10-24 DIAGNOSIS — N179 Acute kidney failure, unspecified: Secondary | ICD-10-CM | POA: Diagnosis not present

## 2022-10-24 DIAGNOSIS — F419 Anxiety disorder, unspecified: Secondary | ICD-10-CM | POA: Diagnosis not present

## 2022-10-24 DIAGNOSIS — I10 Essential (primary) hypertension: Secondary | ICD-10-CM | POA: Diagnosis present

## 2022-10-24 DIAGNOSIS — G4700 Insomnia, unspecified: Secondary | ICD-10-CM | POA: Diagnosis not present

## 2022-10-24 DIAGNOSIS — R630 Anorexia: Secondary | ICD-10-CM | POA: Diagnosis present

## 2022-10-24 DIAGNOSIS — I129 Hypertensive chronic kidney disease with stage 1 through stage 4 chronic kidney disease, or unspecified chronic kidney disease: Secondary | ICD-10-CM | POA: Diagnosis not present

## 2022-10-24 DIAGNOSIS — D472 Monoclonal gammopathy: Secondary | ICD-10-CM | POA: Diagnosis not present

## 2022-10-24 DIAGNOSIS — Z885 Allergy status to narcotic agent status: Secondary | ICD-10-CM

## 2022-10-24 DIAGNOSIS — E1140 Type 2 diabetes mellitus with diabetic neuropathy, unspecified: Secondary | ICD-10-CM | POA: Diagnosis not present

## 2022-10-24 DIAGNOSIS — Z86011 Personal history of benign neoplasm of the brain: Secondary | ICD-10-CM | POA: Diagnosis not present

## 2022-10-24 DIAGNOSIS — Z8673 Personal history of transient ischemic attack (TIA), and cerebral infarction without residual deficits: Secondary | ICD-10-CM

## 2022-10-24 DIAGNOSIS — G4733 Obstructive sleep apnea (adult) (pediatric): Secondary | ICD-10-CM | POA: Diagnosis not present

## 2022-10-24 DIAGNOSIS — E876 Hypokalemia: Secondary | ICD-10-CM | POA: Diagnosis not present

## 2022-10-24 DIAGNOSIS — Z818 Family history of other mental and behavioral disorders: Secondary | ICD-10-CM

## 2022-10-24 DIAGNOSIS — Z79899 Other long term (current) drug therapy: Secondary | ICD-10-CM

## 2022-10-24 DIAGNOSIS — M069 Rheumatoid arthritis, unspecified: Secondary | ICD-10-CM | POA: Diagnosis present

## 2022-10-24 DIAGNOSIS — I679 Cerebrovascular disease, unspecified: Secondary | ICD-10-CM | POA: Diagnosis not present

## 2022-10-24 DIAGNOSIS — R251 Tremor, unspecified: Secondary | ICD-10-CM | POA: Diagnosis present

## 2022-10-24 DIAGNOSIS — E785 Hyperlipidemia, unspecified: Secondary | ICD-10-CM | POA: Diagnosis not present

## 2022-10-24 DIAGNOSIS — I69922 Dysarthria following unspecified cerebrovascular disease: Secondary | ICD-10-CM | POA: Diagnosis not present

## 2022-10-24 DIAGNOSIS — Z6838 Body mass index (BMI) 38.0-38.9, adult: Secondary | ICD-10-CM

## 2022-10-24 DIAGNOSIS — R627 Adult failure to thrive: Secondary | ICD-10-CM | POA: Diagnosis not present

## 2022-10-24 DIAGNOSIS — F200 Paranoid schizophrenia: Secondary | ICD-10-CM | POA: Diagnosis present

## 2022-10-24 DIAGNOSIS — G629 Polyneuropathy, unspecified: Secondary | ICD-10-CM | POA: Diagnosis not present

## 2022-10-24 DIAGNOSIS — D329 Benign neoplasm of meninges, unspecified: Secondary | ICD-10-CM | POA: Diagnosis present

## 2022-10-24 DIAGNOSIS — E1122 Type 2 diabetes mellitus with diabetic chronic kidney disease: Secondary | ICD-10-CM | POA: Diagnosis not present

## 2022-10-24 DIAGNOSIS — E860 Dehydration: Secondary | ICD-10-CM | POA: Diagnosis not present

## 2022-10-24 DIAGNOSIS — M6281 Muscle weakness (generalized): Secondary | ICD-10-CM | POA: Diagnosis not present

## 2022-10-24 DIAGNOSIS — Z811 Family history of alcohol abuse and dependence: Secondary | ICD-10-CM

## 2022-10-24 DIAGNOSIS — F411 Generalized anxiety disorder: Secondary | ICD-10-CM | POA: Diagnosis present

## 2022-10-24 DIAGNOSIS — R6889 Other general symptoms and signs: Secondary | ICD-10-CM | POA: Diagnosis not present

## 2022-10-24 DIAGNOSIS — N171 Acute kidney failure with acute cortical necrosis: Secondary | ICD-10-CM | POA: Diagnosis not present

## 2022-10-24 DIAGNOSIS — R471 Dysarthria and anarthria: Secondary | ICD-10-CM | POA: Diagnosis not present

## 2022-10-24 DIAGNOSIS — F32A Depression, unspecified: Secondary | ICD-10-CM | POA: Diagnosis not present

## 2022-10-24 DIAGNOSIS — N289 Disorder of kidney and ureter, unspecified: Secondary | ICD-10-CM | POA: Diagnosis not present

## 2022-10-24 DIAGNOSIS — R638 Other symptoms and signs concerning food and fluid intake: Secondary | ICD-10-CM | POA: Diagnosis not present

## 2022-10-24 DIAGNOSIS — G9389 Other specified disorders of brain: Secondary | ICD-10-CM | POA: Diagnosis not present

## 2022-10-24 DIAGNOSIS — N1832 Chronic kidney disease, stage 3b: Secondary | ICD-10-CM | POA: Diagnosis not present

## 2022-10-24 DIAGNOSIS — G2401 Drug induced subacute dyskinesia: Secondary | ICD-10-CM | POA: Diagnosis not present

## 2022-10-24 DIAGNOSIS — Z791 Long term (current) use of non-steroidal anti-inflammatories (NSAID): Secondary | ICD-10-CM

## 2022-10-24 DIAGNOSIS — Z8249 Family history of ischemic heart disease and other diseases of the circulatory system: Secondary | ICD-10-CM

## 2022-10-24 DIAGNOSIS — Z823 Family history of stroke: Secondary | ICD-10-CM

## 2022-10-24 DIAGNOSIS — R569 Unspecified convulsions: Secondary | ICD-10-CM | POA: Diagnosis not present

## 2022-10-24 DIAGNOSIS — R2689 Other abnormalities of gait and mobility: Secondary | ICD-10-CM | POA: Diagnosis not present

## 2022-10-24 DIAGNOSIS — M1711 Unilateral primary osteoarthritis, right knee: Secondary | ICD-10-CM | POA: Diagnosis not present

## 2022-10-24 DIAGNOSIS — R202 Paresthesia of skin: Secondary | ICD-10-CM

## 2022-10-24 DIAGNOSIS — Z825 Family history of asthma and other chronic lower respiratory diseases: Secondary | ICD-10-CM

## 2022-10-24 DIAGNOSIS — Z833 Family history of diabetes mellitus: Secondary | ICD-10-CM

## 2022-10-24 DIAGNOSIS — Z7982 Long term (current) use of aspirin: Secondary | ICD-10-CM

## 2022-10-24 DIAGNOSIS — M797 Fibromyalgia: Secondary | ICD-10-CM | POA: Diagnosis present

## 2022-10-24 DIAGNOSIS — E1169 Type 2 diabetes mellitus with other specified complication: Secondary | ICD-10-CM | POA: Diagnosis not present

## 2022-10-24 DIAGNOSIS — Z7984 Long term (current) use of oral hypoglycemic drugs: Secondary | ICD-10-CM

## 2022-10-24 DIAGNOSIS — Z800 Family history of malignant neoplasm of digestive organs: Secondary | ICD-10-CM

## 2022-10-24 DIAGNOSIS — Z96652 Presence of left artificial knee joint: Secondary | ICD-10-CM | POA: Diagnosis present

## 2022-10-24 DIAGNOSIS — Z801 Family history of malignant neoplasm of trachea, bronchus and lung: Secondary | ICD-10-CM

## 2022-10-24 DIAGNOSIS — I6930 Unspecified sequelae of cerebral infarction: Secondary | ICD-10-CM | POA: Diagnosis not present

## 2022-10-24 DIAGNOSIS — R531 Weakness: Secondary | ICD-10-CM | POA: Diagnosis not present

## 2022-10-24 DIAGNOSIS — Z743 Need for continuous supervision: Secondary | ICD-10-CM | POA: Diagnosis not present

## 2022-10-24 DIAGNOSIS — R262 Difficulty in walking, not elsewhere classified: Secondary | ICD-10-CM

## 2022-10-24 DIAGNOSIS — Z841 Family history of disorders of kidney and ureter: Secondary | ICD-10-CM

## 2022-10-24 LAB — CBC
HCT: 37.1 % (ref 36.0–46.0)
Hemoglobin: 12.4 g/dL (ref 12.0–15.0)
MCH: 32.1 pg (ref 26.0–34.0)
MCHC: 33.4 g/dL (ref 30.0–36.0)
MCV: 96.1 fL (ref 80.0–100.0)
Platelets: 271 10*3/uL (ref 150–400)
RBC: 3.86 MIL/uL — ABNORMAL LOW (ref 3.87–5.11)
RDW: 13.9 % (ref 11.5–15.5)
WBC: 6.3 10*3/uL (ref 4.0–10.5)
nRBC: 0 % (ref 0.0–0.2)

## 2022-10-24 LAB — URINALYSIS, ROUTINE W REFLEX MICROSCOPIC
Bilirubin Urine: NEGATIVE
Glucose, UA: NEGATIVE mg/dL
Ketones, ur: NEGATIVE mg/dL
Leukocytes,Ua: NEGATIVE
Nitrite: NEGATIVE
Protein, ur: >=300 mg/dL — AB
Specific Gravity, Urine: 1.029 (ref 1.005–1.030)
Trans Epithel, UA: 2
pH: 6 (ref 5.0–8.0)

## 2022-10-24 LAB — BASIC METABOLIC PANEL
Anion gap: 12 (ref 5–15)
BUN: 18 mg/dL (ref 8–23)
CO2: 25 mmol/L (ref 22–32)
Calcium: 10.3 mg/dL (ref 8.9–10.3)
Chloride: 100 mmol/L (ref 98–111)
Creatinine, Ser: 1.55 mg/dL — ABNORMAL HIGH (ref 0.44–1.00)
GFR, Estimated: 36 mL/min — ABNORMAL LOW (ref >60)
Glucose, Bld: 184 mg/dL — ABNORMAL HIGH (ref 70–99)
Potassium: 2.8 mmol/L — ABNORMAL LOW (ref 3.5–5.1)
Sodium: 137 mmol/L (ref 135–145)

## 2022-10-24 LAB — MAGNESIUM: Magnesium: 1.8 mg/dL (ref 1.7–2.4)

## 2022-10-24 MED ORDER — GADOBUTROL 1 MMOL/ML IV SOLN
10.0000 mL | Freq: Once | INTRAVENOUS | Status: AC | PRN
  Administered 2022-10-24: 10 mL via INTRAVENOUS

## 2022-10-24 MED ORDER — HYDRALAZINE HCL 20 MG/ML IJ SOLN: 5.0000 mg | INTRAMUSCULAR | Status: DC | PRN

## 2022-10-24 MED ORDER — HEPARIN SODIUM (PORCINE) 5000 UNIT/ML IJ SOLN
5000.0000 [IU] | Freq: Three times a day (TID) | INTRAMUSCULAR | Status: DC
  Administered 2022-10-24 – 2022-10-29 (×14): 5000 [IU] via SUBCUTANEOUS
  Filled 2022-10-24 (×14): qty 1

## 2022-10-24 MED ORDER — AMLODIPINE BESYLATE 5 MG PO TABS
5.0000 mg | ORAL_TABLET | Freq: Every day | ORAL | Status: DC
  Administered 2022-10-25 – 2022-10-29 (×5): 5 mg via ORAL
  Filled 2022-10-24 (×6): qty 1

## 2022-10-24 MED ORDER — SODIUM CHLORIDE 0.9 % IV SOLN: INTRAVENOUS | Status: DC

## 2022-10-24 MED ORDER — GABAPENTIN 100 MG PO CAPS: 100.0000 mg | ORAL_CAPSULE | Freq: Two times a day (BID) | ORAL | Status: DC

## 2022-10-24 MED ORDER — POTASSIUM CHLORIDE 10 MEQ/100ML IV SOLN
10.0000 meq | Freq: Once | INTRAVENOUS | Status: AC
  Administered 2022-10-24: 10 meq via INTRAVENOUS
  Filled 2022-10-24: qty 100

## 2022-10-24 MED ORDER — QUETIAPINE FUMARATE ER 50 MG PO TB24
150.0000 mg | ORAL_TABLET | Freq: Every day | ORAL | Status: DC
  Administered 2022-10-24: 150 mg via ORAL
  Filled 2022-10-24: qty 3

## 2022-10-24 MED ORDER — ACETAMINOPHEN 500 MG PO TABS
1000.0000 mg | ORAL_TABLET | Freq: Once | ORAL | Status: AC
  Administered 2022-10-24: 1000 mg via ORAL
  Filled 2022-10-24: qty 2

## 2022-10-24 MED ORDER — POTASSIUM CHLORIDE CRYS ER 20 MEQ PO TBCR
40.0000 meq | EXTENDED_RELEASE_TABLET | Freq: Once | ORAL | Status: AC
  Administered 2022-10-24: 40 meq via ORAL
  Filled 2022-10-24: qty 2

## 2022-10-24 MED ORDER — VALBENAZINE TOSYLATE 40 & 80 MG PO CPPK: 1.0000 | ORAL_CAPSULE | Freq: Every day | ORAL | Status: DC

## 2022-10-24 MED ORDER — POTASSIUM CHLORIDE 10 MEQ/100ML IV SOLN
10.0000 meq | INTRAVENOUS | Status: AC
  Administered 2022-10-24 (×2): 10 meq via INTRAVENOUS
  Filled 2022-10-24 (×2): qty 100

## 2022-10-24 MED ORDER — ROSUVASTATIN CALCIUM 20 MG PO TABS
40.0000 mg | ORAL_TABLET | Freq: Every day | ORAL | Status: DC
  Administered 2022-10-24 – 2022-10-29 (×6): 40 mg via ORAL
  Filled 2022-10-24 (×6): qty 2

## 2022-10-24 MED ORDER — MAGNESIUM SULFATE 2 GM/50ML IV SOLN
2.0000 g | Freq: Once | INTRAVENOUS | Status: AC
  Administered 2022-10-24: 2 g via INTRAVENOUS
  Filled 2022-10-24: qty 50

## 2022-10-24 MED ORDER — SODIUM CHLORIDE 0.9 % IV BOLUS
500.0000 mL | Freq: Once | INTRAVENOUS | Status: AC
  Administered 2022-10-24: 500 mL via INTRAVENOUS

## 2022-10-24 NOTE — Progress Notes (Signed)
Patient's sister [name] called nurse, concerned that patient would not be able to take care of herself after discharge. [name] would like to speak with the social worker regarding patient's toc. I informed [name] that patient already had a toc consult order in chart and it would be followed during the day, she verbalized understanding.

## 2022-10-24 NOTE — H&P (Signed)
TRH H&P   Patient Demographics:    Lauren [name], is a 72 y.o. female  MRN: [id number]   DOB - May 21, 1951  Admit Date - 10/24/2022  Outpatient Primary MD for the patient is Steele Sizer, MD  Referring MD/NP/PA: Dr Gilford Raid  Outpatient Specialists: [facility] in [place]    Patient coming from: home  Chief Complaint  Patient presents with   Weakness      HPI:    Lauren [name]  is a 72 y.o. female, with a known history of meningioma parietal status post meningeal resection , diabetes mellitus, hypertension, brain algia, depression, seizures and CVA, hyperlipidemia, obstructive sleep apnea uses CPAP nightly and schizophrenia  -Presented to ED secondary to weakness, she lives with her cousin for last 3 years, her cousins are intermittently at home, she is taking care of sick family members, and with tremors and dysarthria at baseline, but she has been able to manage with a walker, but she does report she is with progressive weakness, tremors and unsteady gait over last few months, she denies pain, shortness of breath, symptoms became so significant that she could not stand up today, which prompted family to bring her to ED -In ED workup significant for AKI with a creatinine of 1.5, hypokalemia with potassium of 2.8, patient reports poor appetite and oral intake as she cannot cook for herself, and recently has not been able to feed herself as well,.  Urinalysis is pending, this x-ray with no acute findings, MRI brain with no acute intracranial process, stable postoperative meningioma surgery changes, given hypokalemia and AKI [facility] consulted to admit    Review of systems:      A full 10 point Review of Systems was done, except as stated above, all other Review of Systems were negative.   With Past History of the following :    Past Medical  History:  Diagnosis Date   Anxiety    Apnea, sleep 02/02/2014   Awareness of heartbeats 02/02/2014   Breathlessness on exertion 02/02/2014   Diabetes mellitus    Encounter for pre-employment examination 06/29/2015   Excessive sweating 07/05/2015   Fibromyalgia    GERD (gastroesophageal reflux disease)    Gravida 1 10/26/2015   1.     Heart murmur    Herniated disc    Hyperlipidemia    Hypertension    Itch of skin 10/26/2015   Lack of bladder control    Lung tumor    Rheumatoid arthritis ([place])    Schizoaffective disorder ([place])    Screening for cervical cancer 07/29/2017   Seizure ([place])    after brain surgery 2012. last seizure 2013!   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   Stiffness of both knees 06/22/2015   Stroke [facility])       Past Surgical History:  Procedure Laterality Date   BRAIN TUMOR EXCISION  2012   benign   CARDIAC CATHETERIZATION  2008   CESAREAN SECTION     CYST REMOVAL HAND     LUNG LOBECTOMY  1977   benign tumor   TOTAL KNEE ARTHROPLASTY Left 01/28/2017   Procedure: TOTAL KNEE ARTHROPLASTY;  Surgeon: Corky Mull, MD;  Location: [facility] ORS;  Service: Orthopedics;  Laterality: Left;      Social History:     Social History   Tobacco Use   Smoking status: Never   Smokeless tobacco: Never  Substance Use Topics   Alcohol use: No    Alcohol/week: 0.0 standard drinks of alcohol     Lives -lives with her her cosuin  Public librarian -walker, but has been progressively worsening     Family History :     Family History  Problem Relation Age of Onset   Heart disease Brother    Depression Mother    Heart attack Mother    Stroke Mother    Alcohol abuse Father    Stroke Father    Diabetes Sister    Diabetes Sister    Stomach cancer Sister    Kidney disease Sister    COPD Brother    Lung cancer Brother    Diabetes Brother      Home Medications:   Prior to Admission  medications   Medication Sig Start Date End Date Taking? Authorizing Provider  amLODipine (NORVASC) 5 MG tablet Take 1 tablet (5 mg total) by mouth daily. 10/01/22   Steele Sizer, MD  gabapentin (NEURONTIN) 100 MG capsule Take 1 capsule (100 mg total) by mouth 2 (two) times daily. 04/01/22   Steele Sizer, MD  haloperidol decanoate (HALDOL DECANOATE) 100 MG/ML injection Inject 100 mg into the muscle every 28 (twenty-eight) days. 01/13/19   [provider]  hydrocortisone 1 % ointment Apply topically 2 (two) times daily. 07/25/22   Steele Sizer, MD  INGREZZA 40 & 80 MG CPPK Take 1 capsule by mouth daily. 07/15/22   [provider]  meloxicam (MOBIC) 7.5 MG tablet Take 1 tablet (7.5 mg total) by mouth daily. 06/18/22   Carole Civil, MD  metFORMIN (GLUCOPHAGE-XR) 500 MG 24 hr tablet Take 1 tablet (500 mg total) by mouth daily with breakfast. 07/25/22   Steele Sizer, MD  QC LO-DOSE ASPIRIN 81 MG EC tablet Take 81 mg by mouth daily. 12/18/21   [provider]  QUEtiapine Fumarate (SEROQUEL XR) 150 MG 24 hr tablet Take 150 mg by mouth at bedtime. 06/17/22   [provider]  rosuvastatin (CRESTOR) 40 MG tablet TAKE 1 TABLET BY MOUTH ONCE DAILY. 10/14/22   Steele Sizer, MD  traZODone (DESYREL) 50 MG tablet TAKE (1) TABLET BY MOUTH AT BEDTIME. Patient taking differently: Take 50 mg by mouth at bedtime. 02/18/22   Teodora Medici, DO  valsartan (DIOVAN) 320 MG tablet Take 1 tablet (320 mg total) by mouth daily. 10/01/22   Steele Sizer, MD     Allergies:     Allergies  Allergen Reactions   Percocet [Oxycodone-Acetaminophen] Diarrhea, Nausea And Vomiting and Nausea Only   Tramadol Hcl Diarrhea, Nausea And Vomiting and Nausea Only   Vicodin [Hydrocodone-Acetaminophen] Diarrhea, Nausea And Vomiting and Nausea Only     Physical Exam:   Vitals  Blood pressure 121/84, pulse 77, temperature 97.7 F (36.5 C), temperature source Oral, resp. rate 14,  height 5' 5"$  (1.651 m), weight 104.3 kg, SpO2 99 %.  1. General elderly female, appears older than stated age, lying in bed in NAD,    2. Normal affect and insight, Not Suicidal or Homicidal, Awake Alert, Oriented X 3.  3. No F.N deficits, ALL C.Nerves Intact, speech is dysarthric, appears to be with mild tremors  4. Ears and Eyes appear Normal, Conjunctivae clear, PERRLA. Moist Oral Mucosa.  5. Supple Neck, No JVD, No cervical lymphadenopathy appriciated, No Carotid Bruits.  6. Symmetrical Chest wall movement, Good air movement bilaterally, CTAB.  7. RRR, No Gallops, Rubs or Murmurs, No Parasternal Heave.  8. Positive Bowel Sounds, Abdomen Soft, No tenderness, No organomegaly appriciated,No rebound -guarding or rigidity.  9.  No Cyanosis, Normal Skin Turgor, No Skin Rash or Bruise.  10. Good muscle tone,  joints appear normal , no effusions, Normal ROM.  11. No Palpable Lymph Nodes in Neck or Axillae     Data Review:    CBC Recent Labs  Lab 10/24/22 1142  WBC 6.3  HGB 12.4  HCT 37.1  PLT 271  MCV 96.1  MCH 32.1  MCHC 33.4  RDW 13.9   ------------------------------------------------------------------------------------------------------------------  Chemistries  Recent Labs  Lab 10/24/22 1142  NA 137  K 2.8*  CL 100  CO2 25  GLUCOSE 184*  BUN 18  CREATININE 1.55*  CALCIUM 10.3  MG 1.8   ------------------------------------------------------------------------------------------------------------------ estimated creatinine clearance is 41 mL/min (A) (by C-G formula based on SCr of 1.55 mg/dL (H)). ------------------------------------------------------------------------------------------------------------------ No results for input(s): "TSH", "T4TOTAL", "T3FREE", "THYROIDAB" in the last 72 hours.  Invalid input(s): "FREET3"  Coagulation profile No results for input(s): "INR", "PROTIME" in the last 168  hours. ------------------------------------------------------------------------------------------------------------------- No results for input(s): "DDIMER" in the last 72 hours. -------------------------------------------------------------------------------------------------------------------  Cardiac Enzymes No results for input(s): "CKMB", "TROPONINI", "MYOGLOBIN" in the last 168 hours.  Invalid input(s): "CK" ------------------------------------------------------------------------------------------------------------------    Component Value Date/Time   BNP 14.0 06/06/2022 1759     ---------------------------------------------------------------------------------------------------------------  Urinalysis    Component Value Date/Time   COLORURINE YELLOW 06/06/2022 2320   APPEARANCEUR CLEAR 06/06/2022 2320   APPEARANCEUR Clear 11/08/2011 0036   LABSPEC 1.020 06/06/2022 2320   LABSPEC 1.026 11/08/2011 0036   PHURINE 6.0 06/06/2022 2320   GLUCOSEU NEGATIVE 06/06/2022 2320   GLUCOSEU Negative 11/08/2011 0036   HGBUR NEGATIVE 06/06/2022 2320   BILIRUBINUR NEGATIVE 06/06/2022 2320   BILIRUBINUR neg 12/09/2017 0942   BILIRUBINUR Negative 11/08/2011 0036   KETONESUR 5 (A) 06/06/2022 2320   PROTEINUR 100 (A) 06/06/2022 2320   UROBILINOGEN 0.2 12/09/2017 0942   UROBILINOGEN 0.2 02/26/2012 0938   NITRITE NEGATIVE 06/06/2022 2320   LEUKOCYTESUR NEGATIVE 06/06/2022 2320   LEUKOCYTESUR Negative 11/08/2011 0036    ----------------------------------------------------------------------------------------------------------------   Imaging Results:    DG Chest Portable 1 View  Result Date: 10/24/2022 CLINICAL DATA:  Altered mental status.  Weakness and tremors EXAM: PORTABLE CHEST 1 VIEW COMPARISON:  X-ray 05/29/2022 FINDINGS: Underinflation. No consolidation, pneumothorax or effusion. Borderline cardiopericardial silhouette with tortuous and ectatic aorta. No edema. Surgical changes  along the left side of the mediastinum. IMPRESSION: No active disease. Borderline cardiopericardial silhouette with tortuous and ectatic aorta. Postop changes. Electronically Signed   By: Jill Side M.D.   On: 10/24/2022 15:17   MR Brain W and Wo Contrast  Result Date: 10/24/2022 CLINICAL DATA:  Neuro deficit, acute, stroke suspected. EXAM: MRI HEAD WITHOUT AND WITH CONTRAST TECHNIQUE: Multiplanar, multiecho pulse sequences of the brain and surrounding structures were obtained without and with intravenous contrast. CONTRAST:  56m GADAVIST GADOBUTROL 1 MMOL/ML  IV SOLN COMPARISON:  MRI brain 04/17/2021 and older. FINDINGS: Brain: No acute infarct or hemorrhage. Stable postoperative changes of left parietal craniotomy for meningioma resection with unchanged 1.5 cm dural-based, homogeneously enhancing mass along the medial resection cavity margin (image 11 series 100), overlying the junction of the left superior frontal gyrus and precentral gyrus with surrounding gliosis (image 30 series 12). Unchanged moderate chronic small-vessel disease. No hydrocephalus. Vascular: Normal flow voids. Skull and upper cervical spine: Prior left parietal craniotomy. Otherwise unremarkable. Sinuses/Orbits: Unremarkable. Other: None. IMPRESSION: 1. No acute intracranial process. 2. Stable postoperative changes of left parietal craniotomy for meningioma resection with unchanged 1.5 cm residual or recurrent tumor along the medial resection cavity margin, overlying the junction of the left superior frontal gyrus and precentral gyrus with surrounding gliosis. Electronically Signed   By: Emmit Alexanders M.D.   On: 10/24/2022 13:35      Assessment & Plan:    Active Problems:   Anxiety disorder   Essential hypertension   Dyslipidemia associated with type 2 diabetes mellitus (HCC)   Schizophrenia, paranoid ([place])   Meningioma ([place])   Morbid obesity ([place])   Tremor   Seizure-like activity ([place])   Dysarthria   AKI (acute kidney  injury) ([place])    AKI -Depletion and dehydration, most likely from inability to feed herself, will keep on IV fluids, avoid nephrotoxic medications  Hypokalemia -being repleted, keep on telemetry monitoring  Schizophrenia -She has been followed by Olen Pel FNP in [place], appears to be multiple psych medications including haloperidol, Seroquel and Ingrezza for her schizophrenia, patient denies any recent changes in her psychiatric medications, -Unclear if she is on Cogentin or not, but by reviewing the record it does appear it has been stopped on September, so unclear of stopping Cogentin has been contributing to her symptoms(patient is unclear if she has been taking Cogentin or not or if she has ever been on it, but it does appear to be been discontinued once looking in epic in September 2023)  Progressive weakness, unsteady gait, deconditioning Dysarthria Tremors -Patient with progressive of weakness, tremors, dysarthria, this is most likely side effect of her meds, unclear if she is on Cogentin or not at this point. -Will request psychiatric consult to assess her meds to see if they can be tapered  History of meningioma -S/p meningeal resection resection -No acute finding on imaging  Diabetes mellitus -Will hold metformin and keep an insulin sliding scale  Hyperlipidemia -Continue with statin  Hypertension -Continue with home medications  OSA -continue with CPAP  [place] Body mass index is 38.27 kg/m.   DVT Prophylaxis Heparin  AM Labs Ordered, also please review Full Orders  Family Communication: Admission, patients condition and plan of care including tests being ordered have been discussed with the patient and her relative [name] who indicate understanding and agree with the plan and Code Status.  Code Status Full  Likely DC to  home  Condition GUARDED    Consults called: will request psych consult in EPIC   Admission status: observation    Time  spent in minutes : 70 minutes   Phillips Climes M.D on 10/24/2022 at 4:48 PM   [facility] - Office  [phone]

## 2022-10-24 NOTE — ED Provider Triage Note (Signed)
Emergency Medicine Provider Triage Evaluation Note  Lauren [name] , a 72 y.o. female  was evaluated in triage.  Pt complains of tremor and weakness. She states that "I can barely feed myself." Hx of chronic tremor,schizophrenia, and meningioma resection. Tremor and weakness suddenly worsened 2 days ago Does not take lithium or depakote Review of Systems  Positive: tremor Negative: fever  Physical Exam  BP 104/74 (BP Location: Right Arm)   Pulse 96   Temp 97.9 F (36.6 C) (Oral)   Resp 18   Ht 5' 5"$  (1.651 m)   Wt 104.3 kg   SpO2 94%   BMI 38.27 kg/m  Gen:   Awake, no distress   Resp:  Normal effort  MSK:   Moves extremities without difficulty  Other:  BL upper and lower tremor  Medical Decision Making  Medically screening exam initiated at 12:08 PM.  Appropriate orders placed.  Lauren [name] was informed that the remainder of the evaluation will be completed by another provider, this initial triage assessment does not replace that evaluation, and the importance of remaining in the ED until their evaluation is complete.     Margarita Mail, PA-C 10/24/22 1213

## 2022-10-24 NOTE — ED Provider Notes (Signed)
[facility] Provider Note   CSN: [id number] Arrival date & time: 10/24/22  1048     History  Chief Complaint  Patient presents with   Weakness    Lauren [name] is a 72 y.o. female.  Pt is a 72 yo female with a pmhx significant for htn, hld, dm2, anxiety, depression, fibromyalgia, schizoaffective d/o, sleep apnea, gerd, seizures, and cva.  Pt reports that she's been having weakness and tremors.  She's been having a hard time feeding herself.  She can't cook.  Her cousin's husband does help her out sometimes.  Tremors worsened 2 days ago.          Home Medications Prior to Admission medications   Medication Sig Start Date End Date Taking? Authorizing Provider  amLODipine (NORVASC) 5 MG tablet Take 1 tablet (5 mg total) by mouth daily. 10/01/22   Steele Sizer, MD  gabapentin (NEURONTIN) 100 MG capsule Take 1 capsule (100 mg total) by mouth 2 (two) times daily. 04/01/22   Steele Sizer, MD  haloperidol decanoate (HALDOL DECANOATE) 100 MG/ML injection Inject 100 mg into the muscle every 28 (twenty-eight) days. 01/13/19   [provider]  hydrocortisone 1 % ointment Apply topically 2 (two) times daily. 07/25/22   Steele Sizer, MD  INGREZZA 40 & 80 MG CPPK Take 1 capsule by mouth daily. 07/15/22   [provider]  meloxicam (MOBIC) 7.5 MG tablet Take 1 tablet (7.5 mg total) by mouth daily. 06/18/22   Carole Civil, MD  metFORMIN (GLUCOPHAGE-XR) 500 MG 24 hr tablet Take 1 tablet (500 mg total) by mouth daily with breakfast. 07/25/22   Steele Sizer, MD  QC LO-DOSE ASPIRIN 81 MG EC tablet Take 81 mg by mouth daily. 12/18/21   [provider]  QUEtiapine Fumarate (SEROQUEL XR) 150 MG 24 hr tablet Take 150 mg by mouth at bedtime. 06/17/22   [provider]  rosuvastatin (CRESTOR) 40 MG tablet TAKE 1 TABLET BY MOUTH ONCE DAILY. 10/14/22   Steele Sizer, MD  traZODone (DESYREL) 50 MG tablet TAKE  (1) TABLET BY MOUTH AT BEDTIME. Patient taking differently: Take 50 mg by mouth at bedtime. 02/18/22   Teodora Medici, DO  valsartan (DIOVAN) 320 MG tablet Take 1 tablet (320 mg total) by mouth daily. 10/01/22   Steele Sizer, MD      Allergies    Percocet [oxycodone-acetaminophen], Tramadol hcl, and Vicodin [hydrocodone-acetaminophen]    Review of Systems   Review of Systems  Neurological:  Positive for tremors and weakness.    Physical Exam Updated Vital Signs BP 121/84 (BP Location: Right Arm)   Pulse 77   Temp 97.7 F (36.5 C) (Oral)   Resp 14   Ht '5\' 5"'$  (1.651 m)   Wt 104.3 kg   SpO2 99%   BMI 38.27 kg/m  Physical Exam Vitals and nursing note reviewed.  Constitutional:      Appearance: Normal appearance. She is obese.  HENT:     Head: Normocephalic and atraumatic.     Right Ear: External ear normal.     Left Ear: External ear normal.     Nose: Nose normal.     Mouth/Throat:     Mouth: Mucous membranes are dry.  Eyes:     Extraocular Movements: Extraocular movements intact.     Conjunctiva/sclera: Conjunctivae normal.     Pupils: Pupils are equal, round, and reactive to light.  Cardiovascular:     Rate and Rhythm: Normal rate  and regular rhythm.     Pulses: Normal pulses.     Heart sounds: Normal heart sounds.  Pulmonary:     Effort: Pulmonary effort is normal.     Breath sounds: Normal breath sounds.  Abdominal:     General: Abdomen is flat. Bowel sounds are normal.     Palpations: Abdomen is soft.  Musculoskeletal:     Cervical back: Normal range of motion and neck supple.  Skin:    General: Skin is warm.     Capillary Refill: Capillary refill takes less than 2 seconds.  Neurological:     General: No focal deficit present.     Mental Status: She is alert and oriented to person, place, and time.     Motor: Tremor present.     Comments: Bilateral tremors, but left more than right  Psychiatric:        Mood and Affect: Mood normal.        Behavior:  Behavior normal.     ED Results / Procedures / Treatments   Labs (all labs ordered are listed, but only abnormal results are displayed) Labs Reviewed  BASIC METABOLIC PANEL - Abnormal; Notable for the following components:      Result Value   Potassium 2.8 (*)    Glucose, Bld 184 (*)    Creatinine, Ser 1.55 (*)    GFR, Estimated 36 (*)    All other components within normal limits  CBC - Abnormal; Notable for the following components:   RBC 3.86 (*)    All other components within normal limits  RESP PANEL BY RT-PCR (RSV, FLU A&B, COVID)  [id number]  MAGNESIUM  URINALYSIS, ROUTINE W REFLEX MICROSCOPIC    EKG None  Radiology DG Chest Portable 1 View  Result Date: 10/24/2022 CLINICAL DATA:  Altered mental status.  Weakness and tremors EXAM: PORTABLE CHEST 1 VIEW COMPARISON:  X-ray 05/29/2022 FINDINGS: Underinflation. No consolidation, pneumothorax or effusion. Borderline cardiopericardial silhouette with tortuous and ectatic aorta. No edema. Surgical changes along the left side of the mediastinum. IMPRESSION: No active disease. Borderline cardiopericardial silhouette with tortuous and ectatic aorta. Postop changes. Electronically Signed   By: Jill Side M.D.   On: 10/24/2022 15:17   MR Brain W and Wo Contrast  Result Date: 10/24/2022 CLINICAL DATA:  Neuro deficit, acute, stroke suspected. EXAM: MRI HEAD WITHOUT AND WITH CONTRAST TECHNIQUE: Multiplanar, multiecho pulse sequences of the brain and surrounding structures were obtained without and with intravenous contrast. CONTRAST:  83m GADAVIST GADOBUTROL 1 MMOL/ML IV SOLN COMPARISON:  MRI brain 04/17/2021 and older. FINDINGS: Brain: No acute infarct or hemorrhage. Stable postoperative changes of left parietal craniotomy for meningioma resection with unchanged 1.5 cm dural-based, homogeneously enhancing mass along the medial resection cavity margin (image 11 series 100), overlying the junction of the left superior frontal gyrus and  precentral gyrus with surrounding gliosis (image 30 series 12). Unchanged moderate chronic small-vessel disease. No hydrocephalus. Vascular: Normal flow voids. Skull and upper cervical spine: Prior left parietal craniotomy. Otherwise unremarkable. Sinuses/Orbits: Unremarkable. Other: None. IMPRESSION: 1. No acute intracranial process. 2. Stable postoperative changes of left parietal craniotomy for meningioma resection with unchanged 1.5 cm residual or recurrent tumor along the medial resection cavity margin, overlying the junction of the left superior frontal gyrus and precentral gyrus with surrounding gliosis. Electronically Signed   By: WEmmit AlexandersM.D.   On: 10/24/2022 13:35    Procedures Procedures    Medications Ordered in ED Medications  potassium chloride 10 mEq in  100 mL IVPB (10 mEq Intravenous New Bag/Given 10/24/22 1557)  gadobutrol (GADAVIST) 1 MMOL/ML injection 10 mL (10 mLs Intravenous Contrast Given 10/24/22 1309)  sodium chloride 0.9 % bolus 500 mL (500 mLs Intravenous New Bag/Given 10/24/22 1554)  acetaminophen (TYLENOL) tablet 1,000 mg (1,000 mg Oral Given 10/24/22 1550)  potassium chloride SA (KLOR-CON M) CR tablet 40 mEq (40 mEq Oral Given 10/24/22 1550)    ED Course/ Medical Decision Making/ A&P                             Medical Decision Making Amount and/or Complexity of Data Reviewed Labs: ordered. Radiology: ordered.  Risk OTC drugs. Prescription drug management. Decision regarding hospitalization.   This patient presents to the ED for concern of weakness, this involves an extensive number of treatment options, and is a complaint that carries with it a high risk of complications and morbidity.  The differential diagnosis includes cva, electrolyte abn, anemia, infection   Co morbidities that complicate the patient evaluation  htn, hld, dm2, anxiety, depression, fibromyalgia, schizoaffective d/o, sleep apnea, gerd, seizures, and cva   Additional history  obtained:  Additional history obtained from epic chart review External records from outside source obtained and reviewed including  Lab Tests:  I Ordered, and personally interpreted labs.  The pertinent results include:   cbc nl, bmp with k low at 2.8 and cr elevated at 1.55 (cr and k nl in November)   Imaging Studies ordered:  I ordered imaging studies including mri brain and cxr  I independently visualized and interpreted imaging which showed  MRI brain:  No acute intracranial process.  2. Stable postoperative changes of left parietal craniotomy for  meningioma resection with unchanged 1.5 cm residual or recurrent  tumor along the medial resection cavity margin, overlying the  junction of the left superior frontal gyrus and precentral gyrus  with surrounding gliosis.   I agree with the radiologist interpretation   Cardiac Monitoring:  The patient was maintained on a cardiac monitor.  I personally viewed and interpreted the cardiac monitored which showed an underlying rhythm of: nsr   Medicines ordered and prescription drug management:  I ordered medication including ivfs  for aki  Reevaluation of the patient after these medicines showed that the patient improved I have reviewed the patients home medicines and have made adjustments as needed   Test Considered:  mri   Critical Interventions:  kcl   Consultations Obtained:  I requested consultation with the hospitalist (Dr. Waldron Labs),  and discussed lab and imaging findings as well as pertinent plan - he will admit.   Problem List / ED Course:  Hypokalemia:  k replaced AKI:  IVFs given FTT:  IVFs given.  Pt will likely need TOC consult for placement.   Reevaluation:  After the interventions noted above, I reevaluated the patient and found that they have :improved   Social Determinants of Health:  Lives at home   Dispostion:  After consideration of the diagnostic results and the patients response  to treatment, I feel that the patent would benefit from admission.          Final Clinical Impression(s) / ED Diagnoses Final diagnoses:  Dehydration  AKI (acute kidney injury) ([place])  Hypokalemia  Ambulatory dysfunction  Failure to thrive in adult    Rx / DC Orders ED Discharge Orders     None         Isla Pence, MD  10/24/22 1618  

## 2022-10-24 NOTE — ED Triage Notes (Signed)
Patient states having weakness and tremors. Patient states having complications with feeding herself. Patient states this has been going on for over a month. Patient states having surgery on her left leg over 6 years ago and needing surgery on the right leg. Patient states Dr. Aline Brochure did not advice surgery at this time.

## 2022-10-24 NOTE — ED Notes (Signed)
Earl Gala needs to be contacted when patient is discharged for a ride.

## 2022-10-25 ENCOUNTER — Observation Stay (HOSPITAL_COMMUNITY): Payer: Medicare Other

## 2022-10-25 DIAGNOSIS — R471 Dysarthria and anarthria: Secondary | ICD-10-CM | POA: Diagnosis present

## 2022-10-25 DIAGNOSIS — F200 Paranoid schizophrenia: Secondary | ICD-10-CM | POA: Diagnosis present

## 2022-10-25 DIAGNOSIS — N1832 Chronic kidney disease, stage 3b: Secondary | ICD-10-CM | POA: Diagnosis present

## 2022-10-25 DIAGNOSIS — E1140 Type 2 diabetes mellitus with diabetic neuropathy, unspecified: Secondary | ICD-10-CM | POA: Diagnosis present

## 2022-10-25 DIAGNOSIS — I10 Essential (primary) hypertension: Secondary | ICD-10-CM | POA: Diagnosis not present

## 2022-10-25 DIAGNOSIS — F32A Depression, unspecified: Secondary | ICD-10-CM | POA: Diagnosis present

## 2022-10-25 DIAGNOSIS — F411 Generalized anxiety disorder: Secondary | ICD-10-CM | POA: Diagnosis present

## 2022-10-25 DIAGNOSIS — E1122 Type 2 diabetes mellitus with diabetic chronic kidney disease: Secondary | ICD-10-CM | POA: Diagnosis present

## 2022-10-25 DIAGNOSIS — R569 Unspecified convulsions: Secondary | ICD-10-CM | POA: Diagnosis present

## 2022-10-25 DIAGNOSIS — G4733 Obstructive sleep apnea (adult) (pediatric): Secondary | ICD-10-CM | POA: Diagnosis present

## 2022-10-25 DIAGNOSIS — R630 Anorexia: Secondary | ICD-10-CM | POA: Diagnosis present

## 2022-10-25 DIAGNOSIS — E1169 Type 2 diabetes mellitus with other specified complication: Secondary | ICD-10-CM | POA: Diagnosis not present

## 2022-10-25 DIAGNOSIS — R251 Tremor, unspecified: Secondary | ICD-10-CM

## 2022-10-25 DIAGNOSIS — E785 Hyperlipidemia, unspecified: Secondary | ICD-10-CM | POA: Diagnosis present

## 2022-10-25 DIAGNOSIS — N179 Acute kidney failure, unspecified: Secondary | ICD-10-CM | POA: Diagnosis not present

## 2022-10-25 DIAGNOSIS — I129 Hypertensive chronic kidney disease with stage 1 through stage 4 chronic kidney disease, or unspecified chronic kidney disease: Secondary | ICD-10-CM | POA: Diagnosis present

## 2022-10-25 DIAGNOSIS — R627 Adult failure to thrive: Secondary | ICD-10-CM | POA: Diagnosis present

## 2022-10-25 DIAGNOSIS — M797 Fibromyalgia: Secondary | ICD-10-CM | POA: Diagnosis present

## 2022-10-25 DIAGNOSIS — R633 Feeding difficulties, unspecified: Secondary | ICD-10-CM | POA: Diagnosis present

## 2022-10-25 DIAGNOSIS — Z8249 Family history of ischemic heart disease and other diseases of the circulatory system: Secondary | ICD-10-CM | POA: Diagnosis not present

## 2022-10-25 DIAGNOSIS — E876 Hypokalemia: Secondary | ICD-10-CM | POA: Diagnosis present

## 2022-10-25 DIAGNOSIS — M069 Rheumatoid arthritis, unspecified: Secondary | ICD-10-CM | POA: Diagnosis present

## 2022-10-25 DIAGNOSIS — E860 Dehydration: Secondary | ICD-10-CM | POA: Diagnosis present

## 2022-10-25 DIAGNOSIS — Z6838 Body mass index (BMI) 38.0-38.9, adult: Secondary | ICD-10-CM | POA: Diagnosis not present

## 2022-10-25 LAB — BASIC METABOLIC PANEL
Anion gap: 8 (ref 5–15)
BUN: 30 mg/dL — ABNORMAL HIGH (ref 8–23)
CO2: 26 mmol/L (ref 22–32)
Calcium: 9.7 mg/dL (ref 8.9–10.3)
Chloride: 106 mmol/L (ref 98–111)
Creatinine, Ser: 2.41 mg/dL — ABNORMAL HIGH (ref 0.44–1.00)
GFR, Estimated: 21 mL/min — ABNORMAL LOW (ref >60)
Glucose, Bld: 122 mg/dL — ABNORMAL HIGH (ref 70–99)
Potassium: 3.7 mmol/L (ref 3.5–5.1)
Sodium: 140 mmol/L (ref 135–145)

## 2022-10-25 LAB — CBC
HCT: 33.6 % — ABNORMAL LOW (ref 36.0–46.0)
Hemoglobin: 11.2 g/dL — ABNORMAL LOW (ref 12.0–15.0)
MCH: 32.3 pg (ref 26.0–34.0)
MCHC: 33.3 g/dL (ref 30.0–36.0)
MCV: 96.8 fL (ref 80.0–100.0)
Platelets: 268 10*3/uL (ref 150–400)
RBC: 3.47 MIL/uL — ABNORMAL LOW (ref 3.87–5.11)
RDW: 14.3 % (ref 11.5–15.5)
WBC: 7.2 10*3/uL (ref 4.0–10.5)
nRBC: 0 % (ref 0.0–0.2)

## 2022-10-25 LAB — HIV ANTIBODY (ROUTINE TESTING W REFLEX): HIV Screen 4th Generation wRfx: NONREACTIVE

## 2022-10-25 MED ORDER — VALBENAZINE TOSYLATE 40 MG PO CAPS
40.0000 mg | ORAL_CAPSULE | Freq: Every day | ORAL | Status: DC
  Administered 2022-10-25 – 2022-10-29 (×5): 40 mg via ORAL
  Filled 2022-10-25 (×6): qty 1

## 2022-10-25 MED ORDER — VALBENAZINE TOSYLATE 40 MG PO CAPS
80.0000 mg | ORAL_CAPSULE | Freq: Every day | ORAL | Status: DC
  Filled 2022-10-25 (×4): qty 2

## 2022-10-25 MED ORDER — TRAZODONE HCL 50 MG PO TABS
50.0000 mg | ORAL_TABLET | Freq: Every day | ORAL | Status: DC
  Administered 2022-10-25 – 2022-10-28 (×4): 50 mg via ORAL
  Filled 2022-10-25 (×4): qty 1

## 2022-10-25 NOTE — Hospital Course (Signed)
72 y.o. female, with a known history of meningioma parietal status post meningeal resection , diabetes mellitus, hypertension, brain algia, depression, seizures and CVA, hyperlipidemia, obstructive sleep apnea uses CPAP nightly and schizophrenia  -Presented to ED secondary to weakness, she lives with her cousin for last 3 years, her cousins are intermittently at home, she is taking care of sick family members, and with tremors and dysarthria at baseline, but she has been able to manage with a walker, but she does report she is with progressive weakness, tremors and unsteady gait over last few months, she denies pain, shortness of breath, symptoms became so significant that she could not stand up today, which prompted family to bring her to ED -In ED workup significant for AKI with a creatinine of 1.5, hypokalemia with potassium of 2.8, patient reports poor appetite and oral intake as she cannot cook for herself, and recently has not been able to feed herself as well,.  Urinalysis is pending, this x-ray with no acute findings, MRI brain with no acute intracranial process, stable postoperative meningioma surgery changes, given hypokalemia and AKI [facility] hospitalist consulted to admit

## 2022-10-25 NOTE — Progress Notes (Addendum)
PROGRESS NOTE   Lauren [name]  [id number] DOB: 1951-09-09 DOA: 10/24/2022 PCP: Steele Sizer, MD   Chief Complaint  Patient presents with   Weakness   Level of care: Telemetry  Brief Admission History:  72 y.o. female, with a known history of meningioma parietal status post meningeal resection , diabetes mellitus, hypertension, brain algia, depression, seizures and CVA, hyperlipidemia, obstructive sleep apnea uses CPAP nightly and schizophrenia  -Presented to ED secondary to weakness, she lives with her cousin for last 3 years, her cousins are intermittently at home, she is taking care of sick family members, and with tremors and dysarthria at baseline, but she has been able to manage with a walker, but she does report she is with progressive weakness, tremors and unsteady gait over last few months, she denies pain, shortness of breath, symptoms became so significant that she could not stand up today, which prompted family to bring her to ED -In ED workup significant for AKI with a creatinine of 1.5, hypokalemia with potassium of 2.8, patient reports poor appetite and oral intake as she cannot cook for herself, and recently has not been able to feed herself as well,.  Urinalysis is pending, this x-ray with no acute findings, MRI brain with no acute intracranial process, stable postoperative meningioma surgery changes, given hypokalemia and AKI [facility] hospitalist consulted to admit   Assessment and Plan:  AKI -from volume depletion and dehydration, most likely from inability to feed herself, will keep on IV fluids, avoid nephrotoxic medications -pt received gadolinium for MRI study on 2/15 -renal function worse today -increased rate of IV fluids -ordered bladder scan (11 cc); ordered renal US (pending) -recheck BMp in AM    Hypokalemia -repleted, DC telemetry monitoring   Schizophrenia -She has been followed by Olen Pel FNP in [place], appears to be multiple psych  medications including haloperidol, Seroquel and Ingrezza for her schizophrenia, patient denies any recent changes in her psychiatric medications -Unclear if she is on Cogentin or not, but by reviewing the record it does appear it has been stopped on September, so unclear of stopping Cogentin has been contributing to her symptoms(patient is unclear if she has been taking Cogentin or not or if she has ever been on it, but it does appear to be been discontinued once looking in epic in September 2023) -TTS consulted to assist with behavioral health med management   Progressive weakness, unsteady gait, deconditioning Dysarthria Tremors -Patient with progressive of weakness, tremors, dysarthria, this is most likely side effect of her meds, unclear if she is on Cogentin or not at this point. -Requested TTS consult to assess her meds to see if they can be tapered   History of meningioma -S/p meningeal resection resection -No acute finding on imaging   Diabetes mellitus, type 2  -Will hold metformin and keep an insulin sliding scale  Hyperlipidemia -Continue with statin   Hypertension -Continue with home medications   OSA -continue with CPAP   Obesity Body mass index is 38.27 kg/m.   DVT prophylaxis: [place] heparin Code Status: full  Family Communication:   Consultants:  TTS Procedures:   Antimicrobials:     Subjective: Pt reports that she is able to eat with fingers only now due to tremor but that is an improvement   Objective: Vitals:   10/24/22 1728 10/24/22 2006 10/24/22 2145 10/25/22 0342  BP: (!) 144/84 (!) 104/57  (!) 91/56  Pulse: 84 64 65 63  Resp: 17 19 17 16  $ Temp: 97.7  F (36.5 C) (!) 97.4 F (36.3 C)  98.2 F (36.8 C)  TempSrc: Oral     SpO2: 98% 97% 95% 98%  Weight:      Height:        Intake/Output Summary (Last 24 hours) at 10/25/2022 1113 Last data filed at 10/25/2022 0459 Gross per 24 hour  Intake 939.58 ml  Output --  Net 939.58 ml   Filed  Weights   10/24/22 1122  Weight: 104.3 kg   Examination:  General exam: Appears calm and comfortable intentional tremor seen. Paranoid thoughts.  Respiratory system: Clear to auscultation. Respiratory effort normal. Cardiovascular system: normal S1 & S2 heard. No JVD, murmurs, rubs, gallops or clicks. No pedal edema. Gastrointestinal system: Abdomen is nondistended, soft and nontender. No organomegaly or masses felt. Normal bowel sounds heard. Central nervous system: Alert and oriented. No focal neurological deficits. Extremities: Symmetric 5 x 5 power. Skin: No rashes, lesions or ulcers. Psychiatry: Judgement and insight UTD, has paranoia.  Mood & affect flat.   Data Reviewed: I have personally reviewed following labs and imaging studies  CBC: Recent Labs  Lab 10/24/22 1142 10/25/22 0406  WBC 6.3 7.2  HGB 12.4 11.2*  HCT 37.1 33.6*  MCV 96.1 96.8  PLT 271 [id number]    Basic Metabolic Panel: Recent Labs  Lab 10/24/22 1142 10/25/22 0406  NA 137 140  K 2.8* 3.7  CL 100 106  CO2 25 26  GLUCOSE 184* 122*  BUN 18 30*  CREATININE 1.55* 2.41*  CALCIUM 10.3 9.7  MG 1.8  --     CBG: No results for input(s): "GLUCAP" in the last 168 hours.  No results found for this or any previous visit (from the past 240 hour(s)).   Radiology Studies: DG Chest Portable 1 View  Result Date: 10/24/2022 CLINICAL DATA:  Altered mental status.  Weakness and tremors EXAM: PORTABLE CHEST 1 VIEW COMPARISON:  X-ray 05/29/2022 FINDINGS: Underinflation. No consolidation, pneumothorax or effusion. Borderline cardiopericardial silhouette with tortuous and ectatic aorta. No edema. Surgical changes along the left side of the mediastinum. IMPRESSION: No active disease. Borderline cardiopericardial silhouette with tortuous and ectatic aorta. Postop changes. Electronically Signed   By: Jill Side M.D.   On: 10/24/2022 15:17   MR Brain W and Wo Contrast  Result Date: 10/24/2022 CLINICAL DATA:  Neuro  deficit, acute, stroke suspected. EXAM: MRI HEAD WITHOUT AND WITH CONTRAST TECHNIQUE: Multiplanar, multiecho pulse sequences of the brain and surrounding structures were obtained without and with intravenous contrast. CONTRAST:  81m GADAVIST GADOBUTROL 1 MMOL/ML IV SOLN COMPARISON:  MRI brain 04/17/2021 and older. FINDINGS: Brain: No acute infarct or hemorrhage. Stable postoperative changes of left parietal craniotomy for meningioma resection with unchanged 1.5 cm dural-based, homogeneously enhancing mass along the medial resection cavity margin (image 11 series 100), overlying the junction of the left superior frontal gyrus and precentral gyrus with surrounding gliosis (image 30 series 12). Unchanged moderate chronic small-vessel disease. No hydrocephalus. Vascular: Normal flow voids. Skull and upper cervical spine: Prior left parietal craniotomy. Otherwise unremarkable. Sinuses/Orbits: Unremarkable. Other: None. IMPRESSION: 1. No acute intracranial process. 2. Stable postoperative changes of left parietal craniotomy for meningioma resection with unchanged 1.5 cm residual or recurrent tumor along the medial resection cavity margin, overlying the junction of the left superior frontal gyrus and precentral gyrus with surrounding gliosis. Electronically Signed   By: WEmmit AlexandersM.D.   On: 10/24/2022 13:35    Scheduled Meds:  amLODipine  5 mg Oral Daily  heparin  5,000 Units Subcutaneous Q8H   QUEtiapine Fumarate  150 mg Oral QHS   rosuvastatin  40 mg Oral Daily   valbenazine  40 mg Oral Daily   Continuous Infusions:  sodium chloride 100 mL/hr at 10/25/22 0851     LOS: 0 days   Time spent: 36 mins  Layloni Fahrner Wynetta Emery, MD How to contact the [facility] Attending or Consulting provider [place] or covering provider during after hours [place], for this patient?  Check the care team in [facility] and look for a) attending/consulting [facility] provider listed and b) the [facility] team listed Log into [email or web address] and use [facility]'s universal password to access. If you do not have the password, please contact the hospital operator. Locate the [facility] provider you are looking for under [facility] and page to a number that you can be directly reached. If you still have difficulty reaching the provider, please page the [facility] (Director on Call) for the Hospitalists listed on amion for assistance.  10/25/2022, 11:13 AM

## 2022-10-25 NOTE — NC FL2 (Addendum)
[place] LEVEL OF CARE FORM     IDENTIFICATION  Patient Name: Lauren [name] Birthdate: Aug 09, 1951 Sex: female Admission Date (Current Location): 10/24/2022  [facility] and [place] Number:  [employer] and Address:  [place] [address], [place]      Provider Number: [id number]  Attending Physician Name and Address:  Murlean Iba, MD  Relative Name and Phone Number:  [name] (Relative) [phone]    Current Level of Care: Hospital (observation) Recommended Level of Care: [place] Prior Approval Number:    Date Approved/Denied:   PASRR Number:   [id number] 10/25/2022 11/24/2022  Discharge Plan: SNF    Current Diagnoses: Patient Active Problem List   Diagnosis Date Noted   AKI (acute kidney injury) ([place]) 10/24/2022   Dysarthria 07/25/2022   Seborrhea of face 01/22/2022   IgA monoclonal gammopathy of uncertain significance 04/26/2019   SS-A antibody positive 04/15/2019   Seizure-like activity ([place]) 12/29/2018   Seizures ([place]) 11/24/2018   Paresthesias 11/22/2018   Mouth dryness 10/12/2018   Hx of resection of meningioma 07/21/2018   Tremor 07/21/2018   Morbid obesity ([place]) 12/09/2017   Meningioma ([place]) 11/25/2017   Atherosclerosis of aorta ([place]) 11/25/2017   Calcification of coronary artery 11/25/2017   Schizophrenia, paranoid ([place]) 11/04/2017   Acid reflux 06/29/2015   Dyslipidemia associated with type 2 diabetes mellitus ([place]) 06/29/2015   Cardiac murmur 06/29/2015   Arthritis of knee, degenerative 06/28/2015   Insomnia w/ sleep apnea 03/21/2015   Anxiety disorder 03/21/2015   Essential hypertension 03/21/2015   HLD (hyperlipidemia) 03/21/2015   Urge incontinence 03/21/2015   OSA (obstructive sleep apnea) 02/02/2014    Orientation RESPIRATION BLADDER Height & Weight     Self, Time, Situation, Place  Normal Continent Weight: 230 lb (104.3 kg) Height:  5' 5"$   (165.1 cm)  BEHAVIORAL SYMPTOMS/MOOD NEUROLOGICAL BOWEL NUTRITION STATUS      Continent Diet (regular)  AMBULATORY STATUS COMMUNICATION OF NEEDS Skin   Limited Assist Verbally Normal                       Personal Care Assistance Level of Assistance  Bathing, Feeding, Dressing Bathing Assistance: Limited assistance Feeding assistance: Limited assistance Dressing Assistance: Limited assistance     Functional Limitations Info  Sight, Hearing, Speech Sight Info: Adequate Hearing Info: Adequate Speech Info: Adequate    SPECIAL CARE FACTORS FREQUENCY  PT (By licensed PT)     PT Frequency: 5x/week              Contractures Contractures Info: Not present    Additional Factors Info  Code Status, Allergies, Psychotropic Code Status Info: full code Allergies Info: Percocet, Traadol Hcl, Vicodin Psychotropic Info: Haldol decanoate, Ingrezza, Seroquel XR, Desyrel         Current Medications (10/25/2022):  This is the current hospital active medication list Current Facility-Administered Medications  Medication Dose Route Frequency Provider Last Rate Last Admin   0.9 %  sodium chloride infusion   Intravenous Continuous Johnson, Clanford L, MD 100 mL/hr at 10/25/22 0851 Rate Change at 10/25/22 0851   amLODipine (NORVASC) tablet 5 mg  5 mg Oral Daily Elgergawy, Silver Huguenin, MD   5 mg at 10/25/22 0944   heparin injection 5,000 Units  5,000 Units Subcutaneous Q8H Elgergawy, Silver Huguenin, MD   5,000 Units at 10/25/22 0458   hydrALAZINE (APRESOLINE) injection 5 mg  5 mg Intravenous Q4H PRN Elgergawy, Emeline Gins  S, MD       QUEtiapine (SEROQUEL XR) 24 hr tablet 150 mg  150 mg Oral QHS Elgergawy, Silver Huguenin, MD   150 mg at 10/24/22 2045   rosuvastatin (CRESTOR) tablet 40 mg  40 mg Oral Daily Elgergawy, Silver Huguenin, MD   40 mg at 10/25/22 0944   valbenazine (INGREZZA) capsule 40 mg  40 mg Oral Daily Murlean Iba, MD         Discharge Medications: Please see discharge summary for a list of  discharge medications.  Relevant Imaging Results:  Relevant Lab Results:   Additional Information SSN [id number], Clydene Pugh, LCSW

## 2022-10-25 NOTE — BH Assessment (Signed)
Comprehensive Clinical Assessment (CCA) Note  10/25/2022 Petty Graydon [id number]  Chief Complaint:  Chief Complaint  Patient presents with   Weakness   Visit Diagnosis:   Per  Chart:  F20.9 Schizophrenia F32.3 Major depressive disorder, Single episode, With psychotic features  Flowsheet Row ED to Hosp-Admission (Current) from 10/24/2022 in [place] ED from 06/06/2022 in [facility] Emergency Department at [facility] ED from 10/27/2020 in [facility] Emergency Department at [place] No Risk No Risk No Risk      The patient demonstrates the following risk factors for suicide: Chronic risk factors for suicide include: psychiatric disorder of schizophrenia and previous suicide attempts by overdose . Acute risk factors for suicide include: family or marital conflict and social withdrawal/isolation. Protective factors for this patient include: positive social support, positive therapeutic relationship, hope for the future, and life satisfaction. Considering these factors, the overall suicide risk at this point appears to be no risk. Patient is not appropriate for outpatient follow up.  Disposition: Patsy [place] NP, recommends Pt discharged and follow-up with Counseling Services.  Disposition discussed with Lauren LPN.  LPN to discuss with EDP. Safety planning completed with cousin, Denies Maricela Bo, [phone].  Darolyn Rua Mellenthin is a 72 year old who presents voluntarily to [place] and accompanied by her cousin, [name], [phone], who participated in assessment, via telephone at Pt's request. Pt denies SI, HI or AVH.  Pt states "If it's my time to go, I want to go, I cannot do nothing for myself, I am dependent on everybody".  Pt reports one previous suicide attempt at age 36, by overdose.  Pt denies any intentional self-harm.  Pt's cousin reports, "she is paranoid, goes to the door and say, ' just a  minute', and no one is at the door".  Pt reports the following symptoms, loneliness, sadness, hopelessness, loss of interest, feelings of worthlessness, restlessness, worrying, frustrated, and fatigue. Pt reports twelve or more hours of sleep doing the day, "I am sleeping to much, I cannot do anything, I sleep too much". Pt reports eating three meals daily.  Pt denies drinking alcohol; also, denies using any other substance use.   Pt identifies her primary stressor as with "I am not able to care for myself, I want to be independent, I am in the house, I cannot wash dishes, I need someone to help me, my hands shake too much".  Pt reports that she lives with her cousin, Denies Maricela Bo, and her husband.  Pt's cousin reports the Pt have been living with both her and her husband for three years; also, reports that she is the payee for the patient, "I am taking care of my mother, I spend a lot of time at my mother's house taking care of her, I think that she needs to go into an [place]".  Pt reports her son has a mental health disorder; also, reports a family history of substance used.  Pt denies any history of abuse or trauma.  Pt denies any current legal problems.  Pt cousin reports her husband has shot guns; also reports they are kept in their bedroom.  Pt says she is currently not receiving weekly outpatient therapy; also, reports receiving outpatient medication management from Ellison Carwin, Psychiatric.  Pt reports she take medication as prescribed.    Pt is dressed scrubs, alert, oriented x 4 with pressure speech and calm motor behavior.  Eye contact is avoided,  and Pt is tearful.  Pt's mood is depressed, and affect is depressed.  Thought process relevant.  Pt's insight is good, and judgment is fair.  There is no indication Pt is currently responding to internal stimuli or experiencing delusional thought content.  Pt was cooperative throughout assessment.     CCA Screening, Triage and  Referral (STR)  Patient Reported Information How did you hear about [place]? Family/Friend  What Is the Reason for Your Visit/Call Today? Depression  How Long Has This Been Causing You Problems? 1 wk - 1 month  What Do You Feel Would Help You the Most Today? Treatment for Depression or other mood problem   Have You Recently Had Any Thoughts About Hurting Yourself? No  Are You Planning to Commit Suicide/Harm Yourself At This time? No   Flowsheet Row ED to Hosp-Admission (Current) from 10/24/2022 in [place] ED from 06/06/2022 in [facility] Emergency Department at [facility] ED from 10/27/2020 in [facility] Emergency Department at [place] No Risk No Risk No Risk       Have you Recently Had Thoughts About [place]? No  Are You Planning to Harm Someone at This Time? No  Explanation: n/a   Have You Used Any Alcohol or Drugs in the Past 24 Hours? No  What Did You Use and How Much? n/a   Do You Currently Have a Therapist/Psychiatrist? No  Name of Therapist/Psychiatrist: Name of Therapist/Psychiatrist: Ellison Carwin, Psychiatrist   Have You Been Recently Discharged From Any Office Practice or Programs? No  Explanation of Discharge From Practice/Program: n/a     CCA Screening Triage Referral Assessment Type of Contact: Tele-Assessment  Telemedicine Service Delivery: Telemedicine service delivery: This service was provided via telemedicine using a 2-way, interactive audio and video technology  Is this Initial or Reassessment? Is this Initial or Reassessment?: Initial Assessment  Date Telepsych consult ordered in [facility]:  Date Telepsych consult ordered in [facility]: 10/25/22  Time Telepsych consult ordered in [facility]:    Location of Assessment: [facility]  Provider Location: [facility] Assessment Services   Collateral Involvement: Pt's cousin [name], [phone], particpated in assessment via  telephone   Does Patient Have a [place]? No  Legal Guardian Contact Information: n/a  Copy of Legal Guardianship Form: -- (n/a)  Legal Guardian Notified of Arrival: -- (n/a)  Legal Guardian Notified of Pending Discharge: -- (n/a)  If Minor and Not Living with Parent(s), Who has Custody? n/a  Is CPS involved or ever been involved? -- (n/a)  Is [name] involved or ever been involved? -- (n/a)   Patient Determined To Be At Risk for Harm To Self or Others Based on Review of Patient Reported Information or Presenting Complaint? No  Method: No Plan  Availability of Means: No access or NA  Intent: Vague intent or NA  Notification Required: No need or identified person  Additional Information for Danger to Others Potential: -- (n/a)  Additional Comments for Danger to Others Potential: n/a  Are There Guns or Other Weapons in Your Home? Yes  Types of Guns/Weapons: Shot guns  Are These Corporate investment banker Secured?                            No  Who Could Verify You Are Able To Have These Secured: Pt's cousine, reports her husband have guns and they kept open in the bedroom.  Do You Have any Outstanding Charges, Pending Court Dates, Parole/Probation? No  Contacted To Inform of Risk of Harm To Self or Others: Family/Significant Other:    Does Patient Present under Involuntary Commitment? No    [place] of Residence: [place]   Patient Currently Receiving the Following Services: Medication Management   Determination of Need: Urgent (48 hours)   Options For Referral: Outpatient Therapy     CCA Biopsychosocial Patient Reported Schizophrenia/Schizoaffective Diagnosis in Past: Yes   Strengths: Asking for help   Mental Health Symptoms Depression:   Difficulty Concentrating; Change in energy/activity; Hopelessness; Tearfulness; Worthlessness; Sleep (too much or little)   Duration of Depressive symptoms:  Duration of Depressive Symptoms: Greater  than two weeks   Mania:   None   Anxiety:    Difficulty concentrating; Fatigue; Tension; Worrying; Sleep   Psychosis:   None   Duration of Psychotic symptoms:    Trauma:   None   Obsessions:   None   Compulsions:   None   Inattention:   None   Hyperactivity/Impulsivity:   None   Oppositional/Defiant Behaviors:   None   Emotional Irregularity:   Chronic feelings of emptiness   Other Mood/Personality Symptoms:   Depressed/lack of pleasure    Mental Status Exam Appearance and self-care  Stature:   Average   Weight:   Obese   Clothing:   -- (Pt dressed in scrubs.)   Grooming:   -- (Pt dressed in scrubs.)   Cosmetic use:   None   Posture/gait:   Slumped; Tense   Motor activity:   Repetitive; Slowed   Sensorium  Attention:   Confused   Concentration:   Anxiety interferes   Orientation:   Person; Place; Situation; Object   Recall/memory:   Normal   Affect and Mood  Affect:   Depressed; Tearful; Labile   Mood:   Depressed; Irritable   Relating  Eye contact:   Avoided   Facial expression:   Depressed; Sad; Responsive   Attitude toward examiner:   Cooperative   Thought and Language  Speech flow:  Articulation error; Slow   Thought content:   Appropriate to Mood and Circumstances   Preoccupation:   None   Hallucinations:   None   Organization:   Disorganized   Transport planner of Knowledge:   Fair   Intelligence:   Needs investigation   Abstraction:   Functional   Judgement:   Fair   Art therapist:   Variable   Insight:   Gaps; Good   Decision Making:   Confused   Social Functioning  Social Maturity:   Isolates   Social Judgement:   "Games developer"   Stress  Stressors:   Illness; Relationship   Coping Ability:   Deficient supports   Skill Deficits:   Activities of daily living; Self-care   Supports:   Support needed     Religion: Religion/Spirituality Are You A  Religious Person?: Yes What is Your Religious Affiliation?: Baptist How Might This Affect Treatment?: denies  Leisure/Recreation: Leisure / Recreation Do You Have Hobbies?: Yes Leisure and Hobbies: Pt reports "I use to read"  Exercise/Diet: Exercise/Diet Do You Exercise?: No Have You Gained or Lost A Significant Amount of Weight in the Past Six Months?: No Do You Follow a Special Diet?: No Do You Have Any Trouble Sleeping?: Yes Explanation of Sleeping Difficulties: Pt reports excessive sleeping, "I am sleeping to much, I want to get up and do something, I am sleeping to much"  CCA Employment/Education Employment/Work Situation: Employment / Work Technical sales engineer: On disability Why is Patient on Disability: UTA How Long has Patient Been on Disability: UTA Patient's Job has Been Impacted by Current Illness: No Describe how Patient's Job has Been Impacted: Pt reports currently retired. Has Patient ever Been in the [employer]?: No  Education: Education Is Patient Currently Attending School?: No Last Grade Completed: 12 Did You Attend College?: No Did You Have An Individualized Education Program (IIEP): Yes Did You Have Any Difficulty At School?:  (Pt did not provide detail information) Patient's Education Has Been Impacted by Current Illness:  (Pt did not provide detail information)   CCA Family/Childhood History Family and Relationship History: Family history Marital status: Single Does patient have children?: Yes How many children?: 1 How is patient's relationship with their children?: close  Childhood History:  Childhood History By whom was/is the patient raised?: Both parents Did patient suffer any verbal/emotional/physical/sexual abuse as a child?: No Did patient suffer from severe childhood neglect?: No Has patient ever been sexually abused/assaulted/raped as an adolescent or adult?: No Was the patient ever a victim of a crime or a disaster?:  No Witnessed domestic violence?: No Has patient been affected by domestic violence as an adult?: No       CCA Substance Use Alcohol/Drug Use: Alcohol / Drug Use Pain Medications: see PTA  Prescriptions: see PTA Over the Counter: see PTA History of alcohol / drug use?: No history of alcohol / drug abuse Longest period of sobriety (when/how long): n/a (n/a) Withdrawal Symptoms:  (n/a)                         ASAM's:  Six Dimensions of Multidimensional Assessment  Dimension 1:  Acute Intoxication and/or Withdrawal Potential:   Dimension 1:  Description of individual's past and current experiences of substance use and withdrawal:  (n/a)  Dimension 2:  Biomedical Conditions and Complications:   Dimension 2:  Description of patient's biomedical conditions and  complications:  (n/a)  Dimension 3:  Emotional, Behavioral, or Cognitive Conditions and Complications:     Dimension 4:  Readiness to Change:  Dimension 4:  Description of Readiness to Change criteria:  (n/a)  Dimension 5:  Relapse, Continued use, or Continued Problem Potential:  Dimension 5:  Relapse, continued use, or continued problem potential critiera description:  (n/a)  Dimension 6:  Recovery/Living Environment:  Dimension 6:  Recovery/Iiving environment criteria description:  (n/a)  ASAM Severity Score:    ASAM Recommended Level of Treatment: ASAM Recommended Level of Treatment:  (n/a)   Substance use Disorder (SUD) Substance Use Disorder (SUD)  Checklist Symptoms of Substance Use:  (n/a)  Recommendations for Services/Supports/Treatments: Recommendations for Services/Supports/Treatments Recommendations For Services/Supports/Treatments: Individual Therapy, Medication Management  Discharge Disposition:    DSM5 Diagnoses: Patient Active Problem List   Diagnosis Date Noted   AKI (acute kidney injury) ([place]) 10/24/2022   Dysarthria 07/25/2022   Seborrhea of face 01/22/2022   IgA monoclonal gammopathy of  uncertain significance 04/26/2019   SS-A antibody positive 04/15/2019   Seizure-like activity ([place]) 12/29/2018   Seizures ([place]) 11/24/2018   Paresthesias 11/22/2018   Mouth dryness 10/12/2018   Hx of resection of meningioma 07/21/2018   Tremor 07/21/2018   Morbid obesity ([place]) 12/09/2017   Meningioma ([place]) 11/25/2017   Atherosclerosis of aorta ([place]) 11/25/2017   Calcification of coronary artery 11/25/2017   Schizophrenia, paranoid ([place]) 11/04/2017   Acid reflux 06/29/2015   Dyslipidemia associated with type  2 diabetes mellitus ([place]) 06/29/2015   Cardiac murmur 06/29/2015   Arthritis of knee, degenerative 06/28/2015   Insomnia w/ sleep apnea 03/21/2015   Anxiety disorder 03/21/2015   Essential hypertension 03/21/2015   HLD (hyperlipidemia) 03/21/2015   Urge incontinence 03/21/2015   OSA (obstructive sleep apnea) 02/02/2014     Referrals to Alternative Service(s): Referred to Alternative Service(s):   Place:   Date:   Time:    Referred to Alternative Service(s):   Place:   Date:   Time:    Referred to Alternative Service(s):   Place:   Date:   Time:    Referred to Alternative Service(s):   Place:   Date:   Time:     Leonides Schanz, Counselor

## 2022-10-25 NOTE — Evaluation (Signed)
Physical Therapy Evaluation Patient Details Name: Lauren [name] MRN: [id number] DOB: 11-09-1950 Today's Date: 10/25/2022  History of Present Illness  Lauren [name]  is a 72 y.o. female, with a known history of meningioma parietal status post meningeal resection , diabetes mellitus, hypertension, brain algia, depression, seizures and CVA, hyperlipidemia, obstructive sleep apnea uses CPAP nightly and schizophrenia   -Presented to ED secondary to weakness, she lives with her cousin for last 3 years, her cousins are intermittently at home, she is taking care of sick family members, and with tremors and dysarthria at baseline, but she has been able to manage with a walker, but she does report she is with progressive weakness, tremors and unsteady gait over last few months, she denies pain, shortness of breath, symptoms became so significant that she could not stand up today, which prompted family to bring her to ED  -In ED workup significant for AKI with a creatinine of 1.5, hypokalemia with potassium of 2.8, patient reports poor appetite and oral intake as she cannot cook for herself, and recently has not been able to feed herself as well,.  Urinalysis is pending, this x-ray with no acute findings, MRI brain with no acute intracranial process, stable postoperative meningioma surgery changes, given hypokalemia and AKI [facility] hospitalist consulted to admit   Clinical Impression  Patient requires increased time for sitting up at bedside and frequent rest breaks.  Patient unsteady having to lean on nearby objects for support when using her cane, safer using RW demonstrating slow labored cadence ambulating in room/hallway and limited mostly due to fatigue.  Patient tolerated sitting up in chair after therapy - nurse notified.  Patient will benefit from continued skilled physical therapy in hospital and recommended venue below to increase strength, balance, endurance for safe ADLs and gait.          Recommendations for follow up therapy are one component of a multi-disciplinary discharge planning process, led by the attending physician.  Recommendations may be updated based on patient status, additional functional criteria and insurance authorization.  Follow Up Recommendations Skilled nursing-short term rehab (<3 hours/day) Can patient physically be transported by private vehicle: Yes    Assistance Recommended at Discharge Intermittent Supervision/Assistance  Patient can return home with the following  A little help with walking and/or transfers;A little help with bathing/dressing/bathroom;Help with stairs or ramp for entrance;Assistance with cooking/housework    Equipment Recommendations None recommended by PT  Recommendations for Other Services       Functional Status Assessment Patient has had a recent decline in their functional status and demonstrates the ability to make significant improvements in function in a reasonable and predictable amount of time.     Precautions / Restrictions Precautions Precautions: Fall Restrictions Weight Bearing Restrictions: No      Mobility  Bed Mobility Overal bed mobility: Needs Assistance Bed Mobility: Supine to Sit     Supine to sit: Min assist, Min guard     General bed mobility comments: increased time, labored movement with frequent rest breaks    Transfers Overall transfer level: Needs assistance Equipment used: Rolling walker (2 wheels), Straight cane Transfers: Sit to/from Stand, Bed to chair/wheelchair/BSC Sit to Stand: Min guard, Min assist   Step pivot transfers: Min guard, Min assist       General transfer comment: slow labored movement    Ambulation/Gait Ambulation/Gait assistance: Min assist Gait Distance (Feet): 40 Feet Assistive device: Rolling walker (2 wheels) Gait Pattern/deviations: Decreased step length - right, Decreased step length -  left, Decreased stride length, Trunk flexed Gait velocity:  decreased     General Gait Details: slow labored cadence requiring increased time for making turns using RW, occasional verbal cueing for safety and limited mostly due to fatigue  Stairs            Wheelchair Mobility    Modified Rankin (Stroke Patients Only)       Balance Overall balance assessment: Needs assistance Sitting-balance support: Feet supported, No upper extremity supported Sitting balance-Leahy Scale: Fair Sitting balance - Comments: seated at EOB   Standing balance support: During functional activity, Bilateral upper extremity supported Standing balance-Leahy Scale: Fair Standing balance comment: poor using SPC, fair/poor using RW                             Pertinent Vitals/Pain Pain Assessment Pain Assessment: No/denies pain    Home Living Family/patient expects to be discharged to:: Private residence Living Arrangements: Other relatives Available Help at Discharge: Family;Available PRN/intermittently Type of Home: House Home Access: Stairs to enter Entrance Stairs-Rails: None Entrance Stairs-Number of Steps: 1 step   Home Layout: One level Home Equipment: Conservation officer, nature (2 wheels);Cane - single point      Prior Function Prior Level of Function : Independent/Modified Independent             Mobility Comments: Pt reports community ambulation with cane and use of grocery cart. Pt does not drive. ADLs Comments: Pt reports independence but seemingly with much difficulty.     Hand Dominance   Dominant Hand: Right    Extremity/Trunk Assessment   Upper Extremity Assessment Upper Extremity Assessment: Defer to OT evaluation    Lower Extremity Assessment Lower Extremity Assessment: Generalized weakness    Cervical / Trunk Assessment Cervical / Trunk Assessment: Kyphotic  Communication   Communication: No difficulties  Cognition Arousal/Alertness: Awake/alert Behavior During Therapy: WFL for tasks  assessed/performed Overall Cognitive Status: Within Functional Limits for tasks assessed                                          General Comments      Exercises     Assessment/Plan    PT Assessment Patient needs continued PT services  PT Problem List Decreased strength;Decreased activity tolerance;Decreased balance;Decreased mobility       PT Treatment Interventions DME instruction;Gait training;Stair training;Functional mobility training;Therapeutic activities;Therapeutic exercise;Balance training;Patient/family education    PT Goals (Current goals can be found in the Care Plan section)  Acute Rehab PT Goals Patient Stated Goal: return home with family to assist PT Goal Formulation: With patient Time For Goal Achievement: 11/08/22 Potential to Achieve Goals: Good    Frequency Min 3X/week     Co-evaluation PT/OT/SLP Co-Evaluation/Treatment: Yes Reason for Co-Treatment: To address functional/ADL transfers PT goals addressed during session: Mobility/safety with mobility;Balance;Proper use of DME         AM-PAC PT "6 Clicks" Mobility  Outcome Measure Help needed turning from your back to your side while in a flat bed without using bedrails?: None Help needed moving from lying on your back to sitting on the side of a flat bed without using bedrails?: A Little Help needed moving to and from a bed to a chair (including a wheelchair)?: A Little Help needed standing up from a chair using your arms (e.g., wheelchair or bedside chair)?: A Little  Help needed to walk in hospital room?: A Lot Help needed climbing 3-5 steps with a railing? : A Lot 6 Click Score: 17    End of Session   Activity Tolerance: Patient tolerated treatment well;Patient limited by fatigue Patient left: in chair;with call bell/phone within reach Nurse Communication: Mobility status PT Visit Diagnosis: Unsteadiness on feet (R26.81);Other abnormalities of gait and mobility  (R26.89);Muscle weakness (generalized) (M62.81)    Time: [id number] PT Time Calculation (min) (ACUTE ONLY): 28 min   Charges:   PT Evaluation $PT Eval Moderate Complexity: 1 Mod PT Treatments $Therapeutic Activity: 23-37 mins        2:25 PM, 10/25/22 Lonell Grandchild, MPT Physical Therapist with [facility] [phone] office [phone] mobile phone

## 2022-10-25 NOTE — Progress Notes (Signed)
Patient complained that BiPAP was blowing to low. Have increased to 18/12 auto to ramp to 18. She states it feels better now.

## 2022-10-25 NOTE — TOC Initial Note (Addendum)
Transition of Care [facility]) - Initial/Assessment Note    Patient Details  Name: Lauren [name] MRN: [id number] Date of Birth: Aug 12, 1951  Transition of Care [facility]) CM/SW Contact:    Lauren Gully, LCSW Phone Number: 10/25/2022, 12:54 PM  Clinical Narrative:                 Patient from home with cousin, Lauren [name], who is also her payee. Patient uses a walker. Recently, patient's hands have began to have tremors and she has felt weakened and unable to care for herself. Lauren [name], states that she and patient have been living together for three years and prior to recently patient has been independent. Sister, Lauren [name], stated that patient needs to be in a facility where she can get increased assistance. Discussed that patient's medicare will pay for short-term rehabilitation. Lauren [name], indicates that she is not able to provide additional care for patient and feels rehab would be best currently. Patient is agreeable to rehab. Referral sent to facilities. Authorization started.   Expected Discharge Plan: Skilled Nursing Facility Barriers to Discharge: Continued Medical Work up   Patient Goals and CMS Choice Patient states their goals for this hospitalization and ongoing recovery are:: rehab          Expected Discharge Plan and Services       Living arrangements for the past 2 months: Single Family Home                                      Prior Living Arrangements/Services Living arrangements for the past 2 months: Single Family Home Lives with:: Relatives Patient language and need for interpreter reviewed:: Yes        Need for Family Participation in Patient Care: Yes (Comment) Care giver support system in place?: Yes (comment) Current home services: DME (cane) Criminal Activity/Legal Involvement Pertinent to Current Situation/Hospitalization: No - Comment as needed  Activities of Daily Living Home Assistive Devices/Equipment: Eyeglasses, Splint (specify type),  Cane (specify quad or straight) ADL Screening (condition at time of admission) Patient's cognitive ability adequate to safely complete daily activities?: Yes Is the patient deaf or have difficulty hearing?: No Does the patient have difficulty seeing, even when wearing glasses/contacts?: No Does the patient have difficulty concentrating, remembering, or making decisions?: No Patient able to express need for assistance with ADLs?: Yes Does the patient have difficulty dressing or bathing?: No Independently performs ADLs?: No Communication: Independent Dressing (OT): Independent Grooming: Independent Feeding: Independent Bathing: Needs assistance Is this a change from baseline?: Change from baseline, expected to last <3 days Toileting: Independent with device (comment) In/Out Bed: Needs assistance Is this a change from baseline?: Change from baseline, expected to last <3 days Walks in Home: Independent with device (comment) Does the patient have difficulty walking or climbing stairs?: Yes Weakness of Legs: Both Weakness of Arms/Hands: Both  Permission Sought/Granted Permission sought to share information with : Family Supports    Share Information with NAME: sister, Lauren [name]; cousin, Lauren [name]           Emotional Assessment     Affect (typically observed): Appropriate Orientation: : Oriented to Self, Oriented to Place, Oriented to  Time, Oriented to Situation Alcohol / Substance Use: Not Applicable Psych Involvement: No (comment)  Admission diagnosis:  Dehydration [E86.0] Hypokalemia [E87.6] Failure to thrive in adult [R62.7] AKI (acute kidney injury) ([place]) [N17.9] Ambulatory dysfunction [R26.2] Acute renal failure (ARF) ([place]) [N17.9]  Patient Active Problem List   Diagnosis Date Noted   Acute renal failure (ARF) ([place]) 10/25/2022   AKI (acute kidney injury) ([place]) 10/24/2022   Dysarthria 07/25/2022   Seborrhea of face 01/22/2022   IgA monoclonal gammopathy of uncertain  significance 04/26/2019   SS-A antibody positive 04/15/2019   Seizure-like activity ([place]) 12/29/2018   Seizures ([place]) 11/24/2018   Paresthesias 11/22/2018   Mouth dryness 10/12/2018   Hx of resection of meningioma 07/21/2018   Tremor 07/21/2018   Morbid obesity ([place]) 12/09/2017   Meningioma ([place]) 11/25/2017   Atherosclerosis of aorta ([place]) 11/25/2017   Calcification of coronary artery 11/25/2017   Schizophrenia, paranoid ([place]) 11/04/2017   Acid reflux 06/29/2015   Dyslipidemia associated with type 2 diabetes mellitus ([place]) 06/29/2015   Cardiac murmur 06/29/2015   Arthritis of knee, degenerative 06/28/2015   Insomnia w/ sleep apnea 03/21/2015   Anxiety disorder 03/21/2015   Essential hypertension 03/21/2015   HLD (hyperlipidemia) 03/21/2015   Urge incontinence 03/21/2015   OSA (obstructive sleep apnea) 02/02/2014   PCP:  Lauren Sizer, MD Pharmacy:   [place], [place] [address] Phone: [phone] Fax: [phone]     Social Determinants of Health (SDOH) Social History: SDOH Screenings   Food Insecurity: No Food Insecurity (01/22/2022)  Housing: Low Risk  (01/22/2022)  Transportation Needs: No Transportation Needs (01/22/2022)  Alcohol Screen: Low Risk  (01/22/2022)  Depression (PHQ2-9): Low Risk  (10/01/2022)  Financial Resource Strain: Low Risk  (01/22/2022)  Physical Activity: Inactive (01/22/2022)  Social Connections: Socially Isolated (01/22/2022)  Stress: No Stress Concern Present (01/22/2022)  Tobacco Use: Low Risk  (10/24/2022)   SDOH Interventions:     Readmission Risk Interventions     No data to display

## 2022-10-25 NOTE — Plan of Care (Signed)
  Problem: Acute Rehab OT Goals (only OT should resolve) Goal: Pt. Will Perform Eating Flowsheets (Taken 10/25/2022 0935) Pt Will Perform Eating:  with modified independence  sitting Goal: Pt. Will Perform Grooming Flowsheets (Taken 10/25/2022 0935) Pt Will Perform Grooming:  with modified independence  sitting Goal: Pt. Will Perform Lower Body Bathing Flowsheets (Taken 10/25/2022 0935) Pt Will Perform Lower Body Bathing:  with modified independence  sitting/lateral leans Goal: Pt. Will Perform Upper Body Dressing Flowsheets (Taken 10/25/2022 0935) Pt Will Perform Upper Body Dressing:  with modified independence  sitting Goal: Pt. Will Perform Lower Body Dressing Flowsheets (Taken 10/25/2022 0935) Pt Will Perform Lower Body Dressing:  with modified independence  sitting/lateral leans Goal: Pt. Will Transfer To Toilet Flowsheets (Taken 10/25/2022 0935) Pt Will Transfer to Toilet:  with modified independence  ambulating Goal: Pt. Will Perform Toileting-Clothing Manipulation Flowsheets (Taken 10/25/2022 0935) Pt Will Perform Toileting - Clothing Manipulation and hygiene:  with modified independence  sitting/lateral leans Goal: Pt/Caregiver Will Perform Home Exercise Program Flowsheets (Taken 10/25/2022 0935) Pt/caregiver will Perform Home Exercise Program:  Increased strength  Both right and left upper extremity  Independently  Elyza Whitt OT, MOT

## 2022-10-25 NOTE — Progress Notes (Signed)
Please be advised that the above named patient will require a short-term nursing home stay--anticipated 30 days or less rehabilitation and strengthening. The plan is for return home.

## 2022-10-25 NOTE — Evaluation (Addendum)
Occupational Therapy Evaluation Patient Details Name: Lauren [name] MRN: [id number] DOB: 1951/04/14 Today's Date: 10/25/2022   History of Present Illness Lauren [name]  is a 72 y.o. female, with a known history of meningioma parietal status post meningeal resection , diabetes mellitus, hypertension, brain algia, depression, seizures and CVA, hyperlipidemia, obstructive sleep apnea uses CPAP nightly and schizophrenia   -Presented to ED secondary to weakness, she lives with her cousin for last 3 years, her cousins are intermittently at home, she is taking care of sick family members, and with tremors and dysarthria at baseline, but she has been able to manage with a walker, but she does report she is with progressive weakness, tremors and unsteady gait over last few months, she denies pain, shortness of breath, symptoms became so significant that she could not stand up today, which prompted family to bring her to ED  -In ED workup significant for AKI with a creatinine of 1.5, hypokalemia with potassium of 2.8, patient reports poor appetite and oral intake as she cannot cook for herself, and recently has not been able to feed herself as well,.  Urinalysis is pending, this x-ray with no acute findings, MRI brain with no acute intracranial process, stable postoperative meningioma surgery changes, given hypokalemia and AKI [facility] hospitalist consulted to admit (Per MD)   Clinical Impression   Pt agreeable to OT and PT co-evaluation. Pt reports limited family support and increasing difficulty with taking care of herself. Today pt demonstrates very slow labored effort with need for intermittent assist for mobility. Much assist needed for lower body dressing and likely bathing at this time. Pt was left in the chair with call bell within reach. Pt noted to become more unstable after prolonged ambulation even with RW. Pt will benefit from continued OT in the hospital and recommended venue below to increase  strength, balance, and endurance for safe ADL's.        Recommendations for follow up therapy are one component of a multi-disciplinary discharge planning process, led by the attending physician.  Recommendations may be updated based on patient status, additional functional criteria and insurance authorization.   Follow Up Recommendations  Skilled nursing-short term rehab (<3 hours/day)     Assistance Recommended at Discharge Intermittent Supervision/Assistance  Patient can return home with the following A little help with walking and/or transfers;A lot of help with bathing/dressing/bathroom;Assistance with cooking/housework;Assist for transportation;Help with stairs or ramp for entrance;Assistance with feeding    Functional Status Assessment  Patient has had a recent decline in their functional status and demonstrates the ability to make significant improvements in function in a reasonable and predictable amount of time.  Equipment Recommendations  None recommended by OT           Precautions / Restrictions Precautions Precautions: Fall Restrictions Weight Bearing Restrictions: No      Mobility Bed Mobility Overal bed mobility: Needs Assistance Bed Mobility: Supine to Sit     Supine to sit: Min assist, Min guard     General bed mobility comments: Very slow labored movement.    Transfers Overall transfer level: Needs assistance Equipment used: Rolling walker (2 wheels), Straight cane Transfers: Sit to/from Stand, Bed to chair/wheelchair/BSC Sit to Stand: Min guard, Min assist     Step pivot transfers: Min guard, Min assist     General transfer comment: Very labored effort and slow pace.      Balance Overall balance assessment: Needs assistance Sitting-balance support: No upper extremity supported, Feet supported Sitting balance-Leahy Scale:  Fair Sitting balance - Comments: seated at EOB   Standing balance support: During functional activity, Bilateral  upper extremity supported Standing balance-Leahy Scale: Fair Standing balance comment: poor to fair with RW                           ADL either performed or assessed with clinical judgement   ADL Overall ADL's : Needs assistance/impaired Eating/Feeding: Minimal assistance;Sitting;Set up Eating/Feeding Details (indicate cue type and reason): Pt was ble to bring food to her mouth without using utensils; very slow labored effort. Grooming: Minimal assistance;Sitting   Upper Body Bathing: Minimal assistance;Sitting;Min guard   Lower Body Bathing: Moderate assistance;Maximal assistance;Sitting/lateral leans   Upper Body Dressing : Minimal assistance;Sitting;Min guard   Lower Body Dressing: Moderate assistance;Maximal assistance;Sitting/lateral leans Lower Body Dressing Details (indicate cue type and reason): Pt started to don L sock but required assist. Very slow labored effort. Toilet Transfer: Ambulation;Rolling walker (2 wheels);Minimal assistance;Min guard (cane) Toilet Transfer Details (indicate cue type and reason): Simulated via ambulation in room/hall as well as stand pivot to chair with cane. Toileting- Clothing Manipulation and Hygiene: Minimal assistance;Sitting/lateral lean;Min guard       Functional mobility during ADLs: Minimal assistance;Rolling walker (2 wheels) General ADL Comments: Able to ambulate in hall with RW and assist to manipulate RW.     Vision Baseline Vision/History: 1 Wears glasses Ability to See in Adequate Light: 0 Adequate Patient Visual Report: No change from baseline Vision Assessment?: No apparent visual deficits                Pertinent Vitals/Pain Pain Assessment Pain Assessment: No/denies pain Pain Score: 0-No pain Pain Descriptors / Indicators:  (Pt reports numbness in hands and legs.)     Hand Dominance Right   Extremity/Trunk Assessment Upper Extremity Assessment Upper Extremity Assessment: Generalized weakness (Per  observation during functional tasks.)   Lower Extremity Assessment Lower Extremity Assessment: Defer to PT evaluation   Cervical / Trunk Assessment Cervical / Trunk Assessment: Kyphotic   Communication Communication Communication: No difficulties   Cognition Arousal/Alertness: Awake/alert Behavior During Therapy: WFL for tasks assessed/performed Overall Cognitive Status: Within Functional Limits for tasks assessed                                                        Home Living Family/patient expects to be discharged to:: Private residence Living Arrangements: Other relatives Available Help at Discharge: Family;Available PRN/intermittently Type of Home: House Home Access: Stairs to enter [employer] of Steps: 1 stoop Entrance Stairs-Rails: None Home Layout: One level     Bathroom Shower/Tub: Teacher, early years/pre: Standard Bathroom Accessibility: No   Home Equipment: Conservation officer, nature (2 wheels);Cane - single point (RW per chart review)          Prior Functioning/Environment Prior Level of Function : Independent/Modified Independent             Mobility Comments: Pt reports community ambulation with cane and use of grocery cart. Pt does not drive. ADLs Comments: Pt reports independence but seemingly with much difficulty.        OT Problem List: Decreased strength;Decreased activity tolerance;Impaired balance (sitting and/or standing);Decreased coordination      OT Treatment/Interventions: Self-care/ADL training;Therapeutic exercise;DME and/or AE instruction;Therapeutic activities;Patient/family education;Balance training  OT Goals(Current goals can be found in the care plan section) Acute Rehab OT Goals Patient Stated Goal: Open to rehab stay. (get stronger at rehab). OT Goal Formulation: With patient Time For Goal Achievement: 11/08/22 Potential to Achieve Goals: Good  OT Frequency: Min 2X/week     Co-evaluation PT/OT/SLP Co-Evaluation/Treatment: Yes Reason for Co-Treatment: To address functional/ADL transfers   OT goals addressed during session: ADL's and self-care      AM-PAC OT "6 Clicks" Daily Activity     Outcome Measure Help from another person eating meals?: A Little Help from another person taking care of personal grooming?: A Little Help from another person toileting, which includes using toliet, bedpan, or urinal?: A Little Help from another person bathing (including washing, rinsing, drying)?: A Lot Help from another person to put on and taking off regular upper body clothing?: A Little Help from another person to put on and taking off regular lower body clothing?: A Lot 6 Click Score: 16   End of Session Equipment Utilized During Treatment: Rolling walker (2 wheels);Gait belt;Other (comment) (cane)  Activity Tolerance: Patient tolerated treatment well Patient left: in chair;with call bell/phone within reach  OT Visit Diagnosis: Unsteadiness on feet (R26.81);Other abnormalities of gait and mobility (R26.89);Muscle weakness (generalized) (M62.81)                Time: [id number] OT Time Calculation (min): 20 min Charges:  OT General Charges $OT Visit: 1 Visit OT Evaluation $OT Eval Low Complexity: 1 Low  Chistina Roston OT, MOT   Larey Seat 10/25/2022, 9:30 AM

## 2022-10-25 NOTE — Consult Note (Signed)
Telepsych Consultation   Reason for Consult:  Medication recommendation Referring Physician:  Dr Wynetta Emery Location of Patient: [facility] Location of Provider: [place] Department  Patient Identification: Lauren [name] MRN:  [id number] Principal Diagnosis: <principal problem not specified> Diagnosis:  Active Problems:   Anxiety disorder   Essential hypertension   Dyslipidemia associated with type 2 diabetes mellitus ([place])   Schizophrenia, paranoid ([place])   Meningioma ([employer])   Morbid obesity ([place])   Tremor   Seizure-like activity ([place])   Dysarthria   AKI (acute kidney injury) ([place])   Total Time spent with patient: 30 minutes  Subjective:   Lauren [name] is a 72 y.o. female patient admitted voluntarily on 10/24/2022,  complaints include "weakness and tremors."  Patient states "I would like to be more independent and able to take care of myself, I would like to sleep less.  I feel like I have lost all the activity of my limbs in my hands and in my knees, also have numbness and tremors, began 5 months ago but worsening for the last 2 weeks."  HPI:   Patient is assessed by nurse practitioner virtually, via telepsychiatry monitor.  She is seated in chair, no apparent distress.  She is alert and oriented, pleasant and cooperative during assessment.  Patient appears to fall asleep multiple times during assessment.  She is easily awakened by this Probation officer repeating her name.  Patient presents with depressed mood, congruent affect.  Patient states "I live with my cousin but she has been away taking care of her mother most of the time, my cousin's husband husband is there."  Patient reports she is unable to care for herself and her cousin's husband is very minimally involved.  Patient states "I sometimes wish God would take me because I do not know when I did anybody to deserve to suffer like I have been suffering for months and months."  Patient denies  suicidal and homicidal ideation.  She easily contracts verbally for safety with this Probation officer.  She endorses 1 previous suicide attempt approximately 40 years ago.  No non-suicidal self-harm reported.  She denies auditory and visual hallucinations currently.  Reports not experiencing any hallucinations for 40 or more years.  She denies symptoms of paranoia.  Amoni's diagnoses include generalized anxiety and paranoid schizophrenia.  She is followed by outpatient psychiatry, Dr. Olen Pel who visits patient in her home.  Medications include Haldol decanoate 100 mg IM q. 28 days, has been stable on long-acting injectable for approximately 4 years.  Most recent Haldol injection on 10/14/2022.  She is also prescribed trazodone 50 mg nightly, she has been stable on this medication for 3 years. Medications recently updated by outpatient provider include Seroquel 150 mg nightly added 5 months ago and Ingrezza 40 mg daily added 3 months ago.  Patient reports sleeping up to 12 hours/day.  She would like to sleep less.  She also endorses depressed mood.  Denies positive symptoms including auditory visual hallucinations.  No recent episodes of hallucinations.  No paranoia reported.  Lauren [name] resides in [place] with her cousin and her cousin's husband.  She denies access to weapons.  She receives disability income.  She denies alcohol and substance use.  She endorses average appetite.  Patient offered support and encouragement.  She verbalizes understanding of strict return precautions.  Safety discharge planning completed with family member/caregiver by TTS counselor.   Patient and family are educated and verbalize understanding of mental health resources and other crisis  services in the community. They are instructed to call 911 and present to the nearest emergency room should patient experience any suicidal/homicidal ideation, auditory/visual/hallucinations, or detrimental worsening of mental health condition.      Past Psychiatric History: Paranoid schizophrenia, anxiety disorder  Risk to Self:   Denies Risk to Others:   Denies Prior Inpatient Therapy:   2 previous inpatient psychiatric admissions, most recent 2019  [facility] Prior Outpatient Therapy:   currently managed by Dr Olen Pel  Past Medical History:  Past Medical History:  Diagnosis Date   Anxiety    Apnea, sleep 02/02/2014   Awareness of heartbeats 02/02/2014   Breathlessness on exertion 02/02/2014   Diabetes mellitus    Encounter for pre-employment examination 06/29/2015   Excessive sweating 07/05/2015   Fibromyalgia    GERD (gastroesophageal reflux disease)    Gravida 1 10/26/2015   1.     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 last seizure 2013!   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   Stiffness of both knees 06/22/2015   Stroke [facility])     Past Surgical History:  Procedure Laterality Date   BRAIN TUMOR EXCISION  2012   benign   CARDIAC CATHETERIZATION  2008   CESAREAN SECTION     CYST REMOVAL HAND     LUNG LOBECTOMY  1977   benign tumor   TOTAL KNEE ARTHROPLASTY Left 01/28/2017   Procedure: TOTAL KNEE ARTHROPLASTY;  Surgeon: Corky Mull, MD;  Location: [facility] ORS;  Service: Orthopedics;  Laterality: Left;   Family History:  Family History  Problem Relation Age of Onset   Heart disease Brother    Depression Mother    Heart attack Mother    Stroke Mother    Alcohol abuse Father    Stroke Father    Diabetes Sister    Diabetes Sister    Stomach cancer Sister    Kidney disease Sister    COPD Brother    Lung cancer Brother    Diabetes Brother    Family Psychiatric  History: father-alcohol use  disorder Social History: resides with sister, requires assistance with ADLs Social History   Substance and Sexual Activity  Alcohol Use No   Alcohol/week: 0.0 standard drinks of alcohol     Social History   Substance and Sexual Activity  Drug Use No    Social History   Socioeconomic History   Marital status: Single    Spouse name: Not on file   Number of children: 1   Years of education: Not on file   Highest education level: 12th grade  Occupational History   Occupation: disbaled  Tobacco Use   Smoking status: Never   Smokeless tobacco: Never  Vaping Use   Vaping Use: Never used  Substance and Sexual Activity   Alcohol use: No    Alcohol/week: 0.0 standard drinks of alcohol   Drug use: No   Sexual activity: Not Currently  Other Topics Concern   Not on file  Social History Narrative   Pt currently living with her cousin in [place] who is also her  Payee   Social Determinants of Health   Financial Resource Strain: Low Risk  (01/22/2022)   Overall Financial Resource Strain (CARDIA)    Difficulty of Paying Living Expenses: Not hard at all  Food Insecurity: No Food Insecurity (01/22/2022)   Hunger Vital Sign    Worried About Running Out of Food in the Last Year: Never true    Ran Out of Food in the Last Year: Never true  Transportation Needs: No Transportation Needs (01/22/2022)   PRAPARE - Hydrologist (Medical): No    Lack of Transportation (Non-Medical): No  Physical Activity: Inactive (01/22/2022)   Exercise Vital Sign    Days of Exercise per Week: 0 days    Minutes of Exercise per Session: 0 min  Stress: No Stress Concern Present (01/22/2022)   [place]    Feeling of Stress : Only a little  Social Connections: Socially Isolated (01/22/2022)   Social Connection and Isolation Panel [NHANES]    Frequency of Communication with Friends and Family: More than three times a  week    Frequency of Social Gatherings with Friends and Family: Once a week    Attends Religious Services: Never    Marine scientist or Organizations: No    Attends Archivist Meetings: Never    Marital Status: Never married   Additional Social History:    Allergies:   Allergies  Allergen Reactions   Percocet [Oxycodone-Acetaminophen] Diarrhea, Nausea And Vomiting and Nausea Only   Tramadol Hcl Diarrhea, Nausea And Vomiting and Nausea Only   Vicodin [Hydrocodone-Acetaminophen] Diarrhea, Nausea And Vomiting and Nausea Only    Labs:  Results for orders placed or performed during the hospital encounter of 10/24/22 (from the past 48 hour(s))  Basic metabolic panel     Status: Abnormal   Collection Time: 10/24/22 11:42 AM  Result Value Ref Range   Sodium 137 135 - 145 mmol/L   Potassium 2.8 (L) 3.5 - 5.1 mmol/L   Chloride 100 98 - 111 mmol/L   CO2 25 22 - 32 mmol/L   Glucose, Bld 184 (H) 70 - 99 mg/dL    Comment: Glucose reference range applies only to samples taken after fasting for at least 8 hours.   BUN 18 8 - 23 mg/dL   Creatinine, Ser 1.55 (H) 0.44 - 1.00 mg/dL   Calcium 10.3 8.9 - 10.3 mg/dL   GFR, Estimated 36 (L) >60 mL/min    Comment: (NOTE) Calculated using the CKD-EPI Creatinine Equation (2021)    Anion gap 12 5 - 15    Comment: Performed at [facility], [address], [place], [place] [address]  CBC     Status: Abnormal   Collection Time: 10/24/22 11:42 AM  Result Value Ref Range   WBC 6.3 4.0 - 10.5 K/uL   RBC 3.86 (L) 3.87 - 5.11 MIL/uL   Hemoglobin 12.4 12.0 - 15.0 g/dL   HCT 37.1 36.0 - 46.0 %   MCV 96.1 80.0 - 100.0 fL   MCH 32.1 26.0 - 34.0 pg   MCHC 33.4 30.0 - 36.0 g/dL   RDW 13.9 11.5 - 15.5 %   Platelets 271 150 - 400 K/uL   nRBC 0.0 0.0 - 0.2 %    Comment: Performed at [facility], [address], [place], [place] [address]  Magnesium     Status: None   Collection Time: 10/24/22 11:42 AM  Result Value Ref Range  Magnesium 1.8  1.7 - 2.4 mg/dL    Comment: Performed at [facility], [address] [place], [place] [address]  Urinalysis, Routine w reflex microscopic -Urine, Clean Catch     Status: Abnormal   Collection Time: 10/24/22  5:15 PM  Result Value Ref Range   Color, Urine YELLOW YELLOW   APPearance CLOUDY (A) CLEAR   Specific Gravity, Urine 1.029 1.005 - 1.030   pH 6.0 5.0 - 8.0   Glucose, UA NEGATIVE NEGATIVE mg/dL   Hgb urine dipstick SMALL (A) NEGATIVE   Bilirubin Urine NEGATIVE NEGATIVE   Ketones, ur NEGATIVE NEGATIVE mg/dL   Protein, ur >=300 (A) NEGATIVE mg/dL   Nitrite NEGATIVE NEGATIVE   Leukocytes,Ua NEGATIVE NEGATIVE   RBC / HPF 6-10 0 - 5 RBC/hpf   WBC, UA 11-20 0 - 5 WBC/hpf   Bacteria, UA RARE (A) NONE SEEN   Squamous Epithelial / HPF 0-5 0 - 5 /HPF   Trans Epithel, UA 2    Mucus PRESENT    Granular Casts, UA PRESENT     Comment: Performed at [facility], [address], [place], [place] [id number]  Basic metabolic panel     Status: Abnormal   Collection Time: 10/25/22  4:06 AM  Result Value Ref Range   Sodium 140 135 - 145 mmol/L   Potassium 3.7 3.5 - 5.1 mmol/L   Chloride 106 98 - 111 mmol/L   CO2 26 22 - 32 mmol/L   Glucose, Bld 122 (H) 70 - 99 mg/dL    Comment: Glucose reference range applies only to samples taken after fasting for at least 8 hours.   BUN 30 (H) 8 - 23 mg/dL   Creatinine, Ser 2.41 (H) 0.44 - 1.00 mg/dL   Calcium 9.7 8.9 - 10.3 mg/dL   GFR, Estimated 21 (L) >60 mL/min    Comment: (NOTE) Calculated using the CKD-EPI Creatinine Equation (2021)    Anion gap 8 5 - 15    Comment: Performed at [facility], [address] [place], [place] [address]  CBC     Status: Abnormal   Collection Time: 10/25/22  4:06 AM  Result Value Ref Range   WBC 7.2 4.0 - 10.5 K/uL   RBC 3.47 (L) 3.87 - 5.11 MIL/uL   Hemoglobin 11.2 (L) 12.0 - 15.0 g/dL   HCT 33.6 (L) 36.0 - 46.0 %   MCV 96.8 80.0 - 100.0 fL   MCH 32.3 26.0 - 34.0 pg   MCHC 33.3 30.0 - 36.0 g/dL   RDW 14.3 11.5  - 15.5 %   Platelets 268 150 - 400 K/uL   nRBC 0.0 0.0 - 0.2 %    Comment: Performed at [facility], [address], [place], [place] [address]    Medications:  Current Facility-Administered Medications  Medication Dose Route Frequency Provider Last Rate Last Admin   0.9 %  sodium chloride infusion   Intravenous Continuous Johnson, Clanford L, MD 100 mL/hr at 10/25/22 0851 Rate Change at 10/25/22 0851   amLODipine (NORVASC) tablet 5 mg  5 mg Oral Daily Elgergawy, Silver Huguenin, MD   5 mg at 10/25/22 0944   heparin injection 5,000 Units  5,000 Units Subcutaneous Q8H Elgergawy, Silver Huguenin, MD   5,000 Units at 10/25/22 0458   hydrALAZINE (APRESOLINE) injection 5 mg  5 mg Intravenous Q4H PRN Elgergawy, Silver Huguenin, MD       QUEtiapine (SEROQUEL XR) 24 hr tablet 150 mg  150 mg Oral QHS Elgergawy, Silver Huguenin, MD  150 mg at 10/24/22 2045   rosuvastatin (CRESTOR) tablet 40 mg  40 mg Oral Daily Elgergawy, Silver Huguenin, MD   40 mg at 10/25/22 0944   valbenazine (INGREZZA) capsule 40 mg  40 mg Oral Daily Johnson, Clanford L, MD        Musculoskeletal: Strength & Muscle Tone: decreased Gait & Station:  unable to assess Patient leans: N/A          Psychiatric Specialty Exam:  Presentation  General Appearance: No data recorded Eye Contact:No data recorded Speech:No data recorded Speech Volume:No data recorded Handedness:No data recorded  Mood and Affect  Mood:No data recorded Affect:No data recorded  Thought Process  Thought Processes:No data recorded Descriptions of Associations:No data recorded Orientation:No data recorded Thought Content:No data recorded History of Schizophrenia/Schizoaffective disorder:No data recorded Duration of Psychotic Symptoms:No data recorded Hallucinations:No data recorded Ideas of Reference:No data recorded Suicidal Thoughts:No data recorded Homicidal Thoughts:No data recorded  Sensorium  Memory:No data recorded Judgment:No data recorded Insight:No data  recorded  Executive Functions  Concentration:No data recorded Attention Span:No data recorded Recall:No data recorded Fund of Knowledge:No data recorded Language:No data recorded  Psychomotor Activity  Psychomotor Activity:No data recorded  Assets  Assets:No data recorded  Sleep  Sleep:No data recorded   Physical Exam: Physical Exam Vitals and nursing note reviewed.  Constitutional:      Appearance: Normal appearance. She is obese.  HENT:     Head: Normocephalic and atraumatic.     Nose: Nose normal.  Cardiovascular:     Rate and Rhythm: Normal rate.  Pulmonary:     Effort: Pulmonary effort is normal.  Musculoskeletal:        General: Normal range of motion.     Cervical back: Normal range of motion.  Skin:    General: Skin is warm and dry.  Neurological:     Mental Status: She is alert and oriented to person, place, and time.  Psychiatric:        Attention and Perception: Attention and perception normal.        Mood and Affect: Affect normal. Mood is depressed.        Speech: Speech normal.        Behavior: Behavior normal. Behavior is cooperative.        Thought Content: Thought content normal.        Cognition and Memory: Cognition and memory normal.        Judgment: Judgment normal.   Review of Systems  Constitutional: Negative.   HENT: Negative.    Eyes: Negative.   Respiratory: Negative.    Cardiovascular: Negative.   Gastrointestinal: Negative.   Genitourinary: Negative.   Musculoskeletal: Negative.   Skin: Negative.   Neurological: Negative.   Psychiatric/Behavioral:  Positive for depression.    Blood pressure (!) 91/56, pulse 63, temperature 98.2 F (36.8 C), resp. rate 16, height 5' 5"$  (1.651 m), weight 104.3 kg, SpO2 98 %. Body mass index is 38.27 kg/m.  Treatment Plan Summary: Medication management recommend discontinue quetiapine 150 mg nightly.  Continue additional medications including: -Trazodone 50 mg nightly -Ingrezza 40 mg  daily -Haloperidol decanoate 100 mg IM q. 28 days (next dose due approx 11/11/2022)  EKG ordered, pending.  Disposition: No evidence of imminent risk to self or others at present.   Patient does not meet criteria for psychiatric inpatient admission. Supportive therapy provided about ongoing stressors. Discussed crisis plan, support from social network, calling 911, coming to the Emergency Department, and calling Suicide Hotline.  This service was provided via telemedicine using a 2-way, interactive audio and video technology.  Names of all persons participating in this telemedicine service and their role in this encounter. Name: Chiaki Swaine Role: Patient  Name: Beatriz Stallion  Role:PMHNP  Name: Dr Hampton Abbot Role: Psychiatrist  Name: Dr Irwin Brakeman Role: Attending provider    Lucky Rathke, FNP 10/25/2022 10:07 AM

## 2022-10-25 NOTE — BH Assessment (Signed)
TTS attempted to see Pt, will follow up with Pt's nurse.

## 2022-10-25 NOTE — Plan of Care (Signed)
  Problem: Acute Rehab PT Goals(only PT should resolve) Goal: Pt Will Go Supine/Side To Sit Outcome: Progressing Flowsheets (Taken 10/25/2022 1425) Pt will go Supine/Side to Sit:  with supervision  with modified independence Goal: Patient Will Transfer Sit To/From Stand Outcome: Progressing Flowsheets (Taken 10/25/2022 1425) Patient will transfer sit to/from stand:  with supervision  with modified independence Goal: Pt Will Transfer Bed To Chair/Chair To Bed Outcome: Progressing Flowsheets (Taken 10/25/2022 1425) Pt will Transfer Bed to Chair/Chair to Bed: with supervision Goal: Pt Will Ambulate Outcome: Progressing Flowsheets (Taken 10/25/2022 1425) Pt will Ambulate:  75 feet  with supervision  with min guard assist  with rolling walker   2:26 PM, 10/25/22 Lonell Grandchild, MPT Physical Therapist with [facility] [phone] office [phone] mobile phone

## 2022-10-26 LAB — BASIC METABOLIC PANEL
Anion gap: 6 (ref 5–15)
BUN: 23 mg/dL (ref 8–23)
CO2: 23 mmol/L (ref 22–32)
Calcium: 8.9 mg/dL (ref 8.9–10.3)
Chloride: 110 mmol/L (ref 98–111)
Creatinine, Ser: 1.87 mg/dL — ABNORMAL HIGH (ref 0.44–1.00)
GFR, Estimated: 29 mL/min — ABNORMAL LOW (ref >60)
Glucose, Bld: 109 mg/dL — ABNORMAL HIGH (ref 70–99)
Potassium: 3.3 mmol/L — ABNORMAL LOW (ref 3.5–5.1)
Sodium: 139 mmol/L (ref 135–145)

## 2022-10-26 MED ORDER — POTASSIUM CHLORIDE CRYS ER 20 MEQ PO TBCR
30.0000 meq | EXTENDED_RELEASE_TABLET | Freq: Once | ORAL | Status: AC
  Administered 2022-10-26: 30 meq via ORAL
  Filled 2022-10-26: qty 1

## 2022-10-26 NOTE — Progress Notes (Signed)
Patient is complaining of numbness in her hands. MD Wynetta Emery notified.

## 2022-10-26 NOTE — Progress Notes (Signed)
PROGRESS NOTE   Lauren [name]  [id number] DOB: [birth year] DOA: 10/24/2022 PCP: Steele Sizer, MD   Chief Complaint  Patient presents with   Weakness   Level of care: Med-Surg  Brief Admission History:  72 y.o. female, with a known history of meningioma parietal status post meningeal resection , diabetes mellitus, hypertension, brain algia, depression, seizures and CVA, hyperlipidemia, obstructive sleep apnea uses CPAP nightly and schizophrenia  -Presented to ED secondary to weakness, she lives with her cousin for last 3 years, her cousins are intermittently at home, she is taking care of sick family members, and with tremors and dysarthria at baseline, but she has been able to manage with a walker, but she does report she is with progressive weakness, tremors and unsteady gait over last few months, she denies pain, shortness of breath, symptoms became so significant that she could not stand up today, which prompted family to bring her to ED -In ED workup significant for AKI with a creatinine of 1.5, hypokalemia with potassium of 2.8, patient reports poor appetite and oral intake as she cannot cook for herself, and recently has not been able to feed herself as well,.  Urinalysis is pending, this x-ray with no acute findings, MRI brain with no acute intracranial process, stable postoperative meningioma surgery changes, given hypokalemia and AKI [facility] hospitalist consulted to admit   Assessment and Plan:  AKI -from volume depletion and dehydration, most likely from inability to feed herself, will keep on IV fluids, avoid nephrotoxic medications -pt received gadolinium for MRI study on 2/15 -continue IV fluids -ordered bladder scan (11 cc); ordered renal US (no hydronephrosis or mass seen) -renal function improved creatinine down to 1.87   Hypokalemia -repleted, DC telemetry monitoring   Schizophrenia -She has been followed by Olen Pel FNP in [place], appears to be  multiple psych medications including haloperidol, Seroquel and Ingrezza for her schizophrenia, patient denies any recent changes in her psychiatric medications -Unclear if she is on Cogentin or not, but by reviewing the record it does appear it has been stopped on September, so unclear of stopping Cogentin has been contributing to her symptoms(patient is unclear if she has been taking Cogentin or not or if she has ever been on it, but it does appear to be been discontinued once looking in epic in September 2023) -TTS consulted to assist with behavioral health med management - med recs are appreciated; recommendation was to stop the nighttime dose of seroquel.    Progressive weakness, unsteady gait, deconditioning Dysarthria Tremors -Patient with progressive of weakness, tremors, dysarthria, this is most likely side effect of her meds, unclear if she is on Cogentin or not at this point. -Requested TTS consult to assess her meds to see if they can be tapered   History of meningioma -S/p meningeal resection resection -No acute finding on imaging   Diabetes mellitus, type 2  -Will hold metformin and keep an insulin sliding scale  Hyperlipidemia -Continue with statin   Hypertension -Continue with home medications   OSA -continue with CPAP   Obesity Body mass index is 38.27 kg/m.   DVT prophylaxis: [place] heparin Code Status: full  Dispo: waiting for SNF placement by [facility] team  Consultants:  TTS Procedures:   Antimicrobials:     Subjective: Pt LESS agitated and tremulous today, she is eating and drinking and sitting up in a chair, she is agreeable to acute SNF rehab placement  Objective: Vitals:   10/25/22 2317 10/26/22 [id number] 10/26/22 [id number] 10/26/22 1208  BP:  (!) 125/53 138/85 (!) 153/88  Pulse: 79 64  71  Resp: 16 (!) 22  20  Temp:  98.2 F (36.8 C)  97.6 F (36.4 C)  TempSrc:  Oral  Oral  SpO2: 96% 97%  96%  Weight:      Height:        Intake/Output Summary (Last 24  hours) at 10/26/2022 1450 Last data filed at 10/26/2022 1100 Gross per 24 hour  Intake 710 ml  Output --  Net 710 ml   Filed Weights   10/24/22 1122  Weight: 104.3 kg   Examination:  General exam: Appears calm and comfortable intentional tremor seen. Paranoid thoughts.  Respiratory system: Clear to auscultation. Respiratory effort normal. Cardiovascular system: normal S1 & S2 heard. No JVD, murmurs, rubs, gallops or clicks. No pedal edema. Gastrointestinal system: Abdomen is nondistended, soft and nontender. No organomegaly or masses felt. Normal bowel sounds heard. Central nervous system: Alert and oriented. No focal neurological deficits. Extremities: Symmetric 5 x 5 power. Skin: No rashes, lesions or ulcers. Psychiatry: Judgement and insight UTD, has paranoia.  Mood & affect flat.   Data Reviewed: I have personally reviewed following labs and imaging studies  CBC: Recent Labs  Lab 10/24/22 1142 10/25/22 0406  WBC 6.3 7.2  HGB 12.4 11.2*  HCT 37.1 33.6*  MCV 96.1 96.8  PLT 271 [id number]    Basic Metabolic Panel: Recent Labs  Lab 10/24/22 1142 10/25/22 0406 10/26/22 0459  NA 137 140 139  K 2.8* 3.7 3.3*  CL 100 106 110  CO2 25 26 23  $ GLUCOSE 184* 122* 109*  BUN 18 30* 23  CREATININE 1.55* 2.41* 1.87*  CALCIUM 10.3 9.7 8.9  MG 1.8  --   --     CBG: No results for input(s): "GLUCAP" in the last 168 hours.  No results found for this or any previous visit (from the past 240 hour(s)).   Radiology Studies: US RENAL  Result Date: 10/25/2022 CLINICAL DATA:  Acute kidney insufficiency EXAM: RENAL / URINARY TRACT ULTRASOUND COMPLETE COMPARISON:  None Available. FINDINGS: Right Kidney: Renal measurements: 10.4 x 5.5 x 5.6 = volume: 169.27 mL. Echogenicity within normal limits. No mass or hydronephrosis visualized. Prominent right-sided column of Bertin. Left Kidney: Renal measurements: 11.8 x 7.2 x 6.3 = volume: 279.79 mL. Echogenicity within normal limits. No mass or  hydronephrosis visualized. 12 mm anechoic structure in the left kidney consistent with a tiny cyst. Bladder: Appears normal for degree of bladder distention. Other: Limited visualization of the aorta and IVC. IMPRESSION: No collecting system dilatation. Electronically Signed   By: Jill Side M.D.   On: 10/25/2022 15:00   DG Chest Portable 1 View  Result Date: 10/24/2022 CLINICAL DATA:  Altered mental status.  Weakness and tremors EXAM: PORTABLE CHEST 1 VIEW COMPARISON:  X-ray 05/29/2022 FINDINGS: Underinflation. No consolidation, pneumothorax or effusion. Borderline cardiopericardial silhouette with tortuous and ectatic aorta. No edema. Surgical changes along the left side of the mediastinum. IMPRESSION: No active disease. Borderline cardiopericardial silhouette with tortuous and ectatic aorta. Postop changes. Electronically Signed   By: Jill Side M.D.   On: 10/24/2022 15:17    Scheduled Meds:  amLODipine  5 mg Oral Daily   heparin  5,000 Units Subcutaneous Q8H   potassium chloride  30 mEq Oral Once   rosuvastatin  40 mg Oral Daily   traZODone  50 mg Oral QHS   valbenazine  40 mg Oral Daily   Continuous Infusions:  sodium chloride  60 mL/hr at 10/26/22 1039     LOS: 1 day   Time spent: 35 mins  Lauren Kleckley Wynetta Emery, MD How to contact the [facility] Attending or Consulting provider [place] or covering provider during after hours [place], for this patient?  Check the care team in [facility] and look for a) attending/consulting TR[facility] provider listed and b) the [facility] team listed Log into [email or web address] and use [place]'s universal password to access. If you do not have the password, please contact the hospital operator. Locate the [facility] provider you are looking for under [facility] and page to a number that you can be directly reached. If you still have difficulty reaching the provider, please page the [facility] (Director on Call) for the Hospitalists listed on amion for assistance.  10/26/2022, 2:50 PM

## 2022-10-26 NOTE — Progress Notes (Signed)
Telemetry called stating patient had a run of vtach, but did not sustain. Tele also stated that it looked like flutter waves. Vitals are 147/92, hr 64, o2 100, rr 17. MD Wynetta Emery notified.

## 2022-10-27 DIAGNOSIS — E1169 Type 2 diabetes mellitus with other specified complication: Secondary | ICD-10-CM | POA: Diagnosis not present

## 2022-10-27 DIAGNOSIS — R627 Adult failure to thrive: Secondary | ICD-10-CM | POA: Diagnosis not present

## 2022-10-27 DIAGNOSIS — N179 Acute kidney failure, unspecified: Secondary | ICD-10-CM | POA: Diagnosis not present

## 2022-10-27 DIAGNOSIS — E785 Hyperlipidemia, unspecified: Secondary | ICD-10-CM | POA: Diagnosis not present

## 2022-10-27 MED ORDER — BISACODYL 5 MG PO TBEC
5.0000 mg | DELAYED_RELEASE_TABLET | Freq: Every day | ORAL | Status: AC
  Administered 2022-10-27 – 2022-10-29 (×3): 5 mg via ORAL
  Filled 2022-10-27 (×3): qty 1

## 2022-10-27 MED ORDER — ALUM & MAG HYDROXIDE-SIMETH 200-200-20 MG/5ML PO SUSP
15.0000 mL | ORAL | Status: DC | PRN
  Administered 2022-10-27: 15 mL via ORAL
  Filled 2022-10-27: qty 30

## 2022-10-27 MED ORDER — POLYETHYLENE GLYCOL 3350 17 G PO PACK: 17.0000 g | PACK | Freq: Every day | ORAL | Status: DC | PRN

## 2022-10-27 NOTE — Progress Notes (Signed)
Patient c/o numbness in hands and feet. She stated that it started at home and the doctor said they were "going to fix it".

## 2022-10-27 NOTE — Progress Notes (Signed)
PROGRESS NOTE   Lauren [name]  [id number] DOB: Jun 28, 1951 DOA: 10/24/2022 PCP: Steele Sizer, MD   Chief Complaint  Patient presents with   Weakness   Level of care: Med-Surg  Brief Admission History:  72 y.o. female, with a known history of meningioma parietal status post meningeal resection , diabetes mellitus, hypertension, brain algia, depression, seizures and CVA, hyperlipidemia, obstructive sleep apnea uses CPAP nightly and schizophrenia  -Presented to ED secondary to weakness, she lives with her cousin for last 3 years, her cousins are intermittently at home, she is taking care of sick family members, and with tremors and dysarthria at baseline, but she has been able to manage with a walker, but she does report she is with progressive weakness, tremors and unsteady gait over last few months, she denies pain, shortness of breath, symptoms became so significant that she could not stand up today, which prompted family to bring her to ED -In ED workup significant for AKI with a creatinine of 1.5, hypokalemia with potassium of 2.8, patient reports poor appetite and oral intake as she cannot cook for herself, and recently has not been able to feed herself as well,.  Urinalysis is pending, this x-ray with no acute findings, MRI brain with no acute intracranial process, stable postoperative meningioma surgery changes, given hypokalemia and AKI [facility] hospitalist consulted to admit   Assessment and Plan:  AKI -from volume depletion and dehydration, most likely from inability to feed herself, will keep on IV fluids, avoid nephrotoxic medications -pt received gadolinium for MRI study on 2/15 -continue IV fluids -ordered bladder scan (11 cc); ordered renal US (no hydronephrosis or mass seen) -renal function improved creatinine down to 1.87   Hypokalemia -repleted, DC telemetry monitoring   Schizophrenia -She has been followed by Olen Pel FNP in [place], appears to be  multiple psych medications including haloperidol, Seroquel and Ingrezza for her schizophrenia, patient denies any recent changes in her psychiatric medications -Unclear if she is on Cogentin or not, but by reviewing the record it does appear it has been stopped on September, so unclear of stopping Cogentin has been contributing to her symptoms(patient is unclear if she has been taking Cogentin or not or if she has ever been on it, but it does appear to be been discontinued once looking in epic in September 2023) -TTS consulted to assist with behavioral health med management - med recs are appreciated; recommendation was to stop the nighttime dose of seroquel.    Progressive weakness, unsteady gait, deconditioning Dysarthria Tremors -Patient with progressive of weakness, tremors, dysarthria, this is most likely side effect of her meds, unclear if she is on Cogentin or not at this point. -Requested TTS consult to assess her meds to see if they can be tapered   History of meningioma -S/p meningeal resection resection -No acute finding on imaging   Diabetes mellitus, type 2  -Will hold metformin and keep an insulin sliding scale  Hyperlipidemia -Continue with statin   Hypertension -Continue with home medications   OSA -continue with CPAP   Obesity Body mass index is 38.27 kg/m.   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    Objective: Vitals:   10/26/22 1208 10/26/22 2125 10/26/22 2257 10/27/22 0345  BP: (!) 153/88 136/84  [name])  145/86  Pulse: 71 61 63 64  Resp: 20 20 16 18  $ Temp: 97.6 F (36.4 C) 98.2 F (36.8 C)  98.6 F (37 C)  TempSrc: Oral Oral    SpO2: 96% 99% 96% 97%  Weight:      Height:        Intake/Output Summary (Last 24 hours) at 10/27/2022  1148 Last data filed at 10/27/2022 0900 Gross per 24 hour  Intake 590 ml  Output --  Net 590 ml   Filed Weights   10/24/22 1122  Weight: 104.3 kg   Examination:  General exam: Appears calm and comfortable intentional tremor seen. Paranoid thoughts.  Respiratory system: Clear to auscultation. Respiratory effort normal. Cardiovascular system: normal S1 & S2 heard. No JVD, murmurs, rubs, gallops or clicks. No pedal edema. Gastrointestinal system: Abdomen is nondistended, soft and nontender. No organomegaly or masses felt. Normal bowel sounds heard. Central nervous system: Alert and oriented. No focal neurological deficits. Extremities: Symmetric 5 x 5 power. Skin: No rashes, lesions or ulcers. Psychiatry: Judgement and insight UTD, has paranoia.  Mood & affect flat.   Data Reviewed: I have personally reviewed following labs and imaging studies  CBC: Recent Labs  Lab 10/24/22 1142 10/25/22 0406  WBC 6.3 7.2  HGB 12.4 11.2*  HCT 37.1 33.6*  MCV 96.1 96.8  PLT 271 [id number]    Basic Metabolic Panel: Recent Labs  Lab 10/24/22 1142 10/25/22 0406 10/26/22 0459  NA 137 140 139  K 2.8* 3.7 3.3*  CL 100 106 110  CO2 25 26 23  $ GLUCOSE 184* 122* 109*  BUN 18 30* 23  CREATININE 1.55* 2.41* 1.87*  CALCIUM 10.3 9.7 8.9  MG 1.8  --   --     CBG: No results for input(s): "GLUCAP" in the last 168 hours.  No results found for this or any previous visit (from the past 240 hour(s)).   Radiology Studies: US RENAL  Result Date: 10/25/2022 CLINICAL DATA:  Acute kidney insufficiency EXAM: RENAL / URINARY TRACT ULTRASOUND COMPLETE COMPARISON:  None Available. FINDINGS: Right Kidney: Renal measurements: 10.4 x 5.5 x 5.6 = volume: 169.27 mL. Echogenicity within normal limits. No mass or hydronephrosis visualized. Prominent right-sided column of Bertin. Left Kidney: Renal measurements: 11.8 x 7.2 x 6.3 = volume: 279.79 mL. Echogenicity within normal limits. No mass or hydronephrosis  visualized. 12 mm anechoic structure in the left kidney consistent with a tiny cyst. Bladder: Appears normal for degree of bladder distention. Other: Limited visualization of the aorta and IVC. IMPRESSION: No collecting system dilatation. Electronically Signed   By: Jill Side M.D.   On: 10/25/2022 15:00    Scheduled Meds:  amLODipine  5 mg Oral Daily   bisacodyl  5 mg Oral Daily   heparin  5,000 Units Subcutaneous Q8H   rosuvastatin  40 mg Oral Daily   traZODone  50 mg Oral QHS   valbenazine  40 mg Oral Daily   Continuous Infusions:  sodium chloride 50 mL/hr at 10/26/22 1741     LOS: 2 days   Time spent: 35 mins  Aseneth Hack Wynetta Emery, MD How to contact the [facility] Attending or Consulting provider [place] or covering provider during after hours [place], for this patient?  Check the care team in [facility] and look for a) attending/consulting TRH provider listed and b) the [facility] team listed Log into [email or web address] and use [place]'s universal password to access. If you do not have the password, please contact the hospital operator.  Locate the [facility] provider you are looking for under [facility] and page to a number that you can be directly reached. If you still have difficulty reaching the provider, please page the [facility] (Director on Call) for the Hospitalists listed on amion for assistance.  10/27/2022, 11:48 AM

## 2022-10-28 DIAGNOSIS — N179 Acute kidney failure, unspecified: Secondary | ICD-10-CM | POA: Diagnosis not present

## 2022-10-28 DIAGNOSIS — E1169 Type 2 diabetes mellitus with other specified complication: Secondary | ICD-10-CM | POA: Diagnosis not present

## 2022-10-28 DIAGNOSIS — F200 Paranoid schizophrenia: Secondary | ICD-10-CM | POA: Diagnosis not present

## 2022-10-28 DIAGNOSIS — R627 Adult failure to thrive: Secondary | ICD-10-CM | POA: Diagnosis not present

## 2022-10-28 DIAGNOSIS — F419 Anxiety disorder, unspecified: Secondary | ICD-10-CM

## 2022-10-28 MED ORDER — LINAGLIPTIN 5 MG PO TABS: 5.0000 mg | ORAL_TABLET | Freq: Every day | ORAL | Status: AC

## 2022-10-28 MED ORDER — POLYETHYLENE GLYCOL 3350 17 G PO PACK: 17.0000 g | PACK | Freq: Every day | ORAL | 0 refills | Status: AC | PRN

## 2022-10-28 NOTE — Discharge Summary (Addendum)
Physician Discharge Summary  Lauren [name] [id number] DOB: 19-Oct-1950 DOA: 10/24/2022  PCP: Steele Sizer, MD  Admit date: 10/24/2022 Discharge date: 10/29/22 (SNF insisting date be changed to 10/29/22 but patient was actually discharged by MD on 10/28/22 and EMS did not transport patient to facility until 10/29/22) The date that discharge from hospital was billed was 10/28/22).   Admitted From:  Home  Disposition: SNF Rehab   Recommendations for Outpatient Follow-up:  Follow up with PCP in 2 weeks Follow up with neurologist in 2-4 weeks  Please obtain BMP in 1-2 weeks to follow up renal function Please monitor blood sugar at least 3 times per day   Discharge Condition: STABLE   CODE STATUS: FULL DIET: heart health low sodium / carb modified    Brief Hospitalization Summary: Please see all hospital notes, images, labs for full details of the hospitalization. 72 y.o. female, with a known history of meningioma parietal status post meningeal resection , diabetes mellitus, hypertension, brain algia, depression, seizures and CVA, hyperlipidemia, obstructive sleep apnea and schizophrenia  -Presented to ED secondary to weakness, she lives with her cousin for last 3 years, her cousins are intermittently at home, she is taking care of sick family members, and with tremors and dysarthria at baseline, but she has been able to manage with a walker, but she does report she is with progressive weakness, tremors and unsteady gait over last few months, she denies pain, shortness of breath, symptoms became so significant that she could not stand up today, which prompted family to bring her to ED -In ED workup significant for AKI with a creatinine of 1.5, hypokalemia with potassium of 2.8, patient reports poor appetite and oral intake as she cannot cook for herself, and recently has not been able to feed herself as well,.  Urinalysis is pending, this x-ray with no acute findings, MRI brain with no acute  intracranial process, stable postoperative meningioma surgery changes, given hypokalemia and AKI [facility] hospitalist consulted to admit   Assessment and Plan:   AKI -from volume depletion and dehydration, most likely from inability to feed herself, will keep on IV fluids, avoid nephrotoxic medications -pt received gadolinium for MRI study on 2/15 -treated with IV fluids -ordered bladder scan (11 cc); ordered renal US (no hydronephrosis or mass seen) -renal function improved creatinine down to 1.87   Hypokalemia -repleted, DC telemetry monitoring   Schizophrenia -She has been followed by Olen Pel FNP in [place], appears to be multiple psych medications including haloperidol, Seroquel and Ingrezza for her schizophrenia, patient denies any recent changes in her psychiatric medications -Unclear if she is on Cogentin or not, but by reviewing the record it does appear it has been stopped on September, so unclear of stopping Cogentin has been contributing to her symptoms(patient is unclear if she has been taking Cogentin or not or if she has ever been on it, but it does appear to be been discontinued once looking in epic in September 2023) -TTS consulted to assist with behavioral health med management - med recs are appreciated; recommendation was to stop the nighttime dose of seroquel.    Progressive weakness, unsteady gait, deconditioning Dysarthria Tremors -Patient with progressive of weakness, tremors, dysarthria, this is most likely side effect of her meds, unclear if she is on Cogentin or not at this point. -Requested TTS consult to assess her meds to see if they can be tapered  -outpatient referral to neurology - Pt is established with [facility] and advised to follow  up with [facility]   History of meningioma -S/p meningeal resection resection -No acute finding on imaging   Diabetes mellitus, type 2  -Will hold metformin due to CKD - tradjenta 5 mg daily     Hyperlipidemia -Continue with statin   Hypertension -Continue with home medications     Obesity Body mass index is 38.27 kg/m.   Discharge Diagnoses:  Principal Problem:   Acute renal failure (ARF) (HCC) Active Problems:   Anxiety disorder   Essential hypertension   Dyslipidemia associated with type 2 diabetes mellitus (HCC)   Schizophrenia, paranoid ([place])   Meningioma ([place])   Morbid obesity ([place])   Tremor   Seizure-like activity ([place])   Dysarthria   AKI (acute kidney injury) ([place])   Failure to thrive in adult   Discharge Instructions: Discharge Instructions     Ambulatory referral to Neurology   Complete by: As directed    An appointment is requested in approximately: 4 weeks      Allergies as of 10/28/2022       Reactions   Percocet [oxycodone-acetaminophen] Diarrhea, Nausea And Vomiting, Nausea Only   Tramadol Hcl Diarrhea, Nausea And Vomiting, Nausea Only   Vicodin [hydrocodone-acetaminophen] Diarrhea, Nausea And Vomiting, Nausea Only        Medication List     STOP taking these medications    gabapentin 100 MG capsule Commonly known as: NEURONTIN   hydrocortisone 1 % ointment   meloxicam 7.5 MG tablet Commonly known as: MOBIC   metFORMIN 500 MG 24 hr tablet Commonly known as: GLUCOPHAGE-XR   QUEtiapine Fumarate 150 MG 24 hr tablet Commonly known as: SEROQUEL XR       TAKE these medications    amLODipine 5 MG tablet Commonly known as: NORVASC Take 1 tablet (5 mg total) by mouth daily.   haloperidol decanoate 100 MG/ML injection Commonly known as: HALDOL DECANOATE Inject 100 mg into the muscle every 28 (twenty-eight) days.   Ingrezza 40 & 80 MG Cppk Generic drug: Valbenazine Tosylate Take 1 capsule by mouth daily.   linagliptin 5 MG Tabs tablet Commonly known as: TRADJENTA Take 1 tablet (5 mg total) by mouth daily.   polyethylene glycol 17 g packet Commonly known as: MIRALAX / GLYCOLAX Take 17 g by mouth daily as needed for  mild constipation.   QC LO-DOSE ASPIRIN 81 MG tablet Generic drug: aspirin EC Take 81 mg by mouth daily.   rosuvastatin 40 MG tablet Commonly known as: CRESTOR TAKE 1 TABLET BY MOUTH ONCE DAILY.   traZODone 50 MG tablet Commonly known as: DESYREL TAKE (1) TABLET BY MOUTH AT BEDTIME.   valsartan 320 MG tablet Commonly known as: DIOVAN Take 1 tablet (320 mg total) by mouth daily.        Contact information for follow-up providers     [facility] [employer]. Schedule an appointment as soon as possible for a visit in 3 week(s).   Why: Follow Up for neuropathy; Contact information: [place] [address] [phone]              Contact information for after-discharge care     Destination     [facility] Preferred SNF .   Service: Skilled Nursing Contact information: [address] [address] [phone]                    Allergies  Allergen Reactions   Percocet [Oxycodone-Acetaminophen] Diarrhea, Nausea And Vomiting and Nausea  Only   Tramadol Hcl Diarrhea, Nausea And Vomiting and Nausea Only   Vicodin [Hydrocodone-Acetaminophen] Diarrhea, Nausea And Vomiting and Nausea Only   Allergies as of 10/28/2022       Reactions   Percocet [oxycodone-acetaminophen] Diarrhea, Nausea And Vomiting, Nausea Only   Tramadol Hcl Diarrhea, Nausea And Vomiting, Nausea Only   Vicodin [hydrocodone-acetaminophen] Diarrhea, Nausea And Vomiting, Nausea Only        Medication List     STOP taking these medications    gabapentin 100 MG capsule Commonly known as: NEURONTIN   hydrocortisone 1 % ointment   meloxicam 7.5 MG tablet Commonly known as: MOBIC   metFORMIN 500 MG 24 hr tablet Commonly known as: GLUCOPHAGE-XR   QUEtiapine Fumarate 150 MG 24 hr tablet Commonly known as: SEROQUEL XR       TAKE these medications    amLODipine 5 MG tablet Commonly known as: NORVASC Take 1  tablet (5 mg total) by mouth daily.   haloperidol decanoate 100 MG/ML injection Commonly known as: HALDOL DECANOATE Inject 100 mg into the muscle every 28 (twenty-eight) days.   Ingrezza 40 & 80 MG Cppk Generic drug: Valbenazine Tosylate Take 1 capsule by mouth daily.   linagliptin 5 MG Tabs tablet Commonly known as: TRADJENTA Take 1 tablet (5 mg total) by mouth daily.   polyethylene glycol 17 g packet Commonly known as: MIRALAX / GLYCOLAX Take 17 g by mouth daily as needed for mild constipation.   QC LO-DOSE ASPIRIN 81 MG tablet Generic drug: aspirin EC Take 81 mg by mouth daily.   rosuvastatin 40 MG tablet Commonly known as: CRESTOR TAKE 1 TABLET BY MOUTH ONCE DAILY.   traZODone 50 MG tablet Commonly known as: DESYREL TAKE (1) TABLET BY MOUTH AT BEDTIME.   valsartan 320 MG tablet Commonly known as: DIOVAN Take 1 tablet (320 mg total) by mouth daily.        Procedures/Studies: US RENAL  Result Date: 10/25/2022 CLINICAL DATA:  Acute kidney insufficiency EXAM: RENAL / URINARY TRACT ULTRASOUND COMPLETE COMPARISON:  None Available. FINDINGS: Right Kidney: Renal measurements: 10.4 x 5.5 x 5.6 = volume: 169.27 mL. Echogenicity within normal limits. No mass or hydronephrosis visualized. Prominent right-sided column of Bert[place]. Left Kidney: Renal measurements: 11.8 x 7.2 x 6.3 = volume: 279.79 mL. Echogenicity within normal limits. No mass or hydronephrosis visualized. 12 mm anechoic structure in the left kidney consistent with a tiny cyst. Bladder: Appears normal for degree of bladder distention. Other: Limited visualization of the aorta and IVC. IMPRESSION: No collecting system dilatation. Electronically Signed   By: Jill Side M.D.   On: 10/25/2022 15:00   DG Chest Portable 1 View  Result Date: 10/24/2022 CLINICAL DATA:  Altered mental status.  Weakness and tremors EXAM: PORTABLE CHEST 1 VIEW COMPARISON:  X-ray 05/29/2022 FINDINGS: Underinflation. No consolidation,  pneumothorax or effusion. Borderline cardiopericardial silhouette with tortuous and ectatic aorta. No edema. Surgical changes along the left side of the mediastinum. IMPRESSION: No active disease. Borderline cardiopericardial silhouette with tortuous and ectatic aorta. Postop changes. Electronically Signed   By: Jill Side M.D.   On: 10/24/2022 15:17   MR Brain W and Wo Contrast  Result Date: 10/24/2022 CLINICAL DATA:  Neuro deficit, acute, stroke suspected. EXAM: MRI HEAD WITHOUT AND WITH CONTRAST TECHNIQUE: Multiplanar, multiecho pulse sequences of the brain and surrounding structures were obtained without and with intravenous contrast. CONTRAST:  64m GADAVIST GADOBUTROL 1 MMOL/ML IV SOLN COMPARISON:  MRI brain 04/17/2021 and older. FINDINGS: Brain: No acute  infarct or hemorrhage. Stable postoperative changes of left parietal craniotomy for meningioma resection with unchanged 1.5 cm dural-based, homogeneously enhancing mass along the medial resection cavity margin (image 11 series 100), overlying the junction of the left superior frontal gyrus and precentral gyrus with surrounding gliosis (image 30 series 12). Unchanged moderate chronic small-vessel disease. No hydrocephalus. Vascular: Normal flow voids. Skull and upper cervical spine: Prior left parietal craniotomy. Otherwise unremarkable. Sinuses/Orbits: Unremarkable. Other: None. IMPRESSION: 1. No acute intracranial process. 2. Stable postoperative changes of left parietal craniotomy for meningioma resection with unchanged 1.5 cm residual or recurrent tumor along the medial resection cavity margin, overlying the junction of the left superior frontal gyrus and precentral gyrus with surrounding gliosis. Electronically Signed   By: Emmit Alexanders M.D.   On: 10/24/2022 13:35     Subjective: Pt without complaints.  Agreeable to SNF.   Discharge Exam: Vitals:   10/28/22 0833 10/28/22 1438  BP: (!) 163/87 (!) 150/64  Pulse: 71 66  Resp: 18 18  Temp:  97.6 F (36.4 C) 98.3 F (36.8 C)  SpO2: 99% 100%   Vitals:   10/27/22 2345 10/28/22 0405 10/28/22 0833 10/28/22 1438  BP:  131/77 (!) 163/87 (!) 150/64  Pulse: 80 68 71 66  Resp: 16 20 18 18  $ Temp:  98.3 F (36.8 C) 97.6 F (36.4 C) 98.3 F (36.8 C)  TempSrc:  Oral Oral Oral  SpO2: 98% 97% 99% 100%  Weight:      Height:       General: Pt is alert, awake, not in acute distress Cardiovascular: normal S1/S2 +, no rubs, no gallops Respiratory: CTA bilaterally, no wheezing, no rhonchi Abdominal: Soft, NT, ND, bowel sounds + Extremities: no edema, no cyanosis   The results of significant diagnostics from this hospitalization (including imaging, microbiology, ancillary and laboratory) are listed below for reference.     Microbiology: No results found for this or any previous visit (from the past 240 hour(s)).   Labs: BNP (last 3 results) Recent Labs    06/06/22 1759  BNP 0000000   Basic Metabolic Panel: Recent Labs  Lab 10/24/22 1142 10/25/22 0406 10/26/22 0459  NA 137 140 139  K 2.8* 3.7 3.3*  CL 100 106 110  CO2 25 26 23  $ GLUCOSE 184* 122* 109*  BUN 18 30* 23  CREATININE 1.55* 2.41* 1.87*  CALCIUM 10.3 9.7 8.9  MG 1.8  --   --    Liver Function Tests: No results for input(s): "AST", "ALT", "ALKPHOS", "BILITOT", "PROT", "ALBUMIN" in the last 168 hours. No results for input(s): "LIPASE", "AMYLASE" in the last 168 hours. No results for input(s): "AMMONIA" in the last 168 hours. CBC: Recent Labs  Lab 10/24/22 1142 10/25/22 0406  WBC 6.3 7.2  HGB 12.4 11.2*  HCT 37.1 33.6*  MCV 96.1 96.8  PLT 271 268   Cardiac Enzymes: No results for input(s): "CKTOTAL", "CKMB", "CKMBINDEX", "TROPONINI" in the last 168 hours. BNP: Invalid input(s): "POCBNP" CBG: No results for input(s): "GLUCAP" in the last 168 hours. D-Dimer No results for input(s): "DDIMER" in the last 72 hours. Hgb A1c No results for input(s): "HGBA1C" in the last 72 hours. Lipid Profile No  results for input(s): "CHOL", "HDL", "LDLCALC", "TRIG", "CHOLHDL", "LDLDIRECT" in the last 72 hours. Thyroid function studies No results for input(s): "TSH", "T4TOTAL", "T3FREE", "THYROIDAB" in the last 72 hours.  Invalid input(s): "FREET3" Anemia work up No results for input(s): "VITAMINB12", "FOLATE", "FERRITIN", "TIBC", "IRON", "RETICCTPCT" in the last 72 hours.  Urinalysis    Component Value Date/Time   COLORURINE YELLOW 10/24/2022 1715   APPEARANCEUR CLOUDY (A) 10/24/2022 1715   APPEARANCEUR Clear 11/08/2011 0036   LABSPEC 1.029 10/24/2022 1715   LABSPEC 1.026 11/08/2011 0036   PHURINE 6.0 10/24/2022 1715   GLUCOSEU NEGATIVE 10/24/2022 1715   GLUCOSEU Negative 11/08/2011 0036   HGBUR SMALL (A) 10/24/2022 1715   BILIRUBINUR NEGATIVE 10/24/2022 1715   BILIRUBINUR neg 12/09/2017 0942   BILIRUBINUR Negative 11/08/2011 0036   KETONESUR NEGATIVE 10/24/2022 1715   PROTEINUR >=300 (A) 10/24/2022 1715   UROBILINOGEN 0.2 12/09/2017 0942   UROBILINOGEN 0.2 02/26/2012 0938   NITRITE NEGATIVE 10/24/2022 1715   LEUKOCYTESUR NEGATIVE 10/24/2022 1715   LEUKOCYTESUR Negative 11/08/2011 0036   Sepsis Labs Recent Labs  Lab 10/24/22 1142 10/25/22 0406  WBC 6.3 7.2   Microbiology No results found for this or any previous visit (from the past 240 hour(s)).  Time coordinating discharge:   SIGNED:  Irwin Brakeman, MD  [facility] 10/28/2022, 3:49 PM How to contact the [facility] Attending or Consulting provider [place] or covering provider during after hours [place], for this patient?  Check the care team in [facility] and look for a) attending/consulting TRH provider listed and b) the [facility] team listed Log into [email or web address] and use [place]'s universal password to access. If you do not have the password, please contact the hospital operator. Locate the [facility] provider you are looking for under [facility] and page to a number that you can be directly reached. If you still have  difficulty reaching the provider, please page the [facility] (Director on Call) for the Hospitalists listed on amion for assistance.

## 2022-10-28 NOTE — Discharge Instructions (Signed)
IMPORTANT INFORMATION: PAY CLOSE ATTENTION   PHYSICIAN DISCHARGE INSTRUCTIONS  Follow with Primary care provider  Steele Sizer, MD  and other consultants as instructed by your Hospitalist Physician  [place] IF SYMPTOMS COME BACK, WORSEN OR NEW PROBLEM DEVELOPS   Please note: You were cared for by a hospitalist during your hospital stay. Every effort will be made to forward records to your primary care provider.  You can request that your primary care provider send for your hospital records if they have not received them.  Once you are discharged, your primary care physician will handle any further medical issues. Please note that NO REFILLS for any discharge medications will be authorized once you are discharged, as it is imperative that you return to your primary care physician (or establish a relationship with a primary care physician if you do not have one) for your post hospital discharge needs so that they can reassess your need for medications and monitor your lab values.  Please get a complete blood count and chemistry panel checked by your Primary MD at your next visit, and again as instructed by your Primary MD.  Get Medicines reviewed and adjusted: Please take all your medications with you for your next visit with your Primary MD  Laboratory/radiological data: Please request your Primary MD to go over all hospital tests and procedure/radiological results at the follow up, please ask your primary care provider to get all Hospital records sent to his/her office.  In some cases, they will be blood work, cultures and biopsy results pending at the time of your discharge. Please request that your primary care provider follow up on these results.  If you are diabetic, please bring your blood sugar readings with you to your follow up appointment with primary care.    Please call and make your follow up appointments as soon as possible.    Also Note  the following: If you experience worsening of your admission symptoms, develop shortness of breath, life threatening emergency, suicidal or homicidal thoughts you must seek medical attention immediately by calling 911 or calling your MD immediately  if symptoms less severe.  You must read complete instructions/literature along with all the possible adverse reactions/side effects for all the Medicines you take and that have been prescribed to you. Take any new Medicines after you have completely understood and accpet all the possible adverse reactions/side effects.   Do not drive when taking Pain medications or sleeping medications (Benzodiazepines)  Do not take more than prescribed Pain, Sleep and Anxiety Medications. It is not advisable to combine anxiety,sleep and pain medications without talking with your primary care practitioner  Special Instructions: If you have smoked or chewed Tobacco  in the last 2 yrs please stop smoking, stop any regular Alcohol  and or any Recreational drug use.  Wear Seat belts while driving.  Do not drive if taking any narcotic, mind altering or controlled substances or recreational drugs or alcohol.

## 2022-10-28 NOTE — TOC Transition Note (Signed)
Transition of Care [facility]) - CM/SW Discharge Note   Patient Details  Name: Lauren [name] MRN: [id number] Date of Birth: Mar 27, 1951  [facility] CM/SW Contact:  Ihor Gully, LCSW Phone Number: 10/28/2022, 3:04 PM   Clinical Narrative:    Discharge clinicals sent to facility. Nurse to call report. [facility] to transport.   Final next level of care: Skilled Nursing Facility Barriers to Discharge: No Barriers Identified   Patient Goals and CMS Choice      Discharge Placement                Patient chooses bed at: [facility] Patient to be transferred to facility by: [place] Name of family member notified: sister, [name] Patient and family notified of of transfer: 10/28/22  Discharge Plan and Services Additional resources added to the After Visit Summary for                                       Social Determinants of Health (SDOH) Interventions SDOH Screenings   Food Insecurity: No Food Insecurity (01/22/2022)  Housing: Low Risk  (01/22/2022)  Transportation Needs: No Transportation Needs (01/22/2022)  Alcohol Screen: Low Risk  (01/22/2022)  Depression (PHQ2-9): Low Risk  (10/01/2022)  Financial Resource Strain: Low Risk  (01/22/2022)  Physical Activity: Inactive (01/22/2022)  Social Connections: Socially Isolated (01/22/2022)  Stress: No Stress Concern Present (01/22/2022)  Tobacco Use: Low Risk  (10/24/2022)     Readmission Risk Interventions     No data to display

## 2022-10-28 NOTE — Progress Notes (Signed)
Mobility Specialist Progress Note:   10/28/22 1000  Mobility  Activity Ambulated with assistance in hallway  Level of Assistance Contact guard assist, steadying assist  Assistive Device Front wheel walker  Distance Ambulated (ft) 70 ft  Activity Response Tolerated well  Mobility Referral Yes  $Mobility charge 1 Mobility   Pt was agreeable to mobility session. Tolerated session well, MinG required for safety. Pt's gait decreased as time increased. Pt denies dizziness or SOB, SpO2 95% on room air. Returned to chair, call bell in reach, all needs met.   Royetta Crochet Mobility Specialist Please contact via [facility] or  Rehab office at [phone]

## 2022-10-28 NOTE — Care Management Important Message (Signed)
Important Message  Patient Details  Name: Lauren [name] MRN: [id number] Date of Birth: Aug 15, 1951   Medicare Important Message Given:  Yes     Tommy Medal 10/28/2022, 11:32 AM

## 2022-10-28 NOTE — Progress Notes (Signed)
Patient discharged to [facility] called and given to [place] LPN. [facility]  to transport patient to awaiting facility. IV discontinued,catheter intact.

## 2022-10-29 ENCOUNTER — Ambulatory Visit (HOSPITAL_COMMUNITY): Payer: Medicare Other

## 2022-10-29 DIAGNOSIS — N171 Acute kidney failure with acute cortical necrosis: Secondary | ICD-10-CM | POA: Diagnosis not present

## 2022-10-29 DIAGNOSIS — R6889 Other general symptoms and signs: Secondary | ICD-10-CM | POA: Diagnosis not present

## 2022-10-29 DIAGNOSIS — D329 Benign neoplasm of meninges, unspecified: Secondary | ICD-10-CM | POA: Diagnosis not present

## 2022-10-29 DIAGNOSIS — R531 Weakness: Secondary | ICD-10-CM | POA: Diagnosis not present

## 2022-10-29 DIAGNOSIS — M6281 Muscle weakness (generalized): Secondary | ICD-10-CM | POA: Diagnosis not present

## 2022-10-29 DIAGNOSIS — E1169 Type 2 diabetes mellitus with other specified complication: Secondary | ICD-10-CM | POA: Diagnosis not present

## 2022-10-29 DIAGNOSIS — G4700 Insomnia, unspecified: Secondary | ICD-10-CM | POA: Diagnosis not present

## 2022-10-29 DIAGNOSIS — Z743 Need for continuous supervision: Secondary | ICD-10-CM | POA: Diagnosis not present

## 2022-10-29 DIAGNOSIS — I679 Cerebrovascular disease, unspecified: Secondary | ICD-10-CM | POA: Diagnosis not present

## 2022-10-29 DIAGNOSIS — E089 Diabetes mellitus due to underlying condition without complications: Secondary | ICD-10-CM | POA: Diagnosis not present

## 2022-10-29 DIAGNOSIS — M069 Rheumatoid arthritis, unspecified: Secondary | ICD-10-CM | POA: Diagnosis not present

## 2022-10-29 DIAGNOSIS — I10 Essential (primary) hypertension: Secondary | ICD-10-CM | POA: Diagnosis not present

## 2022-10-29 DIAGNOSIS — Z86011 Personal history of benign neoplasm of the brain: Secondary | ICD-10-CM | POA: Diagnosis not present

## 2022-10-29 DIAGNOSIS — M797 Fibromyalgia: Secondary | ICD-10-CM | POA: Diagnosis not present

## 2022-10-29 DIAGNOSIS — E876 Hypokalemia: Secondary | ICD-10-CM | POA: Diagnosis not present

## 2022-10-29 DIAGNOSIS — I69922 Dysarthria following unspecified cerebrovascular disease: Secondary | ICD-10-CM | POA: Diagnosis not present

## 2022-10-29 DIAGNOSIS — G4733 Obstructive sleep apnea (adult) (pediatric): Secondary | ICD-10-CM | POA: Diagnosis not present

## 2022-10-29 DIAGNOSIS — E785 Hyperlipidemia, unspecified: Secondary | ICD-10-CM | POA: Diagnosis not present

## 2022-10-29 DIAGNOSIS — R2689 Other abnormalities of gait and mobility: Secondary | ICD-10-CM | POA: Diagnosis not present

## 2022-10-29 DIAGNOSIS — R627 Adult failure to thrive: Secondary | ICD-10-CM | POA: Diagnosis not present

## 2022-10-29 DIAGNOSIS — D472 Monoclonal gammopathy: Secondary | ICD-10-CM | POA: Diagnosis not present

## 2022-10-29 DIAGNOSIS — Z741 Need for assistance with personal care: Secondary | ICD-10-CM | POA: Diagnosis not present

## 2022-10-29 DIAGNOSIS — M1711 Unilateral primary osteoarthritis, right knee: Secondary | ICD-10-CM | POA: Diagnosis not present

## 2022-10-29 DIAGNOSIS — R638 Other symptoms and signs concerning food and fluid intake: Secondary | ICD-10-CM | POA: Diagnosis not present

## 2022-10-29 DIAGNOSIS — G2401 Drug induced subacute dyskinesia: Secondary | ICD-10-CM | POA: Diagnosis not present

## 2022-10-29 DIAGNOSIS — I6930 Unspecified sequelae of cerebral infarction: Secondary | ICD-10-CM | POA: Diagnosis not present

## 2022-10-29 DIAGNOSIS — G629 Polyneuropathy, unspecified: Secondary | ICD-10-CM | POA: Diagnosis not present

## 2022-10-29 DIAGNOSIS — R251 Tremor, unspecified: Secondary | ICD-10-CM | POA: Diagnosis not present

## 2022-10-29 NOTE — Progress Notes (Addendum)
10/29/2022 10:35 AM  No Charge   Pt was discharged 10/28/22.  Pt was seen and examined and remains stable to discharge to SNF.  Vitals:   10/29/22 0338 10/29/22 0827  BP: 134/76 (!) 153/80  Pulse: 63 68  Resp: 18 18  Temp: 98.5 F (36.9 C) 98.7 F (37.1 C)  SpO2: 100% 99%   Murvin Natal, MD How to contact the [facility] Attending or Consulting provider [place] or covering provider during after hours [place], for this patient?  Check the care team in [facility] and look for a) attending/consulting TRH provider listed and b) the [facility] team listed Log into [email or web address] and use [place]'s universal password to access. If you do not have the password, please contact the hospital operator. Locate the [facility] provider you are looking for under [facility] and page to a number that you can be directly reached. If you still have difficulty reaching the provider, please page the [facility] (Director on Call) for the Hospitalists listed on amion for assistance.

## 2022-10-29 NOTE — TOC Progression Note (Signed)
Transition of Care [facility]) - Progression Note    Patient Details  Name: Lauren [name] MRN: [id number] Date of Birth: July 21, 1951  Transition of Care [facility]) CM/SW Contact  Shade Flood, LCSW Phone Number: 10/29/2022, 10:39 AM  Clinical Narrative:     EMS was not able to transport pt yesterday. Spoke with Juliann Pulse at [facility] to update. They can accept pt today. Updated clinical sent electronically. Confirmed with EMS that pt is on the list for transport today.  RN to re-call report to the SNF.  No other TOC needs for dc.  Expected Discharge Plan: [place] Barriers to Discharge: No Barriers Identified  Expected Discharge Plan and Services       Living arrangements for the past 2 months: Single Family Home Expected Discharge Date: 10/28/22                                     Social Determinants of Health (SDOH) Interventions SDOH Screenings   Food Insecurity: No Food Insecurity (01/22/2022)  Housing: Low Risk  (01/22/2022)  Transportation Needs: No Transportation Needs (01/22/2022)  Alcohol Screen: Low Risk  (01/22/2022)  Depression (PHQ2-9): Low Risk  (10/01/2022)  Financial Resource Strain: Low Risk  (01/22/2022)  Physical Activity: Inactive (01/22/2022)  Social Connections: Socially Isolated (01/22/2022)  Stress: No Stress Concern Present (01/22/2022)  Tobacco Use: Low Risk  (10/24/2022)    Readmission Risk Interventions     No data to display

## 2022-10-29 NOTE — Progress Notes (Signed)
Patient was discharged today to [facility] given to [place]. Transported by [facility].

## 2022-10-29 NOTE — Progress Notes (Signed)
Mobility Specialist Progress Note:    10/29/22 0955  Mobility  Activity Ambulated with assistance in hallway  Level of Assistance Contact guard assist, steadying assist  Assistive Device Front wheel walker  Distance Ambulated (ft) 140 ft  Activity Response Tolerated well  Mobility Referral Yes  $Mobility charge 1 Mobility   Pt was agreeable to mobility session, tolerated well. No c/o dizziness or SOB.  SpO2 97% on RA during ambulation, SpO2 94% on RA post mobility session. Returned pt to chair with all needs met, call bell in reach.   Royetta Crochet Mobility Specialist Please contact via Solicitor or  Rehab office at [phone]

## 2022-10-30 DIAGNOSIS — R251 Tremor, unspecified: Secondary | ICD-10-CM | POA: Diagnosis not present

## 2022-10-30 DIAGNOSIS — G629 Polyneuropathy, unspecified: Secondary | ICD-10-CM | POA: Diagnosis not present

## 2022-10-31 DIAGNOSIS — E785 Hyperlipidemia, unspecified: Secondary | ICD-10-CM | POA: Diagnosis not present

## 2022-10-31 DIAGNOSIS — M797 Fibromyalgia: Secondary | ICD-10-CM | POA: Diagnosis not present

## 2022-10-31 DIAGNOSIS — I10 Essential (primary) hypertension: Secondary | ICD-10-CM | POA: Diagnosis not present

## 2022-10-31 DIAGNOSIS — R251 Tremor, unspecified: Secondary | ICD-10-CM | POA: Diagnosis not present

## 2022-10-31 DIAGNOSIS — G2401 Drug induced subacute dyskinesia: Secondary | ICD-10-CM | POA: Diagnosis not present

## 2022-10-31 DIAGNOSIS — G629 Polyneuropathy, unspecified: Secondary | ICD-10-CM | POA: Diagnosis not present

## 2022-10-31 DIAGNOSIS — E089 Diabetes mellitus due to underlying condition without complications: Secondary | ICD-10-CM | POA: Diagnosis not present

## 2022-10-31 DIAGNOSIS — R627 Adult failure to thrive: Secondary | ICD-10-CM | POA: Diagnosis not present

## 2022-10-31 DIAGNOSIS — I69922 Dysarthria following unspecified cerebrovascular disease: Secondary | ICD-10-CM | POA: Diagnosis not present

## 2022-11-05 DIAGNOSIS — Z741 Need for assistance with personal care: Secondary | ICD-10-CM | POA: Diagnosis not present

## 2022-11-05 DIAGNOSIS — R251 Tremor, unspecified: Secondary | ICD-10-CM | POA: Diagnosis not present

## 2022-11-05 DIAGNOSIS — G629 Polyneuropathy, unspecified: Secondary | ICD-10-CM | POA: Diagnosis not present

## 2022-11-06 DIAGNOSIS — G629 Polyneuropathy, unspecified: Secondary | ICD-10-CM | POA: Diagnosis not present

## 2022-11-06 DIAGNOSIS — R251 Tremor, unspecified: Secondary | ICD-10-CM | POA: Diagnosis not present

## 2022-11-07 ENCOUNTER — Telehealth: Payer: Self-pay | Admitting: Family Medicine

## 2022-11-07 ENCOUNTER — Encounter: Payer: Self-pay | Admitting: Radiology

## 2022-11-07 DIAGNOSIS — R627 Adult failure to thrive: Secondary | ICD-10-CM | POA: Diagnosis not present

## 2022-11-07 DIAGNOSIS — Z741 Need for assistance with personal care: Secondary | ICD-10-CM | POA: Diagnosis not present

## 2022-11-07 DIAGNOSIS — G629 Polyneuropathy, unspecified: Secondary | ICD-10-CM | POA: Diagnosis not present

## 2022-11-07 NOTE — Telephone Encounter (Signed)
Called patient to schedule Medicare Annual Wellness Visit (AWV). No voicemail available to leave a message.  Last date of AWV: 01/23/2023  Please schedule an appointment at any time with [facility].  If any questions, please contact me at [phone].  Thank you ,  [place] Direct Dial: [phone]

## 2022-11-13 DIAGNOSIS — R627 Adult failure to thrive: Secondary | ICD-10-CM | POA: Diagnosis not present

## 2022-11-13 DIAGNOSIS — J3489 Other specified disorders of nose and nasal sinuses: Secondary | ICD-10-CM | POA: Diagnosis not present

## 2022-11-13 DIAGNOSIS — G629 Polyneuropathy, unspecified: Secondary | ICD-10-CM | POA: Diagnosis not present

## 2022-11-14 DIAGNOSIS — M25561 Pain in right knee: Secondary | ICD-10-CM | POA: Diagnosis not present

## 2022-11-14 DIAGNOSIS — G629 Polyneuropathy, unspecified: Secondary | ICD-10-CM | POA: Diagnosis not present

## 2022-11-19 DIAGNOSIS — R569 Unspecified convulsions: Secondary | ICD-10-CM | POA: Diagnosis not present

## 2022-11-19 DIAGNOSIS — R251 Tremor, unspecified: Secondary | ICD-10-CM | POA: Diagnosis not present

## 2022-11-19 DIAGNOSIS — M25561 Pain in right knee: Secondary | ICD-10-CM | POA: Diagnosis not present

## 2022-11-19 DIAGNOSIS — M25562 Pain in left knee: Secondary | ICD-10-CM | POA: Diagnosis not present

## 2022-11-19 DIAGNOSIS — R200 Anesthesia of skin: Secondary | ICD-10-CM | POA: Diagnosis not present

## 2022-11-19 DIAGNOSIS — D329 Benign neoplasm of meninges, unspecified: Secondary | ICD-10-CM | POA: Diagnosis not present

## 2022-11-19 DIAGNOSIS — R202 Paresthesia of skin: Secondary | ICD-10-CM | POA: Diagnosis not present

## 2022-11-20 DIAGNOSIS — R600 Localized edema: Secondary | ICD-10-CM | POA: Diagnosis not present

## 2022-11-20 DIAGNOSIS — G629 Polyneuropathy, unspecified: Secondary | ICD-10-CM | POA: Diagnosis not present

## 2022-11-21 DIAGNOSIS — M6281 Muscle weakness (generalized): Secondary | ICD-10-CM | POA: Diagnosis not present

## 2022-11-21 DIAGNOSIS — R2689 Other abnormalities of gait and mobility: Secondary | ICD-10-CM | POA: Diagnosis not present

## 2022-11-21 DIAGNOSIS — N171 Acute kidney failure with acute cortical necrosis: Secondary | ICD-10-CM | POA: Diagnosis not present

## 2022-11-22 DIAGNOSIS — M6281 Muscle weakness (generalized): Secondary | ICD-10-CM | POA: Diagnosis not present

## 2022-11-22 DIAGNOSIS — N171 Acute kidney failure with acute cortical necrosis: Secondary | ICD-10-CM | POA: Diagnosis not present

## 2022-11-22 DIAGNOSIS — R2689 Other abnormalities of gait and mobility: Secondary | ICD-10-CM | POA: Diagnosis not present

## 2022-11-23 DIAGNOSIS — N171 Acute kidney failure with acute cortical necrosis: Secondary | ICD-10-CM | POA: Diagnosis not present

## 2022-11-23 DIAGNOSIS — R2689 Other abnormalities of gait and mobility: Secondary | ICD-10-CM | POA: Diagnosis not present

## 2022-11-23 DIAGNOSIS — M6281 Muscle weakness (generalized): Secondary | ICD-10-CM | POA: Diagnosis not present

## 2022-11-24 DIAGNOSIS — R2689 Other abnormalities of gait and mobility: Secondary | ICD-10-CM | POA: Diagnosis not present

## 2022-11-24 DIAGNOSIS — M6281 Muscle weakness (generalized): Secondary | ICD-10-CM | POA: Diagnosis not present

## 2022-11-24 DIAGNOSIS — N171 Acute kidney failure with acute cortical necrosis: Secondary | ICD-10-CM | POA: Diagnosis not present

## 2022-11-25 DIAGNOSIS — N171 Acute kidney failure with acute cortical necrosis: Secondary | ICD-10-CM | POA: Diagnosis not present

## 2022-11-25 DIAGNOSIS — M6281 Muscle weakness (generalized): Secondary | ICD-10-CM | POA: Diagnosis not present

## 2022-11-25 DIAGNOSIS — R2689 Other abnormalities of gait and mobility: Secondary | ICD-10-CM | POA: Diagnosis not present

## 2022-11-27 ENCOUNTER — Ambulatory Visit: Payer: Medicare Other | Admitting: Family Medicine

## 2022-11-28 DIAGNOSIS — N171 Acute kidney failure with acute cortical necrosis: Secondary | ICD-10-CM | POA: Diagnosis not present

## 2022-11-28 DIAGNOSIS — R2689 Other abnormalities of gait and mobility: Secondary | ICD-10-CM | POA: Diagnosis not present

## 2022-11-28 DIAGNOSIS — M6281 Muscle weakness (generalized): Secondary | ICD-10-CM | POA: Diagnosis not present

## 2022-11-29 DIAGNOSIS — R2689 Other abnormalities of gait and mobility: Secondary | ICD-10-CM | POA: Diagnosis not present

## 2022-11-29 DIAGNOSIS — N171 Acute kidney failure with acute cortical necrosis: Secondary | ICD-10-CM | POA: Diagnosis not present

## 2022-11-29 DIAGNOSIS — M25511 Pain in right shoulder: Secondary | ICD-10-CM | POA: Diagnosis not present

## 2022-11-29 DIAGNOSIS — W19XXXA Unspecified fall, initial encounter: Secondary | ICD-10-CM | POA: Diagnosis not present

## 2022-11-29 DIAGNOSIS — M6281 Muscle weakness (generalized): Secondary | ICD-10-CM | POA: Diagnosis not present

## 2022-11-30 DIAGNOSIS — R2689 Other abnormalities of gait and mobility: Secondary | ICD-10-CM | POA: Diagnosis not present

## 2022-11-30 DIAGNOSIS — N171 Acute kidney failure with acute cortical necrosis: Secondary | ICD-10-CM | POA: Diagnosis not present

## 2022-11-30 DIAGNOSIS — M6281 Muscle weakness (generalized): Secondary | ICD-10-CM | POA: Diagnosis not present

## 2022-12-02 DIAGNOSIS — G629 Polyneuropathy, unspecified: Secondary | ICD-10-CM | POA: Diagnosis not present

## 2022-12-02 DIAGNOSIS — M6281 Muscle weakness (generalized): Secondary | ICD-10-CM | POA: Diagnosis not present

## 2022-12-02 DIAGNOSIS — N171 Acute kidney failure with acute cortical necrosis: Secondary | ICD-10-CM | POA: Diagnosis not present

## 2022-12-02 DIAGNOSIS — J302 Other seasonal allergic rhinitis: Secondary | ICD-10-CM | POA: Diagnosis not present

## 2022-12-02 DIAGNOSIS — R2689 Other abnormalities of gait and mobility: Secondary | ICD-10-CM | POA: Diagnosis not present

## 2022-12-03 DIAGNOSIS — G629 Polyneuropathy, unspecified: Secondary | ICD-10-CM | POA: Diagnosis not present

## 2022-12-03 DIAGNOSIS — M6281 Muscle weakness (generalized): Secondary | ICD-10-CM | POA: Diagnosis not present

## 2022-12-03 DIAGNOSIS — N171 Acute kidney failure with acute cortical necrosis: Secondary | ICD-10-CM | POA: Diagnosis not present

## 2022-12-03 DIAGNOSIS — R2689 Other abnormalities of gait and mobility: Secondary | ICD-10-CM | POA: Diagnosis not present

## 2022-12-04 DIAGNOSIS — G629 Polyneuropathy, unspecified: Secondary | ICD-10-CM | POA: Diagnosis not present

## 2022-12-04 DIAGNOSIS — J069 Acute upper respiratory infection, unspecified: Secondary | ICD-10-CM | POA: Diagnosis not present

## 2022-12-04 DIAGNOSIS — R2689 Other abnormalities of gait and mobility: Secondary | ICD-10-CM | POA: Diagnosis not present

## 2022-12-04 DIAGNOSIS — M6281 Muscle weakness (generalized): Secondary | ICD-10-CM | POA: Diagnosis not present

## 2022-12-04 DIAGNOSIS — N171 Acute kidney failure with acute cortical necrosis: Secondary | ICD-10-CM | POA: Diagnosis not present

## 2022-12-04 DIAGNOSIS — J302 Other seasonal allergic rhinitis: Secondary | ICD-10-CM | POA: Diagnosis not present

## 2022-12-05 DIAGNOSIS — G4700 Insomnia, unspecified: Secondary | ICD-10-CM | POA: Diagnosis not present

## 2022-12-05 DIAGNOSIS — J069 Acute upper respiratory infection, unspecified: Secondary | ICD-10-CM | POA: Diagnosis not present

## 2022-12-05 DIAGNOSIS — N171 Acute kidney failure with acute cortical necrosis: Secondary | ICD-10-CM | POA: Diagnosis not present

## 2022-12-05 DIAGNOSIS — M6281 Muscle weakness (generalized): Secondary | ICD-10-CM | POA: Diagnosis not present

## 2022-12-05 DIAGNOSIS — J302 Other seasonal allergic rhinitis: Secondary | ICD-10-CM | POA: Diagnosis not present

## 2022-12-05 DIAGNOSIS — R2689 Other abnormalities of gait and mobility: Secondary | ICD-10-CM | POA: Diagnosis not present

## 2022-12-05 DIAGNOSIS — G629 Polyneuropathy, unspecified: Secondary | ICD-10-CM | POA: Diagnosis not present

## 2022-12-06 DIAGNOSIS — N171 Acute kidney failure with acute cortical necrosis: Secondary | ICD-10-CM | POA: Diagnosis not present

## 2022-12-06 DIAGNOSIS — M6281 Muscle weakness (generalized): Secondary | ICD-10-CM | POA: Diagnosis not present

## 2022-12-06 DIAGNOSIS — R2689 Other abnormalities of gait and mobility: Secondary | ICD-10-CM | POA: Diagnosis not present

## 2022-12-09 DIAGNOSIS — J069 Acute upper respiratory infection, unspecified: Secondary | ICD-10-CM | POA: Diagnosis not present

## 2022-12-09 DIAGNOSIS — N171 Acute kidney failure with acute cortical necrosis: Secondary | ICD-10-CM | POA: Diagnosis not present

## 2022-12-09 DIAGNOSIS — M6281 Muscle weakness (generalized): Secondary | ICD-10-CM | POA: Diagnosis not present

## 2022-12-09 DIAGNOSIS — R051 Acute cough: Secondary | ICD-10-CM | POA: Diagnosis not present

## 2022-12-09 DIAGNOSIS — R2689 Other abnormalities of gait and mobility: Secondary | ICD-10-CM | POA: Diagnosis not present

## 2022-12-10 DIAGNOSIS — N171 Acute kidney failure with acute cortical necrosis: Secondary | ICD-10-CM | POA: Diagnosis not present

## 2022-12-10 DIAGNOSIS — M6281 Muscle weakness (generalized): Secondary | ICD-10-CM | POA: Diagnosis not present

## 2022-12-10 DIAGNOSIS — E1140 Type 2 diabetes mellitus with diabetic neuropathy, unspecified: Secondary | ICD-10-CM | POA: Diagnosis not present

## 2022-12-10 DIAGNOSIS — R2689 Other abnormalities of gait and mobility: Secondary | ICD-10-CM | POA: Diagnosis not present

## 2022-12-10 DIAGNOSIS — J069 Acute upper respiratory infection, unspecified: Secondary | ICD-10-CM | POA: Diagnosis not present

## 2022-12-11 DIAGNOSIS — J302 Other seasonal allergic rhinitis: Secondary | ICD-10-CM | POA: Diagnosis not present

## 2022-12-11 DIAGNOSIS — R2689 Other abnormalities of gait and mobility: Secondary | ICD-10-CM | POA: Diagnosis not present

## 2022-12-11 DIAGNOSIS — J069 Acute upper respiratory infection, unspecified: Secondary | ICD-10-CM | POA: Diagnosis not present

## 2022-12-11 DIAGNOSIS — N171 Acute kidney failure with acute cortical necrosis: Secondary | ICD-10-CM | POA: Diagnosis not present

## 2022-12-11 DIAGNOSIS — M6281 Muscle weakness (generalized): Secondary | ICD-10-CM | POA: Diagnosis not present

## 2022-12-12 DIAGNOSIS — R2689 Other abnormalities of gait and mobility: Secondary | ICD-10-CM | POA: Diagnosis not present

## 2022-12-12 DIAGNOSIS — M6281 Muscle weakness (generalized): Secondary | ICD-10-CM | POA: Diagnosis not present

## 2022-12-12 DIAGNOSIS — N171 Acute kidney failure with acute cortical necrosis: Secondary | ICD-10-CM | POA: Diagnosis not present

## 2022-12-13 DIAGNOSIS — E1140 Type 2 diabetes mellitus with diabetic neuropathy, unspecified: Secondary | ICD-10-CM | POA: Diagnosis not present

## 2022-12-14 DIAGNOSIS — M6281 Muscle weakness (generalized): Secondary | ICD-10-CM | POA: Diagnosis not present

## 2022-12-14 DIAGNOSIS — N171 Acute kidney failure with acute cortical necrosis: Secondary | ICD-10-CM | POA: Diagnosis not present

## 2022-12-14 DIAGNOSIS — R2689 Other abnormalities of gait and mobility: Secondary | ICD-10-CM | POA: Diagnosis not present

## 2022-12-16 DIAGNOSIS — Z79899 Other long term (current) drug therapy: Secondary | ICD-10-CM | POA: Diagnosis not present

## 2022-12-16 DIAGNOSIS — R2689 Other abnormalities of gait and mobility: Secondary | ICD-10-CM | POA: Diagnosis not present

## 2022-12-16 DIAGNOSIS — M6281 Muscle weakness (generalized): Secondary | ICD-10-CM | POA: Diagnosis not present

## 2022-12-16 DIAGNOSIS — J069 Acute upper respiratory infection, unspecified: Secondary | ICD-10-CM | POA: Diagnosis not present

## 2022-12-16 DIAGNOSIS — R051 Acute cough: Secondary | ICD-10-CM | POA: Diagnosis not present

## 2022-12-16 DIAGNOSIS — N171 Acute kidney failure with acute cortical necrosis: Secondary | ICD-10-CM | POA: Diagnosis not present

## 2022-12-17 DIAGNOSIS — R2689 Other abnormalities of gait and mobility: Secondary | ICD-10-CM | POA: Diagnosis not present

## 2022-12-17 DIAGNOSIS — M6281 Muscle weakness (generalized): Secondary | ICD-10-CM | POA: Diagnosis not present

## 2022-12-17 DIAGNOSIS — N171 Acute kidney failure with acute cortical necrosis: Secondary | ICD-10-CM | POA: Diagnosis not present

## 2022-12-18 DIAGNOSIS — H5789 Other specified disorders of eye and adnexa: Secondary | ICD-10-CM | POA: Diagnosis not present

## 2022-12-18 DIAGNOSIS — R2689 Other abnormalities of gait and mobility: Secondary | ICD-10-CM | POA: Diagnosis not present

## 2022-12-18 DIAGNOSIS — N171 Acute kidney failure with acute cortical necrosis: Secondary | ICD-10-CM | POA: Diagnosis not present

## 2022-12-18 DIAGNOSIS — M6281 Muscle weakness (generalized): Secondary | ICD-10-CM | POA: Diagnosis not present

## 2022-12-19 DIAGNOSIS — H5789 Other specified disorders of eye and adnexa: Secondary | ICD-10-CM | POA: Diagnosis not present

## 2022-12-26 DIAGNOSIS — G4700 Insomnia, unspecified: Secondary | ICD-10-CM | POA: Diagnosis not present

## 2022-12-26 DIAGNOSIS — G2401 Drug induced subacute dyskinesia: Secondary | ICD-10-CM | POA: Diagnosis not present

## 2022-12-27 DIAGNOSIS — G629 Polyneuropathy, unspecified: Secondary | ICD-10-CM | POA: Diagnosis not present

## 2022-12-27 DIAGNOSIS — R251 Tremor, unspecified: Secondary | ICD-10-CM | POA: Diagnosis not present

## 2022-12-31 DIAGNOSIS — M25562 Pain in left knee: Secondary | ICD-10-CM | POA: Diagnosis not present

## 2022-12-31 DIAGNOSIS — M25561 Pain in right knee: Secondary | ICD-10-CM | POA: Diagnosis not present

## 2023-01-02 DIAGNOSIS — R569 Unspecified convulsions: Secondary | ICD-10-CM | POA: Diagnosis not present

## 2023-01-03 DIAGNOSIS — R251 Tremor, unspecified: Secondary | ICD-10-CM | POA: Diagnosis not present

## 2023-01-03 DIAGNOSIS — G629 Polyneuropathy, unspecified: Secondary | ICD-10-CM | POA: Diagnosis not present

## 2023-01-08 DIAGNOSIS — K117 Disturbances of salivary secretion: Secondary | ICD-10-CM | POA: Diagnosis not present

## 2023-01-08 DIAGNOSIS — R251 Tremor, unspecified: Secondary | ICD-10-CM | POA: Diagnosis not present

## 2023-01-13 DIAGNOSIS — G629 Polyneuropathy, unspecified: Secondary | ICD-10-CM | POA: Diagnosis not present

## 2023-01-13 DIAGNOSIS — R251 Tremor, unspecified: Secondary | ICD-10-CM | POA: Diagnosis not present

## 2023-01-13 NOTE — Progress Notes (Deleted)
Name: Lauren [name]   MRN: [id number]    DOB: Oct 05, 1950   Date:01/13/2023       Progress Note  Subjective  Chief Complaint  Follow Up  HPI  SOB/Tachypenia: seen by Dr. Karna Christmas, he felt like symptoms likely from extra pyramidal effects. Her breathing has been improving, she  has intermittent tachypnea. No wheezing or cough . She uses a paper bag prn when feels more SOB. 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   She is now living in [place] with her cousin - Billie Ruddy  who manages her money and continues to pay her bills. She denies visual or auditory hallucinations She has medications pre-packaged and is compliant with oral medications and haldol monthly . Psychiatrist recently added the Ingrezza. Patient states she is very stressed, she states her cousin's mother has been sick and she has not been at home lately, patient feels like her cousin is a Chart[employer] loss adjuster and asks her to keep cleaning her house, she is also not allowed to leave the house because her cousin is worried about the land-lord raise the rent. She is very upset and stressed out. She states he is feeling exploited since she has to pay for rent and also buy food and cannot leave the house .Discussed talking to a Child psychotherapist. She is trying to go to [place] [facility] , Harvin Hazel is helping her but she is unable to fill out the form on her own.    Recurrence of Meningioma: resection done in 2012 with developed of left side numbness, she went to [facility] and CT showed recurrence 01/2018 . She saw Dr. Adriana Simas but she did not want to have repeat  surgery therefore seen by Dr. Rushie Chestnut, s/p radiation.  She denies headaches, nausea, vomiting, but states she states tremors are intermittent now. History of seizures, taking Keppra and no symptoms in a long time. She saw Dr. Adriana Simas this Summer 22 had another MRI brain that showed meniogioma is stable in size, follow up in 2 years She has seen ophthalmologist she states she has some visual problems but cannot explain what it is .   MRI brain done 04/17/2021 1. Stable appearance and size of 1.6 cm left frontal convexity meningioma and subjacent vasogenic edema. 2. Stable chronic white matter disease.   Aorta atherosclerosis: found on  CT chest done Nov 2018. She is on statin and we will Last labs done Nov 2022 and at goal    DMII:  she is off Metformin and A1C has gone up from 5.9 % to 6.2 % , 6.4 %, today is up to 6.7 % , she asked to resume her Metformin   Eye exam is up to date. She has polyphagia, polydipsia and polyuria. On statin therapy and valsartan, history of microalbuminuria, but last level was normal, she is due for repeat urine micro today   HTN: she is on Valsartan and tolerating it well. Her bp has spiked, went to [facility] and also seen by Sheliah Mends, she is compliant with medications, she states she has  been more stressed and trying to decide if she will move out of her niece's house.   Morbid obesity: BMI above 35 with co-morbidities such as DM, HTN and knee pain, discussed importance of portion control and healthier diet . Weight is stable.   OSA: she has not been using CPAP   Right Knee pain: she states over the past few months she noticed intermittent anterior right knee pain, it is sharp or aching , it is  intermittent, she has been using a cane. She has some grinding on her right knee, but no recent erythema or increase in warmth She states the pain started after a fall at home, currently the pain is 0/10 but can go up to 8/10. She had left knee replaced in the past. She was already seen by  sports medicine , Dr. Romeo Apple on 01/15/2021 , she was given meloxicam and advised to had a  steroid injections, pain level today is 5/10 and stable   Patient Active Problem List   Diagnosis Date Noted   Failure to thrive in adult 10/27/2022   Acute renal failure (ARF) (HCC) 10/25/2022   AKI (acute kidney injury) (HCC) 10/24/2022   Dysarthria 07/25/2022   Seborrhea of face 01/22/2022   IgA monoclonal gammopathy of uncertain significance 04/26/2019   SS-A antibody positive 04/15/2019   Seizure-like activity (HCC) 12/29/2018   Seizures (HCC) 11/24/2018   Paresthesias 11/22/2018   Mouth dryness 10/12/2018   Hx of resection of meningioma 07/21/2018   Tremor 07/21/2018   Morbid obesity (HCC) 12/09/2017   Meningioma (HCC) 11/25/2017   Atherosclerosis of aorta (HCC) 11/25/2017   Calcification of coronary artery 11/25/2017   Schizophrenia, paranoid (HCC) 11/04/2017   Acid reflux 06/29/2015   Dyslipidemia associated with type 2 diabetes mellitus (HCC) 06/29/2015   Cardiac murmur 06/29/2015   Arthritis of knee, degenerative 06/28/2015   Insomnia w/ sleep apnea 03/21/2015   Anxiety disorder 03/21/2015   Essential hypertension 03/21/2015   HLD (hyperlipidemia) 03/21/2015   Urge incontinence 03/21/2015   OSA (obstructive sleep apnea) 02/02/2014    Past Surgical History:  Procedure Laterality Date   BRAIN TUMOR EXCISION  2012   benign   CARDIAC CATHETERIZATION  2008   CESAREAN SECTION     CYST REMOVAL HAND     LUNG LOBECTOMY  1977   benign tumor   TOTAL KNEE ARTHROPLASTY Left 01/28/2017   Procedure: TOTAL KNEE ARTHROPLASTY;  Surgeon: Christena Flake, MD;  Location: [facility] ORS;  Service: Orthopedics;  Laterality: Left;    Family History  Problem Relation Age of Onset   Heart disease Brother    Depression Mother    Heart attack Mother    Stroke Mother    Alcohol abuse Father    Stroke Father    Diabetes Sister    Diabetes Sister    Stomach cancer Sister    Kidney disease Sister     COPD Brother    Lung cancer Brother    Diabetes Brother     Social History   Tobacco Use   Smoking status: Never   Smokeless tobacco: Never  Substance Use Topics   Alcohol use: No    Alcohol/week: 0.0 standard drinks of alcohol     Current Outpatient Medications:    amLODipine (NORVASC) 5 MG tablet, Take 1 tablet (5 mg total) by mouth daily., Disp: 30 tablet, Rfl: 3   haloperidol decanoate (HALDOL DECANOATE) 100 MG/ML injection, Inject 100 mg into the muscle every 28 (twenty-eight) days., Disp: , Rfl:  INGREZZA 40 & 80 MG CPPK, Take 1 capsule by mouth daily., Disp: , Rfl:    linagliptin (TRADJENTA) 5 MG TABS tablet, Take 1 tablet (5 mg total) by mouth daily., Disp: 30 tablet, Rfl:    polyethylene glycol (MIRALAX / GLYCOLAX) 17 g packet, Take 17 g by mouth daily as needed for mild constipation., Disp: 14 each, Rfl: 0   QC LO-DOSE ASPIRIN 81 MG EC tablet, Take 81 mg by mouth daily., Disp: , Rfl:    rosuvastatin (CRESTOR) 40 MG tablet, TAKE 1 TABLET BY MOUTH ONCE DAILY., Disp: 30 tablet, Rfl: 1   traZODone (DESYREL) 50 MG tablet, TAKE (1) TABLET BY MOUTH AT BEDTIME., Disp: 30 tablet, Rfl: 0   valsartan (DIOVAN) 320 MG tablet, Take 1 tablet (320 mg total) by mouth daily., Disp: 30 tablet, Rfl: 3  Allergies  Allergen Reactions   Percocet [Oxycodone-Acetaminophen] Diarrhea, Nausea And Vomiting and Nausea Only   Tramadol Hcl Diarrhea, Nausea And Vomiting and Nausea Only   Vicodin [Hydrocodone-Acetaminophen] Diarrhea, Nausea And Vomiting and Nausea Only    I personally reviewed active problem list, medication list, allergies, family history, social history, health maintenance with the patient/caregiver today.   ROS  ***  Objective  There were no vitals filed for this visit.  There is no height or weight on file to calculate BMI.  Physical Exam ***   PHQ2/9:    10/01/2022   12:02 PM 09/18/2022   11:12 AM 07/25/2022   10:31 AM 06/20/2022   10:40 AM 01/22/2022    3:11  PM  Depression screen PHQ 2/9  Decreased Interest 1 1 1 1 2   Down, Depressed, Hopeless 1 1 1 1  0  PHQ - 2 Score 2 2 2 2 2   Altered sleeping 1 1 1 1 3   Tired, decreased energy 1 1 1 1 2   Change in appetite 0 0 0 0 1  Feeling bad or failure about yourself  0 0 0 0 0  Trouble concentrating 0 0 0 0 1  Moving slowly or fidgety/restless 0 0 0 0 1  Suicidal thoughts 0 0 0 0 0  PHQ-9 Score 4 4 4 4 10   Difficult doing work/chores Not difficult at all Not difficult at all Not difficult at all Somewhat difficult Extremely dIfficult    phq 9 is {gen pos [id number]}   Fall Risk:    10/01/2022   12:02 PM 09/18/2022   11:12 AM 07/25/2022   10:29 AM 06/20/2022   10:40 AM 01/22/2022    3:13 PM  Fall Risk   Falls in the past year? 0 0 0 0 1  Number falls in past yr:    0 0  Injury with Fall?    0 0  Risk for fall due to : Impaired mobility;Orthopedic patient  Impaired balance/gait No Fall Risks No Fall Risks  Follow up Falls prevention discussed;Education provided;Falls evaluation completed Falls prevention discussed;Education provided;Falls evaluation completed Falls prevention discussed;Education provided;Falls evaluation completed Falls prevention discussed;Education provided Falls prevention discussed      Functional Status Survey:      Assessment & Plan  *** There are no diagnoses linked to this encounter.

## 2023-01-14 ENCOUNTER — Ambulatory Visit: Payer: Medicare Other | Admitting: Family Medicine

## 2023-01-14 DIAGNOSIS — E1169 Type 2 diabetes mellitus with other specified complication: Secondary | ICD-10-CM

## 2023-01-14 DIAGNOSIS — R600 Localized edema: Secondary | ICD-10-CM | POA: Diagnosis not present

## 2023-01-14 DIAGNOSIS — R251 Tremor, unspecified: Secondary | ICD-10-CM | POA: Diagnosis not present

## 2023-01-15 DIAGNOSIS — R251 Tremor, unspecified: Secondary | ICD-10-CM | POA: Diagnosis not present

## 2023-01-15 DIAGNOSIS — M25561 Pain in right knee: Secondary | ICD-10-CM | POA: Diagnosis not present

## 2023-01-15 DIAGNOSIS — M25562 Pain in left knee: Secondary | ICD-10-CM | POA: Diagnosis not present

## 2023-01-17 DIAGNOSIS — W19XXXA Unspecified fall, initial encounter: Secondary | ICD-10-CM | POA: Diagnosis not present

## 2023-01-17 DIAGNOSIS — R251 Tremor, unspecified: Secondary | ICD-10-CM | POA: Diagnosis not present

## 2023-01-17 DIAGNOSIS — M6281 Muscle weakness (generalized): Secondary | ICD-10-CM | POA: Diagnosis not present

## 2023-01-19 DIAGNOSIS — M6281 Muscle weakness (generalized): Secondary | ICD-10-CM | POA: Diagnosis not present

## 2023-01-19 DIAGNOSIS — N171 Acute kidney failure with acute cortical necrosis: Secondary | ICD-10-CM | POA: Diagnosis not present

## 2023-01-19 DIAGNOSIS — M25561 Pain in right knee: Secondary | ICD-10-CM | POA: Diagnosis not present

## 2023-01-21 DIAGNOSIS — N171 Acute kidney failure with acute cortical necrosis: Secondary | ICD-10-CM | POA: Diagnosis not present

## 2023-01-21 DIAGNOSIS — M25561 Pain in right knee: Secondary | ICD-10-CM | POA: Diagnosis not present

## 2023-01-21 DIAGNOSIS — M6281 Muscle weakness (generalized): Secondary | ICD-10-CM | POA: Diagnosis not present

## 2023-01-22 DIAGNOSIS — M6281 Muscle weakness (generalized): Secondary | ICD-10-CM | POA: Diagnosis not present

## 2023-01-22 DIAGNOSIS — N171 Acute kidney failure with acute cortical necrosis: Secondary | ICD-10-CM | POA: Diagnosis not present

## 2023-01-22 DIAGNOSIS — M25561 Pain in right knee: Secondary | ICD-10-CM | POA: Diagnosis not present

## 2023-01-23 DIAGNOSIS — N171 Acute kidney failure with acute cortical necrosis: Secondary | ICD-10-CM | POA: Diagnosis not present

## 2023-01-23 DIAGNOSIS — M25561 Pain in right knee: Secondary | ICD-10-CM | POA: Diagnosis not present

## 2023-01-23 DIAGNOSIS — M6281 Muscle weakness (generalized): Secondary | ICD-10-CM | POA: Diagnosis not present

## 2023-01-24 DIAGNOSIS — M25561 Pain in right knee: Secondary | ICD-10-CM | POA: Diagnosis not present

## 2023-01-24 DIAGNOSIS — N171 Acute kidney failure with acute cortical necrosis: Secondary | ICD-10-CM | POA: Diagnosis not present

## 2023-01-24 DIAGNOSIS — M6281 Muscle weakness (generalized): Secondary | ICD-10-CM | POA: Diagnosis not present

## 2023-01-27 DIAGNOSIS — M6281 Muscle weakness (generalized): Secondary | ICD-10-CM | POA: Diagnosis not present

## 2023-01-27 DIAGNOSIS — M25561 Pain in right knee: Secondary | ICD-10-CM | POA: Diagnosis not present

## 2023-01-27 DIAGNOSIS — R110 Nausea: Secondary | ICD-10-CM | POA: Diagnosis not present

## 2023-01-27 DIAGNOSIS — N171 Acute kidney failure with acute cortical necrosis: Secondary | ICD-10-CM | POA: Diagnosis not present

## 2023-01-27 DIAGNOSIS — R531 Weakness: Secondary | ICD-10-CM | POA: Diagnosis not present

## 2023-01-27 DIAGNOSIS — K5900 Constipation, unspecified: Secondary | ICD-10-CM | POA: Diagnosis not present

## 2023-01-29 DIAGNOSIS — G629 Polyneuropathy, unspecified: Secondary | ICD-10-CM | POA: Diagnosis not present

## 2023-01-29 DIAGNOSIS — M6281 Muscle weakness (generalized): Secondary | ICD-10-CM | POA: Diagnosis not present

## 2023-01-29 DIAGNOSIS — M25561 Pain in right knee: Secondary | ICD-10-CM | POA: Diagnosis not present

## 2023-01-29 DIAGNOSIS — R251 Tremor, unspecified: Secondary | ICD-10-CM | POA: Diagnosis not present

## 2023-01-29 DIAGNOSIS — N171 Acute kidney failure with acute cortical necrosis: Secondary | ICD-10-CM | POA: Diagnosis not present

## 2023-01-30 DIAGNOSIS — N171 Acute kidney failure with acute cortical necrosis: Secondary | ICD-10-CM | POA: Diagnosis not present

## 2023-01-30 DIAGNOSIS — M25561 Pain in right knee: Secondary | ICD-10-CM | POA: Diagnosis not present

## 2023-01-30 DIAGNOSIS — M6281 Muscle weakness (generalized): Secondary | ICD-10-CM | POA: Diagnosis not present

## 2023-01-30 DIAGNOSIS — G2401 Drug induced subacute dyskinesia: Secondary | ICD-10-CM | POA: Diagnosis not present

## 2023-01-31 DIAGNOSIS — E785 Hyperlipidemia, unspecified: Secondary | ICD-10-CM | POA: Diagnosis not present

## 2023-01-31 DIAGNOSIS — R1110 Vomiting, unspecified: Secondary | ICD-10-CM | POA: Diagnosis not present

## 2023-01-31 DIAGNOSIS — M6281 Muscle weakness (generalized): Secondary | ICD-10-CM | POA: Diagnosis not present

## 2023-01-31 DIAGNOSIS — I10 Essential (primary) hypertension: Secondary | ICD-10-CM | POA: Diagnosis not present

## 2023-01-31 DIAGNOSIS — N171 Acute kidney failure with acute cortical necrosis: Secondary | ICD-10-CM | POA: Diagnosis not present

## 2023-01-31 DIAGNOSIS — M25561 Pain in right knee: Secondary | ICD-10-CM | POA: Diagnosis not present

## 2023-01-31 DIAGNOSIS — E089 Diabetes mellitus due to underlying condition without complications: Secondary | ICD-10-CM | POA: Diagnosis not present

## 2023-02-01 DIAGNOSIS — N171 Acute kidney failure with acute cortical necrosis: Secondary | ICD-10-CM | POA: Diagnosis not present

## 2023-02-01 DIAGNOSIS — M25561 Pain in right knee: Secondary | ICD-10-CM | POA: Diagnosis not present

## 2023-02-01 DIAGNOSIS — M6281 Muscle weakness (generalized): Secondary | ICD-10-CM | POA: Diagnosis not present

## 2023-02-03 DIAGNOSIS — M6281 Muscle weakness (generalized): Secondary | ICD-10-CM | POA: Diagnosis not present

## 2023-02-03 DIAGNOSIS — R1110 Vomiting, unspecified: Secondary | ICD-10-CM | POA: Diagnosis not present

## 2023-02-03 DIAGNOSIS — K219 Gastro-esophageal reflux disease without esophagitis: Secondary | ICD-10-CM | POA: Diagnosis not present

## 2023-02-03 DIAGNOSIS — N171 Acute kidney failure with acute cortical necrosis: Secondary | ICD-10-CM | POA: Diagnosis not present

## 2023-02-03 DIAGNOSIS — M25561 Pain in right knee: Secondary | ICD-10-CM | POA: Diagnosis not present

## 2023-02-03 DIAGNOSIS — K30 Functional dyspepsia: Secondary | ICD-10-CM | POA: Diagnosis not present

## 2023-02-04 DIAGNOSIS — M25561 Pain in right knee: Secondary | ICD-10-CM | POA: Diagnosis not present

## 2023-02-04 DIAGNOSIS — N171 Acute kidney failure with acute cortical necrosis: Secondary | ICD-10-CM | POA: Diagnosis not present

## 2023-02-04 DIAGNOSIS — M6281 Muscle weakness (generalized): Secondary | ICD-10-CM | POA: Diagnosis not present

## 2023-02-05 DIAGNOSIS — M6281 Muscle weakness (generalized): Secondary | ICD-10-CM | POA: Diagnosis not present

## 2023-02-05 DIAGNOSIS — N171 Acute kidney failure with acute cortical necrosis: Secondary | ICD-10-CM | POA: Diagnosis not present

## 2023-02-05 DIAGNOSIS — R251 Tremor, unspecified: Secondary | ICD-10-CM | POA: Diagnosis not present

## 2023-02-05 DIAGNOSIS — M25561 Pain in right knee: Secondary | ICD-10-CM | POA: Diagnosis not present

## 2023-02-05 DIAGNOSIS — Z9889 Other specified postprocedural states: Secondary | ICD-10-CM | POA: Diagnosis not present

## 2023-02-05 DIAGNOSIS — Z8603 Personal history of neoplasm of uncertain behavior: Secondary | ICD-10-CM | POA: Diagnosis not present

## 2023-02-05 DIAGNOSIS — R569 Unspecified convulsions: Secondary | ICD-10-CM | POA: Diagnosis not present

## 2023-02-05 DIAGNOSIS — R200 Anesthesia of skin: Secondary | ICD-10-CM | POA: Diagnosis not present

## 2023-02-05 DIAGNOSIS — R202 Paresthesia of skin: Secondary | ICD-10-CM | POA: Diagnosis not present

## 2023-02-05 DIAGNOSIS — M25562 Pain in left knee: Secondary | ICD-10-CM | POA: Diagnosis not present

## 2023-02-07 DIAGNOSIS — E785 Hyperlipidemia, unspecified: Secondary | ICD-10-CM | POA: Diagnosis not present

## 2023-02-10 DIAGNOSIS — I10 Essential (primary) hypertension: Secondary | ICD-10-CM | POA: Diagnosis not present

## 2023-02-10 DIAGNOSIS — E089 Diabetes mellitus due to underlying condition without complications: Secondary | ICD-10-CM | POA: Diagnosis not present

## 2023-02-10 DIAGNOSIS — R5381 Other malaise: Secondary | ICD-10-CM | POA: Diagnosis not present

## 2023-02-10 DIAGNOSIS — E785 Hyperlipidemia, unspecified: Secondary | ICD-10-CM | POA: Diagnosis not present

## 2023-02-11 DIAGNOSIS — M069 Rheumatoid arthritis, unspecified: Secondary | ICD-10-CM | POA: Diagnosis not present

## 2023-02-11 DIAGNOSIS — I10 Essential (primary) hypertension: Secondary | ICD-10-CM | POA: Diagnosis not present

## 2023-02-11 DIAGNOSIS — E785 Hyperlipidemia, unspecified: Secondary | ICD-10-CM | POA: Diagnosis not present

## 2023-02-11 DIAGNOSIS — G4700 Insomnia, unspecified: Secondary | ICD-10-CM | POA: Diagnosis not present

## 2023-02-11 DIAGNOSIS — F32A Depression, unspecified: Secondary | ICD-10-CM | POA: Diagnosis not present

## 2023-02-11 DIAGNOSIS — E119 Type 2 diabetes mellitus without complications: Secondary | ICD-10-CM | POA: Diagnosis not present

## 2023-02-19 DIAGNOSIS — Z79899 Other long term (current) drug therapy: Secondary | ICD-10-CM | POA: Diagnosis not present

## 2023-02-25 DIAGNOSIS — D649 Anemia, unspecified: Secondary | ICD-10-CM | POA: Diagnosis not present

## 2023-02-25 DIAGNOSIS — M6281 Muscle weakness (generalized): Secondary | ICD-10-CM | POA: Diagnosis not present

## 2023-02-25 DIAGNOSIS — R633 Feeding difficulties, unspecified: Secondary | ICD-10-CM | POA: Diagnosis not present

## 2023-02-25 DIAGNOSIS — Z0189 Encounter for other specified special examinations: Secondary | ICD-10-CM | POA: Diagnosis not present

## 2023-02-26 DIAGNOSIS — M6281 Muscle weakness (generalized): Secondary | ICD-10-CM | POA: Diagnosis not present

## 2023-02-26 DIAGNOSIS — R633 Feeding difficulties, unspecified: Secondary | ICD-10-CM | POA: Diagnosis not present

## 2023-02-27 DIAGNOSIS — M6281 Muscle weakness (generalized): Secondary | ICD-10-CM | POA: Diagnosis not present

## 2023-02-27 DIAGNOSIS — R633 Feeding difficulties, unspecified: Secondary | ICD-10-CM | POA: Diagnosis not present

## 2023-02-28 DIAGNOSIS — M6281 Muscle weakness (generalized): Secondary | ICD-10-CM | POA: Diagnosis not present

## 2023-02-28 DIAGNOSIS — R633 Feeding difficulties, unspecified: Secondary | ICD-10-CM | POA: Diagnosis not present

## 2023-03-01 DIAGNOSIS — E119 Type 2 diabetes mellitus without complications: Secondary | ICD-10-CM | POA: Diagnosis not present

## 2023-03-01 DIAGNOSIS — E785 Hyperlipidemia, unspecified: Secondary | ICD-10-CM | POA: Diagnosis not present

## 2023-03-04 DIAGNOSIS — R251 Tremor, unspecified: Secondary | ICD-10-CM | POA: Diagnosis not present

## 2023-03-04 DIAGNOSIS — M6281 Muscle weakness (generalized): Secondary | ICD-10-CM | POA: Diagnosis not present

## 2023-03-04 DIAGNOSIS — G20A1 Parkinson's disease without dyskinesia, without mention of fluctuations: Secondary | ICD-10-CM | POA: Diagnosis not present

## 2023-03-04 DIAGNOSIS — R633 Feeding difficulties, unspecified: Secondary | ICD-10-CM | POA: Diagnosis not present

## 2023-03-05 DIAGNOSIS — M6281 Muscle weakness (generalized): Secondary | ICD-10-CM | POA: Diagnosis not present

## 2023-03-05 DIAGNOSIS — R633 Feeding difficulties, unspecified: Secondary | ICD-10-CM | POA: Diagnosis not present

## 2023-03-06 DIAGNOSIS — R633 Feeding difficulties, unspecified: Secondary | ICD-10-CM | POA: Diagnosis not present

## 2023-03-06 DIAGNOSIS — M6281 Muscle weakness (generalized): Secondary | ICD-10-CM | POA: Diagnosis not present

## 2023-03-07 DIAGNOSIS — R633 Feeding difficulties, unspecified: Secondary | ICD-10-CM | POA: Diagnosis not present

## 2023-03-07 DIAGNOSIS — M6281 Muscle weakness (generalized): Secondary | ICD-10-CM | POA: Diagnosis not present

## 2023-03-08 DIAGNOSIS — M6281 Muscle weakness (generalized): Secondary | ICD-10-CM | POA: Diagnosis not present

## 2023-03-08 DIAGNOSIS — R633 Feeding difficulties, unspecified: Secondary | ICD-10-CM | POA: Diagnosis not present

## 2023-03-10 DIAGNOSIS — M6281 Muscle weakness (generalized): Secondary | ICD-10-CM | POA: Diagnosis not present

## 2023-03-10 DIAGNOSIS — R633 Feeding difficulties, unspecified: Secondary | ICD-10-CM | POA: Diagnosis not present

## 2023-03-11 DIAGNOSIS — R633 Feeding difficulties, unspecified: Secondary | ICD-10-CM | POA: Diagnosis not present

## 2023-03-11 DIAGNOSIS — G20A1 Parkinson's disease without dyskinesia, without mention of fluctuations: Secondary | ICD-10-CM | POA: Diagnosis not present

## 2023-03-11 DIAGNOSIS — E1140 Type 2 diabetes mellitus with diabetic neuropathy, unspecified: Secondary | ICD-10-CM | POA: Diagnosis not present

## 2023-03-11 DIAGNOSIS — E785 Hyperlipidemia, unspecified: Secondary | ICD-10-CM | POA: Diagnosis not present

## 2023-03-11 DIAGNOSIS — I10 Essential (primary) hypertension: Secondary | ICD-10-CM | POA: Diagnosis not present

## 2023-03-11 DIAGNOSIS — M6281 Muscle weakness (generalized): Secondary | ICD-10-CM | POA: Diagnosis not present

## 2023-03-12 DIAGNOSIS — R633 Feeding difficulties, unspecified: Secondary | ICD-10-CM | POA: Diagnosis not present

## 2023-03-12 DIAGNOSIS — M6281 Muscle weakness (generalized): Secondary | ICD-10-CM | POA: Diagnosis not present

## 2023-03-13 DIAGNOSIS — R633 Feeding difficulties, unspecified: Secondary | ICD-10-CM | POA: Diagnosis not present

## 2023-03-13 DIAGNOSIS — M6281 Muscle weakness (generalized): Secondary | ICD-10-CM | POA: Diagnosis not present

## 2023-03-14 DIAGNOSIS — M6281 Muscle weakness (generalized): Secondary | ICD-10-CM | POA: Diagnosis not present

## 2023-03-14 DIAGNOSIS — R633 Feeding difficulties, unspecified: Secondary | ICD-10-CM | POA: Diagnosis not present

## 2023-03-17 DIAGNOSIS — R633 Feeding difficulties, unspecified: Secondary | ICD-10-CM | POA: Diagnosis not present

## 2023-03-17 DIAGNOSIS — M6281 Muscle weakness (generalized): Secondary | ICD-10-CM | POA: Diagnosis not present

## 2023-03-18 DIAGNOSIS — M6281 Muscle weakness (generalized): Secondary | ICD-10-CM | POA: Diagnosis not present

## 2023-03-18 DIAGNOSIS — R633 Feeding difficulties, unspecified: Secondary | ICD-10-CM | POA: Diagnosis not present

## 2023-03-19 DIAGNOSIS — R633 Feeding difficulties, unspecified: Secondary | ICD-10-CM | POA: Diagnosis not present

## 2023-03-19 DIAGNOSIS — M6281 Muscle weakness (generalized): Secondary | ICD-10-CM | POA: Diagnosis not present

## 2023-03-20 DIAGNOSIS — R633 Feeding difficulties, unspecified: Secondary | ICD-10-CM | POA: Diagnosis not present

## 2023-03-20 DIAGNOSIS — M6281 Muscle weakness (generalized): Secondary | ICD-10-CM | POA: Diagnosis not present

## 2023-03-21 DIAGNOSIS — R633 Feeding difficulties, unspecified: Secondary | ICD-10-CM | POA: Diagnosis not present

## 2023-03-21 DIAGNOSIS — M6281 Muscle weakness (generalized): Secondary | ICD-10-CM | POA: Diagnosis not present

## 2023-03-24 DIAGNOSIS — M6281 Muscle weakness (generalized): Secondary | ICD-10-CM | POA: Diagnosis not present

## 2023-03-24 DIAGNOSIS — R633 Feeding difficulties, unspecified: Secondary | ICD-10-CM | POA: Diagnosis not present

## 2023-03-24 DIAGNOSIS — Z79899 Other long term (current) drug therapy: Secondary | ICD-10-CM | POA: Diagnosis not present

## 2023-03-24 DIAGNOSIS — G2401 Drug induced subacute dyskinesia: Secondary | ICD-10-CM | POA: Diagnosis not present

## 2023-03-25 DIAGNOSIS — M6281 Muscle weakness (generalized): Secondary | ICD-10-CM | POA: Diagnosis not present

## 2023-03-25 DIAGNOSIS — Z79899 Other long term (current) drug therapy: Secondary | ICD-10-CM | POA: Diagnosis not present

## 2023-03-25 DIAGNOSIS — R633 Feeding difficulties, unspecified: Secondary | ICD-10-CM | POA: Diagnosis not present

## 2023-03-26 DIAGNOSIS — M6281 Muscle weakness (generalized): Secondary | ICD-10-CM | POA: Diagnosis not present

## 2023-03-26 DIAGNOSIS — R633 Feeding difficulties, unspecified: Secondary | ICD-10-CM | POA: Diagnosis not present

## 2023-03-27 DIAGNOSIS — R633 Feeding difficulties, unspecified: Secondary | ICD-10-CM | POA: Diagnosis not present

## 2023-03-27 DIAGNOSIS — M6281 Muscle weakness (generalized): Secondary | ICD-10-CM | POA: Diagnosis not present

## 2023-03-28 DIAGNOSIS — M6281 Muscle weakness (generalized): Secondary | ICD-10-CM | POA: Diagnosis not present

## 2023-03-28 DIAGNOSIS — R633 Feeding difficulties, unspecified: Secondary | ICD-10-CM | POA: Diagnosis not present

## 2023-03-31 DIAGNOSIS — R633 Feeding difficulties, unspecified: Secondary | ICD-10-CM | POA: Diagnosis not present

## 2023-03-31 DIAGNOSIS — M6281 Muscle weakness (generalized): Secondary | ICD-10-CM | POA: Diagnosis not present

## 2023-04-01 DIAGNOSIS — R633 Feeding difficulties, unspecified: Secondary | ICD-10-CM | POA: Diagnosis not present

## 2023-04-01 DIAGNOSIS — M6281 Muscle weakness (generalized): Secondary | ICD-10-CM | POA: Diagnosis not present

## 2023-04-02 DIAGNOSIS — M6281 Muscle weakness (generalized): Secondary | ICD-10-CM | POA: Diagnosis not present

## 2023-04-02 DIAGNOSIS — R633 Feeding difficulties, unspecified: Secondary | ICD-10-CM | POA: Diagnosis not present

## 2023-04-03 DIAGNOSIS — M6281 Muscle weakness (generalized): Secondary | ICD-10-CM | POA: Diagnosis not present

## 2023-04-03 DIAGNOSIS — R633 Feeding difficulties, unspecified: Secondary | ICD-10-CM | POA: Diagnosis not present

## 2023-04-04 DIAGNOSIS — I10 Essential (primary) hypertension: Secondary | ICD-10-CM | POA: Diagnosis not present

## 2023-04-04 DIAGNOSIS — E1140 Type 2 diabetes mellitus with diabetic neuropathy, unspecified: Secondary | ICD-10-CM | POA: Diagnosis not present

## 2023-04-04 DIAGNOSIS — G4733 Obstructive sleep apnea (adult) (pediatric): Secondary | ICD-10-CM | POA: Diagnosis not present

## 2023-04-04 DIAGNOSIS — M069 Rheumatoid arthritis, unspecified: Secondary | ICD-10-CM | POA: Diagnosis not present

## 2023-04-04 DIAGNOSIS — E785 Hyperlipidemia, unspecified: Secondary | ICD-10-CM | POA: Diagnosis not present

## 2023-04-12 DIAGNOSIS — Z79899 Other long term (current) drug therapy: Secondary | ICD-10-CM | POA: Diagnosis not present

## 2023-04-12 DIAGNOSIS — I10 Essential (primary) hypertension: Secondary | ICD-10-CM | POA: Diagnosis not present

## 2023-04-14 DIAGNOSIS — E1140 Type 2 diabetes mellitus with diabetic neuropathy, unspecified: Secondary | ICD-10-CM | POA: Diagnosis not present

## 2023-04-14 DIAGNOSIS — Z0189 Encounter for other specified special examinations: Secondary | ICD-10-CM | POA: Diagnosis not present

## 2023-04-17 DIAGNOSIS — Z79899 Other long term (current) drug therapy: Secondary | ICD-10-CM | POA: Diagnosis not present

## 2023-04-17 DIAGNOSIS — R6889 Other general symptoms and signs: Secondary | ICD-10-CM | POA: Diagnosis not present

## 2023-04-24 DIAGNOSIS — F32A Depression, unspecified: Secondary | ICD-10-CM | POA: Diagnosis not present

## 2023-04-29 DIAGNOSIS — E1140 Type 2 diabetes mellitus with diabetic neuropathy, unspecified: Secondary | ICD-10-CM | POA: Diagnosis not present

## 2023-04-29 DIAGNOSIS — I69922 Dysarthria following unspecified cerebrovascular disease: Secondary | ICD-10-CM | POA: Diagnosis not present

## 2023-04-29 DIAGNOSIS — I10 Essential (primary) hypertension: Secondary | ICD-10-CM | POA: Diagnosis not present

## 2023-04-29 DIAGNOSIS — G4733 Obstructive sleep apnea (adult) (pediatric): Secondary | ICD-10-CM | POA: Diagnosis not present

## 2023-04-29 DIAGNOSIS — M069 Rheumatoid arthritis, unspecified: Secondary | ICD-10-CM | POA: Diagnosis not present

## 2023-04-29 DIAGNOSIS — M6281 Muscle weakness (generalized): Secondary | ICD-10-CM | POA: Diagnosis not present

## 2023-04-29 DIAGNOSIS — E785 Hyperlipidemia, unspecified: Secondary | ICD-10-CM | POA: Diagnosis not present

## 2023-04-29 DIAGNOSIS — G2401 Drug induced subacute dyskinesia: Secondary | ICD-10-CM | POA: Diagnosis not present

## 2023-04-30 DIAGNOSIS — M6281 Muscle weakness (generalized): Secondary | ICD-10-CM | POA: Diagnosis not present

## 2023-05-01 DIAGNOSIS — G8929 Other chronic pain: Secondary | ICD-10-CM | POA: Diagnosis not present

## 2023-05-01 DIAGNOSIS — M6281 Muscle weakness (generalized): Secondary | ICD-10-CM | POA: Diagnosis not present

## 2023-05-02 DIAGNOSIS — M6281 Muscle weakness (generalized): Secondary | ICD-10-CM | POA: Diagnosis not present

## 2023-05-03 DIAGNOSIS — M6281 Muscle weakness (generalized): Secondary | ICD-10-CM | POA: Diagnosis not present

## 2023-05-05 DIAGNOSIS — M6281 Muscle weakness (generalized): Secondary | ICD-10-CM | POA: Diagnosis not present

## 2023-05-06 DIAGNOSIS — M6281 Muscle weakness (generalized): Secondary | ICD-10-CM | POA: Diagnosis not present

## 2023-05-07 DIAGNOSIS — E785 Hyperlipidemia, unspecified: Secondary | ICD-10-CM | POA: Diagnosis not present

## 2023-05-07 DIAGNOSIS — I10 Essential (primary) hypertension: Secondary | ICD-10-CM | POA: Diagnosis not present

## 2023-05-07 DIAGNOSIS — M6281 Muscle weakness (generalized): Secondary | ICD-10-CM | POA: Diagnosis not present

## 2023-05-08 DIAGNOSIS — M6281 Muscle weakness (generalized): Secondary | ICD-10-CM | POA: Diagnosis not present

## 2023-05-09 DIAGNOSIS — D649 Anemia, unspecified: Secondary | ICD-10-CM | POA: Diagnosis not present

## 2023-05-09 DIAGNOSIS — M6281 Muscle weakness (generalized): Secondary | ICD-10-CM | POA: Diagnosis not present

## 2023-05-09 DIAGNOSIS — E0590 Thyrotoxicosis, unspecified without thyrotoxic crisis or storm: Secondary | ICD-10-CM | POA: Diagnosis not present

## 2023-05-09 DIAGNOSIS — I10 Essential (primary) hypertension: Secondary | ICD-10-CM | POA: Diagnosis not present

## 2023-05-09 DIAGNOSIS — E119 Type 2 diabetes mellitus without complications: Secondary | ICD-10-CM | POA: Diagnosis not present

## 2023-05-12 DIAGNOSIS — I69328 Other speech and language deficits following cerebral infarction: Secondary | ICD-10-CM | POA: Diagnosis not present

## 2023-05-12 DIAGNOSIS — M6281 Muscle weakness (generalized): Secondary | ICD-10-CM | POA: Diagnosis not present

## 2023-05-13 DIAGNOSIS — I69328 Other speech and language deficits following cerebral infarction: Secondary | ICD-10-CM | POA: Diagnosis not present

## 2023-05-13 DIAGNOSIS — E8809 Other disorders of plasma-protein metabolism, not elsewhere classified: Secondary | ICD-10-CM | POA: Diagnosis not present

## 2023-05-13 DIAGNOSIS — M6281 Muscle weakness (generalized): Secondary | ICD-10-CM | POA: Diagnosis not present

## 2023-05-13 DIAGNOSIS — E039 Hypothyroidism, unspecified: Secondary | ICD-10-CM | POA: Diagnosis not present

## 2023-05-14 DIAGNOSIS — I69328 Other speech and language deficits following cerebral infarction: Secondary | ICD-10-CM | POA: Diagnosis not present

## 2023-05-14 DIAGNOSIS — M6281 Muscle weakness (generalized): Secondary | ICD-10-CM | POA: Diagnosis not present

## 2023-05-15 DIAGNOSIS — M6281 Muscle weakness (generalized): Secondary | ICD-10-CM | POA: Diagnosis not present

## 2023-05-15 DIAGNOSIS — I69328 Other speech and language deficits following cerebral infarction: Secondary | ICD-10-CM | POA: Diagnosis not present

## 2023-05-16 DIAGNOSIS — M6281 Muscle weakness (generalized): Secondary | ICD-10-CM | POA: Diagnosis not present

## 2023-05-16 DIAGNOSIS — I69328 Other speech and language deficits following cerebral infarction: Secondary | ICD-10-CM | POA: Diagnosis not present

## 2023-05-16 DIAGNOSIS — M25561 Pain in right knee: Secondary | ICD-10-CM | POA: Diagnosis not present

## 2023-05-17 DIAGNOSIS — I69328 Other speech and language deficits following cerebral infarction: Secondary | ICD-10-CM | POA: Diagnosis not present

## 2023-05-17 DIAGNOSIS — M6281 Muscle weakness (generalized): Secondary | ICD-10-CM | POA: Diagnosis not present

## 2023-05-18 DIAGNOSIS — I69328 Other speech and language deficits following cerebral infarction: Secondary | ICD-10-CM | POA: Diagnosis not present

## 2023-05-18 DIAGNOSIS — M6281 Muscle weakness (generalized): Secondary | ICD-10-CM | POA: Diagnosis not present

## 2023-05-20 DIAGNOSIS — M6281 Muscle weakness (generalized): Secondary | ICD-10-CM | POA: Diagnosis not present

## 2023-05-20 DIAGNOSIS — I69328 Other speech and language deficits following cerebral infarction: Secondary | ICD-10-CM | POA: Diagnosis not present

## 2023-05-21 DIAGNOSIS — M25561 Pain in right knee: Secondary | ICD-10-CM | POA: Diagnosis not present

## 2023-05-21 DIAGNOSIS — I69328 Other speech and language deficits following cerebral infarction: Secondary | ICD-10-CM | POA: Diagnosis not present

## 2023-05-21 DIAGNOSIS — M1711 Unilateral primary osteoarthritis, right knee: Secondary | ICD-10-CM | POA: Diagnosis not present

## 2023-05-21 DIAGNOSIS — M6281 Muscle weakness (generalized): Secondary | ICD-10-CM | POA: Diagnosis not present

## 2023-05-21 NOTE — Progress Notes (Signed)
 History of Present Illness: The patient is an 72 y.o. female seen in clinic today for evaluation of her right knee.  Patient has a history of schizophrenia, diabetes, sleep apnea, hypertension, seizures, status post stroke, status post meningioma resection.  The patient reports she has had knee pain and stiffness for many years and underwent a left knee replacement years ago for similar symptoms.  She reports pain up to a 10 out of 10 at its worst currently an 8 out of 10 at rest.  She reports the pain is over the anterior medial aspect her name described as a sharp constant pain.  She has not had any recent trauma to the knee and reports it is worse when walking without any radiation.  She takes ibuprofen  with some improvement of her pain.  The patient denies fevers, chills, tingling, shortness of breath, chest pain, recent illness, or any trauma.   Past Medical History: Past Medical History:  Diagnosis Date  . Adenomatous colon polyp   . Anxiety   . Depression   . Diabetes (CMS/HHS-HCC)   . Fibromyalgia   . GERD (gastroesophageal reflux disease)   . Heart murmur   . History of cardiac murmur   . History of meningioma    removed 2012  . Hyperlipidemia   . Hypertension   . Lack of bladder control   . Lung tumor   . Meningioma (CMS/HHS-HCC)   . Rheumatoid arthritis (CMS/HHS-HCC)   . Schizoaffective disorder (CMS/HHS-HCC)   . Seizure disorder (CMS/HHS-HCC)   . Seizures (CMS/HHS-HCC)   . Sleep apnea     Past Surgical History: Past Surgical History:  Procedure Laterality Date  . Ganglion cyst removed  1984  . CESAREAN SECTION  1992  . Lung tumor removed  1997   benign  . CARDIAC CATHETERIZATION  2008  . COLONOSCOPY  12/09/2013   Adenomatous Polyp: CBF 12/2018: Recall ltr mailed   . EGD  12/09/2013  . Left TKA using aal-cemented biomet vangaurd system with 625mm PCR femur and a 67 mm tibial tray with a 10 mm AS E-poly insert  Left 01/28/2017   Dr.Poggi   . Meningioma removed from  brain    . REPLACEMENT TOTAL KNEE Left     Past Family History: Family History  Problem Relation Age of Onset  . Myocardial Infarction (Heart attack) Mother   . Diabetes Mother   . Stroke Father   . Myocardial Infarction (Heart attack) Brother   . Colon polyps Brother   . Diabetes type II Brother   . High blood pressure (Hypertension) Brother   . Heart disease Brother   . Lung cancer Brother   . Stomach cancer Sister   . Cancer Other        blood cancer,   Aunt  . Liver cancer Other        grandmother  . Lupus Cousin     Medications: Current Outpatient Medications  Medication Sig Dispense Refill  . meloxicam  (MOBIC ) 7.5 MG tablet     . AMLODIPINE  BESYLATE, BULK, MISC Take 5 mg by mouth once daily For essential hypertension    . ascorbic acid  (VITAMIN C  ORAL) Take 500 mg by mouth once daily       . aspirin  81 MG EC tablet Take 81 mg by mouth once daily.    . atorvastatin  (LIPITOR) 40 MG tablet Take 1 tablet by mouth once daily    . atorvastatin  (LIPITOR) 80 MG tablet Take 80 mg by mouth once daily    .  benzonatate  (TESSALON ) 200 MG capsule Take 100 mg by mouth 3 (three) times daily as needed for Cough    . benztropine  (COGENTIN ) 0.5 MG tablet Take 0.5mg  three times daily 90 tablet 3  . cetirizine (ZYRTEC) 10 MG chewable tablet Take 10 mg by mouth once daily    . escitalopram  oxalate (LEXAPRO ) 10 MG tablet Take 10 mg by mouth once daily    (Patient not taking: Reported on 02/05/2023)    . Fluoride Toothpaste (BIOTENE DENT) Place 1 Application onto teeth 2 (two) times daily    . gabapentin  (NEURONTIN ) 100 MG capsule Take 100 mg by mouth 2 (two) times daily (Patient not taking: Reported on 02/05/2023)    . haloperidol  decanoate (HALDOL  DECANOATE) 100 mg/mL injection Inject 1 mL into the muscle every 30 (thirty) days       . hydroCHLOROthiazide  (HYDRODIURIL ) 12.5 MG tablet Take 12.5 mg by mouth once daily    (Patient not taking: Reported on 02/05/2023)    . ibuprofen  (MOTRIN ) 200 MG  tablet Take 200 mg by mouth every 6 (six) hours as needed for Pain    . ibuprofen  (MOTRIN ) 600 MG tablet     . Lactoperoxi/Gluc Oxid/Pot Thio (BIOTENE DRY MOUTH MM) 1 Application by Mucous Membrane route 2 (two) times daily    . lisinopril  (ZESTRIL ) 20 MG tablet Take 20 mg by mouth once daily (Patient not taking: Reported on 02/05/2023)    . metFORMIN  (GLUCOPHAGE ) 500 MG tablet TAKE 1 TABLET BY MOUTH TWICE DAILY WITH A MEAL (Patient not taking: Reported on 02/05/2023)    . omeprazole  (PRILOSEC OTC) 20 MG EC tablet Take 20 mg by mouth once daily    . omeprazole  (PRILOSEC) 20 MG DR capsule take 1 capsule by mouth once daily 30 MINUTES BEFORE BREAKFAST 30 capsule 3  . polyethylene glycol (MIRALAX ) packet Take 17 g by mouth once daily Mix in 4-8ounces of fluid prior to taking.    . potassium chloride  (KLOR-CON ) 20 MEQ ER tablet Take by mouth (Patient not taking: Reported on 02/05/2023)    . pregabalin (LYRICA) 50 MG capsule Take 50 mg twice daily (Patient taking differently: 75 mg 3 (three) times daily) 60 capsule 3  . sertraline  (ZOLOFT ) 25 MG tablet Take 25 mg by mouth once daily    . SITagliptin phosphate (JANUVIA) 100 MG tablet Take 100 mg by mouth once daily    . traZODone  (DESYREL ) 50 MG tablet Take 1 tablet by mouth nightly    . UNABLE TO FIND Place into one nostril once daily as needed (every 2 hours as needed) Med Name: Saline nasal spray. 1 spray in each nostril    . valbenazine  40 mg (7)- 80 mg (21) CpPk Take by mouth once daily Give one capsule by mouth daily for drug induce subacute dyskinesia.    . valsartan  (DIOVAN ) 320 MG tablet Take 320 mg by mouth once daily     No current facility-administered medications for this visit.    Allergies: Allergies  Allergen Reactions  . Percocet [Oxycodone-Acetaminophen ] Vomiting, Diarrhea, Nausea And Vomiting and Nausea  . Tramadol  Other (See Comments)  . Tramadol  Hcl Diarrhea, Nausea And Vomiting and Nausea  . Vicodin [Hydrocodone-Acetaminophen ]  Vomiting, Diarrhea, Nausea And Vomiting and Nausea     Visit Vitals: There were no vitals filed for this visit.   Review of Systems:  A comprehensive 14 point ROS was performed, reviewed, and the pertinent orthopaedic findings are documented in the HPI.  Physical Exam: General/Constitutional: No apparent distress: well-nourished  and well developed. Eyes: Pupils equal, round with synchronous movement. Respiratory: Non-labored breathing. Cardiac:  Heart rate is regular. Integumentary: No impressive skin lesions present, except as noted in detailed exam. Neuro/Psych: Normal mood and affect, oriented to person, place and time.  Comprehensive Knee Exam: Gait Gait not assessed presents in wheelchair  Alignment Neutral   Inspection  Right  Skin Normal appearance with no obvious deformity.  No ecchymosis or erythema.  Soft Tissue No focal soft tissue swelling  Quad Atrophy None   Palpation   Right  Tenderness Medial joint line and parapatellar tenderness palpation  Crepitus + patellofemoral and tibiofemoral crepitus  Effusion None   Range of Motion  Right  Flexion  5-105  Extension  Full knee extension without hyperextension   Ligamentous Exam   Right  Lachman Normal  Valgus 0 Normal  Valgus 30 Normal  Varus 0 Normal  Varus 30 Normal  Anterior Drawer Normal  Posterior Drawer Normal    Meniscal Exam   Right  Hyperflexion Test Positive  Hyperextension Test Positive  McMurray's Positive      Neurovascular   Right  Quadriceps Strength 5/5  Hamstring Strength 5/5  Hip Abductor Strength 4/5  Distal Motor Normal  Distal Sensory Normal light touch sensation  Distal Pulses Normal     Imaging Studies: I have reviewed AP, lateral,sunrise weight bearing knee X-rays (3 views) of the right knee ordered and taken today in the office show severe degenerative changes with medial and patellofemoral joint space narrowing with medial bone-on-bone articulation with osteophyte  formation, subchondral cysts and sclerosis.  Kellgren-Lawrence grade 4. AP, sunrise of the left knee also show status post total knee arthroplasty with components in appropriate appearing position with no evidence of periprosthetic fracture or loosening.  Patella is not resurfaced on the left side.    Assessment:    ICD-10-CM  1. Right knee pain, unspecified chronicity  M25.561  Right knee osteoarthritis  Plan: Based on the clinical and radiographic evaluation of the patient their primary symptomology is likely secondary to osteoarthritis in the right knee.  I discussed the natural history and clinical course of osteoarthritis with the patient and reviewed their radiology and physical exam findings consistent with the disease process.  We discussed the treatment options for osteoarthritis including but not limited to; weight loss, home exercise program, anti-inflammatory medications, bracing, lifestyle modification, injection options including steroid and hyaluronic acid based injections, physical therapy, and the possibility of surgery.  A thorough discussion was held discussing the risks and benefits of each of these interventions and potential for symptomatic improvement.  The patient would like to try a steroid injection today and I recommended physical therapy for the patient who currently resides at [facility].  Indicated is much on the paperwork to be returned [place] to work on gait and strengthening training as she appears to be nonambulatory much of the time.  She may follow-up in 3 to 4 months for repeat injection as needed.  right knee steroid injection    Consent After discussing the various treatment options for the condition,  It was agreed that a corticosteroid injection would be the next step in treatment.  The nature of and the indications for a corticosteroid and / or local anaesthetic injection were reviewed in detail with the patient today.  The inherent risks of  injection including infection, allergic reaction, increased pain, incomplete relief or temporary relief of symptoms, alterations of blood glucose levels requiring careful monitoring and treatment as indicated, tendon,  ligament or articular cartilage rupture or degeneration, nerve injury, skin depigmentation, and/or fatty atrophy were discussed.     Procedure After the risks and benefits of the procedure were explained, consent was given, and time-out was performed. The site for the injection was properly marked and prepped with Chlorhexadine/Isopropyl alcohol solution.      The injection site was anesthetized with ethyl chloride.  The right knee was injected with a 22 gauge 1.5 inch needle.  40 milligrams of Triamcinolone , 3 milliliters of 0.5% Bupivacaine , and 3 milliliters of 1% Lidocaine using a sterile technique. During injection, there was unrestricted flow and care was taken not to inject corticosteroid into the skin or subcutaneous tissues.    A sterile band-aide was applied.  Post-injection instructions were given regarding post-procedure care, when to follow up in clinic and what to expect from the procedure.  The patient tolerated the injection well and was discharged without complication.    Portions of this record have been created using Scientist, clinical (histocompatibility and immunogenetics).  Dictation errors have been sought, but may not have been identified and corrected.  Large Joint Injection: R knee  Date/Time: 05/21/2023 2:04 PM  Performed by: Lorelle Arthea Arch, MD Authorized by: Lorelle Arthea Arch, MD   Needle Size:  22 G Location:  Knee Site:  R knee Medications:  3 mL BUPivacaine  HCl 0.5 %; 3 mL lidocaine 1 %; 40 mg triamcinolone  acetonide 40 mg/mL  Zachary Aberman MD

## 2023-05-22 DIAGNOSIS — M6281 Muscle weakness (generalized): Secondary | ICD-10-CM | POA: Diagnosis not present

## 2023-05-22 DIAGNOSIS — I69328 Other speech and language deficits following cerebral infarction: Secondary | ICD-10-CM | POA: Diagnosis not present

## 2023-05-22 DIAGNOSIS — Z79899 Other long term (current) drug therapy: Secondary | ICD-10-CM | POA: Diagnosis not present

## 2023-05-23 DIAGNOSIS — I69328 Other speech and language deficits following cerebral infarction: Secondary | ICD-10-CM | POA: Diagnosis not present

## 2023-05-23 DIAGNOSIS — M6281 Muscle weakness (generalized): Secondary | ICD-10-CM | POA: Diagnosis not present

## 2023-05-24 DIAGNOSIS — M6281 Muscle weakness (generalized): Secondary | ICD-10-CM | POA: Diagnosis not present

## 2023-05-24 DIAGNOSIS — I69328 Other speech and language deficits following cerebral infarction: Secondary | ICD-10-CM | POA: Diagnosis not present

## 2023-05-26 DIAGNOSIS — I69328 Other speech and language deficits following cerebral infarction: Secondary | ICD-10-CM | POA: Diagnosis not present

## 2023-05-26 DIAGNOSIS — M25562 Pain in left knee: Secondary | ICD-10-CM | POA: Diagnosis not present

## 2023-05-26 DIAGNOSIS — M6281 Muscle weakness (generalized): Secondary | ICD-10-CM | POA: Diagnosis not present

## 2023-05-26 DIAGNOSIS — W19XXXA Unspecified fall, initial encounter: Secondary | ICD-10-CM | POA: Diagnosis not present

## 2023-05-27 DIAGNOSIS — I69328 Other speech and language deficits following cerebral infarction: Secondary | ICD-10-CM | POA: Diagnosis not present

## 2023-05-27 DIAGNOSIS — M6281 Muscle weakness (generalized): Secondary | ICD-10-CM | POA: Diagnosis not present

## 2023-05-28 DIAGNOSIS — M6281 Muscle weakness (generalized): Secondary | ICD-10-CM | POA: Diagnosis not present

## 2023-05-28 DIAGNOSIS — I69328 Other speech and language deficits following cerebral infarction: Secondary | ICD-10-CM | POA: Diagnosis not present

## 2023-05-29 DIAGNOSIS — I69328 Other speech and language deficits following cerebral infarction: Secondary | ICD-10-CM | POA: Diagnosis not present

## 2023-05-29 DIAGNOSIS — M6281 Muscle weakness (generalized): Secondary | ICD-10-CM | POA: Diagnosis not present

## 2023-05-30 DIAGNOSIS — I69328 Other speech and language deficits following cerebral infarction: Secondary | ICD-10-CM | POA: Diagnosis not present

## 2023-05-30 DIAGNOSIS — Z008 Encounter for other general examination: Secondary | ICD-10-CM | POA: Diagnosis not present

## 2023-05-30 DIAGNOSIS — M6281 Muscle weakness (generalized): Secondary | ICD-10-CM | POA: Diagnosis not present

## 2023-06-02 DIAGNOSIS — M6281 Muscle weakness (generalized): Secondary | ICD-10-CM | POA: Diagnosis not present

## 2023-06-02 DIAGNOSIS — I69328 Other speech and language deficits following cerebral infarction: Secondary | ICD-10-CM | POA: Diagnosis not present

## 2023-06-03 DIAGNOSIS — M6281 Muscle weakness (generalized): Secondary | ICD-10-CM | POA: Diagnosis not present

## 2023-06-03 DIAGNOSIS — I69328 Other speech and language deficits following cerebral infarction: Secondary | ICD-10-CM | POA: Diagnosis not present

## 2023-06-05 DIAGNOSIS — E785 Hyperlipidemia, unspecified: Secondary | ICD-10-CM | POA: Diagnosis not present

## 2023-06-05 DIAGNOSIS — I69922 Dysarthria following unspecified cerebrovascular disease: Secondary | ICD-10-CM | POA: Diagnosis not present

## 2023-06-05 DIAGNOSIS — M25511 Pain in right shoulder: Secondary | ICD-10-CM | POA: Diagnosis not present

## 2023-06-05 DIAGNOSIS — I10 Essential (primary) hypertension: Secondary | ICD-10-CM | POA: Diagnosis not present

## 2023-06-05 DIAGNOSIS — E1140 Type 2 diabetes mellitus with diabetic neuropathy, unspecified: Secondary | ICD-10-CM | POA: Diagnosis not present

## 2023-06-05 DIAGNOSIS — G4733 Obstructive sleep apnea (adult) (pediatric): Secondary | ICD-10-CM | POA: Diagnosis not present

## 2023-06-05 DIAGNOSIS — G2401 Drug induced subacute dyskinesia: Secondary | ICD-10-CM | POA: Diagnosis not present

## 2023-06-05 DIAGNOSIS — W19XXXA Unspecified fall, initial encounter: Secondary | ICD-10-CM | POA: Diagnosis not present

## 2023-06-05 DIAGNOSIS — R079 Chest pain, unspecified: Secondary | ICD-10-CM | POA: Diagnosis not present

## 2023-06-05 DIAGNOSIS — M069 Rheumatoid arthritis, unspecified: Secondary | ICD-10-CM | POA: Diagnosis not present

## 2023-06-09 DIAGNOSIS — I69328 Other speech and language deficits following cerebral infarction: Secondary | ICD-10-CM | POA: Diagnosis not present

## 2023-06-09 DIAGNOSIS — M6281 Muscle weakness (generalized): Secondary | ICD-10-CM | POA: Diagnosis not present

## 2023-06-10 DIAGNOSIS — G629 Polyneuropathy, unspecified: Secondary | ICD-10-CM | POA: Diagnosis not present

## 2023-06-10 DIAGNOSIS — G2401 Drug induced subacute dyskinesia: Secondary | ICD-10-CM | POA: Diagnosis not present

## 2023-06-10 DIAGNOSIS — E785 Hyperlipidemia, unspecified: Secondary | ICD-10-CM | POA: Diagnosis not present

## 2023-06-10 DIAGNOSIS — Z760 Encounter for issue of repeat prescription: Secondary | ICD-10-CM | POA: Diagnosis not present

## 2023-06-10 DIAGNOSIS — W19XXXA Unspecified fall, initial encounter: Secondary | ICD-10-CM | POA: Diagnosis not present

## 2023-06-10 DIAGNOSIS — G4733 Obstructive sleep apnea (adult) (pediatric): Secondary | ICD-10-CM | POA: Diagnosis not present

## 2023-06-10 DIAGNOSIS — M6281 Muscle weakness (generalized): Secondary | ICD-10-CM | POA: Diagnosis not present

## 2023-06-10 DIAGNOSIS — I10 Essential (primary) hypertension: Secondary | ICD-10-CM | POA: Diagnosis not present

## 2023-06-10 DIAGNOSIS — E1140 Type 2 diabetes mellitus with diabetic neuropathy, unspecified: Secondary | ICD-10-CM | POA: Diagnosis not present

## 2023-06-10 DIAGNOSIS — M25511 Pain in right shoulder: Secondary | ICD-10-CM | POA: Diagnosis not present

## 2023-06-10 DIAGNOSIS — M069 Rheumatoid arthritis, unspecified: Secondary | ICD-10-CM | POA: Diagnosis not present

## 2023-06-10 DIAGNOSIS — R633 Feeding difficulties, unspecified: Secondary | ICD-10-CM | POA: Diagnosis not present

## 2023-06-11 DIAGNOSIS — R633 Feeding difficulties, unspecified: Secondary | ICD-10-CM | POA: Diagnosis not present

## 2023-06-11 DIAGNOSIS — M6281 Muscle weakness (generalized): Secondary | ICD-10-CM | POA: Diagnosis not present

## 2023-06-11 DIAGNOSIS — M069 Rheumatoid arthritis, unspecified: Secondary | ICD-10-CM | POA: Diagnosis not present

## 2023-06-20 DIAGNOSIS — G4733 Obstructive sleep apnea (adult) (pediatric): Secondary | ICD-10-CM | POA: Diagnosis not present

## 2023-06-20 DIAGNOSIS — E785 Hyperlipidemia, unspecified: Secondary | ICD-10-CM | POA: Diagnosis not present

## 2023-06-20 DIAGNOSIS — G629 Polyneuropathy, unspecified: Secondary | ICD-10-CM | POA: Diagnosis not present

## 2023-06-20 DIAGNOSIS — M25511 Pain in right shoulder: Secondary | ICD-10-CM | POA: Diagnosis not present

## 2023-06-20 DIAGNOSIS — E1140 Type 2 diabetes mellitus with diabetic neuropathy, unspecified: Secondary | ICD-10-CM | POA: Diagnosis not present

## 2023-06-20 DIAGNOSIS — G2401 Drug induced subacute dyskinesia: Secondary | ICD-10-CM | POA: Diagnosis not present

## 2023-06-20 DIAGNOSIS — Z760 Encounter for issue of repeat prescription: Secondary | ICD-10-CM | POA: Diagnosis not present

## 2023-06-20 DIAGNOSIS — I10 Essential (primary) hypertension: Secondary | ICD-10-CM | POA: Diagnosis not present

## 2023-06-20 DIAGNOSIS — W19XXXA Unspecified fall, initial encounter: Secondary | ICD-10-CM | POA: Diagnosis not present

## 2023-06-26 DIAGNOSIS — E1140 Type 2 diabetes mellitus with diabetic neuropathy, unspecified: Secondary | ICD-10-CM | POA: Diagnosis not present

## 2023-06-26 DIAGNOSIS — I10 Essential (primary) hypertension: Secondary | ICD-10-CM | POA: Diagnosis not present

## 2023-06-26 DIAGNOSIS — Z760 Encounter for issue of repeat prescription: Secondary | ICD-10-CM | POA: Diagnosis not present

## 2023-06-26 DIAGNOSIS — W19XXXA Unspecified fall, initial encounter: Secondary | ICD-10-CM | POA: Diagnosis not present

## 2023-06-26 DIAGNOSIS — E785 Hyperlipidemia, unspecified: Secondary | ICD-10-CM | POA: Diagnosis not present

## 2023-06-26 DIAGNOSIS — G629 Polyneuropathy, unspecified: Secondary | ICD-10-CM | POA: Diagnosis not present

## 2023-06-26 DIAGNOSIS — G2401 Drug induced subacute dyskinesia: Secondary | ICD-10-CM | POA: Diagnosis not present

## 2023-06-26 DIAGNOSIS — G4733 Obstructive sleep apnea (adult) (pediatric): Secondary | ICD-10-CM | POA: Diagnosis not present

## 2023-06-26 DIAGNOSIS — M25511 Pain in right shoulder: Secondary | ICD-10-CM | POA: Diagnosis not present

## 2023-07-10 DIAGNOSIS — M069 Rheumatoid arthritis, unspecified: Secondary | ICD-10-CM | POA: Diagnosis not present

## 2023-07-10 DIAGNOSIS — R633 Feeding difficulties, unspecified: Secondary | ICD-10-CM | POA: Diagnosis not present

## 2023-07-10 DIAGNOSIS — M6281 Muscle weakness (generalized): Secondary | ICD-10-CM | POA: Diagnosis not present

## 2023-07-12 DIAGNOSIS — M6281 Muscle weakness (generalized): Secondary | ICD-10-CM | POA: Diagnosis not present

## 2023-07-12 DIAGNOSIS — R633 Feeding difficulties, unspecified: Secondary | ICD-10-CM | POA: Diagnosis not present

## 2023-07-12 DIAGNOSIS — M069 Rheumatoid arthritis, unspecified: Secondary | ICD-10-CM | POA: Diagnosis not present

## 2023-07-14 DIAGNOSIS — E1140 Type 2 diabetes mellitus with diabetic neuropathy, unspecified: Secondary | ICD-10-CM | POA: Diagnosis not present

## 2023-07-14 DIAGNOSIS — W19XXXA Unspecified fall, initial encounter: Secondary | ICD-10-CM | POA: Diagnosis not present

## 2023-07-14 DIAGNOSIS — G4733 Obstructive sleep apnea (adult) (pediatric): Secondary | ICD-10-CM | POA: Diagnosis not present

## 2023-07-14 DIAGNOSIS — I10 Essential (primary) hypertension: Secondary | ICD-10-CM | POA: Diagnosis not present

## 2023-07-14 DIAGNOSIS — G629 Polyneuropathy, unspecified: Secondary | ICD-10-CM | POA: Diagnosis not present

## 2023-07-14 DIAGNOSIS — M25511 Pain in right shoulder: Secondary | ICD-10-CM | POA: Diagnosis not present

## 2023-07-14 DIAGNOSIS — E785 Hyperlipidemia, unspecified: Secondary | ICD-10-CM | POA: Diagnosis not present

## 2023-07-14 DIAGNOSIS — Z760 Encounter for issue of repeat prescription: Secondary | ICD-10-CM | POA: Diagnosis not present

## 2023-07-14 DIAGNOSIS — G2401 Drug induced subacute dyskinesia: Secondary | ICD-10-CM | POA: Diagnosis not present

## 2023-07-15 DIAGNOSIS — M6281 Muscle weakness (generalized): Secondary | ICD-10-CM | POA: Diagnosis not present

## 2023-07-15 DIAGNOSIS — M069 Rheumatoid arthritis, unspecified: Secondary | ICD-10-CM | POA: Diagnosis not present

## 2023-07-15 DIAGNOSIS — R633 Feeding difficulties, unspecified: Secondary | ICD-10-CM | POA: Diagnosis not present

## 2023-07-16 DIAGNOSIS — M6281 Muscle weakness (generalized): Secondary | ICD-10-CM | POA: Diagnosis not present

## 2023-07-16 DIAGNOSIS — M069 Rheumatoid arthritis, unspecified: Secondary | ICD-10-CM | POA: Diagnosis not present

## 2023-07-16 DIAGNOSIS — R633 Feeding difficulties, unspecified: Secondary | ICD-10-CM | POA: Diagnosis not present

## 2023-07-18 DIAGNOSIS — M6281 Muscle weakness (generalized): Secondary | ICD-10-CM | POA: Diagnosis not present

## 2023-07-18 DIAGNOSIS — M069 Rheumatoid arthritis, unspecified: Secondary | ICD-10-CM | POA: Diagnosis not present

## 2023-07-18 DIAGNOSIS — R633 Feeding difficulties, unspecified: Secondary | ICD-10-CM | POA: Diagnosis not present

## 2023-07-21 DIAGNOSIS — I10 Essential (primary) hypertension: Secondary | ICD-10-CM | POA: Diagnosis not present

## 2023-07-21 DIAGNOSIS — N390 Urinary tract infection, site not specified: Secondary | ICD-10-CM | POA: Diagnosis not present

## 2023-07-23 DIAGNOSIS — M069 Rheumatoid arthritis, unspecified: Secondary | ICD-10-CM | POA: Diagnosis not present

## 2023-07-23 DIAGNOSIS — N390 Urinary tract infection, site not specified: Secondary | ICD-10-CM | POA: Diagnosis not present

## 2023-08-08 DIAGNOSIS — R609 Edema, unspecified: Secondary | ICD-10-CM | POA: Diagnosis not present

## 2023-08-14 ENCOUNTER — Encounter: Payer: Self-pay | Admitting: Family Medicine

## 2023-08-15 DIAGNOSIS — E785 Hyperlipidemia, unspecified: Secondary | ICD-10-CM | POA: Diagnosis not present

## 2023-08-15 DIAGNOSIS — G4733 Obstructive sleep apnea (adult) (pediatric): Secondary | ICD-10-CM | POA: Diagnosis not present

## 2023-08-18 LAB — HM DIABETES EYE EXAM

## 2023-08-25 DIAGNOSIS — M069 Rheumatoid arthritis, unspecified: Secondary | ICD-10-CM | POA: Diagnosis not present

## 2023-08-25 DIAGNOSIS — R633 Feeding difficulties, unspecified: Secondary | ICD-10-CM | POA: Diagnosis not present

## 2023-08-25 DIAGNOSIS — M6281 Muscle weakness (generalized): Secondary | ICD-10-CM | POA: Diagnosis not present

## 2023-08-26 DIAGNOSIS — M069 Rheumatoid arthritis, unspecified: Secondary | ICD-10-CM | POA: Diagnosis not present

## 2023-08-26 DIAGNOSIS — R633 Feeding difficulties, unspecified: Secondary | ICD-10-CM | POA: Diagnosis not present

## 2023-08-26 DIAGNOSIS — M6281 Muscle weakness (generalized): Secondary | ICD-10-CM | POA: Diagnosis not present

## 2023-08-27 DIAGNOSIS — M6281 Muscle weakness (generalized): Secondary | ICD-10-CM | POA: Diagnosis not present

## 2023-08-27 DIAGNOSIS — R633 Feeding difficulties, unspecified: Secondary | ICD-10-CM | POA: Diagnosis not present

## 2023-08-27 DIAGNOSIS — M069 Rheumatoid arthritis, unspecified: Secondary | ICD-10-CM | POA: Diagnosis not present

## 2023-08-28 DIAGNOSIS — M6281 Muscle weakness (generalized): Secondary | ICD-10-CM | POA: Diagnosis not present

## 2023-08-28 DIAGNOSIS — M069 Rheumatoid arthritis, unspecified: Secondary | ICD-10-CM | POA: Diagnosis not present

## 2023-08-28 DIAGNOSIS — R633 Feeding difficulties, unspecified: Secondary | ICD-10-CM | POA: Diagnosis not present

## 2023-08-29 DIAGNOSIS — M6281 Muscle weakness (generalized): Secondary | ICD-10-CM | POA: Diagnosis not present

## 2023-08-29 DIAGNOSIS — M069 Rheumatoid arthritis, unspecified: Secondary | ICD-10-CM | POA: Diagnosis not present

## 2023-08-29 DIAGNOSIS — R633 Feeding difficulties, unspecified: Secondary | ICD-10-CM | POA: Diagnosis not present

## 2023-09-01 DIAGNOSIS — M069 Rheumatoid arthritis, unspecified: Secondary | ICD-10-CM | POA: Diagnosis not present

## 2023-09-01 DIAGNOSIS — M6281 Muscle weakness (generalized): Secondary | ICD-10-CM | POA: Diagnosis not present

## 2023-09-01 DIAGNOSIS — R633 Feeding difficulties, unspecified: Secondary | ICD-10-CM | POA: Diagnosis not present

## 2023-09-02 DIAGNOSIS — R633 Feeding difficulties, unspecified: Secondary | ICD-10-CM | POA: Diagnosis not present

## 2023-09-02 DIAGNOSIS — M6281 Muscle weakness (generalized): Secondary | ICD-10-CM | POA: Diagnosis not present

## 2023-09-02 DIAGNOSIS — M069 Rheumatoid arthritis, unspecified: Secondary | ICD-10-CM | POA: Diagnosis not present

## 2023-09-03 DIAGNOSIS — M069 Rheumatoid arthritis, unspecified: Secondary | ICD-10-CM | POA: Diagnosis not present

## 2023-09-03 DIAGNOSIS — R633 Feeding difficulties, unspecified: Secondary | ICD-10-CM | POA: Diagnosis not present

## 2023-09-03 DIAGNOSIS — M6281 Muscle weakness (generalized): Secondary | ICD-10-CM | POA: Diagnosis not present

## 2023-09-04 DIAGNOSIS — R633 Feeding difficulties, unspecified: Secondary | ICD-10-CM | POA: Diagnosis not present

## 2023-09-04 DIAGNOSIS — M069 Rheumatoid arthritis, unspecified: Secondary | ICD-10-CM | POA: Diagnosis not present

## 2023-09-04 DIAGNOSIS — M6281 Muscle weakness (generalized): Secondary | ICD-10-CM | POA: Diagnosis not present

## 2023-09-05 DIAGNOSIS — M6281 Muscle weakness (generalized): Secondary | ICD-10-CM | POA: Diagnosis not present

## 2023-09-05 DIAGNOSIS — M069 Rheumatoid arthritis, unspecified: Secondary | ICD-10-CM | POA: Diagnosis not present

## 2023-09-05 DIAGNOSIS — R633 Feeding difficulties, unspecified: Secondary | ICD-10-CM | POA: Diagnosis not present

## 2023-09-06 DIAGNOSIS — M6281 Muscle weakness (generalized): Secondary | ICD-10-CM | POA: Diagnosis not present

## 2023-09-06 DIAGNOSIS — M069 Rheumatoid arthritis, unspecified: Secondary | ICD-10-CM | POA: Diagnosis not present

## 2023-09-06 DIAGNOSIS — R633 Feeding difficulties, unspecified: Secondary | ICD-10-CM | POA: Diagnosis not present

## 2023-09-08 DIAGNOSIS — M6281 Muscle weakness (generalized): Secondary | ICD-10-CM | POA: Diagnosis not present

## 2023-09-08 DIAGNOSIS — M069 Rheumatoid arthritis, unspecified: Secondary | ICD-10-CM | POA: Diagnosis not present

## 2023-09-08 DIAGNOSIS — R633 Feeding difficulties, unspecified: Secondary | ICD-10-CM | POA: Diagnosis not present

## 2023-09-09 DIAGNOSIS — R531 Weakness: Secondary | ICD-10-CM | POA: Diagnosis not present

## 2023-09-09 DIAGNOSIS — M069 Rheumatoid arthritis, unspecified: Secondary | ICD-10-CM | POA: Diagnosis not present

## 2023-09-09 DIAGNOSIS — R059 Cough, unspecified: Secondary | ICD-10-CM | POA: Diagnosis not present

## 2023-09-09 DIAGNOSIS — R5383 Other fatigue: Secondary | ICD-10-CM | POA: Diagnosis not present

## 2023-09-09 DIAGNOSIS — M6281 Muscle weakness (generalized): Secondary | ICD-10-CM | POA: Diagnosis not present

## 2023-09-09 DIAGNOSIS — R633 Feeding difficulties, unspecified: Secondary | ICD-10-CM | POA: Diagnosis not present

## 2023-09-11 DIAGNOSIS — R1312 Dysphagia, oropharyngeal phase: Secondary | ICD-10-CM | POA: Diagnosis not present

## 2023-09-11 DIAGNOSIS — R278 Other lack of coordination: Secondary | ICD-10-CM | POA: Diagnosis not present

## 2023-09-11 DIAGNOSIS — M069 Rheumatoid arthritis, unspecified: Secondary | ICD-10-CM | POA: Diagnosis not present

## 2023-09-11 DIAGNOSIS — E119 Type 2 diabetes mellitus without complications: Secondary | ICD-10-CM | POA: Diagnosis not present

## 2023-09-11 DIAGNOSIS — E785 Hyperlipidemia, unspecified: Secondary | ICD-10-CM | POA: Diagnosis not present

## 2023-09-12 DIAGNOSIS — R1312 Dysphagia, oropharyngeal phase: Secondary | ICD-10-CM | POA: Diagnosis not present

## 2023-09-12 DIAGNOSIS — N390 Urinary tract infection, site not specified: Secondary | ICD-10-CM | POA: Diagnosis not present

## 2023-09-12 DIAGNOSIS — R278 Other lack of coordination: Secondary | ICD-10-CM | POA: Diagnosis not present

## 2023-09-12 DIAGNOSIS — M069 Rheumatoid arthritis, unspecified: Secondary | ICD-10-CM | POA: Diagnosis not present

## 2023-09-13 DIAGNOSIS — R1312 Dysphagia, oropharyngeal phase: Secondary | ICD-10-CM | POA: Diagnosis not present

## 2023-09-13 DIAGNOSIS — R278 Other lack of coordination: Secondary | ICD-10-CM | POA: Diagnosis not present

## 2023-09-13 DIAGNOSIS — M069 Rheumatoid arthritis, unspecified: Secondary | ICD-10-CM | POA: Diagnosis not present

## 2023-09-14 DIAGNOSIS — R278 Other lack of coordination: Secondary | ICD-10-CM | POA: Diagnosis not present

## 2023-09-14 DIAGNOSIS — M069 Rheumatoid arthritis, unspecified: Secondary | ICD-10-CM | POA: Diagnosis not present

## 2023-09-14 DIAGNOSIS — R1312 Dysphagia, oropharyngeal phase: Secondary | ICD-10-CM | POA: Diagnosis not present

## 2023-09-16 DIAGNOSIS — R278 Other lack of coordination: Secondary | ICD-10-CM | POA: Diagnosis not present

## 2023-09-16 DIAGNOSIS — M069 Rheumatoid arthritis, unspecified: Secondary | ICD-10-CM | POA: Diagnosis not present

## 2023-09-16 DIAGNOSIS — N390 Urinary tract infection, site not specified: Secondary | ICD-10-CM | POA: Diagnosis not present

## 2023-09-16 DIAGNOSIS — R1312 Dysphagia, oropharyngeal phase: Secondary | ICD-10-CM | POA: Diagnosis not present

## 2023-09-17 DIAGNOSIS — R1312 Dysphagia, oropharyngeal phase: Secondary | ICD-10-CM | POA: Diagnosis not present

## 2023-09-17 DIAGNOSIS — R278 Other lack of coordination: Secondary | ICD-10-CM | POA: Diagnosis not present

## 2023-09-17 DIAGNOSIS — M069 Rheumatoid arthritis, unspecified: Secondary | ICD-10-CM | POA: Diagnosis not present

## 2023-09-18 DIAGNOSIS — M069 Rheumatoid arthritis, unspecified: Secondary | ICD-10-CM | POA: Diagnosis not present

## 2023-09-18 DIAGNOSIS — R1312 Dysphagia, oropharyngeal phase: Secondary | ICD-10-CM | POA: Diagnosis not present

## 2023-09-18 DIAGNOSIS — R278 Other lack of coordination: Secondary | ICD-10-CM | POA: Diagnosis not present

## 2023-09-19 DIAGNOSIS — R531 Weakness: Secondary | ICD-10-CM | POA: Diagnosis not present

## 2023-09-19 DIAGNOSIS — M25562 Pain in left knee: Secondary | ICD-10-CM | POA: Diagnosis not present

## 2023-09-19 DIAGNOSIS — N390 Urinary tract infection, site not specified: Secondary | ICD-10-CM | POA: Diagnosis not present

## 2023-09-20 DIAGNOSIS — R278 Other lack of coordination: Secondary | ICD-10-CM | POA: Diagnosis not present

## 2023-09-20 DIAGNOSIS — R1312 Dysphagia, oropharyngeal phase: Secondary | ICD-10-CM | POA: Diagnosis not present

## 2023-09-20 DIAGNOSIS — M069 Rheumatoid arthritis, unspecified: Secondary | ICD-10-CM | POA: Diagnosis not present

## 2023-09-21 DIAGNOSIS — M069 Rheumatoid arthritis, unspecified: Secondary | ICD-10-CM | POA: Diagnosis not present

## 2023-09-21 DIAGNOSIS — R1312 Dysphagia, oropharyngeal phase: Secondary | ICD-10-CM | POA: Diagnosis not present

## 2023-09-21 DIAGNOSIS — R278 Other lack of coordination: Secondary | ICD-10-CM | POA: Diagnosis not present

## 2023-09-22 DIAGNOSIS — R1312 Dysphagia, oropharyngeal phase: Secondary | ICD-10-CM | POA: Diagnosis not present

## 2023-09-22 DIAGNOSIS — M069 Rheumatoid arthritis, unspecified: Secondary | ICD-10-CM | POA: Diagnosis not present

## 2023-09-22 DIAGNOSIS — R278 Other lack of coordination: Secondary | ICD-10-CM | POA: Diagnosis not present

## 2023-09-23 DIAGNOSIS — R531 Weakness: Secondary | ICD-10-CM | POA: Diagnosis not present

## 2023-09-23 DIAGNOSIS — R1312 Dysphagia, oropharyngeal phase: Secondary | ICD-10-CM | POA: Diagnosis not present

## 2023-09-23 DIAGNOSIS — M069 Rheumatoid arthritis, unspecified: Secondary | ICD-10-CM | POA: Diagnosis not present

## 2023-09-23 DIAGNOSIS — R278 Other lack of coordination: Secondary | ICD-10-CM | POA: Diagnosis not present

## 2023-09-24 DIAGNOSIS — R278 Other lack of coordination: Secondary | ICD-10-CM | POA: Diagnosis not present

## 2023-09-24 DIAGNOSIS — M069 Rheumatoid arthritis, unspecified: Secondary | ICD-10-CM | POA: Diagnosis not present

## 2023-09-24 DIAGNOSIS — R1312 Dysphagia, oropharyngeal phase: Secondary | ICD-10-CM | POA: Diagnosis not present

## 2023-09-26 DIAGNOSIS — R296 Repeated falls: Secondary | ICD-10-CM | POA: Diagnosis not present

## 2023-09-26 DIAGNOSIS — R52 Pain, unspecified: Secondary | ICD-10-CM | POA: Diagnosis not present

## 2023-09-30 DIAGNOSIS — M069 Rheumatoid arthritis, unspecified: Secondary | ICD-10-CM | POA: Diagnosis not present

## 2023-09-30 DIAGNOSIS — R278 Other lack of coordination: Secondary | ICD-10-CM | POA: Diagnosis not present

## 2023-09-30 DIAGNOSIS — R1312 Dysphagia, oropharyngeal phase: Secondary | ICD-10-CM | POA: Diagnosis not present

## 2023-10-02 DIAGNOSIS — R1312 Dysphagia, oropharyngeal phase: Secondary | ICD-10-CM | POA: Diagnosis not present

## 2023-10-02 DIAGNOSIS — R278 Other lack of coordination: Secondary | ICD-10-CM | POA: Diagnosis not present

## 2023-10-02 DIAGNOSIS — M069 Rheumatoid arthritis, unspecified: Secondary | ICD-10-CM | POA: Diagnosis not present

## 2023-10-03 DIAGNOSIS — R1312 Dysphagia, oropharyngeal phase: Secondary | ICD-10-CM | POA: Diagnosis not present

## 2023-10-03 DIAGNOSIS — M069 Rheumatoid arthritis, unspecified: Secondary | ICD-10-CM | POA: Diagnosis not present

## 2023-10-03 DIAGNOSIS — R278 Other lack of coordination: Secondary | ICD-10-CM | POA: Diagnosis not present

## 2023-10-05 DIAGNOSIS — M069 Rheumatoid arthritis, unspecified: Secondary | ICD-10-CM | POA: Diagnosis not present

## 2023-10-05 DIAGNOSIS — R1312 Dysphagia, oropharyngeal phase: Secondary | ICD-10-CM | POA: Diagnosis not present

## 2023-10-05 DIAGNOSIS — R278 Other lack of coordination: Secondary | ICD-10-CM | POA: Diagnosis not present

## 2023-10-07 DIAGNOSIS — R278 Other lack of coordination: Secondary | ICD-10-CM | POA: Diagnosis not present

## 2023-10-07 DIAGNOSIS — R1312 Dysphagia, oropharyngeal phase: Secondary | ICD-10-CM | POA: Diagnosis not present

## 2023-10-07 DIAGNOSIS — M069 Rheumatoid arthritis, unspecified: Secondary | ICD-10-CM | POA: Diagnosis not present

## 2023-10-09 DIAGNOSIS — R1312 Dysphagia, oropharyngeal phase: Secondary | ICD-10-CM | POA: Diagnosis not present

## 2023-10-09 DIAGNOSIS — M069 Rheumatoid arthritis, unspecified: Secondary | ICD-10-CM | POA: Diagnosis not present

## 2023-10-09 DIAGNOSIS — R278 Other lack of coordination: Secondary | ICD-10-CM | POA: Diagnosis not present

## 2023-10-09 DIAGNOSIS — N390 Urinary tract infection, site not specified: Secondary | ICD-10-CM | POA: Diagnosis not present

## 2023-10-09 DIAGNOSIS — E119 Type 2 diabetes mellitus without complications: Secondary | ICD-10-CM | POA: Diagnosis not present

## 2023-10-12 DIAGNOSIS — R278 Other lack of coordination: Secondary | ICD-10-CM | POA: Diagnosis not present

## 2023-10-12 DIAGNOSIS — R1312 Dysphagia, oropharyngeal phase: Secondary | ICD-10-CM | POA: Diagnosis not present

## 2023-10-12 DIAGNOSIS — M069 Rheumatoid arthritis, unspecified: Secondary | ICD-10-CM | POA: Diagnosis not present

## 2023-10-14 DIAGNOSIS — R278 Other lack of coordination: Secondary | ICD-10-CM | POA: Diagnosis not present

## 2023-10-14 DIAGNOSIS — M069 Rheumatoid arthritis, unspecified: Secondary | ICD-10-CM | POA: Diagnosis not present

## 2023-10-14 DIAGNOSIS — R1312 Dysphagia, oropharyngeal phase: Secondary | ICD-10-CM | POA: Diagnosis not present

## 2023-10-16 DIAGNOSIS — R278 Other lack of coordination: Secondary | ICD-10-CM | POA: Diagnosis not present

## 2023-10-16 DIAGNOSIS — R1312 Dysphagia, oropharyngeal phase: Secondary | ICD-10-CM | POA: Diagnosis not present

## 2023-10-16 DIAGNOSIS — M069 Rheumatoid arthritis, unspecified: Secondary | ICD-10-CM | POA: Diagnosis not present

## 2023-10-20 DIAGNOSIS — G4700 Insomnia, unspecified: Secondary | ICD-10-CM | POA: Diagnosis not present

## 2023-10-20 DIAGNOSIS — G2401 Drug induced subacute dyskinesia: Secondary | ICD-10-CM | POA: Diagnosis not present

## 2023-10-21 DIAGNOSIS — M069 Rheumatoid arthritis, unspecified: Secondary | ICD-10-CM | POA: Diagnosis not present

## 2023-10-21 DIAGNOSIS — R1312 Dysphagia, oropharyngeal phase: Secondary | ICD-10-CM | POA: Diagnosis not present

## 2023-10-21 DIAGNOSIS — R278 Other lack of coordination: Secondary | ICD-10-CM | POA: Diagnosis not present

## 2023-10-22 DIAGNOSIS — R1312 Dysphagia, oropharyngeal phase: Secondary | ICD-10-CM | POA: Diagnosis not present

## 2023-10-22 DIAGNOSIS — M069 Rheumatoid arthritis, unspecified: Secondary | ICD-10-CM | POA: Diagnosis not present

## 2023-10-22 DIAGNOSIS — R278 Other lack of coordination: Secondary | ICD-10-CM | POA: Diagnosis not present

## 2023-10-23 DIAGNOSIS — R278 Other lack of coordination: Secondary | ICD-10-CM | POA: Diagnosis not present

## 2023-10-23 DIAGNOSIS — R1312 Dysphagia, oropharyngeal phase: Secondary | ICD-10-CM | POA: Diagnosis not present

## 2023-10-23 DIAGNOSIS — M069 Rheumatoid arthritis, unspecified: Secondary | ICD-10-CM | POA: Diagnosis not present

## 2023-10-26 DIAGNOSIS — R1312 Dysphagia, oropharyngeal phase: Secondary | ICD-10-CM | POA: Diagnosis not present

## 2023-10-26 DIAGNOSIS — R278 Other lack of coordination: Secondary | ICD-10-CM | POA: Diagnosis not present

## 2023-10-26 DIAGNOSIS — M069 Rheumatoid arthritis, unspecified: Secondary | ICD-10-CM | POA: Diagnosis not present

## 2023-10-28 DIAGNOSIS — R1312 Dysphagia, oropharyngeal phase: Secondary | ICD-10-CM | POA: Diagnosis not present

## 2023-10-28 DIAGNOSIS — R278 Other lack of coordination: Secondary | ICD-10-CM | POA: Diagnosis not present

## 2023-10-28 DIAGNOSIS — M069 Rheumatoid arthritis, unspecified: Secondary | ICD-10-CM | POA: Diagnosis not present

## 2023-10-30 DIAGNOSIS — M069 Rheumatoid arthritis, unspecified: Secondary | ICD-10-CM | POA: Diagnosis not present

## 2023-10-30 DIAGNOSIS — R1312 Dysphagia, oropharyngeal phase: Secondary | ICD-10-CM | POA: Diagnosis not present

## 2023-10-30 DIAGNOSIS — R278 Other lack of coordination: Secondary | ICD-10-CM | POA: Diagnosis not present

## 2023-11-02 DIAGNOSIS — R278 Other lack of coordination: Secondary | ICD-10-CM | POA: Diagnosis not present

## 2023-11-02 DIAGNOSIS — R1312 Dysphagia, oropharyngeal phase: Secondary | ICD-10-CM | POA: Diagnosis not present

## 2023-11-02 DIAGNOSIS — M069 Rheumatoid arthritis, unspecified: Secondary | ICD-10-CM | POA: Diagnosis not present

## 2023-11-04 DIAGNOSIS — M069 Rheumatoid arthritis, unspecified: Secondary | ICD-10-CM | POA: Diagnosis not present

## 2023-11-04 DIAGNOSIS — R1312 Dysphagia, oropharyngeal phase: Secondary | ICD-10-CM | POA: Diagnosis not present

## 2023-11-04 DIAGNOSIS — R278 Other lack of coordination: Secondary | ICD-10-CM | POA: Diagnosis not present

## 2023-11-06 DIAGNOSIS — R1312 Dysphagia, oropharyngeal phase: Secondary | ICD-10-CM | POA: Diagnosis not present

## 2023-11-06 DIAGNOSIS — M069 Rheumatoid arthritis, unspecified: Secondary | ICD-10-CM | POA: Diagnosis not present

## 2023-11-06 DIAGNOSIS — R278 Other lack of coordination: Secondary | ICD-10-CM | POA: Diagnosis not present

## 2024-07-04 ENCOUNTER — Other Ambulatory Visit: Payer: Self-pay

## 2024-07-04 ENCOUNTER — Emergency Department
Admission: EM | Admit: 2024-07-04 | Discharge: 2024-07-04 | Disposition: A | Discharge status: Skilled Nursing Facility | Attending: Emergency Medicine | Admitting: Emergency Medicine

## 2024-07-04 ENCOUNTER — Emergency Department

## 2024-07-04 DIAGNOSIS — M25561 Pain in right knee: Secondary | ICD-10-CM | POA: Diagnosis present

## 2024-07-04 DIAGNOSIS — M1711 Unilateral primary osteoarthritis, right knee: Secondary | ICD-10-CM | POA: Insufficient documentation

## 2024-07-04 HISTORY — DX: Bed confinement status: Z74.01

## 2024-07-04 MED ORDER — ACETAMINOPHEN 500 MG PO TABS
1000.0000 mg | ORAL_TABLET | Freq: Once | ORAL | Status: AC
  Administered 2024-07-04: 1000 mg via ORAL
  Filled 2024-07-04: qty 2

## 2024-07-04 NOTE — ED Provider Notes (Signed)
   [facility] Provider Note    Event Date/Time   First MD Initiated Contact with Patient 07/04/24 [phone]     (approximate)   History   Knee Pain  HPI  Lauren [name] is a 73 y.o. female who presents with complaints of chronic right knee pain which seems to have worsened in the last several months.  She reports she is bedbound.  No falls or injury, no redness or fever     Physical Exam   Triage Vital Signs: ED Triage Vitals  Encounter Vitals Group     BP 07/04/24 0854 (!) 155/120     Girls Systolic BP Percentile --      Girls Diastolic BP Percentile --      Boys Systolic BP Percentile --      Boys Diastolic BP Percentile --      Pulse Rate 07/04/24 0854 73     Resp 07/04/24 0854 20     Temp 07/04/24 0854 98 F (36.7 C)     Temp Source 07/04/24 0854 Oral     SpO2 07/04/24 0854 100 %     Weight 07/04/24 0855 99.1 kg (218 lb 8 oz)     Height 07/04/24 0855 1.575 m (5' 2)     Head Circumference --      Peak Flow --      Pain Score 07/04/24 0851 8     Pain Loc --      Pain Education --      Exclude from Growth Chart --     Most recent vital signs: Vitals:   07/04/24 0854 07/04/24 0927  BP: (!) 155/120   Pulse: 73   Resp: 20   Temp: 98 F (36.7 C)   SpO2: 100% 100%     General: Awake, no distress.  CV:  Good peripheral perfusion.  Resp:  Normal effort.  Abd:  No distention.  Other:  Right knee: No erythema, mild effusion suspected no signs of infection   ED Results / Procedures / Treatments   Labs (all labs ordered are listed, but only abnormal results are displayed) Labs Reviewed - No data to display   EKG     RADIOLOGY X-ray consistent with severe arthritis, viewed interpreted by me    PROCEDURES:  Critical Care performed:   Procedures   MEDICATIONS ORDERED IN ED: Medications  acetaminophen  (TYLENOL ) tablet 1,000 mg (1,000 mg Oral Given 07/04/24 0909)     IMPRESSION / MDM / ASSESSMENT AND PLAN / ED  COURSE  I reviewed the triage vital signs and the nursing notes. Patient's presentation is most consistent with acute complicated illness / injury requiring diagnostic workup.   Patient presents with right knee pain as detailed above, differential includes arthritis, sprain, exam is not consistent with infection or gout  X-ray consistent with severe arthritis, will refer the patient to orthopedics for further treatment options.       FINAL CLINICAL IMPRESSION(S) / ED DIAGNOSES   Final diagnoses:  Osteoarthritis of right knee, unspecified osteoarthritis type     Rx / DC Orders   ED Discharge Orders     None        Note:  This document was prepared using Dragon voice recognition software and may include unintentional dictation errors.   Arlander Charleston, MD 07/04/24 1031

## 2024-07-04 NOTE — ED Triage Notes (Signed)
 Pt to ED from [facility] for R knee pain since years, hx knee surgery  EMS VS: 200/100, other VSS  Alert oriented on RA  Pt does not walk, is bedbound per EMS. Just having knee pain

## 2024-07-04 NOTE — ED Notes (Signed)
 Pt assisted with toileting and placed on bedpan. Pt urinated and had a bowel movement and was cleaned with cleansing wipes.

## 2024-07-04 NOTE — Discharge Instructions (Addendum)
 Patient's xray demonstrates significant arthritis. She would benefit from follow up with orthopedics
